# Patient Record
Sex: Female | Born: 1955
Health system: Southern US, Community
[De-identification: ages and names within clinical notes are randomized; demographics above are authoritative.]

## PROBLEM LIST (undated history)

## (undated) DIAGNOSIS — R350 Frequency of micturition: Secondary | ICD-10-CM

## (undated) DIAGNOSIS — M797 Fibromyalgia: Secondary | ICD-10-CM

## (undated) DIAGNOSIS — I499 Cardiac arrhythmia, unspecified: Secondary | ICD-10-CM

## (undated) DIAGNOSIS — G4730 Sleep apnea, unspecified: Secondary | ICD-10-CM

## (undated) DIAGNOSIS — Z860100 Personal history of colon polyps, unspecified: Secondary | ICD-10-CM

## (undated) DIAGNOSIS — N182 Chronic kidney disease, stage 2 (mild): Secondary | ICD-10-CM

## (undated) DIAGNOSIS — M2550 Pain in unspecified joint: Secondary | ICD-10-CM

## (undated) DIAGNOSIS — T148XXA Other injury of unspecified body region, initial encounter: Secondary | ICD-10-CM

## (undated) DIAGNOSIS — Z9884 Bariatric surgery status: Secondary | ICD-10-CM

## (undated) DIAGNOSIS — F419 Anxiety disorder, unspecified: Secondary | ICD-10-CM

## (undated) DIAGNOSIS — I509 Heart failure, unspecified: Secondary | ICD-10-CM

## (undated) DIAGNOSIS — G4700 Insomnia, unspecified: Secondary | ICD-10-CM

## (undated) DIAGNOSIS — Z8709 Personal history of other diseases of the respiratory system: Secondary | ICD-10-CM

## (undated) DIAGNOSIS — E119 Type 2 diabetes mellitus without complications: Secondary | ICD-10-CM

## (undated) DIAGNOSIS — M1990 Unspecified osteoarthritis, unspecified site: Secondary | ICD-10-CM

## (undated) DIAGNOSIS — G629 Polyneuropathy, unspecified: Secondary | ICD-10-CM

## (undated) DIAGNOSIS — M6289 Other specified disorders of muscle: Secondary | ICD-10-CM

## (undated) DIAGNOSIS — T7840XA Allergy, unspecified, initial encounter: Secondary | ICD-10-CM

## (undated) DIAGNOSIS — R011 Cardiac murmur, unspecified: Secondary | ICD-10-CM

## (undated) DIAGNOSIS — D649 Anemia, unspecified: Secondary | ICD-10-CM

## (undated) DIAGNOSIS — M549 Dorsalgia, unspecified: Secondary | ICD-10-CM

## (undated) DIAGNOSIS — M2540 Effusion, unspecified joint: Secondary | ICD-10-CM

## (undated) DIAGNOSIS — K8020 Calculus of gallbladder without cholecystitis without obstruction: Secondary | ICD-10-CM

## (undated) DIAGNOSIS — F329 Major depressive disorder, single episode, unspecified: Secondary | ICD-10-CM

## (undated) DIAGNOSIS — J45909 Unspecified asthma, uncomplicated: Secondary | ICD-10-CM

## (undated) DIAGNOSIS — F32A Depression, unspecified: Secondary | ICD-10-CM

## (undated) DIAGNOSIS — R3915 Urgency of urination: Secondary | ICD-10-CM

## (undated) DIAGNOSIS — T4145XA Adverse effect of unspecified anesthetic, initial encounter: Secondary | ICD-10-CM

## (undated) DIAGNOSIS — I10 Essential (primary) hypertension: Secondary | ICD-10-CM

## (undated) DIAGNOSIS — E559 Vitamin D deficiency, unspecified: Secondary | ICD-10-CM

## (undated) DIAGNOSIS — T8859XA Other complications of anesthesia, initial encounter: Secondary | ICD-10-CM

## (undated) DIAGNOSIS — K219 Gastro-esophageal reflux disease without esophagitis: Secondary | ICD-10-CM

## (undated) DIAGNOSIS — J189 Pneumonia, unspecified organism: Secondary | ICD-10-CM

## (undated) DIAGNOSIS — R809 Proteinuria, unspecified: Secondary | ICD-10-CM

## (undated) DIAGNOSIS — I82409 Acute embolism and thrombosis of unspecified deep veins of unspecified lower extremity: Secondary | ICD-10-CM

## (undated) DIAGNOSIS — G8929 Other chronic pain: Secondary | ICD-10-CM

## (undated) HISTORY — DX: Essential (primary) hypertension: I10

## (undated) HISTORY — DX: Vitamin D deficiency, unspecified: E55.9

## (undated) HISTORY — DX: Type 2 diabetes mellitus without complications: E11.9

## (undated) HISTORY — DX: Fibromyalgia: M79.7

## (undated) HISTORY — DX: Anxiety disorder, unspecified: F41.9

## (undated) HISTORY — DX: Calculus of gallbladder without cholecystitis without obstruction: K80.20

## (undated) HISTORY — PX: TONSILLECTOMY: SUR1361

## (undated) HISTORY — DX: Depression, unspecified: F32.A

## (undated) HISTORY — DX: Allergy, unspecified, initial encounter: T78.40XA

## (undated) HISTORY — DX: Proteinuria, unspecified: R80.9

## (undated) HISTORY — DX: Bariatric surgery status: Z98.84

## (undated) HISTORY — PX: FRACTURE SURGERY: SHX138

## (undated) HISTORY — DX: Other specified disorders of muscle: M62.89

## (undated) HISTORY — PX: SPINE SURGERY: SHX786

## (undated) HISTORY — PX: COLONOSCOPY: SHX174

## (undated) HISTORY — PX: DILATION AND CURETTAGE OF UTERUS: SHX78

## (undated) HISTORY — DX: Major depressive disorder, single episode, unspecified: F32.9

## (undated) HISTORY — DX: Chronic kidney disease, stage 2 (mild): N18.2

---

## 1985-04-19 HISTORY — PX: LIGAMENT REPAIR: SHX5444

## 1998-02-24 ENCOUNTER — Encounter: Payer: Self-pay | Admitting: Internal Medicine

## 1998-02-24 ENCOUNTER — Ambulatory Visit (HOSPITAL_COMMUNITY): Admission: RE | Admit: 1998-02-24 | Discharge: 1998-02-24 | Payer: Self-pay | Admitting: Internal Medicine

## 1998-04-04 ENCOUNTER — Encounter: Payer: Self-pay | Admitting: Obstetrics and Gynecology

## 1998-04-08 ENCOUNTER — Ambulatory Visit (HOSPITAL_COMMUNITY): Admission: RE | Admit: 1998-04-08 | Discharge: 1998-04-08 | Payer: Self-pay | Admitting: Obstetrics and Gynecology

## 1998-04-08 ENCOUNTER — Encounter: Payer: Self-pay | Admitting: Obstetrics and Gynecology

## 1999-11-18 ENCOUNTER — Other Ambulatory Visit: Admission: RE | Admit: 1999-11-18 | Discharge: 1999-11-18 | Payer: Self-pay | Admitting: Obstetrics and Gynecology

## 2000-01-08 ENCOUNTER — Ambulatory Visit: Admission: RE | Admit: 2000-01-08 | Discharge: 2000-01-08 | Payer: Self-pay | Admitting: Internal Medicine

## 2000-03-03 ENCOUNTER — Ambulatory Visit (HOSPITAL_COMMUNITY): Admission: RE | Admit: 2000-03-03 | Discharge: 2000-03-03 | Payer: Self-pay | Admitting: Internal Medicine

## 2000-03-05 ENCOUNTER — Emergency Department (HOSPITAL_COMMUNITY): Admission: EM | Admit: 2000-03-05 | Discharge: 2000-03-05 | Payer: Self-pay | Admitting: Emergency Medicine

## 2000-03-05 ENCOUNTER — Encounter: Payer: Self-pay | Admitting: Emergency Medicine

## 2000-05-20 ENCOUNTER — Other Ambulatory Visit: Admission: RE | Admit: 2000-05-20 | Discharge: 2000-05-20 | Payer: Self-pay | Admitting: Obstetrics and Gynecology

## 2000-08-15 ENCOUNTER — Ambulatory Visit (HOSPITAL_BASED_OUTPATIENT_CLINIC_OR_DEPARTMENT_OTHER): Admission: RE | Admit: 2000-08-15 | Discharge: 2000-08-15 | Payer: Self-pay | Admitting: Internal Medicine

## 2000-12-10 ENCOUNTER — Emergency Department (HOSPITAL_COMMUNITY): Admission: EM | Admit: 2000-12-10 | Discharge: 2000-12-11 | Payer: Self-pay | Admitting: Emergency Medicine

## 2000-12-10 ENCOUNTER — Encounter: Payer: Self-pay | Admitting: Emergency Medicine

## 2001-01-26 ENCOUNTER — Other Ambulatory Visit: Admission: RE | Admit: 2001-01-26 | Discharge: 2001-01-26 | Payer: Self-pay | Admitting: Internal Medicine

## 2001-03-10 ENCOUNTER — Encounter: Admission: RE | Admit: 2001-03-10 | Discharge: 2001-06-08 | Payer: Self-pay | Admitting: Internal Medicine

## 2001-04-19 DIAGNOSIS — Z9884 Bariatric surgery status: Secondary | ICD-10-CM

## 2001-04-19 HISTORY — DX: Bariatric surgery status: Z98.84

## 2001-06-27 ENCOUNTER — Ambulatory Visit: Admission: RE | Admit: 2001-06-27 | Discharge: 2001-06-27 | Payer: Self-pay | Admitting: Internal Medicine

## 2001-08-11 ENCOUNTER — Ambulatory Visit (HOSPITAL_COMMUNITY): Admission: RE | Admit: 2001-08-11 | Discharge: 2001-08-11 | Payer: Self-pay | Admitting: *Deleted

## 2001-08-14 ENCOUNTER — Ambulatory Visit (HOSPITAL_COMMUNITY): Admission: RE | Admit: 2001-08-14 | Discharge: 2001-08-14 | Payer: Self-pay | Admitting: *Deleted

## 2001-08-22 ENCOUNTER — Encounter: Payer: Self-pay | Admitting: Gastroenterology

## 2001-08-22 ENCOUNTER — Encounter: Admission: RE | Admit: 2001-08-22 | Discharge: 2001-08-22 | Payer: Self-pay | Admitting: Gastroenterology

## 2001-09-27 ENCOUNTER — Observation Stay (HOSPITAL_COMMUNITY): Admission: RE | Admit: 2001-09-27 | Discharge: 2001-09-28 | Payer: Self-pay | Admitting: *Deleted

## 2001-09-27 ENCOUNTER — Encounter (INDEPENDENT_AMBULATORY_CARE_PROVIDER_SITE_OTHER): Payer: Self-pay | Admitting: Specialist

## 2001-10-24 ENCOUNTER — Encounter: Payer: Self-pay | Admitting: General Surgery

## 2001-10-24 ENCOUNTER — Emergency Department (HOSPITAL_COMMUNITY): Admission: EM | Admit: 2001-10-24 | Discharge: 2001-10-24 | Payer: Self-pay | Admitting: Emergency Medicine

## 2002-04-19 HISTORY — PX: OTHER SURGICAL HISTORY: SHX169

## 2003-04-20 HISTORY — PX: HERNIA REPAIR: SHX51

## 2003-10-20 ENCOUNTER — Emergency Department (HOSPITAL_COMMUNITY): Admission: EM | Admit: 2003-10-20 | Discharge: 2003-10-20 | Payer: Self-pay | Admitting: Emergency Medicine

## 2004-08-14 ENCOUNTER — Ambulatory Visit: Payer: Self-pay | Admitting: Internal Medicine

## 2005-02-16 ENCOUNTER — Ambulatory Visit: Payer: Self-pay | Admitting: Internal Medicine

## 2005-03-16 ENCOUNTER — Ambulatory Visit: Payer: Self-pay | Admitting: Internal Medicine

## 2005-03-18 ENCOUNTER — Ambulatory Visit: Payer: Self-pay | Admitting: Cardiology

## 2005-04-19 HISTORY — PX: CHOLECYSTECTOMY: SHX55

## 2006-02-11 ENCOUNTER — Emergency Department (HOSPITAL_COMMUNITY): Admission: EM | Admit: 2006-02-11 | Discharge: 2006-02-11 | Payer: Self-pay | Admitting: Emergency Medicine

## 2006-02-18 ENCOUNTER — Ambulatory Visit: Payer: Self-pay | Admitting: Internal Medicine

## 2006-02-18 LAB — CONVERTED CEMR LAB
ALT: 31 units/L (ref 0–40)
AST: 24 units/L (ref 0–37)
Albumin: 3.4 g/dL — ABNORMAL LOW (ref 3.5–5.2)
Alkaline Phosphatase: 93 units/L (ref 39–117)
BUN: 6 mg/dL (ref 6–23)
Basophils Absolute: 0 10*3/uL (ref 0.0–0.1)
Basophils Relative: 0.3 % (ref 0.0–1.0)
Bilirubin, Direct: 0.1 mg/dL (ref 0.0–0.3)
CO2: 25 meq/L (ref 19–32)
Calcium: 8.1 mg/dL — ABNORMAL LOW (ref 8.4–10.5)
Chloride: 110 meq/L (ref 96–112)
Chol/HDL Ratio, serum: 2.2
Cholesterol: 126 mg/dL (ref 0–200)
Creatinine, Ser: 0.6 mg/dL (ref 0.4–1.2)
Eosinophil percent: 2.6 % (ref 0.0–5.0)
GFR calc non Af Amer: 112 mL/min
Glomerular Filtration Rate, Af Am: 135 mL/min/{1.73_m2}
Glucose, Bld: 87 mg/dL (ref 70–99)
HCT: 41.6 % (ref 36.0–46.0)
HDL: 56.9 mg/dL (ref >39.0)
Hemoglobin: 13.6 g/dL (ref 12.0–15.0)
LDL Cholesterol: 55 mg/dL (ref 0–99)
Lymphocytes Relative: 36.5 % (ref 12.0–46.0)
MCHC: 32.8 g/dL (ref 30.0–36.0)
MCV: 86.5 fL (ref 78.0–100.0)
Monocytes Absolute: 0.5 10*3/uL (ref 0.2–0.7)
Monocytes Relative: 8.8 % (ref 3.0–11.0)
Neutro Abs: 3.1 10*3/uL (ref 1.4–7.7)
Neutrophils Relative %: 51.8 % (ref 43.0–77.0)
Platelets: 263 10*3/uL (ref 150–400)
Potassium: 3.4 meq/L — ABNORMAL LOW (ref 3.5–5.1)
RBC: 4.81 M/uL (ref 3.87–5.11)
RDW: 15.1 % — ABNORMAL HIGH (ref 11.5–14.6)
Sodium: 141 meq/L (ref 135–145)
Total Bilirubin: 0.5 mg/dL (ref 0.3–1.2)
Total Protein: 6.4 g/dL (ref 6.0–8.3)
Triglyceride fasting, serum: 70 mg/dL (ref 0–149)
VLDL: 14 mg/dL (ref 0–40)
WBC: 6 10*3/uL (ref 4.5–10.5)

## 2006-09-05 ENCOUNTER — Ambulatory Visit: Payer: Self-pay | Admitting: Internal Medicine

## 2006-09-06 LAB — CONVERTED CEMR LAB
ALT: 18 units/L (ref 0–40)
AST: 20 units/L (ref 0–37)
Albumin: 3.4 g/dL — ABNORMAL LOW (ref 3.5–5.2)
Alkaline Phosphatase: 145 units/L — ABNORMAL HIGH (ref 39–117)
Amylase: 83 units/L (ref 27–131)
BUN: 8 mg/dL (ref 6–23)
Basophils Absolute: 0 10*3/uL (ref 0.0–0.1)
Basophils Relative: 0.4 % (ref 0.0–1.0)
Bilirubin, Direct: 0.1 mg/dL (ref 0.0–0.3)
CO2: 21 meq/L (ref 19–32)
Calcium: 8 mg/dL — ABNORMAL LOW (ref 8.4–10.5)
Chloride: 116 meq/L — ABNORMAL HIGH (ref 96–112)
Creatinine, Ser: 0.4 mg/dL (ref 0.4–1.2)
Eosinophils Absolute: 0.1 10*3/uL (ref 0.0–0.6)
Eosinophils Relative: 2 % (ref 0.0–5.0)
GFR calc Af Amer: 216 mL/min
GFR calc non Af Amer: 178 mL/min
Glucose, Bld: 73 mg/dL (ref 70–99)
HCT: 36.8 % (ref 36.0–46.0)
Hemoglobin: 12.8 g/dL (ref 12.0–15.0)
Lymphocytes Relative: 40.3 % (ref 12.0–46.0)
MCHC: 34.7 g/dL (ref 30.0–36.0)
MCV: 80.6 fL (ref 78.0–100.0)
Monocytes Absolute: 0.3 10*3/uL (ref 0.2–0.7)
Monocytes Relative: 4.6 % (ref 3.0–11.0)
Neutro Abs: 3.4 10*3/uL (ref 1.4–7.7)
Neutrophils Relative %: 52.7 % (ref 43.0–77.0)
Platelets: 251 10*3/uL (ref 150–400)
Potassium: 3.3 meq/L — ABNORMAL LOW (ref 3.5–5.1)
RBC: 4.57 M/uL (ref 3.87–5.11)
RDW: 15 % — ABNORMAL HIGH (ref 11.5–14.6)
Sodium: 141 meq/L (ref 135–145)
Total Bilirubin: 0.4 mg/dL (ref 0.3–1.2)
Total Protein: 6.1 g/dL (ref 6.0–8.3)
WBC: 6.3 10*3/uL (ref 4.5–10.5)

## 2006-09-08 ENCOUNTER — Encounter: Admission: RE | Admit: 2006-09-08 | Discharge: 2006-09-08 | Payer: Self-pay | Admitting: Gastroenterology

## 2006-09-19 ENCOUNTER — Ambulatory Visit (HOSPITAL_COMMUNITY): Admission: RE | Admit: 2006-09-19 | Discharge: 2006-09-19 | Payer: Self-pay | Admitting: Gastroenterology

## 2006-09-19 ENCOUNTER — Encounter (INDEPENDENT_AMBULATORY_CARE_PROVIDER_SITE_OTHER): Payer: Self-pay | Admitting: Gastroenterology

## 2006-09-22 ENCOUNTER — Encounter: Admission: RE | Admit: 2006-09-22 | Discharge: 2006-09-22 | Payer: Self-pay | Admitting: Gastroenterology

## 2006-10-19 ENCOUNTER — Ambulatory Visit (HOSPITAL_COMMUNITY): Admission: RE | Admit: 2006-10-19 | Discharge: 2006-10-19 | Payer: Self-pay | Admitting: Gastroenterology

## 2006-10-20 DIAGNOSIS — F411 Generalized anxiety disorder: Secondary | ICD-10-CM | POA: Insufficient documentation

## 2006-10-20 DIAGNOSIS — J45909 Unspecified asthma, uncomplicated: Secondary | ICD-10-CM | POA: Insufficient documentation

## 2006-10-20 DIAGNOSIS — Z8639 Personal history of other endocrine, nutritional and metabolic disease: Secondary | ICD-10-CM | POA: Insufficient documentation

## 2006-10-20 DIAGNOSIS — Z86718 Personal history of other venous thrombosis and embolism: Secondary | ICD-10-CM | POA: Insufficient documentation

## 2006-10-20 DIAGNOSIS — F32A Depression, unspecified: Secondary | ICD-10-CM | POA: Insufficient documentation

## 2006-10-20 DIAGNOSIS — M1990 Unspecified osteoarthritis, unspecified site: Secondary | ICD-10-CM | POA: Insufficient documentation

## 2006-10-20 DIAGNOSIS — G4733 Obstructive sleep apnea (adult) (pediatric): Secondary | ICD-10-CM | POA: Insufficient documentation

## 2006-10-20 DIAGNOSIS — F329 Major depressive disorder, single episode, unspecified: Secondary | ICD-10-CM | POA: Insufficient documentation

## 2006-10-27 ENCOUNTER — Ambulatory Visit: Payer: Self-pay | Admitting: Internal Medicine

## 2006-11-21 ENCOUNTER — Telehealth: Payer: Self-pay | Admitting: Internal Medicine

## 2006-11-22 ENCOUNTER — Telehealth: Payer: Self-pay | Admitting: Internal Medicine

## 2006-12-02 ENCOUNTER — Ambulatory Visit: Payer: Self-pay | Admitting: Internal Medicine

## 2006-12-02 DIAGNOSIS — R6889 Other general symptoms and signs: Secondary | ICD-10-CM | POA: Insufficient documentation

## 2006-12-02 DIAGNOSIS — M79609 Pain in unspecified limb: Secondary | ICD-10-CM | POA: Insufficient documentation

## 2006-12-04 DIAGNOSIS — I10 Essential (primary) hypertension: Secondary | ICD-10-CM | POA: Insufficient documentation

## 2006-12-04 LAB — CONVERTED CEMR LAB
ALT: 25 units/L (ref 0–35)
AST: 27 units/L (ref 0–37)
Albumin: 3.5 g/dL (ref 3.5–5.2)
Alkaline Phosphatase: 131 units/L — ABNORMAL HIGH (ref 39–117)
BUN: 9 mg/dL (ref 6–23)
Basophils Absolute: 0 10*3/uL (ref 0.0–0.1)
Basophils Relative: 0.7 % (ref 0.0–1.0)
Bilirubin, Direct: 0.1 mg/dL (ref 0.0–0.3)
CO2: 25 meq/L (ref 19–32)
Calcium: 8.1 mg/dL — ABNORMAL LOW (ref 8.4–10.5)
Chloride: 115 meq/L — ABNORMAL HIGH (ref 96–112)
Creatinine, Ser: 0.5 mg/dL (ref 0.4–1.2)
Eosinophils Absolute: 0.1 10*3/uL (ref 0.0–0.6)
Eosinophils Relative: 1.2 % (ref 0.0–5.0)
GFR calc Af Amer: 166 mL/min
GFR calc non Af Amer: 137 mL/min
Glucose, Bld: 81 mg/dL (ref 70–99)
HCT: 36.8 % (ref 36.0–46.0)
Hemoglobin: 12.8 g/dL (ref 12.0–15.0)
Lymphocytes Relative: 33.9 % (ref 12.0–46.0)
MCHC: 34.6 g/dL (ref 30.0–36.0)
MCV: 81 fL (ref 78.0–100.0)
Monocytes Absolute: 0.5 10*3/uL (ref 0.2–0.7)
Monocytes Relative: 7.2 % (ref 3.0–11.0)
Neutro Abs: 3.8 10*3/uL (ref 1.4–7.7)
Neutrophils Relative %: 57 % (ref 43.0–77.0)
Platelets: 266 10*3/uL (ref 150–400)
Potassium: 3.6 meq/L (ref 3.5–5.1)
RBC: 4.55 M/uL (ref 3.87–5.11)
RDW: 16.3 % — ABNORMAL HIGH (ref 11.5–14.6)
Sodium: 144 meq/L (ref 135–145)
TSH: 0.82 microintl units/mL (ref 0.35–5.50)
Total Bilirubin: 0.5 mg/dL (ref 0.3–1.2)
Total Protein: 6.2 g/dL (ref 6.0–8.3)
WBC: 6.6 10*3/uL (ref 4.5–10.5)

## 2006-12-05 ENCOUNTER — Telehealth: Payer: Self-pay | Admitting: Internal Medicine

## 2006-12-07 ENCOUNTER — Encounter: Payer: Self-pay | Admitting: Internal Medicine

## 2006-12-07 ENCOUNTER — Ambulatory Visit: Payer: Self-pay

## 2006-12-28 ENCOUNTER — Ambulatory Visit: Payer: Self-pay | Admitting: Internal Medicine

## 2006-12-28 DIAGNOSIS — D235 Other benign neoplasm of skin of trunk: Secondary | ICD-10-CM | POA: Insufficient documentation

## 2007-01-11 ENCOUNTER — Ambulatory Visit: Payer: Self-pay | Admitting: Internal Medicine

## 2007-02-01 ENCOUNTER — Ambulatory Visit: Payer: Self-pay | Admitting: Internal Medicine

## 2007-03-06 ENCOUNTER — Telehealth: Payer: Self-pay | Admitting: Internal Medicine

## 2007-03-07 ENCOUNTER — Ambulatory Visit: Payer: Self-pay | Admitting: Internal Medicine

## 2007-03-07 DIAGNOSIS — R0609 Other forms of dyspnea: Secondary | ICD-10-CM | POA: Insufficient documentation

## 2007-03-07 DIAGNOSIS — R0989 Other specified symptoms and signs involving the circulatory and respiratory systems: Secondary | ICD-10-CM

## 2007-03-10 ENCOUNTER — Telehealth (INDEPENDENT_AMBULATORY_CARE_PROVIDER_SITE_OTHER): Payer: Self-pay | Admitting: *Deleted

## 2007-03-14 ENCOUNTER — Ambulatory Visit: Payer: Self-pay | Admitting: Internal Medicine

## 2007-03-14 ENCOUNTER — Encounter: Payer: Self-pay | Admitting: Internal Medicine

## 2007-03-14 ENCOUNTER — Ambulatory Visit: Payer: Self-pay

## 2007-07-25 ENCOUNTER — Ambulatory Visit: Payer: Self-pay | Admitting: Internal Medicine

## 2007-07-25 LAB — CONVERTED CEMR LAB
Ketones, urine, test strip: NEGATIVE
Nitrite: NEGATIVE
Urobilinogen, UA: 0.2
WBC Urine, dipstick: NEGATIVE

## 2007-10-12 ENCOUNTER — Ambulatory Visit: Payer: Self-pay | Admitting: Internal Medicine

## 2007-10-12 DIAGNOSIS — J069 Acute upper respiratory infection, unspecified: Secondary | ICD-10-CM | POA: Insufficient documentation

## 2007-11-27 ENCOUNTER — Emergency Department (HOSPITAL_COMMUNITY): Admission: EM | Admit: 2007-11-27 | Discharge: 2007-11-28 | Payer: Self-pay | Admitting: Emergency Medicine

## 2008-12-29 ENCOUNTER — Emergency Department (HOSPITAL_COMMUNITY): Admission: EM | Admit: 2008-12-29 | Discharge: 2008-12-29 | Payer: Self-pay | Admitting: Emergency Medicine

## 2009-04-07 ENCOUNTER — Encounter: Admission: RE | Admit: 2009-04-07 | Discharge: 2009-04-07 | Payer: Self-pay | Admitting: Cardiology

## 2009-04-19 LAB — HM COLONOSCOPY: HM Colonoscopy: NORMAL

## 2009-06-19 ENCOUNTER — Emergency Department (HOSPITAL_COMMUNITY): Admission: EM | Admit: 2009-06-19 | Discharge: 2009-06-19 | Payer: Self-pay | Admitting: Emergency Medicine

## 2009-09-04 ENCOUNTER — Emergency Department (HOSPITAL_COMMUNITY): Admission: EM | Admit: 2009-09-04 | Discharge: 2009-09-04 | Payer: Self-pay | Admitting: Family Medicine

## 2009-11-12 ENCOUNTER — Emergency Department (HOSPITAL_COMMUNITY): Admission: EM | Admit: 2009-11-12 | Discharge: 2009-11-12 | Payer: Self-pay | Admitting: Family Medicine

## 2010-02-17 ENCOUNTER — Emergency Department (HOSPITAL_COMMUNITY)
Admission: EM | Admit: 2010-02-17 | Discharge: 2010-02-17 | Payer: Self-pay | Source: Home / Self Care | Admitting: Family Medicine

## 2010-02-23 ENCOUNTER — Ambulatory Visit (HOSPITAL_COMMUNITY): Admission: RE | Admit: 2010-02-23 | Discharge: 2010-02-23 | Payer: Self-pay | Admitting: Family Medicine

## 2010-04-05 ENCOUNTER — Emergency Department (HOSPITAL_COMMUNITY)
Admission: EM | Admit: 2010-04-05 | Discharge: 2010-04-05 | Payer: Self-pay | Source: Home / Self Care | Admitting: Emergency Medicine

## 2010-04-24 ENCOUNTER — Encounter
Admission: RE | Admit: 2010-04-24 | Discharge: 2010-04-24 | Payer: Self-pay | Source: Home / Self Care | Attending: Neurosurgery | Admitting: Neurosurgery

## 2010-06-05 ENCOUNTER — Other Ambulatory Visit (HOSPITAL_COMMUNITY): Payer: Self-pay | Admitting: Family Medicine

## 2010-06-05 DIAGNOSIS — R531 Weakness: Secondary | ICD-10-CM

## 2010-06-05 DIAGNOSIS — R159 Full incontinence of feces: Secondary | ICD-10-CM

## 2010-06-10 ENCOUNTER — Encounter: Payer: Self-pay | Admitting: Family Medicine

## 2010-06-10 ENCOUNTER — Observation Stay (HOSPITAL_COMMUNITY)
Admission: EM | Admit: 2010-06-10 | Discharge: 2010-06-11 | Disposition: A | Discharge status: Home / Self Care | Payer: Medicaid Other | Attending: Family Medicine | Admitting: Family Medicine

## 2010-06-10 ENCOUNTER — Emergency Department (HOSPITAL_COMMUNITY): Payer: Medicaid Other

## 2010-06-10 DIAGNOSIS — R29898 Other symptoms and signs involving the musculoskeletal system: Secondary | ICD-10-CM | POA: Insufficient documentation

## 2010-06-10 DIAGNOSIS — I10 Essential (primary) hypertension: Secondary | ICD-10-CM

## 2010-06-10 DIAGNOSIS — E876 Hypokalemia: Secondary | ICD-10-CM | POA: Insufficient documentation

## 2010-06-10 DIAGNOSIS — R0789 Other chest pain: Secondary | ICD-10-CM

## 2010-06-10 DIAGNOSIS — D649 Anemia, unspecified: Secondary | ICD-10-CM | POA: Insufficient documentation

## 2010-06-10 DIAGNOSIS — R0989 Other specified symptoms and signs involving the circulatory and respiratory systems: Secondary | ICD-10-CM | POA: Insufficient documentation

## 2010-06-10 DIAGNOSIS — F3289 Other specified depressive episodes: Secondary | ICD-10-CM

## 2010-06-10 DIAGNOSIS — G894 Chronic pain syndrome: Secondary | ICD-10-CM | POA: Insufficient documentation

## 2010-06-10 DIAGNOSIS — D638 Anemia in other chronic diseases classified elsewhere: Secondary | ICD-10-CM

## 2010-06-10 DIAGNOSIS — R0602 Shortness of breath: Secondary | ICD-10-CM | POA: Insufficient documentation

## 2010-06-10 DIAGNOSIS — F329 Major depressive disorder, single episode, unspecified: Secondary | ICD-10-CM | POA: Insufficient documentation

## 2010-06-10 DIAGNOSIS — R0609 Other forms of dyspnea: Secondary | ICD-10-CM | POA: Insufficient documentation

## 2010-06-10 DIAGNOSIS — Z86718 Personal history of other venous thrombosis and embolism: Secondary | ICD-10-CM | POA: Insufficient documentation

## 2010-06-10 LAB — DIFFERENTIAL
Basophils Absolute: 0 10*3/uL (ref 0.0–0.1)
Basophils Relative: 0 % (ref 0–1)
Eosinophils Absolute: 0.1 10*3/uL (ref 0.0–0.7)
Eosinophils Relative: 2 % (ref 0–5)
Neutrophils Relative %: 41 % — ABNORMAL LOW (ref 43–77)

## 2010-06-10 LAB — CBC
MCV: 84.8 fL (ref 78.0–100.0)
Platelets: 265 10*3/uL (ref 150–400)
RBC: 3.81 MIL/uL — ABNORMAL LOW (ref 3.87–5.11)
RDW: 16.7 % — ABNORMAL HIGH (ref 11.5–15.5)
WBC: 5 10*3/uL (ref 4.0–10.5)

## 2010-06-10 LAB — POCT CARDIAC MARKERS
CKMB, poc: <1 ng/mL — ABNORMAL LOW (ref 1.0–8.0)
Myoglobin, poc: 43.4 ng/mL (ref 12–200)
Troponin i, poc: <0.05 ng/mL (ref 0.00–0.09)

## 2010-06-10 LAB — CARDIAC PANEL(CRET KIN+CKTOT+MB+TROPI)
CK, MB: 1.2 ng/mL (ref 0.3–4.0)
Total CK: 62 U/L (ref 7–177)
Troponin I: <0.01 ng/mL (ref 0.00–0.06)

## 2010-06-10 LAB — BASIC METABOLIC PANEL
Chloride: 117 mEq/L — ABNORMAL HIGH (ref 96–112)
GFR calc non Af Amer: >60 mL/min (ref >60)
Glucose, Bld: 90 mg/dL (ref 70–99)
Potassium: 3.3 mEq/L — ABNORMAL LOW (ref 3.5–5.1)
Sodium: 144 mEq/L (ref 135–145)

## 2010-06-10 LAB — CARBOXYHEMOGLOBIN
Carboxyhemoglobin: 0.6 % (ref 0.5–1.5)
Total hemoglobin: 10.5 g/dL — ABNORMAL LOW (ref 12.5–16.0)

## 2010-06-10 LAB — APTT: aPTT: 29 seconds (ref 24–37)

## 2010-06-10 LAB — CK TOTAL AND CKMB (NOT AT ARMC): CK, MB: 1.3 ng/mL (ref 0.3–4.0)

## 2010-06-10 LAB — SEDIMENTATION RATE: Sed Rate: 15 mm/hr (ref 0–22)

## 2010-06-10 LAB — TROPONIN I: Troponin I: 0.01 ng/mL (ref 0.00–0.06)

## 2010-06-10 LAB — RETICULOCYTES: Retic Count, Absolute: 38.1 10*3/uL (ref 19.0–186.0)

## 2010-06-11 LAB — LIPID PANEL
Cholesterol: 133 mg/dL (ref 0–200)
LDL Cholesterol: 59 mg/dL (ref 0–99)

## 2010-06-11 LAB — URINE MICROSCOPIC-ADD ON

## 2010-06-11 LAB — URINALYSIS, ROUTINE W REFLEX MICROSCOPIC
Bilirubin Urine: NEGATIVE
Nitrite: NEGATIVE
Specific Gravity, Urine: 1.014 (ref 1.005–1.030)
Urobilinogen, UA: 1 mg/dL (ref 0.0–1.0)

## 2010-06-11 LAB — TSH: TSH: 1.08 u[IU]/mL (ref 0.350–4.500)

## 2010-06-11 LAB — BASIC METABOLIC PANEL
Calcium: 7.5 mg/dL — ABNORMAL LOW (ref 8.4–10.5)
Creatinine, Ser: 0.48 mg/dL (ref 0.4–1.2)
GFR calc Af Amer: >60 mL/min (ref >60)
GFR calc non Af Amer: >60 mL/min (ref >60)

## 2010-06-11 LAB — IRON AND TIBC: Saturation Ratios: 8 % — ABNORMAL LOW (ref 20–55)

## 2010-06-11 LAB — CARDIAC PANEL(CRET KIN+CKTOT+MB+TROPI)
CK, MB: 1.4 ng/mL (ref 0.3–4.0)
Total CK: 60 U/L (ref 7–177)

## 2010-06-11 LAB — CBC
MCH: 28 pg (ref 26.0–34.0)
MCHC: 32.6 g/dL (ref 30.0–36.0)
Platelets: 249 10*3/uL (ref 150–400)
RBC: 3.71 MIL/uL — ABNORMAL LOW (ref 3.87–5.11)

## 2010-06-11 LAB — VITAMIN B12: Vitamin B-12: 549 pg/mL (ref 211–911)

## 2010-06-12 ENCOUNTER — Ambulatory Visit (HOSPITAL_COMMUNITY)
Admission: RE | Admit: 2010-06-12 | Discharge: 2010-06-12 | Disposition: A | Discharge status: Home / Self Care | Payer: Self-pay | Source: Ambulatory Visit | Attending: Family Medicine | Admitting: Family Medicine

## 2010-06-12 DIAGNOSIS — R531 Weakness: Secondary | ICD-10-CM

## 2010-06-12 DIAGNOSIS — R159 Full incontinence of feces: Secondary | ICD-10-CM

## 2010-06-16 NOTE — Discharge Summary (Signed)
Jessica, [name] NO.:  [id number]  MEDICAL RECORD NO.:  [id number]           PATIENT TYPE:  I  LOCATION:  3736                         FACILITY:  [facility]  PHYSICIAN:  [name], M.D.DATE OF BIRTH:  April 21, 1955  DATE OF ADMISSION:  06/10/2010 DATE OF DISCHARGE:  06/11/2010                              DISCHARGE SUMMARY   PRIMARY CARE PROVIDER:  Maurice March, MD, at [facility]  DISCHARGE DIAGNOSES: 1. Noncardiac chest pain, likely a musculoskeletal origin. 2. Hypertension. 3. Depression. 4. Muscle weakness. 5. Chronic pain syndrome. 6. Left leg deep venous thrombosis in 2002 status post inferior vena     cava filter. 7. Chronic anemia.  DISCHARGE MEDICATIONS: 1. Gabapentin 100 mg by mouth three times a day. 2. Celexa 20 mg by mouth daily. 3. Ferrous sulfate 325 mg by mouth daily. 4. Lisinopril/hydrochlorothiazide 10/12.5 mg by mouth daily. 5. Ibuprofen 800 mg by mouth every 8 hours as need for pain. 6. Multivitamin one daily. 7. Percocet 5/325 one by mouth every 6 hours as needed for pain. 8. Robitussin cough syrup 10 mL every 4 hours as needed for cough.  LABORATORY DATA:  Labs on the date of admission are notable for white count of 5, hemoglobin 10.5, platelets 265.  Point-of-care troponins were unremarkable.  D-dimer was 0.42 which is negative at this institution.  Potassium was 3.3, chloride 117, basic metabolic panel was otherwise unremarkable.  Blood carbon monoxide level was 0.6 which is within normal range, reticulocyte count is 3.81.  ESR is 15.  Lipid panel demonstrated HDL 61, LDL 59.  Urinalysis demonstrated moderate leukocytes but was otherwise unremarkable.  Urine microscopic showed rare bacteria, 3-6 whites, and few epithelials.  Iron panel demonstrated iron level of 28, total iron-binding capacity of 369, percent saturation of 8.  Ferritin was 12.  Vitamin B12 was 549.  TSH was 1.080 and folate was 406.  IMAGING:   Chest x-ray performed on June 10, 2010, demonstrated mild bibasilar atelectasis but no other acute cardiopulmonary process.  CONSULTS:  None.  BRIEF HOSPITAL COURSE:  The patient is a 55 year old female who presented to the emergency department with complaints of shortness of breath, chest pain, and left shoulder pain. 1. Chest pain:  The patient was ruled out from a cardiac standpoint     with repeat EKG and cardiac enzymes.  The patient was also risk     stratified, and the patient's pain, on exam, was relatively     reproducible with palpation, and after she had been ruled out from     a cardiac standpoint, it was felt this was likely related to some     of her chronic pain issues or musculoskeletal.  Risk stratification     labs did not reveal any areas of concern. 2. Dyspnea:  The patient initially was complaining of dyspnea and had     some questionable exposure to carbon monoxide.  Carbon monoxide     level was obtained and was within normal limits.  The patient was     subsequently weaned to room air and was felt stable for discharge. 3. Deconditioning:  The patient has  a long and involved history of     gradually progressive weakness in all extremities.  It is unclear     whether or not this is a pain disorder spectrum process or a     primary neurologic process.  Workup is ongoing, and the patient was     scheduled for an MRI of the brain as an outpatient at [facility]     on June 12, 2010.  Accordingly, the patient was discharged and     ordered to make this appointment.  The patient was set up with home     health PT to help with her deconditioning.  Physical therapy did     recommend inpatient rehab placement; however, the patient did not     have insurance and was unable to afford this option.  DISCHARGE INSTRUCTIONS:  The patient was discharged home with instructions to increase activity slowly and walk with assistance and a low-sodium heart-healthy  diet.  ISSUES FOR FOLLOWUP:  None.  FOLLOWUP APPOINTMENTS: 1. [name] on June 12, 2010, at 8:45 in the morning. 2. [facility], the patient was instructed to call for an appointment     in 1-2 weeks. 3. [facility]:  The patient was instructed to call for followup     appointment with them on an as-needed basis.  DISCHARGE CONDITION:  The patient was discharged home in stable medical condition.    ______________________________ Jessica Homer, MD   ______________________________ [name], M.D.    ER/MEDQ  D:  06/15/2010  T:  06/15/2010  Job:  [id number]  cc:   [name], M.D.  Electronically Signed by Jessica Neptune MD on 06/16/2010 06:32:11 AM Electronically Signed by [name] M.D. on 06/16/2010 12:22:21 PM

## 2010-07-04 LAB — POCT URINALYSIS DIP (DEVICE)
Protein, ur: NEGATIVE mg/dL
Urobilinogen, UA: 0.2 mg/dL (ref 0.0–1.0)
pH: 5.5 (ref 5.0–8.0)

## 2010-07-13 LAB — POCT I-STAT, CHEM 8
Calcium, Ion: 0.98 mmol/L — ABNORMAL LOW (ref 1.12–1.32)
Creatinine, Ser: 0.3 mg/dL — ABNORMAL LOW (ref 0.4–1.2)
Glucose, Bld: 84 mg/dL (ref 70–99)
HCT: 39 % (ref 36.0–46.0)
Hemoglobin: 13.3 g/dL (ref 12.0–15.0)
Potassium: 5.2 mEq/L — ABNORMAL HIGH (ref 3.5–5.1)
TCO2: 21 mmol/L (ref 0–100)

## 2010-07-13 LAB — DIFFERENTIAL
Basophils Absolute: 0.1 10*3/uL (ref 0.0–0.1)
Eosinophils Absolute: 0.1 10*3/uL (ref 0.0–0.7)
Eosinophils Relative: 1 % (ref 0–5)
Lymphocytes Relative: 51 % — ABNORMAL HIGH (ref 12–46)
Lymphs Abs: 3.5 10*3/uL (ref 0.7–4.0)
Neutrophils Relative %: 41 % — ABNORMAL LOW (ref 43–77)

## 2010-07-13 LAB — CBC
MCV: 84.2 fL (ref 78.0–100.0)
Platelets: 283 10*3/uL (ref 150–400)
RDW: 17.5 % — ABNORMAL HIGH (ref 11.5–15.5)
WBC: 6.8 10*3/uL (ref 4.0–10.5)

## 2010-07-13 LAB — PROTIME-INR
INR: 1.09 (ref 0.00–1.49)
Prothrombin Time: 14 seconds (ref 11.6–15.2)

## 2010-07-13 LAB — POCT CARDIAC MARKERS: CKMB, poc: 1.1 ng/mL (ref 1.0–8.0)

## 2010-07-24 LAB — GLUCOSE, CAPILLARY

## 2010-08-07 NOTE — H&P (Signed)
[name], FOODY NO.:  [id number]  MEDICAL RECORD NO.:  [id number]           PATIENT TYPE:  E  LOCATION:  [facility]                         FACILITY:  [facility]  PHYSICIAN:  Jesse Sans, MD             DATE OF BIRTH:  09/19/1955  DATE OF ADMISSION:  06/10/2010 DATE OF DISCHARGE:                             HISTORY & PHYSICAL   PRIMARY CARE PHYSICIAN:  [facility], Maurice March, MD  CHIEF COMPLAINT:  Shortness of breath, chest pain, and left shoulder pain.  HISTORY OF PRESENT ILLNESS:  This is a 55 year old female who presented to the ED for progressive dyspnea, chest pain, and left shoulder pain that started on Sunday (4 days ago).  Earlier today, she was told by a Designer, multimedia that her house had a high level of carbon monoxide. Because the patient has been feeling weak and sick since Sunday, she called EMS to come to her house.  When EMS got there, her blood pressure was elevated and they felt that she should come to the ER to be examined.  REVIEW OF SYSTEMS:  Positive for nausea.  Negative for vomiting. Negative for fever.  Positive for chills.  Positive for headaches since 1 week.  Negative for wheezing.  Negative for constipation.  Negative diarrhea.  Negative dysuria.  Positive for urinary incontinence. Positive for muscle weakness, which is chronic in all extremities.  PAST MEDICAL HISTORY: 1. Hypertension. 2. Depression. 3. Muscle weakness. 4. Chronic pain syndrome. 5. Left leg DVT in 2002 status post IVC filter placement.  PAST SURGICAL HISTORY: 1. Gastric bypass in 2004. 2. Laparoscopic cholecystectomy in 2003. 3. Ventral hernia in 2005. 4. IVC filter placed in 2002.  MEDICATIONS: 1. Robitussin DM since Sunday. 2. Lisinopril the patient does not know the dose. 3. Percocet q.6 h. p.r.n. pain. 4. Motrin 800 mg p.o. p.r.n. pain. 5. Celexa, the patient does not know the dose.  ALLERGIES:  SULFA drugs.  The patient does not know  reaction.  FAMILY HISTORY: 1. Father with CHF, coronary artery disease status post myocardial     infarction 1 month ago.  Her father is 74 years old.  He also has     hypertension, prostate cancer. 20. An 56 year old mother with diabetes and hypertension. 3. Sister with hypertension. 4. Brother with hypertension and stage IV kidney cancer.  SOCIAL HISTORY:  The patient lives alone in [place].  Employment: She is a retired Museum/gallery curator.  Alcohol none.  Tobacco, quit 3 months ago.  She smokes one half-pack a day for 20 years.  Drugs none. Children, the patient has no children.  PHYSICAL EXAMINATION:  VITAL SIGNS:  Temp 97.3, pulse 61-68, respiratory 17-18 blood pressure 135/78, 96/52, and O2 sat 100% on room air. GENERAL:  No apparent distress.  Appropriate throughout exam. HEENT:  Normocephalic, atraumatic.  Extraocular motility intact.  Pupils are equal, round and reactive to light and accommodation.  Moist mucous membrane.  No neck masses. NECK:  With full range of motion. CHEST:  No palpable tenderness. CARDIOVASCULAR:  Regular rate and rhythm.  No murmurs, rubs, or gallops. S1, S2  soft. RESPIRATORY:  Clear to auscultation bilaterally.  No wheezing, rales, or rhonchi. ABDOMEN:  Obese.  Positive bowel sounds, nontender, nondistended. BACK:  Tenderness to deep palpation in mid back. EXTREMITIES:  No clubbing, cyanosis, or edema.  +2 pulses. NEUROLOGIC:  Strength is 4/5 in upper and lower extremities, otherwise nonfocal. SKIN:  Skin is dry and no rashes.  LABORATORY DATA/STUDIES: 1. Chest x-ray that showed mild bilateral atelectasis. 2. CBC that shows white blood cell 5.0, hemoglobin 10.5, hematocrit     32.3, platelet 265, ANC 2.1, and MCV of 84.4. 3. Point-of-care cardiac enzymes shows troponin less than 0.05, CK-MB     less than 1.0, and myoglobin 43.3. 4. INR 1.06. 5. D-dimer of 0.42. 6. BMET showed sodium of 144, potassium 3.3, chloride 117, CO2 of 21,     BUN  11, creatinine 0.49, glucose 90, and calcium 7.6. 7. EKG with sinus bradycardia with rate 56.  No ST wave abnormality.  ASSESSMENT/PLAN:  A 55 year old female with left-sided chest pain, dyspnea, and left shoulder pain. 1. Chest pain.  Differential diagnosis include acute coronary syndrome     versus musculoskeletal versus infection.  The patient has a     negative point of care cardiac enzyme, an EKG that showed sinus     bradycardia with no ST wave abnormalities.  We will cycle cardiac     enzymes x2 sets and repeat EKG in the morning.  For risk     stratification, she will also get a fasting lipid panel.  The     patient will get aspirin 81 mg daily.  She will also get     nitroglycerin and morphine as needed for pain. 2. Dyspnea.  The patient has 100% pulse ox on room air, but she has     been told of possible carbon monoxide exposure.  We will get a     carbon monoxide level in her blood.  If this shows possibler     outside than normal range, the patient will need oxygen     supplementation as treatment. 3. Anemia.  The patient has a hemoglobin of 10.58.  We will check a     fecal occult blood tests and iron panel, although her MCV is normal     at 84.9.  We will repeat a CBC in a.m. 4. Chronic lower extremity and upper extremity muscle weakness and     pain.  We will get a sed rate to rule out polymyalgia rheumatica.     The patient states that Dr. Audria Nine is in a process of ordering a     brain MRI to workup her weakness.  I will try to call the     [facility] to verify this.  If is possible, we could order an MRI     while the patient is here.  We will get PT/OT consult for weakness     and possible placement. 5. Hypokalemia.  We will replete with potassium chloride 40 mEq x1 and     check BMET in the morning. 6. Depression.  The patient is currently on Celexa, but she did not     know the dose of her medication.  We will wait for medical     reconciliation and would be  started as soon as we have the     medication reconciliation. 7. The patient has history of a left leg DVT in 2002.  She had a IVC     filter and is not  on Coumadin.  We are concerned that the long-term     use of IVC filter does place her at increased risk of another DVT     or pulmonary emboli.  She currently a D-dimer that is within normal     limits given [place] a reassurance that she does not have a DVT or PE at     this time.  We will determine if her IVC filter needs to be removed     or if she should be placed back on Coumadin. 8. FEN/GI:  Heart healthy diet.  Hep-Lock IV. 9. DVT prophylaxis, heparin 5000 units subcu q.8 h. 10.Code status, full code. 11.Disposition.  Rule out ACS and above discussed.     Angeline Slim, MD  ________________________________  Doralee Albino, MD  CT/MEDQ  D:  06/10/2010  T:  06/10/2010  Job:  [id number]  cc:   Maurice March, M.D.  Electronically Signed by CAT TA MD on 06/18/2010 10:42:30 AM Electronically Signed by Doralee Albino M.D. on 08/07/2010 04:40:20 PM

## 2010-09-01 ENCOUNTER — Other Ambulatory Visit: Payer: Self-pay | Admitting: Cardiovascular Disease

## 2010-09-01 DIAGNOSIS — N644 Mastodynia: Secondary | ICD-10-CM

## 2010-09-01 NOTE — Assessment & Plan Note (Signed)
[facility]                                 ON-CALL NOTE   [name], [name]                    MRN:          [id number]  DATE:12/03/2006                            DOB:          1955-04-23    PRIMARY CARE PHYSICIAN:  Dr. Cato Mulligan.   TIME OF CALL:  11:39am   The patient called having questions about her medications. She was seen  yesterday by Dr. Cato Mulligan and the medications were supposed to be called  into the pharmacy and the pharmacy does not have them. The nurse looked  in EMR and saw that her Norvasc and Effexor were to be called in to  [facility], so she took care of that this morning.     Lelon Perla, DO  Electronically Signed    Shawnie Dapper  DD: 12/03/2006  DT: 12/04/2006  Job #: [id number]   cc:   Valetta Mole. Swords, MD

## 2010-09-01 NOTE — Op Note (Signed)
NAMERAYONA, SARDINHA NO.:  [id number]   MEDICAL RECORD NO.:  [id number]          PATIENT TYPE:  AMB   LOCATION:  ENDO                         FACILITY:  [facility]   PHYSICIAN:  Anselmo Rod, M.D.  DATE OF BIRTH:  1955/12/13   DATE OF PROCEDURE:  10/19/2006  DATE OF DISCHARGE:                               OPERATIVE REPORT   PROCEDURE PERFORMED:  Screening colonoscopy up to hepatic flexure.   ENDOSCOPIST:  Anselmo Rod, M.D.   INSTRUMENT USED:  Pentax video colonoscope.   INDICATIONS FOR PROCEDURE:  A 55 year old [place] American female  undergoing screening colonoscopy. The patient had a large amount of  stool on the previous attempt and therefore has been prepped with a  bottle of magnesium citrate and a gallon of NuLYTELY the night prior to  the procedure after being maintained on clear liquids for 2 days prior  to the procedure.   PREPROCEDURE PHYSICAL EXAMINATION:  VITAL SIGNS:  The patient had stable  vital signs.  NECK:  Supple.  CHEST:  Clear to auscultation.  CARDIOVASCULAR:  S1, S2 regular.  ABDOMEN:  Soft, with normal bowel sounds.   DESCRIPTION OF THE PROCEDURE:  The patient was placed in THE left  lateral decubitus position and sedated with 70 mcg of Fentanyl and 7 mg  of Versed given intravenously in slow incremental doses.Once the patient  was adequately sedated and maintained on low-flow oxygen and continuous  cardiac monitoring, the Pentax video colonoscope was advanced from the  rectum to the hepatic flexure with difficulty. A few scattered  diverticula were noted. There was a large amount of residual stool in  the colon. Visualization was inadequate.  The procedure was aborted,  with plans to reprep the patient today and attempt the procedure again  tomorrow.  The patient will be given a gallon of NuLYTELY from the  office for her convenience.  Dr. Elnoria Howard will attempt a repeat colonoscopy  tomorrow.      Anselmo Rod, M.D.  Electronically Signed     JNM/MEDQ  D:  10/19/2006  T:  10/20/2006  Job:  [id number]   cc:   Valetta Mole. Swords, MD  [address] [place]  [place] [address]

## 2010-09-01 NOTE — Op Note (Signed)
[name], [name] NO.:  [id number]   MEDICAL RECORD NO.:  [id number]          PATIENT TYPE:  AMB   LOCATION:  ENDO                         FACILITY:  [facility]   PHYSICIAN:  Anselmo Rod, M.D.  DATE OF BIRTH:  1955/07/09   DATE OF PROCEDURE:  09/19/2006  DATE OF DISCHARGE:  09/19/2006                               OPERATIVE REPORT   PROCEDURE PERFORMED:  Colonoscopy changed to a flexible sigmoidoscopy.   ENDOSCOPIST:  Anselmo Rod, M.D.   INSTRUMENT USED:  Pentax video colonoscope.   INDICATIONS FOR PROCEDURE:  A 55 year old African-American female who  underwent a screening colonoscopy.  The patient has a history of  worsening constipation with severe abdominal pain. Rule out colonic  polyps, masses, etc.   PREPROCEDURE PREPARATION:  Informed consent was procured from the  patient.  The patient fasted for 8 hours prior to the procedure and  prepped with a bottle of magnesium citrate and a gallon of NuLYTELY the  night prior to the procedure.  The risks and benefits of the procedure  including a 10% miss rate of cancer and polyp were discussed with the  patient as well.   PREPROCEDURE PHYSICAL:  The patient had stable vital signs.  Neck  supple.  Chest clear to auscultation.  S1 and S2 regular.  Abdomen soft  with normal bowel sounds.   DESCRIPTION OF PROCEDURE:  The patient was placed in the left lateral  decubitus position, sedated with Fentanyl and Versed.  Once the patient  was adequately sedated and maintained on low flow oxygen and continuous  cardiac monitoring the Pentax video colonoscope was advanced from the  rectum to 20 cm with difficulty.  There was a large amount of stool in  the colon and prep was poor.  The procedure was aborted at this point  with plans to reprep the patient and redo the procedure at a later date.   IMPRESSION:  Incomplete prep, a large amount of residual stool in the  sigmoid colon.  The procedure was aborted at  this point.   RECOMMENDATIONS:  The patient will be prepped again with a two day prep  where she will be maintained on clear liquids for two days and given  NuLYTELY prep x2 before repeat colonoscopy is attempted.  The patient  will be seen in the office on 09/23/2006 for further recommendations  prior to being set up for another colonoscopy.     Anselmo Rod, M.D.  Electronically Signed    JNM/MEDQ  D:  09/21/2006  T:  09/21/2006  Job:  [id number]   cc:   Valetta Mole. Swords, MD

## 2010-09-01 NOTE — Op Note (Signed)
NAMESEAN, MACWILLIAMS NO.:  [id number]   MEDICAL RECORD NO.:  [id number]          PATIENT TYPE:  AMB   LOCATION:  ENDO                         FACILITY:  [facility]   PHYSICIAN:  Anselmo Rod, M.D.  DATE OF BIRTH:  Sep 19, 1955   DATE OF PROCEDURE:  09/19/2006  DATE OF DISCHARGE:  09/19/2006                               OPERATIVE REPORT   PROCEDURE PERFORMED:  Esophagogastroduodenoscopy with cold biopsies.   ENDOSCOPIST:  Anselmo Rod, M.D.   INSTRUMENT USED:  Pentax video panendoscope.   INDICATION FOR PROCEDURE:  A 55 year old African American female  undergoing EGD for abdominal pain. She has a history of reflux, rule out  peptic ulcer disease, esophagitis, gastritis, etc. The patient has a  history of gastric bypass in the past and also has a history of  hypertension and sleep apnea.   PREPROCEDURE PREPARATION:  Informed consent was procured from the  patient.  The patient was fasted for 8 hours prior to the procedure.  Risks and benefits of the procedure were discussed with the patient in  great detail.   PREPROCEDURE PHYSICAL:  The patient had stable vital signs.  Neck  supple. Chest clear to auscultation.  S1 and S2 regular. Abdomen soft  with normal bowel sounds.  Mild diffuse tenderness on palpation with  guarding.  No rebound or rigidity.   DESCRIPTION OF PROCEDURE:  The patient was placed in the left lateral  decubitus position and sedated with Fentanyl and Versed. Once the  patient was adequately sedated, maintained on low-flow oxygen and  continuous cardiac monitoring the Pentax video panendoscope was advanced  through the mouthpiece over the tongue into the esophagus under direct  vision.  The entire esophagus was widely patent with no evidence of  ring, stricture, mass, esophagitis or Barrett's mucosa.  The scope was  then advanced in the stomach.  A nodular ridge was noted throughout the  length of the stomach along the greater curvature  which I suspect may be  from previous bypass surgery.  Sutures were noted in the duodenal bulb.  The exact nature of the surgery she has had in the past is however not  clear to me at this time but the gastric pouch  seems be of normal size.  There are no other postsurgical changes noted in the stomach.  A small  nodule was biopsied from the mid body of the stomach along the lesser  curvature.  The proximal small bowel appeared normal except for the  sutures mentioned in the duodenal bulb.  The GE junction appeared  healthy.  The patient tolerated the procedure well without  complications.   IMPRESSION:  1. Normal-appearing esophagus with no evidence of ring, stricture,      mass, esophagitis or Barrett's mucosa.  2. Small nodule biopsied from mid body of the stomach along with      lesser curvature.  3. Prominent ridge along the greater curvature, question postsurgical      changes.  4. Normal proximal small bowel.  No masses, polyps, erosions,      ulcerations noted.   RECOMMENDATIONS:  1. Await pathology  results.  2. Proceed with a colonoscopy at this time.  3. Further recommendations will be made thereafter.  Avoidance of all      nonsteroidal's have been encouraged for now.      Anselmo Rod, M.D.  Electronically Signed     JNM/MEDQ  D:  09/21/2006  T:  09/21/2006  Job:  [id number]   cc:   Valetta Mole. Swords, MD

## 2010-09-04 NOTE — Op Note (Signed)
   TNAMEGELENE, RECKTENWALD                    ACCOUNT NO.:  [id number]   MEDICAL RECORD NO.:  [id number]                   PATIENT TYPE:  EMS   LOCATION:  MINO                                 FACILITY:  [facility]   PHYSICIAN:  Vikki Ports, M.D.         DATE OF BIRTH:  [birth year]   DATE OF PROCEDURE:  09/27/2001  DATE OF DISCHARGE:  10/24/2001                                 OPERATIVE REPORT   REDICTATION   PREOPERATIVE DIAGNOSIS:  Symptomatic cholelithiasis.   POSTOPERATIVE DIAGNOSIS:  Symptomatic cholelithiasis.   PROCEDURE:  Laparoscopic cholecystectomy.   SURGEON:  Vikki Ports, M.D.   ASSISTANT:  Anselm Pancoast. Zachery Dakins, M.D.   ANESTHESIA:  General.   DESCRIPTION OF PROCEDURE:  The patient was taken to the operating room,  placed in the supine position and after adequate general anesthesia was  induced, the abdomen was prepped and draped in the normal sterile fashion.  Using a transverse infraumbilical incision, I dissected down to the fascia.  The fascia was opened vertically. An #0 Vicryl pursestring suture was placed  around the fascial defect. Hasson trocar was placed in the abdomen and the  abdomen was insufflated with continuous flow carbon dioxide to a pressure of  15 mmHg. Under direct visualization, a 10 mm port was placed in the  subxiphoid region and two 5 mm ports were placed in the right abdomen. The  gallbladder was identified and retracted cephalad. Dissection at the  infundibulum revealed the cystic duct, a good window was created behind the  cystic duct visualizing the liver and cystic artery. The cystic duct was  then triply clipped and divided. The cystic artery was then identified in a  similar fashion. A good window was created posterior to it and it was triply  clipped and divided. The gallbladder was taken off the gallbladder bed using  Bovie electrocautery, removed through the umbilical port. This was sent for  pathologic evaluation.  The right upper quadrant was copiously irrigated,  adequate hemostasis was ensured and pneumoperitoneum was released. The  infraumbilical fascial defect was closed with the #0 Vicryl pursestring  sutures, skin incisions were closed with subcuticular 4-0 Monocryl, Steri-  Strips and sterile dressings were applied. The patient tolerated the  procedure well and went to PACU in good condition.                                               Vikki Ports, M.D.    [facility]/MEDQ  D:  12/12/2001  T:  12/13/2001  Job:  [id number]

## 2010-09-04 NOTE — Consult Note (Signed)
[facility]  Patient:    [name], [name]                  MRN: [id number] Adm. Date:  [id number] Disc. Date: [id number] Attending:  Judie Petit CC:         Valetta Mole. Swords, M.D. [facility]  Esmeralda Arthur, M.D.   Consultation Report  HISTORY OF PRESENT ILLNESS:  [name] is a 55 year old African-American female who came to the emergency room with shortness of breath in the context of an ultrasound documented ______ ______ earlier this week.  Medical consultation was requested for possible admission and definitive therapy.  She has been having leg pain for approximately two months intermittently, but it became more severe since February 28, 2000.  It became almost constant, and she consulted Dr. Cato Mulligan this week, and a venous ultrasound was performed. She states there were "three clots in the lower leg and one in the groin."  On March 03, 2000, she was placed on Lovenox twice a day and Coumadin.  She was also given Vicodin for pain.  Today, she began to experience dyspnea on exertion and this prompted emergency room visit.  She has been on Provera and Vivelle which were discontinued on March 04, 2000, by Dr. Katrinka Blazing.  She has continued to smoke 1/2 pack per day despite the documented deep venous thrombosis.  PAST MEDICAL HISTORY: 1. Arthroscopy as an outpatient. 2. Tonsillectomy and adenoidectomy.  MEDICATIONS: 1. Glucophage 1000 mg b.i.d. 2. Glucotrol 10 mg. 3. Avandia 8 mg daily. 4. Prinivil 12.5 mg daily. 5. She takes albuterol as needed for "seasonal asthma."  She continues to have pain in the left lower extremity laterally which is sharp.  She denies any cough or sputum production.  There has been no paroxysmal nocturnal dyspnea, but simply dyspnea on exertion.  She is diabetic, on oral agents as noted above.  FAMILY HISTORY:  Negative for clotting disorders or bleeding dyscrasias.  Her maternal  grandmother had myocardial infarction and diabetes.  Maternal aunts had cancer.  Mother has diabetes.  There is no family history of stroke or tuberculosis.  PHYSICAL EXAMINATION:  GENERAL:  She is in no acute distress.  She is morbidly obese.  VITAL SIGNS:  Blood pressure 211/151, pulse 75, respiratory rate 28, temperature 97.4.  She has no palpable edema.  NECK:  No carotid bruits.  HEENT:  Otolarynglottic exam was unremarkable.  CHEST:  Clear with no rubs, and there is no splinting.  She has a grade 1 systolic murmur.  ABDOMEN:  Massively protuberant with a panniculus.  There is dullness to percussion of the right upper quadrant.  EXTREMITIES:  Posterior tibial pulses are decreased.  There is no definite Homan, although she localizes pain to the lateral calf.  Although she is given a no increased shortness of breathing, even with movements on exam table, she did have a scattered mild rhonchi, particularly on the right.  LABORATORY DATA:  Chest x-ray showed low lung volumes with left lower lobe atelectasis.  CT revealed no pulmonary fibroembolism.  No deep venous thrombosis was seen on limited CT of the legs.  No active disease felt to be present.  The findings were discussed with her.  CPK was normal at 54.  Comprehensive metabolic panel was normal except for an albumin of 3.4.  Troponin was less than 0.01.  White count was 9800, hematocrit 41.6.  EKG revealed normal sinus rhythm.  Arterial blood gas on room air  revealed a PO2 of 74, PCO2 of 33.  RECOMMENDATIONS:  It was recommended that she continue the Lovenox and Coumadin as directed.  She will take the Vicodin as needed for pain.  Hydrochlorothiazide 25 mg 1/2 daily was added to facilitate blood pressure control.  Additionally, Norvasc 5 mg daily was added.  FOLLOWUP:  She is to see Dr. Cato Mulligan on March 07, 2000, and review these findings and recommendations with him. DD:  03/05/00 TD:  03/06/00 Job:  [id number]

## 2010-09-16 ENCOUNTER — Inpatient Hospital Stay (HOSPITAL_COMMUNITY)
Admission: AD | Admit: 2010-09-16 | Discharge: 2010-09-22 | DRG: 287 | Disposition: A | Discharge status: Home / Self Care | Payer: Medicaid Other | Source: Ambulatory Visit | Attending: Cardiovascular Disease | Admitting: Cardiovascular Disease

## 2010-09-16 DIAGNOSIS — M1990 Unspecified osteoarthritis, unspecified site: Secondary | ICD-10-CM | POA: Diagnosis present

## 2010-09-16 DIAGNOSIS — Z79899 Other long term (current) drug therapy: Secondary | ICD-10-CM

## 2010-09-16 DIAGNOSIS — I10 Essential (primary) hypertension: Secondary | ICD-10-CM | POA: Diagnosis present

## 2010-09-16 DIAGNOSIS — Z9884 Bariatric surgery status: Secondary | ICD-10-CM

## 2010-09-16 DIAGNOSIS — R5381 Other malaise: Secondary | ICD-10-CM | POA: Diagnosis present

## 2010-09-16 DIAGNOSIS — F329 Major depressive disorder, single episode, unspecified: Secondary | ICD-10-CM | POA: Diagnosis present

## 2010-09-16 DIAGNOSIS — D649 Anemia, unspecified: Secondary | ICD-10-CM | POA: Diagnosis present

## 2010-09-16 DIAGNOSIS — Z86718 Personal history of other venous thrombosis and embolism: Secondary | ICD-10-CM

## 2010-09-16 DIAGNOSIS — R5383 Other fatigue: Secondary | ICD-10-CM | POA: Diagnosis present

## 2010-09-16 DIAGNOSIS — I5021 Acute systolic (congestive) heart failure: Principal | ICD-10-CM | POA: Diagnosis present

## 2010-09-16 DIAGNOSIS — E876 Hypokalemia: Secondary | ICD-10-CM | POA: Diagnosis present

## 2010-09-16 DIAGNOSIS — I509 Heart failure, unspecified: Secondary | ICD-10-CM | POA: Diagnosis present

## 2010-09-16 DIAGNOSIS — F3289 Other specified depressive episodes: Secondary | ICD-10-CM | POA: Diagnosis present

## 2010-09-16 DIAGNOSIS — I9589 Other hypotension: Secondary | ICD-10-CM | POA: Diagnosis not present

## 2010-09-16 DIAGNOSIS — I2510 Atherosclerotic heart disease of native coronary artery without angina pectoris: Secondary | ICD-10-CM | POA: Diagnosis present

## 2010-09-16 LAB — CARDIAC PANEL(CRET KIN+CKTOT+MB+TROPI)
CK, MB: 3.8 ng/mL (ref 0.3–4.0)
Relative Index: 1.4 (ref 0.0–2.5)
Troponin I: <0.3 ng/mL (ref <0.30)

## 2010-09-16 LAB — DIFFERENTIAL
Eosinophils Absolute: 0.1 10*3/uL (ref 0.0–0.7)
Lymphocytes Relative: 41 % (ref 12–46)
Lymphs Abs: 3 10*3/uL (ref 0.7–4.0)
Monocytes Relative: 6 % (ref 3–12)
Neutro Abs: 3.8 10*3/uL (ref 1.7–7.7)
Neutrophils Relative %: 51 % (ref 43–77)

## 2010-09-16 LAB — CBC
HCT: 30.5 % — ABNORMAL LOW (ref 36.0–46.0)
Hemoglobin: 10.3 g/dL — ABNORMAL LOW (ref 12.0–15.0)
MCH: 27.3 pg (ref 26.0–34.0)
MCV: 80.9 fL (ref 78.0–100.0)
Platelets: 246 10*3/uL (ref 150–400)
RBC: 3.77 MIL/uL — ABNORMAL LOW (ref 3.87–5.11)
WBC: 7.5 10*3/uL (ref 4.0–10.5)

## 2010-09-16 LAB — PRO B NATRIURETIC PEPTIDE: Pro B Natriuretic peptide (BNP): 310.7 pg/mL — ABNORMAL HIGH (ref 0–125)

## 2010-09-16 LAB — APTT: aPTT: 31 seconds (ref 24–37)

## 2010-09-17 LAB — CBC
HCT: 29.2 % — ABNORMAL LOW (ref 36.0–46.0)
MCHC: 33.2 g/dL (ref 30.0–36.0)
MCV: 81.3 fL (ref 78.0–100.0)
Platelets: 241 10*3/uL (ref 150–400)
RDW: 18.8 % — ABNORMAL HIGH (ref 11.5–15.5)

## 2010-09-17 LAB — BASIC METABOLIC PANEL
BUN: 25 mg/dL — ABNORMAL HIGH (ref 6–23)
BUN: 25 mg/dL — ABNORMAL HIGH (ref 6–23)
CO2: 22 mEq/L (ref 19–32)
Calcium: 6.6 mg/dL — ABNORMAL LOW (ref 8.4–10.5)
Calcium: 6.7 mg/dL — ABNORMAL LOW (ref 8.4–10.5)
Calcium: 6.6 mg/dL — ABNORMAL LOW (ref 8.4–10.5)
Chloride: 112 mEq/L (ref 96–112)
Creatinine, Ser: 0.54 mg/dL (ref 0.4–1.2)
Creatinine, Ser: <0.47 mg/dL (ref 0.4–1.2)
GFR calc Af Amer: >60 mL/min (ref >60)
GFR calc non Af Amer: >60 mL/min (ref >60)
GFR calc non Af Amer: >60 mL/min (ref >60)
GFR calc non Af Amer: >60 mL/min (ref >60)
Glucose, Bld: 122 mg/dL — ABNORMAL HIGH (ref 70–99)
Glucose, Bld: 87 mg/dL (ref 70–99)
Potassium: 2.6 mEq/L — CL (ref 3.5–5.1)
Sodium: 142 mEq/L (ref 135–145)

## 2010-09-17 LAB — CARDIAC PANEL(CRET KIN+CKTOT+MB+TROPI)
Relative Index: 1.3 (ref 0.0–2.5)
Troponin I: <0.3 ng/mL (ref <0.30)
Troponin I: <0.3 ng/mL (ref <0.30)

## 2010-09-17 LAB — LIPID PANEL
Cholesterol: 126 mg/dL (ref 0–200)
HDL: 62 mg/dL (ref >39)
LDL Cholesterol: 53 mg/dL (ref 0–99)
Total CHOL/HDL Ratio: 2 RATIO
VLDL: 11 mg/dL (ref 0–40)

## 2010-09-18 LAB — BASIC METABOLIC PANEL
Calcium: 6.6 mg/dL — ABNORMAL LOW (ref 8.4–10.5)
Creatinine, Ser: 0.52 mg/dL (ref 0.4–1.2)
GFR calc Af Amer: >60 mL/min (ref >60)
GFR calc non Af Amer: >60 mL/min (ref >60)

## 2010-09-18 LAB — MAGNESIUM: Magnesium: 2.1 mg/dL (ref 1.5–2.5)

## 2010-09-18 LAB — PRO B NATRIURETIC PEPTIDE: Pro B Natriuretic peptide (BNP): 336.8 pg/mL — ABNORMAL HIGH (ref 0–125)

## 2010-09-20 LAB — CBC
Platelets: 222 10*3/uL (ref 150–400)
RDW: 19.3 % — ABNORMAL HIGH (ref 11.5–15.5)
WBC: 5.4 10*3/uL (ref 4.0–10.5)

## 2010-09-20 LAB — BASIC METABOLIC PANEL
GFR calc non Af Amer: >60 mL/min (ref >60)
Potassium: 3.6 mEq/L (ref 3.5–5.1)
Sodium: 140 mEq/L (ref 135–145)

## 2010-09-20 LAB — PRO B NATRIURETIC PEPTIDE: Pro B Natriuretic peptide (BNP): 86 pg/mL (ref 0–125)

## 2010-09-21 LAB — VITAMIN B12: Vitamin B-12: 1101 pg/mL — ABNORMAL HIGH (ref 211–911)

## 2010-09-21 LAB — CBC
Hemoglobin: 9.4 g/dL — ABNORMAL LOW (ref 12.0–15.0)
MCHC: 32.4 g/dL (ref 30.0–36.0)
Platelets: 227 10*3/uL (ref 150–400)
RBC: 3.44 MIL/uL — ABNORMAL LOW (ref 3.87–5.11)

## 2010-09-21 LAB — IRON AND TIBC
Iron: 38 ug/dL — ABNORMAL LOW (ref 42–135)
TIBC: 288 ug/dL (ref 250–470)

## 2010-09-23 NOTE — Discharge Summary (Signed)
[name], [name] NO.:  [id number]  MEDICAL RECORD NO.:  [id number]           PATIENT TYPE:  I  LOCATION:  4743                         FACILITY:  [facility]  PHYSICIAN:  Ricki Rodriguez, M.D.  DATE OF BIRTH:  February 12, 1956  DATE OF ADMISSION:  09/16/2010 DATE OF DISCHARGE:  09/18/2010                              DISCHARGE SUMMARY   FINAL DIAGNOSES: 1. Acute early left heart systolic failure. 2. Hypertension. 3. Obesity. 4. Generalized weakness. 5. Depression.  DISCHARGE MEDICATIONS: 1. Aspirin 81 mg daily. 2. Metoprolol 25 mg 1/2 a tablet twice daily. 3. Nitroglycerin 0.4 mg tablet 1 sublingual every 5 minutes x3 as     needed for chest pain. 4. Potassium chloride 20 mEq 1 daily. 5. Crestor 2.5 mg at bedtime. 6. Lisinopril/hydrochlorothiazide 10/12.5 mg 1/2 a tablet daily. 7. Celexa 20 mg daily. 8. Gabapentin 300 mg 3 times daily. 9. Motrin 800 mg 3 times daily as needed. 10.Multivitamin 1 daily. 11.Percocet 5/325 mg 1 every 6 hours as needed.  DISCHARGE DIET:  Low-sodium, heart-healthy diet.  DISCHARGE ACTIVITY:  The patient is to increase activity gradually as tolerated and use walker as needed.  The patient is to also use of home PT/OT if qualified.  CONDITION ON DISCHARGE:  Stable.  FOLLOWUP:  By Dr. Orpah Cobb in 2 weeks.  HISTORY:  This 55 year old black female presented with 1-week history of progressive leg edema, shortness of breath, and chest pain.  PHYSICAL EXAMINATION:  GENERAL:  The patient is averagely built and well- nourished black female in mild distress. VITAL SIGNS:  Pulse 60, respirations 20, blood pressure 117/75, and temperature 98.3. HEENT:  The patient is normocephalic and atraumatic with conjunctivae pale, pink.  Sclerae white.  Eyes brown.  Pupils equally reactive to light.  Extraocular movement intact. NECK:  Positive JVD, negative bruit. LUNGS:  Few basilar crackles. HEART:  Normal S1 and S2 with grade 2/6  systolic murmur. ABDOMEN:  Soft. EXTREMITIES:  2+ edema. CENTRAL NERVOUS SYSTEM:  The patient moves all 4 extremities.  Alert and oriented x3.  LABORATORY DATA:  Normal WBC count and platelet count, hemoglobin 10.3, hematocrit 30.5.  PT/INR normal.  Pro-BNP slightly elevated at 310. Electrolytes showed a potassium of 2.6, BUN 25, creatinine 0.54.  Lipid profile was essentially unremarkable.  Subsequent BMET after potassium supplement showed normal electrolytes, BUN, and creatinine.  Cardiac panel negative, CK-MB, troponin I x3.  Magnesium level was normal at 2.1.    EKG showed normal sinus rhythm.   Cardiac catheterization showed near-normal coronaries with normal LV  systolic function.  HOSPITAL COURSE:  The patient was admitted to Telemetry Unit. Myocardial infarction was ruled out.  She got IV Lasix as diuresis and she required large doses of potassium supplementation.  She underwent cardiac catheterization that failed to show any critical coronary artery stenosis.  Her medications were adjusted and she was discharged home with a possible home PT/OT and follow up by me in 2 weeks.     Ricki Rodriguez, M.D.     ASK/MEDQ  D:  09/18/2010  T:  09/19/2010  Job:  [id number]  Electronically Signed by Orpah Cobb  M.D. on 09/23/2010 08:32:32 AM

## 2010-10-05 ENCOUNTER — Ambulatory Visit: Payer: Medicaid Other | Admitting: Physical Therapy

## 2010-10-19 NOTE — Discharge Summary (Signed)
  NAMEWANNETTA, LANGLAND NO.:  [id number]  MEDICAL RECORD NO.:  [id number]  LOCATION:  4743                         FACILITY:  [facility]  PHYSICIAN:  Ricki Rodriguez, M.D.  DATE OF BIRTH:  06/17/1955  DATE OF ADMISSION:  09/16/2010 DATE OF DISCHARGE:  09/22/2010                              DISCHARGE SUMMARY   ADDENDUM  The patient's stay was extended to September 22, 2010.  DISCHARGE MEDICATIONS:  Delete lisinopril/hydrochlorothiazide and Motrin. Add Ferrous sulfate 325 mg 1 daily.  The patient was discharged home in satisfactory condition and she will be followed by me in 2 weeks.     Ricki Rodriguez, M.D.     ASK/MEDQ  D:  10/16/2010  T:  10/16/2010  Job:  [id number]  Electronically Signed by Orpah Cobb M.D. on 10/19/2010 07:47:55 PM

## 2011-01-11 ENCOUNTER — Other Ambulatory Visit: Payer: Self-pay | Admitting: Cardiovascular Disease

## 2011-01-11 ENCOUNTER — Ambulatory Visit
Admission: RE | Admit: 2011-01-11 | Discharge: 2011-01-11 | Disposition: A | Discharge status: Home / Self Care | Payer: Medicaid Other | Source: Ambulatory Visit | Attending: Cardiovascular Disease | Admitting: Cardiovascular Disease

## 2011-01-11 DIAGNOSIS — J40 Bronchitis, not specified as acute or chronic: Secondary | ICD-10-CM

## 2011-01-11 DIAGNOSIS — R609 Edema, unspecified: Secondary | ICD-10-CM

## 2011-01-11 DIAGNOSIS — M79672 Pain in left foot: Secondary | ICD-10-CM

## 2011-01-15 LAB — POCT I-STAT, CHEM 8
BUN: 13
Calcium, Ion: 1.14
Creatinine, Ser: 0.5
Glucose, Bld: 85
Hemoglobin: 13.6
Sodium: 145
TCO2: 23

## 2011-01-15 LAB — CBC
HCT: 38.2
MCHC: 32.3
MCV: 83.4
RBC: 4.58

## 2011-01-15 LAB — DIFFERENTIAL
Basophils Relative: 1
Eosinophils Absolute: 0.1
Eosinophils Relative: 2
Monocytes Absolute: 0.3
Monocytes Relative: 5
Neutrophils Relative %: 50

## 2011-01-20 ENCOUNTER — Other Ambulatory Visit: Payer: Self-pay | Admitting: Gastroenterology

## 2011-01-20 ENCOUNTER — Ambulatory Visit (HOSPITAL_COMMUNITY)
Admission: RE | Admit: 2011-01-20 | Discharge: 2011-01-20 | Disposition: A | Discharge status: Home / Self Care | Payer: Medicaid Other | Source: Ambulatory Visit | Attending: Cardiovascular Disease | Admitting: Cardiovascular Disease

## 2011-01-20 ENCOUNTER — Ambulatory Visit (HOSPITAL_COMMUNITY)
Admission: RE | Admit: 2011-01-20 | Discharge: 2011-01-20 | Disposition: A | Discharge status: Home / Self Care | Payer: Medicaid Other | Source: Ambulatory Visit | Attending: Gastroenterology | Admitting: Gastroenterology

## 2011-01-20 DIAGNOSIS — Z9884 Bariatric surgery status: Secondary | ICD-10-CM | POA: Insufficient documentation

## 2011-01-20 DIAGNOSIS — M79609 Pain in unspecified limb: Secondary | ICD-10-CM | POA: Insufficient documentation

## 2011-01-20 DIAGNOSIS — M7989 Other specified soft tissue disorders: Secondary | ICD-10-CM

## 2011-01-20 DIAGNOSIS — K5730 Diverticulosis of large intestine without perforation or abscess without bleeding: Secondary | ICD-10-CM | POA: Insufficient documentation

## 2011-01-20 DIAGNOSIS — R1310 Dysphagia, unspecified: Secondary | ICD-10-CM | POA: Insufficient documentation

## 2011-01-20 DIAGNOSIS — R197 Diarrhea, unspecified: Secondary | ICD-10-CM | POA: Insufficient documentation

## 2011-01-21 LAB — POCT I-STAT, CHEM 8
Calcium, Ion: 1.18 mmol/L (ref 1.12–1.32)
Creatinine, Ser: 0.6 mg/dL (ref 0.50–1.10)
Glucose, Bld: 76 mg/dL (ref 70–99)
HCT: 34 % — ABNORMAL LOW (ref 36.0–46.0)
Hemoglobin: 11.6 g/dL — ABNORMAL LOW (ref 12.0–15.0)
TCO2: 18 mmol/L (ref 0–100)

## 2011-02-18 NOTE — Op Note (Signed)
  [name], SCHOENBECK NO.:  [id number]  MEDICAL RECORD NO.:  [id number]  LOCATION:  WLEN                         FACILITY:  [facility]  PHYSICIAN:  Willis Modena, MD     DATE OF BIRTH:  1955-05-13  DATE OF PROCEDURE:  01/20/2011 DATE OF DISCHARGE:                              OPERATIVE REPORT   PROCEDURE:  Colonoscopy with biopsy.  ASA III, Mallampati 2.  INDICATION:  Diarrhea.  PREOPERATIVE MEDICATION:  Propofol per Anesthesia.  FINDINGS:  After explaining the risks and benefits of the procedure, as well as diagnostic alternatives, and  after concluding a physician led pause, our procedure commenced with a digital rectal exam.  Anal sphincter tone was slightly diminished.  No palpable masses were noted. Subsequently, a pediatric colonoscope was inserted into the rectum and passed to level of the cecum.  The quality of bowel prep was fair at best.  There was lots of solid and semi-solid stool seen.  There was also a lot of fat droplets raising the possibility of steatorrhea. The ileocecal valve, cecal strap, and appendiceal orifice were seen, but not well-visualized due to prep quality.  The colonic mucosa was subsequently evaluated as best possible with slow withdrawal and circumferential inspection.  No polyps, masses, vascular ectasias, or inflammatory changes were seen, though subtle lesions or diminutive polyps could easily have been  missed.  Random biopsies of normal- appearing colon were taken with cold forceps.  There were scattered left colonic diverticula.  Retroflexed view into the rectum was normal.  IMPRESSION: 1. Suboptimal prep with fat droplets in stool, raising question of     steatorrhea. 2. Sigmoid diverticulosis. 3. Otherwise normal colonoscopy, with limits due to fair bowel     preparation.  Random biopsies to assess for microscopic colitis     were obtained.  PLAN: 1. Watch potential complications of procedure including bleeding  and     perforation. 2. Await biopsy results. 3. Consider fecal elastase quantification. 4. Will follow up with me in the office in a few weeks.     Willis Modena, MD     WO/MEDQ  D:  01/20/2011  T:  01/21/2011  Job:  [id number]  Electronically Signed by Willis Modena  on 02/18/2011 03:24:20 PM

## 2011-02-19 ENCOUNTER — Observation Stay (HOSPITAL_COMMUNITY)
Admission: EM | Admit: 2011-02-19 | Discharge: 2011-02-22 | Disposition: A | Discharge status: Skilled Nursing Facility | Payer: Medicaid Other | Attending: Internal Medicine | Admitting: Internal Medicine

## 2011-02-19 ENCOUNTER — Emergency Department (HOSPITAL_COMMUNITY): Payer: Medicaid Other

## 2011-02-19 DIAGNOSIS — J45909 Unspecified asthma, uncomplicated: Secondary | ICD-10-CM | POA: Insufficient documentation

## 2011-02-19 DIAGNOSIS — G4733 Obstructive sleep apnea (adult) (pediatric): Secondary | ICD-10-CM | POA: Diagnosis present

## 2011-02-19 DIAGNOSIS — R609 Edema, unspecified: Secondary | ICD-10-CM | POA: Insufficient documentation

## 2011-02-19 DIAGNOSIS — D649 Anemia, unspecified: Secondary | ICD-10-CM | POA: Insufficient documentation

## 2011-02-19 DIAGNOSIS — R748 Abnormal levels of other serum enzymes: Secondary | ICD-10-CM | POA: Insufficient documentation

## 2011-02-19 DIAGNOSIS — F3289 Other specified depressive episodes: Secondary | ICD-10-CM | POA: Insufficient documentation

## 2011-02-19 DIAGNOSIS — M1990 Unspecified osteoarthritis, unspecified site: Secondary | ICD-10-CM | POA: Insufficient documentation

## 2011-02-19 DIAGNOSIS — R7401 Elevation of levels of liver transaminase levels: Secondary | ICD-10-CM | POA: Diagnosis present

## 2011-02-19 DIAGNOSIS — F411 Generalized anxiety disorder: Secondary | ICD-10-CM | POA: Insufficient documentation

## 2011-02-19 DIAGNOSIS — M549 Dorsalgia, unspecified: Secondary | ICD-10-CM | POA: Insufficient documentation

## 2011-02-19 DIAGNOSIS — R0789 Other chest pain: Principal | ICD-10-CM | POA: Insufficient documentation

## 2011-02-19 DIAGNOSIS — Z8639 Personal history of other endocrine, nutritional and metabolic disease: Secondary | ICD-10-CM | POA: Diagnosis present

## 2011-02-19 DIAGNOSIS — M797 Fibromyalgia: Secondary | ICD-10-CM | POA: Diagnosis present

## 2011-02-19 DIAGNOSIS — I10 Essential (primary) hypertension: Secondary | ICD-10-CM | POA: Diagnosis present

## 2011-02-19 DIAGNOSIS — E119 Type 2 diabetes mellitus without complications: Secondary | ICD-10-CM

## 2011-02-19 DIAGNOSIS — F329 Major depressive disorder, single episode, unspecified: Secondary | ICD-10-CM | POA: Insufficient documentation

## 2011-02-19 DIAGNOSIS — R52 Pain, unspecified: Secondary | ICD-10-CM | POA: Diagnosis present

## 2011-02-19 DIAGNOSIS — R0602 Shortness of breath: Secondary | ICD-10-CM | POA: Insufficient documentation

## 2011-02-19 LAB — CBC
MCH: 27.2 pg (ref 26.0–34.0)
MCV: 85.4 fL (ref 78.0–100.0)
Platelets: 229 10*3/uL (ref 150–400)
RDW: 18.1 % — ABNORMAL HIGH (ref 11.5–15.5)

## 2011-02-19 LAB — COMPREHENSIVE METABOLIC PANEL
AST: 14 U/L (ref 0–37)
Albumin: 3.3 g/dL — ABNORMAL LOW (ref 3.5–5.2)
Alkaline Phosphatase: 379 U/L — ABNORMAL HIGH (ref 39–117)
BUN: 22 mg/dL (ref 6–23)
CO2: 20 mEq/L (ref 19–32)
Chloride: 114 mEq/L — ABNORMAL HIGH (ref 96–112)
Creatinine, Ser: 0.6 mg/dL (ref 0.50–1.10)
GFR calc non Af Amer: >90 mL/min (ref >90)
Potassium: 4.2 mEq/L (ref 3.5–5.1)
Total Bilirubin: 0.2 mg/dL — ABNORMAL LOW (ref 0.3–1.2)

## 2011-02-19 LAB — DIFFERENTIAL
Eosinophils Absolute: 0.1 10*3/uL (ref 0.0–0.7)
Eosinophils Relative: 2 % (ref 0–5)
Lymphs Abs: 2.3 10*3/uL (ref 0.7–4.0)
Monocytes Absolute: 0.4 10*3/uL (ref 0.1–1.0)
Monocytes Relative: 7 % (ref 3–12)

## 2011-02-19 LAB — CK TOTAL AND CKMB (NOT AT ARMC)
CK, MB: 3.1 ng/mL (ref 0.3–4.0)
Total CK: 59 U/L (ref 7–177)

## 2011-02-19 LAB — TROPONIN I: Troponin I: <0.3 ng/mL (ref <0.30)

## 2011-02-19 MED ORDER — IOHEXOL 300 MG/ML  SOLN: 100.0000 mL | Freq: Once | INTRAMUSCULAR | Status: AC | PRN

## 2011-02-20 DIAGNOSIS — M7989 Other specified soft tissue disorders: Secondary | ICD-10-CM

## 2011-02-20 LAB — CARDIAC PANEL(CRET KIN+CKTOT+MB+TROPI)
CK, MB: 2.6 ng/mL (ref 0.3–4.0)
Relative Index: INVALID (ref 0.0–2.5)
Troponin I: <0.3 ng/mL (ref <0.30)
Troponin I: <0.3 ng/mL (ref <0.30)

## 2011-02-20 LAB — COMPREHENSIVE METABOLIC PANEL
ALT: 49 U/L — ABNORMAL HIGH (ref 0–35)
AST: 131 U/L — ABNORMAL HIGH (ref 0–37)
Albumin: 2.9 g/dL — ABNORMAL LOW (ref 3.5–5.2)
Alkaline Phosphatase: 340 U/L — ABNORMAL HIGH (ref 39–117)
Calcium: 8 mg/dL — ABNORMAL LOW (ref 8.4–10.5)
GFR calc Af Amer: >90 mL/min (ref >90)
Potassium: 3.6 mEq/L (ref 3.5–5.1)
Sodium: 141 mEq/L (ref 135–145)
Total Protein: 5.7 g/dL — ABNORMAL LOW (ref 6.0–8.3)

## 2011-02-20 LAB — CBC
HCT: 29.1 % — ABNORMAL LOW (ref 36.0–46.0)
Hemoglobin: 9.4 g/dL — ABNORMAL LOW (ref 12.0–15.0)
MCV: 84.8 fL (ref 78.0–100.0)
RDW: 18.1 % — ABNORMAL HIGH (ref 11.5–15.5)
WBC: 6.6 10*3/uL (ref 4.0–10.5)

## 2011-02-20 LAB — GAMMA GT: GGT: 23 U/L (ref 7–51)

## 2011-02-20 MED ORDER — SENNA 8.6 MG PO TABS
2.0000 | ORAL_TABLET | Freq: Every day | ORAL | Status: DC | PRN
  Filled 2011-02-20: qty 2

## 2011-02-20 MED ORDER — ACETAMINOPHEN 325 MG PO TABS
650.0000 mg | ORAL_TABLET | ORAL | Status: DC | PRN
  Administered 2011-02-22: 650 mg via ORAL
  Filled 2011-02-20: qty 2

## 2011-02-20 MED ORDER — ONDANSETRON HCL 4 MG PO TABS: 4.0000 mg | ORAL_TABLET | Freq: Four times a day (QID) | ORAL | Status: DC | PRN

## 2011-02-20 MED ORDER — DICYCLOMINE HCL 10 MG PO CAPS
10.0000 mg | ORAL_CAPSULE | Freq: Four times a day (QID) | ORAL | Status: DC
  Administered 2011-02-21 – 2011-02-22 (×5): 10 mg via ORAL
  Filled 2011-02-20 (×11): qty 1

## 2011-02-20 MED ORDER — LISINOPRIL 10 MG PO TABS
10.0000 mg | ORAL_TABLET | Freq: Every day | ORAL | Status: DC
  Administered 2011-02-21: 10 mg via ORAL
  Filled 2011-02-20 (×3): qty 1

## 2011-02-20 MED ORDER — HYDROCHLOROTHIAZIDE 12.5 MG PO CAPS
12.5000 mg | ORAL_CAPSULE | Freq: Every day | ORAL | Status: DC
  Filled 2011-02-20 (×2): qty 1

## 2011-02-20 MED ORDER — NITROGLYCERIN 0.4 MG SL SUBL: 0.4000 mg | SUBLINGUAL_TABLET | SUBLINGUAL | Status: DC | PRN

## 2011-02-20 MED ORDER — PANTOPRAZOLE SODIUM 40 MG PO TBEC
40.0000 mg | DELAYED_RELEASE_TABLET | Freq: Every day | ORAL | Status: DC
  Administered 2011-02-21 – 2011-02-22 (×2): 40 mg via ORAL

## 2011-02-20 MED ORDER — CYCLOBENZAPRINE HCL 10 MG PO TABS
10.0000 mg | ORAL_TABLET | Freq: Every evening | ORAL | Status: DC | PRN
  Administered 2011-02-21 (×2): 10 mg via ORAL

## 2011-02-20 MED ORDER — METOPROLOL TARTRATE 12.5 MG HALF TABLET
12.5000 mg | ORAL_TABLET | Freq: Two times a day (BID) | ORAL | Status: DC
  Administered 2011-02-21 (×2): 12.5 mg via ORAL
  Filled 2011-02-20 (×6): qty 0.5

## 2011-02-20 MED ORDER — VITAMIN D (ERGOCALCIFEROL) 1.25 MG (50000 UNIT) PO CAPS: 50000.0000 [IU] | ORAL_CAPSULE | ORAL | Status: DC

## 2011-02-20 MED ORDER — CYANOCOBALAMIN 1000 MCG/ML IJ SOLN: 1000.0000 ug | INTRAMUSCULAR | Status: DC

## 2011-02-20 MED ORDER — VITAMIN D3 25 MCG (1000 UNIT) PO TABS
1000.0000 [IU] | ORAL_TABLET | Freq: Two times a day (BID) | ORAL | Status: DC
  Administered 2011-02-21 (×2): 1000 [IU] via ORAL
  Filled 2011-02-20 (×7): qty 1

## 2011-02-20 MED ORDER — ASPIRIN EC 81 MG PO TBEC
81.0000 mg | DELAYED_RELEASE_TABLET | Freq: Every day | ORAL | Status: DC
  Administered 2011-02-21 – 2011-02-22 (×2): 81 mg via ORAL
  Filled 2011-02-20 (×3): qty 1

## 2011-02-20 MED ORDER — CITALOPRAM HYDROBROMIDE 20 MG PO TABS
20.0000 mg | ORAL_TABLET | Freq: Every day | ORAL | Status: DC
  Administered 2011-02-21 – 2011-02-22 (×2): 20 mg via ORAL
  Filled 2011-02-20 (×3): qty 1

## 2011-02-20 MED ORDER — POTASSIUM CHLORIDE CRYS ER 20 MEQ PO TBCR
20.0000 meq | EXTENDED_RELEASE_TABLET | Freq: Two times a day (BID) | ORAL | Status: DC
  Administered 2011-02-21 – 2011-02-22 (×3): 20 meq via ORAL
  Filled 2011-02-20 (×2): qty 1

## 2011-02-20 MED ORDER — FERROUS SULFATE 325 (65 FE) MG PO TABS
325.0000 mg | ORAL_TABLET | Freq: Every day | ORAL | Status: DC
  Administered 2011-02-21 – 2011-02-22 (×2): 325 mg via ORAL
  Filled 2011-02-20 (×3): qty 1

## 2011-02-20 MED ORDER — HEPARIN SODIUM (PORCINE) 5000 UNIT/ML IJ SOLN
5000.0000 [IU] | Freq: Three times a day (TID) | INTRAMUSCULAR | Status: DC
  Administered 2011-02-21 – 2011-02-22 (×4): 5000 [IU] via SUBCUTANEOUS
  Filled 2011-02-20 (×10): qty 1

## 2011-02-20 MED ORDER — TRAMADOL HCL 50 MG PO TABS
50.0000 mg | ORAL_TABLET | Freq: Four times a day (QID) | ORAL | Status: DC | PRN
  Administered 2011-02-21 – 2011-02-22 (×3): 50 mg via ORAL
  Filled 2011-02-20 (×5): qty 1

## 2011-02-20 MED ORDER — FUROSEMIDE 40 MG PO TABS
40.0000 mg | ORAL_TABLET | Freq: Two times a day (BID) | ORAL | Status: DC
  Administered 2011-02-21 – 2011-02-22 (×3): 40 mg via ORAL
  Filled 2011-02-20 (×6): qty 1

## 2011-02-20 MED ORDER — DOCUSATE SODIUM 100 MG PO CAPS
100.0000 mg | ORAL_CAPSULE | Freq: Two times a day (BID) | ORAL | Status: DC
  Administered 2011-02-21 – 2011-02-22 (×3): 100 mg via ORAL
  Filled 2011-02-20 (×6): qty 1

## 2011-02-20 MED ORDER — SODIUM CHLORIDE 0.9 % IJ SOLN
3.0000 mL | Freq: Two times a day (BID) | INTRAMUSCULAR | Status: DC
  Administered 2011-02-21 – 2011-02-22 (×3): 3 mL via INTRAVENOUS

## 2011-02-20 MED ORDER — GABAPENTIN 600 MG PO TABS
300.0000 mg | ORAL_TABLET | Freq: Four times a day (QID) | ORAL | Status: DC
  Administered 2011-02-21 – 2011-02-22 (×5): 300 mg via ORAL
  Filled 2011-02-20 (×12): qty 0.5

## 2011-02-20 MED ORDER — VITAMIN C 500 MG PO TABS
500.0000 mg | ORAL_TABLET | Freq: Every day | ORAL | Status: DC
  Administered 2011-02-21 – 2011-02-22 (×2): 500 mg via ORAL
  Filled 2011-02-20 (×3): qty 1

## 2011-02-20 MED ORDER — THERA M PLUS PO TABS
1.0000 | ORAL_TABLET | Freq: Every day | ORAL | Status: DC
  Administered 2011-02-21 – 2011-02-22 (×2): 1 via ORAL
  Filled 2011-02-20 (×3): qty 1

## 2011-02-20 MED ORDER — ONDANSETRON HCL 4 MG/2ML IJ SOLN: 4.0000 mg | Freq: Four times a day (QID) | INTRAMUSCULAR | Status: DC | PRN

## 2011-02-21 ENCOUNTER — Other Ambulatory Visit (HOSPITAL_COMMUNITY): Payer: Medicaid Other

## 2011-02-21 ENCOUNTER — Observation Stay (HOSPITAL_COMMUNITY): Payer: Medicaid Other

## 2011-02-21 DIAGNOSIS — R7401 Elevation of levels of liver transaminase levels: Secondary | ICD-10-CM | POA: Diagnosis present

## 2011-02-21 DIAGNOSIS — M797 Fibromyalgia: Secondary | ICD-10-CM | POA: Diagnosis present

## 2011-02-21 DIAGNOSIS — R52 Pain, unspecified: Secondary | ICD-10-CM | POA: Diagnosis present

## 2011-02-21 LAB — PHOSPHORUS: Phosphorus: 2.7 mg/dL (ref 2.3–4.6)

## 2011-02-21 LAB — COMPREHENSIVE METABOLIC PANEL
ALT: 33 U/L (ref 0–35)
AST: 30 U/L (ref 0–37)
Albumin: 2.8 g/dL — ABNORMAL LOW (ref 3.5–5.2)
CO2: 23 mEq/L (ref 19–32)
Calcium: 8.1 mg/dL — ABNORMAL LOW (ref 8.4–10.5)
Creatinine, Ser: 0.62 mg/dL (ref 0.50–1.10)
GFR calc non Af Amer: >90 mL/min (ref >90)
Sodium: 143 mEq/L (ref 135–145)
Total Protein: 5.7 g/dL — ABNORMAL LOW (ref 6.0–8.3)

## 2011-02-21 LAB — CBC
MCH: 27.5 pg (ref 26.0–34.0)
Platelets: 231 10*3/uL (ref 150–400)
RBC: 3.82 MIL/uL — ABNORMAL LOW (ref 3.87–5.11)
RDW: 17.9 % — ABNORMAL HIGH (ref 11.5–15.5)
WBC: 5.6 10*3/uL (ref 4.0–10.5)

## 2011-02-21 MED ORDER — AMITRIPTYLINE HCL 25 MG PO TABS: ORAL_TABLET | ORAL | Status: DC

## 2011-02-21 NOTE — Plan of Care (Signed)
Problem: Phase II Progression Outcomes Goal: Walk in hall or up in chair TID Outcome: Not Progressing Pt is severely limited by weakness and pain.

## 2011-02-21 NOTE — Progress Notes (Signed)
Physical Therapy Evaluation Patient Details Name: [name] MRN: [id number] DOB: 02-21-1956 Today's Date: 02/21/2011  Problem List:  Patient Active Problem List  Diagnoses  . NEOP, BNG, SKIN, TRUNK  . DM  . Morbid obesity  . ANXIETY  . DEPRESSION  . OBSTRUCTIVE SLEEP APNEA  . HYPERTENSION  . URI  . ASTHMA  . OSTEOARTHRITIS  . Pain in Soft Tissues of Limb  . SYMPTOM, GENERAL NEC  . DYSPNEA/SHORTNESS OF BREATH  . DVT, HX OF  . Generalized pain  . Fibromyalgia  . Transaminitis    Past Medical History: No past medical history on file. Past Surgical History: No past surgical history on file.  PT Assessment/Plan/Recommendation PT Assessment Clinical Impression Statement: Pt presents with PROFOUND weakness and deconditioning exacerbated by generalized pain. She is highly motivated to progress with therapy and will benefit from prolonged postacute care to address deficits noted below. PT Recommendation/Assessment: Patient will need skilled PT in the acute care venue PT Problem List: Decreased strength;Decreased activity tolerance;Decreased mobility;Decreased knowledge of use of DME;Pain Barriers to Discharge: Decreased caregiver support Barriers to Discharge Comments: Pt strives to live independently but has inconsistent assistance.  Further, as a Medicaid patient she will only qualify for ONE therapy eval and THREE therapy visits as HH patient (includes PT, OT and SLP) and cannot progress with such limited resources.  [facility] prior to considering a home discharge. PT Therapy Diagnosis : Generalized weakness PT Plan PT Frequency: Min 3X/week PT Treatment/Interventions: DME instruction;Gait training;Functional mobility training;Therapeutic exercise;Patient/family education PT Recommendation Follow Up Recommendations: Skilled nursing facility Equipment Recommended: Defer to next venue PT Goals  Acute Rehab PT Goals PT Goal Formulation: With  patient Time For Goal Achievement: 2 weeks Pt will go Supine/Side to Sit: with min assist;with HOB not 0 degrees (comment degree);with cues (comment type and amount) (HOB <30 as able, cues for technique and safety) PT Goal: Supine/Side to Sit - Progress: Not met Pt will go Sit to Supine/Side: with min assist;with HOB not 0 degrees (comment degree);with cues (comment type and amount) (HOB <30 degrees and cues for safest techniques) PT Goal: Sit to Supine/Side - Progress: Not met Pt will Transfer Sit to Stand/Stand to Sit: with min assist;with upper extremity assist;from elevated surface PT Transfer Goal: Sit to Stand/Stand to Sit - Progress: Progressing toward goal Pt will Transfer Bed to Chair/Chair to Bed: with min assist;Other (comment) (with least restrictive assistive device) PT Transfer Goal: Bed to Chair/Chair to Bed - Progress: Progressing toward goal Pt will Ambulate: 16 - 50 feet;with rolling walker;with gait velocity >(comment) ft/second;with mod assist;Other (comment) (increase speed to at least 1.8 ft/sec) PT Goal: Ambulate - Progress: Progressing toward goal  PT Evaluation Precautions/Restrictions  Precautions Precautions: Fall Precaution Comments: pt unable to stand or walk unassisted  Required Braces or Orthoses: No Restrictions Weight Bearing Restrictions: No Prior Functioning  Home Living Lives With: Alone Receives Help From: Family Type of Home: House Home Layout: One level Home Adaptive Equipment: Walker - rolling Prior Function Level of Independence: Requires assistive device for independence;Needs assistance with ADLs;Needs assistance with homemaking;Needs assistance with gait;Needs assistance with tranfers (periods of total care given medical conditions and pain) Driving: No Vocation: On disability Cognition Cognition Arousal/Alertness: Awake/alert Overall Cognitive Status: Appears within functional limits for tasks assessed Orientation Level: Oriented  X4 Sensation/Coordination Sensation Light Touch: Not tested Stereognosis: Not tested Hot/Cold: Not tested Proprioception: Not tested Coordination Gross Motor Movements are Fluid and Coordinated: Not tested Fine Motor  Movements are Fluid and Coordinated: Not tested Extremity Assessment RLE Assessment RLE Assessment: Exceptions to [facility] RLE AROM (degrees) Overall AROM Right Lower Extremity: Deficits;Due to pain RLE Strength RLE Overall Strength: Deficits;Due to pain;Due to premorbid status RLE Tone RLE Tone: Mild;Other (comment) (disuse/deconditioning) LLE Assessment LLE Assessment: Exceptions to WFL LLE AROM (degrees) Overall AROM Left Lower Extremity: Deficits;Due to pain;Due to decreased strength LLE Strength LLE Overall Strength: Deficits;Due to pain;Due to premorbid status LLE Tone LLE Tone: Mild Hypotonic Details: disuse and deconditioning Mobility (including Balance) Bed Mobility Bed Mobility: No (pt out of bed and preferred return to chair) Transfers Transfers: Yes Sit to Stand: 2: Max assist;With upper extremity assist;From chair/3-in-1;With armrests Stand to Sit: 3: Mod assist;With upper extremity assist;To chair/3-in-1 Stand to Sit Details: requires cues for technique and body positioning and to slow descent secondary to poor eccentric AND concentric strength Ambulation/Gait Ambulation/Gait: Yes Ambulation/Gait Assistance: 3: Mod assist Ambulation/Gait Assistance Details (indicate cue type and reason): cues and approximation at bilateral hips secondary to pain with swing phase and difficulty shifting weight especially to unweight Left LE Assistive device: Rolling walker Gait Pattern: Decreased step length - left;Decreased step length - right;Decreased weight shift to right;Decreased dorsiflexion - left;Decreased dorsiflexion - right;Antalgic;Trunk flexed Gait velocity: unmeasured but clearly abnormally slowed secondary to deficits noted above Stairs: No Wheelchair  Mobility Wheelchair Mobility: No (may need to consider W/C mobility training in future)  Posture/Postural Control Posture/Postural Control: Postural limitations Postural Limitations: obesity with poor muscle tone and mild disuse atrophy.  flexed trunk and elevated shoulders with gait. Balance Balance Assessed: No Exercise  General Exercises - Lower Extremity Ankle Circles/Pumps: AROM;10 reps;Supine;Seated;Strengthening;Both Quad Sets: AROM;Strengthening;10 reps;Supine;Seated;Both Gluteal Sets: AROM;Strengthening;10 reps;Supine;Seated;Both Long Arc Quad: AROM;Both;10 reps;Seated End of Session PT - End of Session Equipment Utilized During Treatment: Gait belt Activity Tolerance: Patient limited by fatigue;Patient limited by pain Patient left: in chair;with call bell in reach Nurse Communication: Mobility status for transfers;Mobility status for ambulation;Other (comment) (D/C recommendations communicated) General Behavior During Session: [facility] for tasks performed Cognition: [facility] for tasks performed  Dennis Bast 02/21/2011, 3:16 PM

## 2011-02-21 NOTE — Discharge Summary (Addendum)
DISCHARGE SUMMARY  [name]  MR#: [id number]  DOB:31-Jul-1955  Date of Admission: 02/19/2011 Date of Discharge: 02/21/2011  Attending Physician:Cadden Elizondo K  Patient's NWG:NFAOZHY,QMVH S, MD, MD  Consults:  none  Discharge Diagnoses: Present on Admission:  .Generalized pain .Fibromyalgia .Transaminitis .DM .OBSTRUCTIVE SLEEP APNEA .Morbid obesity .HYPERTENSION    Current Discharge Medication List    START taking these medications   Details  amitriptyline (ELAVIL) 25 MG tablet One tab po QHS  Qty: 30 tablet, Refills: 0      CONTINUE these medications which have NOT CHANGED   Details  Ascorbic Acid (VITAMIN C PO) Take 1 tablet by mouth daily.      aspirin EC 81 MG tablet Take 81 mg by mouth daily.      Cholecalciferol (VITAMIN D3) 2000 UNITS TABS Take 1 tablet by mouth daily.      citalopram (CELEXA) 20 MG tablet Take 20 mg by mouth daily.      cyanocobalamin (,VITAMIN B-12,) 1000 MCG/ML injection Inject 1,000 mcg into the muscle every 30 (thirty) days.      cyclobenzaprine (FLEXERIL) 10 MG tablet Take 10 mg by mouth at bedtime as needed. For spasms     dicyclomine (BENTYL) 10 MG capsule Take 10 mg by mouth 4 (four) times daily.      ergocalciferol (VITAMIN D2) 50000 UNITS capsule Take 50,000 Units by mouth once a week.      ferrous sulfate 325 (65 FE) MG tablet Take 325 mg by mouth daily.      furosemide (LASIX) 40 MG tablet Take 40 mg by mouth 2 (two) times daily.      gabapentin (NEURONTIN) 300 MG capsule Take 300 mg by mouth 4 (four) times daily.      ibuprofen (ADVIL,MOTRIN) 800 MG tablet Take 800 mg by mouth every 8 (eight) hours as needed. For pain/inflammation.     lisinopril-hydrochlorothiazide (PRINZIDE,ZESTORETIC) 10-12.5 MG per tablet Take 1 tablet by mouth daily.      metoprolol tartrate (LOPRESSOR) 25 MG tablet Take 12.5 mg by mouth 2 (two) times daily.      Multiple Vitamins-Minerals (MULTIVITAMINS THER. W/MINERALS) TABS Take  1 tablet by mouth daily.      nitroGLYCERIN (NITROSTAT) 0.4 MG SL tablet Place 0.4 mg under the tongue every 5 (five) minutes as needed. For chest pain x 3 doses.     omeprazole (PRILOSEC) 20 MG capsule Take 20 mg by mouth daily.      potassium chloride SA (K-DUR,KLOR-CON) 20 MEQ tablet Take 20 mEq by mouth 2 (two) times daily.            Hospital Course: Present on Admission:  .Generalized pain: See below    .Fibromyalgia: Patient was initially evaluated for generalized pain. She did complain of pain in her chest however she was found have trigger point tenderness. Her cardiac markers were normal. She telemetry. she previous history of fibromyalgia and depression which does seem to be most consistent with. i have added to her medication regimen on discharge elavil 25 mg by mouth each bedtime.   .Transaminitis: Patient has been previously followed by Dr. Dulce Sellar of [facility]. The patient's liver function test during her hospitalization initially rose on hospital day 2 and decreased on hospital day 3. They're currently normal. It was felt that her alkaline phosphatase levels were likely secondary to decreased immobilization given her normal GGTP   .DM: Stable during this hospitalization  .OBSTRUCTIVE SLEEP APNEA: Stable during his hospitalization  .Morbid obesity: Stable.  Marland KitchenHYPERTENSION:  Stable during his hospitalization.   Day of Discharge BP 108/74  Pulse 56  Temp(Src) 97.7 F (36.5 C) (Oral)  Resp 19  Ht 5\' 7"  (1.702 m)  Wt 95.2 kg (209 lb 14.1 oz)  BMI 32.87 kg/m2  SpO2 96%  Physical Exam: Cardiovascular: Regular rate and rhythm S1-S2, she does have some left-sided chest pain with moderate to deep palpation. Lungs: Clear to auscultation bilaterally Abdomen: Soft, morbidly obese, nontender, normoactive bowel sounds Extremities: No clubbing cyanosis, she has trace pitting edema.  Results for orders placed during the hospital encounter of 02/19/11 (from the past 24  hour(s))  CARDIAC PANEL(CRET KIN+CKTOT+MB+TROPI)     Status: Normal   Collection Time   02/20/11  4:58 PM      Component Value Range   Total CK 46  7 - 177 (U/L)   CK, MB 2.5  0.3 - 4.0 (ng/mL)   Troponin I <0.30  <0.30 (ng/mL)   Relative Index RELATIVE INDEX IS INVALID  0.0 - 2.5   CBC     Status: Abnormal   Collection Time   02/21/11  5:45 AM      Component Value Range   WBC 5.6  4.0 - 10.5 (K/uL)   RBC 3.82 (*) 3.87 - 5.11 (MIL/uL)   Hemoglobin 10.5 (*) 12.0 - 15.0 (g/dL)   HCT 30.8 (*) 65.7 - 46.0 (%)   MCV 84.0  78.0 - 100.0 (fL)   MCH 27.5  26.0 - 34.0 (pg)   MCHC 32.7  30.0 - 36.0 (g/dL)   RDW 84.6 (*) 96.2 - 15.5 (%)   Platelets 231  150 - 400 (K/uL)  COMPREHENSIVE METABOLIC PANEL     Status: Abnormal   Collection Time   02/21/11  5:45 AM      Component Value Range   Sodium 143  135 - 145 (mEq/L)   Potassium 3.6  3.5 - 5.1 (mEq/L)   Chloride 113 (*) 96 - 112 (mEq/L)   CO2 23  19 - 32 (mEq/L)   Glucose, Bld 84  70 - 99 (mg/dL)   BUN 20  6 - 23 (mg/dL)   Creatinine, Ser 9.52  0.50 - 1.10 (mg/dL)   Calcium 8.1 (*) 8.4 - 10.5 (mg/dL)   Total Protein 5.7 (*) 6.0 - 8.3 (g/dL)   Albumin 2.8 (*) 3.5 - 5.2 (g/dL)   AST 30  0 - 37 (U/L)   ALT 33  0 - 35 (U/L)   Alkaline Phosphatase 361 (*) 39 - 117 (U/L)   Total Bilirubin 0.2 (*) 0.3 - 1.2 (mg/dL)   GFR calc non Af Amer >90  >90 (mL/min)   GFR calc Af Amer >90  >90 (mL/min)  MAGNESIUM     Status: Normal   Collection Time   02/21/11  5:45 AM      Component Value Range   Magnesium 1.7  1.5 - 2.5 (mg/dL)  PHOSPHORUS     Status: Normal   Collection Time   02/21/11  5:45 AM      Component Value Range   Phosphorus 2.7  2.3 - 4.6 (mg/dL)    Disposition: Improved   Follow-up Appts: Discharge Orders    Future Orders Please Complete By Expires   Ambulatory referral to Home Health      Comments:   Please evaluate [name] for admission to [facility].  Disciplines requested: Physical Therapy  Services to  provide: Evaluate  Physician to follow patient's care (the person listed here will be responsible  for signing ongoing orders): Referring Provider  Requested Start of Care Date: Tomorrow  Special Instructions:   Diet - low sodium heart healthy, carb modified      Increase activity slowly         Follow-up with Dr.Kadakia, PCP  in 1 week.   Tests Needing Follow-up: None  Signed: Lurlene Ronda K 02/21/2011, 1:29 PM   Addendum is as follows: Patient was evaluated by physical therapy who found her to have enough weakness the he felt that her best option would be short term skilled nursing.  After discussion with the patient she is amenable to going. Social work is working on finding a facility and hopefully the patient will be able to be discharged today or tomorrow. Is otherwise medically stable.

## 2011-02-22 NOTE — Progress Notes (Signed)
Patient discharged to SNF, upon discharge patient was stable. Patient left hospital through ambulance in a stretcher to be taken to SNF. A copy of the patient's chart along with discharge instructions went with the patient.

## 2011-02-22 NOTE — Progress Notes (Signed)
CSW has completed assessment and placed assessment in shadow chart. FL2 is also placed in shadow chart.

## 2011-02-22 NOTE — Progress Notes (Signed)
CSW faxed dc summary and medications to SNF. CSW received permission for patient to admit. CSW informed pt, pt's sister and RN of dc and all in and him and were agreeable. CSW prepared dc packet. CSW coordinated transportation via [place]. CSW is signing off.

## 2011-02-22 NOTE — Progress Notes (Signed)
Initial UR complete.  

## 2011-02-22 NOTE — Progress Notes (Signed)
Cosign for Gailen Shelter RN for medications, assessment, and IV.

## 2011-03-24 ENCOUNTER — Ambulatory Visit: Payer: Medicaid Other | Attending: Cardiovascular Disease | Admitting: Physical Therapy

## 2011-03-24 DIAGNOSIS — M6281 Muscle weakness (generalized): Secondary | ICD-10-CM | POA: Insufficient documentation

## 2011-03-24 DIAGNOSIS — IMO0001 Reserved for inherently not codable concepts without codable children: Secondary | ICD-10-CM | POA: Insufficient documentation

## 2011-03-24 DIAGNOSIS — R269 Unspecified abnormalities of gait and mobility: Secondary | ICD-10-CM | POA: Insufficient documentation

## 2011-04-02 ENCOUNTER — Ambulatory Visit: Payer: Medicaid Other | Admitting: Physical Therapy

## 2011-04-09 ENCOUNTER — Ambulatory Visit: Payer: Medicaid Other | Admitting: Physical Therapy

## 2011-04-15 ENCOUNTER — Ambulatory Visit: Payer: Medicaid Other | Admitting: Physical Therapy

## 2011-04-23 ENCOUNTER — Ambulatory Visit: Payer: Medicaid Other | Attending: Cardiovascular Disease | Admitting: Physical Therapy

## 2011-04-23 DIAGNOSIS — M6281 Muscle weakness (generalized): Secondary | ICD-10-CM | POA: Insufficient documentation

## 2011-04-23 DIAGNOSIS — IMO0001 Reserved for inherently not codable concepts without codable children: Secondary | ICD-10-CM | POA: Insufficient documentation

## 2011-04-23 DIAGNOSIS — R269 Unspecified abnormalities of gait and mobility: Secondary | ICD-10-CM | POA: Insufficient documentation

## 2011-04-26 ENCOUNTER — Other Ambulatory Visit: Payer: Self-pay | Admitting: Gastroenterology

## 2011-04-26 DIAGNOSIS — R1012 Left upper quadrant pain: Secondary | ICD-10-CM

## 2011-04-28 ENCOUNTER — Ambulatory Visit: Payer: Medicaid Other | Admitting: Physical Therapy

## 2011-04-28 ENCOUNTER — Ambulatory Visit
Admission: RE | Admit: 2011-04-28 | Discharge: 2011-04-28 | Disposition: A | Discharge status: Home / Self Care | Payer: Medicaid Other | Source: Ambulatory Visit | Attending: Gastroenterology | Admitting: Gastroenterology

## 2011-04-28 DIAGNOSIS — R1012 Left upper quadrant pain: Secondary | ICD-10-CM

## 2011-04-28 MED ORDER — IOHEXOL 300 MG/ML  SOLN
125.0000 mL | Freq: Once | INTRAMUSCULAR | Status: AC | PRN
  Administered 2011-04-28: 125 mL via INTRAVENOUS

## 2011-05-05 ENCOUNTER — Encounter: Payer: Medicaid Other | Attending: Neurosurgery | Admitting: Neurosurgery

## 2011-05-05 ENCOUNTER — Ambulatory Visit: Payer: Medicaid Other | Admitting: Physical Medicine and Rehabilitation

## 2011-05-13 ENCOUNTER — Encounter: Payer: Medicaid Other | Attending: Neurosurgery | Admitting: Neurosurgery

## 2011-05-13 ENCOUNTER — Ambulatory Visit: Payer: Medicaid Other | Admitting: Physical Therapy

## 2011-05-13 DIAGNOSIS — G894 Chronic pain syndrome: Secondary | ICD-10-CM | POA: Insufficient documentation

## 2011-05-13 DIAGNOSIS — E119 Type 2 diabetes mellitus without complications: Secondary | ICD-10-CM | POA: Insufficient documentation

## 2011-05-13 DIAGNOSIS — E669 Obesity, unspecified: Secondary | ICD-10-CM | POA: Insufficient documentation

## 2011-05-13 DIAGNOSIS — I10 Essential (primary) hypertension: Secondary | ICD-10-CM | POA: Insufficient documentation

## 2011-05-13 DIAGNOSIS — Z7982 Long term (current) use of aspirin: Secondary | ICD-10-CM | POA: Insufficient documentation

## 2011-05-13 DIAGNOSIS — Z9884 Bariatric surgery status: Secondary | ICD-10-CM | POA: Insufficient documentation

## 2011-05-13 DIAGNOSIS — Z79899 Other long term (current) drug therapy: Secondary | ICD-10-CM | POA: Insufficient documentation

## 2011-05-13 DIAGNOSIS — M129 Arthropathy, unspecified: Secondary | ICD-10-CM | POA: Insufficient documentation

## 2011-05-14 ENCOUNTER — Encounter: Payer: Medicaid Other | Admitting: Neurosurgery

## 2011-05-14 NOTE — Progress Notes (Signed)
[name] is a nice lady who is referred here by Dr. Algie Coffer, who is her internal medicine/cardiologist, for complaints of fibromyalgia.  She states the symptoms have been going on for about a year.  She has all over joint pain as well as she is tender throughout the 18 fibromyalgia spots.  She is currently taking ibuprofen 800 mg daily as well as gabapentin and tramadol and was referred over for further pain management and evaluation.  Her average pain is not rated in the orders of her activity level.  She states the pain is worse in the morning. Sleep patterns are fair.  Walking, bending, standing, and activity aggravate.  Medication helps some.  She walks with assistance.  She does use a walker now for safety.  She does not climb steps, she does drive. She can walk up to 10 minutes at a time and needs some help with transfers.  Functionally, she is not employed.  She is on disability and needs help with ADLs.  REVIEW OF SYSTEMS:  Notable for difficulties described above as well as some bowel and bladder control issues, weakness, paresthesias, trouble walking, spasm, depression, anxiety.  No suicidal thoughts.  She does have some abdominal pain and limb swelling.  CURRENT PHYSICIANS:  Dr. Algie Coffer.  She has been evaluated by Dr. Maeola Harman as well as Dr. Madelon Lips, Dr. Dulce Sellar, and Dr. Lucianne Muss.  CURRENT MEDICATION LIST: 1. Vitamin B12 daily. 2. Ibuprofen 800 mg t.i.d. 3. Calcium 600 mg q.i.d. 4. Calcitriol 0.5 mg a day. 5. Vitamin B2, 1.25 mg a week. 6. Gabapentin 300 mg t.i.d. 7. Iron 325 mg a day. 8. Vitamins daily. 9. Omeprazole 20 mg a day. 10.Metoprolol 25 mg half twice a day. 11.Lisinopril/hydrochlorothiazide 10/12.5 mg a day. 12.Potassium 20 mEq twice a day. 13.Creon DR 2400 units 2 with meals and 1 after snack. 14.Citalopram 20 mg daily. 15.Lasix 40 mg daily. 16.Aspirin 81 mg a day. 17.Tramadol up to 3 times a day.  She reports only allergies to SULFA DRUGS.  PAST  MEDICAL HISTORY:  Significant for: 1. Hypertension. 2. Stomach problems. 3. Diabetes. 4. Arthritis.  She had: 1. A gallbladder surgery in 2003. 2. A gastric bypass in 2004. 3. A hernia repair in 2005.  Socially, she is widowed, lives alone.  FAMILY HISTORY:  Significant for heart disease, lung disease, diabetes, hypertension.  PHYSICAL EXAMINATION:  VITAL SIGNS:  Her blood pressure is 131/55, pulse 63, respirations 16, O2 sats 97 on room air. CONSTITUTIONAL:  She is obese.  She is alert and oriented x3.  She is very slow to rise from a seated position.  She does have a significant limp. EXTREMITIES:  Motor strength is 5/5 in the upper and lower extremities, especially in the upper extremities, she does exhibit a little weakness, I do not think it is muscular, and the deltoid, biceps, triceps, intrinsics, and grip are also somewhat weaker.  Lower extremities strength is 5/5 with iliopsoas, quadriceps, dorsiflexors, EHL, and plantar flexor.  Deep tendon reflexes are minimal in the upper extremities bilaterally.  She is 1 at the quadriceps, absent at the gastrocs.  Toes are equivocal.  Sensation is intact to pinprick throughout the upper and lower extremities.  Again, she is tender in all fibromyalgia points.  The patient states she has had a couple of falls, no injuries and she has had no injury in the past to precipitate her pain.  ASSESSMENT:  Chronic pain syndrome, probable fibromyalgia.  PLAN:  I went ahead and refilled her  tramadol 50 mg 1 p.o. t.i.d., 9 with a couple of refills.  She will follow up here in 2-3 weeks with Dr. Pamelia Hoit, review her urine drug screen which was completed today.  Her questions were otherwise encouraged and answered.     [name] L. Blima Dessert Electronically Signed    RLW/MedQ D:  05/13/2011 14:55:23  T:  05/14/2011 02:11:26  Job #:  [id number]

## 2011-06-04 ENCOUNTER — Encounter: Payer: Medicaid Other | Attending: Neurosurgery | Admitting: Physical Medicine and Rehabilitation

## 2011-06-04 DIAGNOSIS — R29898 Other symptoms and signs involving the musculoskeletal system: Secondary | ICD-10-CM | POA: Insufficient documentation

## 2011-06-04 DIAGNOSIS — M545 Low back pain, unspecified: Secondary | ICD-10-CM | POA: Insufficient documentation

## 2011-06-04 DIAGNOSIS — G8929 Other chronic pain: Secondary | ICD-10-CM | POA: Insufficient documentation

## 2011-06-04 DIAGNOSIS — M1710 Unilateral primary osteoarthritis, unspecified knee: Secondary | ICD-10-CM

## 2011-06-04 DIAGNOSIS — I10 Essential (primary) hypertension: Secondary | ICD-10-CM | POA: Insufficient documentation

## 2011-06-04 DIAGNOSIS — M542 Cervicalgia: Secondary | ICD-10-CM

## 2011-06-04 DIAGNOSIS — G894 Chronic pain syndrome: Secondary | ICD-10-CM

## 2011-06-04 DIAGNOSIS — Z86718 Personal history of other venous thrombosis and embolism: Secondary | ICD-10-CM | POA: Insufficient documentation

## 2011-06-04 DIAGNOSIS — J45909 Unspecified asthma, uncomplicated: Secondary | ICD-10-CM | POA: Insufficient documentation

## 2011-06-04 DIAGNOSIS — M79609 Pain in unspecified limb: Secondary | ICD-10-CM

## 2011-06-04 DIAGNOSIS — M546 Pain in thoracic spine: Secondary | ICD-10-CM

## 2011-06-04 DIAGNOSIS — Z9884 Bariatric surgery status: Secondary | ICD-10-CM | POA: Insufficient documentation

## 2011-06-04 DIAGNOSIS — M25569 Pain in unspecified knee: Secondary | ICD-10-CM | POA: Insufficient documentation

## 2011-06-04 DIAGNOSIS — IMO0001 Reserved for inherently not codable concepts without codable children: Secondary | ICD-10-CM | POA: Insufficient documentation

## 2011-06-04 DIAGNOSIS — R32 Unspecified urinary incontinence: Secondary | ICD-10-CM | POA: Insufficient documentation

## 2011-06-04 DIAGNOSIS — G4730 Sleep apnea, unspecified: Secondary | ICD-10-CM | POA: Insufficient documentation

## 2011-06-04 DIAGNOSIS — R279 Unspecified lack of coordination: Secondary | ICD-10-CM | POA: Insufficient documentation

## 2011-06-04 DIAGNOSIS — M5020 Other cervical disc displacement, unspecified cervical region: Secondary | ICD-10-CM | POA: Insufficient documentation

## 2011-06-05 NOTE — Assessment & Plan Note (Signed)
Ms.  [name] is a 56 year old, African American woman who was initially seen by [name] on May 13, 2011.  At the time of her visit with him, she had been taking tramadol and he refilled her medication for her, obtained urine drug screen.  [facility] to [facility] for multiple chronic pain complaints.  She is a patient of Dr. Algie [name].  [name] states her problems began sometime in 2011.  At that time, she was at school.  She tells me she hit her head, and then later that day, fell down the stairs.  She states her chief problem is balance problems which began sometime after the fall.  She has been using a walker for sometime now.  She also complains of some bladder incontinence.  She has been wearing pads for this.  She is currently seeing Urology, [name].  She is undergoing a workup and apparently may have some urodynamic testing plan.  Her third problem is leg and arm weakness which she has noted since about 2011 as well.  She also complains of some pain in her legs, her back, and her knees.  Regarding her pain, her number 1 problem is bilateral knee pain.  She also has some thigh pain and some low back pain.  She also has a problem with some chronic neck pain which began sometime in 2012.  She has been seen by neurosurgeon, [name], which was in January 2012.  I do not have any notes from his visit with her. She has had an MRI of the cervical, cervical spine which showed some small to moderate-sized central disk protrusion with mass effect on the thecal sac per the radiologic reading at that time, mild foraminal stenosis was noted on the right at C3-4 as well.  She has degenerative disks at C5-6 with flattening of the thecal sac without foraminal stenosis.  Flexion-extension views also done showed degenerative disk disease at C5-6.  She may have some mild instability at C3-4 as well.  She indicates that her  average pain is about a 7 to 8 on a scale of 10 and this is multifactorial in nature.  She has some neck pain, low back pain, thigh pain, and bilateral knee pain.  She has been told she has some arthritis in her knees but has not had any x-rays that she remembers recently.  Reviewing her electronic chart I do see x-rays of her right knee which were done in September 2010 showing osteopenic bones and degenerative joint disease of that right knee.  FUNCTIONAL STATUS:  The patient uses a walker.  She can walk 2-3 minutes.  She is able to drive.  She cannot climb stairs.  She is independent with feeding and toileting, needs some assistance especially with lower extremity dressing and bathing.  She admits to bowel, bladder problems, numbness, tingling, trouble walking, depression, denies suicidal ideation.  She also admits to weakness.  REVIEW OF SYSTEMS:  Positive for weight gain, nausea, diarrhea, abdominal pain, limb swelling, and shortness of breath.  PAST MEDICAL HISTORY:  Remarkable for history of diabetes which she states she had diabetes in the past, but after her weight loss she is no longer diabetic.  History of anxiety and depression.  History of sleep apnea.  History of hypertension, asthma, history of DVT, fibromyalgia, and chronic pain.  PAST SURGICAL HISTORY:  Includes a history of gastric bypass surgery. [name] 2004.  History of arthroscopic surgery 1998 on  her knee. She has a inferior vena cava filter placed.  Unsure of the date.  Status post cholecystectomy.  History of tonsillectomy and history of hernia surgery.  SOCIAL HISTORY:  The patient is widowed.  She denies alcohol and smoking.  Denies illegal substance use.  FAMILY HISTORY:  Positive for diabetes and hypertension.  The patient reports that she weighed 425 pounds prior to her gastric bypass surgery in 2004 and is now 130 pounds.  EXAM:  VITAL SIGNS:  Her blood pressure is 122/67, pulse 60,  respiration 18, and 100% saturated on room air. GENERAL:  She is a well-developed, obese woman who does not appear in any distress. NEUROLOGICAL:  She is oriented x3.  Speech is clear.  Affect is bright. She is alert, cooperative, and pleasant.  Follows commands without difficulty.  Answers my questions appropriately.  Cranial nerves and coordination are grossly intact. MUSCULOSKELETAL:  Her reflexes are slightly brisk in the upper extremities, and she has a week weeks positive Hoffmann on the left. Her reflexes are diminished in both lower extremities at the patellar and Achilles tendon without abnormal tone, clonus, or tremors.  Her motor strength is relatively well preserved in the upper extremities with the exception of left triceps seems to be somewhat weaker than the right, 4/5 strength.  Her lower extremity manual muscle testing reveals weakness of bilateral hip flexors, hip abductors and especially knee extensors on the left.  She has difficulty maintaining antigravity with hip flexors on the left.  Lower extremities were also remarkable for bilateral lower extremity edema.  She reports decreased sensation in the left upper extremity, and somewhat into the left shoulder as well.  IMPRESSION: 1. Balance disorder for several years now, etiology not clear may be     related to cervical stenosis.  Would also like to check some blood     work including B12 to rule out nutritional deficiency as a cause of     this as well. 2. Bladder incontinence.  The patient is currently following up with     [name] and is apparently undergoing urodynamic testing. 3. Leg and/arm weakness, etiology not clear, may be related to     cervical stenosis as well.  She has had multiple falls, may     consider repeat radiographs of her neck.  She has some mid thoracic     pain with palpation today.  We will obtain thoracic radiographs to     rule out any compression, given her history of  falls. 4. Bilateral knee pain with known osteoarthritis per radiographs done     in 2010. 5. Chronic neck pain since 2012.  PLAN:  We will start out with requesting notes from [name].  We will consider a repeat cervical radiographs, flexion extension views.  She had some mild listhesis at C3-4 noted on flexion-extension views.  I cannot rule out nutritional issues as a possibility for her as well. May consider some blood work in the upcoming month as well.  She tells me she recently finished with some physical therapy to work on balance. We will explore this further with her at the next visit to see if she is following up with home program as well.  We will continue her on tramadol at this time.  She is using 50 mg twice a day.  We will increase it to 50 mg 3 times a day.  She understands that many pain medications can contribute to worsening balance, and we would  like to minimize that for her.  She had some questions about ibuprofen.  I reviewed the risks and benefits of this medication with her.  She has been on it for a while as well.  Gabapentin is currently being used at 300 mg 3 times a day.  There may be some room to adjust this medication as well for her.  Radiographs already indicated prior to next visit. Consider blood work at the next visit as well.  She is comfortable with the current workup at this time.  Her urine drug screen which was done on May 13, 2011.  There is no evidence of aberrant behavior with the use of any of her pain medications.     Brantley Stage, M.D.    DMK/MedQ D:  06/04/2011 14:13:16  T:  06/05/2011 04:19:20  Job #:  [id number]

## 2011-06-16 ENCOUNTER — Ambulatory Visit
Admission: RE | Admit: 2011-06-16 | Discharge: 2011-06-16 | Disposition: A | Discharge status: Home / Self Care | Payer: Medicaid Other | Source: Ambulatory Visit | Attending: Physical Medicine and Rehabilitation | Admitting: Physical Medicine and Rehabilitation

## 2011-06-16 ENCOUNTER — Other Ambulatory Visit: Payer: Self-pay | Admitting: Physical Medicine and Rehabilitation

## 2011-06-16 DIAGNOSIS — M549 Dorsalgia, unspecified: Secondary | ICD-10-CM

## 2011-06-16 DIAGNOSIS — M25569 Pain in unspecified knee: Secondary | ICD-10-CM

## 2011-06-18 ENCOUNTER — Telehealth: Payer: Self-pay | Admitting: Physical Medicine and Rehabilitation

## 2011-06-18 MED ORDER — TRAMADOL HCL 50 MG PO TABS: 50.0000 mg | ORAL_TABLET | Freq: Two times a day (BID) | ORAL | Status: DC

## 2011-06-18 NOTE — Telephone Encounter (Signed)
Patient requesting r/f on Tramadol, and Motrin 800mg .  [facility] - [phone]

## 2011-06-18 NOTE — Telephone Encounter (Signed)
Pt aware that I can refill her Tramadol but the Motrin I will have to ask Dr. Pamelia Hoit about we have not rx'd this for her. She understood.

## 2011-06-21 ENCOUNTER — Telehealth: Payer: Self-pay | Admitting: Physical Medicine and Rehabilitation

## 2011-06-21 NOTE — Telephone Encounter (Signed)
Patient inquiring about her Motrin 800mg .  Will Dr fill?

## 2011-06-23 ENCOUNTER — Telehealth: Payer: Self-pay | Admitting: Physical Medicine and Rehabilitation

## 2011-06-23 MED ORDER — IBUPROFEN 800 MG PO TABS: 800.0000 mg | ORAL_TABLET | Freq: Three times a day (TID) | ORAL | Status: DC | PRN

## 2011-06-23 MED ORDER — TRAMADOL HCL 50 MG PO TABS: 50.0000 mg | ORAL_TABLET | Freq: Three times a day (TID) | ORAL | Status: DC

## 2011-06-23 NOTE — Telephone Encounter (Signed)
Pt aware rx has been sent in  

## 2011-06-23 NOTE — Telephone Encounter (Signed)
May refill Motrin per patient request

## 2011-06-23 NOTE — Telephone Encounter (Signed)
Addended by: Caryl Ada on: 06/23/2011 10:30 AM   Modules accepted: Orders

## 2011-06-23 NOTE — Telephone Encounter (Signed)
Dr Pamelia Hoit said she could increase Tramadol to TID, but was filled  for BID.  Pharmacy will not fill because too early.  Also needing Motrin 800mg  refilled.  Please call pharmacy.

## 2011-06-23 NOTE — Telephone Encounter (Signed)
Tramadol has been sent to pharmacy for TID and pt aware that Motrin was sent to pharmacy this morning.

## 2011-06-24 ENCOUNTER — Other Ambulatory Visit: Payer: Self-pay | Admitting: *Deleted

## 2011-06-24 MED ORDER — IBUPROFEN 800 MG PO TABS: 800.0000 mg | ORAL_TABLET | Freq: Three times a day (TID) | ORAL | Status: DC | PRN

## 2011-06-24 MED ORDER — TRAMADOL HCL 50 MG PO TABS: 50.0000 mg | ORAL_TABLET | Freq: Three times a day (TID) | ORAL | Status: DC

## 2011-07-07 ENCOUNTER — Ambulatory Visit: Payer: Medicaid Other | Admitting: Physical Medicine and Rehabilitation

## 2011-07-12 ENCOUNTER — Encounter: Payer: Self-pay | Admitting: Physical Medicine and Rehabilitation

## 2011-07-12 ENCOUNTER — Encounter: Payer: Medicaid Other | Attending: Neurosurgery | Admitting: Physical Medicine and Rehabilitation

## 2011-07-12 VITALS — BP 137/63 | HR 67 | Resp 14 | Ht 67.0 in | Wt 224.0 lb

## 2011-07-12 DIAGNOSIS — R269 Unspecified abnormalities of gait and mobility: Secondary | ICD-10-CM | POA: Insufficient documentation

## 2011-07-12 DIAGNOSIS — M5030 Other cervical disc degeneration, unspecified cervical region: Secondary | ICD-10-CM | POA: Insufficient documentation

## 2011-07-12 DIAGNOSIS — G8929 Other chronic pain: Secondary | ICD-10-CM | POA: Insufficient documentation

## 2011-07-12 DIAGNOSIS — M542 Cervicalgia: Secondary | ICD-10-CM

## 2011-07-12 DIAGNOSIS — G5790 Unspecified mononeuropathy of unspecified lower limb: Secondary | ICD-10-CM | POA: Insufficient documentation

## 2011-07-12 DIAGNOSIS — G5793 Unspecified mononeuropathy of bilateral lower limbs: Secondary | ICD-10-CM

## 2011-07-12 DIAGNOSIS — M25519 Pain in unspecified shoulder: Secondary | ICD-10-CM

## 2011-07-12 MED ORDER — GABAPENTIN 300 MG PO CAPS: 300.0000 mg | ORAL_CAPSULE | Freq: Three times a day (TID) | ORAL | Status: DC

## 2011-07-12 MED ORDER — IBUPROFEN 800 MG PO TABS: 800.0000 mg | ORAL_TABLET | Freq: Three times a day (TID) | ORAL | Status: DC | PRN

## 2011-07-12 MED ORDER — TRAMADOL HCL 50 MG PO TABS: 50.0000 mg | ORAL_TABLET | Freq: Three times a day (TID) | ORAL | Status: DC

## 2011-07-12 NOTE — Progress Notes (Signed)
Subjective:    Patient ID: [name], female    DOB: 1955-12-27, 56 y.o.   MRN: [id number]  HPIs. [name] is a 56 year old, African American woman who was  initially seen by Lauree Chandler NP on May 13, 2011. African American woman who was  initially seen by Lauree Chandler NP on May 13, 2011. At the time of her  visit with him, she had been taking tramadol and he refilled her  medication for her, obtained urine drug screen.  She [facility] to [facility] for  multiple chronic pain complaints. She is a patient of Dr. Algie Coffer. Ms.  Vacca states her problems began sometime in 2011. At that time, she  was at school. She tells me she hit her head, and then later that day,  fell down the stairs.  She states her chief problem is balance problems which began sometime  after the fall. She has been using a walker for sometime now. She also  complains of some bladder incontinence. She has been wearing pads for  this. She is currently seeing Urology, Dr. McDiarmid. She is  undergoing a workup and apparently may have some urodynamic testing  plan.  Her third problem is leg and arm weakness which she has noted since  about 2011 as well. She also complains of some pain in her legs, her  back, and her knees. Regarding her pain, her number 1 problem is  bilateral knee pain. She also has some thigh pain and some low back  pain. She also has a problem with some chronic neck pain which began  sometime in 2012. She has been seen by neurosurgeon, Dr. Venetia Maxon, which  was in January 2012. I do not have any notes from his visit with her.  She has had an MRI of the cervical, cervical spine which showed some  small to moderate-sized central disk protrusion with mass effect on the  thecal sac per the radiologic reading at that time, mild foraminal  stenosis was noted on the right at C3-4 as well. She has degenerative  disks at C5-6 with flattening of the thecal sac without foraminal  stenosis. Flexion-extension views also done showed  degenerative disk  disease at C5-6. She may have some mild instability at C3-4 as well.  She indicates that her average pain is about a 7 to 8 on a scale of 10  and this is multifactorial in nature. She has some neck pain, low back  pain, thigh pain, and bilateral knee pain. She has been told she has  some arthritis in her knees but has not had any x-rays that she  remembers recently. Reviewing her electronic chart I do see x-rays of  her right knee which were done in September 2010 showing osteopenic  bones and degenerative joint disease of that right knee.    Pain Inventory Average Pain 7 Pain Right Now 8 My pain is dull, stabbing and aching  In the last 24 hours, has pain interfered with the following? General activity 8 Relation with others 10 Enjoyment of life 10 What TIME of day is your pain at its worst? Morning and Night Sleep (in general) Poor  Pain is worse with: walking, bending, standing and some activites Pain improves with: medication Relief from Meds: 5  Mobility walk with assistance use a cane use a walker how many minutes can you walk? 5 ability to climb steps?  yes do you drive?  yes transfers alone  Function not employed: date last employed 06/2008 disabled: date disabled Pending Do you have any goals in  this area?  yes  Neuro/Psych bladder control problems No problems in this area weakness trouble walking depression anxiety  Prior Studies Any changes since last visit?  no  Physicians involved in your care Any changes since last visit?  no  Review of Systems  HENT: Negative.   Eyes: Negative.   Respiratory: Negative.   Cardiovascular: Negative.   Gastrointestinal: Positive for abdominal pain and diarrhea.  Musculoskeletal: Negative.   Neurological: Positive for weakness.  Hematological: Negative.        Objective:   Physical Exam She is an obese woman who appears her stated age and does not appear in any distress. She is oriented  x3 speech is clear affect is bright she's alert cooperative and pleasant follows commands with difficulty and his questions appropriately  Cranial nerves are intact. Coordination is good in the upper extremities.  Motor strength is generally good in the upper extremities however she has weak hip flexors bilaterally 4 minus over 5.  Reflexes are with 2+ in the upper extremities without abnormal tone. Hoffmann sign is negative  Reflexes are diminished in both lower extremities without abnormal tone.  Hip flexors are somewhat weak too and she has some difficulty and getting a chair without use of her upper extremities.  Gait is unsteady. She uses a walker. Tandem gait and Rhombergs tests are not assessed due to safety concerns.   She reports intact sensation in the upper and lower extremities to light touch but has some decreased vibratory sensation in the lower extremities.  Extension of lumbar spine is limited and she reports discomfort in the low back which radiates through both lower extremities posteriorly to the knees.  Cervical range of motion is mildly limited and she reports pain in all planes  Shoulder range of motion is mildly limited bilaterally  She is some tenderness over the left lateral elbow.         Assessment & Plan:  IMPRESSION:  1. Gait disorder for several years now, etiology not clear may be  related to cervical stenosis.    Gait disorders are frequently multifactorial in origin   . Non-neurologic causes include visual loss, orthopedic disorders, pain, side effects of drugs, and cardiorespiratory problems. These may be the sole problem, or co-exist with neurologic causes. I would also like to check some blood  work including B12 to rule out nutritional deficiency as a cause of  this as well. Could onsider neurology work up as well.  Interventions to help minimze falls: Minimize medications Improved gait balance mobility lower extremity strength-pt has  been in PT Treatment vision impairment Monitor for postural hypotension Assess foot and footwear problems Modify home environment  I have reviewed this with her.   2. Bladder incontinence. The patient is currently following up with  Dr. Perley Jain and is apparently undergoing urodynamic testing.   3. Leg and/arm weakness, etiology not clear, may be related to  cervical stenosis as well. She has had multiple falls, may  consider repeat radiographs of her neck. She has some mid thoracic  pain with palpation at last visit.  02/23/10 Lumbar MR  1. No change in moderate central canal stenosis at L4-5 where  there is also left foraminal narrowing.  2. No change in moderate central canal stenosis and narrowing of  both lateral recesses at L5-S1.  3. New prominent Schmorl's node in the superior plate of L3.  consider ESI if lower extremity pain problematic  Thoracic radiograph 06/16/11  Moderate to severe thoracolumbar scoliosis  is again noted  with associated degenerative change. No acute compression  deformity is seen.   4. Bilateral knee pain with known osteoarthritis per radiographs done  in 2010.    12/29/08  Right knee X ray There is tricompartment joint space narrowing, marginal spur  formation and sharpening of the tibial spines consistent with  degenerative joint disease.    06/16/11  Knee X Ray There is primarily bicompartmental degenerative joint  disease involving the medial compartment and patellofemoral  compartments. There is loss of joint space, sclerosis, and  spurring at these levels. There may be some progression particular  involving the patellofemoral compartments as the films of 2010.  Very minimal spurring is noted from the lateral compartment. No  joint effusion is seen.  I would consider Voltaren Gel but the patient is currently getting benefit from her ibuprofen she takes 3 times a day. I have reviewed the risks and benefits of this medicine with  her.   5. Chronic neck and arm pain since 2012 with history of falls.  06/16/11 Cervical Radiographs flexon/extension view: Global decreased range of motion in the lower cervical spine from  C5-C6 inferiorly. Decreased range of motion in flexion. No  abnormal motion. Chronic C5-C6 disc degeneration.    PLAN: We will start out with requesting notes from Dr. Venetia Maxon.   I cannot rule out nutritional issues as a possibility for her as well.   Obtain B12 level  We discussed considering a neurology consult to evaluate for neurologic causes of her gait disorder, she will be seeing her primary care physician next week and will bring this up then.  Continue to do a home program to maintain gains made in the therapy program   I will continue her on tramadol at this time. She is using 50 mg twice a day. We will  increase it to 50 mg 3 times a day. She understands that many pain  medications can contribute to worsening balance, and we would like to  minimize that for her.   She had some questions about ibuprofen. I reviewed the risks and benefits of this medication with her. She has been on it for a while as well.   Gabapentin is currently being used at  300 mg 3 times a day. There may be some room to adjust this medication  as well for her.   She will also try over-the-counter Capzasion  cream on her knees    She is comfortable with the current workup at this time.   Her urine drug screen which was done on May 13, 2011. There is no evidence of aberrant behavior  with the use of any of her pain medications.

## 2011-07-12 NOTE — Patient Instructions (Addendum)
I will see you back in 6 weeks 

## 2011-07-13 ENCOUNTER — Encounter: Payer: Self-pay | Admitting: Physical Medicine and Rehabilitation

## 2011-07-13 LAB — VITAMIN B12: Vitamin B-12: 1650 pg/mL — ABNORMAL HIGH (ref 211–911)

## 2011-07-29 ENCOUNTER — Other Ambulatory Visit: Payer: Self-pay | Admitting: *Deleted

## 2011-08-06 ENCOUNTER — Telehealth: Payer: Self-pay | Admitting: Physical Medicine and Rehabilitation

## 2011-08-06 MED ORDER — IBUPROFEN 800 MG PO TABS: 800.0000 mg | ORAL_TABLET | Freq: Three times a day (TID) | ORAL | Status: DC | PRN

## 2011-08-06 NOTE — Telephone Encounter (Signed)
Rx has been sent in, pt aware. 

## 2011-08-06 NOTE — Telephone Encounter (Signed)
Hydrocodone r/f to [facility] on [address]?  When she called , they did not have.

## 2011-08-06 NOTE — Telephone Encounter (Signed)
Patient needs Ibuprofen r/f.  Not Hydrocodone.

## 2011-08-06 NOTE — Telephone Encounter (Signed)
LM with pt to call back. We don't have any documentation supporting that we rx this for her.

## 2011-08-23 ENCOUNTER — Encounter: Payer: Self-pay | Admitting: Physical Medicine and Rehabilitation

## 2011-08-23 ENCOUNTER — Encounter: Payer: Medicaid Other | Attending: Neurosurgery | Admitting: Physical Medicine and Rehabilitation

## 2011-08-23 VITALS — BP 106/62 | HR 65 | Resp 16 | Ht 67.0 in | Wt 217.0 lb

## 2011-08-23 DIAGNOSIS — G5790 Unspecified mononeuropathy of unspecified lower limb: Secondary | ICD-10-CM

## 2011-08-23 DIAGNOSIS — G5793 Unspecified mononeuropathy of bilateral lower limbs: Secondary | ICD-10-CM

## 2011-08-23 DIAGNOSIS — G8929 Other chronic pain: Secondary | ICD-10-CM

## 2011-08-23 DIAGNOSIS — M25559 Pain in unspecified hip: Secondary | ICD-10-CM

## 2011-08-23 DIAGNOSIS — M5030 Other cervical disc degeneration, unspecified cervical region: Secondary | ICD-10-CM

## 2011-08-23 DIAGNOSIS — R2689 Other abnormalities of gait and mobility: Secondary | ICD-10-CM

## 2011-08-23 DIAGNOSIS — M25512 Pain in left shoulder: Secondary | ICD-10-CM

## 2011-08-23 DIAGNOSIS — M25551 Pain in right hip: Secondary | ICD-10-CM

## 2011-08-23 DIAGNOSIS — M545 Low back pain, unspecified: Secondary | ICD-10-CM

## 2011-08-23 DIAGNOSIS — R269 Unspecified abnormalities of gait and mobility: Secondary | ICD-10-CM

## 2011-08-23 DIAGNOSIS — M542 Cervicalgia: Secondary | ICD-10-CM

## 2011-08-23 DIAGNOSIS — M25519 Pain in unspecified shoulder: Secondary | ICD-10-CM

## 2011-08-23 MED ORDER — TRAMADOL HCL 50 MG PO TABS: 50.0000 mg | ORAL_TABLET | Freq: Four times a day (QID) | ORAL | Status: DC | PRN

## 2011-08-23 MED ORDER — GABAPENTIN 300 MG PO CAPS: 300.0000 mg | ORAL_CAPSULE | Freq: Four times a day (QID) | ORAL | Status: DC

## 2011-08-23 NOTE — Progress Notes (Signed)
Subjective:    Patient ID: [name], female    DOB: 1955/07/28, 56 y.o.   MRN: [id number]  HPI  [name] is a 56 year old, African American woman who was  initially seen by Lauree Chandler NP on May 13, 2011. At the time of her  visit with him, she had been taking tramadol and he refilled her  medication for her, obtained urine drug screen.  She was referred to the [facility] for  multiple chronic pain complaints. She is a patient of Dr. Algie Coffer. Ms.  Walsh states her problems began sometime in 2011. At that time, she  was at school. She tells me she hit her head, and then later that day,  fell down the stairs.  She states her chief problem is balance problems which began sometime  after the fall. She has been using a walker for sometime now.     She also  complains of some bladder incontinence. She has been wearing pads for  this. She is currently seeing Urology, Dr. McDiarmid. She is  undergoing a workup and apparently may have some urodynamic testing  plan.    Her third problem is leg and arm weakness which she has noted since  about 2011 as well. She also complains of some pain in her legs, her  back, and her knees.She also has some thigh pain and some low back  pain.     Regarding her pain, her number 1 problem is  bilateral knee pain.     She also has a problem with some chronic neck pain which began  sometime in 2012. She has been seen by neurosurgeon, Dr. Venetia Maxon, which  was in January 2012. I do not have any notes from his visit with her.   She has had an MRI of the cervical, cervical spine which showed some  small to moderate-sized central disk protrusion with mass effect on the  thecal sac per the radiologic reading at that time, mild foraminal  stenosis was noted on the right at C3-4 as well. She has degenerative  disks at C5-6 with flattening of the thecal sac without foraminal  stenosis. Flexion-extension  views also done showed degenerative disk  disease at C5-6. She may have some mild instability at C3-4 as well.  She indicates that her average pain is about a 7 to 8 on a scale of 10  and this is multifactorial in nature.     She has some neck pain, low back  pain, thigh pain, and bilateral knee pain.   She has been told she has  some arthritis in her knees but has not had any x-rays that she  remembers recently.    Reviewing her electronic chart I do see x-rays of  her right knee which were done in September 2010 showing osteopenic  bones and degenerative joint disease of that right knee.         Pain Inventory Average Pain 7 Pain Right Now 9 My pain is aching  In the last 24 hours, has pain interfered with the following? General activity 9 Relation with others 10 Enjoyment of life 10 What TIME of day is your pain at its worst? Morning and Night Sleep (in general) Poor  Pain is worse with: walking, bending and standing Pain improves with: rest, heat/ice and medication Relief from Meds: 4  Mobility walk with assistance use a cane use a walker how many minutes can you walk? 2 minutes ability to climb  steps?  no do you drive?  yes transfers alone Do you have any goals in this area?  yes  Function disabled: date disabled 03/2008 I need assistance with the following:  bathing, household duties and shopping  Neuro/Psych bladder control problems bowel control problems weakness numbness tingling trouble walking spasms depression anxiety loss of taste or smell  Prior Studies Any changes since last visit?  no  Physicians involved in your care Any changes since last visit?  no  Review of Systems  Constitutional: Positive for appetite change and unexpected weight change.  HENT: Negative.   Eyes: Negative.   Respiratory: Negative.   Gastrointestinal: Positive for abdominal pain and diarrhea.  Genitourinary: Negative.   Musculoskeletal: Positive for  gait problem.  Skin: Negative.   Neurological: Positive for weakness and numbness.  Hematological: Negative.   Psychiatric/Behavioral: Negative.        Objective:   Physical Exam  She is an obese woman who appears her stated age and does not appear in any distress. She is oriented x3 speech is clear affect is bright she's alert cooperative and pleasant follows commands with difficulty and his questions appropriately  Cranial nerves are intact. Coordination is good in the upper extremities.  Motor strength is generally good in the upper extremities however she has weak hip flexors bilaterally 4 minus over 5.  Reflexes are with 2+ in the upper extremities without abnormal tone. Hoffmann sign is negative  Reflexes are diminished in both lower extremities without abnormal tone.  Hip flexors are somewhat weak too and she has some difficulty and getting a chair without use of her upper extremities.  Gait is unsteady. She uses a walker. Tandem gait and Rhombergs tests are not assessed due to safety concerns.  She reports intact sensation in the upper and lower extremities to light touch but has some decreased vibratory sensation in the lower extremities.  Extension of lumbar spine is limited and she reports discomfort in the low back which radiates through both lower extremities posteriorly to the knees.  Cervical range of motion is mildly limited and she reports pain in all planes  Shoulder range of motion is mildly limited bilaterally  She is some tenderness over the left lateral elbow.   Patient has antalgic gait with decreased weight-bearing through right lower extremity.  Internal and external rotation at the right hip aggravates posterior hip pain. She has some trouble with hip flexion on the right side as well compared to the left side.      02/23/10 Lumbar MR  1. No change in moderate central canal stenosis at L4-5 where  there is also left foraminal narrowing.  2. No change in  moderate central canal stenosis and narrowing of  both lateral recesses at L5-S1.  3. New prominent Schmorl's node in the superior plate of L3.  consider ESI if lower extremity pain problematic  Thoracic radiograph 06/16/11 Moderate to severe thoracolumbar scoliosis is again noted  with associated degenerative change. No acute compression  deformity is seen.            Assessment & Plan:  MPRESSION:  1. Gait disorder for several years now, etiology not clear may be  related to cervical stenosis.    Gait disorders are frequently multifactorial in origin. Non-neurologic causes include visual loss, orthopedic disorders, pain, side effects of drugs, and cardiorespiratory problems. These may be the sole problem, or co-exist with neurologic causes.    I would also like to check some blood  work  including B12 to rule out nutritional deficiency as a cause of  this as well.   B12 was above normal range. Pt taking liquid B12 now.  Had been getting shots.     She tells me that she has a neurology appointment now which is scheduled for August of 2013.  Interventions to help minimze falls:  Minimize medications  Improved gait balance mobility lower extremity strength-pt has been in PT     Treatment vision impairment has appt for eye exam next week.   Monitor for postural hypotension.    Assess foot and footwear problems shoes are adequate.  Low heeled with rubber sole adequate.     Modify home environment  I have reviewed this with her and and she tells me she has removed small rugs that she can trip on.  She will also put night lights on at night.   2. Bladder incontinence. The patient is currently following up with  Dr. Perley Jain and is apparently undergoing urodynamic testing.    3. Leg and/arm weakness, etiology not clear, may be related to  cervical stenosis as well. She has had multiple falls, may  consider repeat radiographs of her neck. She has some mid  thoracic  pain with palpation at last visit.    4. Bilateral knee pain with known osteoarthritis per radiographs done  in 2010.  12/29/08 Right knee X ray There is tricompartment joint space narrowing, marginal spur  formation and sharpening of the tibial spines consistent with  degenerative joint disease.  06/16/11 Knee X Ray There is primarily bicompartmental degenerative joint  disease involving the medial compartment and patellofemoral  compartments. There is loss of joint space, sclerosis, and  spurring at these levels. There may be some progression particular  involving the patellofemoral compartments as the films of 2010.  Very minimal spurring is noted from the lateral compartment. No  joint effusion is seen.  I would consider Voltaren Gel but the patient is currently getting benefit from her ibuprofen she takes 3 times a day. I have reviewed the risks and benefits of this medicine with her.      5. Chronic neck and arm pain since 2012 with history of falls.  06/16/11 Cervical Radiographs flexon/extension view: Global decreased range of motion in the lower cervical spine from  C5-C6 inferiorly. Decreased range of motion in flexion. No  abnormal motion. Chronic C5-C6 disc degeneration.   6.Right hip pain which is worse with walking. Pain with internal and external rotation.  We'll obtain hip radiographs to rule out right hip osteoarthritis.      PLAN: We will start out with requesting notes from Dr. Venetia Maxon. Patient will need to fill out a medical release form for neurosurgery to send [place] notes over. This has not been done to date.     I cannot rule out nutritional issues as a possibility for her as well.     Continue to do a home program to maintain gains made in the therapy program.    I will continue her on tramadol at this time. She is using 50 mg twice a day. We will  increase it to 50 mg 3 times a day. She understands that many pain  medications can contribute  to worsening balance, and we would like to  minimize that for her.    She had some questions about ibuprofen. I reviewed the risks and benefits of this medication with her. She has been on it for a while as well.  Gabapentin is currently being used at  300 mg 3 times a day.     8am, 12pm and 6pm.    There may be some room to adjust this medication  as well for her. I am going to add 1 more dose of gabapentin at bedtime. She is reporting some problems sleeping due to leg and back pain.    She takes her tramadol at 10 AM, 2 PM, and 6 PM. I'm going to add 1 more dose of tramadol at bedtime as well. She typically goes to bed around midnight   She understands that that I am adding one more dose of gabapentin each day and 1 more dose of tramadol each day. If she has any problems with increased sedation or gait instability she will return back to her previous dose in schedule   She will also try over-the-counter Capzasion cream on her knees. She did try  over-the-counter Capzasion cream on her knees but it did not help that much. She is comfortable with the current workup at this time.     Her urine drug screen which was done on May 13, 2011. There is no evidence of aberrant behavior  with the use of any of her pain medications.

## 2011-08-23 NOTE — Patient Instructions (Addendum)
We needed your consent to obtain notes from neurosurgery.  I have increased your gabapentin and tramadol very slightly.  Return to your previous schedule of 3 times a day if you have problems with 4 times a day rather than 3.  Maintain contact with her primary care Dr.  Please make sure you keep your pain medications locked up in a secure location  I will see you back in one month  We also talked about having you start some pool therapy. You wanted to go to the [facility] and I support that. Consider water walking or and arthritis water program.  I have ordered hip x-rays for you.

## 2011-09-03 ENCOUNTER — Other Ambulatory Visit (HOSPITAL_COMMUNITY): Payer: Self-pay | Admitting: Gastroenterology

## 2011-09-03 DIAGNOSIS — R112 Nausea with vomiting, unspecified: Secondary | ICD-10-CM

## 2011-09-07 ENCOUNTER — Other Ambulatory Visit: Payer: Self-pay

## 2011-09-07 ENCOUNTER — Telehealth: Payer: Self-pay

## 2011-09-07 MED ORDER — IBUPROFEN 800 MG PO TABS: 800.0000 mg | ORAL_TABLET | Freq: Three times a day (TID) | ORAL | Status: DC | PRN

## 2011-09-07 NOTE — Telephone Encounter (Signed)
Motrin called in earlier.  Pt to call pharamcy.

## 2011-09-07 NOTE — Telephone Encounter (Signed)
Pt called about motrin refill.

## 2011-09-20 ENCOUNTER — Inpatient Hospital Stay (HOSPITAL_COMMUNITY): Admission: RE | Admit: 2011-09-20 | Payer: Medicaid Other | Source: Ambulatory Visit

## 2011-09-22 ENCOUNTER — Encounter: Payer: Medicaid Other | Admitting: Physical Medicine and Rehabilitation

## 2011-09-28 ENCOUNTER — Other Ambulatory Visit (HOSPITAL_COMMUNITY): Payer: Medicaid Other

## 2011-10-06 ENCOUNTER — Other Ambulatory Visit (HOSPITAL_COMMUNITY): Payer: Self-pay | Admitting: Gastroenterology

## 2011-10-06 DIAGNOSIS — R112 Nausea with vomiting, unspecified: Secondary | ICD-10-CM

## 2011-10-11 ENCOUNTER — Other Ambulatory Visit: Payer: Self-pay | Admitting: *Deleted

## 2011-10-11 MED ORDER — IBUPROFEN 800 MG PO TABS: 800.0000 mg | ORAL_TABLET | Freq: Three times a day (TID) | ORAL | Status: DC | PRN

## 2011-10-12 ENCOUNTER — Encounter (HOSPITAL_COMMUNITY)
Admission: RE | Admit: 2011-10-12 | Discharge: 2011-10-12 | Disposition: A | Discharge status: Home / Self Care | Payer: Medicaid Other | Source: Ambulatory Visit | Attending: Gastroenterology | Admitting: Gastroenterology

## 2011-10-12 DIAGNOSIS — R109 Unspecified abdominal pain: Secondary | ICD-10-CM | POA: Insufficient documentation

## 2011-10-12 DIAGNOSIS — R112 Nausea with vomiting, unspecified: Secondary | ICD-10-CM

## 2011-10-12 MED ORDER — TECHNETIUM TC 99M SULFUR COLLOID
2.0000 | Freq: Once | INTRAVENOUS | Status: AC | PRN
  Administered 2011-10-12: 2 via INTRAVENOUS

## 2011-10-22 ENCOUNTER — Encounter: Payer: Self-pay | Admitting: Physical Medicine and Rehabilitation

## 2011-10-22 ENCOUNTER — Encounter: Payer: Medicaid Other | Attending: Neurosurgery | Admitting: Physical Medicine and Rehabilitation

## 2011-10-22 VITALS — BP 114/68 | HR 62 | Resp 16 | Ht 67.0 in | Wt 209.0 lb

## 2011-10-22 DIAGNOSIS — G5793 Unspecified mononeuropathy of bilateral lower limbs: Secondary | ICD-10-CM

## 2011-10-22 DIAGNOSIS — G5790 Unspecified mononeuropathy of unspecified lower limb: Secondary | ICD-10-CM | POA: Insufficient documentation

## 2011-10-22 DIAGNOSIS — G8929 Other chronic pain: Secondary | ICD-10-CM

## 2011-10-22 DIAGNOSIS — R269 Unspecified abnormalities of gait and mobility: Secondary | ICD-10-CM

## 2011-10-22 DIAGNOSIS — M25559 Pain in unspecified hip: Secondary | ICD-10-CM

## 2011-10-22 DIAGNOSIS — M5030 Other cervical disc degeneration, unspecified cervical region: Secondary | ICD-10-CM

## 2011-10-22 DIAGNOSIS — M545 Low back pain, unspecified: Secondary | ICD-10-CM

## 2011-10-22 DIAGNOSIS — M542 Cervicalgia: Secondary | ICD-10-CM

## 2011-10-22 DIAGNOSIS — M25519 Pain in unspecified shoulder: Secondary | ICD-10-CM

## 2011-10-22 MED ORDER — GABAPENTIN 300 MG PO CAPS: 300.0000 mg | ORAL_CAPSULE | Freq: Every day | ORAL | Status: DC

## 2011-10-22 NOTE — Progress Notes (Signed)
Subjective:    Patient ID: [name], female    DOB: 1956-03-02, 56 y.o.   MRN: [id number]  HPI  Patient is a 56 year old woman who has multiple pain complaints. These have been outlined in previous notes. She has a neurology appointment pending and it is scheduled for August of 2013.  She is here today and although she has multiple pain and functional problems her chief complaint today is bilateral leg pain which is worse when she first wakes in the morning and after she has been walking more than 5-10 minutes. The pain in the legs is localized to posterior thigh and into the calf area. She has some tingling in the foot as well.  She reports that her arms neck and shoulders are not as big a problem as the leg pain currently.   Pain Inventory Average Pain 7 Pain Right Now 8 My pain is dull, tingling and aching  In the last 24 hours, has pain interfered with the following? General activity 6 Relation with others 10 Enjoyment of life 10 What TIME of day is your pain at its worst? morning Sleep (in general) Poor  Pain is worse with: walking Pain improves with: medication Relief from Meds: 7  Mobility use a cane use a walker ability to climb steps?  no do you drive?  yes  Function Do you have any goals in this area?  yes  Neuro/Psych bladder control problems bowel control problems numbness trouble walking spasms depression loss of taste or smell  Prior Studies Any changes since last visit?  no  Physicians involved in your care Any changes since last visit?  no   History reviewed. No pertinent family history. History   Social History  . Marital Status: Widowed    Spouse Name: N/A    Number of Children: N/A  . Years of Education: N/A   Social History Main Topics  . Smoking status: Former Smoker    Quit date: 04/19/2009  . Smokeless tobacco: None  . Alcohol Use:   . Drug Use:   . Sexually Active:    Other Topics Concern  . None   Social  History Narrative  . None   History reviewed. No pertinent past surgical history. Past Medical History  Diagnosis Date  . Fibromyalgia   . Diabetes mellitus   . Hypertension   . Depression   . Anxiety    BP 114/68  Pulse 62  Resp 16  Ht 5\' 7"  (1.702 m)  Wt 209 lb (94.802 kg)  BMI 32.73 kg/m2  SpO2 99%     Review of Systems  HENT: Negative.   Eyes: Negative.   Respiratory: Negative.   Cardiovascular: Negative.   Gastrointestinal: Negative.   Genitourinary: Negative.   Musculoskeletal: Positive for gait problem.  Skin: Negative.   Neurological: Positive for weakness and numbness.  Hematological: Negative.   Psychiatric/Behavioral: Negative.        Objective:   Physical Exam  She is an obese woman who appears her stated age and does not appear in any distress. She is oriented x3 speech is clear affect is bright she's alert cooperative and pleasant follows commands with difficulty and his questions appropriately    Cranial nerves are intact. Coordination is good in the upper extremities.   Motor strength is generally good in the upper extremities however she has weak hip flexors bilaterally 4 minus over 5.   Reflexes are with 2+ in the upper extremities without abnormal tone. Hoffmann sign is  negative  Reflexes are diminished in both lower extremities without abnormal tone.   Hip flexors are somewhat weak too and she has some difficulty and getting a chair without use of her upper extremities.   Gait is unsteady. She uses a walker. Tandem gait and Rhombergs tests are not assessed due to safety concerns.    She reports intact sensation in the upper and lower extremities to light touch but has some decreased vibratory sensation in the lower extremities.    Extension of lumbar spine is limited and she reports discomfort in the low back which radiates through both lower extremities posteriorly to the knees.   Cervical range of motion is mildly limited and she  reports pain in all planes  Shoulder range of motion is mildly limited bilaterally    Slow gait  Internal and external rotation at the right hip aggravates posterior hip pain. She has some trouble with hip flexion on the right side as well compared to the left side.    02/23/10 Lumbar MR  1. No change in moderate central canal stenosis at L4-5 where  there is also left foraminal narrowing.  2. No change in moderate central canal stenosis and narrowing of  both lateral recesses at L5-S1.  3. New prominent Schmorl's node in the superior plate of L3.  consider ESI if lower extremity pain problematic  Thoracic radiograph 06/16/11 Moderate to severe thoracolumbar scoliosis is again noted  with associated degenerative change. No acute compression  deformity is seen.    Moderate central stenosis noted on lumbar MRI November 2011 unchanged from previous MRI 2009    Assessment & Plan:  Bilateral leg pain with ambulation likely secondary to lumbar spinal stenosis MRI reports from 2009 in 2011 were reviewed with the patient today.  Increased gabapentin from 300 mg 4 times a day to 300 mg 5 times a day  Start physical therapy to address spinal stenosis symptoms, address gait abnormality  Consider repeat epidural steroid injection  Will give brochure regarding this  Urge followup with primary care and to keep neurology visit in August  Considering referral back to neurosurgery will have primary care involved regarding this. She reports would like to consider another neurosurgical opinion.

## 2011-10-22 NOTE — Patient Instructions (Addendum)
I have ordered physical therapy to help you with your balance, leg strength, leg pain  I have increased your gabapentin from 4 times a day to 5 times a day. If you have any problems with this dose schedule return back to your 4 times a day schedule.  I will see you back in 2 weeks  I have given you a brochure on epidural steroid injection  Please keep your appointment with neurology in August

## 2011-11-01 ENCOUNTER — Other Ambulatory Visit: Payer: Self-pay | Admitting: Gastroenterology

## 2011-11-01 ENCOUNTER — Ambulatory Visit
Admission: RE | Admit: 2011-11-01 | Discharge: 2011-11-01 | Disposition: A | Discharge status: Home / Self Care | Payer: Medicaid Other | Source: Ambulatory Visit | Attending: Gastroenterology | Admitting: Gastroenterology

## 2011-11-01 DIAGNOSIS — R52 Pain, unspecified: Secondary | ICD-10-CM

## 2011-11-03 ENCOUNTER — Ambulatory Visit
Admission: RE | Admit: 2011-11-03 | Discharge: 2011-11-03 | Disposition: A | Discharge status: Home / Self Care | Payer: Medicaid Other | Source: Ambulatory Visit | Attending: Physical Medicine and Rehabilitation | Admitting: Physical Medicine and Rehabilitation

## 2011-11-03 ENCOUNTER — Other Ambulatory Visit: Payer: Self-pay | Admitting: Gastroenterology

## 2011-11-03 ENCOUNTER — Other Ambulatory Visit: Payer: Self-pay | Admitting: Physical Medicine and Rehabilitation

## 2011-11-03 ENCOUNTER — Ambulatory Visit
Admission: RE | Admit: 2011-11-03 | Discharge: 2011-11-03 | Disposition: A | Discharge status: Home / Self Care | Payer: Medicaid Other | Source: Ambulatory Visit | Attending: Gastroenterology | Admitting: Gastroenterology

## 2011-11-03 DIAGNOSIS — M25551 Pain in right hip: Secondary | ICD-10-CM

## 2011-11-03 DIAGNOSIS — M25552 Pain in left hip: Secondary | ICD-10-CM

## 2011-11-03 DIAGNOSIS — R52 Pain, unspecified: Secondary | ICD-10-CM

## 2011-11-03 DIAGNOSIS — G8929 Other chronic pain: Secondary | ICD-10-CM

## 2011-11-05 ENCOUNTER — Other Ambulatory Visit: Payer: Self-pay | Admitting: Gastroenterology

## 2011-11-05 ENCOUNTER — Ambulatory Visit
Admission: RE | Admit: 2011-11-05 | Discharge: 2011-11-05 | Disposition: A | Discharge status: Home / Self Care | Payer: Medicaid Other | Source: Ambulatory Visit | Attending: Gastroenterology | Admitting: Gastroenterology

## 2011-11-05 DIAGNOSIS — K5900 Constipation, unspecified: Secondary | ICD-10-CM

## 2011-11-08 ENCOUNTER — Ambulatory Visit: Payer: Medicaid Other | Attending: Physical Medicine and Rehabilitation | Admitting: Physical Therapy

## 2011-11-08 ENCOUNTER — Ambulatory Visit: Payer: Medicaid Other | Admitting: Physical Therapy

## 2011-11-08 DIAGNOSIS — M545 Low back pain, unspecified: Secondary | ICD-10-CM | POA: Insufficient documentation

## 2011-11-08 DIAGNOSIS — IMO0001 Reserved for inherently not codable concepts without codable children: Secondary | ICD-10-CM | POA: Insufficient documentation

## 2011-11-08 DIAGNOSIS — M2550 Pain in unspecified joint: Secondary | ICD-10-CM | POA: Insufficient documentation

## 2011-11-08 DIAGNOSIS — R293 Abnormal posture: Secondary | ICD-10-CM | POA: Insufficient documentation

## 2011-11-08 DIAGNOSIS — R262 Difficulty in walking, not elsewhere classified: Secondary | ICD-10-CM | POA: Insufficient documentation

## 2011-11-15 ENCOUNTER — Other Ambulatory Visit: Payer: Self-pay | Admitting: *Deleted

## 2011-11-15 MED ORDER — IBUPROFEN 800 MG PO TABS: 800.0000 mg | ORAL_TABLET | Freq: Three times a day (TID) | ORAL | Status: DC | PRN

## 2011-11-22 ENCOUNTER — Ambulatory Visit: Payer: Medicaid Other | Attending: Cardiovascular Disease | Admitting: Physical Therapy

## 2011-11-22 DIAGNOSIS — M545 Low back pain, unspecified: Secondary | ICD-10-CM | POA: Insufficient documentation

## 2011-11-22 DIAGNOSIS — R262 Difficulty in walking, not elsewhere classified: Secondary | ICD-10-CM | POA: Insufficient documentation

## 2011-11-22 DIAGNOSIS — M6281 Muscle weakness (generalized): Secondary | ICD-10-CM | POA: Insufficient documentation

## 2011-11-22 DIAGNOSIS — IMO0001 Reserved for inherently not codable concepts without codable children: Secondary | ICD-10-CM | POA: Insufficient documentation

## 2011-11-22 DIAGNOSIS — M2550 Pain in unspecified joint: Secondary | ICD-10-CM | POA: Insufficient documentation

## 2011-11-29 ENCOUNTER — Encounter: Payer: Medicaid Other | Admitting: Physical Therapy

## 2011-12-06 ENCOUNTER — Encounter: Payer: Medicaid Other | Attending: Neurosurgery | Admitting: Physical Medicine and Rehabilitation

## 2011-12-06 DIAGNOSIS — M48062 Spinal stenosis, lumbar region with neurogenic claudication: Secondary | ICD-10-CM | POA: Insufficient documentation

## 2011-12-08 ENCOUNTER — Encounter: Payer: Self-pay | Admitting: Physical Medicine and Rehabilitation

## 2011-12-08 ENCOUNTER — Encounter (HOSPITAL_BASED_OUTPATIENT_CLINIC_OR_DEPARTMENT_OTHER): Payer: Medicaid Other | Admitting: Physical Medicine and Rehabilitation

## 2011-12-08 VITALS — BP 119/65 | HR 65 | Resp 16 | Ht 67.0 in | Wt 207.0 lb

## 2011-12-08 DIAGNOSIS — M48062 Spinal stenosis, lumbar region with neurogenic claudication: Secondary | ICD-10-CM

## 2011-12-08 MED ORDER — IBUPROFEN 800 MG PO TABS: 800.0000 mg | ORAL_TABLET | Freq: Three times a day (TID) | ORAL | Status: DC | PRN

## 2011-12-08 NOTE — Progress Notes (Signed)
Subjective:    Patient ID: [name], female    DOB: 1955-09-05, 56 y.o.   MRN: [id number]  HPI   Patient is a 56 year old woman who has multiple pain complaints. These have been outlined in previous notes. She had a neurology appointment August of 2013. No notes are available for review, Patient may try to get me results of EMG/NCV?   She is here today and although she has multiple pain and functional problems her chief complaint today is bilateral leg pain which is worse when she first wakes in the morning and after she has been walking more than 5-10 minutes. The pain in the legs is localized to posterior thigh and into the calf area. She has some tingling in the foot as well.    since 201 1she used to walk one hour now .  Water aerobics has helped some and she has gone 2 weeks so far. Also walking track recently, seems like it is helping,could walk about 10 mins.  She reports that her arms neck and shoulders are not as big a problem as the leg pain currently.    Continues to see urologist for problems with UTI, has worn diapers, told has spasms in bladder. Pain Inventory Average Pain 4-5 Pain Right Now 7 My pain is dull, tingling and aching  In the last 24 hours, has pain interfered with the following? General activity 4 Relation with others 5 Enjoyment of life 4 What TIME of day is your pain at its worst? Morning and Night Sleep (in general) Fair  Pain is worse with: walking, inactivity and standing Pain improves with: rest, heat/ice, therapy/exercise and medication Relief from Meds: 6  Mobility use a cane use a walker ability to climb steps?  no do you drive?  yes  Function Do you have any goals in this area?  yes  Neuro/Psych bladder control problems bowel control problems weakness trouble walking dizziness depression loss of taste or smell  Prior Studies Any changes since last visit?  no  Physicians involved in your care Any changes  since last visit?  no   History reviewed. No pertinent family history. History   Social History  . Marital Status: Widowed    Spouse Name: N/A    Number of Children: N/A  . Years of Education: N/A   Social History Main Topics  . Smoking status: Former Smoker    Quit date: 04/19/2009  . Smokeless tobacco: None  . Alcohol Use:   . Drug Use:   . Sexually Active:    Other Topics Concern  . None   Social History Narrative  . None   History reviewed. No pertinent past surgical history. Past Medical History  Diagnosis Date  . Fibromyalgia   . Diabetes mellitus   . Hypertension   . Depression   . Anxiety    BP 119/65  Pulse 65  Resp 16  Ht 5\' 7"  (1.702 m)  Wt 207 lb (93.895 kg)  BMI 32.42 kg/m2  SpO2 97%      Review of Systems  HENT: Negative.   Eyes: Negative.   Respiratory: Negative.   Gastrointestinal: Positive for diarrhea.  Genitourinary: Positive for urgency.  Musculoskeletal: Positive for back pain and gait problem.  Skin: Negative.   Neurological: Positive for weakness.  Psychiatric/Behavioral:       Depression       Objective:   Physical Exam  She is an obese woman who appears her stated age and does not appear  in any distress. She is oriented x3 speech is clear affect is bright she's alert cooperative and pleasant follows commands with difficulty and his questions appropriately  Cranial nerves are intact. Coordination is good in the upper extremities.  Motor strength is generally good in the upper extremities however she has weak hip flexors bilaterally 4 minus over 5.  Reflexes are with 2+ in the upper extremities without abnormal tone. Hoffmann sign is negative  Reflexes are diminished in both lower extremities without abnormal tone.  Hip flexors on the left are somewhat weak too and she has some difficulty and getting a chair without use of her upper extremities.  Gait is unsteady. She uses a walker. Tandem gait and Rhombergs tests are not  assessed due to safety concerns.  She reports intact sensation in the upper and lower extremities to light touch but has some decreased vibratory sensation in both lower extremities, especially lower legs and feet medially.   Extension of lumbar spine is limited and she reports discomfort in the low back which radiates through both lower extremities posteriorly to the knees.  Cervical range of motion is mildly limited and she reports pain in all planes  Shoulder range of motion is mildly limited bilaterally  Slow gait  Internal and external rotation at the right hip aggravates posterior hip pain. She has some trouble with hip flexion on the right side as well compared to the left side.   Scoliosis noted, no spinal tenderness appreciated cervical thoracic or lumbar. Left shoulder higher than right shoulder.     02/23/10 Lumbar MR    1. No change in moderate central canal stenosis at L4-5 where  there is also left foraminal narrowing.  2. No change in moderate central canal stenosis and narrowing of  both lateral recesses at L5-S1.  3. New prominent Schmorl's node in the superior plate of L3.  consider ESI if lower extremity pain problematic  Thoracic radiograph 06/16/11 Moderate to severe thoracolumbar scoliosis is again noted  with associated degenerative change. No acute compression  deformity is seen.  Moderate central stenosis noted on lumbar MRI November 2011 unchanged from previous MRI 2009     Clinical Data: Mid upper back pain, no injury  THORACIC SPINE - 2 VIEW + SWIMMERS  Comparison: CT chest of 02/19/2011  Findings: Moderate to severe thoracolumbar scoliosis is again noted  with associated degenerative change. No acute compression  deformity is seen. No paravertebral soft tissue swelling is noted.  Multiple surgical clips are present overlying the upper abdomen and  an IVC filter is noted.  IMPRESSION:  Moderate to severe thoracolumbar scoliosis with degenerative    change. No acute abnormality.  Original Report Authenticated By: Juline Patch, M.D.      Assessment & Plan:  1. Lumbago with pain radiating through low back and into lower extremities especially with ambulation consistent with diagnosis of lumbar spinal stenosis. Patient had been able to walk about an hour 2 years ago and is down to about 10 minutes now. She's been tried on gabapentin, she's been in physical therapy, and is currently in a pool program. She is interested in considering repeat epidural steroid injection.  2. History of scoliosis documented at age 20 per patient report   Bilateral leg pain with ambulation likely secondary to lumbar spinal stenosis MRI reports from 2009 in 2011 were reviewed with the patient today.     There has been a interval worsening of function with respect to ambulation and pain since  2011. Prior to consideration of repeat epidural steroid injection would like to obtain repeat lumbar MRI.     Increased gabapentin from 300 mg 4 times a day to 300 mg 5 times a day    Urge followup with primary care, urology and neurology appointments.  Considering referral back to neurosurgery will have primary care involved regarding this. She reports would like to consider another neurosurgical opinion. Would like to consider repeat epidural steroid injection.  F/u one month

## 2011-12-08 NOTE — Addendum Note (Signed)
Addended by: Ashok Cordia on: 12/08/2011 10:54 AM   Modules accepted: Orders

## 2011-12-08 NOTE — Patient Instructions (Signed)
Today we've reviewed various treatment options to help you with your low back and leg pain.  I understand your symptoms have worsened over the last 2 years.  I have ordered a lumbar MRI.  I understand you're interested in considering an epidural steroid injection.  You mentioned you saw neurologist. I would like to review the report and previous previous testing.  Please maintain contact with your neurologist urologist and primary care Dr.

## 2011-12-14 ENCOUNTER — Telehealth: Payer: Self-pay | Admitting: Physical Medicine and Rehabilitation

## 2011-12-14 NOTE — Telephone Encounter (Signed)
Does not meet criteria

## 2011-12-14 NOTE — Telephone Encounter (Signed)
MRI Lumbar Spine denied

## 2011-12-15 NOTE — Telephone Encounter (Signed)
Please let patient know MRI denied.  Will discuss options again next visit.

## 2012-01-05 ENCOUNTER — Encounter: Payer: Self-pay | Admitting: Physical Medicine and Rehabilitation

## 2012-01-05 ENCOUNTER — Encounter: Payer: Medicaid Other | Attending: Neurosurgery | Admitting: Physical Medicine and Rehabilitation

## 2012-01-05 VITALS — BP 109/60 | HR 62 | Resp 14 | Wt 206.8 lb

## 2012-01-05 DIAGNOSIS — IMO0001 Reserved for inherently not codable concepts without codable children: Secondary | ICD-10-CM

## 2012-01-05 DIAGNOSIS — G8929 Other chronic pain: Secondary | ICD-10-CM

## 2012-01-05 DIAGNOSIS — M48062 Spinal stenosis, lumbar region with neurogenic claudication: Secondary | ICD-10-CM | POA: Insufficient documentation

## 2012-01-05 DIAGNOSIS — G5790 Unspecified mononeuropathy of unspecified lower limb: Secondary | ICD-10-CM

## 2012-01-05 DIAGNOSIS — M797 Fibromyalgia: Secondary | ICD-10-CM

## 2012-01-05 DIAGNOSIS — M545 Low back pain, unspecified: Secondary | ICD-10-CM

## 2012-01-05 DIAGNOSIS — G5793 Unspecified mononeuropathy of bilateral lower limbs: Secondary | ICD-10-CM

## 2012-01-05 DIAGNOSIS — R52 Pain, unspecified: Secondary | ICD-10-CM

## 2012-01-05 MED ORDER — IBUPROFEN 800 MG PO TABS: 800.0000 mg | ORAL_TABLET | Freq: Three times a day (TID) | ORAL | Status: DC | PRN

## 2012-01-05 NOTE — Progress Notes (Signed)
Subjective:    Patient ID: [name], female    DOB: May 31, 1955, 56 y.o.   MRN: [id number]  HPI  Patient is a 56 year old woman who has multiple pain complaints. These have been outlined in previous notes. She had a neurology appointment August of 2013. No notes are available for review.   She is here today and although she has multiple pain and functional problems her chief complaint today is bilateral leg pain which is worse when she first wakes in the morning and after she has been walking more than 5-10 minutes.   The pain in the legs is localized to posterior thigh and into the calf area. She has some tingling in the foot as well. Since 2011 she used to walk one hour now .   Water aerobics has helped some and she has gone 2 weeks so far.   Also walking track recently, seems like it is helping,could walk about 10 mins.   She reports that her arms neck and shoulders are not as big a problem as the leg pain currently.  Continues to see urologist for problems with UTI, has worn diapers, told has spasms in bladder.    She reports previous epidural steroid injection may have helped in the past.   Pain Inventory Average Pain 4 Pain Right Now 7 My pain is sharp, dull and aching  In the last 24 hours, has pain interfered with the following? General activity 6 Relation with others 5 Enjoyment of life 8 What TIME of day is your pain at its worst? daytime Sleep (in general) Poor  Pain is worse with: walking, bending and standing Pain improves with: rest, heat/ice, therapy/exercise and medication Relief from Meds: 5  Mobility use a cane use a walker how many minutes can you walk? 5 ability to climb steps?  no do you drive?  yes  Function disabled: date disabled  I need assistance with the following:  household duties  Neuro/Psych No problems in this area  Prior Studies Any changes since last visit?  no  Physicians involved in your care Any changes  since last visit?  no   Family History  Problem Relation Age of Onset  . Diabetes Mother   . Heart disease Father   . Hypertension Father    History   Social History  . Marital Status: Widowed    Spouse Name: N/A    Number of Children: N/A  . Years of Education: N/A   Social History Main Topics  . Smoking status: Former Smoker    Quit date: 04/19/2009  . Smokeless tobacco: Never Used  . Alcohol Use: None  . Drug Use: None  . Sexually Active: None   Other Topics Concern  . None   Social History Narrative  . None   History reviewed. No pertinent past surgical history. Past Medical History  Diagnosis Date  . Fibromyalgia   . Diabetes mellitus   . Hypertension   . Depression   . Anxiety    BP 109/60  Pulse 62  Resp 14  Wt 206 lb 12.8 oz (93.804 kg)  SpO2 99%   Review of Systems  Musculoskeletal: Positive for back pain.       Leg pain bilateral  All other systems reviewed and are negative.       Objective:   Physical Exam  She is an obese woman who appears her stated age and does not appear in any distress. She is oriented x3 speech is clear affect  is bright she's alert cooperative and pleasant follows commands with difficulty and his questions appropriately   Cranial nerves are intact. Coordination is good in the upper extremities.   Motor strength is generally good in the upper extremities however she has weak hip flexors bilaterally 3 minus over 5.   Reflexes are with 2+ in the upper extremities without abnormal tone. Hoffmann sign is negative   Reflexes are diminished in both lower extremities without abnormal tone.   Hip extensors are somewhat weak too and she has some difficulty and getting a chair without use of her upper extremities.   Gait is unsteady. She uses a walker.   Tandem gait and Rhombergs tests are not assessed due to safety concerns.   She reports intact sensation in the upper and lower extremities to light touch but has some  decreased vibratory sensation in both lower extremities, especially lower legs and feet medially.    Extension of lumbar spine is limited and she reports discomfort in the low back which radiates through both lower extremities posteriorly to the knees.   Cervical range of motion is mildly limited and she reports pain in all planes  Shoulder range of motion is mildly limited bilaterally   Slow gait  Internal and external rotation at the right hip aggravates posterior hip pain. She has some trouble with hip flexion on the right side as well compared to the left side.   Scoliosis noted, no spinal tenderness appreciated cervical thoracic or lumbar. Left shoulder higher than right shoulder.   02/23/10 Lumbar MR  1. No change in moderate central canal stenosis at L4-5 where  there is also left foraminal narrowing.  2. No change in moderate central canal stenosis and narrowing of  both lateral recesses at L5-S1.  3. New prominent Schmorl's node in the superior plate of L3.  consider ESI if lower extremity pain problematic  Thoracic radiograph 06/16/11 Moderate to severe thoracolumbar scoliosis is again noted  with associated degenerative change. No acute compression  deformity is seen.  Moderate central stenosis noted on lumbar MRI November 2011 unchanged from previous MRI 2009  Clinical Data: Mid upper back pain, no injury    THORACIC SPINE - 2 VIEW + SWIMMERS  Comparison: CT chest of 02/19/2011  Findings: Moderate to severe thoracolumbar scoliosis is again noted  with associated degenerative change. No acute compression  deformity is seen. No paravertebral soft tissue swelling is noted.  Multiple surgical clips are present overlying the upper abdomen and  an IVC filter is noted.  IMPRESSION:  Moderate to severe thoracolumbar scoliosis with degenerative  change. No acute abnormality.  Original Report Authenticated By: Juline Patch, M.D.        Assessment & Plan:  1. Lumbago with  pain radiating through low back and into lower extremities especially with ambulation consistent with diagnosis of lumbar spinal stenosis. Patient had been able to walk about an hour 2 years ago and is down to about 10 minutes now. She's been tried on gabapentin, she's been in physical therapy, and is currently in a pool program. She is interested in considering repeat epidural steroid injection.   Will obtain previous notes by Dr. Alvester Morin to confirm previous injections were epidural steroid injections.   2. History of scoliosis documented at age 21 per patient report  Bilateral leg pain with ambulation likely secondary to lumbar spinal stenosis MRI reports from 2009 in 2011 were reviewed with the patient today.   There has been a interval worsening  of function with respect to ambulation and pain since 2011.   Prior to consideration of repeat epidural steroid injection would like to obtain repeat lumbar MRI.   Plan. Ibuprofen 800 mg 3 times a day refilled, risks and benefits reviewed, patient reports difficulty functioning without ibuprofen. She will try to cut them in half to try to decrease dosage.  Will review Dr. Hereford Blas notes  Will consider MRI lumbar spine to evaluate for progression prior to epidural steroid injection  Patient is also considering medial branch blocks. We will discuss further at next visit.  She continues to use gabapentin and tramadol and notes benefit with these medications.  Followup in one month

## 2012-01-05 NOTE — Patient Instructions (Signed)
As a patient currently in my care I am recommending that you participate in a water arthritis pool program to help you with leg strength, flexability and endurance.  It is my understanding that your primary care Dr. has also recommended a similar program. If you have any questions whether you are healthy enough to participate please followup with Dr.Kadakia.  Followup in one month.

## 2012-01-07 ENCOUNTER — Ambulatory Visit: Payer: Medicaid Other | Admitting: Physical Medicine and Rehabilitation

## 2012-01-13 ENCOUNTER — Emergency Department (HOSPITAL_COMMUNITY): Payer: Medicaid Other

## 2012-01-13 ENCOUNTER — Emergency Department (HOSPITAL_COMMUNITY)
Admission: EM | Admit: 2012-01-13 | Discharge: 2012-01-13 | Disposition: A | Discharge status: Home / Self Care | Payer: Medicaid Other | Attending: Emergency Medicine | Admitting: Emergency Medicine

## 2012-01-13 ENCOUNTER — Encounter (HOSPITAL_COMMUNITY): Payer: Self-pay | Admitting: Neurology

## 2012-01-13 DIAGNOSIS — K529 Noninfective gastroenteritis and colitis, unspecified: Secondary | ICD-10-CM

## 2012-01-13 DIAGNOSIS — I10 Essential (primary) hypertension: Secondary | ICD-10-CM | POA: Insufficient documentation

## 2012-01-13 DIAGNOSIS — K5289 Other specified noninfective gastroenteritis and colitis: Secondary | ICD-10-CM | POA: Insufficient documentation

## 2012-01-13 DIAGNOSIS — Z79899 Other long term (current) drug therapy: Secondary | ICD-10-CM | POA: Insufficient documentation

## 2012-01-13 DIAGNOSIS — IMO0001 Reserved for inherently not codable concepts without codable children: Secondary | ICD-10-CM | POA: Insufficient documentation

## 2012-01-13 LAB — CBC WITH DIFFERENTIAL/PLATELET
Basophils Absolute: 0 10*3/uL (ref 0.0–0.1)
Basophils Relative: 0 % (ref 0–1)
Eosinophils Absolute: 0 10*3/uL (ref 0.0–0.7)
Eosinophils Relative: 1 % (ref 0–5)
Lymphs Abs: 1 10*3/uL (ref 0.7–4.0)
MCH: 28.3 pg (ref 26.0–34.0)
MCHC: 32.5 g/dL (ref 30.0–36.0)
MCV: 87.2 fL (ref 78.0–100.0)
Neutrophils Relative %: 84 % — ABNORMAL HIGH (ref 43–77)
Platelets: 249 10*3/uL (ref 150–400)
RDW: 17 % — ABNORMAL HIGH (ref 11.5–15.5)

## 2012-01-13 LAB — URINALYSIS, ROUTINE W REFLEX MICROSCOPIC
Hgb urine dipstick: NEGATIVE
Nitrite: NEGATIVE
Specific Gravity, Urine: 1.018 (ref 1.005–1.030)
Urobilinogen, UA: 0.2 mg/dL (ref 0.0–1.0)
pH: 5.5 (ref 5.0–8.0)

## 2012-01-13 LAB — COMPREHENSIVE METABOLIC PANEL
ALT: 16 U/L (ref 0–35)
Albumin: 3.1 g/dL — ABNORMAL LOW (ref 3.5–5.2)
Calcium: 8.6 mg/dL (ref 8.4–10.5)
GFR calc Af Amer: 71 mL/min — ABNORMAL LOW (ref >90)
Glucose, Bld: 114 mg/dL — ABNORMAL HIGH (ref 70–99)
Potassium: 3.9 mEq/L (ref 3.5–5.1)
Sodium: 144 mEq/L (ref 135–145)
Total Protein: 6 g/dL (ref 6.0–8.3)

## 2012-01-13 MED ORDER — SODIUM CHLORIDE 0.9 % IV BOLUS (SEPSIS)
1000.0000 mL | Freq: Once | INTRAVENOUS | Status: AC
  Administered 2012-01-13: 1000 mL via INTRAVENOUS

## 2012-01-13 MED ORDER — MORPHINE SULFATE 4 MG/ML IJ SOLN
4.0000 mg | Freq: Once | INTRAMUSCULAR | Status: AC
  Administered 2012-01-13: 4 mg via INTRAVENOUS
  Filled 2012-01-13: qty 1

## 2012-01-13 MED ORDER — ONDANSETRON HCL 4 MG PO TABS: 4.0000 mg | ORAL_TABLET | Freq: Four times a day (QID) | ORAL | Status: DC

## 2012-01-13 MED ORDER — ONDANSETRON HCL 4 MG/2ML IJ SOLN
4.0000 mg | Freq: Once | INTRAMUSCULAR | Status: AC
  Administered 2012-01-13: 4 mg via INTRAVENOUS
  Filled 2012-01-13: qty 2

## 2012-01-13 MED ORDER — ONDANSETRON 4 MG PO TBDP
8.0000 mg | ORAL_TABLET | Freq: Once | ORAL | Status: AC
  Administered 2012-01-13: 8 mg via ORAL
  Filled 2012-01-13: qty 2

## 2012-01-13 NOTE — ED Notes (Signed)
PT'S. SISTER NOTIFIED ON PT'S DISCHARGE PLAN / NEEDS TRANSPORTATION BACK HOME.

## 2012-01-13 NOTE — ED Notes (Signed)
Pt reporting left side abdominal pain, n/v/d since Sunday. Unable to keep food/drink down. Pt reporting abdominal pain 8/10. C/o nausea. Pt a x 4. Skin warm and dry.

## 2012-01-13 NOTE — ED Provider Notes (Signed)
History     CSN: [id number]  Arrival date & time 01/13/12  1559   First MD Initiated Contact with Patient 01/13/12 1604      Chief Complaint  Patient presents with  . Emesis  . Nausea  . Abdominal Pain    (Consider location/radiation/quality/duration/timing/severity/associated sxs/prior treatment) Patient is a 56 y.o. female presenting with vomiting and abdominal pain.  Emesis  Associated symptoms include abdominal pain, chills, diarrhea and myalgias. Pertinent negatives include no fever and no headaches.  Abdominal Pain The primary symptoms of the illness include abdominal pain, fatigue, nausea and diarrhea. The primary symptoms of the illness do not include fever, shortness of breath or dysuria.  Additional symptoms associated with the illness include chills.   Pt has had 4 days of nausea vomiting and diarrhea that improved yesterday and worsened this AM. She has vomited multiple times and had loose stool x 3-4. No fever, +chills and body aches. Pt c/o generalized abdominal cramping better after vomiting or defecation. No blood in either vomit or stool. No sick contacts, travel or dietary changes. Pt take Ultram on regular basis and states she has not had it for 1 day Past Medical History  Diagnosis Date  . Fibromyalgia   . Hypertension   . Depression   . Anxiety     History reviewed. No pertinent past surgical history.  Family History  Problem Relation Age of Onset  . Diabetes Mother   . Heart disease Father   . Hypertension Father     History  Substance Use Topics  . Smoking status: Former Smoker    Quit date: 04/19/2009  . Smokeless tobacco: Never Used  . Alcohol Use: No    OB History    Grav Para Term Preterm Abortions TAB SAB Ect Mult Living                  Review of Systems  Constitutional: Positive for chills and fatigue. Negative for fever.  Respiratory: Negative for shortness of breath.   Cardiovascular: Negative for chest pain.    Gastrointestinal: Positive for nausea, abdominal pain and diarrhea. Negative for blood in stool and anal bleeding.  Genitourinary: Negative for dysuria.  Musculoskeletal: Positive for myalgias.  Skin: Negative for rash and wound.  Neurological: Negative for dizziness, light-headedness, numbness and headaches.    Allergies  Sulfonamide derivatives  Home Medications   Current Outpatient Rx  Name Route Sig Dispense Refill  . VITAMIN C PO Oral Take 1 tablet by mouth daily.      . ASPIRIN EC 81 MG PO TBEC Oral Take 81 mg by mouth daily.      Marland Kitchen CITALOPRAM HYDROBROMIDE 20 MG PO TABS Oral Take 20 mg by mouth daily.      . CYCLOBENZAPRINE HCL 10 MG PO TABS Oral Take 10 mg by mouth at bedtime as needed. For spasms     . DICYCLOMINE HCL 10 MG PO CAPS Oral Take 10 mg by mouth 4 (four) times daily.      Marland Kitchen FERROUS SULFATE 325 (65 FE) MG PO TABS Oral Take 325 mg by mouth daily.      . FESOTERODINE FUMARATE ER 4 MG PO TB24 Oral Take 4 mg by mouth daily.    . FUROSEMIDE 40 MG PO TABS Oral Take 40 mg by mouth daily as needed. For swelling    . GABAPENTIN 300 MG PO CAPS Oral Take 1 capsule (300 mg total) by mouth 5 (five) times daily. 150 capsule 1  .  IBUPROFEN 800 MG PO TABS Oral Take 1 tablet (800 mg total) by mouth every 8 (eight) hours as needed for pain (minimize the use of this medicine to decrease side effect). For pain/inflammation. 90 tablet 0  . LISINOPRIL-HYDROCHLOROTHIAZIDE 10-12.5 MG PO TABS Oral Take 1 tablet by mouth every other day.     Marland Kitchen METOPROLOL TARTRATE 25 MG PO TABS Oral Take 12.5 mg by mouth 2 (two) times daily.      Carma Leaven M PLUS PO TABS Oral Take 1 tablet by mouth daily.      Marland Kitchen NITROGLYCERIN 0.4 MG SL SUBL Sublingual Place 0.4 mg under the tongue every 5 (five) minutes as needed. For chest pain x 3 doses.     . OMEPRAZOLE 20 MG PO CPDR Oral Take 20 mg by mouth daily.      Marland Kitchen POTASSIUM CHLORIDE CRYS ER 20 MEQ PO TBCR Oral Take 20 mEq by mouth 2 (two) times daily.      . TRAMADOL  HCL 50 MG PO TABS Oral Take 50 mg by mouth every 6 (six) hours as needed. For pain    . ONDANSETRON HCL 4 MG PO TABS Oral Take 1 tablet (4 mg total) by mouth every 6 (six) hours. 12 tablet 0    BP 110/64  Pulse 53  Temp 98.3 F (36.8 C) (Oral)  Resp 16  SpO2 98%  Physical Exam  Nursing note and vitals reviewed. Constitutional: She is oriented to person, place, and time. She appears well-developed and well-nourished.  HENT:  Head: Normocephalic and atraumatic.  Mouth/Throat: Oropharynx is clear and moist.  Eyes: EOM are normal. Pupils are equal, round, and reactive to light.  Neck: Normal range of motion. Neck supple.  Cardiovascular: Normal rate and regular rhythm.   Pulmonary/Chest: Effort normal and breath sounds normal. No respiratory distress. She has no wheezes. She has no rales.  Abdominal: Soft. Bowel sounds are normal. She exhibits no distension. There is tenderness (Generalized abd tenderness with palpation without focality. Voluntary guarding. no rebound).  Musculoskeletal: Normal range of motion. She exhibits no edema and no tenderness.  Neurological: She is alert and oriented to person, place, and time.       Moves all ext without deficit  Skin: Skin is warm and dry. No rash noted. No erythema.  Psychiatric:       Labile behavior. Calm on stretcher while waiting for bed and then begins crying when I enter the room.     ED Course  Procedures (including critical care time)  Labs Reviewed  CBC WITH DIFFERENTIAL - Abnormal; Notable for the following:    RBC 3.67 (*)     Hemoglobin 10.4 (*)     HCT 32.0 (*)     RDW 17.0 (*)     Neutrophils Relative 84 (*)     Lymphocytes Relative 11 (*)     All other components within normal limits  COMPREHENSIVE METABOLIC PANEL - Abnormal; Notable for the following:    Chloride 115 (*)     Glucose, Bld 114 (*)     Albumin 3.1 (*)     Alkaline Phosphatase 270 (*)     Total Bilirubin 0.2 (*)     GFR calc non Af Amer 62 (*)      GFR calc Af Amer 71 (*)     All other components within normal limits  LIPASE, BLOOD  URINALYSIS, ROUTINE W REFLEX MICROSCOPIC   Dg Abd Acute W/chest  01/13/2012  *RADIOLOGY REPORT*  Clinical  Data: Abdominal pain, nausea, vomiting and diarrhea.  ACUTE ABDOMEN SERIES (ABDOMEN 2 VIEW & CHEST 1 VIEW)  Comparison: 11/03/2011.  Findings: The patient is severely rotated to the right causing gross distortion of cardiomediastinal silhouette.  With this limitation in mind, lung volumes appear normal, and there is no definite consolidative airspace disease or pleural effusions. Linear opacity in the left midlung is similar to prior examinations, favored to represent scarring.  No definite pulmonary edema.  Borderline cardiomegaly.  Supine and upright views of the abdomen demonstrate numerous surgical clips projecting over the central abdomen.  An IVC filter is in place with tip projecting at the L2-L3 interspace.  Markers from the surgical mesh are noted, likely from the abdominal wall hernia repair.  There is some gas and stool scattered throughout the colon extending to the distal rectum.  No definite pathologic distension of small bowel is noted.  No gross evidence of pneumoperitoneum.  IMPRESSION: 1.  Nonobstructive bowel gas pattern. 2.  No pneumoperitoneum. 3.  Extensive postoperative changes in the abdomen, as above. 4.  Limited evaluation of the chest demonstrating no definite radiographic evidence of acute cardiopulmonary disease.   Original Report Authenticated By: Florencia Reasons, M.D.      1. Gastroenteritis       MDM   Pt states she feels better. No vomiting in ED. abd soft without guarding. Encouraged to return for worsening symptoms.       Loren Racer, MD 01/13/12 2111

## 2012-01-13 NOTE — ED Notes (Signed)
Pt notified need for urine sample, reporting can't go at this time. Notified need urine now.

## 2012-01-13 NOTE — ED Notes (Signed)
Pt sister, Kyra Searles. Please contact upon discharge [phone] or [phone]

## 2012-01-13 NOTE — ED Notes (Signed)
INCONTINENT CARE PROVIDED , REPOSITIONED FOR COMFORT .

## 2012-01-13 NOTE — ED Notes (Signed)
Pt arrives to the ED via [facility] for eval of N/V/D, abdominal pain, chills, and flu-like symptoms that started this am. EKG unremarkable per EMS. Per EMS pt denied any blood in the emesis.

## 2012-01-13 NOTE — ED Notes (Signed)
Patient transported to X-ray 

## 2012-01-17 ENCOUNTER — Other Ambulatory Visit: Payer: Self-pay | Admitting: *Deleted

## 2012-01-17 MED ORDER — IBUPROFEN 800 MG PO TABS: 800.0000 mg | ORAL_TABLET | Freq: Three times a day (TID) | ORAL | Status: DC | PRN

## 2012-02-02 ENCOUNTER — Encounter: Payer: Self-pay | Admitting: Physical Medicine and Rehabilitation

## 2012-02-02 ENCOUNTER — Encounter: Payer: Medicaid Other | Attending: Neurosurgery | Admitting: Physical Medicine and Rehabilitation

## 2012-02-02 VITALS — BP 120/56 | HR 73 | Resp 14 | Ht 67.0 in | Wt 205.0 lb

## 2012-02-02 DIAGNOSIS — G5793 Unspecified mononeuropathy of bilateral lower limbs: Secondary | ICD-10-CM

## 2012-02-02 DIAGNOSIS — M25551 Pain in right hip: Secondary | ICD-10-CM

## 2012-02-02 DIAGNOSIS — M542 Cervicalgia: Secondary | ICD-10-CM

## 2012-02-02 DIAGNOSIS — M25559 Pain in unspecified hip: Secondary | ICD-10-CM

## 2012-02-02 DIAGNOSIS — M48062 Spinal stenosis, lumbar region with neurogenic claudication: Secondary | ICD-10-CM | POA: Insufficient documentation

## 2012-02-02 DIAGNOSIS — M545 Low back pain, unspecified: Secondary | ICD-10-CM

## 2012-02-02 DIAGNOSIS — M25519 Pain in unspecified shoulder: Secondary | ICD-10-CM

## 2012-02-02 DIAGNOSIS — G8929 Other chronic pain: Secondary | ICD-10-CM

## 2012-02-02 DIAGNOSIS — M25512 Pain in left shoulder: Secondary | ICD-10-CM

## 2012-02-02 DIAGNOSIS — M5030 Other cervical disc degeneration, unspecified cervical region: Secondary | ICD-10-CM

## 2012-02-02 DIAGNOSIS — G5790 Unspecified mononeuropathy of unspecified lower limb: Secondary | ICD-10-CM

## 2012-02-02 MED ORDER — IBUPROFEN 800 MG PO TABS: 800.0000 mg | ORAL_TABLET | Freq: Three times a day (TID) | ORAL | Status: DC | PRN

## 2012-02-02 MED ORDER — TRAMADOL HCL 50 MG PO TABS: 50.0000 mg | ORAL_TABLET | Freq: Four times a day (QID) | ORAL | Status: DC | PRN

## 2012-02-02 NOTE — Progress Notes (Signed)
Subjective:    Patient ID: [name], female    DOB: 1955-08-19, 56 y.o.   MRN: [id number]  HPI Patient is a 56 year old woman who has multiple pain complaints. [name] with chronic low back and neck pain, moderate to severe thoracic scoliosis, cervical degenerative disc disease in 2012 seen by Dr. Venetia Maxon, bilateral leg pain consistent with spinal stenosis.  Patient is back in today and reports episode of  gastroenteritis. She was seen in the emergency room. For the last several weeks she has had little energy and has a followup appointment with primary care next week.  She is here today for refill of tramadol  and ibuprofen.  A couple weeks ago she stopped going to her pool program due to decreased energy levels.        Pain Inventory Average Pain 7 Pain Right Now 9 My pain is intermittent  In the last 24 hours, has pain interfered with the following? General activity 7 Relation with others 7 Enjoyment of life 7 What TIME of day is your pain at its worst? varies Sleep (in general) Fair  Pain is worse with: walking and sitting Pain improves with: rest and heat/ice Relief from Meds: 5  Mobility walk with assistance use a cane how many minutes can you walk? 5 ability to climb steps?  no do you drive?  yes Do you have any goals in this area?  yes  Function not employed: date last employed  Do you have any goals in this area?  yes  Neuro/Psych bladder control problems bowel control problems weakness depression  Prior Studies Any changes since last visit?  no  Physicians involved in your care Any changes since last visit?  no   Family History  Problem Relation Age of Onset  . Diabetes Mother   . Heart disease Father   . Hypertension Father    History   Social History  . Marital Status: Widowed    Spouse Name: N/A    Number of Children: N/A  . Years of Education: N/A   Social History Main Topics  . Smoking  status: Former Smoker    Quit date: 04/19/2009  . Smokeless tobacco: Never Used  . Alcohol Use: No  . Drug Use: No  . Sexually Active: None   Other Topics Concern  . None   Social History Narrative  . None   History reviewed. No pertinent past surgical history. Past Medical History  Diagnosis Date  . Fibromyalgia   . Hypertension   . Depression   . Anxiety    BP 120/56  Pulse 73  Resp 14  Ht 5\' 7"  (1.702 m)  Wt 205 lb (92.987 kg)  BMI 32.11 kg/m2  SpO2 95%     Review of Systems  Musculoskeletal: Positive for back pain, arthralgias and gait problem.  Neurological: Positive for weakness.  Psychiatric/Behavioral: Positive for dysphoric mood.  All other systems reviewed and are negative.       Objective:   Physical Exam  She is an obese woman who appears her stated age and does not appear in any distress. She is oriented x3 speech is clear affect is bright she's alert cooperative and pleasant follows commands with difficulty and his questions appropriately  Cranial nerves are intact. Coordination is good in the upper extremities.  Motor strength is generally good in the upper extremities however she has weak hip flexors bilaterally 3 minus over 5.  Reflexes are with 2+ in the upper extremities  without abnormal tone. Hoffmann sign is negative  Reflexes are diminished in both lower extremities without abnormal tone.  Hip extensors are somewhat weak too and she has some difficulty and getting a chair without use of her upper extremities.  Gait is unsteady. She uses a walker.    Tandem gait and Rhombergs tests are not assessed due to safety concerns.   She reports intact sensation in the upper and lower extremities to light touch but has some decreased vibratory sensation in both lower extremities, especially lower legs and feet medially.   Extension of lumbar spine is limited and she reports discomfort in the low back which radiates through both lower extremities  posteriorly to the knees.   Cervical range of motion is mildly limited and she reports pain in all planes   Shoulder range of motion is mildly limited bilaterally   Slow gait   Internal and external rotation at the right hip aggravates posterior hip pain. She has some trouble with hip flexion on the right side as well compared to the left side.   Scoliosis noted, no spinal tenderness appreciated cervical thoracic or lumbar. Left shoulder higher than right shoulder.     02/23/10 Lumbar MR  1. No change in moderate central canal stenosis at L4-5 where  there is also left foraminal narrowing.  2. No change in moderate central canal stenosis and narrowing of  both lateral recesses at L5-S1.  3. New prominent Schmorl's node in the superior plate of L3.  consider ESI if lower extremity pain problematic  Thoracic radiograph 06/16/11 Moderate to severe thoracolumbar scoliosis is again noted  with associated degenerative change. No acute compression  deformity is seen.  Moderate central stenosis noted on lumbar MRI November 2011 unchanged from previous MRI 2009  Clinical Data: Mid upper back pain, no injury  THORACIC SPINE - 2 VIEW + SWIMMERS  Comparison: CT chest of 02/19/2011  Findings: Moderate to severe thoracolumbar scoliosis is again noted  with associated degenerative change. No acute compression  deformity is seen. No paravertebral soft tissue swelling is noted.  Multiple surgical clips are present overlying the upper abdomen and  an IVC filter is noted.  IMPRESSION:  Moderate to severe thoracolumbar scoliosis with degenerative  change. No acute abnormality.  Original Report Authenticated By: Juline Patch, M.D.        Assessment & Plan:  1. Lumbago with pain radiating through low back and into lower extremities especially with ambulation consistent with diagnosis of lumbar spinal stenosis. Patient had been able to walk about an hour 2 years ago and is down to about 10  minutes now.   She's been tried on gabapentin, she's been in physical therapy, and is currently in a pool program. She is interested in considering repeat epidural steroid injection.   Will obtain previous notes by Dr. Alvester Morin to confirm previous injections were epidural steroid injections.   2. History of scoliosis documented at age 27 per patient report   3. Bilateral leg pain with ambulation likely secondary to lumbar spinal stenosis MRI reports from 2009 in 2011 were reviewed with the patient today.   There has been a interval worsening of function with respect to ambulation and pain since 2011.     Ibuprofen 800 mg 3 times a day refilled, risks and benefits reviewed, patient reports difficulty  functioning without ibuprofen.    She will try to cut them in half to try to decrease dosage.   Will review Dr. Rockford Blas notes  Will consider MRI lumbar spine to evaluate for progression prior to epidural steroid injection   Patient is also considering medial branch blocks. We will discuss further at next visit.   She continues to use gabapentin and tramadol and notes benefit with these medications. Both of these were refill for her today.  Encourage follow up with primary care asked week as planned. Followup in one month

## 2012-02-02 NOTE — Patient Instructions (Signed)
I have refilled your tramadol and your ibuprofen today.  Would like to obtain notes regarding your previous injections.  I will see you back in one month.  Please maintain contact with your primary care doctor, I understand you have a visit with him next week.

## 2012-02-10 ENCOUNTER — Emergency Department (HOSPITAL_COMMUNITY)
Admission: EM | Admit: 2012-02-10 | Discharge: 2012-02-11 | Disposition: A | Discharge status: Home / Self Care | Payer: Medicaid Other | Attending: Emergency Medicine | Admitting: Emergency Medicine

## 2012-02-10 ENCOUNTER — Encounter (HOSPITAL_COMMUNITY): Payer: Self-pay | Admitting: Emergency Medicine

## 2012-02-10 ENCOUNTER — Emergency Department (HOSPITAL_COMMUNITY): Payer: Medicaid Other

## 2012-02-10 DIAGNOSIS — Z8659 Personal history of other mental and behavioral disorders: Secondary | ICD-10-CM | POA: Insufficient documentation

## 2012-02-10 DIAGNOSIS — Z87891 Personal history of nicotine dependence: Secondary | ICD-10-CM | POA: Insufficient documentation

## 2012-02-10 DIAGNOSIS — IMO0001 Reserved for inherently not codable concepts without codable children: Secondary | ICD-10-CM | POA: Insufficient documentation

## 2012-02-10 DIAGNOSIS — I10 Essential (primary) hypertension: Secondary | ICD-10-CM | POA: Insufficient documentation

## 2012-02-10 DIAGNOSIS — Z79899 Other long term (current) drug therapy: Secondary | ICD-10-CM | POA: Insufficient documentation

## 2012-02-10 DIAGNOSIS — R112 Nausea with vomiting, unspecified: Secondary | ICD-10-CM

## 2012-02-10 DIAGNOSIS — K3184 Gastroparesis: Secondary | ICD-10-CM

## 2012-02-10 DIAGNOSIS — Z9884 Bariatric surgery status: Secondary | ICD-10-CM | POA: Insufficient documentation

## 2012-02-10 DIAGNOSIS — Z7982 Long term (current) use of aspirin: Secondary | ICD-10-CM | POA: Insufficient documentation

## 2012-02-10 LAB — BASIC METABOLIC PANEL
CO2: 19 mEq/L (ref 19–32)
Calcium: 8.5 mg/dL (ref 8.4–10.5)
GFR calc non Af Amer: 74 mL/min — ABNORMAL LOW (ref >90)
Sodium: 140 mEq/L (ref 135–145)

## 2012-02-10 LAB — CBC WITH DIFFERENTIAL/PLATELET
Basophils Absolute: 0 10*3/uL (ref 0.0–0.1)
Eosinophils Relative: 1 % (ref 0–5)
Lymphocytes Relative: 17 % (ref 12–46)
MCV: 87 fL (ref 78.0–100.0)
Platelets: 284 10*3/uL (ref 150–400)
RDW: 16.3 % — ABNORMAL HIGH (ref 11.5–15.5)
WBC: 8.7 10*3/uL (ref 4.0–10.5)

## 2012-02-10 LAB — HEPATIC FUNCTION PANEL
Albumin: 3.4 g/dL — ABNORMAL LOW (ref 3.5–5.2)
Alkaline Phosphatase: 239 U/L — ABNORMAL HIGH (ref 39–117)
Total Protein: 7 g/dL (ref 6.0–8.3)

## 2012-02-10 LAB — PHOSPHORUS: Phosphorus: 3.8 mg/dL (ref 2.3–4.6)

## 2012-02-10 LAB — MAGNESIUM: Magnesium: 2.3 mg/dL (ref 1.5–2.5)

## 2012-02-10 LAB — LIPASE, BLOOD: Lipase: 19 U/L (ref 11–59)

## 2012-02-10 MED ORDER — SODIUM CHLORIDE 0.9 % IV SOLN
Freq: Once | INTRAVENOUS | Status: AC
  Administered 2012-02-10: 23:00:00 via INTRAVENOUS

## 2012-02-10 MED ORDER — HYDROMORPHONE HCL PF 1 MG/ML IJ SOLN
1.0000 mg | Freq: Once | INTRAMUSCULAR | Status: AC
  Administered 2012-02-10: 1 mg via INTRAVENOUS
  Filled 2012-02-10: qty 1

## 2012-02-10 MED ORDER — SODIUM CHLORIDE 0.9 % IV BOLUS (SEPSIS)
1000.0000 mL | Freq: Once | INTRAVENOUS | Status: AC
  Administered 2012-02-10: 1000 mL via INTRAVENOUS

## 2012-02-10 MED ORDER — ONDANSETRON HCL 4 MG/2ML IJ SOLN
4.0000 mg | Freq: Once | INTRAMUSCULAR | Status: AC
  Administered 2012-02-10: 4 mg via INTRAVENOUS
  Filled 2012-02-10: qty 2

## 2012-02-10 NOTE — ED Notes (Addendum)
Pt complains of generalized abdominal pain, tender on palpation.  N/V/D x 2 days. States GI doctor "with [facility] put me on Reglan with no relief. Denies SOB, chest pain, and other pain. Actively vomiting during assessment. A.O. X 4. Vitals stable.

## 2012-02-10 NOTE — ED Notes (Signed)
PT. REPORTS EMESIS AND DIARRHEA WITH CHILLS AND LOW GRADE FEVER ONSET YESTERDAY. MILD ABDOMINAL CRAMPING.

## 2012-02-10 NOTE — ED Notes (Signed)
Pt returned from X-ray.  

## 2012-02-10 NOTE — ED Notes (Signed)
Patient transported to X-ray 

## 2012-02-11 LAB — URINALYSIS, ROUTINE W REFLEX MICROSCOPIC
Leukocytes, UA: NEGATIVE
Nitrite: NEGATIVE
Protein, ur: NEGATIVE mg/dL
Urobilinogen, UA: 0.2 mg/dL (ref 0.0–1.0)

## 2012-02-11 MED ORDER — ONDANSETRON 4 MG PO TBDP
8.0000 mg | ORAL_TABLET | Freq: Once | ORAL | Status: AC
  Administered 2012-02-11: 8 mg via ORAL
  Filled 2012-02-11: qty 2

## 2012-02-11 MED ORDER — HYDROCODONE-ACETAMINOPHEN 5-325 MG PO TABS
2.0000 | ORAL_TABLET | Freq: Once | ORAL | Status: AC
  Administered 2012-02-11: 2 via ORAL
  Filled 2012-02-11: qty 2

## 2012-02-11 MED ORDER — METOCLOPRAMIDE HCL 5 MG/ML IJ SOLN
10.0000 mg | Freq: Once | INTRAMUSCULAR | Status: AC
  Administered 2012-02-11: 10 mg via INTRAVENOUS
  Filled 2012-02-11: qty 2

## 2012-02-11 NOTE — ED Notes (Signed)
A.O. X 4. NAD. Vitals stable. Denies pain. Denies nausea. Verbalized understanding of need to seek additional treatment. No further questions at this time.

## 2012-02-11 NOTE — ED Provider Notes (Signed)
History     CSN: [id number]  Arrival date & time 02/10/12  2119   First MD Initiated Contact with Patient 02/10/12 2210      Chief Complaint  Patient presents with  . Emesis  . Diarrhea    (Consider location/radiation/quality/duration/timing/severity/associated sxs/prior treatment) HPI Comments: Pt comes in with cc of nausea and emesis with some diarrhea, Pt has been having similar sx for the past several weeks and has been to the ER 2 occasions. Pt has also seen Dr. Dulce Sellar, GI - and the working diagnosis is that she has gastroparesis, and was started on reglan. Pt reports that despite her taking reglan and zofran ODT, she continues to have about 5-10 episodes of emesis daily, and is unable to keep food or meds down. Emesis is clear, non bilious and non bloody. Pt also has some abd pain - moderate, periumbilical and non radiating - unchanged from before. She also has loose BM, non bloody. Pt has hx of by gastric pass surgery. No fevers, chills.  Patient is a 56 y.o. female presenting with vomiting and diarrhea. The history is provided by the patient.  Emesis  Associated symptoms include abdominal pain and diarrhea. Pertinent negatives include no headaches.  Diarrhea The primary symptoms include abdominal pain, nausea, vomiting and diarrhea. Primary symptoms do not include dysuria.    Past Medical History  Diagnosis Date  . Fibromyalgia   . Hypertension   . Depression   . Anxiety     Past Surgical History  Procedure Date  . Gastric by-pass   . Hernia repair     Family History  Problem Relation Age of Onset  . Diabetes Mother   . Heart disease Father   . Hypertension Father     History  Substance Use Topics  . Smoking status: Former Smoker    Quit date: 04/19/2009  . Smokeless tobacco: Never Used  . Alcohol Use: No    OB History    Grav Para Term Preterm Abortions TAB SAB Ect Mult Living                  Review of Systems  Constitutional: Positive for  activity change.  HENT: Negative for neck pain.   Respiratory: Negative for shortness of breath.   Cardiovascular: Negative for chest pain.  Gastrointestinal: Positive for nausea, vomiting, abdominal pain and diarrhea.  Genitourinary: Negative for dysuria.  Neurological: Negative for dizziness and headaches.    Allergies  Sulfonamide derivatives  Home Medications   Current Outpatient Rx  Name Route Sig Dispense Refill  . VITAMIN C PO Oral Take 1 tablet by mouth daily.      . ASPIRIN EC 81 MG PO TBEC Oral Take 81 mg by mouth daily.      Marland Kitchen CITALOPRAM HYDROBROMIDE 20 MG PO TABS Oral Take 20 mg by mouth daily.      . CYCLOBENZAPRINE HCL 10 MG PO TABS Oral Take 10 mg by mouth at bedtime as needed. For spasms     . FERROUS SULFATE 325 (65 FE) MG PO TABS Oral Take 325 mg by mouth daily.      . FESOTERODINE FUMARATE ER 4 MG PO TB24 Oral Take 4 mg by mouth daily.    . FUROSEMIDE 40 MG PO TABS Oral Take 40 mg by mouth daily as needed. For swelling    . GABAPENTIN 300 MG PO CAPS Oral Take 1 capsule (300 mg total) by mouth 5 (five) times daily. 150 capsule 1  . IBUPROFEN  800 MG PO TABS Oral Take 800 mg by mouth every 8 (eight) hours as needed. For pain/inflammation.    Marland Kitchen LISINOPRIL-HYDROCHLOROTHIAZIDE 10-12.5 MG PO TABS Oral Take 1 tablet by mouth every other day.     Marland Kitchen METOPROLOL TARTRATE 25 MG PO TABS Oral Take 12.5 mg by mouth 2 (two) times daily.      Carma Leaven M PLUS PO TABS Oral Take 1 tablet by mouth daily.      Marland Kitchen NITROGLYCERIN 0.4 MG SL SUBL Sublingual Place 0.4 mg under the tongue every 5 (five) minutes as needed. For chest pain x 3 doses.     . OMEPRAZOLE 20 MG PO CPDR Oral Take 20 mg by mouth daily.      Marland Kitchen ONDANSETRON HCL 4 MG PO TABS Oral Take 4 mg by mouth every 6 (six) hours as needed. For nausea    . POTASSIUM CHLORIDE CRYS ER 20 MEQ PO TBCR Oral Take 20 mEq by mouth 2 (two) times daily.      . TRAMADOL HCL 50 MG PO TABS Oral Take 1 tablet (50 mg total) by mouth every 6 (six) hours  as needed. For pain 120 tablet 1    BP 117/59  Pulse 84  Temp 98.3 F (36.8 C) (Oral)  Resp 20  SpO2 97%  Physical Exam  Nursing note and vitals reviewed. Constitutional: She is oriented to person, place, and time. She appears well-developed and well-nourished.  HENT:  Head: Normocephalic and atraumatic.  Eyes: EOM are normal. Pupils are equal, round, and reactive to light.  Neck: Neck supple. No JVD present.  Cardiovascular: Normal rate, regular rhythm and normal heart sounds.   No murmur heard. Pulmonary/Chest: Effort normal. No respiratory distress.  Abdominal: Soft. She exhibits no distension. There is tenderness. There is no rebound and no guarding.       Periumbilical tenderness, no rebound or guarding  Neurological: She is alert and oriented to person, place, and time.  Skin: Skin is warm and dry.    ED Course  Procedures (including critical care time)  Labs Reviewed  CBC WITH DIFFERENTIAL - Abnormal; Notable for the following:    RBC 3.85 (*)     Hemoglobin 11.1 (*)     HCT 33.5 (*)     RDW 16.3 (*)     All other components within normal limits  BASIC METABOLIC PANEL - Abnormal; Notable for the following:    Glucose, Bld 116 (*)     BUN 28 (*)     GFR calc non Af Amer 74 (*)     GFR calc Af Amer 86 (*)     All other components within normal limits  HEPATIC FUNCTION PANEL - Abnormal; Notable for the following:    Albumin 3.4 (*)     Alkaline Phosphatase 239 (*)     Total Bilirubin 0.2 (*)     All other components within normal limits  URINALYSIS, ROUTINE W REFLEX MICROSCOPIC - Abnormal; Notable for the following:    APPearance HAZY (*)     All other components within normal limits  LIPASE, BLOOD  MAGNESIUM  PHOSPHORUS   Dg Abd Acute W/chest  02/10/2012  *RADIOLOGY REPORT*  Clinical Data: Generalized abdominal pain.  ACUTE ABDOMEN SERIES (ABDOMEN 2 VIEW & CHEST 1 VIEW)  Comparison: 01/13/2012  Findings: Scoliosis in the thoracic spine.  Linear densities  in the left lower lung are suggestive for atelectasis or scarring. Otherwise, lungs are clear.  Normal appearance of the heart.  No evidence for free air.  Extensive surgical changes in the abdomen. There is also an IVC filter present.  Stool throughout the abdomen. Nonobstructive bowel gas pattern.  IMPRESSION: Left basilar lung atelectasis or scarring.  Large amount of stool in the abdomen.  Nonobstructive bowel gas pattern.  Thoracic scoliosis.   Original Report Authenticated By: Richarda Overlie, M.D.      No diagnosis found.    MDM  DDx includes: Pancreatitis Hepatobiliary pathology including cholecystitis Gastritis/PUD SBO ACS syndrome Aortic Dissection Gastroparesis  Pt comes in with cc of abd pain. She is immunocompetent and has 0 SIRS criteria at arrival  - and this appears to be an ongoing issue and not an acute problem. Initial impression is that this is related to gastroparesis, or is gastroenteritis. The abd exam is non peritoneal, and unless the [facility] is severely elevated, no indication for CT.  Will get basic GI labs, AAS to make sure there is no obstruction. Will attempt PO challenge - however, if she fails, we will admit for intractable nausea and emesis, likely from gastroparesis.  12:42 AM All results are normal. Does have BUN elevation, suggesting may be a little dehydration. UA pending. PO challenge started.          Derwood Kaplan, MD 02/11/12 [phone]

## 2012-02-19 ENCOUNTER — Emergency Department (HOSPITAL_COMMUNITY)
Admission: EM | Admit: 2012-02-19 | Discharge: 2012-02-20 | Disposition: A | Discharge status: Home / Self Care | Payer: Medicaid Other | Attending: Emergency Medicine | Admitting: Emergency Medicine

## 2012-02-19 ENCOUNTER — Encounter (HOSPITAL_COMMUNITY): Payer: Self-pay | Admitting: Family Medicine

## 2012-02-19 DIAGNOSIS — Z7982 Long term (current) use of aspirin: Secondary | ICD-10-CM | POA: Insufficient documentation

## 2012-02-19 DIAGNOSIS — F411 Generalized anxiety disorder: Secondary | ICD-10-CM | POA: Insufficient documentation

## 2012-02-19 DIAGNOSIS — IMO0001 Reserved for inherently not codable concepts without codable children: Secondary | ICD-10-CM | POA: Insufficient documentation

## 2012-02-19 DIAGNOSIS — I10 Essential (primary) hypertension: Secondary | ICD-10-CM | POA: Insufficient documentation

## 2012-02-19 DIAGNOSIS — Z87891 Personal history of nicotine dependence: Secondary | ICD-10-CM | POA: Insufficient documentation

## 2012-02-19 DIAGNOSIS — F329 Major depressive disorder, single episode, unspecified: Secondary | ICD-10-CM | POA: Insufficient documentation

## 2012-02-19 DIAGNOSIS — R197 Diarrhea, unspecified: Secondary | ICD-10-CM | POA: Insufficient documentation

## 2012-02-19 DIAGNOSIS — Z79899 Other long term (current) drug therapy: Secondary | ICD-10-CM | POA: Insufficient documentation

## 2012-02-19 DIAGNOSIS — R6883 Chills (without fever): Secondary | ICD-10-CM | POA: Insufficient documentation

## 2012-02-19 DIAGNOSIS — R112 Nausea with vomiting, unspecified: Secondary | ICD-10-CM

## 2012-02-19 DIAGNOSIS — F3289 Other specified depressive episodes: Secondary | ICD-10-CM | POA: Insufficient documentation

## 2012-02-19 LAB — COMPREHENSIVE METABOLIC PANEL
ALT: 17 U/L (ref 0–35)
AST: 18 U/L (ref 0–37)
Albumin: 2.9 g/dL — ABNORMAL LOW (ref 3.5–5.2)
CO2: 24 mEq/L (ref 19–32)
Calcium: 8.5 mg/dL (ref 8.4–10.5)
Chloride: 103 mEq/L (ref 96–112)
GFR calc non Af Amer: >90 mL/min (ref >90)
Sodium: 137 mEq/L (ref 135–145)

## 2012-02-19 LAB — URINALYSIS, ROUTINE W REFLEX MICROSCOPIC
Glucose, UA: NEGATIVE mg/dL
Hgb urine dipstick: NEGATIVE
Protein, ur: NEGATIVE mg/dL
pH: 7 (ref 5.0–8.0)

## 2012-02-19 LAB — CBC WITH DIFFERENTIAL/PLATELET
Basophils Absolute: 0 10*3/uL (ref 0.0–0.1)
Basophils Relative: 0 % (ref 0–1)
Lymphocytes Relative: 20 % (ref 12–46)
Neutro Abs: 6.2 10*3/uL (ref 1.7–7.7)
Platelets: 357 10*3/uL (ref 150–400)
RDW: 14.6 % (ref 11.5–15.5)
WBC: 8.4 10*3/uL (ref 4.0–10.5)

## 2012-02-19 LAB — URINE MICROSCOPIC-ADD ON

## 2012-02-19 LAB — POCT PREGNANCY, URINE: Preg Test, Ur: NEGATIVE

## 2012-02-19 LAB — TROPONIN I: Troponin I: <0.3 ng/mL (ref <0.30)

## 2012-02-19 MED ORDER — ONDANSETRON HCL 4 MG/2ML IJ SOLN
4.0000 mg | Freq: Once | INTRAMUSCULAR | Status: AC
  Administered 2012-02-19: 4 mg via INTRAVENOUS

## 2012-02-19 MED ORDER — MORPHINE SULFATE 4 MG/ML IJ SOLN
4.0000 mg | Freq: Once | INTRAMUSCULAR | Status: AC
  Administered 2012-02-19: 4 mg via INTRAVENOUS
  Filled 2012-02-19: qty 1

## 2012-02-19 MED ORDER — SODIUM CHLORIDE 0.9 % IV SOLN
1000.0000 mL | Freq: Once | INTRAVENOUS | Status: AC
  Administered 2012-02-19: 1000 mL via INTRAVENOUS

## 2012-02-19 MED ORDER — ONDANSETRON HCL 4 MG/2ML IJ SOLN
INTRAMUSCULAR | Status: AC
  Administered 2012-02-19: 4 mg via INTRAVENOUS
  Filled 2012-02-19: qty 2

## 2012-02-19 MED ORDER — METOCLOPRAMIDE HCL 5 MG/ML IJ SOLN
10.0000 mg | Freq: Once | INTRAMUSCULAR | Status: AC
  Administered 2012-02-19: 10 mg via INTRAVENOUS
  Filled 2012-02-19: qty 2

## 2012-02-19 MED ORDER — SODIUM CHLORIDE 0.9 % IV BOLUS (SEPSIS)
1000.0000 mL | Freq: Once | INTRAVENOUS | Status: AC
  Administered 2012-02-19: 1000 mL via INTRAVENOUS

## 2012-02-19 MED ORDER — ONDANSETRON HCL 4 MG/2ML IJ SOLN
4.0000 mg | Freq: Once | INTRAMUSCULAR | Status: AC
  Administered 2012-02-19: 4 mg via INTRAVENOUS
  Filled 2012-02-19: qty 2

## 2012-02-19 NOTE — ED Provider Notes (Signed)
History     CSN: [id number]  Arrival date & time 02/19/12  4098   First MD Initiated Contact with Patient 02/19/12 2102      Chief Complaint  Patient presents with  . Emesis     Patient is a 56 y.o. female presenting with vomiting. The history is provided by the patient.  Emesis  This is a recurrent problem. The current episode started yesterday. The problem occurs 2 to 4 times per day. The problem has been gradually worsening. The emesis has an appearance of stomach contents. There has been no fever. Associated symptoms include chills and diarrhea. Pertinent negatives include no fever. Risk factors: h/o similar episodes in the past.  pt reports onset of vomiting/diarrhea and abdominal pain for past day No blood in vomitus/stool She has had this previously, and thought to have gastroparesis - her GI physician is Dr Dulce Sellar No cp at this time.  No SOB.  No focal weakness.  No fever  Past Medical History  Diagnosis Date  . Fibromyalgia   . Hypertension   . Depression   . Anxiety     Past Surgical History  Procedure Date  . Gastric by-pass   . Hernia repair     Family History  Problem Relation Age of Onset  . Diabetes Mother   . Heart disease Father   . Hypertension Father     History  Substance Use Topics  . Smoking status: Former Smoker    Quit date: 04/19/2009  . Smokeless tobacco: Never Used  . Alcohol Use: No    OB History    Grav Para Term Preterm Abortions TAB SAB Ect Mult Living                  Review of Systems  Constitutional: Positive for chills. Negative for fever.  Gastrointestinal: Positive for vomiting and diarrhea.  All other systems reviewed and are negative.    Allergies  Sulfonamide derivatives  Home Medications   Current Outpatient Rx  Name Route Sig Dispense Refill  . VITAMIN C PO Oral Take 1 tablet by mouth daily.      . ASPIRIN EC 81 MG PO TBEC Oral Take 81 mg by mouth daily.      Marland Kitchen CITALOPRAM HYDROBROMIDE 20 MG PO TABS Oral  Take 20 mg by mouth daily.      . CYCLOBENZAPRINE HCL 10 MG PO TABS Oral Take 10 mg by mouth at bedtime as needed. For spasms     . FERROUS SULFATE 325 (65 FE) MG PO TABS Oral Take 325 mg by mouth daily.      . FESOTERODINE FUMARATE ER 4 MG PO TB24 Oral Take 4 mg by mouth daily.    . FUROSEMIDE 40 MG PO TABS Oral Take 40 mg by mouth daily as needed. For swelling    . GABAPENTIN 300 MG PO CAPS Oral Take 1 capsule (300 mg total) by mouth 5 (five) times daily. 150 capsule 1  . IBUPROFEN 800 MG PO TABS Oral Take 800 mg by mouth every 8 (eight) hours as needed. For pain/inflammation.    Marland Kitchen LISINOPRIL-HYDROCHLOROTHIAZIDE 10-12.5 MG PO TABS Oral Take 1 tablet by mouth every other day.     Marland Kitchen METOPROLOL TARTRATE 25 MG PO TABS Oral Take 12.5 mg by mouth 2 (two) times daily.      Carma Leaven M PLUS PO TABS Oral Take 1 tablet by mouth daily.      Marland Kitchen NITROGLYCERIN 0.4 MG SL SUBL Sublingual Place  0.4 mg under the tongue every 5 (five) minutes as needed. For chest pain x 3 doses.     . OMEPRAZOLE 20 MG PO CPDR Oral Take 20 mg by mouth daily.      Marland Kitchen ONDANSETRON HCL 4 MG PO TABS Oral Take 4 mg by mouth every 6 (six) hours as needed. For nausea    . POTASSIUM CHLORIDE CRYS ER 20 MEQ PO TBCR Oral Take 20 mEq by mouth 2 (two) times daily.      . TRAMADOL HCL 50 MG PO TABS Oral Take 1 tablet (50 mg total) by mouth every 6 (six) hours as needed. For pain 120 tablet 1    BP 152/73  Pulse 60  Temp 98.8 F (37.1 C) (Oral)  Resp 20  SpO2 100%  Physical Exam CONSTITUTIONAL: Well developed/well nourished HEAD AND FACE: Normocephalic/atraumatic EYES: EOMI/PERRL, no icterus ENMT: Mucous membranes dry NECK: supple no meningeal signs SPINE:entire spine nontender CV: S1/S2 noted, no murmurs/rubs/gallops noted LUNGS: Lungs are clear to auscultation bilaterally, no apparent distress ABDOMEN: soft, nontender, no rebound or guarding GU:no cva tenderness NEURO: Pt is awake/alert, moves all extremitiesx4 EXTREMITIES: pulses  normal, full ROM SKIN: warm, color normal PSYCH: no abnormalities of mood noted  ED Course  Procedures   Labs Reviewed  CBC WITH DIFFERENTIAL - Abnormal; Notable for the following:    RBC 3.70 (*)     Hemoglobin 10.5 (*)     HCT 31.5 (*)     All other components within normal limits  COMPREHENSIVE METABOLIC PANEL - Abnormal; Notable for the following:    Potassium 3.3 (*)     Albumin 2.9 (*)     Alkaline Phosphatase 187 (*)     All other components within normal limits  LIPASE, BLOOD - Abnormal; Notable for the following:    Lipase 10 (*)     All other components within normal limits  URINALYSIS, ROUTINE W REFLEX MICROSCOPIC  TROPONIN I   Pt with active vomiting in the room.  IV zofran ordered, will reassess  10:59 PM PT FEELING IMPROVED, AMBULATORY 11:45 PM Labs reassuring Doubt ACS, will not repeat troponin EKG unchanged Abdomen soft Doubt acute abd process at this time  MDM  Nursing notes including past medical history and social history reviewed and considered in documentation Labs/vital reviewed and considered Previous records reviewed and considered - h/o vomiting in the past, reported h/o gastroparesis, but last gastric emptying study is negative   Date: 02/19/2012  Rate: 74  Rhythm: normal sinus rhythm  QRS Axis: normal  Intervals: normal  ST/T Wave abnormalities: nonspecific ST changes  Conduction Disutrbances:none  Narrative Interpretation:   Old EKG Reviewed: unchanged         Joya Gaskins, MD 02/19/12 2346

## 2012-02-19 NOTE — ED Notes (Signed)
Advised patient we need a urine sample. 

## 2012-02-19 NOTE — ED Notes (Signed)
Reminded patient we still need a urine sample.

## 2012-02-19 NOTE — ED Notes (Signed)
sts vomiting x 1 day. sts that she has been going through this for about 1 month. sts abdominal pain in the middle of her stomach. sts Hx of bowel obstruction.

## 2012-02-20 NOTE — ED Notes (Signed)
The patient is AOx4 and comfortable with her discharge instructions. 

## 2012-02-24 ENCOUNTER — Other Ambulatory Visit: Payer: Self-pay | Admitting: Gastroenterology

## 2012-02-24 DIAGNOSIS — R110 Nausea: Secondary | ICD-10-CM

## 2012-02-25 ENCOUNTER — Ambulatory Visit
Admission: RE | Admit: 2012-02-25 | Discharge: 2012-02-25 | Disposition: A | Discharge status: Home / Self Care | Payer: Medicaid Other | Source: Ambulatory Visit | Attending: Gastroenterology | Admitting: Gastroenterology

## 2012-02-25 DIAGNOSIS — R110 Nausea: Secondary | ICD-10-CM

## 2012-02-29 ENCOUNTER — Telehealth: Payer: Self-pay | Admitting: Physical Medicine and Rehabilitation

## 2012-02-29 ENCOUNTER — Encounter (HOSPITAL_COMMUNITY): Payer: Self-pay | Admitting: *Deleted

## 2012-02-29 MED ORDER — IBUPROFEN 800 MG PO TABS: 800.0000 mg | ORAL_TABLET | Freq: Three times a day (TID) | ORAL | Status: DC | PRN

## 2012-02-29 NOTE — Telephone Encounter (Signed)
Medication refill sent to pharmacy, [facility] notified.

## 2012-02-29 NOTE — Telephone Encounter (Signed)
Refill on Motrin.  She is out.

## 2012-03-01 ENCOUNTER — Ambulatory Visit (HOSPITAL_COMMUNITY)
Admission: RE | Admit: 2012-03-01 | Discharge: 2012-03-01 | Disposition: A | Discharge status: Home / Self Care | Payer: Medicaid Other | Source: Ambulatory Visit | Attending: Gastroenterology | Admitting: Gastroenterology

## 2012-03-01 ENCOUNTER — Ambulatory Visit (HOSPITAL_COMMUNITY): Payer: Medicaid Other | Admitting: Anesthesiology

## 2012-03-01 ENCOUNTER — Encounter (HOSPITAL_COMMUNITY)
Admission: RE | Disposition: A | Discharge status: Home / Self Care | Payer: Self-pay | Source: Ambulatory Visit | Attending: Gastroenterology

## 2012-03-01 ENCOUNTER — Encounter (HOSPITAL_COMMUNITY): Payer: Self-pay | Admitting: Gastroenterology

## 2012-03-01 ENCOUNTER — Encounter: Payer: Medicaid Other | Admitting: Physical Medicine and Rehabilitation

## 2012-03-01 ENCOUNTER — Encounter (HOSPITAL_COMMUNITY): Payer: Self-pay | Admitting: Anesthesiology

## 2012-03-01 DIAGNOSIS — I10 Essential (primary) hypertension: Secondary | ICD-10-CM | POA: Insufficient documentation

## 2012-03-01 DIAGNOSIS — IMO0001 Reserved for inherently not codable concepts without codable children: Secondary | ICD-10-CM | POA: Insufficient documentation

## 2012-03-01 DIAGNOSIS — G4730 Sleep apnea, unspecified: Secondary | ICD-10-CM | POA: Insufficient documentation

## 2012-03-01 DIAGNOSIS — J45909 Unspecified asthma, uncomplicated: Secondary | ICD-10-CM | POA: Insufficient documentation

## 2012-03-01 DIAGNOSIS — K219 Gastro-esophageal reflux disease without esophagitis: Secondary | ICD-10-CM | POA: Insufficient documentation

## 2012-03-01 DIAGNOSIS — K315 Obstruction of duodenum: Secondary | ICD-10-CM | POA: Insufficient documentation

## 2012-03-01 DIAGNOSIS — K269 Duodenal ulcer, unspecified as acute or chronic, without hemorrhage or perforation: Secondary | ICD-10-CM | POA: Insufficient documentation

## 2012-03-01 DIAGNOSIS — R112 Nausea with vomiting, unspecified: Secondary | ICD-10-CM | POA: Insufficient documentation

## 2012-03-01 HISTORY — PX: ESOPHAGOGASTRODUODENOSCOPY (EGD) WITH PROPOFOL: SHX5813

## 2012-03-01 HISTORY — DX: Anemia, unspecified: D64.9

## 2012-03-01 HISTORY — DX: Gastro-esophageal reflux disease without esophagitis: K21.9

## 2012-03-01 SURGERY — ESOPHAGOGASTRODUODENOSCOPY (EGD) WITH PROPOFOL
Anesthesia: Monitor Anesthesia Care

## 2012-03-01 MED ORDER — ONDANSETRON HCL 4 MG/2ML IJ SOLN
4.0000 mg | Freq: Once | INTRAMUSCULAR | Status: AC
  Administered 2012-03-01: 4 mg via INTRAVENOUS

## 2012-03-01 MED ORDER — LACTATED RINGERS IV SOLN
INTRAVENOUS | Status: DC | PRN
  Administered 2012-03-01: 1000 mL
  Administered 2012-03-01: 11:00:00 via INTRAVENOUS

## 2012-03-01 MED ORDER — BUTAMBEN-TETRACAINE-BENZOCAINE 2-2-14 % EX AERO
INHALATION_SPRAY | CUTANEOUS | Status: DC | PRN
  Administered 2012-03-01: 2 via TOPICAL

## 2012-03-01 MED ORDER — PANTOPRAZOLE SODIUM 40 MG PO TBEC: 40.0000 mg | DELAYED_RELEASE_TABLET | Freq: Two times a day (BID) | ORAL | Status: DC

## 2012-03-01 MED ORDER — LIDOCAINE HCL 1 % IJ SOLN
INTRAMUSCULAR | Status: AC
  Filled 2012-03-01: qty 20

## 2012-03-01 MED ORDER — ONDANSETRON HCL 4 MG/2ML IJ SOLN
INTRAMUSCULAR | Status: AC
  Filled 2012-03-01: qty 2

## 2012-03-01 MED ORDER — SODIUM CHLORIDE 0.9 % IV SOLN: INTRAVENOUS | Status: DC

## 2012-03-01 MED ORDER — SUCRALFATE 1 GM/10ML PO SUSP: 1.0000 g | Freq: Three times a day (TID) | ORAL | Status: DC

## 2012-03-01 MED ORDER — MIDAZOLAM HCL 5 MG/5ML IJ SOLN
INTRAMUSCULAR | Status: DC | PRN
  Administered 2012-03-01: 2 mg via INTRAVENOUS

## 2012-03-01 MED ORDER — PROPOFOL 10 MG/ML IV BOLUS
INTRAVENOUS | Status: DC | PRN
  Administered 2012-03-01: 30 mg via INTRAVENOUS
  Administered 2012-03-01: 20 mg via INTRAVENOUS
  Administered 2012-03-01: 50 mg via INTRAVENOUS

## 2012-03-01 MED ORDER — FENTANYL CITRATE 0.05 MG/ML IJ SOLN
INTRAMUSCULAR | Status: DC | PRN
  Administered 2012-03-01: 50 ug via INTRAVENOUS

## 2012-03-01 SURGICAL SUPPLY — 15 items

## 2012-03-01 NOTE — Anesthesia Postprocedure Evaluation (Signed)
  Anesthesia Post-op Note  Patient: [name]  Procedure(s) Performed: Procedure(s) (LRB): ESOPHAGOGASTRODUODENOSCOPY (EGD) WITH PROPOFOL (N/A)  Patient Location: PACU  Anesthesia Type: MAC  Level of Consciousness: awake and alert   Airway and Oxygen Therapy: Patient Spontanous Breathing  Post-op Pain: mild  Post-op Assessment: Post-op Vital signs reviewed, Patient's Cardiovascular Status Stable, Respiratory Function Stable, Patent Airway and No signs of Nausea or vomiting  Post-op Vital Signs: stable  Complications: No apparent anesthesia complications

## 2012-03-01 NOTE — Op Note (Signed)
Pt  C/o of nausea/vomiting in recovery after endoscopy procedure. Dr Dulce Sellar notified, dr stated this is from her ulcer.  Order given for Zofran IV, administered to pt. Pt states nausea has decreased; is not currently vomiting/dry heaving.  Will continue to monitor.  Angelique Blonder, RN

## 2012-03-01 NOTE — Op Note (Signed)
[facility] [address] [place], [address]   ENDOSCOPY PROCEDURE REPORT  PATIENT: [name], [name]  MR#: [id number] BIRTHDATE: [birth year] , 56  yrs. old GENDER: Female ENDOSCOPIST: Willis Modena, MD REFERRED BY:  Orpah Cobb, M.D. PROCEDURE DATE:  03/01/2012 PROCEDURE:  EGD w/ biopsy ASA CLASS:     Class III INDICATIONS:  nausea, vomiting, abnormal upper GI series. MEDICATIONS: MAC sedation, administered by CRNA TOPICAL ANESTHETIC: Cetacaine Spray  DESCRIPTION OF PROCEDURE: After the risks benefits and alternatives of the procedure were thoroughly explained, informed consent was obtained.  The Pentax Gastroscope [id number] endoscope was introduced through the mouth and advanced to the proximal jejunum. Without limitations.  The instrument was slowly withdrawn as the mucosa was fully examined.    Findings:  Normal esophagus.  Some narrowing in mid-body of stomach and some scarring and fibrosis in mid stomach, possibly reflective of prior gastric surgery. Some residual barium, small amount, coating walls of parts of the stomach.  Widely patent pylorus without evidence of gastric outlet obstruction.  In post-bulbar duodenum, there is a very large (over 3cm) and very deep clean-based ulcer causing secondary luminal stenosis.  Ulcer was biopsied extensively (center and edge) with cold forceps.  There is some food debris and barium fragments in vicinity of ulcer, preventing complete and full evaluation of this area.  Unclear if stenosis is at region of duodenojejunal anastomosis or whether it is solely a function of the ulcer.  Stenosis was short (1-2cm) and about 10mm in diameter, allowing passage of diagnostic endoscope with mild resistance.  Lumen distal to stricture appeared most compatible with jejunum and appeared normal.         Retroflexion into cardia was normal.          The scope was then withdrawn from the patient and the procedure  completed.  ENDOSCOPIC IMPRESSION:     Large post-bulbar ulcer, seemingly in regional of duodeno-jejunal anastomosis, with secondary post-ulcer stenosis/stricturing.  All of these findings are new compared with prior EGD about one year ago.  Some retained food and barium fragements seen upstream of the stricture, indicative that this region is highly likely major contributor to her nausea/vomiting symptoms.  RECOMMENDATIONS:     1.  Watch for potential complications of procedure. 2.  Await biopsy results. 3.  No ASA/NSAIDs. 4.  Carafate suspension 1 g q.i.d. 5.  Protonix 40 mg twice-a-day. 6.  Liquid/pureed  diet only; no solid foods, no fibrous meats/breads/vegetables. 7.  If symptoms persist despite medical therapy, might need surgical consultation for consideration of resection or gastrojejunostomy. 8.  Follow-up with [facility] in 1-2 weeks.  eSigned:  Willis Modena, MD 03/01/2012 12:03 PM   CC:

## 2012-03-01 NOTE — Transfer of Care (Signed)
Immediate Anesthesia Transfer of Care Note  Patient: [name]  Procedure(s) Performed: Procedure(s) (LRB) with comments: ESOPHAGOGASTRODUODENOSCOPY (EGD) WITH PROPOFOL (N/A)  Patient Location: PACU  Anesthesia Type:MAC  Level of Consciousness: awake, alert  and patient cooperative  Airway & Oxygen Therapy: Patient Spontanous Breathing and Patient connected to nasal cannula oxygen  Post-op Assessment: Report given to PACU RN and Post -op Vital signs reviewed and stable  Post vital signs: Reviewed and stable  Complications: No apparent anesthesia complications

## 2012-03-01 NOTE — H&P (Signed)
Patient interval history reviewed.  Patient examined again.  There has been no change from documented H/P dated 04/25/11 (scanned into chart from our office) except as documented above.  Assessment:  1.  Nausea, vomiting. 2.  Abnormal upper GI series (narrowing at duodeno-jejunal anastomosis).  Plan:  1.  Upper endoscopy. 2.  Risks (bleeding, infection, bowel perforation that could require surgery, sedation-related changes in cardiopulmonary systems), benefits (identification and possible treatment of source of symptoms, exclusion of certain causes of symptoms), and alternatives (watchful waiting, radiographic imaging studies, empiric medical treatment) of upper endoscopy (EGD) were explained to patient in detail and she wishes to proceed.

## 2012-03-01 NOTE — Anesthesia Preprocedure Evaluation (Addendum)
Anesthesia Evaluation  Patient identified by MRN, date of birth, ID band Patient awake    Reviewed: Allergy & Precautions, H&P , NPO status , Patient's Chart, lab work & pertinent test results  Airway Mallampati: II TM Distance: >3 FB Neck ROM: Full    Dental No notable dental hx.    Pulmonary shortness of breath, asthma , sleep apnea ,  Denies OSA since weight loss.(S/P gastric bypass.) breath sounds clear to auscultation  Pulmonary exam normal       Cardiovascular hypertension, Pt. on medications and Pt. on home beta blockers Rhythm:Regular Rate:Normal     Neuro/Psych PSYCHIATRIC DISORDERS Anxiety Depression  Neuromuscular disease    GI/Hepatic Neg liver ROS, GERD-  Medicated,Nausea   Endo/Other  neg diabetesNo diabetes since weight loss  Renal/GU negative Renal ROS  negative genitourinary   Musculoskeletal  (+) Fibromyalgia -  Abdominal   Peds negative pediatric ROS (+)  Hematology negative hematology ROS (+)   Anesthesia Other Findings   Reproductive/Obstetrics negative OB ROS                          Anesthesia Physical Anesthesia Plan  ASA: III  Anesthesia Plan: MAC   Post-op Pain Management:    Induction: Intravenous  Airway Management Planned:   Additional Equipment:   Intra-op Plan:   Post-operative Plan:   Informed Consent: I have reviewed the patients History and Physical, chart, labs and discussed the procedure including the risks, benefits and alternatives for the proposed anesthesia with the patient or authorized representative who has indicated his/her understanding and acceptance.   Dental advisory given  Plan Discussed with: CRNA  Anesthesia Plan Comments:         Anesthesia Quick Evaluation

## 2012-03-02 ENCOUNTER — Encounter (HOSPITAL_COMMUNITY): Payer: Self-pay | Admitting: Gastroenterology

## 2012-03-20 ENCOUNTER — Encounter: Payer: Medicaid Other | Attending: Neurosurgery | Admitting: Physical Medicine and Rehabilitation

## 2012-03-20 ENCOUNTER — Encounter: Payer: Self-pay | Admitting: Physical Medicine and Rehabilitation

## 2012-03-20 VITALS — BP 145/80 | HR 68 | Resp 14 | Ht 67.0 in | Wt 199.0 lb

## 2012-03-20 DIAGNOSIS — M79604 Pain in right leg: Secondary | ICD-10-CM

## 2012-03-20 DIAGNOSIS — M79609 Pain in unspecified limb: Secondary | ICD-10-CM

## 2012-03-20 DIAGNOSIS — Z5181 Encounter for therapeutic drug level monitoring: Secondary | ICD-10-CM | POA: Insufficient documentation

## 2012-03-20 DIAGNOSIS — M79605 Pain in left leg: Secondary | ICD-10-CM

## 2012-03-20 DIAGNOSIS — R52 Pain, unspecified: Secondary | ICD-10-CM | POA: Insufficient documentation

## 2012-03-20 DIAGNOSIS — M549 Dorsalgia, unspecified: Secondary | ICD-10-CM

## 2012-03-20 MED ORDER — HYDROCODONE-ACETAMINOPHEN 5-325 MG PO TABS: 1.0000 | ORAL_TABLET | Freq: Four times a day (QID) | ORAL | Status: DC | PRN

## 2012-03-20 NOTE — Patient Instructions (Addendum)
I have ordered a lumbar MRI.  I would like you to discontinue your tramadol  I am starting you on hydrocodone  I would like to see you back on December 20  I understand you are considering epidural steroid injection.

## 2012-03-20 NOTE — Progress Notes (Signed)
Subjective:    Patient ID: [name], female    DOB: 12/25/1955, 56 y.o.   MRN: [id number]  HPI  Patient is a 56 year old woman who has multiple pain complaints. [name] with chronic low back and neck pain, moderate to severe thoracic scoliosis, cervical degenerative disc disease in 2012 seen by Dr. Venetia Maxon, bilateral leg pain consistent with spinal stenosis.  Patient is back in today and reports episode of gastroenteritis. She was seen in the emergency room. For the last several weeks she has had little energy and has a followup appointment with primary care next week.  She is here today for refill of tramadol and ibuprofen.  A couple weeks ago she stopped going to her pool program due to decreased energy levels.     Pain Inventory Average Pain 5 Pain Right Now 8 My pain is dull and aching  In the last 24 hours, has pain interfered with the following? General activity 10 Relation with others 10 Enjoyment of life 10 What TIME of day is your pain at its worst? morning and night Sleep (in general) Poor  Pain is worse with: walking, bending and standing Pain improves with: rest and medication Relief from Meds: 5  Mobility use a cane use a walker how many minutes can you walk? 5 ability to climb steps?  no do you drive?  yes transfers alone Do you have any goals in this area?  yes  Function not employed: date last employed  I need assistance with the following:  dressing, bathing, meal prep and household duties Do you have any goals in this area?  yes  Neuro/Psych bladder control problems bowel control problems weakness trouble walking depression loss of taste or smell  Prior Studies Any changes since last visit?  no  Physicians involved in your care Any changes since last visit?  no   Family History  Problem Relation Age of Onset  . Diabetes Mother   . Heart disease Father   . Hypertension Father    History   Social  History  . Marital Status: Widowed    Spouse Name: N/A    Number of Children: N/A  . Years of Education: N/A   Social History Main Topics  . Smoking status: Former Smoker    Quit date: 04/19/2009  . Smokeless tobacco: Never Used  . Alcohol Use: No  . Drug Use: No  . Sexually Active: None   Other Topics Concern  . None   Social History Narrative  . None   Past Surgical History  Procedure Date  . Gastric by-pass   . Hernia repair   . Cholecystectomy 2007  . Tonsillectomy     as child  . Dilation and curettage of uterus   . Esophagogastroduodenoscopy (egd) with propofol 03/01/2012    Procedure: ESOPHAGOGASTRODUODENOSCOPY (EGD) WITH PROPOFOL;  Surgeon: Willis Modena, MD;  Location: [facility];  Service: Endoscopy;  Laterality: N/A;   Past Medical History  Diagnosis Date  . Fibromyalgia   . Hypertension   . Depression   . Anxiety   . GERD (gastroesophageal reflux disease)   . Anemia    BP 145/80  Pulse 68  Resp 14  Ht 5\' 7"  (1.702 m)  Wt 199 lb (90.266 kg)  BMI 31.17 kg/m2  SpO2 98%     Review of Systems  Constitutional: Positive for appetite change and unexpected weight change.  Cardiovascular: Positive for leg swelling.  Gastrointestinal: Positive for nausea, vomiting, abdominal pain and diarrhea.  Musculoskeletal: Positive for back pain and gait problem.  Neurological: Positive for weakness.  Psychiatric/Behavioral: Positive for dysphoric mood.  All other systems reviewed and are negative.       Objective:   Physical Exam She is an obese woman who appears her stated age and does not appear in any distress. She is oriented x3 speech is clear affect is bright she's alert cooperative and pleasant follows commands with difficulty and his questions appropriately  Cranial nerves are intact. Coordination is good in the upper extremities.  Motor strength is generally good in the upper extremities however she has weak hip flexors bilaterally 3 minus over 5.    Reflexes are with 2+ in the upper extremities without abnormal tone. Hoffmann sign is negative  Reflexes are diminished in both lower extremities without abnormal tone.  Hip extensors are somewhat weak too and she has some difficulty and getting a chair without use of her upper extremities.  Gait is unsteady. She uses a walker.  Tandem gait and Rhombergs tests are not assessed due to safety concerns.  She reports intact sensation in the upper and lower extremities to light touch but has some decreased vibratory sensation in both lower extremities, especially lower legs and feet medially.  Extension of lumbar spine is limited and she reports discomfort in the low back which radiates through both lower extremities posteriorly to the knees.  Cervical range of motion is mildly limited and she reports pain in all planes  Shoulder range of motion is mildly limited bilaterally  Slow gait  Internal and external rotation at the right hip aggravates posterior hip pain. She has some trouble with hip flexion on the right side as well compared to the left side.  Scoliosis noted, no spinal tenderness appreciated cervical thoracic or lumbar. Left shoulder higher than right shoulder.      02/23/10 Lumbar MR  1. No change in moderate central canal stenosis at L4-5 where  there is also left foraminal narrowing.  2. No change in moderate central canal stenosis and narrowing of  both lateral recesses at L5-S1.  3. New prominent Schmorl's node in the superior plate of L3.  consider ESI if lower extremity pain problematic  Thoracic radiograph 06/16/11 Moderate to severe thoracolumbar scoliosis is again noted  with associated degenerative change. No acute compression  deformity is seen.  Moderate central stenosis noted on lumbar MRI November 2011 unchanged from previous MRI 2009  Clinical Data: Mid upper back pain, no injury  THORACIC SPINE - 2 VIEW + SWIMMERS  Comparison: CT chest of 02/19/2011  Findings:  Moderate to severe thoracolumbar scoliosis is again noted  with associated degenerative change. No acute compression  deformity is seen. No paravertebral soft tissue swelling is noted.  Multiple surgical clips are present overlying the upper abdomen and  an IVC filter is noted.  IMPRESSION:  Moderate to severe thoracolumbar scoliosis with degenerative  change. No acute abnormality.  Original Report Authenticated By: Juline Patch, M.D.         Assessment & Plan:  1. Lumbago with pain radiating through low back and into lower extremities especially with ambulation consistent with diagnosis of lumbar spinal stenosis. Patient had been able to walk about an hour 2 years ago and is down to about 10 minutes now.  She's been tried on gabapentin, she's been in physical therapy, and is currently in a pool program. She is interested in considering repeat epidural steroid injection.   Epidural with Dr. Alvester Morin was in  July of 2012. She underwent epidural at L4-5. Her last MRI was 3 years ago will repeat MRI prior to repeating epidural steroid injection. Patient reports fall a few weeks ago. She had an x-ray however she has had increased pain in her low back and legs.     2. History of scoliosis documented at age 72 per patient report  3. Bilateral leg pain with ambulation likely secondary to lumbar spinal stenosis MRI reports from 2009 in 2011 were reviewed with the patient today.  There has been a interval worsening of function with respect to ambulation and pain since 2011.  Ibuprofen 800 mg 3 times a day refilled, risks and benefits reviewed, patient reports difficulty  functioning without ibuprofen.  She will try to cut them in half to try to decrease dosage.  Will review Dr. Hardwick Blas notes     Will consider MRI lumbar spine to evaluate for progression prior to epidural steroid injection  Patient is also considering medial branch blocks. We will discuss further at next visit.  She continues  to use gabapentin.  Tramadol was switched to hydrocodone 5/325 qid.    (11.13.2013 EGD with biopsy per Dr Dulce Sellar Awaiting biopsy results, Ulcer noted. No ASA/NSAIDs. )  Will avoid NSAIDs and I have reviewed her. I have explained that she should not take any ibuprofen or Naprosyn. She stated she understood.  Followup in one month

## 2012-03-25 ENCOUNTER — Ambulatory Visit
Admission: RE | Admit: 2012-03-25 | Discharge: 2012-03-25 | Disposition: A | Discharge status: Home / Self Care | Payer: Medicaid Other | Source: Ambulatory Visit | Attending: Physical Medicine and Rehabilitation | Admitting: Physical Medicine and Rehabilitation

## 2012-03-25 DIAGNOSIS — M79605 Pain in left leg: Secondary | ICD-10-CM

## 2012-03-25 DIAGNOSIS — M79604 Pain in right leg: Secondary | ICD-10-CM

## 2012-03-25 DIAGNOSIS — M549 Dorsalgia, unspecified: Secondary | ICD-10-CM

## 2012-04-07 ENCOUNTER — Encounter: Payer: Self-pay | Admitting: Physical Medicine and Rehabilitation

## 2012-04-07 ENCOUNTER — Encounter (HOSPITAL_BASED_OUTPATIENT_CLINIC_OR_DEPARTMENT_OTHER): Payer: Medicaid Other | Admitting: Physical Medicine and Rehabilitation

## 2012-04-07 VITALS — BP 107/64 | HR 60 | Resp 16 | Ht 66.0 in | Wt 201.0 lb

## 2012-04-07 DIAGNOSIS — R52 Pain, unspecified: Secondary | ICD-10-CM

## 2012-04-07 DIAGNOSIS — M25559 Pain in unspecified hip: Secondary | ICD-10-CM

## 2012-04-07 DIAGNOSIS — G5790 Unspecified mononeuropathy of unspecified lower limb: Secondary | ICD-10-CM

## 2012-04-07 DIAGNOSIS — Z5181 Encounter for therapeutic drug level monitoring: Secondary | ICD-10-CM

## 2012-04-07 DIAGNOSIS — G5793 Unspecified mononeuropathy of bilateral lower limbs: Secondary | ICD-10-CM

## 2012-04-07 DIAGNOSIS — M545 Low back pain, unspecified: Secondary | ICD-10-CM

## 2012-04-07 DIAGNOSIS — M25551 Pain in right hip: Secondary | ICD-10-CM

## 2012-04-07 DIAGNOSIS — G8929 Other chronic pain: Secondary | ICD-10-CM

## 2012-04-07 MED ORDER — METHYLPREDNISOLONE 4 MG PO KIT: PACK | ORAL | Status: DC

## 2012-04-07 MED ORDER — GABAPENTIN 300 MG PO CAPS: 300.0000 mg | ORAL_CAPSULE | Freq: Every day | ORAL | Status: DC

## 2012-04-07 MED ORDER — TRAMADOL HCL 50 MG PO TABS: 50.0000 mg | ORAL_TABLET | Freq: Four times a day (QID) | ORAL | Status: DC | PRN

## 2012-04-07 NOTE — Patient Instructions (Addendum)
You mentioned that the tramadol again better relief and the hydrocodone so I'm switching you back to tramadol  I am also starting you on a Medrol dose pack  Please do not use ibuprofen or Naprosyn  I have reviewed your lumbar MRI using a spine model  We have discussed various treatment options.  You mentioned you're interested in an epidural steroid injection in your low back, will have the set up for you.  I also gave you some information on medial branch blocks for the lumbar spine  Please try to use your rolling walker rather than a cane  I will see you back next month

## 2012-04-07 NOTE — Progress Notes (Signed)
Subjective:    Patient ID: [name], female    DOB: 05/30/1955, 56 y.o.   MRN: [id number]  HPI  Patient is a 56 year old woman who has multiple pain complaints. [name] with chronic low back and neck pain, moderate to severe thoracic scoliosis, cervical degenerative disc disease in 2012 seen by Dr. Venetia Maxon, bilateral leg pain consistent with spinal stenosis.   She is here today for refill of her pain medication. She has had a recent flareup in leg pain/posterior thighs specially while walking. She was given hydrocodone last visit however she states tramadol works better for her she is also interested in considering epidural steroid injection. She was given information regarding epidural storage steroids at the last visit.  She is also here to review her MRI results which are noted previously in the chart.     Pain Inventory Average Pain 7 Pain Right Now 9 My pain is dull, stabbing and aching  In the last 24 hours, has pain interfered with the following? General activity 3 Relation with others 3 Enjoyment of life 3 What TIME of day is your pain at its worst? day and evening Sleep (in general) Fair  Pain is worse with: walking, bending and standing Pain improves with: rest and medication Relief from Meds: 5  Mobility use a cane how many minutes can you walk? 5 ability to climb steps?  no do you drive?  yes needs help with transfers Do you have any goals in this area?  yes  Function retired I need assistance with the following:  dressing, bathing, meal prep, household duties and shopping Do you have any goals in this area?  yes  Neuro/Psych bladder control problems bowel control problems weakness trouble walking depression  Prior Studies Any changes since last visit?  no  Physicians involved in your care Any changes since last visit?  no   Family History  Problem Relation Age of Onset  . Diabetes Mother   . Heart disease  Father   . Hypertension Father    History   Social History  . Marital Status: Widowed    Spouse Name: N/A    Number of Children: N/A  . Years of Education: N/A   Social History Main Topics  . Smoking status: Former Smoker    Quit date: 04/19/2009  . Smokeless tobacco: Never Used  . Alcohol Use: No  . Drug Use: No  . Sexually Active: None   Other Topics Concern  . None   Social History Narrative  . None   Past Surgical History  Procedure Date  . Gastric by-pass   . Hernia repair   . Cholecystectomy 2007  . Tonsillectomy     as child  . Dilation and curettage of uterus   . Esophagogastroduodenoscopy (egd) with propofol 03/01/2012    Procedure: ESOPHAGOGASTRODUODENOSCOPY (EGD) WITH PROPOFOL;  Surgeon: Willis Modena, MD;  Location: [facility];  Service: Endoscopy;  Laterality: N/A;   Past Medical History  Diagnosis Date  . Fibromyalgia   . Hypertension   . Depression   . Anxiety   . GERD (gastroesophageal reflux disease)   . Anemia    BP 107/64  Pulse 60  Resp 16  Ht 5\' 6"  (1.676 m)  Wt 201 lb (91.173 kg)  BMI 32.44 kg/m2  SpO2 97%     Review of Systems  Musculoskeletal: Positive for myalgias, back pain, arthralgias and gait problem.  Neurological: Positive for weakness.  Psychiatric/Behavioral: Positive for dysphoric mood.  All  other systems reviewed and are negative.       Objective:   Physical Exam She is an obese woman who appears her stated age and does not appear in any distress. She is oriented x3 speech is clear affect is bright she's alert cooperative and pleasant follows commands with difficulty and his questions appropriately  Cranial nerves are intact. Coordination is good in the upper extremities.  Motor strength is generally good in the upper extremities however she has weak hip flexors bilaterally 3 minus over 5.  Reflexes are with 2+ in the upper extremities without abnormal tone. Hoffmann sign is negative  Reflexes are diminished  in both lower extremities without abnormal tone.  Hip extensors are somewhat weak too and she has some difficulty and getting a chair without use of her upper extremities.   Slow and careful gait -uses a cane today, has a walker at home.  Tandem gait and Rhombergs tests are not assessed due to safety concerns.  She reports intact sensation in the upper and lower extremities to light touch but has some decreased vibratory sensation in both lower extremities, especially lower legs and feet medially.   Extension of lumbar spine is limited and she reports discomfort in the low back which radiates through both lower extremities posteriorly to the knees.   Cervical range of motion is mildly limited and she reports pain in all planes  Shoulder range of motion is mildly limited bilaterally      Scoliosis noted, no spinal tenderness appreciated cervical thoracic or lumbar. Left shoulder higher than right shoulder.        *RADIOLOGY REPORT*  Clinical Data: Back and bilateral leg pain.  MRI LUMBAR SPINE WITHOUT CONTRAST 03/26/12 Technique: Multiplanar and multiecho pulse sequences of the lumbar  spine were obtained without intravenous contrast.  Comparison: 02/23/2010  Findings: The sagittal MR images demonstrate stable alignment of  the lumbar vertebral bodies. Advanced degenerative disc disease  again noted at L5-S1. The vertebral bodies demonstrate normal  marrow signal except for endplate reactive changes and Schmorl's  nodes. The facets are normally aligned. Advanced facet disease  but no pars defects.  L1-2: No significant findings.  L2-3: Mild diffuse annular bulge but no spinal or lateral recess  stenosis. There is mild foraminal encroachment on the left.  L3-4: Mild diffuse annular bulge with slight flattening of the  ventral thecal sac. There is mild foraminal encroachment  bilaterally, left greater than right.  L4-5: Diffuse bulging annulus, osteophytic ridging, short   pedicles, facet disease and a small left sided synovial cyst  contribute to moderate spinal and bilateral lateral recess  stenosis. There is also mild to moderate foraminal stenosis  bilaterally. These findings appear relatively stable.  L5-S1: Advanced degenerative disc disease with disc space  narrowing, osteophytic ridging and bulging degenerated disc. This  in combination with advanced facet disease contributes to mild  spinal and bilateral lateral recess stenosis. No significant  foraminal stenosis. The findings appear stable.  IMPRESSION:  Multilevel disc disease and severe facet disease contributing to  spinal, lateral recess and foraminal stenosis as discussed above at  the individual levels. L4-5 is the most significant level. No  significant change since prior study.  Original Report Authenticated By: Rudie Meyer, M.D.  Assessment & Plan:  1. Lumbago with pain radiating through low back and into lower extremities especially with ambulation consistent with diagnosis of lumbar spinal stenosis. Patient had been able to walk about an hour 2 years ago and is down  to about 10 minutes now.  She is using gabapentin, she's been in physical therapy, and had been in  a pool program. She is interested in considering repeat epidural steroid injection.    Epidural with Dr. Alvester Morin was in July of 2012. She underwent epidural at L4-5. Her last MRI was 3 years ago will repeat MRI prior to repeating epidural steroid injection. Patient reports fall a few weeks ago. She had an x-ray however she has had increased pain in her low back and legs.    2. History of scoliosis documented at age 63 per patient report   3. Bilateral leg pain with ambulation likely secondary to lumbar spinal stenosis.  There has been a interval worsening of function with respect to ambulation and pain since 2011.    Patient is also considering medial branch blocks. I have given her a brochure on this today .  She  continues to use gabapentin.  Requests switch back to tramadol. Patient notes better pain relief with tramadol rather than hydrocodone.  Medrol dose pack today we'll try to get set up for next couple of weeks for epidural steroid injection.  And is comfortable with current plan I'll see her back in a month   (11.13.2013 EGD with biopsy per Dr Dulce Sellar .Marland KitchenMarland KitchenMarland KitchenNo ASA/NSAIDs. )   Will avoid NSAIDs. I have explained that she should not take any ibuprofen or Naprosyn. She stated she understood.

## 2012-05-05 ENCOUNTER — Encounter: Payer: Medicaid Other | Admitting: Physical Medicine and Rehabilitation

## 2012-05-09 ENCOUNTER — Encounter: Payer: Self-pay | Admitting: Physical Medicine & Rehabilitation

## 2012-05-09 ENCOUNTER — Ambulatory Visit (HOSPITAL_BASED_OUTPATIENT_CLINIC_OR_DEPARTMENT_OTHER): Payer: Medicaid Other | Admitting: Physical Medicine & Rehabilitation

## 2012-05-09 ENCOUNTER — Encounter: Payer: Medicaid Other | Attending: Neurosurgery

## 2012-05-09 VITALS — BP 110/52 | HR 61 | Resp 14 | Ht 67.0 in | Wt 198.0 lb

## 2012-05-09 DIAGNOSIS — R52 Pain, unspecified: Secondary | ICD-10-CM | POA: Insufficient documentation

## 2012-05-09 DIAGNOSIS — M79609 Pain in unspecified limb: Secondary | ICD-10-CM | POA: Insufficient documentation

## 2012-05-09 DIAGNOSIS — IMO0002 Reserved for concepts with insufficient information to code with codable children: Secondary | ICD-10-CM

## 2012-05-09 DIAGNOSIS — Z5181 Encounter for therapeutic drug level monitoring: Secondary | ICD-10-CM | POA: Insufficient documentation

## 2012-05-09 DIAGNOSIS — M5416 Radiculopathy, lumbar region: Secondary | ICD-10-CM

## 2012-05-09 DIAGNOSIS — M549 Dorsalgia, unspecified: Secondary | ICD-10-CM | POA: Insufficient documentation

## 2012-05-09 NOTE — Progress Notes (Signed)
  PROCEDURE RECORD [facility]   Name: [name] DOB:28-May-1955 MRN: [id number]  Date:05/09/2012  Physician: Claudette Laws, MD    Nurse/CMA: Kelli Churn, CMA/Walker, CMA  Allergies:  Allergies  Allergen Reactions  . Carafate (Sucralfate)   . Sulfonamide Derivatives Rash    REACTION: Syncope    Consent Signed: yes  Is patient diabetic? no   Pregnant: no LMP: No LMP recorded. Patient is postmenopausal. (age 59-55)  Anticoagulants: no Anti-inflammatory: no Antibiotics: yes (nitrofuratin 100mg  bid)  Procedure: epidural steroid injection  Position: Prone Start Time:  3:02 End Time:  3:08 Fluoro Time: 19  RN/CMA Ziyonna Christner, CMA Walker, CMA    Time 155pm 3:10    BP 110/52 122/55    Pulse 61 64    Respirations 14 14    O2 Sat 99 100    S/S 6 6    Pain Level 5/10 6/10     D/C home with Helen-sister, patient A & O X 3, D/C instructions reviewed, and sits independently.

## 2012-05-09 NOTE — Progress Notes (Signed)
Lumbar Right L4-5  transforaminal epidural steroid injection under fluoroscopic guidance  Indication: Lumbosacral radiculitis is not relieved by medication management or other conservative care and interfering with self-care and mobility.   Informed consent was obtained after describing risk and benefits of the procedure with the patient, this includes bleeding, bruising, infection, paralysis and medication side effects.  The patient wishes to proceed and has given written consent.  Patient was placed in prone position.  The lumbar area was marked and prepped with Betadine.  It was entered with a 25-gauge 1-1/2 inch needle and one mL of 1% lidocaine was injected into the skin and subcutaneous tissue.  Then a 22-gauge 3.5 inch spinal needle was inserted into the Right L4-5 intervertebral foramen under AP, lateral, and oblique view.  Then a solution containing one mL of 10 mg per mL dexamethasone and 2 mL of 1% lidocaine was injected.  The patient tolerated procedure well.  Post procedure instructions were given.  Please see post procedure form. 

## 2012-05-09 NOTE — Patient Instructions (Addendum)
Epidural Steroid Injection Care After  Refer to this sheet in the next few weeks. These instructions provide you with information on caring for yourself after your procedure. Your caregiver may also give you more specific instructions. Your treatment has been planned according to current medical practices, but problems sometimes occur. Call your caregiver if you have any problems or questions after your procedure. HOME CARE INSTRUCTIONS   Avoid the use of heat on the injection site for a day.  Do not have a tub bath or soak in water for the rest of the day.  Remove the bandage on the next day.  Resume your normal activities on the next day.  Use ice packs or mild pain relievers to reduce the soreness around the injection site.  Follow up with your caregiver 7 to 10 days after the procedure. SEEK MEDICAL CARE IF:   You develop a fever of more than 100.5 F (38.1 C).  You continue to have pain and soreness over the injection site even after taking medicines.  You develop significant nausea or vomiting. SEEK IMMEDIATE MEDICAL CARE IF:   You have severe back pain, which is not relieved by medicines.  You develop severe headache, stiff neck, or sensitivity to light.  You develop any new numbness or weakness of your legs.  You lose control over your bladder or bowel movements.  You develop a fever of more than 102 F (38.9 C).  You develop difficulty breathing. Document Released: 07/21/2010 Document Revised: 06/28/2011 Document Reviewed: 07/21/2010 [employer] Patient Information 2013 [employer].  

## 2012-05-24 ENCOUNTER — Encounter: Payer: Medicaid Other | Attending: Neurosurgery | Admitting: Physical Medicine and Rehabilitation

## 2012-05-24 ENCOUNTER — Encounter: Payer: Self-pay | Admitting: Physical Medicine and Rehabilitation

## 2012-05-24 ENCOUNTER — Other Ambulatory Visit: Payer: Self-pay | Admitting: Gastroenterology

## 2012-05-24 VITALS — BP 109/49 | HR 61 | Resp 14 | Ht 67.0 in | Wt 200.0 lb

## 2012-05-24 DIAGNOSIS — M79609 Pain in unspecified limb: Secondary | ICD-10-CM | POA: Insufficient documentation

## 2012-05-24 DIAGNOSIS — M542 Cervicalgia: Secondary | ICD-10-CM

## 2012-05-24 DIAGNOSIS — M545 Low back pain, unspecified: Secondary | ICD-10-CM | POA: Insufficient documentation

## 2012-05-24 DIAGNOSIS — M79645 Pain in left finger(s): Secondary | ICD-10-CM

## 2012-05-24 DIAGNOSIS — G5793 Unspecified mononeuropathy of bilateral lower limbs: Secondary | ICD-10-CM

## 2012-05-24 DIAGNOSIS — R112 Nausea with vomiting, unspecified: Secondary | ICD-10-CM

## 2012-05-24 DIAGNOSIS — G5790 Unspecified mononeuropathy of unspecified lower limb: Secondary | ICD-10-CM

## 2012-05-24 DIAGNOSIS — G8929 Other chronic pain: Secondary | ICD-10-CM | POA: Insufficient documentation

## 2012-05-24 MED ORDER — DICLOFENAC SODIUM 1 % TD GEL: 2.0000 g | Freq: Four times a day (QID) | TRANSDERMAL | Status: DC

## 2012-05-24 NOTE — Progress Notes (Deleted)
Subjective:    Patient ID: [name], female    DOB: [birth year], 57 y.o.   MRN: [id number]  HPI Patient is a 57 year old woman who has multiple pain complaints. 57 year old [place] American woman with chronic low back and neck pain, moderate to severe thoracic scoliosis, cervical degenerative disc disease in 2012 seen by Dr. Venetia Maxon, bilateral leg pain consistent with spinal stenosis.  She is here today for refill of her pain medication. She has had a recent flareup in leg pain/posterior thighs specially while walking.   Last visit she underwent:  05/09/12 Lumbar Right L4-5 transforaminal epidural steroid injection under fluoroscopic guidance  She reports 30% improvement in right leg/hip/back pain.  She is here for a refill on her medication.  She complains of insomnia. She reports drinking 4 cups of coffee per day.  Pain Inventory Average Pain 5 Pain Right Now 8 My pain is burning and aching  In the last 24 hours, has pain interfered with the following? General activity 5 Relation with others 3 Enjoyment of life 5 What TIME of day is your pain at its worst? morning and night Sleep (in general) Fair  Pain is worse with: walking, bending and standing Pain improves with: rest and medication Relief from Meds: 8  Mobility use a cane how many minutes can you walk? 3-5 ability to climb steps?  no do you drive?  yes Do you have any goals in this area?  yes  Function not employed: date last employed 2012 I need assistance with the following:  bathing, household duties and shopping Do you have any goals in this area?  yes  Neuro/Psych bladder control problems numbness trouble walking depression  Prior Studies Any changes since last visit?  no  Physicians involved in your care Any changes since last visit?  no   Family History  Problem Relation Age of Onset  . Diabetes Mother   . Heart disease Father   . Hypertension Father    History   Social History   . Marital Status: Widowed    Spouse Name: N/A    Number of Children: N/A  . Years of Education: N/A   Social History Main Topics  . Smoking status: Former Smoker    Quit date: 04/19/2009  . Smokeless tobacco: Never Used  . Alcohol Use: No  . Drug Use: No  . Sexually Active: None   Other Topics Concern  . None   Social History Narrative  . None   Past Surgical History  Procedure Date  . Gastric by-pass   . Hernia repair   . Cholecystectomy 2007  . Tonsillectomy     as child  . Dilation and curettage of uterus   . Esophagogastroduodenoscopy (egd) with propofol 03/01/2012    Procedure: ESOPHAGOGASTRODUODENOSCOPY (EGD) WITH PROPOFOL;  Surgeon: Willis Modena, MD;  Location: [facility];  Service: Endoscopy;  Laterality: N/A;   Past Medical History  Diagnosis Date  . Fibromyalgia   . Hypertension   . Depression   . Anxiety   . GERD (gastroesophageal reflux disease)   . Anemia    BP 109/49  Pulse 61  Resp 14  Ht 5\' 7"  (1.702 m)  Wt 200 lb (90.719 kg)  BMI 31.32 kg/m2  SpO2 98%     Review of Systems  Constitutional: Positive for appetite change and unexpected weight change.  Gastrointestinal: Positive for nausea and abdominal pain.  Musculoskeletal: Positive for back pain and gait problem.  Neurological: Positive for numbness.  Psychiatric/Behavioral: Positive for  dysphoric mood.  All other systems reviewed and are negative.       Objective:   Physical Exam  She is an obese woman who appears her stated age and does not appear in any distress. She is oriented x3 speech is clear affect is bright she's alert cooperative and pleasant follows commands with difficulty and his questions appropriately  Cranial nerves are intact. Coordination is good in the upper extremities.  Motor strength is generally good in the upper extremities however she has weak hip flexors bilaterally 3 minus over 5.  Reflexes are with 2+ in the upper extremities without abnormal tone.  Hoffmann sign is negative  Reflexes are diminished in both lower extremities without abnormal tone.  Hip extensors are somewhat weak too and she has some difficulty and getting a chair without use of her upper extremities.  Slow and careful gait -uses a cane today, has a walker at home.  Tandem gait and Rhombergs tests are not assessed due to safety concerns.  She reports intact sensation in the upper and lower extremities to light touch but has some decreased vibratory sensation in both lower extremities, especially lower legs and feet medially.  Extension of lumbar spine is limited and she reports discomfort in the low back which radiates through both lower extremities posteriorly to the knees.  Cervical range of motion is mildly limited and she reports pain in all planes  Shoulder range of motion is mildly limited bilaterally  Scoliosis noted, no spinal tenderness appreciated cervical thoracic or lumbar. Left shoulder higher than right shoulder.   Left upper extremity is evaluated today. She reports slightly decreased sensation to pinprick in the left thumb index and middle finger. She also has significant tenderness at the [facility] joint with a positive grind test.     Assessment & Plan:  1. Lumbago with pain radiating through low back and into lower extremities especially with ambulation consistent with diagnosis of lumbar spinal stenosis. Improved 30% after ESI  2. History of scoliosis documented at age 57 per patient report  3. Bilateral leg pain with ambulation likely secondary to lumbar spinal stenosis.   4. Pain located at the base of the left thumb with some mild sensory symptoms. Consistent with [facility] joint OA as well as mild carpal tunnel symptoms. Will give her a prescription for thumb spica today.     Patient is also considering medial branch blocks. I have given her a brochure on this in the past. .  She continues to use gabapentin and tramadol.  Information on sleep hygiene given  today as well.  (11.13.2013 EGD with biopsy per Dr Dulce Sellar .Marland KitchenMarland KitchenMarland KitchenNo ASA/NSAIDs. )   Will avoid NSAIDs. I have explained that she should not take any ibuprofen or Naprosyn. She stated she understood.

## 2012-05-24 NOTE — Progress Notes (Signed)
Subjective:    Patient ID: [name], female    DOB: [birth year], 57 y.o.   MRN: [id number]  Leg Pain  Associated symptoms include numbness.   Patient is a 57 year old woman who has multiple pain complaints. 57 year old [place] American woman with chronic low back and neck pain, moderate to severe thoracic scoliosis, cervical degenerative disc disease in 2012 seen by Dr. Venetia Maxon, bilateral leg pain consistent with spinal stenosis.  She is here today for refill of her pain medication. She has had a recent flareup in leg pain/posterior thighs specially while walking.   Last visit she underwent:  05/09/12 Lumbar Right L4-5 transforaminal epidural steroid injection under fluoroscopic guidance  She reports 30% improvement in right leg/hip/back pain.  She is here for a refill on her medication.  She complains of insomnia. She reports drinking 4 cups of coffee per day.  Pain Inventory Average Pain 5 Pain Right Now 8 My pain is burning and aching  In the last 24 hours, has pain interfered with the following? General activity 5 Relation with others 3 Enjoyment of life 5 What TIME of day is your pain at its worst? morning and night Sleep (in general) Fair  Pain is worse with: walking, bending and standing Pain improves with: rest and medication Relief from Meds: 8  Mobility use a cane how many minutes can you walk? 3-5 ability to climb steps?  no do you drive?  yes Do you have any goals in this area?  yes  Function not employed: date last employed 2012 I need assistance with the following:  bathing, household duties and shopping Do you have any goals in this area?  yes  Neuro/Psych bladder control problems numbness trouble walking depression  Prior Studies Any changes since last visit?  no  Physicians involved in your care Any changes since last visit?  no   Family History  Problem Relation Age of Onset  . Diabetes Mother   . Heart disease Father   .  Hypertension Father    History   Social History  . Marital Status: Widowed    Spouse Name: N/A    Number of Children: N/A  . Years of Education: N/A   Social History Main Topics  . Smoking status: Former Smoker    Quit date: 04/19/2009  . Smokeless tobacco: Never Used  . Alcohol Use: No  . Drug Use: No  . Sexually Active: None   Other Topics Concern  . None   Social History Narrative  . None   Past Surgical History  Procedure Date  . Gastric by-pass   . Hernia repair   . Cholecystectomy 2007  . Tonsillectomy     as child  . Dilation and curettage of uterus   . Esophagogastroduodenoscopy (egd) with propofol 03/01/2012    Procedure: ESOPHAGOGASTRODUODENOSCOPY (EGD) WITH PROPOFOL;  Surgeon: Willis Modena, MD;  Location: [facility];  Service: Endoscopy;  Laterality: N/A;   Past Medical History  Diagnosis Date  . Fibromyalgia   . Hypertension   . Depression   . Anxiety   . GERD (gastroesophageal reflux disease)   . Anemia    BP 109/49  Pulse 61  Resp 14  Ht 5\' 7"  (1.702 m)  Wt 200 lb (90.719 kg)  BMI 31.32 kg/m2  SpO2 98%     Review of Systems  Constitutional: Positive for appetite change and unexpected weight change.  Gastrointestinal: Positive for nausea and abdominal pain.  Musculoskeletal: Positive for back pain and gait problem.  Neurological: Positive for numbness.  Psychiatric/Behavioral: Positive for dysphoric mood.  All other systems reviewed and are negative.       Objective:   Physical Exam  She is an obese woman who appears her stated age and does not appear in any distress. She is oriented x3 speech is clear affect is bright she's alert cooperative and pleasant follows commands with difficulty and his questions appropriately  Cranial nerves are intact. Coordination is good in the upper extremities.  Motor strength is generally good in the upper extremities however she has weak hip flexors bilaterally 3 minus over 5.  Reflexes are with  2+ in the upper extremities without abnormal tone. Hoffmann sign is negative  Reflexes are diminished in both lower extremities without abnormal tone.  Hip extensors are somewhat weak too and she has some difficulty and getting a chair without use of her upper extremities.  Slow and careful gait -uses a cane today, has a walker at home.  Tandem gait and Rhombergs tests are not assessed due to safety concerns.  She reports intact sensation in the upper and lower extremities to light touch but has some decreased vibratory sensation in both lower extremities, especially lower legs and feet medially.  Extension of lumbar spine is limited and she reports discomfort in the low back which radiates through both lower extremities posteriorly to the knees.  Cervical range of motion is mildly limited and she reports pain in all planes  Shoulder range of motion is mildly limited bilaterally  Scoliosis noted, no spinal tenderness appreciated cervical thoracic or lumbar. Left shoulder higher than right shoulder.   Left upper extremity is evaluated today. She reports slightly decreased sensation to pinprick in the left thumb index and middle finger. She also has significant tenderness at the [facility] joint with a positive grind test.     Assessment & Plan:  1. Lumbago with pain radiating through low back and into lower extremities especially with ambulation consistent with diagnosis of lumbar spinal stenosis. Improved 30% after ESI  2. History of scoliosis documented at age 50 per patient report  3. Bilateral leg pain with ambulation likely secondary to lumbar spinal stenosis.   4. Pain located at the base of the left thumb with some mild sensory symptoms. Consistent with [facility] joint OA as well as mild carpal tunnel symptoms. Will give her a prescription for thumb spica today.     Patient is also considering medial branch blocks. I have given her a brochure on this in the past. .  She continues to use gabapentin  and tramadol.  Information on sleep hygiene given today as well.  (11.13.2013 EGD with biopsy per Dr Dulce Sellar .Marland KitchenMarland KitchenMarland KitchenNo ASA/NSAIDs. )   Will avoid NSAIDs. I have explained that she should not take any ibuprofen or Naprosyn. She stated she understood.

## 2012-05-24 NOTE — Patient Instructions (Addendum)
F/u in one month      Carpal Tunnel Release Carpal tunnel release is done to relieve the pressure on the nerves and tendons on the bottom side of your wrist.  LET YOUR CAREGIVER KNOW ABOUT:   Allergies to food or medicine.  Medicines taken, including vitamins, herbs, eyedrops, over-the-counter medicines, and creams.  Use of steroids (by mouth or creams).  Previous problems with anesthetics or numbing medicines.  History of bleeding problems or blood clots.  Previous surgery.  Other health problems, including diabetes and kidney problems.  Possibility of pregnancy, if this applies. RISKS AND COMPLICATIONS  Some problems that may happen after this procedure include:  Infection.  Damage to the nerves, arteries or tendons could occur. This would be very uncommon.  Bleeding. BEFORE THE PROCEDURE   This surgery may be done while you are asleep (general anesthetic) or may be done under a block where only your forearm and the surgical area is numb.  If the surgery is done under a block, the numbness will gradually wear off within several hours after surgery. HOME CARE INSTRUCTIONS   Have a responsible person with you for 24 hours.  Do not drive a car or use public transportation for 24 hours.  Only take over-the-counter or prescription medicines for pain, discomfort, or fever as directed by your caregiver. Take them as directed.  You may put ice on the palm side of the affected wrist.  Put ice in a plastic bag.  Place a towel between your skin and the bag.  Leave the ice on for 20 to 30 minutes, 4 times per day.  If you were given a splint to keep your wrist from bending, use it as directed. It is important to wear the splint at night or as directed. Use the splint for as long as you have pain or numbness in your hand, arm, or wrist. This may take 1 to 2 months.  Keep your hand raised (elevated) above the level of your heart as much as possible. This keeps swelling  down and helps with discomfort.  Change bandages (dressings) as directed.  Keep the wound clean and dry. SEEK MEDICAL CARE IF:   You develop pain not relieved with medications.  You develop numbness of your hand.  You develop bleeding from your surgical site.  You have an oral temperature above 102 F (38.9 C).  You develop redness or swelling of the surgical site.  You develop new, unexplained problems. SEEK IMMEDIATE MEDICAL CARE IF:   You develop a rash.  You have difficulty breathing.  You develop any reaction or side effects to medications given. Document Released: 06/26/2003 Document Revised: 06/28/2011 Document Reviewed: 02/09/2007 [facility] Patient Information 2013 [place], [place].     Insomnia Insomnia is frequent trouble falling and/or staying asleep. Insomnia can be a long term problem or a short term problem. Both are common. Insomnia can be a short term problem when the wakefulness is related to a certain stress or worry. Long term insomnia is often related to ongoing stress during waking hours and/or poor sleeping habits. Overtime, sleep deprivation itself can make the problem worse. Every little thing feels more severe because you are overtired and your ability to cope is decreased. CAUSES   Stress, anxiety, and depression.  Poor sleeping habits.  Distractions such as TV in the bedroom.  Naps close to bedtime.  Engaging in emotionally charged conversations before bed.  Technical reading before sleep.  Alcohol and other sedatives. They may make the  problem worse. They can hurt normal sleep patterns and normal dream activity.  Stimulants such as caffeine for several hours prior to bedtime.  Pain syndromes and shortness of breath can cause insomnia.  Exercise late at night.  Changing time zones may cause sleeping problems (jet lag). It is sometimes helpful to have someone observe your sleeping patterns. They should look for periods of not breathing  during the night (sleep apnea). They should also look to see how long those periods last. If you live alone or observers are uncertain, you can also be observed at a sleep clinic where your sleep patterns will be professionally monitored. Sleep apnea requires a checkup and treatment. Give your caregivers your medical history. Give your caregivers observations your family has made about your sleep.  SYMPTOMS   Not feeling rested in the morning.  Anxiety and restlessness at bedtime.  Difficulty falling and staying asleep. TREATMENT   Your caregiver may prescribe treatment for an underlying medical disorders. Your caregiver can give advice or help if you are using alcohol or other drugs for self-medication. Treatment of underlying problems will usually eliminate insomnia problems.  Medications can be prescribed for short time use. They are generally not recommended for lengthy use.  Over-the-counter sleep medicines are not recommended for lengthy use. They can be habit forming.  You can promote easier sleeping by making lifestyle changes such as:  Using relaxation techniques that help with breathing and reduce muscle tension.  Exercising earlier in the day.  Changing your diet and the time of your last meal. No night time snacks.  Establish a regular time to go to bed.  Counseling can help with stressful problems and worry.  Soothing music and white noise may be helpful if there are background noises you cannot remove.  Stop tedious detailed work at least one hour before bedtime. HOME CARE INSTRUCTIONS   Keep a diary. Inform your caregiver about your progress. This includes any medication side effects. See your caregiver regularly. Take note of:  Times when you are asleep.  Times when you are awake during the night.  The quality of your sleep.  How you feel the next day. This information will help your caregiver care for you.  Get out of bed if you are still awake after 15  minutes. Read or do some quiet activity. Keep the lights down. Wait until you feel sleepy and go back to bed.  Keep regular sleeping and waking hours. Avoid naps.  Exercise regularly.  Avoid distractions at bedtime. Distractions include watching television or engaging in any intense or detailed activity like attempting to balance the household checkbook.  Develop a bedtime ritual. Keep a familiar routine of bathing, brushing your teeth, climbing into bed at the same time each night, listening to soothing music. Routines increase the success of falling to sleep faster.  Use relaxation techniques. This can be using breathing and muscle tension release routines. It can also include visualizing peaceful scenes. You can also help control troubling or intruding thoughts by keeping your mind occupied with boring or repetitive thoughts like the old concept of counting sheep. You can make it more creative like imagining planting one beautiful flower after another in your backyard garden.  During your day, work to eliminate stress. When this is not possible use some of the previous suggestions to help reduce the anxiety that accompanies stressful situations. MAKE SURE YOU:   Understand these instructions.  Will watch your condition.  Will get help right away if  you are not doing well or get worse. Document Released: 04/02/2000 Document Revised: 06/28/2011 Document Reviewed: 05/03/2007 [facility] Patient Information 2013 [place], [place].

## 2012-05-31 ENCOUNTER — Ambulatory Visit
Admission: RE | Admit: 2012-05-31 | Discharge: 2012-05-31 | Disposition: A | Discharge status: Home / Self Care | Payer: Medicaid Other | Source: Ambulatory Visit | Attending: Gastroenterology | Admitting: Gastroenterology

## 2012-05-31 DIAGNOSIS — R112 Nausea with vomiting, unspecified: Secondary | ICD-10-CM

## 2012-06-21 ENCOUNTER — Encounter: Payer: Medicaid Other | Admitting: Physical Medicine and Rehabilitation

## 2012-07-17 ENCOUNTER — Other Ambulatory Visit: Payer: Self-pay

## 2012-07-17 MED ORDER — TRAMADOL HCL 50 MG PO TABS: 50.0000 mg | ORAL_TABLET | Freq: Four times a day (QID) | ORAL | Status: DC | PRN

## 2012-08-02 ENCOUNTER — Ambulatory Visit: Payer: Medicaid Other | Admitting: Physical Medicine and Rehabilitation

## 2012-09-21 ENCOUNTER — Encounter: Payer: Self-pay | Admitting: Physical Medicine & Rehabilitation

## 2012-09-21 ENCOUNTER — Ambulatory Visit (HOSPITAL_BASED_OUTPATIENT_CLINIC_OR_DEPARTMENT_OTHER): Payer: Medicare Other | Admitting: Physical Medicine & Rehabilitation

## 2012-09-21 ENCOUNTER — Encounter: Payer: Medicare Other | Attending: Physical Medicine & Rehabilitation

## 2012-09-21 VITALS — BP 132/75 | HR 75 | Resp 17 | Ht 67.5 in | Wt 219.0 lb

## 2012-09-21 DIAGNOSIS — M545 Low back pain, unspecified: Secondary | ICD-10-CM

## 2012-09-21 DIAGNOSIS — Z79899 Other long term (current) drug therapy: Secondary | ICD-10-CM | POA: Insufficient documentation

## 2012-09-21 DIAGNOSIS — M542 Cervicalgia: Secondary | ICD-10-CM | POA: Insufficient documentation

## 2012-09-21 DIAGNOSIS — M25519 Pain in unspecified shoulder: Secondary | ICD-10-CM

## 2012-09-21 DIAGNOSIS — R209 Unspecified disturbances of skin sensation: Secondary | ICD-10-CM | POA: Insufficient documentation

## 2012-09-21 DIAGNOSIS — G8929 Other chronic pain: Secondary | ICD-10-CM

## 2012-09-21 MED ORDER — TRAMADOL HCL 50 MG PO TABS: 50.0000 mg | ORAL_TABLET | Freq: Four times a day (QID) | ORAL | Status: DC | PRN

## 2012-09-21 MED ORDER — DICLOFENAC SODIUM 1 % TD GEL: 2.0000 g | Freq: Four times a day (QID) | TRANSDERMAL | Status: DC

## 2012-09-21 MED ORDER — GABAPENTIN 300 MG PO CAPS: 300.0000 mg | ORAL_CAPSULE | Freq: Every day | ORAL | Status: DC

## 2012-09-21 NOTE — Patient Instructions (Signed)
Next visit will be for the EMG test this is to look for carpal tunnel. If this is negative we may need to check a repeat MRI of the neck  Continue current medicines new prescription sent to pharmacy

## 2012-09-21 NOTE — Progress Notes (Signed)
Subjective:    Patient ID: [name], female    DOB: [birth year], 57 y.o.   MRN: [id number]  HPI Chief complaint is right hand pain numbness and weakness. This has been going on for a number of months but seems to be getting worse. There is also neck pain as well as pain on the right side of the body. No recent falls or trauma. Has had a neck MRI in the past which was reviewed. No evidence of significant spinal stenosis. There was evidence of multilevel degenerative disc.  Has been using thumb spica splint as well as diclofenac gel with some minor relief Pain Inventory  Average Pain 5  Pain Right Now 8  My pain is burning and aching  In the last 24 hours, has pain interfered with the following?  General activity 5  Relation with others 3  Enjoyment of life 5  What TIME of day is your pain at its worst? morning and night  Sleep (in general) Fair  Pain is worse with: walking, bending and standing  Pain improves with: rest and medication  Relief from Meds: 8  Mobility  use a cane  how many minutes can you walk? 3-5  ability to climb steps? no  do you drive? yes  Do you have any goals in this area? yes  Function  not employed: date last employed 2012  I need assistance with the following: bathing, household duties and shopping  Do you have any goals in this area? yes  Neuro/Psych  bladder control problems  numbness  trouble walking  depression  Prior Studies  Any changes since last visit? no  Physicians involved in your care  Any changes since last visit? no    Family History  Problem Relation Age of Onset  . Diabetes Mother   . Heart disease Father   . Hypertension Father    History   Social History  . Marital Status: Widowed    Spouse Name: N/A    Number of Children: N/A  . Years of Education: N/A   Social History Main Topics  . Smoking status: Former Smoker    Quit date: 04/19/2009  . Smokeless tobacco: Never Used  . Alcohol Use: No  . Drug Use:  No  . Sexually Active: None   Other Topics Concern  . None   Social History Narrative  . None   Past Surgical History  Procedure Laterality Date  . Gastric by-pass    . Hernia repair    . Cholecystectomy  2007  . Tonsillectomy      as child  . Dilation and curettage of uterus    . Esophagogastroduodenoscopy (egd) with propofol  03/01/2012    Procedure: ESOPHAGOGASTRODUODENOSCOPY (EGD) WITH PROPOFOL;  Surgeon: Willis Modena, MD;  Location: [facility];  Service: Endoscopy;  Laterality: N/A;   Past Medical History  Diagnosis Date  . Fibromyalgia   . Hypertension   . Depression   . Anxiety   . GERD (gastroesophageal reflux disease)   . Anemia    BP 132/75  Pulse 75  Resp 17  Ht 5' 7.5" (1.715 m)  Wt 219 lb (99.338 kg)  BMI 33.77 kg/m2  SpO2 97%     Review of Systems  Respiratory: Positive for shortness of breath.   Gastrointestinal: Positive for nausea and diarrhea.  Genitourinary: Positive for dysuria and difficulty urinating.  Musculoskeletal: Positive for back pain and gait problem.  Skin: Positive for rash.  Neurological: Positive for weakness and numbness.  Tingling  Psychiatric/Behavioral: Positive for dysphoric mood.  All other systems reviewed and are negative.       Objective:   Physical Exam  Nursing note and vitals reviewed. Constitutional: She is oriented to person, place, and time. She appears well-developed and well-nourished.  Musculoskeletal:       Cervical back: She exhibits decreased range of motion and pain.       Right hand: She exhibits tenderness. Decreased sensation noted. Decreased sensation is present in the medial distribution and is present in the radial distribution.  Pain with cervical spine range of motion mostly toward the left Negative carpometacarpal stress test on right side     Neurological: She is alert and oriented to person, place, and time. She has normal strength. A sensory deficit is present.  Reflex  Scores:      Tricep reflexes are 3+ on the right side and 3+ on the left side.      Bicep reflexes are 3+ on the right side and 3+ on the left side.      Brachioradialis reflexes are 3+ on the right side and 3+ on the left side.      Patellar reflexes are 2+ on the right side and 2+ on the left side.      Achilles reflexes are 2+ on the right side and 2+ on the left side. Reduced sensation right C6 and right C7 dermatome Negative Tinel's Positive Phalen's   Psychiatric: She has a normal mood and affect.          Assessment & Plan:  1. Neck pain right arm pain and weakness. There is decreased sensation right C6 and right C7 dermatomes as well as some brisk reflexes on the right side. However the MRI of the cervical spine 2012 did not show any significant stenosis. Has had treatment mainly for wrist pain. Will check for carpal tunnel syndrome with EMG/MCV next visit. If this is normal then I would repeat MRI of the cervical spine. Continue tramadol 4 times per day Continue gabapentin 5 times per day Continue diclofenac gel  Over half of the 25 min visit was spent counseling and coordinating care.

## 2012-10-13 ENCOUNTER — Encounter: Payer: Self-pay | Admitting: Physical Medicine & Rehabilitation

## 2012-10-13 ENCOUNTER — Ambulatory Visit (HOSPITAL_BASED_OUTPATIENT_CLINIC_OR_DEPARTMENT_OTHER): Payer: Medicare Other | Admitting: Physical Medicine & Rehabilitation

## 2012-10-13 VITALS — BP 99/56 | HR 65 | Resp 14 | Wt 211.2 lb

## 2012-10-13 DIAGNOSIS — G5610 Other lesions of median nerve, unspecified upper limb: Secondary | ICD-10-CM

## 2012-10-13 NOTE — Progress Notes (Signed)
EMG performed 10/13/2012.  See EMG report under media tab.

## 2012-10-13 NOTE — Patient Instructions (Signed)
Wear wrist splints every night If no better in one month, will do injections

## 2012-10-26 ENCOUNTER — Encounter (HOSPITAL_COMMUNITY): Payer: Self-pay | Admitting: Pharmacy Technician

## 2012-10-30 ENCOUNTER — Encounter (HOSPITAL_COMMUNITY): Payer: Self-pay | Admitting: *Deleted

## 2012-10-31 ENCOUNTER — Other Ambulatory Visit: Payer: Self-pay | Admitting: Gastroenterology

## 2012-10-31 NOTE — Addendum Note (Signed)
Addended by: Willis Modena on: 10/31/2012 04:36 PM   Modules accepted: Orders

## 2012-11-01 ENCOUNTER — Emergency Department (INDEPENDENT_AMBULATORY_CARE_PROVIDER_SITE_OTHER): Payer: Medicare Other

## 2012-11-01 ENCOUNTER — Encounter (HOSPITAL_COMMUNITY)
Admission: RE | Disposition: A | Discharge status: Home / Self Care | Payer: Self-pay | Source: Ambulatory Visit | Attending: Gastroenterology

## 2012-11-01 ENCOUNTER — Ambulatory Visit (HOSPITAL_COMMUNITY)
Admission: RE | Admit: 2012-11-01 | Discharge: 2012-11-01 | Disposition: A | Discharge status: Home / Self Care | Payer: Medicare Other | Source: Ambulatory Visit | Attending: Gastroenterology | Admitting: Gastroenterology

## 2012-11-01 ENCOUNTER — Encounter (HOSPITAL_COMMUNITY): Payer: Self-pay | Admitting: Anesthesiology

## 2012-11-01 ENCOUNTER — Emergency Department (INDEPENDENT_AMBULATORY_CARE_PROVIDER_SITE_OTHER)
Admission: EM | Admit: 2012-11-01 | Discharge: 2012-11-01 | Disposition: A | Discharge status: Home / Self Care | Payer: Medicare Other | Source: Home / Self Care | Attending: Emergency Medicine | Admitting: Emergency Medicine

## 2012-11-01 ENCOUNTER — Encounter (HOSPITAL_COMMUNITY): Payer: Self-pay

## 2012-11-01 ENCOUNTER — Ambulatory Visit (HOSPITAL_COMMUNITY): Payer: Medicare Other | Admitting: Anesthesiology

## 2012-11-01 DIAGNOSIS — S92309A Fracture of unspecified metatarsal bone(s), unspecified foot, initial encounter for closed fracture: Secondary | ICD-10-CM

## 2012-11-01 DIAGNOSIS — S40019A Contusion of unspecified shoulder, initial encounter: Secondary | ICD-10-CM

## 2012-11-01 DIAGNOSIS — S43429A Sprain of unspecified rotator cuff capsule, initial encounter: Secondary | ICD-10-CM

## 2012-11-01 DIAGNOSIS — S43421A Sprain of right rotator cuff capsule, initial encounter: Secondary | ICD-10-CM

## 2012-11-01 DIAGNOSIS — S92302A Fracture of unspecified metatarsal bone(s), left foot, initial encounter for closed fracture: Secondary | ICD-10-CM

## 2012-11-01 DIAGNOSIS — S40011A Contusion of right shoulder, initial encounter: Secondary | ICD-10-CM

## 2012-11-01 HISTORY — DX: Adverse effect of unspecified anesthetic, initial encounter: T41.45XA

## 2012-11-01 HISTORY — DX: Other complications of anesthesia, initial encounter: T88.59XA

## 2012-11-01 SURGERY — EGD (ESOPHAGOGASTRODUODENOSCOPY)
Anesthesia: Monitor Anesthesia Care

## 2012-11-01 MED ORDER — HYDROCODONE-ACETAMINOPHEN 5-325 MG PO TABS
ORAL_TABLET | ORAL | Status: AC
  Filled 2012-11-01: qty 1

## 2012-11-01 MED ORDER — HYDROCODONE-ACETAMINOPHEN 5-325 MG PO TABS
1.0000 | ORAL_TABLET | Freq: Once | ORAL | Status: AC
  Administered 2012-11-01: 1 via ORAL

## 2012-11-01 MED ORDER — IBUPROFEN 800 MG PO TABS
800.0000 mg | ORAL_TABLET | Freq: Once | ORAL | Status: AC
  Administered 2012-11-01: 800 mg via ORAL

## 2012-11-01 MED ORDER — HYDROCODONE-IBUPROFEN 7.5-200 MG PO TABS: 1.0000 | ORAL_TABLET | Freq: Three times a day (TID) | ORAL | Status: DC | PRN

## 2012-11-01 MED ORDER — IBUPROFEN 800 MG PO TABS
ORAL_TABLET | ORAL | Status: AC
  Filled 2012-11-01: qty 1

## 2012-11-01 NOTE — Anesthesia Preprocedure Evaluation (Deleted)
Anesthesia Evaluation  Patient identified by MRN, date of birth, ID band Patient awake    Reviewed: Allergy & Precautions, H&P , NPO status , Patient's Chart, lab work & pertinent test results  Airway Mallampati: II TM Distance: >3 FB Neck ROM: Full    Dental no notable dental hx.    Pulmonary neg pulmonary ROS,  breath sounds clear to auscultation  Pulmonary exam normal       Cardiovascular hypertension, Pt. on medications Rhythm:Regular Rate:Normal     Neuro/Psych Anxiety negative neurological ROS     GI/Hepatic Neg liver ROS, GERD-  Medicated,  Endo/Other  negative endocrine ROS  Renal/GU negative Renal ROS  negative genitourinary   Musculoskeletal negative musculoskeletal ROS (+)   Abdominal   Peds negative pediatric ROS (+)  Hematology negative hematology ROS (+)   Anesthesia Other Findings   Reproductive/Obstetrics negative OB ROS                           Anesthesia Physical Anesthesia Plan  ASA: II  Anesthesia Plan: MAC   Post-op Pain Management:    Induction: Intravenous  Airway Management Planned: Nasal Cannula  Additional Equipment:   Intra-op Plan:   Post-operative Plan:   Informed Consent: I have reviewed the patients History and Physical, chart, labs and discussed the procedure including the risks, benefits and alternatives for the proposed anesthesia with the patient or authorized representative who has indicated his/her understanding and acceptance.   Dental advisory given  Plan Discussed with: CRNA and Surgeon  Anesthesia Plan Comments:         Anesthesia Quick Evaluation

## 2012-11-01 NOTE — ED Notes (Signed)
States she slipped and fell this am on wet walkway at home, injuring her right shoulder and left foot/ankle; denies LOC. Was scheduled for procedure at [facility] , but was canceled until she can be checked out and cleared for further evaluation

## 2012-11-01 NOTE — ED Provider Notes (Signed)
History    CSN: [id number] Arrival date & time 11/01/12  1046  None    Chief Complaint  Patient presents with  . Fall   (Consider location/radiation/quality/duration/timing/severity/associated sxs/prior Treatment) HPI Comments: Patient presents urgent care this morning reporting that at home when she was picking up the newspaper she slipped backwards falling on the right side of her back and shoulder on a wet walkWAY this morning, she also twisted her left foot and ankle. She denies any head injury loss of consciousness or feeling any symptoms prior to fall. She was scheduled this morning to have a upper endoscopy that was canceled as they recommended her to be evaluated after she sustained this fall.   She reports pain on the posterior aspect of her right shoulder ( points to scapular region) as well in the lateral aspect of her right upper extremity. Feels that his pain when she walks on her left foot and ankle.  Patient denies any associated shortness of breath, chest pain, numbness or tingling or weakness sensation of any upper or lower extremities.  Patient is a 57 y.o. female presenting with fall. The history is provided by the patient.  Fall This is a new problem. The current episode started 1 to 2 hours ago. The problem occurs constantly. The problem has not changed since onset.Pertinent negatives include no chest pain, no abdominal pain and no shortness of breath. The symptoms are aggravated by walking. The symptoms are relieved by rest. She has tried nothing for the symptoms. The treatment provided no relief.   Past Medical History  Diagnosis Date  . Hypertension   . Depression   . Anxiety   . GERD (gastroesophageal reflux disease)   . Anemia   . Fibromyalgia   . Complication of anesthesia yrs ago    sob, bad cough got oxygen after hernia repair   Past Surgical History  Procedure Laterality Date  . Gastric by-pass  20004  . Esophagogastroduodenoscopy (egd) with  propofol  03/01/2012    Procedure: ESOPHAGOGASTRODUODENOSCOPY (EGD) WITH PROPOFOL;  Surgeon: Willis Modena, MD;  Location: [facility];  Service: Endoscopy;  Laterality: N/A;  . Tonsillectomy      as child  . Dilation and curettage of uterus  yrs ago  . Hernia repair  2005  . Cholecystectomy  2007  . Ligament repair Right 1987   Family History  Problem Relation Age of Onset  . Diabetes Mother   . Heart disease Father   . Hypertension Father    History  Substance Use Topics  . Smoking status: Former Smoker -- 0.50 packs/day for 10 years    Types: Cigarettes    Quit date: 04/19/2009  . Smokeless tobacco: Never Used  . Alcohol Use: No   OB History   Grav Para Term Preterm Abortions TAB SAB Ect Mult Living                 Review of Systems  Constitutional: Negative for activity change and appetite change.  HENT: Negative for congestion, facial swelling, rhinorrhea, neck pain and neck stiffness.   Respiratory: Negative for cough, chest tightness and shortness of breath.   Cardiovascular: Negative for chest pain and leg swelling.  Gastrointestinal: Negative for abdominal pain.  Musculoskeletal: Negative for myalgias, back pain, joint swelling and arthralgias.  Skin: Negative for pallor.  Neurological: Negative for weakness and numbness.    Allergies  Carafate and Sulfonamide derivatives  Home Medications   Current Outpatient Rx  Name  Route  Sig  Dispense  Refill  . citalopram (CELEXA) 20 MG tablet   Oral   Take 20 mg by mouth every morning.          . diclofenac sodium (VOLTAREN) 1 % GEL   Topical   Apply 2 g topically 4 (four) times daily. Apply to base of left thumb   1 Tube   2   . ferrous sulfate 325 (65 FE) MG tablet   Oral   Take 325 mg by mouth 2 (two) times daily.          . furosemide (LASIX) 40 MG tablet   Oral   Take 40 mg by mouth daily as needed. For swelling         . gabapentin (NEURONTIN) 300 MG capsule   Oral   Take 1 capsule (300  mg total) by mouth 5 (five) times daily.   150 capsule   3   . HYDROcodone-ibuprofen (VICOPROFEN) 7.5-200 MG per tablet   Oral   Take 1 tablet by mouth every 8 (eight) hours as needed for pain.   15 tablet   0   . lisinopril-hydrochlorothiazide (PRINZIDE,ZESTORETIC) 10-12.5 MG per tablet   Oral   Take 1 tablet by mouth every morning.          . metoCLOPramide (REGLAN) 5 MG tablet   Oral   Take 5 mg by mouth 4 (four) times daily.         . metoprolol tartrate (LOPRESSOR) 25 MG tablet   Oral   Take 12.5 mg by mouth 2 (two) times daily.          . Multiple Vitamins-Minerals (MULTIVITAMINS THER. W/MINERALS) TABS   Oral   Take 1 tablet by mouth daily.          . nitroGLYCERIN (NITROSTAT) 0.4 MG SL tablet   Sublingual   Place 0.4 mg under the tongue every 5 (five) minutes as needed. For chest pain x 3 doses.         Marland Kitchen ondansetron (ZOFRAN) 4 MG tablet   Oral   Take 4 mg by mouth every 6 (six) hours as needed. For nausea         . pantoprazole (PROTONIX) 40 MG tablet   Oral   Take 1 tablet (40 mg total) by mouth 2 (two) times daily at 10 AM and 5 PM.         . potassium chloride SA (K-DUR,KLOR-CON) 20 MEQ tablet   Oral   Take 20 mEq by mouth 2 (two) times daily.          . promethazine (PHENERGAN) 25 MG tablet   Oral   Take 25 mg by mouth every 6 (six) hours as needed for nausea.         . traMADol (ULTRAM) 50 MG tablet   Oral   Take 1 tablet (50 mg total) by mouth every 6 (six) hours as needed for pain. Must las 30 days   120 tablet   2   . vitamin C (ASCORBIC ACID) 500 MG tablet   Oral   Take 500 mg by mouth daily.          BP 120/62  Pulse 50  Temp(Src) 97.9 F (36.6 C) (Oral)  Resp 14  SpO2 100% Physical Exam  Nursing note and vitals reviewed. Constitutional: Vital signs are normal. She appears well-developed and well-nourished.  Non-toxic appearance. She does not have a sickly appearance. She does not appear ill. No distress.  Neck:  Neck supple.  Abdominal: Soft.  Musculoskeletal: She exhibits tenderness.       Right shoulder: She exhibits decreased range of motion, tenderness, bony tenderness, deformity and pain. She exhibits no swelling, no effusion, no crepitus, no spasm and normal strength.       Left ankle: She exhibits decreased range of motion and swelling. She exhibits no ecchymosis, no deformity, no laceration and normal pulse. Tenderness. Lateral malleolus and medial malleolus tenderness found. Achilles tendon normal.       Arms:      Feet:  Neurological: She is alert.  Skin: No rash noted.    ED Course  Procedures (including critical care time) Labs Reviewed - No data to display Dg Scapula Right  11/01/2012   *RADIOLOGY REPORT*  Clinical Data: Fall with pain involving the right scapula.  RIGHT SCAPULA - 2+ VIEWS  Comparison: None  Findings: No visualized scapular fracture.  The glenohumeral joint shows normal alignment.  There are proliferative changes involving the distal clavicle and acromion.  No bony lesions or soft tissue abnormalities are seen.  IMPRESSION: Normal scapula.   Original Report Authenticated By: Irish Lack, M.D.   Dg Ankle Complete Left  11/01/2012   *RADIOLOGY REPORT*  Clinical Data: Fall, ankle pain  LEFT ANKLE COMPLETE - 3+ VIEW  Comparison: None.  Findings: Three views of the left ankle submitted.  No acute fracture or subluxation.  Ankle mortise is preserved.  IMPRESSION: No acute fracture or subluxation.   Original Report Authenticated By: Natasha Mead, M.D.   Dg Foot Complete Left  11/01/2012   *RADIOLOGY REPORT*  Clinical Data: Larey Seat.  Injured left foot.  LEFT FOOT - COMPLETE 3+ VIEW  Comparison: None  Findings: There are a nondisplaced fractures involving the first and second metatarsal shafts.  The joint spaces are maintained. Moderate osteopenia.  IMPRESSION:  First and second metatarsal shaft fractures.   Original Report Authenticated By: Rudie Meyer, M.D.   1. Fractured  metatarsal bone, left, closed, initial encounter   2. Contusion of shoulder region, right, initial encounter   3. Rotator cuff (capsule) sprain, right, initial encounter     MDM  Problem #1 The first and second metatarsal fractures of left foot ( nondisplaced). No neural vascular deficits.  As patient has a first metatarsal fracture will be temporarily immobilize with a Cam Walker and non-weight bearing crutches. To followup with the orthopedic doctor within the next 3-5 days. Patient was prescribed Vicoprofen discussed RICE measures with emphasis on elevation.   Written and verbal instruction to followup with the orthopedic Dr. was given to patient.   Problem #2 suprascapular focal tenderness. Right shoulder and scapular views reveal no obvious fractures. Exam were somewhat suspicious for a potential supraspinatal injury. Have also instructed patient to followup with the orthopedic Dr. about her posterior shoulder pain. No neural vascular deficits   Jimmie Molly, MD 11/01/12 1354

## 2012-11-03 ENCOUNTER — Other Ambulatory Visit (HOSPITAL_COMMUNITY): Payer: Self-pay | Admitting: Family Medicine

## 2012-11-03 ENCOUNTER — Ambulatory Visit (HOSPITAL_COMMUNITY)
Admission: RE | Admit: 2012-11-03 | Discharge: 2012-11-03 | Disposition: A | Discharge status: Home / Self Care | Payer: Medicare Other | Source: Ambulatory Visit | Attending: Family Medicine | Admitting: Family Medicine

## 2012-11-03 DIAGNOSIS — M79609 Pain in unspecified limb: Secondary | ICD-10-CM

## 2012-11-03 DIAGNOSIS — M7989 Other specified soft tissue disorders: Secondary | ICD-10-CM

## 2012-11-03 DIAGNOSIS — M25562 Pain in left knee: Secondary | ICD-10-CM

## 2012-11-03 NOTE — Progress Notes (Signed)
*  Preliminary Results* Left lower extremity venous duplex completed. Left lower extremity is negative for deep vein thrombosis. There is no evidence of left Baker's cyst.  Preliminary results discussed with Dr.Hilts.  11/03/2012 7:07 PM  Gertie Fey, RVT, RDCS, RDMS

## 2012-11-10 ENCOUNTER — Ambulatory Visit: Payer: Medicare Other | Admitting: Physical Medicine & Rehabilitation

## 2012-12-17 ENCOUNTER — Encounter (HOSPITAL_COMMUNITY): Payer: Self-pay

## 2012-12-17 ENCOUNTER — Emergency Department (HOSPITAL_COMMUNITY)
Admission: EM | Admit: 2012-12-17 | Discharge: 2012-12-17 | Disposition: A | Discharge status: Home / Self Care | Payer: No Typology Code available for payment source | Attending: Emergency Medicine | Admitting: Emergency Medicine

## 2012-12-17 ENCOUNTER — Emergency Department (HOSPITAL_COMMUNITY): Payer: No Typology Code available for payment source

## 2012-12-17 DIAGNOSIS — S0993XA Unspecified injury of face, initial encounter: Secondary | ICD-10-CM | POA: Diagnosis not present

## 2012-12-17 DIAGNOSIS — Z7982 Long term (current) use of aspirin: Secondary | ICD-10-CM | POA: Insufficient documentation

## 2012-12-17 DIAGNOSIS — IMO0002 Reserved for concepts with insufficient information to code with codable children: Secondary | ICD-10-CM | POA: Insufficient documentation

## 2012-12-17 DIAGNOSIS — D649 Anemia, unspecified: Secondary | ICD-10-CM | POA: Insufficient documentation

## 2012-12-17 DIAGNOSIS — I10 Essential (primary) hypertension: Secondary | ICD-10-CM | POA: Diagnosis not present

## 2012-12-17 DIAGNOSIS — K219 Gastro-esophageal reflux disease without esophagitis: Secondary | ICD-10-CM | POA: Insufficient documentation

## 2012-12-17 DIAGNOSIS — Y92410 Unspecified street and highway as the place of occurrence of the external cause: Secondary | ICD-10-CM | POA: Insufficient documentation

## 2012-12-17 DIAGNOSIS — F341 Dysthymic disorder: Secondary | ICD-10-CM | POA: Insufficient documentation

## 2012-12-17 DIAGNOSIS — Y9389 Activity, other specified: Secondary | ICD-10-CM | POA: Diagnosis not present

## 2012-12-17 DIAGNOSIS — Z87891 Personal history of nicotine dependence: Secondary | ICD-10-CM | POA: Diagnosis not present

## 2012-12-17 DIAGNOSIS — Z79899 Other long term (current) drug therapy: Secondary | ICD-10-CM | POA: Diagnosis not present

## 2012-12-17 DIAGNOSIS — M7918 Myalgia, other site: Secondary | ICD-10-CM

## 2012-12-17 MED ORDER — ONDANSETRON 4 MG PO TBDP
4.0000 mg | ORAL_TABLET | Freq: Once | ORAL | Status: AC
  Administered 2012-12-17: 4 mg via ORAL
  Filled 2012-12-17: qty 1

## 2012-12-17 MED ORDER — DIAZEPAM 5 MG PO TABS: 5.0000 mg | ORAL_TABLET | Freq: Two times a day (BID) | ORAL | Status: DC

## 2012-12-17 MED ORDER — HYDROCODONE-ACETAMINOPHEN 5-325 MG PO TABS
1.0000 | ORAL_TABLET | Freq: Once | ORAL | Status: AC
  Administered 2012-12-17: 1 via ORAL
  Filled 2012-12-17 (×2): qty 1

## 2012-12-17 MED ORDER — LORAZEPAM 1 MG PO TABS: 1.0000 mg | ORAL_TABLET | Freq: Once | ORAL | Status: DC
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
## 2012-12-17 NOTE — ED Provider Notes (Signed)
Medical screening examination/treatment/procedure(s) were performed by non-physician practitioner and as supervising physician I was immediately available for consultation/collaboration.  Karlisha Mathena L Lorelai Huyser, MD 12/17/12 2351 
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
## 2012-12-22 ENCOUNTER — Ambulatory Visit
Admission: RE | Admit: 2012-12-22 | Discharge: 2012-12-22 | Disposition: A | Discharge status: Home / Self Care | Payer: Medicare Other | Source: Ambulatory Visit | Attending: Cardiovascular Disease | Admitting: Cardiovascular Disease

## 2012-12-22 ENCOUNTER — Other Ambulatory Visit: Payer: Self-pay | Admitting: Cardiovascular Disease

## 2012-12-22 DIAGNOSIS — J40 Bronchitis, not specified as acute or chronic: Secondary | ICD-10-CM

## 2012-12-22 DIAGNOSIS — M549 Dorsalgia, unspecified: Secondary | ICD-10-CM

## 2013-01-17 ENCOUNTER — Encounter (HOSPITAL_COMMUNITY): Payer: Self-pay | Admitting: *Deleted

## 2013-01-17 DIAGNOSIS — I82409 Acute embolism and thrombosis of unspecified deep veins of unspecified lower extremity: Secondary | ICD-10-CM

## 2013-01-17 HISTORY — PX: INSERTION OF VENA CAVA FILTER: SHX5871

## 2013-01-17 HISTORY — DX: Acute embolism and thrombosis of unspecified deep veins of unspecified lower extremity: I82.409

## 2013-01-19 ENCOUNTER — Encounter (HOSPITAL_COMMUNITY): Payer: Self-pay | Admitting: Pharmacy Technician

## 2013-01-31 ENCOUNTER — Encounter (HOSPITAL_COMMUNITY): Payer: Medicare Other | Admitting: Anesthesiology

## 2013-01-31 ENCOUNTER — Ambulatory Visit (HOSPITAL_COMMUNITY)
Admission: RE | Admit: 2013-01-31 | Discharge: 2013-01-31 | Disposition: A | Discharge status: Home / Self Care | Payer: Medicare Other | Source: Ambulatory Visit | Attending: Gastroenterology | Admitting: Gastroenterology

## 2013-01-31 ENCOUNTER — Encounter (HOSPITAL_COMMUNITY)
Admission: RE | Disposition: A | Discharge status: Home / Self Care | Payer: Self-pay | Source: Ambulatory Visit | Attending: Gastroenterology

## 2013-01-31 ENCOUNTER — Encounter (HOSPITAL_COMMUNITY): Payer: Self-pay

## 2013-01-31 ENCOUNTER — Ambulatory Visit (HOSPITAL_COMMUNITY): Payer: Medicare Other | Admitting: Anesthesiology

## 2013-01-31 DIAGNOSIS — R109 Unspecified abdominal pain: Secondary | ICD-10-CM | POA: Insufficient documentation

## 2013-01-31 DIAGNOSIS — Z980 Intestinal bypass and anastomosis status: Secondary | ICD-10-CM | POA: Insufficient documentation

## 2013-01-31 DIAGNOSIS — K269 Duodenal ulcer, unspecified as acute or chronic, without hemorrhage or perforation: Secondary | ICD-10-CM | POA: Insufficient documentation

## 2013-01-31 DIAGNOSIS — R1310 Dysphagia, unspecified: Secondary | ICD-10-CM | POA: Insufficient documentation

## 2013-01-31 HISTORY — DX: Sleep apnea, unspecified: G47.30

## 2013-01-31 HISTORY — PX: ESOPHAGOGASTRODUODENOSCOPY (EGD) WITH PROPOFOL: SHX5813

## 2013-01-31 HISTORY — PX: BALLOON DILATION: SHX5330

## 2013-01-31 HISTORY — DX: Cardiac arrhythmia, unspecified: I49.9

## 2013-01-31 HISTORY — DX: Acute embolism and thrombosis of unspecified deep veins of unspecified lower extremity: I82.409

## 2013-01-31 HISTORY — DX: Cardiac murmur, unspecified: R01.1

## 2013-01-31 SURGERY — ESOPHAGOGASTRODUODENOSCOPY (EGD) WITH PROPOFOL
Anesthesia: Monitor Anesthesia Care

## 2013-01-31 MED ORDER — PROPOFOL INFUSION 10 MG/ML OPTIME
INTRAVENOUS | Status: DC | PRN
  Administered 2013-01-31: 100 ug/kg/min via INTRAVENOUS

## 2013-01-31 MED ORDER — LACTATED RINGERS IV SOLN
INTRAVENOUS | Status: DC | PRN
  Administered 2013-01-31: 08:00:00 via INTRAVENOUS

## 2013-01-31 MED ORDER — BUTAMBEN-TETRACAINE-BENZOCAINE 2-2-14 % EX AERO
INHALATION_SPRAY | CUTANEOUS | Status: DC | PRN
  Administered 2013-01-31: 2 via TOPICAL

## 2013-01-31 MED ORDER — KETAMINE HCL 50 MG/ML IJ SOLN
INTRAMUSCULAR | Status: DC | PRN
  Administered 2013-01-31: 25 mg via INTRAMUSCULAR

## 2013-01-31 MED ORDER — SODIUM CHLORIDE 0.9 % IV SOLN: INTRAVENOUS | Status: DC

## 2013-01-31 MED ORDER — PROMETHAZINE HCL 25 MG/ML IJ SOLN: 6.2500 mg | INTRAMUSCULAR | Status: DC | PRN

## 2013-01-31 MED ORDER — LACTATED RINGERS IV SOLN
INTRAVENOUS | Status: DC
  Administered 2013-01-31: 1000 mL via INTRAVENOUS

## 2013-01-31 MED ORDER — PANTOPRAZOLE SODIUM 40 MG PO TBEC: 40.0000 mg | DELAYED_RELEASE_TABLET | Freq: Every day | ORAL | Status: DC

## 2013-01-31 SURGICAL SUPPLY — 15 items

## 2013-01-31 NOTE — Transfer of Care (Signed)
Immediate Anesthesia Transfer of Care Note  Patient: [name]  Procedure(s) Performed: Procedure(s): ESOPHAGOGASTRODUODENOSCOPY (EGD) WITH PROPOFOL (N/A) BALLOON DILATION (N/A)  Patient Location: PACU  Anesthesia Type:MAC  Level of Consciousness: awake, alert  and oriented  Airway & Oxygen Therapy: Patient Spontanous Breathing and Patient connected to nasal cannula oxygen  Post-op Assessment: Report given to PACU RN and Post -op Vital signs reviewed and stable  Post vital signs: Reviewed and stable  Complications: No apparent anesthesia complications

## 2013-01-31 NOTE — Op Note (Signed)
[facility] [address] [place], [address]   ENDOSCOPY PROCEDURE REPORT  PATIENT: [name], [name]  MR#: [id number] BIRTHDATE: 1955/12/31 , 57  yrs. old GENDER: Female ENDOSCOPIST: Willis Modena, MD REFERRED BY:  Orpah Cobb, MD PROCEDURE DATE:  01/31/2013 PROCEDURE:  EGD, diagnostic ASA CLASS:     Class II INDICATIONS:  abdominal pain, dysphagia, history duodenal ulcer. MEDICATIONS: MAC anesthesia, per CRNA TOPICAL ANESTHETIC: Cetacaine Spray  DESCRIPTION OF PROCEDURE: After the risks benefits and alternatives of the procedure were thoroughly explained, informed consent was obtained.  The Pentax Gastroscope [id number] endoscope was introduced through the mouth and advanced to the second portion of the duodenum. Without limitations.  The instrument was slowly withdrawn as the mucosa was fully examined.     Findings:  Normal esophagus; no stricture, ring, or mass seen. Evidence of what appears to be an older gastric stapling procedure with some luminal narrowing in the body (but is more patent than I would expect).  Otherwise normal stomach and pylorus.  Some sort of anastomosis in the proximal duodenum, with visible staple seen, with slight ulceration and erythema around the staple.  Previously seen duodenal ulcer has completely healed.  The scope was then withdrawn from the patient and the procedure completed.  ENDOSCOPIC IMPRESSION:     As above.  No source of dysphagia or abdominal pain seen.   Previously seen duodenal ulcer has healed.   RECOMMENDATIONS:     1.  Watch for potential complications of procedure. 2.  Continue pantoprazole, but can decrease to 40 mg once-a-day. 3.  Follow-up with [facility] in 6-8 weeks.  eSigned:  Willis Modena, MD 01/31/2013 9:02 AM   CC:

## 2013-01-31 NOTE — Anesthesia Preprocedure Evaluation (Addendum)
Anesthesia Evaluation  Patient identified by MRN, date of birth, ID band Patient awake    Reviewed: Allergy & Precautions, H&P , NPO status , Patient's Chart, lab work & pertinent test results  Airway Mallampati: II TM Distance: >3 FB Neck ROM: Full    Dental no notable dental hx.    Pulmonary neg pulmonary ROS, shortness of breath, asthma , sleep apnea ,  breath sounds clear to auscultation  Pulmonary exam normal       Cardiovascular hypertension, Pt. on medications and Pt. on home beta blockers + dysrhythmias Rhythm:Regular Rate:Normal     Neuro/Psych Anxiety Depression negative neurological ROS     GI/Hepatic Neg liver ROS, GERD-  Medicated,Gastric by-pass 2004   Endo/Other  negative endocrine ROSdiabetes  Renal/GU negative Renal ROS  negative genitourinary   Musculoskeletal negative musculoskeletal ROS (+) Fibromyalgia -  Abdominal   Peds  Hematology negative hematology ROS (+)   Anesthesia Other Findings   Reproductive/Obstetrics negative OB ROS                           Anesthesia Physical Anesthesia Plan  ASA: II  Anesthesia Plan: MAC   Post-op Pain Management:    Induction: Intravenous  Airway Management Planned: Nasal Cannula  Additional Equipment:   Intra-op Plan:   Post-operative Plan:   Informed Consent: I have reviewed the patients History and Physical, chart, labs and discussed the procedure including the risks, benefits and alternatives for the proposed anesthesia with the patient or authorized representative who has indicated his/her understanding and acceptance.   Dental advisory given  Plan Discussed with: CRNA  Anesthesia Plan Comments:         Anesthesia Quick Evaluation

## 2013-01-31 NOTE — Anesthesia Postprocedure Evaluation (Signed)
Anesthesia Post Note  Patient: [name]  Procedure(s) Performed: Procedure(s) (LRB): ESOPHAGOGASTRODUODENOSCOPY (EGD) WITH PROPOFOL (N/A) BALLOON DILATION (N/A)  Anesthesia type: MAC  Patient location: PACU  Post pain: Pain level controlled  Post assessment: Post-op Vital signs reviewed  Last Vitals:  Filed Vitals:   01/31/13 0902  BP: 136/97  Pulse: 65  Temp:   Resp: 20    Post vital signs: Reviewed  Level of consciousness: sedated  Complications: No apparent anesthesia complications

## 2013-01-31 NOTE — H&P (Signed)
Patient interval history reviewed.  Patient examined again.  There has been no change from documented H/P dated 01/30/13 (scanned into chart from our office) except as documented above.  Assessment:  1.  Dysphagia, solid and liquid. 2.  Abdominal pain, persistent. 3.  History duodenal ulcer.  Plan:  1.  Endoscopy with possible esophageal dilatation. 2.  Risks (bleeding, infection, bowel perforation that could require surgery, sedation-related changes in cardiopulmonary systems), benefits (identification and possible treatment of source of symptoms, exclusion of certain causes of symptoms), and alternatives (watchful waiting, radiographic imaging studies, empiric medical treatment) of upper endoscopy with possible esophageal dilatation (EGD +/- DIL) were explained to patient in detail and patient wishes to proceed.

## 2013-01-31 NOTE — Preoperative (Signed)
Beta Blockers   Reason not to administer Beta Blockers:Not Applicable 

## 2013-02-01 ENCOUNTER — Encounter (HOSPITAL_COMMUNITY): Payer: Self-pay | Admitting: Gastroenterology

## 2013-02-22 ENCOUNTER — Other Ambulatory Visit: Payer: Self-pay

## 2013-04-07 ENCOUNTER — Other Ambulatory Visit: Payer: Self-pay | Admitting: Physical Medicine & Rehabilitation

## 2013-05-10 ENCOUNTER — Encounter: Payer: No Typology Code available for payment source | Attending: Physical Medicine & Rehabilitation

## 2013-05-10 ENCOUNTER — Ambulatory Visit (HOSPITAL_BASED_OUTPATIENT_CLINIC_OR_DEPARTMENT_OTHER): Payer: Medicare Other | Admitting: Physical Medicine & Rehabilitation

## 2013-05-10 ENCOUNTER — Encounter: Payer: Self-pay | Admitting: Physical Medicine & Rehabilitation

## 2013-05-10 VITALS — BP 133/83 | HR 58 | Resp 14 | Ht 67.0 in | Wt 222.0 lb

## 2013-05-10 DIAGNOSIS — M545 Low back pain, unspecified: Secondary | ICD-10-CM | POA: Diagnosis not present

## 2013-05-10 DIAGNOSIS — K219 Gastro-esophageal reflux disease without esophagitis: Secondary | ICD-10-CM | POA: Diagnosis not present

## 2013-05-10 DIAGNOSIS — M129 Arthropathy, unspecified: Secondary | ICD-10-CM | POA: Insufficient documentation

## 2013-05-10 DIAGNOSIS — M5412 Radiculopathy, cervical region: Secondary | ICD-10-CM

## 2013-05-10 DIAGNOSIS — Z86718 Personal history of other venous thrombosis and embolism: Secondary | ICD-10-CM | POA: Diagnosis not present

## 2013-05-10 DIAGNOSIS — M542 Cervicalgia: Secondary | ICD-10-CM | POA: Diagnosis present

## 2013-05-10 DIAGNOSIS — G4730 Sleep apnea, unspecified: Secondary | ICD-10-CM | POA: Diagnosis not present

## 2013-05-10 DIAGNOSIS — Z9884 Bariatric surgery status: Secondary | ICD-10-CM | POA: Insufficient documentation

## 2013-05-10 DIAGNOSIS — M47817 Spondylosis without myelopathy or radiculopathy, lumbosacral region: Secondary | ICD-10-CM

## 2013-05-10 DIAGNOSIS — I10 Essential (primary) hypertension: Secondary | ICD-10-CM | POA: Insufficient documentation

## 2013-05-10 DIAGNOSIS — M5010 Cervical disc disorder with radiculopathy, unspecified cervical region: Secondary | ICD-10-CM

## 2013-05-10 DIAGNOSIS — M4310 Spondylolisthesis, site unspecified: Secondary | ICD-10-CM

## 2013-05-10 DIAGNOSIS — M25519 Pain in unspecified shoulder: Secondary | ICD-10-CM | POA: Diagnosis not present

## 2013-05-10 DIAGNOSIS — Q762 Congenital spondylolisthesis: Secondary | ICD-10-CM | POA: Insufficient documentation

## 2013-05-10 MED ORDER — TRAMADOL HCL 50 MG PO TABS: 50.0000 mg | ORAL_TABLET | Freq: Four times a day (QID) | ORAL | Status: DC | PRN

## 2013-05-10 MED ORDER — GABAPENTIN 300 MG PO CAPS: 300.0000 mg | ORAL_CAPSULE | Freq: Four times a day (QID) | ORAL | Status: DC
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
## 2013-05-10 NOTE — Patient Instructions (Signed)
Lumbar medial branch block injections for back pain next visit  Resume aquatic therapy as soon as possible  Xray at [place]

## 2013-05-14 ENCOUNTER — Ambulatory Visit
Admission: RE | Admit: 2013-05-14 | Discharge: 2013-05-14 | Disposition: A | Discharge status: Home / Self Care | Payer: Medicare Other | Source: Ambulatory Visit | Attending: Gastroenterology | Admitting: Gastroenterology

## 2013-05-14 ENCOUNTER — Other Ambulatory Visit: Payer: Self-pay | Admitting: Gastroenterology

## 2013-05-14 DIAGNOSIS — K5900 Constipation, unspecified: Secondary | ICD-10-CM

## 2013-05-14 DIAGNOSIS — R109 Unspecified abdominal pain: Secondary | ICD-10-CM

## 2013-05-21 ENCOUNTER — Observation Stay (HOSPITAL_COMMUNITY)
Admission: AD | Admit: 2013-05-21 | Discharge: 2013-05-22 | Disposition: A | Discharge status: Home / Self Care | Payer: Medicare Other | Source: Ambulatory Visit | Attending: Cardiovascular Disease | Admitting: Cardiovascular Disease

## 2013-05-21 DIAGNOSIS — R5383 Other fatigue: Secondary | ICD-10-CM

## 2013-05-21 DIAGNOSIS — Z79899 Other long term (current) drug therapy: Secondary | ICD-10-CM | POA: Insufficient documentation

## 2013-05-21 DIAGNOSIS — F3289 Other specified depressive episodes: Secondary | ICD-10-CM | POA: Insufficient documentation

## 2013-05-21 DIAGNOSIS — M25519 Pain in unspecified shoulder: Principal | ICD-10-CM | POA: Insufficient documentation

## 2013-05-21 DIAGNOSIS — R0789 Other chest pain: Secondary | ICD-10-CM | POA: Diagnosis present

## 2013-05-21 DIAGNOSIS — Z87891 Personal history of nicotine dependence: Secondary | ICD-10-CM | POA: Insufficient documentation

## 2013-05-21 DIAGNOSIS — F411 Generalized anxiety disorder: Secondary | ICD-10-CM | POA: Insufficient documentation

## 2013-05-21 DIAGNOSIS — Z8781 Personal history of (healed) traumatic fracture: Secondary | ICD-10-CM | POA: Insufficient documentation

## 2013-05-21 DIAGNOSIS — I10 Essential (primary) hypertension: Secondary | ICD-10-CM | POA: Insufficient documentation

## 2013-05-21 DIAGNOSIS — D649 Anemia, unspecified: Secondary | ICD-10-CM | POA: Insufficient documentation

## 2013-05-21 DIAGNOSIS — R079 Chest pain, unspecified: Secondary | ICD-10-CM | POA: Diagnosis present

## 2013-05-21 DIAGNOSIS — R5381 Other malaise: Secondary | ICD-10-CM | POA: Insufficient documentation

## 2013-05-21 DIAGNOSIS — R0602 Shortness of breath: Secondary | ICD-10-CM | POA: Insufficient documentation

## 2013-05-21 DIAGNOSIS — IMO0001 Reserved for inherently not codable concepts without codable children: Secondary | ICD-10-CM | POA: Insufficient documentation

## 2013-05-21 DIAGNOSIS — G4730 Sleep apnea, unspecified: Secondary | ICD-10-CM | POA: Insufficient documentation

## 2013-05-21 DIAGNOSIS — R011 Cardiac murmur, unspecified: Secondary | ICD-10-CM | POA: Insufficient documentation

## 2013-05-21 DIAGNOSIS — K219 Gastro-esophageal reflux disease without esophagitis: Secondary | ICD-10-CM | POA: Insufficient documentation

## 2013-05-21 DIAGNOSIS — R6884 Jaw pain: Secondary | ICD-10-CM | POA: Insufficient documentation

## 2013-05-21 DIAGNOSIS — I499 Cardiac arrhythmia, unspecified: Secondary | ICD-10-CM | POA: Insufficient documentation

## 2013-05-21 DIAGNOSIS — F329 Major depressive disorder, single episode, unspecified: Secondary | ICD-10-CM | POA: Insufficient documentation

## 2013-05-21 DIAGNOSIS — Z7982 Long term (current) use of aspirin: Secondary | ICD-10-CM | POA: Insufficient documentation

## 2013-05-21 DIAGNOSIS — Z86718 Personal history of other venous thrombosis and embolism: Secondary | ICD-10-CM | POA: Insufficient documentation

## 2013-05-21 LAB — COMPREHENSIVE METABOLIC PANEL
ALK PHOS: 98 U/L (ref 39–117)
ALT: 22 U/L (ref 0–35)
AST: 23 U/L (ref 0–37)
Albumin: 3.2 g/dL — ABNORMAL LOW (ref 3.5–5.2)
BUN: 20 mg/dL (ref 6–23)
CHLORIDE: 112 meq/L (ref 96–112)
CO2: 18 meq/L — AB (ref 19–32)
CREATININE: 0.78 mg/dL (ref 0.50–1.10)
Calcium: 8.2 mg/dL — ABNORMAL LOW (ref 8.4–10.5)
GFR, EST NON AFRICAN AMERICAN: 90 mL/min — AB (ref >90)
GLUCOSE: 128 mg/dL — AB (ref 70–99)
Potassium: 4.3 mEq/L (ref 3.7–5.3)
Sodium: 143 mEq/L (ref 137–147)
Total Protein: 6.3 g/dL (ref 6.0–8.3)

## 2013-05-21 LAB — CBC WITH DIFFERENTIAL/PLATELET
BASOS PCT: 0 % (ref 0–1)
Basophils Absolute: 0 10*3/uL (ref 0.0–0.1)
Eosinophils Absolute: 0.1 10*3/uL (ref 0.0–0.7)
Eosinophils Relative: 2 % (ref 0–5)
HEMATOCRIT: 33.7 % — AB (ref 36.0–46.0)
HEMOGLOBIN: 10.9 g/dL — AB (ref 12.0–15.0)
Lymphocytes Relative: 50 % — ABNORMAL HIGH (ref 12–46)
Lymphs Abs: 2.7 10*3/uL (ref 0.7–4.0)
MCH: 27.7 pg (ref 26.0–34.0)
MCHC: 32.3 g/dL (ref 30.0–36.0)
MCV: 85.8 fL (ref 78.0–100.0)
MONO ABS: 0.5 10*3/uL (ref 0.1–1.0)
MONOS PCT: 9 % (ref 3–12)
NEUTROS ABS: 2.1 10*3/uL (ref 1.7–7.7)
NEUTROS PCT: 38 % — AB (ref 43–77)
Platelets: 205 10*3/uL (ref 150–400)
RBC: 3.93 MIL/uL (ref 3.87–5.11)
RDW: 16.2 % — ABNORMAL HIGH (ref 11.5–15.5)
WBC: 5.5 10*3/uL (ref 4.0–10.5)

## 2013-05-21 LAB — PROTIME-INR
INR: 1.09 (ref 0.00–1.49)
Prothrombin Time: 13.9 seconds (ref 11.6–15.2)

## 2013-05-21 LAB — TROPONIN I

## 2013-05-21 MED ORDER — HYDROCHLOROTHIAZIDE 12.5 MG PO CAPS
12.5000 mg | ORAL_CAPSULE | Freq: Every day | ORAL | Status: DC
  Administered 2013-05-21: 12.5 mg via ORAL
  Filled 2013-05-21 (×2): qty 1

## 2013-05-21 MED ORDER — HEPARIN BOLUS VIA INFUSION
4000.0000 [IU] | Freq: Once | INTRAVENOUS | Status: AC
  Administered 2013-05-21: 4000 [IU] via INTRAVENOUS
  Filled 2013-05-21: qty 4000

## 2013-05-21 MED ORDER — CITALOPRAM HYDROBROMIDE 20 MG PO TABS
20.0000 mg | ORAL_TABLET | Freq: Every morning | ORAL | Status: DC
  Filled 2013-05-21: qty 1

## 2013-05-21 MED ORDER — METOCLOPRAMIDE HCL 5 MG PO TABS
5.0000 mg | ORAL_TABLET | Freq: Every day | ORAL | Status: DC
  Administered 2013-05-21: 5 mg via ORAL
  Filled 2013-05-21 (×2): qty 1

## 2013-05-21 MED ORDER — ACETAMINOPHEN 325 MG PO TABS
650.0000 mg | ORAL_TABLET | ORAL | Status: DC | PRN
  Administered 2013-05-22: 650 mg via ORAL
  Filled 2013-05-21: qty 2

## 2013-05-21 MED ORDER — FERROUS SULFATE 325 (65 FE) MG PO TABS
325.0000 mg | ORAL_TABLET | Freq: Every day | ORAL | Status: DC
  Administered 2013-05-21: 325 mg via ORAL
  Filled 2013-05-21 (×2): qty 1

## 2013-05-21 MED ORDER — HEPARIN (PORCINE) IN NACL 100-0.45 UNIT/ML-% IJ SOLN
1400.0000 [IU]/h | INTRAMUSCULAR | Status: DC
  Administered 2013-05-21: 1300 [IU]/h via INTRAVENOUS
  Administered 2013-05-22: 1400 [IU]/h via INTRAVENOUS
  Filled 2013-05-21 (×2): qty 250

## 2013-05-21 MED ORDER — LISINOPRIL 10 MG PO TABS
10.0000 mg | ORAL_TABLET | Freq: Every day | ORAL | Status: DC
  Administered 2013-05-21: 10 mg via ORAL
  Filled 2013-05-21 (×2): qty 1

## 2013-05-21 MED ORDER — ONDANSETRON HCL 4 MG/2ML IJ SOLN
4.0000 mg | Freq: Four times a day (QID) | INTRAMUSCULAR | Status: DC | PRN
  Administered 2013-05-22: 4 mg via INTRAVENOUS

## 2013-05-21 MED ORDER — NITROGLYCERIN 0.4 MG SL SUBL: 0.4000 mg | SUBLINGUAL_TABLET | SUBLINGUAL | Status: DC | PRN

## 2013-05-21 MED ORDER — SODIUM CHLORIDE 0.9 % IV SOLN
250.0000 mL | INTRAVENOUS | Status: DC | PRN
Start: 2013-05-21 — End: 2013-05-22

## 2013-05-21 MED ORDER — SODIUM CHLORIDE 0.9 % IJ SOLN: 3.0000 mL | INTRAMUSCULAR | Status: DC | PRN

## 2013-05-21 MED ORDER — ASPIRIN 81 MG PO CHEW
324.0000 mg | CHEWABLE_TABLET | ORAL | Status: AC
  Administered 2013-05-21: 324 mg via ORAL
  Filled 2013-05-21: qty 4

## 2013-05-21 MED ORDER — PANTOPRAZOLE SODIUM 40 MG PO TBEC
40.0000 mg | DELAYED_RELEASE_TABLET | Freq: Every day | ORAL | Status: DC
  Administered 2013-05-21: 40 mg via ORAL
  Filled 2013-05-21: qty 1

## 2013-05-21 MED ORDER — SODIUM CHLORIDE 0.9 % IJ SOLN
3.0000 mL | Freq: Two times a day (BID) | INTRAMUSCULAR | Status: DC
  Administered 2013-05-21: 3 mL via INTRAVENOUS

## 2013-05-21 MED ORDER — METOPROLOL TARTRATE 12.5 MG HALF TABLET
12.5000 mg | ORAL_TABLET | Freq: Two times a day (BID) | ORAL | Status: DC
  Administered 2013-05-21: 12.5 mg via ORAL
  Filled 2013-05-21 (×3): qty 1

## 2013-05-21 MED ORDER — POTASSIUM CHLORIDE CRYS ER 20 MEQ PO TBCR
20.0000 meq | EXTENDED_RELEASE_TABLET | Freq: Two times a day (BID) | ORAL | Status: DC
  Administered 2013-05-21: 20 meq via ORAL
  Filled 2013-05-21 (×2): qty 1

## 2013-05-21 MED ORDER — PROMETHAZINE HCL 25 MG PO TABS: 25.0000 mg | ORAL_TABLET | Freq: Four times a day (QID) | ORAL | Status: DC | PRN

## 2013-05-21 MED ORDER — GABAPENTIN 300 MG PO CAPS
300.0000 mg | ORAL_CAPSULE | Freq: Four times a day (QID) | ORAL | Status: DC
  Administered 2013-05-21: 300 mg via ORAL
  Filled 2013-05-21 (×5): qty 1

## 2013-05-21 MED ORDER — ASPIRIN 300 MG RE SUPP: 300.0000 mg | RECTAL | Status: AC

## 2013-05-21 MED ORDER — LISINOPRIL-HYDROCHLOROTHIAZIDE 10-12.5 MG PO TABS: 1.0000 | ORAL_TABLET | Freq: Every morning | ORAL | Status: DC

## 2013-05-21 MED ORDER — TRAMADOL HCL 50 MG PO TABS: 50.0000 mg | ORAL_TABLET | Freq: Four times a day (QID) | ORAL | Status: DC | PRN

## 2013-05-21 MED ORDER — ASPIRIN EC 81 MG PO TBEC
81.0000 mg | DELAYED_RELEASE_TABLET | Freq: Every day | ORAL | Status: DC
  Filled 2013-05-21: qty 1

## 2013-05-21 NOTE — Progress Notes (Signed)
ANTICOAGULATION CONSULT NOTE - Initial Consult  Pharmacy Consult for Heparin Indication: chest pain/ACS  Allergies  Allergen Reactions  . Sulfonamide Derivatives Rash    REACTION: Syncope    Patient Measurements: Height: 5\' 7"  (170.2 cm) Weight: 222 lb (100.699 kg) IBW/kg (Calculated) : 61.6 Heparin Dosing Weight: 90kg  Vital Signs:    Labs: No results found for this basename: HGB, HCT, PLT, APTT, LABPROT, INR, HEPARINUNFRC, CREATININE, CKTOTAL, CKMB, TROPONINI,  in the last 72 hours  The CrCl is unknown because both a height and weight (above a minimum accepted value) are required for this calculation.   Medical History: Past Medical History  Diagnosis Date  . Hypertension   . Depression   . Anxiety   . GERD (gastroesophageal reflux disease)   . Anemia   . Fibromyalgia   . Dysrhythmia     "heart tends to flutter"  . Sleep apnea     no cpap used in many yrs after weight lost-no machine now  . Heart murmur   . Fracture of left foot 01-17-13    "foot in boot" bone fracture top of foot/ toe  . DVT (deep venous thrombosis) 01-17-13    past hx. -tx.5-6 yrs ago bilateral legs, occ. sporadic swelling, has IVC filter implanted  . Complication of anesthesia yrs ago    sob, bad cough got oxygen after hernia repair, no recent problems    Medications:  Prescriptions prior to admission  Medication Sig Dispense Refill  . aspirin EC 81 MG tablet Take 81 mg by mouth daily.      Marland Kitchen BLACK COHOSH PO Take 1 tablet by mouth daily.      . citalopram (CELEXA) 20 MG tablet Take 20 mg by mouth every morning.       . diclofenac sodium (VOLTAREN) 1 % GEL Apply 2 g topically 4 (four) times daily as needed (for joint pain).      . ferrous sulfate 325 (65 FE) MG tablet Take 325 mg by mouth daily.       . furosemide (LASIX) 40 MG tablet Take 40 mg by mouth daily as needed for edema.       . gabapentin (NEURONTIN) 300 MG capsule Take 1 capsule (300 mg total) by mouth 4 (four) times daily.  120  capsule  3  . lisinopril-hydrochlorothiazide (PRINZIDE,ZESTORETIC) 10-12.5 MG per tablet Take 1 tablet by mouth every morning.       Marland Kitchen MELATONIN PO Take 1 tablet by mouth daily.      . metoCLOPramide (REGLAN) 5 MG tablet Take 5 mg by mouth at bedtime.       . metoprolol tartrate (LOPRESSOR) 25 MG tablet Take 12.5 mg by mouth 2 (two) times daily. Takes 1/2 tablet      . Multiple Vitamins-Minerals (MULTIVITAMINS THER. W/MINERALS) TABS Take 1 tablet by mouth daily.       . nitroGLYCERIN (NITROSTAT) 0.4 MG SL tablet Place 0.4 mg under the tongue every 5 (five) minutes as needed for chest pain.       . pantoprazole (PROTONIX) 40 MG tablet Take 1 tablet (40 mg total) by mouth daily.      . potassium chloride SA (K-DUR,KLOR-CON) 20 MEQ tablet Take 20 mEq by mouth 2 (two) times daily.       . promethazine (PHENERGAN) 25 MG tablet Take 25 mg by mouth every 6 (six) hours as needed for nausea.      . traMADol (ULTRAM) 50 MG tablet Take 50 mg by mouth every  6 (six) hours as needed. Must las 30 days      . vitamin C (ASCORBIC ACID) 500 MG tablet Take 500 mg by mouth daily.      Marland Kitchen VITAMIN D, CHOLECALCIFEROL, PO Take 1,200 Units by mouth daily.      . [DISCONTINUED] traMADol (ULTRAM) 50 MG tablet Take 1 tablet (50 mg total) by mouth every 6 (six) hours as needed. Must las 30 days  120 tablet  2    Assessment: 58 yo F admitted 05/21/2013 with chestpain.  Pharmacy consulted to dose heparin.  PMH: DVT (only on ASA PTA)  Coag: ACS,  Baseline labs pending. Pt denies recent bleeding or surgery.    Goal of Therapy:  Heparin level 0.3-0.7 units/ml Monitor platelets by anticoagulation protocol: Yes   Plan:  1. Heparin 4000 units iv x1 then 1300 units/hr 2. Check heparin level 6h after ggt started 3. Daily heparin level and CBC    Thank you for allowing pharmacy to be a part of this patients care team.  Rowe Robert Pharm.D., BCPS Clinical Pharmacist 05/21/2013 7:51 PM Pager: [phone] Phone: [phone]

## 2013-05-21 NOTE — H&P (Signed)
[name] is an 58 y.o. female.   Chief Complaint: Chest pain, Left arm pain HPI: 58 year old female with 3 weeks history of weakness and shortness of breath along with Jaw pain, Left shoulder and arm pain. She had normal coronaries on cardiac cath on 09/17/2010.  Past Medical History  Diagnosis Date  . Hypertension   . Depression   . Anxiety   . GERD (gastroesophageal reflux disease)   . Anemia   . Fibromyalgia   . Dysrhythmia     "heart tends to flutter"  . Sleep apnea     no cpap used in many yrs after weight lost-no machine now  . Heart murmur   . Fracture of left foot 01-17-13    "foot in boot" bone fracture top of foot/ toe  . DVT (deep venous thrombosis) 01-17-13    past hx. -tx.5-6 yrs ago bilateral legs, occ. sporadic swelling, has IVC filter implanted  . Complication of anesthesia yrs ago    sob, bad cough got oxygen after hernia repair, no recent problems      Past Surgical History  Procedure Laterality Date  . Gastric by-pass  20004  . Esophagogastroduodenoscopy (egd) with propofol  03/01/2012    Procedure: ESOPHAGOGASTRODUODENOSCOPY (EGD) WITH PROPOFOL;  Surgeon: Arta Silence, MD;  Location: [facility];  Service: Endoscopy;  Laterality: N/A;  . Tonsillectomy      as child  . Dilation and curettage of uterus  yrs ago  . Hernia repair  2005  . Cholecystectomy  2007  . Ligament repair Right 1987    Rt. knee scope  . Insertion of vena cava filter  01-17-13    inserted 2004- "abdomen"  . Esophagogastroduodenoscopy (egd) with propofol N/A 01/31/2013    Procedure: ESOPHAGOGASTRODUODENOSCOPY (EGD) WITH PROPOFOL;  Surgeon: Arta Silence, MD;  Location: [facility];  Service: Endoscopy;  Laterality: N/A;  . Balloon dilation N/A 01/31/2013    Procedure: BALLOON DILATION;  Surgeon: Arta Silence, MD;  Location: [facility];  Service: Endoscopy;  Laterality: N/A;    Family History  Problem Relation Age of Onset  . Diabetes Mother   . Heart disease Father    . Hypertension Father    Social History:  reports that she quit smoking about 4 years ago. Her smoking use included Cigarettes. She has a 5 pack-year smoking history. She has never used smokeless tobacco. She reports that she drinks alcohol. She reports that she does not use illicit drugs.  Allergies:  Allergies  Allergen Reactions  . Sulfonamide Derivatives Rash    REACTION: Syncope    Medications Prior to Admission  Medication Sig Dispense Refill  . aspirin EC 81 MG tablet Take 81 mg by mouth daily.      . citalopram (CELEXA) 20 MG tablet Take 20 mg by mouth every morning.       . diclofenac sodium (VOLTAREN) 1 % GEL Apply 2 g topically 4 (four) times daily as needed (for joint pain).      . ferrous sulfate 325 (65 FE) MG tablet Take 325 mg by mouth 2 (two) times daily.       . furosemide (LASIX) 40 MG tablet Take 40 mg by mouth daily as needed for edema.       . gabapentin (NEURONTIN) 300 MG capsule Take 1 capsule (300 mg total) by mouth 4 (four) times daily.  120 capsule  3  . lisinopril-hydrochlorothiazide (PRINZIDE,ZESTORETIC) 10-12.5 MG per tablet Take 1 tablet by mouth every morning.       Marland Kitchen  metoCLOPramide (REGLAN) 5 MG tablet Take 5 mg by mouth 4 (four) times daily.      . metoprolol tartrate (LOPRESSOR) 25 MG tablet Take 12.5 mg by mouth 2 (two) times daily. Takes 1/2 tablet      . Multiple Vitamins-Minerals (MULTIVITAMINS THER. W/MINERALS) TABS Take 1 tablet by mouth daily.       . nitroGLYCERIN (NITROSTAT) 0.4 MG SL tablet Place 0.4 mg under the tongue every 5 (five) minutes as needed for chest pain.       . pantoprazole (PROTONIX) 40 MG tablet Take 1 tablet (40 mg total) by mouth daily.      . potassium chloride SA (K-DUR,KLOR-CON) 20 MEQ tablet Take 20 mEq by mouth 2 (two) times daily.       . promethazine (PHENERGAN) 25 MG tablet Take 25 mg by mouth every 6 (six) hours as needed for nausea.      . traMADol (ULTRAM) 50 MG tablet Take 1 tablet (50 mg total) by mouth every 6  (six) hours as needed. Must las 30 days  120 tablet  2  . vitamin C (ASCORBIC ACID) 500 MG tablet Take 500 mg by mouth daily.        No results found for this or any previous visit (from the past 48 hour(s)). No results found.  ROS Positive for nausea. Negative for vomiting.  Negative for fever. Positive for chills. Positive for headaches  Negative for wheezing. Negative for constipation. Negative  diarrhea. Negative dysuria. Positive for urinary incontinence.  Positive for muscle weakness, which is chronic in all extremities.  There were no vitals taken for this visit.  GENERAL: No apparent distress. Well built and nourished. HEENT: Normocephalic, atraumatic. Extraocular motility intact. Pupils are equal, round and reactive to light and accommodation. Moist mucous membrane. No neck masses.  NECK: Supple. No thyromegaly. No JVD.  CHEST: No palpable tenderness.  CARDIOVASCULAR: Regular rate and rhythm. No murmurs, rubs, or gallops. S1, S2 soft.  RESPIRATORY: Clear to auscultation bilaterally. No wheezing, rales, or rhonchi.  ABDOMEN: Obese. Positive bowel sounds, nontender, nondistended.  BACK: Tenderness to deep palpation in mid back.  EXTREMITIES: No clubbing, cyanosis, or edema. +2 pulses.  NEUROLOGIC: Strength is 4/5 in upper and lower extremities, otherwise nonfocal.  SKIN: Skin is dry and no rashes.  Assessment/Plan Chest Pain/Neck pain/Jaw pain Hypertension Depression Chronic pain syndrome Obesity  R/O MI Nuclear stress test in AM  [facility] 05/21/2013, 7:29 PM

## 2013-05-22 ENCOUNTER — Observation Stay (HOSPITAL_COMMUNITY): Payer: Medicare Other

## 2013-05-22 ENCOUNTER — Encounter (HOSPITAL_COMMUNITY): Payer: Self-pay | Admitting: General Practice

## 2013-05-22 LAB — CBC
HCT: 32.7 % — ABNORMAL LOW (ref 36.0–46.0)
HCT: 31.4 % — ABNORMAL LOW (ref 36.0–46.0)
HEMOGLOBIN: 10.3 g/dL — AB (ref 12.0–15.0)
Hemoglobin: 10.6 g/dL — ABNORMAL LOW (ref 12.0–15.0)
MCH: 27.8 pg (ref 26.0–34.0)
MCH: 28.5 pg (ref 26.0–34.0)
MCHC: 32.4 g/dL (ref 30.0–36.0)
MCHC: 32.8 g/dL (ref 30.0–36.0)
MCV: 85.8 fL (ref 78.0–100.0)
MCV: 86.7 fL (ref 78.0–100.0)
PLATELETS: 202 10*3/uL (ref 150–400)
Platelets: 193 10*3/uL (ref 150–400)
RBC: 3.81 MIL/uL — AB (ref 3.87–5.11)
RBC: 3.62 MIL/uL — AB (ref 3.87–5.11)
RDW: 16.3 % — AB (ref 11.5–15.5)
RDW: 16.4 % — ABNORMAL HIGH (ref 11.5–15.5)
WBC: 6.4 10*3/uL (ref 4.0–10.5)
WBC: 5 10*3/uL (ref 4.0–10.5)

## 2013-05-22 LAB — HEPARIN LEVEL (UNFRACTIONATED)
HEPARIN UNFRACTIONATED: 0.43 [IU]/mL (ref 0.30–0.70)
HEPARIN UNFRACTIONATED: 0.21 [IU]/mL — AB (ref 0.30–0.70)
Heparin Unfractionated: 0.27 IU/mL — ABNORMAL LOW (ref 0.30–0.70)

## 2013-05-22 LAB — BASIC METABOLIC PANEL
BUN: 20 mg/dL (ref 6–23)
CHLORIDE: 114 meq/L — AB (ref 96–112)
CO2: 19 meq/L (ref 19–32)
Calcium: 8.3 mg/dL — ABNORMAL LOW (ref 8.4–10.5)
Creatinine, Ser: 0.88 mg/dL (ref 0.50–1.10)
GFR calc Af Amer: 82 mL/min — ABNORMAL LOW (ref >90)
GFR calc non Af Amer: 71 mL/min — ABNORMAL LOW (ref >90)
Glucose, Bld: 89 mg/dL (ref 70–99)
POTASSIUM: 4.5 meq/L (ref 3.7–5.3)
SODIUM: 145 meq/L (ref 137–147)

## 2013-05-22 LAB — LIPID PANEL
Cholesterol: 126 mg/dL (ref 0–200)
HDL: 68 mg/dL (ref >39)
LDL CALC: 51 mg/dL (ref 0–99)
TRIGLYCERIDES: 37 mg/dL (ref <150)
Total CHOL/HDL Ratio: 1.9 RATIO
VLDL: 7 mg/dL (ref 0–40)

## 2013-05-22 LAB — PROTIME-INR
INR: 1.16 (ref 0.00–1.49)
PROTHROMBIN TIME: 14.6 s (ref 11.6–15.2)

## 2013-05-22 LAB — TROPONIN I
Troponin I: <0.3 ng/mL (ref <0.30)
Troponin I: <0.3 ng/mL (ref <0.30)

## 2013-05-22 MED ORDER — REGADENOSON 0.4 MG/5ML IV SOLN
0.4000 mg | Freq: Once | INTRAVENOUS | Status: AC
  Administered 2013-05-22: 0.4 mg via INTRAVENOUS
  Filled 2013-05-22: qty 5

## 2013-05-22 MED ORDER — TECHNETIUM TC 99M SESTAMIBI GENERIC - CARDIOLITE
10.0000 | Freq: Once | INTRAVENOUS | Status: AC | PRN
  Administered 2013-05-22: 10 via INTRAVENOUS

## 2013-05-22 MED ORDER — ONDANSETRON HCL 4 MG/2ML IJ SOLN
INTRAMUSCULAR | Status: AC
  Administered 2013-05-22: 4 mg via INTRAVENOUS
  Filled 2013-05-22: qty 2

## 2013-05-22 MED ORDER — TECHNETIUM TC 99M SESTAMIBI - CARDIOLITE
30.0000 | Freq: Once | INTRAVENOUS | Status: AC | PRN
  Administered 2013-05-22: 30 via INTRAVENOUS

## 2013-05-22 MED ORDER — REGADENOSON 0.4 MG/5ML IV SOLN
INTRAVENOUS | Status: AC
  Administered 2013-05-22: 0.4 mg via INTRAVENOUS
  Filled 2013-05-22: qty 5

## 2013-05-22 NOTE — Progress Notes (Signed)
UR completed 

## 2013-05-22 NOTE — Progress Notes (Signed)
ANTICOAGULATION CONSULT NOTE - Follow Up Consult  Pharmacy Consult for heparin Indication: chest pain/ACS  Labs:  Recent Labs  05/21/13 2005 05/22/13 0100 05/22/13 0246  HGB 10.9* 10.6* 10.3*  HCT 33.7* 32.7* 31.4*  PLT 205 202 193  LABPROT 13.9 14.6  --   INR 1.09 1.16  --   HEPARINUNFRC  --   --  0.27*  CREATININE 0.78 0.88  --   TROPONINI <0.30 <0.30  --     Assessment: 58yo female slightly subtherapeutic on heparin with initial dosing for CP (gtt started a little late).  Goal of Therapy:  Heparin level 0.3-0.7 units/ml   Plan:  Will increase heparin gtt by 1 unit/kg/hr to 1400 units/hr and check level in 6hr.  Wynona Neat, PharmD, BCPS  05/22/2013,4:45 AM

## 2013-05-22 NOTE — Progress Notes (Signed)
ANTICOAGULATION CONSULT NOTE - Follow Up Consult  Pharmacy Consult for heparin Indication: chest pain/ACS  Labs:  Recent Labs  05/21/13 2005 05/22/13 0100 05/22/13 0246 05/22/13 0740 05/22/13 1320  HGB 10.9* 10.6* 10.3*  --   --   HCT 33.7* 32.7* 31.4*  --   --   PLT 205 202 193  --   --   LABPROT 13.9 14.6  --   --   --   INR 1.09 1.16  --   --   --   HEPARINUNFRC  --   --  0.27*  --  0.43  CREATININE 0.78 0.88  --   --   --   TROPONINI <0.30 <0.30  --  <0.30  --     Assessment: 58yo female therapeutic on heparin for CP  Goal of Therapy:  Heparin level 0.3-0.7 units/ml   Plan:  Continue heparin at 1400 units/hr Check heparin level later today to comfirm   Excell Seltzer, PharmD 05/22/2013,1:48 PM

## 2013-05-22 NOTE — Progress Notes (Signed)
Discharge instructions given.  No questions asked.  Left via wheelchair with family member. Perkia, Jemmie Rhinehart Jean  

## 2013-05-22 NOTE — Discharge Summary (Signed)
Physician Discharge Summary  Patient ID: [name] MRN: [id number] DOB/AGE: 1955/11/23 58 y.o.  Admit date: 05/21/2013 Discharge date: 05/22/2013  Admission Diagnoses: Chest Pain/Neck pain/Jaw pain  Hypertension  Depression  Chronic pain syndrome  Obesity  Discharge Diagnoses:  Principle Problem: * Atypical Chest pain, non-cardiac * Anxiety  Hypertension  Depression  Chronic pain syndrome  Obesity Chronic iron deficiency anemia  Discharged Condition: fair  Hospital Course: 58 year old female with 3 weeks history of weakness and shortness of breath along with Jaw pain, Left shoulder and arm pain. She had normal coronaries on cardiac cath on 09/17/2010. Her cardiac enzymes were normal and her nuclear stress test was without reversible ischemia. She was discharged home in stable condition with follow up by me in 1 week.  Consults: None  Significant Diagnostic Studies: labs: Normal WBC and Platelets count. Chronic low Hgb of 10.3. Near normal BMET.  EKG: Sinus bradycardia with limb lead reversal or possible lateral MI.  Nuclear stress test negative for reversible ischemia. EF 66 %.  Treatments: cardiac meds: metoprolol and aspirin.  Discharge Exam: Blood pressure 116/66, pulse 60, temperature 98.4 F (36.9 C), temperature source Oral, resp. rate 18, height 5\' 7"  (1.702 m), weight 100.699 kg (222 lb), SpO2 99.00%. GENERAL: No apparent distress. Well built and nourished.  HEENT: Normocephalic, atraumatic. Extraocular motility intact. Pupils are equal, round and reactive to light and accommodation. Moist mucous membrane. No neck masses.  NECK: Supple. No thyromegaly. No JVD.  CHEST: No palpable tenderness.  CARDIOVASCULAR: Regular rate and rhythm. No murmurs, rubs, or gallops. S1, S2 soft.  RESPIRATORY: Clear to auscultation bilaterally. No wheezing, rales, or rhonchi.  ABDOMEN: Obese. Positive bowel sounds, nontender, nondistended.  BACK: Tenderness to deep palpation in  mid back.  EXTREMITIES: No clubbing, cyanosis, or edema. +2 pulses.  NEUROLOGIC: Strength is 4/5 in upper and lower extremities, otherwise nonfocal.  SKIN: Skin is dry and no rashes.  Disposition: 01-Home or Self Care   Future Appointments Provider Department Dept Phone   06/12/2013 1:00 PM Charlett Blake, MD [phone]   Leave all valuables at home.       Medication List         aspirin EC 81 MG tablet  Take 81 mg by mouth daily.     BLACK COHOSH PO  Take 1 tablet by mouth daily.     citalopram 20 MG tablet  Commonly known as:  CELEXA  Take 20 mg by mouth every morning.     diclofenac sodium 1 % Gel  Commonly known as:  VOLTAREN  Apply 2 g topically 4 (four) times daily as needed (for joint pain).     ferrous sulfate 325 (65 FE) MG tablet  Take 325 mg by mouth daily.     furosemide 40 MG tablet  Commonly known as:  LASIX  Take 40 mg by mouth daily as needed for edema.     gabapentin 300 MG capsule  Commonly known as:  NEURONTIN  Take 1 capsule (300 mg total) by mouth 4 (four) times daily.     lisinopril-hydrochlorothiazide 10-12.5 MG per tablet  Commonly known as:  PRINZIDE,ZESTORETIC  Take 1 tablet by mouth every morning.     MELATONIN PO  Take 1 tablet by mouth daily.     metoCLOPramide 5 MG tablet  Commonly known as:  REGLAN  Take 5 mg by mouth at bedtime.     metoprolol tartrate 25 MG tablet  Commonly known as:  LOPRESSOR  Take 12.5 mg by mouth 2 (two) times daily. Takes 1/2 tablet     multivitamins ther. w/minerals Tabs tablet  Take 1 tablet by mouth daily.     nitroGLYCERIN 0.4 MG SL tablet  Commonly known as:  NITROSTAT  Place 0.4 mg under the tongue every 5 (five) minutes as needed for chest pain.     pantoprazole 40 MG tablet  Commonly known as:  PROTONIX  Take 1 tablet (40 mg total) by mouth daily.     potassium chloride SA 20 MEQ tablet  Commonly known as:  K-DUR,KLOR-CON  Take 20 mEq by mouth 2  (two) times daily.     promethazine 25 MG tablet  Commonly known as:  PHENERGAN  Take 25 mg by mouth every 6 (six) hours as needed for nausea.     traMADol 50 MG tablet  Commonly known as:  ULTRAM  Take 50 mg by mouth every 6 (six) hours as needed. Must las 30 days     vitamin C 500 MG tablet  Commonly known as:  ASCORBIC ACID  Take 500 mg by mouth daily.     VITAMIN D (CHOLECALCIFEROL) PO  Take 1,200 Units by mouth daily.           Follow-up Information   Follow up with [facility] S, MD. Schedule an appointment as soon as possible for a visit in 1 week.   Specialty:  Cardiology   Contact information:   [place] [address] [phone]       Signed: Birdie Riddle 05/22/2013, 6:00 PM

## 2013-06-04 ENCOUNTER — Inpatient Hospital Stay: Admission: RE | Admit: 2013-06-04 | Payer: Medicare Other | Source: Ambulatory Visit

## 2013-06-12 ENCOUNTER — Other Ambulatory Visit: Payer: Medicare Other

## 2013-06-12 ENCOUNTER — Inpatient Hospital Stay: Admission: RE | Admit: 2013-06-12 | Payer: Medicare Other | Source: Ambulatory Visit

## 2013-06-12 ENCOUNTER — Ambulatory Visit: Payer: Medicare Other | Admitting: Physical Medicine & Rehabilitation

## 2013-07-03 ENCOUNTER — Ambulatory Visit
Admission: RE | Admit: 2013-07-03 | Discharge: 2013-07-03 | Disposition: A | Discharge status: Home / Self Care | Payer: Medicare Other | Source: Ambulatory Visit | Attending: Physical Medicine & Rehabilitation | Admitting: Physical Medicine & Rehabilitation

## 2013-07-03 DIAGNOSIS — M5010 Cervical disc disorder with radiculopathy, unspecified cervical region: Secondary | ICD-10-CM

## 2013-07-03 DIAGNOSIS — M4310 Spondylolisthesis, site unspecified: Secondary | ICD-10-CM

## 2013-07-03 DIAGNOSIS — M47817 Spondylosis without myelopathy or radiculopathy, lumbosacral region: Secondary | ICD-10-CM

## 2013-07-10 ENCOUNTER — Encounter: Payer: Self-pay | Admitting: Physical Medicine & Rehabilitation

## 2013-07-10 ENCOUNTER — Encounter: Payer: Medicare Other | Attending: Physical Medicine & Rehabilitation

## 2013-07-10 ENCOUNTER — Ambulatory Visit (HOSPITAL_BASED_OUTPATIENT_CLINIC_OR_DEPARTMENT_OTHER): Payer: Medicare Other | Admitting: Physical Medicine & Rehabilitation

## 2013-07-10 VITALS — BP 137/72 | HR 66 | Ht 67.0 in | Wt 222.0 lb

## 2013-07-10 DIAGNOSIS — G4730 Sleep apnea, unspecified: Secondary | ICD-10-CM | POA: Insufficient documentation

## 2013-07-10 DIAGNOSIS — Q762 Congenital spondylolisthesis: Secondary | ICD-10-CM | POA: Insufficient documentation

## 2013-07-10 DIAGNOSIS — M545 Low back pain, unspecified: Secondary | ICD-10-CM | POA: Insufficient documentation

## 2013-07-10 DIAGNOSIS — M542 Cervicalgia: Secondary | ICD-10-CM | POA: Insufficient documentation

## 2013-07-10 DIAGNOSIS — I10 Essential (primary) hypertension: Secondary | ICD-10-CM | POA: Insufficient documentation

## 2013-07-10 DIAGNOSIS — K219 Gastro-esophageal reflux disease without esophagitis: Secondary | ICD-10-CM | POA: Insufficient documentation

## 2013-07-10 DIAGNOSIS — M25519 Pain in unspecified shoulder: Secondary | ICD-10-CM | POA: Insufficient documentation

## 2013-07-10 DIAGNOSIS — M47817 Spondylosis without myelopathy or radiculopathy, lumbosacral region: Secondary | ICD-10-CM

## 2013-07-10 DIAGNOSIS — Z9884 Bariatric surgery status: Secondary | ICD-10-CM | POA: Insufficient documentation

## 2013-07-10 DIAGNOSIS — Z86718 Personal history of other venous thrombosis and embolism: Secondary | ICD-10-CM | POA: Insufficient documentation

## 2013-07-10 DIAGNOSIS — M129 Arthropathy, unspecified: Secondary | ICD-10-CM | POA: Insufficient documentation

## 2013-07-10 MED ORDER — TRAMADOL HCL 50 MG PO TABS: 50.0000 mg | ORAL_TABLET | Freq: Four times a day (QID) | ORAL | Status: DC | PRN

## 2013-07-10 NOTE — Progress Notes (Signed)
  [facility]   Name: [name] DOB:05-30-1955 MRN: [id number]  Date:07/10/2013  Physician: Alysia Penna, MD    Nurse/CMA: Redgie Grayer  Allergies:  Allergies  Allergen Reactions  . Sulfonamide Derivatives Rash    REACTION: Syncope    Consent Signed: yes  Is patient diabetic? no   Pregnant: no LMP: No LMP recorded. Patient is postmenopausal. (age 60-55)  Anticoagulants: no Anti-inflammatory: no Antibiotics: no  Procedure: Medial branch block L3.4.5  Position: Prone Start Time: 1206  End Time: 1220  Fluoro Time: 42   RN/CMA Levens,CMA Levens,CMA    Time 1133 1225    BP 137/72 144/75    Pulse 66 52    Respirations 14 14    O2 Sat 95 97    S/S 6 6    Pain Level 10/10 4/10     D/C home with Sister-Helen, patient A & O X 3, D/C instructions reviewed, and sits independently.

## 2013-07-10 NOTE — Progress Notes (Signed)
Bilateral Lumbar L3, L4  medial branch blocks and L 5 dorsal ramus injection under fluoroscopic guidance  Indication: Lumbar pain which is not relieved by medication management or other conservative care and interfering with self-care and mobility.  Informed consent was obtained after describing risks and benefits of the procedure with the patient, this includes bleeding, infection, paralysis and medication side effects.  The patient wishes to proceed and has given written consent.  The patient was placed in prone position.  The lumbar area was marked and prepped with Betadine.  One mL of 1% lidocaine was injected into each of 6 areas into the skin and subcutaneous tissue.  Then a 22-gauge 5 inch spinal needle was inserted targeting the junction of the left S1 superior articular process and sacral ala junction. Needle was advanced under fluoroscopic guidance.  Bone contact was made.  Omnipaque 180 was injected x 0.5 mL demonstrating no intravascular uptake.  Then a solution containing one mL of 4 mg per mL dexamethasone and 3 mL of 2% MPF lidocaine was injected x 0.5 mL.  Then the left L5 superior articular process in transverse process junction was targeted.  Bone contact was made.  Omnipaque 180 was injected x 0.5 mL demonstrating no intravascular uptake. Then a solution containing one mL of 4 mg per mL dexamethasone and 3 mL of 2% MPF lidocaine was injected x 0.5 mL.  Then the left L4 superior articular process in transverse process junction was targeted.  Bone contact was made.  Omnipaque 180 was injected x 0.5 mL demonstrating no intravascular uptake.  Then a solution containing one mL of 4 mg per mL dexamethasone and 3 mL if 2% MPF lidocaine was injected x 0.5 mL.  This same procedure was performed on the right side using the same needle, technique and injectate.  Patient tolerated procedure well.  Post procedure instructions were given. 

## 2013-07-10 NOTE — Patient Instructions (Signed)

## 2013-08-01 ENCOUNTER — Other Ambulatory Visit: Payer: Self-pay | Admitting: Obstetrics and Gynecology

## 2013-08-01 ENCOUNTER — Other Ambulatory Visit (HOSPITAL_COMMUNITY)
Admission: RE | Admit: 2013-08-01 | Discharge: 2013-08-01 | Disposition: A | Discharge status: Home / Self Care | Payer: Medicare Other | Source: Ambulatory Visit | Attending: Obstetrics and Gynecology | Admitting: Obstetrics and Gynecology

## 2013-08-01 DIAGNOSIS — Z1151 Encounter for screening for human papillomavirus (HPV): Secondary | ICD-10-CM | POA: Insufficient documentation

## 2013-08-01 DIAGNOSIS — Z124 Encounter for screening for malignant neoplasm of cervix: Secondary | ICD-10-CM | POA: Insufficient documentation

## 2013-08-10 ENCOUNTER — Encounter: Payer: Medicare Other | Attending: Physical Medicine & Rehabilitation

## 2013-08-10 ENCOUNTER — Ambulatory Visit (HOSPITAL_BASED_OUTPATIENT_CLINIC_OR_DEPARTMENT_OTHER): Payer: Medicare Other | Admitting: Physical Medicine & Rehabilitation

## 2013-08-10 ENCOUNTER — Encounter: Payer: Self-pay | Admitting: Physical Medicine & Rehabilitation

## 2013-08-10 VITALS — BP 150/87 | HR 54 | Resp 16 | Ht 67.5 in | Wt 228.0 lb

## 2013-08-10 DIAGNOSIS — M129 Arthropathy, unspecified: Secondary | ICD-10-CM | POA: Insufficient documentation

## 2013-08-10 DIAGNOSIS — I10 Essential (primary) hypertension: Secondary | ICD-10-CM | POA: Insufficient documentation

## 2013-08-10 DIAGNOSIS — M47817 Spondylosis without myelopathy or radiculopathy, lumbosacral region: Secondary | ICD-10-CM

## 2013-08-10 DIAGNOSIS — Q762 Congenital spondylolisthesis: Secondary | ICD-10-CM | POA: Insufficient documentation

## 2013-08-10 DIAGNOSIS — M25519 Pain in unspecified shoulder: Secondary | ICD-10-CM | POA: Insufficient documentation

## 2013-08-10 DIAGNOSIS — M542 Cervicalgia: Secondary | ICD-10-CM | POA: Insufficient documentation

## 2013-08-10 DIAGNOSIS — M545 Low back pain, unspecified: Secondary | ICD-10-CM | POA: Insufficient documentation

## 2013-08-10 DIAGNOSIS — G4730 Sleep apnea, unspecified: Secondary | ICD-10-CM | POA: Insufficient documentation

## 2013-08-10 DIAGNOSIS — Z9884 Bariatric surgery status: Secondary | ICD-10-CM | POA: Insufficient documentation

## 2013-08-10 DIAGNOSIS — Z86718 Personal history of other venous thrombosis and embolism: Secondary | ICD-10-CM | POA: Insufficient documentation

## 2013-08-10 DIAGNOSIS — K219 Gastro-esophageal reflux disease without esophagitis: Secondary | ICD-10-CM | POA: Insufficient documentation

## 2013-08-10 NOTE — Patient Instructions (Signed)
Next injection is a repeat of the previous injection in March and if you get a similar affect we will do radiofrequency procedure

## 2013-08-10 NOTE — Progress Notes (Signed)
Subjective:    Patient ID: [name], female    DOB: Sep 01, 1955, 58 y.o.   MRN: [id number]  HPI 07/10/2013 Bilateral Lumbar L3, L4 medial branch blocks and L 5 dorsal ramus injection under fluoroscopic guidance For 6 days post procedure pain reduced from 8/10-1/10 Then pain returned  Started walking when she was feeling bad  Pain back of knee to hip on RIght side Pain Inventory Average Pain 8 Pain Right Now 9 My pain is sharp, burning and aching  In the last 24 hours, has pain interfered with the following? General activity 9 Relation with others 9 Enjoyment of life 9 What TIME of day is your pain at its worst? day,evening,night time Sleep (in general) NA  Pain is worse with: walking, bending, sitting and standing Pain improves with: rest and medication Relief from Meds: 4  Mobility use a cane how many minutes can you walk? <5 ability to climb steps?  no do you drive?  yes Do you have any goals in this area?  yes  Function disabled: date disabled na I need assistance with the following:  bathing, household duties and shopping Do you have any goals in this area?  yes  Neuro/Psych bladder control problems bowel control problems weakness trouble walking spasms depression  Prior Studies Any changes since last visit?  yes  Physicians involved in your care Any changes since last visit?  no   Family History  Problem Relation Age of Onset  . Diabetes Mother   . Heart disease Father   . Hypertension Father    History   Social History  . Marital Status: Widowed    Spouse Name: N/A    Number of Children: N/A  . Years of Education: N/A   Social History Main Topics  . Smoking status: Former Smoker -- 0.50 packs/day for 10 years    Types: Cigarettes    Quit date: 04/19/2009  . Smokeless tobacco: Never Used  . Alcohol Use: Yes     Comment: occ. social- wine  . Drug Use: No  . Sexual Activity: Not Currently   Other Topics Concern  . None    Social History Narrative  . None   Past Surgical History  Procedure Laterality Date  . Gastric by-pass  20004  . Esophagogastroduodenoscopy (egd) with propofol  03/01/2012    Procedure: ESOPHAGOGASTRODUODENOSCOPY (EGD) WITH PROPOFOL;  Surgeon: Arta Silence, MD;  Location: [facility];  Service: Endoscopy;  Laterality: N/A;  . Tonsillectomy      as child  . Dilation and curettage of uterus  yrs ago  . Hernia repair  2005  . Cholecystectomy  2007  . Ligament repair Right 1987    Rt. knee scope  . Insertion of vena cava filter  01-17-13    inserted 2004- "abdomen"  . Esophagogastroduodenoscopy (egd) with propofol N/A 01/31/2013    Procedure: ESOPHAGOGASTRODUODENOSCOPY (EGD) WITH PROPOFOL;  Surgeon: Arta Silence, MD;  Location: [facility];  Service: Endoscopy;  Laterality: N/A;  . Balloon dilation N/A 01/31/2013    Procedure: BALLOON DILATION;  Surgeon: Arta Silence, MD;  Location: [facility];  Service: Endoscopy;  Laterality: N/A;   Past Medical History  Diagnosis Date  . Hypertension   . Depression   . Anxiety   . GERD (gastroesophageal reflux disease)   . Anemia   . Fibromyalgia   . Dysrhythmia     "heart tends to flutter"  . Sleep apnea     no cpap used in many yrs after weight  lost-no machine now  . Heart murmur   . Fracture of left foot 01-17-13    "foot in boot" bone fracture top of foot/ toe  . DVT (deep venous thrombosis) 01-17-13    past hx. -tx.5-6 yrs ago bilateral legs, occ. sporadic swelling, has IVC filter implanted  . Complication of anesthesia yrs ago    sob, bad cough got oxygen after hernia repair, no recent problems   BP 150/87  Pulse 54  Resp 16  Ht 5' 7.5" (1.715 m)  Wt 228 lb (103.42 kg)  BMI 35.16 kg/m2  SpO2 98%  Opioid Risk Score:   Fall Risk Score:     Review of Systems  Genitourinary:       Bladder control problems   Musculoskeletal: Positive for gait problem.  Neurological: Positive for weakness.       Spasms   Psychiatric/Behavioral: Positive for dysphoric mood.  All other systems reviewed and are negative.      Objective:   Physical Exam   Pain with lumbar extension. No pain with lumbar flexion Negative straight leg raising test Normal deep tendon reflexes at the knees     Assessment & Plan:  1. A lumbar spondylosis with chronic low back pain had excellent relief for 6 days after lumbar medial branch blocks bilateral L3-L4 and L5 dorsal ramus. Will repeat this and if another temporary but greater than 50% relief we will then schedule radio frequency neurotomy first on the right side and then on the left

## 2013-08-27 ENCOUNTER — Other Ambulatory Visit: Payer: Self-pay | Admitting: Physical Medicine & Rehabilitation

## 2013-09-13 ENCOUNTER — Ambulatory Visit: Payer: Medicare Other | Admitting: Physical Medicine & Rehabilitation

## 2013-09-14 ENCOUNTER — Ambulatory Visit: Payer: Medicare Other | Admitting: Physical Medicine & Rehabilitation

## 2013-09-27 ENCOUNTER — Encounter: Payer: Self-pay | Admitting: Physical Medicine & Rehabilitation

## 2013-09-27 ENCOUNTER — Ambulatory Visit (HOSPITAL_BASED_OUTPATIENT_CLINIC_OR_DEPARTMENT_OTHER): Payer: Medicare Other | Admitting: Physical Medicine & Rehabilitation

## 2013-09-27 ENCOUNTER — Encounter: Payer: Medicare Other | Attending: Physical Medicine & Rehabilitation

## 2013-09-27 VITALS — BP 122/57 | HR 112 | Resp 14 | Ht 67.0 in | Wt 227.0 lb

## 2013-09-27 DIAGNOSIS — G4730 Sleep apnea, unspecified: Secondary | ICD-10-CM | POA: Insufficient documentation

## 2013-09-27 DIAGNOSIS — M542 Cervicalgia: Secondary | ICD-10-CM | POA: Insufficient documentation

## 2013-09-27 DIAGNOSIS — I10 Essential (primary) hypertension: Secondary | ICD-10-CM | POA: Insufficient documentation

## 2013-09-27 DIAGNOSIS — Q762 Congenital spondylolisthesis: Secondary | ICD-10-CM | POA: Insufficient documentation

## 2013-09-27 DIAGNOSIS — M545 Low back pain, unspecified: Secondary | ICD-10-CM | POA: Insufficient documentation

## 2013-09-27 DIAGNOSIS — Z9884 Bariatric surgery status: Secondary | ICD-10-CM | POA: Insufficient documentation

## 2013-09-27 DIAGNOSIS — M4310 Spondylolisthesis, site unspecified: Secondary | ICD-10-CM

## 2013-09-27 DIAGNOSIS — Z86718 Personal history of other venous thrombosis and embolism: Secondary | ICD-10-CM | POA: Insufficient documentation

## 2013-09-27 DIAGNOSIS — M5430 Sciatica, unspecified side: Secondary | ICD-10-CM

## 2013-09-27 DIAGNOSIS — M5386 Other specified dorsopathies, lumbar region: Secondary | ICD-10-CM

## 2013-09-27 DIAGNOSIS — M129 Arthropathy, unspecified: Secondary | ICD-10-CM | POA: Insufficient documentation

## 2013-09-27 DIAGNOSIS — M4316 Spondylolisthesis, lumbar region: Secondary | ICD-10-CM

## 2013-09-27 DIAGNOSIS — M25519 Pain in unspecified shoulder: Secondary | ICD-10-CM | POA: Insufficient documentation

## 2013-09-27 DIAGNOSIS — M47817 Spondylosis without myelopathy or radiculopathy, lumbosacral region: Secondary | ICD-10-CM

## 2013-09-27 DIAGNOSIS — K219 Gastro-esophageal reflux disease without esophagitis: Secondary | ICD-10-CM | POA: Insufficient documentation

## 2013-09-27 MED ORDER — GABAPENTIN 300 MG PO CAPS: 300.0000 mg | ORAL_CAPSULE | Freq: Four times a day (QID) | ORAL | Status: DC

## 2013-09-27 NOTE — Progress Notes (Signed)
Subjective:    Patient ID: [name], female    DOB: 12-15-1955, 58 y.o.   MRN: [id number]  HPI  07/10/2013 Bilateral Lumbar L3, L4 medial branch blocks and L 5 dorsal ramus injection under fluoroscopic guidance For 6 days post procedure pain reduced from 8/10-1/10 Then pain returned  Starting to get some symptoms in RLE , pain goes down thigh as well as lateral leg Pain Inventory Average Pain 6 Pain Right Now 8 My pain is burning, stabbing, tingling and aching  In the last 24 hours, has pain interfered with the following? General activity 4 Relation with others 4 Enjoyment of life 4 What TIME of day is your pain at its worst? morning, night Sleep (in general) Poor  Pain is worse with: laying down Pain improves with: heat/ice and therapy/exercise Relief from Meds: 6  Mobility walk with assistance use a cane ability to climb steps?  no do you drive?  yes transfers alone  Function disabled: date disabled na I need assistance with the following:  meal prep, household duties and shopping  Neuro/Psych numbness  Prior Studies Any changes since last visit?  no MRI Lumbar 2013 L4-5:  Diffuse bulging annulus, osteophytic ridging, short pedicles, facet disease and a small left sided synovial cyst contribute to moderate spinal and bilateral lateral recess stenosis.  There is also mild to moderate foraminal stenosis bilaterally.  These findings appear relatively stable.   L5-S1:  Advanced degenerative disc disease with disc space narrowing, osteophytic ridging and bulging degenerated disc.  This in combination with advanced facet disease contributes to mild spinal and bilateral lateral recess stenosis.  No significant foraminal stenosis.  The findings appear stable.   IMPRESSION: Multilevel disc disease and severe facet disease contributing to spinal, lateral recess and foraminal stenosis as discussed above at the individual levels.  L4-5 is the most  significant level.  No significant change since prior study.  Physicians involved in your care Any changes since last visit?  no   Family History  Problem Relation Age of Onset  . Diabetes Mother   . Heart disease Father   . Hypertension Father    History   Social History  . Marital Status: Widowed    Spouse Name: N/A    Number of Children: N/A  . Years of Education: N/A   Social History Main Topics  . Smoking status: Former Smoker -- 0.50 packs/day for 10 years    Types: Cigarettes    Quit date: 04/19/2009  . Smokeless tobacco: Never Used  . Alcohol Use: Yes     Comment: occ. social- wine  . Drug Use: No  . Sexual Activity: Not Currently   Other Topics Concern  . None   Social History Narrative  . None   Past Surgical History  Procedure Laterality Date  . Gastric by-pass  20004  . Esophagogastroduodenoscopy (egd) with propofol  03/01/2012    Procedure: ESOPHAGOGASTRODUODENOSCOPY (EGD) WITH PROPOFOL;  Surgeon: Arta Silence, MD;  Location: [facility];  Service: Endoscopy;  Laterality: N/A;  . Tonsillectomy      as child  . Dilation and curettage of uterus  yrs ago  . Hernia repair  2005  . Cholecystectomy  2007  . Ligament repair Right 1987    Rt. knee scope  . Insertion of vena cava filter  01-17-13    inserted 2004- "abdomen"  . Esophagogastroduodenoscopy (egd) with propofol N/A 01/31/2013    Procedure: ESOPHAGOGASTRODUODENOSCOPY (EGD) WITH PROPOFOL;  Surgeon: Arta Silence, MD;  Location: [facility];  Service: Endoscopy;  Laterality: N/A;  . Balloon dilation N/A 01/31/2013    Procedure: BALLOON DILATION;  Surgeon: Arta Silence, MD;  Location: [facility];  Service: Endoscopy;  Laterality: N/A;   Past Medical History  Diagnosis Date  . Hypertension   . Depression   . Anxiety   . GERD (gastroesophageal reflux disease)   . Anemia   . Fibromyalgia   . Dysrhythmia     "heart tends to flutter"  . Sleep apnea     no cpap used in many yrs after  weight lost-no machine now  . Heart murmur   . Fracture of left foot 01-17-13    "foot in boot" bone fracture top of foot/ toe  . DVT (deep venous thrombosis) 01-17-13    past hx. -tx.5-6 yrs ago bilateral legs, occ. sporadic swelling, has IVC filter implanted  . Complication of anesthesia yrs ago    sob, bad cough got oxygen after hernia repair, no recent problems   BP 122/57  Pulse 112  Resp 14  Ht 5\' 7"  (1.702 m)  Wt 227 lb (102.967 kg)  BMI 35.55 kg/m2  SpO2 100%  Opioid Risk Score:   Fall Risk Score: Moderate Fall Risk (6-13 points) (pt educated on fall risk, brochure given to pt previously)    Review of Systems  Musculoskeletal: Positive for back pain.  Neurological: Positive for numbness.  All other systems reviewed and are negative.      Objective:   Physical Exam  Decreased sensation to pinprick L5 on the right side only normal L3-L4 and S1 bilateral Motor strength 5/5 bilateral hip flexor knee extensor ankle dorsiflexor plantar flexor Gait with forward flexion no evidence of toe drag or knee instability Straight leg raise to 90 without pain Mood and affect are appropriate Back is tenderness palpation L4 and L5 paraspinals Pain with lumbar extension cannot get to neutral. Normal lumbar flexion     Assessment & Plan:  1. Lumbar spondylolisthesis at L4-L5 no instability with flexion extension films. May be developing right lumbar radiculopathy at L5 2. Lumbar spondylosis had good the temporary relief lumbar medial branch blocks. Will repeat them and if similar response would be a good candidate for radiofrequency neurotomy. 3. Cervicogenic headache has evidence of C3-C4 disc degeneration which is likely contributing We discussed the patient's complaints as well as treatment plan. She's been very anxious about her symptomatology.  If the patient has a good response to the radiofrequency but continues to have right lower ext sciatic pain may need repeat MRI Over  half of the 25 min visit was spent counseling and coordinating care.

## 2013-09-27 NOTE — Patient Instructions (Signed)
Next step repeat injection if helpful but temporary will do Radiofrequency neurotomy

## 2013-10-08 LAB — HM MAMMOGRAPHY: HM Mammogram: NORMAL

## 2013-10-26 ENCOUNTER — Encounter: Payer: Medicare Other | Admitting: Registered Nurse

## 2013-11-13 ENCOUNTER — Other Ambulatory Visit: Payer: Self-pay

## 2013-11-13 ENCOUNTER — Encounter: Payer: Self-pay | Admitting: Physical Medicine & Rehabilitation

## 2013-11-13 ENCOUNTER — Encounter: Payer: Medicare Other | Attending: Physical Medicine & Rehabilitation

## 2013-11-13 ENCOUNTER — Ambulatory Visit (HOSPITAL_BASED_OUTPATIENT_CLINIC_OR_DEPARTMENT_OTHER): Payer: Medicare Other | Admitting: Physical Medicine & Rehabilitation

## 2013-11-13 VITALS — BP 130/62 | HR 67 | Resp 14

## 2013-11-13 DIAGNOSIS — M47817 Spondylosis without myelopathy or radiculopathy, lumbosacral region: Secondary | ICD-10-CM

## 2013-11-13 DIAGNOSIS — K219 Gastro-esophageal reflux disease without esophagitis: Secondary | ICD-10-CM | POA: Diagnosis not present

## 2013-11-13 DIAGNOSIS — Z86718 Personal history of other venous thrombosis and embolism: Secondary | ICD-10-CM | POA: Insufficient documentation

## 2013-11-13 DIAGNOSIS — M545 Low back pain, unspecified: Secondary | ICD-10-CM | POA: Insufficient documentation

## 2013-11-13 DIAGNOSIS — Q762 Congenital spondylolisthesis: Secondary | ICD-10-CM | POA: Insufficient documentation

## 2013-11-13 DIAGNOSIS — M129 Arthropathy, unspecified: Secondary | ICD-10-CM | POA: Diagnosis not present

## 2013-11-13 DIAGNOSIS — M25519 Pain in unspecified shoulder: Secondary | ICD-10-CM | POA: Diagnosis not present

## 2013-11-13 DIAGNOSIS — G4730 Sleep apnea, unspecified: Secondary | ICD-10-CM | POA: Diagnosis not present

## 2013-11-13 DIAGNOSIS — Z9884 Bariatric surgery status: Secondary | ICD-10-CM | POA: Insufficient documentation

## 2013-11-13 DIAGNOSIS — M542 Cervicalgia: Secondary | ICD-10-CM | POA: Insufficient documentation

## 2013-11-13 DIAGNOSIS — I10 Essential (primary) hypertension: Secondary | ICD-10-CM | POA: Diagnosis not present

## 2013-11-13 MED ORDER — GABAPENTIN 400 MG PO CAPS: 400.0000 mg | ORAL_CAPSULE | Freq: Four times a day (QID) | ORAL | Status: DC

## 2013-11-13 NOTE — Progress Notes (Signed)
Bilateral Lumbar L3, L4  medial branch blocks and L 5 dorsal ramus injection under fluoroscopic guidance  Indication: Lumbar pain which is not relieved by medication management or other conservative care and interfering with self-care and mobility.  Informed consent was obtained after describing risks and benefits of the procedure with the patient, this includes bleeding, infection, paralysis and medication side effects.  The patient wishes to proceed and has given written consent.  The patient was placed in prone position.  The lumbar area was marked and prepped with Betadine.  One mL of 1% lidocaine was injected into each of 6 areas into the skin and subcutaneous tissue.  Then a 22-gauge 5 inch spinal needle was inserted targeting the junction of the left S1 superior articular process and sacral ala junction. Needle was advanced under fluoroscopic guidance.  Bone contact was made.  Omnipaque 180 was injected x 0.5 mL demonstrating no intravascular uptake.  Then a solution containing one mL of 4 mg per mL dexamethasone and 3 mL of 2% MPF lidocaine was injected x 0.5 mL.  Then the left L5 superior articular process in transverse process junction was targeted.  Bone contact was made.  Omnipaque 180 was injected x 0.5 mL demonstrating no intravascular uptake. Then a solution containing one mL of 4 mg per mL dexamethasone and 3 mL of 2% MPF lidocaine was injected x 0.5 mL.  Then the left L4 superior articular process in transverse process junction was targeted.  Bone contact was made.  Omnipaque 180 was injected x 0.5 mL demonstrating no intravascular uptake.  Then a solution containing one mL of 4 mg per mL dexamethasone and 3 mL if 2% MPF lidocaine was injected x 0.5 mL.  This same procedure was performed on the right side using the same needle, technique and injectate.  Patient tolerated procedure well.  Post procedure instructions were given. 

## 2013-11-13 NOTE — Progress Notes (Signed)
  [facility]   Name: [name] DOB:1956-03-20 MRN: [id number]  Date:11/13/2013  Physician: Alysia Penna, MD    Nurse/CMA: Henryk Ursin CMA  Allergies:  Allergies  Allergen Reactions  . Sulfonamide Derivatives Rash    REACTION: Syncope    Consent Signed: Yes.    Is patient diabetic? No.  CBG today?   Pregnant: No. LMP: No LMP recorded. Patient is postmenopausal. (age 60-55)  Anticoagulants: no Anti-inflammatory: no Antibiotics: no  Procedure: Bilateral Medial Branch Block Position: Prone Start Time: 1:31 End Time: 1:45 Fluoro Time: 56  RN/CMA Ronette Hank CMA Rocko Fesperman CMA    Time 1:06 1:50    BP 130/62 154/91    Pulse 67 51    Respirations 14 14    O2 Sat 99 99    S/S 6 6    Pain Level 7/10 5/10     D/C home with [name], patient A & O X 3, D/C instructions reviewed, and sits independently.

## 2013-11-13 NOTE — Patient Instructions (Signed)
Lumbar medial branch blocks were performed. This is to help diagnose the cause of the low back pain. It is important that you keep track of your pain for the first day or 2 after injection. This injection can give you temporary relief that lasts for hours or up to several months. There is no way to predict duration of pain relief.  Please try to compare your pain after injection to for the injection.  If this injection gives you  temporary relief there may be another longer-lasting procedure that may be beneficial called radiofrequency ablation  If your lower extremity pain does not improve with the increased dose of gabapentin will do epidural injection next visit

## 2013-11-27 ENCOUNTER — Telehealth: Payer: Self-pay | Admitting: Neurology

## 2013-11-27 NOTE — Telephone Encounter (Signed)
Rowena from [facility] calling to schedule an appointment for patient with Dr. Leonie Man due to patient having leg numbness and losing balance, please return call and advise.

## 2013-11-28 NOTE — Telephone Encounter (Signed)
Called and made the patient a appointment with lynn for Friday the 21.

## 2013-11-30 ENCOUNTER — Encounter: Payer: Self-pay | Admitting: Nurse Practitioner

## 2013-12-05 ENCOUNTER — Encounter: Payer: Self-pay | Admitting: Podiatry

## 2013-12-05 ENCOUNTER — Ambulatory Visit (INDEPENDENT_AMBULATORY_CARE_PROVIDER_SITE_OTHER): Payer: Medicare Other

## 2013-12-05 ENCOUNTER — Ambulatory Visit (INDEPENDENT_AMBULATORY_CARE_PROVIDER_SITE_OTHER): Payer: Medicare Other | Admitting: Podiatry

## 2013-12-05 VITALS — Ht 67.0 in | Wt 227.0 lb

## 2013-12-05 DIAGNOSIS — M779 Enthesopathy, unspecified: Secondary | ICD-10-CM

## 2013-12-05 MED ORDER — TRIAMCINOLONE ACETONIDE 10 MG/ML IJ SUSP
10.0000 mg | Freq: Once | INTRAMUSCULAR | Status: AC
  Administered 2013-12-05: 10 mg

## 2013-12-05 NOTE — Progress Notes (Signed)
   Subjective:    Patient ID: [name], female    DOB: 03/22/1956, 58 y.o.   MRN: [id number]  HPI Comments: i have pain in my rt foot on the lateral side. Its been going on for 1.5 months. i went to my orthopedic and they put me in a boot for 2 weeks. It made it worse. It hurts to walk and stand. i have used ice, wrapped in ace wrap and soak in epsom salt.    Foot Pain Associated symptoms include numbness and weakness.      Review of Systems  Cardiovascular: Positive for leg swelling.       Calf pain  Endocrine:       Increase urination  Genitourinary: Positive for frequency.  Musculoskeletal:       Joint pain Back pain Difficulty walking Muscle pain  Neurological: Positive for weakness and numbness.  Hematological: Bruises/bleeds easily.  All other systems reviewed and are negative.      Objective:   Physical Exam        Assessment & Plan:

## 2013-12-06 ENCOUNTER — Telehealth: Payer: Self-pay | Admitting: *Deleted

## 2013-12-06 NOTE — Telephone Encounter (Signed)
Called patient lvm letting her know, her appointment for 12-07-2013 has been cancelled. And to call me back to reschedule her appointment. I will try again tomorrow if the patient has not called back to reschedule.

## 2013-12-06 NOTE — Progress Notes (Signed)
Subjective:     Patient ID: [name], female   DOB: 03-Jul-1955, 58 y.o.   MRN: [id number]  Foot Pain   patient points stating I'm having a lot of pain in the outside of my foot and it's been going on now for several months   Review of Systems  All other systems reviewed and are negative.      Objective:   Physical Exam  Nursing note and vitals reviewed. Constitutional: She is oriented to person, place, and time.  Cardiovascular: Intact distal pulses.   Neurological: She is oriented to person, place, and time.  Skin: Skin is warm.   neurovascular status found to be intact with muscle strength adequate and range of motion of the subtalar midtarsal joint within normal limits. Patient's found to have mild equinus and good digital perfusion and I noted on the lateral aspect of the right foot there is inflammation and pain around the insertion of peroneal tendon into the base of the fifth metatarsal. Muscle strength of this tendon was adequate    Assessment:     Tendinitis peroneal tendon with also moderate plantar fascial symptomatology noted    Plan:     H&P and x-rays reviewed and at this time did a careful injection of the outside 3 mg Kenalog 5 mg Xylocaine Marcaine mixture and applied a fascially brace to stabilize the arch. Patient will be seen back to recheck

## 2013-12-06 NOTE — Telephone Encounter (Signed)
[name] will be out of the office on 8/21. Patient has appointment @ 8:30 needs rescheduling.

## 2013-12-07 ENCOUNTER — Ambulatory Visit: Payer: Self-pay | Admitting: Nurse Practitioner

## 2013-12-07 NOTE — Telephone Encounter (Signed)
Called patient and reschedule her appointment to sept 2nd @8 :30 am

## 2013-12-12 ENCOUNTER — Ambulatory Visit (INDEPENDENT_AMBULATORY_CARE_PROVIDER_SITE_OTHER): Payer: Medicare Other | Admitting: Podiatry

## 2013-12-12 ENCOUNTER — Encounter: Payer: Self-pay | Admitting: Podiatry

## 2013-12-12 VITALS — BP 108/67 | HR 75 | Resp 16

## 2013-12-12 DIAGNOSIS — M779 Enthesopathy, unspecified: Secondary | ICD-10-CM

## 2013-12-12 MED ORDER — TRIAMCINOLONE ACETONIDE 10 MG/ML IJ SUSP
10.0000 mg | Freq: Once | INTRAMUSCULAR | Status: AC
  Administered 2013-12-12: 10 mg

## 2013-12-12 NOTE — Progress Notes (Signed)
Subjective:     Patient ID: [name], female   DOB: 1955/10/31, 58 y.o.   MRN: [id number]  HPI patient states that I'm still getting a lot of pain in the outside of my right foot and the injection helped for a short period of time with reoccurrence   Review of Systems     Objective:   Physical Exam Neurovascular status intact with pain that slightly more proximal than previous but continued inflammation around the peroneal insertion into the base of the fifth metatarsal    Assessment:     Continue tendinitis right with remote possibility of tendon tear    Plan:     I carefully reinjected more proximal 3 mg Kenalog 5 mg Xylocaine and I applied an air fracture walker to completely let this rest and advised on ice. If no improvement we'll need to consider MRI

## 2013-12-19 ENCOUNTER — Ambulatory Visit (INDEPENDENT_AMBULATORY_CARE_PROVIDER_SITE_OTHER): Payer: Medicare Other | Admitting: Nurse Practitioner

## 2013-12-19 ENCOUNTER — Encounter: Payer: Self-pay | Admitting: Nurse Practitioner

## 2013-12-19 VITALS — BP 127/77 | HR 72 | Ht 66.0 in | Wt 220.0 lb

## 2013-12-19 DIAGNOSIS — M47817 Spondylosis without myelopathy or radiculopathy, lumbosacral region: Secondary | ICD-10-CM

## 2013-12-19 DIAGNOSIS — G5793 Unspecified mononeuropathy of bilateral lower limbs: Secondary | ICD-10-CM

## 2013-12-19 DIAGNOSIS — R209 Unspecified disturbances of skin sensation: Secondary | ICD-10-CM

## 2013-12-19 DIAGNOSIS — M4316 Spondylolisthesis, lumbar region: Secondary | ICD-10-CM

## 2013-12-19 DIAGNOSIS — M545 Low back pain, unspecified: Secondary | ICD-10-CM

## 2013-12-19 DIAGNOSIS — M79604 Pain in right leg: Secondary | ICD-10-CM

## 2013-12-19 DIAGNOSIS — R200 Anesthesia of skin: Secondary | ICD-10-CM

## 2013-12-19 DIAGNOSIS — G8929 Other chronic pain: Secondary | ICD-10-CM

## 2013-12-19 DIAGNOSIS — M79609 Pain in unspecified limb: Secondary | ICD-10-CM

## 2013-12-19 DIAGNOSIS — G609 Hereditary and idiopathic neuropathy, unspecified: Secondary | ICD-10-CM

## 2013-12-19 DIAGNOSIS — M4310 Spondylolisthesis, site unspecified: Secondary | ICD-10-CM

## 2013-12-19 MED ORDER — TOPIRAMATE 50 MG PO TABS: 50.0000 mg | ORAL_TABLET | Freq: Two times a day (BID) | ORAL | Status: DC

## 2013-12-19 NOTE — Patient Instructions (Addendum)
We will refer you to Dr. Vertell Limber [facility] for consult for your lower back, he will likely order new MRI of hte lumbar spine first.  We will add Topirimate 50 mg twice daily, if tolerated well, increase to 100 mg twice daily after 1 month. Common side effects are dizziness, tingling in the hands and feet, possibly around the mouth.  Carbonated drinks will have a bad taste.  Follow up in our office as needed.

## 2013-12-19 NOTE — Progress Notes (Signed)
PATIENT: [name] DOB: 11-Jul-1955  REASON FOR VISIT:  follow up for right leg numbness HISTORY FROM: patient  HISTORY OF PRESENT ILLNESS: Update 12/19/13 (LL): Patient returns to the office for leg numbness and weakness after consultation 2 years ago with Dr. Leonie Man. She sees Dr. Mariea Clonts, Podiatrist for tendonitis in right foot and Dr. Letta Pate for Lumbar spondylosis, had mild temporary relief from lumbar medial branch blocks, has had 4 injections to date. She was told she would be a good candidate for radiofrequency neurotomy. She states her right leg pain, numbness and weakness has gotten progressively worse over the last 2 years.  It has become difficult for her to ambulate and get up and down from a seated position.  She feels her balance has become progressively worse. She is taking gabapentin and tramadol for pain around the clock for pain.  Nerve conduction studies in 2013 revealed evidence of bilateral carpal tunnel syndrome, of mild severity on the right, and moderate severity on the left. There is no clear evidence of an underlying peripheral neuropathy. EMG evaluations of both lower extremities shows evidence of acute denervation in the gastrocnemius muscle, and chronic denervation in the biceps femoris muscles bilaterally in isolation. The clinical significance of this is unclear, but this could be associated with overlying sciatic neuropathies bilaterally, or possible bilateral S1 radiculopathies. Last MRI of the Lumbar spine in 2013 showed multilevel disc disease and severe facet disease contributing to spinal, lateral recess and foraminal stenosis at several levels. L4-5 was the most significant level.   Prior HPI 11/2011:  [name] is a 58 year  pleasant African American lady who's had chronic lower extremity weakness and gait and balance difficulties for a year now. She describes this as difficulty getting out of a chair she has to push herself several times to get up. She  has no difficulty climbing steps or going down the steps. He has been using a cane but states that her balance is still poor and she and fall easily. She complains of pain in her back as well as thighs and legs off and on. She has been diagnosed with degenerative spine disease and had MRI scan of her lumbar spine on 02/23/2010 which showed moderate central canal stenosis at L4-5 with foramina narrowing on the left. MRI scan of the cervical spine on 06/12/2010 showed small to moderate sized central disc protrusion at C3-4 with mass effect on the thecal sac and mild right foraminal stenosis. and advanced degenerative disc disease at C5-6. brain MRI dated  06/12/2010 showed a few punctate foci of FLAIR and T2 signal within the hemispheric white matter of the frontal lobes  without any acute abnormality. She takes cover contained and tramadol for her leg pain with moderate relief. She is currently per spreading physical therapy which appears to have helped her gait and balance. She has history of diabetes with neuropathy symptoms for about 10 years but underwent gastric bypass surgery in 2008 and in fact has now stopped her diabetic medications and is currently on diet control. She denies any bladder or bowel control symptoms of sharp shooting pain down her neck or spine when she bends her neck down.  REVIEW OF SYSTEMS: Full 14 system review of systems performed and notable only for: Constitutional: Weight loss   Fatigue   Cardiovascular: Swelling in legs   Eyes: Blurred vision   Respiratory: Short of breath   Cough   Endocrine: Feeling cold   Neurological: Headache  Weakness   Sleep: Insomnia   Skin: Itching   Gastrointestinal: Incontinence   Constipation   Genitourinary: Urination problems   Incontinence   Musculoskeletal: Joint pain   Joint swelling   Aching muscles   Allergy/Immunology: Frequent infections   Psychiatric: Depression   Not enough sleep   Change in appetite      ALLERGIES: Allergies  Allergen  Reactions  . Sulfonamide Derivatives Rash    REACTION: Syncope    HOME MEDICATIONS: Outpatient Prescriptions Prior to Visit  Medication Sig Dispense Refill  . aspirin EC 81 MG tablet Take 81 mg by mouth daily.      Marland Kitchen BLACK COHOSH PO Take 1 tablet by mouth daily.      . citalopram (CELEXA) 20 MG tablet Take 20 mg by mouth every morning.       . ferrous sulfate 325 (65 FE) MG tablet Take 325 mg by mouth daily.       . furosemide (LASIX) 40 MG tablet Take 40 mg by mouth daily as needed for edema.       . gabapentin (NEURONTIN) 400 MG capsule Take 1 capsule (400 mg total) by mouth 4 (four) times daily.  120 capsule  3  . MELATONIN PO Take 1 tablet by mouth daily.      . metoCLOPramide (REGLAN) 5 MG tablet Take 5 mg by mouth at bedtime.       . metoprolol tartrate (LOPRESSOR) 25 MG tablet Take 12.5 mg by mouth 2 (two) times daily. Takes 1/2 tablet      . Multiple Vitamins-Minerals (MULTIVITAMINS THER. W/MINERALS) TABS Take 1 tablet by mouth daily.       . nitroGLYCERIN (NITROSTAT) 0.4 MG SL tablet Place 0.4 mg under the tongue every 5 (five) minutes as needed for chest pain.       Marland Kitchen nystatin-triamcinolone (MYCOLOG II) cream       . oxybutynin (DITROPAN) 5 MG tablet       . potassium chloride SA (K-DUR,KLOR-CON) 20 MEQ tablet Take 20 mEq by mouth 2 (two) times daily.       Marland Kitchen PREMARIN vaginal cream       . promethazine (PHENERGAN) 25 MG tablet Take 25 mg by mouth every 6 (six) hours as needed for nausea.      . traMADol (ULTRAM) 50 MG tablet take 1 tablet by mouth every 6 hours if needed  120 tablet  4  . vitamin C (ASCORBIC ACID) 500 MG tablet Take 500 mg by mouth daily.      Marland Kitchen VITAMIN D, CHOLECALCIFEROL, PO Take 1,200 Units by mouth daily.      . cetirizine (ZYRTEC) 10 MG tablet Take 5 mg by mouth daily.      . diclofenac sodium (VOLTAREN) 1 % GEL Apply 2 g topically 4 (four) times daily as needed (for joint pain).      Marland Kitchen HYDROcodone-acetaminophen (NORCO/VICODIN) 5-325 MG per tablet       .  ketoconazole (NIZORAL) 2 % shampoo       . lisinopril-hydrochlorothiazide (PRINZIDE,ZESTORETIC) 10-12.5 MG per tablet Take 1 tablet by mouth every morning.       . pantoprazole (PROTONIX) 40 MG tablet Take 1 tablet (40 mg total) by mouth daily.       No facility-administered medications prior to visit.    PHYSICAL EXAM Filed Vitals:   12/19/13 0834  BP: 127/77  Pulse: 72  Height: 5\' 6"  (1.676 m)  Weight: 220 lb (99.791 kg)   Body mass index  is 35.53 kg/(m^2).  Physical Exam  General: Pleasant obese middle-aged [place] American lady, in no distress.  Afebrile.   Head: nontraumatic Ears, Nose and Throat: Hearing is normal.  Neck: supple without bruit Respiratory: clear to auscultation Cardiovascular: no murmur or gallop  Neurologic Exam  Mental Status: Awake, alert and oriented to time, place and person.  Speech and language appear normal.   Intact attention, registration and short-term Cranial Nerves: Eye movements are full range without nystagmus. Visual fields are full to confrontational testing.  Face is symmetric without weakness.  Tongue is midline. Hearing is normal. Motor: reveals no upper or lower extremity drift.  Symmetric and equal strength in both upper extremities.   Mild weakness of bilateral hip flexors, knee extensors and flexors and ankle dorsiflexors, 4/5. Sensory: Touch and pinprick sensations are diminished in a stocking distribution. Vibration sense is diminished from ankle down. Position sense is preserved. Romberg sign is weakly positive. Coordination: normal Gait and Station:  Broad-based slightly unsteady gait using a cane and unable to do  tandem walking Reflexes: Deep tendon reflexes are 2+ symmetric and brisk in the upper extremities but absent at both knees and ankles.  Plantars are downgoing.    ASSESSMENT: 58 year old lady with chronic leg weakness and gait difficulty likely multifactorial from combination of diabetic peripheral sensory neuropathy and  compressive myelopathy from degenerative spine disease.  Her symptoms have progressed since last seen by Dr. Leonie Man 2 years ago, pain, numbness and weakness have become debilitating.   PLAN: I had a long discussion with the patient regarding her symptoms, discussed results of previous evaluation and answered questions. Continue tramadol and gabapentin for the back and leg pain in the current dosages.  Add Topirimate 50 mg twice daily, if tolerated well, increase to 100 mg twice daily after 1 month.  We will refer to Dr. Vertell Limber, Neurosurgery for consult for worsening back pain and right leg weakness, numbness, and pain. Return for followup as needed or call if necessary.  Orders Placed This Encounter  Procedures  . Ambulatory referral to Neurosurgery   Meds ordered this encounter  Medications  . topiramate (TOPAMAX) 50 MG tablet    Sig: Take 1 tablet (50 mg total) by mouth 2 (two) times daily. Afetr 1 month, increase to 100 mg twice daily.    Dispense:  120 tablet    Refill:  12    Order Specific Question:  Supervising Provider    Answer:  Antony Contras [[id number]]   Return if symptoms worsen or fail to improve.  Philmore Pali, MSN, FNP-BC, A/GNP-C 12/19/2013, 8:09 PM [facility] [address] [place], [place] [address] [phone]  I have personally examined this patient, reviewed pertinent data, developed plan of care and discussed with patient and agree with above.  Antony Contras, MD [facility] [address] [place], [place] [address] [phone]   Note: This document was prepared with digital dictation and possible smart phrase technology. Any transcriptional errors that result from this process are unintentional.

## 2013-12-20 NOTE — Progress Notes (Signed)
I agree with the above plan 

## 2014-01-02 ENCOUNTER — Ambulatory Visit: Payer: Medicare Other | Admitting: Podiatry

## 2014-01-03 ENCOUNTER — Encounter: Payer: Self-pay | Admitting: Physical Medicine & Rehabilitation

## 2014-01-03 ENCOUNTER — Ambulatory Visit (HOSPITAL_BASED_OUTPATIENT_CLINIC_OR_DEPARTMENT_OTHER): Payer: Medicare Other | Admitting: Physical Medicine & Rehabilitation

## 2014-01-03 ENCOUNTER — Encounter: Payer: Medicare Other | Attending: Physical Medicine & Rehabilitation

## 2014-01-03 VITALS — BP 146/78 | HR 67 | Resp 14 | Ht 67.0 in | Wt 224.0 lb

## 2014-01-03 DIAGNOSIS — M129 Arthropathy, unspecified: Secondary | ICD-10-CM | POA: Insufficient documentation

## 2014-01-03 DIAGNOSIS — G4730 Sleep apnea, unspecified: Secondary | ICD-10-CM | POA: Diagnosis not present

## 2014-01-03 DIAGNOSIS — Z9884 Bariatric surgery status: Secondary | ICD-10-CM | POA: Diagnosis not present

## 2014-01-03 DIAGNOSIS — Q762 Congenital spondylolisthesis: Secondary | ICD-10-CM | POA: Insufficient documentation

## 2014-01-03 DIAGNOSIS — M542 Cervicalgia: Secondary | ICD-10-CM | POA: Insufficient documentation

## 2014-01-03 DIAGNOSIS — Z86718 Personal history of other venous thrombosis and embolism: Secondary | ICD-10-CM | POA: Insufficient documentation

## 2014-01-03 DIAGNOSIS — M545 Low back pain, unspecified: Secondary | ICD-10-CM | POA: Insufficient documentation

## 2014-01-03 DIAGNOSIS — IMO0002 Reserved for concepts with insufficient information to code with codable children: Secondary | ICD-10-CM

## 2014-01-03 DIAGNOSIS — M5416 Radiculopathy, lumbar region: Secondary | ICD-10-CM

## 2014-01-03 DIAGNOSIS — M25519 Pain in unspecified shoulder: Secondary | ICD-10-CM | POA: Diagnosis not present

## 2014-01-03 DIAGNOSIS — I10 Essential (primary) hypertension: Secondary | ICD-10-CM | POA: Diagnosis not present

## 2014-01-03 DIAGNOSIS — K219 Gastro-esophageal reflux disease without esophagitis: Secondary | ICD-10-CM | POA: Diagnosis not present

## 2014-01-03 MED ORDER — TRAMADOL HCL 50 MG PO TABS: 50.0000 mg | ORAL_TABLET | Freq: Four times a day (QID) | ORAL | Status: DC

## 2014-01-03 NOTE — Patient Instructions (Signed)

## 2014-01-03 NOTE — Progress Notes (Signed)
Right L4 Lumbar selective nerve root block under fluoroscopic guidance  Indication: Lumbosacral radiculitis is not relieved by medication management or other conservative care and interfering with self-care and mobility.   Informed consent was obtained after describing risk and benefits of the procedure with the patient, this includes bleeding, bruising, infection, paralysis and medication side effects.  The patient wishes to proceed and has given written consent.  Patient was placed in prone position.  The lumbar area was marked and prepped with Betadine.  It was entered with a 25-gauge 1-1/2 inch needle and one mL of 1% lidocaine was injected into the skin and subcutaneous tissue.  Then a 22-gauge 3.5 spinal needle was inserted into the Right L4-5  intervertebral foramen under AP, lateral, and oblique view.  Then a solution containing one mL of 10 mg per mL dexamethasone and 2 mL of 1% lidocaine was injected.  The patient tolerated procedure well.  Post procedure instructions were given.  Please see post procedure form.    Hip pain right post procedure Good sensation in the right lower extremity Normal strength in the right hip flexor knee extensor, give way weakness pain with right ankle, reviewed last podiatry note right ankle tendon tear history  Pain with right hip range of motion reproducing her current pain  Impression is that she is sore from prone positioning right hip, do not think this is a complication of injection which did not entered the epidural space. Post procedure instructions given. If she develops problems with walking may need ER evaluation.

## 2014-01-03 NOTE — Progress Notes (Signed)
  [facility]   Name: RAIGAN BARIA DOB:01/23/1956 MRN: [id number]  Date:01/03/2014  Physician: Alysia Penna, MD    Nurse/CMA: Shumaker RN  Allergies:  Allergies  Allergen Reactions  . Sulfonamide Derivatives Rash    REACTION: Syncope    Consent Signed: Yes.    Is patient diabetic? No.  CBG today?   Pregnant: No. LMP: No LMP recorded. Patient is postmenopausal. (age 60-55)  Anticoagulants: no Anti-inflammatory: no Antibiotics: no  Procedure: Right L4 Translaminar Epidural Steroid Injection Position: Prone Start Time:2:04 End Time: 2:08 Fluoro Time:19  RN/CMA Katherene Dinino CMA Shumaker RN    Time 135 2:12    BP 146/78 149/79    Pulse 67 52    Respirations 14 14    O2 Sat 97 100    S/S 6 6    Pain Level 6/10 6/10     D/C home with Ventura Sellers, patient A & O X 3, D/C instructions reviewed, and sits independently.

## 2014-01-07 ENCOUNTER — Encounter: Payer: Self-pay | Admitting: Podiatry

## 2014-01-07 ENCOUNTER — Ambulatory Visit (INDEPENDENT_AMBULATORY_CARE_PROVIDER_SITE_OTHER): Payer: Medicare Other | Admitting: Podiatry

## 2014-01-07 VITALS — BP 139/78 | HR 69 | Resp 18

## 2014-01-07 DIAGNOSIS — M779 Enthesopathy, unspecified: Secondary | ICD-10-CM | POA: Diagnosis not present

## 2014-01-08 NOTE — Progress Notes (Signed)
Subjective:     Patient ID: [name], female   DOB: 01/05/1956, 58 y.o.   MRN: [id number]  HPI patient presents stating my heel is feeling much better on my right foot and the outside of my foot feels better   Review of Systems     Objective:   Physical Exam Neurovascular status unchanged with discomfort that has reduced quite significantly in the lateral side of the right foot and the plantar heel upon palpation    Assessment:     Tendinitis which is improving right foot    Plan:     Reviewed condition and discussed physical therapy shoe gear modifications and reduced activity with gradual buildup of exercise area reappoint if symptoms persist

## 2014-02-01 ENCOUNTER — Other Ambulatory Visit: Payer: Self-pay

## 2014-02-01 ENCOUNTER — Ambulatory Visit: Payer: Medicare Other | Admitting: Physical Medicine & Rehabilitation

## 2014-02-18 ENCOUNTER — Other Ambulatory Visit: Payer: Self-pay | Admitting: Neurosurgery

## 2014-03-13 ENCOUNTER — Other Ambulatory Visit: Payer: Self-pay | Admitting: Physical Medicine & Rehabilitation

## 2014-03-13 NOTE — Telephone Encounter (Signed)
Refill request for tramadol.  No appt scheduled and not seen since September.  Will need follow up to get refill--declined

## 2014-04-15 ENCOUNTER — Other Ambulatory Visit: Payer: Self-pay | Admitting: Physical Medicine & Rehabilitation

## 2014-04-22 ENCOUNTER — Encounter (HOSPITAL_COMMUNITY): Payer: Self-pay

## 2014-04-22 ENCOUNTER — Inpatient Hospital Stay (HOSPITAL_COMMUNITY)
Admission: RE | Admit: 2014-04-22 | Discharge: 2014-04-22 | Disposition: A | Discharge status: Home / Self Care | Payer: Medicare Other | Source: Ambulatory Visit

## 2014-04-22 DIAGNOSIS — S9031XA Contusion of right foot, initial encounter: Secondary | ICD-10-CM | POA: Diagnosis not present

## 2014-04-22 HISTORY — DX: Insomnia, unspecified: G47.00

## 2014-04-22 NOTE — Pre-Procedure Instructions (Signed)
[name]  04/22/2014   Your procedure is scheduled on:  Tues, Jan 12 @ 7:30 AM  Report to [facility]  at 5:30 AM.  Call this number if you have problems the morning of surgery: [phone]   Remember:   Do not eat food or drink liquids after midnight.   Take these medicines the morning of surgery with A SIP OF WATER: Citalopram(Celexa),Gabapentin(Neurontin),Metoprolol(Lopressor),Oxybutynin(Ditropan),Phenergan(Promethazine),and Tramadol(Ultram)               Stop taking your Aspirin. No Goody's,BC's,Aleve,Ibuprofen,Fish Oil,or any Herbal Medications   Do not wear jewelry, make-up or nail polish.  Do not wear lotions, powders, or perfumes. You may wear deodorant.  Do not shave 48 hours prior to surgery.   Do not bring valuables to the hospital.  [facility] is not responsible                  for any belongings or valuables.               Contacts, dentures or bridgework may not be worn into surgery.  Leave suitcase in the car. After surgery it may be brought to your room.  For patients admitted to the hospital, discharge time is determined by your                treatment team.              Special Instructions:  [facility] - Preparing for Surgery  Before surgery, you can play an important role.  Because skin is not sterile, your skin needs to be as free of germs as possible.  You can reduce the number of germs on you skin by washing with CHG (chlorahexidine gluconate) soap before surgery.  CHG is an antiseptic cleaner which kills germs and bonds with the skin to continue killing germs even after washing.  Please DO NOT use if you have an allergy to CHG or antibacterial soaps.  If your skin becomes reddened/irritated stop using the CHG and inform your nurse when you arrive at Short Stay.  Do not shave (including legs and underarms) for at least 48 hours prior to the first CHG shower.  You may shave your face.  Please follow these instructions carefully:   1.   Shower with CHG Soap the night before surgery and the                                morning of Surgery.  2.  If you choose to wash your hair, wash your hair first as usual with your       normal shampoo.  3.  After you shampoo, rinse your hair and body thoroughly to remove the                      Shampoo.  4.  Use CHG as you would any other liquid soap.  You can apply chg directly       to the skin and wash gently with scrungie or a clean washcloth.  5.  Apply the CHG Soap to your body ONLY FROM THE NECK DOWN.        Do not use on open wounds or open sores.  Avoid contact with your eyes,       ears, mouth and genitals (private parts).  Wash genitals (private parts)       with your normal  soap.  6.  Wash thoroughly, paying special attention to the area where your surgery        will be performed.  7.  Thoroughly rinse your body with warm water from the neck down.  8.  DO NOT shower/wash with your normal soap after using and rinsing off       the CHG Soap.  9.  Pat yourself dry with a clean towel.            10.  Wear clean pajamas.            11.  Place clean sheets on your bed the night of your first shower and do not        sleep with pets.  Day of Surgery  Do not apply any lotions/deoderants the morning of surgery.  Please wear clean clothes to the hospital/surgery center.     Please read over the following fact sheets that you were given: Pain Booklet, Coughing and Deep Breathing, Blood Transfusion Information, MRSA Information and Surgical Site Infection Prevention

## 2014-04-22 NOTE — Progress Notes (Deleted)
Spoke with Marcia,rep for [employer].Notified her of pt surgery date and time.She says to call if they are needed and a rep will come up.

## 2014-04-24 ENCOUNTER — Ambulatory Visit: Payer: Medicare Other | Admitting: Podiatry

## 2014-04-29 ENCOUNTER — Inpatient Hospital Stay (HOSPITAL_COMMUNITY): Admission: RE | Admit: 2014-04-29 | Payer: Medicare Other | Source: Ambulatory Visit

## 2014-05-02 ENCOUNTER — Encounter (HOSPITAL_COMMUNITY): Payer: Self-pay | Admitting: Gastroenterology

## 2014-05-15 ENCOUNTER — Encounter (HOSPITAL_COMMUNITY): Payer: Self-pay

## 2014-05-15 ENCOUNTER — Encounter (HOSPITAL_COMMUNITY)
Admission: RE | Admit: 2014-05-15 | Discharge: 2014-05-15 | Disposition: A | Discharge status: Home / Self Care | Payer: Commercial Managed Care - HMO | Source: Ambulatory Visit | Attending: Neurosurgery | Admitting: Neurosurgery

## 2014-05-15 ENCOUNTER — Other Ambulatory Visit: Payer: Self-pay | Admitting: Physical Medicine & Rehabilitation

## 2014-05-15 DIAGNOSIS — Z01818 Encounter for other preprocedural examination: Secondary | ICD-10-CM | POA: Diagnosis not present

## 2014-05-15 DIAGNOSIS — Z01810 Encounter for preprocedural cardiovascular examination: Secondary | ICD-10-CM | POA: Insufficient documentation

## 2014-05-15 DIAGNOSIS — Z01812 Encounter for preprocedural laboratory examination: Secondary | ICD-10-CM | POA: Insufficient documentation

## 2014-05-15 DIAGNOSIS — I10 Essential (primary) hypertension: Secondary | ICD-10-CM | POA: Insufficient documentation

## 2014-05-15 DIAGNOSIS — I509 Heart failure, unspecified: Secondary | ICD-10-CM | POA: Insufficient documentation

## 2014-05-15 DIAGNOSIS — Z87891 Personal history of nicotine dependence: Secondary | ICD-10-CM | POA: Insufficient documentation

## 2014-05-15 HISTORY — DX: Other chronic pain: G89.29

## 2014-05-15 HISTORY — DX: Other injury of unspecified body region, initial encounter: T14.8XXA

## 2014-05-15 HISTORY — DX: Pneumonia, unspecified organism: J18.9

## 2014-05-15 HISTORY — DX: Dorsalgia, unspecified: M54.9

## 2014-05-15 HISTORY — DX: Effusion, unspecified joint: M25.40

## 2014-05-15 HISTORY — DX: Heart failure, unspecified: I50.9

## 2014-05-15 HISTORY — DX: Personal history of other diseases of the respiratory system: Z87.09

## 2014-05-15 HISTORY — DX: Polyneuropathy, unspecified: G62.9

## 2014-05-15 HISTORY — DX: Unspecified asthma, uncomplicated: J45.909

## 2014-05-15 HISTORY — DX: Urgency of urination: R39.15

## 2014-05-15 HISTORY — DX: Personal history of colon polyps, unspecified: Z86.0100

## 2014-05-15 HISTORY — DX: Unspecified osteoarthritis, unspecified site: M19.90

## 2014-05-15 HISTORY — DX: Pain in unspecified joint: M25.50

## 2014-05-15 HISTORY — DX: Personal history of colon polyps: Z86.010

## 2014-05-15 HISTORY — DX: Frequency of micturition: R35.0

## 2014-05-15 LAB — BASIC METABOLIC PANEL
Anion gap: 8 (ref 5–15)
BUN: 16 mg/dL (ref 6–23)
CHLORIDE: 116 mmol/L — AB (ref 96–112)
CO2: 19 mmol/L (ref 19–32)
CREATININE: 0.98 mg/dL (ref 0.50–1.10)
Calcium: 8.7 mg/dL (ref 8.4–10.5)
GFR calc Af Amer: 71 mL/min — ABNORMAL LOW (ref >90)
GFR calc non Af Amer: 61 mL/min — ABNORMAL LOW (ref >90)
Glucose, Bld: 92 mg/dL (ref 70–99)
POTASSIUM: 3.9 mmol/L (ref 3.5–5.1)
SODIUM: 143 mmol/L (ref 135–145)

## 2014-05-15 LAB — TYPE AND SCREEN
ABO/RH(D): O NEG
Antibody Screen: NEGATIVE

## 2014-05-15 LAB — CBC
HCT: 35.6 % — ABNORMAL LOW (ref 36.0–46.0)
HEMOGLOBIN: 11.8 g/dL — AB (ref 12.0–15.0)
MCH: 28.3 pg (ref 26.0–34.0)
MCHC: 33.1 g/dL (ref 30.0–36.0)
MCV: 85.4 fL (ref 78.0–100.0)
PLATELETS: 196 10*3/uL (ref 150–400)
RBC: 4.17 MIL/uL (ref 3.87–5.11)
RDW: 15.2 % (ref 11.5–15.5)
WBC: 5.2 10*3/uL (ref 4.0–10.5)

## 2014-05-15 LAB — ABO/RH: ABO/RH(D): O NEG

## 2014-05-15 LAB — SURGICAL PCR SCREEN
MRSA, PCR: NEGATIVE
STAPHYLOCOCCUS AUREUS: NEGATIVE

## 2014-05-15 NOTE — Progress Notes (Addendum)
Medical MD is Dr.Ajay Doylene Canard as well as Cardiologist(last visit in Nov 2015)  EKG in epic from 05-22-13  Stress test in epic from 2015  Heart cath was done in 2012 and by [facility] notes it was normal

## 2014-05-15 NOTE — Pre-Procedure Instructions (Signed)
[name]  05/15/2014   Your procedure is scheduled on:  Fri, Feb 5 @ 7:30 AM  Report to [facility]  at 5:30 AM.  Call this number if you have problems the morning of surgery: [phone]   Remember:   Do not eat food or drink liquids after midnight.   Take these medicines the morning of surgery with A SIP OF WATER: Citalopram(Celexa),Gabapentin(Neurontin),Metoprolol(Lopressor),Oxybutynin(Ditropan),Phenergan(Promethazine-if needed),and Tramadol(Ultram)               Stop taking your Aspirin. No Goody's,BC's,Aleve,Ibuprofen,Fish Oil,or any Herbal Medications   Do not wear jewelry, make-up or nail polish.  Do not wear lotions, powders, or perfumes. You may wear deodorant.  Do not shave 48 hours prior to surgery.   Do not bring valuables to the hospital.  [facility] is not responsible                  for any belongings or valuables.               Contacts, dentures or bridgework may not be worn into surgery.  Leave suitcase in the car. After surgery it may be brought to your room.  For patients admitted to the hospital, discharge time is determined by your                treatment team.                Special Instructions:  [place] - Preparing for Surgery  Before surgery, you can play an important role.  Because skin is not sterile, your skin needs to be as free of germs as possible.  You can reduce the number of germs on you skin by washing with CHG (chlorahexidine gluconate) soap before surgery.  CHG is an antiseptic cleaner which kills germs and bonds with the skin to continue killing germs even after washing.  Please DO NOT use if you have an allergy to CHG or antibacterial soaps.  If your skin becomes reddened/irritated stop using the CHG and inform your nurse when you arrive at Short Stay.  Do not shave (including legs and underarms) for at least 48 hours prior to the first CHG shower.  You may shave your face.  Please follow these instructions  carefully:   1.  Shower with CHG Soap the night before surgery and the                                morning of Surgery.  2.  If you choose to wash your hair, wash your hair first as usual with your       normal shampoo.  3.  After you shampoo, rinse your hair and body thoroughly to remove the                      Shampoo.  4.  Use CHG as you would any other liquid soap.  You can apply chg directly       to the skin and wash gently with scrungie or a clean washcloth.  5.  Apply the CHG Soap to your body ONLY FROM THE NECK DOWN.        Do not use on open wounds or open sores.  Avoid contact with your eyes,       ears, mouth and genitals (private parts).  Wash genitals (private parts)  with your normal soap.  6.  Wash thoroughly, paying special attention to the area where your surgery        will be performed.  7.  Thoroughly rinse your body with warm water from the neck down.  8.  DO NOT shower/wash with your normal soap after using and rinsing off       the CHG Soap.  9.  Pat yourself dry with a clean towel.            10.  Wear clean pajamas.            11.  Place clean sheets on your bed the night of your first shower and do not        sleep with pets.  Day of Surgery  Do not apply any lotions/deoderants the morning of surgery.  Please wear clean clothes to the hospital/surgery center.     Please read over the following fact sheets that you were given: Pain Booklet, Coughing and Deep Breathing, Blood Transfusion Information, MRSA Information and Surgical Site Infection Prevention

## 2014-05-15 NOTE — Progress Notes (Signed)
Sleep study done in 2002-wore a CPAP but since gastric bypass not needed since 2005

## 2014-05-16 NOTE — Progress Notes (Signed)
Anesthesia Chart Review:  Pt is 59 year old female scheduled for L4-5, L5-S1 maximum access PLIF on 05/24/2014 with Dr. Vertell Limber.   Cardiologist is Dr. Doylene Canard.   PMH includes: HTN, CHF, dysrhythmia ("heart tends to flutter", heart murmur (unspecified), OSA, DVT (2014), asthma, anemia. Former smoker. BMI 36  Preoperative labs reviewed.    EKG: sinus bradycardia (51 bpm).   Echo 04/03/2014: -Normal LV size and systolic function. Mild LVH and diastolic dysfunction.  -Normal RV size and systolic function.  -Mild MR and moderate TR. Mild PI.  -dilated aorta.   Nuclear stress test 05/22/2013: IMPRESSION: Normal examination without evidence of pharmacologically induced myocardial ischemia. Left ventricular ejection fraction is 66%.  Cardiac cath 09/17/2010: 1. Normal coronaries with left dominant system 2. Preserved LV systolic function  Pt has clearance for surgery from Dr. Doylene Canard noted in the echo report on chart.   If no changes, I anticipate pt can proceed with surgery as scheduled.   Willeen Cass, FNP-BC [facility]/Anesthesiology Phone: [phone] 05/16/2014 1:43 PM

## 2014-05-26 ENCOUNTER — Other Ambulatory Visit: Payer: Self-pay

## 2014-05-26 ENCOUNTER — Emergency Department (HOSPITAL_COMMUNITY)
Admission: EM | Admit: 2014-05-26 | Discharge: 2014-05-26 | Disposition: A | Discharge status: Home / Self Care | Payer: Commercial Managed Care - HMO | Attending: Emergency Medicine | Admitting: Emergency Medicine

## 2014-05-26 ENCOUNTER — Encounter (HOSPITAL_COMMUNITY): Payer: Self-pay | Admitting: Emergency Medicine

## 2014-05-26 ENCOUNTER — Emergency Department (HOSPITAL_COMMUNITY): Payer: Commercial Managed Care - HMO

## 2014-05-26 DIAGNOSIS — R112 Nausea with vomiting, unspecified: Secondary | ICD-10-CM | POA: Diagnosis not present

## 2014-05-26 DIAGNOSIS — M797 Fibromyalgia: Secondary | ICD-10-CM | POA: Diagnosis not present

## 2014-05-26 DIAGNOSIS — Z86718 Personal history of other venous thrombosis and embolism: Secondary | ICD-10-CM | POA: Insufficient documentation

## 2014-05-26 DIAGNOSIS — R011 Cardiac murmur, unspecified: Secondary | ICD-10-CM | POA: Insufficient documentation

## 2014-05-26 DIAGNOSIS — G629 Polyneuropathy, unspecified: Secondary | ICD-10-CM | POA: Diagnosis not present

## 2014-05-26 DIAGNOSIS — I10 Essential (primary) hypertension: Secondary | ICD-10-CM | POA: Diagnosis not present

## 2014-05-26 DIAGNOSIS — R109 Unspecified abdominal pain: Secondary | ICD-10-CM

## 2014-05-26 DIAGNOSIS — K529 Noninfective gastroenteritis and colitis, unspecified: Secondary | ICD-10-CM | POA: Diagnosis not present

## 2014-05-26 DIAGNOSIS — F329 Major depressive disorder, single episode, unspecified: Secondary | ICD-10-CM | POA: Diagnosis not present

## 2014-05-26 DIAGNOSIS — Z8701 Personal history of pneumonia (recurrent): Secondary | ICD-10-CM | POA: Diagnosis not present

## 2014-05-26 DIAGNOSIS — Z8781 Personal history of (healed) traumatic fracture: Secondary | ICD-10-CM | POA: Diagnosis not present

## 2014-05-26 DIAGNOSIS — K5730 Diverticulosis of large intestine without perforation or abscess without bleeding: Secondary | ICD-10-CM | POA: Diagnosis not present

## 2014-05-26 DIAGNOSIS — D649 Anemia, unspecified: Secondary | ICD-10-CM | POA: Insufficient documentation

## 2014-05-26 DIAGNOSIS — R1084 Generalized abdominal pain: Secondary | ICD-10-CM | POA: Diagnosis not present

## 2014-05-26 DIAGNOSIS — G4700 Insomnia, unspecified: Secondary | ICD-10-CM | POA: Insufficient documentation

## 2014-05-26 DIAGNOSIS — R404 Transient alteration of awareness: Secondary | ICD-10-CM | POA: Diagnosis not present

## 2014-05-26 DIAGNOSIS — Z86010 Personal history of colonic polyps: Secondary | ICD-10-CM | POA: Insufficient documentation

## 2014-05-26 DIAGNOSIS — J45909 Unspecified asthma, uncomplicated: Secondary | ICD-10-CM | POA: Diagnosis not present

## 2014-05-26 DIAGNOSIS — M1990 Unspecified osteoarthritis, unspecified site: Secondary | ICD-10-CM | POA: Insufficient documentation

## 2014-05-26 DIAGNOSIS — Z7982 Long term (current) use of aspirin: Secondary | ICD-10-CM | POA: Diagnosis not present

## 2014-05-26 DIAGNOSIS — E860 Dehydration: Secondary | ICD-10-CM | POA: Diagnosis not present

## 2014-05-26 DIAGNOSIS — R110 Nausea: Secondary | ICD-10-CM | POA: Diagnosis not present

## 2014-05-26 DIAGNOSIS — Z79899 Other long term (current) drug therapy: Secondary | ICD-10-CM | POA: Diagnosis not present

## 2014-05-26 DIAGNOSIS — R197 Diarrhea, unspecified: Secondary | ICD-10-CM | POA: Diagnosis not present

## 2014-05-26 DIAGNOSIS — I509 Heart failure, unspecified: Secondary | ICD-10-CM | POA: Diagnosis not present

## 2014-05-26 DIAGNOSIS — G8929 Other chronic pain: Secondary | ICD-10-CM | POA: Insufficient documentation

## 2014-05-26 DIAGNOSIS — K219 Gastro-esophageal reflux disease without esophagitis: Secondary | ICD-10-CM | POA: Diagnosis not present

## 2014-05-26 DIAGNOSIS — E876 Hypokalemia: Secondary | ICD-10-CM

## 2014-05-26 DIAGNOSIS — Z87891 Personal history of nicotine dependence: Secondary | ICD-10-CM | POA: Diagnosis not present

## 2014-05-26 DIAGNOSIS — R531 Weakness: Secondary | ICD-10-CM | POA: Diagnosis not present

## 2014-05-26 DIAGNOSIS — F419 Anxiety disorder, unspecified: Secondary | ICD-10-CM | POA: Insufficient documentation

## 2014-05-26 LAB — COMPREHENSIVE METABOLIC PANEL
ALK PHOS: 99 U/L (ref 39–117)
ALT: 34 U/L (ref 0–35)
ANION GAP: 11 (ref 5–15)
AST: 36 U/L (ref 0–37)
Albumin: 4.2 g/dL (ref 3.5–5.2)
BUN: 19 mg/dL (ref 6–23)
CHLORIDE: 106 mmol/L (ref 96–112)
CO2: 23 mmol/L (ref 19–32)
CREATININE: 0.91 mg/dL (ref 0.50–1.10)
Calcium: 9.4 mg/dL (ref 8.4–10.5)
GFR, EST AFRICAN AMERICAN: 78 mL/min — AB (ref >90)
GFR, EST NON AFRICAN AMERICAN: 67 mL/min — AB (ref >90)
Glucose, Bld: 135 mg/dL — ABNORMAL HIGH (ref 70–99)
Potassium: 2.9 mmol/L — ABNORMAL LOW (ref 3.5–5.1)
Sodium: 140 mmol/L (ref 135–145)
Total Bilirubin: 0.7 mg/dL (ref 0.3–1.2)
Total Protein: 7.4 g/dL (ref 6.0–8.3)

## 2014-05-26 LAB — CBC WITH DIFFERENTIAL/PLATELET
Basophils Absolute: 0 10*3/uL (ref 0.0–0.1)
Basophils Relative: 0 % (ref 0–1)
EOS ABS: 0 10*3/uL (ref 0.0–0.7)
Eosinophils Relative: 0 % (ref 0–5)
HEMATOCRIT: 45.3 % (ref 36.0–46.0)
Hemoglobin: 15.5 g/dL — ABNORMAL HIGH (ref 12.0–15.0)
Lymphocytes Relative: 18 % (ref 12–46)
Lymphs Abs: 2 10*3/uL (ref 0.7–4.0)
MCH: 29.1 pg (ref 26.0–34.0)
MCHC: 34.2 g/dL (ref 30.0–36.0)
MCV: 85.2 fL (ref 78.0–100.0)
Monocytes Absolute: 0.6 10*3/uL (ref 0.1–1.0)
Monocytes Relative: 6 % (ref 3–12)
NEUTROS PCT: 76 % (ref 43–77)
Neutro Abs: 8.3 10*3/uL — ABNORMAL HIGH (ref 1.7–7.7)
PLATELETS: 215 10*3/uL (ref 150–400)
RBC: 5.32 MIL/uL — ABNORMAL HIGH (ref 3.87–5.11)
RDW: 14.2 % (ref 11.5–15.5)
WBC: 10.9 10*3/uL — ABNORMAL HIGH (ref 4.0–10.5)

## 2014-05-26 LAB — URINALYSIS, ROUTINE W REFLEX MICROSCOPIC
Bilirubin Urine: NEGATIVE
Glucose, UA: NEGATIVE mg/dL
HGB URINE DIPSTICK: NEGATIVE
Ketones, ur: 15 mg/dL — AB
NITRITE: NEGATIVE
PH: 6.5 (ref 5.0–8.0)
Protein, ur: NEGATIVE mg/dL
SPECIFIC GRAVITY, URINE: 1.039 — AB (ref 1.005–1.030)
Urobilinogen, UA: 1 mg/dL (ref 0.0–1.0)

## 2014-05-26 LAB — URINE MICROSCOPIC-ADD ON

## 2014-05-26 LAB — POC OCCULT BLOOD, ED: Fecal Occult Bld: NEGATIVE

## 2014-05-26 LAB — MAGNESIUM: Magnesium: 1.7 mg/dL (ref 1.5–2.5)

## 2014-05-26 LAB — TROPONIN I: Troponin I: 0.03 ng/mL (ref <0.031)

## 2014-05-26 LAB — LIPASE, BLOOD: LIPASE: 22 U/L (ref 11–59)

## 2014-05-26 MED ORDER — SODIUM CHLORIDE 0.9 % IV BOLUS (SEPSIS)
500.0000 mL | Freq: Once | INTRAVENOUS | Status: AC
  Administered 2014-05-26: 500 mL via INTRAVENOUS

## 2014-05-26 MED ORDER — MORPHINE SULFATE 4 MG/ML IJ SOLN
4.0000 mg | Freq: Once | INTRAMUSCULAR | Status: AC
  Administered 2014-05-26: 4 mg via INTRAVENOUS
  Filled 2014-05-26: qty 1

## 2014-05-26 MED ORDER — POTASSIUM CHLORIDE CRYS ER 20 MEQ PO TBCR
60.0000 meq | EXTENDED_RELEASE_TABLET | Freq: Once | ORAL | Status: AC
  Administered 2014-05-26: 60 meq via ORAL
  Filled 2014-05-26: qty 3

## 2014-05-26 MED ORDER — ONDANSETRON HCL 4 MG/2ML IJ SOLN
4.0000 mg | Freq: Once | INTRAMUSCULAR | Status: AC
  Administered 2014-05-26: 4 mg via INTRAVENOUS
  Filled 2014-05-26: qty 2

## 2014-05-26 MED ORDER — HYDROCODONE-ACETAMINOPHEN 5-325 MG PO TABS: 1.0000 | ORAL_TABLET | Freq: Four times a day (QID) | ORAL | Status: DC | PRN

## 2014-05-26 MED ORDER — IOHEXOL 300 MG/ML  SOLN
100.0000 mL | Freq: Once | INTRAMUSCULAR | Status: AC | PRN
  Administered 2014-05-26: 100 mL via INTRAVENOUS

## 2014-05-26 MED ORDER — POTASSIUM CHLORIDE 10 MEQ/100ML IV SOLN
10.0000 meq | Freq: Once | INTRAVENOUS | Status: AC
  Administered 2014-05-26: 10 meq via INTRAVENOUS
  Filled 2014-05-26: qty 100

## 2014-05-26 MED ORDER — POTASSIUM CHLORIDE CRYS ER 20 MEQ PO TBCR: 20.0000 meq | EXTENDED_RELEASE_TABLET | Freq: Once | ORAL | Status: DC

## 2014-05-26 MED ORDER — IOHEXOL 300 MG/ML  SOLN
50.0000 mL | Freq: Once | INTRAMUSCULAR | Status: AC | PRN
  Administered 2014-05-26: 50 mL via ORAL

## 2014-05-26 MED ORDER — ONDANSETRON 4 MG PO TBDP
4.0000 mg | ORAL_TABLET | Freq: Three times a day (TID) | ORAL | Status: DC | PRN
Start: 2014-05-26 — End: 2014-07-04

## 2014-05-26 NOTE — ED Notes (Signed)
C/O N/V/D since Friday morning.  States took 12.5 mg of phenergan PO prior to arrival but had emesis shortly afterward.  Emesis on arrival of scant amount of clear fluid. PAbd pain 7/10.

## 2014-05-26 NOTE — ED Provider Notes (Signed)
CSN: [id number]     Arrival date & time 05/26/14  1122 History   First MD Initiated Contact with Patient 05/26/14 1158     Chief Complaint  Patient presents with  . Nausea     (Consider location/radiation/quality/duration/timing/severity/associated sxs/prior Treatment) HPI Comments: [name] is a 59 y.o. female with a PMHx of GERD, heart murmur, dysrhythmia ("heart flutters"), HTN, CHF, remote hx of DVT s/p IVC filter, anemia, sleep apnea, anxiety, depression, periph neuropathy, fibromyalgia, arthritis, chronic joint pain, and chronic back pain, and a PSHx of gastric bypass, cholecystectomy, hernia repair, EGD and balloon dilation by Dr. Paulita Fujita, and colonoscopy, who presents to the ED with complaints of abdominal pain, nausea, vomiting, and diarrhea that began Friday morning. She reports the pain is 7/10 "nagging pressure" pain which is intermittent in the lower abdominal region radiating into her epigastrium, worse with movement, relieved with tramadol. She has had 5 episodes of nonbloody bilious emesis in the last 24 hours, and has had 3 episodes of semisolid stool which is nonbloody in the last 24 hours. She states that Thursday night she had [place] hash rice and lima beans at home made by herself, and that last night she was able to eat some chicken noodle soup but otherwise she has not been able to tolerate anything by mouth. She reports that her friend was recently sick with similar symptoms, and she brought her soup last week, and believes she may have come into contact with the illness that her friend had. She denies any fevers, chills, chest pain, shortness of breath, leg swelling, hematemesis, melena, hematochezia, constipation, obstipation, rectal pain, dysuria, hematuria, vaginal bleeding or discharge, weakness, numbness, tingling, lightheadedness, EtOH use, NSAID use, suspicious food intake, recent travel or antibiotics. Tried phenergan prior to arrival without relief.  Patient is  a 59 y.o. female presenting with abdominal pain. The history is provided by the patient. No language interpreter was used.  Abdominal Pain Pain location:  LLQ, RLQ and suprapubic Pain quality: pressure   Pain radiates to:  Epigastric region Pain severity:  Moderate (7/10) Onset quality:  Gradual Duration:  2 days Timing:  Intermittent Progression:  Unchanged Chronicity:  New Context: sick contacts   Relieved by:  Nothing Worsened by:  Movement Ineffective treatments: tramadol. Associated symptoms: diarrhea, nausea and vomiting   Associated symptoms: no chest pain, no chills, no constipation, no dysuria, no fever, no flatus, no hematemesis, no hematochezia, no hematuria, no melena, no shortness of breath, no sore throat, no vaginal bleeding and no vaginal discharge   Diarrhea:    Quality:  Semi-solid   Number of occurrences:  3x/day   Severity:  Mild   Duration:  2 days   Timing:  Constant   Progression:  Unchanged Vomiting:    Quality:  Stomach contents   Number of occurrences:  5x/day   Severity:  Moderate   Duration:  2 days   Timing:  Constant   Progression:  Unchanged Risk factors: being elderly, multiple surgeries and obesity     Past Medical History  Diagnosis Date  . Depression   . Fibromyalgia   . Dysrhythmia     "heart tends to flutter"  . Sleep apnea     no cpap used in many yrs after weight lost-no machine now  . Heart murmur   . DVT (deep venous thrombosis) 01-17-13    past hx. -tx.5-6 yrs ago bilateral legs, occ. sporadic swelling, has IVC filter implanted  . Anxiety     takes  Citalopram daily  . Anemia     takes Ferrous Sulfate daily  . Insomnia     takes Melatonin daily  . Hypertension     takes Metoprolol daily  . Complication of anesthesia yrs ago    slow to wake up  . CHF (congestive heart failure)     takes Lasix daily as needed  . Asthma     2004-prior to gastric bypass and no problems since  . Pneumonia 90's    hx of  . History of  bronchitis 2012 or 2013  . Peripheral neuropathy   . Arthritis   . Joint pain   . Joint swelling   . Fracture     right foot and is in a cam boot  . Chronic back pain     spondylolisthesis/stenosis/radiculopathy  . GERD (gastroesophageal reflux disease)     hx of-no meds now  . History of colon polyps   . Urinary frequency   . Urinary urgency    Past Surgical History  Procedure Laterality Date  . Gastric by-pass  20004  . Esophagogastroduodenoscopy (egd) with propofol  03/01/2012    Procedure: ESOPHAGOGASTRODUODENOSCOPY (EGD) WITH PROPOFOL;  Surgeon: Arta Silence, MD;  Location: [facility];  Service: Endoscopy;  Laterality: N/A;  . Tonsillectomy      as child  . Dilation and curettage of uterus  yrs ago  . Hernia repair  2005  . Cholecystectomy  2007  . Ligament repair Right 1987    Rt. knee scope  . Insertion of vena cava filter  01-17-13    inserted 2004- "abdomen"  . Esophagogastroduodenoscopy (egd) with propofol N/A 01/31/2013    Procedure: ESOPHAGOGASTRODUODENOSCOPY (EGD) WITH PROPOFOL;  Surgeon: Arta Silence, MD;  Location: [facility];  Service: Endoscopy;  Laterality: N/A;  . Balloon dilation N/A 01/31/2013    Procedure: BALLOON DILATION;  Surgeon: Arta Silence, MD;  Location: [facility];  Service: Endoscopy;  Laterality: N/A;  . Colonoscopy     Family History  Problem Relation Age of Onset  . Diabetes Mother   . Heart disease Father   . Hypertension Father    History  Substance Use Topics  . Smoking status: Former Smoker -- 0.50 packs/day for 10 years    Types: Cigarettes    Quit date: 04/19/2009  . Smokeless tobacco: Never Used  . Alcohol Use: Yes     Comment: occ. social- wine   OB History    No data available     Review of Systems  Constitutional: Negative for fever and chills.  HENT: Negative for sore throat.   Respiratory: Negative for shortness of breath.   Cardiovascular: Negative for chest pain.  Gastrointestinal: Positive for  nausea, vomiting, abdominal pain and diarrhea. Negative for constipation, blood in stool, melena, hematochezia, abdominal distention, anal bleeding, rectal pain, flatus and hematemesis.  Genitourinary: Negative for dysuria, hematuria, flank pain, vaginal bleeding and vaginal discharge.  Musculoskeletal: Negative for myalgias and arthralgias.  Skin: Negative for color change.  Allergic/Immunologic: Negative for immunocompromised state.  Neurological: Negative for weakness, light-headedness and numbness.   10 Systems reviewed and are negative for acute change except as noted in the HPI.    Allergies  Sulfonamide derivatives  Home Medications   Prior to Admission medications   Medication Sig Start Date End Date Taking? Authorizing Provider  aspirin EC 81 MG tablet Take 81 mg by mouth daily.   Yes Historical Provider, MD  BLACK COHOSH PO Take 1 tablet by mouth daily.   Yes  Historical Provider, MD  gabapentin (NEURONTIN) 400 MG capsule Take 1 capsule (400 mg total) by mouth 4 (four) times daily. 11/13/13 11/13/14 Yes Charlett Blake, MD  topiramate (TOPAMAX) 50 MG tablet Take 1 tablet (50 mg total) by mouth 2 (two) times daily. Afetr 1 month, increase to 100 mg twice daily. Patient taking differently: Take 100 mg by mouth 2 (two) times daily. Afetr 1 month, increase to 100 mg twice daily. 12/19/13  Yes Philmore Pali, NP  traMADol (ULTRAM) 50 MG tablet Take 1 tablet (50 mg total) by mouth 4 (four) times daily. 01/03/14  Yes Charlett Blake, MD  CALCIUM PO Take 1 tablet by mouth daily. 600mg     Historical Provider, MD  citalopram (CELEXA) 20 MG tablet Take 20 mg by mouth every morning.     Historical Provider, MD  CRANBERRY PO Take 1 tablet by mouth daily.    Historical Provider, MD  ferrous sulfate 325 (65 FE) MG tablet Take 325 mg by mouth daily.     Historical Provider, MD  furosemide (LASIX) 40 MG tablet Take 40 mg by mouth daily as needed for edema.     Historical Provider, MD  ketoconazole  (NIZORAL) 2 % cream Apply 1 application topically daily.  12/12/13   Historical Provider, MD  MELATONIN PO Take 1 tablet by mouth at bedtime as needed. For sleep    Historical Provider, MD  metoCLOPramide (REGLAN) 5 MG tablet Take 5 mg by mouth every 6 (six) hours as needed for nausea.     Historical Provider, MD  metoprolol tartrate (LOPRESSOR) 25 MG tablet Take 12.5 mg by mouth 2 (two) times daily. Takes 1/2 tablet    Historical Provider, MD  Multiple Vitamins-Minerals (MULTIVITAMINS THER. W/MINERALS) TABS Take 1 tablet by mouth daily.     Historical Provider, MD  nitroGLYCERIN (NITROSTAT) 0.4 MG SL tablet Place 0.4 mg under the tongue every 5 (five) minutes as needed for chest pain.     Historical Provider, MD  nystatin-triamcinolone (MYCOLOG II) cream Apply 1 application topically daily.  08/02/13   Historical Provider, MD  potassium chloride SA (K-DUR,KLOR-CON) 20 MEQ tablet Take 20 mEq by mouth 2 (two) times daily.     Historical Provider, MD  promethazine (PHENERGAN) 25 MG tablet Take 25 mg by mouth every 6 (six) hours as needed for nausea.    Historical Provider, MD  vitamin C (ASCORBIC ACID) 500 MG tablet Take 500 mg by mouth daily.    Historical Provider, MD  VITAMIN D, CHOLECALCIFEROL, PO Take 1,200 Units by mouth daily.    Historical Provider, MD   BP 168/90 mmHg  Pulse 67  Temp(Src) 98.7 F (37.1 C) (Oral)  Resp 18  SpO2 100% Physical Exam  Constitutional: She is oriented to person, place, and time. She appears well-developed and well-nourished.  Non-toxic appearance. She appears distressed (appears uncomfortable).  Mildly hypertensive, with VSS otherwise. Afebrile, nontoxic, but appears uncomfortable.  HENT:  Head: Normocephalic and atraumatic.  Mouth/Throat: Oropharynx is clear and moist. Mucous membranes are dry.  Mildly dry mucous membranes  Eyes: Conjunctivae and EOM are normal. Right eye exhibits no discharge. Left eye exhibits no discharge.  Neck: Normal range of motion.  Neck supple.  Cardiovascular: Normal rate, regular rhythm, normal heart sounds and intact distal pulses.  Exam reveals no gallop and no friction rub.   No murmur heard. RRR, nl s1/s2, no m/r/g, distal pulses intact, no pedal edema   Pulmonary/Chest: Effort normal and breath sounds normal. No respiratory  distress. She has no decreased breath sounds. She has no wheezes. She has no rhonchi. She has no rales. She exhibits no tenderness.  Abdominal: Soft. Normal appearance and bowel sounds are normal. She exhibits no distension. There is generalized tenderness. There is no rigidity, no rebound, no guarding, no CVA tenderness, no tenderness at McBurney's point and negative Murphy's sign.  Soft, obese but ND, +BS throughout, with diffuse TTP throughout all quadrants, no r/g/r, neg murphy's, neg mcburney's, no CVA TTP   Musculoskeletal: Normal range of motion.  MAE x4 Strength and sensation intact Distal pulses intact  Neurological: She is alert and oriented to person, place, and time. She has normal strength. No sensory deficit.  Skin: Skin is warm, dry and intact. No rash noted.  Psychiatric: She has a normal mood and affect.  Nursing note and vitals reviewed.   ED Course  Procedures (including critical care time) CRITICAL CARE Performed by: Corine Shelter   Total critical care time: 30 for hypokalemia with U waves requiring IV potassium  Critical care time was exclusive of separately billable procedures and treating other patients.  Critical care was necessary to treat or prevent imminent or life-threatening deterioration.  Critical care was time spent personally by me on the following activities: development of treatment plan with patient and/or surrogate as well as nursing, discussions with consultants, evaluation of patient's response to treatment, examination of patient, obtaining history from patient or surrogate, ordering and performing treatments and interventions,  ordering and review of laboratory studies, ordering and review of radiographic studies, pulse oximetry and re-evaluation of patient's condition.  Labs Review Labs Reviewed  CBC WITH DIFFERENTIAL/PLATELET - Abnormal; Notable for the following:    WBC 10.9 (*)    RBC 5.32 (*)    Hemoglobin 15.5 (*)    Neutro Abs 8.3 (*)    All other components within normal limits  COMPREHENSIVE METABOLIC PANEL - Abnormal; Notable for the following:    Potassium 2.9 (*)    Glucose, Bld 135 (*)    GFR calc non Af Amer 67 (*)    GFR calc Af Amer 78 (*)    All other components within normal limits  URINALYSIS, ROUTINE W REFLEX MICROSCOPIC - Abnormal; Notable for the following:    APPearance CLOUDY (*)    Specific Gravity, Urine 1.039 (*)    Ketones, ur 15 (*)    Leukocytes, UA TRACE (*)    All other components within normal limits  URINE MICROSCOPIC-ADD ON - Abnormal; Notable for the following:    Squamous Epithelial / LPF MANY (*)    All other components within normal limits  LIPASE, BLOOD  TROPONIN I  MAGNESIUM  GI PATHOGEN PANEL BY PCR, STOOL  POC OCCULT BLOOD, ED    Imaging Review Ct Abdomen Pelvis W Contrast  05/26/2014   CLINICAL DATA:  Nausea, vomiting and diarrhea since Friday. History of gastric bypass (2004).  EXAM: CT ABDOMEN AND PELVIS WITH CONTRAST  TECHNIQUE: Multidetector CT imaging of the abdomen and pelvis was performed using the standard protocol following bolus administration of intravenous contrast.  CONTRAST:  40mL OMNIPAQUE IOHEXOL 300 MG/ML SOLN, 148mL OMNIPAQUE IOHEXOL 300 MG/ML SOLN  COMPARISON:  None.  FINDINGS: Normal hepatic contour. Post cholecystectomy. The CBD appears mildly dilated measuring 1 cm in greatest oblique coronal dimension (coronal image 44, series 5) with associated mild centralized intrahepatic biliary duct dilatation, likely the sequela of post cholecystectomy state. No ascites.  There is symmetric enhancement and excretion of the bilateral kidneys.  No definite  renal stones in this postcontrast examination. Punctate (approximately 0.6 cm) ill-defined hypo attenuating lesion within in the interpolar aspect of the right kidney (coronal image 58, series 5) is too small to adequately characterize of favored to represent a renal cyst. No discrete left-sided renal lesions. No urinary obstruction or perinephric stranding. There is mild thickening of the crux of the left adrenal gland without discrete nodule. Normal appearance of the right adrenal gland, pancreas and spleen.  Postsurgical change of the upper abdomen compatible with provided history of gastric bypass. A very minimal amount of enteric contrast has been ingested and is seen passing to the level of the proximal small bowel. Scattered colonic diverticulosis without evidence of diverticulitis. Normal appearance of the retrocecal appendix. No pneumoperitoneum, pneumatosis or portal venous gas.  Normal caliber of the abdominal aorta. The major branch vessels of the abdominal aorta appear widely patent on this non CTA examination. A IVC filter is noted within the infrarenal IVC. Several legs of the filter extend beyond the confines of the IVC, one of which extends to involve the anterior inferior aspect of the L3 vertebral body (sagittal image 69 and 70, series 6; coronal image 55, series 5).  No bulky retroperitoneal, mesenteric, pelvic or inguinal lymphadenopathy.  Normal appearance of the pelvic organs. No discrete adnexal lesion. Normal appearance of the urinary bladder given degree distention. Several phleboliths are seen within the lower pelvis bilaterally. No free fluid within the pelvic cul-de-sac.  Limited visualization of the lower thorax demonstrates minimal subsegmental atelectasis within the imaged left lower lobe. No discrete focal airspace opacities. No pleural effusion.  Cardiomegaly.  No pericardial effusion.  Mild to moderate scoliotic curvature of the thoracolumbar spine with dominant cranial component  convex to the right. Mild-to-moderate multilevel lumbar spine DDD, worse at L5-S1 with disc space height loss, endplate irregularity and small posteriorly directed disc osteophyte complex at this location.  Post mesh repair of the ventral abdominal wall without evidence of hernia recurrence. Regional soft tissues appear otherwise normal.  IMPRESSION: 1. No explanation for patient's nausea, vomiting and diarrhea. Specifically, no evidence of enteric or urinary obstruction. Normal appearance of the appendix. 2. Postsurgical change of the stomach compatible with provided history of prior gastric bypass surgery. Again, while only a small amount of enteric contrast has been ingested, there is no evidence of enteric obstruction.   Electronically Signed   By: Sandi Mariscal M.D.   On: 05/26/2014 15:04     EKG Interpretation None    EKG: NSR with U waves  MDM   Final diagnoses:  Abdominal pain  Nausea vomiting and diarrhea  Hypokalemia  Dehydration  Gastroenteritis    59 y.o. female with abd pain diffusely and n/v/d x2 days. +Sick contacts. Abd exam diffusely tender although nonperitoneal, will obtain CT. Will obtain labs, EKG, troponin, and collect stool sample if possible to send for PCR panel. Will give morphine and zofran now. Will give gentle fluids since pt has hx of CHF. DDx includes gastroenteritis vs obstruction vs diverticulitis vs other intraabdominal process. Will reassess shortly.   2:04 PM FOBT neg. CBC w/diff showing mild leukocytosis but I believe this is likely from hemoconcentration as her H/H is noted to be much higher than previous lab work. CMP showing potassium of 2.9, will replete 26mEq IV now. Magnesium 1.7. Trop neg. Lipase WNL. EKG shows NSR and U waves but otherwise WNL. Awaiting CT.   2:40 PM Pt states nausea improved, pain improved. Will give second potassium  dose of 46mEq IV and give 74mEq PO. Still awaiting urine specimen and CT results.   4:09 PM U/A contaminated  without signs of infection, does show dehydration. CT showing no significant findings, therefore her symptoms are likely due to a viral gastroenteritis. Delays in getting second IV potassium dose and PO potassium dose. Pt just received this now, and tolerated PO but after getting the potassium pills she does feel some pain and nausea returning, but did not vomit and continues to tolerate fluids. No meds in 4 hrs. Will give morphine and zofran again. Plan to d/c home with potassium 45mEq, pain meds, and zofran. Doubt need for antibiotics, doubt acute bacterial infectious process. Will have her f/up with PCP in 3-4 days for recheck of BMP and for ongoing symptoms. Pt to be discharged home once IV potassium has finished. I explained the diagnosis and have given explicit precautions to return to the ER including for any other new or worsening symptoms. The patient understands and accepts the medical plan as it's been dictated and I have answered their questions. Discharge instructions concerning home care and prescriptions have been given. The patient is STABLE and is discharged to home in good condition.  BP 151/61 mmHg  Pulse 56  Temp(Src) 98.7 F (37.1 C) (Oral)  Resp 18  SpO2 99%  Meds ordered this encounter  Medications  . sodium chloride 0.9 % bolus 500 mL    Sig:   . ondansetron (ZOFRAN) injection 4 mg    Sig:   . morphine 4 MG/ML injection 4 mg    Sig:   . potassium chloride 10 mEq in 100 mL IVPB    Sig:   . iohexol (OMNIPAQUE) 300 MG/ML solution 50 mL    Sig:   . iohexol (OMNIPAQUE) 300 MG/ML solution 100 mL    Sig:   . potassium chloride 10 mEq in 100 mL IVPB    Sig:   . potassium chloride SA (K-DUR,KLOR-CON) CR tablet 60 mEq    Sig:   . morphine 4 MG/ML injection 4 mg    Sig:   . ondansetron (ZOFRAN) injection 4 mg    Sig:   . potassium chloride SA (K-DUR,KLOR-CON) 20 MEQ tablet    Sig: Take 1 tablet (20 mEq total) by mouth once.    Dispense:  1 tablet    Refill:  0    Order  Specific Question:  Supervising Provider    Answer:  Noemi Chapel D [[id number]]  . HYDROcodone-acetaminophen (NORCO) 5-325 MG per tablet    Sig: Take 1 tablet by mouth every 6 (six) hours as needed for severe pain.    Dispense:  6 tablet    Refill:  0    Order Specific Question:  Supervising Provider    Answer:  Noemi Chapel D [[id number]]  . ondansetron (ZOFRAN ODT) 4 MG disintegrating tablet    Sig: Take 1 tablet (4 mg total) by mouth every 8 (eight) hours as needed for nausea or vomiting.    Dispense:  15 tablet    Refill:  0    Order Specific Question:  Supervising Provider    Answer:  Johnna Acosta [address] [place], PA-C 05/26/14 1635  Varney Biles, MD 05/28/14 1054

## 2014-05-26 NOTE — Discharge Instructions (Signed)
Use zofran as prescribed, as needed for nausea. Stay well hydrated with small sips of fluids throughout the day. Use norco as directed as needed for pain, but don't drive while taking this medication. Take the potassium supplement as directed. Follow a BRAT (banana-rice-applesauce-toast) diet as described below for the next 24-48 hours. The 'BRAT' diet is suggested, then progress to diet as tolerated as symptoms abate. Call if bloody stools, persistent diarrhea, vomiting, fever or abdominal pain. See your regular doctor in 3-4 days to recheck your lab work and re-evaluate your symptoms. Return to ER for changing or worsening of symptoms.  Abdominal (belly) pain can be caused by many things. Your caregiver performed an examination and possibly ordered blood/urine tests and imaging (CT scan, x-rays, ultrasound). Many cases can be observed and treated at home after initial evaluation in the emergency department. Even though you are being discharged home, abdominal pain can be unpredictable. Therefore, you need a repeated exam if your pain does not resolve, returns, or worsens. Most patients with abdominal pain don't have to be admitted to the hospital or have surgery, but serious problems like appendicitis and gallbladder attacks can start out as nonspecific pain. Many abdominal conditions cannot be diagnosed in one visit, so follow-up evaluations are very important. SEEK IMMEDIATE MEDICAL ATTENTION IF YOU DEVELOP ANY OF THE FOLLOWING SYMPTOMS:  The pain does not go away or becomes severe.   A temperature above 101 develops.   Repeated vomiting occurs (multiple episodes).   The pain becomes localized to portions of the abdomen. The right side could possibly be appendicitis. In an adult, the left lower portion of the abdomen could be colitis or diverticulitis.   Blood is being passed in stools or vomit (bright red or black tarry stools).   Return also if you develop chest pain, difficulty breathing,  dizziness or fainting, or become confused, poorly responsive, or inconsolable (young children).  The constipation stays for more than 4 days.   There is belly (abdominal) or rectal pain.   You do not seem to be getting better.   Food Choices to Help Relieve Diarrhea When you have diarrhea, the foods you eat and your eating habits are very important. Choosing the right foods and drinks can help relieve diarrhea. Also, because diarrhea can last up to 7 days, you need to replace lost fluids and electrolytes (such as sodium, potassium, and chloride) in order to help prevent dehydration.  WHAT GENERAL GUIDELINES DO I NEED TO FOLLOW?  Slowly drink 1 cup (8 oz) of fluid for each episode of diarrhea. If you are getting enough fluid, your urine will be clear or pale yellow.  Eat starchy foods. Some good choices include white rice, white toast, pasta, low-fiber cereal, baked potatoes (without the skin), saltine crackers, and bagels.  Avoid large servings of any cooked vegetables.  Limit fruit to two servings per day. A serving is  cup or 1 small piece.  Choose foods with less than 2 g of fiber per serving.  Limit fats to less than 8 tsp (38 g) per day.  Avoid fried foods.  Eat foods that have probiotics in them. Probiotics can be found in certain dairy products.  Avoid foods and beverages that may increase the speed at which food moves through the stomach and intestines (gastrointestinal tract). Things to avoid include:  High-fiber foods, such as dried fruit, raw fruits and vegetables, nuts, seeds, and whole grain foods.  Spicy foods and high-fat foods.  Foods and beverages sweetened with  high-fructose corn syrup, honey, or sugar alcohols such as xylitol, sorbitol, and mannitol. WHAT FOODS ARE RECOMMENDED? Grains White rice. White, [place], or pita breads (fresh or toasted), including plain rolls, buns, or bagels. White pasta. Saltine, soda, or graham crackers. Pretzels. Low-fiber cereal.  Cooked cereals made with water (such as cornmeal, farina, or cream cereals). Plain muffins. Matzo. Melba toast. Zwieback.  Vegetables Potatoes (without the skin). Strained tomato and vegetable juices. Most well-cooked and canned vegetables without seeds. Tender lettuce. Fruits Cooked or canned applesauce, apricots, cherries, fruit cocktail, grapefruit, peaches, pears, or plums. Fresh bananas, apples without skin, cherries, grapes, cantaloupe, grapefruit, peaches, oranges, or plums.  Meat and Other Protein Products Baked or boiled chicken. Eggs. Tofu. Fish. Seafood. Smooth peanut butter. Ground or well-cooked tender beef, ham, veal, lamb, pork, or poultry.  Dairy Plain yogurt, kefir, and unsweetened liquid yogurt. Lactose-free milk, buttermilk, or soy milk. Plain hard cheese. Beverages Sport drinks. Clear broths. Diluted fruit juices (except prune). Regular, caffeine-free sodas such as ginger ale. Water. Decaffeinated teas. Oral rehydration solutions. Sugar-free beverages not sweetened with sugar alcohols. Other Bouillon, broth, or soups made from recommended foods.  The items listed above may not be a complete list of recommended foods or beverages. Contact your dietitian for more options. WHAT FOODS ARE NOT RECOMMENDED? Grains Whole grain, whole wheat, bran, or rye breads, rolls, pastas, crackers, and cereals. Wild or brown rice. Cereals that contain more than 2 g of fiber per serving. Corn tortillas or taco shells. Cooked or dry oatmeal. Granola. Popcorn. Vegetables Raw vegetables. Cabbage, broccoli, Brussels sprouts, artichokes, baked beans, beet greens, corn, kale, legumes, peas, sweet potatoes, and yams. Potato skins. Cooked spinach and cabbage. Fruits Dried fruit, including raisins and dates. Raw fruits. Stewed or dried prunes. Fresh apples with skin, apricots, mangoes, pears, raspberries, and strawberries.  Meat and Other Protein Products Chunky peanut butter. Nuts and seeds. Beans and  lentils. Berniece Salines.  Dairy High-fat cheeses. Milk, chocolate milk, and beverages made with milk, such as milk shakes. Cream. Ice cream. Sweets and Desserts Sweet rolls, doughnuts, and sweet breads. Pancakes and waffles. Fats and Oils Butter. Cream sauces. Margarine. Salad oils. Plain salad dressings. Olives. Avocados.  Beverages Caffeinated beverages (such as coffee, tea, soda, or energy drinks). Alcoholic beverages. Fruit juices with pulp. Prune juice. Soft drinks sweetened with high-fructose corn syrup or sugar alcohols. Other Coconut. Hot sauce. Chili powder. Mayonnaise. Gravy. Cream-based or milk-based soups.  The items listed above may not be a complete list of foods and beverages to avoid. Contact your dietitian for more information. WHAT SHOULD I DO IF I BECOME DEHYDRATED? Diarrhea can sometimes lead to dehydration. Signs of dehydration include dark urine and dry mouth and skin. If you think you are dehydrated, you should rehydrate with an oral rehydration solution. These solutions can be purchased at pharmacies, retail stores, or online.  Drink -1 cup (120-240 mL) of oral rehydration solution each time you have an episode of diarrhea. If drinking this amount makes your diarrhea worse, try drinking smaller amounts more often. For example, drink 1-3 tsp (5-15 mL) every 5-10 minutes.  A general rule for staying hydrated is to drink 1-2 L of fluid per day. Talk to your health care provider about the specific amount you should be drinking each day. Drink enough fluids to keep your urine clear or pale yellow. Document Released: 06/26/2003 Document Revised: 04/10/2013 Document Reviewed: 02/26/2013 [facility] Patient Information 2015 [place], [place]. This information is not intended to replace advice given to you by  your health care provider. Make sure you discuss any questions you have with your health care provider.   Viral Gastroenteritis Viral gastroenteritis is also called stomach flu. This  illness is caused by a certain type of germ (virus). It can cause sudden watery poop (diarrhea) and throwing up (vomiting). This can cause you to lose body fluids (dehydration). This illness usually lasts for 3 to 8 days. It usually goes away on its own. HOME CARE   Drink enough fluids to keep your pee (urine) clear or pale yellow. Drink small amounts of fluids often.  Ask your doctor how to replace body fluid losses (rehydration).  Avoid:  Foods high in sugar.  Alcohol.  Bubbly (carbonated) drinks.  Tobacco.  Juice.  Caffeine drinks.  Very hot or cold fluids.  Fatty, greasy foods.  Eating too much at one time.  Dairy products until 24 to 48 hours after your watery poop stops.  You may eat foods with active cultures (probiotics). They can be found in some yogurts and supplements.  Wash your hands well to avoid spreading the illness.  Only take medicines as told by your doctor. Do not give aspirin to children. Do not take medicines for watery poop (antidiarrheals).  Ask your doctor if you should keep taking your regular medicines.  Keep all doctor visits as told. GET HELP RIGHT AWAY IF:   You cannot keep fluids down.  You do not pee at least once every 6 to 8 hours.  You are short of breath.  You see blood in your poop or throw up. This may look like coffee grounds.  You have belly (abdominal) pain that gets worse or is just in one small spot (localized).  You keep throwing up or having watery poop.  You have a fever.  The patient is a child younger than 3 months, and he or she has a fever.  The patient is a child older than 3 months, and he or she has a fever and problems that do not go away.  The patient is a child older than 3 months, and he or she has a fever and problems that suddenly get worse.  The patient is a baby, and he or she has no tears when crying. MAKE SURE YOU:   Understand these instructions.  Will watch your condition.  Will get  help right away if you are not doing well or get worse. Document Released: 09/22/2007 Document Revised: 06/28/2011 Document Reviewed: 01/20/2011 [facility] Patient Information 2015 [place], [place]. This information is not intended to replace advice given to you by your health care provider. Make sure you discuss any questions you have with your health care provider.  Rehydration, Adult Rehydration is the replacement of body fluids lost during dehydration. Dehydration is an extreme loss of body fluids to the point of body function impairment. There are many ways extreme fluid loss can occur, including vomiting, diarrhea, or excess sweating. Recovering from dehydration requires replacing lost fluids, continuing to eat to maintain strength, and avoiding foods and beverages that may contribute to further fluid loss or may increase nausea. HOW TO REHYDRATE In most cases, rehydration involves the replacement of not only fluids but also carbohydrates and basic body salts. Rehydration with an oral rehydration solution is one way to replace essential nutrients lost through dehydration. An oral rehydration solution can be purchased at pharmacies, retail stores, and online. Premixed packets of powder that you combine with water to make a solution are also sold. You can prepare  an oral rehydration solution at home by mixing the following ingredients together:    - tsp table salt.   tsp baking soda.   tsp salt substitute containing potassium chloride.  1 tablespoons sugar.  1 L (34 oz) of water. Be sure to use exact measurements. Including too much sugar can make diarrhea worse. Drink -1 cup (120-240 mL) of oral rehydration solution each time you have diarrhea or vomit. If drinking this amount makes your vomiting worse, try drinking smaller amounts more often. For example, drink 1-3 tsp every 5-10 minutes.  A general rule for staying hydrated is to drink 1-2 L of fluid per day. Talk to your caregiver about  the specific amount you should be drinking each day. Drink enough fluids to keep your urine clear or pale yellow. EATING WHEN DEHYDRATED Even if you have had severe sweating or you are having diarrhea, do not stop eating. Many healthy items in a normal diet are okay to continue eating while recovering from dehydration. The following tips can help you to lessen nausea when you eat:  Ask someone else to prepare your food. Cooking smells may worsen nausea.  Eat in a well-ventilated room away from cooking smells.  Sit up when you eat. Avoid lying down until 1-2 hours after eating.  Eat small amounts when you eat.  Eat foods that are easy to digest. These include soft, well-cooked, or mashed foods. FOODS AND BEVERAGES TO AVOID Avoid eating or drinking the following foods and beverages that may increase nausea or further loss of fluid:   Fruit juices with a high sugar content, such as concentrated juices.  Alcohol.  Beverages containing caffeine.  Carbonated drinks. They may cause a lot of gas.  Foods that may cause a lot of gas, such as cabbage, broccoli, and beans.  Fatty, greasy, and fried foods.  Spicy, very salty, and very sweet foods or drinks.  Foods or drinks that are very hot or very cold. Consume food or drinks at or near room temperature.  Foods that need a lot of chewing, such as raw vegetables.  Foods that are sticky or hard to swallow, such as peanut butter. Document Released: 06/28/2011 Document Revised: 12/29/2011 Document Reviewed: 06/28/2011 [facility] Patient Information 2015 [place], [place]. This information is not intended to replace advice given to you by your health care provider. Make sure you discuss any questions you have with your health care provider.  Potassium Content of Foods Potassium is a mineral found in many foods and drinks. It helps keep fluids and minerals balanced in your body and affects how steadily your heart beats. Potassium also helps control  your blood pressure and keep your muscles and nervous system healthy. Certain health conditions and medicines may change the balance of potassium in your body. When this happens, you can help balance your level of potassium through the foods that you do or do not eat. Your health care provider or dietitian may recommend an amount of potassium that you should have each day. The following lists of foods provide the amount of potassium (in parentheses) per serving in each item. HIGH IN POTASSIUM  The following foods and beverages have 200 mg or more of potassium per serving:  Apricots, 2 raw or 5 dry (200 mg).  Artichoke, 1 medium (345 mg).  Avocado, raw,  each (245 mg).  Banana, 1 medium (425 mg).  Beans, lima, or baked beans, canned,  cup (280 mg).  Beans, white, canned,  cup (595 mg).  Beef  roast, 3 oz (320 mg).  Beef, ground, 3 oz (270 mg).  Beets, raw or cooked,  cup (260 mg).  Bran muffin, 2 oz (300 mg).  Broccoli,  cup (230 mg).  Brussels sprouts,  cup (250 mg).  Cantaloupe,  cup (215 mg).  Cereal, 100% bran,  cup (200-400 mg).  Cheeseburger, single, fast food, 1 each (225-400 mg).  Chicken, 3 oz (220 mg).  Clams, canned, 3 oz (535 mg).  Crab, 3 oz (225 mg).  Dates, 5 each (270 mg).  Dried beans and peas,  cup (300-475 mg).  Figs, dried, 2 each (260 mg).  Fish: halibut, tuna, cod, snapper, 3 oz (480 mg).  Fish: salmon, haddock, swordfish, perch, 3 oz (300 mg).  Fish, tuna, canned 3 oz (200 mg).  [place] fries, fast food, 3 oz (470 mg).  Granola with fruit and nuts,  cup (200 mg).  Grapefruit juice,  cup (200 mg).  Greens, beet,  cup (655 mg).  Honeydew melon,  cup (200 mg).  Kale, raw, 1 cup (300 mg).  Kiwi, 1 medium (240 mg).  Kohlrabi, rutabaga, parsnips,  cup (280 mg).  Lentils,  cup (365 mg).  Mango, 1 each (325 mg).  Milk, chocolate, 1 cup (420 mg).  Milk: nonfat, low-fat, whole, buttermilk, 1 cup (350-380  mg).  Molasses, 1 Tbsp (295 mg).  Mushrooms,  cup (280) mg.  Nectarine, 1 each (275 mg).  Nuts: almonds, peanuts, hazelnuts, [place], cashew, mixed, 1 oz (200 mg).  Nuts, pistachios, 1 oz (295 mg).  Orange, 1 each (240 mg).  Orange juice,  cup (235 mg).  Papaya, medium,  fruit (390 mg).  Peanut butter, chunky, 2 Tbsp (240 mg).  Peanut butter, smooth, 2 Tbsp (210 mg).  Pear, 1 medium (200 mg).  Pomegranate, 1 whole (400 mg).  Pomegranate juice,  cup (215 mg).  Pork, 3 oz (350 mg).  Potato chips, salted, 1 oz (465 mg).  Potato, baked with skin, 1 medium (925 mg).  Potatoes, boiled,  cup (255 mg).  Potatoes, mashed,  cup (330 mg).  Prune juice,  cup (370 mg).  Prunes, 5 each (305 mg).  Pudding, chocolate,  cup (230 mg).  Pumpkin, canned,  cup (250 mg).  Raisins, seedless,  cup (270 mg).  Seeds, sunflower or pumpkin, 1 oz (240 mg).  Soy milk, 1 cup (300 mg).  Spinach,  cup (420 mg).  Spinach, canned,  cup (370 mg).  Sweet potato, baked with skin, 1 medium (450 mg).  Swiss chard,  cup (480 mg).  Tomato or vegetable juice,  cup (275 mg).  Tomato sauce or puree,  cup (400-550 mg).  Tomato, raw, 1 medium (290 mg).  Tomatoes, canned,  cup (200-300 mg).  [place], 3 oz (250 mg).  Wheat germ, 1 oz (250 mg).  Winter squash,  cup (250 mg).  Yogurt, plain or fruited, 6 oz (260-435 mg).  Zucchini,  cup (220 mg). MODERATE IN POTASSIUM The following foods and beverages have 50-200 mg of potassium per serving:  Apple, 1 each (150 mg).  Apple juice,  cup (150 mg).  Applesauce,  cup (90 mg).  Apricot nectar,  cup (140 mg).  Asparagus, small spears,  cup or 6 spears (155 mg).  Bagel, cinnamon raisin, 1 each (130 mg).  Bagel, egg or plain, 4 in., 1 each (70 mg).  Beans, green,  cup (90 mg).  Beans, yellow,  cup (190 mg).  Beer, regular, 12 oz (100 mg).  Beets, canned,  cup (125 mg).  Blackberries,  cup (115  mg).  Blueberries,  cup (60 mg).  Bread, whole wheat, 1 slice (70 mg).  Broccoli, raw,  cup (145 mg).  Cabbage,  cup (150 mg).  Carrots, cooked or raw,  cup (180 mg).  Cauliflower, raw,  cup (150 mg).  Celery, raw,  cup (155 mg).  Cereal, bran flakes, cup (120-150 mg).  Cheese, cottage,  cup (110 mg).  Cherries, 10 each (150 mg).  Chocolate, 1 oz bar (165 mg).  Coffee, brewed 6 oz (90 mg).  Corn,  cup or 1 ear (195 mg).  Cucumbers,  cup (80 mg).  Egg, large, 1 each (60 mg).  Eggplant,  cup (60 mg).  Endive, raw, cup (80 mg).  English muffin, 1 each (65 mg).  Fish, orange roughy, 3 oz (150 mg).  Frankfurter, beef or pork, 1 each (75 mg).  Fruit cocktail,  cup (115 mg).  Grape juice,  cup (170 mg).  Grapefruit,  fruit (175 mg).  Grapes,  cup (155 mg).  Greens: kale, turnip, collard,  cup (110-150 mg).  Ice cream or frozen yogurt, chocolate,  cup (175 mg).  Ice cream or frozen yogurt, vanilla,  cup (120-150 mg).  Lemons, limes, 1 each (80 mg).  Lettuce, all types, 1 cup (100 mg).  Mixed vegetables,  cup (150 mg).  Mushrooms, raw,  cup (110 mg).  Nuts: walnuts, pecans, or macadamia, 1 oz (125 mg).  Oatmeal,  cup (80 mg).  Okra,  cup (110 mg).  Onions, raw,  cup (120 mg).  Peach, 1 each (185 mg).  Peaches, canned,  cup (120 mg).  Pears, canned,  cup (120 mg).  Peas, green, frozen,  cup (90 mg).  Peppers, green,  cup (130 mg).  Peppers, red,  cup (160 mg).  Pineapple juice,  cup (165 mg).  Pineapple, fresh or canned,  cup (100 mg).  Plums, 1 each (105 mg).  Pudding, vanilla,  cup (150 mg).  Raspberries,  cup (90 mg).  Rhubarb,  cup (115 mg).  Rice, wild,  cup (80 mg).  Shrimp, 3 oz (155 mg).  Spinach, raw, 1 cup (170 mg).  Strawberries,  cup (125 mg).  Summer squash  cup (175-200 mg).  Swiss chard, raw, 1 cup (135 mg).  Tangerines, 1 each (140 mg).  Tea, brewed, 6 oz (65  mg).  Turnips,  cup (140 mg).  Watermelon,  cup (85 mg).  Wine, red, table, 5 oz (180 mg).  Wine, white, table, 5 oz (100 mg). LOW IN POTASSIUM The following foods and beverages have less than 50 mg of potassium per serving.  Bread, white, 1 slice (30 mg).  Carbonated beverages, 12 oz (less than 5 mg).  Cheese, 1 oz (20-30 mg).  Cranberries,  cup (45 mg).  Cranberry juice cocktail,  cup (20 mg).  Fats and oils, 1 Tbsp (less than 5 mg).  Hummus, 1 Tbsp (32 mg).  Nectar: papaya, mango, or pear,  cup (35 mg).  Rice, white or brown,  cup (50 mg).  Spaghetti or macaroni,  cup cooked (30 mg).  Tortilla, flour or corn, 1 each (50 mg).  Waffle, 4 in., 1 each (50 mg).  Water chestnuts,  cup (40 mg). Document Released: 11/17/2004 Document Revised: 04/10/2013 Document Reviewed: 03/02/2013 [facility] Patient Information 2015 [place], [place]. This information is not intended to replace advice given to you by your health care provider. Make sure you discuss any questions you have with your health care provider.  Nausea and Vomiting Nausea means  you feel sick to your stomach. Throwing up (vomiting) is a reflex where stomach contents come out of your mouth. HOME CARE   Take medicine as told by your doctor.  Do not force yourself to eat. However, you do need to drink fluids.  If you feel like eating, eat a normal diet as told by your doctor.  Eat rice, wheat, potatoes, bread, lean meats, yogurt, fruits, and vegetables.  Avoid high-fat foods.  Drink enough fluids to keep your pee (urine) clear or pale yellow.  Ask your doctor how to replace body fluid losses (rehydrate). Signs of body fluid loss (dehydration) include:  Feeling very thirsty.  Dry lips and mouth.  Feeling dizzy.  Dark pee.  Peeing less than normal.  Feeling confused.  Fast breathing or heart rate. GET HELP RIGHT AWAY IF:   You have blood in your throw up.  You have black or bloody poop  (stool).  You have a bad headache or stiff neck.  You feel confused.  You have bad belly (abdominal) pain.  You have chest pain or trouble breathing.  You do not pee at least once every 8 hours.  You have cold, clammy skin.  You keep throwing up after 24 to 48 hours.  You have a fever. MAKE SURE YOU:   Understand these instructions.  Will watch your condition.  Will get help right away if you are not doing well or get worse. Document Released: 09/22/2007 Document Revised: 06/28/2011 Document Reviewed: 09/04/2010 [facility] Patient Information 2015 [place], [place]. This information is not intended to replace advice given to you by your health care provider. Make sure you discuss any questions you have with your health care provider.

## 2014-05-26 NOTE — ED Notes (Signed)
Bed: WA07 Expected date: 05/26/14 Expected time: 11:22 AM Means of arrival: Ambulance Comments: N/V/D since Friday

## 2014-05-26 NOTE — ED Notes (Signed)
Stool card collected and to mini-lab for occult blood test.

## 2014-05-26 NOTE — ED Notes (Signed)
Patient aware that a urine sample is needed. Patient will let staff know when she is able to.

## 2014-05-26 NOTE — ED Notes (Signed)
Patient drank contrast, no emesis at present.

## 2014-05-29 DIAGNOSIS — I10 Essential (primary) hypertension: Secondary | ICD-10-CM | POA: Diagnosis not present

## 2014-05-29 DIAGNOSIS — I5022 Chronic systolic (congestive) heart failure: Secondary | ICD-10-CM | POA: Diagnosis not present

## 2014-05-29 DIAGNOSIS — F419 Anxiety disorder, unspecified: Secondary | ICD-10-CM | POA: Diagnosis not present

## 2014-05-29 DIAGNOSIS — M25579 Pain in unspecified ankle and joints of unspecified foot: Secondary | ICD-10-CM | POA: Diagnosis not present

## 2014-05-31 DIAGNOSIS — D649 Anemia, unspecified: Secondary | ICD-10-CM | POA: Diagnosis not present

## 2014-06-06 ENCOUNTER — Encounter (HOSPITAL_COMMUNITY): Payer: Self-pay | Admitting: *Deleted

## 2014-06-06 MED ORDER — CEFAZOLIN SODIUM-DEXTROSE 2-3 GM-% IV SOLR
2.0000 g | INTRAVENOUS | Status: AC
  Administered 2014-06-07 (×2): 2 g via INTRAVENOUS

## 2014-06-07 ENCOUNTER — Inpatient Hospital Stay (HOSPITAL_COMMUNITY): Payer: Commercial Managed Care - HMO

## 2014-06-07 ENCOUNTER — Encounter (HOSPITAL_COMMUNITY): Payer: Self-pay | Admitting: Anesthesiology

## 2014-06-07 ENCOUNTER — Inpatient Hospital Stay (HOSPITAL_COMMUNITY): Payer: Commercial Managed Care - HMO | Admitting: Anesthesiology

## 2014-06-07 ENCOUNTER — Inpatient Hospital Stay (HOSPITAL_COMMUNITY)
Admission: RE | Admit: 2014-06-07 | Discharge: 2014-06-11 | DRG: 460 | Disposition: A | Discharge status: Skilled Nursing Facility | Payer: Commercial Managed Care - HMO | Source: Ambulatory Visit | Attending: Neurosurgery | Admitting: Neurosurgery

## 2014-06-07 ENCOUNTER — Encounter (HOSPITAL_COMMUNITY)
Admission: RE | Disposition: A | Discharge status: Skilled Nursing Facility | Payer: Medicare Other | Source: Ambulatory Visit | Attending: Neurosurgery

## 2014-06-07 ENCOUNTER — Inpatient Hospital Stay (HOSPITAL_COMMUNITY): Payer: Commercial Managed Care - HMO | Admitting: Emergency Medicine

## 2014-06-07 DIAGNOSIS — Z4789 Encounter for other orthopedic aftercare: Secondary | ICD-10-CM | POA: Diagnosis not present

## 2014-06-07 DIAGNOSIS — M4317 Spondylolisthesis, lumbosacral region: Secondary | ICD-10-CM | POA: Diagnosis not present

## 2014-06-07 DIAGNOSIS — I10 Essential (primary) hypertension: Secondary | ICD-10-CM | POA: Diagnosis not present

## 2014-06-07 DIAGNOSIS — M4316 Spondylolisthesis, lumbar region: Secondary | ICD-10-CM | POA: Diagnosis not present

## 2014-06-07 DIAGNOSIS — M5489 Other dorsalgia: Secondary | ICD-10-CM | POA: Diagnosis present

## 2014-06-07 DIAGNOSIS — Z6836 Body mass index (BMI) 36.0-36.9, adult: Secondary | ICD-10-CM | POA: Diagnosis not present

## 2014-06-07 DIAGNOSIS — M797 Fibromyalgia: Secondary | ICD-10-CM | POA: Diagnosis not present

## 2014-06-07 DIAGNOSIS — R278 Other lack of coordination: Secondary | ICD-10-CM | POA: Diagnosis not present

## 2014-06-07 DIAGNOSIS — E876 Hypokalemia: Secondary | ICD-10-CM | POA: Diagnosis not present

## 2014-06-07 DIAGNOSIS — R2681 Unsteadiness on feet: Secondary | ICD-10-CM | POA: Diagnosis not present

## 2014-06-07 DIAGNOSIS — D649 Anemia, unspecified: Secondary | ICD-10-CM | POA: Diagnosis not present

## 2014-06-07 DIAGNOSIS — Q7649 Other congenital malformations of spine, not associated with scoliosis: Secondary | ICD-10-CM | POA: Diagnosis not present

## 2014-06-07 DIAGNOSIS — M4806 Spinal stenosis, lumbar region: Secondary | ICD-10-CM | POA: Diagnosis present

## 2014-06-07 DIAGNOSIS — M4807 Spinal stenosis, lumbosacral region: Secondary | ICD-10-CM | POA: Diagnosis not present

## 2014-06-07 DIAGNOSIS — M5417 Radiculopathy, lumbosacral region: Secondary | ICD-10-CM | POA: Diagnosis not present

## 2014-06-07 DIAGNOSIS — M5416 Radiculopathy, lumbar region: Secondary | ICD-10-CM | POA: Diagnosis not present

## 2014-06-07 DIAGNOSIS — Z86718 Personal history of other venous thrombosis and embolism: Secondary | ICD-10-CM

## 2014-06-07 DIAGNOSIS — Z9889 Other specified postprocedural states: Secondary | ICD-10-CM | POA: Diagnosis not present

## 2014-06-07 DIAGNOSIS — M4326 Fusion of spine, lumbar region: Secondary | ICD-10-CM | POA: Diagnosis not present

## 2014-06-07 DIAGNOSIS — M545 Low back pain: Secondary | ICD-10-CM | POA: Diagnosis not present

## 2014-06-07 DIAGNOSIS — M4800 Spinal stenosis, site unspecified: Secondary | ICD-10-CM | POA: Diagnosis not present

## 2014-06-07 DIAGNOSIS — M6281 Muscle weakness (generalized): Secondary | ICD-10-CM | POA: Diagnosis not present

## 2014-06-07 HISTORY — PX: MAXIMUM ACCESS (MAS)POSTERIOR LUMBAR INTERBODY FUSION (PLIF) 2 LEVEL: SHX6369

## 2014-06-07 LAB — TYPE AND SCREEN
ABO/RH(D): O NEG
Antibody Screen: NEGATIVE

## 2014-06-07 LAB — POCT I-STAT 4, (NA,K, GLUC, HGB,HCT)
Glucose, Bld: 77 mg/dL (ref 70–99)
HEMATOCRIT: 36 % (ref 36.0–46.0)
HEMOGLOBIN: 12.2 g/dL (ref 12.0–15.0)
Potassium: 3.4 mmol/L — ABNORMAL LOW (ref 3.5–5.1)
SODIUM: 146 mmol/L — AB (ref 135–145)

## 2014-06-07 LAB — GLUCOSE, CAPILLARY: GLUCOSE-CAPILLARY: 104 mg/dL — AB (ref 70–99)

## 2014-06-07 SURGERY — FOR MAXIMUM ACCESS (MAS) POSTERIOR LUMBAR INTERBODY FUSION (PLIF) 2 LEVEL
Anesthesia: General | Site: Back

## 2014-06-07 MED ORDER — GABAPENTIN 400 MG PO CAPS
400.0000 mg | ORAL_CAPSULE | Freq: Four times a day (QID) | ORAL | Status: DC
  Administered 2014-06-07 – 2014-06-11 (×12): 400 mg via ORAL
  Filled 2014-06-07 (×8): qty 1
  Filled 2014-06-07: qty 4
  Filled 2014-06-07 (×3): qty 1

## 2014-06-07 MED ORDER — POTASSIUM CHLORIDE CRYS ER 20 MEQ PO TBCR
20.0000 meq | EXTENDED_RELEASE_TABLET | Freq: Once | ORAL | Status: DC
  Filled 2014-06-07: qty 1

## 2014-06-07 MED ORDER — SODIUM CHLORIDE 0.9 % IV SOLN: 250.0000 mL | INTRAVENOUS | Status: DC

## 2014-06-07 MED ORDER — SODIUM CHLORIDE 0.9 % IJ SOLN
3.0000 mL | Freq: Two times a day (BID) | INTRAMUSCULAR | Status: DC
  Administered 2014-06-07 – 2014-06-10 (×6): 3 mL via INTRAVENOUS

## 2014-06-07 MED ORDER — LIDOCAINE-EPINEPHRINE 1 %-1:100000 IJ SOLN
INTRAMUSCULAR | Status: DC | PRN
  Administered 2014-06-07: 5 mL

## 2014-06-07 MED ORDER — SENNA 8.6 MG PO TABS
1.0000 | ORAL_TABLET | Freq: Two times a day (BID) | ORAL | Status: DC
  Administered 2014-06-07 – 2014-06-10 (×7): 8.6 mg via ORAL
  Filled 2014-06-07 (×8): qty 1

## 2014-06-07 MED ORDER — BUPIVACAINE LIPOSOME 1.3 % IJ SUSP
20.0000 mL | INTRAMUSCULAR | Status: DC
  Filled 2014-06-07: qty 20

## 2014-06-07 MED ORDER — HYDROMORPHONE HCL 1 MG/ML IJ SOLN
INTRAMUSCULAR | Status: AC
  Filled 2014-06-07: qty 1

## 2014-06-07 MED ORDER — CALCIUM 600 MG PO TABS
600.0000 mg | ORAL_TABLET | Freq: Every day | ORAL | Status: DC
Start: 2014-06-08 — End: 2014-06-07
  Filled 2014-06-07: qty 1

## 2014-06-07 MED ORDER — NYSTATIN-TRIAMCINOLONE 100000-0.1 UNIT/GM-% EX CREA
1.0000 "application " | TOPICAL_CREAM | Freq: Two times a day (BID) | CUTANEOUS | Status: DC
  Administered 2014-06-07 – 2014-06-11 (×5): 1 via TOPICAL
  Filled 2014-06-07 (×2): qty 15

## 2014-06-07 MED ORDER — SUCCINYLCHOLINE CHLORIDE 20 MG/ML IJ SOLN
INTRAMUSCULAR | Status: DC | PRN
  Administered 2014-06-07: 100 mg via INTRAVENOUS

## 2014-06-07 MED ORDER — PHENYLEPHRINE HCL 10 MG/ML IJ SOLN
INTRAMUSCULAR | Status: DC | PRN
  Administered 2014-06-07 (×3): 80 ug via INTRAVENOUS
  Administered 2014-06-07: 40 ug via INTRAVENOUS
  Administered 2014-06-07: 80 ug via INTRAVENOUS

## 2014-06-07 MED ORDER — ONDANSETRON HCL 4 MG/2ML IJ SOLN
4.0000 mg | INTRAMUSCULAR | Status: DC | PRN
  Administered 2014-06-08: 4 mg via INTRAVENOUS
  Filled 2014-06-07: qty 2

## 2014-06-07 MED ORDER — ASPIRIN EC 81 MG PO TBEC
81.0000 mg | DELAYED_RELEASE_TABLET | Freq: Every day | ORAL | Status: DC
  Administered 2014-06-08 – 2014-06-11 (×4): 81 mg via ORAL
  Filled 2014-06-07 (×5): qty 1

## 2014-06-07 MED ORDER — LACTATED RINGERS IV SOLN
INTRAVENOUS | Status: DC | PRN
  Administered 2014-06-07: 11:00:00 via INTRAVENOUS

## 2014-06-07 MED ORDER — PROMETHAZINE HCL 25 MG/ML IJ SOLN: 6.2500 mg | INTRAMUSCULAR | Status: DC | PRN

## 2014-06-07 MED ORDER — ARTIFICIAL TEARS OP OINT
TOPICAL_OINTMENT | OPHTHALMIC | Status: DC | PRN
  Administered 2014-06-07: 1 via OPHTHALMIC

## 2014-06-07 MED ORDER — PHENOL 1.4 % MT LIQD: 1.0000 | OROMUCOSAL | Status: DC | PRN

## 2014-06-07 MED ORDER — ACETAMINOPHEN 650 MG RE SUPP: 650.0000 mg | RECTAL | Status: DC | PRN

## 2014-06-07 MED ORDER — FENTANYL CITRATE 0.05 MG/ML IJ SOLN
INTRAMUSCULAR | Status: DC | PRN
  Administered 2014-06-07 (×7): 50 ug via INTRAVENOUS
  Administered 2014-06-07: 100 ug via INTRAVENOUS
  Administered 2014-06-07: 50 ug via INTRAVENOUS

## 2014-06-07 MED ORDER — LIDOCAINE HCL (CARDIAC) 20 MG/ML IV SOLN
INTRAVENOUS | Status: DC | PRN
  Administered 2014-06-07: 70 mg via INTRAVENOUS
  Administered 2014-06-07: 100 mg via INTRATRACHEAL

## 2014-06-07 MED ORDER — BUPIVACAINE LIPOSOME 1.3 % IJ SUSP
INTRAMUSCULAR | Status: DC | PRN
  Administered 2014-06-07: 20 mL

## 2014-06-07 MED ORDER — METHOCARBAMOL 1000 MG/10ML IJ SOLN
500.0000 mg | Freq: Four times a day (QID) | INTRAVENOUS | Status: DC | PRN
  Filled 2014-06-07: qty 5

## 2014-06-07 MED ORDER — DEXAMETHASONE SODIUM PHOSPHATE 10 MG/ML IJ SOLN
INTRAMUSCULAR | Status: DC | PRN
  Administered 2014-06-07: 10 mg via INTRAVENOUS

## 2014-06-07 MED ORDER — HYDROMORPHONE HCL 1 MG/ML IJ SOLN
0.5000 mg | INTRAMUSCULAR | Status: AC | PRN
  Administered 2014-06-07 (×2): 0.5 mg via INTRAVENOUS

## 2014-06-07 MED ORDER — VITAMIN D3 25 MCG (1000 UNIT) PO TABS
1000.0000 [IU] | ORAL_TABLET | Freq: Every day | ORAL | Status: DC
  Administered 2014-06-07 – 2014-06-11 (×5): 1000 [IU] via ORAL
  Filled 2014-06-07 (×9): qty 1

## 2014-06-07 MED ORDER — OXYCODONE-ACETAMINOPHEN 5-325 MG PO TABS
1.0000 | ORAL_TABLET | ORAL | Status: DC | PRN
  Administered 2014-06-09 – 2014-06-11 (×5): 2 via ORAL
  Filled 2014-06-07 (×5): qty 2

## 2014-06-07 MED ORDER — PANTOPRAZOLE SODIUM 40 MG IV SOLR
40.0000 mg | Freq: Every day | INTRAVENOUS | Status: DC
  Administered 2014-06-07: 40 mg via INTRAVENOUS
  Filled 2014-06-07: qty 40

## 2014-06-07 MED ORDER — DOCUSATE SODIUM 100 MG PO CAPS
100.0000 mg | ORAL_CAPSULE | Freq: Two times a day (BID) | ORAL | Status: DC
  Administered 2014-06-07 – 2014-06-10 (×7): 100 mg via ORAL
  Filled 2014-06-07 (×8): qty 1

## 2014-06-07 MED ORDER — HYDROMORPHONE HCL 1 MG/ML IJ SOLN
0.2500 mg | INTRAMUSCULAR | Status: DC | PRN
  Administered 2014-06-07 (×4): 0.5 mg via INTRAVENOUS

## 2014-06-07 MED ORDER — LACTATED RINGERS IV SOLN: INTRAVENOUS | Status: DC | PRN

## 2014-06-07 MED ORDER — HYDROCODONE-ACETAMINOPHEN 5-325 MG PO TABS
1.0000 | ORAL_TABLET | Freq: Four times a day (QID) | ORAL | Status: DC | PRN
  Filled 2014-06-07: qty 1

## 2014-06-07 MED ORDER — ALBUMIN HUMAN 5 % IV SOLN
INTRAVENOUS | Status: DC | PRN
  Administered 2014-06-07 (×2): via INTRAVENOUS

## 2014-06-07 MED ORDER — LACTATED RINGERS IV SOLN
INTRAVENOUS | Status: DC
  Administered 2014-06-07 (×3): via INTRAVENOUS

## 2014-06-07 MED ORDER — TRAMADOL HCL 50 MG PO TABS
50.0000 mg | ORAL_TABLET | Freq: Four times a day (QID) | ORAL | Status: DC
  Administered 2014-06-07 – 2014-06-11 (×12): 50 mg via ORAL
  Filled 2014-06-07 (×13): qty 1

## 2014-06-07 MED ORDER — FUROSEMIDE 40 MG PO TABS: 40.0000 mg | ORAL_TABLET | Freq: Every day | ORAL | Status: DC | PRN

## 2014-06-07 MED ORDER — HYDROCODONE-ACETAMINOPHEN 5-325 MG PO TABS
1.0000 | ORAL_TABLET | ORAL | Status: DC | PRN
  Administered 2014-06-08: 2 via ORAL
  Administered 2014-06-08: 1 via ORAL
  Administered 2014-06-09: 2 via ORAL
  Filled 2014-06-07: qty 1
  Filled 2014-06-07 (×2): qty 2

## 2014-06-07 MED ORDER — ADULT MULTIVITAMIN W/MINERALS CH
1.0000 | ORAL_TABLET | Freq: Every day | ORAL | Status: DC
  Administered 2014-06-08 – 2014-06-11 (×4): 1 via ORAL
  Filled 2014-06-07 (×8): qty 1

## 2014-06-07 MED ORDER — SODIUM CHLORIDE 0.9 % IV SOLN
INTRAVENOUS | Status: DC | PRN
  Administered 2014-06-07: 14:00:00 via INTRAVENOUS

## 2014-06-07 MED ORDER — ACETAMINOPHEN 10 MG/ML IV SOLN
1000.0000 mg | INTRAVENOUS | Status: AC
  Administered 2014-06-07: 1000 mg via INTRAVENOUS
  Filled 2014-06-07: qty 100

## 2014-06-07 MED ORDER — 0.9 % SODIUM CHLORIDE (POUR BTL) OPTIME
TOPICAL | Status: DC | PRN
  Administered 2014-06-07: 1000 mL

## 2014-06-07 MED ORDER — ACETAMINOPHEN 325 MG PO TABS: 650.0000 mg | ORAL_TABLET | ORAL | Status: DC | PRN

## 2014-06-07 MED ORDER — ALUM & MAG HYDROXIDE-SIMETH 200-200-20 MG/5ML PO SUSP: 30.0000 mL | Freq: Four times a day (QID) | ORAL | Status: DC | PRN

## 2014-06-07 MED ORDER — MIDAZOLAM HCL 5 MG/5ML IJ SOLN
INTRAMUSCULAR | Status: DC | PRN
  Administered 2014-06-07 (×2): 1 mg via INTRAVENOUS

## 2014-06-07 MED ORDER — VITAMIN C 500 MG PO TABS
500.0000 mg | ORAL_TABLET | Freq: Every day | ORAL | Status: DC
  Administered 2014-06-08 – 2014-06-11 (×4): 500 mg via ORAL
  Filled 2014-06-07 (×5): qty 1

## 2014-06-07 MED ORDER — POTASSIUM CHLORIDE CRYS ER 20 MEQ PO TBCR
20.0000 meq | EXTENDED_RELEASE_TABLET | Freq: Two times a day (BID) | ORAL | Status: DC
  Administered 2014-06-08 – 2014-06-11 (×7): 20 meq via ORAL
  Filled 2014-06-07 (×7): qty 1

## 2014-06-07 MED ORDER — HYDROMORPHONE HCL 1 MG/ML IJ SOLN
0.5000 mg | INTRAMUSCULAR | Status: DC | PRN
  Administered 2014-06-07 – 2014-06-10 (×15): 1 mg via INTRAVENOUS
  Filled 2014-06-07 (×16): qty 1

## 2014-06-07 MED ORDER — CEFAZOLIN SODIUM 1-5 GM-% IV SOLN
1.0000 g | Freq: Three times a day (TID) | INTRAVENOUS | Status: AC
  Administered 2014-06-07 – 2014-06-08 (×2): 1 g via INTRAVENOUS
  Filled 2014-06-07 (×2): qty 50

## 2014-06-07 MED ORDER — METOPROLOL TARTRATE 12.5 MG HALF TABLET
12.5000 mg | ORAL_TABLET | Freq: Two times a day (BID) | ORAL | Status: DC
  Administered 2014-06-07 – 2014-06-10 (×4): 12.5 mg via ORAL
  Filled 2014-06-07 (×5): qty 1

## 2014-06-07 MED ORDER — PHENYLEPHRINE HCL 10 MG/ML IJ SOLN
10.0000 mg | INTRAMUSCULAR | Status: DC | PRN
  Administered 2014-06-07: 20 ug/min via INTRAVENOUS

## 2014-06-07 MED ORDER — ONDANSETRON 4 MG PO TBDP: 4.0000 mg | ORAL_TABLET | Freq: Three times a day (TID) | ORAL | Status: DC | PRN

## 2014-06-07 MED ORDER — CITALOPRAM HYDROBROMIDE 10 MG PO TABS
20.0000 mg | ORAL_TABLET | Freq: Every morning | ORAL | Status: DC
  Administered 2014-06-08 – 2014-06-11 (×4): 20 mg via ORAL
  Filled 2014-06-07 (×4): qty 2

## 2014-06-07 MED ORDER — BUPIVACAINE HCL (PF) 0.5 % IJ SOLN
INTRAMUSCULAR | Status: DC | PRN
  Administered 2014-06-07: 5 mL

## 2014-06-07 MED ORDER — METHOCARBAMOL 500 MG PO TABS
500.0000 mg | ORAL_TABLET | Freq: Four times a day (QID) | ORAL | Status: DC | PRN
  Administered 2014-06-08 – 2014-06-11 (×7): 500 mg via ORAL
  Filled 2014-06-07 (×8): qty 1

## 2014-06-07 MED ORDER — METOCLOPRAMIDE HCL 5 MG PO TABS: 5.0000 mg | ORAL_TABLET | Freq: Four times a day (QID) | ORAL | Status: DC | PRN

## 2014-06-07 MED ORDER — PROPOFOL 10 MG/ML IV BOLUS
INTRAVENOUS | Status: DC | PRN
  Administered 2014-06-07: 170 mg via INTRAVENOUS

## 2014-06-07 MED ORDER — MENTHOL 3 MG MT LOZG
1.0000 | LOZENGE | OROMUCOSAL | Status: DC | PRN
Start: 2014-06-07 — End: 2014-06-11

## 2014-06-07 MED ORDER — PROMETHAZINE HCL 25 MG PO TABS: 25.0000 mg | ORAL_TABLET | Freq: Four times a day (QID) | ORAL | Status: DC | PRN

## 2014-06-07 MED ORDER — THROMBIN 20000 UNITS EX SOLR
CUTANEOUS | Status: DC | PRN
  Administered 2014-06-07: 20 mL via TOPICAL

## 2014-06-07 MED ORDER — TOPIRAMATE 25 MG PO TABS
50.0000 mg | ORAL_TABLET | Freq: Two times a day (BID) | ORAL | Status: DC
  Administered 2014-06-07 – 2014-06-11 (×8): 50 mg via ORAL
  Filled 2014-06-07 (×8): qty 2

## 2014-06-07 MED ORDER — PROPOFOL INFUSION 10 MG/ML OPTIME
INTRAVENOUS | Status: DC | PRN
  Administered 2014-06-07: 50 ug/kg/min via INTRAVENOUS

## 2014-06-07 MED ORDER — KCL IN DEXTROSE-NACL 20-5-0.45 MEQ/L-%-% IV SOLN
INTRAVENOUS | Status: DC
  Administered 2014-06-07: 19:00:00 via INTRAVENOUS
  Filled 2014-06-07 (×5): qty 1000

## 2014-06-07 MED ORDER — SODIUM CHLORIDE 0.9 % IJ SOLN: 3.0000 mL | INTRAMUSCULAR | Status: DC | PRN

## 2014-06-07 MED ORDER — NITROGLYCERIN 0.4 MG SL SUBL: 0.4000 mg | SUBLINGUAL_TABLET | SUBLINGUAL | Status: DC | PRN

## 2014-06-07 MED ORDER — FERROUS SULFATE 325 (65 FE) MG PO TABS
325.0000 mg | ORAL_TABLET | Freq: Every day | ORAL | Status: DC
  Administered 2014-06-08 – 2014-06-11 (×4): 325 mg via ORAL
  Filled 2014-06-07 (×5): qty 1

## 2014-06-07 MED ORDER — ONDANSETRON HCL 4 MG/2ML IJ SOLN
INTRAMUSCULAR | Status: DC | PRN
  Administered 2014-06-07 (×2): 4 mg via INTRAVENOUS

## 2014-06-07 MED ORDER — CALCIUM CARBONATE 1250 (500 CA) MG PO TABS
1.0000 | ORAL_TABLET | Freq: Every day | ORAL | Status: DC
  Administered 2014-06-08 – 2014-06-11 (×4): 500 mg via ORAL
  Filled 2014-06-07 (×5): qty 1

## 2014-06-07 SURGICAL SUPPLY — 90 items
APL SKNCLS STERI-STRIP NONHPOA (GAUZE/BANDAGES/DRESSINGS) ×1
BENZOIN TINCTURE PRP APPL 2/3 (GAUZE/BANDAGES/DRESSINGS) ×1 IMPLANT
BLADE CLIPPER SURG (BLADE) IMPLANT
BONE MATRIX OSTEOCEL PRO MED (Bone Implant) ×2 IMPLANT
BUR MATCHSTICK NEURO 3.0 LAGG (BURR) ×2 IMPLANT
BUR PRECISION FLUTE 5.0 (BURR) ×2 IMPLANT
BUR ROUND FLUTED 5 RND (BURR) IMPLANT
CAGE COROENT LG 10X9X23-12 (Cage) ×2 IMPLANT
CANISTER SUCT 3000ML PPV (MISCELLANEOUS) ×2 IMPLANT
CLIP NEUROVISION LG (CLIP) ×1 IMPLANT
CONT SPEC 4OZ CLIKSEAL STRL BL (MISCELLANEOUS) ×4 IMPLANT
COVER BACK TABLE 24X17X13 BIG (DRAPES) IMPLANT
COVER BACK TABLE 60X90IN (DRAPES) ×2 IMPLANT
DECANTER SPIKE VIAL GLASS SM (MISCELLANEOUS) ×2 IMPLANT
DRAPE C-ARM 42X72 X-RAY (DRAPES) ×2 IMPLANT
DRAPE C-ARMOR (DRAPES) ×2 IMPLANT
DRAPE LAPAROTOMY 100X72X124 (DRAPES) ×2 IMPLANT
DRAPE POUCH INSTRU U-SHP 10X18 (DRAPES) ×2 IMPLANT
DRAPE SURG 17X23 STRL (DRAPES) ×2 IMPLANT
DRSG OPSITE 4X5.5 SM (GAUZE/BANDAGES/DRESSINGS) ×1 IMPLANT
DRSG OPSITE POSTOP 4X8 (GAUZE/BANDAGES/DRESSINGS) ×1 IMPLANT
DRSG TELFA 3X8 NADH (GAUZE/BANDAGES/DRESSINGS) IMPLANT
DURAPREP 26ML APPLICATOR (WOUND CARE) ×2 IMPLANT
ELECT REM PT RETURN 9FT ADLT (ELECTROSURGICAL) ×2
ELECTRODE REM PT RTRN 9FT ADLT (ELECTROSURGICAL) ×1 IMPLANT
EVACUATOR 1/8 PVC DRAIN (DRAIN) IMPLANT
GAUZE SPONGE 4X4 12PLY STRL (GAUZE/BANDAGES/DRESSINGS) ×2 IMPLANT
GAUZE SPONGE 4X4 16PLY XRAY LF (GAUZE/BANDAGES/DRESSINGS) IMPLANT
GLOVE BIO SURGEON STRL SZ8 (GLOVE) ×3 IMPLANT
GLOVE BIOGEL PI IND STRL 7.5 (GLOVE) IMPLANT
GLOVE BIOGEL PI IND STRL 8 (GLOVE) ×2 IMPLANT
GLOVE BIOGEL PI IND STRL 8.5 (GLOVE) ×2 IMPLANT
GLOVE BIOGEL PI INDICATOR 7.5 (GLOVE) ×2
GLOVE BIOGEL PI INDICATOR 8 (GLOVE) ×1
GLOVE BIOGEL PI INDICATOR 8.5 (GLOVE) ×1
GLOVE ECLIPSE 7.5 STRL STRAW (GLOVE) ×4 IMPLANT
GLOVE ECLIPSE 8.0 STRL XLNG CF (GLOVE) ×4 IMPLANT
GLOVE EXAM NITRILE LRG STRL (GLOVE) IMPLANT
GLOVE EXAM NITRILE MD LF STRL (GLOVE) IMPLANT
GLOVE EXAM NITRILE XL STR (GLOVE) IMPLANT
GLOVE EXAM NITRILE XS STR PU (GLOVE) IMPLANT
GOWN STRL REUS W/ TWL LRG LVL3 (GOWN DISPOSABLE) IMPLANT
GOWN STRL REUS W/ TWL XL LVL3 (GOWN DISPOSABLE) ×3 IMPLANT
GOWN STRL REUS W/TWL 2XL LVL3 (GOWN DISPOSABLE) ×1 IMPLANT
GOWN STRL REUS W/TWL LRG LVL3 (GOWN DISPOSABLE)
GOWN STRL REUS W/TWL XL LVL3 (GOWN DISPOSABLE) ×6
KIT BASIN OR (CUSTOM PROCEDURE TRAY) ×2 IMPLANT
KIT NDL NVM5 EMG ELECT (KITS) IMPLANT
KIT NEEDLE NVM5 EMG ELECT (KITS) ×2 IMPLANT
KIT NEEDLE NVM5 EMG ELECTRODE (KITS) ×2
KIT POSITION SURG JACKSON T1 (MISCELLANEOUS) ×2 IMPLANT
KIT ROOM TURNOVER OR (KITS) ×2 IMPLANT
LIQUID BAND (GAUZE/BANDAGES/DRESSINGS) ×2 IMPLANT
MILL MEDIUM DISP (BLADE) ×2 IMPLANT
NDL HYPO 25X1 1.5 SAFETY (NEEDLE) ×1 IMPLANT
NDL SPNL 18GX3.5 QUINCKE PK (NEEDLE) IMPLANT
NEEDLE HYPO 25X1 1.5 SAFETY (NEEDLE) ×2 IMPLANT
NEEDLE SPNL 18GX3.5 QUINCKE PK (NEEDLE) IMPLANT
NS IRRIG 1000ML POUR BTL (IV SOLUTION) ×2 IMPLANT
PACK LAMINECTOMY NEURO (CUSTOM PROCEDURE TRAY) ×2 IMPLANT
PAD ARMBOARD 7.5X6 YLW CONV (MISCELLANEOUS) ×6 IMPLANT
PAD DRESSING TELFA 3X8 NADH (GAUZE/BANDAGES/DRESSINGS) IMPLANT
PATTIES SURGICAL .5 X.5 (GAUZE/BANDAGES/DRESSINGS) IMPLANT
PATTIES SURGICAL .5 X1 (DISPOSABLE) IMPLANT
PATTIES SURGICAL 1X1 (DISPOSABLE) IMPLANT
ROD 60MM LUMBAR (Rod) ×1 IMPLANT
ROD PLIF MAS 65MM (Rod) ×1 IMPLANT
SCREW LOCK (Screw) ×12 IMPLANT
SCREW LOCK FXNS SPNE MAS PL (Screw) IMPLANT
SCREW PLIF MAS 5.5X35 LUMBAR (Screw) ×2 IMPLANT
SCREW SHANK 5.0X30MM (Screw) ×2 IMPLANT
SCREW SHANK 6.5X30 (Screw) ×2 IMPLANT
SCREW TULIP 5.5 (Screw) ×4 IMPLANT
SPONGE LAP 4X18 X RAY DECT (DISPOSABLE) IMPLANT
SPONGE SURGIFOAM ABS GEL 100 (HEMOSTASIS) ×2 IMPLANT
STAPLER SKIN PROX WIDE 3.9 (STAPLE) IMPLANT
STRIP CLOSURE SKIN 1/2X4 (GAUZE/BANDAGES/DRESSINGS) ×2 IMPLANT
SUT VIC AB 1 CT1 18XBRD ANBCTR (SUTURE) ×2 IMPLANT
SUT VIC AB 1 CT1 8-18 (SUTURE) ×4
SUT VIC AB 2-0 CT1 18 (SUTURE) ×4 IMPLANT
SUT VIC AB 3-0 SH 8-18 (SUTURE) ×4 IMPLANT
SYR 20ML ECCENTRIC (SYRINGE) ×2 IMPLANT
SYR 3ML LL SCALE MARK (SYRINGE) ×8 IMPLANT
SYR 5ML LL (SYRINGE) IMPLANT
TAPE STRIPS DRAPE STRL (GAUZE/BANDAGES/DRESSINGS) ×1 IMPLANT
TOWEL OR 17X24 6PK STRL BLUE (TOWEL DISPOSABLE) ×2 IMPLANT
TOWEL OR 17X26 10 PK STRL BLUE (TOWEL DISPOSABLE) ×2 IMPLANT
TRAP SPECIMEN MUCOUS 40CC (MISCELLANEOUS) ×2 IMPLANT
TRAY FOLEY CATH 14FRSI W/METER (CATHETERS) ×2 IMPLANT
WATER STERILE IRR 1000ML POUR (IV SOLUTION) ×2 IMPLANT

## 2014-06-07 NOTE — Op Note (Signed)
06/07/2014  3:04 PM  PATIENT:  [name]  59 y.o. female  PRE-OPERATIVE DIAGNOSIS:  Spondylolisthesis, Lumbar stenosis, Low back pain, Lumbar radiculopathy L 45 and L 5 S 1 levels  POST-OPERATIVE DIAGNOSIS:  Spondylolisthesis, Lumbar stenosis, Low back pain, Lumbar radiculopathy L 45 and L 5 S 1 levels  PROCEDURE:  Procedure(s) with comments: L4-5 L5-S1 FOR MAXIMUM ACCESS (MAS) POSTERIOR LUMBAR INTERBODY FUSION  (N/A) - L4-5 L5-S1 FOR MAXIMUM ACCESS (MAS) POSTERIOR LUMBAR INTERBODY FUSION  L 45 and L 5 S 1 fusion with pedicle screw fixation and posterolateral arthrodesis  SURGEON:  Surgeon(s) and Role:    * Erline Levine, MD - Primary    * Faythe Ghee, MD - Assisting  PHYSICIAN ASSISTANT:   ASSISTANTS: Poteat, RN   ANESTHESIA:   general  EBL:  Total I/O In: 1660 [I.V.:2650; Blood:280; IV Piggyback:500] Out: 1150 [Urine:450; Blood:700]  BLOOD ADMINISTERED:280 CC CELLSAVER  DRAINS: (Medium) Hemovact drain(s) in the epidural space with  Suction Open   LOCAL MEDICATIONS USED:  LIDOCAINE   SPECIMEN:  No Specimen  DISPOSITION OF SPECIMEN:  N/A  COUNTS:  YES  TOURNIQUET:  * No tourniquets in log *  DICTATION: Patient is a 59 year old with spondylosis , stenosis, spondylolisthesis, disc herniation and severe back and bilateral lower extremity pain at L4/5 level and L 5 S 1 levels  of the lumbar spine. It was elected to take her to surgery for MASPLIF L 45 and L 5 S 1 levels with posterolateral arthrodesis.  Procedure:   Following uncomplicated induction of GETA, and placement of electrodes for neural monitoring, patient was turned into a prone position on the [place] tableand using AP  fluoroscopy the area of planned incision was marked, prepped with betadine scrub and Duraprep, then draped. Exposure was performed of facet joint complex at L 45 and L 5 S 1 levels and the MAS retractor was placed.5.0 x 30 mm cortical Nuvasive screws were placed at L 4 bilaterally  according to standard landmarks using neural monitoring.  A total laminectomy of L 4 and L 5 was then performed with disarticulation of facets.  This bone was saved for grafting, combined with Osteocel after being run through bone mill and was placed in bone packing device.  Thorough discectomy was performed bilaterally at L 45 and the endplates were prepared for grafting.  23 x 10 x 12 degree cages were placed in the interspace and positioning was confirmed with AP and lateral fluoroscopy.  10 cc of autograft/Osteocel was packed in the interspace medial to the second cage.   Remaining screws were placed at L 5 and S 1 with 65 mm rods were placed.   Decompression was greater than for standard PLIF procedure with painstaking dissection of all neural elements through densely adherent scar tissue.  L 4, L 5, S 1 nerve roots were widely decompressed bilaterally.  It was elected not to perform PLIF portion of the procedure at L 5 S 1 as the bone was quite soft and the interspace appeared to be partially arthrodesed.   And the screws were locked and torqued. Final Xrays showed well positioned implants and screw fixation. The posterolateral region was packed with remaining 20 cc of autograft on each side of midline. The wounds were irrigated and then closed with 1, 2-0 and 3-0 Vicryl stitches. A medium Hemovac drain was placed.  Sterile occlusive dressing was placed with Dermabond. The patient was then extubated in the operating room and taken to recovery in  stable and satisfactory condition having tolerated her operation well. Counts were correct at the end of the case.   PLAN OF CARE: Admit to inpatient   PATIENT DISPOSITION:  PACU - hemodynamically stable.   Delay start of Pharmacological VTE agent (>24hrs) due to surgical blood loss or risk of bleeding: yes

## 2014-06-07 NOTE — H&P (Signed)
[place], [place] [address] Phone: [phone]   Patient ID:   [id number] Patient: [name]  Date of Birth: 1955/11/26 Visit Type: Office Visit   Date: 02/13/2014 08:30 AM Provider: Marchia Meiers. Vertell Limber MD   This 59 year old female presents for Follow Up of back pain.  History of Present Illness: 1.  Follow Up of back pain  Patient returns today and is continuing to complain of back and bilateral lower extremity pain.  I reviewed her new MRI which shows L4 L5 and L5-S1 spondylolisthesis with severe stenosis and nerve compression.  There are milder degenerative changes in the facet joints at the L3 L4 level but this level does not appear to have significant nerve root compression.  The patient has a [place] filter placed in O4 for prior DVT but has never had problems with hypercoagulable state since then.  Patient continues to complain of significant pain and wants to go ahead with surgery.  I have advised her of the importance of weight control.  To my review there is significant degenerative disease of the L5-S1 level with terminal nerve compression at this level.  The L4 L5 level was clearly the most severely affected.      Medical/Surgical/Interim History Reviewed, no change.  Last detailed document date:01/30/2014.   PAST MEDICAL HISTORY, SURGICAL HISTORY, FAMILY HISTORY, SOCIAL HISTORY AND REVIEW OF SYSTEMS I have reviewed the patient's past medical, surgical, family and social history as well as the comprehensive review of systems as included on the [facility] history form dated 01/30/2014, which I have signed.  Family History: Reviewed, no changes.  Last detailed document: 01/30/2014.   Social History: Tobacco use reviewed. Reviewed, no changes. Last detailed document date: 01/30/2014.      MEDICATIONS(added, continued or stopped this visit): Medication Comment:  Patient did not bring  medication list. Advised her to bring to next visit.   ALLERGIES:  Ingredient Reaction Medication Name Comment  SULFA (SULFONAMIDE ANTIBIOTICS) Rash    Reviewed, no changes.    Vitals Date Temp F BP Pulse Ht In Wt Lb BMI BSA Pain Score  02/13/2014  145/84 73 66.5 228 36.25  8/10      DIAGNOSTIC RESULTS Diagnostic report text  CLINICAL DATA: Low back and bilateral leg pain, right greater than left for 6 months. This has progressed over the past month.  EXAM: MRI LUMBAR SPINE WITHOUT CONTRAST  TECHNIQUE: Multiplanar, multisequence MR imaging of the lumbar spine was performed. No intravenous contrast was administered.  COMPARISON: 03/25/2012  FINDINGS: Stable degenerative lumbar spondylosis with multilevel disc disease and facet disease. The vertebral bodies demonstrate normal marrow signal. Multilevel Schmorl's nodes are noted. Endplate reactive changes at L5-S1. The conus medullaris terminates at the bottom of L1. Stable advanced facet disease without definite pars defects. No significant paraspinal or retroperitoneal findings.  L1-2: No significant findings. Mild stable facet disease.  L2-3: Mild annular bulge with mild lateral recess encroachment. Stable shallow broad-based left foraminal and extra foraminal disc protrusion without direct neural compression.  L3-4: Diffuse annular bulge and advanced bilateral facet disease. There is mild bilateral lateral recess encroachment and mild left greater than right foraminal encroachment.  L4-5: Diffuse bulging degenerated annulus, short pedicles and severe facet disease contributing to moderate to moderately severe spinal and bilateral lateral recess stenosis. This appears slightly progressive. There is also a progressive broad-based foraminal and extra foraminal disc protrusion on the left displacing the left L4 nerve root. Mild right-sided  foraminal stenosis appears relatively stable.  L5-S1: Severe degenerative  disc disease with moderate osteophytic ridging. No focal disc protrusion. Stable mild spinal stenosis but no significant foraminal stenosis.  IMPRESSION: 1. Stable mild bilateral lateral recess encroachment at L2-3. There is also stable shallow broad-based left foraminal and extra foraminal disc protrusion. 2. Stable mild bilateral lateral recess encroachment and mild left greater than right foraminal encroachment at L3-4. 3. Progressive multifactorial spinal and bilateral lateral recess stenosis at L4-5. There is also a progressive broad-based foraminal and extra foraminal disc protrusion on the left displacing the left L4 nerve root. Mild right foraminal stenosis appears stable.   Electronically Signed By: Kalman Jewels M.D. On: 02/07/2014 11:47    IMPRESSION Back and bilateral lower shoulder pain and weakness.  Proceed with L4 L5 and L5-S1 decompression and fusion.  Completed Orders (this encounter) Order Details Reason Side Interpretation Result Initial Treatment Date Region  Hypertension education Follow up with primary care physician.        Lifestyle education regarding diet Encouraged to eat a well balanced diet and follow up with primary care physician.         Assessment/Plan # Detail Type Description   1. Assessment Spondylolisthesis, lumbar region (M43.16).       2. Assessment Radiculopathy, lumbar region (M54.16).       3. Assessment Low back pain, unspecified back pain laterality, with sciatica presence unspecified (M54.5).       4. Assessment Spinal stenosis of lumbar region (M48.06).       5. Assessment Essential (primary) hypertension (I10).       6. Assessment Body mass index (BMI) 36.0-36.9, adult (Z68.36).   Plan Orders Today's instructions / counseling include(s) Lifestyle education regarding diet.         Pain Assessment/Treatment Pain Scale: 8/10. Method: Numeric Pain Intensity Scale. Location: back. Onset: 01/31/2012. Duration:  varies. Quality: discomforting. Pain Assessment/Treatment follow-up plan of care: Patient is currently taking medication for pain as prescribed..  Patient wishes to proceed with surgery.  She'll be fitted for a lumbosacral orthosis.  We'll obtain AP and lateral lumbar spine radiographs.  Lane on proceeding with surgery on Tuesday, April 30, 2014.  Patient is aware of this and benefits of surgery and wishes to proceed.  Orders: Diagnostic Procedures: Assessment Procedure  M43.16 PLIF - L4-L5 - L5-S1  M54.5 Lumbar Spine- AP/Lat  Instruction(s)/Education: Assessment Instruction  I10 Hypertension education  Z68.36 Lifestyle education regarding diet             Provider:  Marchia Meiers. Vertell Limber MD  02/16/2014 04:48 PM Dictation edited by: Marchia Meiers. Vertell Limber    CC Providers: [facility] [place],  [place]  [address]-   [facility] [address] [place], [place] [address]- ----------------------------------------------------------------------------------------------------------------------------------------------------------------------        Electronically signed by Marchia Meiers. Vertell Limber MD on 02/16/2014 04:48 PM  > [address] [place], [place] [address] Phone: [phone]   Patient ID:   [id number] Patient: [name]  Date of Birth: 23-Sep-1955 Visit Type: Office Visit   Date: 01/30/2014 10:15 AM Provider: Marchia Meiers. Vertell Limber MD   This 59 year old female presents for back pain.  History of Present Illness: 1.  back pain  [name] returns at DrSethi's urging for eval of peristent neck & back pain.  Cervical MRI May 2015, lumbar x-rays in March.  Tramadol 50mg  QID Gabapentin 300mg  QID  Patient describes low back pain.  She is been to Dr. Charleen Kirks who has performed 3 injections without relief.  Her imaging includes lumbar radiographs which demonstrate L4-L5 anterolisthesis  and degenerative changes at both the L4-L5 and L5-S1 levels.  In general radiographs demonstrate multiple alignment at the L4 L5 level with 14.5 mm on flexion, decreasing to 10.4 mm on extension and 12.2 mm on neutral lateral radiograph.  At L5-S1 she has severe degenerative changes.  On examination the patient has bilateral extensor hallucis longus weakness at 4 out of 5, right more affected than left.  She also notes difficulty climbing stairs and complains of pain in both her legs right greater than left.  She has decreased sensation right L5 distribution.  She has positive seated straight leg raise at right greater than left.  Patient underwent gastric bypass in 2008.  Currently rates her pain as 7 out of 10 with pain medications and 10 out of 10 without pain medications.  Also using tramadol and gabapentin.        PAST MEDICAL/SURGICAL HISTORY   (Detailed)      Family History  (Detailed)  SOCIAL HISTORY  (Detailed) Tobacco use reviewed. Preferred language is Unknown.   Smoking status: Former smoker.  SMOKING STATUS Use Status Type Smoking Status Usage Per Day Years Used Total Pack Years  yes  Former smoker             MEDICATIONS(added, continued or stopped this visit): Medication Comment:  Patient does not have medication list.   ALLERGIES:  Ingredient Reaction Medication Name Comment  SULFA (SULFONAMIDE ANTIBIOTICS) Rash    Reviewed, updated.    Vitals Date Temp F BP Pulse Ht In Wt Lb BMI BSA Pain Score  01/30/2014  126/77 61 66.5 227 36.09  7/10        IMPRESSION Patient has mobile spondylolisthesis of L4 and L5 with progressively worsening back and bilateral lower extremity pain.  I recommended an MRI be obtained for lumbar spine and the patient will come back to see me after this been performed.  Completed Orders (this encounter) Order Details Reason Side Interpretation Result Initial Treatment Date Region  Lifestyle education regarding diet  Encouraged to eat a well balanced diet and follow up with primary care physician.         Assessment/Plan # Detail Type Description   1. Assessment Spondylolisthesis, lumbar region (M43.16).       2. Assessment Spinal stenosis of lumbosacral region (M48.07).       3. Assessment Radiculopathy, lumbosacral region (M54.17).       4. Assessment Displacement of lumbosacral intervertebral disc (M51.27).       5. Assessment Body mass index (BMI) 36.0-36.9, adult (Z68.36).   Plan Orders Today's instructions / counseling include(s) Lifestyle education regarding diet.         Pain Assessment/Treatment Pain Scale: 7/10. Method: Numeric Pain Intensity Scale. Location: back. Onset: 01/31/2012. Duration: varies. Quality: discomforting. Pain Assessment/Treatment follow-up plan of care: Patient is currently taking medication for pain as prescribed..  Follow-up with lumbar imaging.  Orders: Diagnostic Procedures: Assessment Procedure  M43.16 MRI Spine/lumb W/o Contrast  Instruction(s)/Education: Assessment Instruction  Z68.36 Lifestyle education regarding diet             Provider:  Marchia Meiers. Vertell Limber MD  02/10/2014 02:01 PM Dictation edited by: Marchia Meiers. Vertell Limber    CC Providers: [facility] [address] [place],  [place]  [address]-   [facility] [place], [place]  [address]- ----------------------------------------------------------------------------------------------------------------------------------------------------------------------        Electronically signed by Marchia Meiers. Vertell Limber MD on 02/10/2014 02:02 PM

## 2014-06-07 NOTE — Interval H&P Note (Signed)
History and Physical Interval Note:  06/07/2014 7:25 AM  [name]  has presented today for surgery, with the diagnosis of Spondylolisthesis, Lumbar stenosis, Low back pain, Lumbar radiculopathy  The various methods of treatment have been discussed with the patient and family. After consideration of risks, benefits and other options for treatment, the patient has consented to  Procedure(s) with comments: L4-5 L5-S1 FOR MAXIMUM ACCESS (MAS) POSTERIOR LUMBAR INTERBODY FUSION  (N/A) - L4-5 L5-S1 FOR MAXIMUM ACCESS (MAS) POSTERIOR LUMBAR INTERBODY FUSION  as a surgical intervention .  The patient's history has been reviewed, patient examined, no change in status, stable for surgery.  I have reviewed the patient's chart and labs.  Questions were answered to the patient's satisfaction.     Homer Miller D

## 2014-06-07 NOTE — Progress Notes (Signed)
Dr. Finis Bud called to request that patient have a T&S and an I Stat 4 unless the surgeon ordered a Bmet, in which case do the Bmet instead of the [place].

## 2014-06-07 NOTE — Anesthesia Procedure Notes (Signed)
Procedure Name: Intubation Date/Time: 06/07/2014 10:01 AM Performed by: Jenne Campus Pre-anesthesia Checklist: Patient identified, Emergency Drugs available, Suction available, Patient being monitored and Timeout performed Patient Re-evaluated:Patient Re-evaluated prior to inductionOxygen Delivery Method: Circle system utilized Preoxygenation: Pre-oxygenation with 100% oxygen Intubation Type: IV induction Ventilation: Mask ventilation without difficulty Laryngoscope Size: Miller and 2 Grade View: Grade I Tube type: Oral Tube size: 7.0 mm Number of attempts: 1 Airway Equipment and Method: Stylet and LTA kit utilized Placement Confirmation: ETT inserted through vocal cords under direct vision,  CO2 detector,  positive ETCO2 and breath sounds checked- equal and bilateral Secured at: 21 cm Tube secured with: Tape Dental Injury: Teeth and Oropharynx as per pre-operative assessment

## 2014-06-07 NOTE — Brief Op Note (Signed)
06/07/2014  3:04 PM  PATIENT:  [name]  58 y.o. female  PRE-OPERATIVE DIAGNOSIS:  Spondylolisthesis, Lumbar stenosis, Low back pain, Lumbar radiculopathy L 45 and L 5 S 1 levels  POST-OPERATIVE DIAGNOSIS:  Spondylolisthesis, Lumbar stenosis, Low back pain, Lumbar radiculopathy L 45 and L 5 S 1 levels  PROCEDURE:  Procedure(s) with comments: L4-5 L5-S1 FOR MAXIMUM ACCESS (MAS) POSTERIOR LUMBAR INTERBODY FUSION  (N/A) - L4-5 L5-S1 FOR MAXIMUM ACCESS (MAS) POSTERIOR LUMBAR INTERBODY FUSION  L 45 and L 5 S 1 fusion with pedicle screw fixation and posterolateral arthrodesis  SURGEON:  Surgeon(s) and Role:    * Erline Levine, MD - Primary    * Faythe Ghee, MD - Assisting  PHYSICIAN ASSISTANT:   ASSISTANTS: Poteat, RN   ANESTHESIA:   general  EBL:  Total I/O In: 2229 [I.V.:2650; Blood:280; IV Piggyback:500] Out: 1150 [Urine:450; Blood:700]  BLOOD ADMINISTERED:280 CC CELLSAVER  DRAINS: (Medium) Hemovact drain(s) in the epidural space with  Suction Open   LOCAL MEDICATIONS USED:  LIDOCAINE   SPECIMEN:  No Specimen  DISPOSITION OF SPECIMEN:  N/A  COUNTS:  YES  TOURNIQUET:  * No tourniquets in log *  DICTATION: Patient is a 59 year old with spondylosis , stenosis, spondylolisthesis, disc herniation and severe back and bilateral lower extremity pain at L4/5 level and L 5 S 1 levels  of the lumbar spine. It was elected to take her to surgery for MASPLIF L 45 and L 5 S 1 levels with posterolateral arthrodesis.  Procedure:   Following uncomplicated induction of GETA, and placement of electrodes for neural monitoring, patient was turned into a prone position on the [place] tableand using AP  fluoroscopy the area of planned incision was marked, prepped with betadine scrub and Duraprep, then draped. Exposure was performed of facet joint complex at L 45 and L 5 S 1 levels and the MAS retractor was placed.5.0 x 30 mm cortical Nuvasive screws were placed at L 4 bilaterally  according to standard landmarks using neural monitoring.  A total laminectomy of L 4 and L 5 was then performed with disarticulation of facets.  This bone was saved for grafting, combined with Osteocel after being run through bone mill and was placed in bone packing device.  Thorough discectomy was performed bilaterally at L 45 and the endplates were prepared for grafting.  23 x 10 x 12 degree cages were placed in the interspace and positioning was confirmed with AP and lateral fluoroscopy.  10 cc of autograft/Osteocel was packed in the interspace medial to the second cage.   Remaining screws were placed at L 5 and S 1 with 65 mm rods were placed.   Decompression was greater than for standard PLIF procedure with painstaking dissection of all neural elements through densely adherent scar tissue.  L 4, L 5, S 1 nerve roots were widely decompressed bilaterally.  It was elected not to perform PLIF portion of the procedure at L 5 S 1 as the bone was quite soft and the interspace appeared to be partially arthrodesed.   And the screws were locked and torqued. Final Xrays showed well positioned implants and screw fixation. The posterolateral region was packed with remaining 20 cc of autograft on each side of midline. The wounds were irrigated and then closed with 1, 2-0 and 3-0 Vicryl stitches. A medium Hemovac drain was placed.  Sterile occlusive dressing was placed with Dermabond. The patient was then extubated in the operating room and taken to recovery in  stable and satisfactory condition having tolerated her operation well. Counts were correct at the end of the case.   PLAN OF CARE: Admit to inpatient   PATIENT DISPOSITION:  PACU - hemodynamically stable.   Delay start of Pharmacological VTE agent (>24hrs) due to surgical blood loss or risk of bleeding: yes

## 2014-06-07 NOTE — Transfer of Care (Signed)
Immediate Anesthesia Transfer of Care Note  Patient: [name]  Procedure(s) Performed: Procedure(s) with comments: L4-5 L5-S1 FOR MAXIMUM ACCESS (MAS) POSTERIOR LUMBAR INTERBODY FUSION  (N/A) - L4-5 L5-S1 FOR MAXIMUM ACCESS (MAS) POSTERIOR LUMBAR INTERBODY FUSION   Patient Location: PACU  Anesthesia Type:General  Level of Consciousness: awake, oriented and patient cooperative  Airway & Oxygen Therapy: Patient Spontanous Breathing and Patient connected to face mask oxygen  Post-op Assessment: Report given to RN and Post -op Vital signs reviewed and stable  Post vital signs: Reviewed  Last Vitals:  Filed Vitals:   06/07/14 0832  BP:   Pulse:   Temp: 36.6 C  Resp:     Complications: No apparent anesthesia complications

## 2014-06-07 NOTE — Anesthesia Preprocedure Evaluation (Addendum)
Anesthesia Evaluation  Patient identified by MRN, date of birth, ID band Patient awake    Reviewed: Allergy & Precautions, NPO status , Patient's Chart, lab work & pertinent test results  Airway Mallampati: II  TM Distance: >3 FB Neck ROM: Full    Dental  (+) Edentulous Upper, Edentulous Lower, Dental Advisory Given   Pulmonary shortness of breath, asthma , sleep apnea , pneumonia -, former smoker,  breath sounds clear to auscultation        Cardiovascular hypertension, Pt. on medications and Pt. on home beta blockers +CHF + dysrhythmias Rhythm:Regular Rate:Normal     Neuro/Psych PSYCHIATRIC DISORDERS Anxiety Depression    GI/Hepatic Neg liver ROS, GERD-  ,  Endo/Other  diabetes, Type 2Morbid obesity  Renal/GU negative Renal ROS     Musculoskeletal  (+) Arthritis -,   Abdominal   Peds  Hematology   Anesthesia Other Findings   Reproductive/Obstetrics negative OB ROS                           Anesthesia Physical Anesthesia Plan  ASA: III  Anesthesia Plan: General   Post-op Pain Management:    Induction: Intravenous  Airway Management Planned: Oral ETT  Additional Equipment:   Intra-op Plan:   Post-operative Plan:   Informed Consent: I have reviewed the patients History and Physical, chart, labs and discussed the procedure including the risks, benefits and alternatives for the proposed anesthesia with the patient or authorized representative who has indicated his/her understanding and acceptance.   Dental advisory given  Plan Discussed with: CRNA and Anesthesiologist  Anesthesia Plan Comments:        Anesthesia Quick Evaluation

## 2014-06-07 NOTE — Anesthesia Postprocedure Evaluation (Signed)
Anesthesia Post Note  Patient: [name]  Procedure(s) Performed: Procedure(s) (LRB): L4-5 L5-S1 FOR MAXIMUM ACCESS (MAS) POSTERIOR LUMBAR INTERBODY FUSION  (N/A)  Anesthesia type: general  Patient location: PACU  Post pain: Pain level controlled  Post assessment: Patient's Cardiovascular Status Stable  Last Vitals:  Filed Vitals:   06/07/14 1615  BP: 99/57  Pulse: 60  Temp:   Resp: 14    Post vital signs: Reviewed and stable  Level of consciousness: sedated  Complications: No apparent anesthesia complications

## 2014-06-07 NOTE — Progress Notes (Signed)
Awake, alert, conversant.  Full strength both lower extremities.  Doing well.

## 2014-06-07 NOTE — Progress Notes (Signed)
Received pt from PACU at 1700, admitted to 4N25. Report given by Darcella Gasman, RN. Pt post  L4-5 L5-S1 posterior lumbar interbody fusion.Oriented to room, no complaints of pain at this time. Accompanied by sister and friend.

## 2014-06-08 ENCOUNTER — Encounter (HOSPITAL_COMMUNITY): Payer: Self-pay | Admitting: Neurosurgery

## 2014-06-08 MED ORDER — PANTOPRAZOLE SODIUM 40 MG PO TBEC
40.0000 mg | DELAYED_RELEASE_TABLET | Freq: Every day | ORAL | Status: DC
  Administered 2014-06-08 – 2014-06-10 (×3): 40 mg via ORAL
  Filled 2014-06-08 (×5): qty 1

## 2014-06-08 NOTE — Progress Notes (Signed)
Postop day 1. Patient overall doing well. Back pain reasonably well controlled. Denies any lower extremity pain. Mobilizing slowly.  Afebrile. Blood pressure a little on the low side but no tachycardia or symptoms associated with it. Good urine output. Drain output moderate. Awake and alert. Oriented and appropriate. Motor and sensory function intact. Chest clear. Abdomen soft and nontender. Dressing clean and dry.  Overall doing well. Continue efforts at mobilization. Possible discharge tomorrow.

## 2014-06-08 NOTE — Evaluation (Signed)
Physical Therapy Evaluation Patient Details Name: [name] MRN: [id number] DOB: Jun 19, 1955 Today's Date: 06/08/2014   History of Present Illness  Pt underwent PLIF 06-07-14.  Clinical Impression  Patient is s/p above surgery resulting in the deficits listed below (see PT Problem List).  Patient will benefit from skilled PT to increase their independence and safety with mobility (while adhering to their precautions) to allow discharge to the venue listed below.     Follow Up Recommendations SNF (Pt plans to go to [facility] for rehab.)    Equipment Recommendations  None recommended by PT    Recommendations for Other Services       Precautions / Restrictions Precautions Precautions: Back Precaution Comments: Pt educated on 3/3 back precautions. Handout provided. Required Braces or Orthoses: Spinal Brace Spinal Brace: Lumbar corset;Applied in sitting position      Mobility  Bed Mobility Overal bed mobility: Needs Assistance Bed Mobility: Rolling;Sidelying to Sit Rolling: Min guard Sidelying to sit: Min assist       General bed mobility comments: verbal cues for logroll  Transfers Overall transfer level: Needs assistance Equipment used: Rolling walker (2 wheeled) Transfers: Sit to/from Stand Sit to Stand: Min assist         General transfer comment: verbal cues for hand placement  Ambulation/Gait Ambulation/Gait assistance: Min guard Ambulation Distance (Feet): 60 Feet Assistive device: Rolling walker (2 wheeled) Gait Pattern/deviations: Step-through pattern;Decreased stride length Gait velocity: decreased      Stairs            Wheelchair Mobility    Modified Rankin (Stroke Patients Only)       Balance                                             Pertinent Vitals/Pain Pain Assessment: 0-10 Pain Score: 5  Pain Location: back Pain Intervention(s): Monitored during session;Repositioned;Premedicated before session     Home Living Family/patient expects to be discharged to:: Private residence Living Arrangements: Alone Available Help at Discharge: Family;Available PRN/intermittently Type of Home: House Home Access: Stairs to enter Entrance Stairs-Rails: None Entrance Stairs-Number of Steps: 2 Home Layout: One level Home Equipment: Walker - 4 wheels;Cane - single point      Prior Function Level of Independence: Independent with assistive device(s)               Hand Dominance        Extremity/Trunk Assessment   Upper Extremity Assessment: Defer to OT evaluation           Lower Extremity Assessment: Overall WFL for tasks assessed      Cervical / Trunk Assessment: Normal  Communication   Communication: No difficulties  Cognition Arousal/Alertness: Awake/alert Behavior During Therapy: WFL for tasks assessed/performed Overall Cognitive Status: Within Functional Limits for tasks assessed                      General Comments      Exercises        Assessment/Plan    PT Assessment Patient needs continued PT services  PT Diagnosis Difficulty walking;Acute pain   PT Problem List Decreased strength;Decreased activity tolerance;Decreased balance;Decreased mobility;Pain;Decreased knowledge of precautions;Decreased knowledge of use of DME  PT Treatment Interventions DME instruction;Gait training;Stair training;Functional mobility training;Therapeutic activities;Patient/family education;Balance training   PT Goals (Current goals can be found in the Care Plan section)  Acute Rehab PT Goals Patient Stated Goal: independence PT Goal Formulation: With patient Time For Goal Achievement: 06/15/14 Potential to Achieve Goals: Good    Frequency Min 5X/week   Barriers to discharge Decreased caregiver support      Co-evaluation               End of Session Equipment Utilized During Treatment: Gait belt;Back brace Activity Tolerance: Patient tolerated treatment  well Patient left: in chair;with call bell/phone within reach Nurse Communication: Mobility status         Time: 9675-9163 PT Time Calculation (min) (ACUTE ONLY): 23 min   Charges:   PT Evaluation $Initial PT Evaluation Tier I: 1 Procedure PT Treatments $Gait Training: 8-22 mins   PT G Codes:        Lorriane Shire 06/08/2014, 1:22 PM

## 2014-06-08 NOTE — Evaluation (Signed)
Occupational Therapy Evaluation Patient Details Name: [name] MRN: [id number] DOB: 04-15-1956 Today's Date: 06/08/2014    History of Present Illness Pt underwent PLIF 06-07-14.   Clinical Impression   PTA pt lived at home and was independent with ADLs with use of AD. Pt currently limited by "grabbing pain" in her back (possibly cramping?) and requires assistance for LB ADLs and min A for functional mobility. Pt would benefit from SNF at d/c as pt lives alone. All further OT needs will be met at next venue of care.      Follow Up Recommendations  SNF;Supervision/Assistance - 24 hour    Equipment Recommendations  Other (comment) (defer to SNF)    Recommendations for Other Services       Precautions / Restrictions Precautions Precautions: Back Precaution Booklet Issued: Yes (comment) Precaution Comments: Pt educated on 3/3 back precautions. Handout provided. Required Braces or Orthoses: Spinal Brace Spinal Brace: Lumbar corset;Applied in sitting position Restrictions Weight Bearing Restrictions: No      Mobility Bed Mobility Overal bed mobility: Needs Assistance Bed Mobility: Rolling;Sidelying to Sit Rolling: Min guard Sidelying to sit: Min assist       General bed mobility comments: verbal cues for logroll; min A to elevate trunk off bed  Transfers Overall transfer level: Needs assistance Equipment used: Rolling walker (2 wheeled) Transfers: Sit to/from Stand Sit to Stand: Min assist         General transfer comment: verbal cues for hand placement, Min A to power up         ADL Overall ADL's : Needs assistance/impaired Eating/Feeding: Independent;Sitting   Grooming: Set up;Sitting   Upper Body Bathing: Set up;Sitting   Lower Body Bathing: Moderate assistance;Sit to/from stand   Upper Body Dressing : Set up;Sitting Upper Body Dressing Details (indicate cue type and reason): including brace Lower Body Dressing: Maximal assistance;Sit  to/from stand   Toilet Transfer: Minimal assistance;Ambulation;RW Toilet Transfer Details (indicate cue type and reason): sit<>stand from bed to recliner         Functional mobility during ADLs: Minimal assistance;Rolling walker General ADL Comments: Pt limited by pain and c/o "grabbing" (cramping?) in her back near the incision.                Pertinent Vitals/Pain Pain Assessment: 0-10 Pain Score: 7  Pain Location: back Pain Descriptors / Indicators:  ("grabbing") Pain Intervention(s): Limited activity within patient's tolerance;Monitored during session;Repositioned;Patient requesting pain meds-RN notified     Hand Dominance Right   Extremity/Trunk Assessment Upper Extremity Assessment Upper Extremity Assessment: Overall WFL for tasks assessed   Lower Extremity Assessment Lower Extremity Assessment: Overall WFL for tasks assessed   Cervical / Trunk Assessment Cervical / Trunk Assessment: Normal   Communication Communication Communication: No difficulties   Cognition Arousal/Alertness: Awake/alert Behavior During Therapy: WFL for tasks assessed/performed Overall Cognitive Status: Within Functional Limits for tasks assessed                                Home Living Family/patient expects to be discharged to:: Skilled nursing facility Living Arrangements: Alone                               Additional Comments: pt has pre-arranged at [facility]      Prior Functioning/Environment Level of Independence: Independent with assistive device(s)  OT Diagnosis: Generalized weakness;Acute pain    End of Session Equipment Utilized During Treatment: Gait belt;Rolling walker;Back brace Nurse Communication: Patient requests pain meds  Activity Tolerance: Patient limited by pain Patient left: in chair;with call bell/phone within reach   Time: 1950-9326 OT Time Calculation (min): 23 min Charges:  OT General Charges $OT  Visit: 1 Procedure OT Evaluation $Initial OT Evaluation Tier I: 1 Procedure OT Treatments $Self Care/Home Management : 8-22 mins G-Codes:    Juluis Rainier 2014-06-23, 5:55 PM  Cyndie Chime, OTR/L Occupational Therapist [phone] (pager)

## 2014-06-09 NOTE — Progress Notes (Signed)
Patient ID: [name], female   DOB: 01-08-1956, 59 y.o.   MRN: [id number] Afeb, vss No new neuro issues Complaining of more incisional pain today.  Will make sure she takes her muscle relaxant as well as pain meds. Home when she feels ready.

## 2014-06-09 NOTE — Progress Notes (Signed)
Physical Therapy Treatment Patient Details Name: [name] MRN: [id number] DOB: [birth year] Today's Date: 06/09/2014    History of Present Illness Pt underwent PLIF 06-07-14.    PT Comments    Patient with increased pain and muscle spasms today, limiting mobility.  Follow Up Recommendations  SNF;Supervision/Assistance - 24 hour ([place])     Equipment Recommendations  None recommended by PT    Recommendations for Other Services       Precautions / Restrictions Precautions Precautions: Back;Fall Precaution Comments: Reviewed back precautions Required Braces or Orthoses: Spinal Brace Spinal Brace: Lumbar corset;Applied in sitting position Restrictions Weight Bearing Restrictions: No    Mobility  Bed Mobility Overal bed mobility: Needs Assistance Bed Mobility: Rolling;Sit to Sidelying Rolling: Min guard       Sit to sidelying: Min assist General bed mobility comments: Verbal cues for technique.  Increased time to lower self to sidelying, with assist to bring BLE's onto bed.  Transfers Overall transfer level: Needs assistance Equipment used: Rolling walker (2 wheeled) Transfers: Sit to/from Stand Sit to Stand: Mod assist         General transfer comment: Patient sitting in recliner with feet elevated.  Encouraged patient to sit with feet on floor.  Patient began having muscle spasms as feet were lowered to floor.  Instructed patient to scoot to edge of chair - required increased time due to pain.  Required mod assist to power up to standing.  Patient with difficulty returning to sitting onto bed.  Elevated bed and assisted with descent to sitting.  Ambulation/Gait Ambulation/Gait assistance: Min assist Ambulation Distance (Feet): 10 Feet Assistive device: Rolling walker (2 wheeled) Gait Pattern/deviations: Step-through pattern;Decreased step length - right;Decreased step length - left;Decreased stride length;Shuffle;Trunk flexed Gait velocity:  decreased Gait velocity interpretation: Below normal speed for age/gender General Gait Details: Verbal cues to stand upright - patient leaning forward due to pain.  Patient ambulated 5' (with increased time required) and reported unable to go further.  Turned and moved to bed.   Stairs            Wheelchair Mobility    Modified Rankin (Stroke Patients Only)       Balance                                    Cognition Arousal/Alertness: Awake/alert Behavior During Therapy: WFL for tasks assessed/performed Overall Cognitive Status: Within Functional Limits for tasks assessed                      Exercises      General Comments        Pertinent Vitals/Pain Pain Assessment: 0-10 Pain Score: 9  Pain Location: back Pain Descriptors / Indicators: Aching;Sore;Spasm Pain Intervention(s): Limited activity within patient's tolerance;Repositioned;Patient requesting pain meds-RN notified;RN gave pain meds during session    Home Living                      Prior Function            PT Goals (current goals can now be found in the care plan section) Progress towards PT goals: Not progressing toward goals - comment (Due to pain and muscle spasms)    Frequency  Min 5X/week    PT Plan Current plan remains appropriate    Co-evaluation  End of Session Equipment Utilized During Treatment: Gait belt;Back brace Activity Tolerance: Patient limited by pain Patient left: in bed;with call bell/phone within reach;with bed alarm set;with nursing/sitter in room;with family/visitor present     Time: 1157-2620 PT Time Calculation (min) (ACUTE ONLY): 34 min  Charges:  $Gait Training: 8-22 mins $Therapeutic Activity: 8-22 mins                    G Codes:      Despina Pole 06-20-2014, 5:41 PM Carita Pian. Sanjuana Kava, [place] Pager [phone]

## 2014-06-10 MED ORDER — BISACODYL 10 MG RE SUPP
10.0000 mg | Freq: Every day | RECTAL | Status: DC | PRN
  Administered 2014-06-10: 10 mg via RECTAL
  Filled 2014-06-10: qty 1

## 2014-06-10 NOTE — Progress Notes (Signed)
Utilization review completed.  

## 2014-06-10 NOTE — Clinical Social Work Placement (Addendum)
Clinical Social Work Department CLINICAL SOCIAL WORK PLACEMENT NOTE 06/10/2014  Patient:  [name], [name]  Account Number:  [id number] Admit date:  06/07/2014  Clinical Social Worker:  Greta Doom, LCSWA  Date/time:  06/10/2014 09:08 AM  Clinical Social Work is seeking post-discharge placement for this patient at the following level of care:   SKILLED NURSING   (*CSW will update this form in Epic as items are completed)   06/10/2014  Patient/family provided with [place] Department of Clinical Social Work's list of facilities offering this level of care within the geographic area requested by the patient (or if unable, by the patient's family).  06/10/2014  Patient/family informed of their freedom to choose among providers that offer the needed level of care, that participate in Medicare, Medicaid or managed care program needed by the patient, have an available bed and are willing to accept the patient.  06/10/2014  Patient/family informed of [facility]' ownership interest in [facility], as well as of the fact that they are under no obligation to receive care at this facility.  PASARR submitted to EDS on  PASARR number received on   FL2 transmitted to all facilities in geographic area requested by pt/family on  06/10/2014 FL2 transmitted to all facilities within larger geographic area on 06/10/2014  Patient informed that his/her managed care company has contracts with or will negotiate with  certain facilities, including the following:     Patient/family informed of bed offers received: 06/10/2014  Patient chooses bed at [facility]  Physician recommends and patient chooses bed at    Patient to be transferred to [facility] on  06/11/2014 Patient to be transferred to facility by [facility]  Patient and family notified of transfer on 06/11/2014 Name of family member notified: Pt reported she will call her family.    The following physician request were entered in  Epic:   Additional Comments:  Pt has pervious PASSAR.   [place], MSW, [place]

## 2014-06-10 NOTE — Progress Notes (Signed)
Physical Therapy Treatment Patient Details Name: [name] MRN: [id number] DOB: March 12, 1956 Today's Date: 06/10/2014    History of Present Illness Pt underwent PLIF 06-07-14.    PT Comments    Patient progressing slowly with mobility. Continues to have increased difficulty with transfers and standing due to weakness and pain in BLEs/pain. Improved ambulation distance today with multiple standing rest breaks. Fatigues easily. Continues to be appropriate for [facility]. Will follow acutely.   Follow Up Recommendations  SNF;Supervision/Assistance - 24 hour     Equipment Recommendations  None recommended by PT    Recommendations for Other Services       Precautions / Restrictions Precautions Precautions: Back;Fall Precaution Comments: Reviewed back precautions. Able to verbalize 2/3 independently. Cues required for last precaution Required Braces or Orthoses: Spinal Brace Spinal Brace: Lumbar corset;Applied in sitting position Restrictions Weight Bearing Restrictions: No    Mobility  Bed Mobility               General bed mobility comments: Received sitting in chair upon PT arrival.   Transfers Overall transfer level: Needs assistance Equipment used: Rolling walker (2 wheeled) Transfers: Sit to/from Stand Sit to Stand: Mod assist;Max assist         General transfer comment: Cues for hand placement, technique and anterior translation. Increased effort and difficulty to stand. Mod-Max A to rise.  Ambulation/Gait Ambulation/Gait assistance: Min assist Ambulation Distance (Feet): 75 Feet Assistive device: Rolling walker (2 wheeled) Gait Pattern/deviations: Step-through pattern;Decreased stride length;Trunk flexed;Shuffle Gait velocity: decreased   General Gait Details: Verbal cues to stand upright - patient leaning forward due to pain.  Multiple short standing rest breaks due to pain/fatigue.    Stairs            Wheelchair Mobility    Modified  Rankin (Stroke Patients Only)       Balance Overall balance assessment: Needs assistance Sitting-balance support: Feet supported;No upper extremity supported Sitting balance-Leahy Scale: Fair     Standing balance support: During functional activity Standing balance-Leahy Scale: Poor                      Cognition Arousal/Alertness: Awake/alert Behavior During Therapy: WFL for tasks assessed/performed Overall Cognitive Status: Within Functional Limits for tasks assessed       Memory: Decreased recall of precautions              Exercises      General Comments        Pertinent Vitals/Pain Pain Assessment: Faces Faces Pain Scale: Hurts even more Pain Location: back at surgical site Pain Descriptors / Indicators: Sore;Spasm Pain Intervention(s): Limited activity within patient's tolerance;Monitored during session;Repositioned    Home Living                      Prior Function            PT Goals (current goals can now be found in the care plan section) Progress towards PT goals: Progressing toward goals    Frequency  Min 5X/week    PT Plan Current plan remains appropriate    Co-evaluation             End of Session Equipment Utilized During Treatment: Gait belt;Back brace Activity Tolerance: Patient tolerated treatment well;Patient limited by pain Patient left: in chair;with call bell/phone within reach     Time: 3875-6433 PT Time Calculation (min) (ACUTE ONLY): 20 min  Charges:  $Gait Training: 8-22 mins  G CodesCandy Sledge A Jun 24, 2014, 12:22 PM Candy Sledge, [place], DPT [phone]

## 2014-06-10 NOTE — Progress Notes (Addendum)
Subjective: Patient reports "I had a lot of spasms yesterday, so I didn't go far; but Saturday I walked"  Objective: Vital signs in last 24 hours: Temp:  [98.4 F (36.9 C)-100 F (37.8 C)] 98.6 F (37 C) (02/22 0550) Pulse Rate:  [69-94] 71 (02/22 0550) Resp:  [18-20] 20 (02/22 0550) BP: (97-125)/(44-70) 125/70 mmHg (02/22 0550) SpO2:  [94 %-98 %] 98 % (02/22 0550)  Intake/Output from previous day: 02/21 0701 - 02/22 0700 In: 240 [P.O.:240] Out: -  Intake/Output this shift:    Alert, conversant. Lumbar spasms. Goodstrength BLE. Incision without erythema, swelling, or drainage. Drsg intact. Reports no legs pain or numbness. No BM yet.  Lab Results: No results for input(s): WBC, HGB, HCT, PLT in the last 72 hours. BMET No results for input(s): NA, K, CL, CO2, GLUCOSE, BUN, CREATININE, CALCIUM in the last 72 hours.  Studies/Results: No results found.  Assessment/Plan: Improving   LOS: 3 days  Continue to mobilize today, working on bowels. Plan for d/c to [facility] in AM.   Verdis Prime 06/10/2014, 8:59 AM    Patient making good progress, likely D/C in am.

## 2014-06-10 NOTE — Clinical Social Work Note (Signed)
The pt will transition to [facility]. CSW will continue to follow and assist with discharge.   [place], MSW, [place]

## 2014-06-10 NOTE — Clinical Social Work Psychosocial (Signed)
Clinical Social Work Department BRIEF PSYCHOSOCIAL ASSESSMENT 06/10/2014  Patient:  [name], [name]     Account Number:  [id number]     Admit date:  06/07/2014  Clinical Social Worker:  Marciano Sequin  Date/Time:  06/10/2014 08:55 AM  Referred by:  RN  Date Referred:  06/10/2014 Referred for  SNF Placement   Other Referral:   Interview type:  Patient Other interview type:    PSYCHOSOCIAL DATA Living Status:  ALONE Admitted from facility:   Level of care:   Primary support name:  Russell,Helen Primary support relationship to patient:  SIBLING Degree of support available:   Strong Support    CURRENT CONCERNS Current Concerns  None Noted   Other Concerns:    SOCIAL WORK ASSESSMENT / PLAN CSW met the pt at the bedside.  CSW introduced self and purpose of the visit. CSW discussed clinical recommendation for SNF rehab. CSW inquired about the geographical location in which the pt would like to receive rehab from. CSW explained the SNF rehab process to the pt. CSW and pt discussed insurance and its relation to SNF rehab. CSW answered all questions in which the pt inquired about. CSW provided pt with contact information for further questions. CSW will continue to follow this pt and assist with discharge as needed.   Assessment/plan status:  Psychosocial Support/Ongoing Assessment of Needs Other assessment/ plan:   Information/referral to community resources:    PATIENT'S/FAMILY'S RESPONSE TO CURRENT DIAGNOSE: Pt presented with a upbeat mood and a bright affect. Pt oriented 4x. Pt reported being relieved about having surgery. Pt reported being in serve pain prior to her surgery. Pt appeared to be excite about starting rehab, so she can transition back home.     PATIENT'S/FAMILY'S RESPONSE TO PLAN OF CARE: Pt was receptive during assessent. Pt prefers to go to [facility] to receive rehab.    [place], MSW, [place]

## 2014-06-11 DIAGNOSIS — D649 Anemia, unspecified: Secondary | ICD-10-CM | POA: Diagnosis not present

## 2014-06-11 DIAGNOSIS — F329 Major depressive disorder, single episode, unspecified: Secondary | ICD-10-CM | POA: Diagnosis not present

## 2014-06-11 DIAGNOSIS — M5416 Radiculopathy, lumbar region: Secondary | ICD-10-CM | POA: Diagnosis not present

## 2014-06-11 DIAGNOSIS — R1013 Epigastric pain: Secondary | ICD-10-CM | POA: Diagnosis not present

## 2014-06-11 DIAGNOSIS — K5901 Slow transit constipation: Secondary | ICD-10-CM | POA: Diagnosis not present

## 2014-06-11 DIAGNOSIS — R208 Other disturbances of skin sensation: Secondary | ICD-10-CM | POA: Diagnosis not present

## 2014-06-11 DIAGNOSIS — T814XXD Infection following a procedure, subsequent encounter: Secondary | ICD-10-CM | POA: Diagnosis not present

## 2014-06-11 DIAGNOSIS — G4700 Insomnia, unspecified: Secondary | ICD-10-CM | POA: Diagnosis not present

## 2014-06-11 DIAGNOSIS — R110 Nausea: Secondary | ICD-10-CM | POA: Diagnosis not present

## 2014-06-11 DIAGNOSIS — R112 Nausea with vomiting, unspecified: Secondary | ICD-10-CM | POA: Diagnosis not present

## 2014-06-11 DIAGNOSIS — D509 Iron deficiency anemia, unspecified: Secondary | ICD-10-CM | POA: Diagnosis not present

## 2014-06-11 DIAGNOSIS — M4806 Spinal stenosis, lumbar region: Secondary | ICD-10-CM | POA: Diagnosis not present

## 2014-06-11 DIAGNOSIS — R600 Localized edema: Secondary | ICD-10-CM | POA: Diagnosis not present

## 2014-06-11 DIAGNOSIS — R300 Dysuria: Secondary | ICD-10-CM | POA: Diagnosis not present

## 2014-06-11 DIAGNOSIS — R278 Other lack of coordination: Secondary | ICD-10-CM | POA: Diagnosis not present

## 2014-06-11 DIAGNOSIS — M6281 Muscle weakness (generalized): Secondary | ICD-10-CM | POA: Diagnosis not present

## 2014-06-11 DIAGNOSIS — T814XXA Infection following a procedure, initial encounter: Secondary | ICD-10-CM | POA: Diagnosis not present

## 2014-06-11 DIAGNOSIS — M4800 Spinal stenosis, site unspecified: Secondary | ICD-10-CM | POA: Diagnosis not present

## 2014-06-11 DIAGNOSIS — R413 Other amnesia: Secondary | ICD-10-CM | POA: Diagnosis not present

## 2014-06-11 DIAGNOSIS — R109 Unspecified abdominal pain: Secondary | ICD-10-CM | POA: Diagnosis not present

## 2014-06-11 DIAGNOSIS — R2681 Unsteadiness on feet: Secondary | ICD-10-CM | POA: Diagnosis not present

## 2014-06-11 DIAGNOSIS — E876 Hypokalemia: Secondary | ICD-10-CM | POA: Diagnosis not present

## 2014-06-11 DIAGNOSIS — R42 Dizziness and giddiness: Secondary | ICD-10-CM | POA: Diagnosis not present

## 2014-06-11 DIAGNOSIS — Z4789 Encounter for other orthopedic aftercare: Secondary | ICD-10-CM | POA: Diagnosis not present

## 2014-06-11 DIAGNOSIS — M797 Fibromyalgia: Secondary | ICD-10-CM | POA: Diagnosis not present

## 2014-06-11 DIAGNOSIS — I10 Essential (primary) hypertension: Secondary | ICD-10-CM | POA: Diagnosis not present

## 2014-06-11 DIAGNOSIS — M4316 Spondylolisthesis, lumbar region: Secondary | ICD-10-CM | POA: Diagnosis not present

## 2014-06-11 DIAGNOSIS — Q7649 Other congenital malformations of spine, not associated with scoliosis: Secondary | ICD-10-CM | POA: Diagnosis not present

## 2014-06-11 NOTE — Discharge Summary (Signed)
Physician Discharge Summary  Patient ID: [name] MRN: [id number] DOB/AGE: October 18, 1955 59 y.o.  Admit date: 06/07/2014 Discharge date: 06/11/2014  Admission Diagnoses: Spondylolisthesis, Lumbar stenosis, Low back pain, Lumbar radiculopathy L 45 and L 5 S 1 levels   Discharge Diagnoses: Spondylolisthesis, Lumbar stenosis, Low back pain, Lumbar radiculopathy L 45 and L 5 S 1 levels s/p L4-5 L5-S1 FOR MAXIMUM ACCESS (MAS) POSTERIOR LUMBAR INTERBODY FUSION  (N/A) - L4-5 L5-S1 FOR MAXIMUM ACCESS (MAS) POSTERIOR LUMBAR INTERBODY FUSION  L 45 and L 5 S 1 fusion with pedicle screw fixation and posterolateral arthrodesis  Active Problems:   Spondylolisthesis of lumbar region   Discharged Condition: good  Hospital Course: [name] was admitted for surgery with dx spondylolisthesis, stenosis, and radiculopathy. Following uncomplicated J2-4, Q6-S3 MAS PLIF, she recovered well in Neuro PACU and transferred to 4N for nursing care and therapies. She has progressed steadily.  Consults: None  Significant Diagnostic Studies: radiology: X-Ray: intra-operative  Treatments: surgery: L4-5 L5-S1 FOR MAXIMUM ACCESS (MAS) POSTERIOR LUMBAR INTERBODY FUSION  (N/A) - L4-5 L5-S1 FOR MAXIMUM ACCESS (MAS) POSTERIOR LUMBAR INTERBODY FUSION  L 45 and L 5 S 1 fusion with pedicle screw fixation and posterolateral arthrodesis   Discharge Exam: Blood pressure 100/65, pulse 70, temperature 97.9 F (36.6 C), temperature source Oral, resp. rate 16, SpO2 95 %. Alert, sitting in chair. Reports persistent lumbar spasms with activity/certain movements. Reassured. Small BM yesterday. Good strength BLE. Drsg intact. Incision without erythema, swelling, or drainage.    Disposition: Discharge to SNF, [facility]. Rx's Robaxin 500mg  1 po TID prn spasm #60 & Percocet 5/325 1-2 po q4-6hrs prn pain #60 to chart. Pt verbalizes understanding of d/c instructions and agrees to call office to schedule 3-4 week f/u  appt.      Medication List    TAKE these medications        aspirin EC 81 MG tablet  Take 81 mg by mouth daily.     BLACK COHOSH PO  Take 1 tablet by mouth daily.     CALCIUM PO  Take 1 tablet by mouth daily. 600mg      citalopram 20 MG tablet  Commonly known as:  CELEXA  Take 20 mg by mouth every morning.     CRANBERRY PO  Take 1 tablet by mouth daily.     ferrous sulfate 325 (65 FE) MG tablet  Take 325 mg by mouth daily.     furosemide 40 MG tablet  Commonly known as:  LASIX  Take 40 mg by mouth daily as needed for edema.     gabapentin 400 MG capsule  Commonly known as:  NEURONTIN  Take 1 capsule (400 mg total) by mouth 4 (four) times daily.     HYDROcodone-acetaminophen 5-325 MG per tablet  Commonly known as:  NORCO  Take 1 tablet by mouth every 6 (six) hours as needed for severe pain.     MELATONIN PO  Take 1 tablet by mouth at bedtime as needed. For sleep     metoCLOPramide 5 MG tablet  Commonly known as:  REGLAN  Take 5 mg by mouth every 6 (six) hours as needed for nausea.     metoprolol tartrate 25 MG tablet  Commonly known as:  LOPRESSOR  Take 12.5 mg by mouth 2 (two) times daily. Takes 1/2 tablet     multivitamins ther. w/minerals Tabs tablet  Take 1 tablet by mouth daily.     nitroGLYCERIN 0.4 MG SL tablet  Commonly known  as:  NITROSTAT  Place 0.4 mg under the tongue every 5 (five) minutes as needed for chest pain.     nystatin-triamcinolone cream  Commonly known as:  MYCOLOG II  Apply 1 application topically daily.     ondansetron 4 MG disintegrating tablet  Commonly known as:  ZOFRAN ODT  Take 1 tablet (4 mg total) by mouth every 8 (eight) hours as needed for nausea or vomiting.     potassium chloride SA 20 MEQ tablet  Commonly known as:  K-DUR,KLOR-CON  Take 20 mEq by mouth 2 (two) times daily.     potassium chloride SA 20 MEQ tablet  Commonly known as:  K-DUR,KLOR-CON  Take 1 tablet (20 mEq total) by mouth once.     promethazine  25 MG tablet  Commonly known as:  PHENERGAN  Take 25 mg by mouth every 6 (six) hours as needed for nausea.     topiramate 50 MG tablet  Commonly known as:  TOPAMAX  Take 1 tablet (50 mg total) by mouth 2 (two) times daily. Afetr 1 month, increase to 100 mg twice daily.     traMADol 50 MG tablet  Commonly known as:  ULTRAM  Take 1 tablet (50 mg total) by mouth 4 (four) times daily.     vitamin C 500 MG tablet  Commonly known as:  ASCORBIC ACID  Take 500 mg by mouth daily.     VITAMIN D (CHOLECALCIFEROL) PO  Take 1,200 Units by mouth daily.         Signed: Verdis Prime 06/11/2014, 7:56 AM

## 2014-06-11 NOTE — Progress Notes (Signed)
Physical Therapy Treatment Patient Details Name: [name] MRN: [id number] DOB: 16-Jun-1955 Today's Date: 06/11/2014    History of Present Illness Pt underwent PLIF 06-07-14.    PT Comments    Patient progressing slowly with mobility. Continues to have difficulty with bed mobility and transfers. Ambulation distance limited today due to increased pain and fatigue. Able to verbalize back precautions independently and demonstrate during mobility. To d/c to SNF today.    Follow Up Recommendations  SNF;Supervision/Assistance - 24 hour     Equipment Recommendations  None recommended by PT    Recommendations for Other Services       Precautions / Restrictions Precautions Precautions: Back;Fall Precaution Comments: Able to verbalize 3/3 precautions. Required Braces or Orthoses: Spinal Brace Spinal Brace: Lumbar corset;Applied in sitting position Restrictions Weight Bearing Restrictions: No    Mobility  Bed Mobility Overal bed mobility: Needs Assistance Bed Mobility: Rolling;Sidelying to Sit;Sit to Sidelying Rolling: Min guard Sidelying to sit: Min assist;HOB elevated     Sit to sidelying: Min assist General bed mobility comments: Use of rails for support to get to EOB. Min A for trunk elevation. Min A to bring LLE into bed. Increased time/effort and pain. Good demo of log roll.  Transfers Overall transfer level: Needs assistance Equipment used: Rolling walker (2 wheeled) Transfers: Sit to/from Stand Sit to Stand: Mod assist;From elevated surface         General transfer comment: Mod A to rise from EOB. Cues for hand placement and technique. Increased time.  Ambulation/Gait Ambulation/Gait assistance: Min assist Ambulation Distance (Feet): 75 Feet Assistive device: Rolling walker (2 wheeled) Gait Pattern/deviations: Step-through pattern;Shuffle;Trunk flexed;Decreased stride length Gait velocity: decreased   General Gait Details: Pt with slow, unsteady gait.  Cues for upright posture. Multiple standing rest breaks due to fatigue/pain.   Stairs            Wheelchair Mobility    Modified Rankin (Stroke Patients Only)       Balance Overall balance assessment: Needs assistance Sitting-balance support: Feet supported;No upper extremity supported Sitting balance-Leahy Scale: Fair Sitting balance - Comments: ABle to donn LSO sitting EOB with setup.   Standing balance support: During functional activity Standing balance-Leahy Scale: Poor                      Cognition Arousal/Alertness: Awake/alert Behavior During Therapy: WFL for tasks assessed/performed Overall Cognitive Status: Within Functional Limits for tasks assessed                      Exercises      General Comments        Pertinent Vitals/Pain Pain Assessment: 0-10 Pain Score: 8  Pain Location: back Pain Descriptors / Indicators: Sore Pain Intervention(s): Limited activity within patient's tolerance;Monitored during session;Repositioned    Home Living                      Prior Function            PT Goals (current goals can now be found in the care plan section) Progress towards PT goals: Progressing toward goals    Frequency  Min 5X/week    PT Plan Current plan remains appropriate    Co-evaluation             End of Session Equipment Utilized During Treatment: Gait belt;Back brace Activity Tolerance: Patient limited by pain Patient left: in bed;with call bell/phone within reach;with bed alarm set  Time: 0511-0211 PT Time Calculation (min) (ACUTE ONLY): 24 min  Charges:  $Gait Training: 8-22 mins $Therapeutic Activity: 8-22 mins                    G CodesCandy Sledge A Jul 06, 2014, 10:37 AM Candy Sledge, PT, DPT [phone]

## 2014-06-11 NOTE — Progress Notes (Signed)
Subjective: Patient reports "I still have those spasms, but I'm ready to go"  Objective: Vital signs in last 24 hours: Temp:  [97.8 F (36.6 C)-99.3 F (37.4 C)] 97.9 F (36.6 C) (02/23 0549) Pulse Rate:  [62-86] 70 (02/23 0549) Resp:  [16-20] 16 (02/23 0549) BP: (95-120)/(54-66) 100/65 mmHg (02/23 0549) SpO2:  [95 %-99 %] 95 % (02/23 0549)  Intake/Output from previous day:   Intake/Output this shift:    Alert, sitting in chair. Reports persistent lumbar spasms with activity/certain movements. Reassured. Small BM yesterday. Good strength BLE. Drsg intact. Incision without erythema, swelling, or drainage.   Lab Results: No results for input(s): WBC, HGB, HCT, PLT in the last 72 hours. BMET No results for input(s): NA, K, CL, CO2, GLUCOSE, BUN, CREATININE, CALCIUM in the last 72 hours.  Studies/Results: No results found.  Assessment/Plan: Improving    LOS: 4 days  D/c IV, d/c to SNF, [facility] Rx's Robaxin 500mg  1 po TID prn spasm #60 & Percocet 5/325 1-2 po q4-6hrs prn pain #60 to chart. Pt verbalizes understanding of d/c instructions and agrees to call office to schedule 3-4 week f/u appt.   Verdis Prime 06/11/2014, 7:52 AM

## 2014-06-11 NOTE — Progress Notes (Signed)
Patient DC via ambulance to [facility] at 1325. All belongings packed to patient satisfaction. Finally able to reach nurse for report at 1610. All questions answered.

## 2014-06-11 NOTE — Progress Notes (Signed)
Attempt to call report X2 without success.

## 2014-06-11 NOTE — Progress Notes (Signed)
Transport here to pick up patient. Folder with DC instructions provided to transport technicians. Remain unable to contact receiving nurse to give report.

## 2014-06-12 ENCOUNTER — Non-Acute Institutional Stay (SKILLED_NURSING_FACILITY): Payer: Commercial Managed Care - HMO | Admitting: Registered Nurse

## 2014-06-12 ENCOUNTER — Encounter: Payer: Self-pay | Admitting: Registered Nurse

## 2014-06-12 DIAGNOSIS — D509 Iron deficiency anemia, unspecified: Secondary | ICD-10-CM

## 2014-06-12 DIAGNOSIS — M4316 Spondylolisthesis, lumbar region: Secondary | ICD-10-CM | POA: Diagnosis not present

## 2014-06-12 DIAGNOSIS — G4700 Insomnia, unspecified: Secondary | ICD-10-CM

## 2014-06-12 DIAGNOSIS — F32A Depression, unspecified: Secondary | ICD-10-CM

## 2014-06-12 DIAGNOSIS — R600 Localized edema: Secondary | ICD-10-CM | POA: Diagnosis not present

## 2014-06-12 DIAGNOSIS — F329 Major depressive disorder, single episode, unspecified: Secondary | ICD-10-CM | POA: Diagnosis not present

## 2014-06-12 DIAGNOSIS — K5901 Slow transit constipation: Secondary | ICD-10-CM

## 2014-06-12 DIAGNOSIS — I10 Essential (primary) hypertension: Secondary | ICD-10-CM | POA: Diagnosis not present

## 2014-06-12 MED FILL — Heparin Sodium (Porcine) Inj 1000 Unit/ML: INTRAMUSCULAR | Qty: 30 | Status: AC

## 2014-06-12 MED FILL — Sodium Chloride IV Soln 0.9%: INTRAVENOUS | Qty: 2000 | Status: AC

## 2014-06-12 NOTE — Progress Notes (Signed)
Patient ID: [name], female   DOB: 07/10/1955, 59 y.o.   MRN: [id number]   Place of Service: [facility]  Allergies  Allergen Reactions  . Sulfonamide Derivatives Rash    REACTION: Syncope    Code Status: Full Code  Goals of Care: Longevity/STR  Chief Complaint  Patient presents with  . Hospitalization Follow-up    HPI 59 y.o. female with PMH of chronic back pain, fibromyalgia, depression, insomnia, anxiety among others is being seen for a follow-up visit post hospital admission from 06/07/14 to 06/11/14 with spondylolisthesis of lumbar region. She underwent L4-5, L5-S1 MAS PLIF. She is here for short term rehab and her goal is to return home. Seen in room today. Reported having lower back and bil leg spasms. Also reported issue with constipation. Last BM 3 days ago. Denies any other concerns   Review of Systems Constitutional: Negative for fever, chills, and fatigue. HENT: Negative for ear pain, congestion, and sore throat Eyes: Negative for eye pain, eye discharge, and visual disturbance  Cardiovascular: Negative for chest pain. Positive for leg swelling Respiratory: Negative cough, shortness of breath, and wheezing.  Gastrointestinal: Negative for nausea and vomiting. Positive for constipation Genitourinary: Negative for  dysuria and hematuria Musculoskeletal: Positive for back pain and spasms and bil leg spasms  Neurological: Negative for dizziness and headache Skin: Negative for rash and itchiness.   Psychiatric: Negative for depression  Past Medical History  Diagnosis Date  . Depression   . Fibromyalgia   . Dysrhythmia     "heart tends to flutter"  . Sleep apnea     no cpap used in many yrs after weight lost-no machine now  . Heart murmur   . DVT (deep venous thrombosis) 01-17-13    past hx. -tx.5-6 yrs ago bilateral legs, occ. sporadic swelling, has IVC filter implanted  . Anxiety     takes Citalopram daily  . Anemia     takes Ferrous Sulfate  daily  . Insomnia     takes Melatonin daily  . Hypertension     takes Metoprolol daily  . Complication of anesthesia yrs ago    slow to wake up  . CHF (congestive heart failure)     takes Lasix daily as needed  . Asthma     2004-prior to gastric bypass and no problems since  . Pneumonia 90's    hx of  . History of bronchitis 2012 or 2013  . Peripheral neuropathy   . Arthritis   . Joint pain   . Joint swelling   . Fracture     right foot and is in a cam boot  . Chronic back pain     spondylolisthesis/stenosis/radiculopathy  . GERD (gastroesophageal reflux disease)     hx of-no meds now  . History of colon polyps   . Urinary frequency   . Urinary urgency     Past Surgical History  Procedure Laterality Date  . Gastric by-pass  20004  . Esophagogastroduodenoscopy (egd) with propofol  03/01/2012    Procedure: ESOPHAGOGASTRODUODENOSCOPY (EGD) WITH PROPOFOL;  Surgeon: Arta Silence, MD;  Location: [facility];  Service: Endoscopy;  Laterality: N/A;  . Tonsillectomy      as child  . Dilation and curettage of uterus  yrs ago  . Hernia repair  2005  . Cholecystectomy  2007  . Ligament repair Right 1987    Rt. knee scope  . Insertion of vena cava filter  01-17-13    inserted 2004- "abdomen"  .  Esophagogastroduodenoscopy (egd) with propofol N/A 01/31/2013    Procedure: ESOPHAGOGASTRODUODENOSCOPY (EGD) WITH PROPOFOL;  Surgeon: Arta Silence, MD;  Location: [facility];  Service: Endoscopy;  Laterality: N/A;  . Balloon dilation N/A 01/31/2013    Procedure: BALLOON DILATION;  Surgeon: Arta Silence, MD;  Location: [facility];  Service: Endoscopy;  Laterality: N/A;  . Colonoscopy    . Maximum access (mas)posterior lumbar interbody fusion (plif) 2 level N/A 06/07/2014    Procedure: L4-5 L5-S1 FOR MAXIMUM ACCESS (MAS) POSTERIOR LUMBAR INTERBODY FUSION ;  Surgeon: Erline Levine, MD;  Location: [place] [facility];  Service: Neurosurgery;  Laterality: N/A;  L4-5 L5-S1 FOR MAXIMUM ACCESS  (MAS) POSTERIOR LUMBAR INTERBODY FUSION     History  Substance Use Topics  . Smoking status: Former Smoker -- 0.50 packs/day for 10 years    Types: Cigarettes    Quit date: 04/19/2009  . Smokeless tobacco: Never Used  . Alcohol Use: Yes     Comment: occ. social- wine    Family History  Problem Relation Age of Onset  . Diabetes Mother   . Heart disease Father   . Hypertension Father       Medication List       This list is accurate as of: 06/12/14  4:53 PM.  Always use your most recent med list.               aspirin EC 81 MG tablet  Take 81 mg by mouth daily.     bisacodyl 10 MG suppository  Commonly known as:  DULCOLAX  Place 10 mg rectally as needed for moderate constipation or severe constipation.     BLACK COHOSH PO  Take 1 tablet by mouth daily.     CALCIUM PO  Take 1 tablet by mouth daily. 600mg      citalopram 20 MG tablet  Commonly known as:  CELEXA  Take 20 mg by mouth every morning.     CRANBERRY PO  Take 1 tablet by mouth daily.     ferrous sulfate 325 (65 FE) MG tablet  Take 325 mg by mouth daily.     furosemide 40 MG tablet  Commonly known as:  LASIX  Take 40 mg by mouth daily as needed for edema.     gabapentin 400 MG capsule  Commonly known as:  NEURONTIN  Take 1 capsule (400 mg total) by mouth 4 (four) times daily.     HYDROcodone-acetaminophen 5-325 MG per tablet  Commonly known as:  NORCO  Take 1 tablet by mouth every 6 (six) hours as needed for severe pain.     MELATONIN PO  Take 1 tablet by mouth at bedtime as needed. For sleep     methocarbamol 500 MG tablet  Commonly known as:  ROBAXIN  Take 500 mg by mouth every 6 (six) hours as needed for muscle spasms.     metoCLOPramide 5 MG tablet  Commonly known as:  REGLAN  Take 5 mg by mouth every 6 (six) hours as needed for nausea.     metoprolol tartrate 25 MG tablet  Commonly known as:  LOPRESSOR  Take 12.5 mg by mouth 2 (two) times daily. Takes 1/2 tablet     multivitamins  ther. w/minerals Tabs tablet  Take 1 tablet by mouth daily.     nitroGLYCERIN 0.4 MG SL tablet  Commonly known as:  NITROSTAT  Place 0.4 mg under the tongue every 5 (five) minutes as needed for chest pain.     nystatin-triamcinolone cream  Commonly known as:  MYCOLOG II  Apply 1 application topically daily.     ondansetron 4 MG disintegrating tablet  Commonly known as:  ZOFRAN ODT  Take 1 tablet (4 mg total) by mouth every 8 (eight) hours as needed for nausea or vomiting.     polyethylene glycol packet  Commonly known as:  MIRALAX / GLYCOLAX  Take 17 g by mouth daily.     potassium chloride SA 20 MEQ tablet  Commonly known as:  K-DUR,KLOR-CON  Take 20 mEq by mouth 2 (two) times daily.     promethazine 25 MG tablet  Commonly known as:  PHENERGAN  Take 25 mg by mouth every 6 (six) hours as needed for nausea.     senna-docusate 8.6-50 MG per tablet  Commonly known as:  Senokot-S  Take 1 tablet by mouth at bedtime.     topiramate 100 MG tablet  Commonly known as:  TOPAMAX  Take 100 mg by mouth 2 (two) times daily.     traMADol 50 MG tablet  Commonly known as:  ULTRAM  Take 1 tablet (50 mg total) by mouth 4 (four) times daily.     vitamin C 500 MG tablet  Commonly known as:  ASCORBIC ACID  Take 500 mg by mouth daily.     VITAMIN D (CHOLECALCIFEROL) PO  Take 1,200 Units by mouth daily.        Physical Exam  BP 148/84 mmHg  Pulse 100  Temp(Src) 97 F (36.1 C)  Resp 20  Ht 5\' 5"  (1.651 m)  SpO2 96%  Constitutional: Obese adult female in no acute distress. Conversant and pleasant HEENT: Normocephalic and atraumatic. PERRL. EOM intact. No icterus. Oral mucosa moist. Posterior pharynx clear of any exudate or lesions.  Neck: Supple and nontender. No lymphadenopathy, masses, or thyromegaly. No JVD or carotid bruits. Cardiac: Normal S1, S2. RRR without appreciable murmurs, rubs, or gallops. Distal pulses intact. Trace dependent edema.  Lungs: No respiratory distress.  Breath sounds clear bilaterally without rales, rhonchi, or wheezes. Abdomen: Audible bowel sounds in all quadrants. Soft, nontender, nondistended. Musculoskeletal: able to move all extremities. Lower spine tender to palpation. Back brace in place. Surgical incision w/o signs of infection. Skin: Warm and dry. Neurological: Alert and oriented to person, place, and time.  Psychiatric: Appropriate mood and affect.   Labs Reviewed  CBC Latest Ref Rng 06/07/2014 05/26/2014 05/15/2014  WBC 4.0 - 10.5 K/uL - 10.9(H) 5.2  Hemoglobin 12.0 - 15.0 g/dL 12.2 15.5(H) 11.8(L)  Hematocrit 36.0 - 46.0 % 36.0 45.3 35.6(L)  Platelets 150 - 400 K/uL - 215 196    CMP Latest Ref Rng 06/07/2014 05/26/2014 05/15/2014  Glucose 70 - 99 mg/dL 77 135(H) 92  BUN 6 - 23 mg/dL - 19 16  Creatinine 0.50 - 1.10 mg/dL - 0.91 0.98  Sodium 135 - 145 mmol/L 146(H) 140 143  Potassium 3.5 - 5.1 mmol/L 3.4(L) 2.9(L) 3.9  Chloride 96 - 112 mmol/L - 106 116(H)  CO2 19 - 32 mmol/L - 23 19  Calcium 8.4 - 10.5 mg/dL - 9.4 8.7  Total Protein 6.0 - 8.3 g/dL - 7.4 -  Total Bilirubin 0.3 - 1.2 mg/dL - 0.7 -  Alkaline Phos 39 - 117 U/L - 99 -  AST 0 - 37 U/L - 36 -  ALT 0 - 35 U/L - 34 -    No results found for: HGBA1C  Lab Results  Component Value Date   TSH 1.778 02/20/2011    Lipid  Panel     Component Value Date/Time   CHOL 126 05/22/2013 0100   TRIG 37 05/22/2013 0100   TRIG 70 02/18/2006 0933   HDL 68 05/22/2013 0100   CHOLHDL 1.9 05/22/2013 0100   CHOLHDL 2.2 CALC 02/18/2006 0933   VLDL 7 05/22/2013 0100   LDLCALC 51 05/22/2013 0100   Assessment & Plan 1. Essential hypertension Slightly elevated. Continue lopressor 12.5mg  twice daily for now. If BP continues to high, will increase lopressor dosage  2. Spondylolisthesis of lumbar region S/p L4-5, L5-S1 MAS PLIF. Continue to work with PT/OT for gait/strength/balance training to restore/maximize function. Continue tramadol 50mg  four times daily, topamax 100mg  twice  daily, and norco 5/325mg  every six hours as needed for pain. Start robaxin 500mg  every six hours as needed for muscle spasms. Continue to monitor. Fall risk precautions  3. Bilateral leg edema Continue lasix 40mg  daily as needed for swelling. On Potassium supplement right now. Recheck bmet-if normal will discontinue potassium supplement  4. Insomnia Continue melatonin at bedtime.   5. Slow transit constipation Start senna 8.6/50mg  2 tabs daily with miralax 17g daily and dulcolax 10mg  PR as needed for moderate to sever constipation. Encourage hydration and ambulation as tolerated. Continue to monitor.   6. Anemia, iron deficiency Stable. Continue iron supplement and monitor h&h  7. Depression Continue celexa 20mg  daily and monitor for change in mood.    Diagnostic Studies/Labs Ordered: cbc, bmet   Time spent: 40 minutes on care coordination   Family/Staff Communication Plan of care discussed with patient and nursing staff. Patient and nursing staff verbalized understanding and agree with plan of care. No additional questions or concerns reported.    Arthur Holms, MSN, AGNP-C [facility] [address] [place], [place] [address] [phone] [8am-5pm] After hours: [phone]

## 2014-06-13 ENCOUNTER — Non-Acute Institutional Stay (SKILLED_NURSING_FACILITY): Payer: Commercial Managed Care - HMO | Admitting: Internal Medicine

## 2014-06-13 DIAGNOSIS — I10 Essential (primary) hypertension: Secondary | ICD-10-CM

## 2014-06-13 DIAGNOSIS — R112 Nausea with vomiting, unspecified: Secondary | ICD-10-CM | POA: Diagnosis not present

## 2014-06-13 DIAGNOSIS — D509 Iron deficiency anemia, unspecified: Secondary | ICD-10-CM

## 2014-06-13 DIAGNOSIS — M4316 Spondylolisthesis, lumbar region: Secondary | ICD-10-CM

## 2014-06-13 DIAGNOSIS — F329 Major depressive disorder, single episode, unspecified: Secondary | ICD-10-CM | POA: Diagnosis not present

## 2014-06-13 DIAGNOSIS — K5901 Slow transit constipation: Secondary | ICD-10-CM | POA: Diagnosis not present

## 2014-06-13 DIAGNOSIS — G4700 Insomnia, unspecified: Secondary | ICD-10-CM

## 2014-06-13 DIAGNOSIS — R600 Localized edema: Secondary | ICD-10-CM

## 2014-06-13 DIAGNOSIS — F32A Depression, unspecified: Secondary | ICD-10-CM

## 2014-06-14 ENCOUNTER — Non-Acute Institutional Stay (SKILLED_NURSING_FACILITY): Payer: Commercial Managed Care - HMO | Admitting: Registered Nurse

## 2014-06-14 DIAGNOSIS — F32A Depression, unspecified: Secondary | ICD-10-CM

## 2014-06-14 DIAGNOSIS — R1013 Epigastric pain: Secondary | ICD-10-CM

## 2014-06-14 DIAGNOSIS — R110 Nausea: Secondary | ICD-10-CM

## 2014-06-14 DIAGNOSIS — F329 Major depressive disorder, single episode, unspecified: Secondary | ICD-10-CM | POA: Diagnosis not present

## 2014-06-14 NOTE — Progress Notes (Signed)
Patient ID: [name], female   DOB: 07-05-1955, 59 y.o.   MRN: [id number]   Place of Service: [facility]  Allergies  Allergen Reactions  . Sulfonamide Derivatives Rash    REACTION: Syncope    Code Status: Full Code  Goals of Care: Longevity/STR  Chief Complaint  Patient presents with  . Acute Visit    nausea, abdominal pain,  depression    HPI 59 y.o. female with PMH of chronic back pain, fibromyalgia, depression, insomnia, anxiety, spondylolisthesis of lumbar region s/p L4-5, L5-S1 MAS PLIF among others is being seen for an acute visit at the request of nursing staff for the evaluation of nausea, abdominal pain, and poor appetite. Seen in room today. Patient reported having nausea over the past couple days with abdominal pain. Has large BM last night and this morning.  Denies any vomiting. Nausea and pain is not associated with eating. Patient also admitted to have issue with depression and feels that her current medication is ineffective and would like to try something different. Denies any suicidal ideations.   Review of Systems Constitutional: Negative for fever, chills, and fatigue. HENT: Negative for ear pain, congestion, and sore throat Eyes: Negative for eye pain, eye discharge, and visual disturbance  Cardiovascular: Negative for chest pain. Positive for leg swelling Respiratory: Negative cough, shortness of breath, and wheezing.  Gastrointestinal: See HPI Genitourinary: Negative for  dysuria and hematuria Musculoskeletal: Positive for back pain and spasms and bil leg spasms  Neurological: Negative for dizziness and headache Skin: Negative for rash and itchiness.   Psychiatric: See HPI  Past Medical History  Diagnosis Date  . Depression   . Fibromyalgia   . Dysrhythmia     "heart tends to flutter"  . Sleep apnea     no cpap used in many yrs after weight lost-no machine now  . Heart murmur   . DVT (deep venous thrombosis) 01-17-13    past hx.  -tx.5-6 yrs ago bilateral legs, occ. sporadic swelling, has IVC filter implanted  . Anxiety     takes Citalopram daily  . Anemia     takes Ferrous Sulfate daily  . Insomnia     takes Melatonin daily  . Hypertension     takes Metoprolol daily  . Complication of anesthesia yrs ago    slow to wake up  . CHF (congestive heart failure)     takes Lasix daily as needed  . Asthma     2004-prior to gastric bypass and no problems since  . Pneumonia 90's    hx of  . History of bronchitis 2012 or 2013  . Peripheral neuropathy   . Arthritis   . Joint pain   . Joint swelling   . Fracture     right foot and is in a cam boot  . Chronic back pain     spondylolisthesis/stenosis/radiculopathy  . GERD (gastroesophageal reflux disease)     hx of-no meds now  . History of colon polyps   . Urinary frequency   . Urinary urgency     Past Surgical History  Procedure Laterality Date  . Gastric by-pass  20004  . Esophagogastroduodenoscopy (egd) with propofol  03/01/2012    Procedure: ESOPHAGOGASTRODUODENOSCOPY (EGD) WITH PROPOFOL;  Surgeon: Arta Silence, MD;  Location: [facility];  Service: Endoscopy;  Laterality: N/A;  . Tonsillectomy      as child  . Dilation and curettage of uterus  yrs ago  . Hernia repair  2005  .  Cholecystectomy  2007  . Ligament repair Right 1987    Rt. knee scope  . Insertion of vena cava filter  01-17-13    inserted 2004- "abdomen"  . Esophagogastroduodenoscopy (egd) with propofol N/A 01/31/2013    Procedure: ESOPHAGOGASTRODUODENOSCOPY (EGD) WITH PROPOFOL;  Surgeon: Arta Silence, MD;  Location: [facility];  Service: Endoscopy;  Laterality: N/A;  . Balloon dilation N/A 01/31/2013    Procedure: BALLOON DILATION;  Surgeon: Arta Silence, MD;  Location: [facility];  Service: Endoscopy;  Laterality: N/A;  . Colonoscopy    . Maximum access (mas)posterior lumbar interbody fusion (plif) 2 level N/A 06/07/2014    Procedure: L4-5 L5-S1 FOR MAXIMUM ACCESS (MAS)  POSTERIOR LUMBAR INTERBODY FUSION ;  Surgeon: Erline Levine, MD;  Location: [facility];  Service: Neurosurgery;  Laterality: N/A;  L4-5 L5-S1 FOR MAXIMUM ACCESS (MAS) POSTERIOR LUMBAR INTERBODY FUSION     History  Substance Use Topics  . Smoking status: Former Smoker -- 0.50 packs/day for 10 years    Types: Cigarettes    Quit date: 04/19/2009  . Smokeless tobacco: Never Used  . Alcohol Use: Yes     Comment: occ. social- wine    Family History  Problem Relation Age of Onset  . Diabetes Mother   . Heart disease Father   . Hypertension Father       Medication List       This list is accurate as of: 06/14/14  5:10 PM.  Always use your most recent med list.               aspirin EC 81 MG tablet  Take 81 mg by mouth daily.     bisacodyl 10 MG suppository  Commonly known as:  DULCOLAX  Place 10 mg rectally as needed for moderate constipation or severe constipation.     BLACK COHOSH PO  Take 1 tablet by mouth daily.     CALCIUM PO  Take 1 tablet by mouth daily. 600mg      CRANBERRY PO  Take 1 tablet by mouth daily.     DULoxetine 30 MG capsule  Commonly known as:  CYMBALTA  Take 30 mg by mouth daily.     ferrous sulfate 325 (65 FE) MG tablet  Take 325 mg by mouth daily.     furosemide 40 MG tablet  Commonly known as:  LASIX  Take 40 mg by mouth daily as needed for edema.     gabapentin 400 MG capsule  Commonly known as:  NEURONTIN  Take 1 capsule (400 mg total) by mouth 4 (four) times daily.     HYDROcodone-acetaminophen 5-325 MG per tablet  Commonly known as:  NORCO  Take 1 tablet by mouth every 6 (six) hours as needed for severe pain.     MELATONIN PO  Take 1 tablet by mouth at bedtime as needed. For sleep     methocarbamol 500 MG tablet  Commonly known as:  ROBAXIN  Take 500 mg by mouth every 6 (six) hours as needed for muscle spasms.     metoCLOPramide 5 MG tablet  Commonly known as:  REGLAN  Take 5 mg by mouth every 6 (six) hours as needed for  nausea.     metoprolol tartrate 25 MG tablet  Commonly known as:  LOPRESSOR  Take 25 mg by mouth 2 (two) times daily.     multivitamins ther. w/minerals Tabs tablet  Take 1 tablet by mouth daily.     nitroGLYCERIN 0.4 MG SL tablet  Commonly known as:  NITROSTAT  Place 0.4 mg under the tongue every 5 (five) minutes as needed for chest pain.     nystatin-triamcinolone cream  Commonly known as:  MYCOLOG II  Apply 1 application topically daily.     ondansetron 4 MG disintegrating tablet  Commonly known as:  ZOFRAN ODT  Take 1 tablet (4 mg total) by mouth every 8 (eight) hours as needed for nausea or vomiting.     pantoprazole 40 MG tablet  Commonly known as:  PROTONIX  Take 40 mg by mouth daily.     polyethylene glycol packet  Commonly known as:  MIRALAX / GLYCOLAX  Take 17 g by mouth daily.     potassium chloride SA 20 MEQ tablet  Commonly known as:  K-DUR,KLOR-CON  Take 20 mEq by mouth 2 (two) times daily.     promethazine 25 MG tablet  Commonly known as:  PHENERGAN  Take 25 mg by mouth every 6 (six) hours as needed for nausea.     senna-docusate 8.6-50 MG per tablet  Commonly known as:  Senokot-S  Take 1 tablet by mouth at bedtime.     topiramate 100 MG tablet  Commonly known as:  TOPAMAX  Take 100 mg by mouth 2 (two) times daily.     traMADol 50 MG tablet  Commonly known as:  ULTRAM  Take 1 tablet (50 mg total) by mouth 4 (four) times daily.     vitamin C 500 MG tablet  Commonly known as:  ASCORBIC ACID  Take 500 mg by mouth daily.     VITAMIN D (CHOLECALCIFEROL) PO  Take 1,200 Units by mouth daily.        Physical Exam  BP 130/70 mmHg  Pulse 80  Temp(Src) 97 F (36.1 C)  Resp 21  Constitutional: Obese adult female in no acute distress. Conversant and pleasant HEENT: Normocephalic and atraumatic. PERRL. EOM intact. No icterus. Oral mucosa moist. Posterior pharynx clear of any exudate or lesions.  Neck: Supple and nontender. No lymphadenopathy,  masses, or thyromegaly. No JVD or carotid bruits. Cardiac: Normal S1, S2. RRR without appreciable murmurs, rubs, or gallops. Distal pulses intact. Trace dependent edema.  Lungs: No respiratory distress. Breath sounds clear bilaterally without rales, rhonchi, or wheezes. Abdomen: Bowel sounds diminished in all quadrants. Soft. Epigastric area tender to palpation. No rebound tenderness.  Musculoskeletal: able to move all extremities. Back brace in place.  Skin: Warm and dry. Neurological: Alert and oriented to person, place, and time.  Psychiatric: slightly withdrawn.   Labs Reviewed  CBC Latest Ref Rng 06/07/2014 05/26/2014 05/15/2014  WBC 4.0 - 10.5 K/uL - 10.9(H) 5.2  Hemoglobin 12.0 - 15.0 g/dL 12.2 15.5(H) 11.8(L)  Hematocrit 36.0 - 46.0 % 36.0 45.3 35.6(L)  Platelets 150 - 400 K/uL - 215 196    CMP Latest Ref Rng 06/07/2014 05/26/2014 05/15/2014  Glucose 70 - 99 mg/dL 77 135(H) 92  BUN 6 - 23 mg/dL - 19 16  Creatinine 0.50 - 1.10 mg/dL - 0.91 0.98  Sodium 135 - 145 mmol/L 146(H) 140 143  Potassium 3.5 - 5.1 mmol/L 3.4(L) 2.9(L) 3.9  Chloride 96 - 112 mmol/L - 106 116(H)  CO2 19 - 32 mmol/L - 23 19  Calcium 8.4 - 10.5 mg/dL - 9.4 8.7  Total Protein 6.0 - 8.3 g/dL - 7.4 -  Total Bilirubin 0.3 - 1.2 mg/dL - 0.7 -  Alkaline Phos 39 - 117 U/L - 99 -  AST 0 - 37 U/L -  36 -  ALT 0 - 35 U/L - 34 -    No results found for: HGBA1C  Lab Results  Component Value Date   TSH 1.778 02/20/2011    Lipid Panel     Component Value Date/Time   CHOL 126 05/22/2013 0100   TRIG 37 05/22/2013 0100   TRIG 70 02/18/2006 0933   HDL 68 05/22/2013 0100   CHOLHDL 1.9 05/22/2013 0100   CHOLHDL 2.2 CALC 02/18/2006 0933   VLDL 7 05/22/2013 0100   LDLCALC 51 05/22/2013 0100   Assessment & Plan 1. Abdominal pain, epigastric Start protonix 40mg  daily. Will also get KUB for further evaluation. Encourage fluid and food intake. Continue to monitor for now.   2. Depression Discontinue celexa and start  cymbalta 30mg  daily. Continue to monitor for change in mood. Recommended psychotherapy but patient declined.   3. Nausea without vomiting Continue zofran prn and reglan prn n/v. Encourage to take meds with food. Continue to monitor her status  Family/Staff Communication Plan of care discussed with patient and nursing staff. Patient and nursing staff verbalized understanding and agree with plan of care. No additional questions or concerns reported.    Arthur Holms, MSN, AGNP-C [facility] [address] [place], [place] [address] [phone] [8am-5pm] After hours: [phone]

## 2014-06-21 DIAGNOSIS — M5416 Radiculopathy, lumbar region: Secondary | ICD-10-CM | POA: Diagnosis not present

## 2014-06-21 DIAGNOSIS — M4806 Spinal stenosis, lumbar region: Secondary | ICD-10-CM | POA: Diagnosis not present

## 2014-06-23 NOTE — Progress Notes (Signed)
Patient ID: KIFFANY SCHELLING, female   DOB: 12/24/1955, 59 y.o.   MRN: [id number]     Facility: [facility]    PCP: Birdie Riddle, MD   Allergies  Allergen Reactions  . Sulfonamide Derivatives Rash    REACTION: Syncope    Chief Complaint  Patient presents with  . New Admit To SNF     HPI:  59 year old patient is here for short term rehabilitation post hospital admission from 06/07/14-06/11/14 with lumbar stensosis with radiculopathy. She underwent L4-5, L5-S1 MAS PLIF. She is nauseous and has vomited this am. she has not had a bowel movement since Sunday. She feels sleepy and tired. Her pain is under control. Her bp has been elevated She has PMH of chronic back pain, fibromyalgia, depression, insomnia, anxiety.  Review of Systems:  Constitutional: Negative for fever, chills and diaphoresis.  HENT: Negative for headache, congestion Eyes: Negative for eye pain, blurred vision, double vision and discharge.  Respiratory: Negative for cough, shortness of breath and wheezing.   Cardiovascular: Negative for chest pain, palpitations, leg swelling.  Gastrointestinal: Negative for heartburn, abdominal pain Genitourinary: Negative for dysuria.  Musculoskeletal: Negative for back pain, falls Skin: Negative for itching, rash.  Neurological: Negative for dizziness, tingling, focal weakness Psychiatric/Behavioral: Negative for depression   Past Medical History  Diagnosis Date  . Depression   . Fibromyalgia   . Dysrhythmia     "heart tends to flutter"  . Sleep apnea     no cpap used in many yrs after weight lost-no machine now  . Heart murmur   . DVT (deep venous thrombosis) 01-17-13    past hx. -tx.5-6 yrs ago bilateral legs, occ. sporadic swelling, has IVC filter implanted  . Anxiety     takes Citalopram daily  . Anemia     takes Ferrous Sulfate daily  . Insomnia     takes Melatonin daily  . Hypertension     takes Metoprolol daily  . Complication  of anesthesia yrs ago    slow to wake up  . CHF (congestive heart failure)     takes Lasix daily as needed  . Asthma     20 04-prior to gastric bypass and no problems since  . Pneumonia 90's    hx of  . History of bronchitis 2012 or 2013  . Peripheral neuropathy   . Arthritis   . Joint pain   . Joint swelling   . Fracture     right foot and is in a cam boot  . Chronic back pain     spondylolisthesis/stenosis/radiculopathy  . GERD (gastroesophageal reflux disease)     hx of-no meds now  . History of colon polyps   . Urinary frequency   . Urinary urgency    Past Surgical History  Procedure Laterality Date  . Gastric by-pass  20004  . Esophagogastroduodenoscopy (egd) with propofol  03/01/2012    Procedure: ESOPHAGOGASTRODUODENOSCOPY (EGD) WITH PROPOFOL;  Surgeon: Arta Silence, MD;  Location: [facility];  Service: Endoscopy;  Laterality: N/A;  . Tonsillectomy      as child  . Dilation and curettage of uterus  yrs ago  . Hernia repair  2005  . Cholecystectomy  2007  . Ligament repair Right 1987    Rt. knee scope  . Insertion of vena cava filter  01-17-13    inserted 2004- "abdomen"  . Esophagogastroduodenoscopy (egd) with propofol N/A 01/31/2013    Procedure: ESOPHAGOGASTRODUODENOSCOPY (EGD) WITH PROPOFOL;  Surgeon: Arta Silence,  MD;  Location: [facility];  Service: Endoscopy;  Laterality: N/A;  . Balloon dilation N/A 01/31/2013    Procedure: BALLOON DILATION;  Surgeon: Arta Silence, MD;  Location: [facility];  Service: Endoscopy;  Laterality: N/A;  . Colonoscopy    . Maximum access (mas)posterior lumbar interbody fusion (plif) 2 level N/A 06/07/2014    Procedure: L4-5 L5-S1 FOR MAXIMUM ACCESS (MAS) POSTERIOR LUMBAR INTERBODY FUSION ;  Surgeon: Erline Levine, MD;  Location: [facility];  Service: Neurosurgery;  Laterality: N/A;  L4-5 L5-S1 FOR MAXIMUM ACCESS (MAS) POSTERIOR LUMBAR INTERBODY FUSION    Social History:   reports that she quit smoking about 5 years ago.  Her smoking use included Cigarettes. She has a 5 pack-year smoking history. She has never used smokeless tobacco. She reports that she drinks alcohol. She reports that she does not use illicit drugs.  Family History  Problem Relation Age of Onset  . Diabetes Mother   . Heart disease Father   . Hypertension Father     Medications: Patient's Medications  New Prescriptions   No medications on file  Previous Medications   ASPIRIN EC 81 MG TABLET    Take 81 mg by mouth daily.   BISACODYL (DULCOLAX) 10 MG SUPPOSITORY    Place 10 mg rectally as needed for moderate constipation or severe constipation.   BLACK COHOSH PO    Take 1 tablet by mouth daily.   CALCIUM PO    Take 1 tablet by mouth daily. 600mg    CRANBERRY PO    Take 1 tablet by mouth daily.   DULOXETINE (CYMBALTA) 30 MG CAPSULE    Take 30 mg by mouth daily.   FERROUS SULFATE 325 (65 FE) MG TABLET    Take 325 mg by mouth daily.    FUROSEMIDE (LASIX) 40 MG TABLET    Take 40 mg by mouth daily as needed for edema.    GABAPENTIN (NEURONTIN) 400 MG CAPSULE    Take 1 capsule (400 mg total) by mouth 4 (four) times daily.   MELATONIN PO    Take 1 tablet by mouth at bedtime as needed. For sleep   METHOCARBAMOL (ROBAXIN) 500 MG TABLET    Take 500 mg by mouth every 6 (six) hours as needed for muscle spasms.   METOCLOPRAMIDE (REGLAN) 5 MG TABLET    Take 5 mg by mouth every 6 (six) hours as needed for nausea.    METOPROLOL TARTRATE (LOPRESSOR) 25 MG TABLET    Take 25 mg by mouth 2 (two) times daily.    MULTIPLE VITAMINS-MINERALS (MULTIVITAMINS THER. W/MINERALS) TABS    Take 1 tablet by mouth daily.    NITROGLYCERIN (NITROSTAT) 0.4 MG SL TABLET    Place 0.4 mg under the tongue every 5 (five) minutes as needed for chest pain.    NYSTATIN-TRIAMCINOLONE (MYCOLOG II) CREAM    Apply 1 application topically daily.    ONDANSETRON (ZOFRAN ODT) 4 MG DISINTEGRATING TABLET    Take 1 tablet (4 mg total) by mouth every 8 (eight) hours as needed for nausea or  vomiting.   PANTOPRAZOLE (PROTONIX) 40 MG TABLET    Take 40 mg by mouth daily.   POLYETHYLENE GLYCOL (MIRALAX / GLYCOLAX) PACKET    Take 17 g by mouth daily.   POTASSIUM CHLORIDE SA (K-DUR,KLOR-CON) 20 MEQ TABLET    Take 20 mEq by mouth 2 (two) times daily.    PROMETHAZINE (PHENERGAN) 25 MG TABLET    Take 25 mg by mouth every 6 (six) hours  as needed for nausea.   SENNA-DOCUSATE (SENOKOT-S) 8.6-50 MG PER TABLET    Take 1 tablet by mouth at bedtime.   TOPIRAMATE (TOPAMAX) 100 MG TABLET    Take 100 mg by mouth 2 (two) times daily.   VITAMIN C (ASCORBIC ACID) 500 MG TABLET    Take 500 mg by mouth daily.   VITAMIN D, CHOLECALCIFEROL, PO    Take 1,200 Units by mouth daily.  Modified Medications   Modified Medication Previous Medication   HYDROCODONE-ACETAMINOPHEN (NORCO) 5-325 MG PER TABLET HYDROcodone-acetaminophen (NORCO) 5-325 MG per tablet      Take 1 tablet by mouth every 6 (six) hours as needed for severe pain.    Take 1 tablet by mouth every 6 (six) hours as needed for severe pain.   TRAMADOL (ULTRAM) 50 MG TABLET traMADol (ULTRAM) 50 MG tablet      Take 1 tablet (50 mg total) by mouth 4 (four) times daily.    Take 1 tablet (50 mg total) by mouth 4 (four) times daily.  Discontinued Medications   No medications on file     Physical Exam: Filed Vitals:   06/13/14 1658  BP: 150/76  Pulse: 60  Temp: 98.6 F (37 C)  Resp: 16  SpO2: 95%    General- elderly female, in no acute distress Head- normocephalic, atraumatic Throat- moist mucus membrane Neck- no cervical lymphadenopathy Cardiovascular- normal s1,s2, no murmurs, palpable dorsalis pedis, no leg edema Respiratory- bilateral clear to auscultation, no wheeze, no rhonchi, no crackles, no use of accessory muscles Abdomen- bowel sounds present, soft, mild distension, non tender, no guarding or rigidity, no CVA tenderness Musculoskeletal- able to move all 4 extremities Neurological- no focal deficit Skin- warm and dry, back incision  with glue, healing well Psychiatry- alert and oriented to person, place and time, normal mood and affect   Labs reviewed: Basic Metabolic Panel:  Recent Labs  05/15/14 1619 05/26/14 1202 06/07/14 0824  NA 143 140 146*  K 3.9 2.9* 3.4*  CL 116* 106  --   CO2 19 23  --   GLUCOSE 92 135* 77  BUN 16 19  --   CREATININE 0.98 0.91  --   CALCIUM 8.7 9.4  --   MG  --  1.7  --    Liver Function Tests:  Recent Labs  05/26/14 1202  AST 36  ALT 34  ALKPHOS 99  BILITOT 0.7  PROT 7.4  ALBUMIN 4.2    Recent Labs  05/26/14 1202  LIPASE 22   No results for input(s): AMMONIA in the last 8760 hours. CBC:  Recent Labs  05/15/14 1619 05/26/14 1202 06/07/14 0824  WBC 5.2 10.9*  --   NEUTROABS  --  8.3*  --   HGB 11.8* 15.5* 12.2  HCT 35.6* 45.3 36.0  MCV 85.4 85.2  --   PLT 196 215  --    Cardiac Enzymes:  Recent Labs  05/26/14 1202  TROPONINI 0.03   BNP: Invalid input(s): POCBNP CBG:  Recent Labs  06/07/14 1524  GLUCAP 104*    Assessment/Plan  Spondylolisthesis of lumbar region S/p L4-5, L5-S1 MAS PLIF. Continue prn robaxin for muscle spasm and tramadol 50mg  four times daily, topamax 100mg  twice daily, and norco 5/325mg  every six hours as needed for pain. Will have patient work with PT/OT as tolerated to regain strength and restore function.  Fall precautions are in place.  Nausea and vomiting has not had a bowel movement. Will give her dulcolax suppository x 1  now, if no bowel movement by am, consider KUB   Constipation See above  Essential hypertension increase lopressor from 12.5mg  to 25 mg twice daily and monitor bp   Anemia, iron deficiency Stable. Continue iron supplement and monitor h&h  Leg edema Continue lasix 40mg  daily as needed for swelling with kcl supplement   Insomnia Continue melatonin at bedtime.   Depression Continue celexa 20mg  daily   Goals of care: short term rehabilitation   Family/ staff Communication: reviewed  care plan with patient and nursing supervisor    Blanchie Serve, MD  [facility] [phone] (Monday-Friday 8 am - 5 pm) [phone] (afterhours)

## 2014-06-24 ENCOUNTER — Other Ambulatory Visit: Payer: Self-pay | Admitting: *Deleted

## 2014-06-24 MED ORDER — TRAMADOL HCL 50 MG PO TABS: 50.0000 mg | ORAL_TABLET | Freq: Four times a day (QID) | ORAL | Status: DC

## 2014-06-24 MED ORDER — HYDROCODONE-ACETAMINOPHEN 5-325 MG PO TABS: 1.0000 | ORAL_TABLET | Freq: Four times a day (QID) | ORAL | Status: DC | PRN

## 2014-06-25 ENCOUNTER — Non-Acute Institutional Stay (SKILLED_NURSING_FACILITY): Payer: Commercial Managed Care - HMO | Admitting: Registered Nurse

## 2014-06-25 DIAGNOSIS — R413 Other amnesia: Secondary | ICD-10-CM | POA: Diagnosis not present

## 2014-06-25 DIAGNOSIS — T814XXA Infection following a procedure, initial encounter: Secondary | ICD-10-CM | POA: Diagnosis not present

## 2014-06-25 DIAGNOSIS — I10 Essential (primary) hypertension: Secondary | ICD-10-CM

## 2014-06-25 DIAGNOSIS — R208 Other disturbances of skin sensation: Secondary | ICD-10-CM

## 2014-06-25 DIAGNOSIS — IMO0001 Reserved for inherently not codable concepts without codable children: Secondary | ICD-10-CM

## 2014-06-25 DIAGNOSIS — R200 Anesthesia of skin: Secondary | ICD-10-CM

## 2014-06-25 DIAGNOSIS — R42 Dizziness and giddiness: Secondary | ICD-10-CM | POA: Diagnosis not present

## 2014-06-25 NOTE — Progress Notes (Signed)
Patient ID: [name], female   DOB: 1955/12/24, 59 y.o.   MRN: [id number]   Place of Service: [facility]  Allergies  Allergen Reactions  . Sulfonamide Derivatives Rash    REACTION: Syncope    Code Status: Full Code  Goals of Care: Longevity/STR  Chief Complaint  Patient presents with  . Acute Visit    surgical wound infection, dizziness, hypotension, memory loss, numbness of R foot    HPI 59 y.o. female with PMH of chronic back pain, fibromyalgia, depression, insomnia, anxiety, spondylolisthesis of lumbar region s/p L4-5, L5-S1 MAS PLIF among others is being seen for an acute visit for possible surgical wound infection at the request of wound RN. Seen in room today. Patient reported feeling dizzy, especially after she got up and walked around.  Also reported having numbness around bottom of right foot-stated that she has hx of diabetes and has been receiving vit B12 injection after having gastric bypass. Per patient, BP has been low, believing that is what causing her to be dizzy. Also reported some concerns with memory loss after back surgery, stating having troubling recalling names. Denies any other concerns.   Review of Systems Constitutional: Negative for fever, chills, and fatigue. HENT: Negative for ear pain, congestion, and sore throat Eyes: Negative for eye pain, eye discharge, and visual disturbance  Cardiovascular: Negative for chest pain.  Respiratory: Negative cough, shortness of breath, and wheezing.  Gastrointestinal:  Negative for n/v, abdominal pain, diarrhea/constipation Genitourinary: Negative for  dysuria and hematuria Musculoskeletal: Positive for back pain.  Neurological: Negative for headache. Positive for dizziness Skin: Negative for rash and itchiness.   Psychiatric: Positive for depression. Negative for suicidal ideations  Past Medical History  Diagnosis Date  . Depression   . Fibromyalgia   . Dysrhythmia     "heart tends to  flutter"  . Sleep apnea     no cpap used in many yrs after weight lost-no machine now  . Heart murmur   . DVT (deep venous thrombosis) 01-17-13    past hx. -tx.5-6 yrs ago bilateral legs, occ. sporadic swelling, has IVC filter implanted  . Anxiety     takes Citalopram daily  . Anemia     takes Ferrous Sulfate daily  . Insomnia     takes Melatonin daily  . Hypertension     takes Metoprolol daily  . Complication of anesthesia yrs ago    slow to wake up  . CHF (congestive heart failure)     takes Lasix daily as needed  . Asthma     2004-prior to gastric bypass and no problems since  . Pneumonia 90's    hx of  . History of bronchitis 2012 or 2013  . Peripheral neuropathy   . Arthritis   . Joint pain   . Joint swelling   . Fracture     right foot and is in a cam boot  . Chronic back pain     spondylolisthesis/stenosis/radiculopathy  . GERD (gastroesophageal reflux disease)     hx of-no meds now  . History of colon polyps   . Urinary frequency   . Urinary urgency     Past Surgical History  Procedure Laterality Date  . Gastric by-pass  20004  . Esophagogastroduodenoscopy (egd) with propofol  03/01/2012    Procedure: ESOPHAGOGASTRODUODENOSCOPY (EGD) WITH PROPOFOL;  Surgeon: Arta Silence, MD;  Location: [facility];  Service: Endoscopy;  Laterality: N/A;  . Tonsillectomy      as child  .  Dilation and curettage of uterus  yrs ago  . Hernia repair  2005  . Cholecystectomy  2007  . Ligament repair Right 1987    Rt. knee scope  . Insertion of vena cava filter  01-17-13    inserted 2004- "abdomen"  . Esophagogastroduodenoscopy (egd) with propofol N/A 01/31/2013    Procedure: ESOPHAGOGASTRODUODENOSCOPY (EGD) WITH PROPOFOL;  Surgeon: Arta Silence, MD;  Location: [facility];  Service: Endoscopy;  Laterality: N/A;  . Balloon dilation N/A 01/31/2013    Procedure: BALLOON DILATION;  Surgeon: Arta Silence, MD;  Location: [facility];  Service: Endoscopy;  Laterality: N/A;    . Colonoscopy    . Maximum access (mas)posterior lumbar interbody fusion (plif) 2 level N/A 06/07/2014    Procedure: L4-5 L5-S1 FOR MAXIMUM ACCESS (MAS) POSTERIOR LUMBAR INTERBODY FUSION ;  Surgeon: Erline Levine, MD;  Location: [place] [facility];  Service: Neurosurgery;  Laterality: N/A;  L4-5 L5-S1 FOR MAXIMUM ACCESS (MAS) POSTERIOR LUMBAR INTERBODY FUSION     History  Substance Use Topics  . Smoking status: Former Smoker -- 0.50 packs/day for 10 years    Types: Cigarettes    Quit date: 04/19/2009  . Smokeless tobacco: Never Used  . Alcohol Use: Yes     Comment: occ. social- wine    Family History  Problem Relation Age of Onset  . Diabetes Mother   . Heart disease Father   . Hypertension Father       Medication List       This list is accurate as of: 06/25/14  4:02 PM.  Always use your most recent med list.               aspirin EC 81 MG tablet  Take 81 mg by mouth daily.     bisacodyl 10 MG suppository  Commonly known as:  DULCOLAX  Place 10 mg rectally as needed for moderate constipation or severe constipation.     BLACK COHOSH PO  Take 1 tablet by mouth daily.     CALCIUM PO  Take 1 tablet by mouth daily. 600mg      CRANBERRY PO  Take 1 tablet by mouth daily.     doxycycline 100 MG DR capsule  Commonly known as:  DORYX  Take 100 mg by mouth 2 (two) times daily.     DULoxetine 30 MG capsule  Commonly known as:  CYMBALTA  Take 30 mg by mouth daily.     ferrous sulfate 325 (65 FE) MG tablet  Take 325 mg by mouth daily.     furosemide 40 MG tablet  Commonly known as:  LASIX  Take 40 mg by mouth daily as needed for edema.     gabapentin 400 MG capsule  Commonly known as:  NEURONTIN  Take 1 capsule (400 mg total) by mouth 4 (four) times daily.     HYDROcodone-acetaminophen 5-325 MG per tablet  Commonly known as:  NORCO  Take 1 tablet by mouth every 6 (six) hours as needed for severe pain.     MELATONIN PO  Take 1 tablet by mouth at bedtime as needed. For  sleep     methocarbamol 500 MG tablet  Commonly known as:  ROBAXIN  Take 500 mg by mouth every 6 (six) hours as needed for muscle spasms.     metoCLOPramide 5 MG tablet  Commonly known as:  REGLAN  Take 5 mg by mouth every 6 (six) hours as needed for nausea.     metoprolol tartrate 25 MG tablet  Commonly known as:  LOPRESSOR  Take 12.5 mg by mouth 2 (two) times daily.     multivitamins ther. w/minerals Tabs tablet  Take 1 tablet by mouth daily.     nitroGLYCERIN 0.4 MG SL tablet  Commonly known as:  NITROSTAT  Place 0.4 mg under the tongue every 5 (five) minutes as needed for chest pain.     nystatin-triamcinolone cream  Commonly known as:  MYCOLOG II  Apply 1 application topically daily.     ondansetron 4 MG disintegrating tablet  Commonly known as:  ZOFRAN ODT  Take 1 tablet (4 mg total) by mouth every 8 (eight) hours as needed for nausea or vomiting.     pantoprazole 40 MG tablet  Commonly known as:  PROTONIX  Take 40 mg by mouth daily.     polyethylene glycol packet  Commonly known as:  MIRALAX / GLYCOLAX  Take 17 g by mouth daily.     promethazine 25 MG tablet  Commonly known as:  PHENERGAN  Take 25 mg by mouth every 6 (six) hours as needed for nausea.     saccharomyces boulardii 250 MG capsule  Commonly known as:  FLORASTOR  Take 250 mg by mouth 2 (two) times daily.     senna-docusate 8.6-50 MG per tablet  Commonly known as:  Senokot-S  Take 1 tablet by mouth at bedtime.     topiramate 100 MG tablet  Commonly known as:  TOPAMAX  Take 100 mg by mouth 2 (two) times daily.     traMADol 50 MG tablet  Commonly known as:  ULTRAM  Take 1 tablet (50 mg total) by mouth 4 (four) times daily.     vitamin C 500 MG tablet  Commonly known as:  ASCORBIC ACID  Take 500 mg by mouth daily.     VITAMIN D (CHOLECALCIFEROL) PO  Take 1,200 Units by mouth daily.        Physical Exam  BP 87/52 mmHg  Pulse 58  Temp(Src) 97 F (36.1 C)  Resp 18  Constitutional:  Obese adult female in no acute distress. Conversant and pleasant HEENT: Normocephalic and atraumatic. PERRL. EOM intact. No icterus. Oral mucosa moist. Posterior pharynx clear of any exudate or lesions.  Neck: Supple and nontender. No lymphadenopathy, masses, or thyromegaly. No JVD or carotid bruits. Cardiac: Normal S1, S2. RRR without appreciable murmurs, rubs, or gallops. Distal pulses intact. Trace dependent edema.  Lungs: No respiratory distress. Breath sounds clear bilaterally without rales, rhonchi, or wheezes. Abdomen: Bowel sounds present in all quadrants. Soft, nontender, nondistended. Musculoskeletal: able to move all extremities. Generalized weakness noted.  Skin: Warm and dry. Surgical incision on back slightly erythematous. nontender to light palpation. Has copious amount of serosanguineous drainage with foul odor. Neurological: Alert and oriented to person, place, and time.  Psychiatric: Appropriate mood and affect  Labs Reviewed  CBC Latest Ref Rng 06/07/2014 05/26/2014 05/15/2014  WBC 4.0 - 10.5 K/uL - 10.9(H) 5.2  Hemoglobin 12.0 - 15.0 g/dL 12.2 15.5(H) 11.8(L)  Hematocrit 36.0 - 46.0 % 36.0 45.3 35.6(L)  Platelets 150 - 400 K/uL - 215 196    CMP Latest Ref Rng 06/07/2014 05/26/2014 05/15/2014  Glucose 70 - 99 mg/dL 77 135(H) 92  BUN 6 - 23 mg/dL - 19 16  Creatinine 0.50 - 1.10 mg/dL - 0.91 0.98  Sodium 135 - 145 mmol/L 146(H) 140 143  Potassium 3.5 - 5.1 mmol/L 3.4(L) 2.9(L) 3.9  Chloride 96 - 112 mmol/L - 106 116(H)  CO2 19 -  32 mmol/L - 23 19  Calcium 8.4 - 10.5 mg/dL - 9.4 8.7  Total Protein 6.0 - 8.3 g/dL - 7.4 -  Total Bilirubin 0.3 - 1.2 mg/dL - 0.7 -  Alkaline Phos 39 - 117 U/L - 99 -  AST 0 - 37 U/L - 36 -  ALT 0 - 35 U/L - 34 -    No results found for: HGBA1C  Lab Results  Component Value Date   TSH 1.778 02/20/2011    Lipid Panel     Component Value Date/Time   CHOL 126 05/22/2013 0100   TRIG 37 05/22/2013 0100   TRIG 70 02/18/2006 0933   HDL 68  05/22/2013 0100   CHOLHDL 1.9 05/22/2013 0100   CHOLHDL 2.2 CALC 02/18/2006 0933   VLDL 7 05/22/2013 0100   LDLCALC 51 05/22/2013 0100   Assessment & Plan 1. Postoperative wound infection, initial encounter Doxycycline 100mg  twice daily x 10 days with florastor 250mg  twice daily x 2 weeks. F/u with neurosurg as scheduled. Continue to monitor her status for now  2. Dizziness Could be r/t low BPs. Check orthostatic BP. Encourage patient to change position slowly. Fall risk precautions. Continue to monitor for now.   3. Essential hypertension BP soft today. Will decrease lopressor from 25mg  twice daily to 12.5mg  daily. Monitor VS qshift x 3 days then daily. Continue to monitor her status.   4. Numbness of foot Most likely diabetic neuropathy. Will check A1C and B12 level. Continue gabapentin 400mg  four times daily. Continue to monitor her status.   5. Memory loss MiniCog administered with normal result. Continue to monitor for now. Reassure patient that her trouble with remembering/recalling names could be from potential effects of anesthesia. Continue to monitor for now.   Labs ordered: cbc w/ diff, bmp, a1c and b12   Family/Staff Communication Plan of care discussed with patient and nursing staff. Patient and nursing staff verbalized understanding and agree with plan of care. No additional questions or concerns reported.    Arthur Holms, MSN, AGNP-C [facility] [address] [place], [place] [address] [phone] [8am-5pm] After hours: [phone]

## 2014-06-26 LAB — BASIC METABOLIC PANEL
BUN: 21 mg/dL (ref 4–21)
Creatinine: 1.1 mg/dL (ref 0.5–1.1)
Glucose: 104 mg/dL
Potassium: 3.9 mmol/L (ref 3.4–5.3)
SODIUM: 144 mmol/L (ref 137–147)

## 2014-06-26 LAB — CBC AND DIFFERENTIAL
HCT: 28 % — AB (ref 36–46)
Hemoglobin: 8.9 g/dL — AB (ref 12.0–16.0)
Platelets: 319 K/µL (ref 150–399)
WBC: 5.8 10*3/mL

## 2014-06-26 LAB — HEMOGLOBIN A1C: HEMOGLOBIN A1C: 5.3 % (ref 4.0–6.0)

## 2014-07-02 ENCOUNTER — Telehealth: Payer: Self-pay | Admitting: Internal Medicine

## 2014-07-02 NOTE — Telephone Encounter (Signed)
Pt request to speak to someone that can help explain or tell her what to do about this [employer] referral. Pt was [name] pt and he send her to get back surgery or back doctor. She had her surgery and now she need to go back for a follow up but [employer] need a referral before she can be seen. Pt schedule as a new pt with [name] in June but she need advise on what to do about. Please call her

## 2014-07-03 ENCOUNTER — Non-Acute Institutional Stay (SKILLED_NURSING_FACILITY): Payer: Commercial Managed Care - HMO | Admitting: Registered Nurse

## 2014-07-03 DIAGNOSIS — K5901 Slow transit constipation: Secondary | ICD-10-CM

## 2014-07-03 DIAGNOSIS — R600 Localized edema: Secondary | ICD-10-CM

## 2014-07-03 DIAGNOSIS — T814XXD Infection following a procedure, subsequent encounter: Secondary | ICD-10-CM

## 2014-07-03 DIAGNOSIS — I10 Essential (primary) hypertension: Secondary | ICD-10-CM | POA: Diagnosis not present

## 2014-07-03 DIAGNOSIS — M4316 Spondylolisthesis, lumbar region: Secondary | ICD-10-CM

## 2014-07-03 DIAGNOSIS — G4700 Insomnia, unspecified: Secondary | ICD-10-CM | POA: Diagnosis not present

## 2014-07-03 DIAGNOSIS — F329 Major depressive disorder, single episode, unspecified: Secondary | ICD-10-CM

## 2014-07-03 DIAGNOSIS — R208 Other disturbances of skin sensation: Secondary | ICD-10-CM

## 2014-07-03 DIAGNOSIS — R200 Anesthesia of skin: Secondary | ICD-10-CM

## 2014-07-03 DIAGNOSIS — D509 Iron deficiency anemia, unspecified: Secondary | ICD-10-CM

## 2014-07-03 DIAGNOSIS — IMO0001 Reserved for inherently not codable concepts without codable children: Secondary | ICD-10-CM

## 2014-07-03 DIAGNOSIS — F32A Depression, unspecified: Secondary | ICD-10-CM

## 2014-07-04 ENCOUNTER — Encounter: Payer: Self-pay | Admitting: Registered Nurse

## 2014-07-04 LAB — CBC AND DIFFERENTIAL
HCT: 32 % — AB (ref 36–46)
Hemoglobin: 10.3 g/dL — AB (ref 12.0–16.0)
Platelets: 233 10*3/uL (ref 150–399)
WBC: 6.7 10^3/mL

## 2014-07-04 NOTE — Progress Notes (Signed)
Patient ID: [name], female   DOB: 11/24/1955, 59 y.o.   MRN: [id number]   Place of Service: [facility]  Allergies  Allergen Reactions  . Sulfonamide Derivatives Rash    REACTION: Syncope    Code Status: Full Code  Goals of Care: Longevity/STR  Chief Complaint  Patient presents with  . Discharge Note    HPI 59 y.o. female with PMH of chronic back pain, fibromyalgia, depression, insomnia, anxiety among others is being seen for a discharge visit. She was here for short term rehabilitation post hospital admission from 06/07/14 to 06/11/14 with spondylolisthesis of lumbar region. She underwent L4-5, L5-S1 MAS PLIF. She has worked well with therapy team is ready to be discharged home with [facility] PT/OT/RN and DME (FWW, 3-1, Hip kit). Seen in room today. Reported having lower back pain, especially if she tries to move. Current pain management is adequate for pain relief. Also reported that her mood has improved since I started her on cymbalta, but would like to increase her dose. Still has numbness in her right foot. Denies any other concerns.    Review of Systems Constitutional: Negative for fever, chills, and fatigue. HENT: Negative for ear pain, congestion, and sore throat Eyes: Negative for eye pain, eye discharge, and visual disturbance  Cardiovascular: Negative for chest pain. Positive for leg swelling Respiratory: Negative cough, shortness of breath, and wheezing.  Gastrointestinal: Negative for nausea and vomiting. Positive for constipation Genitourinary: Negative for  dysuria and hematuria Musculoskeletal: Positive for back pain and spasms and bil leg spasms  Neurological: Negative for dizziness and headache. Positive of numbness is right foot Skin: Negative for rash and itchiness.   Psychiatric: Negative for depression  Past Medical History  Diagnosis Date  . Depression   . Fibromyalgia   . Dysrhythmia     "heart tends to flutter"  . Sleep apnea     no  cpap used in many yrs after weight lost-no machine now  . Heart murmur   . DVT (deep venous thrombosis) 01-17-13    past hx. -tx.5-6 yrs ago bilateral legs, occ. sporadic swelling, has IVC filter implanted  . Anxiety     takes Citalopram daily  . Anemia     takes Ferrous Sulfate daily  . Insomnia     takes Melatonin daily  . Hypertension     takes Metoprolol daily  . Complication of anesthesia yrs ago    slow to wake up  . CHF (congestive heart failure)     takes Lasix daily as needed  . Asthma     2004-prior to gastric bypass and no problems since  . Pneumonia 90's    hx of  . History of bronchitis 2012 or 2013  . Peripheral neuropathy   . Arthritis   . Joint pain   . Joint swelling   . Fracture     right foot and is in a cam boot  . Chronic back pain     spondylolisthesis/stenosis/radiculopathy  . GERD (gastroesophageal reflux disease)     hx of-no meds now  . History of colon polyps   . Urinary frequency   . Urinary urgency     Past Surgical History  Procedure Laterality Date  . Gastric by-pass  20004  . Esophagogastroduodenoscopy (egd) with propofol  03/01/2012    Procedure: ESOPHAGOGASTRODUODENOSCOPY (EGD) WITH PROPOFOL;  Surgeon: Arta Silence, MD;  Location: [facility];  Service: Endoscopy;  Laterality: N/A;  . Tonsillectomy  as child  . Dilation and curettage of uterus  yrs ago  . Hernia repair  2005  . Cholecystectomy  2007  . Ligament repair Right 1987    Rt. knee scope  . Insertion of vena cava filter  01-17-13    inserted 2004- "abdomen"  . Esophagogastroduodenoscopy (egd) with propofol N/A 01/31/2013    Procedure: ESOPHAGOGASTRODUODENOSCOPY (EGD) WITH PROPOFOL;  Surgeon: Arta Silence, MD;  Location: [facility];  Service: Endoscopy;  Laterality: N/A;  . Balloon dilation N/A 01/31/2013    Procedure: BALLOON DILATION;  Surgeon: Arta Silence, MD;  Location: [facility];  Service: Endoscopy;  Laterality: N/A;  . Colonoscopy    . Maximum  access (mas)posterior lumbar interbody fusion (plif) 2 level N/A 06/07/2014    Procedure: L4-5 L5-S1 FOR MAXIMUM ACCESS (MAS) POSTERIOR LUMBAR INTERBODY FUSION ;  Surgeon: Erline Levine, MD;  Location: [place] [facility];  Service: Neurosurgery;  Laterality: N/A;  L4-5 L5-S1 FOR MAXIMUM ACCESS (MAS) POSTERIOR LUMBAR INTERBODY FUSION     History  Substance Use Topics  . Smoking status: Former Smoker -- 0.50 packs/day for 10 years    Types: Cigarettes    Quit date: 04/19/2009  . Smokeless tobacco: Never Used  . Alcohol Use: Yes     Comment: occ. social- wine    Family History  Problem Relation Age of Onset  . Diabetes Mother   . Heart disease Father   . Hypertension Father       Medication List       This list is accurate as of: 07/03/14 11:59 PM.  Always use your most recent med list.               aspirin EC 81 MG tablet  Take 81 mg by mouth daily.     bisacodyl 10 MG suppository  Commonly known as:  DULCOLAX  Place 10 mg rectally as needed for moderate constipation or severe constipation.     BLACK COHOSH PO  Take 1 tablet by mouth daily.     CALCIUM PO  Take 1 tablet by mouth daily. 639m     collagenase ointment  Commonly known as:  SANTYL  Apply 1 application topically daily.     CRANBERRY PO  Take 1 tablet by mouth daily.     doxycycline 100 MG DR capsule  Commonly known as:  DORYX  Take 100 mg by mouth 2 (two) times daily.     DULoxetine 60 MG capsule  Commonly known as:  CYMBALTA  Take 60 mg by mouth daily.     ferrous sulfate 325 (65 FE) MG tablet  Take 325 mg by mouth daily.     furosemide 40 MG tablet  Commonly known as:  LASIX  Take 40 mg by mouth daily as needed for edema.     gabapentin 400 MG capsule  Commonly known as:  NEURONTIN  Take 1 capsule (400 mg total) by mouth 4 (four) times daily.     HYDROcodone-acetaminophen 5-325 MG per tablet  Commonly known as:  NORCO  Take 1 tablet by mouth every 6 (six) hours as needed for severe pain.       MELATONIN PO  Take 1 tablet by mouth at bedtime as needed. For sleep     methocarbamol 500 MG tablet  Commonly known as:  ROBAXIN  Take 500 mg by mouth every 6 (six) hours as needed for muscle spasms.     metoCLOPramide 5 MG tablet  Commonly known as:  REGLAN  Take  5 mg by mouth every 6 (six) hours as needed for nausea.     metoprolol tartrate 25 MG tablet  Commonly known as:  LOPRESSOR  Take 12.5 mg by mouth 2 (two) times daily.     multivitamins ther. w/minerals Tabs tablet  Take 1 tablet by mouth daily.     nitroGLYCERIN 0.4 MG SL tablet  Commonly known as:  NITROSTAT  Place 0.4 mg under the tongue every 5 (five) minutes as needed for chest pain.     pantoprazole 40 MG tablet  Commonly known as:  PROTONIX  Take 40 mg by mouth daily.     polyethylene glycol packet  Commonly known as:  MIRALAX / GLYCOLAX  Take 17 g by mouth daily.     potassium chloride SA 20 MEQ tablet  Commonly known as:  K-DUR,KLOR-CON  Take 20 mEq by mouth daily.     promethazine 25 MG tablet  Commonly known as:  PHENERGAN  Take 25 mg by mouth every 6 (six) hours as needed for nausea.     saccharomyces boulardii 250 MG capsule  Commonly known as:  FLORASTOR  Take 250 mg by mouth 2 (two) times daily.     senna-docusate 8.6-50 MG per tablet  Commonly known as:  Senokot-S  Take 1 tablet by mouth at bedtime.     topiramate 100 MG tablet  Commonly known as:  TOPAMAX  Take 100 mg by mouth 2 (two) times daily.     traMADol 50 MG tablet  Commonly known as:  ULTRAM  Take 1 tablet (50 mg total) by mouth 4 (four) times daily.     vitamin C 500 MG tablet  Commonly known as:  ASCORBIC ACID  Take 500 mg by mouth daily.     VITAMIN D (CHOLECALCIFEROL) PO  Take 1,200 Units by mouth daily.        Physical Exam  BP 108/60 mmHg  Pulse 56  Temp(Src) 98 F (36.7 C)  Resp 18  Ht _0  (1.651 m)  Wt 218 lb 9.6 oz (99.156 kg)  BMI 36.38 kg/m2  Constitutional: Obese adult female in no acute  distress. Conversant and pleasant HEENT: Normocephalic and atraumatic. PERRL. EOM intact. No icterus. Oral mucosa moist. Posterior pharynx clear of any exudate or lesions.  Neck: Supple and nontender. No lymphadenopathy, masses, or thyromegaly. No JVD or carotid bruits. Cardiac: Normal S1, S2. RRR without appreciable murmurs, rubs, or gallops. Distal pulses intact. Trace dependent edema.  Lungs: No respiratory distress. Breath sounds clear bilaterally without rales, rhonchi, or wheezes. Abdomen: Bowel sounds present in all quadrants. Soft, nontender, nondistended.  Musculoskeletal: able to move all extremities. Generalized weakness noted.  Skin: Warm and dry. Surgical incision on back has a few small open areas covered with yellow eschar. Has small-amount of serosanguineous drainage. Dressing is  Neurological: Alert and oriented to person, place, and time.  Psychiatric: Appropriate mood and affect    Labs Reviewed  CBC Latest Ref Rng 07/04/2014 06/26/2014 06/07/2014  WBC - 6.7 5.8 -  Hemoglobin 12.0 - 16.0 g/dL 10.3(A) 8.9(A) 12.2  Hematocrit 36 - 46 % 32(A) 28(A) 36.0  Platelets 150 - 399 K/L 233 319 -    CMP Latest Ref Rng 06/26/2014 06/07/2014 05/26/2014  Glucose 70 - 99 mg/dL - 77 135(H)  BUN 4 - 21 mg/dL 21 - 19  Creatinine 0.5 - 1.1 mg/dL 1.1 - 0.91  Sodium 137 - 147 mmol/L 144 146(H) 140  Potassium 3.4 - 5.3 mmol/L 3.9 3.4(L) 2.9(L)  Chloride 96 - 112 mmol/L - - 106  CO2 19 - 32 mmol/L - - 23  Calcium 8.4 - 10.5 mg/dL - - 9.4  Total Protein 6.0 - 8.3 g/dL - - 7.4  Total Bilirubin 0.3 - 1.2 mg/dL - - 0.7  Alkaline Phos 39 - 117 U/L - - 99  AST 0 - 37 U/L - - 36  ALT 0 - 35 U/L - - 34    Lab Results  Component Value Date   HGBA1C 5.3 06/26/2014    Lab Results  Component Value Date   TSH 1.778 02/20/2011    Lipid Panel     Component Value Date/Time   CHOL 126 05/22/2013 0100   TRIG 37 05/22/2013 0100   TRIG 70 02/18/2006 0933   HDL 68 05/22/2013 0100   CHOLHDL 1.9  05/22/2013 0100   CHOLHDL 2.2 CALC 02/18/2006 0933   VLDL 7 05/22/2013 0100   LDLCALC 51 05/22/2013 0100   Assessment & Plan 1. Essential hypertension Stable. Continue lopressor 12.30m twice daily. Continue to f/u with pcp  2. Spondylolisthesis of lumbar region S/p L4-5, L5-S1 MAS PLIF. [facility] gait/strength/balance training to restore/maximize function. Continue tramadol 564mfour times daily, topamax 10049mwice daily, and norco 5/325m43mery six hours as needed for pain with robaxin 500mg59mry six hours as needed for muscle spasms. Continue to f/u with neurosurgery as scheduled.   3. Bilateral leg edema Continue lasix 40mg 46my as needed for swelling and potassium 20mEq 69my   4. Insomnia Continue melatonin as needed at bedtime.   5. Slow transit constipation Stable. Continue senna 8.6/50mg 2 tabs daily with miralax 17g daily as needed for constipation. Encourage hydration and ambulation as tolerated. Continue to monitor.   6. Anemia, iron deficiency Hgb decreased from 12.2 preop on 06/07/14 to 8.9 on 06/26/14. Hgb increased to 10.3 on 07/04/14. Hemoccult positive for blood. Most likely a combination of postop, iron deficiency, and gi bleed.  VS stable. Encourage patient to f/u with her PCP and established GI doctor as soon as possible after discharge.   7. Depression Improves. Will increase cymbalta to 60mg da54m Continue to f/u with pcp  8. Wound infection, subsequent encounter Improves. Continue and complete doxycyline and probiotic. Last dose on 07/05/14. [facility]. Continue daily dressing change to surgical incision with santyl, dry gauze, and bordered foam dressing daily. Continue to f/u with neurosurgery as scheduled.   9. Numbness in foot Persists. Most likely r/t to diabetes. Continue topamax 100mg [id number] for neuropathic pain. Recommended foot wear with good support for protection from injuries.  Continue to f/u with pcp  Home health services: HH PT/OT/[facility] DME required: FWW, 3-1, Hip Kit PCP follow-up: Please schedule f/u appt with your PCP w/in 1-2 weeks after discharge from SNF.  30-day supply of prescription medications provided (#60 tramadol 50mg, #3022mco 5/325mg, #60 79mxin 500mg)   Fam96mStaff Communication Plan of care discussed with patient and nursing staff. Patient and nursing staff verbalized understanding and agree with plan of care. No additional questions or concerns reported.    Kelicia Youtz, Arthur HolmsC [facility] CenterS[address] [place]er[place]336)-[phone] hours: [phone]

## 2014-07-04 NOTE — Telephone Encounter (Signed)
Attempted to call pt. Phone is not accepting calls.

## 2014-07-04 NOTE — Telephone Encounter (Signed)
[employer] Josem Kaufmann #[id number] valid 07/04/14-12/31/14 for 6 visits

## 2014-07-06 DIAGNOSIS — I2510 Atherosclerotic heart disease of native coronary artery without angina pectoris: Secondary | ICD-10-CM | POA: Diagnosis not present

## 2014-07-06 DIAGNOSIS — Z4789 Encounter for other orthopedic aftercare: Secondary | ICD-10-CM | POA: Diagnosis not present

## 2014-07-06 DIAGNOSIS — M797 Fibromyalgia: Secondary | ICD-10-CM | POA: Diagnosis not present

## 2014-07-07 DIAGNOSIS — M797 Fibromyalgia: Secondary | ICD-10-CM | POA: Diagnosis not present

## 2014-07-07 DIAGNOSIS — Z4789 Encounter for other orthopedic aftercare: Secondary | ICD-10-CM | POA: Diagnosis not present

## 2014-07-07 DIAGNOSIS — I2510 Atherosclerotic heart disease of native coronary artery without angina pectoris: Secondary | ICD-10-CM | POA: Diagnosis not present

## 2014-07-09 DIAGNOSIS — I2510 Atherosclerotic heart disease of native coronary artery without angina pectoris: Secondary | ICD-10-CM | POA: Diagnosis not present

## 2014-07-09 DIAGNOSIS — M797 Fibromyalgia: Secondary | ICD-10-CM | POA: Diagnosis not present

## 2014-07-09 DIAGNOSIS — Z4789 Encounter for other orthopedic aftercare: Secondary | ICD-10-CM | POA: Diagnosis not present

## 2014-07-10 DIAGNOSIS — R6889 Other general symptoms and signs: Secondary | ICD-10-CM | POA: Diagnosis not present

## 2014-07-11 DIAGNOSIS — Z4789 Encounter for other orthopedic aftercare: Secondary | ICD-10-CM | POA: Diagnosis not present

## 2014-07-11 DIAGNOSIS — M797 Fibromyalgia: Secondary | ICD-10-CM | POA: Diagnosis not present

## 2014-07-11 DIAGNOSIS — I2510 Atherosclerotic heart disease of native coronary artery without angina pectoris: Secondary | ICD-10-CM | POA: Diagnosis not present

## 2014-07-12 DIAGNOSIS — M797 Fibromyalgia: Secondary | ICD-10-CM | POA: Diagnosis not present

## 2014-07-12 DIAGNOSIS — I2510 Atherosclerotic heart disease of native coronary artery without angina pectoris: Secondary | ICD-10-CM | POA: Diagnosis not present

## 2014-07-12 DIAGNOSIS — Z4789 Encounter for other orthopedic aftercare: Secondary | ICD-10-CM | POA: Diagnosis not present

## 2014-07-16 DIAGNOSIS — I2510 Atherosclerotic heart disease of native coronary artery without angina pectoris: Secondary | ICD-10-CM | POA: Diagnosis not present

## 2014-07-16 DIAGNOSIS — M797 Fibromyalgia: Secondary | ICD-10-CM | POA: Diagnosis not present

## 2014-07-16 DIAGNOSIS — Z4789 Encounter for other orthopedic aftercare: Secondary | ICD-10-CM | POA: Diagnosis not present

## 2014-07-17 DIAGNOSIS — Z4789 Encounter for other orthopedic aftercare: Secondary | ICD-10-CM | POA: Diagnosis not present

## 2014-07-17 DIAGNOSIS — I2510 Atherosclerotic heart disease of native coronary artery without angina pectoris: Secondary | ICD-10-CM | POA: Diagnosis not present

## 2014-07-17 DIAGNOSIS — M797 Fibromyalgia: Secondary | ICD-10-CM | POA: Diagnosis not present

## 2014-07-18 DIAGNOSIS — I2510 Atherosclerotic heart disease of native coronary artery without angina pectoris: Secondary | ICD-10-CM | POA: Diagnosis not present

## 2014-07-18 DIAGNOSIS — Z4789 Encounter for other orthopedic aftercare: Secondary | ICD-10-CM | POA: Diagnosis not present

## 2014-07-18 DIAGNOSIS — M797 Fibromyalgia: Secondary | ICD-10-CM | POA: Diagnosis not present

## 2014-07-19 DIAGNOSIS — Z4789 Encounter for other orthopedic aftercare: Secondary | ICD-10-CM | POA: Diagnosis not present

## 2014-07-19 DIAGNOSIS — M797 Fibromyalgia: Secondary | ICD-10-CM | POA: Diagnosis not present

## 2014-07-19 DIAGNOSIS — I2510 Atherosclerotic heart disease of native coronary artery without angina pectoris: Secondary | ICD-10-CM | POA: Diagnosis not present

## 2014-07-22 DIAGNOSIS — I2510 Atherosclerotic heart disease of native coronary artery without angina pectoris: Secondary | ICD-10-CM | POA: Diagnosis not present

## 2014-07-22 DIAGNOSIS — M797 Fibromyalgia: Secondary | ICD-10-CM | POA: Diagnosis not present

## 2014-07-22 DIAGNOSIS — Z4789 Encounter for other orthopedic aftercare: Secondary | ICD-10-CM | POA: Diagnosis not present

## 2014-07-23 ENCOUNTER — Ambulatory Visit: Payer: Medicare Other | Admitting: Internal Medicine

## 2014-07-23 DIAGNOSIS — I2510 Atherosclerotic heart disease of native coronary artery without angina pectoris: Secondary | ICD-10-CM | POA: Diagnosis not present

## 2014-07-23 DIAGNOSIS — M797 Fibromyalgia: Secondary | ICD-10-CM | POA: Diagnosis not present

## 2014-07-23 DIAGNOSIS — Z4789 Encounter for other orthopedic aftercare: Secondary | ICD-10-CM | POA: Diagnosis not present

## 2014-07-23 DIAGNOSIS — Z0289 Encounter for other administrative examinations: Secondary | ICD-10-CM

## 2014-07-24 DIAGNOSIS — M797 Fibromyalgia: Secondary | ICD-10-CM | POA: Diagnosis not present

## 2014-07-24 DIAGNOSIS — M4806 Spinal stenosis, lumbar region: Secondary | ICD-10-CM | POA: Diagnosis not present

## 2014-07-24 DIAGNOSIS — Z6835 Body mass index (BMI) 35.0-35.9, adult: Secondary | ICD-10-CM | POA: Diagnosis not present

## 2014-07-24 DIAGNOSIS — M5416 Radiculopathy, lumbar region: Secondary | ICD-10-CM | POA: Diagnosis not present

## 2014-07-24 DIAGNOSIS — Z4789 Encounter for other orthopedic aftercare: Secondary | ICD-10-CM | POA: Diagnosis not present

## 2014-07-24 DIAGNOSIS — M545 Low back pain: Secondary | ICD-10-CM | POA: Diagnosis not present

## 2014-07-24 DIAGNOSIS — I2510 Atherosclerotic heart disease of native coronary artery without angina pectoris: Secondary | ICD-10-CM | POA: Diagnosis not present

## 2014-07-24 DIAGNOSIS — M4316 Spondylolisthesis, lumbar region: Secondary | ICD-10-CM | POA: Diagnosis not present

## 2014-07-25 DIAGNOSIS — Z4789 Encounter for other orthopedic aftercare: Secondary | ICD-10-CM | POA: Diagnosis not present

## 2014-07-25 DIAGNOSIS — M797 Fibromyalgia: Secondary | ICD-10-CM | POA: Diagnosis not present

## 2014-07-25 DIAGNOSIS — I2510 Atherosclerotic heart disease of native coronary artery without angina pectoris: Secondary | ICD-10-CM | POA: Diagnosis not present

## 2014-07-26 DIAGNOSIS — M797 Fibromyalgia: Secondary | ICD-10-CM | POA: Diagnosis not present

## 2014-07-26 DIAGNOSIS — Z4789 Encounter for other orthopedic aftercare: Secondary | ICD-10-CM | POA: Diagnosis not present

## 2014-07-26 DIAGNOSIS — I2510 Atherosclerotic heart disease of native coronary artery without angina pectoris: Secondary | ICD-10-CM | POA: Diagnosis not present

## 2014-07-29 DIAGNOSIS — I2510 Atherosclerotic heart disease of native coronary artery without angina pectoris: Secondary | ICD-10-CM | POA: Diagnosis not present

## 2014-07-29 DIAGNOSIS — Z4789 Encounter for other orthopedic aftercare: Secondary | ICD-10-CM | POA: Diagnosis not present

## 2014-07-29 DIAGNOSIS — M797 Fibromyalgia: Secondary | ICD-10-CM | POA: Diagnosis not present

## 2014-07-31 DIAGNOSIS — Z4789 Encounter for other orthopedic aftercare: Secondary | ICD-10-CM | POA: Diagnosis not present

## 2014-07-31 DIAGNOSIS — I2510 Atherosclerotic heart disease of native coronary artery without angina pectoris: Secondary | ICD-10-CM | POA: Diagnosis not present

## 2014-07-31 DIAGNOSIS — M797 Fibromyalgia: Secondary | ICD-10-CM | POA: Diagnosis not present

## 2014-08-06 DIAGNOSIS — Z4789 Encounter for other orthopedic aftercare: Secondary | ICD-10-CM | POA: Diagnosis not present

## 2014-08-06 DIAGNOSIS — M797 Fibromyalgia: Secondary | ICD-10-CM | POA: Diagnosis not present

## 2014-08-06 DIAGNOSIS — I2510 Atherosclerotic heart disease of native coronary artery without angina pectoris: Secondary | ICD-10-CM | POA: Diagnosis not present

## 2014-08-07 ENCOUNTER — Other Ambulatory Visit (INDEPENDENT_AMBULATORY_CARE_PROVIDER_SITE_OTHER): Payer: Commercial Managed Care - HMO

## 2014-08-07 ENCOUNTER — Ambulatory Visit (INDEPENDENT_AMBULATORY_CARE_PROVIDER_SITE_OTHER): Payer: Commercial Managed Care - HMO | Admitting: Internal Medicine

## 2014-08-07 ENCOUNTER — Encounter: Payer: Self-pay | Admitting: Internal Medicine

## 2014-08-07 VITALS — BP 136/82 | HR 69 | Temp 98.1°F | Resp 18 | Ht 67.5 in | Wt 225.0 lb

## 2014-08-07 DIAGNOSIS — Z8639 Personal history of other endocrine, nutritional and metabolic disease: Secondary | ICD-10-CM

## 2014-08-07 DIAGNOSIS — J45909 Unspecified asthma, uncomplicated: Secondary | ICD-10-CM | POA: Diagnosis not present

## 2014-08-07 DIAGNOSIS — Z9884 Bariatric surgery status: Secondary | ICD-10-CM | POA: Diagnosis not present

## 2014-08-07 DIAGNOSIS — Z9889 Other specified postprocedural states: Secondary | ICD-10-CM

## 2014-08-07 DIAGNOSIS — Z23 Encounter for immunization: Secondary | ICD-10-CM | POA: Diagnosis not present

## 2014-08-07 DIAGNOSIS — I10 Essential (primary) hypertension: Secondary | ICD-10-CM

## 2014-08-07 DIAGNOSIS — M797 Fibromyalgia: Secondary | ICD-10-CM | POA: Diagnosis not present

## 2014-08-07 DIAGNOSIS — R8290 Unspecified abnormal findings in urine: Secondary | ICD-10-CM

## 2014-08-07 DIAGNOSIS — D62 Acute posthemorrhagic anemia: Secondary | ICD-10-CM

## 2014-08-07 DIAGNOSIS — R112 Nausea with vomiting, unspecified: Secondary | ICD-10-CM

## 2014-08-07 LAB — CBC
HEMATOCRIT: 35.8 % — AB (ref 36.0–46.0)
Hemoglobin: 11.7 g/dL — ABNORMAL LOW (ref 12.0–15.0)
MCHC: 32.7 g/dL (ref 30.0–36.0)
MCV: 85.6 fl (ref 78.0–100.0)
PLATELETS: 249 10*3/uL (ref 150.0–400.0)
RBC: 4.18 Mil/uL (ref 3.87–5.11)
RDW: 16.7 % — AB (ref 11.5–15.5)
WBC: 5.9 10*3/uL (ref 4.0–10.5)

## 2014-08-07 LAB — VITAMIN B12: Vitamin B-12: >1500 pg/mL — ABNORMAL HIGH (ref 211–911)

## 2014-08-07 LAB — COMPREHENSIVE METABOLIC PANEL
ALBUMIN: 3.5 g/dL (ref 3.5–5.2)
ALK PHOS: 99 U/L (ref 39–117)
ALT: 18 U/L (ref 0–35)
AST: 18 U/L (ref 0–37)
BUN: 18 mg/dL (ref 6–23)
CALCIUM: 8.8 mg/dL (ref 8.4–10.5)
CHLORIDE: 114 meq/L — AB (ref 96–112)
CO2: 21 mEq/L (ref 19–32)
Creatinine, Ser: 0.82 mg/dL (ref 0.40–1.20)
GFR: 91.2 mL/min (ref >60.00)
Glucose, Bld: 76 mg/dL (ref 70–99)
Potassium: 3.7 mEq/L (ref 3.5–5.1)
Sodium: 140 mEq/L (ref 135–145)
Total Bilirubin: 0.2 mg/dL (ref 0.2–1.2)
Total Protein: 6.6 g/dL (ref 6.0–8.3)

## 2014-08-07 LAB — POCT URINALYSIS DIPSTICK
BILIRUBIN UA: NEGATIVE
GLUCOSE UA: NEGATIVE
Ketones, UA: NEGATIVE
Nitrite, UA: NEGATIVE
PH UA: 6
Protein, UA: 0.15
RBC UA: NEGATIVE
Urobilinogen, UA: 4

## 2014-08-07 LAB — FOLATE: Folate: 8.1 ng/mL (ref >5.9)

## 2014-08-07 LAB — FERRITIN: Ferritin: 28.5 ng/mL (ref 10.0–291.0)

## 2014-08-07 MED ORDER — FOSFOMYCIN TROMETHAMINE 3 G PO PACK: 3.0000 g | PACK | Freq: Once | ORAL | Status: DC

## 2014-08-07 NOTE — Progress Notes (Signed)
Pre visit review using our clinic review tool, if applicable. No additional management support is needed unless otherwise documented below in the visit note. 

## 2014-08-07 NOTE — Patient Instructions (Addendum)
We have checked the urine today and you do have signs of a mild  Infection. We will send in a medicine that it one dose and lasts a long time. You mix it with water and drink it and it clears the infection over days.   We are also checking on the blood work today to make sure that you do not have any vitamin levels that are low and causing you problems. We will also check on the blood counts and the kidneys and liver.   Health Maintenance Adopting a healthy lifestyle and getting preventive care can go a long way to promote health and wellness. Talk with your health care provider about what schedule of regular examinations is right for you. This is a good chance for you to check in with your provider about disease prevention and staying healthy. In between checkups, there are plenty of things you can do on your own. Experts have done a lot of research about which lifestyle changes and preventive measures are most likely to keep you healthy. Ask your health care provider for more information. WEIGHT AND DIET  Eat a healthy diet  Be sure to include plenty of vegetables, fruits, low-fat dairy products, and lean protein.  Do not eat a lot of foods high in solid fats, added sugars, or salt.  Get regular exercise. This is one of the most important things you can do for your health.  Most adults should exercise for at least 150 minutes each week. The exercise should increase your heart rate and make you sweat (moderate-intensity exercise).  Most adults should also do strengthening exercises at least twice a week. This is in addition to the moderate-intensity exercise.  Maintain a healthy weight  Body mass index (BMI) is a measurement that can be used to identify possible weight problems. It estimates body fat based on height and weight. Your health care provider can help determine your BMI and help you achieve or maintain a healthy weight.  For females 18 years of age and older:   A BMI below 18.5  is considered underweight.  A BMI of 18.5 to 24.9 is normal.  A BMI of 25 to 29.9 is considered overweight.  A BMI of 30 and above is considered obese.  Watch levels of cholesterol and blood lipids  You should start having your blood tested for lipids and cholesterol at 61 years of age, then have this test every 5 years.  You may need to have your cholesterol levels checked more often if:  Your lipid or cholesterol levels are high.  You are older than 61 years of age.  You are at high risk for heart disease.  CANCER SCREENING   Lung Cancer  Lung cancer screening is recommended for adults 37-6 years old who are at high risk for lung cancer because of a history of smoking.  A yearly low-dose CT scan of the lungs is recommended for people who:  Currently smoke.  Have quit within the past 15 years.  Have at least a 30-pack-year history of smoking. A pack year is smoking an average of one pack of cigarettes a day for 1 year.  Yearly screening should continue until it has been 15 years since you quit.  Yearly screening should stop if you develop a health problem that would prevent you from having lung cancer treatment.  Breast Cancer  Practice breast self-awareness. This means understanding how your breasts normally appear and feel.  It also means doing regular breast  self-exams. Let your health care provider know about any changes, no matter how small.  If you are in your 20s or 30s, you should have a clinical breast exam (CBE) by a health care provider every 1-3 years as part of a regular health exam.  If you are 67 or older, have a CBE every year. Also consider having a breast X-ray (mammogram) every year.  If you have a family history of breast cancer, talk to your health care provider about genetic screening.  If you are at high risk for breast cancer, talk to your health care provider about having an MRI and a mammogram every year.  Breast cancer gene (BRCA)  assessment is recommended for women who have family members with BRCA-related cancers. BRCA-related cancers include:  Breast.  Ovarian.  Tubal.  Peritoneal cancers.  Results of the assessment will determine the need for genetic counseling and BRCA1 and BRCA2 testing. Cervical Cancer Routine pelvic examinations to screen for cervical cancer are no longer recommended for nonpregnant women who are considered low risk for cancer of the pelvic organs (ovaries, uterus, and vagina) and who do not have symptoms. A pelvic examination may be necessary if you have symptoms including those associated with pelvic infections. Ask your health care provider if a screening pelvic exam is right for you.   The Pap test is the screening test for cervical cancer for women who are considered at risk.  If you had a hysterectomy for a problem that was not cancer or a condition that could lead to cancer, then you no longer need Pap tests.  If you are older than 65 years, and you have had normal Pap tests for the past 10 years, you no longer need to have Pap tests.  If you have had past treatment for cervical cancer or a condition that could lead to cancer, you need Pap tests and screening for cancer for at least 20 years after your treatment.  If you no longer get a Pap test, assess your risk factors if they change (such as having a new sexual partner). This can affect whether you should start being screened again.  Some women have medical problems that increase their chance of getting cervical cancer. If this is the case for you, your health care provider may recommend more frequent screening and Pap tests.  The human papillomavirus (HPV) test is another test that may be used for cervical cancer screening. The HPV test looks for the virus that can cause cell changes in the cervix. The cells collected during the Pap test can be tested for HPV.  The HPV test can be used to screen women 107 years of age and older.  Getting tested for HPV can extend the interval between normal Pap tests from three to five years.  An HPV test also should be used to screen women of any age who have unclear Pap test results.  After 61 years of age, women should have HPV testing as often as Pap tests.  Colorectal Cancer  This type of cancer can be detected and often prevented.  Routine colorectal cancer screening usually begins at 61 years of age and continues through 61 years of age.  Your health care provider may recommend screening at an earlier age if you have risk factors for colon cancer.  Your health care provider may also recommend using home test kits to check for hidden blood in the stool.  A small camera at the end of a tube can be  used to examine your colon directly (sigmoidoscopy or colonoscopy). This is done to check for the earliest forms of colorectal cancer.  Routine screening usually begins at age 30.  Direct examination of the colon should be repeated every 5-10 years through 61 years of age. However, you may need to be screened more often if early forms of precancerous polyps or small growths are found. Skin Cancer  Check your skin from head to toe regularly.  Tell your health care provider about any new moles or changes in moles, especially if there is a change in a mole's shape or color.  Also tell your health care provider if you have a mole that is larger than the size of a pencil eraser.  Always use sunscreen. Apply sunscreen liberally and repeatedly throughout the day.  Protect yourself by wearing long sleeves, pants, a wide-brimmed hat, and sunglasses whenever you are outside. HEART DISEASE, DIABETES, AND HIGH BLOOD PRESSURE   Have your blood pressure checked at least every 1-2 years. High blood pressure causes heart disease and increases the risk of stroke.  If you are between 59 years and 7 years old, ask your health care provider if you should take aspirin to prevent  strokes.  Have regular diabetes screenings. This involves taking a blood sample to check your fasting blood sugar level.  If you are at a normal weight and have a low risk for diabetes, have this test once every three years after 61 years of age.  If you are overweight and have a high risk for diabetes, consider being tested at a younger age or more often. PREVENTING INFECTION  Hepatitis B  If you have a higher risk for hepatitis B, you should be screened for this virus. You are considered at high risk for hepatitis B if:  You were born in a country where hepatitis B is common. Ask your health care provider which countries are considered high risk.  Your parents were born in a high-risk country, and you have not been immunized against hepatitis B (hepatitis B vaccine).  You have HIV or AIDS.  You use needles to inject street drugs.  You live with someone who has hepatitis B.  You have had sex with someone who has hepatitis B.  You get hemodialysis treatment.  You take certain medicines for conditions, including cancer, organ transplantation, and autoimmune conditions. Hepatitis C  Blood testing is recommended for:  Everyone born from [birth year] through 1967.  Anyone with known risk factors for hepatitis C. Sexually transmitted infections (STIs)  You should be screened for sexually transmitted infections (STIs) including gonorrhea and chlamydia if:  You are sexually active and are younger than 61 years of age.  You are older than 61 years of age and your health care provider tells you that you are at risk for this type of infection.  Your sexual activity has changed since you were last screened and you are at an increased risk for chlamydia or gonorrhea. Ask your health care provider if you are at risk.  If you do not have HIV, but are at risk, it may be recommended that you take a prescription medicine daily to prevent HIV infection. This is called pre-exposure prophylaxis  (PrEP). You are considered at risk if:  You are sexually active and do not regularly use condoms or know the HIV status of your partner(s).  You take drugs by injection.  You are sexually active with a partner who has HIV. Talk with your health care  provider about whether you are at high risk of being infected with HIV. If you choose to begin PrEP, you should first be tested for HIV. You should then be tested every 3 months for as long as you are taking PrEP.  PREGNANCY   If you are premenopausal and you may become pregnant, ask your health care provider about preconception counseling.  If you may become pregnant, take 400 to 800 micrograms (mcg) of folic acid every day.  If you want to prevent pregnancy, talk to your health care provider about birth control (contraception). OSTEOPOROSIS AND MENOPAUSE   Osteoporosis is a disease in which the bones lose minerals and strength with aging. This can result in serious bone fractures. Your risk for osteoporosis can be identified using a bone density scan.  If you are 69 years of age or older, or if you are at risk for osteoporosis and fractures, ask your health care provider if you should be screened.  Ask your health care provider whether you should take a calcium or vitamin D supplement to lower your risk for osteoporosis.  Menopause may have certain physical symptoms and risks.  Hormone replacement therapy may reduce some of these symptoms and risks. Talk to your health care provider about whether hormone replacement therapy is right for you.  HOME CARE INSTRUCTIONS   Schedule regular health, dental, and eye exams.  Stay current with your immunizations.   Do not use any tobacco products including cigarettes, chewing tobacco, or electronic cigarettes.  If you are pregnant, do not drink alcohol.  If you are breastfeeding, limit how much and how often you drink alcohol.  Limit alcohol intake to no more than 1 drink per day for  nonpregnant women. One drink equals 12 ounces of beer, 5 ounces of wine, or 1 ounces of hard liquor.  Do not use street drugs.  Do not share needles.  Ask your health care provider for help if you need support or information about quitting drugs.  Tell your health care provider if you often feel depressed.  Tell your health care provider if you have ever been abused or do not feel safe at home. Document Released: 10/19/2010 Document Revised: 08/20/2013 Document Reviewed: 03/07/2013 [facility] Patient Information 2015 [place], [place]. This information is not intended to replace advice given to you by your health care provider. Make sure you discuss any questions you have with your health care provider.

## 2014-08-08 ENCOUNTER — Telehealth: Payer: Self-pay | Admitting: Internal Medicine

## 2014-08-08 ENCOUNTER — Encounter: Payer: Self-pay | Admitting: Internal Medicine

## 2014-08-08 DIAGNOSIS — M797 Fibromyalgia: Secondary | ICD-10-CM | POA: Diagnosis not present

## 2014-08-08 DIAGNOSIS — D62 Acute posthemorrhagic anemia: Secondary | ICD-10-CM | POA: Insufficient documentation

## 2014-08-08 DIAGNOSIS — I2510 Atherosclerotic heart disease of native coronary artery without angina pectoris: Secondary | ICD-10-CM | POA: Diagnosis not present

## 2014-08-08 DIAGNOSIS — Z4789 Encounter for other orthopedic aftercare: Secondary | ICD-10-CM | POA: Diagnosis not present

## 2014-08-08 MED ORDER — METOPROLOL TARTRATE 25 MG PO TABS: 12.5000 mg | ORAL_TABLET | Freq: Two times a day (BID) | ORAL | Status: DC

## 2014-08-08 MED ORDER — PANTOPRAZOLE SODIUM 40 MG PO TBEC: 40.0000 mg | DELAYED_RELEASE_TABLET | Freq: Every day | ORAL | Status: DC

## 2014-08-08 MED ORDER — FUROSEMIDE 40 MG PO TABS: 40.0000 mg | ORAL_TABLET | Freq: Every day | ORAL | Status: DC | PRN

## 2014-08-08 MED ORDER — DULOXETINE HCL 60 MG PO CPEP: 60.0000 mg | ORAL_CAPSULE | Freq: Every day | ORAL | Status: DC

## 2014-08-08 MED ORDER — CIPROFLOXACIN HCL 500 MG PO TABS: 500.0000 mg | ORAL_TABLET | Freq: Two times a day (BID) | ORAL | Status: DC

## 2014-08-08 NOTE — Telephone Encounter (Signed)
Have sent in ciprofloxacin 500 mg BID for 5 days.

## 2014-08-08 NOTE — Progress Notes (Signed)
   Subjective:    Patient ID: [name], female    DOB: [birth year], 59 y.o.   MRN: [id number]  HPI The patient is a 59 YO female who is coming in for her joint disease and fibromyalgia. She has recently had joint replacement (lumbar fusion) and spent some time in a rehab facility. She is doing okay and the pain is less than before the surgery but still present all day (6/10 down from 8/10 all the time). She is still following with the neurosurgeon and is still wearing a back brace to help. She denies falls since being home. She has done some therapy but feels weak still. She is also experiencing an increase in pain overall from the hospital stay and the rehab stay.   PMH, [facility], social history reviewed and updated at visit.   Review of Systems  Constitutional: Positive for activity change and fatigue. Negative for fever, appetite change and unexpected weight change.  HENT: Negative.   Eyes: Negative.   Respiratory: Negative for cough, chest tightness, shortness of breath and wheezing.   Cardiovascular: Negative for chest pain, palpitations and leg swelling.  Gastrointestinal: Negative for nausea, abdominal pain, diarrhea, constipation and abdominal distention.  Musculoskeletal: Positive for myalgias, back pain, arthralgias and gait problem.  Skin: Negative.   Neurological: Positive for numbness. Negative for dizziness, syncope, weakness and headaches.       Chronic in her feet  Psychiatric/Behavioral: Negative.       Objective:   Physical Exam  Constitutional: She is oriented to person, place, and time. She appears well-developed and well-nourished.  HENT:  Head: Normocephalic and atraumatic.  Eyes: EOM are normal.  Neck: Normal range of motion.  Cardiovascular: Normal rate and regular rhythm.   Pulmonary/Chest: Effort normal.  Abdominal: Soft. She exhibits no distension. There is no tenderness. There is no rebound.  Back brace in place  Musculoskeletal: She exhibits no  edema.  Neurological: She is alert and oriented to person, place, and time. Coordination abnormal.  Slow gait  Skin: Skin is warm and dry.   Filed Vitals:   08/07/14 1042  BP: 136/82  Pulse: 69  Temp: 98.1 F (36.7 C)  TempSrc: Oral  Resp: 18  Height: 5' 7.5" (1.715 m)  Weight: 225 lb (102.059 kg)  SpO2: 95%      Assessment & Plan:  Given tdap at visit.

## 2014-08-08 NOTE — Assessment & Plan Note (Signed)
Recheck CBC and anemia panel (due to gastric bypass may not have good reserves of nutrients to make new RBCs).

## 2014-08-08 NOTE — Telephone Encounter (Signed)
Patient was prescribed an antibiotic yesterday. I think maybe this fosfomycin (MONUROL) 3 G PACK [[id number]. It is going to cost her $70 and she was wondering if there is another medication that is not so expensive.

## 2014-08-08 NOTE — Telephone Encounter (Signed)
Please advise, thanks.

## 2014-08-08 NOTE — Assessment & Plan Note (Signed)
She has started on cymbalta and this has helped a good amount in the rehab facility. Will refill and ask her to continue with cymbalta 60 mg daily.

## 2014-08-08 NOTE — Assessment & Plan Note (Signed)
Resolved since gastric bypass so not likely to be true asthma so will resolve.

## 2014-08-08 NOTE — Assessment & Plan Note (Signed)
BP well controlled on lasix and metoprolol. It is unclear why she is not on ACE-I or ARB since she has hx of diabetes. Will check labs and adjust as needed.

## 2014-08-08 NOTE — Assessment & Plan Note (Signed)
Last HgA1c not in the diabetic range and was cured with her gastric bypass surgery. Will continue to monitor her HgA1c. Not on ACE-I or ARB.

## 2014-08-09 ENCOUNTER — Other Ambulatory Visit: Payer: Self-pay | Admitting: *Deleted

## 2014-08-09 DIAGNOSIS — M797 Fibromyalgia: Secondary | ICD-10-CM | POA: Diagnosis not present

## 2014-08-09 DIAGNOSIS — Z4789 Encounter for other orthopedic aftercare: Secondary | ICD-10-CM | POA: Diagnosis not present

## 2014-08-09 DIAGNOSIS — I2510 Atherosclerotic heart disease of native coronary artery without angina pectoris: Secondary | ICD-10-CM | POA: Diagnosis not present

## 2014-08-09 MED ORDER — METOCLOPRAMIDE HCL 5 MG PO TABS: 5.0000 mg | ORAL_TABLET | Freq: Four times a day (QID) | ORAL | Status: DC | PRN

## 2014-08-09 MED ORDER — TOPIRAMATE 100 MG PO TABS: 100.0000 mg | ORAL_TABLET | Freq: Two times a day (BID) | ORAL | Status: DC

## 2014-08-09 MED ORDER — GABAPENTIN 400 MG PO CAPS: 400.0000 mg | ORAL_CAPSULE | Freq: Four times a day (QID) | ORAL | Status: DC

## 2014-08-09 MED ORDER — PANTOPRAZOLE SODIUM 40 MG PO TBEC: 40.0000 mg | DELAYED_RELEASE_TABLET | Freq: Every day | ORAL | Status: DC

## 2014-08-09 MED ORDER — POTASSIUM CHLORIDE CRYS ER 20 MEQ PO TBCR: 20.0000 meq | EXTENDED_RELEASE_TABLET | Freq: Every day | ORAL | Status: DC

## 2014-08-09 MED ORDER — FUROSEMIDE 40 MG PO TABS: 40.0000 mg | ORAL_TABLET | Freq: Every day | ORAL | Status: DC | PRN

## 2014-08-09 MED ORDER — METOPROLOL TARTRATE 25 MG PO TABS: 12.5000 mg | ORAL_TABLET | Freq: Two times a day (BID) | ORAL | Status: DC

## 2014-08-09 NOTE — Telephone Encounter (Signed)
There is increased risk of serotonin syndrome when tramadol is combined with duloxetine. Therefore I recommend to have this approved by [name] before refilling.

## 2014-08-09 NOTE — Telephone Encounter (Signed)
Pt informed

## 2014-08-09 NOTE — Telephone Encounter (Signed)
Received fax pt requesting refill on her Tramadol 50 mg. MD is out of office until Tuesday is this ok to refill...Johny Chess

## 2014-08-13 ENCOUNTER — Other Ambulatory Visit: Payer: Self-pay | Admitting: Geriatric Medicine

## 2014-08-13 ENCOUNTER — Telehealth: Payer: Self-pay | Admitting: Internal Medicine

## 2014-08-13 DIAGNOSIS — Z4789 Encounter for other orthopedic aftercare: Secondary | ICD-10-CM | POA: Diagnosis not present

## 2014-08-13 DIAGNOSIS — I2510 Atherosclerotic heart disease of native coronary artery without angina pectoris: Secondary | ICD-10-CM | POA: Diagnosis not present

## 2014-08-13 DIAGNOSIS — M797 Fibromyalgia: Secondary | ICD-10-CM | POA: Diagnosis not present

## 2014-08-13 MED ORDER — METOPROLOL TARTRATE 25 MG PO TABS: 12.5000 mg | ORAL_TABLET | Freq: Two times a day (BID) | ORAL | Status: DC

## 2014-08-13 MED ORDER — GABAPENTIN 400 MG PO CAPS: 400.0000 mg | ORAL_CAPSULE | Freq: Four times a day (QID) | ORAL | Status: DC

## 2014-08-13 NOTE — Telephone Encounter (Signed)
I will decline as her orthopedic should be managing her pain and she is also on another opioid which makes risk of overdose higher with two different medications.

## 2014-08-13 NOTE — Telephone Encounter (Signed)
[facility] is waiting on a fax from [place] regarding gabapentin (NEURONTIN) 400 MG capsule [[id number], and she thinks traMADol (ULTRAM) 50 MG tablet [[id number].

## 2014-08-13 NOTE — Telephone Encounter (Signed)
Patient states she does not need tramadol.  Patient needs metoprolol tartrate sent instead.

## 2014-08-13 NOTE — Telephone Encounter (Signed)
Sent gabapentin. Please advise on Tramadol.

## 2014-08-13 NOTE — Telephone Encounter (Signed)
Sent to pharmacy 

## 2014-08-14 ENCOUNTER — Other Ambulatory Visit: Payer: Self-pay | Admitting: Geriatric Medicine

## 2014-08-14 MED ORDER — GABAPENTIN 400 MG PO CAPS: 400.0000 mg | ORAL_CAPSULE | Freq: Four times a day (QID) | ORAL | Status: DC

## 2014-08-14 MED ORDER — METOPROLOL TARTRATE 25 MG PO TABS: 12.5000 mg | ORAL_TABLET | Freq: Two times a day (BID) | ORAL | Status: DC

## 2014-08-15 ENCOUNTER — Other Ambulatory Visit: Payer: Self-pay | Admitting: Geriatric Medicine

## 2014-08-15 MED ORDER — POTASSIUM CHLORIDE CRYS ER 20 MEQ PO TBCR: 20.0000 meq | EXTENDED_RELEASE_TABLET | Freq: Every day | ORAL | Status: DC

## 2014-08-15 MED ORDER — PANTOPRAZOLE SODIUM 40 MG PO TBEC: 40.0000 mg | DELAYED_RELEASE_TABLET | Freq: Every day | ORAL | Status: DC

## 2014-08-16 ENCOUNTER — Other Ambulatory Visit: Payer: Self-pay | Admitting: Geriatric Medicine

## 2014-08-16 LAB — COPPER, FREE: Copper, Free: NOT DETECTED mcg/L

## 2014-08-16 MED ORDER — FUROSEMIDE 40 MG PO TABS: 40.0000 mg | ORAL_TABLET | Freq: Every day | ORAL | Status: DC | PRN

## 2014-08-16 MED ORDER — METOCLOPRAMIDE HCL 5 MG PO TABS: 5.0000 mg | ORAL_TABLET | Freq: Four times a day (QID) | ORAL | Status: DC | PRN

## 2014-08-16 MED ORDER — TOPIRAMATE 100 MG PO TABS: 100.0000 mg | ORAL_TABLET | Freq: Two times a day (BID) | ORAL | Status: DC

## 2014-08-20 ENCOUNTER — Other Ambulatory Visit: Payer: Self-pay | Admitting: Geriatric Medicine

## 2014-08-20 MED ORDER — GABAPENTIN 400 MG PO CAPS: ORAL_CAPSULE | ORAL | Status: DC

## 2014-08-23 ENCOUNTER — Ambulatory Visit: Payer: Commercial Managed Care - HMO | Admitting: Internal Medicine

## 2014-08-23 ENCOUNTER — Ambulatory Visit (INDEPENDENT_AMBULATORY_CARE_PROVIDER_SITE_OTHER): Payer: Commercial Managed Care - HMO | Admitting: Internal Medicine

## 2014-08-23 ENCOUNTER — Other Ambulatory Visit: Payer: Commercial Managed Care - HMO

## 2014-08-23 ENCOUNTER — Encounter: Payer: Self-pay | Admitting: Internal Medicine

## 2014-08-23 VITALS — BP 116/70 | HR 71 | Temp 97.9°F | Resp 16 | Ht 67.0 in | Wt 227.5 lb

## 2014-08-23 DIAGNOSIS — R300 Dysuria: Secondary | ICD-10-CM

## 2014-08-23 DIAGNOSIS — R8290 Unspecified abnormal findings in urine: Secondary | ICD-10-CM

## 2014-08-23 DIAGNOSIS — R350 Frequency of micturition: Secondary | ICD-10-CM | POA: Diagnosis not present

## 2014-08-23 DIAGNOSIS — R8299 Other abnormal findings in urine: Secondary | ICD-10-CM | POA: Diagnosis not present

## 2014-08-23 LAB — POCT URINALYSIS DIPSTICK
BILIRUBIN UA: NEGATIVE
Blood, UA: NEGATIVE
Glucose, UA: NEGATIVE
KETONES UA: NEGATIVE
Leukocytes, UA: NEGATIVE
Nitrite, UA: NEGATIVE
Protein, UA: 0.15
Spec Grav, UA: >=1.03
pH, UA: 6

## 2014-08-23 MED ORDER — PHENAZOPYRIDINE HCL 200 MG PO TABS: 200.0000 mg | ORAL_TABLET | Freq: Three times a day (TID) | ORAL | Status: DC | PRN

## 2014-08-23 NOTE — Progress Notes (Signed)
   Subjective:    Patient ID: [name], female    DOB: 10/03/1955, 59 y.o.   MRN: [id number]  HPI  Her symptoms began 08/20/14 as urgency and a strong odor to the urine. This was followed by hesitancy and frequency. She also had nocturia up to 3 times per night. She has some mid low back pain but also has had back surgery. She treats symptoms with cranberry juice and increased fluid intake.  She did take antibiotics within the last 30 days for possible urinary tract infection. Specifically she took Cipro. There was no culture on record.  She has no history of recurrent urinary tract infections; renal calculi; genitourinary anomaly; or cystoscopy or other GU procedure. She is a diabetic.  Review of Systems She denies dysuria, hematuria, or pyuria. She's had no fever, chills, sweats. She has no incontinence of urine or stool.      Objective:   Physical Exam  Pertinent or positive findings include: She ambulates slowly with a broad unsteady gait. She uses a cane.  She is wearing a soft brace over her back and over the right ankle.  She is edentulous.  There is an S4 with slurring.  General appearance :adequately nourished; in no distress. BMI: 35.62 Eyes: No conjunctival inflammation or scleral icterus is present. Oral exam:  Lips and gums are healthy appearing.There is no oropharyngeal erythema or exudate noted.  Heart:  Normal rate and regular rhythm. S1 and S2 normal without gallop, murmur, click, or rub  Lungs:Chest clear to auscultation; no wheezes, rhonchi,rales ,or rubs present.No increased work of breathing.  Abdomen: bowel sounds normal, soft and non-tender without masses, organomegaly or hernias noted.  No guarding or rebound.  Vascular : all pulses equal ; no bruits present. Skin:Warm & dry.  Intact without suspicious lesions or rashes ; no tenting  Lymphatic: No lymphadenopathy is noted about the head, neck, axilla.  Neuro: Strength, tone decreased.     Assessment  & Plan:  #1 malordorous urine;urgency;frequency with normal urinalysis  Plan: she'll be placed on Pyridium pending results of the culture and sensitivity.

## 2014-08-23 NOTE — Progress Notes (Signed)
Pre visit review using our clinic review tool, if applicable. No additional management support is needed unless otherwise documented below in the visit note. 

## 2014-08-23 NOTE — Patient Instructions (Signed)
Drink as much nondairy fluids as possible. Avoid spicy foods or alcohol as  these may aggravate the bladder. Do not take decongestants. Avoid narcotics if possible. 

## 2014-08-24 LAB — URINE CULTURE
COLONY COUNT: NO GROWTH
Organism ID, Bacteria: NO GROWTH

## 2014-08-28 DIAGNOSIS — M4806 Spinal stenosis, lumbar region: Secondary | ICD-10-CM | POA: Diagnosis not present

## 2014-08-28 DIAGNOSIS — M5416 Radiculopathy, lumbar region: Secondary | ICD-10-CM | POA: Diagnosis not present

## 2014-08-28 DIAGNOSIS — M4316 Spondylolisthesis, lumbar region: Secondary | ICD-10-CM | POA: Diagnosis not present

## 2014-08-28 DIAGNOSIS — Z6836 Body mass index (BMI) 36.0-36.9, adult: Secondary | ICD-10-CM | POA: Diagnosis not present

## 2014-08-28 DIAGNOSIS — M5127 Other intervertebral disc displacement, lumbosacral region: Secondary | ICD-10-CM | POA: Diagnosis not present

## 2014-08-28 DIAGNOSIS — R030 Elevated blood-pressure reading, without diagnosis of hypertension: Secondary | ICD-10-CM | POA: Diagnosis not present

## 2014-08-29 DIAGNOSIS — M797 Fibromyalgia: Secondary | ICD-10-CM | POA: Diagnosis not present

## 2014-08-29 DIAGNOSIS — Z4789 Encounter for other orthopedic aftercare: Secondary | ICD-10-CM | POA: Diagnosis not present

## 2014-08-29 DIAGNOSIS — I2510 Atherosclerotic heart disease of native coronary artery without angina pectoris: Secondary | ICD-10-CM | POA: Diagnosis not present

## 2014-09-04 ENCOUNTER — Encounter: Payer: Self-pay | Admitting: Internal Medicine

## 2014-09-04 ENCOUNTER — Ambulatory Visit (INDEPENDENT_AMBULATORY_CARE_PROVIDER_SITE_OTHER): Payer: Commercial Managed Care - HMO | Admitting: Internal Medicine

## 2014-09-04 VITALS — BP 128/74 | HR 64 | Temp 98.0°F | Wt 235.0 lb

## 2014-09-04 DIAGNOSIS — M5416 Radiculopathy, lumbar region: Secondary | ICD-10-CM

## 2014-09-04 DIAGNOSIS — R32 Unspecified urinary incontinence: Secondary | ICD-10-CM

## 2014-09-04 DIAGNOSIS — R351 Nocturia: Secondary | ICD-10-CM | POA: Diagnosis not present

## 2014-09-04 DIAGNOSIS — R3589 Other polyuria: Secondary | ICD-10-CM

## 2014-09-04 DIAGNOSIS — R358 Other polyuria: Secondary | ICD-10-CM

## 2014-09-04 DIAGNOSIS — R159 Full incontinence of feces: Secondary | ICD-10-CM

## 2014-09-04 NOTE — Progress Notes (Signed)
   Subjective:    Patient ID: [name], female    DOB: 03/26/1956, 59 y.o.   MRN: [id number]  HPI  For one month she has had numbness and pain in the right lateral foot and dorsum of the foot. This has been intermittent. It is associated with weakness in the foot as well. The symptoms can last hours. Wearing an Ace wrap when walking has decreased associated pain but not the numbness which is mainly in the fourth and fifth toes on the right. The pain is described as dull, burning and up to level VIII. Symptoms can occur whether she is sitting or walking.  She has had some incontinence of urine and stool since shortly after her neurosurgery at L4-5; L5-S1 in February of this year.  She did see her Neurosurgeon last week but did not mention the symptoms.   She's also had polyuria as well as nocturia up to 3 times nightly. She did question a urinary tract infection but recent culture revealed no growth.  She has had mild hyperglycemia with glucoses as high as 135. Hemoglobin A1c was 5.3% on 06/26/14  Review of Systems She has no fever, chills, sweats, weight loss.  There's been no change in temperature or color skin in the area of the symptoms.  There has been no associated rash.    Objective:   Physical Exam  General appearance :adequately nourished; in no distress.  Eyes: No conjunctival inflammation or scleral icterus is present.   Heart:  Normal rate and regular rhythm. S1 and S2 normal without gallop, murmur, click, rub or other extra sounds    Lungs:Chest clear to auscultation; no wheezes, rhonchi,rales ,or rubs present.No increased work of breathing.   Abdomen: bowel sounds normal, soft and non-tender without masses, organomegaly or hernias noted.  No guarding or rebound.   Vascular : Pedal pulses are equal but decreased  ; no bruits present.  Skin:Warm & dry.  Intact without suspicious lesions or rashes ; no tenting   Lymphatic: No lymphadenopathy is noted about the  head, neck, axilla  Neuro: Strength, tone normal.Ambulating with cane.Wearing brace brace.        Assessment & Plan:  #1 lumbosacral radiculopathy #2 stool and urinary incontinence  #3 polyuria and nocturia  #4 hyperglycemia Plan: Gabapentin every 8 hours Send record to Dr Marcelino Freestone

## 2014-09-04 NOTE — Patient Instructions (Signed)
Assess response to the gabapentin one every 8 hours as needed. The Neurosurgery and Urology  referral will be scheduled and you'll be notified of the time.Please call the Referral Co-Ordinator @ [phone] if you have not been notified of appointment time within 7-10 days.

## 2014-09-04 NOTE — Progress Notes (Signed)
Pre visit review using our clinic review tool, if applicable. No additional management support is needed unless otherwise documented below in the visit note. 

## 2014-09-14 ENCOUNTER — Other Ambulatory Visit: Payer: Self-pay | Admitting: Physical Medicine & Rehabilitation

## 2014-09-23 ENCOUNTER — Ambulatory Visit: Payer: Medicare Other | Admitting: Internal Medicine

## 2014-10-09 DIAGNOSIS — M5127 Other intervertebral disc displacement, lumbosacral region: Secondary | ICD-10-CM | POA: Diagnosis not present

## 2014-10-09 DIAGNOSIS — M545 Low back pain: Secondary | ICD-10-CM | POA: Diagnosis not present

## 2014-10-09 DIAGNOSIS — M5416 Radiculopathy, lumbar region: Secondary | ICD-10-CM | POA: Diagnosis not present

## 2014-10-09 DIAGNOSIS — M4806 Spinal stenosis, lumbar region: Secondary | ICD-10-CM | POA: Diagnosis not present

## 2014-10-09 DIAGNOSIS — M7061 Trochanteric bursitis, right hip: Secondary | ICD-10-CM | POA: Diagnosis not present

## 2014-10-09 DIAGNOSIS — M4316 Spondylolisthesis, lumbar region: Secondary | ICD-10-CM | POA: Diagnosis not present

## 2014-10-15 ENCOUNTER — Telehealth: Payer: Self-pay | Admitting: Internal Medicine

## 2014-10-23 ENCOUNTER — Telehealth: Payer: Self-pay | Admitting: Internal Medicine

## 2014-10-23 DIAGNOSIS — M25579 Pain in unspecified ankle and joints of unspecified foot: Secondary | ICD-10-CM

## 2014-10-23 NOTE — Telephone Encounter (Signed)
Patient is calling regarding that her foot is still bothering her and would like a referral to a podiatrist.  Please advise

## 2014-10-24 NOTE — Telephone Encounter (Signed)
Referral placed.

## 2014-10-28 ENCOUNTER — Encounter: Payer: Self-pay | Admitting: Podiatry

## 2014-10-28 ENCOUNTER — Ambulatory Visit (INDEPENDENT_AMBULATORY_CARE_PROVIDER_SITE_OTHER): Payer: Commercial Managed Care - HMO | Admitting: Podiatry

## 2014-10-28 ENCOUNTER — Ambulatory Visit: Payer: Commercial Managed Care - HMO

## 2014-10-28 VITALS — BP 121/68 | HR 59 | Resp 15

## 2014-10-28 DIAGNOSIS — M779 Enthesopathy, unspecified: Secondary | ICD-10-CM | POA: Diagnosis not present

## 2014-10-28 DIAGNOSIS — M79672 Pain in left foot: Secondary | ICD-10-CM

## 2014-10-28 DIAGNOSIS — M79671 Pain in right foot: Secondary | ICD-10-CM

## 2014-10-28 MED ORDER — TRIAMCINOLONE ACETONIDE 10 MG/ML IJ SUSP
10.0000 mg | Freq: Once | INTRAMUSCULAR | Status: AC
  Administered 2014-10-28: 10 mg

## 2014-10-28 NOTE — Progress Notes (Signed)
   Subjective:    Patient ID: [name], female    DOB: 1955/10/06, 59 y.o.   MRN: [id number]  HPI Patient presents with bilateral foot pain, lateral side and knot on both lateral sides of feet. This has been going on since March 2016. Pain started out on the right foot and is now on left foot since June 2016. Pt has soaked feet in epsom salt with some relief. Wearing compression socks helps patient's feet feel better.   Review of Systems  Eyes: Positive for itching.  Musculoskeletal: Positive for myalgias, back pain, arthralgias and gait problem.  Neurological: Positive for numbness.       Objective:   Physical Exam        Assessment & Plan:

## 2014-10-29 NOTE — Progress Notes (Signed)
Subjective:     Patient ID: [name], female   DOB: 1956/03/09, 59 y.o.   MRN: [id number]  HPI patient presents with pain on the outside of both feet stating that it's been hurting her for a while and making walking difficult and she does not remember specific injury   Review of Systems     Objective:   Physical Exam Neurovascular status intact with no change in health history and inflammation pain base of fifth metatarsal bilateral with no indication of muscle dysfunction    Assessment:     Tendinitis of the peroneal insertion Asa fifth metatarsal bilateral    Plan:     Careful sheath injection administered 3 mg Kenalog 5 mg Xylocaine and sterile dressings applied. Advised on ice reduced activity and reappoint if symptoms persist

## 2014-10-30 ENCOUNTER — Telehealth: Payer: Self-pay | Admitting: Internal Medicine

## 2014-10-30 NOTE — Telephone Encounter (Signed)
Left message for patient  Re: [facility] called and patient is needing to contact [employer] so they can change her PCP to [name]

## 2014-11-11 DIAGNOSIS — H52209 Unspecified astigmatism, unspecified eye: Secondary | ICD-10-CM | POA: Diagnosis not present

## 2014-11-11 DIAGNOSIS — Z0100 Encounter for examination of eyes and vision without abnormal findings: Secondary | ICD-10-CM | POA: Diagnosis not present

## 2014-11-11 DIAGNOSIS — H524 Presbyopia: Secondary | ICD-10-CM | POA: Diagnosis not present

## 2014-11-11 DIAGNOSIS — H5203 Hypermetropia, bilateral: Secondary | ICD-10-CM | POA: Diagnosis not present

## 2014-11-11 DIAGNOSIS — H5210 Myopia, unspecified eye: Secondary | ICD-10-CM | POA: Diagnosis not present

## 2014-11-19 ENCOUNTER — Telehealth: Payer: Self-pay | Admitting: *Deleted

## 2014-11-19 NOTE — Telephone Encounter (Signed)
Pt states her feet still hurt.  I reviewed pt's last office visit and Dr. Paulla Dolly instructed pt to make an appt if continues to be painful.  I left message for pt to schedule an appt.

## 2014-11-20 DIAGNOSIS — M4806 Spinal stenosis, lumbar region: Secondary | ICD-10-CM | POA: Diagnosis not present

## 2014-11-20 DIAGNOSIS — M4316 Spondylolisthesis, lumbar region: Secondary | ICD-10-CM | POA: Diagnosis not present

## 2014-11-20 DIAGNOSIS — M545 Low back pain: Secondary | ICD-10-CM | POA: Diagnosis not present

## 2014-11-20 DIAGNOSIS — M5416 Radiculopathy, lumbar region: Secondary | ICD-10-CM | POA: Diagnosis not present

## 2014-11-20 DIAGNOSIS — M7061 Trochanteric bursitis, right hip: Secondary | ICD-10-CM | POA: Diagnosis not present

## 2014-11-25 DIAGNOSIS — Z6837 Body mass index (BMI) 37.0-37.9, adult: Secondary | ICD-10-CM | POA: Diagnosis not present

## 2014-11-25 DIAGNOSIS — M545 Low back pain: Secondary | ICD-10-CM | POA: Diagnosis not present

## 2014-11-25 DIAGNOSIS — M7061 Trochanteric bursitis, right hip: Secondary | ICD-10-CM | POA: Diagnosis not present

## 2014-11-25 DIAGNOSIS — M4806 Spinal stenosis, lumbar region: Secondary | ICD-10-CM | POA: Diagnosis not present

## 2014-11-25 DIAGNOSIS — M5416 Radiculopathy, lumbar region: Secondary | ICD-10-CM | POA: Diagnosis not present

## 2014-11-25 DIAGNOSIS — M4316 Spondylolisthesis, lumbar region: Secondary | ICD-10-CM | POA: Diagnosis not present

## 2014-11-25 DIAGNOSIS — M5127 Other intervertebral disc displacement, lumbosacral region: Secondary | ICD-10-CM | POA: Diagnosis not present

## 2014-11-27 DIAGNOSIS — M545 Low back pain: Secondary | ICD-10-CM | POA: Diagnosis not present

## 2014-11-27 DIAGNOSIS — M5136 Other intervertebral disc degeneration, lumbar region: Secondary | ICD-10-CM | POA: Diagnosis not present

## 2014-11-27 DIAGNOSIS — R293 Abnormal posture: Secondary | ICD-10-CM | POA: Diagnosis not present

## 2014-11-27 DIAGNOSIS — M6281 Muscle weakness (generalized): Secondary | ICD-10-CM | POA: Diagnosis not present

## 2014-11-29 DIAGNOSIS — R293 Abnormal posture: Secondary | ICD-10-CM | POA: Diagnosis not present

## 2014-11-29 DIAGNOSIS — M545 Low back pain: Secondary | ICD-10-CM | POA: Diagnosis not present

## 2014-11-29 DIAGNOSIS — M6281 Muscle weakness (generalized): Secondary | ICD-10-CM | POA: Diagnosis not present

## 2014-11-29 DIAGNOSIS — M5136 Other intervertebral disc degeneration, lumbar region: Secondary | ICD-10-CM | POA: Diagnosis not present

## 2014-12-05 DIAGNOSIS — M6281 Muscle weakness (generalized): Secondary | ICD-10-CM | POA: Diagnosis not present

## 2014-12-05 DIAGNOSIS — R293 Abnormal posture: Secondary | ICD-10-CM | POA: Diagnosis not present

## 2014-12-05 DIAGNOSIS — M545 Low back pain: Secondary | ICD-10-CM | POA: Diagnosis not present

## 2014-12-05 DIAGNOSIS — M5136 Other intervertebral disc degeneration, lumbar region: Secondary | ICD-10-CM | POA: Diagnosis not present

## 2014-12-11 DIAGNOSIS — M6281 Muscle weakness (generalized): Secondary | ICD-10-CM | POA: Diagnosis not present

## 2014-12-11 DIAGNOSIS — R293 Abnormal posture: Secondary | ICD-10-CM | POA: Diagnosis not present

## 2014-12-11 DIAGNOSIS — M5136 Other intervertebral disc degeneration, lumbar region: Secondary | ICD-10-CM | POA: Diagnosis not present

## 2014-12-11 DIAGNOSIS — M545 Low back pain: Secondary | ICD-10-CM | POA: Diagnosis not present

## 2014-12-13 DIAGNOSIS — R293 Abnormal posture: Secondary | ICD-10-CM | POA: Diagnosis not present

## 2014-12-13 DIAGNOSIS — M6281 Muscle weakness (generalized): Secondary | ICD-10-CM | POA: Diagnosis not present

## 2014-12-13 DIAGNOSIS — M545 Low back pain: Secondary | ICD-10-CM | POA: Diagnosis not present

## 2014-12-13 DIAGNOSIS — M5136 Other intervertebral disc degeneration, lumbar region: Secondary | ICD-10-CM | POA: Diagnosis not present

## 2014-12-14 ENCOUNTER — Ambulatory Visit (INDEPENDENT_AMBULATORY_CARE_PROVIDER_SITE_OTHER): Payer: Commercial Managed Care - HMO | Admitting: Family Medicine

## 2014-12-14 ENCOUNTER — Other Ambulatory Visit (HOSPITAL_COMMUNITY)
Admission: RE | Admit: 2014-12-14 | Discharge: 2014-12-14 | Disposition: A | Discharge status: Home / Self Care | Payer: Commercial Managed Care - HMO | Source: Other Acute Inpatient Hospital | Attending: Family Medicine | Admitting: Family Medicine

## 2014-12-14 ENCOUNTER — Encounter: Payer: Self-pay | Admitting: Family Medicine

## 2014-12-14 VITALS — BP 112/64 | HR 64 | Temp 98.2°F | Wt 219.8 lb

## 2014-12-14 DIAGNOSIS — R300 Dysuria: Secondary | ICD-10-CM | POA: Insufficient documentation

## 2014-12-14 DIAGNOSIS — N3090 Cystitis, unspecified without hematuria: Secondary | ICD-10-CM | POA: Diagnosis not present

## 2014-12-14 LAB — POCT URINALYSIS DIPSTICK
BILIRUBIN UA: NEGATIVE
Blood, UA: POSITIVE
GLUCOSE UA: NEGATIVE
KETONES UA: NEGATIVE
NITRITE UA: NEGATIVE
Protein, UA: POSITIVE
Spec Grav, UA: 1.02
Urobilinogen, UA: 0.2
pH, UA: 6

## 2014-12-14 MED ORDER — CIPROFLOXACIN HCL 250 MG PO TABS: 250.0000 mg | ORAL_TABLET | Freq: Two times a day (BID) | ORAL | Status: DC

## 2014-12-14 NOTE — Addendum Note (Signed)
Addended by: Lowella Dandy on: 12/14/2014 12:52 PM   Modules accepted: Miquel Dunn

## 2014-12-14 NOTE — Patient Instructions (Signed)
Drink plenty of water and start the antibiotics today.  We'll contact you with your lab report.  Take care.   

## 2014-12-14 NOTE — Assessment & Plan Note (Signed)
Likely.  Nontoxic.  Ucx, cipro.  See AVS.  F/u prn.

## 2014-12-14 NOTE — Progress Notes (Signed)
Pre visit review using our clinic review tool, if applicable. No additional management support is needed unless otherwise documented below in the visit note.  Dysuria: yes, with frequency.   duration of symptoms: a few days.  abdominal pain:yes fevers:no back pain: some lower back pain.  H/o back surgery noted in 05/2014.  She had just started aquatic therapy but didn't know if that was causing the back pain.  Then she started having dysuria.  vomiting:no U/a d/w pt.   Meds, vitals, and allergies reviewed.   ROS: See HPI.  Otherwise negative.    GEN: nad, alert and oriented HEENT: mucous membranes moist NECK: supple CV: rrr.  PULM: ctab, no inc wob ABD: soft, +bs, suprapubic area tender EXT: no edema SKIN: no acute rash BACK: no CVA pain

## 2014-12-16 LAB — URINE CULTURE: Culture: 70000

## 2014-12-17 ENCOUNTER — Other Ambulatory Visit: Payer: Self-pay | Admitting: Family Medicine

## 2014-12-17 MED ORDER — CIPROFLOXACIN HCL 250 MG PO TABS: 250.0000 mg | ORAL_TABLET | Freq: Two times a day (BID) | ORAL | Status: DC

## 2014-12-18 DIAGNOSIS — M4316 Spondylolisthesis, lumbar region: Secondary | ICD-10-CM | POA: Diagnosis not present

## 2014-12-18 DIAGNOSIS — M4806 Spinal stenosis, lumbar region: Secondary | ICD-10-CM | POA: Diagnosis not present

## 2014-12-18 DIAGNOSIS — M545 Low back pain: Secondary | ICD-10-CM | POA: Diagnosis not present

## 2014-12-18 DIAGNOSIS — M5416 Radiculopathy, lumbar region: Secondary | ICD-10-CM | POA: Diagnosis not present

## 2014-12-18 DIAGNOSIS — Z6837 Body mass index (BMI) 37.0-37.9, adult: Secondary | ICD-10-CM | POA: Diagnosis not present

## 2014-12-19 ENCOUNTER — Encounter: Payer: Self-pay | Admitting: Internal Medicine

## 2014-12-19 ENCOUNTER — Ambulatory Visit (INDEPENDENT_AMBULATORY_CARE_PROVIDER_SITE_OTHER): Payer: Commercial Managed Care - HMO | Admitting: Internal Medicine

## 2014-12-19 VITALS — BP 128/70 | HR 65 | Temp 98.3°F | Resp 18 | Wt 219.0 lb

## 2014-12-19 DIAGNOSIS — R293 Abnormal posture: Secondary | ICD-10-CM | POA: Diagnosis not present

## 2014-12-19 DIAGNOSIS — Z79899 Other long term (current) drug therapy: Secondary | ICD-10-CM | POA: Diagnosis not present

## 2014-12-19 DIAGNOSIS — M545 Low back pain: Secondary | ICD-10-CM | POA: Diagnosis not present

## 2014-12-19 DIAGNOSIS — M79671 Pain in right foot: Secondary | ICD-10-CM

## 2014-12-19 DIAGNOSIS — M6281 Muscle weakness (generalized): Secondary | ICD-10-CM | POA: Diagnosis not present

## 2014-12-19 DIAGNOSIS — L84 Corns and callosities: Secondary | ICD-10-CM

## 2014-12-19 DIAGNOSIS — M5136 Other intervertebral disc degeneration, lumbar region: Secondary | ICD-10-CM | POA: Diagnosis not present

## 2014-12-19 NOTE — Progress Notes (Signed)
Pre visit review using our clinic review tool, if applicable. No additional management support is needed unless otherwise documented below in the visit note. 

## 2014-12-19 NOTE — Progress Notes (Signed)
   Subjective:    Patient ID: [name], female    DOB: 1956-04-13, 59 y.o.   MRN: [id number]  HPI She describes pain in both feet, greater on the right for 2 months. She's been seeing the Podiatrist who's been injecting the feet with temporary relief. She states that the shots help for 1-2 days but the pain returns. The pain is over the lateral aspect of the right foot and is described as intermittent and sharp. Ice does help it as does a pressure sleeve she has purchased. She has lesser symptoms in the same area along the lateral aspect of the right foot.  I reviewed the x-rays performed by the Podiatrist. There is soft tissue deformity along the lateral aspect of the feet . I do not see any evidence of gout, fracture or other definite metabolic disease.  She has no history of gout ; she's not on HCTZ. There is no family history of gout.  She also questions why she is on citalopram and generic Cymbalta. These as well as Topamax were started while she was in rehabilitation. She has no history of migraines.   Review of Systems Fever, chills, sweats, or unexplained weight loss not present. No associated rash or color or temperature change in skin over the area of pain. Claudication or edema are absent.     Objective:   Physical Exam Pertinent or positive findings include:She ambulates with a cane.The gait demonstrates asymmetric weight distribution, greater on lateral aspect of feet. There is accentuated curvature to the feet with excessive rounding laterally. A large callus formation is present over the right lateral foot. She also has pes planus. The left great toenail is absent. The posterior tibial pulses are decreased.   General appearance :adequately nourished; in no distress.  Eyes: No conjunctival inflammation or scleral icterus is present.  Heart:  Normal rate and regular rhythm. S1 and S2 normal without gallop, murmur, click, rub or other extra sounds    Lungs:Chest  clear to auscultation; no wheezes, rhonchi,rales ,or rubs present.No increased work of breathing.   Abdomen: bowel sounds normal, soft and non-tender without masses, organomegaly or hernias noted.  No guarding or rebound.   Vascular : all pulses equal ; no bruits present.  Skin:Warm & dry.  Intact without suspicious lesions or rashes ; no tenting    Lymphatic: No lymphadenopathy is noted about the head, neck, axilla.   Neuro: Strength, tone decreased.         Assessment & Plan:  #1 pain in the lateral aspect of the feet, right greater than left in the context of foot deformity and callus formation  #2 duplication of SSRI therapy.  #3 Topamax administration in the absence of migraine headaches. This may have been prescribed off label in rehabilitation for some other symptomatology.  I asked her to check with her Pharmacist as to who prescribed Topamax. If there is no definite indication for it ; it should be weaned &  discontinued.  I recommend she decrease the citalopram to half a pill for 7 days and then discontinue it. She should continue the Cymbalta as it does have indication for neuropathic pain treatment.  Referral to Sports Medicine specialist will be made for the feet issues.

## 2014-12-19 NOTE — Patient Instructions (Signed)
The Sports Medicine referral will be scheduled and you'll be notified of the time.Please call the Referral Co-Ordinator @ [phone] if you have not been notified of appointment time within 7-10 days.  Decrease the citalopram to one half pill daily for 1 week and then discontinue it. Continue the generic Cymbalta as this has benefit for neuropathic pain.  Discuss with your Pharmacist who prescribed the Topamax and for what indication as you have no history of migraines. If it's not indicated clinically for any specific condition; it should be weaned and discontinued as well.

## 2014-12-25 DIAGNOSIS — M5136 Other intervertebral disc degeneration, lumbar region: Secondary | ICD-10-CM | POA: Diagnosis not present

## 2014-12-25 DIAGNOSIS — R293 Abnormal posture: Secondary | ICD-10-CM | POA: Diagnosis not present

## 2014-12-25 DIAGNOSIS — M6281 Muscle weakness (generalized): Secondary | ICD-10-CM | POA: Diagnosis not present

## 2014-12-25 DIAGNOSIS — M545 Low back pain: Secondary | ICD-10-CM | POA: Diagnosis not present

## 2015-01-01 DIAGNOSIS — M6281 Muscle weakness (generalized): Secondary | ICD-10-CM | POA: Diagnosis not present

## 2015-01-01 DIAGNOSIS — R293 Abnormal posture: Secondary | ICD-10-CM | POA: Diagnosis not present

## 2015-01-01 DIAGNOSIS — M545 Low back pain: Secondary | ICD-10-CM | POA: Diagnosis not present

## 2015-01-01 DIAGNOSIS — M5136 Other intervertebral disc degeneration, lumbar region: Secondary | ICD-10-CM | POA: Diagnosis not present

## 2015-01-06 DIAGNOSIS — R293 Abnormal posture: Secondary | ICD-10-CM | POA: Diagnosis not present

## 2015-01-06 DIAGNOSIS — M5136 Other intervertebral disc degeneration, lumbar region: Secondary | ICD-10-CM | POA: Diagnosis not present

## 2015-01-06 DIAGNOSIS — M545 Low back pain: Secondary | ICD-10-CM | POA: Diagnosis not present

## 2015-01-06 DIAGNOSIS — M6281 Muscle weakness (generalized): Secondary | ICD-10-CM | POA: Diagnosis not present

## 2015-01-08 DIAGNOSIS — M5136 Other intervertebral disc degeneration, lumbar region: Secondary | ICD-10-CM | POA: Diagnosis not present

## 2015-01-08 DIAGNOSIS — R293 Abnormal posture: Secondary | ICD-10-CM | POA: Diagnosis not present

## 2015-01-08 DIAGNOSIS — M6281 Muscle weakness (generalized): Secondary | ICD-10-CM | POA: Diagnosis not present

## 2015-01-08 DIAGNOSIS — M545 Low back pain: Secondary | ICD-10-CM | POA: Diagnosis not present

## 2015-01-16 ENCOUNTER — Encounter: Payer: Self-pay | Admitting: Family Medicine

## 2015-01-16 ENCOUNTER — Ambulatory Visit (INDEPENDENT_AMBULATORY_CARE_PROVIDER_SITE_OTHER): Payer: Commercial Managed Care - HMO | Admitting: Family Medicine

## 2015-01-16 VITALS — BP 124/78 | HR 70 | Wt 226.0 lb

## 2015-01-16 DIAGNOSIS — G629 Polyneuropathy, unspecified: Secondary | ICD-10-CM | POA: Diagnosis not present

## 2015-01-16 DIAGNOSIS — L84 Corns and callosities: Secondary | ICD-10-CM

## 2015-01-16 MED ORDER — AMOXICILLIN-POT CLAVULANATE 875-125 MG PO TABS: 1.0000 | ORAL_TABLET | Freq: Two times a day (BID) | ORAL | Status: DC

## 2015-01-16 NOTE — Patient Instructions (Signed)
Augmentin 2 times dialy for next10 days Go into the boot for next 2 weeks  B12 1079mcg daily B6 200mg  daily to help with the neuropathy Lotion (aveeno or Eucerin 2-3 times daily to the feet to keep them smooth and hydrated.,  See me again in 2-3 weeks and we will see what needs to be done.

## 2015-01-16 NOTE — Progress Notes (Signed)
  [name] [address], [place] [address] Phone: [phone] Subjective:    I'm seeing this patient by the request  of:  Hoyt Koch, MD Jenny Reichmann, MD  CC: Foot pain  [id number] MERDIS SNODGRASS is a 59 y.o. female coming in with complaint of foot pain.     Past medical history, social, surgical and family history all reviewed in electronic medical record.   Review of Systems: No headache, visual changes, nausea, vomiting, diarrhea, constipation, dizziness, abdominal pain, skin rash, fevers, chills, night sweats, weight loss, swollen lymph nodes, body aches, joint swelling, muscle aches, chest pain, shortness of breath, mood changes.   Objective There were no vitals taken for this visit.  General: No apparent distress alert and oriented x3 mood and affect normal, dressed appropriately.  HEENT: Pupils equal, extraocular movements intact  Respiratory: Patient's speak in full sentences and does not appear short of breath  Cardiovascular: No lower extremity edema, non tender, no erythema  Skin: Warm dry intact with no signs of infection or rash on extremities or on axial skeleton.  Abdomen: Soft nontender  Neuro: Cranial nerves II through XII are intact, neurovascularly intact in all extremities with 2+ DTRs and 2+ pulses.  Lymph: No lymphadenopathy of posterior or anterior cervical chain or axillae bilaterally.  Gait normal with good balance and coordination.  MSK:  Non tender with full range of motion and good stability and symmetric strength and tone of shoulders, elbows, wrist, hip, knee and ankles bilaterally. Moderate osteoarthritic changes of multiple joints Foot exam shows patient does have significant overpronation of the mid and forefoot but does have overpronation of the hindfoot. Patient does have significant peripheral neuropathy of the entire foot bilaterally. 4 out of 5 strength in dorsiflexion but seems to be symmetric.  Good capillary refill. Patient states that the tenderness seems to be over the fourth and fifth metatarsals on the dorsal aspect mostly near the metatarsal joint. Patient does have a large callus formation over the lateral and plantar aspect of the fifth metatarsal on the right side. Significant erythema of the bottom and lateral aspect of the foot consistent with athlete's foot.  Procedure note After verbal consent patient was prepped with alcohol swab and with a 10 blade patient did have debridement of the callus formation on the lateral aspect of the foot. Significant amount was removed and when blood was seen and good tissue with good perfusion was noted stopped. Approximately 1 inch in diameter was taken off. No pain. Minimal blood loss. Post debridement instructions given.    Impression and Recommendations:     This case required medical decision making of moderate complexity.

## 2015-01-16 NOTE — Progress Notes (Signed)
Pre visit review using our clinic review tool, if applicable. No additional management support is needed unless otherwise documented below in the visit note. 

## 2015-01-16 NOTE — Assessment & Plan Note (Signed)
Patient did have debridement today. Patient didn't feel better afterwards and was able to fit even in her sandal better. Patient states it seemed to relieve a lot of pressure. Patient though does have some underlying peripheral neuropathy that is concerning. This could've and secondary from her back surgery based on the timing. Because the redness around the vicinity and [place] doing some debridement Pearson was given in about aches. We discussed icing regimen. Patient will come back and see me again in 2 weeks. We may need do more debridement at that time. Patient may need custom shoes to help as well.

## 2015-01-17 ENCOUNTER — Encounter: Payer: Self-pay | Admitting: Family Medicine

## 2015-01-17 DIAGNOSIS — G629 Polyneuropathy, unspecified: Secondary | ICD-10-CM | POA: Insufficient documentation

## 2015-01-17 NOTE — Assessment & Plan Note (Signed)
Patient will likely have recurrent problems with no feeling in her feet. We discussed B12 supplementation. Patient is artery on a very high dose of gabapentin and cannot increase. Maybe we will need to consider the possibility of changing to Lyrica. Patient states she did not notice this until her back surgery in February. Discuss that she may want to see her surgeon. Otherwise we may need to consider further workup including EMG. We will discuss at follow-up in 3 weeks.

## 2015-01-28 ENCOUNTER — Encounter: Payer: Self-pay | Admitting: Family Medicine

## 2015-01-28 ENCOUNTER — Ambulatory Visit (INDEPENDENT_AMBULATORY_CARE_PROVIDER_SITE_OTHER): Payer: Commercial Managed Care - HMO | Admitting: Family Medicine

## 2015-01-28 VITALS — BP 142/84 | HR 69 | Ht 67.0 in | Wt 226.0 lb

## 2015-01-28 DIAGNOSIS — M84376A Stress fracture, unspecified foot, initial encounter for fracture: Secondary | ICD-10-CM | POA: Insufficient documentation

## 2015-01-28 DIAGNOSIS — R293 Abnormal posture: Secondary | ICD-10-CM | POA: Diagnosis not present

## 2015-01-28 DIAGNOSIS — M5136 Other intervertebral disc degeneration, lumbar region: Secondary | ICD-10-CM | POA: Diagnosis not present

## 2015-01-28 DIAGNOSIS — M545 Low back pain: Secondary | ICD-10-CM | POA: Diagnosis not present

## 2015-01-28 DIAGNOSIS — M6281 Muscle weakness (generalized): Secondary | ICD-10-CM | POA: Diagnosis not present

## 2015-01-28 DIAGNOSIS — M84374G Stress fracture, right foot, subsequent encounter for fracture with delayed healing: Secondary | ICD-10-CM

## 2015-01-28 MED ORDER — VITAMIN D (ERGOCALCIFEROL) 1.25 MG (50000 UNIT) PO CAPS: 50000.0000 [IU] | ORAL_CAPSULE | ORAL | Status: DC

## 2015-01-28 MED ORDER — TRIAMCINOLONE ACETONIDE 0.5 % EX CREA: 1.0000 "application " | TOPICAL_CREAM | Freq: Two times a day (BID) | CUTANEOUS | Status: DC

## 2015-01-28 NOTE — Progress Notes (Signed)
  [name] [address] [place], [place] [address] Phone: [phone] Subjective:    I'm seeing this patient by the request  of:  Hoyt Koch, MD Jenny Reichmann, MD  CC: Foot pain follow up  [id number] [name] is a 59 y.o. female coming in with complaint of foot pain. Patient was seen previously and did have a large callus formation on the lateral aspect of foot. Patient also had significant peripheral neuropathy which is also concerning. Patient did have debridement of the callus and was feeling somewhat better. Patient states the area where the callus was is significant better. Patient though is having more pain still on the fourth and fifth toes. Patient is wearing the postop boot and was not wearing the boot secondary to it hurting her back. Patient feels that the foot has not made any significant progress. Still giving her any discomfort when she walks and still a dull aching pain even without walking. Denies any swelling though. Maybe if she had to say 10-15% better but not pain free.     Past medical history, social, surgical and family history all reviewed in electronic medical record.   Review of Systems: No headache, visual changes, nausea, vomiting, diarrhea, constipation, dizziness, abdominal pain, skin rash, fevers, chills, night sweats, weight loss, swollen lymph nodes, body aches, joint swelling, muscle aches, chest pain, shortness of breath, mood changes.   Objective Blood pressure 142/84, pulse 69, height 5\' 7"  (1.702 m), weight 226 lb (102.513 kg), SpO2 99 %.  General: No apparent distress alert and oriented x3 mood and affect normal, dressed appropriately.  HEENT: Pupils equal, extraocular movements intact  Respiratory: Patient's speak in full sentences and does not appear short of breath  Cardiovascular: No lower extremity edema, non tender, no erythema  Skin: Warm dry intact with no signs of infection or rash on extremities  or on axial skeleton.  Abdomen: Soft nontender  Neuro: Cranial nerves II through XII are intact, neurovascularly intact in all extremities with 2+ DTRs and 2+ pulses.  Lymph: No lymphadenopathy of posterior or anterior cervical chain or axillae bilaterally.  Gait normal with good balance and coordination.  MSK:  Non tender with full range of motion and good stability and symmetric strength and tone of shoulders, elbows, wrist, hip, knee and ankles bilaterally. Moderate osteoarthritic changes of multiple joints Foot exam shows patient does have significant overpronation of the mid and forefoot but does have overpronation of the hindfoot. Patient does have significant peripheral neuropathy of the entire foot bilaterally. 4 out of 5 strength in dorsiflexion but seems to be symmetric. Good capillary refill. Patient states that the tenderness seems to be over the fourth and fifth metatarsals on the dorsal aspect mostly near the metatarsal joint. No reaccumulation of the large callus that was removed at last exam.   Impression and Recommendations:     This case required medical decision making of moderate complexity.

## 2015-01-28 NOTE — Assessment & Plan Note (Signed)
Patient likely does have a stress fracture. Patient peripheral neuropathy will take some time to heal. Patient will do once weekly vitamin D supplementation which I hope will be beneficial. We discussed icing regimen. We discussed wearing the Cam Walker on a more regular basis. Patient will try a heel lift on the other side to compensate for the pain. Patient does have significant back problems as well that couldn't be exacerbated. We discussed with patient though that she will need to continue to watch her blood sugars as well. Patient come back in 3 weeks for further evaluation and treatment.

## 2015-01-28 NOTE — Progress Notes (Signed)
Pre visit review using our clinic review tool, if applicable. No additional management support is needed unless otherwise documented below in the visit note. 

## 2015-01-28 NOTE — Patient Instructions (Addendum)
Good to see you Continue to monitor feet  Back to the boot for 3 weeks Extra insole on other foot to help the back  Vitamin D once weekly now  Triamcinolone on the feet to help the skin Continue the lotion 2-3 times daily See me again in 3 weeks.

## 2015-01-29 ENCOUNTER — Encounter: Payer: Self-pay | Admitting: Internal Medicine

## 2015-01-29 ENCOUNTER — Ambulatory Visit (INDEPENDENT_AMBULATORY_CARE_PROVIDER_SITE_OTHER): Payer: Commercial Managed Care - HMO | Admitting: Internal Medicine

## 2015-01-29 VITALS — BP 120/78 | HR 70 | Temp 97.8°F | Ht 67.0 in | Wt 229.5 lb

## 2015-01-29 DIAGNOSIS — M5136 Other intervertebral disc degeneration, lumbar region: Secondary | ICD-10-CM | POA: Diagnosis not present

## 2015-01-29 DIAGNOSIS — M6281 Muscle weakness (generalized): Secondary | ICD-10-CM | POA: Diagnosis not present

## 2015-01-29 DIAGNOSIS — M545 Low back pain: Secondary | ICD-10-CM | POA: Diagnosis not present

## 2015-01-29 DIAGNOSIS — Z8639 Personal history of other endocrine, nutritional and metabolic disease: Secondary | ICD-10-CM | POA: Diagnosis not present

## 2015-01-29 DIAGNOSIS — R293 Abnormal posture: Secondary | ICD-10-CM | POA: Diagnosis not present

## 2015-01-29 DIAGNOSIS — M84374A Stress fracture, right foot, initial encounter for fracture: Secondary | ICD-10-CM

## 2015-01-29 DIAGNOSIS — R140 Abdominal distension (gaseous): Secondary | ICD-10-CM

## 2015-01-29 NOTE — Progress Notes (Signed)
Pre visit review using our clinic review tool, if applicable. No additional management support is needed unless otherwise documented below in the visit note. 

## 2015-01-29 NOTE — Patient Instructions (Signed)
We have ordered the bone density test to look for any weakness of the bones.   We have given you instructions on low gas producing diet to see if this helps with the stomach. Carbonated beverages are especially bad for making gas.   If that does not help we would like you to go back to a GI or stomach doctor.   Gastric Bypass Surgery, Care After Refer to this sheet in the next few weeks. These discharge instructions provide you with general information on caring for yourself after you leave the hospital. Your caregiver may also give you specific instructions. Your treatment has been planned according to the most current medical practices available, but unavoidable complications sometimes occur. If you have any problems or questions after discharge, call your caregiver. HOME CARE INSTRUCTIONS  Activity  Take frequent walks throughout the day. This will help to prevent blood clots. Do not sit for longer than 45 minutes to 1 hour while awake for 4 to 6 weeks after surgery.  Continue to do coughing and deep breathing exercises once you get home. This will help to prevent pneumonia.  Do not do strenuous activities, such as heavy lifting, pushing, or pulling, until after your follow-up visit with your caregiver. Do not lift anything heavier than 10 lb (4.5 kg).  Talk with your caregiver about when you may return to work and your exercise routine.  Do not drive while taking prescription pain medicine. Nutrition  It is very important that you drink at least 80 oz (2,400 mL) of fluid a day.  You should stay on a liquid diet until your follow-up visit with your caregiver. Keep sugar-free, liquid items on hand, including:  Tea: hot or cold. Drink only decaffeinated for the first month.  Broths: beef, chicken, vegetable.  Others: water, sugar-free frozen ice pops, flavored water, gelatin (after 1 week).  Do not consume caffeine for 1 month. Large amounts of caffeine can cause dehydration.  A  dietician may also give you specific instructions.  Follow your caregiver's recommendations about vitamins and protein requirements after surgery. Hygiene  You may shower and wash your hair 2 days after surgery. Pat incisions dry. Do not rub incisions with a washcloth or towel.  Follow your caregiver's recommendations about baths and pools following surgery. Pain control  If a prescription medicine was given, follow your caregiver's directions.  You may feel some gas pain caused by the carbon dioxide used to inflate your abdomen during surgery. This pain can be felt in your chest, shoulder, back, or abdominal area. Moving around often is advised. Incision care  You may have 4 or more small incisions. They are closed with skin adhesive strips. Skin adhesive strips can get wet and will fall off on their own. Check your incisions and surrounding area daily for any redness, swelling, discoloration, fluid (drainage), or bleeding. Dark red, dried blood may appear under these coverings. This is normal.  If you have a drain, it will be removed at your follow-up visit or before you leave the hospital.  If your drain is left in, follow your caregiver's instructions on drain care.  If your drain is taken out, keep a clean, dry bandage over the drain site. SEEK MEDICAL CARE IF:   You develop persistent nausea and vomiting.  You have pain and discomfort with swallowing.  You have pain, swelling, or warmth in the lower extremities.  You have an oral temperature above 102 F (38.9 C).  You develop chills.  Your incision  sites look red, swollen, or have drainage.  Your stool is black, tarry, or maroon in color.  You are lightheaded when standing.  You notice a bruise getting larger.  You have any questions or concerns. SEEK IMMEDIATE MEDICAL CARE IF:   You have chest pain.  You have severe calf pain or pain not relieved by medicine.  You develop shortness of breath or difficulty  breathing.  There is bright red blood coming from the drain.  You feel confused.  You have slurred speech.  You suddenly feel weak. MAKE SURE YOU:   Understand these instructions.  Will watch your condition.  Will get help right away if you are not doing well or get worse.   This information is not intended to replace advice given to you by your health care provider. Make sure you discuss any questions you have with your health care provider.   Document Released: 11/18/2003 Document Revised: 04/26/2014 Document Reviewed: 08/26/2009 Elsevier Interactive Patient Education [employer].

## 2015-02-02 DIAGNOSIS — R140 Abdominal distension (gaseous): Secondary | ICD-10-CM | POA: Insufficient documentation

## 2015-02-02 NOTE — Assessment & Plan Note (Signed)
Not very distended on exam, talked to her about fodmap diet and she will work on that. If no resolution will refer to GI versus gastric emptying study (due to history of diabetes and symptoms worse after eating).

## 2015-02-02 NOTE — Assessment & Plan Note (Signed)
Unclear if some of her stomach troubles could be related to some gastroparesis. Symptoms only after eating. If no resolution with current plan could be worthwhile to get gastric emptying study.

## 2015-02-02 NOTE — Progress Notes (Signed)
   Subjective:    Patient ID: [name], female    DOB: 18-Oct-1955, 59 y.o.   MRN: [id number]  HPI The patient is a 59 YO female coming in for bloating and gas with pressure in her stomach. She has had problems with this for years. Tries to change her diet without good results. Has not had GI evaluation in some time although she did years ago. This problem was getting better but worsened for no reason about 3-4 weeks ago. No fevers or chills or weight change.   Review of Systems  Constitutional: Negative for fever, activity change, appetite change, fatigue and unexpected weight change.  Respiratory: Negative for cough, chest tightness, shortness of breath and wheezing.   Cardiovascular: Negative for chest pain, palpitations and leg swelling.  Gastrointestinal: Positive for abdominal pain and abdominal distention. Negative for nausea, diarrhea, constipation and blood in stool.  Musculoskeletal: Positive for myalgias, back pain, arthralgias and gait problem.  Skin: Negative.   Neurological: Positive for numbness. Negative for dizziness, syncope, weakness and headaches.       Chronic in her feet  Psychiatric/Behavioral: Negative.       Objective:   Physical Exam  Constitutional: She is oriented to person, place, and time. She appears well-developed and well-nourished.  HENT:  Head: Normocephalic and atraumatic.  Eyes: EOM are normal.  Neck: Normal range of motion.  Cardiovascular: Normal rate and regular rhythm.   Pulmonary/Chest: Effort normal.  Abdominal: Soft. She exhibits no distension. There is no tenderness. There is no rebound.  No obvious distention, mild diffuse tenderness to deep palpation only  Musculoskeletal: She exhibits no edema.  Neurological: She is alert and oriented to person, place, and time. Coordination abnormal.  Slow gait  Skin: Skin is warm and dry.   Filed Vitals:   01/29/15 0915  BP: 120/78  Pulse: 70  Temp: 97.8 F (36.6 C)  TempSrc: Oral    Height: 5\' 7"  (1.702 m)  Weight: 229 lb 8 oz (104.101 kg)  SpO2: 96%      Assessment & Plan:

## 2015-02-03 ENCOUNTER — Ambulatory Visit: Payer: Commercial Managed Care - HMO | Admitting: Family Medicine

## 2015-02-03 DIAGNOSIS — M5136 Other intervertebral disc degeneration, lumbar region: Secondary | ICD-10-CM | POA: Diagnosis not present

## 2015-02-03 DIAGNOSIS — M545 Low back pain: Secondary | ICD-10-CM | POA: Diagnosis not present

## 2015-02-03 DIAGNOSIS — R293 Abnormal posture: Secondary | ICD-10-CM | POA: Diagnosis not present

## 2015-02-03 DIAGNOSIS — M6281 Muscle weakness (generalized): Secondary | ICD-10-CM | POA: Diagnosis not present

## 2015-02-18 ENCOUNTER — Other Ambulatory Visit (INDEPENDENT_AMBULATORY_CARE_PROVIDER_SITE_OTHER): Payer: Commercial Managed Care - HMO

## 2015-02-18 ENCOUNTER — Ambulatory Visit (INDEPENDENT_AMBULATORY_CARE_PROVIDER_SITE_OTHER): Payer: Commercial Managed Care - HMO | Admitting: Family Medicine

## 2015-02-18 ENCOUNTER — Encounter: Payer: Self-pay | Admitting: Family Medicine

## 2015-02-18 VITALS — BP 138/80 | HR 74 | Ht 67.0 in | Wt 229.0 lb

## 2015-02-18 DIAGNOSIS — M79604 Pain in right leg: Secondary | ICD-10-CM | POA: Diagnosis not present

## 2015-02-18 DIAGNOSIS — M79671 Pain in right foot: Secondary | ICD-10-CM

## 2015-02-18 DIAGNOSIS — M79605 Pain in left leg: Secondary | ICD-10-CM

## 2015-02-18 DIAGNOSIS — G6289 Other specified polyneuropathies: Secondary | ICD-10-CM

## 2015-02-18 MED ORDER — "SYRINGE/NEEDLE (DISP) 25G X 5/8"" 3 ML MISC": Status: DC

## 2015-02-18 MED ORDER — CYANOCOBALAMIN 1000 MCG/ML IJ SOLN: 1000.0000 ug | Freq: Once | INTRAMUSCULAR | Status: DC

## 2015-02-18 MED ORDER — PREGABALIN 100 MG PO CAPS: 100.0000 mg | ORAL_CAPSULE | Freq: Two times a day (BID) | ORAL | Status: DC

## 2015-02-18 NOTE — Progress Notes (Signed)
Pre visit review using our clinic review tool, if applicable. No additional management support is needed unless otherwise documented below in the visit note. 

## 2015-02-18 NOTE — Patient Instructions (Addendum)
Good to see you Continue the vitamin D once weekly We will try the injection of the vitamin B12  Lyrica 100mg  3 times daily stop the gabapentin  See me again in 3 weeks.

## 2015-02-18 NOTE — Assessment & Plan Note (Signed)
Patient is having more of a peripheral neuropathy. Concern that this could be centrally acting. Patient does not have a history of diabetes. Patient is taking very high doses of gabapentin without any significant improvement. Discussed with patient we will attempt Lyrica. Patient and will come back in 3 weeks and see how she is doing. We are ordering an EMG to further evaluate. Still no answers we may need to consider further lab workup as well as potentially further imaging. Discussed that patient may want to discuss with her neurosurgeon again as well.

## 2015-02-18 NOTE — Progress Notes (Signed)
  [name] [address], [place] [address] Phone: [phone] Subjective:    I'm seeing this patient by the request  of:  Hoyt Koch, MD Jenny Reichmann, MD  CC: Foot pain follow up  [id number] [name] is a 59 y.o. female coming in with complaint of foot pain. Patient was seen previously and did have a large callus formation on the lateral aspect of foot. Patient also had significant peripheral neuropathy which is also concerning. Patient did have debridement of the callus and was feeling somewhat better. Patient continued have pain and was put in a [employer]. We discussed that this could be potential stress fracture. Patient has been in a [employer] and has not notice any significant improvement at all at this time. Continues to have the numbness. Patient once again states that she did not have the peripheral neuropathy until possibly the back surgery. Patient also states that the same pain and numbness is now in the contralateral foot as well.    Past medical history, social, surgical and family history all reviewed in electronic medical record.   Review of Systems: No headache, visual changes, nausea, vomiting, diarrhea, constipation, dizziness, abdominal pain, skin rash, fevers, chills, night sweats, weight loss, swollen lymph nodes, body aches, joint swelling, muscle aches, chest pain, shortness of breath, mood changes.   Objective Blood pressure 138/80, pulse 74, height 5\' 7"  (1.702 m), weight 229 lb (103.874 kg), SpO2 99 %.  General: No apparent distress alert and oriented x3 mood and affect normal, dressed appropriately.  HEENT: Pupils equal, extraocular movements intact  Respiratory: Patient's speak in full sentences and does not appear short of breath  Cardiovascular: No lower extremity edema, non tender, no erythema  Skin: Warm dry intact with no signs of infection or rash on extremities or on axial skeleton.  Abdomen: Soft  nontender  Neuro: Cranial nerves II through XII are intact, neurovascularly intact in all extremities with 2+ DTRs and 2+ pulses.  Lymph: No lymphadenopathy of posterior or anterior cervical chain or axillae bilaterally.  Gait normal with good balance and coordination.  MSK:  Non tender with full range of motion and good stability and symmetric strength and tone of shoulders, elbows, wrist, hip, knee and ankles bilaterally. Moderate osteoarthritic changes of multiple joints Foot exam shows patient does have significant overpronation of the mid and forefoot but does have overpronation of the hindfoot. Patient does have significant peripheral neuropathy of the entire foot bilaterally going to 1 cm above ankle joint. minorly worse than previous exam. Mild weakness as well as the ankles bilaterally compared to previous exam. Rate of 4 out of 5. Back exam is negative first straight leg test bilaterally.  Limited muscular skeletal ultrasound was performed and interpreted by Hulan Saas, M Limited ultrasound patient's foot showsno significant bony abnormality at this time. Mild soft tissue swelling over the base of the fifth metatarsal but no significant bone changes.   Impression and Recommendations:     This case required medical decision making of moderate complexity.

## 2015-02-19 ENCOUNTER — Other Ambulatory Visit: Payer: Self-pay | Admitting: *Deleted

## 2015-02-19 ENCOUNTER — Telehealth: Payer: Self-pay | Admitting: Family Medicine

## 2015-02-19 DIAGNOSIS — M545 Low back pain: Secondary | ICD-10-CM | POA: Diagnosis not present

## 2015-02-19 DIAGNOSIS — M6281 Muscle weakness (generalized): Secondary | ICD-10-CM | POA: Diagnosis not present

## 2015-02-19 DIAGNOSIS — M5136 Other intervertebral disc degeneration, lumbar region: Secondary | ICD-10-CM | POA: Diagnosis not present

## 2015-02-19 DIAGNOSIS — M79605 Pain in left leg: Secondary | ICD-10-CM

## 2015-02-19 DIAGNOSIS — R293 Abnormal posture: Secondary | ICD-10-CM | POA: Diagnosis not present

## 2015-02-19 DIAGNOSIS — M79604 Pain in right leg: Secondary | ICD-10-CM

## 2015-02-19 NOTE — Telephone Encounter (Signed)
Not really.  We could continue the gabapentin and then if not a lot better could add 10 mg of nortiriptyline at night

## 2015-02-19 NOTE — Telephone Encounter (Signed)
Patient called to advise that the B12 is covered, but it is very costly. She wants to know if there is an alternative, or what she needs to do at this point

## 2015-02-19 NOTE — Telephone Encounter (Signed)
Spoke to pt, she states that the b12 was covered but the Lyrica was too expensive. Is there an alternative?

## 2015-02-24 DIAGNOSIS — R293 Abnormal posture: Secondary | ICD-10-CM | POA: Diagnosis not present

## 2015-02-24 DIAGNOSIS — M5136 Other intervertebral disc degeneration, lumbar region: Secondary | ICD-10-CM | POA: Diagnosis not present

## 2015-02-24 DIAGNOSIS — M6281 Muscle weakness (generalized): Secondary | ICD-10-CM | POA: Diagnosis not present

## 2015-02-24 DIAGNOSIS — M545 Low back pain: Secondary | ICD-10-CM | POA: Diagnosis not present

## 2015-02-24 NOTE — Telephone Encounter (Signed)
lmovm for pt to return call.  

## 2015-02-26 DIAGNOSIS — R293 Abnormal posture: Secondary | ICD-10-CM | POA: Diagnosis not present

## 2015-02-26 DIAGNOSIS — M545 Low back pain: Secondary | ICD-10-CM | POA: Diagnosis not present

## 2015-02-26 DIAGNOSIS — M5136 Other intervertebral disc degeneration, lumbar region: Secondary | ICD-10-CM | POA: Diagnosis not present

## 2015-02-26 DIAGNOSIS — M6281 Muscle weakness (generalized): Secondary | ICD-10-CM | POA: Diagnosis not present

## 2015-02-28 NOTE — Telephone Encounter (Signed)
lmovm for pt to return call.  

## 2015-02-28 NOTE — Telephone Encounter (Signed)
Spoke to pt, she is currently taking tramadol 50mg  & gabapentin 400mg  at night & still having a hard time sleeping. She wants to know if it's okay to add the nortriptyline with these 2 other medicines.

## 2015-02-28 NOTE — Telephone Encounter (Signed)
I would want her to half the gabapentin at first and if not too tired then could take the higher dose.

## 2015-03-05 ENCOUNTER — Ambulatory Visit (INDEPENDENT_AMBULATORY_CARE_PROVIDER_SITE_OTHER)
Admission: RE | Admit: 2015-03-05 | Discharge: 2015-03-05 | Disposition: A | Discharge status: Home / Self Care | Payer: Commercial Managed Care - HMO | Source: Ambulatory Visit | Attending: Family Medicine | Admitting: Family Medicine

## 2015-03-05 ENCOUNTER — Encounter: Payer: Self-pay | Admitting: Family Medicine

## 2015-03-05 ENCOUNTER — Ambulatory Visit (INDEPENDENT_AMBULATORY_CARE_PROVIDER_SITE_OTHER): Payer: Commercial Managed Care - HMO | Admitting: Family Medicine

## 2015-03-05 VITALS — BP 132/82 | HR 71 | Ht 69.0 in | Wt 229.0 lb

## 2015-03-05 DIAGNOSIS — M5442 Lumbago with sciatica, left side: Secondary | ICD-10-CM | POA: Diagnosis not present

## 2015-03-05 DIAGNOSIS — M79606 Pain in leg, unspecified: Secondary | ICD-10-CM | POA: Diagnosis not present

## 2015-03-05 DIAGNOSIS — M5416 Radiculopathy, lumbar region: Secondary | ICD-10-CM | POA: Diagnosis not present

## 2015-03-05 DIAGNOSIS — S34109A Unspecified injury to unspecified level of lumbar spinal cord, initial encounter: Secondary | ICD-10-CM | POA: Diagnosis not present

## 2015-03-05 MED ORDER — PREDNISONE 50 MG PO TABS: 50.0000 mg | ORAL_TABLET | Freq: Every day | ORAL | Status: DC

## 2015-03-05 MED ORDER — KETOROLAC TROMETHAMINE 60 MG/2ML IM SOLN
60.0000 mg | Freq: Once | INTRAMUSCULAR | Status: AC
  Administered 2015-03-05: 60 mg via INTRAMUSCULAR

## 2015-03-05 MED ORDER — METHYLPREDNISOLONE ACETATE 80 MG/ML IJ SUSP
80.0000 mg | Freq: Once | INTRAMUSCULAR | Status: AC
  Administered 2015-03-05: 80 mg via INTRAMUSCULAR

## 2015-03-05 MED ORDER — METHOCARBAMOL 500 MG PO TABS: 500.0000 mg | ORAL_TABLET | Freq: Three times a day (TID) | ORAL | Status: DC | PRN

## 2015-03-05 NOTE — Progress Notes (Signed)
  [name] [address] [place], [place] [address] Phone: [phone] Subjective:     CC: Back pain after fall  [id number] [name] is a 59 y.o. female coming in with complaint of back pain after fall. Patient in February did have a interbody fusion of L4-L5 and L5-S1. Patient was having peripheral neuropathy of the legs bilaterally and we were working her up awaiting a nerve conduction study that is scheduled for next week. This is what was given her more of the right foot pain. Patient states that the foot pain seems to be doing decently better. Patient unfortunately continues to have significant discomfort. Patient did fall recently 2 days ago in her kitchen. Patient fell directly on her back. Since then she is having very similar radicular symptoms going down both legs. States that this feels very similar to prior to her surgery. Patient states that the pain seems to be unrelenting. Patient states that she is noticing some mild weakness as well. Patient denies any bowel or bladder incontinence. Ambulate with the aid of a cane. States that possibly the numbness in her feet seem to be worse. Rates the severity of pain as 9 out of 10. Taking the tramadol fairly frequently.     Past medical history, social, surgical and family history all reviewed in electronic medical record.   Review of Systems: No headache, visual changes, nausea, vomiting, diarrhea, constipation, dizziness, abdominal pain, skin rash, fevers, chills, night sweats, weight loss, swollen lymph nodes, body aches, joint swelling, muscle aches, chest pain, shortness of breath, mood changes.   Objective Blood pressure 132/82, pulse 71, height 5\' 9"  (1.753 m), weight 229 lb (103.874 kg), SpO2 97 %.  General: No apparent distress alert and oriented x3 mood and affect normal, dressed appropriately.  HEENT: Pupils equal, extraocular movements intact  Respiratory: Patient's speak in full  sentences and does not appear short of breath  Cardiovascular: No lower extremity edema, non tender, no erythema  Skin: Warm dry intact with no signs of infection or rash on extremities or on axial skeleton.  Abdomen: Soft nontender  Neuro: Cranial nerves II through XII are intact, peripheral neuropathy noted of the L5 dermatomes which seems to be bilaterally..  Lymph: No lymphadenopathy of posterior or anterior cervical chain or axillae bilaterally.  Gait normal with good balance and coordination.  MSK:  Non tender with full range of motion and good stability and symmetric strength and tone of shoulders, elbows, wrist, hip, knee and ankles bilaterally. Arthritic changes of multiple joints Back Exam:  Inspection: Unremarkable  Motion: Flexion 25 deg, Extension 15 deg, Side Bending to 25 deg bilaterally,  Rotation to 15 deg bilaterally  SLR laying: Significant tightness of the hamstrings bilaterally but likely positive bilaterally patie Palpable tenderness: Diffuse tenderness of the pairs spinal musculature but no spinous process tenderness FABER: Unable to do secondary to pain Sensory change: Patient does have some mild numbness with seems to correspond to the L5 nerve roots bilaterally. Significant numbness in the feet bilaterally. Reflexes: 1+ but symmetric. No clonus noted.  Strength at foot  Plantar-flexion: 4/5 Dorsi-flexion: 4/5 Eversion: 5/5 Inversion: 5/5        Impression and Recommendations:     This case required medical decision making of moderate complexity.

## 2015-03-05 NOTE — Patient Instructions (Signed)
Good to see you i will keep an eye out on the nerve study tomorrow Prednisone daily starting tomorrow Robaxin if needed up to 3 times a day Start exercises in 2 days Make an appointment with me next week just in case Xray downstairs today as well.

## 2015-03-05 NOTE — Telephone Encounter (Signed)
Discussed with pt at OV today

## 2015-03-05 NOTE — Progress Notes (Signed)
Pre visit review using our clinic review tool, if applicable. No additional management support is needed unless otherwise documented below in the visit note. 

## 2015-03-05 NOTE — Assessment & Plan Note (Signed)
Patient is having significant pain at this time. Patient given warning signs and went to seek medical attention. Patient has significant weakness or worsening numbness the patient needs to go to the emergency room immediately. Patient given prescription for prednisone as well as a muscle relaxer. Because patient will be on this less than 24 hours a do not think it would make any changes to the EMG test that she is having tomorrow. Discussed with patient I would like her to follow-up with her surgeon. X-rays ordered today to make sure that there is no loosening of the interbody fusion. Also rule out any new fractures. Patient will follow-up again in one week.  Spent  25 minutes with patient face-to-face and had greater than 50% of counseling including as described above in assessment and plan.

## 2015-03-06 ENCOUNTER — Ambulatory Visit (INDEPENDENT_AMBULATORY_CARE_PROVIDER_SITE_OTHER): Payer: Commercial Managed Care - HMO | Admitting: Neurology

## 2015-03-06 DIAGNOSIS — M79604 Pain in right leg: Secondary | ICD-10-CM | POA: Diagnosis not present

## 2015-03-06 DIAGNOSIS — M79605 Pain in left leg: Secondary | ICD-10-CM | POA: Diagnosis not present

## 2015-03-06 DIAGNOSIS — G609 Hereditary and idiopathic neuropathy, unspecified: Secondary | ICD-10-CM

## 2015-03-06 DIAGNOSIS — M5417 Radiculopathy, lumbosacral region: Secondary | ICD-10-CM

## 2015-03-06 NOTE — Procedures (Signed)
[facility]  [address]  [place], [place] [address] Tel: [phone] Fax:  [phone] Test Date:  03/06/2015  Patient: [name] DOB: 1955-06-21 Physician: Narda Amber, DO  Sex: Female Height: 5\' 7"  Ref Phys: [name], M.D.  ID#: [id number]   Technician: Jerilynn Mages. Dean   Patient Complaints: This is a 59 year old female referred for evaluation of bilateral lower extremity pain, paresthesias, and weakness.  NCV & EMG Findings: Extensive electrodiagnostic testing of the left lower extremity and additional studies of the right shows: 1. Bilateral sural sensory responses are within normal limits. Bilateral superficial peroneal sensory responses are absent. 2. The left tibial motor response shows reduced amplitude. Bilateral peroneal and the right tibial motor responses within normal limits. 3. Chronic motor axon loss changes are seen affecting predominantly L2-L4 myotomes bilaterally, without accompanied active denervation. There is also modest chronic motor axon loss changes affecting the muscles below the knee bilaterally.  Impression: 1. The electrophysiologic findings are most consistent with a distal and symmetric sensorimotor polyneuropathy, axon loss in type, affecting the lower extremities. Overall, these findings are moderate in degree electrically. 2. Chronic S1 radiculopathy affecting bilateral lower extremities; moderate in degree electrically. 3. Chronic L2-L4 radiculopathy affecting bilateral lower extremities; moderate in degree electrically.   ___________________________ Narda Amber, DO    Nerve Conduction Studies Anti Sensory Summary Table   Site NR Peak (ms) Norm Peak (ms) P-T Amp (V) Norm P-T Amp  Left Sup Peroneal Anti Sensory (Ant Lat Mall)  33.8C  12 cm  2.1 <4.6 4.4 >3  Right Sup Peroneal Anti Sensory (Ant Lat Mall)  33.8C  12 cm    2.5 <4.6 3.5 >3  Left Sural Anti Sensory (Lat Mall)  33.8C  Calf NR  <4.6  >3  Right  Sural Anti Sensory (Lat Mall)  Calf NR  <4.6  >3   Motor Summary Table   Site NR Onset (ms) Norm Onset (ms) O-P Amp (mV) Norm O-P Amp Site1 Site2 Delta-0 (ms) Dist (cm) Vel (m/s) Norm Vel (m/s)  Left Peroneal Motor (Ext Dig Brev)  33.8C  Ankle    3.4 <6.0 3.3 >2.5 B Fib Ankle 7.7 32.0 42 >40  B Fib    11.1  2.4  Poplt B Fib 2.1 10.0 48 >40  Poplt    13.2  2.2         Right Peroneal Motor (Ext Dig Brev)  33.8C  Ankle    3.8 <6.0 3.4 >2.5 B Fib Ankle 7.0 31.0 44 >40  B Fib    10.8  2.8  Poplt B Fib 1.9 10.0 53 >40  Poplt    12.7  2.7         Left Tibial Motor (Abd Hall Brev)  33.8C  Ankle    4.0 <6.0 2.4 >4 Knee Ankle 9.0 38.0 42 >40  Knee    13.0  1.2         Right Tibial Motor (Abd Hall Brev)  33.8C  Ankle    3.7 <6.0 4.3 >4 Knee Ankle 9.2 38.0 41 >40  Knee    12.9  2.8          H Reflex Studies   NR H-Lat (ms) Lat Norm (ms) L-R H-Lat (ms)  Left Tibial (Gastroc)  33.8C  NR  <35   Right Tibial (Gastroc)  33.8C  NR  <35    EMG   Side Muscle Ins Act Fibs Psw Fasc Number Recrt Dur Dur. Amp Amp. Poly  Poly. Comment  Left AntTibialis Nml Nml Nml Nml Nml Nml Nml Nml Nml Nml Nml Nml N/A  Left Gastroc Nml Nml Nml Nml 1- Mod-R Some 1+ Some 1+ Nml Nml N/A  Left Flex Dig Long Nml Nml Nml Nml Nml Nml Nml Nml Nml Nml Nml Nml N/A  Left RectFemoris Nml Nml Nml Nml 1- Rapid Some 1+ Some 1+ Nml Nml N/A  Left AdductorLong Nml Nml Nml Nml 2- Rapid Some 1+ Few 1+ Nml Nml N/A  Left BicepsFemS Nml Nml Nml Nml 2- Rapid Some 1+ Few 1+ Nml Nml N/A  Left GluteusMed Nml Nml Nml Nml Nml Nml Nml Nml Nml Nml Nml Nml N/A  Right AntTibialis Nml Nml Nml Nml Nml Nml Nml Nml Nml Nml Nml Nml N/A  Right Gastroc Nml Nml Nml Nml 1- Rapid Some 1+ Nml Nml Nml Nml N/A  Right RectFemoris Nml Nml Nml Nml 1- Rapid Some 1+ Some 1+ Nml Nml N/A      Waveforms:

## 2015-03-10 ENCOUNTER — Telehealth: Payer: Self-pay | Admitting: Internal Medicine

## 2015-03-10 NOTE — Telephone Encounter (Signed)
Patient is requesting humana referral to Dr. Vertell Limber with [facility] for surgery follow up on previous surgery.  Can not schedule until has referral.

## 2015-03-11 ENCOUNTER — Ambulatory Visit: Payer: Commercial Managed Care - HMO | Admitting: Family Medicine

## 2015-03-11 NOTE — Telephone Encounter (Signed)
Mcarthur Rossetti Josem Kaufmann [id number] valid 03/11/2015 - 09/07/2015 for 6 visits

## 2015-03-24 DIAGNOSIS — Z6835 Body mass index (BMI) 35.0-35.9, adult: Secondary | ICD-10-CM | POA: Diagnosis not present

## 2015-03-24 DIAGNOSIS — Y92099 Unspecified place in other non-institutional residence as the place of occurrence of the external cause: Secondary | ICD-10-CM | POA: Diagnosis not present

## 2015-03-24 DIAGNOSIS — W19XXXA Unspecified fall, initial encounter: Secondary | ICD-10-CM | POA: Diagnosis not present

## 2015-03-24 DIAGNOSIS — M5416 Radiculopathy, lumbar region: Secondary | ICD-10-CM | POA: Diagnosis not present

## 2015-03-24 DIAGNOSIS — M545 Low back pain: Secondary | ICD-10-CM | POA: Diagnosis not present

## 2015-03-24 DIAGNOSIS — I10 Essential (primary) hypertension: Secondary | ICD-10-CM | POA: Diagnosis not present

## 2015-03-27 DIAGNOSIS — M545 Low back pain: Secondary | ICD-10-CM | POA: Diagnosis not present

## 2015-03-27 DIAGNOSIS — R2689 Other abnormalities of gait and mobility: Secondary | ICD-10-CM | POA: Diagnosis not present

## 2015-03-27 DIAGNOSIS — M6281 Muscle weakness (generalized): Secondary | ICD-10-CM | POA: Diagnosis not present

## 2015-03-27 DIAGNOSIS — R293 Abnormal posture: Secondary | ICD-10-CM | POA: Diagnosis not present

## 2015-04-02 DIAGNOSIS — R2689 Other abnormalities of gait and mobility: Secondary | ICD-10-CM | POA: Diagnosis not present

## 2015-04-02 DIAGNOSIS — M6281 Muscle weakness (generalized): Secondary | ICD-10-CM | POA: Diagnosis not present

## 2015-04-02 DIAGNOSIS — R293 Abnormal posture: Secondary | ICD-10-CM | POA: Diagnosis not present

## 2015-04-02 DIAGNOSIS — M545 Low back pain: Secondary | ICD-10-CM | POA: Diagnosis not present

## 2015-04-07 DIAGNOSIS — R293 Abnormal posture: Secondary | ICD-10-CM | POA: Diagnosis not present

## 2015-04-07 DIAGNOSIS — R2689 Other abnormalities of gait and mobility: Secondary | ICD-10-CM | POA: Diagnosis not present

## 2015-04-07 DIAGNOSIS — M545 Low back pain: Secondary | ICD-10-CM | POA: Diagnosis not present

## 2015-04-07 DIAGNOSIS — M6281 Muscle weakness (generalized): Secondary | ICD-10-CM | POA: Diagnosis not present

## 2015-04-09 DIAGNOSIS — R293 Abnormal posture: Secondary | ICD-10-CM | POA: Diagnosis not present

## 2015-04-09 DIAGNOSIS — M545 Low back pain: Secondary | ICD-10-CM | POA: Diagnosis not present

## 2015-04-09 DIAGNOSIS — M6281 Muscle weakness (generalized): Secondary | ICD-10-CM | POA: Diagnosis not present

## 2015-04-09 DIAGNOSIS — R2689 Other abnormalities of gait and mobility: Secondary | ICD-10-CM | POA: Diagnosis not present

## 2015-04-16 DIAGNOSIS — M545 Low back pain: Secondary | ICD-10-CM | POA: Diagnosis not present

## 2015-04-16 DIAGNOSIS — R2689 Other abnormalities of gait and mobility: Secondary | ICD-10-CM | POA: Diagnosis not present

## 2015-04-16 DIAGNOSIS — R293 Abnormal posture: Secondary | ICD-10-CM | POA: Diagnosis not present

## 2015-04-16 DIAGNOSIS — M6281 Muscle weakness (generalized): Secondary | ICD-10-CM | POA: Diagnosis not present

## 2015-04-18 ENCOUNTER — Ambulatory Visit (INDEPENDENT_AMBULATORY_CARE_PROVIDER_SITE_OTHER): Payer: Commercial Managed Care - HMO | Admitting: Internal Medicine

## 2015-04-18 ENCOUNTER — Encounter: Payer: Self-pay | Admitting: Internal Medicine

## 2015-04-18 VITALS — BP 130/82 | HR 69 | Temp 98.4°F | Resp 20 | Ht 67.0 in | Wt 228.5 lb

## 2015-04-18 DIAGNOSIS — F331 Major depressive disorder, recurrent, moderate: Secondary | ICD-10-CM

## 2015-04-18 DIAGNOSIS — M79672 Pain in left foot: Secondary | ICD-10-CM | POA: Diagnosis not present

## 2015-04-18 DIAGNOSIS — L84 Corns and callosities: Secondary | ICD-10-CM | POA: Diagnosis not present

## 2015-04-18 MED ORDER — DICLOFENAC SODIUM 1 % TD GEL: 2.0000 g | Freq: Three times a day (TID) | TRANSDERMAL | Status: DC | PRN

## 2015-04-18 MED ORDER — BUPROPION HCL ER (SR) 150 MG PO TB12: 150.0000 mg | ORAL_TABLET | Freq: Two times a day (BID) | ORAL | Status: DC

## 2015-04-18 NOTE — Patient Instructions (Signed)
We have sent in wellbutrin to replace the celexa. When you get the wellbutrin stop taking celexa. The next day start taking the wellbutrin. For the first 3 days take 1 pill daily, then increase to taking 1 pill twice daily.   We have sent in the voltaren gel to use on your feet up to 3 times per day.   We will get you in with podiatry for the orthotic.

## 2015-04-18 NOTE — Assessment & Plan Note (Signed)
Stop celexa and start wellbutrin for her depression. She is having a lot of crying spells and feel that her foot pain is causing the exacerbation in her symptoms.

## 2015-04-18 NOTE — Progress Notes (Signed)
   Subjective:    Patient ID: [name], female    DOB: [birth year], 59 y.o.   MRN: [id number]  HPI The patient is a 59 YO female coming in for follow up of two problems. Her left foot pain is still severe and giving her problems. They have tried her on gabapentin, lyrica which have not helped. She is taking tramadol which helps some but for just a few hours. She has inserts and got new shoes which helped some but not much. She had been in a boot for some time which did not help. Next concern is her depression which is not helped by the celexa. She has had good results with wellbutrin in the past but switched due to insurance reasons.   Review of Systems  Constitutional: Negative for fever, activity change, appetite change, fatigue and unexpected weight change.  Respiratory: Negative for cough, chest tightness, shortness of breath and wheezing.   Cardiovascular: Negative for chest pain, palpitations and leg swelling.  Gastrointestinal: Negative for nausea, abdominal pain, diarrhea, constipation, blood in stool and abdominal distention.  Musculoskeletal: Positive for myalgias, back pain, arthralgias and gait problem.  Skin: Negative.   Neurological: Positive for numbness. Negative for dizziness, syncope, weakness and headaches.       Chronic in her feet  Psychiatric/Behavioral: Negative.       Objective:   Physical Exam  Constitutional: She is oriented to person, place, and time. She appears well-developed and well-nourished.  HENT:  Head: Normocephalic and atraumatic.  Eyes: EOM are normal.  Neck: Normal range of motion.  Cardiovascular: Normal rate and regular rhythm.   Pulmonary/Chest: Effort normal.  Abdominal: Soft. She exhibits no distension. There is no tenderness. There is no rebound.  Musculoskeletal: She exhibits no edema.  Callus on the side of the left foot and tender to touch.  Neurological: She is alert and oriented to person, place, and time. Coordination  abnormal.  Slow gait  Skin: Skin is warm and dry.   Filed Vitals:   04/18/15 1103  BP: 130/82  Pulse: 69  Temp: 98.4 F (36.9 C)  TempSrc: Oral  Resp: 20  Height: 5\' 7"  (1.702 m)  Weight: 228 lb 8 oz (103.647 kg)  SpO2: 97%      Assessment & Plan:

## 2015-04-18 NOTE — Progress Notes (Signed)
Pre visit review using our clinic review tool, if applicable. No additional management support is needed unless otherwise documented below in the visit note. 

## 2015-04-18 NOTE — Assessment & Plan Note (Signed)
Causing her pain and refer to podiatry for custom insert. Rx for voltaren gel to see if this helps more with pain. She has tried and failed lyrica and gets mild benefit from gabapentin and tramadol.

## 2015-04-21 DIAGNOSIS — R2689 Other abnormalities of gait and mobility: Secondary | ICD-10-CM | POA: Diagnosis not present

## 2015-04-21 DIAGNOSIS — R293 Abnormal posture: Secondary | ICD-10-CM | POA: Diagnosis not present

## 2015-04-21 DIAGNOSIS — M545 Low back pain: Secondary | ICD-10-CM | POA: Diagnosis not present

## 2015-04-21 DIAGNOSIS — M6281 Muscle weakness (generalized): Secondary | ICD-10-CM | POA: Diagnosis not present

## 2015-04-23 DIAGNOSIS — R293 Abnormal posture: Secondary | ICD-10-CM | POA: Diagnosis not present

## 2015-04-23 DIAGNOSIS — M6281 Muscle weakness (generalized): Secondary | ICD-10-CM | POA: Diagnosis not present

## 2015-04-23 DIAGNOSIS — M545 Low back pain: Secondary | ICD-10-CM | POA: Diagnosis not present

## 2015-04-23 DIAGNOSIS — R2689 Other abnormalities of gait and mobility: Secondary | ICD-10-CM | POA: Diagnosis not present

## 2015-04-30 DIAGNOSIS — M545 Low back pain: Secondary | ICD-10-CM | POA: Diagnosis not present

## 2015-04-30 DIAGNOSIS — M6281 Muscle weakness (generalized): Secondary | ICD-10-CM | POA: Diagnosis not present

## 2015-04-30 DIAGNOSIS — M216X1 Other acquired deformities of right foot: Secondary | ICD-10-CM | POA: Diagnosis not present

## 2015-04-30 DIAGNOSIS — R2689 Other abnormalities of gait and mobility: Secondary | ICD-10-CM | POA: Diagnosis not present

## 2015-04-30 DIAGNOSIS — Q660 Congenital talipes equinovarus: Secondary | ICD-10-CM | POA: Diagnosis not present

## 2015-04-30 DIAGNOSIS — E119 Type 2 diabetes mellitus without complications: Secondary | ICD-10-CM | POA: Diagnosis not present

## 2015-04-30 DIAGNOSIS — L84 Corns and callosities: Secondary | ICD-10-CM | POA: Diagnosis not present

## 2015-04-30 DIAGNOSIS — R293 Abnormal posture: Secondary | ICD-10-CM | POA: Diagnosis not present

## 2015-04-30 DIAGNOSIS — M216X2 Other acquired deformities of left foot: Secondary | ICD-10-CM | POA: Diagnosis not present

## 2015-05-02 DIAGNOSIS — R2689 Other abnormalities of gait and mobility: Secondary | ICD-10-CM | POA: Diagnosis not present

## 2015-05-02 DIAGNOSIS — R293 Abnormal posture: Secondary | ICD-10-CM | POA: Diagnosis not present

## 2015-05-02 DIAGNOSIS — M545 Low back pain: Secondary | ICD-10-CM | POA: Diagnosis not present

## 2015-05-02 DIAGNOSIS — M6281 Muscle weakness (generalized): Secondary | ICD-10-CM | POA: Diagnosis not present

## 2015-05-05 DIAGNOSIS — M545 Low back pain: Secondary | ICD-10-CM | POA: Diagnosis not present

## 2015-05-05 DIAGNOSIS — R2689 Other abnormalities of gait and mobility: Secondary | ICD-10-CM | POA: Diagnosis not present

## 2015-05-05 DIAGNOSIS — R293 Abnormal posture: Secondary | ICD-10-CM | POA: Diagnosis not present

## 2015-05-05 DIAGNOSIS — M6281 Muscle weakness (generalized): Secondary | ICD-10-CM | POA: Diagnosis not present

## 2015-05-07 DIAGNOSIS — R2689 Other abnormalities of gait and mobility: Secondary | ICD-10-CM | POA: Diagnosis not present

## 2015-05-07 DIAGNOSIS — M6281 Muscle weakness (generalized): Secondary | ICD-10-CM | POA: Diagnosis not present

## 2015-05-07 DIAGNOSIS — R293 Abnormal posture: Secondary | ICD-10-CM | POA: Diagnosis not present

## 2015-05-07 DIAGNOSIS — M545 Low back pain: Secondary | ICD-10-CM | POA: Diagnosis not present

## 2015-05-12 DIAGNOSIS — R2689 Other abnormalities of gait and mobility: Secondary | ICD-10-CM | POA: Diagnosis not present

## 2015-05-12 DIAGNOSIS — M6281 Muscle weakness (generalized): Secondary | ICD-10-CM | POA: Diagnosis not present

## 2015-05-12 DIAGNOSIS — M545 Low back pain: Secondary | ICD-10-CM | POA: Diagnosis not present

## 2015-05-12 DIAGNOSIS — R293 Abnormal posture: Secondary | ICD-10-CM | POA: Diagnosis not present

## 2015-05-15 ENCOUNTER — Telehealth: Payer: Self-pay | Admitting: Internal Medicine

## 2015-05-15 NOTE — Telephone Encounter (Signed)
At the last visit we were discussing her foot pain. If she is having GI issues she should come in for visit and we can get her treated or referred if necessary.

## 2015-05-15 NOTE — Telephone Encounter (Signed)
Patient states that the last time she was in Dr. Sharlet Salina was going to refer her to GI.  I did not see a referral in the system.  Patient states she is having some really bad issues and needs to be seen as soon as possible.  Please follow up with patient in regards.

## 2015-05-15 NOTE — Telephone Encounter (Signed)
Please call patient and tell her that I spoke with Dr. Sharlet Salina and that a visit is required for a GI referral

## 2015-05-19 ENCOUNTER — Ambulatory Visit: Payer: Commercial Managed Care - HMO | Admitting: Internal Medicine

## 2015-05-27 ENCOUNTER — Encounter: Payer: Self-pay | Admitting: Internal Medicine

## 2015-05-27 ENCOUNTER — Other Ambulatory Visit: Payer: Commercial Managed Care - HMO

## 2015-05-27 ENCOUNTER — Ambulatory Visit (INDEPENDENT_AMBULATORY_CARE_PROVIDER_SITE_OTHER): Payer: Commercial Managed Care - HMO | Admitting: Internal Medicine

## 2015-05-27 VITALS — BP 160/80 | HR 72 | Temp 97.5°F | Resp 18 | Ht 67.0 in | Wt 232.0 lb

## 2015-05-27 DIAGNOSIS — R197 Diarrhea, unspecified: Secondary | ICD-10-CM

## 2015-05-27 NOTE — Patient Instructions (Signed)
We will check for the infection in the stool today and also get you in with GI upstairs.   If we get an answer then we can treat that to help with the diarrhea.  In the meantime you can try taking imodium to help with loose stools.

## 2015-05-27 NOTE — Progress Notes (Signed)
Pre visit review using our clinic review tool, if applicable. No additional management support is needed unless otherwise documented below in the visit note. 

## 2015-05-28 ENCOUNTER — Telehealth: Payer: Self-pay | Admitting: Internal Medicine

## 2015-05-28 LAB — GASTROINTESTINAL PATHOGEN PANEL PCR
C. DIFFICILE TOX A/B, PCR: NEGATIVE
CRYPTOSPORIDIUM, PCR: NEGATIVE
Campylobacter, PCR: NEGATIVE
E COLI (STEC) STX1/STX2, PCR: NEGATIVE
E COLI 0157, PCR: NEGATIVE
E coli (ETEC) LT/ST PCR: NEGATIVE
Giardia lamblia, PCR: NEGATIVE
Norovirus, PCR: NEGATIVE
ROTAVIRUS, PCR: NEGATIVE
SALMONELLA, PCR: NEGATIVE
Shigella, PCR: NEGATIVE

## 2015-05-28 LAB — CLOSTRIDIUM DIFFICILE BY PCR: Toxigenic C. Difficile by PCR: NOT DETECTED

## 2015-05-28 NOTE — Telephone Encounter (Signed)
Patient called in to advise that she caught a cough when she was here yesterday. She is asking that we call something in for her cough without her having to come in, since she was so recently seen.

## 2015-05-29 MED ORDER — BENZONATATE 100 MG PO CAPS: 100.0000 mg | ORAL_CAPSULE | Freq: Two times a day (BID) | ORAL | Status: DC | PRN

## 2015-05-29 NOTE — Telephone Encounter (Signed)
Sent in [place] to her pharmacy.

## 2015-05-29 NOTE — Telephone Encounter (Signed)
Left message informing patient.

## 2015-06-01 DIAGNOSIS — R197 Diarrhea, unspecified: Secondary | ICD-10-CM | POA: Insufficient documentation

## 2015-06-01 NOTE — Assessment & Plan Note (Signed)
Going on 2-3 weeks which is still considered acute. Checking stool panel for pathogens and C dif. Will refer to GI per her request. Advised she can continue using imodium and increase dosing as needed (decrease for constipation) prior to GI visit. Depending on results, treat as appropriate.

## 2015-06-01 NOTE — Progress Notes (Signed)
   Subjective:    Patient ID: [name], female    DOB: [birth year], 60 y.o.   MRN: [id number]  HPI The patient is a 60 YO female coming in for diarrhea. She is having some leaking and not able to make it to the bathroom. Multiple times per day. She has tried imodium which has not helped. She denies recent antibiotic exposure in the last 3 months. She does not reside at a SNF or ALF. She denies sick contacts or recent travel. Some abdominal crampy pain which is relieved by bowel movement. Some bloating which is causing her to not eat as much.   Review of Systems  Constitutional: Negative for fever, activity change, appetite change, fatigue and unexpected weight change.  Respiratory: Negative for cough, chest tightness, shortness of breath and wheezing.   Cardiovascular: Negative for chest pain, palpitations and leg swelling.  Gastrointestinal: Positive for abdominal pain, diarrhea and abdominal distention. Negative for nausea, vomiting, constipation and blood in stool.  Musculoskeletal: Positive for gait problem.  Skin: Negative.   Neurological: Positive for numbness. Negative for dizziness, syncope, weakness and headaches.       Chronic in her feet  Psychiatric/Behavioral: Negative.       Objective:   Physical Exam  Constitutional: She is oriented to person, place, and time. She appears well-developed and well-nourished.  HENT:  Head: Normocephalic and atraumatic.  Eyes: EOM are normal.  Neck: Normal range of motion.  Cardiovascular: Normal rate and regular rhythm.   Pulmonary/Chest: Effort normal.  Abdominal: Soft. Bowel sounds are normal. She exhibits no distension and no mass. There is tenderness. There is no rebound and no guarding.  No to minimal distention, normal BS, mild tenderness diffuse without rebound or guarding.   Musculoskeletal: She exhibits no edema.  Neurological: She is alert and oriented to person, place, and time. Coordination abnormal.  Slow gait  Skin:  Skin is warm and dry.   Filed Vitals:   05/27/15 1540  BP: 160/80  Pulse: 72  Temp: 97.5 F (36.4 C)  TempSrc: Oral  Resp: 18  Height: 5\' 7"  (1.702 m)  Weight: 232 lb (105.235 kg)  SpO2: 90%      Assessment & Plan:

## 2015-06-03 ENCOUNTER — Encounter: Payer: Self-pay | Admitting: Internal Medicine

## 2015-06-18 DIAGNOSIS — M5416 Radiculopathy, lumbar region: Secondary | ICD-10-CM | POA: Diagnosis not present

## 2015-06-18 DIAGNOSIS — M4316 Spondylolisthesis, lumbar region: Secondary | ICD-10-CM | POA: Diagnosis not present

## 2015-06-18 DIAGNOSIS — Z6837 Body mass index (BMI) 37.0-37.9, adult: Secondary | ICD-10-CM | POA: Diagnosis not present

## 2015-06-18 DIAGNOSIS — M4806 Spinal stenosis, lumbar region: Secondary | ICD-10-CM | POA: Diagnosis not present

## 2015-06-18 DIAGNOSIS — M5127 Other intervertebral disc displacement, lumbosacral region: Secondary | ICD-10-CM | POA: Diagnosis not present

## 2015-06-20 DIAGNOSIS — R293 Abnormal posture: Secondary | ICD-10-CM | POA: Diagnosis not present

## 2015-06-20 DIAGNOSIS — M545 Low back pain: Secondary | ICD-10-CM | POA: Diagnosis not present

## 2015-06-20 DIAGNOSIS — R2689 Other abnormalities of gait and mobility: Secondary | ICD-10-CM | POA: Diagnosis not present

## 2015-06-20 DIAGNOSIS — M6281 Muscle weakness (generalized): Secondary | ICD-10-CM | POA: Diagnosis not present

## 2015-07-03 ENCOUNTER — Ambulatory Visit (INDEPENDENT_AMBULATORY_CARE_PROVIDER_SITE_OTHER): Payer: Commercial Managed Care - HMO | Admitting: Physician Assistant

## 2015-07-03 ENCOUNTER — Ambulatory Visit: Payer: Commercial Managed Care - HMO | Admitting: Internal Medicine

## 2015-07-03 ENCOUNTER — Encounter: Payer: Self-pay | Admitting: Physician Assistant

## 2015-07-03 VITALS — BP 148/80 | HR 76 | Ht 67.0 in | Wt 238.0 lb

## 2015-07-03 DIAGNOSIS — N8184 Pelvic muscle wasting: Secondary | ICD-10-CM

## 2015-07-03 DIAGNOSIS — R159 Full incontinence of feces: Secondary | ICD-10-CM | POA: Diagnosis not present

## 2015-07-03 DIAGNOSIS — K648 Other hemorrhoids: Secondary | ICD-10-CM | POA: Diagnosis not present

## 2015-07-03 DIAGNOSIS — M6289 Other specified disorders of muscle: Secondary | ICD-10-CM

## 2015-07-03 DIAGNOSIS — K580 Irritable bowel syndrome with diarrhea: Secondary | ICD-10-CM | POA: Diagnosis not present

## 2015-07-03 MED ORDER — RIFAXIMIN 550 MG PO TABS: 550.0000 mg | ORAL_TABLET | Freq: Three times a day (TID) | ORAL | Status: DC

## 2015-07-03 MED ORDER — HYDROCORTISONE ACETATE 25 MG RE SUPP: 25.0000 mg | Freq: Two times a day (BID) | RECTAL | Status: DC

## 2015-07-03 NOTE — Patient Instructions (Signed)
We have sent your demographic information and a prescription for Xifaxan to [facility]. This pharmacy is able to get medication approved through insurance and get you the lowest copay possible. If you have not heard from them within 1 week, please call our office at [phone] to let us know.  Please purchase the following medications over the counter and take as directed: Benefiber 1 heaping tablespoon in 6 oz water twice daily.  Tucks wipes  Stop Miralax   We have sent the following medications to your pharmacy for you to pick up at your convenience: Anusol Suppositories 1 per rectum twice a day  We've sent a referral to Physical therapy at [facility], they will call you with an appointment.

## 2015-07-03 NOTE — Progress Notes (Signed)
Patient ID: [name], female   DOB: 11/17/1955, 60 y.o.   MRN: [id number]    HPI:  [name] is a 60 y.o.   female  referred by Hoyt Koch, * for evaluation of gas, and loose stools. E[place]ley states her stools have been somewhat erratic since she had a cholecystectomy several years ago. More recently over the past 6 months she has been excessively gassy and passes large amounts of foul-smelling flatulence. She typically is constipated and has been using Mira lax daily. She states she does not get the urge to move her bowels but over the past 6 months has been having fecal incontinence. She states when she does move her bowels they are like putting she has to wipe copiously after bowel movements and then has anal seepage. She will sometimes feel as if she has passed gas and then realized that she has passed mushy stool in her undergarments. She has been having trouble with urinary incontinence as well. She has had several episodes of scant bloody spotting on the toilet tissue after she cleans up after a bowel movement. She has had no blood dripping in the commode. She has not had rectal pain. She does have a sensation of incomplete evacuation.she has never been pregnant.  She has a history of diabetes, depression, fibromyalgia, bilateral DVTs, hypertension, and kidney stones. She also has a history of sleep apnea but since gastric bypass surgery years ago does not wear CPAP at. She states she had a colonoscopy by Dr. Collene Mares years ago for screening purposes and was told her prep was poor. She subsequently had a colonoscopy with Dr. Paulita Fujita 2 or 3 years ago. She says she has no polyps but due to her sister having had polyps she was advised to have surveillance in 5 years. She also has a history of ulcers in 2012 that were diagnosed by EGD by Dr. Paulita Fujita. He currently has no epigastric pain, nausea, or vomiting. Her appetite as been good and she states she's been gaining  weight.   Past Medical History  Diagnosis Date  . Depression   . Fibromyalgia   . Dysrhythmia     "heart tends to flutter"  . Sleep apnea     no cpap used in many yrs after weight lost-no machine now  . Heart murmur   . DVT (deep venous thrombosis) ([place]) 01-17-13    past hx. -tx.5-6 yrs ago bilateral legs, occ. sporadic swelling, has IVC filter implanted  . Anxiety     takes Citalopram daily  . Anemia     takes Ferrous Sulfate daily  . Insomnia     takes Melatonin daily  . Hypertension     takes Metoprolol daily  . Complication of anesthesia yrs ago    slow to wake up  . CHF (congestive heart failure) (HCC)     takes Lasix daily as needed  . Asthma     2004-prior to gastric bypass and no problems since  . Pneumonia 90's    hx of  . History of bronchitis 2012 or 2013  . Peripheral neuropathy ([place])   . Arthritis   . Joint pain   . Joint swelling   . Fracture     right foot and is in a cam boot  . Chronic back pain     spondylolisthesis/stenosis/radiculopathy  . GERD (gastroesophageal reflux disease)     hx of-no meds now  . History of colon polyps   . Urinary frequency   .  Urinary urgency   . Diabetes ([place])   . Gallstones     Past Surgical History  Procedure Laterality Date  . Gastric by-pass  2004  . Esophagogastroduodenoscopy (egd) with propofol  03/01/2012    Procedure: ESOPHAGOGASTRODUODENOSCOPY (EGD) WITH PROPOFOL;  Surgeon: Arta Silence, MD;  [facility];  Service: Endoscopy;  Laterality: N/A;  . Tonsillectomy      as child  . Dilation and curettage of uterus  yrs ago  . Hernia repair  2005  . Cholecystectomy  2007  . Ligament repair Right 1987    Rt. knee scope  . Insertion of vena cava filter  01-17-13    inserted 2004- "abdomen"  . Esophagogastroduodenoscopy (egd) with propofol N/A 01/31/2013    Procedure: ESOPHAGOGASTRODUODENOSCOPY (EGD) WITH PROPOFOL;  Surgeon: Arta Silence, MD;  [facility];  Service: Endoscopy;   Laterality: N/A;  . Balloon dilation N/A 01/31/2013    Procedure: BALLOON DILATION;  Surgeon: Arta Silence, MD;  [facility];  Service: Endoscopy;  Laterality: N/A;  . Colonoscopy    . Maximum access (mas)posterior lumbar interbody fusion (plif) 2 level N/A 06/07/2014    Procedure: L4-5 L5-S1 FOR MAXIMUM ACCESS (MAS) POSTERIOR LUMBAR INTERBODY FUSION ;  Surgeon: Erline Levine, MD;  Location: [place] [facility];  Service: Neurosurgery;  Laterality: N/A;  L4-5 L5-S1 FOR MAXIMUM ACCESS (MAS) POSTERIOR LUMBAR INTERBODY FUSION    Family History  Problem Relation Age of Onset  . Diabetes Mother   . Heart disease Father   . Hypertension Father   . Kidney disease Brother   . Colon cancer Neg Hx   . Colon polyps Neg Hx   . Stomach cancer Neg Hx   . Esophageal cancer Neg Hx   . Pancreatic cancer Neg Hx   . Liver disease Neg Hx    Social History  Substance Use Topics  . Smoking status: Former Smoker -- 0.50 packs/day for 10 years    Types: Cigarettes    Quit date: 04/19/2009  . Smokeless tobacco: Never Used  . Alcohol Use: 0.0 oz/week    0 Standard drinks or equivalent per week     Comment: occ. social- wine-1 drink monthly   Current Outpatient Prescriptions  Medication Sig Dispense Refill  . Acetaminophen 500 MG coapsule     . aspirin EC 81 MG tablet Take 81 mg by mouth daily.    Marland Kitchen BLACK COHOSH PO Take 1 tablet by mouth daily.    Marland Kitchen buPROPion (WELLBUTRIN SR) 150 MG 12 hr tablet Take 1 tablet (150 mg total) by mouth 2 (two) times daily. 60 tablet 6  . CALCIUM PO Take 1 tablet by mouth daily. 600mg     . CRANBERRY PO Take 1 tablet by mouth daily.    . cyanocobalamin (,VITAMIN B-12,) 1000 MCG/ML injection Inject 1 mL (1,000 mcg total) into the muscle once. 10 mL 0  . ferrous sulfate 325 (65 FE) MG tablet Take 325 mg by mouth daily.     . furosemide (LASIX) 40 MG tablet Take 1 tablet (40 mg total) by mouth daily as needed for edema. 90 tablet 3  . gabapentin (NEURONTIN) 400 MG capsule  Take one capsule by mouth four times daily 360 capsule 3  . MELATONIN PO Take 1 tablet by mouth at bedtime as needed. For sleep    . metoCLOPramide (REGLAN) 5 MG tablet Take 1 tablet (5 mg total) by mouth every 6 (six) hours as needed for nausea. 90 tablet 3  . metoprolol tartrate (LOPRESSOR)  25 MG tablet Take 0.5 tablets (12.5 mg total) by mouth 2 (two) times daily. 90 tablet 3  . Multiple Vitamins-Minerals (MULTIVITAMINS THER. W/MINERALS) TABS Take 1 tablet by mouth daily.     . nitroGLYCERIN (NITROSTAT) 0.4 MG SL tablet Place 0.4 mg under the tongue every 5 (five) minutes as needed for chest pain.     . polyethylene glycol (MIRALAX / GLYCOLAX) packet Take 17 g by mouth daily.    . potassium chloride SA (K-DUR,KLOR-CON) 20 MEQ tablet Take 1 tablet (20 mEq total) by mouth daily. 90 tablet 3  . pregabalin (LYRICA) 100 MG capsule Take 1 capsule (100 mg total) by mouth 2 (two) times daily. 60 capsule 6  . promethazine (PHENERGAN) 25 MG tablet Take 25 mg by mouth every 6 (six) hours as needed for nausea.    Marland Kitchen SYRINGE-NEEDLE, DISP, 3 ML 25G X 5/8" 3 ML MISC Inject 69mL once weekly for 4 weeks and then once monthly 50 each 0  . traMADol (ULTRAM) 50 MG tablet Take 1 tablet (50 mg total) by mouth 4 (four) times daily. 120 tablet 0  . triamcinolone cream (KENALOG) 0.5 % Apply 1 application topically 2 (two) times daily. To affected areas. 30 g 3  . vitamin C (ASCORBIC ACID) 500 MG tablet Take 500 mg by mouth daily.    Marland Kitchen VITAMIN D, CHOLECALCIFEROL, PO Take 1,200 Units by mouth daily.    . hydrocortisone (ANUSOL-HC) 25 MG suppository Place 1 suppository (25 mg total) rectally every 12 (twelve) hours. 20 suppository 1   No current facility-administered medications for this visit.   Allergies  Allergen Reactions  . Carafate [Sucralfate] Hives    Other reaction(s): Angioedema (ALLERGY/intolerance) hives scratching  . Sulfonamide Derivatives Rash    REACTION: Syncope     Review of Systems: Gen:  Denies any fever, chills, sweats, anorexia, fatigue, weakness, malaise, weight loss, and sleep disorder CV: Denies chest pain, angina, palpitations, syncope, orthopnea, PND, peripheral edema, and claudication. Resp: Denies dyspnea at rest, dyspnea with exercise, cough, sputum, wheezing, coughing up blood, and pleurisy. GI: Denies vomiting blood, jaundice, and fecal incontinence.   Denies dysphagia or odynophagia. GU : Denies urinary burning, blood in urine, urinary frequency, urinary hesitancy, nocturnal urination, and urinary incontinence. MS: Denies joint pain, limitation of movement, and swelling, stiffness, low back pain, extremity pain. Denies muscle weakness, cramps, atrophy.  Derm: Denies rash, itching, dry skin, hives, moles, warts, or unhealing ulcers.  Psych: Denies depression, anxiety, memory loss, suicidal ideation, hallucinations, paranoia, and confusion. Heme: Denies bruising, bleeding, and enlarged lymph nodes. Neuro:  Denies any headaches, dizziness, paresthesias.     LAB RESULTS: Stool for C. Difficile 05/27/2015 was negative. Gastrointestinal pathogen panel 05/27/2015 was negative.    Physical Exam: BP 148/80 mmHg  Pulse 76  Ht 5\' 7"  (1.702 m)  Wt 238 lb (107.956 kg)  BMI 37.27 kg/m2 Constitutional: Pleasant,well-developed,female in no acute distress. HEENT: Normocephalic and atraumatic. Conjunctivae are normal. No scleral icterus. Neck supple. No JVD Cardiovascular: Normal rate, regular rhythm.  Pulmonary/chest: Effort normal and breath sounds normal. No wheezing, rales or rhonchi. Abdominal: Soft, nondistended, nontender. Bowel sounds active throughout. There are no masses palpable. No hepatomegaly. RECTAL:large amount of brown stool between cheeks of buttocks that patient did not realize was fair. No external hemorrhoids noted. Brown stool, Hemoccult negative. Internal hemorrhoids noted. Poor sphincter tone. Extremities: no edema Lymphadenopathy: No cervical  adenopathy noted. Neurological: Alert and oriented to person place and time. Skin: Skin is warm and dry. No  rashes noted. Psychiatric: Normal mood and affect. Behavior is normal.  ASSESSMENT AND PLAN: 60 year old female with a 6 month history of gas, soft stools, and fecal incontinence referred for evaluation. She's been instructed to stop her Mira lax. She will try Benefiber heaping tablespoon twice daily to try to bulk her stool. She will be given an empiric trial of Xifaxan 550 mg 3 times daily for 14 days for possible small intestinal bacterial overgrowth. She will be referred to Earlie Counts of physical therapy for evaluation and treatment of pelvic floor dysfunction. She will return in 2 months, sooner if needed.she has signed a medical release to obtain previous GI records from Dr. Collene Mares, Dr. Paulita Fujita, and Dr. Patsey Berthold who is a gastroenterologist that she has also seen in [place].    Latissa Frick, Vita Barley PA-C 07/03/2015, 3:35 PM  CC: Hoyt Koch, *

## 2015-07-04 NOTE — Progress Notes (Signed)
Agree w/ Ms. Hvozdovic's note and mangement.  

## 2015-07-08 ENCOUNTER — Telehealth: Payer: Self-pay

## 2015-07-08 NOTE — Telephone Encounter (Signed)
Received fax from [facility] stating patient's [name] has been denied. Dr. Carlean Purl, please advise if you want patient to do something differently or prescribe something else.

## 2015-07-11 NOTE — Telephone Encounter (Signed)
Please get an update on:  1) frequency of bowel movements 2) consistency of stools 3) Is she still leaking/seeping? 4) Is she still gassy?  Will get back next week

## 2015-07-11 NOTE — Telephone Encounter (Signed)
Patient reports around 2 loose stools daily either mushy or loose-watery stools. Patient denies blood in her stools. Patient states she has to wear a depend because she has fecal leakage. Patient is also very gassy and nauseated in the morning and at night. Patient states she has no appetite. Please advise.

## 2015-07-14 ENCOUNTER — Other Ambulatory Visit: Payer: Self-pay

## 2015-07-14 ENCOUNTER — Ambulatory Visit: Payer: Commercial Managed Care - HMO | Attending: Physician Assistant | Admitting: Physical Therapy

## 2015-07-14 ENCOUNTER — Encounter: Payer: Self-pay | Admitting: Physical Therapy

## 2015-07-14 ENCOUNTER — Other Ambulatory Visit: Payer: Self-pay | Admitting: Internal Medicine

## 2015-07-14 DIAGNOSIS — R197 Diarrhea, unspecified: Secondary | ICD-10-CM

## 2015-07-14 DIAGNOSIS — M6281 Muscle weakness (generalized): Secondary | ICD-10-CM | POA: Diagnosis not present

## 2015-07-14 DIAGNOSIS — Z1231 Encounter for screening mammogram for malignant neoplasm of breast: Secondary | ICD-10-CM

## 2015-07-14 DIAGNOSIS — R269 Unspecified abnormalities of gait and mobility: Secondary | ICD-10-CM

## 2015-07-14 DIAGNOSIS — R159 Full incontinence of feces: Secondary | ICD-10-CM | POA: Diagnosis not present

## 2015-07-14 MED ORDER — METRONIDAZOLE 250 MG PO TABS: 250.0000 mg | ORAL_TABLET | Freq: Three times a day (TID) | ORAL | Status: DC

## 2015-07-14 NOTE — Telephone Encounter (Signed)
OK  Let her know I sent in an Rx for metronidazole as a substitute - [facility] [address]  Hopefully that and pelvic floor PT will help  I am seeing her 5/16 also

## 2015-07-14 NOTE — Telephone Encounter (Signed)
Informed patient that metronidazole has been sent to her pharmacy and to continue with PT. Reminded patient of follow-up with Dr. Carlean Purl. Patient verbalized understanding.

## 2015-07-14 NOTE — Therapy (Signed)
[facility] [address] [place], [place], [address] Phone: [phone]   Fax:  [phone]  Physical Therapy Evaluation  Patient Details  Name: [name] MRN: [id number] Date of Birth: 28-Dec-1962 Referring Provider: Knox Saliva  Encounter Date: 07/14/2015      PT End of Session - 07/14/15 1614    Visit Number 1   Number of Visits 10  Medicare   Date for PT Re-Evaluation 09/08/15   Authorization Type [place] [employer]   PT Start Time [id number]   PT Stop Time 1610   PT Time Calculation (min) 40 min   Activity Tolerance Patient tolerated treatment well   Behavior During Therapy [facility] for tasks assessed/performed      Past Medical History  Diagnosis Date  . Depression   . Fibromyalgia   . Dysrhythmia     "heart tends to flutter"  . Sleep apnea     no cpap used in many yrs after weight lost-no machine now  . Heart murmur   . DVT (deep venous thrombosis) ([place]) 01-17-13    past hx. -tx.5-6 yrs ago bilateral legs, occ. sporadic swelling, has IVC filter implanted  . Anxiety     takes Citalopram daily  . Anemia     takes Ferrous Sulfate daily  . Insomnia     takes Melatonin daily  . Hypertension     takes Metoprolol daily  . Complication of anesthesia yrs ago    slow to wake up  . CHF (congestive heart failure) (HCC)     takes Lasix daily as needed  . Asthma     60-prior to gastric bypass and no problems since  . Pneumonia 90's    hx of  . History of bronchitis 2012 or 2013  . Peripheral neuropathy ([place])   . Arthritis   . Joint pain   . Joint swelling   . Fracture     right foot and is in a cam boot  . Chronic back pain     spondylolisthesis/stenosis/radiculopathy  . GERD (gastroesophageal reflux disease)     hx of-no meds now  . History of colon polyps   . Urinary frequency   . Urinary urgency   . Diabetes ([place])   . Gallstones     Past Surgical History  Procedure Laterality Date   . Gastric by-pass  2004  . Esophagogastroduodenoscopy (egd) with propofol  03/01/2012    Procedure: ESOPHAGOGASTRODUODENOSCOPY (EGD) WITH PROPOFOL;  Surgeon: Arta Silence, MD;  [facility];  Service: Endoscopy;  Laterality: N/A;  . Tonsillectomy      as child  . Dilation and curettage of uterus  60 yrs ago  . Hernia repair  2005  . Cholecystectomy  2007  . Ligament repair Right 1987    Rt. knee scope  . Insertion of vena cava filter  01-17-13    inserted 2004- "abdomen"  . Esophagogastroduodenoscopy (egd) with propofol N/A 01/31/2013    Procedure: ESOPHAGOGASTRODUODENOSCOPY (EGD) WITH PROPOFOL;  Surgeon: Arta Silence, MD;  [facility];  Service: Endoscopy;  Laterality: N/A;  . Balloon dilation N/A 01/31/2013    Procedure: BALLOON DILATION;  Surgeon: Arta Silence, MD;  [facility];  Service: Endoscopy;  Laterality: N/A;  . Colonoscopy    . Maximum access (mas)posterior lumbar interbody fusion (plif) 2 level N/A 06/07/2014    Procedure: L4-5 L5-S1 FOR MAXIMUM ACCESS (MAS) POSTERIOR LUMBAR INTERBODY FUSION ;  Surgeon: Erline Levine, MD;  Location: [place] [facility];  Service:  Neurosurgery;  Laterality: N/A;  L4-5 L5-S1 FOR MAXIMUM ACCESS (MAS) POSTERIOR LUMBAR INTERBODY FUSION     There were no vitals filed for this visit.  Visit Diagnosis:  Muscle weakness (generalized) - Plan: PT plan of care cert/re-cert  Fecal incontinence - Plan: PT plan of care cert/re-cert  Gait abnormality - Plan: PT plan of care cert/re-cert      Subjective Assessment - 07/14/15 1538    Subjective After back surgery things got worse. When I have the urge to have a bowel movement and get up, the bowels just come out.  Patient wears 4 pads per day. When patient passes gas she has a bowel leakage.   Urinary incontinence.    Patient Stated Goals make it to the bathroom without bowel leakage, reduce urinary leakage   Currently in Pain? No/denies            [facility] PT Assessment -  07/14/15 0001    Assessment   Medical Diagnosis N81.84 Pelvic floor dysfunction; R15.9 Fecal incontinence; K64.8 internal hemorrhoid; K58.0 Irritable bowel syndrome with diarrhea   Referring Provider Hvozdzdovic, Lori   Onset Date/Surgical Date 05/22/15   Prior Therapy None   Precautions   Precautions None   Balance Screen   Has the patient fallen in the past 6 months Yes  walking down hill on wet grass while walking dog and he pull   How many times? 1   Has the patient had a decrease in activity level because of a fear of falling?  No   Is the patient reluctant to leave their home because of a fear of falling?  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 hip flexion PROM 90 degrees   Strength   Overall Strength Comments abdominal strenth 2/5   Right Hip Flexion 4/5   Right Hip Extension 3+/5   Right Hip ABduction 4/5   Left Hip Flexion 4/5   Left Hip Extension 3+/5   Left Hip ABduction 4/5   Ambulation/Gait   Assistive device None   Gait Pattern Decreased dorsiflexion - right;Decreased dorsiflexion - left;Decreased step length - left;Decreased step length - right   Standardized Balance Assessment   Five times sit to stand comments  19 sec   Timed Up and Go Test   TUG Normal TUG   Normal TUG (seconds) 15   TUG Comments High risk of falls                 Pelvic Floor Special Questions - 07/14/15 0001    Are you Pregnant or attempting pregnancy? No   Prior Pregnancies No   Currently Sexually Active No   Urinary Leakage Yes    Pad use 4   Activities that cause leaking With strong urge;Sneezing;Coughing;Laughing  sit to stand with urine and bowels   Urinary urgency Yes   Urinary frequency 6   Fecal incontinence Yes   Fluid intake 2 glasses of water   Caffeine beverages 3   Falling out  feeling (prolapse) No   Pelvic Floor Internal Exam Patient confirms identification and approves physical therapist to assess anal muscle strength and integrity   Exam Type Rectal   Palpation needs tactle cues to contract and not contract gluteal   Strength Flicker   Strength # of reps 3   Strength # of seconds 5   Tone hypotonicity                  PT Education - 07/14/15 1620    Education provided Yes   Education Details pelvic floor exercise in sidely   Person(s) Educated Patient   Methods Explanation;Demonstration;Verbal cues;Handout   Comprehension Returned demonstration;Verbalized understanding          PT Short Term Goals - 07/14/15 1615    PT SHORT TERM GOAL #1   Title understand what bladder irritants are   Time 4   Period Weeks   Status New   PT SHORT TERM GOAL #2   Title understand correct toileting technique   Time 4   Period Weeks   Status New   PT SHORT TERM GOAL #3   Title fecal incontience decreased >/= 25%   Time 4   Period Weeks   Status New   PT SHORT TERM GOAL #4   Title timed up and go score </= 13 seconds   Time 4   Period Weeks   Status New           PT Long Term Goals - 07/14/15 1616    PT LONG TERM GOAL #1   Title independent with HEP and how to progress herself   Time 8   Period Weeks   Status New   PT LONG TERM GOAL #2   Title use </= 1 pad per day due to reduction of incontinence >/= 70%   Time 8   Period Weeks   Status New   PT LONG TERM GOAL #3   Title sit to stand 5 tims </= 12 seconds due to improved balance   Time 8   Period Weeks   Status New   PT LONG TERM GOAL #4   Title able to pass gas with fecal incontinence decreased >/= 70%   Time 8    Period Weeks   Status New   PT LONG TERM GOAL #5   Title pelvic floor strength >/= 3/5   Time 8   Period Weeks   Status New               Plan - 07/14/15 1607    Clinical Impression Statement Patient is a 60 year old female with diagnosis of fecal incontinence  since 05/21/2014.  Patient reports no pain.  Patient reports she wears 5 pads per day  due to fecal and urinary incontinence.  Patient will leak feces with passing gas and when she get up from a  chair  during the urge to go .  Patient will leak urine with urge to go, getting up from a chair, laughing, coughing, and sneezing.  Pelvic floor strength is 1/5 with low tone.  Bilateral hip strength is 3+/5.  abdomoinal strength is 2/5.  Bilateral hip flexion PROM is 90 degrees.  Timed up and go is 15 sec and does 5 sit to stand in 19 seconds indicating a high risk of falls.  Patient fell yesterday when she was walking her dog on wet grass and he pulled her.  When patient walks she has decresed heel strike and decresaed  bil. hip extension.  Patient will benefit from physical therapy to incresae pelvic floor strength to reduce incontinence and improve balance.    Pt will benefit from skilled therapeutic intervention in order to improve on the following deficits Abnormal gait;Decreased mobility;Decreased balance;Decreased strength;Decreased range of motion;Decreased endurance   Rehab Potential Excellent   Clinical Impairments Affecting Rehab Potential None   PT Frequency 1x / week   PT Duration 8 weeks   PT Treatment/Interventions Biofeedback;Advice worker;Therapeutic activities;Therapeutic exercise;Balance training;Patient/family education;Manual techniques;Energy conservation;Passive range of motion   PT Next Visit Plan pelvic floor strength with ball squeeze, things that irritate the bladder, bowel health, hip strength and stretches, balance exercises   PT Home Exercise Plan hip stretches   Recommended Other  Services None   Consulted and Agree with Plan of Care Patient          G-Codes - 2015-08-09 1620    Functional Assessment Tool Used FOTO score is 92% limitation  goal is 75% limitation   Functional Limitation Other PT primary   Other PT Primary Current Status [id number]) At least 80 percent but less than 100 percent impaired, limited or restricted   Other PT Primary Goal Status [id number]) At least 60 percent but less than 80 percent impaired, limited or restricted       Problem List Patient Active Problem List   Diagnosis Date Noted  . Diarrhea 06/01/2015  . Lumbar radiculopathy 03/05/2015  . Abdominal bloating 02/02/2015  . Stress fracture of foot 01/28/2015  . Peripheral neuropathy ([place]) 01/17/2015  . Foot callus 01/16/2015  . Cystitis 12/14/2014  . Postoperative anemia due to acute blood loss 08/08/2014  . Lumbosacral spondylosis without myelopathy 08/10/2013  . Generalized pain 02/21/2011  . Fibromyalgia 02/21/2011  . Essential hypertension 12/04/2006  . Hx of diabetes mellitus 10/20/2006  . Morbid obesity ([place]) 10/20/2006  . ANXIETY 10/20/2006  . MDD (major depressive disorder) ([place]) 10/20/2006  . OBSTRUCTIVE SLEEP APNEA 10/20/2006  . OSTEOARTHRITIS 10/20/2006  . DVT, HX OF 10/20/2006    Earlie Counts, PT 08/09/15 4:24 PM   [facility] [address], [place], [place], [address] Phone: [phone]   Fax:  [phone]  Name: POLETH WALSON MRN: [id number] Date of Birth: 04-20-1955

## 2015-07-14 NOTE — Patient Instructions (Signed)
Slow Contraction: Gravity Eliminated (Side-Lying)    Lie on left side, hips and knees slightly bent. Slowly squeeze pelvic floor for _5__ seconds. Rest for __5_ seconds. Repeat _5__ times. Do _4__ times a day.   Copyright  VHI. All rights reserved.  [facility] [address], [address] [place], [place] [address] Phone # [phone] Fax [phone]   

## 2015-07-21 ENCOUNTER — Telehealth: Payer: Self-pay | Admitting: Internal Medicine

## 2015-07-21 NOTE — Telephone Encounter (Signed)
Spoke with Amy and told her we sent in a xifaxan replacement since the xifaxan was denied.

## 2015-07-23 ENCOUNTER — Ambulatory Visit: Payer: Commercial Managed Care - HMO | Attending: Physician Assistant | Admitting: Physical Therapy

## 2015-07-23 DIAGNOSIS — M6281 Muscle weakness (generalized): Secondary | ICD-10-CM | POA: Insufficient documentation

## 2015-07-24 ENCOUNTER — Ambulatory Visit: Payer: Commercial Managed Care - HMO

## 2015-07-30 ENCOUNTER — Ambulatory Visit: Payer: Commercial Managed Care - HMO | Admitting: Physical Therapy

## 2015-07-31 ENCOUNTER — Ambulatory Visit: Payer: Commercial Managed Care - HMO | Admitting: Physical Therapy

## 2015-08-04 ENCOUNTER — Encounter: Payer: Commercial Managed Care - HMO | Admitting: Physical Therapy

## 2015-08-06 ENCOUNTER — Ambulatory Visit: Payer: Commercial Managed Care - HMO | Admitting: Physical Therapy

## 2015-08-13 ENCOUNTER — Other Ambulatory Visit: Payer: Self-pay | Admitting: Internal Medicine

## 2015-08-13 ENCOUNTER — Ambulatory Visit: Payer: Commercial Managed Care - HMO | Admitting: Physical Therapy

## 2015-08-13 DIAGNOSIS — M6281 Muscle weakness (generalized): Secondary | ICD-10-CM | POA: Diagnosis not present

## 2015-08-13 NOTE — Patient Instructions (Signed)
The Digestive System  How we get nutrition and how we eliminate waste is usually taken for granted until there is a problem with the system. The digestive system begins at the mouth and ends at the anus. Here is an overview of the parts that make up the digestive system and how each works within this complex system.  Mouth . ingests food/fluid . breaks food down by chewing . begins digestion by adding saliva  Esophagus . this tube carries food/fluid to stomach   Stomach . breaks down and mixes up food . holds food until the bowel is ready for it (up to two hours)  . can hold one liter of food  and fluid (we feel full with 1/2 liter)   Gastrocolic Reflex . initiates peristaltic waves of digested food onward to rectum 3-6 times a day via the pyloric sphincter   Small Intestines (Bowel) . has the duodenum, where most digestion takes place because of added secretions from the pancreas, gallbladder, liver, jejunum, then ilium . takes 4-6 hours for food to go through this section  Colon (Large Intestine) . receives 1000-1500 ml of digested food, which is now a thick liquid, per day  . Has three parts: ascending colon, transverse colon and the descending colon . Transit time through the colon from the ascending colon, transverse colon, and descending colon to the sigmoid (storage) is 24-72 hours depending on what has been eaten; fatty meals take longer . passes along hardened feces to the rectum  Rectum . Once stool enters into the rectum we become aware that we need to have a bowel movement.  Introduction to Bowel Health Diet and daily habits can help you predict when your bowels will move on a regular basis.  The consistency and quantity of the stool is usually more important than the frequency.  The goal is to have a regular bowel movement that is soft but formed.   Tips on Emptying Regularly . Eat breakfast.  Usually the best time of day for a bowel movement will be a half hour to  an hour after eating.  These times are best because the body uses the gastrocolic reflex, a stimulation of bowel motion that occurs with eating, to help produce a bowel movement.  For some people even a simple hot drink in the morning can help the reflex action begin. . Eat all your meals at a predictable time each day.  The bowel functions best when food is introduced at the same regular intervals. . The amount of food eaten at a given time of day should be about the same size from day to day.  The bowel functions best when food is introduced in similar quantities from day to day. It is fine to have a small breakfast and a large lunch, or vice versa, just be consistent. . Eat two servings of fruit or vegetables and at least one serving of a complex carbohydrates (whole grains such as brown rice, bran, whole wheat bread, or oatmeal) at each meal. . Drink plenty of water-ideally eight glasses a day.  Be sure to increase your water intake if you are increasing fiber into your diet.  Maintain Healthy Habits . Exercise daily.  You may exercise at any time of day, but you may find that bowel function is helped most if the exercise is at a consistent time each day. . Make sure that you are not rushed and have convenient access to a bathroom at your selected time to empty  your bowels.      Certain foods and liquids will decrease the pH making the urine more acidic.  Urinary urgency increases when the urine has a low pH.  Most common irritants: alcohol, carbonated beverages and caffinated beverages. Reduce the amount of coffee you drink.   Foods to avoid: apple juice, apples, ascorbic acid, canteloupes, chili, citrus fruits, coffee, cranberries, grapes, guava, peaches, pepper, pineapple, plums, strawberries, tea, tomatoes, and vinegar.  Drinking plenty of water may help to increase the pH and dilute out any of the effects of specific irritants.  Foods that are NOT irritating to the bladder  include: Pears, papayas, sun-brewed teas, watermelons, non-citrus herbal teas, apricots, kava and low-acid instant drinks (Postum)  About Abdominal Massage  Abdominal massage, also called external colon massage, is a self-treatment circular massage technique that can reduce and eliminate gas and ease constipation. The colon naturally contracts in waves in a clockwise direction starting from inside the right hip, moving up toward the ribs, across the belly, and down inside the left hip.  When you perform circular abdominal massage, you help stimulate your colon's normal wave pattern of movement called peristalsis.  It is most beneficial when done after eating.  Positioning You can practice abdominal massage with oil while lying down, or in the shower with soap.  Some people find that it is just as effective to do the massage through clothing while sitting or standing.  How to Massage Start by placing your finger tips or knuckles on your right side, just inside your hip bone.  . Make small circular movements while you move upward toward your rib cage.   . Once you reach the bottom right side of your rib cage, take your circular movements across to the left side of the bottom of your rib cage.  . Next, move downward until you reach the inside of your left hip bone.  This is the path your feces travel in your colon. . Continue to perform your abdominal massage in this pattern for 10 minutes each day.     You can apply as much pressure as is comfortable in your massage.  Start gently and build pressure as you continue to practice.  Notice any areas of pain as you massage; areas of slight pain may be relieved as you massage, but if you have areas of significant or intense pain, consult with your healthcare provider.  Other Considerations . General physical activity including bending and stretching can have a beneficial massage-like effect on the colon.  Deep breathing can also stimulate the colon  because breathing deeply activates the same nervous system that supplies the colon.   . Abdominal massage should always be used in combination with a bowel-conscious diet that is high in the proper type of fiber for you, fluids (primarily water), and a regular exercise program.   [facility] [address], [place], [place] [address] Phone # [phone] Fax [phone]

## 2015-08-13 NOTE — Therapy (Signed)
[facility] [address], [place], [place], [address] Phone: [phone]   Fax:  [phone]  Physical Therapy Treatment  Patient Details  Name: [name] MRN: [id number] Date of Birth: 18-Oct-1955 Referring Provider: Knox Saliva  Encounter Date: 08/13/2015      PT End of Session - 08/13/15 1314    Visit Number 2   Number of Visits 10  medicare   Date for PT Re-Evaluation 09/08/15   Authorization Type [employer]   PT Start Time 1230   PT Stop Time 0160   PT Time Calculation (min) 43 min   Activity Tolerance Patient tolerated treatment well   Behavior During Therapy [facility] for tasks assessed/performed      Past Medical History  Diagnosis Date  . Depression   . Fibromyalgia   . Dysrhythmia     "heart tends to flutter"  . Sleep apnea     no cpap used in many yrs after weight lost-no machine now  . Heart murmur   . DVT (deep venous thrombosis) ([place]) 01-17-13    past hx. -tx.5-6 yrs ago bilateral legs, occ. sporadic swelling, has IVC filter implanted  . Anxiety     takes Citalopram daily  . Anemia     takes Ferrous Sulfate daily  . Insomnia     takes Melatonin daily  . Hypertension     takes Metoprolol daily  . Complication of anesthesia yrs ago    slow to wake up  . CHF (congestive heart failure) (HCC)     takes Lasix daily as needed  . Asthma     2004-prior to gastric bypass and no problems since  . Pneumonia 90's    hx of  . History of bronchitis 2012 or 2013  . Peripheral neuropathy ([place])   . Arthritis   . Joint pain   . Joint swelling   . Fracture     right foot and is in a cam boot  . Chronic back pain     spondylolisthesis/stenosis/radiculopathy  . GERD (gastroesophageal reflux disease)     hx of-no meds now  . History of colon polyps   . Urinary frequency   . Urinary urgency   . Diabetes ([place])   . Gallstones     Past Surgical History  Procedure Laterality Date   . Gastric by-pass  2004  . Esophagogastroduodenoscopy (egd) with propofol  03/01/2012    Procedure: ESOPHAGOGASTRODUODENOSCOPY (EGD) WITH PROPOFOL;  Surgeon: Arta Silence, MD;  Location: [facility];  Service: Endoscopy;  Laterality: N/A;  . Tonsillectomy      as child  . Dilation and curettage of uterus  yrs ago  . Hernia repair  2005  . Cholecystectomy  2007  . Ligament repair Right 1987    Rt. knee scope  . Insertion of vena cava filter  01-17-13    inserted 2004- "abdomen"  . Esophagogastroduodenoscopy (egd) with propofol N/A 01/31/2013    Procedure: ESOPHAGOGASTRODUODENOSCOPY (EGD) WITH PROPOFOL;  Surgeon: Arta Silence, MD;  [facility];  Service: Endoscopy;  Laterality: N/A;  . Balloon dilation N/A 01/31/2013    Procedure: BALLOON DILATION;  Surgeon: Arta Silence, MD;  [facility]: [facility];  Service: Endoscopy;  Laterality: N/A;  . Colonoscopy    . Maximum access (mas)posterior lumbar interbody fusion (plif) 2 level N/A 06/07/2014    Procedure: L4-5 L5-S1 FOR MAXIMUM ACCESS (MAS) POSTERIOR LUMBAR INTERBODY FUSION ;  Surgeon: Erline Levine, MD;  Location: [facility];  Service:  Neurosurgery;  Laterality: N/A;  L4-5 L5-S1 FOR MAXIMUM ACCESS (MAS) POSTERIOR LUMBAR INTERBODY FUSION     There were no vitals filed for this visit.                       [facility] Adult PT Treatment/Exercise - 08/13/15 0001    Self-Care   Self-Care Other Self-Care Comments  diet, bladder irritants, dump syndrome   Other Self-Care Comments  diet, bladder irritants, dump syndorme   Therapeutic Activites    Therapeutic Activities Other Therapeutic Activities   Other Therapeutic Activities walking with pelvic floor contraction to hold back bowels   Manual Therapy   Manual Therapy Soft tissue mobilization;Manual Lymphatic Drainage (MLD)   Soft tissue mobilization scar on abdomen and soft tissue work to promote large intestines bowel movement   Manual Lymphatic Drainage  (MLD) abdomen to reduce swelling                PT Education - 08/13/15 1306    Education provided Yes   Education Details abdominal massage, bowel health, bladder irritants, discussion of diet and dump syndrome with gastric bypass, contracting pelvic floor while walking to the bathroom.   Person(s) Educated Patient   Methods Explanation;Demonstration;Verbal cues;Handout   Comprehension Returned demonstration;Verbalized understanding          PT Short Term Goals - 08/13/15 1318    PT SHORT TERM GOAL #1   Title understand what bladder irritants are   Time 4   Period Weeks   Status On-going   PT SHORT TERM GOAL #2   Title understand correct toileting technique   Time 4   Period Weeks   Status On-going   PT SHORT TERM GOAL #3   Title fecal incontience decreased >/= 25%   Time 4   Period Weeks   Status On-going   PT SHORT TERM GOAL #4   Title timed up and go score </= 13 seconds   Time 4   Period Weeks   Status On-going           PT Long Term Goals - 07/14/15 1616    PT LONG TERM GOAL #1   Title independent with HEP and how to progress herself   Time 8   Period Weeks   Status New   PT LONG TERM GOAL #2   Title use </= 1 pad per day due to reduction of incontinence >/= 70%   Time 8   Period Weeks   Status New   PT LONG TERM GOAL #3   Title sit to stand 5 tims </= 12 seconds due to improved balance   Time 8   Period Weeks   Status New   PT LONG TERM GOAL #4   Title able to pass gas with fecal incontinence decreased >/= 70%   Time 8   Period Weeks   Status New   PT LONG TERM GOAL #5   Title pelvic floor strength >/= 3/5   Time 8   Period Weeks   Status New               Plan - 08/13/15 1315    Clinical Impression Statement Patient is a 60 year old female with diagnosis of fecal incontinence since 05/21/2014. Pateint is having issues with fecal incontinence and is not going out due to accidents.  Patient reports she passes 40% less gas.   Patient has not met goals due to just starting therapy.  Patient diaphgram  is not moving correctly due to tightness. Patient will benefit from physical therapy to improve strength and mobility.    Rehab Potential Excellent   Clinical Impairments Affecting Rehab Potential None   PT Frequency 1x / week   PT Duration 8 weeks   PT Treatment/Interventions Biofeedback;Advice worker;Therapeutic activities;Therapeutic exercise;Balance training;Patient/family education;Manual techniques;Energy conservation;Passive range of motion   PT Next Visit Plan pelvic floor strength with ball squeeze,hip strength and stretches, balance exercises   PT Home Exercise Plan hip stretches   Recommended Other Services None   Consulted and Agree with Plan of Care Patient      Patient will benefit from skilled therapeutic intervention in order to improve the following deficits and impairments:  Abnormal gait, Decreased mobility, Decreased balance, Decreased strength, Decreased range of motion, Decreased endurance  Visit Diagnosis: Muscle weakness (generalized)     Problem List Patient Active Problem List   Diagnosis Date Noted  . Diarrhea 06/01/2015  . Lumbar radiculopathy 03/05/2015  . Abdominal bloating 02/02/2015  . Stress fracture of foot 01/28/2015  . Peripheral neuropathy ([place]) 01/17/2015  . Foot callus 01/16/2015  . Cystitis 12/14/2014  . Postoperative anemia due to acute blood loss 08/08/2014  . Lumbosacral spondylosis without myelopathy 08/10/2013  . Generalized pain 02/21/2011  . Fibromyalgia 02/21/2011  . Essential hypertension 12/04/2006  . Hx of diabetes mellitus 10/20/2006  . Morbid obesity ([place]) 10/20/2006  . ANXIETY 10/20/2006  . MDD (major depressive disorder) ([place]) 10/20/2006  . OBSTRUCTIVE SLEEP APNEA 10/20/2006  . OSTEOARTHRITIS 10/20/2006  . DVT, HX OF 10/20/2006    Earlie Counts, PT 08/13/2015 1:20 PM   [facility] [address] [place], [place], [address] Phone: [phone]   Fax:  [phone]  Name: [name] MRN: [id number] Date of Birth: [birth year]

## 2015-08-20 ENCOUNTER — Encounter: Payer: Self-pay | Admitting: Physical Therapy

## 2015-08-20 ENCOUNTER — Ambulatory Visit: Payer: Commercial Managed Care - HMO | Attending: Physician Assistant | Admitting: Physical Therapy

## 2015-08-20 DIAGNOSIS — M6281 Muscle weakness (generalized): Secondary | ICD-10-CM | POA: Diagnosis not present

## 2015-08-20 DIAGNOSIS — R159 Full incontinence of feces: Secondary | ICD-10-CM | POA: Diagnosis not present

## 2015-08-20 NOTE — Patient Instructions (Signed)
Isometric Hold (Hook-Lying)    Lie with hips and knees bent. Slowly inhale, and then exhale. Pull navel toward spine and Hold for _5__ seconds. Continue to breathe in and out during hold. Rest for _5__ seconds. Repeat _5__ times. Do _2__ times a day.   Copyright  VHI. All rights reserved.  Isometric Hold (Sitting)    Sit upright. Slowly inhale, and then exhale. Pull navel toward spine and Hold for __5_ seconds. Continue to breathe in and out during hold. Rest for _5__ seconds. Repeat _5__ times. Do _2__ times a day.  Copyright  VHI. All rights reserved.  Brace your abdominals while moving into different [facility] [address] [name], [name] [address] Phone # [phone] Fax [phone]

## 2015-08-20 NOTE — Therapy (Signed)
[facility] [address], [place], [place], [address] Phone: [phone]   Fax:  [phone]  Physical Therapy Treatment  Patient Details  Name: [name] MRN: [id number] Date of Birth: 1956/01/20 Referring Provider: Knox Saliva  Encounter Date: 08/20/2015      PT End of Session - 08/20/15 1533    Visit Number 3   Number of Visits 10   Date for PT Re-Evaluation 09/08/15   Authorization Type [employer]   PT Start Time [id number]   PT Stop Time [id number]   PT Time Calculation (min) 45 min   Activity Tolerance Patient tolerated treatment well   Behavior During Therapy [facility] for tasks assessed/performed      Past Medical History  Diagnosis Date  . Depression   . Fibromyalgia   . Dysrhythmia     "heart tends to flutter"  . Sleep apnea     no cpap used in many yrs after weight lost-no machine now  . Heart murmur   . DVT (deep venous thrombosis) ([place]) 01-17-13    past hx. -tx.5-6 yrs ago bilateral legs, occ. sporadic swelling, has IVC filter implanted  . Anxiety     takes Citalopram daily  . Anemia     takes Ferrous Sulfate daily  . Insomnia     takes Melatonin daily  . Hypertension     takes Metoprolol daily  . Complication of anesthesia yrs ago    slow to wake up  . CHF (congestive heart failure) (HCC)     takes Lasix daily as needed  . Asthma     2004-prior to gastric bypass and no problems since  . Pneumonia 90's    hx of  . History of bronchitis 2012 or 2013  . Peripheral neuropathy ([place])   . Arthritis   . Joint pain   . Joint swelling   . Fracture     right foot and is in a cam boot  . Chronic back pain     spondylolisthesis/stenosis/radiculopathy  . GERD (gastroesophageal reflux disease)     hx of-no meds now  . History of colon polyps   . Urinary frequency   . Urinary urgency   . Diabetes ([place])   . Gallstones     Past Surgical History  Procedure Laterality Date  . Gastric  by-pass  2004  . Esophagogastroduodenoscopy (egd) with propofol  03/01/2012    Procedure: ESOPHAGOGASTRODUODENOSCOPY (EGD) WITH PROPOFOL;  Surgeon: Arta Silence, MD;  [facility];  Service: Endoscopy;  Laterality: N/A;  . Tonsillectomy      as child  . Dilation and curettage of uterus  yrs ago  . Hernia repair  2005  . Cholecystectomy  2007  . Ligament repair Right 1987    Rt. knee scope  . Insertion of vena cava filter  01-17-13    inserted 2004- "abdomen"  . Esophagogastroduodenoscopy (egd) with propofol N/A 01/31/2013    Procedure: ESOPHAGOGASTRODUODENOSCOPY (EGD) WITH PROPOFOL;  Surgeon: Arta Silence, MD;  [facility];  Service: Endoscopy;  Laterality: N/A;  . Balloon dilation N/A 01/31/2013    Procedure: BALLOON DILATION;  Surgeon: Arta Silence, MD;  [facility];  Service: Endoscopy;  Laterality: N/A;  . Colonoscopy    . Maximum access (mas)posterior lumbar interbody fusion (plif) 2 level N/A 06/07/2014    Procedure: L4-5 L5-S1 FOR MAXIMUM ACCESS (MAS) POSTERIOR LUMBAR INTERBODY FUSION ;  Surgeon: Erline Levine, MD;  Location: [facility];  Service: Neurosurgery;  Laterality: N/A;  L4-5 L5-S1 FOR MAXIMUM ACCESS (MAS) POSTERIOR LUMBAR INTERBODY FUSION     There were no vitals filed for this visit.      Subjective Assessment - 08/20/15 1451    Subjective My knee popped out.  I have to walk with a cane now. Eating at same time of day and going to the bathroom after the eating has decresaed incontinence by 25%. Drink 2 cups of coffee per day.    Patient Stated Goals make it to the bathroom without bowel leakage, reduce urinary leakage   Currently in Pain? No/denies                         [facility] Adult PT Treatment/Exercise - 08/20/15 0001    Therapeutic Activites    Therapeutic Activities ADL's   ADL's abdominal bracing with transitional movements for sit to stand and rolling in bed   Neuro Re-ed    Neuro Re-ed Details   diaphgramatic breathing to engage abdmonals   Modalities   Modalities Cryotherapy   Cryotherapy   Number Minutes Cryotherapy 15 Minutes   Cryotherapy Location Knee  due to pain from popping out of place   Type of Cryotherapy Ice pack   Manual Therapy   Manual Therapy Soft tissue mobilization;Myofascial release   Soft tissue mobilization abdominal musculature; bil. diaphgram with breathing   Myofascial Release tissue rolling of abdinminal tissue and scar                PT Education - 08/20/15 1530    Education provided No   Education Details abdominal bracing and diaphgramatic breathing   Person(s) Educated Patient   Methods Explanation;Demonstration;Verbal cues;Handout   Comprehension Returned demonstration;Verbalized understanding          PT Short Term Goals - 08/20/15 1452    PT SHORT TERM GOAL #1   Title understand what bladder irritants are   Time 4   Period Weeks   Status Achieved   PT SHORT TERM GOAL #2   Title understand correct toileting technique   Time 4   Period Weeks   Status Achieved   PT SHORT TERM GOAL #3   Title fecal incontience decreased >/= 25%   Period Weeks   Status On-going   PT SHORT TERM GOAL #4   Title timed up and go score </= 13 seconds   Time 4   Period Weeks   Status On-going  knee popped out and unable to test           PT Long Term Goals - 07/14/15 1616    PT LONG TERM GOAL #1   Title independent with HEP and how to progress herself   Time 8   Period Weeks   Status New   PT LONG TERM GOAL #2   Title use </= 1 pad per day due to reduction of incontinence >/= 70%   Time 8   Period Weeks   Status New   PT LONG TERM GOAL #3   Title sit to stand 5 tims </= 12 seconds due to improved balance   Time 8   Period Weeks   Status New   PT LONG TERM GOAL #4   Title able to pass gas with fecal incontinence decreased >/= 70%   Time 8   Period Weeks   Status New   PT LONG TERM GOAL #5   Title pelvic floor strength >/=  3/5   Time 8  Period Weeks   Status New               Plan - 08/20/15 1534    Clinical Impression Statement Patient is a 60 year old female with diagnosis of fecal incontinence.  Patient reports fecal incontinence has improved by 25%.  Patient is eating at same time per day and toileting routinle which is helping.  Patient is having difficulty fully expanding the abdomen for daiphragmatic breathing.  She ahs rouble bracing her abdomen with transition amovements and to fully empty her rectum.  Patient will benefit form phsyical therapy to improve strength and mobility.    Rehab Potential Excellent   Clinical Impairments Affecting Rehab Potential None   PT Frequency 1x / week   PT Duration 8 weeks   PT Treatment/Interventions Biofeedback;Advice worker;Therapeutic activities;Therapeutic exercise;Balance training;Patient/family education;Manual techniques;Energy conservation;Passive range of motion   PT Next Visit Plan pelvic floor strength with ball squeeze,hip strength and stretches, balance exercises; timed up and go   PT Home Exercise Plan hip stretches   Consulted and Agree with Plan of Care Patient      Patient will benefit from skilled therapeutic intervention in order to improve the following deficits and impairments:  Abnormal gait, Decreased mobility, Decreased balance, Decreased strength, Decreased range of motion, Decreased endurance  Visit Diagnosis: Muscle weakness (generalized)     Problem List Patient Active Problem List   Diagnosis Date Noted  . Diarrhea 06/01/2015  . Lumbar radiculopathy 03/05/2015  . Abdominal bloating 02/02/2015  . Stress fracture of foot 01/28/2015  . Peripheral neuropathy ([place]) 01/17/2015  . Foot callus 01/16/2015  . Cystitis 12/14/2014  . Postoperative anemia due to acute blood loss 08/08/2014  . Lumbosacral spondylosis without myelopathy 08/10/2013  . Generalized pain 02/21/2011  . Fibromyalgia 02/21/2011  .  Essential hypertension 12/04/2006  . Hx of diabetes mellitus 10/20/2006  . Morbid obesity ([place]) 10/20/2006  . ANXIETY 10/20/2006  . MDD (major depressive disorder) ([place]) 10/20/2006  . OBSTRUCTIVE SLEEP APNEA 10/20/2006  . OSTEOARTHRITIS 10/20/2006  . DVT, HX OF 10/20/2006    Earlie Counts, PT 08/20/2015 3:38 PM   [facility] [address] [place], [place], [address] Phone: [phone]   Fax:  [phone]  Name: [name] MRN: [id number] Date of Birth: February 05, 1956

## 2015-08-26 ENCOUNTER — Other Ambulatory Visit: Payer: Self-pay | Admitting: Internal Medicine

## 2015-08-27 ENCOUNTER — Ambulatory Visit: Payer: Commercial Managed Care - HMO | Admitting: Physical Therapy

## 2015-09-02 ENCOUNTER — Ambulatory Visit (INDEPENDENT_AMBULATORY_CARE_PROVIDER_SITE_OTHER): Payer: Commercial Managed Care - HMO | Admitting: Internal Medicine

## 2015-09-02 ENCOUNTER — Ambulatory Visit (HOSPITAL_COMMUNITY)
Admission: AD | Admit: 2015-09-02 | Discharge: 2015-09-02 | Disposition: A | Discharge status: Home / Self Care | Payer: Commercial Managed Care - HMO | Source: Ambulatory Visit | Attending: Internal Medicine | Admitting: Internal Medicine

## 2015-09-02 ENCOUNTER — Encounter: Payer: Self-pay | Admitting: Internal Medicine

## 2015-09-02 ENCOUNTER — Encounter (HOSPITAL_COMMUNITY)
Admission: AD | Disposition: A | Discharge status: Home / Self Care | Payer: Self-pay | Source: Ambulatory Visit | Attending: Internal Medicine

## 2015-09-02 VITALS — BP 130/76 | HR 68 | Ht 67.0 in | Wt 237.6 lb

## 2015-09-02 DIAGNOSIS — R0989 Other specified symptoms and signs involving the circulatory and respiratory systems: Secondary | ICD-10-CM | POA: Insufficient documentation

## 2015-09-02 DIAGNOSIS — M6289 Other specified disorders of muscle: Secondary | ICD-10-CM

## 2015-09-02 DIAGNOSIS — D649 Anemia, unspecified: Secondary | ICD-10-CM | POA: Diagnosis not present

## 2015-09-02 DIAGNOSIS — Z980 Intestinal bypass and anastomosis status: Secondary | ICD-10-CM | POA: Diagnosis not present

## 2015-09-02 DIAGNOSIS — Z86718 Personal history of other venous thrombosis and embolism: Secondary | ICD-10-CM | POA: Insufficient documentation

## 2015-09-02 DIAGNOSIS — Q394 Esophageal web: Secondary | ICD-10-CM | POA: Diagnosis not present

## 2015-09-02 DIAGNOSIS — Z79899 Other long term (current) drug therapy: Secondary | ICD-10-CM | POA: Insufficient documentation

## 2015-09-02 DIAGNOSIS — N8184 Pelvic muscle wasting: Secondary | ICD-10-CM | POA: Diagnosis not present

## 2015-09-02 DIAGNOSIS — E1142 Type 2 diabetes mellitus with diabetic polyneuropathy: Secondary | ICD-10-CM | POA: Diagnosis not present

## 2015-09-02 DIAGNOSIS — I509 Heart failure, unspecified: Secondary | ICD-10-CM | POA: Insufficient documentation

## 2015-09-02 DIAGNOSIS — Z87891 Personal history of nicotine dependence: Secondary | ICD-10-CM | POA: Insufficient documentation

## 2015-09-02 DIAGNOSIS — F329 Major depressive disorder, single episode, unspecified: Secondary | ICD-10-CM | POA: Insufficient documentation

## 2015-09-02 DIAGNOSIS — R1310 Dysphagia, unspecified: Secondary | ICD-10-CM

## 2015-09-02 DIAGNOSIS — R159 Full incontinence of feces: Secondary | ICD-10-CM

## 2015-09-02 DIAGNOSIS — Z9884 Bariatric surgery status: Secondary | ICD-10-CM | POA: Diagnosis not present

## 2015-09-02 DIAGNOSIS — R12 Heartburn: Secondary | ICD-10-CM | POA: Insufficient documentation

## 2015-09-02 DIAGNOSIS — Z9049 Acquired absence of other specified parts of digestive tract: Secondary | ICD-10-CM | POA: Diagnosis not present

## 2015-09-02 DIAGNOSIS — R09A2 Foreign body sensation, throat: Secondary | ICD-10-CM

## 2015-09-02 DIAGNOSIS — I110 Hypertensive heart disease with heart failure: Secondary | ICD-10-CM | POA: Insufficient documentation

## 2015-09-02 DIAGNOSIS — R32 Unspecified urinary incontinence: Secondary | ICD-10-CM

## 2015-09-02 DIAGNOSIS — K222 Esophageal obstruction: Secondary | ICD-10-CM | POA: Insufficient documentation

## 2015-09-02 DIAGNOSIS — F419 Anxiety disorder, unspecified: Secondary | ICD-10-CM | POA: Insufficient documentation

## 2015-09-02 DIAGNOSIS — Z7982 Long term (current) use of aspirin: Secondary | ICD-10-CM | POA: Diagnosis not present

## 2015-09-02 DIAGNOSIS — K228 Other specified diseases of esophagus: Secondary | ICD-10-CM

## 2015-09-02 DIAGNOSIS — M797 Fibromyalgia: Secondary | ICD-10-CM | POA: Diagnosis not present

## 2015-09-02 DIAGNOSIS — R198 Other specified symptoms and signs involving the digestive system and abdomen: Secondary | ICD-10-CM

## 2015-09-02 DIAGNOSIS — Z79891 Long term (current) use of opiate analgesic: Secondary | ICD-10-CM | POA: Diagnosis not present

## 2015-09-02 HISTORY — PX: ESOPHAGOGASTRODUODENOSCOPY (EGD) WITH PROPOFOL: SHX5813

## 2015-09-02 LAB — GLUCOSE, CAPILLARY: GLUCOSE-CAPILLARY: 74 mg/dL (ref 65–99)

## 2015-09-02 SURGERY — ESOPHAGOGASTRODUODENOSCOPY (EGD) WITH PROPOFOL
Anesthesia: Moderate Sedation

## 2015-09-02 MED ORDER — DIPHENHYDRAMINE HCL 50 MG/ML IJ SOLN
INTRAMUSCULAR | Status: AC
  Filled 2015-09-02: qty 1

## 2015-09-02 MED ORDER — FENTANYL CITRATE (PF) 100 MCG/2ML IJ SOLN
INTRAMUSCULAR | Status: AC
  Filled 2015-09-02: qty 2

## 2015-09-02 MED ORDER — MIDAZOLAM HCL 5 MG/ML IJ SOLN
INTRAMUSCULAR | Status: AC
  Filled 2015-09-02: qty 2

## 2015-09-02 MED ORDER — SODIUM CHLORIDE 0.9 % IV SOLN: INTRAVENOUS | Status: DC

## 2015-09-02 MED ORDER — FENTANYL CITRATE (PF) 100 MCG/2ML IJ SOLN
INTRAMUSCULAR | Status: DC | PRN
  Administered 2015-09-02 (×2): 25 ug via INTRAVENOUS

## 2015-09-02 MED ORDER — DIPHENHYDRAMINE HCL 50 MG/ML IJ SOLN
INTRAMUSCULAR | Status: DC | PRN
  Administered 2015-09-02: 25 mg via INTRAVENOUS

## 2015-09-02 MED ORDER — MIDAZOLAM HCL 10 MG/2ML IJ SOLN
INTRAMUSCULAR | Status: DC | PRN
  Administered 2015-09-02: 1 mg via INTRAVENOUS
  Administered 2015-09-02 (×2): 2 mg via INTRAVENOUS

## 2015-09-02 MED ORDER — DEXTROSE-NACL 5-0.45 % IV SOLN
INTRAVENOUS | Status: DC
  Administered 2015-09-02: 500 mL via INTRAVENOUS

## 2015-09-02 MED ORDER — PANTOPRAZOLE SODIUM 40 MG PO TBEC: 40.0000 mg | DELAYED_RELEASE_TABLET | Freq: Every day | ORAL | Status: DC

## 2015-09-02 MED ORDER — BUTAMBEN-TETRACAINE-BENZOCAINE 2-2-14 % EX AERO
INHALATION_SPRAY | CUTANEOUS | Status: DC | PRN
  Administered 2015-09-02: 2 via TOPICAL

## 2015-09-02 SURGICAL SUPPLY — 15 items

## 2015-09-02 NOTE — Patient Instructions (Signed)
  Please go to the [facility] now to have an EGD procedure done today.  Have nothing to eat or drink between now and your procedure.  We will get your records from Dr Paulita Fujita for review.  I appreciate the opportunity to care for you. Silvano Rusk, MD, [facility]

## 2015-09-02 NOTE — Interval H&P Note (Signed)
History and Physical Interval Note:  09/02/2015 4:11 PM  ARIE DESJARLAIS  has presented today for surgery, with the diagnosis of dysphagia  The various methods of treatment have been discussed with the patient and family. After consideration of risks, benefits and other options for treatment, the patient has consented to  Procedure(s): ESOPHAGOGASTRODUODENOSCOPY (EGD) WITH PROPOFOL (N/A) as a surgical intervention .  The patient's history has been reviewed, patient examined, no change in status, stable for surgery.  I have reviewed the patient's chart and labs.  Questions were answered to the patient's satisfaction.     Silvano Rusk

## 2015-09-02 NOTE — Op Note (Signed)
[facility] Patient Name: [name] Procedure Date: 09/02/2015 MRN: [id number] Attending MD: Gatha Mayer , MD Date of Birth: 07/09/1955 CSN: [id number] Age: 60 Admit Type: Outpatient Procedure:                Upper GI endoscopy Indications:              Dysphagia, Odynophagia, Heartburn Providers:                Gatha Mayer, MD, Hilma Favors, RN, Zenon Mayo, RN, William Dalton, Technician Referring MD:              Medicines:                Fentanyl 50 micrograms IV, Midazolam 5 mg IV,                            Diphenhydramine 25 mg IV, Cetacaine spray Complications:            No immediate complications. Estimated Blood Loss:     Estimated blood loss: none. Procedure:                Pre-Anesthesia Assessment:                           - Prior to the procedure, a History and Physical                            was performed, and patient medications and                            allergies were reviewed. The patient's tolerance of                            previous anesthesia was also reviewed. The risks                            and benefits of the procedure and the sedation                            options and risks were discussed with the patient.                            All questions were answered, and informed consent                            was obtained. Prior Anticoagulants: The patient                            last took aspirin on the day of the procedure. ASA                            Grade Assessment: III - A patient with severe  systemic disease. After reviewing the risks and                            benefits, the patient was deemed in satisfactory                            condition to undergo the procedure.                           After obtaining informed consent, the endoscope was                            passed under direct vision. Throughout the             procedure, the patient's blood pressure, pulse, and                            oxygen saturations were monitored continuously. The                            [id number]) scope was introduced through the                            mouth, and advanced to the second part of duodenum.                            The upper GI endoscopy was accomplished without                            difficulty. The patient tolerated the procedure                            well. Scope In: Scope Out: Findings:      A non-obstructing Schatzki ring (acquired) was found at the       gastroesophageal junction. A TTS dilator was passed through the scope.       Dilation with an 18-19-20 mm balloon dilator was performed to 20 mm. The       dilation site was examined following endoscope reinsertion and showed no       change. Estimated blood loss: none.      Evidence of a prior surgery ? stapling were found in the stomach. This       was characterized by healthy appearing mucosa.      There was evidence of a widely patent previous surgical anastomosis in       the duodenal bulb. This was characterized by Small amount of food in       bulb/pouch.      The exam was otherwise without abnormality.      The cardia and gastric fundus were normal on retroflexion. Impression:               - Non-obstructing Schatzki ring. Dilated.                           - A prior surgery ? stapling were found,  characterized by healthy appearing mucosa.                           - Widely patent previous surgical anastomosis,                            characterized by healthy appearing mucosa was found                            in the duodenum.                           - The examination was otherwise normal.                           - No specimens collected. Moderate Sedation:      Moderate (conscious) sedation was administered by the endoscopy nurse       and supervised by the  endoscopist. The following parameters were       monitored: oxygen saturation, heart rate, blood pressure, and response       to care. Total physician intraservice time was 15 minutes. Recommendation:           - Patient has a contact number available for                            emergencies. The signs and symptoms of potential                            delayed complications were discussed with the                            patient. Return to normal activities tomorrow.                            Written discharge instructions were provided to the                            patient.                           - Continue present medications.                           - Use Protonix (pantoprazole) 40 mg PO daily.                           - Chopped diet Until replaces dentures - use                            dysphagia 2.                           - Return to my office PRN. Procedure Code(s):        --- Professional ---  [phone], Esophagogastroduodenoscopy, flexible,                            transoral; with transendoscopic balloon dilation of                            esophagus (less than 30 mm diameter)                           G0500, Moderate sedation services provided by the                            same physician or other qualified health care                            professional performing a gastrointestinal                            endoscopic service that sedation supports,                            requiring the presence of an independent trained                            observer to assist in the monitoring of the                            patient's level of consciousness and physiological                            status; initial 15 minutes of intra-service time;                            patient age 43 years or older (additional time may                            be reported with [phone], as appropriate) Diagnosis Code(s):        ---  Professional ---                           K22.2, Esophageal obstruction                           Z98.0, Intestinal bypass and anastomosis status                           R13.10, Dysphagia, unspecified                           R12, Heartburn CPT copyright 2016 [employer]. All rights reserved. The codes documented in this report are preliminary and upon coder review may  be revised to meet current compliance requirements. Gatha Mayer, MD 09/02/2015 4:47:26 PM This report has been signed electronically. Number of Addenda: 0

## 2015-09-02 NOTE — H&P (View-Only) (Signed)
Subjective:    Patient ID: [name], female    DOB: [birth year], 60 y.o.   MRN: [id number] Cc: f/u fecal incontinence - unable to swallow normally HPI The patient is a pleasant 60 year old African-American woman I meeting for the first time after she saw her physician assistant. She has a chronic fecal and urinary incontinence problem with pelvic floor dysfunction and is undergoing physical therapy with [name]. That is somewhat better but she is having some nocturnal incontinence which is a bit different. She wonders if her problems are related to previous back surgery. She has had a colonoscopy in the past that is negative though we don't have those exact records we will obtain them.  Another problem is dysphagia which is been intermittent. However on Sunday 2 days ago she ate rice which has been a problem in the past and she feels like it still lodged in her esophagus. She is able to drink liquids in small amounts but it's painful, and she feels like she is going to regurgitate if she does. She is handling her secretions. She has a pressure in her chest related to this ever since eating the rice. She is edentulous and has lost her dentures. She has not had solid food since Sunday 2 days ago. She is intermittently and persistently uncomfortable with this. Allergies  Allergen Reactions  . Carafate [Sucralfate] Hives    Other reaction(s): Angioedema (ALLERGY/intolerance) hives scratching  . Sulfonamide Derivatives Rash    REACTION: Syncope   No facility-administered medications prior to visit.   Outpatient Prescriptions Prior to Visit  Medication Sig Dispense Refill  . aspirin EC 81 MG tablet Take 81 mg by mouth daily.    Marland Kitchen BLACK COHOSH PO Take 1 tablet by mouth daily.    Marland Kitchen buPROPion (WELLBUTRIN SR) 150 MG 12 hr tablet Take 1 tablet (150 mg total) by mouth 2 (two) times daily. 60 tablet 6  . CALCIUM PO Take 1 tablet by mouth daily. 600mg     . CRANBERRY PO Take 1 tablet by  mouth daily.    . cyanocobalamin (,VITAMIN B-12,) 1000 MCG/ML injection Inject 1 mL (1,000 mcg total) into the muscle once. 10 mL 0  . ferrous sulfate 325 (65 FE) MG tablet Take 325 mg by mouth daily.     . furosemide (LASIX) 40 MG tablet Take 1 tablet (40 mg total) by mouth daily as needed for edema. 90 tablet 3  . gabapentin (NEURONTIN) 400 MG capsule TAKE 1 CAPSULE FOUR TIMES DAILY 360 capsule 3  . hydrocortisone (ANUSOL-HC) 25 MG suppository Place 1 suppository (25 mg total) rectally every 12 (twelve) hours. 20 suppository 1  . MELATONIN PO Take 1 tablet by mouth at bedtime as needed. For sleep    . metoCLOPramide (REGLAN) 5 MG tablet Take 1 tablet (5 mg total) by mouth every 6 (six) hours as needed for nausea. 90 tablet 3  . metoprolol tartrate (LOPRESSOR) 25 MG tablet Take 0.5 tablets (12.5 mg total) by mouth 2 (two) times daily. 90 tablet 3  . Multiple Vitamins-Minerals (MULTIVITAMINS THER. W/MINERALS) TABS Take 1 tablet by mouth daily.     . nitroGLYCERIN (NITROSTAT) 0.4 MG SL tablet Place 0.4 mg under the tongue every 5 (five) minutes as needed for chest pain.     . polyethylene glycol (MIRALAX / GLYCOLAX) packet Take 17 g by mouth daily.    . potassium chloride SA (K-DUR,KLOR-CON) 20 MEQ tablet Take 1 tablet (20 mEq total) by mouth daily. 90 tablet  3  . promethazine (PHENERGAN) 25 MG tablet Take 25 mg by mouth every 6 (six) hours as needed for nausea.    . traMADol (ULTRAM) 50 MG tablet Take 1 tablet (50 mg total) by mouth 4 (four) times daily. 120 tablet 0  . vitamin C (ASCORBIC ACID) 500 MG tablet Take 500 mg by mouth daily.    Marland Kitchen VITAMIN D, CHOLECALCIFEROL, PO Take 1,200 Units by mouth daily.    . metroNIDAZOLE (FLAGYL) 250 MG tablet Take 1 tablet (250 mg total) by mouth 3 (three) times daily. 42 tablet 0  . Acetaminophen 500 MG coapsule     . pregabalin (LYRICA) 100 MG capsule Take 1 capsule (100 mg total) by mouth 2 (two) times daily. 60 capsule 6  . SYRINGE-NEEDLE, DISP, 3 ML 25G X  5/8" 3 ML MISC Inject 21mL once weekly for 4 weeks and then once monthly 50 each 0  . triamcinolone cream (KENALOG) 0.5 % Apply 1 application topically 2 (two) times daily. To affected areas. 30 g 3   Past Medical History  Diagnosis Date  . Depression   . Fibromyalgia   . Dysrhythmia     "heart tends to flutter"  . Sleep apnea     no cpap used in many yrs after weight lost-no machine now  . Heart murmur   . DVT (deep venous thrombosis) ([place]) 01-17-13    past hx. -tx.5-6 yrs ago bilateral legs, occ. sporadic swelling, has IVC filter implanted  . Anxiety     takes Citalopram daily  . Anemia     takes Ferrous Sulfate daily  . Insomnia     takes Melatonin daily  . Hypertension     takes Metoprolol daily  . Complication of anesthesia yrs ago    slow to wake up  . CHF (congestive heart failure) ([facility])     takes Lasix daily as needed  . Asthma     2004-prior to gastric bypass and no problems since  . Pneumonia 90's    hx of  . History of bronchitis 2012 or 2013  . Peripheral neuropathy ([place])   . Arthritis   . Joint pain   . Joint swelling   . Fracture     right foot and is in a cam boot  . Chronic back pain     spondylolisthesis/stenosis/radiculopathy  . GERD (gastroesophageal reflux disease)     hx of-no meds now  . History of colon polyps   . Urinary frequency   . Urinary urgency   . Diabetes ([place])   . Gallstones   . Pelvic floor dysfunction    Past Surgical History  Procedure Laterality Date  . Gastric by-pass  2004  . Esophagogastroduodenoscopy (egd) with propofol  03/01/2012    Procedure: ESOPHAGOGASTRODUODENOSCOPY (EGD) WITH PROPOFOL;  Surgeon: Arta Silence, MD;  Location: [facility];  Service: Endoscopy;  Laterality: N/A;  . Tonsillectomy      as child  . Dilation and curettage of uterus  yrs ago  . Hernia repair  2005  . Cholecystectomy  2007  . Ligament repair Right 1987    Rt. knee scope  . Insertion of vena cava filter  01-17-13    inserted 2004-  "abdomen"  . Esophagogastroduodenoscopy (egd) with propofol N/A 01/31/2013    Procedure: ESOPHAGOGASTRODUODENOSCOPY (EGD) WITH PROPOFOL;  Surgeon: Arta Silence, MD;  Location: [facility];  Service: Endoscopy;  Laterality: N/A;  . Balloon dilation N/A 01/31/2013    Procedure: BALLOON DILATION;  Surgeon: Arta Silence, MD;  Location: [facility];  Service: Endoscopy;  Laterality: N/A;  . Colonoscopy    . Maximum access (mas)posterior lumbar interbody fusion (plif) 2 level N/A 06/07/2014    Procedure: L4-5 L5-S1 FOR MAXIMUM ACCESS (MAS) POSTERIOR LUMBAR INTERBODY FUSION ;  Surgeon: Erline Levine, MD;  Location: [facility];  Service: Neurosurgery;  Laterality: N/A;  L4-5 L5-S1 FOR MAXIMUM ACCESS (MAS) POSTERIOR LUMBAR INTERBODY FUSION    Social History   Social History  . Marital Status: Widowed    Spouse Name: N/A  . Number of Children: 0  . Years of Education: college   Occupational History  . retired    Social History Main Topics  . Smoking status: Former Smoker -- 0.50 packs/day for 10 years    Types: Cigarettes    Quit date: 04/19/2009  . Smokeless tobacco: Never Used  . Alcohol Use: 0.0 oz/week    0 Standard drinks or equivalent per week     Comment: occ. social- wine-1 drink monthly  . Drug Use: No  . Sexual Activity: Not Currently   Other Topics Concern  . None   Social History Narrative   Family History  Problem Relation Age of Onset  . Diabetes Mother   . Heart disease Father   . Hypertension Father   . Kidney disease Brother   . Colon cancer Neg Hx   . Colon polyps Neg Hx   . Stomach cancer Neg Hx   . Esophageal cancer Neg Hx   . Pancreatic cancer Neg Hx   . Liver disease Neg Hx     Review of Systems As per history of present illness    Objective:   Physical Exam @BP  130/76 mmHg  Pulse 68  Ht 5\' 7"  (1.702 m)  Wt 237 lb 9.6 oz (107.775 kg)  BMI 37.20 kg/m2@  General:  Middle-aged black woman looking mildly uncomfortable. In no severe acute  distress Eyes:   Anicteric Mouth:  Edentulous no dentures Lungs:  clear Heart:: S1S2 no rubs, murmurs or gallops Abdomen:  soft and nontender, BS+ Ext:   no edema, cyanosis or clubbing    Data Reviewed:  Prior upper endoscopy last in 2014 for complaints of dysphagia. Evidence of previous gastric stapling procedure but no stricture. Previous duodenal ulcer was healed. Procedure by Dr. Paulita Fujita.    Assessment & Plan:   Encounter Diagnoses  Name Primary?  Marland Kitchen Dysphagia Yes  . Sensation of foreign body in esophagus   . Urinary and fecal incontinence   . Pelvic floor dysfunction    She is fairly uncomfortable so she needs an upper endoscopy today I think she could have a persistent partial food impaction. It's hard to say but the only way to know his to do an endoscopy and possible removal. This will be done this afternoon either by me or my partner. Endoscopy risks please  She should continue with her physical therapy. She is somewhat improved with that. I will track down the colonoscopy report. Further plans pending PT.  Follow-up pending the above.

## 2015-09-02 NOTE — Progress Notes (Addendum)
Subjective:    Patient ID: [name], female    DOB: 07-28-1955, 60 y.o.   MRN: [id number] Cc: f/u fecal incontinence - unable to swallow normally HPI The patient is a pleasant 60 year old African-American woman I meeting for the first time after she saw her physician assistant. She has a chronic fecal and urinary incontinence problem with pelvic floor dysfunction and is undergoing physical therapy with [name]. That is somewhat better but she is having some nocturnal incontinence which is a bit different. She wonders if her problems are related to previous back surgery. She has had a colonoscopy in the past that is negative though we don't have those exact records we will obtain them.  Another problem is dysphagia which is been intermittent. However on Sunday 2 days ago she ate rice which has been a problem in the past and she feels like it still lodged in her esophagus. She is able to drink liquids in small amounts but it's painful, and she feels like she is going to regurgitate if she does. She is handling her secretions. She has a pressure in her chest related to this ever since eating the rice. She is edentulous and has lost her dentures. She has not had solid food since Sunday 2 days ago. She is intermittently and persistently uncomfortable with this. Allergies  Allergen Reactions  . Carafate [Sucralfate] Hives    Other reaction(s): Angioedema (ALLERGY/intolerance) hives scratching  . Sulfonamide Derivatives Rash    REACTION: Syncope   No facility-administered medications prior to visit.   Outpatient Prescriptions Prior to Visit  Medication Sig Dispense Refill  . aspirin EC 81 MG tablet Take 81 mg by mouth daily.    Marland Kitchen BLACK COHOSH PO Take 1 tablet by mouth daily.    Marland Kitchen buPROPion (WELLBUTRIN SR) 150 MG 12 hr tablet Take 1 tablet (150 mg total) by mouth 2 (two) times daily. 60 tablet 6  . CALCIUM PO Take 1 tablet by mouth daily. 600mg     . CRANBERRY PO Take 1 tablet by  mouth daily.    . cyanocobalamin (,VITAMIN B-12,) 1000 MCG/ML injection Inject 1 mL (1,000 mcg total) into the muscle once. 10 mL 0  . ferrous sulfate 325 (65 FE) MG tablet Take 325 mg by mouth daily.     . furosemide (LASIX) 40 MG tablet Take 1 tablet (40 mg total) by mouth daily as needed for edema. 90 tablet 3  . gabapentin (NEURONTIN) 400 MG capsule TAKE 1 CAPSULE FOUR TIMES DAILY 360 capsule 3  . hydrocortisone (ANUSOL-HC) 25 MG suppository Place 1 suppository (25 mg total) rectally every 12 (twelve) hours. 20 suppository 1  . MELATONIN PO Take 1 tablet by mouth at bedtime as needed. For sleep    . metoCLOPramide (REGLAN) 5 MG tablet Take 1 tablet (5 mg total) by mouth every 6 (six) hours as needed for nausea. 90 tablet 3  . metoprolol tartrate (LOPRESSOR) 25 MG tablet Take 0.5 tablets (12.5 mg total) by mouth 2 (two) times daily. 90 tablet 3  . Multiple Vitamins-Minerals (MULTIVITAMINS THER. W/MINERALS) TABS Take 1 tablet by mouth daily.     . nitroGLYCERIN (NITROSTAT) 0.4 MG SL tablet Place 0.4 mg under the tongue every 5 (five) minutes as needed for chest pain.     . polyethylene glycol (MIRALAX / GLYCOLAX) packet Take 17 g by mouth daily.    . potassium chloride SA (K-DUR,KLOR-CON) 20 MEQ tablet Take 1 tablet (20 mEq total) by mouth daily. 90 tablet  3  . promethazine (PHENERGAN) 25 MG tablet Take 25 mg by mouth every 6 (six) hours as needed for nausea.    . traMADol (ULTRAM) 50 MG tablet Take 1 tablet (50 mg total) by mouth 4 (four) times daily. 120 tablet 0  . vitamin C (ASCORBIC ACID) 500 MG tablet Take 500 mg by mouth daily.    Marland Kitchen VITAMIN D, CHOLECALCIFEROL, PO Take 1,200 Units by mouth daily.    . metroNIDAZOLE (FLAGYL) 250 MG tablet Take 1 tablet (250 mg total) by mouth 3 (three) times daily. 42 tablet 0  . Acetaminophen 500 MG coapsule     . pregabalin (LYRICA) 100 MG capsule Take 1 capsule (100 mg total) by mouth 2 (two) times daily. 60 capsule 6  . SYRINGE-NEEDLE, DISP, 3 ML 25G X  5/8" 3 ML MISC Inject 21mL once weekly for 4 weeks and then once monthly 50 each 0  . triamcinolone cream (KENALOG) 0.5 % Apply 1 application topically 2 (two) times daily. To affected areas. 30 g 3   Past Medical History  Diagnosis Date  . Depression   . Fibromyalgia   . Dysrhythmia     "heart tends to flutter"  . Sleep apnea     no cpap used in many yrs after weight lost-no machine now  . Heart murmur   . DVT (deep venous thrombosis) ([place]) 01-17-13    past hx. -tx.5-6 yrs ago bilateral legs, occ. sporadic swelling, has IVC filter implanted  . Anxiety     takes Citalopram daily  . Anemia     takes Ferrous Sulfate daily  . Insomnia     takes Melatonin daily  . Hypertension     takes Metoprolol daily  . Complication of anesthesia yrs ago    slow to wake up  . CHF (congestive heart failure) (HCC)     takes Lasix daily as needed  . Asthma     2004-prior to gastric bypass and no problems since  . Pneumonia 90's    hx of  . History of bronchitis 2012 or 2013  . Peripheral neuropathy ([place])   . Arthritis   . Joint pain   . Joint swelling   . Fracture     right foot and is in a cam boot  . Chronic back pain     spondylolisthesis/stenosis/radiculopathy  . GERD (gastroesophageal reflux disease)     hx of-no meds now  . History of colon polyps   . Urinary frequency   . Urinary urgency   . Diabetes ([place])   . Gallstones   . Pelvic floor dysfunction    Past Surgical History  Procedure Laterality Date  . Gastric by-pass  2004  . Esophagogastroduodenoscopy (egd) with propofol  03/01/2012    Procedure: ESOPHAGOGASTRODUODENOSCOPY (EGD) WITH PROPOFOL;  Surgeon: Arta Silence, MD;  [facility]: [facility];  Service: Endoscopy;  Laterality: N/A;  . Tonsillectomy      as child  . Dilation and curettage of uterus  yrs ago  . Hernia repair  2005  . Cholecystectomy  2007  . Ligament repair Right 1987    Rt. knee scope  . Insertion of vena cava filter  01-17-13    inserted 2004-  "abdomen"  . Esophagogastroduodenoscopy (egd) with propofol N/A 01/31/2013    Procedure: ESOPHAGOGASTRODUODENOSCOPY (EGD) WITH PROPOFOL;  Surgeon: Arta Silence, MD;  [facility];  Service: Endoscopy;  Laterality: N/A;  . Balloon dilation N/A 01/31/2013    Procedure: BALLOON DILATION;  Surgeon: Arta Silence, MD;  [facility];  Service: Endoscopy;  Laterality: N/A;  . Colonoscopy    . Maximum access (mas)posterior lumbar interbody fusion (plif) 2 level N/A 06/07/2014    Procedure: L4-5 L5-S1 FOR MAXIMUM ACCESS (MAS) POSTERIOR LUMBAR INTERBODY FUSION ;  Surgeon: Erline Levine, MD;  Location: [facility];  Service: Neurosurgery;  Laterality: N/A;  L4-5 L5-S1 FOR MAXIMUM ACCESS (MAS) POSTERIOR LUMBAR INTERBODY FUSION    Social History   Social History  . Marital Status: Widowed    Spouse Name: N/A  . Number of Children: 0  . Years of Education: college   Occupational History  . retired    Social History Main Topics  . Smoking status: Former Smoker -- 0.50 packs/day for 10 years    Types: Cigarettes    Quit date: 04/19/2009  . Smokeless tobacco: Never Used  . Alcohol Use: 0.0 oz/week    0 Standard drinks or equivalent per week     Comment: occ. social- wine-1 drink monthly  . Drug Use: No  . Sexual Activity: Not Currently   Other Topics Concern  . None   Social History Narrative   Family History  Problem Relation Age of Onset  . Diabetes Mother   . Heart disease Father   . Hypertension Father   . Kidney disease Brother   . Colon cancer Neg Hx   . Colon polyps Neg Hx   . Stomach cancer Neg Hx   . Esophageal cancer Neg Hx   . Pancreatic cancer Neg Hx   . Liver disease Neg Hx     Review of Systems As per history of present illness    Objective:   Physical Exam @BP  130/76 mmHg  Pulse 68  Ht 5\' 7"  (1.702 m)  Wt 237 lb 9.6 oz (107.775 kg)  BMI 37.20 kg/m2@  General:  Middle-aged black woman looking mildly uncomfortable. In no severe acute  distress Eyes:   Anicteric Mouth:  Edentulous no dentures Lungs:  clear Heart:: S1S2 no rubs, murmurs or gallops Abdomen:  soft and nontender, BS+ Ext:   no edema, cyanosis or clubbing    Data Reviewed:  Prior upper endoscopy last in 2014 for complaints of dysphagia. Evidence of previous gastric stapling procedure but no stricture. Previous duodenal ulcer was healed. Procedure by Dr. Paulita Fujita.    Assessment & Plan:   Encounter Diagnoses  Name Primary?  Marland Kitchen Dysphagia Yes  . Sensation of foreign body in esophagus   . Urinary and fecal incontinence   . Pelvic floor dysfunction    She is fairly uncomfortable so she needs an upper endoscopy today I think she could have a persistent partial food impaction. It's hard to say but the only way to know his to do an endoscopy and possible removal. This will be done this afternoon either by me or my partner. Endoscopy risks please  She should continue with her physical therapy. She is somewhat improved with that. I will track down the colonoscopy report. Further plans pending PT.  Follow-up pending the above.  Records reviewed - one important note is that prior colonoscopy "sub-optimal prep"  Will have her return to discuss repeating this or other screening

## 2015-09-02 NOTE — Discharge Instructions (Signed)
° °  I did not find any rice in there. I stretched the esophagus to see if that helps. Please try a dysphagia 2 diet (attached) until you get some dentures.  I hope this helps - if still having problems let me know.  I sent a prescription for pantoprazole to the pharmacy - it reduces acid to relieve heartburn and chest pain.  I appreciate the opportunity to care for you. Gatha Mayer, MD, FACG  YOU HAD AN ENDOSCOPIC PROCEDURE TODAY: Refer to the procedure report and other information in the discharge instructions given to you for any specific questions about what was found during the examination. If this information does not answer your questions, please call Dr. Celesta Aver office at [phone] to clarify.   YOU SHOULD EXPECT: Some feelings of bloating in the abdomen. Passage of more gas than usual. Walking can help get rid of the air that was put into your GI tract during the procedure and reduce the bloating. If you had a lower endoscopy (such as a colonoscopy or flexible sigmoidoscopy) you may notice spotting of blood in your stool or on the toilet paper. Some abdominal soreness may be present for a day or two, also.  DIET: Your first meal following the procedure should be a light meal and then it is ok to progress to your normal diet. A half-sandwich or bowl of soup is an example of a good first meal. Heavy or fried foods are harder to digest and may make you feel nauseous or bloated. Drink plenty of fluids but you should avoid alcoholic beverages for 24 hours.   ACTIVITY: Your care partner should take you home directly after the procedure. You should plan to take it easy, moving slowly for the rest of the day. You can resume normal activity the day after the procedure however YOU SHOULD NOT DRIVE, use power tools, machinery or perform tasks that involve climbing or major physical exertion for 24 hours (because of the sedation medicines used during the test).   SYMPTOMS TO REPORT  IMMEDIATELY: A gastroenterologist can be reached at any hour. Please call [phone]  for any of the following symptoms:   Following upper endoscopy (EGD, EUS, ERCP, esophageal dilation) Vomiting of blood or coffee ground material  New, significant abdominal pain  New, significant chest pain or pain under the shoulder blades  Painful or persistently difficult swallowing  New shortness of breath  Black, tarry-looking or red, bloody stools

## 2015-09-03 ENCOUNTER — Ambulatory Visit: Payer: Commercial Managed Care - HMO | Admitting: Physical Therapy

## 2015-09-03 ENCOUNTER — Encounter (HOSPITAL_COMMUNITY): Payer: Self-pay | Admitting: Internal Medicine

## 2015-09-03 DIAGNOSIS — R159 Full incontinence of feces: Secondary | ICD-10-CM | POA: Diagnosis not present

## 2015-09-03 DIAGNOSIS — M6281 Muscle weakness (generalized): Secondary | ICD-10-CM

## 2015-09-03 NOTE — Therapy (Addendum)
[facility] [address] [place], [place], [address] Phone: [phone]   Fax:  [phone]  Physical Therapy Treatment  Patient Details  Name: [name] MRN: [id number] Date of Birth: 1955/09/02 Referring Provider: Knox Saliva  Encounter Date: 09/03/2015      PT End of Session - 09/03/15 1330    Visit Number 4   Number of Visits 10   Date for PT Re-Evaluation 11/03/15   Authorization Type [place]   PT Start Time 7106  came late   PT Stop Time 1317   PT Time Calculation (min) 37 min   Activity Tolerance Patient tolerated treatment well   Behavior During Therapy [facility] for tasks assessed/performed      Past Medical History  Diagnosis Date  . Depression   . Fibromyalgia   . Dysrhythmia     "heart tends to flutter"  . Sleep apnea     no cpap used in many yrs after weight lost-no machine now  . Heart murmur   . DVT (deep venous thrombosis) ([place]) 01-17-13    past hx. -tx.5-6 yrs ago bilateral legs, occ. sporadic swelling, has IVC filter implanted  . Anxiety     takes Citalopram daily  . Anemia     takes Ferrous Sulfate daily  . Insomnia     takes Melatonin daily  . Hypertension     takes Metoprolol daily  . Complication of anesthesia yrs ago    slow to wake up  . CHF (congestive heart failure) (HCC)     takes Lasix daily as needed  . Asthma     2004-prior to gastric bypass and no problems since  . Pneumonia 90's    hx of  . History of bronchitis 2012 or 2013  . Peripheral neuropathy ([place])   . Arthritis   . Joint pain   . Joint swelling   . Fracture     right foot and is in a cam boot  . Chronic back pain     spondylolisthesis/stenosis/radiculopathy  . GERD (gastroesophageal reflux disease)     hx of-no meds now  . History of colon polyps   . Urinary frequency   . Urinary urgency   . Diabetes ([place])   . Gallstones   . Pelvic floor dysfunction     Past Surgical  History  Procedure Laterality Date  . Gastric by-pass  2004  . Esophagogastroduodenoscopy (egd) with propofol  03/01/2012    Procedure: ESOPHAGOGASTRODUODENOSCOPY (EGD) WITH PROPOFOL;  Surgeon: Arta Silence, MD;  [facility];  Service: Endoscopy;  Laterality: N/A;  . Tonsillectomy      as child  . Dilation and curettage of uterus  yrs ago  . Hernia repair  2005  . Cholecystectomy  2007  . Ligament repair Right 1987    Rt. knee scope  . Insertion of vena cava filter  01-17-13    inserted 2004- "abdomen"  . Esophagogastroduodenoscopy (egd) with propofol N/A 01/31/2013    Procedure: ESOPHAGOGASTRODUODENOSCOPY (EGD) WITH PROPOFOL;  Surgeon: Arta Silence, MD;  [facility];  Service: Endoscopy;  Laterality: N/A;  . Balloon dilation N/A 01/31/2013    Procedure: BALLOON DILATION;  Surgeon: Arta Silence, MD;  [facility];  Service: Endoscopy;  Laterality: N/A;  . Colonoscopy    . Maximum access (mas)posterior lumbar interbody fusion (plif) 2 level N/A 06/07/2014    Procedure: L4-5 L5-S1 FOR MAXIMUM ACCESS (MAS) POSTERIOR LUMBAR INTERBODY FUSION ;  Surgeon: Erline Levine, MD;  Location: [facility];  Service: Neurosurgery;  Laterality: N/A;  L4-5 L5-S1 FOR MAXIMUM ACCESS (MAS) POSTERIOR LUMBAR INTERBODY FUSION   . Esophagogastroduodenoscopy (egd) with propofol N/A 09/02/2015    Procedure: ESOPHAGOGASTRODUODENOSCOPY (EGD) WITH PROPOFOL;  Surgeon: Gatha Mayer, MD;  [facility];  Service: Endoscopy;  Laterality: N/A;    There were no vitals filed for this visit.      Subjective Assessment - 09/03/15 1250    Subjective I have been suffering with my esophogus.  I have been drinking alot of fluid. I had to have my esophogu to be stretched. The incontinence is better during the day but at night it is bad.  When I get up in the middle of the night I am not able to make it to the bathroom.    Patient Stated Goals make it to the bathroom without bowel  leakage, reduce urinary leakage   Currently in Pain? 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 hip flexion PROM 90 degrees   Strength   Overall Strength Comments abdominal strenth 2/5   Right Hip Flexion 3+/5   Right Hip Extension 3+/5   Right Hip ABduction 4/5   Left Hip Flexion 3+/5   Left Hip Extension 3+/5   Left Hip ABduction 4/5   Timed Up and Go Test   Normal TUG (seconds) 18  16 sec, 17 sec      g-code: Functional assessment FOTO score is 92% limitation; Functional limitation other PT primary;Goal is CL; Discharge status is CM Earlie Counts, PT 10/06/2015 9:55 AM                           PT Education - 09/03/15 1320    Education provided Yes   Education Details pelvic floor contraction in sidely   Person(s) Educated Patient   Methods Explanation;Demonstration;Verbal cues;Handout   Comprehension Returned demonstration;Verbalized understanding          PT Short Term Goals - 09/03/15 1323    PT SHORT TERM GOAL #1   Title understand what bladder irritants are   Time 4   Period Weeks   Status Achieved   PT SHORT TERM GOAL #2   Title understand correct toileting technique   Time 4   Period Weeks   Status Achieved   PT SHORT TERM GOAL #3   Title  fecal incontience decreased >/= 25%   Time 4   Period Weeks   Status Achieved   PT SHORT TERM GOAL #4   Title timed up and go score </= 13 seconds   Status On-going  18 sec           PT Long Term  Goals - 07/14/15 1616    PT LONG TERM GOAL #1   Title independent with HEP and how to progress herself   Time 8   Period Weeks   Status New   PT LONG TERM GOAL #2   Title use </= 1 pad per day due to reduction of incontinence >/= 70%   Time 8   Period Weeks   Status New   PT LONG TERM GOAL #3   Title sit to stand 5 tims </= 12 seconds due to improved balance   Time 8   Period Weeks   Status New   PT LONG TERM GOAL #4   Title able to pass gas with fecal incontinence decreased >/= 70%   Time 8   Period Weeks   Status New   PT LONG TERM GOAL #5   Title pelvic floor strength >/= 3/5   Time 8   Period Weeks   Status New               Plan - 09/03/15 1325    Clinical Impression Statement Patient is a 60 year old female with dianosis of fecal incontinence.  Patient fecal incontinence during day has improved by 25%.  Patient reports at night when she gets out of bed she will have an acident.  Patient will also have more difficulty at end of day due to pelvic floor is weak.  Patient is learning exericses in sidely to reduce strain on her back.  Patient has met her STG's.  Patient continues to have weak hips and core.  Patient will benefit form physical therapy to improve pelvic floor strength.    Rehab Potential Excellent   Clinical Impairments Affecting Rehab Potential None   PT Frequency 1x / week   PT Duration 8 weeks   PT Treatment/Interventions Biofeedback;Advice worker;Therapeutic activities;Therapeutic exercise;Balance training;Patient/family education;Manual techniques;Energy conservation;Passive range of motion   PT Next Visit Plan pelvic EMG   PT Home Exercise Plan hip stretches   Consulted and Agree with Plan of Care Patient      Patient  will benefit from skilled therapeutic intervention in order to improve the following deficits and impairments:  Abnormal gait, Decreased mobility, Decreased balance, Decreased strength, Decreased range of motion, Decreased endurance  Visit Diagnosis: Muscle weakness (generalized) - Plan: PT plan of care cert/re-cert  Fecal incontinence - Plan: PT plan of care cert/re-cert     Problem List Patient Active Problem List   Diagnosis Date Noted  . Dysphagia   . Schatzki's ring   . Diarrhea 06/01/2015  . Lumbar radiculopathy 03/05/2015  . Abdominal bloating 02/02/2015  . Stress fracture of foot 01/28/2015  . Peripheral neuropathy ([place]) 01/17/2015  . Foot callus 01/16/2015  . Cystitis 12/14/2014  . Postoperative anemia due to acute blood loss 08/08/2014  . Lumbosacral spondylosis without myelopathy 08/10/2013  . Generalized pain 02/21/2011  . Fibromyalgia 02/21/2011  . Essential hypertension 12/04/2006  . Hx of diabetes mellitus 10/20/2006  . Morbid obesity ([place]) 10/20/2006  . ANXIETY 10/20/2006  . MDD (major depressive disorder) ([place]) 10/20/2006  . OBSTRUCTIVE SLEEP APNEA 10/20/2006  . OSTEOARTHRITIS 10/20/2006  . DVT, HX OF 10/20/2006    Earlie Counts, PT 09/03/2015 1:34 PM    [facility] [address], [place], [place], [address] Phone: [phone]   Fax:  [phone]  Name: JARICA PLASS MRN: [id number] Date of Birth: 03/16/1956  PHYSICAL THERAPY DISCHARGE SUMMARY  Visits from Start of Care: 4  Current functional level related to goals / functional outcomes: See above. Patient did not return to therapy. Patient called to cancel appointment due to back pain.    Remaining deficits: Unable to assess patient due to her not returning to therapy.    Education / Equipment: HEP Plan:                                                    Patient goals were not met. Patient is being discharged due to                                                      Thank you for the referral. Earlie Counts, PT 10/06/2015 9:58 AM  ?????

## 2015-09-03 NOTE — Patient Instructions (Signed)
Adduction: Hip - Knees Together (Side-Lying)    Lie on left side, hips and knees slightly bent, towel roll between knees. Push knees together. Hold for _4__ seconds. Rest for _4__ seconds. Repeat _10__ times. Do 4___ times a day.   Copyright  VHI. All rights reserved.  External Rotation: Hip - Knees Apart (Side-Lying)    Lie on left side with hips and knees slightly bent, band tied just above knees. Pull knees apart. Hold for _4__ seconds. Rest for 4___ seconds. Repeat _10__ times. Do 4___ times a day.   Copyright  VHI. All rights reserved.  [facility] [address] [place], [place] [address] Phone # [phone] Fax [phone]

## 2015-09-10 ENCOUNTER — Encounter: Payer: Commercial Managed Care - HMO | Admitting: Physical Therapy

## 2015-09-17 ENCOUNTER — Ambulatory Visit: Payer: Commercial Managed Care - HMO | Admitting: Physical Therapy

## 2015-10-02 ENCOUNTER — Telehealth: Payer: Self-pay | Admitting: Gastroenterology

## 2015-10-02 NOTE — Telephone Encounter (Signed)
[facility] patient. She states she is returning our call. i cannot find anything where we called her.

## 2015-10-02 NOTE — Telephone Encounter (Signed)
Appt scheduled with Dr. Carlean Purl for 12-15-15.  Pt aware

## 2015-10-02 NOTE — Telephone Encounter (Signed)
[name] this pt has returned call to set up colon

## 2015-10-29 ENCOUNTER — Other Ambulatory Visit: Payer: Self-pay | Admitting: *Deleted

## 2015-10-29 MED ORDER — GLUCOSE BLOOD VI STRP: 1.0000 | ORAL_STRIP | Freq: Two times a day (BID) | Status: DC

## 2015-10-29 MED ORDER — ACCU-CHEK SOFTCLIX LANCETS MISC: 1.0000 | Freq: Two times a day (BID) | Status: DC

## 2015-10-29 MED ORDER — ALCOHOL SWABS 70 % PADS: MEDICATED_PAD | Status: DC

## 2015-10-29 MED ORDER — ACCU-CHEK AVIVA VI SOLN: Status: DC

## 2015-10-29 MED ORDER — ACCU-CHEK AVIVA PLUS W/DEVICE KIT: PACK | Status: DC

## 2015-10-29 NOTE — Addendum Note (Signed)
Addended by: Earnstine Regal on: 10/29/2015 11:08 AM   Modules accepted: Orders, Medications

## 2015-11-13 ENCOUNTER — Telehealth: Payer: Self-pay | Admitting: Internal Medicine

## 2015-11-13 NOTE — Telephone Encounter (Signed)
Patient Name: [name] DOB: [birth year] Initial Comment Caller states she fell Sunday and hit head pretty hard. She has been having headaches and not sleeping well since. Nurse Assessment Nurse: Ronnald Ramp, RN, Miranda Date/Time (Eastern Time): 11/13/2015 10:33:29 AM Confirm and document reason for call. If symptomatic, describe symptoms. You must click the next button to save text entered. ---Caller states on Sunday she fell down some stairs and hit her head on the ground. She has been having light headaches and trouble sleeping. Has the patient traveled out of the country within the last 30 days? ---Not Applicable Does the patient have any new or worsening symptoms? ---Yes Will a triage be completed? ---Yes Related visit to physician within the last 2 weeks? ---No Does the PT have any chronic conditions? (i.e. diabetes, asthma, etc.) ---Yes List chronic conditions. ---HTN Is this a behavioral health or substance abuse call? ---No Guidelines Guideline Title Affirmed Question Affirmed Notes Head Injury [1] After 72 hours AND [2] headache persists Final Disposition User See PCP When Office is Open (within 3 days) Ronnald Ramp, RN, Miranda Comments Appt scheduled for tomorrow 7/28 at 1:45pm with PCP Referrals REFERRED TO PCP OFFICE Disagree/Comply: Comply

## 2015-11-14 ENCOUNTER — Ambulatory Visit (INDEPENDENT_AMBULATORY_CARE_PROVIDER_SITE_OTHER)
Admission: RE | Admit: 2015-11-14 | Discharge: 2015-11-14 | Disposition: A | Discharge status: Home / Self Care | Payer: Commercial Managed Care - HMO | Source: Ambulatory Visit | Attending: Internal Medicine | Admitting: Internal Medicine

## 2015-11-14 ENCOUNTER — Other Ambulatory Visit: Payer: Self-pay | Admitting: Internal Medicine

## 2015-11-14 ENCOUNTER — Encounter: Payer: Self-pay | Admitting: Internal Medicine

## 2015-11-14 ENCOUNTER — Ambulatory Visit (INDEPENDENT_AMBULATORY_CARE_PROVIDER_SITE_OTHER): Payer: Commercial Managed Care - HMO | Admitting: Internal Medicine

## 2015-11-14 VITALS — BP 154/98 | HR 71 | Temp 98.9°F | Resp 18 | Ht 67.5 in | Wt 238.0 lb

## 2015-11-14 DIAGNOSIS — M25572 Pain in left ankle and joints of left foot: Secondary | ICD-10-CM | POA: Diagnosis not present

## 2015-11-14 DIAGNOSIS — M549 Dorsalgia, unspecified: Secondary | ICD-10-CM | POA: Diagnosis not present

## 2015-11-14 DIAGNOSIS — R109 Unspecified abdominal pain: Secondary | ICD-10-CM

## 2015-11-14 DIAGNOSIS — S3992XA Unspecified injury of lower back, initial encounter: Secondary | ICD-10-CM | POA: Diagnosis not present

## 2015-11-14 DIAGNOSIS — M545 Low back pain: Secondary | ICD-10-CM | POA: Diagnosis not present

## 2015-11-14 DIAGNOSIS — M47817 Spondylosis without myelopathy or radiculopathy, lumbosacral region: Secondary | ICD-10-CM

## 2015-11-14 DIAGNOSIS — G44311 Acute post-traumatic headache, intractable: Secondary | ICD-10-CM | POA: Diagnosis not present

## 2015-11-14 DIAGNOSIS — M546 Pain in thoracic spine: Secondary | ICD-10-CM | POA: Diagnosis not present

## 2015-11-14 DIAGNOSIS — S299XXA Unspecified injury of thorax, initial encounter: Secondary | ICD-10-CM | POA: Diagnosis not present

## 2015-11-14 DIAGNOSIS — G44309 Post-traumatic headache, unspecified, not intractable: Secondary | ICD-10-CM | POA: Insufficient documentation

## 2015-11-14 MED ORDER — TRAMADOL HCL 50 MG PO TABS: 50.0000 mg | ORAL_TABLET | Freq: Four times a day (QID) | ORAL | 0 refills | Status: DC

## 2015-11-14 MED ORDER — BACLOFEN 10 MG PO TABS: 10.0000 mg | ORAL_TABLET | Freq: Three times a day (TID) | ORAL | 0 refills | Status: DC

## 2015-11-14 NOTE — Patient Instructions (Signed)
We will get the scan of the head and the x-rays of the back.   We have sent in baclofen which is a muscle relaxer you can use for the side and back pain.  It is okay to keep using phenergan. We have given you the tramadol prescription.

## 2015-11-14 NOTE — Progress Notes (Signed)
   Subjective:    Patient ID: [name], female    DOB: 1955/07/10, 60 y.o.   MRN: [id number]  HPI The patient is a 60 YO female coming in for fall on Sunday. She is having headaches and nausea and blurred vision since that time. Denies LOC but did hit her head directly. She went down some steps. She did not seek care at the time of the fall. Left ankle and right side pain as well. Icing it and taking tramadol and tylenol with some relief. She is still having headaches which have not improved and her vision is slightly blurred as well. Decreased appetite and using phenergan for the nausea she had leftover. Able to walk on the ankle. Pain with twisting her abdomen and in her back. No pain with eating or diarrhea or constipation.   Review of Systems  Constitutional: Positive for activity change, appetite change and fatigue. Negative for fever and unexpected weight change.  Respiratory: Negative for cough, chest tightness, shortness of breath and wheezing.   Cardiovascular: Negative for chest pain, palpitations and leg swelling.  Gastrointestinal: Positive for abdominal distention, abdominal pain and nausea. Negative for blood in stool, constipation, diarrhea and vomiting.  Musculoskeletal: Positive for arthralgias, back pain, gait problem and myalgias.  Skin: Negative.   Neurological: Positive for weakness, numbness and headaches. Negative for dizziness and syncope.       Chronic in her feet  Psychiatric/Behavioral: Negative.       Objective:   Physical Exam  Constitutional: She is oriented to person, place, and time. She appears well-developed and well-nourished.  HENT:  Head: Normocephalic and atraumatic.  No neck pain or temporal pain. Pain is located on the left temporal region where her head made contact with the ground.   Eyes: EOM are normal.  Neck: Normal range of motion. No JVD present.  Cardiovascular: Normal rate and regular rhythm.   Pulmonary/Chest: Effort normal. No  respiratory distress. She has no wheezes. She has no rales.  Abdominal: Soft. Bowel sounds are normal. She exhibits no distension and no mass. There is tenderness. There is no rebound and no guarding.  Pain with palpation to the right flank and no rebound or guarding.    Musculoskeletal: She exhibits tenderness. She exhibits no edema.  Pain diffuse in the thoracic and lumbar region, prior fusion in the lumbar region.   Lymphadenopathy:    She has no cervical adenopathy.  Neurological: She is alert and oriented to person, place, and time. Coordination abnormal.  Slow gait  Skin: Skin is warm and dry.  Left ankle with some swelling and pain to palpation, no obvious fracture.    Vitals:   11/14/15 1354  BP: (!) 154/98  Pulse: 71  Resp: 18  Temp: 98.9 F (37.2 C)  TempSrc: Oral  SpO2: 98%  Weight: 238 lb (108 kg)  Height: 5' 7.5" (1.715 m)      Assessment & Plan:

## 2015-11-14 NOTE — Progress Notes (Signed)
Pre visit review using our clinic review tool, if applicable. No additional management support is needed unless otherwise documented below in the visit note. 

## 2015-11-14 NOTE — Assessment & Plan Note (Signed)
Rx for baclofen as this is likely muscular pain. No rebound or guarding of the abdomen to suggest visceral etiology.

## 2015-11-14 NOTE — Assessment & Plan Note (Signed)
Needs x-ray lumbar and thoracic to rule out fracture as well as new instability of her lumbar fusion within the last 1-2 years.

## 2015-11-14 NOTE — Assessment & Plan Note (Signed)
Advised this is likely sprained as she is walking on it and swelling is minimal. Advised to continue ice, rest.

## 2015-11-14 NOTE — Assessment & Plan Note (Signed)
Checking tat CT head since she has not had evaluation. Likely a concussion but need to rule out bleeding.

## 2015-11-17 ENCOUNTER — Ambulatory Visit (INDEPENDENT_AMBULATORY_CARE_PROVIDER_SITE_OTHER)
Admission: RE | Admit: 2015-11-17 | Discharge: 2015-11-17 | Disposition: A | Discharge status: Home / Self Care | Payer: Commercial Managed Care - HMO | Source: Ambulatory Visit | Attending: Internal Medicine | Admitting: Internal Medicine

## 2015-11-17 DIAGNOSIS — G44311 Acute post-traumatic headache, intractable: Secondary | ICD-10-CM | POA: Diagnosis not present

## 2015-11-17 DIAGNOSIS — R51 Headache: Secondary | ICD-10-CM | POA: Diagnosis not present

## 2015-12-02 NOTE — Progress Notes (Deleted)
[name] [place] [place], [place] [address] Phone: [phone] Subjective:    I'm seeing this patient by the request  of:    CC: Right leg pain  [id number]  [name] is a 60 y.o. female coming in with complaint of Right leg pain. Patient states she fell.     Past Medical History:  Diagnosis Date  . Anemia    takes Ferrous Sulfate daily  . Anxiety    takes Citalopram daily  . Arthritis   . Asthma    2004-prior to gastric bypass and no problems since  . CHF (congestive heart failure) (HCC)    takes Lasix daily as needed  . Chronic back pain    spondylolisthesis/stenosis/radiculopathy  . Complication of anesthesia yrs ago   slow to wake up  . Depression   . Diabetes ([facility])   . DVT (deep venous thrombosis) ([facility]) 01-17-13   past hx. -tx.5-6 yrs ago bilateral legs, occ. sporadic swelling, has IVC filter implanted  . Dysrhythmia    "heart tends to flutter"  . Fibromyalgia   . Fracture    right foot and is in a cam boot  . Gallstones   . GERD (gastroesophageal reflux disease)    hx of-no meds now  . Heart murmur   . History of bronchitis 2012 or 2013  . History of colon polyps   . Hypertension    takes Metoprolol daily  . Insomnia    takes Melatonin daily  . Joint pain   . Joint swelling   . Pelvic floor dysfunction   . Peripheral neuropathy ([place])   . Pneumonia 90's   hx of  . Sleep apnea    no cpap used in many yrs after weight lost-no machine now  . Urinary frequency   . Urinary urgency    Past Surgical History:  Procedure Laterality Date  . BALLOON DILATION N/A 01/31/2013   Procedure: BALLOON DILATION;  Surgeon: Arta Silence, MD;  Location: [facility];  Service: Endoscopy;  Laterality: N/A;  . CHOLECYSTECTOMY  2007  . COLONOSCOPY    . DILATION AND CURETTAGE OF UTERUS  yrs ago  . ESOPHAGOGASTRODUODENOSCOPY (EGD) WITH PROPOFOL  03/01/2012   Procedure: ESOPHAGOGASTRODUODENOSCOPY (EGD) WITH PROPOFOL;   Surgeon: Arta Silence, MD;  Location: [facility];  Service: Endoscopy;  Laterality: N/A;  . ESOPHAGOGASTRODUODENOSCOPY (EGD) WITH PROPOFOL N/A 01/31/2013   Procedure: ESOPHAGOGASTRODUODENOSCOPY (EGD) WITH PROPOFOL;  Surgeon: Arta Silence, MD;  Location: [facility];  Service: Endoscopy;  Laterality: N/A;  . ESOPHAGOGASTRODUODENOSCOPY (EGD) WITH PROPOFOL N/A 09/02/2015   Procedure: ESOPHAGOGASTRODUODENOSCOPY (EGD) WITH PROPOFOL;  Surgeon: Gatha Mayer, MD;  Location: [facility];  Service: Endoscopy;  Laterality: N/A;  . GASTRIC BY-PASS  2004  . HERNIA REPAIR  2005  . INSERTION OF VENA CAVA FILTER  01-17-13   inserted 2004- "abdomen"  . LIGAMENT REPAIR Right 1987   Rt. knee scope  . MAXIMUM ACCESS (MAS)POSTERIOR LUMBAR INTERBODY FUSION (PLIF) 2 LEVEL N/A 06/07/2014   Procedure: L4-5 L5-S1 FOR MAXIMUM ACCESS (MAS) POSTERIOR LUMBAR INTERBODY FUSION ;  Surgeon: Erline Levine, MD;  Location: [facility];  Service: Neurosurgery;  Laterality: N/A;  L4-5 L5-S1 FOR MAXIMUM ACCESS (MAS) POSTERIOR LUMBAR INTERBODY FUSION   . TONSILLECTOMY     as child   Social History   Social History  . Marital status: Widowed    Spouse name: N/A  . Number of children: 0  . Years of education: college   Occupational History  .  retired    Social History Main Topics  . Smoking status: Former Smoker    Packs/day: 0.50    Years: 10.00    Types: Cigarettes    Quit date: 04/19/2009  . Smokeless tobacco: Never Used  . Alcohol use 0.0 oz/week     Comment: occ. social- wine-1 drink monthly  . Drug use: No  . Sexual activity: Not Currently   Other Topics Concern  . Not on file   Social History Narrative  . No narrative on file   Allergies  Allergen Reactions  . Carafate [Sucralfate] Hives    Other reaction(s): Angioedema (ALLERGY/intolerance) hives scratching  . Sulfonamide Derivatives Rash    REACTION: Syncope   Family History  Problem Relation Age of Onset  . Diabetes Mother   . Heart  disease Father   . Hypertension Father   . Kidney disease Brother   . Colon cancer Neg Hx   . Colon polyps Neg Hx   . Stomach cancer Neg Hx   . Esophageal cancer Neg Hx   . Pancreatic cancer Neg Hx   . Liver disease Neg Hx     Past medical history, social, surgical and family history all reviewed in electronic medical record.  No pertanent information unless stated regarding to the chief complaint.   Review of Systems: No headache, visual changes, nausea, vomiting, diarrhea, constipation, dizziness, abdominal pain, skin rash, fevers, chills, night sweats, weight loss, swollen lymph nodes, body aches, joint swelling, muscle aches, chest pain, shortness of breath, mood changes.   Objective  There were no vitals taken for this visit.  General: No apparent distress alert and oriented x3 mood and affect normal, dressed appropriately.  HEENT: Pupils equal, extraocular movements intact  Respiratory: Patient's speak in full sentences and does not appear short of breath  Cardiovascular: No lower extremity edema, non tender, no erythema  Skin: Warm dry intact with no signs of infection or rash on extremities or on axial skeleton.  Abdomen: Soft nontender  Neuro: Cranial nerves II through XII are intact, neurovascularly intact in all extremities with 2+ DTRs and 2+ pulses.  Lymph: No lymphadenopathy of posterior or anterior cervical chain or axillae bilaterally.  Gait normal with good balance and coordination.  MSK:  Non tender with full range of motion and good stability and symmetric strength and tone of shoulders, elbows, wrist, hip, knee and ankles bilaterally.     Impression and Recommendations:     This case required medical decision making of moderate complexity.      Note: This dictation was prepared with Dragon dictation along with smaller phrase technology. Any transcriptional errors that result from this process are unintentional.

## 2015-12-03 ENCOUNTER — Ambulatory Visit: Payer: Commercial Managed Care - HMO | Admitting: Family Medicine

## 2015-12-09 DIAGNOSIS — H524 Presbyopia: Secondary | ICD-10-CM | POA: Diagnosis not present

## 2015-12-09 DIAGNOSIS — H5210 Myopia, unspecified eye: Secondary | ICD-10-CM | POA: Diagnosis not present

## 2015-12-09 LAB — HM DIABETES EYE EXAM

## 2015-12-10 ENCOUNTER — Ambulatory Visit: Payer: Self-pay | Admitting: Family Medicine

## 2015-12-11 ENCOUNTER — Ambulatory Visit (INDEPENDENT_AMBULATORY_CARE_PROVIDER_SITE_OTHER): Payer: Commercial Managed Care - HMO | Admitting: Internal Medicine

## 2015-12-11 ENCOUNTER — Other Ambulatory Visit (INDEPENDENT_AMBULATORY_CARE_PROVIDER_SITE_OTHER): Payer: Commercial Managed Care - HMO

## 2015-12-11 ENCOUNTER — Encounter: Payer: Self-pay | Admitting: Internal Medicine

## 2015-12-11 VITALS — BP 138/82 | HR 96 | Temp 98.6°F | Resp 20 | Ht 67.5 in | Wt 240.0 lb

## 2015-12-11 DIAGNOSIS — Z1159 Encounter for screening for other viral diseases: Secondary | ICD-10-CM

## 2015-12-11 DIAGNOSIS — Z114 Encounter for screening for human immunodeficiency virus [HIV]: Secondary | ICD-10-CM | POA: Diagnosis not present

## 2015-12-11 DIAGNOSIS — Z0000 Encounter for general adult medical examination without abnormal findings: Secondary | ICD-10-CM

## 2015-12-11 DIAGNOSIS — I10 Essential (primary) hypertension: Secondary | ICD-10-CM

## 2015-12-11 DIAGNOSIS — Z8639 Personal history of other endocrine, nutritional and metabolic disease: Secondary | ICD-10-CM | POA: Diagnosis not present

## 2015-12-11 LAB — COMPREHENSIVE METABOLIC PANEL
ALBUMIN: 3.7 g/dL (ref 3.5–5.2)
ALT: 26 U/L (ref 0–35)
AST: 21 U/L (ref 0–37)
Alkaline Phosphatase: 102 U/L (ref 39–117)
BUN: 13 mg/dL (ref 6–23)
CHLORIDE: 116 meq/L — AB (ref 96–112)
CO2: 21 mEq/L (ref 19–32)
Calcium: 8.3 mg/dL — ABNORMAL LOW (ref 8.4–10.5)
Creatinine, Ser: 0.91 mg/dL (ref 0.40–1.20)
GFR: 80.51 mL/min (ref >60.00)
Glucose, Bld: 83 mg/dL (ref 70–99)
POTASSIUM: 3.9 meq/L (ref 3.5–5.1)
SODIUM: 142 meq/L (ref 135–145)
Total Bilirubin: 0.2 mg/dL (ref 0.2–1.2)
Total Protein: 6.6 g/dL (ref 6.0–8.3)

## 2015-12-11 LAB — LIPID PANEL
CHOLESTEROL: 149 mg/dL (ref 0–200)
HDL: 76.8 mg/dL (ref >39.00)
LDL CALC: 60 mg/dL (ref 0–99)
NonHDL: 72.55
Total CHOL/HDL Ratio: 2
Triglycerides: 62 mg/dL (ref 0.0–149.0)
VLDL: 12.4 mg/dL (ref 0.0–40.0)

## 2015-12-11 LAB — URINALYSIS, ROUTINE W REFLEX MICROSCOPIC
Bilirubin Urine: NEGATIVE
Hgb urine dipstick: NEGATIVE
Ketones, ur: NEGATIVE
Leukocytes, UA: NEGATIVE
Nitrite: NEGATIVE
SPECIFIC GRAVITY, URINE: 1.01 (ref 1.000–1.030)
Total Protein, Urine: NEGATIVE
URINE GLUCOSE: NEGATIVE
Urobilinogen, UA: 0.2 (ref 0.0–1.0)
WBC UA: NONE SEEN (ref 0–?)
pH: 6 (ref 5.0–8.0)

## 2015-12-11 LAB — HEMOGLOBIN A1C: HEMOGLOBIN A1C: 5.2 % (ref 4.6–6.5)

## 2015-12-11 NOTE — Patient Instructions (Signed)
We have given you the flu shot today.   Call the insurance company to see if they will pay for the shingles shot and we can do it at the office.   We are checking the labs today.   Health Maintenance, Female Adopting a healthy lifestyle and getting preventive care can go a long way to promote health and wellness. Talk with your health care provider about what schedule of regular examinations is right for you. This is a good chance for you to check in with your provider about disease prevention and staying healthy. In between checkups, there are plenty of things you can do on your own. Experts have done a lot of research about which lifestyle changes and preventive measures are most likely to keep you healthy. Ask your health care provider for more information. WEIGHT AND DIET  Eat a healthy diet  Be sure to include plenty of vegetables, fruits, low-fat dairy products, and lean protein.  Do not eat a lot of foods high in solid fats, added sugars, or salt.  Get regular exercise. This is one of the most important things you can do for your health.  Most adults should exercise for at least 150 minutes each week. The exercise should increase your heart rate and make you sweat (moderate-intensity exercise).  Most adults should also do strengthening exercises at least twice a week. This is in addition to the moderate-intensity exercise.  Maintain a healthy weight  Body mass index (BMI) is a measurement that can be used to identify possible weight problems. It estimates body fat based on height and weight. Your health care provider can help determine your BMI and help you achieve or maintain a healthy weight.  For females 67 years of age and older:   A BMI below 18.5 is considered underweight.  A BMI of 18.5 to 24.9 is normal.  A BMI of 25 to 29.9 is considered overweight.  A BMI of 30 and above is considered obese.  Watch levels of cholesterol and blood lipids  You should start having  your blood tested for lipids and cholesterol at 62 years of age, then have this test every 5 years.  You may need to have your cholesterol levels checked more often if:  Your lipid or cholesterol levels are high.  You are older than 62 years of age.  You are at high risk for heart disease.  CANCER SCREENING   Lung Cancer  Lung cancer screening is recommended for adults 30-94 years old who are at high risk for lung cancer because of a history of smoking.  A yearly low-dose CT scan of the lungs is recommended for people who:  Currently smoke.  Have quit within the past 15 years.  Have at least a 30-pack-year history of smoking. A pack year is smoking an average of one pack of cigarettes a day for 1 year.  Yearly screening should continue until it has been 15 years since you quit.  Yearly screening should stop if you develop a health problem that would prevent you from having lung cancer treatment.  Breast Cancer  Practice breast self-awareness. This means understanding how your breasts normally appear and feel.  It also means doing regular breast self-exams. Let your health care provider know about any changes, no matter how small.  If you are in your 20s or 30s, you should have a clinical breast exam (CBE) by a health care provider every 1-3 years as part of a regular health exam.  If you are 40 or older, have a CBE every year. Also consider having a breast X-ray (mammogram) every year.  If you have a family history of breast cancer, talk to your health care provider about genetic screening.  If you are at high risk for breast cancer, talk to your health care provider about having an MRI and a mammogram every year.  Breast cancer gene (BRCA) assessment is recommended for women who have family members with BRCA-related cancers. BRCA-related cancers include:  Breast.  Ovarian.  Tubal.  Peritoneal cancers.  Results of the assessment will determine the need for genetic  counseling and BRCA1 and BRCA2 testing. Cervical Cancer Your health care provider may recommend that you be screened regularly for cancer of the pelvic organs (ovaries, uterus, and vagina). This screening involves a pelvic examination, including checking for microscopic changes to the surface of your cervix (Pap test). You may be encouraged to have this screening done every 3 years, beginning at age 60.  For women ages 62-65, health care providers may recommend pelvic exams and Pap testing every 3 years, or they may recommend the Pap and pelvic exam, combined with testing for human papilloma virus (HPV), every 5 years. Some types of HPV increase your risk of cervical cancer. Testing for HPV may also be done on women of any age with unclear Pap test results.  Other health care providers may not recommend any screening for nonpregnant women who are considered low risk for pelvic cancer and who do not have symptoms. Ask your health care provider if a screening pelvic exam is right for you.  If you have had past treatment for cervical cancer or a condition that could lead to cancer, you need Pap tests and screening for cancer for at least 20 years after your treatment. If Pap tests have been discontinued, your risk factors (such as having a new sexual partner) need to be reassessed to determine if screening should resume. Some women have medical problems that increase the chance of getting cervical cancer. In these cases, your health care provider may recommend more frequent screening and Pap tests. Colorectal Cancer  This type of cancer can be detected and often prevented.  Routine colorectal cancer screening usually begins at 62 years of age and continues through 62 years of age.  Your health care provider may recommend screening at an earlier age if you have risk factors for colon cancer.  Your health care provider may also recommend using home test kits to check for hidden blood in the stool.  A  small camera at the end of a tube can be used to examine your colon directly (sigmoidoscopy or colonoscopy). This is done to check for the earliest forms of colorectal cancer.  Routine screening usually begins at age 40.  Direct examination of the colon should be repeated every 5-10 years through 62 years of age. However, you may need to be screened more often if early forms of precancerous polyps or small growths are found. Skin Cancer  Check your skin from head to toe regularly.  Tell your health care provider about any new moles or changes in moles, especially if there is a change in a mole's shape or color.  Also tell your health care provider if you have a mole that is larger than the size of a pencil eraser.  Always use sunscreen. Apply sunscreen liberally and repeatedly throughout the day.  Protect yourself by wearing long sleeves, pants, a wide-brimmed hat, and sunglasses whenever you  are outside. HEART DISEASE, DIABETES, AND HIGH BLOOD PRESSURE   High blood pressure causes heart disease and increases the risk of stroke. High blood pressure is more likely to develop in:  People who have blood pressure in the high end of the normal range (130-139/85-89 mm Hg).  People who are overweight or obese.  People who are African American.  If you are 43-36 years of age, have your blood pressure checked every 3-5 years. If you are 76 years of age or older, have your blood pressure checked every year. You should have your blood pressure measured twice--once when you are at a hospital or clinic, and once when you are not at a hospital or clinic. Record the average of the two measurements. To check your blood pressure when you are not at a hospital or clinic, you can use:  An automated blood pressure machine at a pharmacy.  A home blood pressure monitor.  If you are between 44 years and 62 years old, ask your health care provider if you should take aspirin to prevent strokes.  Have  regular diabetes screenings. This involves taking a blood sample to check your fasting blood sugar level.  If you are at a normal weight and have a low risk for diabetes, have this test once every three years after 62 years of age.  If you are overweight and have a high risk for diabetes, consider being tested at a younger age or more often. PREVENTING INFECTION  Hepatitis B  If you have a higher risk for hepatitis B, you should be screened for this virus. You are considered at high risk for hepatitis B if:  You were born in a country where hepatitis B is common. Ask your health care provider which countries are considered high risk.  Your parents were born in a high-risk country, and you have not been immunized against hepatitis B (hepatitis B vaccine).  You have HIV or AIDS.  You use needles to inject street drugs.  You live with someone who has hepatitis B.  You have had sex with someone who has hepatitis B.  You get hemodialysis treatment.  You take certain medicines for conditions, including cancer, organ transplantation, and autoimmune conditions. Hepatitis C  Blood testing is recommended for:  Everyone born from [birth year] through 1967.  Anyone with known risk factors for hepatitis C. Sexually transmitted infections (STIs)  You should be screened for sexually transmitted infections (STIs) including gonorrhea and chlamydia if:  You are sexually active and are younger than 62 years of age.  You are older than 62 years of age and your health care provider tells you that you are at risk for this type of infection.  Your sexual activity has changed since you were last screened and you are at an increased risk for chlamydia or gonorrhea. Ask your health care provider if you are at risk.  If you do not have HIV, but are at risk, it may be recommended that you take a prescription medicine daily to prevent HIV infection. This is called pre-exposure prophylaxis (PrEP). You are  considered at risk if:  You are sexually active and do not regularly use condoms or know the HIV status of your partner(s).  You take drugs by injection.  You are sexually active with a partner who has HIV. Talk with your health care provider about whether you are at high risk of being infected with HIV. If you choose to begin PrEP, you should first be tested for  HIV. You should then be tested every 3 months for as long as you are taking PrEP.  PREGNANCY   If you are premenopausal and you may become pregnant, ask your health care provider about preconception counseling.  If you may become pregnant, take 400 to 800 micrograms (mcg) of folic acid every day.  If you want to prevent pregnancy, talk to your health care provider about birth control (contraception). OSTEOPOROSIS AND MENOPAUSE   Osteoporosis is a disease in which the bones lose minerals and strength with aging. This can result in serious bone fractures. Your risk for osteoporosis can be identified using a bone density scan.  If you are 80 years of age or older, or if you are at risk for osteoporosis and fractures, ask your health care provider if you should be screened.  Ask your health care provider whether you should take a calcium or vitamin D supplement to lower your risk for osteoporosis.  Menopause may have certain physical symptoms and risks.  Hormone replacement therapy may reduce some of these symptoms and risks. Talk to your health care provider about whether hormone replacement therapy is right for you.  HOME CARE INSTRUCTIONS   Schedule regular health, dental, and eye exams.  Stay current with your immunizations.   Do not use any tobacco products including cigarettes, chewing tobacco, or electronic cigarettes.  If you are pregnant, do not drink alcohol.  If you are breastfeeding, limit how much and how often you drink alcohol.  Limit alcohol intake to no more than 1 drink per day for nonpregnant women. One  drink equals 12 ounces of beer, 5 ounces of wine, or 1 ounces of hard liquor.  Do not use street drugs.  Do not share needles.  Ask your health care provider for help if you need support or information about quitting drugs.  Tell your health care provider if you often feel depressed.  Tell your health care provider if you have ever been abused or do not feel safe at home.   This information is not intended to replace advice given to you by your health care provider. Make sure you discuss any questions you have with your health care provider.   Document Released: 10/19/2010 Document Revised: 04/26/2014 Document Reviewed: 03/07/2013 Elsevier Interactive Patient Education [employer].

## 2015-12-11 NOTE — Progress Notes (Signed)
Pre visit review using our clinic review tool, if applicable. No additional management support is needed unless otherwise documented below in the visit note. 

## 2015-12-11 NOTE — Progress Notes (Signed)
   Subjective:    Patient ID: [name], female    DOB: 09/06/1955, 60 y.o.   MRN: [id number]  HPI Here for medicare wellness and CPE, no new complaints. Please see A/P for status and treatment of chronic medical problems.   Diet: heart healthy Physical activity: sedentary Depression/mood screen: negative Hearing: intact to whispered voice Visual acuity: grossly normal, performs annual eye exam  ADLs: capable Fall risk: high (2 falls in the last 3 months) Home safety: good Cognitive evaluation: intact to orientation, naming, recall and repetition EOL planning: adv directives discussed  I have personally reviewed and have noted 1. The patient's medical and social history - reviewed today no changes 2. Their use of alcohol, tobacco or illicit drugs 3. Their current medications and supplements 4. The patient's functional ability including ADL's, fall risks, home safety risks and hearing or visual impairment. 5. Diet and physical activities 6. Evidence for depression or mood disorders 7. Care team reviewed and updated (available in snapshot)  Review of Systems  Constitutional: Positive for activity change and fatigue. Negative for appetite change, fever and unexpected weight change.  HENT: Negative.   Eyes: Negative.   Respiratory: Negative for cough, chest tightness, shortness of breath and wheezing.   Cardiovascular: Negative for chest pain, palpitations and leg swelling.  Gastrointestinal: Positive for abdominal distention and abdominal pain. Negative for blood in stool, constipation, diarrhea, nausea and vomiting.  Musculoskeletal: Positive for arthralgias, back pain, gait problem and myalgias.  Skin: Negative.   Neurological: Positive for weakness and numbness. Negative for dizziness, syncope and headaches.       Chronic in her feet  Psychiatric/Behavioral: Negative.       Objective:   Physical Exam  Constitutional: She is oriented to person, place, and time. She  appears well-developed and well-nourished.  HENT:  Head: Normocephalic and atraumatic.  Eyes: EOM are normal.  Neck: Normal range of motion. No JVD present.  Cardiovascular: Normal rate and regular rhythm.   Pulmonary/Chest: Effort normal. No respiratory distress. She has no wheezes. She has no rales.  Abdominal: Soft. Bowel sounds are normal. She exhibits no distension and no mass. There is no tenderness. There is no rebound and no guarding.  Musculoskeletal: She exhibits tenderness. She exhibits no edema.  Pain diffuse in the thoracic and lumbar region, prior fusion in the lumbar region.   Lymphadenopathy:    She has no cervical adenopathy.  Neurological: She is alert and oriented to person, place, and time. Coordination abnormal.  Slow gait  Skin: Skin is warm and dry.   Vitals:   12/11/15 1338  BP: 138/82  Pulse: 96  Resp: 20  Temp: 98.6 F (37 C)  TempSrc: Oral  SpO2: 98%  Weight: 240 lb (108.9 kg)  Height: 5' 7.5" (1.715 m)      Assessment & Plan:  Flu shot given at visit.

## 2015-12-12 DIAGNOSIS — Z0000 Encounter for general adult medical examination without abnormal findings: Secondary | ICD-10-CM | POA: Insufficient documentation

## 2015-12-12 LAB — HIV ANTIBODY (ROUTINE TESTING W REFLEX): HIV: NONREACTIVE

## 2015-12-12 LAB — HEPATITIS C ANTIBODY: HCV Ab: NEGATIVE

## 2015-12-12 NOTE — Assessment & Plan Note (Signed)
BP at goal on her lasix and metoprolol. Checking CMP and adjust as needed.

## 2015-12-12 NOTE — Assessment & Plan Note (Signed)
She is wanting to work on weight loss seriously to take some pressure off her back.

## 2015-12-12 NOTE — Assessment & Plan Note (Signed)
Tdap due in 2026, flu shot given at visit, she will contact insurance about shingles shot coverage. Checking hiv and hep c screening. Mammogram and colonoscopy up to date (colon due in 2021). Counseled about sun safety and fall risk reduction. Counseled on the dangers of distracted driving. Given 10 year screening recommendations.

## 2015-12-12 NOTE — Assessment & Plan Note (Signed)
Checking Hga1c, foot exam done. Reminded about the need for eye exam. Off meds for some time without sugars in the diabetic range.

## 2015-12-15 ENCOUNTER — Other Ambulatory Visit: Payer: Self-pay | Admitting: Internal Medicine

## 2015-12-15 ENCOUNTER — Ambulatory Visit: Payer: Commercial Managed Care - HMO | Admitting: Internal Medicine

## 2015-12-15 ENCOUNTER — Other Ambulatory Visit: Payer: Self-pay | Admitting: Family Medicine

## 2015-12-15 ENCOUNTER — Other Ambulatory Visit: Payer: Self-pay

## 2015-12-15 MED ORDER — TRAMADOL HCL 50 MG PO TABS: 50.0000 mg | ORAL_TABLET | Freq: Four times a day (QID) | ORAL | 2 refills | Status: DC

## 2015-12-15 MED ORDER — CYANOCOBALAMIN 1000 MCG/ML IJ SOLN: 1000.0000 ug | INTRAMUSCULAR | 0 refills | Status: DC

## 2015-12-15 NOTE — Telephone Encounter (Signed)
Rx faxed to pharmacy  

## 2015-12-26 ENCOUNTER — Encounter: Payer: Self-pay | Admitting: Internal Medicine

## 2015-12-29 ENCOUNTER — Encounter: Payer: Self-pay | Admitting: Internal Medicine

## 2015-12-29 DIAGNOSIS — M549 Dorsalgia, unspecified: Secondary | ICD-10-CM

## 2015-12-31 DIAGNOSIS — M4806 Spinal stenosis, lumbar region: Secondary | ICD-10-CM | POA: Diagnosis not present

## 2015-12-31 DIAGNOSIS — M4316 Spondylolisthesis, lumbar region: Secondary | ICD-10-CM | POA: Diagnosis not present

## 2015-12-31 DIAGNOSIS — M5416 Radiculopathy, lumbar region: Secondary | ICD-10-CM | POA: Diagnosis not present

## 2015-12-31 DIAGNOSIS — M5127 Other intervertebral disc displacement, lumbosacral region: Secondary | ICD-10-CM | POA: Diagnosis not present

## 2016-01-27 ENCOUNTER — Encounter: Payer: Self-pay | Admitting: Internal Medicine

## 2016-02-04 DIAGNOSIS — E119 Type 2 diabetes mellitus without complications: Secondary | ICD-10-CM | POA: Diagnosis not present

## 2016-02-06 ENCOUNTER — Other Ambulatory Visit: Payer: Self-pay | Admitting: Internal Medicine

## 2016-02-11 ENCOUNTER — Encounter: Payer: Self-pay | Admitting: Nurse Practitioner

## 2016-02-11 ENCOUNTER — Ambulatory Visit (INDEPENDENT_AMBULATORY_CARE_PROVIDER_SITE_OTHER)
Admission: RE | Admit: 2016-02-11 | Discharge: 2016-02-11 | Disposition: A | Discharge status: Home / Self Care | Payer: Commercial Managed Care - HMO | Source: Ambulatory Visit | Attending: Nurse Practitioner | Admitting: Nurse Practitioner

## 2016-02-11 ENCOUNTER — Ambulatory Visit (INDEPENDENT_AMBULATORY_CARE_PROVIDER_SITE_OTHER): Payer: Commercial Managed Care - HMO | Admitting: Nurse Practitioner

## 2016-02-11 ENCOUNTER — Other Ambulatory Visit: Payer: Self-pay | Admitting: *Deleted

## 2016-02-11 VITALS — BP 124/78 | HR 88 | Temp 98.4°F | Ht 67.0 in | Wt 243.0 lb

## 2016-02-11 DIAGNOSIS — R05 Cough: Secondary | ICD-10-CM | POA: Diagnosis not present

## 2016-02-11 DIAGNOSIS — J069 Acute upper respiratory infection, unspecified: Secondary | ICD-10-CM

## 2016-02-11 DIAGNOSIS — J9801 Acute bronchospasm: Secondary | ICD-10-CM

## 2016-02-11 DIAGNOSIS — J209 Acute bronchitis, unspecified: Secondary | ICD-10-CM

## 2016-02-11 MED ORDER — BENZONATATE 100 MG PO CAPS: 100.0000 mg | ORAL_CAPSULE | Freq: Three times a day (TID) | ORAL | 0 refills | Status: DC | PRN

## 2016-02-11 MED ORDER — METHYLPREDNISOLONE ACETATE 40 MG/ML IJ SUSP
40.0000 mg | Freq: Once | INTRAMUSCULAR | Status: AC
  Administered 2016-02-11: 40 mg via INTRAMUSCULAR

## 2016-02-11 MED ORDER — ALBUTEROL SULFATE HFA 108 (90 BASE) MCG/ACT IN AERS: 1.0000 | INHALATION_SPRAY | Freq: Four times a day (QID) | RESPIRATORY_TRACT | 2 refills | Status: DC | PRN

## 2016-02-11 MED ORDER — DM-GUAIFENESIN ER 30-600 MG PO TB12: 1.0000 | ORAL_TABLET | Freq: Two times a day (BID) | ORAL | 0 refills | Status: DC | PRN

## 2016-02-11 MED ORDER — FLUTICASONE PROPIONATE 50 MCG/ACT NA SUSP
2.0000 | Freq: Every day | NASAL | 0 refills | Status: DC
Start: 2016-02-11 — End: 2016-08-18

## 2016-02-11 MED ORDER — IPRATROPIUM-ALBUTEROL 0.5-2.5 (3) MG/3ML IN SOLN
3.0000 mL | Freq: Once | RESPIRATORY_TRACT | Status: AC
  Administered 2016-02-11: 3 mL via RESPIRATORY_TRACT

## 2016-02-11 NOTE — Progress Notes (Signed)
Subjective:  Patient ID: [name], female    DOB: 11/19/1955  Age: 60 y.o. MRN: [id number]  CC: URI (Pt stated sore throat, shorthness of breath for 5 days)   URI   This is a new problem. The current episode started in the past 7 days. The problem has been gradually worsening. There has been no fever. Associated symptoms include chest pain, congestion, coughing, rhinorrhea, sinus pain, sneezing, a sore throat, swollen glands and wheezing. Pertinent negatives include no abdominal pain. She has tried acetaminophen, decongestant and antihistamine for the symptoms. The treatment provided mild relief.    Outpatient Medications Prior to Visit  Medication Sig Dispense Refill  . ACCU-CHEK SOFTCLIX LANCETS lancets 1 each by Other route 2 (two) times daily. Use to help check blood sugars twice a day Dx E11.9 200 each 2  . Alcohol Swabs 70 % PADS Use to wipe area to check blood sugars daily Dx E11.9 200 each 2  . aspirin EC 81 MG tablet Take 81 mg by mouth daily.    . baclofen (LIORESAL) 10 MG tablet Take 1 tablet (10 mg total) by mouth 3 (three) times daily. 60 each 0  . BLACK COHOSH PO Take 1 tablet by mouth daily.    . Blood Glucose Calibration (ACCU-CHEK AVIVA) SOLN Use as directed 3 each 3  . Blood Glucose Monitoring Suppl (ACCU-CHEK AVIVA PLUS) w/Device KIT Use as directed to check blood sugars daily Dx E11.9 1 kit 0  . buPROPion (WELLBUTRIN SR) 150 MG 12 hr tablet Take 1 tablet (150 mg total) by mouth 2 (two) times daily. 60 tablet 6  . CALCIUM PO Take 1 tablet by mouth daily. '600mg'$     . CRANBERRY PO Take 1 tablet by mouth daily.    . cyanocobalamin (,VITAMIN B-12,) 1000 MCG/ML injection Inject 1 mL (1,000 mcg total) into the muscle every 30 (thirty) days. 10 mL 0  . ferrous sulfate 325 (65 FE) MG tablet Take 325 mg by mouth daily.     . furosemide (LASIX) 40 MG tablet TAKE 1 TABLET (40 MG TOTAL) BY MOUTH DAILY AS NEEDED FOR EDEMA. 90 tablet 3  . gabapentin (NEURONTIN) 400 MG  capsule TAKE 1 CAPSULE FOUR TIMES DAILY 360 capsule 3  . glucose blood (ACCU-CHEK AVIVA PLUS) test strip 1 each by Other route 2 (two) times daily. Use to check blood sugars twice a day Dx E11.9 200 each 2  . hydrocortisone (ANUSOL-HC) 25 MG suppository Place 1 suppository (25 mg total) rectally every 12 (twelve) hours. 20 suppository 1  . MELATONIN PO Take 1 tablet by mouth at bedtime as needed. For sleep    . metoCLOPramide (REGLAN) 5 MG tablet Take 1 tablet (5 mg total) by mouth every 6 (six) hours as needed for nausea. 90 tablet 3  . metoprolol tartrate (LOPRESSOR) 25 MG tablet TAKE 1/2 TABLET (12.5 MG TOTAL) BY MOUTH 2 (TWO) TIMES DAILY. 90 tablet 3  . Multiple Vitamins-Minerals (MULTIVITAMINS THER. W/MINERALS) TABS Take 1 tablet by mouth daily.     . nitroGLYCERIN (NITROSTAT) 0.4 MG SL tablet Place 0.4 mg under the tongue every 5 (five) minutes as needed for chest pain.     . pantoprazole (PROTONIX) 40 MG tablet Take 1 tablet (40 mg total) by mouth daily before breakfast. 30 tablet 11  . polyethylene glycol (MIRALAX / GLYCOLAX) packet Take 17 g by mouth daily.    . potassium chloride SA (K-DUR,KLOR-CON) 20 MEQ tablet Take 1 tablet (20 mEq total) by mouth  daily. 90 tablet 3  . promethazine (PHENERGAN) 25 MG tablet Take 25 mg by mouth every 6 (six) hours as needed for nausea.    . traMADol (ULTRAM) 50 MG tablet Take 1 tablet (50 mg total) by mouth 4 (four) times daily. 120 tablet 2  . vitamin C (ASCORBIC ACID) 500 MG tablet Take 500 mg by mouth daily.    Marland Kitchen VITAMIN D, CHOLECALCIFEROL, PO Take 1,200 Units by mouth daily.     No facility-administered medications prior to visit.     ROS See HPI  Objective:  BP 124/78   Pulse 88   Temp 98.4 F (36.9 C)   Ht '5\' 7"'$  (1.702 m)   Wt 243 lb (110.2 kg)   SpO2 96%   BMI 38.06 kg/m   BP Readings from Last 3 Encounters:  02/11/16 124/78  12/11/15 138/82  11/14/15 (!) 154/98    Wt Readings from Last 3 Encounters:  02/11/16 243 lb (110.2  kg)  12/11/15 240 lb (108.9 kg)  11/14/15 238 lb (108 kg)    Physical Exam  Constitutional: She is oriented to person, place, and time. No distress.  HENT:  Right Ear: Tympanic membrane, external ear and ear canal normal.  Left Ear: Tympanic membrane, external ear and ear canal normal.  Nose: Mucosal edema and rhinorrhea present. Right sinus exhibits maxillary sinus tenderness and frontal sinus tenderness. Left sinus exhibits maxillary sinus tenderness and frontal sinus tenderness.  Mouth/Throat: Uvula is midline. Posterior oropharyngeal erythema present. No oropharyngeal exudate.  Neck: Normal range of motion. Neck supple.  Cardiovascular: Normal rate and normal heart sounds.   Pulmonary/Chest: Effort normal. She has wheezes.  Neurological: She is alert and oriented to person, place, and time.  Skin: Skin is warm and dry.  Vitals reviewed.   Lab Results  Component Value Date   WBC 5.9 08/07/2014   HGB 11.7 (L) 08/07/2014   HCT 35.8 (L) 08/07/2014   PLT 249.0 08/07/2014   GLUCOSE 83 12/11/2015   CHOL 149 12/11/2015   TRIG 62.0 12/11/2015   HDL 76.80 12/11/2015   LDLCALC 60 12/11/2015   ALT 26 12/11/2015   AST 21 12/11/2015   NA 142 12/11/2015   K 3.9 12/11/2015   CL 116 (H) 12/11/2015   CREATININE 0.91 12/11/2015   BUN 13 12/11/2015   CO2 21 12/11/2015   TSH 1.778 02/20/2011   INR 1.16 05/22/2013   HGBA1C 5.2 12/11/2015    Ct Head Wo Contrast  Result Date: 11/17/2015 CLINICAL DATA:  Fall on Sunday, head injury. Headaches and nausea since fall. EXAM: CT HEAD WITHOUT CONTRAST TECHNIQUE: Contiguous axial images were obtained from the base of the skull through the vertex without intravenous contrast. COMPARISON:  Brain MRI dated 04/24/2010. FINDINGS: Brain: Ventricles are normal in size and configuration. There is no mass, hemorrhage, edema or other evidence of acute parenchymal abnormality. No extra-axial hemorrhage. Vascular: No hyperdense vessel or unexpected  calcification. Skull: Negative for fracture or focal lesion. Sinuses/Orbits: No acute findings. Other: No scalp edema or scalp hematoma identified. IMPRESSION: No acute findings. No intracranial mass, hemorrhage or edema. No skull fracture. Electronically Signed   By: Franki Cabot M.D.   On: 11/17/2015 16:18    Assessment & Plan:   [name] was seen today for uri.  Diagnoses and all orders for this visit:  Acute URI -     DG Chest 2 View; Future -     ipratropium-albuterol (DUONEB) 0.5-2.5 (3) MG/3ML nebulizer solution 3 mL; Take 3 mLs  by nebulization once. -     dextromethorphan-guaiFENesin (MUCINEX DM) 30-600 MG 12hr tablet; Take 1 tablet by mouth 2 (two) times daily as needed for cough. -     methylPREDNISolone acetate (DEPO-MEDROL) injection 40 mg; Inject 1 mL (40 mg total) into the muscle once. -     benzonatate (TESSALON) 100 MG capsule; Take 1 capsule (100 mg total) by mouth 3 (three) times daily as needed for cough. -     fluticasone (FLONASE) 50 MCG/ACT nasal spray; Place 2 sprays into both nostrils daily.  Acute bronchitis, unspecified organism -     DG Chest 2 View; Future -     ipratropium-albuterol (DUONEB) 0.5-2.5 (3) MG/3ML nebulizer solution 3 mL; Take 3 mLs by nebulization once. -     dextromethorphan-guaiFENesin (MUCINEX DM) 30-600 MG 12hr tablet; Take 1 tablet by mouth 2 (two) times daily as needed for cough. -     methylPREDNISolone acetate (DEPO-MEDROL) injection 40 mg; Inject 1 mL (40 mg total) into the muscle once. -     benzonatate (TESSALON) 100 MG capsule; Take 1 capsule (100 mg total) by mouth 3 (three) times daily as needed for cough. -     albuterol (PROVENTIL HFA;VENTOLIN HFA) 108 (90 Base) MCG/ACT inhaler; Inhale 1-2 puffs into the lungs every 6 (six) hours as needed for wheezing or shortness of breath.   I am having [name] start on dextromethorphan-guaiFENesin, benzonatate, albuterol, and fluticasone. I am also having her maintain her ferrous sulfate,  multivitamins ther. w/minerals, nitroGLYCERIN, vitamin C, promethazine, aspirin EC, (VITAMIN D, CHOLECALCIFEROL, PO), BLACK COHOSH PO, MELATONIN PO, CALCIUM PO, CRANBERRY PO, polyethylene glycol, potassium chloride SA, metoCLOPramide, buPROPion, hydrocortisone, gabapentin, pantoprazole, ACCU-CHEK SOFTCLIX LANCETS, ACCU-CHEK AVIVA PLUS, glucose blood, ACCU-CHEK AVIVA, Alcohol Swabs, baclofen, cyanocobalamin, traMADol, furosemide, and metoprolol tartrate. We administered ipratropium-albuterol and methylPREDNISolone acetate.  Meds ordered this encounter  Medications  . ipratropium-albuterol (DUONEB) 0.5-2.5 (3) MG/3ML nebulizer solution 3 mL  . dextromethorphan-guaiFENesin (MUCINEX DM) 30-600 MG 12hr tablet    Sig: Take 1 tablet by mouth 2 (two) times daily as needed for cough.    Dispense:  14 tablet    Refill:  0    Order Specific Question:   Supervising Provider    Answer:   Cassandria Anger [[id number]]  . methylPREDNISolone acetate (DEPO-MEDROL) injection 40 mg  . benzonatate (TESSALON) 100 MG capsule    Sig: Take 1 capsule (100 mg total) by mouth 3 (three) times daily as needed for cough.    Dispense:  20 capsule    Refill:  0    Order Specific Question:   Supervising Provider    Answer:   Cassandria Anger [[id number]]  . albuterol (PROVENTIL HFA;VENTOLIN HFA) 108 (90 Base) MCG/ACT inhaler    Sig: Inhale 1-2 puffs into the lungs every 6 (six) hours as needed for wheezing or shortness of breath.    Dispense:  1 Inhaler    Refill:  2    Order Specific Question:   Supervising Provider    Answer:   Cassandria Anger [[id number]]  . fluticasone (FLONASE) 50 MCG/ACT nasal spray    Sig: Place 2 sprays into both nostrils daily.    Dispense:  16 g    Refill:  0    Order Specific Question:   Supervising Provider    Answer:   Cassandria Anger [[id number]]   Today's chest x-ray indicates bronchitis.  Follow-up: Return if symptoms worsen or fail to improve.  Wilfred Lacy, NP

## 2016-02-11 NOTE — Patient Instructions (Signed)
URI Instructions: Flonase and Afrin use: apply 1spray of afrin in each nare, wait 72mins, then apply 2sprays of flonase in each nare. Use both nasal spray consecutively x 3days, then flonase only for at least 14days.  Use over-the-counter  "cold" medicines  such as "Tylenol cold" , "Advil cold",  "Mucinex" or" Mucinex D"  for cough and congestion.  Avoid decongestants if you have high blood pressure. Use" Delsym" or" Robitussin" cough syrup varietis for cough.  You can use plain "Tylenol" or "Advi"l for fever, chills and achyness.   "Common cold" symptoms are usually triggered by a virus.  The antibiotics are usually not necessary. On average, a" viral cold" illness would take 4-7 days to resolve. Please, make an appointment if you are not better or if you're worse.  You will be called with CXR results. Encourage adequate oral hydration

## 2016-02-11 NOTE — Progress Notes (Signed)
Pre visit review using our clinic review tool, if applicable. No additional management support is needed unless otherwise documented below in the visit note. 

## 2016-02-12 DIAGNOSIS — J069 Acute upper respiratory infection, unspecified: Secondary | ICD-10-CM | POA: Diagnosis not present

## 2016-02-12 DIAGNOSIS — J209 Acute bronchitis, unspecified: Secondary | ICD-10-CM | POA: Diagnosis not present

## 2016-02-12 MED ORDER — ALBUTEROL SULFATE (2.5 MG/3ML) 0.083% IN NEBU
2.5000 mg | INHALATION_SOLUTION | Freq: Once | RESPIRATORY_TRACT | Status: AC
  Administered 2016-02-12: 2.5 mg via RESPIRATORY_TRACT

## 2016-02-12 NOTE — Addendum Note (Signed)
Addended by: Terence Lux B on: 02/12/2016 03:36 PM   Modules accepted: Orders

## 2016-02-17 ENCOUNTER — Ambulatory Visit (HOSPITAL_COMMUNITY)
Admission: EM | Admit: 2016-02-17 | Discharge: 2016-02-17 | Disposition: A | Discharge status: Home / Self Care | Payer: Commercial Managed Care - HMO | Attending: Emergency Medicine | Admitting: Emergency Medicine

## 2016-02-17 ENCOUNTER — Encounter (HOSPITAL_COMMUNITY): Payer: Self-pay | Admitting: Emergency Medicine

## 2016-02-17 ENCOUNTER — Ambulatory Visit: Payer: Self-pay | Admitting: Internal Medicine

## 2016-02-17 DIAGNOSIS — M791 Myalgia: Secondary | ICD-10-CM

## 2016-02-17 DIAGNOSIS — M7918 Myalgia, other site: Secondary | ICD-10-CM

## 2016-02-17 DIAGNOSIS — Z0289 Encounter for other administrative examinations: Secondary | ICD-10-CM

## 2016-02-17 MED ORDER — BACLOFEN 10 MG PO TABS: 10.0000 mg | ORAL_TABLET | Freq: Three times a day (TID) | ORAL | 0 refills | Status: DC | PRN
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
## 2016-02-24 ENCOUNTER — Encounter: Payer: Self-pay | Admitting: Internal Medicine

## 2016-02-24 ENCOUNTER — Ambulatory Visit (INDEPENDENT_AMBULATORY_CARE_PROVIDER_SITE_OTHER): Payer: Commercial Managed Care - HMO | Admitting: Internal Medicine

## 2016-02-24 DIAGNOSIS — R05 Cough: Secondary | ICD-10-CM

## 2016-02-24 DIAGNOSIS — M25512 Pain in left shoulder: Secondary | ICD-10-CM

## 2016-02-24 DIAGNOSIS — M25561 Pain in right knee: Secondary | ICD-10-CM

## 2016-02-24 DIAGNOSIS — R059 Cough, unspecified: Secondary | ICD-10-CM

## 2016-02-24 MED ORDER — HYDROCODONE-HOMATROPINE 5-1.5 MG/5ML PO SYRP: 5.0000 mL | ORAL_SOLUTION | Freq: Three times a day (TID) | ORAL | 0 refills | Status: DC | PRN

## 2016-02-24 NOTE — Patient Instructions (Signed)
We have given you the prescription for the cough syrup to use to help with the cough. You can take it when you do not have to drive afterwards.   Your lungs sound clear and likely the cough is just leftover from the virus. You do not need an antibiotic today.  Go back and see Dr. Tamala Julian for the knees and the neck and shoulder pain to see if he can do an adjustment.
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
## 2016-02-24 NOTE — Progress Notes (Signed)
Pre visit review using our clinic review tool, if applicable. No additional management support is needed unless otherwise documented below in the visit note. 
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
## 2016-02-24 NOTE — Assessment & Plan Note (Signed)
No indication for antibiotics today. She has used tesalon perles which did not help much. Can continue with mucinex and rx for hycodan given to her today for relief of the cough at night so she can sleep. Overall nasal symptoms are resolved with flonase so she is improving.

## 2016-02-24 NOTE — Assessment & Plan Note (Signed)
Some stability with walking, pain is mild overall, she is using muscle relaxer for the pain, referral to sports medicine for evaluation. Using cane now for stability.

## 2016-02-26 ENCOUNTER — Ambulatory Visit: Payer: Commercial Managed Care - HMO | Admitting: Family Medicine
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
## 2016-02-27 ENCOUNTER — Encounter: Payer: Self-pay | Admitting: Family Medicine

## 2016-02-27 ENCOUNTER — Ambulatory Visit (INDEPENDENT_AMBULATORY_CARE_PROVIDER_SITE_OTHER): Payer: Commercial Managed Care - HMO | Admitting: Family Medicine

## 2016-02-27 ENCOUNTER — Ambulatory Visit (INDEPENDENT_AMBULATORY_CARE_PROVIDER_SITE_OTHER)
Admission: RE | Admit: 2016-02-27 | Discharge: 2016-02-27 | Disposition: A | Discharge status: Home / Self Care | Payer: Commercial Managed Care - HMO | Source: Ambulatory Visit | Attending: Family Medicine | Admitting: Family Medicine

## 2016-02-27 VITALS — BP 140/82 | HR 62 | Ht 67.0 in | Wt 244.2 lb

## 2016-02-27 DIAGNOSIS — M542 Cervicalgia: Secondary | ICD-10-CM

## 2016-02-27 DIAGNOSIS — M25561 Pain in right knee: Secondary | ICD-10-CM

## 2016-02-27 DIAGNOSIS — M5010 Cervical disc disorder with radiculopathy, unspecified cervical region: Secondary | ICD-10-CM | POA: Diagnosis not present

## 2016-02-27 DIAGNOSIS — S199XXA Unspecified injury of neck, initial encounter: Secondary | ICD-10-CM | POA: Diagnosis not present

## 2016-02-27 DIAGNOSIS — M1711 Unilateral primary osteoarthritis, right knee: Secondary | ICD-10-CM | POA: Diagnosis not present

## 2016-02-27 DIAGNOSIS — S8991XA Unspecified injury of right lower leg, initial encounter: Secondary | ICD-10-CM | POA: Diagnosis not present

## 2016-02-27 MED ORDER — PREDNISONE 50 MG PO TABS: 50.0000 mg | ORAL_TABLET | Freq: Every day | ORAL | 0 refills | Status: DC

## 2016-02-27 NOTE — Assessment & Plan Note (Signed)
Patient is having radicular symptoms with weakness of the upper extruded. We discussed with patient about different treatment options. Patient has elected try prednisone and will monitor her blood sugar closely. We discussed icing regimen, home exercises, we discussed which activities to avoid. X-rays are pending. Follow-up again in 1 week. Worsening symptoms advance imaging would be warranted. Patient knows of pain, weakness or numbness seems to get worse to seek medical attention immediately.

## 2016-02-27 NOTE — Assessment & Plan Note (Signed)
Patient does have more of a degenerative right knee. Discussed with patient at great length. We discussed the possibility of injection which patient declined with her pain on oral prednisone she is hoping that this will be beneficial. Patient will continue to walk with a cane. Likely will need stability brace is in the long run. Patient once to avoid any type of surgical intervention. With that all of patient's muscle pains workup for autoimmune disease may be necessary. Follow-up again in 1 week

## 2016-02-27 NOTE — Patient Instructions (Addendum)
Good to see you  Ice is your friend  Prednisone daily for 5 days but watch out  For your blood sugars.  We will get xray of your neck and your right knee.  See me again in 5-7 days and make sure you are doing better.
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
## 2016-03-04 ENCOUNTER — Ambulatory Visit (INDEPENDENT_AMBULATORY_CARE_PROVIDER_SITE_OTHER): Payer: Commercial Managed Care - HMO | Admitting: Family Medicine

## 2016-03-04 ENCOUNTER — Ambulatory Visit (INDEPENDENT_AMBULATORY_CARE_PROVIDER_SITE_OTHER)
Admission: RE | Admit: 2016-03-04 | Discharge: 2016-03-04 | Disposition: A | Discharge status: Home / Self Care | Payer: Commercial Managed Care - HMO | Source: Ambulatory Visit | Attending: Internal Medicine | Admitting: Internal Medicine

## 2016-03-04 ENCOUNTER — Encounter: Payer: Self-pay | Admitting: Internal Medicine

## 2016-03-04 ENCOUNTER — Other Ambulatory Visit (INDEPENDENT_AMBULATORY_CARE_PROVIDER_SITE_OTHER): Payer: Commercial Managed Care - HMO

## 2016-03-04 ENCOUNTER — Ambulatory Visit (INDEPENDENT_AMBULATORY_CARE_PROVIDER_SITE_OTHER): Payer: Commercial Managed Care - HMO | Admitting: Internal Medicine

## 2016-03-04 ENCOUNTER — Encounter: Payer: Self-pay | Admitting: Family Medicine

## 2016-03-04 VITALS — BP 132/84 | HR 81 | Ht 67.0 in | Wt 243.0 lb

## 2016-03-04 VITALS — BP 142/88 | HR 72 | Temp 98.4°F | Resp 16 | Ht 67.0 in | Wt 243.0 lb

## 2016-03-04 DIAGNOSIS — R05 Cough: Secondary | ICD-10-CM

## 2016-03-04 DIAGNOSIS — M5010 Cervical disc disorder with radiculopathy, unspecified cervical region: Secondary | ICD-10-CM

## 2016-03-04 DIAGNOSIS — R7989 Other specified abnormal findings of blood chemistry: Secondary | ICD-10-CM

## 2016-03-04 DIAGNOSIS — M1711 Unilateral primary osteoarthritis, right knee: Secondary | ICD-10-CM | POA: Diagnosis not present

## 2016-03-04 DIAGNOSIS — R531 Weakness: Secondary | ICD-10-CM

## 2016-03-04 DIAGNOSIS — F4323 Adjustment disorder with mixed anxiety and depressed mood: Secondary | ICD-10-CM

## 2016-03-04 DIAGNOSIS — R059 Cough, unspecified: Secondary | ICD-10-CM

## 2016-03-04 LAB — CBC
HCT: 38.5 % (ref 35.0–45.0)
HEMOGLOBIN: 12.3 g/dL (ref 11.7–15.5)
MCH: 27.5 pg (ref 27.0–33.0)
MCHC: 31.9 g/dL — ABNORMAL LOW (ref 32.0–36.0)
MCV: 86.1 fL (ref 80.0–100.0)
MPV: 9.4 fL (ref 7.5–12.5)
PLATELETS: 231 10*3/uL (ref 140–400)
RBC: 4.47 MIL/uL (ref 3.80–5.10)
RDW: 16.3 % — ABNORMAL HIGH (ref 11.0–15.0)
WBC: 7 10*3/uL (ref 3.8–10.8)

## 2016-03-04 LAB — COMPREHENSIVE METABOLIC PANEL
ALK PHOS: 116 U/L (ref 39–117)
ALT: 51 U/L — AB (ref 0–35)
AST: 38 U/L — ABNORMAL HIGH (ref 0–37)
Albumin: 3.9 g/dL (ref 3.5–5.2)
BUN: 18 mg/dL (ref 6–23)
CO2: 20 meq/L (ref 19–32)
Calcium: 8.9 mg/dL (ref 8.4–10.5)
Chloride: 113 mEq/L — ABNORMAL HIGH (ref 96–112)
Creatinine, Ser: 1 mg/dL (ref 0.40–1.20)
GFR: 72.16 mL/min (ref >60.00)
GLUCOSE: 113 mg/dL — AB (ref 70–99)
POTASSIUM: 4 meq/L (ref 3.5–5.1)
SODIUM: 140 meq/L (ref 135–145)
TOTAL PROTEIN: 6.9 g/dL (ref 6.0–8.3)
Total Bilirubin: 0.4 mg/dL (ref 0.2–1.2)

## 2016-03-04 LAB — VITAMIN B12: Vitamin B-12: >1500 pg/mL — ABNORMAL HIGH (ref 211–911)

## 2016-03-04 LAB — BRAIN NATRIURETIC PEPTIDE: PRO B NATRI PEPTIDE: 58 pg/mL (ref 0.0–100.0)

## 2016-03-04 LAB — TSH: TSH: 0.54 u[IU]/mL (ref 0.35–4.50)

## 2016-03-04 LAB — TROPONIN I: TNIDX: 0.01 ug/l (ref 0.00–0.06)

## 2016-03-04 MED ORDER — PREDNISONE 20 MG PO TABS: 40.0000 mg | ORAL_TABLET | Freq: Every day | ORAL | 0 refills | Status: DC

## 2016-03-04 MED ORDER — KETOROLAC TROMETHAMINE 60 MG/2ML IM SOLN
60.0000 mg | Freq: Once | INTRAMUSCULAR | Status: AC
  Administered 2016-03-04: 60 mg via INTRAMUSCULAR

## 2016-03-04 MED ORDER — DOXYCYCLINE MONOHYDRATE 100 MG PO CAPS: 100.0000 mg | ORAL_CAPSULE | Freq: Two times a day (BID) | ORAL | 0 refills | Status: DC

## 2016-03-04 NOTE — Progress Notes (Signed)
Pre visit review using our clinic review tool, if applicable. No additional management support is needed unless otherwise documented below in the visit note. 

## 2016-03-04 NOTE — Assessment & Plan Note (Signed)
We will wait until patient's cervical neck pain has resolved.

## 2016-03-04 NOTE — Assessment & Plan Note (Signed)
Significantly concern at this point the patient has no remnant impingement. We needed stat MRI for further evaluation. Patient knows if worsening symptoms to seek medical attention immediately. Continue the gabapentin at higher doses as well as the patient given a Toradol injection today for pain relief. We discussed possibly repeating the prednisone but only minimal improvement with it. Patient was on tramadol and will continue. Hopefully the MRI will show some type of pathology that is contributing and we can seek further evaluation and management and intervention.  ]Spent  25 minutes with patient face-to-face and had greater than 50% of counseling including as described above in assessment and plan.

## 2016-03-04 NOTE — Patient Instructions (Signed)
Good to see you  We will get you a little relief with an injection today  Dr. Sharlet Salina will see you and discuss labs (consider auto-immune workup) and rule things out like infection.  Tramadol with a tylenol can help the pain  Continue the gabapentin at night We will get MRI stat and see what is going on with your neck and I will write you when I get results.

## 2016-03-04 NOTE — Patient Instructions (Signed)
We are checking a chest x-ray today and the MRI of the brain and spine as soon as possible.   We are also checking labs today and will call you back with results.   We have sent in prednisone. Take 2 pills daily for 5 days.   We have sent in the antibiotic doxycycline. Take 1 pill twice a day for 10 days.

## 2016-03-05 DIAGNOSIS — R5383 Other fatigue: Secondary | ICD-10-CM | POA: Insufficient documentation

## 2016-03-05 DIAGNOSIS — F4323 Adjustment disorder with mixed anxiety and depressed mood: Secondary | ICD-10-CM | POA: Insufficient documentation

## 2016-03-05 DIAGNOSIS — R531 Weakness: Secondary | ICD-10-CM | POA: Insufficient documentation

## 2016-03-05 LAB — ANA: ANA: NEGATIVE

## 2016-03-05 NOTE — Progress Notes (Signed)
   Subjective:    Patient ID: [name], female    DOB: 1956/04/03, 60 y.o.   MRN: [id number]  HPI The patient is a 61 YO female coming in for several acute problems. She is having cough and SOB (going on worse for the last 1-2 weeks, more SOB with exertion, still taking her normal breathing medications, yellow sputum, nasal drainage, more tired and weak, no energy) as well as new weakness in her arms and legs (more in the arms and some in the legs, going on for 1-2 weeks as well, since it started no changes, states she was told long ago there was some MS changes possibly on her brain but was never really evaluated, also some neck pain and saw sports medicine who is getting an MRI spine to check for impingement) and adjustment problems (she is very upset with this recent decline in her health, she is crying a lot and wants to talk to a counselor and needs referral, denies SI/HI, does have some change to her sleeping).   Review of Systems  Constitutional: Positive for activity change, appetite change and fatigue. Negative for chills, fever and unexpected weight change.  HENT: Positive for congestion, postnasal drip and rhinorrhea. Negative for sneezing, sore throat and tinnitus.   Eyes: Negative.   Respiratory: Positive for cough, shortness of breath and wheezing. Negative for chest tightness.   Cardiovascular: Negative.   Gastrointestinal: Negative.   Musculoskeletal: Positive for arthralgias, back pain, neck pain and neck stiffness.  Skin: Negative.   Neurological: Positive for weakness and numbness. Negative for dizziness, tremors, seizures, speech difficulty, light-headedness and headaches.  Hematological: Negative.   Psychiatric/Behavioral: Positive for decreased concentration, dysphoric mood and sleep disturbance. Negative for agitation, self-injury and suicidal ideas. The patient is nervous/anxious.       Objective:   Physical Exam  Constitutional: She is oriented to person,  place, and time. She appears well-developed. She appears distressed.  Mild distress  HENT:  Head: Normocephalic and atraumatic.  Oropharynx with redness and clear drainage  Eyes: EOM are normal.  Neck: Normal range of motion.  Cardiovascular: Normal rate and regular rhythm.   Pulmonary/Chest: Effort normal. No respiratory distress. She has wheezes. She has no rales. She exhibits no tenderness.  Some scattered wheezing and rhonchi in the lungs which partially clears with cough.   Abdominal: Soft. She exhibits no distension. There is no tenderness. There is no rebound.  Musculoskeletal: She exhibits no edema.  Neurological: She is alert and oriented to person, place, and time. A cranial nerve deficit is present.  Significant weakness left arm worse than right and 3/5 strength in the legs bilaterally with mild weakness on the left leg, no change in sensation.   Skin: Skin is warm and dry.    Vitals:   03/04/16 1429  BP: (!) 142/88  Pulse: 72  Resp: 16  Temp: 98.4 F (36.9 C)  TempSrc: Oral  SpO2: 99%  Weight: 243 lb (110.2 kg)  Height: 5\' 7"  (1.702 m)      Assessment & Plan:

## 2016-03-05 NOTE — Assessment & Plan Note (Signed)
Referral to psychology for counseling. She is having new problems which needs to be evaluated as well. She knows to call if any SI develops.

## 2016-03-05 NOTE — Assessment & Plan Note (Signed)
Concern for stroke or other acute brain changes. Checking MRI brain to be done with the MRI cervical spine to evaluate for MS versus stroke. CT would not be adequate to evaluate for MS. Also checking labs to look for cause including CBC, CMP, ANA, BNP, troponin, TSH, B12.

## 2016-03-05 NOTE — Assessment & Plan Note (Signed)
Checking CXR and rx for doxycycline and prednisone given the wheezing on exam and worsening SOB and cough.

## 2016-03-10 ENCOUNTER — Telehealth: Payer: Self-pay | Admitting: Internal Medicine

## 2016-03-10 NOTE — Telephone Encounter (Signed)
States had stat order for patient but patient did not want to schedule until December 5th.

## 2016-03-23 ENCOUNTER — Ambulatory Visit
Admission: RE | Admit: 2016-03-23 | Discharge: 2016-03-23 | Disposition: A | Discharge status: Home / Self Care | Payer: Commercial Managed Care - HMO | Source: Ambulatory Visit | Attending: Family Medicine | Admitting: Family Medicine

## 2016-03-23 ENCOUNTER — Ambulatory Visit
Admission: RE | Admit: 2016-03-23 | Discharge: 2016-03-23 | Disposition: A | Discharge status: Home / Self Care | Payer: Commercial Managed Care - HMO | Source: Ambulatory Visit | Attending: Internal Medicine | Admitting: Internal Medicine

## 2016-03-23 DIAGNOSIS — R930 Abnormal findings on diagnostic imaging of skull and head, not elsewhere classified: Secondary | ICD-10-CM | POA: Diagnosis not present

## 2016-03-23 DIAGNOSIS — R531 Weakness: Secondary | ICD-10-CM

## 2016-03-23 DIAGNOSIS — M50222 Other cervical disc displacement at C5-C6 level: Secondary | ICD-10-CM | POA: Diagnosis not present

## 2016-03-23 DIAGNOSIS — M5010 Cervical disc disorder with radiculopathy, unspecified cervical region: Secondary | ICD-10-CM

## 2016-03-25 ENCOUNTER — Ambulatory Visit
Admission: RE | Admit: 2016-03-25 | Discharge: 2016-03-25 | Disposition: A | Discharge status: Home / Self Care | Payer: Commercial Managed Care - HMO | Source: Ambulatory Visit

## 2016-03-25 DIAGNOSIS — Z1231 Encounter for screening mammogram for malignant neoplasm of breast: Secondary | ICD-10-CM
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
## 2016-03-26 ENCOUNTER — Ambulatory Visit (INDEPENDENT_AMBULATORY_CARE_PROVIDER_SITE_OTHER): Payer: Commercial Managed Care - HMO | Admitting: Family Medicine

## 2016-03-26 ENCOUNTER — Encounter: Payer: Self-pay | Admitting: Family Medicine

## 2016-03-26 VITALS — BP 144/82 | HR 79 | Ht 67.0 in | Wt 243.0 lb

## 2016-03-26 DIAGNOSIS — M5416 Radiculopathy, lumbar region: Secondary | ICD-10-CM

## 2016-03-26 DIAGNOSIS — R279 Unspecified lack of coordination: Secondary | ICD-10-CM | POA: Diagnosis not present

## 2016-03-26 DIAGNOSIS — M1711 Unilateral primary osteoarthritis, right knee: Secondary | ICD-10-CM

## 2016-03-26 DIAGNOSIS — M5010 Cervical disc disorder with radiculopathy, unspecified cervical region: Secondary | ICD-10-CM | POA: Diagnosis not present

## 2016-03-26 DIAGNOSIS — R2689 Other abnormalities of gait and mobility: Secondary | ICD-10-CM | POA: Diagnosis not present

## 2016-03-26 DIAGNOSIS — M797 Fibromyalgia: Secondary | ICD-10-CM

## 2016-03-26 MED ORDER — DULOXETINE HCL 20 MG PO CPEP: 20.0000 mg | ORAL_CAPSULE | Freq: Every day | ORAL | 3 refills | Status: DC

## 2016-03-26 MED ORDER — METHYLPREDNISOLONE ACETATE 80 MG/ML IJ SUSP
80.0000 mg | Freq: Once | INTRAMUSCULAR | Status: AC
  Administered 2016-03-26: 80 mg via INTRAMUSCULAR

## 2016-03-26 MED ORDER — KETOROLAC TROMETHAMINE 60 MG/2ML IM SOLN
60.0000 mg | Freq: Once | INTRAMUSCULAR | Status: AC
  Administered 2016-03-26: 60 mg via INTRAMUSCULAR

## 2016-03-26 NOTE — Assessment & Plan Note (Signed)
Spinal stenosis noted on MRI. Patient will be sent with an epidural at this time. Started some golf at a low dose. Patient does well we will decrease the Wellbutrin and increase the Cymbalta. Follow-up again in 2 weeks after epidural steroid injection at C7-T1.

## 2016-03-26 NOTE — Assessment & Plan Note (Signed)
Stable at the time. No changes. Worsening symptoms consider injection.

## 2016-03-26 NOTE — Assessment & Plan Note (Signed)
Encourage weight loss. 

## 2016-03-26 NOTE — Assessment & Plan Note (Signed)
Started on low dose of Cymbalta. Likely contributing to some of her discomfort that seems to be chronic.

## 2016-03-26 NOTE — Assessment & Plan Note (Signed)
Has had multiple surgeries. Encourage her to follow-up with her neurosurgeon. We'll get her into physical therapy secondary to the weakness and no fluid level help with some of the balance and coordination.

## 2016-03-26 NOTE — Patient Instructions (Addendum)
Great to see you  Alvera Singh is your friend.  I do feel the injection could help a lot.  Cymbalta 20 mg daily  2 injections today  Physical therapy will be calling you for balance and coordination.  See me again 2 weeks after injection  Happy holidays!

## 2016-03-29 ENCOUNTER — Encounter: Payer: Self-pay | Admitting: Internal Medicine

## 2016-03-29 ENCOUNTER — Encounter: Payer: Self-pay | Admitting: Family Medicine

## 2016-04-14 ENCOUNTER — Encounter: Payer: Self-pay | Admitting: Family Medicine

## 2016-04-15 ENCOUNTER — Ambulatory Visit: Payer: Commercial Managed Care - HMO | Admitting: Family Medicine

## 2016-04-16 ENCOUNTER — Ambulatory Visit: Payer: Commercial Managed Care - HMO | Attending: Family Medicine | Admitting: Rehabilitation
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
## 2016-04-26 ENCOUNTER — Encounter: Payer: Self-pay | Admitting: Physical Therapy

## 2016-04-26 ENCOUNTER — Encounter: Payer: Self-pay | Admitting: Family Medicine

## 2016-04-26 ENCOUNTER — Ambulatory Visit: Payer: Medicare HMO | Attending: Family Medicine | Admitting: Physical Therapy

## 2016-04-26 ENCOUNTER — Ambulatory Visit (INDEPENDENT_AMBULATORY_CARE_PROVIDER_SITE_OTHER): Payer: Medicare HMO | Admitting: Family Medicine

## 2016-04-26 VITALS — BP 140/88 | HR 97 | Ht 67.0 in | Wt 249.0 lb

## 2016-04-26 DIAGNOSIS — M5010 Cervical disc disorder with radiculopathy, unspecified cervical region: Secondary | ICD-10-CM

## 2016-04-26 DIAGNOSIS — R296 Repeated falls: Secondary | ICD-10-CM | POA: Insufficient documentation

## 2016-04-26 DIAGNOSIS — G8929 Other chronic pain: Secondary | ICD-10-CM | POA: Diagnosis not present

## 2016-04-26 DIAGNOSIS — M545 Low back pain: Secondary | ICD-10-CM | POA: Diagnosis not present

## 2016-04-26 DIAGNOSIS — R2681 Unsteadiness on feet: Secondary | ICD-10-CM | POA: Diagnosis not present

## 2016-04-26 DIAGNOSIS — M1711 Unilateral primary osteoarthritis, right knee: Secondary | ICD-10-CM

## 2016-04-26 DIAGNOSIS — M542 Cervicalgia: Secondary | ICD-10-CM | POA: Diagnosis not present

## 2016-04-26 DIAGNOSIS — M5416 Radiculopathy, lumbar region: Secondary | ICD-10-CM

## 2016-04-26 DIAGNOSIS — R2689 Other abnormalities of gait and mobility: Secondary | ICD-10-CM

## 2016-04-26 DIAGNOSIS — M6281 Muscle weakness (generalized): Secondary | ICD-10-CM | POA: Diagnosis not present

## 2016-04-26 MED ORDER — TRAMADOL HCL 50 MG PO TABS: 50.0000 mg | ORAL_TABLET | Freq: Four times a day (QID) | ORAL | 2 refills | Status: DC

## 2016-04-26 NOTE — Assessment & Plan Note (Signed)
Stable at present moment.

## 2016-04-26 NOTE — Patient Instructions (Addendum)
Good to see you  Ice is your friend  We injected your knee.  Dr. Vertell Limber office should be calling you  I hope this helps for some time For the knee I can do other injection if pain comes back before 10 months See me again in 6-8 weeks.

## 2016-04-26 NOTE — Assessment & Plan Note (Signed)
Patient was given injection today. Tolerated the procedure well. We discussed icing regimen and home exercises. We discussed patient would be a candidate for viscous supplementation if necessary. Encourage her to continue conservative therapy otherwise.

## 2016-04-26 NOTE — Assessment & Plan Note (Signed)
Worsening symptoms at this time. There is some mild response to the epidural. I do believe that patient could be a surgical candidate at this time. Continued weakness of the upper extra me. Referred to neurosurgery for further evaluation and management.

## 2016-04-27 ENCOUNTER — Ambulatory Visit: Payer: Commercial Managed Care - HMO | Admitting: Licensed Clinical Social Worker
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
## 2016-04-28 ENCOUNTER — Ambulatory Visit: Payer: Medicare HMO | Admitting: Physical Therapy

## 2016-04-29 ENCOUNTER — Ambulatory Visit: Payer: Self-pay | Admitting: Family Medicine

## 2016-05-03 ENCOUNTER — Ambulatory Visit: Payer: Medicare HMO | Admitting: Physical Therapy

## 2016-05-03 ENCOUNTER — Encounter: Payer: Self-pay | Admitting: Physical Therapy

## 2016-05-03 DIAGNOSIS — M6281 Muscle weakness (generalized): Secondary | ICD-10-CM

## 2016-05-03 DIAGNOSIS — G8929 Other chronic pain: Secondary | ICD-10-CM

## 2016-05-03 DIAGNOSIS — M542 Cervicalgia: Secondary | ICD-10-CM

## 2016-05-03 DIAGNOSIS — R2681 Unsteadiness on feet: Secondary | ICD-10-CM | POA: Diagnosis not present

## 2016-05-03 DIAGNOSIS — R296 Repeated falls: Secondary | ICD-10-CM

## 2016-05-03 DIAGNOSIS — M545 Low back pain: Secondary | ICD-10-CM

## 2016-05-03 DIAGNOSIS — R2689 Other abnormalities of gait and mobility: Secondary | ICD-10-CM

## 2016-05-03 NOTE — Therapy (Signed)
[facility] [address] [place], [place], [address] Phone: [phone]   Fax:  [phone]  Physical Therapy Treatment  Patient Details  Name: [name] MRN: [id number] Date of Birth: 1955-09-18 Referring Provider: Hulan Saas, DO Pricilla Holm, PCP)  Encounter Date: 05/03/2016      PT End of Session - 05/03/16 1411    Visit Number 2   Number of Visits 18   Date for PT Re-Evaluation 06/25/16   Authorization Type Medicare G-Code & progress note every 10 visits.   PT Start Time 1406   PT Stop Time 1447   PT Time Calculation (min) 41 min   Equipment Utilized During Treatment Left knee immobilizer;Gait belt   Activity Tolerance Patient limited by fatigue;Patient limited by pain   Behavior During Therapy [facility] for tasks assessed/performed      Past Medical History:  Diagnosis Date  . Anemia    takes Ferrous Sulfate daily  . Anxiety    takes Citalopram daily  . Arthritis   . Asthma    2004-prior to gastric bypass and no problems since  . CHF (congestive heart failure) (HCC)    takes Lasix daily as needed  . Chronic back pain    spondylolisthesis/stenosis/radiculopathy  . Complication of anesthesia yrs ago   slow to wake up  . Depression   . Diabetes ([place])   . DVT (deep venous thrombosis) ([place]) 01-17-13   past hx. -tx.5-6 yrs ago bilateral legs, occ. sporadic swelling, has IVC filter implanted  . Dysrhythmia    "heart tends to flutter"  . Fibromyalgia   . Fracture    right foot and is in a cam boot  . Gallstones   . GERD (gastroesophageal reflux disease)    hx of-no meds now  . Heart murmur   . History of bronchitis 2012 or 2013  . History of colon polyps   . Hypertension    takes Metoprolol daily  . Insomnia    takes Melatonin daily  . Joint pain   . Joint swelling   . Pelvic floor dysfunction   . Peripheral neuropathy ([place])   . Pneumonia 90's   hx of  . Sleep apnea    no cpap  used in many yrs after weight lost-no machine now  . Urinary frequency   . Urinary urgency   . Vitamin D deficiency     Past Surgical History:  Procedure Laterality Date  . BALLOON DILATION N/A 01/31/2013   Procedure: BALLOON DILATION;  Surgeon: Arta Silence, MD;  Location: [facility];  Service: Endoscopy;  Laterality: N/A;  . CHOLECYSTECTOMY  2007  . COLONOSCOPY    . DILATION AND CURETTAGE OF UTERUS  yrs ago  . ESOPHAGOGASTRODUODENOSCOPY (EGD) WITH PROPOFOL  03/01/2012   Procedure: ESOPHAGOGASTRODUODENOSCOPY (EGD) WITH PROPOFOL;  Surgeon: Arta Silence, MD;  [facility]: [facility];  Service: Endoscopy;  Laterality: N/A;  . ESOPHAGOGASTRODUODENOSCOPY (EGD) WITH PROPOFOL N/A 01/31/2013   Procedure: ESOPHAGOGASTRODUODENOSCOPY (EGD) WITH PROPOFOL;  Surgeon: Arta Silence, MD;  [facility];  Service: Endoscopy;  Laterality: N/A;  . ESOPHAGOGASTRODUODENOSCOPY (EGD) WITH PROPOFOL N/A 09/02/2015   Procedure: ESOPHAGOGASTRODUODENOSCOPY (EGD) WITH PROPOFOL;  Surgeon: Gatha Mayer, MD;  [facility];  Service: Endoscopy;  Laterality: N/A;  . GASTRIC BY-PASS  2004  . HERNIA REPAIR  2005  . INSERTION OF VENA CAVA FILTER  01-17-13   inserted 2004- "abdomen"  . LIGAMENT REPAIR Right 1987   Rt. knee scope  . MAXIMUM ACCESS (MAS)POSTERIOR LUMBAR  INTERBODY FUSION (PLIF) 2 LEVEL N/A 06/07/2014   Procedure: L4-5 L5-S1 FOR MAXIMUM ACCESS (MAS) POSTERIOR LUMBAR INTERBODY FUSION ;  Surgeon: Erline Levine, MD;  Location: [facility];  Service: Neurosurgery;  Laterality: N/A;  L4-5 L5-S1 FOR MAXIMUM ACCESS (MAS) POSTERIOR LUMBAR INTERBODY FUSION   . TONSILLECTOMY     as child    There were no vitals filed for this visit.      Subjective Assessment - 05/03/16 1410    Subjective Reports the back is hurting today. No falls to report. No new complaints.   Pertinent History arthritis, asthma, LBP, DM, peripheral neuropathy, fibromylagia, HTN   Limitations Lifting;Standing    Patient Stated Goals Get back to normal routine of daily walking, going to [facility] pool 3x/wk.   Currently in Pain? Yes   Pain Score 6    Pain Location Back   Pain Orientation Mid;Lower   Pain Descriptors / Indicators Aching   Pain Type Chronic pain   Pain Onset More than a month ago   Pain Frequency Constant   Aggravating Factors  standing and walking, cold weather   Pain Relieving Factors medication, heat/ice, stretching     Treatment: Issued the following to pt's HEP:  Pelvic Tilt: Posterior - Legs Bent (Supine)    Tighten stomach and flatten back by rolling pelvis down. Hold __5__ seconds. Relax. Repeat _10_ times per set. Do _1__ sets per session. Do _1-2_ sessions per day.  http://orth.exer.us/202   Copyright  VHI. All rights reserved.   Knee-to-Chest Stretch: Unilateral    With hand/towel behind right knee, pull knee in to chest until a comfortable stretch is felt in lower back and buttocks. Keep back relaxed. Hold _20_ seconds. Repeat __3__ times each leg. Do _1_ sets per session. Do _1-2_ sessions per day.  http://orth.exer.us/126   Copyright  VHI. All rights reserved.    Bridge    Lie as pictured: with tight tummy (belly button to spine) lift hips up by pressing down through legs and arms. Slowly lower hips back down.  Repeat _10_ times. Do _1-2_ sessions per day.  http://pm.exer.us/55   Copyright  VHI. All rights reserved.   Functional Quadriceps: Sit to Stand    Sit on edge of chair, feet flat on floor. Stand upright, extending knees fully. Repeat _10_ times per set. Do _1_ sets per session. Do _1-2_ sessions per day.  http://orth.exer.us/735   Copyright  VHI. All rights reserved.   Perform these at kitchen counter top for balance assist as needed:  Side-Stepping    With light support on counter top: side step toward one side and then back to the other side. Keep hips and feet pointed at counter top. Make sure to lift foot with stepping  and not slide it.  Repeat for 3 laps each way. Do __1-2__ sessions per day.  Copyright  VHI. All rights reserved.   Feet Heel-Toe "Tandem"    Arms as needed for balance, walk a straight line bringing one foot directly in front of the other and then walk a straight line backwards bringing one foot directly behind the other one.  Repeat for 3 laps each way. Do _1-2_ sessions per day.  Copyright  VHI. All rights reserved.            PT Education - 05/03/16 1446    Education provided Yes   Education Details HEP for strengthening, balance and flexibility; logroll for in/out of bed to decrease back pain   Person(s) Educated Patient  Methods Explanation;Demonstration;Verbal cues;Handout   Comprehension Verbalized understanding;Returned demonstration;Need further instruction          PT Short Term Goals - 04/26/16 1808      PT SHORT TERM GOAL #1   Title Patient verbalizes & demonstrates understanding of initial HEP. (Target Date: 05/28/2016)   Time 1   Period Months   Status New     PT SHORT TERM GOAL #2   Title Patient ambulates 300' with LRAD with supervision. (Target Date: 05/28/2016)   Time 1   Period Months   Status New     PT SHORT TERM GOAL #3   Title Timed Up & Go without device <25 sec safely. (Target Date: 05/28/2016)   Time 1   Period Months   Status New           PT Long Term Goals - 04/27/16 0804      PT LONG TERM GOAL #1   Title patient is independent in ongoing HEP / fitness plan including resuming aquatics program. (Target Date: 06/25/2016)   Time 2   Period Months   Status New     PT LONG TERM GOAL #2   Title Patient reports pain increases </= 2 increments on 0-10 scale with standing & gait activities. (Target Date: 06/25/2016)   Time 2   Period Months   Status New     PT LONG TERM GOAL #3   Title Berg Balance >38/56 to indicate lower fall risk. (Target Date: 06/25/2016)   Time 2   Period Months   Status New     PT LONG TERM GOAL #4    Title Timed Up & Go without device <20 sec to indicate lower fall risk. (Target Date: 06/25/2016)   Time 2   Period Months   Status New     PT LONG TERM GOAL #5   Title Patient ambulates 500' with LRAD outdoors including ramps & curbs modified independent. (Target Date: 06/25/2016)   Time 2   Period Months   Status New            Plan - 05/03/16 1411    Clinical Impression Statement today's skilled session addressed education on bed mobility to decrease lumbar/cervical strain with getting in/out of bed. Also established HEP with pt reported no increase in pain with performance in session today. Pt reports not using rollator because it's too heavey to lift into the car and she does not own a regular RW with 5 inch wheels. ? if this would be beneficial to pt, will discuss with primary PT. Pt is making steady progress toward goals and should benefit from continued PT to progress toward unmet goals.    Rehab Potential Good   Clinical Impairments Affecting Rehab Potential arthritis, asthma, LBP, DM, peripheral neuropathy, fibromylagia, HTN   PT Frequency 2x / week   PT Duration Other (comment)  9 weeks (60 days)   PT Treatment/Interventions ADLs/Self Care Home Management;Electrical Stimulation;Moist Heat;Ultrasound;DME Instruction;Gait training;Stair training;Functional mobility training;Therapeutic activities;Therapeutic exercise;Balance training;Neuromuscular re-education;Patient/family education   PT Next Visit Plan continued to work on core/LE strengthening, balance and gait with RW/rollator.    Consulted and Agree with Plan of Care Patient      Patient will benefit from skilled therapeutic intervention in order to improve the following deficits and impairments:  Abnormal gait, Decreased activity tolerance, Decreased balance, Decreased endurance, Decreased knowledge of use of DME, Decreased mobility, Impaired flexibility, Postural dysfunction, Obesity, Pain  Visit Diagnosis: Muscle  weakness (generalized)  Chronic midline  low back pain, with sciatica presence unspecified  Other abnormalities of gait and mobility  Repeated falls  Unsteadiness on feet  Cervicalgia     Problem List Patient Active Problem List   Diagnosis Date Noted  . Adjustment disorder with mixed anxiety and depressed mood 03/05/2016  . Weakness 03/05/2016  . Cervical disc disorder with radiculopathy of cervical region 02/27/2016  . Degenerative arthritis of right knee 02/27/2016  . Right knee pain 02/24/2016  . Left shoulder pain 02/24/2016  . Cough 02/24/2016  . Routine general medical examination at a health care facility 12/12/2015  . Schatzki's ring   . Diarrhea 06/01/2015  . Lumbar radiculopathy 03/05/2015  . Peripheral neuropathy ([place]) 01/17/2015  . Fibromyalgia 02/21/2011  . Essential hypertension 12/04/2006  . Hx of diabetes mellitus 10/20/2006  . Morbid obesity ([place]) 10/20/2006  . MDD (major depressive disorder) ([place]) 10/20/2006  . OBSTRUCTIVE SLEEP APNEA 10/20/2006  . OSTEOARTHRITIS 10/20/2006  . DVT, HX OF 10/20/2006    Willow Ora, PTA, [facility] [address], [place], [place] [address] [phone] 05/03/16, 3:30 PM   Name: [name] MRN: [id number] Date of Birth: 09/23/1955

## 2016-05-03 NOTE — Patient Instructions (Addendum)
Pelvic Tilt: Posterior - Legs Bent (Supine)    Tighten stomach and flatten back by rolling pelvis down. Hold __5__ seconds. Relax. Repeat _10_ times per set. Do _1__ sets per session. Do _1-2_ sessions per day.  http://orth.exer.us/202   Copyright  VHI. All rights reserved.   Knee-to-Chest Stretch: Unilateral    With hand/towel behind right knee, pull knee in to chest until a comfortable stretch is felt in lower back and buttocks. Keep back relaxed. Hold _20_ seconds. Repeat __3__ times each leg. Do _1_ sets per session. Do _1-2_ sessions per day.  http://orth.exer.us/126   Copyright  VHI. All rights reserved.    Bridge    Lie as pictured: with tight tummy (belly button to spine) lift hips up by pressing down through legs and arms. Slowly lower hips back down.  Repeat _10_ times. Do _1-2_ sessions per day.  http://pm.exer.us/55   Copyright  VHI. All rights reserved.   Functional Quadriceps: Sit to Stand    Sit on edge of chair, feet flat on floor. Stand upright, extending knees fully. Repeat _10_ times per set. Do _1_ sets per session. Do _1-2_ sessions per day.  http://orth.exer.us/735   Copyright  VHI. All rights reserved.   Perform these at kitchen counter top for balance assist as needed:  Side-Stepping    With light support on counter top: side step toward one side and then back to the other side. Keep hips and feet pointed at counter top. Make sure to lift foot with stepping and not slide it.  Repeat for 3 laps each way. Do __1-2__ sessions per day.  Copyright  VHI. All rights reserved.   Feet Heel-Toe "Tandem"    Arms as needed for balance, walk a straight line bringing one foot directly in front of the other and then walk a straight line backwards bringing one foot directly behind the other one.  Repeat for 3 laps each way. Do _1-2_ sessions per day.  Copyright  VHI. All rights reserved.

## 2016-05-04 ENCOUNTER — Encounter: Payer: Self-pay | Admitting: Family Medicine

## 2016-05-06 ENCOUNTER — Ambulatory Visit: Payer: Medicare HMO | Admitting: Physical Therapy

## 2016-05-07 ENCOUNTER — Encounter: Payer: Self-pay | Admitting: Internal Medicine

## 2016-05-07 DIAGNOSIS — G8929 Other chronic pain: Secondary | ICD-10-CM

## 2016-05-07 DIAGNOSIS — M549 Dorsalgia, unspecified: Principal | ICD-10-CM

## 2016-05-10 ENCOUNTER — Encounter: Payer: Self-pay | Admitting: Physical Therapy

## 2016-05-10 ENCOUNTER — Ambulatory Visit: Payer: Medicare HMO | Admitting: Physical Therapy

## 2016-05-10 VITALS — HR 79 | Resp 16

## 2016-05-10 DIAGNOSIS — R2689 Other abnormalities of gait and mobility: Secondary | ICD-10-CM | POA: Diagnosis not present

## 2016-05-10 DIAGNOSIS — R296 Repeated falls: Secondary | ICD-10-CM

## 2016-05-10 DIAGNOSIS — R2681 Unsteadiness on feet: Secondary | ICD-10-CM | POA: Diagnosis not present

## 2016-05-10 DIAGNOSIS — M6281 Muscle weakness (generalized): Secondary | ICD-10-CM

## 2016-05-10 DIAGNOSIS — M545 Low back pain: Secondary | ICD-10-CM | POA: Diagnosis not present

## 2016-05-10 DIAGNOSIS — G8929 Other chronic pain: Secondary | ICD-10-CM | POA: Diagnosis not present

## 2016-05-10 DIAGNOSIS — M542 Cervicalgia: Secondary | ICD-10-CM | POA: Diagnosis not present

## 2016-05-11 NOTE — Therapy (Signed)
[facility] [address] [place], [place], [address] Phone: [phone]   Fax:  [phone]  Physical Therapy Treatment  Patient Details  Name: [name] MRN: [id number] Date of Birth: [birth year] Referring Provider: Hulan Saas, DO Pricilla Holm, PCP)  Encounter Date: 05/10/2016      PT End of Session - 05/10/16 1620    Visit Number 3   Number of Visits 18   Date for PT Re-Evaluation 06/25/16   Authorization Type Medicare G-Code & progress note every 10 visits.   PT Start Time 1320   PT Stop Time 1400   PT Time Calculation (min) 40 min   Equipment Utilized During Treatment Gait belt   Activity Tolerance Patient limited by fatigue;Patient limited by pain   Behavior During Therapy [facility] for tasks assessed/performed      Past Medical History:  Diagnosis Date  . Anemia    takes Ferrous Sulfate daily  . Anxiety    takes Citalopram daily  . Arthritis   . Asthma    2004-prior to gastric bypass and no problems since  . CHF (congestive heart failure) (HCC)    takes Lasix daily as needed  . Chronic back pain    spondylolisthesis/stenosis/radiculopathy  . Complication of anesthesia yrs ago   slow to wake up  . Depression   . Diabetes ([place])   . DVT (deep venous thrombosis) ([place]) 01-17-13   past hx. -tx.5-6 yrs ago bilateral legs, occ. sporadic swelling, has IVC filter implanted  . Dysrhythmia    "heart tends to flutter"  . Fibromyalgia   . Fracture    right foot and is in a cam boot  . Gallstones   . GERD (gastroesophageal reflux disease)    hx of-no meds now  . Heart murmur   . History of bronchitis 2012 or 2013  . History of colon polyps   . Hypertension    takes Metoprolol daily  . Insomnia    takes Melatonin daily  . Joint pain   . Joint swelling   . Pelvic floor dysfunction   . Peripheral neuropathy ([place])   . Pneumonia 90's   hx of  . Sleep apnea    no cpap used in many yrs after  weight lost-no machine now  . Urinary frequency   . Urinary urgency   . Vitamin D deficiency     Past Surgical History:  Procedure Laterality Date  . BALLOON DILATION N/A 01/31/2013   Procedure: BALLOON DILATION;  Surgeon: Arta Silence, MD;  [facility];  Service: Endoscopy;  Laterality: N/A;  . CHOLECYSTECTOMY  2007  . COLONOSCOPY    . DILATION AND CURETTAGE OF UTERUS  yrs ago  . ESOPHAGOGASTRODUODENOSCOPY (EGD) WITH PROPOFOL  03/01/2012   Procedure: ESOPHAGOGASTRODUODENOSCOPY (EGD) WITH PROPOFOL;  Surgeon: Arta Silence, MD;  [facility];  Service: Endoscopy;  Laterality: N/A;  . ESOPHAGOGASTRODUODENOSCOPY (EGD) WITH PROPOFOL N/A 01/31/2013   Procedure: ESOPHAGOGASTRODUODENOSCOPY (EGD) WITH PROPOFOL;  Surgeon: Arta Silence, MD;  [facility];  Service: Endoscopy;  Laterality: N/A;  . ESOPHAGOGASTRODUODENOSCOPY (EGD) WITH PROPOFOL N/A 09/02/2015   Procedure: ESOPHAGOGASTRODUODENOSCOPY (EGD) WITH PROPOFOL;  Surgeon: Gatha Mayer, MD;  [facility];  Service: Endoscopy;  Laterality: N/A;  . GASTRIC BY-PASS  2004  . HERNIA REPAIR  2005  . INSERTION OF VENA CAVA FILTER  01-17-13   inserted 2004- "abdomen"  . LIGAMENT REPAIR Right 1987   Rt. knee scope  . MAXIMUM ACCESS (MAS)POSTERIOR LUMBAR INTERBODY FUSION (  PLIF) 2 LEVEL N/A 06/07/2014   Procedure: L4-5 L5-S1 FOR MAXIMUM ACCESS (MAS) POSTERIOR LUMBAR INTERBODY FUSION ;  Surgeon: Erline Levine, MD;  Location: [facility];  Service: Neurosurgery;  Laterality: N/A;  L4-5 L5-S1 FOR MAXIMUM ACCESS (MAS) POSTERIOR LUMBAR INTERBODY FUSION   . TONSILLECTOMY     as child    Vitals:   05/10/16 1322  Pulse: 79  Resp: 16  SpO2: 96%        Subjective Assessment - 05/10/16 1323    Subjective No falls but has limited activities due to snow in area.    Pertinent History arthritis, asthma, LBP, DM, peripheral neuropathy, fibromylagia, HTN   Limitations Lifting;Standing   Patient Stated Goals Get  back to normal routine of daily walking, going to [facility] pool 3x/wk.   Currently in Pain? Yes   Pain Score 6   ride over to PT 3/10   Pain Location Back   Pain Orientation Mid;Lower   Pain Descriptors / Indicators Aching   Pain Type Chronic pain   Pain Onset More than a month ago   Pain Frequency Constant   Aggravating Factors  standing & walking   Pain Relieving Factors medication, heat/ice, stretch   Effect of Pain on Daily Activities limits standing.     Therapeutic Exercise: PT reviewed HEP issued last session. Switched knee to chest to stretch compared to AROM  Self-Care: PT instructed pt in positioning in sidelying with use of pillows to support back using verbal, visual (mirror) and tactile cues. Pt verbalized understanding.  Gait Training: PT instructed pt in use of assistive devices to increase activity level by giving support to decrease energy expenditure & back pain. Breaking walks into 3 levels: short (room to room), medium (in/out of home & limited community) & long (pushing her distance tolerance).  Pt ambulated 250' with single point cane from parking lot to PT gym with increase back pain, SOB & increased balance issues. Patient ambulated 20' including 180* turn with quad tip on straight cane with SBA. Pt reports tip felt awkward so did not appear to help compared to std tip.   Pt ambulated 120' with std RW with cues on position within RW with no noted dyspnea or non-verbal expressions of discomfort. PT instructed in proper use rollator walker including use of brakes with demo. Pt ambulated 120' with rollator walker with SBA with verbal cues with no noted increased dyspnea or non-verbal expressions of discomfort. PT instructed in use of rollator to transport items within house. PT demo loading std RW into car. Pt verbalized understanding.                             PT Education - 05/10/16 1330    Education provided Yes   Education Details  increasing activity level with varying assistive device   Person(s) Educated Patient   Methods Explanation   Comprehension Verbalized understanding          PT Short Term Goals - 05/10/16 1620      PT SHORT TERM GOAL #1   Title Patient verbalizes & demonstrates understanding of initial HEP. (Target Date: 05/28/2016)   Time 1   Period Months   Status On-going     PT SHORT TERM GOAL #2   Title Patient ambulates 300' with LRAD with supervision. (Target Date: 05/28/2016)   Time 1   Period Months   Status On-going     PT SHORT TERM GOAL #  3   Title Timed Up & Go without device <25 sec safely. (Target Date: 05/28/2016)   Time 1   Period Months   Status On-going           PT Long Term Goals - 05/10/16 1621      PT LONG TERM GOAL #1   Title patient is independent in ongoing HEP / fitness plan including resuming aquatics program. (Target Date: 06/25/2016)   Time 2   Period Months   Status On-going     PT LONG TERM GOAL #2   Title Patient reports pain increases </= 2 increments on 0-10 scale with standing & gait activities. (Target Date: 06/25/2016)   Time 2   Period Months   Status On-going     PT LONG TERM GOAL #3   Title Berg Balance >38/56 to indicate lower fall risk. (Target Date: 06/25/2016)   Time 2   Period Months   Status On-going     PT LONG TERM GOAL #4   Title Timed Up & Go without device <20 sec to indicate lower fall risk. (Target Date: 06/25/2016)   Time 2   Period Months   Status On-going     PT LONG TERM GOAL #5   Title Patient ambulates 500' with LRAD outdoors including ramps & curbs modified independent. (Target Date: 06/25/2016)   Time 2   Period Months   Status On-going               Plan - 05/10/16 1621    Clinical Impression Statement patient appears to have less pain & fatigue with use of walker. Pt appears to understand how use of an assistive device can help her mobility.    Rehab Potential Good   Clinical Impairments Affecting Rehab  Potential arthritis, asthma, LBP, DM, peripheral neuropathy, fibromylagia, HTN   PT Frequency 2x / week   PT Duration Other (comment)  9 weeks (60 days)   PT Treatment/Interventions ADLs/Self Care Home Management;Electrical Stimulation;Moist Heat;Ultrasound;DME Instruction;Gait training;Stair training;Functional mobility training;Therapeutic activities;Therapeutic exercise;Balance training;Neuromuscular re-education;Patient/family education   PT Next Visit Plan gait with std RW & rollator on barriers, continued to work on core/LE strengthening & balance   Consulted and Agree with Plan of Care Patient      Patient will benefit from skilled therapeutic intervention in order to improve the following deficits and impairments:  Abnormal gait, Decreased activity tolerance, Decreased balance, Decreased endurance, Decreased knowledge of use of DME, Decreased mobility, Impaired flexibility, Postural dysfunction, Obesity, Pain  Visit Diagnosis: Muscle weakness (generalized)  Chronic midline low back pain, with sciatica presence unspecified  Other abnormalities of gait and mobility  Repeated falls  Unsteadiness on feet     Problem List Patient Active Problem List   Diagnosis Date Noted  . Adjustment disorder with mixed anxiety and depressed mood 03/05/2016  . Weakness 03/05/2016  . Cervical disc disorder with radiculopathy of cervical region 02/27/2016  . Degenerative arthritis of right knee 02/27/2016  . Right knee pain 02/24/2016  . Left shoulder pain 02/24/2016  . Cough 02/24/2016  . Routine general medical examination at a health care facility 12/12/2015  . Schatzki's ring   . Diarrhea 06/01/2015  . Lumbar radiculopathy 03/05/2015  . Peripheral neuropathy ([place]) 01/17/2015  . Fibromyalgia 02/21/2011  . Essential hypertension 12/04/2006  . Hx of diabetes mellitus 10/20/2006  . Morbid obesity ([place]) 10/20/2006  . MDD (major depressive disorder) ([place]) 10/20/2006  . OBSTRUCTIVE SLEEP  APNEA 10/20/2006  . OSTEOARTHRITIS 10/20/2006  . DVT,  HX OF 10/20/2006    Jamey Reas PT, DPT 05/11/2016, 6:24 AM  [facility] [address] [place], [place], [address] Phone: [phone]   Fax:  [phone]  Name: [name] MRN: [id number] Date of Birth: 1955/07/24

## 2016-05-13 ENCOUNTER — Ambulatory Visit: Payer: Medicare HMO | Admitting: Physical Therapy

## 2016-05-14 ENCOUNTER — Other Ambulatory Visit: Payer: Self-pay | Admitting: Internal Medicine

## 2016-05-14 ENCOUNTER — Ambulatory Visit: Payer: Medicare HMO | Admitting: Physical Therapy

## 2016-05-17 ENCOUNTER — Encounter: Payer: Self-pay | Admitting: Physical Therapy

## 2016-05-17 ENCOUNTER — Ambulatory Visit: Payer: Medicare HMO | Admitting: Physical Therapy

## 2016-05-17 DIAGNOSIS — G8929 Other chronic pain: Secondary | ICD-10-CM

## 2016-05-17 DIAGNOSIS — M6281 Muscle weakness (generalized): Secondary | ICD-10-CM | POA: Diagnosis not present

## 2016-05-17 DIAGNOSIS — R2681 Unsteadiness on feet: Secondary | ICD-10-CM

## 2016-05-17 DIAGNOSIS — R296 Repeated falls: Secondary | ICD-10-CM | POA: Diagnosis not present

## 2016-05-17 DIAGNOSIS — M542 Cervicalgia: Secondary | ICD-10-CM | POA: Diagnosis not present

## 2016-05-17 DIAGNOSIS — R2689 Other abnormalities of gait and mobility: Secondary | ICD-10-CM | POA: Diagnosis not present

## 2016-05-17 DIAGNOSIS — M545 Low back pain: Secondary | ICD-10-CM | POA: Diagnosis not present

## 2016-05-17 NOTE — Therapy (Signed)
[facility] [address] [place], [place], [address] Phone: [phone]   Fax:  [phone]  Physical Therapy Treatment  Patient Details  Name: [name] MRN: [id number] Date of Birth: [birth year] Referring Provider: Hulan Saas, DO Pricilla Holm, PCP)  Encounter Date: 05/17/2016      PT End of Session - 05/17/16 1513    Visit Number 4   Number of Visits 18   Date for PT Re-Evaluation 06/25/16   Authorization Type Medicare G-Code & progress note every 10 visits.   PT Start Time 1323   PT Stop Time 1405   PT Time Calculation (min) 42 min   Equipment Utilized During Treatment Gait belt   Activity Tolerance Patient limited by fatigue;Patient limited by pain   Behavior During Therapy WFL for tasks assessed/performed      Past Medical History:  Diagnosis Date  . Anemia    takes Ferrous Sulfate daily  . Anxiety    takes Citalopram daily  . Arthritis   . Asthma    2004-prior to gastric bypass and no problems since  . CHF (congestive heart failure) (HCC)    takes Lasix daily as needed  . Chronic back pain    spondylolisthesis/stenosis/radiculopathy  . Complication of anesthesia yrs ago   slow to wake up  . Depression   . Diabetes ([place])   . DVT (deep venous thrombosis) ([place]) 01-17-13   past hx. -tx.5-6 yrs ago bilateral legs, occ. sporadic swelling, has IVC filter implanted  . Dysrhythmia    "heart tends to flutter"  . Fibromyalgia   . Fracture    right foot and is in a cam boot  . Gallstones   . GERD (gastroesophageal reflux disease)    hx of-no meds now  . Heart murmur   . History of bronchitis 2012 or 2013  . History of colon polyps   . Hypertension    takes Metoprolol daily  . Insomnia    takes Melatonin daily  . Joint pain   . Joint swelling   . Pelvic floor dysfunction   . Peripheral neuropathy ([place])   . Pneumonia 90's   hx of  . Sleep apnea    no cpap used in many yrs after  weight lost-no machine now  . Urinary frequency   . Urinary urgency   . Vitamin D deficiency     Past Surgical History:  Procedure Laterality Date  . BALLOON DILATION N/A 01/31/2013   Procedure: BALLOON DILATION;  Surgeon: Arta Silence, MD;  Location: [facility];  Service: Endoscopy;  Laterality: N/A;  . CHOLECYSTECTOMY  2007  . COLONOSCOPY    . DILATION AND CURETTAGE OF UTERUS  yrs ago  . ESOPHAGOGASTRODUODENOSCOPY (EGD) WITH PROPOFOL  03/01/2012   Procedure: ESOPHAGOGASTRODUODENOSCOPY (EGD) WITH PROPOFOL;  Surgeon: Arta Silence, MD;  [facility];  Service: Endoscopy;  Laterality: N/A;  . ESOPHAGOGASTRODUODENOSCOPY (EGD) WITH PROPOFOL N/A 01/31/2013   Procedure: ESOPHAGOGASTRODUODENOSCOPY (EGD) WITH PROPOFOL;  Surgeon: Arta Silence, MD;  [facility];  Service: Endoscopy;  Laterality: N/A;  . ESOPHAGOGASTRODUODENOSCOPY (EGD) WITH PROPOFOL N/A 09/02/2015   Procedure: ESOPHAGOGASTRODUODENOSCOPY (EGD) WITH PROPOFOL;  Surgeon: Gatha Mayer, MD;  [facility];  Service: Endoscopy;  Laterality: N/A;  . GASTRIC BY-PASS  2004  . HERNIA REPAIR  2005  . INSERTION OF VENA CAVA FILTER  01-17-13   inserted 2004- "abdomen"  . LIGAMENT REPAIR Right 1987   Rt. knee scope  . MAXIMUM ACCESS (MAS)POSTERIOR LUMBAR INTERBODY FUSION (  PLIF) 2 LEVEL N/A 06/07/2014   Procedure: L4-5 L5-S1 FOR MAXIMUM ACCESS (MAS) POSTERIOR LUMBAR INTERBODY FUSION ;  Surgeon: Erline Levine, MD;  Location: [facility];  Service: Neurosurgery;  Laterality: N/A;  L4-5 L5-S1 FOR MAXIMUM ACCESS (MAS) POSTERIOR LUMBAR INTERBODY FUSION   . TONSILLECTOMY     as child    There were no vitals filed for this visit.      Subjective Assessment - 05/17/16 1324    Subjective No falls. She took the wheels off walker for her father to use a while back and now can not get them back on the walker.    Pertinent History arthritis, asthma, LBP, DM, peripheral neuropathy, fibromylagia, HTN   Limitations  Lifting;Standing   Patient Stated Goals Get back to normal routine of daily walking, going to [facility] pool 3x/wk.   Currently in Pain? Yes   Pain Score 8    Pain Location Back   Pain Orientation Mid;Lower   Pain Descriptors / Indicators Aching   Pain Type Chronic pain   Pain Onset More than a month ago   Pain Frequency Constant   Aggravating Factors  standing & walking   Pain Relieving Factors medication, heat /ice, stretch   Effect of Pain on Daily Activities limits standing   Multiple Pain Sites Yes   Pain Score 8   Pain Location Neck   Pain Orientation Mid;Lower   Pain Descriptors / Indicators Aching   Pain Type Chronic pain   Pain Onset More than a month ago   Pain Frequency Constant   Aggravating Factors  sitting   Pain Relieving Factors meds     Discussion on standard RW & rollator. She thinks standard RW for community because lighter and easier to load in car. And plans to use rollator in home to aide with moving items within her home. Patient owns both styles already. Pt to bring her std RW next session for PT to put the wheels back on walker.   Therapeutic Exercise: Supine: posterior pelvic tilt (ppt) 5 sec hold 10 reps,  knee to chest stretch increased back pain,  gentle / small range trunk rotation stretch 15 sec hold 2 reps each direction ppt with hip rotation 5 reps ppt with heel slides 5 reps  Sitting in chair with armrests: Knee to chest using 10" foot stool 20 sec hold 2 reps each LE Double knee to chest with feet on stool 20 sec hold 1 rep Upper back rotation 20 sec hold 2 reps each direction  Seated on 65cm therapeutic ball with chair back on each side for UE support and mat table posteriorly for safety: Rolling ball ant./post Rolling ball laterally Rolling ball clockwise & counterclockwise Gentle bouncing Shoulder flexion / arm raise with unilateral support Knee extension / stepping out & back.   Pt ambulated 100' to lobby with RW with less back pain.                             PT Short Term Goals - 05/10/16 1620      PT SHORT TERM GOAL #1   Title Patient verbalizes & demonstrates understanding of initial HEP. (Target Date: 05/28/2016)   Time 1   Period Months   Status On-going     PT SHORT TERM GOAL #2   Title Patient ambulates 300' with LRAD with supervision. (Target Date: 05/28/2016)   Time 1   Period Months   Status On-going  PT SHORT TERM GOAL #3   Title Timed Up & Go without device <25 sec safely. (Target Date: 05/28/2016)   Time 1   Period Months   Status On-going           PT Long Term Goals - 05/10/16 1621      PT LONG TERM GOAL #1   Title patient is independent in ongoing HEP / fitness plan including resuming aquatics program. (Target Date: 06/25/2016)   Time 2   Period Months   Status On-going     PT LONG TERM GOAL #2   Title Patient reports pain increases </= 2 increments on 0-10 scale with standing & gait activities. (Target Date: 06/25/2016)   Time 2   Period Months   Status On-going     PT LONG TERM GOAL #3   Title Berg Balance >38/56 to indicate lower fall risk. (Target Date: 06/25/2016)   Time 2   Period Months   Status On-going     PT LONG TERM GOAL #4   Title Timed Up & Go without device <20 sec to indicate lower fall risk. (Target Date: 06/25/2016)   Time 2   Period Months   Status On-going     PT LONG TERM GOAL #5   Title Patient ambulates 500' with LRAD outdoors including ramps & curbs modified independent. (Target Date: 06/25/2016)   Time 2   Period Months   Status On-going               Plan - 05/17/16 1513    Clinical Impression Statement Patient had increased back & neck pain today upon arrival to session. She had less pain by end of session but movements during the session were limited by pain.    Rehab Potential Good   Clinical Impairments Affecting Rehab Potential arthritis, asthma, LBP, DM, peripheral neuropathy, fibromylagia, HTN   PT Frequency 2x /  week   PT Duration Other (comment)  9 weeks (60 days)   PT Treatment/Interventions ADLs/Self Care Home Management;Electrical Stimulation;Moist Heat;Ultrasound;DME Instruction;Gait training;Stair training;Functional mobility training;Therapeutic activities;Therapeutic exercise;Balance training;Neuromuscular re-education;Patient/family education   PT Next Visit Plan gait with std RW & rollator on barriers, continued to work on core/LE strengthening & balance   Consulted and Agree with Plan of Care Patient      Patient will benefit from skilled therapeutic intervention in order to improve the following deficits and impairments:  Abnormal gait, Decreased activity tolerance, Decreased balance, Decreased endurance, Decreased knowledge of use of DME, Decreased mobility, Impaired flexibility, Postural dysfunction, Obesity, Pain  Visit Diagnosis: Muscle weakness (generalized)  Chronic midline low back pain, with sciatica presence unspecified  Other abnormalities of gait and mobility  Unsteadiness on feet     Problem List Patient Active Problem List   Diagnosis Date Noted  . Adjustment disorder with mixed anxiety and depressed mood 03/05/2016  . Weakness 03/05/2016  . Cervical disc disorder with radiculopathy of cervical region 02/27/2016  . Degenerative arthritis of right knee 02/27/2016  . Right knee pain 02/24/2016  . Left shoulder pain 02/24/2016  . Cough 02/24/2016  . Routine general medical examination at a health care facility 12/12/2015  . Schatzki's ring   . Diarrhea 06/01/2015  . Lumbar radiculopathy 03/05/2015  . Peripheral neuropathy ([place]) 01/17/2015  . Fibromyalgia 02/21/2011  . Essential hypertension 12/04/2006  . Hx of diabetes mellitus 10/20/2006  . Morbid obesity ([place]) 10/20/2006  . MDD (major depressive disorder) ([place]) 10/20/2006  . OBSTRUCTIVE SLEEP APNEA 10/20/2006  . OSTEOARTHRITIS 10/20/2006  .  DVT, HX OF 10/20/2006    Jamey Reas PT, DPT 05/17/2016, 3:20  PM  [facility] [address] [place], [place], [address] Phone: [phone]   Fax:  [phone]  Name: [name] MRN: [id number] Date of Birth: 01-01-1956

## 2016-05-20 ENCOUNTER — Ambulatory Visit: Payer: Medicare HMO | Attending: Family Medicine | Admitting: Physical Therapy

## 2016-05-20 DIAGNOSIS — R296 Repeated falls: Secondary | ICD-10-CM | POA: Insufficient documentation

## 2016-05-20 DIAGNOSIS — R2681 Unsteadiness on feet: Secondary | ICD-10-CM | POA: Insufficient documentation

## 2016-05-20 DIAGNOSIS — R2689 Other abnormalities of gait and mobility: Secondary | ICD-10-CM | POA: Insufficient documentation

## 2016-05-20 DIAGNOSIS — M6281 Muscle weakness (generalized): Secondary | ICD-10-CM | POA: Insufficient documentation

## 2016-05-20 DIAGNOSIS — M545 Low back pain: Secondary | ICD-10-CM | POA: Insufficient documentation

## 2016-05-20 DIAGNOSIS — G8929 Other chronic pain: Secondary | ICD-10-CM | POA: Insufficient documentation

## 2016-05-24 ENCOUNTER — Ambulatory Visit: Payer: Medicare HMO | Admitting: Physical Therapy

## 2016-05-26 ENCOUNTER — Ambulatory Visit (INDEPENDENT_AMBULATORY_CARE_PROVIDER_SITE_OTHER): Payer: Medicare HMO | Admitting: Internal Medicine

## 2016-05-26 ENCOUNTER — Encounter: Payer: Self-pay | Admitting: Internal Medicine

## 2016-05-26 ENCOUNTER — Other Ambulatory Visit: Payer: Medicare HMO

## 2016-05-26 VITALS — BP 140/90 | HR 73 | Temp 98.3°F | Ht 67.0 in | Wt 243.0 lb

## 2016-05-26 DIAGNOSIS — R300 Dysuria: Secondary | ICD-10-CM | POA: Insufficient documentation

## 2016-05-26 LAB — POC URINALSYSI DIPSTICK (AUTOMATED)
Bilirubin, UA: NEGATIVE
GLUCOSE UA: NEGATIVE
KETONES UA: NEGATIVE
Leukocytes, UA: NEGATIVE
Nitrite, UA: NEGATIVE
RBC UA: NEGATIVE
SPEC GRAV UA: 1.025
Urobilinogen, UA: 0.2
pH, UA: 6

## 2016-05-26 MED ORDER — PHENAZOPYRIDINE HCL 100 MG PO TABS: 100.0000 mg | ORAL_TABLET | Freq: Three times a day (TID) | ORAL | 0 refills | Status: DC | PRN

## 2016-05-26 NOTE — Patient Instructions (Signed)
We have sent in the pyridium for the pain which you can take up to 3 times per day.   We have sent this off for culture to check for bacteria.

## 2016-05-26 NOTE — Progress Notes (Signed)
Pre visit review using our clinic review tool, if applicable. No additional management support is needed unless otherwise documented below in the visit note. 

## 2016-05-26 NOTE — Assessment & Plan Note (Signed)
U/A in the office without signs of infection. Sent for culture to rule out infection. Rx for pyridium sent in today for the pain. Advised to continue to drink more fluids.

## 2016-05-26 NOTE — Progress Notes (Signed)
   Subjective:    Patient ID: [name], female    DOB: Nov 24, 1955, 61 y.o.   MRN: [id number]  HPI The patient is a 61 YO female coming in for urinary pain and frequency. She is also having urgency. Symptoms started about 3 days ago. She denies fevers or chills. No back pain or abdominal pain. Overall stable since onset. She has taken cranberry juice and is trying to drink  More fluids since the symptoms started and it is hard to tell if this is helping.   Review of Systems  Constitutional: Negative for activity change, appetite change, chills, fatigue, fever and unexpected weight change.  Respiratory: Negative.   Cardiovascular: Negative.   Gastrointestinal: Negative.   Genitourinary: Positive for dysuria, frequency and urgency. Negative for decreased urine volume, difficulty urinating, flank pain, vaginal bleeding, vaginal discharge and vaginal pain.  Musculoskeletal: Negative.       Objective:   Physical Exam  Constitutional: She is oriented to person, place, and time. She appears well-developed and well-nourished.  HENT:  Head: Normocephalic and atraumatic.  Eyes: EOM are normal.  Neck: Normal range of motion.  Cardiovascular: Normal rate and regular rhythm.   Pulmonary/Chest: Effort normal.  Abdominal: Soft. Bowel sounds are normal. She exhibits no distension. There is no tenderness. There is no rebound and no guarding.  Neurological: She is alert and oriented to person, place, and time.  Skin: Skin is warm and dry.   Vitals:   05/26/16 0905  BP: 140/90  Pulse: 73  Temp: 98.3 F (36.8 C)  TempSrc: Oral  SpO2: 99%  Weight: 243 lb (110.2 kg)  Height: 5\' 7"  (1.702 m)      Assessment & Plan:

## 2016-05-27 ENCOUNTER — Ambulatory Visit: Payer: Medicare HMO | Admitting: Physical Therapy

## 2016-05-28 LAB — URINE CULTURE

## 2016-05-31 ENCOUNTER — Telehealth: Payer: Self-pay | Admitting: Physical Therapy

## 2016-05-31 ENCOUNTER — Ambulatory Visit: Payer: Medicare HMO | Admitting: Physical Therapy

## 2016-05-31 NOTE — Telephone Encounter (Signed)
Called pt due to consecutive no show x 4.  Pt reports father passed away yesterday and has been busy taking care of this.  Requested to cx later this week.  Reminded of next appointment time and need to call office if she needs to cx.  Pt verbalized understanding.

## 2016-06-03 ENCOUNTER — Ambulatory Visit: Payer: Medicare HMO | Admitting: Physical Therapy

## 2016-06-07 ENCOUNTER — Ambulatory Visit: Payer: Medicare HMO | Admitting: Physical Therapy

## 2016-06-07 DIAGNOSIS — R2681 Unsteadiness on feet: Secondary | ICD-10-CM

## 2016-06-07 DIAGNOSIS — R2689 Other abnormalities of gait and mobility: Secondary | ICD-10-CM

## 2016-06-07 DIAGNOSIS — M545 Low back pain: Secondary | ICD-10-CM | POA: Diagnosis not present

## 2016-06-07 DIAGNOSIS — G8929 Other chronic pain: Secondary | ICD-10-CM

## 2016-06-07 DIAGNOSIS — M6281 Muscle weakness (generalized): Secondary | ICD-10-CM | POA: Diagnosis not present

## 2016-06-07 DIAGNOSIS — R296 Repeated falls: Secondary | ICD-10-CM

## 2016-06-07 NOTE — Therapy (Signed)
[name] [address] [place], [place], [address] Phone: [phone]   Fax:  [phone]  Physical Therapy Treatment  Patient Details  Name: [name] MRN: [id number] Date of Birth: [birth year] Referring Provider: Hulan Saas, DO Pricilla Holm, PCP)  Encounter Date: 06/07/2016      PT End of Session - 06/07/16 1443    Visit Number 5   Number of Visits 18   Date for PT Re-Evaluation 06/25/16   Authorization Type Medicare G-Code & progress note every 10 visits.   PT Start Time 1401   PT Stop Time 1440   PT Time Calculation (min) 39 min   Equipment Utilized During Treatment Gait belt   Activity Tolerance Patient tolerated treatment well;Patient limited by pain   Behavior During Therapy WFL for tasks assessed/performed      Past Medical History:  Diagnosis Date  . Anemia    takes Ferrous Sulfate daily  . Anxiety    takes Citalopram daily  . Arthritis   . Asthma    2004-prior to gastric bypass and no problems since  . CHF (congestive heart failure) (HCC)    takes Lasix daily as needed  . Chronic back pain    spondylolisthesis/stenosis/radiculopathy  . Complication of anesthesia yrs ago   slow to wake up  . Depression   . Diabetes ([place])   . DVT (deep venous thrombosis) ([place]) 01-17-13   past hx. -tx.5-6 yrs ago bilateral legs, occ. sporadic swelling, has IVC filter implanted  . Dysrhythmia    "heart tends to flutter"  . Fibromyalgia   . Fracture    right foot and is in a cam boot  . Gallstones   . GERD (gastroesophageal reflux disease)    hx of-no meds now  . Heart murmur   . History of bronchitis 2012 or 2013  . History of colon polyps   . Hypertension    takes Metoprolol daily  . Insomnia    takes Melatonin daily  . Joint pain   . Joint swelling   . Pelvic floor dysfunction   . Peripheral neuropathy ([place])   . Pneumonia 90's   hx of  . Sleep apnea    no cpap used in many yrs  after weight lost-no machine now  . Urinary frequency   . Urinary urgency   . Vitamin D deficiency     Past Surgical History:  Procedure Laterality Date  . BALLOON DILATION N/A 01/31/2013   Procedure: BALLOON DILATION;  Surgeon: Arta Silence, MD;  [facility];  Service: Endoscopy;  Laterality: N/A;  . CHOLECYSTECTOMY  2007  . COLONOSCOPY    . DILATION AND CURETTAGE OF UTERUS  yrs ago  . ESOPHAGOGASTRODUODENOSCOPY (EGD) WITH PROPOFOL  03/01/2012   Procedure: ESOPHAGOGASTRODUODENOSCOPY (EGD) WITH PROPOFOL;  Surgeon: Arta Silence, MD;  [facility];  Service: Endoscopy;  Laterality: N/A;  . ESOPHAGOGASTRODUODENOSCOPY (EGD) WITH PROPOFOL N/A 01/31/2013   Procedure: ESOPHAGOGASTRODUODENOSCOPY (EGD) WITH PROPOFOL;  Surgeon: Arta Silence, MD;  [facility];  Service: Endoscopy;  Laterality: N/A;  . ESOPHAGOGASTRODUODENOSCOPY (EGD) WITH PROPOFOL N/A 09/02/2015   Procedure: ESOPHAGOGASTRODUODENOSCOPY (EGD) WITH PROPOFOL;  Surgeon: Gatha Mayer, MD;  [facility];  Service: Endoscopy;  Laterality: N/A;  . GASTRIC BY-PASS  2004  . HERNIA REPAIR  2005  . INSERTION OF VENA CAVA FILTER  01-17-13   inserted 2004- "abdomen"  . LIGAMENT REPAIR Right 1987   Rt. knee scope  . MAXIMUM ACCESS (MAS)POSTERIOR LUMBAR INTERBODY FUSION (  PLIF) 2 LEVEL N/A 06/07/2014   Procedure: L4-5 L5-S1 FOR MAXIMUM ACCESS (MAS) POSTERIOR LUMBAR INTERBODY FUSION ;  Surgeon: Erline Levine, MD;  Location: [facility];  Service: Neurosurgery;  Laterality: N/A;  L4-5 L5-S1 FOR MAXIMUM ACCESS (MAS) POSTERIOR LUMBAR INTERBODY FUSION   . TONSILLECTOMY     as child    There were no vitals filed for this visit.      Subjective Assessment - 06/07/16 1405    Subjective doing okay, father passed away last week.  back pain is about the same.  goes to surgeon (Dr. Vertell Limber) on Wednesday, so needs to cx Wednesday   Pertinent History arthritis, asthma, LBP, DM, peripheral neuropathy,  fibromylagia, HTN   Patient Stated Goals Get back to normal routine of daily walking, going to [facility] pool 3x/wk.   Currently in Pain? Yes   Pain Score 8    Pain Location Back   Pain Orientation Mid;Lower   Pain Descriptors / Indicators Pins and needles   Pain Type Chronic pain   Pain Radiating Towards down both legs   Pain Onset More than a month ago   Pain Frequency Constant   Aggravating Factors  standing and walking   Pain Relieving Factors meds, heat/ice, stretching            [facility] PT Assessment - 06/07/16 1418      Ambulation/Gait   Ambulation/Gait Yes   Ambulation/Gait Assistance 6: Modified independent (Device/Increase time)   Ambulation Distance (Feet) 150 Feet   Assistive device Straight cane   Gait Pattern Step-through pattern;Decreased arm swing - left;Antalgic     Berg Balance Test   Sit to Stand Able to stand  independently using hands   Standing Unsupported Able to stand safely 2 minutes   Sitting with Back Unsupported but Feet Supported on Floor or Stool Able to sit safely and securely 2 minutes   Stand to Sit Controls descent by using hands   Transfers Able to transfer safely, definite need of hands   Standing Unsupported with Eyes Closed Able to stand 10 seconds with supervision   Standing Ubsupported with Feet Together Able to place feet together independently and stand for 1 minute with supervision   From Standing, Reach Forward with Outstretched Arm Can reach forward >5 cm safely (2")   From Standing Position, Pick up Object from [place] to pick up shoe, needs supervision   From Standing Position, Turn to Look Behind Over each Shoulder Looks behind one side only/other side shows less weight shift   Turn 360 Degrees Able to turn 360 degrees safely but slowly   Standing Unsupported, Alternately Place Feet on Step/Stool Needs assistance to keep from falling or unable to try   Standing Unsupported, One Foot in Front Able to take small step independently and  hold 30 seconds   Standing on One Leg Tries to lift leg/unable to hold 3 seconds but remains standing independently   Total Score 36     Timed Up and Go Test   Normal TUG (seconds) 28.56                     [facility] Adult PT Treatment/Exercise - 06/07/16 1418      Self-Care   Self-Care Other Self-Care Comments   Other Self-Care Comments  discussed current POC and limited attendance with PT.  Pt reports she called to cx appointments but therapist never notified.  Pt to see neurosurgeon Wednesday so may be proceeding with surgical intervention  due to lack of progress.  If MD recommends additional PT will see 1x/wk x 4 wks per pt request to decr freq to 1x/wk                PT Education - 06/07/16 1443    Education provided Yes   Education Details see self care; verbally reviewed HEP   Person(s) Educated Patient   Methods Explanation   Comprehension Verbalized understanding          PT Short Term Goals - 06/07/16 1444      PT SHORT TERM GOAL #1   Title Patient verbalizes & demonstrates understanding of initial HEP. (Target Date: 05/28/2016)   Baseline 06/07/16: verbalizes independently; did not perform   Status Partially Met     PT SHORT TERM GOAL #2   Title Patient ambulates 300' with LRAD with supervision. (Target Date: 05/28/2016)   Status Not Met     PT SHORT TERM GOAL #3   Title Timed Up & Go without device <25 sec safely. (Target Date: 05/28/2016)   Status Not Met           PT Long Term Goals - 05/10/16 1621      PT LONG TERM GOAL #1   Title patient is independent in ongoing HEP / fitness plan including resuming aquatics program. (Target Date: 06/25/2016)   Time 2   Period Months   Status On-going     PT LONG TERM GOAL #2   Title Patient reports pain increases </= 2 increments on 0-10 scale with standing & gait activities. (Target Date: 06/25/2016)   Time 2   Period Months   Status On-going     PT LONG TERM GOAL #3   Title Berg Balance >38/56 to  indicate lower fall risk. (Target Date: 06/25/2016)   Time 2   Period Months   Status On-going     PT LONG TERM GOAL #4   Title Timed Up & Go without device <20 sec to indicate lower fall risk. (Target Date: 06/25/2016)   Time 2   Period Months   Status On-going     PT LONG TERM GOAL #5   Title Patient ambulates 500' with LRAD outdoors including ramps & curbs modified independent. (Target Date: 06/25/2016)   Time 2   Period Months   Status On-going               Plan - 06/07/16 1444    Clinical Impression Statement Pt continues to have elevated LBP affecting balance and mobility.  No STGs met due to increased pain and limited attendance.  Plan to decr frequency to 1x/wk per pt request if additional PT sessions are indicated after seeing neurosurgeon on Wed 06/09/16.   Clinical Impairments Affecting Rehab Potential arthritis, asthma, LBP, DM, peripheral neuropathy, fibromylagia, HTN   PT Frequency 1x / week   PT Duration 4 weeks   PT Treatment/Interventions ADLs/Self Care Home Management;Electrical Stimulation;Moist Heat;Ultrasound;DME Instruction;Gait training;Stair training;Functional mobility training;Therapeutic activities;Therapeutic exercise;Balance training;Neuromuscular re-education;Patient/family education   PT Next Visit Plan gait with std RW & rollator on barriers, continued to work on core/LE strengthening & balance   Consulted and Agree with Plan of Care Patient      Patient will benefit from skilled therapeutic intervention in order to improve the following deficits and impairments:  Abnormal gait, Decreased activity tolerance, Decreased balance, Decreased endurance, Decreased knowledge of use of DME, Decreased mobility, Impaired flexibility, Postural dysfunction, Obesity, Pain  Visit Diagnosis: Muscle weakness (generalized)  Chronic  midline low back pain, with sciatica presence unspecified  Other abnormalities of gait and mobility  Unsteadiness on feet  Repeated  falls     Problem List Patient Active Problem List   Diagnosis Date Noted  . Dysuria 05/26/2016  . Adjustment disorder with mixed anxiety and depressed mood 03/05/2016  . Weakness 03/05/2016  . Cervical disc disorder with radiculopathy of cervical region 02/27/2016  . Degenerative arthritis of right knee 02/27/2016  . Right knee pain 02/24/2016  . Left shoulder pain 02/24/2016  . Cough 02/24/2016  . Routine general medical examination at a health care facility 12/12/2015  . Schatzki's ring   . Diarrhea 06/01/2015  . Lumbar radiculopathy 03/05/2015  . Peripheral neuropathy ([place]) 01/17/2015  . Fibromyalgia 02/21/2011  . Essential hypertension 12/04/2006  . Hx of diabetes mellitus 10/20/2006  . Morbid obesity ([place]) 10/20/2006  . MDD (major depressive disorder) ([place]) 10/20/2006  . OBSTRUCTIVE SLEEP APNEA 10/20/2006  . OSTEOARTHRITIS 10/20/2006  . DVT, HX OF 10/20/2006      Laureen Abrahams, PT, DPT 06/07/16 2:47 PM    [facility] [address] [place], [place], [address] Phone: [phone]   Fax:  [phone]  Name: MEYER DOCKERY MRN: [id number] Date of Birth: 03-16-1956

## 2016-06-09 ENCOUNTER — Ambulatory Visit: Payer: Medicare HMO | Admitting: Physical Therapy

## 2016-06-09 DIAGNOSIS — M5416 Radiculopathy, lumbar region: Secondary | ICD-10-CM | POA: Diagnosis not present

## 2016-06-09 DIAGNOSIS — M4316 Spondylolisthesis, lumbar region: Secondary | ICD-10-CM | POA: Diagnosis not present

## 2016-06-09 DIAGNOSIS — M5127 Other intervertebral disc displacement, lumbosacral region: Secondary | ICD-10-CM | POA: Diagnosis not present

## 2016-06-10 ENCOUNTER — Ambulatory Visit: Payer: Commercial Managed Care - HMO | Admitting: Family Medicine

## 2016-06-10 ENCOUNTER — Ambulatory Visit: Payer: Commercial Managed Care - HMO | Admitting: Internal Medicine

## 2016-06-14 ENCOUNTER — Ambulatory Visit: Payer: Medicare HMO | Admitting: Physical Therapy

## 2016-06-17 ENCOUNTER — Ambulatory Visit: Payer: Medicare HMO | Attending: Family Medicine | Admitting: Rehabilitative and Restorative Service Providers"

## 2016-06-21 ENCOUNTER — Ambulatory Visit: Payer: Medicare HMO | Admitting: Physical Therapy

## 2016-06-24 ENCOUNTER — Ambulatory Visit: Payer: Medicare HMO | Admitting: Physical Therapy

## 2016-07-06 DIAGNOSIS — H5203 Hypermetropia, bilateral: Secondary | ICD-10-CM | POA: Diagnosis not present

## 2016-07-06 DIAGNOSIS — H524 Presbyopia: Secondary | ICD-10-CM | POA: Diagnosis not present

## 2016-07-06 DIAGNOSIS — Z0100 Encounter for examination of eyes and vision without abnormal findings: Secondary | ICD-10-CM | POA: Diagnosis not present

## 2016-07-06 DIAGNOSIS — H52223 Regular astigmatism, bilateral: Secondary | ICD-10-CM | POA: Diagnosis not present

## 2016-07-09 ENCOUNTER — Other Ambulatory Visit (INDEPENDENT_AMBULATORY_CARE_PROVIDER_SITE_OTHER): Payer: Medicare HMO

## 2016-07-09 ENCOUNTER — Ambulatory Visit (INDEPENDENT_AMBULATORY_CARE_PROVIDER_SITE_OTHER): Payer: Medicare HMO | Admitting: Internal Medicine

## 2016-07-09 ENCOUNTER — Encounter: Payer: Self-pay | Admitting: Internal Medicine

## 2016-07-09 ENCOUNTER — Ambulatory Visit: Payer: Self-pay | Admitting: Internal Medicine

## 2016-07-09 VITALS — BP 142/90 | HR 70 | Temp 98.4°F | Resp 16 | Ht 67.0 in | Wt 246.0 lb

## 2016-07-09 DIAGNOSIS — F4323 Adjustment disorder with mixed anxiety and depressed mood: Secondary | ICD-10-CM | POA: Diagnosis not present

## 2016-07-09 DIAGNOSIS — R300 Dysuria: Secondary | ICD-10-CM | POA: Diagnosis not present

## 2016-07-09 DIAGNOSIS — R252 Cramp and spasm: Secondary | ICD-10-CM

## 2016-07-09 LAB — COMPREHENSIVE METABOLIC PANEL
ALT: 28 U/L (ref 0–35)
AST: 27 U/L (ref 0–37)
Albumin: 3.7 g/dL (ref 3.5–5.2)
Alkaline Phosphatase: 99 U/L (ref 39–117)
BILIRUBIN TOTAL: 0.3 mg/dL (ref 0.2–1.2)
BUN: 15 mg/dL (ref 6–23)
CALCIUM: 8.5 mg/dL (ref 8.4–10.5)
CO2: 23 meq/L (ref 19–32)
CREATININE: 0.84 mg/dL (ref 0.40–1.20)
Chloride: 116 mEq/L — ABNORMAL HIGH (ref 96–112)
GFR: 88.14 mL/min (ref >60.00)
GLUCOSE: 77 mg/dL (ref 70–99)
Potassium: 4.3 mEq/L (ref 3.5–5.1)
Sodium: 143 mEq/L (ref 135–145)
TOTAL PROTEIN: 6.2 g/dL (ref 6.0–8.3)

## 2016-07-09 LAB — POC URINALSYSI DIPSTICK (AUTOMATED)
Bilirubin, UA: NEGATIVE
Blood, UA: NEGATIVE
GLUCOSE UA: NEGATIVE
KETONES UA: NEGATIVE
LEUKOCYTES UA: NEGATIVE
NITRITE UA: NEGATIVE
PH UA: 5.5 (ref 5.0–8.0)
Spec Grav, UA: 1.03 (ref 1.030–1.035)
Urobilinogen, UA: NEGATIVE (ref ?–2.0)

## 2016-07-09 LAB — MAGNESIUM: Magnesium: 1.8 mg/dL (ref 1.5–2.5)

## 2016-07-09 MED ORDER — ESCITALOPRAM OXALATE 10 MG PO TABS: 10.0000 mg | ORAL_TABLET | Freq: Every day | ORAL | 0 refills | Status: DC

## 2016-07-09 NOTE — Assessment & Plan Note (Signed)
Checking CMP and magnesium and adjust as needed supplements. Advised to try oral magnesium to decrease muscle cramps.

## 2016-07-09 NOTE — Patient Instructions (Addendum)
We have checked the urine today and there are no signs of infection. We can have you see the urologist to see if there is anything else wrong to cause the discomfort.   We have sent in lexapro which is covered on the insurance to change to from wellbutrin. When you get it you can take it the next day after taking wellbutrin normally and stop the wellbutrin. Do not take both on the same day.

## 2016-07-09 NOTE — Assessment & Plan Note (Signed)
Will switch from wellbutrin to lexapro for better relief. Has done well on effexor in the past without side effects.

## 2016-07-09 NOTE — Progress Notes (Signed)
   Subjective:    Patient ID: [name], female    DOB: 06-03-1955, 61 y.o.   MRN: [id number]  HPI The patient is a 61 YO female coming in for worsening depression. Her father just recently passed in the last month and was on hospice. He had a peaceful passing which is some relief to her but now she is taking care of her mother by herself. She does not feel that the wellbutrin is working anymore and she is sleeping more during the day and not at night time. She is eating okay. Not wanting to hang out with friends and is crying some. She is wanting to switch agents if possible for better control. She is seeing a therapist and they are helping her some as well.  She is also having some dysuria which is persistent from last visit. She was checked for infection last visit and did not have infection and was given pyridium to take. This helped temporarily. She started having some burning and urgency in the last 1-2 weeks. She does have some incontinence with the urgency.  Next concern is for muscle cramps. She is having more cramps and in the daytime instead of just in the night time. She is worried about potassium levels as they have not been checked in some time. She is eating and drinking normally. Maybe not as hydrated as she should be. No fevers or chills. No injury to her legs or muscles. No overuse that she knows of.   Review of Systems  Constitutional: Positive for activity change and fatigue. Negative for appetite change, chills, fever and unexpected weight change.  HENT: Negative.   Eyes: Negative.   Respiratory: Negative.   Cardiovascular: Negative.   Gastrointestinal: Negative.   Genitourinary: Positive for dysuria, frequency and urgency. Negative for difficulty urinating, dyspareunia, hematuria, menstrual problem and pelvic pain.  Musculoskeletal: Positive for arthralgias and myalgias. Negative for back pain, gait problem, neck pain and neck stiffness.  Skin: Negative.     Neurological: Negative.   Psychiatric/Behavioral: Positive for decreased concentration, dysphoric mood and sleep disturbance. Negative for agitation, confusion, self-injury and suicidal ideas. The patient is nervous/anxious. The patient is not hyperactive.       Objective:   Physical Exam  Constitutional: She is oriented to person, place, and time. She appears well-developed and well-nourished.  HENT:  Head: Normocephalic and atraumatic.  Right Ear: External ear normal.  Left Ear: External ear normal.  Eyes: EOM are normal.  Neck: Normal range of motion.  Cardiovascular: Normal rate and regular rhythm.   Pulmonary/Chest: Effort normal. No respiratory distress. She has no wheezes. She has no rales.  Abdominal: Soft. Bowel sounds are normal.  No suprapubic tenderness or pain.   Musculoskeletal: She exhibits no tenderness.  Neurological: She is alert and oriented to person, place, and time.  Skin: Skin is warm and dry.  Psychiatric:  Mood somewhat flat and appropriate crying.    Vitals:   07/09/16 1258  BP: (!) 142/90  Pulse: 70  Resp: 16  Temp: 98.4 F (36.9 C)  TempSrc: Oral  SpO2: 100%  Weight: 246 lb (111.6 kg)  Height: 5\' 7"  (1.702 m)      Assessment & Plan:

## 2016-07-09 NOTE — Assessment & Plan Note (Signed)
U/A again without signs of infection and will refer to urology for evaluation.

## 2016-07-09 NOTE — Progress Notes (Signed)
Pre visit review using our clinic review tool, if applicable. No additional management support is needed unless otherwise documented below in the visit note. 

## 2016-07-15 ENCOUNTER — Other Ambulatory Visit: Payer: Self-pay | Admitting: Neurosurgery

## 2016-07-15 DIAGNOSIS — M5416 Radiculopathy, lumbar region: Secondary | ICD-10-CM

## 2016-07-25 ENCOUNTER — Encounter: Payer: Self-pay | Admitting: Internal Medicine

## 2016-07-26 MED ORDER — POTASSIUM CHLORIDE CRYS ER 20 MEQ PO TBCR: 20.0000 meq | EXTENDED_RELEASE_TABLET | Freq: Every day | ORAL | 1 refills | Status: DC

## 2016-08-11 ENCOUNTER — Ambulatory Visit: Payer: Medicare HMO | Admitting: Physician Assistant

## 2016-08-14 ENCOUNTER — Other Ambulatory Visit: Payer: Self-pay | Admitting: Internal Medicine

## 2016-08-16 NOTE — Telephone Encounter (Signed)
Please advise, medication not seen on med list

## 2016-08-18 ENCOUNTER — Ambulatory Visit (INDEPENDENT_AMBULATORY_CARE_PROVIDER_SITE_OTHER): Payer: Medicare HMO | Admitting: Physician Assistant

## 2016-08-18 ENCOUNTER — Ambulatory Visit: Payer: Medicare HMO | Admitting: Physician Assistant

## 2016-08-18 ENCOUNTER — Encounter: Payer: Self-pay | Admitting: Physician Assistant

## 2016-08-18 VITALS — BP 130/74 | HR 72 | Ht 63.0 in | Wt 245.5 lb

## 2016-08-18 DIAGNOSIS — R1031 Right lower quadrant pain: Secondary | ICD-10-CM

## 2016-08-18 DIAGNOSIS — R159 Full incontinence of feces: Secondary | ICD-10-CM | POA: Diagnosis not present

## 2016-08-18 DIAGNOSIS — Z9884 Bariatric surgery status: Secondary | ICD-10-CM | POA: Diagnosis not present

## 2016-08-18 DIAGNOSIS — R1032 Left lower quadrant pain: Secondary | ICD-10-CM

## 2016-08-18 NOTE — Progress Notes (Addendum)
Subjective:    Patient ID: [name], female    DOB: 1955/06/18, 61 y.o.   MRN: [id number]  HPI [name] is a pleasant 61 year old African-American female known to Dr. Carlean [name] who comes in today with complaints of couple of month history of bilateral lower abdominal pain and discomfort. She also describes. Frequent nausea especially postprandially. She is status post Roux-en-Y gastric bypass in 2004. She has backed off on eating recently and has been drinking protein shakes. She's not had any fever or chills. She says that she has had an increase in episodes of fecal incontinence as well which was described when she was here a year ago. She says very often she will have a bowel movement cleaning herself up and leave the bathroom and then have incontinence of a large amount of very soft stool. She says this is very distressing and embarrassing and she has been staying home a lot more because of concerns for incontinence. She had previously gone to pelvic floor physical therapy as she has trouble with urinary incontinence as well. She says she still does the exercises at home but does not seem to be helping the fecal incontinence which has gradually worsened. Patient had EGD in May 2017 per Dr. Carlean [name] with finding of a nonobstructing Schatzki's ring which was dilated she also was noted to have a prior anastomosis in the duodenal bulb which was patent. Patient had colonoscopy last she believes in 2016 with Dr. Paulita [name] and says that was a difficult perhaps incomplete exam   Other medical problems include hypertension, sleep apnea, peripheral neuropathy, fibromyalgia, and prior history of DVT.  Review of Systems Pertinent positive and negative review of systems were noted in the above HPI section.  All other review of systems was otherwise negative.  Outpatient Encounter Prescriptions as of 08/18/2016  Medication Sig  . ACCU-CHEK SOFTCLIX LANCETS lancets 1 each by Other route 2 (two) times daily.  Use to help check blood sugars twice a day Dx E11.9  . albuterol (PROVENTIL HFA;VENTOLIN HFA) 108 (90 Base) MCG/ACT inhaler Inhale 1-2 puffs into the lungs every 6 (six) hours as needed for wheezing or shortness of breath.  . Alcohol Swabs 70 % PADS Use to wipe area to check blood sugars daily Dx E11.9  . aspirin EC 81 MG tablet Take 81 mg by mouth daily.  Marland Kitchen BLACK COHOSH PO Take 1 tablet by mouth daily.  . Blood Glucose Calibration (ACCU-CHEK AVIVA) SOLN Use as directed  . Blood Glucose Monitoring Suppl (ACCU-CHEK AVIVA PLUS) w/Device KIT Use as directed to check blood sugars daily Dx E11.9  . CALCIUM PO Take 1 tablet by mouth daily. '600mg'$   . CRANBERRY PO Take 1 tablet by mouth daily.  Marland Kitchen escitalopram (LEXAPRO) 10 MG tablet Take 1 tablet (10 mg total) by mouth daily.  . ferrous sulfate 325 (65 FE) MG tablet Take 325 mg by mouth daily.   . furosemide (LASIX) 40 MG tablet TAKE 1 TABLET (40 MG TOTAL) BY MOUTH DAILY AS NEEDED FOR EDEMA.  Marland Kitchen gabapentin (NEURONTIN) 400 MG capsule TAKE 1 CAPSULE FOUR TIMES DAILY  . glucose blood (ACCU-CHEK AVIVA PLUS) test strip 1 each by Other route 2 (two) times daily. Use to check blood sugars twice a day Dx E11.9  . MELATONIN PO Take 1 tablet by mouth at bedtime as needed. For sleep  . metoCLOPramide (REGLAN) 5 MG tablet Take 1 tablet (5 mg total) by mouth every 6 (six) hours as needed for nausea.  . metoprolol  tartrate (LOPRESSOR) 25 MG tablet TAKE 1/2 TABLET (12.5 MG TOTAL) BY MOUTH 2 (TWO) TIMES DAILY.  . Multiple Vitamins-Minerals (MULTIVITAMINS THER. W/MINERALS) TABS Take 1 tablet by mouth daily.   . nitroGLYCERIN (NITROSTAT) 0.4 MG SL tablet Place 0.4 mg under the tongue every 5 (five) minutes as needed for chest pain.   . pantoprazole (PROTONIX) 40 MG tablet Take 1 tablet (40 mg total) by mouth daily before breakfast.  . polyethylene glycol (MIRALAX / GLYCOLAX) packet Take 17 g by mouth daily.  . potassium chloride SA (K-DUR,KLOR-CON) 20 MEQ tablet Take 1  tablet (20 mEq total) by mouth daily.  . promethazine (PHENERGAN) 25 MG tablet Take 25 mg by mouth every 6 (six) hours as needed for nausea.  . traMADol (ULTRAM) 50 MG tablet Take 1 tablet (50 mg total) by mouth 4 (four) times daily.  . vitamin C (ASCORBIC ACID) 500 MG tablet Take 500 mg by mouth daily.  Marland Kitchen VITAMIN D, CHOLECALCIFEROL, PO Take 1,200 Units by mouth daily.  . [DISCONTINUED] baclofen (LIORESAL) 10 MG tablet Take 1 tablet (10 mg total) by mouth 3 (three) times daily as needed for muscle spasms. (Patient not taking: Reported on 08/18/2016)  . [DISCONTINUED] fluticasone (FLONASE) 50 MCG/ACT nasal spray Place 2 sprays into both nostrils daily. (Patient not taking: Reported on 08/18/2016)   No facility-administered encounter medications on file as of 08/18/2016.    Allergies  Allergen Reactions  . Carafate [Sucralfate] Hives    Other reaction(s): Angioedema (ALLERGY/intolerance) hives scratching  . Sulfonamide Derivatives Rash    REACTION: Syncope   Patient Active Problem List   Diagnosis Date Noted  . Muscle cramps 07/09/2016  . Dysuria 05/26/2016  . Adjustment disorder with mixed anxiety and depressed mood 03/05/2016  . Weakness 03/05/2016  . Cervical disc disorder with radiculopathy of cervical region 02/27/2016  . Degenerative arthritis of right knee 02/27/2016  . Right knee pain 02/24/2016  . Left shoulder pain 02/24/2016  . Cough 02/24/2016  . Routine general medical examination at a health care facility 12/12/2015  . Schatzki's ring   . Diarrhea 06/01/2015  . Lumbar radiculopathy 03/05/2015  . Peripheral neuropathy 01/17/2015  . Fibromyalgia 02/21/2011  . Essential hypertension 12/04/2006  . Hx of diabetes mellitus 10/20/2006  . Morbid obesity ([place]) 10/20/2006  . MDD (major depressive disorder) ([place]) 10/20/2006  . OBSTRUCTIVE SLEEP APNEA 10/20/2006  . OSTEOARTHRITIS 10/20/2006  . DVT, HX OF 10/20/2006   Social History   Social History  . Marital status: Widowed      Spouse name: N/A  . Number of children: 0  . Years of education: college   Occupational History  . retired    Social History Main Topics  . Smoking status: Former Smoker    Packs/day: 0.50    Years: 10.00    Types: Cigarettes    Quit date: 04/19/2009  . Smokeless tobacco: Never Used  . Alcohol use 0.0 oz/week     Comment: occ. social- wine-1 drink monthly  . Drug use: No  . Sexual activity: Not Currently   Other Topics Concern  . Not on file   Social History Narrative  . No narrative on file    Ms. Fong's family history includes Diabetes in her mother; Heart disease in her father; Hypertension in her father; Kidney disease in her brother; Prostate cancer in her father.      Objective:    Vitals:   08/18/16 1331  BP: 130/74  Pulse: 72    Physical Exam  well-developed older African-American female in no acute distress, pleasant blood pressure 130/74 pulse 72, Height 5 foot 3, weight 245, BMI 43.4. HEENT ;nontraumatic normocephalic EOMI PERRLA sclera anicteric, Cardiovascular ;regular rate and rhythm with S1-S2 no murmur or gallop, Pulmonary; or bilaterally, Abdomen; obese soft midline incisional scar bowel sounds are active she is tender bilaterally in the lower quadrants there is no guarding or rebound no palpable mass or hepatosplenomegaly, Rectal ;exam not done, Extremities; no clubbing cyanosis or edema skin warm and dry, Neuropsych ;mood and affect appropriate       Assessment & Plan:   #77  61 year old African-American female with 2-3 month history of bilateral lower abdominal pain, fairly constant. Etiology not clear #2 fecal incontinence-progressive with increased frequency of episodes of complete incontinence #3 urinary incontinence #4 status post Roux-en-Y gastric bypass 2004 #5 frequent nausea #6 history of Schatzki's ring status post dilation 2017 no current complaints of dysphagia #6 hypertension #7 sleep apnea #8 peripheral neuropathy #9  fibromyalgia  Plan; Patient has signed a release and will obtain previous records from Dr. Clent Ridges colonoscopies Will schedule for CT of the abdomen and pelvis with contrast Give her a trial of Benefiber one dose daily in a glass of water to see if bulking up her stools will help with incontinence Continue reglan 5 mg every 6 hours when necessary for nausea which she had been on previously  Continue Protonix 40 mg by mouth every morning Think she will benefit from anorectal manometry and would be amenable to any surgical options which may help. Will review CT of the abdomen and pelvis first, and prior colonoscopies before scheduling Patient will follow-up with Dr. Carlean [name] in one month.   Addendum;  records received from Dr. St[facility]. EGD January 2014 showed altered duodenal anatomy status post gastric bypass one nonbleeding cratered duodenal ulcer in the duodenal bulb 8 mm in largest dimension there was some post bulbar edema but able to be traversed with the scope.  Upper GI small bowel November 2013 normal emptying of the stomach the second portion of the duodenum and blindly and there is an anastomosis with the loop of jejunum just below the duodenal bulb anastomosis is narrow suggesting there may be a stricture or inflammation.  Shiven Junious S Imojean Yoshino PA-C 08/18/2016   Cc: Hoyt Koch, *   Agree with Ms. Genia Harold assessment and plan. Gatha Mayer, MD, [facility]

## 2016-08-18 NOTE — Patient Instructions (Signed)
If you are age 63 or older, your body mass index should be between 23-30. Your Body mass index is 43.49 kg/m. If this is out of the aforementioned range listed, please consider follow up with your Primary Care Provider.  If you are age 64 or younger, your body mass index should be between 19-25. Your Body mass index is 43.49 kg/m. If this is out of the aformentioned range listed, please consider follow up with your Primary Care Provider.   You have been scheduled for a CT scan of the abdomen and pelvis at [facility] ([address]---this is in the same building as [facility]).   You are scheduled on 08/26/16 at 330 pm. You should arrive 15 minutes prior to your appointment time for registration. Please follow the written instructions below on the day of your exam:  WARNING: IF YOU ARE ALLERGIC TO IODINE/X-RAY DYE, PLEASE NOTIFY RADIOLOGY IMMEDIATELY AT [phone]! YOU WILL BE GIVEN A 13 HOUR PREMEDICATION PREP.  1) Do not eat anything after 1130 am (4 hours prior to your test) 2) You have been given 2 bottles of oral contrast to drink. The solution may taste               better if refrigerated, but do NOT add ice or any other liquid to this solution. Shake             well before drinking.    Drink 1 bottle of contrast @ 130 pm (2 hours prior to your exam)  Drink 1 bottle of contrast @ 230 pm(1 hour prior to your exam)  You may take any medications as prescribed with a small amount of water except for the following: Metformin, Glucophage, Glucovance, Avandamet, Riomet, Fortamet, Actoplus Met, Janumet, Glumetza or Metaglip. The above medications must be held the day of the exam AND 48 hours after the exam.  The purpose of you drinking the oral contrast is to aid in the visualization of your intestinal tract. The contrast solution may cause some diarrhea. Before your exam is started, you will be given a small amount of fluid to drink. Depending on your individual set of  symptoms, you may also receive an intravenous injection of x-ray contrast/dye. Plan on being at [facility] for 30 minutes or longer, depending on the type of exam you are having performed.  This test typically takes 30-45 minutes to complete.  If you have any questions regarding your exam or if you need to reschedule, you may call the CT department at [phone] between the hours of 8:00 am and 5:00 pm, Monday-Friday.   Start Benefiber one dose daily in a glass of water.  Continue Protonix 40 mg in the morning.  Follow up appointment with Dr. Carlean Purl on June 21,2018 at 2 pm.  Thank you for choosing me and [facility].  Amy Esterwood, PA-C ________________________________________________________________________

## 2016-08-21 ENCOUNTER — Other Ambulatory Visit: Payer: Self-pay | Admitting: Family Medicine

## 2016-08-23 NOTE — Telephone Encounter (Signed)
Rx request for Tramadol 50 mg  04/26/2016 No new OV  Cancelled appt for 06/10/2016 Last refilled 04/26/2016

## 2016-08-25 ENCOUNTER — Other Ambulatory Visit (INDEPENDENT_AMBULATORY_CARE_PROVIDER_SITE_OTHER): Payer: Medicare HMO

## 2016-08-25 DIAGNOSIS — R1031 Right lower quadrant pain: Secondary | ICD-10-CM

## 2016-08-25 DIAGNOSIS — R159 Full incontinence of feces: Secondary | ICD-10-CM | POA: Diagnosis not present

## 2016-08-25 DIAGNOSIS — R1032 Left lower quadrant pain: Secondary | ICD-10-CM

## 2016-08-25 DIAGNOSIS — Z9884 Bariatric surgery status: Secondary | ICD-10-CM | POA: Diagnosis not present

## 2016-08-25 LAB — CBC WITH DIFFERENTIAL/PLATELET
BASOS PCT: 0.7 % (ref 0.0–3.0)
Basophils Absolute: 0 10*3/uL (ref 0.0–0.1)
EOS ABS: 0.1 10*3/uL (ref 0.0–0.7)
EOS PCT: 1.9 % (ref 0.0–5.0)
HCT: 38 % (ref 36.0–46.0)
Hemoglobin: 12.1 g/dL (ref 12.0–15.0)
Lymphocytes Relative: 46.5 % — ABNORMAL HIGH (ref 12.0–46.0)
Lymphs Abs: 1.9 10*3/uL (ref 0.7–4.0)
MCHC: 31.9 g/dL (ref 30.0–36.0)
MCV: 86.4 fl (ref 78.0–100.0)
MONO ABS: 0.4 10*3/uL (ref 0.1–1.0)
Monocytes Relative: 10.1 % (ref 3.0–12.0)
NEUTROS ABS: 1.7 10*3/uL (ref 1.4–7.7)
Neutrophils Relative %: 40.8 % — ABNORMAL LOW (ref 43.0–77.0)
PLATELETS: 197 10*3/uL (ref 150.0–400.0)
RBC: 4.4 Mil/uL (ref 3.87–5.11)
RDW: 16.2 % — AB (ref 11.5–15.5)
WBC: 4.1 10*3/uL (ref 4.0–10.5)

## 2016-08-25 LAB — BASIC METABOLIC PANEL
BUN: 15 mg/dL (ref 6–23)
CALCIUM: 8.4 mg/dL (ref 8.4–10.5)
CO2: 23 mEq/L (ref 19–32)
Chloride: 113 mEq/L — ABNORMAL HIGH (ref 96–112)
Creatinine, Ser: 1.04 mg/dL (ref 0.40–1.20)
GFR: 68.86 mL/min (ref >60.00)
GLUCOSE: 100 mg/dL — AB (ref 70–99)
POTASSIUM: 3.8 meq/L (ref 3.5–5.1)
SODIUM: 141 meq/L (ref 135–145)

## 2016-08-26 ENCOUNTER — Ambulatory Visit (INDEPENDENT_AMBULATORY_CARE_PROVIDER_SITE_OTHER)
Admission: RE | Admit: 2016-08-26 | Discharge: 2016-08-26 | Disposition: A | Discharge status: Home / Self Care | Payer: Medicare HMO | Source: Ambulatory Visit | Attending: Physician Assistant | Admitting: Physician Assistant

## 2016-08-26 DIAGNOSIS — R1031 Right lower quadrant pain: Secondary | ICD-10-CM | POA: Diagnosis not present

## 2016-08-26 DIAGNOSIS — R1032 Left lower quadrant pain: Secondary | ICD-10-CM | POA: Diagnosis not present

## 2016-08-26 DIAGNOSIS — R109 Unspecified abdominal pain: Secondary | ICD-10-CM | POA: Diagnosis not present

## 2016-08-26 DIAGNOSIS — R159 Full incontinence of feces: Secondary | ICD-10-CM

## 2016-08-26 DIAGNOSIS — Z9884 Bariatric surgery status: Secondary | ICD-10-CM

## 2016-08-26 MED ORDER — IOPAMIDOL (ISOVUE-300) INJECTION 61%
100.0000 mL | Freq: Once | INTRAVENOUS | Status: AC | PRN
  Administered 2016-08-26: 100 mL via INTRAVENOUS

## 2016-08-30 ENCOUNTER — Ambulatory Visit (INDEPENDENT_AMBULATORY_CARE_PROVIDER_SITE_OTHER): Payer: Medicare HMO | Admitting: Internal Medicine

## 2016-08-30 ENCOUNTER — Encounter: Payer: Self-pay | Admitting: Internal Medicine

## 2016-08-30 ENCOUNTER — Other Ambulatory Visit: Payer: Self-pay | Admitting: Internal Medicine

## 2016-08-30 DIAGNOSIS — R05 Cough: Secondary | ICD-10-CM

## 2016-08-30 DIAGNOSIS — R059 Cough, unspecified: Secondary | ICD-10-CM

## 2016-08-30 MED ORDER — TRAMADOL HCL 50 MG PO TABS: 50.0000 mg | ORAL_TABLET | Freq: Four times a day (QID) | ORAL | 2 refills | Status: DC

## 2016-08-30 MED ORDER — BUPROPION HCL ER (SR) 150 MG PO TB12
150.0000 mg | ORAL_TABLET | Freq: Two times a day (BID) | ORAL | 6 refills | Status: DC
Start: 2016-08-30 — End: 2017-10-18

## 2016-08-30 MED ORDER — HYDROCODONE-HOMATROPINE 5-1.5 MG/5ML PO SYRP: 5.0000 mL | ORAL_SOLUTION | Freq: Three times a day (TID) | ORAL | 0 refills | Status: DC | PRN

## 2016-08-30 MED ORDER — DOXYCYCLINE MONOHYDRATE 100 MG PO CAPS: 100.0000 mg | ORAL_CAPSULE | Freq: Two times a day (BID) | ORAL | 0 refills | Status: DC

## 2016-08-30 MED ORDER — PREDNISONE 20 MG PO TABS: 40.0000 mg | ORAL_TABLET | Freq: Every day | ORAL | 0 refills | Status: DC

## 2016-08-30 NOTE — Progress Notes (Signed)
   Subjective:    Patient ID: [name], female    DOB: 02-14-1956, 61 y.o.   MRN: [id number]  HPI The patient is a 61 YO female coming in for about 1-2 weeks of cough, SOB, wheezing. She is having some mild allergy symptoms as well. Mild nose drainage but not as severe as usual for her this time of year. Denies fevers or chills. She denies ear pain or discharge. She denies headaches or migraines. Has been using albuterol inhaler more recently and this helps some with the SOB. She is taking allergy medicine over the counter without missing which is zyrtec.   Review of Systems  Constitutional: Positive for activity change and fatigue. Negative for appetite change, chills, fever and unexpected weight change.  HENT: Positive for congestion and rhinorrhea. Negative for dental problem, drooling, ear discharge, ear pain, nosebleeds, postnasal drip, sinus pain, sinus pressure, sore throat, trouble swallowing and voice change.   Eyes: Negative.   Respiratory: Positive for cough, shortness of breath and wheezing. Negative for apnea and chest tightness.   Cardiovascular: Negative.   Gastrointestinal: Negative.   Musculoskeletal: Negative.   Neurological: Negative.       Objective:   Physical Exam  Constitutional: She is oriented to person, place, and time. She appears well-developed and well-nourished.  HENT:  Head: Normocephalic and atraumatic.  Right Ear: External ear normal.  Left Ear: External ear normal.  Oropharynx with redness, no drainage, nose without crusting.   Neck: Normal range of motion. No JVD present.  Cardiovascular: Normal rate and regular rhythm.   Pulmonary/Chest: Effort normal. No respiratory distress. She has wheezes. She has no rales. She exhibits no tenderness.  Scattered wheezing with rhonchi which partially clears with cough  Abdominal: Soft.  Lymphadenopathy:    She has no cervical adenopathy.  Neurological: She is alert and oriented to person, place, and  time.  Skin: Skin is warm and dry.   Vitals:   08/30/16 1533  BP: 138/90  Pulse: 97  Resp: 16  SpO2: 99%  Weight: 238 lb (108 kg)  Height: 5\' 3"  (1.6 m)      Assessment & Plan:

## 2016-08-30 NOTE — Patient Instructions (Signed)
We have given you a cough medicine that you use at night time.  We have sent in prednisone to use. Take 2 pills daily for 5 days.   We have sent in the antibiotic called doxycycline. Take 1 pill twice a day for 1 week.

## 2016-08-31 NOTE — Assessment & Plan Note (Signed)
Suspect bronchitis and rx for doxycycline and prednisone and hycodan for resolution with the wheezing and changes on lung exam. Can use albuterol inhaler if needed as well until symptoms resolve.

## 2016-10-07 ENCOUNTER — Ambulatory Visit: Payer: Medicare HMO | Admitting: Internal Medicine

## 2016-10-11 ENCOUNTER — Encounter: Payer: Self-pay | Admitting: Physical Therapy

## 2016-10-11 NOTE — Therapy (Signed)
[facility] [address] [place], [place], [address] Phone: [phone]   Fax:  [phone]  Patient Details  Name: [name] MRN: [id number] Date of Birth: January 21, 1956 Referring Provider:  Hulan Saas, DO  Encounter Date: 10/11/2016  PHYSICAL THERAPY DISCHARGE SUMMARY  Visits from Start of Care: 5  Current functional level related to goals / functional outcomes: Patient had death in her family & other medical issues. She canceled multiple appointments then called to discharge PT.    Remaining deficits: Unknown as patient not seen for discharge.    Education / Equipment: HEP  Plan: Patient agrees to discharge.  Patient goals were not met. Patient is being discharged due to the patient's request.  ?????        Hendrix Yurkovich PT, DPT 10/11/2016, 12:34 PM  [facility] [address] [place], [place], [address] Phone: [phone]   Fax:  [phone]

## 2016-10-19 DIAGNOSIS — M7672 Peroneal tendinitis, left leg: Secondary | ICD-10-CM | POA: Diagnosis not present

## 2016-10-19 DIAGNOSIS — M71572 Other bursitis, not elsewhere classified, left ankle and foot: Secondary | ICD-10-CM | POA: Diagnosis not present

## 2016-10-19 DIAGNOSIS — M71571 Other bursitis, not elsewhere classified, right ankle and foot: Secondary | ICD-10-CM | POA: Diagnosis not present

## 2016-10-19 DIAGNOSIS — M7671 Peroneal tendinitis, right leg: Secondary | ICD-10-CM | POA: Diagnosis not present

## 2016-10-19 DIAGNOSIS — Q6622 Congenital metatarsus adductus: Secondary | ICD-10-CM | POA: Diagnosis not present

## 2016-10-26 DIAGNOSIS — M7751 Other enthesopathy of right foot: Secondary | ICD-10-CM | POA: Diagnosis not present

## 2016-10-26 DIAGNOSIS — M71571 Other bursitis, not elsewhere classified, right ankle and foot: Secondary | ICD-10-CM | POA: Diagnosis not present

## 2016-10-26 DIAGNOSIS — M71572 Other bursitis, not elsewhere classified, left ankle and foot: Secondary | ICD-10-CM | POA: Diagnosis not present

## 2016-10-26 DIAGNOSIS — M7752 Other enthesopathy of left foot: Secondary | ICD-10-CM | POA: Diagnosis not present

## 2016-10-26 DIAGNOSIS — G5790 Unspecified mononeuropathy of unspecified lower limb: Secondary | ICD-10-CM | POA: Diagnosis not present

## 2016-11-30 ENCOUNTER — Ambulatory Visit: Payer: Medicare HMO | Admitting: Internal Medicine

## 2016-12-10 DIAGNOSIS — M545 Low back pain: Secondary | ICD-10-CM | POA: Diagnosis not present

## 2016-12-10 DIAGNOSIS — R109 Unspecified abdominal pain: Secondary | ICD-10-CM | POA: Diagnosis not present

## 2016-12-10 DIAGNOSIS — R55 Syncope and collapse: Secondary | ICD-10-CM | POA: Diagnosis not present

## 2016-12-10 DIAGNOSIS — M546 Pain in thoracic spine: Secondary | ICD-10-CM | POA: Diagnosis not present

## 2016-12-10 DIAGNOSIS — M542 Cervicalgia: Secondary | ICD-10-CM | POA: Diagnosis not present

## 2016-12-10 DIAGNOSIS — R51 Headache: Secondary | ICD-10-CM | POA: Diagnosis not present

## 2016-12-10 DIAGNOSIS — W010XXA Fall on same level from slipping, tripping and stumbling without subsequent striking against object, initial encounter: Secondary | ICD-10-CM | POA: Diagnosis not present

## 2016-12-10 DIAGNOSIS — S1989XA Other specified injuries of other specified part of neck, initial encounter: Secondary | ICD-10-CM | POA: Diagnosis not present

## 2016-12-10 DIAGNOSIS — S3983XA Other specified injuries of pelvis, initial encounter: Secondary | ICD-10-CM | POA: Diagnosis not present

## 2016-12-10 DIAGNOSIS — Z743 Need for continuous supervision: Secondary | ICD-10-CM | POA: Diagnosis not present

## 2016-12-10 DIAGNOSIS — S4991XA Unspecified injury of right shoulder and upper arm, initial encounter: Secondary | ICD-10-CM | POA: Diagnosis not present

## 2016-12-10 DIAGNOSIS — I10 Essential (primary) hypertension: Secondary | ICD-10-CM | POA: Diagnosis not present

## 2016-12-10 DIAGNOSIS — S298XXA Other specified injuries of thorax, initial encounter: Secondary | ICD-10-CM | POA: Diagnosis not present

## 2016-12-10 DIAGNOSIS — R531 Weakness: Secondary | ICD-10-CM | POA: Diagnosis not present

## 2016-12-10 DIAGNOSIS — S098XXA Other specified injuries of head, initial encounter: Secondary | ICD-10-CM | POA: Diagnosis not present

## 2016-12-10 DIAGNOSIS — S3981XA Other specified injuries of abdomen, initial encounter: Secondary | ICD-10-CM | POA: Diagnosis not present

## 2016-12-10 DIAGNOSIS — S3982XA Other specified injuries of lower back, initial encounter: Secondary | ICD-10-CM | POA: Diagnosis not present

## 2016-12-10 DIAGNOSIS — M5416 Radiculopathy, lumbar region: Secondary | ICD-10-CM | POA: Diagnosis not present

## 2016-12-11 DIAGNOSIS — M542 Cervicalgia: Secondary | ICD-10-CM | POA: Diagnosis not present

## 2016-12-11 DIAGNOSIS — R7989 Other specified abnormal findings of blood chemistry: Secondary | ICD-10-CM | POA: Diagnosis not present

## 2016-12-11 DIAGNOSIS — S1989XA Other specified injuries of other specified part of neck, initial encounter: Secondary | ICD-10-CM | POA: Diagnosis not present

## 2016-12-11 DIAGNOSIS — M47814 Spondylosis without myelopathy or radiculopathy, thoracic region: Secondary | ICD-10-CM | POA: Diagnosis not present

## 2016-12-11 DIAGNOSIS — S298XXA Other specified injuries of thorax, initial encounter: Secondary | ICD-10-CM | POA: Diagnosis not present

## 2016-12-11 DIAGNOSIS — S3982XA Other specified injuries of lower back, initial encounter: Secondary | ICD-10-CM | POA: Diagnosis not present

## 2016-12-11 DIAGNOSIS — M47816 Spondylosis without myelopathy or radiculopathy, lumbar region: Secondary | ICD-10-CM | POA: Diagnosis not present

## 2016-12-11 DIAGNOSIS — M549 Dorsalgia, unspecified: Secondary | ICD-10-CM | POA: Diagnosis not present

## 2016-12-11 DIAGNOSIS — M545 Low back pain: Secondary | ICD-10-CM | POA: Diagnosis not present

## 2016-12-11 DIAGNOSIS — M546 Pain in thoracic spine: Secondary | ICD-10-CM | POA: Diagnosis not present

## 2016-12-11 DIAGNOSIS — R112 Nausea with vomiting, unspecified: Secondary | ICD-10-CM | POA: Diagnosis not present

## 2016-12-11 DIAGNOSIS — R197 Diarrhea, unspecified: Secondary | ICD-10-CM | POA: Diagnosis not present

## 2016-12-11 DIAGNOSIS — M4303 Spondylolysis, cervicothoracic region: Secondary | ICD-10-CM | POA: Diagnosis not present

## 2016-12-11 DIAGNOSIS — R52 Pain, unspecified: Secondary | ICD-10-CM | POA: Diagnosis not present

## 2016-12-11 DIAGNOSIS — M4306 Spondylolysis, lumbar region: Secondary | ICD-10-CM | POA: Diagnosis not present

## 2016-12-11 DIAGNOSIS — M47812 Spondylosis without myelopathy or radiculopathy, cervical region: Secondary | ICD-10-CM | POA: Diagnosis not present

## 2016-12-11 DIAGNOSIS — M4802 Spinal stenosis, cervical region: Secondary | ICD-10-CM | POA: Diagnosis not present

## 2016-12-11 DIAGNOSIS — M25511 Pain in right shoulder: Secondary | ICD-10-CM | POA: Diagnosis not present

## 2016-12-11 DIAGNOSIS — I10 Essential (primary) hypertension: Secondary | ICD-10-CM | POA: Diagnosis not present

## 2016-12-11 DIAGNOSIS — M4800 Spinal stenosis, site unspecified: Secondary | ICD-10-CM | POA: Diagnosis not present

## 2016-12-11 DIAGNOSIS — Z743 Need for continuous supervision: Secondary | ICD-10-CM | POA: Diagnosis not present

## 2016-12-11 DIAGNOSIS — W19XXXA Unspecified fall, initial encounter: Secondary | ICD-10-CM | POA: Diagnosis not present

## 2016-12-11 DIAGNOSIS — I498 Other specified cardiac arrhythmias: Secondary | ICD-10-CM | POA: Diagnosis not present

## 2016-12-11 DIAGNOSIS — M4804 Spinal stenosis, thoracic region: Secondary | ICD-10-CM | POA: Diagnosis not present

## 2016-12-12 DIAGNOSIS — M47816 Spondylosis without myelopathy or radiculopathy, lumbar region: Secondary | ICD-10-CM | POA: Diagnosis not present

## 2016-12-12 DIAGNOSIS — M47812 Spondylosis without myelopathy or radiculopathy, cervical region: Secondary | ICD-10-CM | POA: Diagnosis not present

## 2016-12-12 DIAGNOSIS — M4804 Spinal stenosis, thoracic region: Secondary | ICD-10-CM | POA: Diagnosis not present

## 2016-12-12 DIAGNOSIS — M25511 Pain in right shoulder: Secondary | ICD-10-CM | POA: Diagnosis not present

## 2016-12-12 DIAGNOSIS — Y929 Unspecified place or not applicable: Secondary | ICD-10-CM | POA: Diagnosis not present

## 2016-12-12 DIAGNOSIS — R112 Nausea with vomiting, unspecified: Secondary | ICD-10-CM | POA: Diagnosis not present

## 2016-12-12 DIAGNOSIS — M4802 Spinal stenosis, cervical region: Secondary | ICD-10-CM | POA: Diagnosis not present

## 2016-12-12 DIAGNOSIS — M4303 Spondylolysis, cervicothoracic region: Secondary | ICD-10-CM | POA: Diagnosis not present

## 2016-12-12 DIAGNOSIS — W1800XA Striking against unspecified object with subsequent fall, initial encounter: Secondary | ICD-10-CM | POA: Diagnosis not present

## 2016-12-12 DIAGNOSIS — M47814 Spondylosis without myelopathy or radiculopathy, thoracic region: Secondary | ICD-10-CM | POA: Diagnosis not present

## 2016-12-12 DIAGNOSIS — R52 Pain, unspecified: Secondary | ICD-10-CM | POA: Diagnosis not present

## 2016-12-12 DIAGNOSIS — I10 Essential (primary) hypertension: Secondary | ICD-10-CM | POA: Diagnosis not present

## 2016-12-12 DIAGNOSIS — W19XXXA Unspecified fall, initial encounter: Secondary | ICD-10-CM | POA: Diagnosis not present

## 2016-12-12 DIAGNOSIS — M4306 Spondylolysis, lumbar region: Secondary | ICD-10-CM | POA: Diagnosis not present

## 2016-12-12 DIAGNOSIS — R197 Diarrhea, unspecified: Secondary | ICD-10-CM | POA: Diagnosis not present

## 2016-12-12 DIAGNOSIS — M549 Dorsalgia, unspecified: Secondary | ICD-10-CM | POA: Diagnosis not present

## 2016-12-12 DIAGNOSIS — R7989 Other specified abnormal findings of blood chemistry: Secondary | ICD-10-CM | POA: Diagnosis not present

## 2016-12-12 DIAGNOSIS — M4800 Spinal stenosis, site unspecified: Secondary | ICD-10-CM | POA: Diagnosis not present

## 2016-12-12 DIAGNOSIS — R531 Weakness: Secondary | ICD-10-CM | POA: Diagnosis not present

## 2016-12-12 DIAGNOSIS — M545 Low back pain: Secondary | ICD-10-CM | POA: Diagnosis not present

## 2016-12-12 LAB — BASIC METABOLIC PANEL
BUN: 15 (ref 4–21)
Creatinine: 1 (ref 0.5–1.1)
Glucose: 181
POTASSIUM: 4.6 (ref 3.4–5.3)
SODIUM: 140 (ref 137–147)

## 2016-12-12 LAB — HEPATIC FUNCTION PANEL: Bilirubin, Total: 0.3

## 2016-12-13 DIAGNOSIS — R197 Diarrhea, unspecified: Secondary | ICD-10-CM | POA: Diagnosis not present

## 2016-12-13 DIAGNOSIS — M47812 Spondylosis without myelopathy or radiculopathy, cervical region: Secondary | ICD-10-CM | POA: Diagnosis not present

## 2016-12-13 DIAGNOSIS — R109 Unspecified abdominal pain: Secondary | ICD-10-CM | POA: Diagnosis not present

## 2016-12-13 DIAGNOSIS — M4802 Spinal stenosis, cervical region: Secondary | ICD-10-CM | POA: Diagnosis not present

## 2016-12-13 DIAGNOSIS — M47816 Spondylosis without myelopathy or radiculopathy, lumbar region: Secondary | ICD-10-CM | POA: Diagnosis not present

## 2016-12-13 DIAGNOSIS — M47814 Spondylosis without myelopathy or radiculopathy, thoracic region: Secondary | ICD-10-CM | POA: Diagnosis not present

## 2016-12-13 DIAGNOSIS — M4804 Spinal stenosis, thoracic region: Secondary | ICD-10-CM | POA: Diagnosis not present

## 2016-12-13 DIAGNOSIS — M549 Dorsalgia, unspecified: Secondary | ICD-10-CM | POA: Diagnosis not present

## 2016-12-13 DIAGNOSIS — R112 Nausea with vomiting, unspecified: Secondary | ICD-10-CM | POA: Diagnosis not present

## 2016-12-13 DIAGNOSIS — M25511 Pain in right shoulder: Secondary | ICD-10-CM | POA: Diagnosis not present

## 2016-12-14 DIAGNOSIS — R7989 Other specified abnormal findings of blood chemistry: Secondary | ICD-10-CM | POA: Diagnosis not present

## 2016-12-14 DIAGNOSIS — M4303 Spondylolysis, cervicothoracic region: Secondary | ICD-10-CM | POA: Diagnosis not present

## 2016-12-14 DIAGNOSIS — M4802 Spinal stenosis, cervical region: Secondary | ICD-10-CM | POA: Diagnosis not present

## 2016-12-14 DIAGNOSIS — M4306 Spondylolysis, lumbar region: Secondary | ICD-10-CM | POA: Diagnosis not present

## 2016-12-14 DIAGNOSIS — M4804 Spinal stenosis, thoracic region: Secondary | ICD-10-CM | POA: Diagnosis not present

## 2016-12-14 DIAGNOSIS — M25511 Pain in right shoulder: Secondary | ICD-10-CM | POA: Diagnosis not present

## 2016-12-14 DIAGNOSIS — R52 Pain, unspecified: Secondary | ICD-10-CM | POA: Diagnosis not present

## 2016-12-14 DIAGNOSIS — M549 Dorsalgia, unspecified: Secondary | ICD-10-CM | POA: Diagnosis not present

## 2016-12-14 DIAGNOSIS — M47816 Spondylosis without myelopathy or radiculopathy, lumbar region: Secondary | ICD-10-CM | POA: Diagnosis not present

## 2016-12-14 DIAGNOSIS — M4800 Spinal stenosis, site unspecified: Secondary | ICD-10-CM | POA: Diagnosis not present

## 2016-12-14 DIAGNOSIS — I10 Essential (primary) hypertension: Secondary | ICD-10-CM | POA: Diagnosis not present

## 2016-12-14 DIAGNOSIS — R197 Diarrhea, unspecified: Secondary | ICD-10-CM | POA: Diagnosis not present

## 2016-12-14 DIAGNOSIS — M47814 Spondylosis without myelopathy or radiculopathy, thoracic region: Secondary | ICD-10-CM | POA: Diagnosis not present

## 2016-12-14 DIAGNOSIS — W19XXXA Unspecified fall, initial encounter: Secondary | ICD-10-CM | POA: Diagnosis not present

## 2016-12-14 DIAGNOSIS — M47812 Spondylosis without myelopathy or radiculopathy, cervical region: Secondary | ICD-10-CM | POA: Diagnosis not present

## 2016-12-14 DIAGNOSIS — R112 Nausea with vomiting, unspecified: Secondary | ICD-10-CM | POA: Diagnosis not present

## 2016-12-14 LAB — CBC AND DIFFERENTIAL
HCT: 41 (ref 36–46)
HEMOGLOBIN: 13.6 (ref 12.0–16.0)
NEUTROS ABS: 6
PLATELETS: 190 (ref 150–399)
WBC: 5

## 2016-12-14 LAB — HEPATIC FUNCTION PANEL
Alkaline Phosphatase: 180 — AB (ref 25–125)
BILIRUBIN, TOTAL: 0.5

## 2016-12-17 ENCOUNTER — Ambulatory Visit (INDEPENDENT_AMBULATORY_CARE_PROVIDER_SITE_OTHER): Payer: Medicare HMO | Admitting: Family Medicine

## 2016-12-17 ENCOUNTER — Other Ambulatory Visit (INDEPENDENT_AMBULATORY_CARE_PROVIDER_SITE_OTHER): Payer: Medicare HMO

## 2016-12-17 ENCOUNTER — Encounter: Payer: Self-pay | Admitting: Family Medicine

## 2016-12-17 VITALS — BP 90/74 | HR 87 | Temp 98.8°F | Ht 63.0 in | Wt 231.0 lb

## 2016-12-17 DIAGNOSIS — I10 Essential (primary) hypertension: Secondary | ICD-10-CM

## 2016-12-17 DIAGNOSIS — M5416 Radiculopathy, lumbar region: Secondary | ICD-10-CM

## 2016-12-17 DIAGNOSIS — R112 Nausea with vomiting, unspecified: Secondary | ICD-10-CM

## 2016-12-17 LAB — CBC WITH DIFFERENTIAL/PLATELET
Basophils Absolute: 0.1 10*3/uL (ref 0.0–0.1)
Basophils Relative: 0.7 % (ref 0.0–3.0)
EOS ABS: 0.1 10*3/uL (ref 0.0–0.7)
Eosinophils Relative: 1.5 % (ref 0.0–5.0)
HCT: 44.9 % (ref 36.0–46.0)
Hemoglobin: 14.5 g/dL (ref 12.0–15.0)
LYMPHS ABS: 2 10*3/uL (ref 0.7–4.0)
Lymphocytes Relative: 24.3 % (ref 12.0–46.0)
MCHC: 32.3 g/dL (ref 30.0–36.0)
MCV: 82.8 fl (ref 78.0–100.0)
MONO ABS: 0.7 10*3/uL (ref 0.1–1.0)
MONOS PCT: 8 % (ref 3.0–12.0)
NEUTROS ABS: 5.5 10*3/uL (ref 1.4–7.7)
NEUTROS PCT: 65.5 % (ref 43.0–77.0)
Platelets: 210 10*3/uL (ref 150.0–400.0)
RBC: 5.42 Mil/uL — AB (ref 3.87–5.11)
RDW: 16.2 % — AB (ref 11.5–15.5)
WBC: 8.4 10*3/uL (ref 4.0–10.5)

## 2016-12-17 LAB — COMPREHENSIVE METABOLIC PANEL
ALK PHOS: 151 U/L — AB (ref 39–117)
ALT: 80 U/L — AB (ref 0–35)
AST: 34 U/L (ref 0–37)
Albumin: 3.6 g/dL (ref 3.5–5.2)
BILIRUBIN TOTAL: 0.5 mg/dL (ref 0.2–1.2)
BUN: 26 mg/dL — AB (ref 6–23)
CO2: 22 mEq/L (ref 19–32)
CREATININE: 2.62 mg/dL — AB (ref 0.40–1.20)
Calcium: 8.9 mg/dL (ref 8.4–10.5)
Chloride: 100 mEq/L (ref 96–112)
GFR: 23.68 mL/min — AB (ref >60.00)
GLUCOSE: 125 mg/dL — AB (ref 70–99)
Potassium: 3.2 mEq/L — ABNORMAL LOW (ref 3.5–5.1)
SODIUM: 133 meq/L — AB (ref 135–145)
TOTAL PROTEIN: 6.3 g/dL (ref 6.0–8.3)

## 2016-12-17 LAB — LIPASE: Lipase: 29 U/L (ref 11.0–59.0)

## 2016-12-17 LAB — AMYLASE: AMYLASE: 53 U/L (ref 27–131)

## 2016-12-17 MED ORDER — ONDANSETRON HCL 4 MG PO TABS: 4.0000 mg | ORAL_TABLET | Freq: Three times a day (TID) | ORAL | 1 refills | Status: DC | PRN

## 2016-12-17 NOTE — Patient Instructions (Addendum)
Thank you for coming in,   Please obtain your records from your hospital visit [place]. Please send them to [place]. We need to see the x-rays and the MRI. Please let us of your symptoms do not improve. These symptoms should slowly get better over the course of the next week.  Please hold your blood pressure medication until you are not having any nausea or vomiting.    Please feel free to call with any questions or concerns at any time, at [phone]. --Dr. Raeford Razor

## 2016-12-17 NOTE — Progress Notes (Signed)
[name] - 61 y.o. female MRN [id number]  Date of birth: 02-02-1956  SUBJECTIVE:  Including CC & ROS.  Chief Complaint  Patient presents with  . Hospitalization Follow-up    patient states that she has really bad nausea and is not able to eat     [name] is a 61 yo F that is presenting with nausea. She was admitted at a hospital in [place] from 8/24-8/28. She fell and was admitted for lumbar spondylosis with myelopathy. I have reviewed discharge instructions but she does not have any imaging or notes to review. She was provided narcotics while admitted and develop significant nausea and vomiting. Lab work showed, her report, that she had elevated liver enzymes. She was able to return to [place] on 8/29. She is still feeling weak and having nausea. She is unable to walk long distances without pain. She is still having left sided pain after her fall. She denies any numbness. Has nausea with eating any solid food. She is taking tramadol for her pain.   Has high blood pressure and only takes lisinopril. Denies any fever.      Review of Systems  Constitutional: Negative for fever.  Respiratory: Negative for shortness of breath.   Cardiovascular: Negative for chest pain.  Gastrointestinal: Positive for nausea and vomiting.  Musculoskeletal: Positive for back pain and gait problem.  Skin: Positive for color change.  Neurological: Positive for weakness. Negative for numbness.  Hematological: Negative for adenopathy.  Psychiatric/Behavioral: Negative for agitation.  otherwise negative   HISTORY: Past Medical, Surgical, Social, and Family History Reviewed & Updated per EMR.   Pertinent Historical Findings include:  Past Medical History:  Diagnosis Date  . Anemia    takes Ferrous Sulfate daily  . Anxiety    takes Citalopram daily  . Arthritis   . Asthma    2004-prior to gastric bypass and no problems since  . CHF (congestive heart failure) (HCC)    takes Lasix daily as  needed  . Chronic back pain    spondylolisthesis/stenosis/radiculopathy  . Complication of anesthesia yrs ago   slow to wake up  . Depression   . Diabetes ([place])   . DVT (deep venous thrombosis) ([place]) 01-17-13   past hx. -tx.5-6 yrs ago bilateral legs, occ. sporadic swelling, has IVC filter implanted  . Dysrhythmia    "heart tends to flutter"  . Fibromyalgia   . Fracture    right foot and is in a cam boot  . Gallstones   . GERD (gastroesophageal reflux disease)    hx of-no meds now  . Heart murmur   . History of bronchitis 2012 or 2013  . History of colon polyps   . Hypertension    takes Metoprolol daily  . Insomnia    takes Melatonin daily  . Joint pain   . Joint swelling   . Pelvic floor dysfunction   . Peripheral neuropathy   . Pneumonia 90's   hx of  . S/P gastric bypass   . Sleep apnea    no cpap used in many yrs after weight lost-no machine now  . Urinary frequency   . Urinary urgency   . Vitamin D deficiency     Past Surgical History:  Procedure Laterality Date  . BALLOON DILATION N/A 01/31/2013   Procedure: BALLOON DILATION;  Surgeon: Arta Silence, MD;  Location: [facility];  Service: Endoscopy;  Laterality: N/A;  . CHOLECYSTECTOMY  2007  . COLONOSCOPY    . DILATION AND CURETTAGE  OF UTERUS  yrs ago  . ESOPHAGOGASTRODUODENOSCOPY (EGD) WITH PROPOFOL  03/01/2012   Procedure: ESOPHAGOGASTRODUODENOSCOPY (EGD) WITH PROPOFOL;  Surgeon: Arta Silence, MD;  [facility];  Service: Endoscopy;  Laterality: N/A;  . ESOPHAGOGASTRODUODENOSCOPY (EGD) WITH PROPOFOL N/A 01/31/2013   Procedure: ESOPHAGOGASTRODUODENOSCOPY (EGD) WITH PROPOFOL;  Surgeon: Arta Silence, MD;  [facility];  Service: Endoscopy;  Laterality: N/A;  . ESOPHAGOGASTRODUODENOSCOPY (EGD) WITH PROPOFOL N/A 09/02/2015   Procedure: ESOPHAGOGASTRODUODENOSCOPY (EGD) WITH PROPOFOL;  Surgeon: Gatha Mayer, MD;  [facility];  Service: Endoscopy;  Laterality: N/A;  . GASTRIC  BY-PASS  2004  . HERNIA REPAIR  2005  . INSERTION OF VENA CAVA FILTER  01-17-13   inserted 2004- "abdomen"  . LIGAMENT REPAIR Right 1987   Rt. knee scope  . MAXIMUM ACCESS (MAS)POSTERIOR LUMBAR INTERBODY FUSION (PLIF) 2 LEVEL N/A 06/07/2014   Procedure: L4-5 L5-S1 FOR MAXIMUM ACCESS (MAS) POSTERIOR LUMBAR INTERBODY FUSION ;  Surgeon: Erline Levine, MD;  Location: [facility];  Service: Neurosurgery;  Laterality: N/A;  L4-5 L5-S1 FOR MAXIMUM ACCESS (MAS) POSTERIOR LUMBAR INTERBODY FUSION   . TONSILLECTOMY     as child    Allergies  Allergen Reactions  . Carafate [Sucralfate] Hives    Other reaction(s): Angioedema (ALLERGY/intolerance) hives scratching  . Sulfonamide Derivatives Rash    REACTION: Syncope    Family History  Problem Relation Age of Onset  . Diabetes Mother   . Heart disease Father   . Hypertension Father   . Prostate cancer Father   . Kidney disease Brother   . Colon cancer Neg Hx   . Colon polyps Neg Hx   . Stomach cancer Neg Hx   . Esophageal cancer Neg Hx   . Pancreatic cancer Neg Hx   . Liver disease Neg Hx      Social History   Social History  . Marital status: Widowed    Spouse name: N/A  . Number of children: 0  . Years of education: college   Occupational History  . retired    Social History Main Topics  . Smoking status: Former Smoker    Packs/day: 0.50    Years: 10.00    Types: Cigarettes    Quit date: 04/19/2009  . Smokeless tobacco: Never Used  . Alcohol use 0.0 oz/week     Comment: occ. social- wine-1 drink monthly  . Drug use: No  . Sexual activity: Not Currently   Other Topics Concern  . Not on file   Social History Narrative  . No narrative on file     PHYSICAL EXAM:  VS: BP 90/74 (BP Location: Left Arm, Patient Position: Sitting, Cuff Size: Normal)   Pulse 87   Temp 98.8 F (37.1 C) (Oral)   Ht 5\' 3"  (1.6 m)   Wt 231 lb (104.8 kg)   SpO2 99%   BMI 40.92 kg/m  Physical Exam Gen: NAD, alert, cooperative with exam,  well-appearing ENT: normal lips, normal nasal mucosa,  Eye: normal EOM, normal conjunctiva and lids CV:  no edema, +2 pedal pulses, S1S2,   Resp: no accessory muscle use, non-labored,   Skin: no rashes, no areas of induration  Neuro: normal tone, normal sensation to touch Psych:  normal insight, alert and oriented MSK: able to stand for limited amount of time.  Normal neck ROM  Normal shoulder shrug  Normal shoulder ROM and strength  Normal hip flexion strength  Pain with SLR on left  Sensation intact in LE  Neurovascularly intact.  ASSESSMENT & PLAN:   Non-intractable vomiting with nausea Unclear if her nausea is associated with her pain or illness. Reports she had elevated liver enzymes but doesn't have any data to review from her admission  - CMP, CBC, lipase and amylase - zofran - advised to obtain hospital admission records.   Essential hypertension Blood pressure was lower today than what it usually is.  - advised to hold her lisinopril until her nausea and vomiting has stopped   Lumbar radiculopathy Acute on chronic. Has a history of surgery of her back. She fell hard and was admitted for this. Doesn't appear to have any neurological compromise. I don't have any MRI or xray to review  - continue tramadol  - advised to obtain her imaging from her admission  - she has follow up scheduled with her neurosurgeon.

## 2016-12-18 DIAGNOSIS — R0600 Dyspnea, unspecified: Secondary | ICD-10-CM | POA: Diagnosis not present

## 2016-12-18 DIAGNOSIS — Z833 Family history of diabetes mellitus: Secondary | ICD-10-CM

## 2016-12-18 DIAGNOSIS — Z7982 Long term (current) use of aspirin: Secondary | ICD-10-CM

## 2016-12-18 DIAGNOSIS — D649 Anemia, unspecified: Secondary | ICD-10-CM | POA: Diagnosis present

## 2016-12-18 DIAGNOSIS — F419 Anxiety disorder, unspecified: Secondary | ICD-10-CM | POA: Diagnosis present

## 2016-12-18 DIAGNOSIS — R42 Dizziness and giddiness: Secondary | ICD-10-CM | POA: Diagnosis present

## 2016-12-18 DIAGNOSIS — M1990 Unspecified osteoarthritis, unspecified site: Secondary | ICD-10-CM | POA: Diagnosis present

## 2016-12-18 DIAGNOSIS — I9589 Other hypotension: Secondary | ICD-10-CM | POA: Diagnosis not present

## 2016-12-18 DIAGNOSIS — Z6838 Body mass index (BMI) 38.0-38.9, adult: Secondary | ICD-10-CM

## 2016-12-18 DIAGNOSIS — N179 Acute kidney failure, unspecified: Principal | ICD-10-CM | POA: Diagnosis present

## 2016-12-18 DIAGNOSIS — I959 Hypotension, unspecified: Secondary | ICD-10-CM | POA: Diagnosis not present

## 2016-12-18 DIAGNOSIS — E876 Hypokalemia: Secondary | ICD-10-CM | POA: Diagnosis not present

## 2016-12-18 DIAGNOSIS — R0789 Other chest pain: Secondary | ICD-10-CM | POA: Diagnosis not present

## 2016-12-18 DIAGNOSIS — M797 Fibromyalgia: Secondary | ICD-10-CM | POA: Diagnosis present

## 2016-12-18 DIAGNOSIS — Z981 Arthrodesis status: Secondary | ICD-10-CM

## 2016-12-18 DIAGNOSIS — Z9884 Bariatric surgery status: Secondary | ICD-10-CM

## 2016-12-18 DIAGNOSIS — G4700 Insomnia, unspecified: Secondary | ICD-10-CM | POA: Diagnosis present

## 2016-12-18 DIAGNOSIS — M542 Cervicalgia: Secondary | ICD-10-CM | POA: Diagnosis not present

## 2016-12-18 DIAGNOSIS — J45909 Unspecified asthma, uncomplicated: Secondary | ICD-10-CM | POA: Diagnosis present

## 2016-12-18 DIAGNOSIS — Z882 Allergy status to sulfonamides status: Secondary | ICD-10-CM

## 2016-12-18 DIAGNOSIS — K219 Gastro-esophageal reflux disease without esophagitis: Secondary | ICD-10-CM | POA: Diagnosis present

## 2016-12-18 DIAGNOSIS — I10 Essential (primary) hypertension: Secondary | ICD-10-CM | POA: Diagnosis not present

## 2016-12-18 DIAGNOSIS — E860 Dehydration: Secondary | ICD-10-CM | POA: Diagnosis present

## 2016-12-18 DIAGNOSIS — G8929 Other chronic pain: Secondary | ICD-10-CM | POA: Diagnosis not present

## 2016-12-18 DIAGNOSIS — Z8249 Family history of ischemic heart disease and other diseases of the circulatory system: Secondary | ICD-10-CM

## 2016-12-18 DIAGNOSIS — E1142 Type 2 diabetes mellitus with diabetic polyneuropathy: Secondary | ICD-10-CM | POA: Diagnosis not present

## 2016-12-18 DIAGNOSIS — R7989 Other specified abnormal findings of blood chemistry: Secondary | ICD-10-CM | POA: Diagnosis not present

## 2016-12-18 DIAGNOSIS — I517 Cardiomegaly: Secondary | ICD-10-CM | POA: Diagnosis not present

## 2016-12-18 DIAGNOSIS — K529 Noninfective gastroenteritis and colitis, unspecified: Secondary | ICD-10-CM | POA: Diagnosis present

## 2016-12-18 DIAGNOSIS — R531 Weakness: Secondary | ICD-10-CM | POA: Diagnosis not present

## 2016-12-18 DIAGNOSIS — Z87891 Personal history of nicotine dependence: Secondary | ICD-10-CM

## 2016-12-18 DIAGNOSIS — Z79899 Other long term (current) drug therapy: Secondary | ICD-10-CM

## 2016-12-18 DIAGNOSIS — F329 Major depressive disorder, single episode, unspecified: Secondary | ICD-10-CM | POA: Diagnosis present

## 2016-12-18 DIAGNOSIS — Z888 Allergy status to other drugs, medicaments and biological substances status: Secondary | ICD-10-CM

## 2016-12-19 ENCOUNTER — Inpatient Hospital Stay (HOSPITAL_COMMUNITY)
Admission: EM | Admit: 2016-12-19 | Discharge: 2016-12-21 | DRG: 684 | Disposition: A | Discharge status: Home / Self Care | Payer: Medicare Other | Attending: Internal Medicine | Admitting: Internal Medicine

## 2016-12-19 ENCOUNTER — Emergency Department (HOSPITAL_COMMUNITY): Payer: Medicare Other

## 2016-12-19 ENCOUNTER — Encounter (HOSPITAL_COMMUNITY): Payer: Self-pay | Admitting: Nurse Practitioner

## 2016-12-19 DIAGNOSIS — R112 Nausea with vomiting, unspecified: Secondary | ICD-10-CM | POA: Insufficient documentation

## 2016-12-19 DIAGNOSIS — R031 Nonspecific low blood-pressure reading: Secondary | ICD-10-CM

## 2016-12-19 DIAGNOSIS — Z8249 Family history of ischemic heart disease and other diseases of the circulatory system: Secondary | ICD-10-CM | POA: Diagnosis not present

## 2016-12-19 DIAGNOSIS — E876 Hypokalemia: Secondary | ICD-10-CM | POA: Diagnosis not present

## 2016-12-19 DIAGNOSIS — E1142 Type 2 diabetes mellitus with diabetic polyneuropathy: Secondary | ICD-10-CM | POA: Diagnosis not present

## 2016-12-19 DIAGNOSIS — R778 Other specified abnormalities of plasma proteins: Secondary | ICD-10-CM

## 2016-12-19 DIAGNOSIS — M542 Cervicalgia: Secondary | ICD-10-CM | POA: Diagnosis not present

## 2016-12-19 DIAGNOSIS — R7989 Other specified abnormal findings of blood chemistry: Secondary | ICD-10-CM

## 2016-12-19 DIAGNOSIS — Z833 Family history of diabetes mellitus: Secondary | ICD-10-CM | POA: Diagnosis not present

## 2016-12-19 DIAGNOSIS — Z9884 Bariatric surgery status: Secondary | ICD-10-CM | POA: Diagnosis not present

## 2016-12-19 DIAGNOSIS — F419 Anxiety disorder, unspecified: Secondary | ICD-10-CM | POA: Diagnosis not present

## 2016-12-19 DIAGNOSIS — K529 Noninfective gastroenteritis and colitis, unspecified: Secondary | ICD-10-CM

## 2016-12-19 DIAGNOSIS — J45909 Unspecified asthma, uncomplicated: Secondary | ICD-10-CM | POA: Diagnosis not present

## 2016-12-19 DIAGNOSIS — M797 Fibromyalgia: Secondary | ICD-10-CM | POA: Diagnosis not present

## 2016-12-19 DIAGNOSIS — R531 Weakness: Secondary | ICD-10-CM

## 2016-12-19 DIAGNOSIS — N179 Acute kidney failure, unspecified: Secondary | ICD-10-CM | POA: Diagnosis present

## 2016-12-19 DIAGNOSIS — K219 Gastro-esophageal reflux disease without esophagitis: Secondary | ICD-10-CM | POA: Diagnosis not present

## 2016-12-19 DIAGNOSIS — I517 Cardiomegaly: Secondary | ICD-10-CM | POA: Diagnosis not present

## 2016-12-19 DIAGNOSIS — Z87891 Personal history of nicotine dependence: Secondary | ICD-10-CM | POA: Diagnosis not present

## 2016-12-19 DIAGNOSIS — R0789 Other chest pain: Secondary | ICD-10-CM

## 2016-12-19 DIAGNOSIS — I10 Essential (primary) hypertension: Secondary | ICD-10-CM | POA: Diagnosis not present

## 2016-12-19 DIAGNOSIS — R42 Dizziness and giddiness: Secondary | ICD-10-CM | POA: Diagnosis present

## 2016-12-19 DIAGNOSIS — D649 Anemia, unspecified: Secondary | ICD-10-CM | POA: Diagnosis not present

## 2016-12-19 DIAGNOSIS — R0600 Dyspnea, unspecified: Secondary | ICD-10-CM | POA: Diagnosis not present

## 2016-12-19 DIAGNOSIS — E860 Dehydration: Secondary | ICD-10-CM | POA: Diagnosis not present

## 2016-12-19 DIAGNOSIS — I959 Hypotension, unspecified: Secondary | ICD-10-CM

## 2016-12-19 DIAGNOSIS — I9589 Other hypotension: Secondary | ICD-10-CM | POA: Diagnosis not present

## 2016-12-19 DIAGNOSIS — G4700 Insomnia, unspecified: Secondary | ICD-10-CM | POA: Diagnosis not present

## 2016-12-19 DIAGNOSIS — Z6838 Body mass index (BMI) 38.0-38.9, adult: Secondary | ICD-10-CM | POA: Diagnosis not present

## 2016-12-19 DIAGNOSIS — G8929 Other chronic pain: Secondary | ICD-10-CM | POA: Diagnosis not present

## 2016-12-19 DIAGNOSIS — Z981 Arthrodesis status: Secondary | ICD-10-CM | POA: Diagnosis not present

## 2016-12-19 DIAGNOSIS — F329 Major depressive disorder, single episode, unspecified: Secondary | ICD-10-CM | POA: Diagnosis not present

## 2016-12-19 DIAGNOSIS — M1990 Unspecified osteoarthritis, unspecified site: Secondary | ICD-10-CM | POA: Diagnosis not present

## 2016-12-19 LAB — COMPREHENSIVE METABOLIC PANEL
ALBUMIN: 3.3 g/dL — AB (ref 3.5–5.0)
ALK PHOS: 127 U/L — AB (ref 38–126)
ALT: 55 U/L — ABNORMAL HIGH (ref 14–54)
ALT: 55 U/L — AB (ref 14–54)
ANION GAP: 6 (ref 5–15)
AST: 22 U/L (ref 15–41)
AST: 25 U/L (ref 15–41)
Albumin: 3.4 g/dL — ABNORMAL LOW (ref 3.5–5.0)
Alkaline Phosphatase: 120 U/L (ref 38–126)
Anion gap: 11 (ref 5–15)
BILIRUBIN TOTAL: 0.4 mg/dL (ref 0.3–1.2)
BUN: 36 mg/dL — AB (ref 6–20)
BUN: 33 mg/dL — AB (ref 6–20)
CALCIUM: 7.9 mg/dL — AB (ref 8.9–10.3)
CHLORIDE: 108 mmol/L (ref 101–111)
CHLORIDE: 110 mmol/L (ref 101–111)
CO2: 22 mmol/L (ref 22–32)
CO2: 20 mmol/L — AB (ref 22–32)
CREATININE: 2.46 mg/dL — AB (ref 0.44–1.00)
Calcium: 7.4 mg/dL — ABNORMAL LOW (ref 8.9–10.3)
Creatinine, Ser: 2.88 mg/dL — ABNORMAL HIGH (ref 0.44–1.00)
GFR calc Af Amer: 19 mL/min — ABNORMAL LOW (ref >60)
GFR calc non Af Amer: 20 mL/min — ABNORMAL LOW (ref >60)
GFR, EST AFRICAN AMERICAN: 23 mL/min — AB (ref >60)
GFR, EST NON AFRICAN AMERICAN: 16 mL/min — AB (ref >60)
GLUCOSE: 119 mg/dL — AB (ref 65–99)
Glucose, Bld: 130 mg/dL — ABNORMAL HIGH (ref 65–99)
POTASSIUM: 3.3 mmol/L — AB (ref 3.5–5.1)
Potassium: 3.5 mmol/L (ref 3.5–5.1)
SODIUM: 141 mmol/L (ref 135–145)
Sodium: 136 mmol/L (ref 135–145)
TOTAL PROTEIN: 5.9 g/dL — AB (ref 6.5–8.1)
Total Bilirubin: 0.3 mg/dL (ref 0.3–1.2)
Total Protein: 6.5 g/dL (ref 6.5–8.1)

## 2016-12-19 LAB — CBC WITH DIFFERENTIAL/PLATELET
BASOS ABS: 0 10*3/uL (ref 0.0–0.1)
Basophils Relative: 0 %
EOS PCT: 2 %
Eosinophils Absolute: 0.2 10*3/uL (ref 0.0–0.7)
HCT: 38.9 % (ref 36.0–46.0)
Hemoglobin: 12.9 g/dL (ref 12.0–15.0)
LYMPHS PCT: 33 %
Lymphs Abs: 2.9 10*3/uL (ref 0.7–4.0)
MCH: 26.9 pg (ref 26.0–34.0)
MCHC: 33.2 g/dL (ref 30.0–36.0)
MCV: 81.2 fL (ref 78.0–100.0)
MONO ABS: 0.7 10*3/uL (ref 0.1–1.0)
MONOS PCT: 8 %
Neutro Abs: 5.1 10*3/uL (ref 1.7–7.7)
Neutrophils Relative %: 57 %
Platelets: 181 10*3/uL (ref 150–400)
RBC: 4.79 MIL/uL (ref 3.87–5.11)
RDW: 16.5 % — ABNORMAL HIGH (ref 11.5–15.5)
WBC: 8.9 10*3/uL (ref 4.0–10.5)

## 2016-12-19 LAB — TROPONIN I
TROPONIN I: 0.08 ng/mL — AB (ref <0.03)
Troponin I: 0.08 ng/mL (ref <0.03)

## 2016-12-19 LAB — SODIUM, URINE, RANDOM

## 2016-12-19 LAB — BASIC METABOLIC PANEL
ANION GAP: 11 (ref 5–15)
BUN: 40 mg/dL — ABNORMAL HIGH (ref 6–20)
CALCIUM: 7.9 mg/dL — AB (ref 8.9–10.3)
CHLORIDE: 100 mmol/L — AB (ref 101–111)
CO2: 21 mmol/L — AB (ref 22–32)
Creatinine, Ser: 3.86 mg/dL — ABNORMAL HIGH (ref 0.44–1.00)
GFR calc non Af Amer: 11 mL/min — ABNORMAL LOW (ref >60)
GFR, EST AFRICAN AMERICAN: 13 mL/min — AB (ref >60)
GLUCOSE: 102 mg/dL — AB (ref 65–99)
POTASSIUM: 3.1 mmol/L — AB (ref 3.5–5.1)
Sodium: 132 mmol/L — ABNORMAL LOW (ref 135–145)

## 2016-12-19 LAB — URINALYSIS, ROUTINE W REFLEX MICROSCOPIC
BILIRUBIN URINE: NEGATIVE
GLUCOSE, UA: NEGATIVE mg/dL
Hgb urine dipstick: NEGATIVE
KETONES UR: NEGATIVE mg/dL
NITRITE: NEGATIVE
PH: 5 (ref 5.0–8.0)
Protein, ur: 30 mg/dL — AB
Specific Gravity, Urine: 1.014 (ref 1.005–1.030)

## 2016-12-19 LAB — D-DIMER, QUANTITATIVE: D-Dimer, Quant: 0.36 ug/mL-FEU (ref 0.00–0.50)

## 2016-12-19 LAB — MRSA PCR SCREENING: MRSA by PCR: NEGATIVE

## 2016-12-19 LAB — MAGNESIUM: MAGNESIUM: 2 mg/dL (ref 1.7–2.4)

## 2016-12-19 LAB — CREATININE, URINE, RANDOM: Creatinine, Urine: 192.92 mg/dL

## 2016-12-19 LAB — BRAIN NATRIURETIC PEPTIDE: B Natriuretic Peptide: 27.8 pg/mL (ref 0.0–100.0)

## 2016-12-19 MED ORDER — ASPIRIN 81 MG PO CHEW
324.0000 mg | CHEWABLE_TABLET | Freq: Once | ORAL | Status: AC
  Administered 2016-12-19: 324 mg via ORAL
  Filled 2016-12-19: qty 4

## 2016-12-19 MED ORDER — ALBUTEROL SULFATE (2.5 MG/3ML) 0.083% IN NEBU: 2.5000 mg | INHALATION_SOLUTION | Freq: Four times a day (QID) | RESPIRATORY_TRACT | Status: DC | PRN

## 2016-12-19 MED ORDER — KCL IN DEXTROSE-NACL 20-5-0.45 MEQ/L-%-% IV SOLN
INTRAVENOUS | Status: DC
  Administered 2016-12-19 – 2016-12-21 (×6): via INTRAVENOUS
  Filled 2016-12-19 (×7): qty 1000

## 2016-12-19 MED ORDER — ALBUTEROL SULFATE HFA 108 (90 BASE) MCG/ACT IN AERS
1.0000 | INHALATION_SPRAY | Freq: Four times a day (QID) | RESPIRATORY_TRACT | Status: DC | PRN
  Filled 2016-12-19: qty 6.7

## 2016-12-19 MED ORDER — HYDROCODONE-ACETAMINOPHEN 5-325 MG PO TABS
1.0000 | ORAL_TABLET | Freq: Once | ORAL | Status: AC
  Administered 2016-12-19: 1 via ORAL
  Filled 2016-12-19: qty 1

## 2016-12-19 MED ORDER — SODIUM CHLORIDE 0.9 % IV BOLUS (SEPSIS)
1000.0000 mL | Freq: Once | INTRAVENOUS | Status: AC
  Administered 2016-12-19: 1000 mL via INTRAVENOUS

## 2016-12-19 MED ORDER — PANTOPRAZOLE SODIUM 40 MG PO TBEC
40.0000 mg | DELAYED_RELEASE_TABLET | Freq: Every day | ORAL | Status: DC
  Administered 2016-12-20 – 2016-12-21 (×2): 40 mg via ORAL
  Filled 2016-12-19 (×2): qty 1

## 2016-12-19 MED ORDER — ONDANSETRON 4 MG PO TBDP
4.0000 mg | ORAL_TABLET | Freq: Once | ORAL | Status: AC
  Administered 2016-12-19: 4 mg via ORAL
  Filled 2016-12-19: qty 1

## 2016-12-19 MED ORDER — ACETAMINOPHEN 325 MG PO TABS
650.0000 mg | ORAL_TABLET | Freq: Four times a day (QID) | ORAL | Status: DC | PRN
  Administered 2016-12-19: 650 mg via ORAL
  Filled 2016-12-19: qty 2

## 2016-12-19 MED ORDER — ONDANSETRON HCL 4 MG PO TABS
4.0000 mg | ORAL_TABLET | Freq: Three times a day (TID) | ORAL | Status: DC | PRN
  Administered 2016-12-20 – 2016-12-21 (×3): 4 mg via ORAL
  Filled 2016-12-19 (×3): qty 1

## 2016-12-19 MED ORDER — POTASSIUM CHLORIDE CRYS ER 20 MEQ PO TBCR
40.0000 meq | EXTENDED_RELEASE_TABLET | Freq: Once | ORAL | Status: AC
  Administered 2016-12-19: 40 meq via ORAL
  Filled 2016-12-19: qty 2

## 2016-12-19 MED ORDER — GABAPENTIN 100 MG PO CAPS
100.0000 mg | ORAL_CAPSULE | Freq: Four times a day (QID) | ORAL | Status: DC
  Administered 2016-12-19 – 2016-12-21 (×9): 100 mg via ORAL
  Filled 2016-12-19 (×9): qty 1

## 2016-12-19 MED ORDER — ENOXAPARIN SODIUM 30 MG/0.3ML ~~LOC~~ SOLN
30.0000 mg | SUBCUTANEOUS | Status: DC
  Administered 2016-12-19: 30 mg via SUBCUTANEOUS
  Filled 2016-12-19: qty 0.3

## 2016-12-19 MED ORDER — ESCITALOPRAM OXALATE 10 MG PO TABS
10.0000 mg | ORAL_TABLET | Freq: Every day | ORAL | Status: DC
  Administered 2016-12-19 – 2016-12-21 (×3): 10 mg via ORAL
  Filled 2016-12-19 (×3): qty 1

## 2016-12-19 MED ORDER — SODIUM CHLORIDE 0.9 % IV BOLUS (SEPSIS)
500.0000 mL | Freq: Once | INTRAVENOUS | Status: AC
  Administered 2016-12-19: 500 mL via INTRAVENOUS

## 2016-12-19 MED ORDER — TRAMADOL HCL 50 MG PO TABS
50.0000 mg | ORAL_TABLET | Freq: Two times a day (BID) | ORAL | Status: DC
  Administered 2016-12-19 – 2016-12-21 (×5): 50 mg via ORAL
  Filled 2016-12-19 (×5): qty 1

## 2016-12-19 NOTE — Assessment & Plan Note (Signed)
Unclear if her nausea is associated with her pain or illness. Reports she had elevated liver enzymes but doesn't have any data to review from her admission  - CMP, CBC, lipase and amylase - zofran - advised to obtain hospital admission records.

## 2016-12-19 NOTE — ED Triage Notes (Signed)
Pt states that she has been experiencing low BP secondary to antihypertensive she was prescribed, was evaluated by her PCP yesterday and advised to come to the ED should the BP remain low. C/o fatigue and dizziness.

## 2016-12-19 NOTE — ED Notes (Addendum)
Patient given ginger ale per PO challenge. Tolerating well at this time.

## 2016-12-19 NOTE — Assessment & Plan Note (Signed)
Blood pressure was lower today than what it usually is.  - advised to hold her lisinopril until her nausea and vomiting has stopped

## 2016-12-19 NOTE — ED Notes (Addendum)
RN Brewing technologist unsuccessful at starting IV. IV team consulted. Fluids delayed due to lack of IV access. Provider aware.

## 2016-12-19 NOTE — Assessment & Plan Note (Signed)
Acute on chronic. Has a history of surgery of her back. She fell hard and was admitted for this. Doesn't appear to have any neurological compromise. I don't have any MRI or xray to review  - continue tramadol  - advised to obtain her imaging from her admission  - she has follow up scheduled with her neurosurgeon.

## 2016-12-19 NOTE — H&P (Addendum)
History and Physical    [name] GSS:[id number] DOB: 07-02-1955 DOA: 12/19/2016  PCP: Myrlene Broker, MD  Patient coming from: home   Chief Complaint: weakness, hypotension  HPI: [name] is a 61 y.o. female with medical history significant of htn, dm, chf, dvt not anticoagulated, presents with one week of light-headedness. Symptoms began about a week ago. Experienced lots of nausea, vomiting, and diarrhea, which have improved. Was visiting family in [place] when fell and was hhospitalized, though she is unsure what she was diagnosed with, saying it she thought they mainly treated her chronic neck pain. Since discharge she returned to her home here and has continued to fee light-headed.she checks her bp at home and says it has been low, as low as 70s systolic. Her by mouth intake has remained low and she says she has not been urinating much. Her nausea and vomiting have improved significantly however. She says she sometimes gets chest pressure when she lays down. No shortness of breath. nocurrent chest or other pain save for chronic pain in neck. No fevers. No dysuria.   ED Course: fluids, aspirin, cxr, labs  Review of Systems: As per HPI otherwise 10 point review of systems negative.   Past Medical History:  Diagnosis Date  . Anemia    takes Ferrous Sulfate daily  . Anxiety    takes Citalopram daily  . Arthritis   . Asthma    2004-prior to gastric bypass and no problems since  . CHF (congestive heart failure) (HCC)    takes Lasix daily as needed  . Chronic back pain    spondylolisthesis/stenosis/radiculopathy  . Complication of anesthesia yrs ago   slow to wake up  . Depression   . Diabetes (H[facility])   . DVT (deep venous thrombosis) (HCC) 01-17-13   past hx. -tx.5-6 yrs ago bilateral legs, occ. sporadic swelling, has IVC filter implanted  . Dysrhythmia    "heart tends to flutter"  . Fibromyalgia   . Fracture    right foot and is in a cam boot  .  Gallstones   . GERD (gastroesophageal reflux disease)    hx of-no meds now  . Heart murmur   . History of bronchitis 2012 or 2013  . History of colon polyps   . Hypertension    takes Metoprolol daily  . Insomnia    takes Melatonin daily  . Joint pain   . Joint swelling   . Pelvic floor dysfunction   . Peripheral neuropathy   . Pneumonia 90's   hx of  . S/P gastric bypass   . Sleep apnea    no cpap used in many yrs after weight lost-no machine now  . Urinary frequency   . Urinary urgency   . Vitamin D deficiency     Past Surgical History:  Procedure Laterality Date  . BALLOON DILATION N/A 01/31/2013   Procedure: BALLOON DILATION;  Surgeon: Willis Modena, MD;  [facility];  Service: Endoscopy;  Laterality: N/A;  . CHOLECYSTECTOMY  2007  . COLONOSCOPY    . DILATION AND CURETTAGE OF UTERUS  yrs ago  . ESOPHAGOGASTRODUODENOSCOPY (EGD) WITH PROPOFOL  03/01/2012   Procedure: ESOPHAGOGASTRODUODENOSCOPY (EGD) WITH PROPOFOL;  Surgeon: Willis Modena, MD;  [facility];  Service: Endoscopy;  Laterality: N/A;  . ESOPHAGOGASTRODUODENOSCOPY (EGD) WITH PROPOFOL N/A 01/31/2013   Procedure: ESOPHAGOGASTRODUODENOSCOPY (EGD) WITH PROPOFOL;  Surgeon: Willis Modena, MD;  [facility];  Service: Endoscopy;  Laterality: N/A;  . ESOPHAGOGASTRODUODENOSCOPY (EGD) WITH PROPOFOL  N/A 09/02/2015   Procedure: ESOPHAGOGASTRODUODENOSCOPY (EGD) WITH PROPOFOL;  Surgeon: Gatha Mayer, MD;  [facility];  Service: Endoscopy;  Laterality: N/A;  . GASTRIC BY-PASS  2004  . HERNIA REPAIR  2005  . INSERTION OF VENA CAVA FILTER  01-17-13   inserted 2004- "abdomen"  . LIGAMENT REPAIR Right 1987   Rt. knee scope  . MAXIMUM ACCESS (MAS)POSTERIOR LUMBAR INTERBODY FUSION (PLIF) 2 LEVEL N/A 06/07/2014   Procedure: L4-5 L5-S1 FOR MAXIMUM ACCESS (MAS) POSTERIOR LUMBAR INTERBODY FUSION ;  Surgeon: Erline Levine, MD;  Location: [facility];  Service: Neurosurgery;  Laterality: N/A;   L4-5 L5-S1 FOR MAXIMUM ACCESS (MAS) POSTERIOR LUMBAR INTERBODY FUSION   . TONSILLECTOMY     as child     reports that she quit smoking about 7 years ago. Her smoking use included Cigarettes. She has a 5.00 pack-year smoking history. She has never used smokeless tobacco. She reports that she drinks alcohol. She reports that she does not use drugs.  Allergies  Allergen Reactions  . Carafate [Sucralfate] Hives    Other reaction(s): Angioedema (ALLERGY/intolerance) hives scratching  . Sulfonamide Derivatives Rash    REACTION: Syncope    Family History  Problem Relation Age of Onset  . Diabetes Mother   . Heart disease Father   . Hypertension Father   . Prostate cancer Father   . Kidney disease Brother   . Colon cancer Neg Hx   . Colon polyps Neg Hx   . Stomach cancer Neg Hx   . Esophageal cancer Neg Hx   . Pancreatic cancer Neg Hx   . Liver disease Neg Hx    Unacceptable: Noncontributory, unremarkable, or negative. Acceptable: Family history reviewed and not pertinent (If you reviewed it)  Prior to Admission medications   Medication Sig Start Date End Date Taking? Authorizing Provider  ACCU-CHEK SOFTCLIX LANCETS lancets 1 each by Other route 2 (two) times daily. Use to help check blood sugars twice a day Dx E11.9 10/29/15   Hoyt Koch, MD  albuterol (PROVENTIL HFA;VENTOLIN HFA) 108 (90 Base) MCG/ACT inhaler Inhale 1-2 puffs into the lungs every 6 (six) hours as needed for wheezing or shortness of breath. 02/11/16   NcheCharlene Brooke, NP  Alcohol Swabs 70 % PADS Use to wipe area to check blood sugars daily Dx E11.9 10/29/15   Hoyt Koch, MD  aspirin EC 81 MG tablet Take 81 mg by mouth daily.    [provider]  benzonatate (TESSALON) 100 MG capsule Take by mouth 3 (three) times daily as needed for cough.    [provider]  BLACK COHOSH PO Take 1 tablet by mouth daily.    [provider]  Blood Glucose Calibration (ACCU-CHEK  AVIVA) SOLN Use as directed 10/29/15   Hoyt Koch, MD  Blood Glucose Monitoring Suppl (ACCU-CHEK AVIVA PLUS) w/Device KIT Use as directed to check blood sugars daily Dx E11.9 10/29/15   Hoyt Koch, MD  buPROPion [facility] SR) 150 MG 12 hr tablet Take 1 tablet (150 mg total) by mouth 2 (two) times daily. 08/30/16   Hoyt Koch, MD  CALCIUM PO Take 1 tablet by mouth daily. '600mg'$     [provider]  CRANBERRY PO Take 1 tablet by mouth daily.    [provider]  doxycycline (MONODOX) 100 MG capsule Take 1 capsule (100 mg total) by mouth 2 (two) times daily. 08/30/16   Hoyt Koch, MD  escitalopram (LEXAPRO) 10 MG tablet Take  1 tablet (10 mg total) by mouth daily. 07/09/16   Hoyt Koch, MD  ferrous sulfate 325 (65 FE) MG tablet Take 325 mg by mouth daily.     [provider]  furosemide (LASIX) 40 MG tablet TAKE 1 TABLET (40 MG TOTAL) BY MOUTH DAILY AS NEEDED FOR EDEMA. 12/15/15   Hoyt Koch, MD  gabapentin (NEURONTIN) 400 MG capsule TAKE 1 CAPSULE FOUR TIMES DAILY 08/26/15   Hoyt Koch, MD  glucose blood (ACCU-CHEK AVIVA PLUS) test strip 1 each by Other route 2 (two) times daily. Use to check blood sugars twice a day Dx E11.9 10/29/15   Hoyt Koch, MD  HYDROcodone-homatropine [facility]) 5-1.5 MG/5ML syrup Take 5 mLs by mouth every 8 (eight) hours as needed for cough. 08/30/16   Hoyt Koch, MD  MELATONIN PO Take 1 tablet by mouth at bedtime as needed. For sleep    [provider]  metoCLOPramide (REGLAN) 5 MG tablet Take 1 tablet (5 mg total) by mouth every 6 (six) hours as needed for nausea. 08/16/14   Hoyt Koch, MD  metoprolol tartrate (LOPRESSOR) 25 MG tablet TAKE 1/2 TABLET (12.5 MG TOTAL) BY MOUTH 2 (TWO) TIMES DAILY. 02/06/16   Hoyt Koch, MD  Multiple Vitamins-Minerals (MULTIVITAMINS THER. W/MINERALS) TABS Take 1 tablet by mouth daily.     [provider]  nitroGLYCERIN (NITROSTAT) 0.4 MG SL tablet Place 0.4 mg under the tongue every 5 (five) minutes as needed for chest pain.     [provider]  ondansetron (ZOFRAN) 4 MG tablet Take 1 tablet (4 mg total) by mouth every 8 (eight) hours as needed for nausea or vomiting. 12/17/16   Rosemarie Ax, MD  pantoprazole (PROTONIX) 40 MG tablet Take 1 tablet (40 mg total) by mouth daily before breakfast. 09/02/15   Gatha Mayer, MD  polyethylene glycol (MIRALAX / Floria Raveling) packet Take 17 g by mouth daily.    [provider]  potassium chloride SA (K-DUR,KLOR-CON) 20 MEQ tablet Take 1 tablet (20 mEq total) by mouth daily. 07/26/16   Hoyt Koch, MD  predniSONE (DELTASONE) 20 MG tablet Take 2 tablets (40 mg total) by mouth daily with breakfast. 08/30/16   Hoyt Koch, MD  promethazine (PHENERGAN) 25 MG tablet Take 25 mg by mouth every 6 (six) hours as needed for nausea.    [provider]  traMADol (ULTRAM) 50 MG tablet Take 1 tablet (50 mg total) by mouth 4 (four) times daily. 08/30/16   Hoyt Koch, MD  vitamin C (ASCORBIC ACID) 500 MG tablet Take 500 mg by mouth daily.    [provider]  VITAMIN D, CHOLECALCIFEROL, PO Take 1,200 Units by mouth daily.    [provider]    Physical Exam: Vitals:   12/19/16 0700 12/19/16 0715 12/19/16 0730 12/19/16 0745  BP: 96/64  (!) 73/52   Pulse: 77  66 65  Resp: '13 11 16 16  '$ Temp:      TempSrc:      SpO2: 99%  100% 97%    Constitutional: NAD, calm, comfortable Vitals:   12/19/16 0700 12/19/16 0715 12/19/16 0730 12/19/16 0745  BP: 96/64  (!) 73/52   Pulse: 77  66 65  Resp: '13 11 16 16  '$ Temp:      TempSrc:      SpO2: 99%  100% 97%   Eyes: PERRL, lids and conjunctivae normal ENMT: Mucous membranes are moist.  Neck: supple Respiratory:  clear to auscultation bilaterally, no wheezing, no crackles. .  Cardiovascular: Regular rate and rhythm, moderate  holosystolicmurmur Abdomen: no tenderness, no masses palpated. No hepatosplenomegaly. Bowel sounds positive.  Musculoskeletal: no clubbing / cyanosis. Skin: no rashes, lesions, ulcers. No induration Neurologic: CN 2-12 grossly intact. Strength4+/5in all extremities Psychiatric: aao x3   Labs on Admission: I have personally reviewed following labs and imaging studies  CBC:  Recent Labs Lab 12/17/16 1205 12/19/16 0434  WBC 8.4 8.9  NEUTROABS 5.5 5.1  HGB 14.5 12.9  HCT 44.9 38.9  MCV 82.8 81.2  PLT 210.0 132   Basic Metabolic Panel:  Recent Labs Lab 12/17/16 1205 12/19/16 0434 12/19/16 0444  NA 133* 132*  --   K 3.2* 3.1*  --   CL 100 100*  --   CO2 22 21*  --   GLUCOSE 125* 102*  --   BUN 26* 40*  --   CREATININE 2.62* 3.86*  --   CALCIUM 8.9 7.9*  --   MG  --   --  2.0   GFR: Estimated Creatinine Clearance: 17.3 mL/min (A) (by C-G formula based on SCr of 3.86 mg/dL (H)). Liver Function Tests:  Recent Labs Lab 12/17/16 1205  AST 34  ALT 80*  ALKPHOS 151*  BILITOT 0.5  PROT 6.3  ALBUMIN 3.6    Recent Labs Lab 12/17/16 1205  LIPASE 29.0  AMYLASE 53   No results for input(s): AMMONIA in the last 168 hours. Coagulation Profile: No results for input(s): INR, PROTIME in the last 168 hours. Cardiac Enzymes:  Recent Labs Lab 12/19/16 0444  TROPONINI 0.08*   BNP (last 3 results)  Recent Labs  03/04/16 1457  PROBNP 58.0   HbA1C: No results for input(s): HGBA1C in the last 72 hours. CBG: No results for input(s): GLUCAP in the last 168 hours. Lipid Profile: No results for input(s): CHOL, HDL, LDLCALC, TRIG, CHOLHDL, LDLDIRECT in the last 72 hours. Thyroid Function Tests: No results for input(s): TSH, T4TOTAL, FREET4, T3FREE, THYROIDAB in the last 72 hours. Anemia Panel: No results for input(s): VITAMINB12, FOLATE, FERRITIN, TIBC, IRON, RETICCTPCT in the last 72 hours. Urine analysis:    Component Value Date/Time   COLORURINE YELLOW  12/11/2015 [place] 12/11/2015 1542   LABSPEC 1.010 12/11/2015 1542   PHURINE 6.0 12/11/2015 1542   GLUCOSEU NEGATIVE 12/11/2015 1542   HGBUR NEGATIVE 12/11/2015 1542   HGBUR trace-intact 07/25/2007 1629   BILIRUBINUR negative 07/09/2016 [place] 12/11/2015 1542   PROTEINUR '15mg'$ /dL 07/09/2016 1338   PROTEINUR NEGATIVE 05/26/2014 1518   UROBILINOGEN negative 07/09/2016 1338   UROBILINOGEN 0.2 12/11/2015 1542   NITRITE negative 07/09/2016 1338   NITRITE NEGATIVE 12/11/2015 1542   LEUKOCYTESUR Negative 07/09/2016 1338    Radiological Exams on Admission: Dg Chest 2 View  Result Date: 12/19/2016 CLINICAL DATA:  Hypotension and dyspnea of 1 day duration EXAM: CHEST  2 VIEW COMPARISON:  03/04/2016 FINDINGS: Marked thoracolumbar scoliosis. Mild unchanged cardiomegaly. No airspace consolidation. No pleural effusion. Normal pulmonary vasculature. Stable linear scarring in the midleft lung. IMPRESSION: Mild cardiomegaly, unchanged. Thoracolumbar scoliosis. No consolidation or effusion. Electronically Signed   By: Andreas Newport M.D.   On: 12/19/2016 06:07    EKG: Independently reviewed. Nsr, normal axis, qrs widened,no st elevations or depressions or abnormal t waves  Assessment/Plan  57F hx htn, chf, dm, dvt, here hypotensive and with acute kidney injury, likely secondary to dehydration from gastroenteritis. ED note says vq scan ordered, but  don't see that order. No signs dvt, no tachypnea or hypoxia, no right heart strain on ekg, will hold on further w/u for now as clinic picture does not suggest PE.  # Gastroenteritis - one week nausea, vomiting, diarrhea, now much improved - zofran prn, IV fluids  # Acute kidney injury - cr 3.86 today, in the 2s last week at PCPs, from baseline of 1. Likely prerenal from dehydration - s/p 1 l ns in ed, 2nd liter ordered - push fluids - d51/2ns '@100'$ , careful to not overload given chf history - cmp q8 hours to start -  urine lytes to calculate fena - renally dose medications - consider attempting to obtain recent [place] hospitalization records - mild troponin elevation of 0.08, will trend  #Hypotension - think 2/2 to dehydration from acute gastroenteritis. Alert and oriented. - fluid repletion as above - step-down status for now  #Hypokalemia - 3.1, mild, likely 2/2 vomiting/diarrhea,now resolved. - s/p 1mq in ed - fluids at 1034mhr w/ 20 meq of k - trend q8 hrs for now  #DM - glucose wnl on admission - monitor glucose with metabolicprofiles  #HTN - here hypotensive.  - hold antihypertensives  #chronic pain, depression - given aki decrease tramadol frequencyfrom q6 to 12 and gabapenting dose from 400 q6 to 100 q6. Hold wellbutrin, continue escitalopram      DVT prophylaxis: lovenox renally dosed, scds Code Status: ful Family Communication: sister helen russell [phone] Plan: home Consults called: none for now Admission status: step-down3   NoDesma MaximD [facility] Pager [phone] 7PM-7AM, please contact night-coverage [email or web address] Password [email or web address]  12/19/2016, 7:52 AM

## 2016-12-19 NOTE — ED Notes (Signed)
Breakfast Tray given  

## 2016-12-19 NOTE — ED Notes (Signed)
She is awake and alert and in no distress. She is oriented x4 with clear speech.

## 2016-12-19 NOTE — ED Provider Notes (Signed)
[facility] DEPT Provider Note   CSN: [id number] Arrival date & time: 12/18/16  2350     History   Chief Complaint No chief complaint on file.    HPI   Blood pressure (!) 90/58, pulse 62, temperature 98.4 F (36.9 C), temperature source Oral, resp. rate 18, SpO2 97 %.  [name] is a 61 y.o. female with past medical history significant for CHF, anemia, DVT in the remote past, not anticoagulated,complaining of persistently low blood pressure with associated dizziness, lightheadedness, generalized fatigue onset 8 days ago. She had a fall (this was in [place]) she was hospitalized for the fall. It appears that she had some spinal stenosis noted on imaging. There was no intervention. She started having stabbing intermittent left-sided chest pain that comes in waves proximally 7 days ago. It last only a minute or 2 and associated with shortness of breath she states that it's worse when she talks or when she walks. She has a history of remote DVT is not anticoagulated. She took a flight home from [place] on August 29, it was 2 hours long. She denies any calf pain, peripheral edema. She was seen by her primary care physician Shanon Brow before yesterday and advised to DC her lisinopril, she's been checking her blood pressures at home and has been having systolics in the 46F. She has nausea and is eating and drinking less than normal. She denies any melena, hematochezia, abdominal pain. She endorses diarrhea.   Cardiology: Doylene Canard  Past Medical History:  Diagnosis Date  . Anemia    takes Ferrous Sulfate daily  . Anxiety    takes Citalopram daily  . Arthritis   . Asthma    2004-prior to gastric bypass and no problems since  . CHF (congestive heart failure) (HCC)    takes Lasix daily as needed  . Chronic back pain    spondylolisthesis/stenosis/radiculopathy  . Complication of anesthesia yrs ago   slow to wake up  . Depression   . Diabetes ([place])   . DVT (deep venous thrombosis)  ([place]) 01-17-13   past hx. -tx.5-6 yrs ago bilateral legs, occ. sporadic swelling, has IVC filter implanted  . Dysrhythmia    "heart tends to flutter"  . Fibromyalgia   . Fracture    right foot and is in a cam boot  . Gallstones   . GERD (gastroesophageal reflux disease)    hx of-no meds now  . Heart murmur   . History of bronchitis 2012 or 2013  . History of colon polyps   . Hypertension    takes Metoprolol daily  . Insomnia    takes Melatonin daily  . Joint pain   . Joint swelling   . Pelvic floor dysfunction   . Peripheral neuropathy   . Pneumonia 90's   hx of  . S/P gastric bypass   . Sleep apnea    no cpap used in many yrs after weight lost-no machine now  . Urinary frequency   . Urinary urgency   . Vitamin D deficiency     Patient Active Problem List   Diagnosis Date Noted  . S/P gastric bypass 08/18/2016  . Muscle cramps 07/09/2016  . Dysuria 05/26/2016  . Adjustment disorder with mixed anxiety and depressed mood 03/05/2016  . Weakness 03/05/2016  . Cervical disc disorder with radiculopathy of cervical region 02/27/2016  . Degenerative arthritis of right knee 02/27/2016  . Right knee pain 02/24/2016  . Left shoulder pain 02/24/2016  . Cough 02/24/2016  .  Routine general medical examination at a health care facility 12/12/2015  . Schatzki's ring   . Diarrhea 06/01/2015  . Lumbar radiculopathy 03/05/2015  . Peripheral neuropathy 01/17/2015  . Fibromyalgia 02/21/2011  . Essential hypertension 12/04/2006  . Hx of diabetes mellitus 10/20/2006  . Morbid obesity ([place]) 10/20/2006  . MDD (major depressive disorder) ([place]) 10/20/2006  . OBSTRUCTIVE SLEEP APNEA 10/20/2006  . OSTEOARTHRITIS 10/20/2006  . DVT, HX OF 10/20/2006    Past Surgical History:  Procedure Laterality Date  . BALLOON DILATION N/A 01/31/2013   Procedure: BALLOON DILATION;  Surgeon: Arta Silence, MD;  Location: [facility];  Service: Endoscopy;  Laterality: N/A;  . CHOLECYSTECTOMY  2007  .  COLONOSCOPY    . DILATION AND CURETTAGE OF UTERUS  yrs ago  . ESOPHAGOGASTRODUODENOSCOPY (EGD) WITH PROPOFOL  03/01/2012   Procedure: ESOPHAGOGASTRODUODENOSCOPY (EGD) WITH PROPOFOL;  Surgeon: Arta Silence, MD;  Location: [facility];  Service: Endoscopy;  Laterality: N/A;  . ESOPHAGOGASTRODUODENOSCOPY (EGD) WITH PROPOFOL N/A 01/31/2013   Procedure: ESOPHAGOGASTRODUODENOSCOPY (EGD) WITH PROPOFOL;  Surgeon: Arta Silence, MD;  Location: [facility];  Service: Endoscopy;  Laterality: N/A;  . ESOPHAGOGASTRODUODENOSCOPY (EGD) WITH PROPOFOL N/A 09/02/2015   Procedure: ESOPHAGOGASTRODUODENOSCOPY (EGD) WITH PROPOFOL;  Surgeon: Gatha Mayer, MD;  Location: [facility];  Service: Endoscopy;  Laterality: N/A;  . GASTRIC BY-PASS  2004  . HERNIA REPAIR  2005  . INSERTION OF VENA CAVA FILTER  01-17-13   inserted 2004- "abdomen"  . LIGAMENT REPAIR Right 1987   Rt. knee scope  . MAXIMUM ACCESS (MAS)POSTERIOR LUMBAR INTERBODY FUSION (PLIF) 2 LEVEL N/A 06/07/2014   Procedure: L4-5 L5-S1 FOR MAXIMUM ACCESS (MAS) POSTERIOR LUMBAR INTERBODY FUSION ;  Surgeon: Erline Levine, MD;  Location: [facility];  Service: Neurosurgery;  Laterality: N/A;  L4-5 L5-S1 FOR MAXIMUM ACCESS (MAS) POSTERIOR LUMBAR INTERBODY FUSION   . TONSILLECTOMY     as child    OB History    No data available       Home Medications    Prior to Admission medications   Medication Sig Start Date End Date Taking? Authorizing Provider  ACCU-CHEK SOFTCLIX LANCETS lancets 1 each by Other route 2 (two) times daily. Use to help check blood sugars twice a day Dx E11.9 10/29/15   Hoyt Koch, MD  albuterol (PROVENTIL HFA;VENTOLIN HFA) 108 (90 Base) MCG/ACT inhaler Inhale 1-2 puffs into the lungs every 6 (six) hours as needed for wheezing or shortness of breath. 02/11/16   NcheCharlene Brooke, NP  Alcohol Swabs 70 % PADS Use to wipe area to check blood sugars daily Dx E11.9 10/29/15   Hoyt Koch, MD  aspirin EC 81 MG  tablet Take 81 mg by mouth daily.    [provider]  benzonatate (TESSALON) 100 MG capsule Take by mouth 3 (three) times daily as needed for cough.    [provider]  BLACK COHOSH PO Take 1 tablet by mouth daily.    [provider]  Blood Glucose Calibration (ACCU-CHEK AVIVA) SOLN Use as directed 10/29/15   Hoyt Koch, MD  Blood Glucose Monitoring Suppl (ACCU-CHEK AVIVA PLUS) w/Device KIT Use as directed to check blood sugars daily Dx E11.9 10/29/15   Hoyt Koch, MD  buPROPion [facility] SR) 150 MG 12 hr tablet Take 1 tablet (150 mg total) by mouth 2 (two) times daily. 08/30/16   Hoyt Koch, MD  CALCIUM PO Take 1 tablet by mouth daily. '600mg'$     [provider]  Drusilla Kanner  PO Take 1 tablet by mouth daily.    [provider]  doxycycline (MONODOX) 100 MG capsule Take 1 capsule (100 mg total) by mouth 2 (two) times daily. 08/30/16   Myrlene Broker, MD  escitalopram (LEXAPRO) 10 MG tablet Take 1 tablet (10 mg total) by mouth daily. 07/09/16   Myrlene Broker, MD  ferrous sulfate 325 (65 FE) MG tablet Take 325 mg by mouth daily.     [provider]  furosemide (LASIX) 40 MG tablet TAKE 1 TABLET (40 MG TOTAL) BY MOUTH DAILY AS NEEDED FOR EDEMA. 12/15/15   Myrlene Broker, MD  gabapentin (NEURONTIN) 400 MG capsule TAKE 1 CAPSULE FOUR TIMES DAILY 08/26/15   Myrlene Broker, MD  glucose blood (ACCU-CHEK AVIVA PLUS) test strip 1 each by Other route 2 (two) times daily. Use to check blood sugars twice a day Dx E11.9 10/29/15   Myrlene Broker, MD  HYDROcodone-homatropine [facility]) 5-1.5 MG/5ML syrup Take 5 mLs by mouth every 8 (eight) hours as needed for cough. 08/30/16   Myrlene Broker, MD  MELATONIN PO Take 1 tablet by mouth at bedtime as needed. For sleep    [provider]  metoCLOPramide (REGLAN) 5 MG tablet Take 1 tablet (5 mg total) by mouth every 6 (six) hours as needed for  nausea. 08/16/14   Myrlene Broker, MD  metoprolol tartrate (LOPRESSOR) 25 MG tablet TAKE 1/2 TABLET (12.5 MG TOTAL) BY MOUTH 2 (TWO) TIMES DAILY. 02/06/16   Myrlene Broker, MD  Multiple Vitamins-Minerals (MULTIVITAMINS THER. W/MINERALS) TABS Take 1 tablet by mouth daily.     [provider]  nitroGLYCERIN (NITROSTAT) 0.4 MG SL tablet Place 0.4 mg under the tongue every 5 (five) minutes as needed for chest pain.     [provider]  ondansetron (ZOFRAN) 4 MG tablet Take 1 tablet (4 mg total) by mouth every 8 (eight) hours as needed for nausea or vomiting. 12/17/16   Myra Rude, MD  pantoprazole (PROTONIX) 40 MG tablet Take 1 tablet (40 mg total) by mouth daily before breakfast. 09/02/15   Iva Boop, MD  polyethylene glycol (MIRALAX / Ethelene Hal) packet Take 17 g by mouth daily.    [provider]  potassium chloride SA (K-DUR,KLOR-CON) 20 MEQ tablet Take 1 tablet (20 mEq total) by mouth daily. 07/26/16   Myrlene Broker, MD  predniSONE (DELTASONE) 20 MG tablet Take 2 tablets (40 mg total) by mouth daily with breakfast. 08/30/16   Myrlene Broker, MD  promethazine (PHENERGAN) 25 MG tablet Take 25 mg by mouth every 6 (six) hours as needed for nausea.    [provider]  traMADol (ULTRAM) 50 MG tablet Take 1 tablet (50 mg total) by mouth 4 (four) times daily. 08/30/16   Myrlene Broker, MD  vitamin C (ASCORBIC ACID) 500 MG tablet Take 500 mg by mouth daily.    [provider]  VITAMIN D, CHOLECALCIFEROL, PO Take 1,200 Units by mouth daily.    [provider]    Family History Family History  Problem Relation Age of Onset  . Diabetes Mother   . Heart disease Father   . Hypertension Father   . Prostate cancer Father   . Kidney disease Brother   . Colon cancer Neg Hx   . Colon polyps Neg Hx   . Stomach cancer Neg Hx   . Esophageal cancer Neg Hx   . Pancreatic cancer Neg Hx   . Liver disease  Neg Hx      Social History Social History  Substance Use Topics  . Smoking status: Former Smoker    Packs/day: 0.50    Years: 10.00    Types: Cigarettes    Quit date: 04/19/2009  . Smokeless tobacco: Never Used  . Alcohol use 0.0 oz/week     Comment: occ. social- wine-1 drink monthly     Allergies   Carafate [sucralfate] and Sulfonamide derivatives   Review of Systems Review of Systems  A complete review of systems was obtained and all systems are negative except as noted in the HPI and PMH.    Physical Exam Updated Vital Signs BP 96/64   Pulse 77   Temp 98.4 F (36.9 C) (Oral)   Resp 13   SpO2 99%   Physical Exam  Constitutional: She is oriented to person, place, and time. She appears well-developed and well-nourished. No distress.  HENT:  Head: Normocephalic.  Mouth/Throat: Oropharynx is clear and moist.  Eyes: Conjunctivae are normal.  Neck: Normal range of motion. No JVD present. No tracheal deviation present.  Cardiovascular: Normal rate, regular rhythm and intact distal pulses.   Radial pulse equal bilaterally  Pulmonary/Chest: Breath sounds normal. No stridor. No respiratory distress. She has no wheezes. She has no rales. She exhibits no tenderness.  Speaking in short sentences, appears dyspneic with increased work of breathing.   Abdominal: Soft. She exhibits no distension and no mass. There is no tenderness. There is no rebound and no guarding.  Musculoskeletal: Normal range of motion. She exhibits no edema or tenderness.  No calf asymmetry, superficial collaterals, palpable cords, edema, Homans sign negative bilaterally.    Neurological: She is alert and oriented to person, place, and time.  Skin: Skin is warm. She is not diaphoretic.  Psychiatric: She has a normal mood and affect.  Nursing note and vitals reviewed.    ED Treatments / Results  Labs (all labs ordered are listed, but only abnormal results are displayed) Labs Reviewed  CBC WITH  DIFFERENTIAL/PLATELET - Abnormal; Notable for the following:       Result Value   RDW 16.5 (*)    All other components within normal limits  BASIC METABOLIC PANEL - Abnormal; Notable for the following:    Sodium 132 (*)    Potassium 3.1 (*)    Chloride 100 (*)    CO2 21 (*)    Glucose, Bld 102 (*)    BUN 40 (*)    Creatinine, Ser 3.86 (*)    Calcium 7.9 (*)    GFR calc non Af Amer 11 (*)    GFR calc Af Amer 13 (*)    All other components within normal limits  TROPONIN I - Abnormal; Notable for the following:    Troponin I 0.08 (*)    All other components within normal limits  MAGNESIUM  BRAIN NATRIURETIC PEPTIDE  D-DIMER, QUANTITATIVE (NOT AT [facility])    EKG  EKG Interpretation  Date/Time:  Sunday December 19 2016 06:05:13 EDT Ventricular Rate:  64 PR Interval:    QRS Duration: 103 QT Interval:  478 QTC Calculation: 494 R Axis:   75 Text Interpretation:  Sinus rhythm Consider left atrial enlargement Borderline prolonged QT interval Since last tracing QT has lengthened Confirmed by Daleen Bo [phone]) on 12/19/2016 6:19:58 AM       Radiology Dg Chest 2 View  Result Date: 12/19/2016 CLINICAL DATA:  Hypotension and dyspnea of 1 day duration EXAM: CHEST  2 VIEW COMPARISON:  03/04/2016 FINDINGS: Marked thoracolumbar scoliosis. Mild unchanged cardiomegaly. No airspace consolidation. No pleural effusion. Normal pulmonary vasculature. Stable linear scarring in the midleft lung. IMPRESSION: Mild cardiomegaly, unchanged. Thoracolumbar scoliosis. No consolidation or effusion. Electronically Signed   By: Andreas Newport M.D.   On: 12/19/2016 06:07    Procedures Procedures (including critical care time)  CRITICAL CARE Performed by: Monico Blitz   Total critical care time: 45 minutes  Critical care time was exclusive of separately billable procedures and treating other patients.  Critical care was necessary to treat or prevent imminent or life-threatening  deterioration.  Critical care was time spent personally by me on the following activities: development of treatment plan with patient and/or surrogate as well as nursing, discussions with consultants, evaluation of patient's response to treatment, examination of patient, obtaining history from patient or surrogate, ordering and performing treatments and interventions, ordering and review of laboratory studies, ordering and review of radiographic studies, pulse oximetry and re-evaluation of patient's condition.   Medications Ordered in ED Medications  sodium chloride 0.9 % bolus 1,000 mL (1,000 mLs Intravenous New Bag/Given 12/19/16 0609)  sodium chloride 0.9 % bolus 500 mL (500 mLs Intravenous New Bag/Given 12/19/16 0609)  HYDROcodone-acetaminophen (NORCO/VICODIN) 5-325 MG per tablet 1 tablet (1 tablet Oral Given 12/19/16 0615)  ondansetron (ZOFRAN-ODT) disintegrating tablet 4 mg (4 mg Oral Given 12/19/16 0615)  potassium chloride SA (K-DUR,KLOR-CON) CR tablet 40 mEq (40 mEq Oral Given 12/19/16 0658)  aspirin chewable tablet 324 mg (324 mg Oral Given 12/19/16 0659)     Initial Impression / Assessment and Plan / ED Course  I have reviewed the triage vital signs and the nursing notes.  Pertinent labs & imaging results that were available during my care of the patient were reviewed by me and considered in my medical decision making (see chart for details).     Vitals:   12/19/16 0021 12/19/16 0542 12/19/16 0700  BP: (!) 90/58 (!) 93/58 96/64  Pulse: 62 62 77  Resp: '18 16 13  '$ Temp: 98.4 F (36.9 C)    TempSrc: Oral    SpO2: 97% 97% 99%    Medications  sodium chloride 0.9 % bolus 1,000 mL (1,000 mLs Intravenous New Bag/Given 12/19/16 0609)  sodium chloride 0.9 % bolus 500 mL (500 mLs Intravenous New Bag/Given 12/19/16 0609)  HYDROcodone-acetaminophen (NORCO/VICODIN) 5-325 MG per tablet 1 tablet (1 tablet Oral Given 12/19/16 0615)  ondansetron (ZOFRAN-ODT) disintegrating tablet 4 mg (4 mg Oral Given  12/19/16 0615)  potassium chloride SA (K-DUR,KLOR-CON) CR tablet 40 mEq (40 mEq Oral Given 12/19/16 0658)  aspirin chewable tablet 324 mg (324 mg Oral Given 12/19/16 0659)    [name] is 61 y.o. female presenting with  Hypotension, generalized fatigue and weakness worsening over the course of a week. She's not been eating and drinking normally. She also had diarrhea. She was recently hospitalized in [place] for fall. She has intermittent chest pain which is worsening in severity and frequency over the last several days. She has a history of DVT in the remote past but is not anticoagulated. She was seen by primary care and advised to DC blood pressure medication secondary to soft blood pressure. Blood pressure is soft in the ED with systolic in the 31S. Given her severe shortness of breath intermittent sharp chest pain and recent travel consider PE. Unfortunately, this patient has had elevated creatinine and is not a candidate for angiogram. Dimer and VQ ordered. Mildly depleted potassium, likely secondary to her diarrhea.  EKG with no acute findings, borderline prolonged QT increased from prior tracing. Troponin is elevated at 0.08. Patient does aspirin, doubt this is dissection.  Discussed with hospitalist who accepts admission.   Final Clinical Impressions(s) / ED Diagnoses   Final diagnoses:  Elevated troponin  Blood pressure decreased  Weakness  Atypical chest pain    New Prescriptions New Prescriptions   No medications on file     [name] 12/19/16 1444    Eiko Mcgowen, Elmyra Ricks, PA-C 12/19/16 0720    Daleen Bo, MD 12/19/16 [phone]

## 2016-12-20 DIAGNOSIS — I9589 Other hypotension: Secondary | ICD-10-CM | POA: Diagnosis not present

## 2016-12-20 DIAGNOSIS — N179 Acute kidney failure, unspecified: Secondary | ICD-10-CM | POA: Diagnosis not present

## 2016-12-20 DIAGNOSIS — R748 Abnormal levels of other serum enzymes: Secondary | ICD-10-CM | POA: Diagnosis not present

## 2016-12-20 DIAGNOSIS — G8929 Other chronic pain: Secondary | ICD-10-CM | POA: Diagnosis not present

## 2016-12-20 DIAGNOSIS — I10 Essential (primary) hypertension: Secondary | ICD-10-CM | POA: Diagnosis not present

## 2016-12-20 DIAGNOSIS — K529 Noninfective gastroenteritis and colitis, unspecified: Secondary | ICD-10-CM

## 2016-12-20 DIAGNOSIS — R42 Dizziness and giddiness: Secondary | ICD-10-CM | POA: Diagnosis not present

## 2016-12-20 DIAGNOSIS — E876 Hypokalemia: Secondary | ICD-10-CM | POA: Diagnosis not present

## 2016-12-20 DIAGNOSIS — R031 Nonspecific low blood-pressure reading: Secondary | ICD-10-CM | POA: Diagnosis not present

## 2016-12-20 DIAGNOSIS — E1142 Type 2 diabetes mellitus with diabetic polyneuropathy: Secondary | ICD-10-CM | POA: Diagnosis not present

## 2016-12-20 DIAGNOSIS — E860 Dehydration: Secondary | ICD-10-CM | POA: Diagnosis not present

## 2016-12-20 DIAGNOSIS — R531 Weakness: Secondary | ICD-10-CM | POA: Diagnosis not present

## 2016-12-20 DIAGNOSIS — R0789 Other chest pain: Secondary | ICD-10-CM

## 2016-12-20 DIAGNOSIS — M542 Cervicalgia: Secondary | ICD-10-CM | POA: Diagnosis not present

## 2016-12-20 LAB — COMPREHENSIVE METABOLIC PANEL
ALBUMIN: 3 g/dL — AB (ref 3.5–5.0)
ALK PHOS: 112 U/L (ref 38–126)
ALT: 41 U/L (ref 14–54)
ALT: 41 U/L (ref 14–54)
AST: 18 U/L (ref 15–41)
AST: 19 U/L (ref 15–41)
Albumin: 2.9 g/dL — ABNORMAL LOW (ref 3.5–5.0)
Alkaline Phosphatase: 101 U/L (ref 38–126)
Anion gap: 5 (ref 5–15)
Anion gap: 5 (ref 5–15)
BUN: 30 mg/dL — ABNORMAL HIGH (ref 6–20)
BUN: 24 mg/dL — ABNORMAL HIGH (ref 6–20)
CALCIUM: 7.6 mg/dL — AB (ref 8.9–10.3)
CO2: 21 mmol/L — ABNORMAL LOW (ref 22–32)
CO2: 20 mmol/L — AB (ref 22–32)
CREATININE: 1.33 mg/dL — AB (ref 0.44–1.00)
Calcium: 7.3 mg/dL — ABNORMAL LOW (ref 8.9–10.3)
Chloride: 114 mmol/L — ABNORMAL HIGH (ref 101–111)
Chloride: 114 mmol/L — ABNORMAL HIGH (ref 101–111)
Creatinine, Ser: 1.82 mg/dL — ABNORMAL HIGH (ref 0.44–1.00)
GFR calc Af Amer: 33 mL/min — ABNORMAL LOW (ref >60)
GFR calc Af Amer: 48 mL/min — ABNORMAL LOW (ref >60)
GFR calc non Af Amer: 28 mL/min — ABNORMAL LOW (ref >60)
GFR calc non Af Amer: 42 mL/min — ABNORMAL LOW (ref >60)
GLUCOSE: 101 mg/dL — AB (ref 65–99)
Glucose, Bld: 98 mg/dL (ref 65–99)
Potassium: 4.1 mmol/L (ref 3.5–5.1)
Potassium: 3.9 mmol/L (ref 3.5–5.1)
SODIUM: 139 mmol/L (ref 135–145)
Sodium: 140 mmol/L (ref 135–145)
Total Bilirubin: 0.5 mg/dL (ref 0.3–1.2)
Total Bilirubin: 0.1 mg/dL — ABNORMAL LOW (ref 0.3–1.2)
Total Protein: 5.1 g/dL — ABNORMAL LOW (ref 6.5–8.1)
Total Protein: 5.5 g/dL — ABNORMAL LOW (ref 6.5–8.1)

## 2016-12-20 LAB — PROTEIN / CREATININE RATIO, URINE
Creatinine, Urine: 197.52 mg/dL
Protein Creatinine Ratio: 0.25 mg/mg{Cre} — ABNORMAL HIGH (ref 0.00–0.15)
Total Protein, Urine: 50 mg/dL

## 2016-12-20 LAB — GLUCOSE, CAPILLARY
GLUCOSE-CAPILLARY: 100 mg/dL — AB (ref 65–99)
Glucose-Capillary: 114 mg/dL — ABNORMAL HIGH (ref 65–99)

## 2016-12-20 MED ORDER — SACCHAROMYCES BOULARDII 250 MG PO CAPS
250.0000 mg | ORAL_CAPSULE | Freq: Two times a day (BID) | ORAL | Status: DC
  Administered 2016-12-20 – 2016-12-21 (×2): 250 mg via ORAL
  Filled 2016-12-20 (×2): qty 1

## 2016-12-20 MED ORDER — INSULIN ASPART 100 UNIT/ML ~~LOC~~ SOLN: 0.0000 [IU] | Freq: Every day | SUBCUTANEOUS | Status: DC

## 2016-12-20 MED ORDER — INSULIN ASPART 100 UNIT/ML ~~LOC~~ SOLN: 0.0000 [IU] | Freq: Three times a day (TID) | SUBCUTANEOUS | Status: DC

## 2016-12-20 MED ORDER — ENOXAPARIN SODIUM 40 MG/0.4ML ~~LOC~~ SOLN
40.0000 mg | SUBCUTANEOUS | Status: DC
  Administered 2016-12-20: 40 mg via SUBCUTANEOUS
  Filled 2016-12-20: qty 0.4

## 2016-12-20 MED ORDER — SENNA 8.6 MG PO TABS
1.0000 | ORAL_TABLET | Freq: Every day | ORAL | Status: DC | PRN
  Administered 2016-12-20: 8.6 mg via ORAL
  Filled 2016-12-20: qty 1

## 2016-12-20 MED ORDER — BISACODYL 5 MG PO TBEC
5.0000 mg | DELAYED_RELEASE_TABLET | Freq: Every day | ORAL | Status: DC | PRN
  Administered 2016-12-20: 5 mg via ORAL
  Filled 2016-12-20: qty 1

## 2016-12-20 NOTE — Care Management Note (Signed)
Case Management Note  Patient Details  Name: [name] MRN: [id number] Date of Birth: [birth year]  Subjective/Objective:    hypotension                Action/Plan: Date:  December 20, 2016 Chart reviewed for concurrent status and case management needs. Will continue to follow patient progress. Discharge Planning: following for needs Expected discharge date: [id number] Velva Harman, BSN, [place], [place]  Expected Discharge Date:                  Expected Discharge Plan:  Home/Self Care  In-House Referral:     Discharge planning Services  CM Consult  Post Acute Care Choice:    Choice offered to:     DME Arranged:    DME Agency:     HH Arranged:    HH Agency:     Status of Service:  In process, will continue to follow  If discussed at Long Length of Stay Meetings, dates discussed:    Additional Comments:  Leeroy Cha, RN 12/20/2016, 8:40 AM

## 2016-12-20 NOTE — Progress Notes (Signed)
PROGRESS NOTE    [name]  LGX:[id number] DOB: 03/13/1956 DOA: 12/19/2016 PCP: Hoyt Koch, MD    Brief Narrative:  61 y.o. female with medical history significant of htn, dm, chf, dvt not anticoagulated, presents with one week of light-headedness. Symptoms began about a week ago. Experienced lots of nausea, vomiting, and diarrhea, which have improved. Was visiting family in [place] when fell and was hhospitalized, though she is unsure what she was diagnosed with, saying it she thought they mainly treated her chronic neck pain. Since discharge she returned to her home here and has continued to fee light-headed.she checks her bp at home and says it has been low, as low as 81K systolic. Her by mouth intake has remained low and she says she has not been urinating much. Her nausea and vomiting have improved significantly however. She says she sometimes gets chest pressure when she lays down. No shortness of breath. nocurrent chest or other pain save for chronic pain in neck. No fevers. No dysuria.   Assessment & Plan:   Active Problems:   Acute kidney injury ([place])   Gastroenteritis   Hypotension  # Gastroenteritis - one week nausea, vomiting, diarrhea, now much improved - zofran prn, IV fluids - Pt not yet reliably tolerating diet  # Acute kidney injury - cr 3.86 today, in the 2s last week at PCPs, from baseline of 1. Likely prerenal from dehydration - cr much improved with IVF hydration --Repeat bmet in AM  #Hypotension - think 2/2 to dehydration from acute gastroenteritis. Alert and oriented. - Fluid responsive -BP much improved with IVF hydration overngiht  #Hypokalemia - 3.1, mild, likely 2/2 vomiting/diarrhea,now resolved. - corrected - Repeat bmet in AM  #DM - glucose wnl on admission - random glucose stable - Will order SSI coverage  #HTN - here hypotensive.  - BP much improved with IVF hydration  #chronic pain, depression - given aki decreased  tramadol frequencyfrom q6 to 12 and gabapentin dose from 400 q6 to 100 q6. Held wellbutrin, continue escitalopram at time of presentation  DVT prophylaxis: Lovenox subQ Code Status: Full Family Communication: Pt in room, family not at bedside Disposition Plan: Likely d/c home 9/4  Consultants:     Procedures:     Antimicrobials: Anti-infectives    None       Subjective: Feels much better, however still not tolerating PO diet this afternoon  Objective: Vitals:   12/20/16 0707 12/20/16 0800 12/20/16 1155 12/20/16 1200  BP: (!) 97/53  (!) 125/59   Pulse: (!) 52  66   Resp: 13  12   Temp:  98 F (36.7 C)  97.8 F (36.6 C)  TempSrc:  Oral  Oral  SpO2: 99%  97%   Weight:      Height:        Intake/Output Summary (Last 24 hours) at 12/20/16 1453 Last data filed at 12/20/16 0500  Gross per 24 hour  Intake          2123.34 ml  Output             1000 ml  Net          1123.34 ml   Filed Weights   12/19/16 1500  Weight: 105.7 kg (233 lb 0.4 oz)    Examination:  General exam: Appears calm and comfortable  Respiratory system: Clear to auscultation. Respiratory effort normal. Cardiovascular system: S1 & S2 heard, RRR. Gastrointestinal system: Abdomen is nondistended, soft and nontender. No organomegaly or  masses felt. Normal bowel sounds heard. Central nervous system: Alert and oriented. No focal neurological deficits. Extremities: Symmetric 5 x 5 power. Skin: No rashes, lesions Psychiatry: Judgement and insight appear normal. Mood & affect appropriate.   Data Reviewed: I have personally reviewed following labs and imaging studies  CBC:  Recent Labs Lab 12/17/16 1205 12/19/16 0434  WBC 8.4 8.9  NEUTROABS 5.5 5.1  HGB 14.5 12.9  HCT 44.9 38.9  MCV 82.8 81.2  PLT 210.0 644   Basic Metabolic Panel:  Recent Labs Lab 12/19/16 0434 12/19/16 0444 12/19/16 1207 12/19/16 1823 12/20/16 0307 12/20/16 1239  NA 132*  --  136 141 140 139  K 3.1*  --  3.3*  3.5 4.1 3.9  CL 100*  --  108 110 114* 114*  CO2 21*  --  22 20* 21* 20*  GLUCOSE 102*  --  119* 130* 98 101*  BUN 40*  --  36* 33* 30* 24*  CREATININE 3.86*  --  2.88* 2.46* 1.82* 1.33*  CALCIUM 7.9*  --  7.4* 7.9* 7.3* 7.6*  MG  --  2.0  --   --   --   --    GFR: Estimated Creatinine Clearance: 52.3 mL/min (A) (by C-G formula based on SCr of 1.33 mg/dL (H)). Liver Function Tests:  Recent Labs Lab 12/17/16 1205 12/19/16 1207 12/19/16 1823 12/20/16 0307 12/20/16 1239  AST 34 22 25 18 19   ALT 80* 55* 55* 41 41  ALKPHOS 151* 120 127* 101 112  BILITOT 0.5 0.4 0.3 0.5 0.1*  PROT 6.3 5.9* 6.5 5.1* 5.5*  ALBUMIN 3.6 3.3* 3.4* 2.9* 3.0*    Recent Labs Lab 12/17/16 1205  LIPASE 29.0  AMYLASE 53   No results for input(s): AMMONIA in the last 168 hours. Coagulation Profile: No results for input(s): INR, PROTIME in the last 168 hours. Cardiac Enzymes:  Recent Labs Lab 12/19/16 0444 12/19/16 1207  TROPONINI 0.08* 0.08*   BNP (last 3 results)  Recent Labs  03/04/16 1457  PROBNP 58.0   HbA1C: No results for input(s): HGBA1C in the last 72 hours. CBG: No results for input(s): GLUCAP in the last 168 hours. Lipid Profile: No results for input(s): CHOL, HDL, LDLCALC, TRIG, CHOLHDL, LDLDIRECT in the last 72 hours. Thyroid Function Tests: No results for input(s): TSH, T4TOTAL, FREET4, T3FREE, THYROIDAB in the last 72 hours. Anemia Panel: No results for input(s): VITAMINB12, FOLATE, FERRITIN, TIBC, IRON, RETICCTPCT in the last 72 hours. Sepsis Labs: No results for input(s): PROCALCITON, LATICACIDVEN in the last 168 hours.  Recent Results (from the past 240 hour(s))  MRSA PCR Screening     Status: None   Collection Time: 12/19/16  3:41 PM  Result Value Ref Range Status   MRSA by PCR NEGATIVE NEGATIVE Final    Comment:        The GeneXpert MRSA Assay (FDA approved for NASAL specimens only), is one component of a comprehensive MRSA colonization surveillance program.  It is not intended to diagnose MRSA infection nor to guide or monitor treatment for MRSA infections.      Radiology Studies: Dg Chest 2 View  Result Date: 12/19/2016 CLINICAL DATA:  Hypotension and dyspnea of 1 day duration EXAM: CHEST  2 VIEW COMPARISON:  03/04/2016 FINDINGS: Marked thoracolumbar scoliosis. Mild unchanged cardiomegaly. No airspace consolidation. No pleural effusion. Normal pulmonary vasculature. Stable linear scarring in the midleft lung. IMPRESSION: Mild cardiomegaly, unchanged. Thoracolumbar scoliosis. No consolidation or effusion. Electronically Signed   By: Andreas Newport  M.D.   On: 12/19/2016 06:07    Scheduled Meds: . enoxaparin (LOVENOX) injection  40 mg Subcutaneous Q24H  . escitalopram  10 mg Oral Daily  . gabapentin  100 mg Oral QID  . pantoprazole  40 mg Oral QAC breakfast  . traMADol  50 mg Oral Q12H   Continuous Infusions: . dextrose 5 % and 0.45 % NaCl with KCl 20 mEq/L 125 mL/hr at 12/20/16 1432     LOS: 1 day   Maysen Bonsignore, Orpah Melter, MD [facility] Pager [phone]  If 7PM-7AM, please contact night-coverage [email or web address] Password [email or web address] 12/20/2016, 2:53 PM

## 2016-12-21 DIAGNOSIS — E876 Hypokalemia: Secondary | ICD-10-CM | POA: Diagnosis not present

## 2016-12-21 DIAGNOSIS — R531 Weakness: Secondary | ICD-10-CM | POA: Diagnosis not present

## 2016-12-21 DIAGNOSIS — N179 Acute kidney failure, unspecified: Secondary | ICD-10-CM | POA: Diagnosis not present

## 2016-12-21 DIAGNOSIS — I9589 Other hypotension: Secondary | ICD-10-CM | POA: Diagnosis not present

## 2016-12-21 DIAGNOSIS — K529 Noninfective gastroenteritis and colitis, unspecified: Secondary | ICD-10-CM | POA: Diagnosis not present

## 2016-12-21 DIAGNOSIS — M542 Cervicalgia: Secondary | ICD-10-CM | POA: Diagnosis not present

## 2016-12-21 DIAGNOSIS — R031 Nonspecific low blood-pressure reading: Secondary | ICD-10-CM

## 2016-12-21 DIAGNOSIS — E1142 Type 2 diabetes mellitus with diabetic polyneuropathy: Secondary | ICD-10-CM | POA: Diagnosis not present

## 2016-12-21 DIAGNOSIS — R748 Abnormal levels of other serum enzymes: Secondary | ICD-10-CM | POA: Diagnosis not present

## 2016-12-21 DIAGNOSIS — E860 Dehydration: Secondary | ICD-10-CM | POA: Diagnosis not present

## 2016-12-21 DIAGNOSIS — G8929 Other chronic pain: Secondary | ICD-10-CM | POA: Diagnosis not present

## 2016-12-21 DIAGNOSIS — R0789 Other chest pain: Secondary | ICD-10-CM | POA: Diagnosis not present

## 2016-12-21 DIAGNOSIS — I10 Essential (primary) hypertension: Secondary | ICD-10-CM | POA: Diagnosis not present

## 2016-12-21 DIAGNOSIS — R42 Dizziness and giddiness: Secondary | ICD-10-CM | POA: Diagnosis not present

## 2016-12-21 LAB — GLUCOSE, CAPILLARY
GLUCOSE-CAPILLARY: 93 mg/dL (ref 65–99)
GLUCOSE-CAPILLARY: 102 mg/dL — AB (ref 65–99)

## 2016-12-21 LAB — HIV ANTIBODY (ROUTINE TESTING W REFLEX): HIV SCREEN 4TH GENERATION: NONREACTIVE

## 2016-12-21 MED ORDER — PROMETHAZINE HCL 12.5 MG PO TABS: 12.5000 mg | ORAL_TABLET | Freq: Four times a day (QID) | ORAL | 0 refills | Status: DC | PRN

## 2016-12-21 MED ORDER — SACCHAROMYCES BOULARDII 250 MG PO CAPS: 250.0000 mg | ORAL_CAPSULE | Freq: Two times a day (BID) | ORAL | 0 refills | Status: DC

## 2016-12-21 NOTE — Discharge Summary (Signed)
Physician Discharge Summary  LEVY WELLMAN ZSM:[id number] DOB: May 27, 1955 DOA: 12/19/2016  PCP: Hoyt Koch, MD  Admit date: 12/19/2016 Discharge date: 12/21/2016  Admitted From: Home Disposition:  Home  Recommendations for Outpatient Follow-up:  1. Follow up with PCP in 1-2 weeks 2. Please monitor pt's blood pressure. Pt was recommended to hold all BP meds secondary to soft blood pressure. Please gradually resume blood pressure meds over time as pt tolerates 3. Recommend repeat renal panel in 1-2 weeks to ensure resolution of acute renal failure.  Discharge Condition:Improved CODE STATUS:Full Diet recommendation: Soft, advance as tolerated   Brief/Interim Summary: 61 y.o.femalewith medical history significant of htn, dm, chf, dvt not anticoagulated, presents with one week of light-headedness. Symptoms began about a week ago. Experienced lots of nausea, vomiting, and diarrhea, which have improved. Was visiting family in [place] when fell and was hhospitalized, though she is unsure what she was diagnosed with, saying it she thought they mainly treated her chronic neck pain. Since discharge she returned to her home here and has continued to fee light-headed.she checks her bp at home and says it has been low, as low as 49E systolic. Her by mouth intake has remained low and she says she has not been urinating much. Her nausea and vomiting have improved significantly however. She says she sometimes gets chest pressure when she lays down. No shortness of breath. nocurrent chest or other pain save for chronic pain in neck. No fevers. No dysuria.   # Gastroenteritis - one week nausea, vomiting, diarrhea, now much improved - Tolerating diet with antiemetics -recommend soft diet and advance as tolerated  # Acute kidney injury - cr 3.86 today, in the 2s last week at PCPs, from baseline of 1. Likely prerenal from dehydration - cr much improved with IVF hydration  #Hypotension - think  2/2 to dehydration from acute gastroenteritis. Alert and oriented. - Fluid responsive -BP much improved with IVF hydration -BP remains soft at time of discharge. Recommend close outpatient f/u and gradually resume BP meds when able to tolerate  #Hypokalemia - 3.1, mild, likely 2/2 vomiting/diarrhea,now resolved. - corrected  #DM - glucose wnl on admission - random glucose stable - Patient was continued on SSI coverage while admitted  #HTN - Presented markedly hypotensive.  - BP much improved with IVF hydration  #chronic pain, depression - when admitted, decreased tramadol frequencyfrom q6 to 12 and gabapentin dose from 400 q6 to 100 q6. Held wellbutrin, continued escitalopram at time of presentation -resume home meds on discharge  Discharge Diagnoses:  Active Problems:   Acute kidney injury ([place])   Gastroenteritis   Hypotension    Discharge Instructions   Allergies as of 12/21/2016      Reactions   Carafate [sucralfate] Hives   Other reaction(s): Angioedema (ALLERGY/intolerance) hives scratching   Sulfonamide Derivatives Rash   REACTION: Syncope      Medication List    STOP taking these medications   doxycycline 100 MG capsule Commonly known as:  MONODOX   furosemide 40 MG tablet Commonly known as:  LASIX   HYDROcodone-homatropine 5-1.5 MG/5ML syrup Commonly known as:  HYCODAN   metoCLOPramide 5 MG tablet Commonly known as:  REGLAN   metoprolol tartrate 25 MG tablet Commonly known as:  LOPRESSOR   polyethylene glycol packet Commonly known as:  MIRALAX / GLYCOLAX   potassium chloride SA 20 MEQ tablet Commonly known as:  K-DUR,KLOR-CON   predniSONE 20 MG tablet Commonly known as:  DELTASONE  TAKE these medications   ACCU-CHEK AVIVA PLUS w/Device Kit Use as directed to check blood sugars daily Dx E11.9   ACCU-CHEK AVIVA Soln Use as directed   ACCU-CHEK SOFTCLIX LANCETS lancets 1 each by Other route 2 (two) times daily. Use to help check  blood sugars twice a day Dx E11.9   albuterol 108 (90 Base) MCG/ACT inhaler Commonly known as:  PROVENTIL HFA;VENTOLIN HFA Inhale 1-2 puffs into the lungs every 6 (six) hours as needed for wheezing or shortness of breath.   Alcohol Swabs 70 % Pads Use to wipe area to check blood sugars daily Dx E11.9   aspirin EC 81 MG tablet Take 81 mg by mouth daily.   BLACK COHOSH PO Take 1 tablet by mouth daily.   buPROPion 150 MG 12 hr tablet Commonly known as:  WELLBUTRIN SR Take 1 tablet (150 mg total) by mouth 2 (two) times daily.   CALCIUM PO Take 1 tablet by mouth daily. 639m   escitalopram 10 MG tablet Commonly known as:  LEXAPRO Take 1 tablet (10 mg total) by mouth daily.   gabapentin 400 MG capsule Commonly known as:  NEURONTIN TAKE 1 CAPSULE FOUR TIMES DAILY   glucose blood test strip Commonly known as:  ACCU-CHEK AVIVA PLUS 1 each by Other route 2 (two) times daily. Use to check blood sugars twice a day Dx E11.9   multivitamins ther. w/minerals Tabs tablet Take 1 tablet by mouth daily.   nitroGLYCERIN 0.4 MG SL tablet Commonly known as:  NITROSTAT Place 0.4 mg under the tongue every 5 (five) minutes as needed for chest pain.   ondansetron 4 MG tablet Commonly known as:  ZOFRAN Take 1 tablet (4 mg total) by mouth every 8 (eight) hours as needed for nausea or vomiting.   pantoprazole 40 MG tablet Commonly known as:  PROTONIX Take 1 tablet (40 mg total) by mouth daily before breakfast.   promethazine 12.5 MG tablet Commonly known as:  PHENERGAN Take 1 tablet (12.5 mg total) by mouth every 6 (six) hours as needed for nausea or vomiting.   saccharomyces boulardii 250 MG capsule Commonly known as:  FLORASTOR Take 1 capsule (250 mg total) by mouth 2 (two) times daily.   traMADol 50 MG tablet Commonly known as:  ULTRAM Take 1 tablet (50 mg total) by mouth 4 (four) times daily.   vitamin C 500 MG tablet Commonly known as:  ASCORBIC ACID Take 500 mg by mouth  daily.   VITAMIN D (CHOLECALCIFEROL) PO Take 1,200 Units by mouth daily.            Discharge Care Instructions        Start     Ordered   12/21/16 0000  saccharomyces boulardii (FLORASTOR) 250 MG capsule  2 times daily     12/21/16 1207   12/21/16 0000  promethazine (PHENERGAN) 12.5 MG tablet  Every 6 hours PRN     12/21/16 1207     Follow-up Information    CHoyt Koch MD. Schedule an appointment as soon as possible for a visit in 1 week(s).   Specialty:  Internal Medicine Contact information: [phone]         Allergies  Allergen Reactions  . Carafate [Sucralfate] Hives    Other reaction(s): Angioedema (ALLERGY/intolerance) hives scratching  . Sulfonamide Derivatives Rash    REACTION: Syncope    Procedures/Studies: Dg Chest 2 View  Result Date: 12/19/2016 CLINICAL DATA:  Hypotension and  dyspnea of 1 day duration EXAM: CHEST  2 VIEW COMPARISON:  03/04/2016 FINDINGS: Marked thoracolumbar scoliosis. Mild unchanged cardiomegaly. No airspace consolidation. No pleural effusion. Normal pulmonary vasculature. Stable linear scarring in the midleft lung. IMPRESSION: Mild cardiomegaly, unchanged. Thoracolumbar scoliosis. No consolidation or effusion. Electronically Signed   By: Andreas Newport M.D.   On: 12/19/2016 06:07     Subjective: Feels better today  Discharge Exam: Vitals:   12/20/16 2206 12/21/16 0551  BP: (!) 106/42 (!) 97/49  Pulse: 75 67  Resp: 18 18  Temp: 98.3 F (36.8 C) 98.7 F (37.1 C)  SpO2: 98% 99%   Vitals:   12/20/16 1200 12/20/16 1613 12/20/16 2206 12/21/16 0551  BP:  (!) 101/55 (!) 106/42 (!) 97/49  Pulse:  72 75 67  Resp:  _0 Temp: 97.8 F (36.6 C) 98.7 F (37.1 C) 98.3 F (36.8 C) 98.7 F (37.1 C)  TempSrc: Oral Oral Oral Oral  SpO2:  99% 98% 99%  Weight:      Height:        General: Pt is alert, awake, not in acute distress Cardiovascular: RRR, S1/S2 +, no  rubs, no gallops Respiratory: CTA bilaterally, no wheezing, no rhonchi Abdominal: Soft, NT, ND, bowel sounds + Extremities: no edema, no cyanosis   The results of significant diagnostics from this hospitalization (including imaging, microbiology, ancillary and laboratory) are listed below for reference.     Microbiology: Recent Results (from the past 240 hour(s))  MRSA PCR Screening     Status: None   Collection Time: 12/19/16  3:41 PM  Result Value Ref Range Status   MRSA by PCR NEGATIVE NEGATIVE Final    Comment:        The GeneXpert MRSA Assay (FDA approved for NASAL specimens only), is one component of a comprehensive MRSA colonization surveillance program. It is not intended to diagnose MRSA infection nor to guide or monitor treatment for MRSA infections.      Labs: BNP (last 3 results)  Recent Labs  12/19/16 0440  BNP 33.2   Basic Metabolic Panel:  Recent Labs Lab 12/19/16 0434 12/19/16 0444 12/19/16 1207 12/19/16 1823 12/20/16 0307 12/20/16 1239  NA 132*  --  136 141 140 139  K 3.1*  --  3.3* 3.5 4.1 3.9  CL 100*  --  108 110 114* 114*  CO2 21*  --  22 20* 21* 20*  GLUCOSE 102*  --  119* 130* 98 101*  BUN 40*  --  36* 33* 30* 24*  CREATININE 3.86*  --  2.88* 2.46* 1.82* 1.33*  CALCIUM 7.9*  --  7.4* 7.9* 7.3* 7.6*  MG  --  2.0  --   --   --   --    Liver Function Tests:  Recent Labs Lab 12/17/16 1205 12/19/16 1207 12/19/16 1823 12/20/16 0307 12/20/16 1239  AST 34 _1 ALT 80* 55* 55* 41 41  ALKPHOS 151* 120 127* 101 112  BILITOT 0.5 0.4 0.3 0.5 0.1*  PROT 6.3 5.9* 6.5 5.1* 5.5*  ALBUMIN 3.6 3.3* 3.4* 2.9* 3.0*    Recent Labs Lab 12/17/16 1205  LIPASE 29.0  AMYLASE 53   No results for input(s): AMMONIA in the last 168 hours. CBC:  Recent Labs Lab 12/17/16 1205 12/19/16 0434  WBC 8.4 8.9  NEUTROABS 5.5 5.1  HGB 14.5 12.9  HCT 44.9 38.9  MCV 82.8 81.2  PLT 210.0 181   Cardiac Enzymes:  Recent Labs  Lab  12/19/16 0444 12/19/16 1207  TROPONINI 0.08* 0.08*   BNP: Invalid input(s): POCBNP CBG:  Recent Labs Lab 12/20/16 1700 12/20/16 2048 12/21/16 0716 12/21/16 1154  GLUCAP 100* 114* 93 102*   D-Dimer  Recent Labs  12/19/16 0440  DDIMER 0.36   Hgb A1c No results for input(s): HGBA1C in the last 72 hours. Lipid Profile No results for input(s): CHOL, HDL, LDLCALC, TRIG, CHOLHDL, LDLDIRECT in the last 72 hours. Thyroid function studies No results for input(s): TSH, T4TOTAL, T3FREE, THYROIDAB in the last 72 hours.  Invalid input(s): FREET3 Anemia work up No results for input(s): VITAMINB12, FOLATE, FERRITIN, TIBC, IRON, RETICCTPCT in the last 72 hours. Urinalysis    Component Value Date/Time   COLORURINE YELLOW 12/19/2016 0950   APPEARANCEUR HAZY (A) 12/19/2016 0950   LABSPEC 1.014 12/19/2016 0950   PHURINE 5.0 12/19/2016 0950   GLUCOSEU NEGATIVE 12/19/2016 0950   GLUCOSEU NEGATIVE 12/11/2015 1542   HGBUR NEGATIVE 12/19/2016 0950   HGBUR trace-intact 07/25/2007 1629   BILIRUBINUR NEGATIVE 12/19/2016 0950   BILIRUBINUR negative 07/09/2016 1338   KETONESUR NEGATIVE 12/19/2016 0950   PROTEINUR 30 (A) 12/19/2016 0950   UROBILINOGEN negative 07/09/2016 1338   UROBILINOGEN 0.2 12/11/2015 1542   NITRITE NEGATIVE 12/19/2016 0950   LEUKOCYTESUR LARGE (A) 12/19/2016 0950   Sepsis Labs Invalid input(s): PROCALCITONIN,  WBC,  LACTICIDVEN Microbiology Recent Results (from the past 240 hour(s))  MRSA PCR Screening     Status: None   Collection Time: 12/19/16  3:41 PM  Result Value Ref Range Status   MRSA by PCR NEGATIVE NEGATIVE Final    Comment:        The GeneXpert MRSA Assay (FDA approved for NASAL specimens only), is one component of a comprehensive MRSA colonization surveillance program. It is not intended to diagnose MRSA infection nor to guide or monitor treatment for MRSA infections.      SIGNED:   Donne Hazel, MD  [facility] 12/21/2016,  12:12 PM  If 7PM-7AM, please contact night-coverage [email or web address] Password [email or web address]

## 2016-12-21 NOTE — Progress Notes (Signed)
Date: December 21, 2016 Chart reviewed for discharge orders: None found for case management. Rhonda Davis,BSN,RN3, CCM/[phone] 

## 2016-12-23 ENCOUNTER — Other Ambulatory Visit: Payer: Self-pay | Admitting: Internal Medicine

## 2016-12-23 NOTE — Telephone Encounter (Signed)
Please advise thanks.

## 2016-12-29 ENCOUNTER — Other Ambulatory Visit: Payer: Self-pay | Admitting: Internal Medicine

## 2016-12-29 ENCOUNTER — Ambulatory Visit (INDEPENDENT_AMBULATORY_CARE_PROVIDER_SITE_OTHER): Payer: Medicare HMO | Admitting: Internal Medicine

## 2016-12-29 ENCOUNTER — Encounter: Payer: Self-pay | Admitting: Internal Medicine

## 2016-12-29 ENCOUNTER — Other Ambulatory Visit (INDEPENDENT_AMBULATORY_CARE_PROVIDER_SITE_OTHER): Payer: Medicare HMO

## 2016-12-29 VITALS — BP 136/80 | HR 66 | Temp 98.8°F | Ht 65.0 in | Wt 245.0 lb

## 2016-12-29 DIAGNOSIS — R531 Weakness: Secondary | ICD-10-CM

## 2016-12-29 DIAGNOSIS — I10 Essential (primary) hypertension: Secondary | ICD-10-CM | POA: Diagnosis not present

## 2016-12-29 DIAGNOSIS — M25561 Pain in right knee: Secondary | ICD-10-CM

## 2016-12-29 DIAGNOSIS — N179 Acute kidney failure, unspecified: Secondary | ICD-10-CM

## 2016-12-29 DIAGNOSIS — R300 Dysuria: Secondary | ICD-10-CM | POA: Diagnosis not present

## 2016-12-29 LAB — URINALYSIS, ROUTINE W REFLEX MICROSCOPIC
BILIRUBIN URINE: NEGATIVE
HGB URINE DIPSTICK: NEGATIVE
KETONES UR: NEGATIVE
Nitrite: POSITIVE — AB
Specific Gravity, Urine: 1.025 (ref 1.000–1.030)
Urine Glucose: NEGATIVE
Urobilinogen, UA: 0.2 (ref 0.0–1.0)
pH: 6 (ref 5.0–8.0)

## 2016-12-29 LAB — COMPREHENSIVE METABOLIC PANEL
ALT: 18 U/L (ref 0–35)
AST: 20 U/L (ref 0–37)
Albumin: 3.5 g/dL (ref 3.5–5.2)
Alkaline Phosphatase: 120 U/L — ABNORMAL HIGH (ref 39–117)
BUN: 17 mg/dL (ref 6–23)
CHLORIDE: 113 meq/L — AB (ref 96–112)
CO2: 21 meq/L (ref 19–32)
Calcium: 8.4 mg/dL (ref 8.4–10.5)
Creatinine, Ser: 0.84 mg/dL (ref 0.40–1.20)
GFR: 88.01 mL/min (ref >60.00)
GLUCOSE: 77 mg/dL (ref 70–99)
POTASSIUM: 4.2 meq/L (ref 3.5–5.1)
SODIUM: 141 meq/L (ref 135–145)
TOTAL PROTEIN: 5.9 g/dL — AB (ref 6.0–8.3)
Total Bilirubin: 0.2 mg/dL (ref 0.2–1.2)

## 2016-12-29 MED ORDER — NITROFURANTOIN MONOHYD MACRO 100 MG PO CAPS: 100.0000 mg | ORAL_CAPSULE | Freq: Two times a day (BID) | ORAL | 0 refills | Status: DC

## 2016-12-29 NOTE — Assessment & Plan Note (Signed)
Will maintain off meds for now with normal BP and still recovering. As she becomes more mobile we may need to add back gradually.

## 2016-12-29 NOTE — Assessment & Plan Note (Signed)
Deconditioning from recent hospital stay and home health PT ordered as she is not safe to leave the home by herself.

## 2016-12-29 NOTE — Assessment & Plan Note (Signed)
She will follow up with sports medicine for possible injection after fall. She already has a visit with Dr. Vertell Limber for follow up of her back in about 1 month.

## 2016-12-29 NOTE — Assessment & Plan Note (Signed)
Checking U/A for signs of infection with recent catheter in the hospital.

## 2016-12-29 NOTE — Progress Notes (Signed)
   Subjective:    Patient ID: [name], female    DOB: [birth year], 61 y.o.   MRN: [id number]  HPI The patient is a 61 YO female coming in for hospital follow up (low BP and poor PO intake with ARF, given fluids and zofran with monitoring, stopped all BP meds, renal function gradually improving). She is having better BP while home and no more lightheadedness. She denies syncope or pre-syncope. She is still feeling very weak. Appetite gradually improving. Some mild nausea but no vomiting. No diarrhea or constipation. Denies chest pains or SOB. She is still having severe knee pain from her fall in [place] to right knee and does not always feel stable.Also some new burning with urination and she is not sure if it is infection or from the catheter in the hospital.   PMH, [facility], social history reviewed and updated.    Review of Systems  Constitutional: Positive for activity change, appetite change and fatigue. Negative for chills, fever and unexpected weight change.  HENT: Negative.   Eyes: Negative.   Respiratory: Negative for cough, chest tightness and shortness of breath.   Cardiovascular: Negative for chest pain, palpitations and leg swelling.  Gastrointestinal: Positive for nausea. Negative for abdominal distention, abdominal pain, constipation, diarrhea and vomiting.  Musculoskeletal: Positive for arthralgias, gait problem and myalgias.  Skin: Negative.   Neurological: Positive for weakness. Negative for dizziness, light-headedness and numbness.  Psychiatric/Behavioral: Negative.       Objective:   Physical Exam  Constitutional: She is oriented to person, place, and time. She appears well-developed and well-nourished.  HENT:  Head: Normocephalic and atraumatic.  Eyes: EOM are normal.  Neck: Normal range of motion.  Cardiovascular: Normal rate and regular rhythm.   Pulmonary/Chest: Effort normal and breath sounds normal. No respiratory distress. She has no wheezes. She has no  rales.  Abdominal: Soft. Bowel sounds are normal. She exhibits no distension. There is no tenderness. There is no rebound.  Musculoskeletal: She exhibits tenderness. She exhibits no edema.  Right knee pain  Neurological: She is alert and oriented to person, place, and time. Coordination abnormal.  Cane for ambulation  Skin: Skin is warm and dry.  Psychiatric: She has a normal mood and affect.   Vitals:   12/29/16 0909  BP: 136/80  Pulse: 66  Temp: 98.8 F (37.1 C)  TempSrc: Oral  SpO2: 98%  Weight: 245 lb (111.1 kg)  Height: 5\' 5"  (1.651 m)      Assessment & Plan:

## 2016-12-29 NOTE — Assessment & Plan Note (Signed)
Checking CMP and watch for return to baseline. Dramatic improvement in the hospital.

## 2016-12-29 NOTE — Patient Instructions (Signed)
We will check the blood work and urine today.   Make an appointment for the knee.   We will have therapy coming out to the house.   Every day in the hospital takes about a week to recover from so you could still be recovering for the next 1-2 months to get back to before.

## 2016-12-30 ENCOUNTER — Encounter: Payer: Self-pay | Admitting: Internal Medicine

## 2016-12-30 NOTE — Progress Notes (Signed)
Abstracted and sent to scan  

## 2017-01-05 DIAGNOSIS — M25561 Pain in right knee: Secondary | ICD-10-CM | POA: Diagnosis not present

## 2017-01-05 DIAGNOSIS — R269 Unspecified abnormalities of gait and mobility: Secondary | ICD-10-CM | POA: Diagnosis not present

## 2017-01-05 DIAGNOSIS — M545 Low back pain: Secondary | ICD-10-CM | POA: Diagnosis not present

## 2017-01-06 ENCOUNTER — Telehealth: Payer: Self-pay | Admitting: Internal Medicine

## 2017-01-06 NOTE — Telephone Encounter (Signed)
[name] [phone] calling [facility]   Pt bp is raising and she is off her bp meds.  Pt is being seen tomorrow so she wanted Dr Sharlet Salina to talk to her about her bp .

## 2017-01-11 ENCOUNTER — Encounter: Payer: Self-pay | Admitting: Family Medicine

## 2017-01-11 ENCOUNTER — Ambulatory Visit: Payer: Self-pay

## 2017-01-11 ENCOUNTER — Ambulatory Visit (INDEPENDENT_AMBULATORY_CARE_PROVIDER_SITE_OTHER): Payer: Medicare HMO | Admitting: Family Medicine

## 2017-01-11 VITALS — BP 138/102 | HR 68 | Ht 67.0 in | Wt 243.0 lb

## 2017-01-11 DIAGNOSIS — M1711 Unilateral primary osteoarthritis, right knee: Secondary | ICD-10-CM | POA: Diagnosis not present

## 2017-01-11 DIAGNOSIS — M25561 Pain in right knee: Secondary | ICD-10-CM | POA: Diagnosis not present

## 2017-01-11 DIAGNOSIS — G8929 Other chronic pain: Secondary | ICD-10-CM

## 2017-01-11 NOTE — Progress Notes (Signed)
[name] [address], [place] [id number] Phone: [phone] Subjective:     CC: right knee pain  [id number]  [name] is a 61 y.o. female coming in with complaint of Right knee pain. Known arthritic changes. Patient recently had a fall. States that the pain is severe. Starting to affect daily activities. Walking with the aid of a cane. Patient states that unfortunate having swelling as well. Affecting daily activities.      Past Medical History:  Diagnosis Date  . Anemia    takes Ferrous Sulfate daily  . Anxiety    takes Citalopram daily  . Arthritis   . Asthma    2004-prior to gastric bypass and no problems since  . CHF (congestive heart failure) (HCC)    takes Lasix daily as needed  . Chronic back pain    spondylolisthesis/stenosis/radiculopathy  . Complication of anesthesia yrs ago   slow to wake up  . Depression   . Diabetes ([place])   . DVT (deep venous thrombosis) ([place]) 01-17-13   past hx. -tx.5-6 yrs ago bilateral legs, occ. sporadic swelling, has IVC filter implanted  . Dysrhythmia    "heart tends to flutter"  . Fibromyalgia   . Fracture    right foot and is in a cam boot  . Gallstones   . GERD (gastroesophageal reflux disease)    hx of-no meds now  . Heart murmur   . History of bronchitis 2012 or 2013  . History of colon polyps   . Hypertension    takes Metoprolol daily  . Insomnia    takes Melatonin daily  . Joint pain   . Joint swelling   . Pelvic floor dysfunction   . Peripheral neuropathy   . Pneumonia 90's   hx of  . S/P gastric bypass   . Sleep apnea    no cpap used in many yrs after weight lost-no machine now  . Urinary frequency   . Urinary urgency   . Vitamin D deficiency    Past Surgical History:  Procedure Laterality Date  . BALLOON DILATION N/A 01/31/2013   Procedure: BALLOON DILATION;  Surgeon: Arta Silence, MD;  Location: [facility];  Service: Endoscopy;  Laterality: N/A;    . CHOLECYSTECTOMY  2007  . COLONOSCOPY    . DILATION AND CURETTAGE OF UTERUS  yrs ago  . ESOPHAGOGASTRODUODENOSCOPY (EGD) WITH PROPOFOL  03/01/2012   Procedure: ESOPHAGOGASTRODUODENOSCOPY (EGD) WITH PROPOFOL;  Surgeon: Arta Silence, MD;  Location: [facility];  Service: Endoscopy;  Laterality: N/A;  . ESOPHAGOGASTRODUODENOSCOPY (EGD) WITH PROPOFOL N/A 01/31/2013   Procedure: ESOPHAGOGASTRODUODENOSCOPY (EGD) WITH PROPOFOL;  Surgeon: Arta Silence, MD;  [facility];  Service: Endoscopy;  Laterality: N/A;  . ESOPHAGOGASTRODUODENOSCOPY (EGD) WITH PROPOFOL N/A 09/02/2015   Procedure: ESOPHAGOGASTRODUODENOSCOPY (EGD) WITH PROPOFOL;  Surgeon: Gatha Mayer, MD;  [facility]: [facility];  Service: Endoscopy;  Laterality: N/A;  . GASTRIC BY-PASS  2004  . HERNIA REPAIR  2005  . INSERTION OF VENA CAVA FILTER  01-17-13   inserted 2004- "abdomen"  . LIGAMENT REPAIR Right 1987   Rt. knee scope  . MAXIMUM ACCESS (MAS)POSTERIOR LUMBAR INTERBODY FUSION (PLIF) 2 LEVEL N/A 06/07/2014   Procedure: L4-5 L5-S1 FOR MAXIMUM ACCESS (MAS) POSTERIOR LUMBAR INTERBODY FUSION ;  Surgeon: Erline Levine, MD;  Location: [facility];  Service: Neurosurgery;  Laterality: N/A;  L4-5 L5-S1 FOR MAXIMUM ACCESS (MAS) POSTERIOR LUMBAR INTERBODY FUSION   . TONSILLECTOMY     as child  Social History   Social History  . Marital status: Widowed    Spouse name: N/A  . Number of children: 0  . Years of education: college   Occupational History  . retired    Social History Main Topics  . Smoking status: Former Smoker    Packs/day: 0.50    Years: 10.00    Types: Cigarettes    Quit date: 04/19/2009  . Smokeless tobacco: Never Used  . Alcohol use 0.0 oz/week     Comment: occ. social- wine-1 drink monthly  . Drug use: No  . Sexual activity: Not Currently   Other Topics Concern  . None   Social History Narrative  . None   Allergies  Allergen Reactions  . Carafate [Sucralfate] Hives    Other reaction(s):  Angioedema (ALLERGY/intolerance) hives scratching  . Sulfonamide Derivatives Rash    REACTION: Syncope   Family History  Problem Relation Age of Onset  . Diabetes Mother   . Heart disease Father   . Hypertension Father   . Prostate cancer Father   . Kidney disease Brother   . Colon cancer Neg Hx   . Colon polyps Neg Hx   . Stomach cancer Neg Hx   . Esophageal cancer Neg Hx   . Pancreatic cancer Neg Hx   . Liver disease Neg Hx      Past medical history, social, surgical and family history all reviewed in electronic medical record.  No pertanent information unless stated regarding to the chief complaint.   Review of Systems:Review of systems updated and as accurate as of 01/11/17  No headache, visual changes, nausea, vomiting, diarrhea, constipation, dizziness, abdominal pain, skin rash, fevers, chills, night sweats, weight loss, swollen lymph nodes, body aches, joint swelling,  chest pain, shortness of breath, mood changes. Positive muscle aches  Objective  Blood pressure (!) 138/102, pulse 68, height 5\' 7"  (1.702 m), weight 243 lb (110.2 kg), SpO2 97 %. Systems examined below as of 01/11/17   General: No apparent distress alert and oriented x3 mood and affect normal, dressed appropriately.  HEENT: Pupils equal, extraocular movements intact  Respiratory: Patient's speak in full sentences and does not appear short of breath  Cardiovascular: No lower extremity edema, non tender, no erythema  Skin: Warm dry intact with no signs of infection or rash on extremities or on axial skeleton.  Abdomen: Soft nontender  Neuro: Cranial nerves II through XII are intact, neurovascularly intact in all extremities with 2+ DTRs and 2+ pulses.  Lymph: No lymphadenopathy of posterior or anterior cervical chain or axillae bilaterally.  Gait Antalgic gait MSK:  tender with full range of motion and good stability and symmetric strength and tone of shoulders, elbows, wrist, hip, and ankles bilaterally.  Arthritic changes of multiple joints Knee: Right valgus deformity noted. Large thigh to calf ratio.  Tender to palpation over medial and PF joint line.  ROM full in flexion and extension and lower leg rotation. instability with valgus force.  painful patellar compression. Patellar glide with moderate crepitus. Patellar and quadriceps tendons unremarkable. Hamstring and quadriceps strength is normal. Contralateral knee shows mild arthritic changes as well  After informed written and verbal consent, patient was seated on exam table. Right knee was prepped with alcohol swab and utilizing anterolateral approach, patient's right knee space was injected with 4:1  marcaine 0.5%: Kenalog 40mg /dL. Patient tolerated the procedure well without immediate complications.   Impression and Recommendations:     This case required medical decision making of moderate  complexity.      Note: This dictation was prepared with Dragon dictation along with smaller phrase technology. Any transcriptional errors that result from this process are unintentional.

## 2017-01-11 NOTE — Patient Instructions (Signed)
Good to see you  We injected the knee again.  If this works great! We will get you a custom brace as well I think will do great  See me again in 4 weeks and if not better we can start orthovisc.

## 2017-01-11 NOTE — Progress Notes (Signed)
[name] [address] [place], [place] [address] Phone: [phone] Subjective:    I'm seeing this patient by the request  of:    CC:   [id number]  [name] is a 61 y.o. female coming in with complaint of right knee pain. She did fall on her knee on August 24th, 2018. She states that her knee was swollen following the fall. It is hard for her to put weight on the right knee. She does have pain that radiates up the thigh as well. She does have a history of a ligament repair in that knee from 1986. She does experience a giving out sensation. She uses a cane and a walker when needed. She is in physical therapy per Dr. Sharlet Salina. She said that she is having so much pain that she cannot tell a difference from the exercises in physical therapy.  Onset-  Location Duration-  Character- Aggravating factors- Reliving factors-  Therapies tried-  Severity-     Past Medical History:  Diagnosis Date  . Anemia    takes Ferrous Sulfate daily  . Anxiety    takes Citalopram daily  . Arthritis   . Asthma    2004-prior to gastric bypass and no problems since  . CHF (congestive heart failure) (HCC)    takes Lasix daily as needed  . Chronic back pain    spondylolisthesis/stenosis/radiculopathy  . Complication of anesthesia yrs ago   slow to wake up  . Depression   . Diabetes ([place])   . DVT (deep venous thrombosis) ([place]) 01-17-13   past hx. -tx.5-6 yrs ago bilateral legs, occ. sporadic swelling, has IVC filter implanted  . Dysrhythmia    "heart tends to flutter"  . Fibromyalgia   . Fracture    right foot and is in a cam boot  . Gallstones   . GERD (gastroesophageal reflux disease)    hx of-no meds now  . Heart murmur   . History of bronchitis 2012 or 2013  . History of colon polyps   . Hypertension    takes Metoprolol daily  . Insomnia    takes Melatonin daily  . Joint pain   . Joint swelling   . Pelvic floor dysfunction   . Peripheral  neuropathy   . Pneumonia 90's   hx of  . S/P gastric bypass   . Sleep apnea    no cpap used in many yrs after weight lost-no machine now  . Urinary frequency   . Urinary urgency   . Vitamin D deficiency    Past Surgical History:  Procedure Laterality Date  . BALLOON DILATION N/A 01/31/2013   Procedure: BALLOON DILATION;  Surgeon: Arta Silence, MD;  [facility];  Service: Endoscopy;  Laterality: N/A;  . CHOLECYSTECTOMY  2007  . COLONOSCOPY    . DILATION AND CURETTAGE OF UTERUS  yrs ago  . ESOPHAGOGASTRODUODENOSCOPY (EGD) WITH PROPOFOL  03/01/2012   Procedure: ESOPHAGOGASTRODUODENOSCOPY (EGD) WITH PROPOFOL;  Surgeon: Arta Silence, MD;  [facility];  Service: Endoscopy;  Laterality: N/A;  . ESOPHAGOGASTRODUODENOSCOPY (EGD) WITH PROPOFOL N/A 01/31/2013   Procedure: ESOPHAGOGASTRODUODENOSCOPY (EGD) WITH PROPOFOL;  Surgeon: Arta Silence, MD;  [facility];  Service: Endoscopy;  Laterality: N/A;  . ESOPHAGOGASTRODUODENOSCOPY (EGD) WITH PROPOFOL N/A 09/02/2015   Procedure: ESOPHAGOGASTRODUODENOSCOPY (EGD) WITH PROPOFOL;  Surgeon: Gatha Mayer, MD;  [facility];  Service: Endoscopy;  Laterality: N/A;  . GASTRIC BY-PASS  2004  . HERNIA REPAIR  2005  .  INSERTION OF VENA CAVA FILTER  01-17-13   inserted 2004- "abdomen"  . LIGAMENT REPAIR Right 1987   Rt. knee scope  . MAXIMUM ACCESS (MAS)POSTERIOR LUMBAR INTERBODY FUSION (PLIF) 2 LEVEL N/A 06/07/2014   Procedure: L4-5 L5-S1 FOR MAXIMUM ACCESS (MAS) POSTERIOR LUMBAR INTERBODY FUSION ;  Surgeon: Erline Levine, MD;  Location: [facility];  Service: Neurosurgery;  Laterality: N/A;  L4-5 L5-S1 FOR MAXIMUM ACCESS (MAS) POSTERIOR LUMBAR INTERBODY FUSION   . TONSILLECTOMY     as child   Social History   Social History  . Marital status: Widowed    Spouse name: N/A  . Number of children: 0  . Years of education: college   Occupational History  . retired    Social History Main Topics  . Smoking  status: Former Smoker    Packs/day: 0.50    Years: 10.00    Types: Cigarettes    Quit date: 04/19/2009  . Smokeless tobacco: Never Used  . Alcohol use 0.0 oz/week     Comment: occ. social- wine-1 drink monthly  . Drug use: No  . Sexual activity: Not Currently   Other Topics Concern  . Not on file   Social History Narrative  . No narrative on file   Allergies  Allergen Reactions  . Carafate [Sucralfate] Hives    Other reaction(s): Angioedema (ALLERGY/intolerance) hives scratching  . Sulfonamide Derivatives Rash    REACTION: Syncope   Family History  Problem Relation Age of Onset  . Diabetes Mother   . Heart disease Father   . Hypertension Father   . Prostate cancer Father   . Kidney disease Brother   . Colon cancer Neg Hx   . Colon polyps Neg Hx   . Stomach cancer Neg Hx   . Esophageal cancer Neg Hx   . Pancreatic cancer Neg Hx   . Liver disease Neg Hx      Past medical history, social, surgical and family history all reviewed in electronic medical record.  No pertanent information unless stated regarding to the chief complaint.   Review of Systems:Review of systems updated and as accurate as of 01/11/17  No headache, visual changes, nausea, vomiting, diarrhea, constipation, dizziness, abdominal pain, skin rash, fevers, chills, night sweats, weight loss, swollen lymph nodes, body aches, joint swelling, muscle aches, chest pain, shortness of breath, mood changes.   Objective  There were no vitals taken for this visit. Systems examined below as of 01/11/17   General: No apparent distress alert and oriented x3 mood and affect normal, dressed appropriately.  HEENT: Pupils equal, extraocular movements intact  Respiratory: Patient's speak in full sentences and does not appear short of breath  Cardiovascular: No lower extremity edema, non tender, no erythema  Skin: Warm dry intact with no signs of infection or rash on extremities or on axial skeleton.  Abdomen: Soft  nontender  Neuro: Cranial nerves II through XII are intact, neurovascularly intact in all extremities with 2+ DTRs and 2+ pulses.  Lymph: No lymphadenopathy of posterior or anterior cervical chain or axillae bilaterally.  Gait normal with good balance and coordination.  MSK:  Non tender with full range of motion and good stability and symmetric strength and tone of shoulders, elbows, wrist, hip, knee and ankles bilaterally.     Impression and Recommendations:     This case required medical decision making of moderate complexity.      Note: This dictation was prepared with Dragon dictation along with smaller phrase technology. Any transcriptional errors  that result from this process are unintentional.

## 2017-01-11 NOTE — Assessment & Plan Note (Signed)
Patient given injection. Tolerated the procedure well. We discussed icing regimen and home exercises. Topical anti-inflammatories given. Because patient's large thigh to calf ratio and custom brace as necessary. Patient will be set up for this as well. Follow-up again in 4 weeks. Could be a candidate for viscous supplementation.

## 2017-01-19 DIAGNOSIS — M545 Low back pain: Secondary | ICD-10-CM | POA: Diagnosis not present

## 2017-01-19 DIAGNOSIS — R269 Unspecified abnormalities of gait and mobility: Secondary | ICD-10-CM | POA: Diagnosis not present

## 2017-01-19 DIAGNOSIS — M25561 Pain in right knee: Secondary | ICD-10-CM | POA: Diagnosis not present

## 2017-01-21 ENCOUNTER — Encounter: Payer: Self-pay | Admitting: Family Medicine

## 2017-01-24 ENCOUNTER — Other Ambulatory Visit: Payer: Self-pay | Admitting: Internal Medicine

## 2017-01-26 DIAGNOSIS — M4316 Spondylolisthesis, lumbar region: Secondary | ICD-10-CM | POA: Diagnosis not present

## 2017-01-26 DIAGNOSIS — I10 Essential (primary) hypertension: Secondary | ICD-10-CM | POA: Diagnosis not present

## 2017-01-26 DIAGNOSIS — M545 Low back pain: Secondary | ICD-10-CM | POA: Diagnosis not present

## 2017-01-26 DIAGNOSIS — M5416 Radiculopathy, lumbar region: Secondary | ICD-10-CM | POA: Diagnosis not present

## 2017-02-01 DIAGNOSIS — M1711 Unilateral primary osteoarthritis, right knee: Secondary | ICD-10-CM | POA: Diagnosis not present

## 2017-02-03 ENCOUNTER — Encounter: Payer: Self-pay | Admitting: Podiatry

## 2017-02-03 ENCOUNTER — Ambulatory Visit (INDEPENDENT_AMBULATORY_CARE_PROVIDER_SITE_OTHER): Payer: Medicare HMO | Admitting: Podiatry

## 2017-02-03 VITALS — BP 154/81 | HR 63 | Ht 66.0 in | Wt 235.0 lb

## 2017-02-03 DIAGNOSIS — G609 Hereditary and idiopathic neuropathy, unspecified: Secondary | ICD-10-CM

## 2017-02-03 DIAGNOSIS — M216X9 Other acquired deformities of unspecified foot: Secondary | ICD-10-CM | POA: Diagnosis not present

## 2017-02-03 DIAGNOSIS — L97501 Non-pressure chronic ulcer of other part of unspecified foot limited to breakdown of skin: Secondary | ICD-10-CM

## 2017-02-03 NOTE — Progress Notes (Signed)
SUBJECTIVE: 61 y.o. year old female presents complaining of painful knots on side of foot. Pain started a year ago and got progressively worse. Became very painful for the last 4-5 weeks. Was treated with injections without long lasting relief. This morning blood sugar was 82. Patient was referred by Dr. Leonette Nutting.    Positive for being diagnosed with Fibromyalgia since 2010.  Back surgery done in 2016. Knee surgery done in 1995 on right.  Review of Systems  Constitutional: Negative for chills, fever, malaise/fatigue and weight loss.  HENT: Negative for ear discharge, ear pain, hearing loss, nosebleeds and tinnitus.   Eyes: Negative for blurred vision, double vision, photophobia and pain.  Respiratory: Negative for cough, hemoptysis, sputum production and shortness of breath.   Cardiovascular: Negative for chest pain, palpitations, orthopnea and claudication.  Gastrointestinal: Negative for abdominal pain, heartburn, nausea and vomiting.  Genitourinary: Negative for dysuria, frequency, hematuria and urgency.  Musculoskeletal: Positive for back pain, joint pain and neck pain.  Neurological: Positive for tingling and sensory change. Negative for dizziness and headaches.   OBJECTIVE: DERMATOLOGIC EXAMINATION: Pre ulcerative thick plantar callus under 5th Metatarsal base bilateral, symptomatic with weight bearing. No drainage or open skin noted. VASCULAR EXAMINATION OF LOWER LIMBS: All pedal pulses are palpable with normal pulsation.  Capillary Filling times within 3 seconds in all digits.  Mild edema and erythema surrounding skin lesion bilateral. Temperature gradient from tibial crest to dorsum of foot is within normal bilateral.  NEUROLOGIC EXAMINATION OF THE LOWER LIMBS: Subjective numbness and tingling on lower limbs bilateral. Sharp and Dull discriminatory sensations at the plantar ball of hallux is intact bilateral.   MUSCULOSKELETAL EXAMINATION: Positive for cavovarus  deformity bilateral. Severely supinated mid foot and rearfoot with protruding 5th metatarsal base bilateral.                    ASSESSMENT: Severe cavovarus, supinated foot with lateral weight shifting and plantar lateral skin lesion at the 5th metatarsal base, symptomatic bilateral. Pre ulcerative porokeratosis plantar 5th metatarsal base bilateral, symptomatic with inflamed soft tissue. Peripheral neuropathy bilateral. Diabetic under control.  PLAN: Reviewed findings and available treatment options, palliation, lower limb brace, change in activity. Plantar lesions debrided and padded. Advised to reduce weight bearing activity till AFO is prepared. May avoid flat shoes or barefoot. Stay in well supported lace up shoes. Discussed benefit of AFO. Will call for prior authorization on Ritch brace. Return for follow up on plantar skin lesion in 2 weeks.

## 2017-02-03 NOTE — Patient Instructions (Addendum)
Seen for painful feet at the base of 5th metatarsal. Noted of severe Cavovarus foot with lateral weight shifting and callus formation. Lesion debrided and padded. Stay off of feet more and limit weight bearing while symptomatic. Use lace up well supported tennis shoes. May benefit from [facility]. Will call for prior authorization. Return in 2 weeks for follow up on painful pre ulcerative lesions.

## 2017-02-07 DIAGNOSIS — M79602 Pain in left arm: Secondary | ICD-10-CM | POA: Diagnosis not present

## 2017-02-07 DIAGNOSIS — M79601 Pain in right arm: Secondary | ICD-10-CM | POA: Diagnosis not present

## 2017-02-07 DIAGNOSIS — M79605 Pain in left leg: Secondary | ICD-10-CM | POA: Diagnosis not present

## 2017-02-07 DIAGNOSIS — M542 Cervicalgia: Secondary | ICD-10-CM | POA: Diagnosis not present

## 2017-02-07 DIAGNOSIS — M545 Low back pain: Secondary | ICD-10-CM | POA: Diagnosis not present

## 2017-02-07 DIAGNOSIS — M25561 Pain in right knee: Secondary | ICD-10-CM | POA: Diagnosis not present

## 2017-02-07 DIAGNOSIS — R2689 Other abnormalities of gait and mobility: Secondary | ICD-10-CM | POA: Diagnosis not present

## 2017-02-08 ENCOUNTER — Ambulatory Visit: Payer: Medicare HMO | Admitting: Family Medicine

## 2017-03-16 ENCOUNTER — Ambulatory Visit: Payer: Medicare HMO | Admitting: Podiatry

## 2017-03-30 ENCOUNTER — Ambulatory Visit: Payer: Medicare HMO | Admitting: Podiatry

## 2017-04-05 ENCOUNTER — Ambulatory Visit (INDEPENDENT_AMBULATORY_CARE_PROVIDER_SITE_OTHER): Payer: Medicare HMO | Admitting: Podiatry

## 2017-04-05 DIAGNOSIS — M216X1 Other acquired deformities of right foot: Secondary | ICD-10-CM | POA: Diagnosis not present

## 2017-04-05 DIAGNOSIS — G609 Hereditary and idiopathic neuropathy, unspecified: Secondary | ICD-10-CM

## 2017-04-05 DIAGNOSIS — M216X9 Other acquired deformities of unspecified foot: Secondary | ICD-10-CM | POA: Diagnosis not present

## 2017-04-05 DIAGNOSIS — M216X2 Other acquired deformities of left foot: Secondary | ICD-10-CM

## 2017-04-05 DIAGNOSIS — L97501 Non-pressure chronic ulcer of other part of unspecified foot limited to breakdown of skin: Secondary | ICD-10-CM | POA: Diagnosis not present

## 2017-04-05 NOTE — Progress Notes (Signed)
Subjective: 61 y.o. year old female patient presents complaining of painful feet and to have AFO prepared. Patient is presenting with ill supported flat soft shoes. Stated that her both feet have been very painful from the plantar lesion L>R. Her last blood sugar level was 93 yesterday. Patient also wishes to have diabetic shoes.  Positive for being diagnosed with Fibromyalgia since 2010.  Back surgery done in 2016. Knee surgery done in 1995 on right.  Objective: Dermatologic: Pre ulcerative plantar callus under 5th metatarsal base bilateral, painful with weight bearing. Vascular: Pedal pulses are all palpable. Orthopedic: Severe Cavovarus foot bilateral. Supinated midfoot and rearfoot. Protruding 5th metatarsal base bilateral. Neurologic: Subjective numbness and tingling inlower limbs bilateral. Radiographic examination reveal severely supinated foot with calaneal varus bilateral. Severe metatarsus adductus bilateral.  Assessment: Severe supinated foot with Cavovarus deformity. Lateral weight shifting with weight bearing.  Pre ulcerative skin lesion symptomatic plantar 5th metatarsal base bilateral L>R. Peripheral neuropathy. Diabetic under control.  Treatment: Plantar lesion debrided and padded. Both lower limb negative impressions taken to order AFO (Ritch brace).

## 2017-04-07 ENCOUNTER — Encounter: Payer: Self-pay | Admitting: Podiatry

## 2017-04-07 DIAGNOSIS — M216X9 Other acquired deformities of unspecified foot: Secondary | ICD-10-CM | POA: Insufficient documentation

## 2017-04-07 NOTE — Patient Instructions (Signed)
Both feet negative impression taken for AFO prep. Both lesions debrided and padded with home care instruction.

## 2017-04-18 ENCOUNTER — Ambulatory Visit: Payer: Medicare HMO | Admitting: Podiatry

## 2017-04-27 ENCOUNTER — Encounter: Payer: Self-pay | Admitting: Internal Medicine

## 2017-04-28 ENCOUNTER — Encounter: Payer: Self-pay | Admitting: Internal Medicine

## 2017-04-28 ENCOUNTER — Ambulatory Visit (INDEPENDENT_AMBULATORY_CARE_PROVIDER_SITE_OTHER): Payer: Medicare HMO | Admitting: Internal Medicine

## 2017-04-28 ENCOUNTER — Other Ambulatory Visit (INDEPENDENT_AMBULATORY_CARE_PROVIDER_SITE_OTHER): Payer: Medicare HMO

## 2017-04-28 VITALS — BP 130/80 | HR 60 | Temp 98.2°F | Ht 66.0 in | Wt 243.0 lb

## 2017-04-28 DIAGNOSIS — R200 Anesthesia of skin: Secondary | ICD-10-CM

## 2017-04-28 DIAGNOSIS — M5010 Cervical disc disorder with radiculopathy, unspecified cervical region: Secondary | ICD-10-CM

## 2017-04-28 LAB — COMPREHENSIVE METABOLIC PANEL
ALT: 23 U/L (ref 0–35)
AST: 22 U/L (ref 0–37)
Albumin: 3.9 g/dL (ref 3.5–5.2)
Alkaline Phosphatase: 144 U/L — ABNORMAL HIGH (ref 39–117)
BILIRUBIN TOTAL: 0.4 mg/dL (ref 0.2–1.2)
BUN: 12 mg/dL (ref 6–23)
CALCIUM: 8.4 mg/dL (ref 8.4–10.5)
CO2: 21 meq/L (ref 19–32)
CREATININE: 0.71 mg/dL (ref 0.40–1.20)
Chloride: 114 mEq/L — ABNORMAL HIGH (ref 96–112)
GFR: 106.74 mL/min (ref >60.00)
GLUCOSE: 86 mg/dL (ref 70–99)
Potassium: 3.9 mEq/L (ref 3.5–5.1)
SODIUM: 142 meq/L (ref 135–145)
Total Protein: 6.6 g/dL (ref 6.0–8.3)

## 2017-04-28 LAB — CBC
HCT: 37 % (ref 36.0–46.0)
Hemoglobin: 11.9 g/dL — ABNORMAL LOW (ref 12.0–15.0)
MCHC: 32.1 g/dL (ref 30.0–36.0)
MCV: 85 fl (ref 78.0–100.0)
PLATELETS: 204 10*3/uL (ref 150.0–400.0)
RBC: 4.35 Mil/uL (ref 3.87–5.11)
RDW: 15.9 % — AB (ref 11.5–15.5)
WBC: 5.3 10*3/uL (ref 4.0–10.5)

## 2017-04-28 LAB — TROPONIN I: TNIDX: 0 ug/L (ref 0.00–0.06)

## 2017-04-28 MED ORDER — METHYLPREDNISOLONE ACETATE 40 MG/ML IJ SUSP
40.0000 mg | Freq: Once | INTRAMUSCULAR | Status: AC
Start: 2017-04-28 — End: 2017-04-28
  Administered 2017-04-28: 40 mg via INTRAMUSCULAR

## 2017-04-28 MED ORDER — TRAMADOL HCL 50 MG PO TABS: 50.0000 mg | ORAL_TABLET | Freq: Four times a day (QID) | ORAL | 2 refills | Status: DC

## 2017-04-28 NOTE — Progress Notes (Addendum)
   Subjective:    Patient ID: [name], female    DOB: Jan 04, 1956, 62 y.o.   MRN: [id number]  HPI The patient is a 62 YO female coming in for left hand numbness and tingling. This is keeping her from sleeping at night time. She is also having some tightness in her neck and has had problems with it before. She denies having injections in her neck before. She denies fevers or chills. No chest pains or tightness. Not worse with activities. She has not tried anything for it as she was not sure what she could take. Overall worsening and going on for several weeks now.   Review of Systems  Constitutional: Positive for activity change. Negative for appetite change, chills, fatigue, fever and unexpected weight change.  Respiratory: Negative.   Cardiovascular: Negative.   Gastrointestinal: Negative.   Musculoskeletal: Positive for neck pain and neck stiffness. Negative for arthralgias, back pain, gait problem, joint swelling and myalgias.  Skin: Negative.   Neurological: Positive for numbness.      Objective:   Physical Exam  Constitutional: She is oriented to person, place, and time. She appears well-developed and well-nourished.  HENT:  Head: Normocephalic and atraumatic.  Eyes: EOM are normal.  Neck: Normal range of motion.  Cardiovascular: Normal rate and regular rhythm.  Pulmonary/Chest: Effort normal and breath sounds normal. No respiratory distress. She has no wheezes. She has no rales.  Abdominal: Soft.  Musculoskeletal: She exhibits no edema.  Neurological: She is alert and oriented to person, place, and time. No cranial nerve deficit. Coordination normal.  Strength same left and right hand on exam  Skin: Skin is warm and dry.   Vitals:   04/28/17 1426  BP: 130/80  Pulse: 60  Temp: 98.2 F (36.8 C)  TempSrc: Oral  SpO2: 100%  Weight: 243 lb (110.2 kg)  Height: 5\' 6"  (1.676 m)   EKG: Rate 57, axis and intervals normal, no st or t wave changes, no change from prior  2018    Assessment & Plan:  Depo-medrol 40 mg IM given at visit

## 2017-04-28 NOTE — Patient Instructions (Signed)
The EKG of the heart is normal and not changed from before. We will check the labs today and have given you the steroid shot to see if this will help.  We will get you in with the neck specialist.

## 2017-04-30 NOTE — Assessment & Plan Note (Addendum)
Depo-medrol 40 mg IM given at visit. Referral to neurosurgery for possible injections. EKG unchanged and will check troponin to rule out cardiac although symptoms are not typical.

## 2017-05-03 ENCOUNTER — Encounter: Payer: Self-pay | Admitting: Internal Medicine

## 2017-05-03 DIAGNOSIS — H52209 Unspecified astigmatism, unspecified eye: Secondary | ICD-10-CM | POA: Diagnosis not present

## 2017-05-03 DIAGNOSIS — H524 Presbyopia: Secondary | ICD-10-CM | POA: Diagnosis not present

## 2017-05-03 DIAGNOSIS — H5203 Hypermetropia, bilateral: Secondary | ICD-10-CM | POA: Diagnosis not present

## 2017-05-03 MED ORDER — NITROFURANTOIN MONOHYD MACRO 100 MG PO CAPS: 100.0000 mg | ORAL_CAPSULE | Freq: Two times a day (BID) | ORAL | 0 refills | Status: DC

## 2017-05-04 ENCOUNTER — Ambulatory Visit: Payer: Medicare HMO | Admitting: Podiatry

## 2017-05-10 ENCOUNTER — Ambulatory Visit: Payer: Medicare HMO | Admitting: Podiatry

## 2017-05-20 ENCOUNTER — Ambulatory Visit (INDEPENDENT_AMBULATORY_CARE_PROVIDER_SITE_OTHER): Payer: Medicare HMO | Admitting: Internal Medicine

## 2017-05-20 ENCOUNTER — Encounter: Payer: Self-pay | Admitting: Internal Medicine

## 2017-05-20 DIAGNOSIS — R05 Cough: Secondary | ICD-10-CM | POA: Diagnosis not present

## 2017-05-20 DIAGNOSIS — R059 Cough, unspecified: Secondary | ICD-10-CM

## 2017-05-20 MED ORDER — BENZONATATE 200 MG PO CAPS: 200.0000 mg | ORAL_CAPSULE | Freq: Three times a day (TID) | ORAL | 0 refills | Status: DC | PRN

## 2017-05-20 MED ORDER — AZITHROMYCIN 250 MG PO TABS: ORAL_TABLET | ORAL | 0 refills | Status: DC

## 2017-05-20 NOTE — Progress Notes (Signed)
   Subjective:    Patient ID: [name], female    DOB: Oct 02, 1955, 62 y.o.   MRN: [id number]  HPI The patient is a 62 YO female coming in for 1-2 weeks of nose congestion, cough, and SOB. She is overall worsening. She has been exposed to sick contacts. She is having mild sinus congestion. Denies fevers but having chills. Some production to sputum yellow to green. Has taken mucinex and tessalon perles which were some helpful. She is now out of perles.  Review of Systems  Constitutional: Positive for activity change, appetite change and chills. Negative for fatigue, fever and unexpected weight change.  HENT: Positive for congestion, postnasal drip, rhinorrhea and sinus pressure. Negative for ear discharge, ear pain, sinus pain, sneezing, sore throat, tinnitus, trouble swallowing and voice change.   Eyes: Negative.   Respiratory: Positive for cough. Negative for chest tightness, shortness of breath and wheezing.   Cardiovascular: Negative.   Gastrointestinal: Negative.   Musculoskeletal: Positive for myalgias.  Neurological: Negative.       Objective:   Physical Exam  Constitutional: She is oriented to person, place, and time. She appears well-developed and well-nourished.  HENT:  Head: Normocephalic and atraumatic.  Oropharynx with redness and clear drainage, nose with swollen turbinates, TMs normal bilaterally  Eyes: EOM are normal.  Neck: Normal range of motion. No thyromegaly present.  Cardiovascular: Normal rate and regular rhythm.  Pulmonary/Chest: Effort normal and breath sounds normal. No respiratory distress. She has no wheezes. She has no rales.  Mild rhonchi which partially clear with coughing  Abdominal: Soft.  Musculoskeletal: She exhibits tenderness.  Lymphadenopathy:    She has no cervical adenopathy.  Neurological: She is alert and oriented to person, place, and time.  Skin: Skin is warm and dry.   Vitals:   05/20/17 1548  BP: 110/68  Pulse: (!) 55    Temp: 98.7 F (37.1 C)  TempSrc: Oral  SpO2: 100%  Weight: 239 lb (108.4 kg)  Height: 5\' 6"  (1.676 m)      Assessment & Plan:

## 2017-05-20 NOTE — Patient Instructions (Addendum)
Today take 2 pills of azithromycin, then tomorrow take 1 pill daily until it is gone.  We have sent in the tessalon perles.

## 2017-05-20 NOTE — Assessment & Plan Note (Signed)
Rx for tessalon perles and azithromycin. Would like to avoid steroids.

## 2017-06-01 ENCOUNTER — Encounter: Payer: Self-pay | Admitting: Internal Medicine

## 2017-06-02 MED ORDER — PREDNISONE 20 MG PO TABS: 40.0000 mg | ORAL_TABLET | Freq: Every day | ORAL | 0 refills | Status: DC

## 2017-06-02 NOTE — Addendum Note (Signed)
Addended by: Pricilla Holm A on: 06/02/2017 04:35 PM   Modules accepted: Orders

## 2017-06-10 ENCOUNTER — Telehealth: Payer: Self-pay

## 2017-06-10 NOTE — Telephone Encounter (Signed)
Called and left VM advising pt that Ritchie brace are ready to be picked up. Requested a call back with concerns.

## 2017-06-13 DIAGNOSIS — G5602 Carpal tunnel syndrome, left upper limb: Secondary | ICD-10-CM | POA: Diagnosis not present

## 2017-06-13 DIAGNOSIS — I10 Essential (primary) hypertension: Secondary | ICD-10-CM | POA: Diagnosis not present

## 2017-06-13 DIAGNOSIS — Z6839 Body mass index (BMI) 39.0-39.9, adult: Secondary | ICD-10-CM | POA: Diagnosis not present

## 2017-07-28 ENCOUNTER — Ambulatory Visit (INDEPENDENT_AMBULATORY_CARE_PROVIDER_SITE_OTHER): Payer: Medicare HMO | Admitting: Internal Medicine

## 2017-07-28 ENCOUNTER — Encounter: Payer: Self-pay | Admitting: Internal Medicine

## 2017-07-28 VITALS — BP 136/76 | HR 59 | Temp 98.6°F | Ht 66.0 in | Wt 238.0 lb

## 2017-07-28 DIAGNOSIS — R05 Cough: Secondary | ICD-10-CM | POA: Diagnosis not present

## 2017-07-28 DIAGNOSIS — R6889 Other general symptoms and signs: Secondary | ICD-10-CM

## 2017-07-28 DIAGNOSIS — R059 Cough, unspecified: Secondary | ICD-10-CM

## 2017-07-28 DIAGNOSIS — R300 Dysuria: Secondary | ICD-10-CM

## 2017-07-28 LAB — POCT URINALYSIS DIPSTICK
Bilirubin, UA: NEGATIVE
Blood, UA: NEGATIVE
Glucose, UA: NEGATIVE
KETONES UA: NEGATIVE
Leukocytes, UA: NEGATIVE
NITRITE UA: NEGATIVE
PH UA: 6 (ref 5.0–8.0)
PROTEIN UA: NEGATIVE
Spec Grav, UA: 1.02 (ref 1.010–1.025)
UROBILINOGEN UA: 0.2 U/dL

## 2017-07-28 LAB — POC INFLUENZA A&B (BINAX/QUICKVUE)
Influenza A, POC: NEGATIVE
Influenza B, POC: NEGATIVE

## 2017-07-28 MED ORDER — PREDNISONE 20 MG PO TABS: 40.0000 mg | ORAL_TABLET | Freq: Every day | ORAL | 0 refills | Status: DC

## 2017-07-28 MED ORDER — BENZONATATE 200 MG PO CAPS: 200.0000 mg | ORAL_CAPSULE | Freq: Three times a day (TID) | ORAL | 0 refills | Status: DC | PRN

## 2017-07-28 NOTE — Progress Notes (Signed)
   Subjective:    Patient ID: [name], female    DOB: 1955-08-05, 62 y.o.   MRN: [id number]  HPI The patient is a 62 YO female coming in for several concerns including cough (started 2 days ago with chills and aching, extreme fatigue, sister with flu recently, does have some nose drainage, non-productive cough, some SOB, overall worsening, has tried tylenol and zyrtec and mucinex without relief, tried tessalon perles with some relief) and possible UTI (having foul odor, frequency, urgency, having chills and possible fevers, denies nausea or vomiting, no stomach pain).   Review of Systems  Constitutional: Positive for activity change, appetite change, chills, fatigue and fever. Negative for unexpected weight change.  HENT: Positive for congestion, postnasal drip and rhinorrhea. Negative for ear discharge, ear pain, sinus pressure, sinus pain, sneezing, sore throat, tinnitus, trouble swallowing and voice change.   Eyes: Negative.   Respiratory: Positive for cough and shortness of breath. Negative for chest tightness and wheezing.   Cardiovascular: Negative.   Gastrointestinal: Negative.  Negative for abdominal distention, constipation, diarrhea, nausea and vomiting.  Genitourinary: Positive for dysuria, frequency and urgency.  Musculoskeletal: Positive for myalgias.  Skin: Negative.   Neurological: Negative.       Objective:   Physical Exam  Constitutional: She is oriented to person, place, and time. She appears well-developed and well-nourished.  HENT:  Head: Normocephalic and atraumatic.  Oropharynx with redness and clear drainage, nose with swollen turbinates, TMs normal bilaterally  Eyes: EOM are normal.  Neck: Normal range of motion. No thyromegaly present.  Cardiovascular: Normal rate and regular rhythm.  Pulmonary/Chest: Effort normal. No respiratory distress. She has wheezes. She has no rales.  Mild wheezing on the left expiratory  Abdominal: Soft. She exhibits no  distension. There is no tenderness. There is no rebound.  Musculoskeletal: She exhibits no edema or tenderness.  Lymphadenopathy:    She has no cervical adenopathy.  Neurological: She is alert and oriented to person, place, and time.  Skin: Skin is warm and dry.   Vitals:   07/28/17 1110  BP: 136/76  Pulse: (!) 59  Temp: 98.6 F (37 C)  TempSrc: Oral  SpO2: 97%  Weight: 238 lb (108 kg)  Height: 5\' 6"  (1.676 m)   POC flu: negative    Assessment & Plan:

## 2017-07-28 NOTE — Patient Instructions (Signed)
We have refilled the tessalon perles to use for cough.  We have sent in prednisone to take 2 pills daily for 5 days to help with the drainage.

## 2017-07-29 NOTE — Assessment & Plan Note (Signed)
U/A done in the office without signs of infection. Advised that dark urine or change in smell can be caused by hydration which can change when sick. Increase hydration.

## 2017-07-29 NOTE — Assessment & Plan Note (Signed)
POC flu negative. Rx for prednisone and encouraged to continue otc allergy medications zyrtec and cold meds if needed. Call back if not improved.

## 2017-08-10 ENCOUNTER — Other Ambulatory Visit: Payer: Self-pay | Admitting: Internal Medicine

## 2017-08-10 NOTE — Telephone Encounter (Signed)
Control database checked last refill: 06/28/2017 LOV: 07/28/2017 acute visit

## 2017-08-11 NOTE — Telephone Encounter (Signed)
Needs visit to assess pain medicine need. Will fill for 7 day supply until visit. Please call and schedule.

## 2017-08-26 ENCOUNTER — Ambulatory Visit: Payer: Medicare HMO | Admitting: Internal Medicine

## 2017-09-05 ENCOUNTER — Ambulatory Visit (INDEPENDENT_AMBULATORY_CARE_PROVIDER_SITE_OTHER): Payer: Medicare HMO | Admitting: Family

## 2017-09-05 ENCOUNTER — Other Ambulatory Visit: Payer: Medicare HMO

## 2017-09-05 ENCOUNTER — Encounter: Payer: Self-pay | Admitting: Family

## 2017-09-05 VITALS — BP 150/90 | HR 65 | Temp 98.6°F | Ht 66.0 in

## 2017-09-05 DIAGNOSIS — R350 Frequency of micturition: Secondary | ICD-10-CM

## 2017-09-05 DIAGNOSIS — M5440 Lumbago with sciatica, unspecified side: Secondary | ICD-10-CM | POA: Diagnosis not present

## 2017-09-05 DIAGNOSIS — G8929 Other chronic pain: Secondary | ICD-10-CM | POA: Diagnosis not present

## 2017-09-05 LAB — POC URINALSYSI DIPSTICK (AUTOMATED)
Bilirubin, UA: NEGATIVE
GLUCOSE UA: NEGATIVE
Ketones, UA: NEGATIVE
Nitrite, UA: NEGATIVE
Protein, UA: POSITIVE — AB
RBC UA: NEGATIVE
SPEC GRAV UA: 1.025 (ref 1.010–1.025)
Urobilinogen, UA: 0.2 E.U./dL
pH, UA: 6 (ref 5.0–8.0)

## 2017-09-05 MED ORDER — TRAMADOL HCL 50 MG PO TABS: 50.0000 mg | ORAL_TABLET | Freq: Four times a day (QID) | ORAL | 0 refills | Status: DC

## 2017-09-05 MED ORDER — NITROFURANTOIN MONOHYD MACRO 100 MG PO CAPS: 100.0000 mg | ORAL_CAPSULE | Freq: Two times a day (BID) | ORAL | 0 refills | Status: DC

## 2017-09-05 NOTE — Progress Notes (Signed)
[name] is a 62 y.o. female with the following history as recorded in [employer]:  Patient Active Problem List   Diagnosis Date Noted  . Cavovarus deformity of foot, acquired, unspecified laterality 04/07/2017  . Acute kidney injury ([place]) 12/19/2016  . S/P gastric bypass 08/18/2016  . Muscle cramps 07/09/2016  . Dysuria 05/26/2016  . Adjustment disorder with mixed anxiety and depressed mood 03/05/2016  . Weakness 03/05/2016  . Cervical disc disorder with radiculopathy of cervical region 02/27/2016  . Degenerative arthritis of right knee 02/27/2016  . Right knee pain 02/24/2016  . Left shoulder pain 02/24/2016  . Cough 02/24/2016  . Routine general medical examination at a health care facility 12/12/2015  . Schatzki's ring   . Lumbar radiculopathy 03/05/2015  . Peripheral neuropathy 01/17/2015  . Fibromyalgia 02/21/2011  . Essential hypertension 12/04/2006  . Hx of diabetes mellitus 10/20/2006  . Morbid obesity ([place]) 10/20/2006  . MDD (major depressive disorder) ([place]) 10/20/2006  . OBSTRUCTIVE SLEEP APNEA 10/20/2006  . OSTEOARTHRITIS 10/20/2006  . DVT, HX OF 10/20/2006    Current Outpatient Medications  Medication Sig Dispense Refill  . ACCU-CHEK AVIVA PLUS test strip USE  TO CHECK BLOOD SUGAR TWICE DAILY 200 each 2  . ACCU-CHEK SOFTCLIX LANCETS lancets CHECK BLOOD SUGARS TWICE DAILY 200 each 2  . albuterol (PROVENTIL HFA;VENTOLIN HFA) 108 (90 Base) MCG/ACT inhaler Inhale 1-2 puffs into the lungs every 6 (six) hours as needed for wheezing or shortness of breath. 1 Inhaler 2  . Alcohol Swabs (B-D SINGLE USE SWABS REGULAR) PADS USE TO WIPE AREA TO CHECK BLOOD SUGAR DAILY  100 each 2  . aspirin EC 81 MG tablet Take 81 mg by mouth daily.    . benzonatate (TESSALON) 200 MG capsule Take 1 capsule (200 mg total) by mouth 3 (three) times daily as needed. 60 capsule 0  . BLACK COHOSH PO Take 1 tablet by mouth daily.    . Blood Glucose Calibration (ACCU-CHEK AVIVA) SOLN Use as  directed 3 each 3  . Blood Glucose Monitoring Suppl (ACCU-CHEK AVIVA PLUS) w/Device KIT Use as directed to check blood sugars daily Dx E11.9 1 kit 0  . buPROPion (WELLBUTRIN SR) 150 MG 12 hr tablet Take 1 tablet (150 mg total) by mouth 2 (two) times daily. 60 tablet 6  . CALCIUM PO Take 1 tablet by mouth daily. '600mg'$     . escitalopram (LEXAPRO) 10 MG tablet Take 1 tablet (10 mg total) by mouth daily. 90 tablet 0  . furosemide (LASIX) 40 MG tablet TAKE 1 TABLET DAILY AS NEEDED FOR EDEMA 90 tablet 3  . gabapentin (NEURONTIN) 400 MG capsule TAKE 1 CAPSULE FOUR TIMES DAILY 360 capsule 3  . Multiple Vitamins-Minerals (MULTIVITAMINS THER. W/MINERALS) TABS Take 1 tablet by mouth daily.     . nitrofurantoin, macrocrystal-monohydrate, (MACROBID) 100 MG capsule Take 1 capsule (100 mg total) by mouth 2 (two) times daily. 14 capsule 0  . nitroGLYCERIN (NITROSTAT) 0.4 MG SL tablet Place 0.4 mg under the tongue every 5 (five) minutes as needed for chest pain.     Marland Kitchen ondansetron (ZOFRAN) 4 MG tablet Take 1 tablet (4 mg total) by mouth every 8 (eight) hours as needed for nausea or vomiting. 20 tablet 1  . pantoprazole (PROTONIX) 40 MG tablet Take 1 tablet (40 mg total) by mouth daily before breakfast. 30 tablet 11  . promethazine (PHENERGAN) 12.5 MG tablet Take 1 tablet (12.5 mg total) by mouth every 6 (six) hours as needed for nausea or  vomiting. 20 tablet 0  . saccharomyces boulardii (FLORASTOR) 250 MG capsule Take 1 capsule (250 mg total) by mouth 2 (two) times daily. 30 capsule 0  . traMADol (ULTRAM) 50 MG tablet Take 1 tablet (50 mg total) by mouth 4 (four) times daily. Needs visit for any more refills 30 tablet 0  . VITAMIN D, CHOLECALCIFEROL, PO Take 1,200 Units by mouth daily.     No current facility-administered medications for this visit.     Allergies: Carafate [sucralfate] and Sulfonamide derivatives  Past Medical History:  Diagnosis Date  . Anemia    takes Ferrous Sulfate daily  . Anxiety     takes Citalopram daily  . Arthritis   . Asthma    2004-prior to gastric bypass and no problems since  . CHF (congestive heart failure) ([facility])    takes Lasix daily as needed  . Chronic back pain    spondylolisthesis/stenosis/radiculopathy  . Complication of anesthesia yrs ago   slow to wake up  . Depression   . Diabetes ([place])   . DVT (deep venous thrombosis) ([place]) 01-17-13   past hx. -tx.5-6 yrs ago bilateral legs, occ. sporadic swelling, has IVC filter implanted  . Dysrhythmia    "heart tends to flutter"  . Fibromyalgia   . Fracture    right foot and is in a cam boot  . Gallstones   . GERD (gastroesophageal reflux disease)    hx of-no meds now  . Heart murmur   . History of bronchitis 2012 or 2013  . History of colon polyps   . Hypertension    takes Metoprolol daily  . Insomnia    takes Melatonin daily  . Joint pain   . Joint swelling   . Pelvic floor dysfunction   . Peripheral neuropathy   . Pneumonia 90's   hx of  . S/P gastric bypass   . Sleep apnea    no cpap used in many yrs after weight lost-no machine now  . Urinary frequency   . Urinary urgency   . Vitamin D deficiency     Past Surgical History:  Procedure Laterality Date  . BALLOON DILATION N/A 01/31/2013   Procedure: BALLOON DILATION;  Surgeon: Arta Silence, MD;  Location: [facility];  Service: Endoscopy;  Laterality: N/A;  . CHOLECYSTECTOMY  2007  . COLONOSCOPY    . DILATION AND CURETTAGE OF UTERUS  yrs ago  . ESOPHAGOGASTRODUODENOSCOPY (EGD) WITH PROPOFOL  03/01/2012   Procedure: ESOPHAGOGASTRODUODENOSCOPY (EGD) WITH PROPOFOL;  Surgeon: Arta Silence, MD;  Location: [facility];  Service: Endoscopy;  Laterality: N/A;  . ESOPHAGOGASTRODUODENOSCOPY (EGD) WITH PROPOFOL N/A 01/31/2013   Procedure: ESOPHAGOGASTRODUODENOSCOPY (EGD) WITH PROPOFOL;  Surgeon: Arta Silence, MD;  Location: [facility];  Service: Endoscopy;  Laterality: N/A;  . ESOPHAGOGASTRODUODENOSCOPY (EGD) WITH PROPOFOL N/A 09/02/2015    Procedure: ESOPHAGOGASTRODUODENOSCOPY (EGD) WITH PROPOFOL;  Surgeon: Gatha Mayer, MD;  Location: [facility];  Service: Endoscopy;  Laterality: N/A;  . GASTRIC BY-PASS  2004  . HERNIA REPAIR  2005  . INSERTION OF VENA CAVA FILTER  01-17-13   inserted 2004- "abdomen"  . LIGAMENT REPAIR Right 1987   Rt. knee scope  . MAXIMUM ACCESS (MAS)POSTERIOR LUMBAR INTERBODY FUSION (PLIF) 2 LEVEL N/A 06/07/2014   Procedure: L4-5 L5-S1 FOR MAXIMUM ACCESS (MAS) POSTERIOR LUMBAR INTERBODY FUSION ;  Surgeon: Erline Levine, MD;  Location: [facility];  Service: Neurosurgery;  Laterality: N/A;  L4-5 L5-S1 FOR MAXIMUM ACCESS (MAS) POSTERIOR LUMBAR INTERBODY FUSION   . TONSILLECTOMY  as child    Family History  Problem Relation Age of Onset  . Diabetes Mother   . Heart disease Father   . Hypertension Father   . Prostate cancer Father   . Kidney disease Brother   . Colon cancer Neg Hx   . Colon polyps Neg Hx   . Stomach cancer Neg Hx   . Esophageal cancer Neg Hx   . Pancreatic cancer Neg Hx   . Liver disease Neg Hx     Social History   Tobacco Use  . Smoking status: Former Smoker    Packs/day: 0.50    Years: 10.00    Pack years: 5.00    Types: Cigarettes    Last attempt to quit: 04/19/2009    Years since quitting: 8.3  . Smokeless tobacco: Never Used  Substance Use Topics  . Alcohol use: Yes    Alcohol/week: 0.0 oz    Comment: occ. 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 Notes that she is continuing to have persisting tingling  sensation; on Neurontin 4 x per day; wonders about short-term refill on Tramadol today;   Objective:  Vitals:   09/05/17 1556  BP: (!) 150/90  Pulse: 65  Temp: 98.6 F (37 C)  TempSrc: Oral  SpO2: 96%  Height: '5\' 6"'$  (1.676 m)    General: Well developed, well nourished, in no acute distress  Skin : Warm and dry.  Head: Normocephalic and atraumatic  Lungs: Respirations unlabored; clear to auscultation bilaterally without wheeze, rales, rhonchi  Musculoskeletal: No deformities; no active joint inflammation; negative CVA tenderness Extremities: No edema, cyanosis, clubbing  Vessels: Symmetric bilaterally  Neurologic: Alert and oriented; speech intact; face symmetrical; moves all extremities well; CNII-XII intact without focal deficit  Assessment:  1. Urinary frequency   2. Chronic low back pain with sciatica, sciatica laterality unspecified, unspecified back pain laterality     Plan:  1. Check U/A and urine culture today; Rx for Macrobid 100 mg bid x 7 days; follow-up as needed based on culture; 2. She will call back if/ when she decides she would like to see a specialist; feel she might benefit from injections; may need to consider 2nd opinion with different neurosurgeon. Tramadol #30 given today;   No follow-ups on file.  Orders Placed This Encounter  Procedures  . Urine Culture    Standing Status:   Future    Number of Occurrences:   1    Standing Expiration Date:   09/05/2018  . POCT Urinalysis Dipstick (Automated)    Requested Prescriptions   Signed Prescriptions Disp Refills  . nitrofurantoin, macrocrystal-monohydrate, (MACROBID) 100 MG capsule 14 capsule 0    Sig: Take 1 capsule (100 mg total) by mouth 2 (two) times daily.  . traMADol (ULTRAM) 50 MG tablet 30 tablet 0    Sig: Take 1 tablet (50 mg total) by mouth 4 (four) times daily. Needs visit for any more refills

## 2017-09-06 ENCOUNTER — Ambulatory Visit: Payer: Medicare HMO | Admitting: Internal Medicine

## 2017-09-07 LAB — URINE CULTURE
MICRO NUMBER: 90610475
SPECIMEN QUALITY: ADEQUATE

## 2017-09-13 ENCOUNTER — Telehealth: Payer: Self-pay

## 2017-09-13 ENCOUNTER — Ambulatory Visit: Payer: Medicare HMO | Admitting: Internal Medicine

## 2017-09-13 NOTE — Telephone Encounter (Signed)
Copied from [place] [id number]. Topic: General - Other >> Sep 09, 2017  3:16 PM Carolyn Stare wrote:  Pt call to say she does not think the below med is working and would like a call back    nitrofurantoin, macrocrystal-monohydrate, (MACROBID) 100 MG capsule   [phone]

## 2017-09-13 NOTE — Telephone Encounter (Signed)
Spoke with patient and info given. She will keep appointment today with Dr. Sharlet Salina this afternoon.

## 2017-09-13 NOTE — Telephone Encounter (Signed)
Please advise. Thanks.  

## 2017-09-13 NOTE — Telephone Encounter (Signed)
Patient last saw laura, I do see that she has made an appointment today at 4pm with Dr. Sharlet Salina.

## 2017-09-13 NOTE — Telephone Encounter (Signed)
According to culture, it was appropriate treatment; keep appt with Dr. Sharlet Salina today if not feeling any better.

## 2017-09-15 ENCOUNTER — Other Ambulatory Visit (INDEPENDENT_AMBULATORY_CARE_PROVIDER_SITE_OTHER): Payer: Medicare HMO

## 2017-09-15 ENCOUNTER — Encounter: Payer: Self-pay | Admitting: Internal Medicine

## 2017-09-15 ENCOUNTER — Ambulatory Visit (INDEPENDENT_AMBULATORY_CARE_PROVIDER_SITE_OTHER): Payer: Medicare HMO | Admitting: Internal Medicine

## 2017-09-15 VITALS — BP 132/84 | HR 63 | Temp 98.3°F | Ht 66.0 in | Wt 251.0 lb

## 2017-09-15 DIAGNOSIS — M5416 Radiculopathy, lumbar region: Secondary | ICD-10-CM

## 2017-09-15 DIAGNOSIS — R300 Dysuria: Secondary | ICD-10-CM

## 2017-09-15 DIAGNOSIS — R531 Weakness: Secondary | ICD-10-CM

## 2017-09-15 DIAGNOSIS — R29818 Other symptoms and signs involving the nervous system: Secondary | ICD-10-CM

## 2017-09-15 LAB — COMPREHENSIVE METABOLIC PANEL
ALK PHOS: 154 U/L — AB (ref 39–117)
ALT: 17 U/L (ref 0–35)
AST: 17 U/L (ref 0–37)
Albumin: 3.9 g/dL (ref 3.5–5.2)
BUN: 12 mg/dL (ref 6–23)
CHLORIDE: 115 meq/L — AB (ref 96–112)
CO2: 20 meq/L (ref 19–32)
Calcium: 8.3 mg/dL — ABNORMAL LOW (ref 8.4–10.5)
Creatinine, Ser: 0.7 mg/dL (ref 0.40–1.20)
GFR: 108.37 mL/min (ref >60.00)
GLUCOSE: 82 mg/dL (ref 70–99)
POTASSIUM: 3.6 meq/L (ref 3.5–5.1)
Sodium: 142 mEq/L (ref 135–145)
TOTAL PROTEIN: 6.7 g/dL (ref 6.0–8.3)
Total Bilirubin: 0.3 mg/dL (ref 0.2–1.2)

## 2017-09-15 LAB — POCT URINALYSIS DIPSTICK
BILIRUBIN UA: NEGATIVE
GLUCOSE UA: NEGATIVE
Ketones, UA: NEGATIVE
Nitrite, UA: NEGATIVE
PH UA: 6 (ref 5.0–8.0)
Protein, UA: NEGATIVE
RBC UA: NEGATIVE
Spec Grav, UA: 1.025 (ref 1.010–1.025)
Urobilinogen, UA: 0.2 E.U./dL

## 2017-09-15 LAB — CBC
HEMATOCRIT: 36.8 % (ref 36.0–46.0)
HEMOGLOBIN: 11.6 g/dL — AB (ref 12.0–15.0)
MCHC: 31.5 g/dL (ref 30.0–36.0)
MCV: 84.9 fl (ref 78.0–100.0)
PLATELETS: 191 10*3/uL (ref 150.0–400.0)
RBC: 4.33 Mil/uL (ref 3.87–5.11)
RDW: 17.1 % — ABNORMAL HIGH (ref 11.5–15.5)
WBC: 8 10*3/uL (ref 4.0–10.5)

## 2017-09-15 LAB — CK: Total CK: 108 U/L (ref 7–177)

## 2017-09-15 LAB — FERRITIN: FERRITIN: 10.7 ng/mL (ref 10.0–291.0)

## 2017-09-15 LAB — TSH: TSH: 0.83 u[IU]/mL (ref 0.35–4.50)

## 2017-09-15 LAB — VITAMIN D 25 HYDROXY (VIT D DEFICIENCY, FRACTURES)

## 2017-09-15 LAB — VITAMIN B12

## 2017-09-15 MED ORDER — TRAMADOL HCL 50 MG PO TABS: 50.0000 mg | ORAL_TABLET | Freq: Four times a day (QID) | ORAL | 3 refills | Status: DC

## 2017-09-15 MED ORDER — CIPROFLOXACIN HCL 500 MG PO TABS: 500.0000 mg | ORAL_TABLET | Freq: Two times a day (BID) | ORAL | 0 refills | Status: DC

## 2017-09-15 NOTE — Assessment & Plan Note (Signed)
It is unclear how much the lumbar and cervical radiculopathy is playing into her current symptoms. Some falls recently but no new back or neck injury.

## 2017-09-15 NOTE — Addendum Note (Signed)
Addended by: Raford Pitcher R on: 09/15/2017 03:49 PM   Modules accepted: Orders

## 2017-09-15 NOTE — Patient Instructions (Addendum)
We are checking the urine today and the blood work and will get the MRI of the brain.   We have sent in ciprofloxacin to take for possible urine infection that will treat the kidneys as well. Take 1 pill twice a day for 5 days.

## 2017-09-15 NOTE — Assessment & Plan Note (Signed)
U/A in the office consistent with persistent infection and cipro sent in. Culture sent off to make sure of sensitivity.

## 2017-09-15 NOTE — Assessment & Plan Note (Signed)
Given the significant changes will check for myasthenia gravis, MRI brain to rule out MS. Checking labs for any muscle breakdown or electrolyte problems. Checking thyroid and vitamin levels as well. If no answers will refer to neurology for more extensive testing.

## 2017-09-15 NOTE — Progress Notes (Signed)
   Subjective:    Patient ID: [name], female    DOB: 1956-03-10, 62 y.o.   MRN: [id number]  HPI The patient is a 62 YO female coming in for worsening problems with weakness. She has had MRI in the past with some features concerning for MS versus vascular changes. She is having a lot of weakness especially in her arms and hands. She is having more weakness as she does activities. She does have some cervical and lumbar stenosis which in 2017 had worsened some. She does have falls due to the weakness. She is barely able to take care of herself which is a significant change in the last month. She was just treated for UTI and does not feel 100% better from that. She is still having some increased urination. She denies fevers or chills. She denies muscle wasting. She is gaining weight and does not know why. Does also have some vision changes and just got new glasses which helped for some time. Lately her glasses are fine sometimes and not others. She has some blurring of the vision.   Review of Systems  Constitutional: Positive for activity change, appetite change, fatigue and unexpected weight change.  HENT: Negative.   Eyes: Negative.   Respiratory: Negative for cough, chest tightness and shortness of breath.   Cardiovascular: Negative for chest pain, palpitations and leg swelling.  Gastrointestinal: Positive for abdominal pain and diarrhea. Negative for abdominal distention, constipation, nausea and vomiting.       Chronic  Musculoskeletal: Positive for arthralgias, gait problem and myalgias.  Skin: Negative.   Neurological: Positive for weakness and numbness.  Psychiatric/Behavioral: Positive for decreased concentration and dysphoric mood.      Objective:   Physical Exam  Constitutional: She is oriented to person, place, and time. She appears well-developed and well-nourished.  overweight  HENT:  Head: Normocephalic and atraumatic.  Eyes: EOM are normal.  Neck: Normal range of  motion.  Cardiovascular: Normal rate and regular rhythm.  Pulmonary/Chest: Effort normal and breath sounds normal. No respiratory distress. She has no wheezes. She has no rales.  Abdominal: Soft. Bowel sounds are normal. She exhibits no distension. There is no tenderness. There is no rebound.  Musculoskeletal: She exhibits no edema.  Neurological: She is alert and oriented to person, place, and time. Coordination abnormal.  Muscle strength goes down with repetitive motions.  Skin: Skin is warm and dry.  Psychiatric:  Distressed during the visit   Vitals:   09/15/17 1451  BP: 132/84  Pulse: 63  Temp: 98.3 F (36.8 C)  TempSrc: Oral  SpO2: 99%  Weight: 251 lb (113.9 kg)  Height: 5\' 6"  (1.676 m)      Assessment & Plan:

## 2017-09-16 LAB — URINE CULTURE
MICRO NUMBER:: 90652458
SPECIMEN QUALITY: ADEQUATE

## 2017-09-21 ENCOUNTER — Encounter: Payer: Self-pay | Admitting: Internal Medicine

## 2017-09-21 ENCOUNTER — Other Ambulatory Visit: Payer: Self-pay | Admitting: Family

## 2017-09-21 DIAGNOSIS — R7989 Other specified abnormal findings of blood chemistry: Secondary | ICD-10-CM

## 2017-09-21 DIAGNOSIS — R945 Abnormal results of liver function studies: Secondary | ICD-10-CM

## 2017-09-21 DIAGNOSIS — R748 Abnormal levels of other serum enzymes: Secondary | ICD-10-CM

## 2017-09-21 LAB — ACETYLCHOLINE RECEPTOR, BINDING

## 2017-09-21 LAB — STRIATED MUSCLE ANTIBODY: STRIATED MUSCLE AB SCREEN: NEGATIVE

## 2017-09-21 MED ORDER — VITAMIN D (ERGOCALCIFEROL) 1.25 MG (50000 UNIT) PO CAPS: 50000.0000 [IU] | ORAL_CAPSULE | ORAL | 0 refills | Status: AC

## 2017-09-26 ENCOUNTER — Encounter: Payer: Self-pay | Admitting: Neurology

## 2017-09-26 NOTE — Telephone Encounter (Signed)
FYI: Tried to call patient but no answer. Then I saw her mychart messages. Please advise if there is anything in addition for me to do.

## 2017-09-27 ENCOUNTER — Encounter: Payer: Self-pay | Admitting: Internal Medicine

## 2017-09-27 ENCOUNTER — Telehealth: Payer: Self-pay

## 2017-09-27 NOTE — Telephone Encounter (Signed)
PA started on CoverMyMeds KEY: [id number]

## 2017-10-13 ENCOUNTER — Ambulatory Visit
Admission: RE | Admit: 2017-10-13 | Discharge: 2017-10-13 | Disposition: A | Discharge status: Home / Self Care | Payer: Medicare HMO | Source: Ambulatory Visit | Attending: Family | Admitting: Family

## 2017-10-13 DIAGNOSIS — R7989 Other specified abnormal findings of blood chemistry: Secondary | ICD-10-CM

## 2017-10-13 DIAGNOSIS — R945 Abnormal results of liver function studies: Principal | ICD-10-CM

## 2017-10-13 DIAGNOSIS — I714 Abdominal aortic aneurysm, without rupture: Secondary | ICD-10-CM | POA: Diagnosis not present

## 2017-10-18 ENCOUNTER — Encounter: Payer: Self-pay | Admitting: Internal Medicine

## 2017-10-18 ENCOUNTER — Telehealth: Payer: Self-pay

## 2017-10-18 DIAGNOSIS — E559 Vitamin D deficiency, unspecified: Secondary | ICD-10-CM

## 2017-10-18 MED ORDER — BUPROPION HCL ER (SR) 150 MG PO TB12: 150.0000 mg | ORAL_TABLET | Freq: Two times a day (BID) | ORAL | 3 refills | Status: DC

## 2017-10-18 NOTE — Telephone Encounter (Signed)
Message sent via my chart to patient regarding coming back after completion of 12 weeks on Vitamin D.

## 2017-10-18 NOTE — Telephone Encounter (Signed)
Pt. Given results of abdominal ultrasound and instructions. States she thinks she is supposed to have Vit D rechecked in 3 months. Does she need OV or just labs? Needs new prescription for her Wellbutrin sent to [facility].

## 2017-10-18 NOTE — Telephone Encounter (Signed)
Also called and left message for patient as well and created CRM incase she calls back.

## 2017-10-18 NOTE — Telephone Encounter (Signed)
Please advise. Thanks.  

## 2017-10-18 NOTE — Telephone Encounter (Signed)
She can come get Vitamin D without OV; lab order in place;

## 2017-10-18 NOTE — Telephone Encounter (Signed)
I sent in the wellbutrin, but patient saw Mickel Baas for the labs I see that she wanted Pt to come back and get levels checked for Vit D. Does she want patient to make a follow up with PCP or can labs be put in and patient can just go to the lab and get them drawn without appointment?

## 2017-10-19 ENCOUNTER — Encounter: Payer: Self-pay | Admitting: Internal Medicine

## 2017-10-26 ENCOUNTER — Ambulatory Visit
Admission: RE | Admit: 2017-10-26 | Discharge: 2017-10-26 | Disposition: A | Discharge status: Home / Self Care | Payer: Medicare HMO | Source: Ambulatory Visit | Attending: Internal Medicine | Admitting: Internal Medicine

## 2017-10-26 DIAGNOSIS — R29818 Other symptoms and signs involving the nervous system: Secondary | ICD-10-CM

## 2017-10-26 DIAGNOSIS — M531 Cervicobrachial syndrome: Secondary | ICD-10-CM | POA: Diagnosis not present

## 2017-10-27 ENCOUNTER — Encounter: Payer: Self-pay | Admitting: Internal Medicine

## 2017-11-04 ENCOUNTER — Ambulatory Visit: Payer: Medicare HMO | Admitting: Neurology

## 2017-12-08 ENCOUNTER — Other Ambulatory Visit: Payer: Self-pay | Admitting: Internal Medicine

## 2017-12-12 ENCOUNTER — Ambulatory Visit (INDEPENDENT_AMBULATORY_CARE_PROVIDER_SITE_OTHER): Payer: Medicare HMO | Admitting: Internal Medicine

## 2017-12-12 ENCOUNTER — Ambulatory Visit: Payer: Self-pay | Admitting: Internal Medicine

## 2017-12-12 VITALS — BP 152/90 | HR 60 | Temp 98.4°F | Wt 250.0 lb

## 2017-12-12 DIAGNOSIS — M79661 Pain in right lower leg: Secondary | ICD-10-CM | POA: Diagnosis not present

## 2017-12-12 DIAGNOSIS — I10 Essential (primary) hypertension: Secondary | ICD-10-CM

## 2017-12-12 DIAGNOSIS — Z9884 Bariatric surgery status: Secondary | ICD-10-CM | POA: Diagnosis not present

## 2017-12-12 DIAGNOSIS — Z6841 Body Mass Index (BMI) 40.0 and over, adult: Secondary | ICD-10-CM | POA: Diagnosis not present

## 2017-12-12 DIAGNOSIS — Z86718 Personal history of other venous thrombosis and embolism: Secondary | ICD-10-CM

## 2017-12-12 DIAGNOSIS — M7989 Other specified soft tissue disorders: Secondary | ICD-10-CM | POA: Diagnosis not present

## 2017-12-12 MED ORDER — DOXYCYCLINE HYCLATE 100 MG PO TABS: 100.0000 mg | ORAL_TABLET | Freq: Two times a day (BID) | ORAL | 0 refills | Status: DC

## 2017-12-12 NOTE — Patient Instructions (Addendum)
Getting  Ultrasound of the leg to check for clotting    consideration of infection also .   If positive  We will need to put you on a blood thinner   And FU with your PCP.  Elevate,  Warm  compresses  ASA .  325 mg  And add antibiotic  If getting worse seek care in ED.

## 2017-12-12 NOTE — Telephone Encounter (Signed)
She called in c/o right calf being swollen, red, warm to the touch and painful worse today.    She has a history of blood clots the last one being in 2005.    See triage notes.  There were no available providers at the [facility] office so I was able to schedule her at the [facility] office with Dr. Regis Bill today at 3:30.      I instructed pt to call 911 if she experienced chest pain or shortness of breath.   She verbalized understanding and thanked me for getting her an appt today at another office.  I routed this note to the [place] office. Reason for Disposition . [1] Thigh or calf pain AND [2] only 1 side AND [3] present > 1 hour  Answer Assessment - Initial Assessment Questions 1. ONSET: "When did the swelling start?" (e.g., minutes, hours, days)     It's in the calf of my leg.   It's red.  Right leg.   I've been taking my Lasix and wearing my compression stockings.   It's warm to the touch. 2. LOCATION: "What part of the leg is swollen?"  "Are both legs swollen or just one leg?"   Right leg is swollen.    Calf up to the knee.     Has history of blood clots in both of legs.   3. SEVERITY: "How bad is the swelling?" (e.g., localized; mild, moderate, severe)  - Localized - small area of swelling localized to one leg  - MILD pedal edema - swelling limited to foot and ankle, pitting edema < 1/4 inch (6 mm) deep, rest and elevation eliminate most or all swelling  - MODERATE edema - swelling of lower leg to knee, pitting edema > 1/4 inch (6 mm) deep, rest and elevation only partially reduce swelling  - SEVERE edema - swelling extends above knee, facial or hand swelling present      It's about 3/4 size of my other leg. 4. REDNESS: "Does the swelling look red or infected?"     Red and warm to the touch. 5. PAIN: "Is the swelling painful to touch?" If so, ask: "How painful is it?"   (Scale 1-10; mild, moderate or severe)    5-6 on pain scale.   It hurts more when I stand on it.   It's better  when I elevated it. 6. FEVER: "Do you have a fever?" If so, ask: "What is it, how was it measured, and when did it start?"      No 7. CAUSE: "What do you think is causing the leg swelling?"     Blood clot 8. MEDICAL HISTORY: "Do you have a history of heart failure, kidney disease, liver failure, or cancer?"     Kidney and some CHF 9. RECURRENT SYMPTOM: "Have you had leg swelling before?" If so, ask: "When was the last time?" "What happened that time?"     2005 last time I had a clot.    I've gained a lot of weight.   Maybe caused this problem.    10. OTHER SYMPTOMS: "Do you have any other symptoms?" (e.g., chest pain, difficulty breathing)       No.    When I lay down I do due to the CHF have difficulty breathing.    I sleep on 3 pillows which is normal for me. 11. PREGNANCY: "Is there any chance you are pregnant?" "When was your last menstrual period?"       Not  asked due to age.  Protocols used: LEG SWELLING AND EDEMA-A-AH

## 2017-12-12 NOTE — Progress Notes (Signed)
Chief Complaint  Patient presents with  . Acute Visit    right lower leg painful and red and warm to touch since last week.      HPI: [name] 62 y.o.  SDA   PCP at [facility] NA  come in for    Above    Right leg boteherin her for a week or so but then in last days got wearms and tender and red . And mores swollen   Yesterday . No injury x flea bites and no falling . Remote hx of dvt felt from   Being overweight  In 2004 or thereabouts  Hx of dvt in both legs at one time or another  Ht med acei controlled  Says bg doing ok   Had gastric bypass and  Had low weight to 215 and recently  back up .  And went back on  Since 2-3 year ago .   lisinopril   5 mg per day . ROS: See pertinent positives and negatives per HPI. No fever chills neuro gi sx new  No cough ur sx sob cp   Past Medical History:  Diagnosis Date  . Anemia    takes Ferrous Sulfate daily  . Anxiety    takes Citalopram daily  . Arthritis   . Asthma    2004-prior to gastric bypass and no problems since  . CHF (congestive heart failure) (HCC)    takes Lasix daily as needed  . Chronic back pain    spondylolisthesis/stenosis/radiculopathy  . Complication of anesthesia yrs ago   slow to wake up  . Depression   . Diabetes ([place])   . DVT (deep venous thrombosis) ([place]) 01-17-13   past hx. -tx.5-6 yrs ago bilateral legs, occ. sporadic swelling, has IVC filter implanted  . Dysrhythmia    "heart tends to flutter"  . Fibromyalgia   . Fracture    right foot and is in a cam boot  . Gallstones   . GERD (gastroesophageal reflux disease)    hx of-no meds now  . Heart murmur   . History of bronchitis 2012 or 2013  . History of colon polyps   . Hypertension    takes Metoprolol daily  . Insomnia    takes Melatonin daily  . Joint pain   . Joint swelling   . Pelvic floor dysfunction   . Peripheral neuropathy   . Pneumonia 90's   hx of  . S/P gastric bypass   . Sleep apnea    no cpap used in many yrs after weight  lost-no machine now  . Urinary frequency   . Urinary urgency   . Vitamin D deficiency     Family History  Problem Relation Age of Onset  . Diabetes Mother   . Heart disease Father   . Hypertension Father   . Prostate cancer Father   . Kidney disease Brother   . Colon cancer Neg Hx   . Colon polyps Neg Hx   . Stomach cancer Neg Hx   . Esophageal cancer Neg Hx   . Pancreatic cancer Neg Hx   . Liver disease Neg Hx     Social History   Socioeconomic History  . Marital status: Widowed    Spouse name: Not on file  . Number of children: 0  . Years of education: college  . Highest education level: Not on file  Occupational History  . Occupation: retired  Scientific laboratory technician  . Financial resource strain: Not on  file  . Food insecurity:    Worry: Not on file    Inability: Not on file  . Transportation needs:    Medical: Not on file    Non-medical: Not on file  Tobacco Use  . Smoking status: Former Smoker    Packs/day: 0.50    Years: 10.00    Pack years: 5.00    Types: Cigarettes    Last attempt to quit: 04/19/2009    Years since quitting: 8.6  . Smokeless tobacco: Never Used  Substance and Sexual Activity  . Alcohol use: Yes    Alcohol/week: 0.0 standard drinks    Comment: occ. social- wine-1 drink monthly  . Drug use: No  . Sexual activity: Not Currently  Lifestyle  . Physical activity:    Days per week: Not on file    Minutes per session: Not on file  . Stress: Not on file  Relationships  . Social connections:    Talks on phone: Not on file    Gets together: Not on file    Attends religious service: Not on file    Active member of club or organization: Not on file    Attends meetings of clubs or organizations: Not on file    Relationship status: Not on file  Other Topics Concern  . Not on file  Social History Narrative  . Not on file    Outpatient Medications Prior to Visit  Medication Sig Dispense Refill  . ACCU-CHEK AVIVA PLUS test strip USE  TO CHECK BLOOD  SUGAR TWICE DAILY 200 each 2  . ACCU-CHEK SOFTCLIX LANCETS lancets CHECK BLOOD SUGARS TWICE DAILY 200 each 2  . albuterol (PROVENTIL HFA;VENTOLIN HFA) 108 (90 Base) MCG/ACT inhaler Inhale 1-2 puffs into the lungs every 6 (six) hours as needed for wheezing or shortness of breath. 1 Inhaler 2  . Alcohol Swabs (B-D SINGLE USE SWABS REGULAR) PADS USE TO WIPE AREA TO CHECK BLOOD SUGAR DAILY  100 each 2  . aspirin EC 81 MG tablet Take 81 mg by mouth daily.    Marland Kitchen BLACK COHOSH PO Take 1 tablet by mouth daily.    . Blood Glucose Calibration (ACCU-CHEK AVIVA) SOLN Use as directed 3 each 3  . Blood Glucose Monitoring Suppl (ACCU-CHEK AVIVA PLUS) w/Device KIT Use as directed to check blood sugars daily Dx E11.9 1 kit 0  . buPROPion (WELLBUTRIN SR) 150 MG 12 hr tablet Take 1 tablet (150 mg total) by mouth 2 (two) times daily. 60 tablet 3  . CALCIUM PO Take 1 tablet by mouth daily. '600mg'$     . escitalopram (LEXAPRO) 10 MG tablet Take 1 tablet (10 mg total) by mouth daily. 90 tablet 0  . furosemide (LASIX) 40 MG tablet TAKE 1 TABLET DAILY AS NEEDED FOR EDEMA 90 tablet 3  . gabapentin (NEURONTIN) 400 MG capsule TAKE 1 CAPSULE FOUR TIMES DAILY 360 capsule 3  . Multiple Vitamins-Minerals (MULTIVITAMINS THER. W/MINERALS) TABS Take 1 tablet by mouth daily.     . nitroGLYCERIN (NITROSTAT) 0.4 MG SL tablet Place 0.4 mg under the tongue every 5 (five) minutes as needed for chest pain.     Marland Kitchen ondansetron (ZOFRAN) 4 MG tablet Take 1 tablet (4 mg total) by mouth every 8 (eight) hours as needed for nausea or vomiting. 20 tablet 1  . pantoprazole (PROTONIX) 40 MG tablet Take 1 tablet (40 mg total) by mouth daily before breakfast. 30 tablet 11  . potassium chloride SA (K-DUR,KLOR-CON) 20 MEQ tablet TAKE 1 TABLET EVERY  DAY 90 tablet 1  . promethazine (PHENERGAN) 12.5 MG tablet Take 1 tablet (12.5 mg total) by mouth every 6 (six) hours as needed for nausea or vomiting. 20 tablet 0  . saccharomyces boulardii (FLORASTOR) 250 MG  capsule Take 1 capsule (250 mg total) by mouth 2 (two) times daily. 30 capsule 0  . traMADol (ULTRAM) 50 MG tablet Take 1 tablet (50 mg total) by mouth 4 (four) times daily. 120 tablet 3  . VITAMIN D, CHOLECALCIFEROL, PO Take 1,200 Units by mouth daily.    . benzonatate (TESSALON) 200 MG capsule Take 1 capsule (200 mg total) by mouth 3 (three) times daily as needed. (Patient not taking: Reported on 12/12/2017) 60 capsule 0  . ciprofloxacin (CIPRO) 500 MG tablet Take 1 tablet (500 mg total) by mouth 2 (two) times daily. (Patient not taking: Reported on 12/12/2017) 10 tablet 0   No facility-administered medications prior to visit.      EXAM:  BP (!) 152/90 (BP Location: Right Arm, Patient Position: Sitting, Cuff Size: Normal)   Pulse 60   Temp 98.4 F (36.9 C) (Oral)   Wt 250 lb (113.4 kg)   SpO2 98%   BMI 40.35 kg/m   Body mass index is 40.35 kg/m.  GENERAL: vitals reviewed and listed above, alert, oriented, appears well hydrated and in no acute distress HEENT: atraumatic, conjunctiva  clear, no obvious abnormalities on inspection of external nose and ears NECK: no obvious masses on inspection palpation  LUNGS: clear to auscultation bilaterally, no wheezes, rales or rhonchi, good air movement CV: HRRR, no clubbing cyanosis or  nl cap refill  MS: moves all extremities   Chronic changes legs but right has  tightness swelling and redness warmth on medial  Right proximal areas    No streakiness and knee no effusion   Gait a bit antalgic   PSYCH: pleasant and cooperative, no obvious depression or anxiety Lab Results  Component Value Date   WBC 8.0 09/15/2017   HGB 11.6 (L) 09/15/2017   HCT 36.8 09/15/2017   PLT 191.0 09/15/2017   GLUCOSE 82 09/15/2017   CHOL 149 12/11/2015   TRIG 62.0 12/11/2015   HDL 76.80 12/11/2015   LDLCALC 60 12/11/2015   ALT 17 09/15/2017   AST 17 09/15/2017   NA 142 09/15/2017   K 3.6 09/15/2017   CL 115 (H) 09/15/2017   CREATININE 0.70 09/15/2017   BUN  12 09/15/2017   CO2 20 09/15/2017   TSH 0.83 09/15/2017   INR 1.16 05/22/2013   HGBA1C 5.2 12/11/2015   BP Readings from Last 3 Encounters:  12/12/17 (!) 152/90  09/15/17 132/84  09/05/17 (!) 150/90   . ASSESSMENT AND PLAN:  Discussed the following assessment and plan:  Pain and swelling of right lower leg - Plan: VAS [place] LOWER EXTREMITY VENOUS (DVT), CANCELED: UE VENOUS DUPLEX  History of deep vein thrombosis (DVT) of lower extremity - Plan: VAS [place] LOWER EXTREMITY VENOUS (DVT), CANCELED: UE VENOUS DUPLEX  Essential hypertension - up today reported as controlled   S/P gastric bypass Reviewed findings and  Poss causes  Cover with antibiotic for possible cellulitis  And  Inc asa  elevatiuon and warm compresses  .ultrasound to be done in am  -Patient advised to return or notify health care team  if  new concerns arise.  But if worsening over night to see care in the ED   [place] not able to be done until tomorrow am .   Lives alone  Patient Instructions  Getting  Ultrasound of the leg to check for clotting    consideration of infection also .   If positive  We will need to put you on a blood thinner   And FU with your PCP.  Elevate,  Warm  compresses  ASA .  325 mg  And add antibiotic  If getting worse seek care in ED.      Standley Brooking. Ismerai Bin M.D.

## 2017-12-13 ENCOUNTER — Ambulatory Visit (HOSPITAL_COMMUNITY)
Admission: RE | Admit: 2017-12-13 | Discharge: 2017-12-13 | Disposition: A | Discharge status: Home / Self Care | Payer: Medicare HMO | Source: Ambulatory Visit | Attending: Internal Medicine | Admitting: Internal Medicine

## 2017-12-13 ENCOUNTER — Telehealth: Payer: Self-pay | Admitting: Internal Medicine

## 2017-12-13 DIAGNOSIS — M7989 Other specified soft tissue disorders: Secondary | ICD-10-CM | POA: Insufficient documentation

## 2017-12-13 DIAGNOSIS — Z86718 Personal history of other venous thrombosis and embolism: Secondary | ICD-10-CM | POA: Diagnosis not present

## 2017-12-13 DIAGNOSIS — M79661 Pain in right lower leg: Secondary | ICD-10-CM | POA: Insufficient documentation

## 2017-12-13 NOTE — Telephone Encounter (Signed)
Please advise Dr Regis Bill, thanks.  Waiting on report

## 2017-12-13 NOTE — Progress Notes (Signed)
Right lower extremity venous duplex completed. Preliminary results - Technically difficult due to edema and tightness of the calf. There is no obvious evidence of DVT however the peroneal vein could not be fully evaluated due to previous mentioned difficulties. There is no evidence of a Baker's Cyst. [facility], RVS 12/13/2017, 10:36 AM

## 2017-12-13 NOTE — Telephone Encounter (Signed)
[place] with [facility] called a report of RLE Venous Study negative. She asks what does she need to tell the patient who is sitting waiting, I advised I will call the office and connect her over. I called the office and spoke to [place] who says to let the patient go and they will call the patient later on when the provider reviews the results. I advised to [place] of the above.

## 2017-12-13 NOTE — Telephone Encounter (Signed)
Should have been sent to ordering Provider  Please advise Dr Regis Bill, thanks.

## 2017-12-13 NOTE — Telephone Encounter (Signed)
Did you mean to send to Dr. Regis Bill?

## 2017-12-14 NOTE — Telephone Encounter (Signed)
See result note.  

## 2017-12-16 ENCOUNTER — Ambulatory Visit (INDEPENDENT_AMBULATORY_CARE_PROVIDER_SITE_OTHER): Payer: Medicare HMO | Admitting: Internal Medicine

## 2017-12-16 ENCOUNTER — Encounter: Payer: Self-pay | Admitting: Internal Medicine

## 2017-12-16 VITALS — BP 158/80 | HR 64 | Temp 98.7°F | Ht 66.0 in | Wt 255.0 lb

## 2017-12-16 DIAGNOSIS — M79604 Pain in right leg: Secondary | ICD-10-CM | POA: Diagnosis not present

## 2017-12-16 DIAGNOSIS — L0390 Cellulitis, unspecified: Secondary | ICD-10-CM | POA: Insufficient documentation

## 2017-12-16 DIAGNOSIS — L03115 Cellulitis of right lower limb: Secondary | ICD-10-CM | POA: Diagnosis not present

## 2017-12-16 DIAGNOSIS — Z23 Encounter for immunization: Secondary | ICD-10-CM

## 2017-12-16 MED ORDER — CEPHALEXIN 500 MG PO CAPS: 500.0000 mg | ORAL_CAPSULE | Freq: Two times a day (BID) | ORAL | 0 refills | Status: DC

## 2017-12-16 NOTE — Assessment & Plan Note (Signed)
Continue tramadol for pain and is not improving at this time despite 1 week of therapy with doxycyline. Change to keflex BID for 1 week and needs recheck next week if not improving. Advised to continue elevating.

## 2017-12-16 NOTE — Progress Notes (Signed)
   Subjective:    Patient ID: [name], female    DOB: 12-Nov-1955, 62 y.o.   MRN: [id number]  HPI The patient is a 62 YO female coming in for right leg redness and swelling and pain. Started about 1 week ago and was seen about 6 days ago. She was seen and given doxycycline and got [place]. She has history of DVT although recent US was negative for current DVT. She did take the doxycycline and this did help mild amount of swelling to decrease. She is still having pain and it is worse if anything. She is using tramadol for the pain. She denies fevers or chills. The heat coming off her leg is down slightly. She has been elevating her leg often.   Review of Systems  Constitutional: Negative.   HENT: Negative.   Eyes: Negative.   Respiratory: Negative for cough, chest tightness and shortness of breath.   Cardiovascular: Positive for leg swelling. Negative for chest pain and palpitations.  Gastrointestinal: Negative for abdominal distention, abdominal pain, constipation, diarrhea, nausea and vomiting.  Musculoskeletal: Positive for joint swelling and myalgias.  Skin: Negative.   Neurological: Negative.   Psychiatric/Behavioral: Negative.       Objective:   Physical Exam  Constitutional: She is oriented to person, place, and time. She appears well-developed and well-nourished.  HENT:  Head: Normocephalic and atraumatic.  Eyes: EOM are normal.  Neck: Normal range of motion.  Cardiovascular: Normal rate and regular rhythm.  Pulmonary/Chest: Effort normal and breath sounds normal. No respiratory distress. She has no wheezes. She has no rales.  Abdominal: Soft. Bowel sounds are normal. She exhibits no distension. There is no tenderness. There is no rebound.  Musculoskeletal: She exhibits edema and tenderness.  Right leg edema from ankle to knee 1-2+ Pain in the right ankle and extending to the mid shin directly inferior to the knee, knee without effusion, redness to the skin from knee to  ankle, some heat on palpation  Neurological: She is alert and oriented to person, place, and time. Coordination normal.  Skin: Skin is warm and dry.  Psychiatric: She has a normal mood and affect.   Vitals:   12/16/17 1525  BP: (!) 158/80  Pulse: 64  Temp: 98.7 F (37.1 C)  TempSrc: Oral  SpO2: 99%  Weight: 255 lb (115.7 kg)  Height: 5\' 6"  (1.676 m)      Assessment & Plan:  Flu shot given at visit

## 2017-12-16 NOTE — Patient Instructions (Signed)
We have sent in keflex to take 1 pill twice a day for 1 week.   

## 2017-12-16 NOTE — Assessment & Plan Note (Signed)
Has failed doxycycline therapy and will change to keflex. Allergy to sulfa making bactrim not an option. She is using tramadol for pain. Advised to elevate legs. US done which has ruled out DVT.

## 2017-12-20 ENCOUNTER — Encounter: Payer: Self-pay | Admitting: Internal Medicine

## 2018-01-05 ENCOUNTER — Encounter: Payer: Self-pay | Admitting: Internal Medicine
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
## 2018-01-10 ENCOUNTER — Ambulatory Visit: Payer: Medicare HMO | Admitting: Neurology

## 2018-01-10 DIAGNOSIS — Z029 Encounter for administrative examinations, unspecified: Secondary | ICD-10-CM

## 2018-01-10 NOTE — Progress Notes (Deleted)
[facility] Clinic Note - Initial Visit   Date: 01/10/18  [name] MRN: [id number] DOB: 14-Dec-1955   Dear Jessica Mourning, FNP:  Thank you for your kind referral of [name] for consultation of ***. Although *** history is well known to you, please allow [place] to reiterate it for the purpose of our medical record. The patient was accompanied to the clinic by *** who also provides collateral information.     History of Present Illness: [name] is a 62 y.o. ***-handed Caucasian female with hypertension, CHF, diabetes mellitus, depression, GERD, and fibromyalgia presenting for evaluation of generalized weakness.    ***   Out-side paper records, electronic medical record, and images have been reviewed where available and summarized as: *** Labs 09/15/2017: Ferritin 10.7, CK 108, TSH 0.8, vitamin B12 > 1500, AChR antibody neg, vitamin D <7*  MRI brain wo contrast 10/26/2017: Chronic small-vessel ischemic changes affecting the brain, similar to the study of 2017 allowing for technical differences. No acute or subacute finding.  Abnormal appearance of the upper cervical spine, most likely indicating spondylosis which could be progressive since 2017. If there is concern regarding the possibility of cervical disease being responsible for the upper extremity symptoms, cervical MRI would be suggested.  MRI cervical spine 03/23/2016: 1. C3-4 disc degeneration has progressed since 2015 comparison but moderate right foraminal stenosis and mild spinal stenosis is stable. 2. No other foraminal stenosis to explain radiculopathy. 3. C5-6 stable disc degeneration with early spinal stenosis. 4. Motion degraded exam.  MRI lumbar spine 03/25/2012: Multilevel disc disease and severe facet disease contributing to spinal, lateral recess and foraminal stenosis as discussed above at the individual levels.  L4-5 is the most significant level.  No  significant change since prior study.   NCS/EMG of the legs 03/05/2018: Impression: 1. The electrophysiologic findings are most consistent with a distal and symmetric sensorimotor polyneuropathy, axon loss in type, affecting the lower extremities. Overall, these findings are moderate in degree electrically. 2. Chronic S1 radiculopathy affecting bilateral lower extremities; moderate in degree electrically. 3. Chronic L2-L4 radiculopathy affecting bilateral lower extremities; moderate in degree electrically.  Past Medical History:  Diagnosis Date  . Anemia    takes Ferrous Sulfate daily  . Anxiety    takes Citalopram daily  . Arthritis   . Asthma    2004-prior to gastric bypass and no problems since  . CHF (congestive heart failure) (HCC)    takes Lasix daily as needed  . Chronic back pain    spondylolisthesis/stenosis/radiculopathy  . Complication of anesthesia yrs ago   slow to wake up  . Depression   . Diabetes ([place])   . DVT (deep venous thrombosis) ([place]) 01-17-13   past hx. -tx.5-6 yrs ago bilateral legs, occ. sporadic swelling, has IVC filter implanted  . Dysrhythmia    "heart tends to flutter"  . Fibromyalgia   . Fracture    right foot and is in a cam boot  . Gallstones   . GERD (gastroesophageal reflux disease)    hx of-no meds now  . Heart murmur   . History of bronchitis 2012 or 2013  . History of colon polyps   . Hypertension    takes Metoprolol daily  . Insomnia    takes Melatonin daily  . Joint pain   . Joint swelling   . Pelvic floor dysfunction   . Peripheral neuropathy   . Pneumonia 90's   hx of  . S/P gastric bypass   .  Sleep apnea    no cpap used in many yrs after weight lost-no machine now  . Urinary frequency   . Urinary urgency   . Vitamin D deficiency     Past Surgical History:  Procedure Laterality Date  . BALLOON DILATION N/A 01/31/2013   Procedure: BALLOON DILATION;  Surgeon: Arta Silence, MD;  Location: [facility];  Service:  Endoscopy;  Laterality: N/A;  . CHOLECYSTECTOMY  2007  . COLONOSCOPY    . DILATION AND CURETTAGE OF UTERUS  yrs ago  . ESOPHAGOGASTRODUODENOSCOPY (EGD) WITH PROPOFOL  03/01/2012   Procedure: ESOPHAGOGASTRODUODENOSCOPY (EGD) WITH PROPOFOL;  Surgeon: Arta Silence, MD;  Location: [facility];  Service: Endoscopy;  Laterality: N/A;  . ESOPHAGOGASTRODUODENOSCOPY (EGD) WITH PROPOFOL N/A 01/31/2013   Procedure: ESOPHAGOGASTRODUODENOSCOPY (EGD) WITH PROPOFOL;  Surgeon: Arta Silence, MD;  Location: [facility];  Service: Endoscopy;  Laterality: N/A;  . ESOPHAGOGASTRODUODENOSCOPY (EGD) WITH PROPOFOL N/A 09/02/2015   Procedure: ESOPHAGOGASTRODUODENOSCOPY (EGD) WITH PROPOFOL;  Surgeon: Gatha Mayer, MD;  Location: [facility];  Service: Endoscopy;  Laterality: N/A;  . GASTRIC BY-PASS  2004  . HERNIA REPAIR  2005  . INSERTION OF VENA CAVA FILTER  01-17-13   inserted 2004- "abdomen"  . LIGAMENT REPAIR Right 1987   Rt. knee scope  . MAXIMUM ACCESS (MAS)POSTERIOR LUMBAR INTERBODY FUSION (PLIF) 2 LEVEL N/A 06/07/2014   Procedure: L4-5 L5-S1 FOR MAXIMUM ACCESS (MAS) POSTERIOR LUMBAR INTERBODY FUSION ;  Surgeon: Erline Levine, MD;  Location: [facility];  Service: Neurosurgery;  Laterality: N/A;  L4-5 L5-S1 FOR MAXIMUM ACCESS (MAS) POSTERIOR LUMBAR INTERBODY FUSION   . TONSILLECTOMY     as child     Medications:  Outpatient Encounter Medications as of 01/10/2018  Medication Sig  . ACCU-CHEK AVIVA PLUS test strip USE  TO CHECK BLOOD SUGAR TWICE DAILY  . ACCU-CHEK SOFTCLIX LANCETS lancets CHECK BLOOD SUGARS TWICE DAILY  . albuterol (PROVENTIL HFA;VENTOLIN HFA) 108 (90 Base) MCG/ACT inhaler Inhale 1-2 puffs into the lungs every 6 (six) hours as needed for wheezing or shortness of breath.  . Alcohol Swabs (B-D SINGLE USE SWABS REGULAR) PADS USE TO WIPE AREA TO CHECK BLOOD SUGAR DAILY   . aspirin EC 81 MG tablet Take 81 mg by mouth daily.  Marland Kitchen BLACK COHOSH PO Take 1 tablet by mouth daily.  . Blood Glucose  Calibration (ACCU-CHEK AVIVA) SOLN Use as directed  . Blood Glucose Monitoring Suppl (ACCU-CHEK AVIVA PLUS) w/Device KIT Use as directed to check blood sugars daily Dx E11.9  . buPROPion (WELLBUTRIN SR) 150 MG 12 hr tablet Take 1 tablet (150 mg total) by mouth 2 (two) times daily.  Marland Kitchen CALCIUM PO Take 1 tablet by mouth daily. 65m  . cephALEXin (KEFLEX) 500 MG capsule Take 1 capsule (500 mg total) by mouth 2 (two) times daily.  .Marland Kitchenescitalopram (LEXAPRO) 10 MG tablet Take 1 tablet (10 mg total) by mouth daily.  . furosemide (LASIX) 40 MG tablet TAKE 1 TABLET DAILY AS NEEDED FOR EDEMA  . gabapentin (NEURONTIN) 400 MG capsule TAKE 1 CAPSULE FOUR TIMES DAILY  . Multiple Vitamins-Minerals (MULTIVITAMINS THER. W/MINERALS) TABS Take 1 tablet by mouth daily.   . nitroGLYCERIN (NITROSTAT) 0.4 MG SL tablet Place 0.4 mg under the tongue every 5 (five) minutes as needed for chest pain.   .Marland Kitchenondansetron (ZOFRAN) 4 MG tablet Take 1 tablet (4 mg total) by mouth every 8 (eight) hours as needed for nausea or vomiting.  . pantoprazole (PROTONIX) 40 MG tablet Take 1 tablet (40 mg  total) by mouth daily before breakfast.  . potassium chloride SA (K-DUR,KLOR-CON) 20 MEQ tablet TAKE 1 TABLET EVERY DAY  . promethazine (PHENERGAN) 12.5 MG tablet Take 1 tablet (12.5 mg total) by mouth every 6 (six) hours as needed for nausea or vomiting.  . saccharomyces boulardii (FLORASTOR) 250 MG capsule Take 1 capsule (250 mg total) by mouth 2 (two) times daily.  . traMADol (ULTRAM) 50 MG tablet Take 1 tablet (50 mg total) by mouth 4 (four) times daily.  Marland Kitchen VITAMIN D, CHOLECALCIFEROL, PO Take 1,200 Units by mouth daily.   No facility-administered encounter medications on file as of 01/10/2018.      Allergies:  Allergies  Allergen Reactions  . Carafate [Sucralfate] Hives    Other reaction(s): Angioedema (ALLERGY/intolerance) hives scratching  . Sulfonamide Derivatives Rash    REACTION: Syncope    Family History: Family  History  Problem Relation Age of Onset  . Diabetes Mother   . Heart disease Father   . Hypertension Father   . Prostate cancer Father   . Kidney disease Brother   . Colon cancer Neg Hx   . Colon polyps Neg Hx   . Stomach cancer Neg Hx   . Esophageal cancer Neg Hx   . Pancreatic cancer Neg Hx   . Liver disease Neg Hx     Social History: Social History   Tobacco Use  . Smoking status: Former Smoker    Packs/day: 0.50    Years: 10.00    Pack years: 5.00    Types: Cigarettes    Last attempt to quit: 04/19/2009    Years since quitting: 8.7  . Smokeless tobacco: Never Used  Substance Use Topics  . Alcohol use: Yes    Alcohol/week: 0.0 standard drinks    Comment: occ. social- wine-1 drink monthly  . Drug use: No   Social History   Social History Narrative  . Not on file    Review of Systems:  CONSTITUTIONAL: No fevers, chills, night sweats, or weight loss.  *** EYES: No visual changes or eye pain ENT: No hearing changes.  No history of nose bleeds.   RESPIRATORY: No cough, wheezing and shortness of breath.   CARDIOVASCULAR: Negative for chest pain, and palpitations.   GI: Negative for abdominal discomfort, blood in stools or black stools.  No recent change in bowel habits.   GU:  No history of incontinence.   MUSCLOSKELETAL: No history of joint pain or swelling.  No myalgias.   SKIN: Negative for lesions, rash, and itching.   HEMATOLOGY/ONCOLOGY: Negative for prolonged bleeding, bruising easily, and swollen nodes.  No history of cancer.   ENDOCRINE: Negative for cold or heat intolerance, polydipsia or goiter.   PSYCH:  ***depression or anxiety symptoms.   NEURO: As Above.   Vital Signs:  There were no vitals taken for this visit. Pain Scale: *** on a scale of 0-10   General Medical Exam:  *** General:  Well appearing, comfortable.   Eyes/ENT: see cranial nerve examination.   Neck: No masses appreciated.  Full range of motion without tenderness.  No carotid  bruits. Respiratory:  Clear to auscultation, good air entry bilaterally.   Cardiac:  Regular rate and rhythm, no murmur.   Extremities:  No deformities, edema, or skin discoloration.  Skin:  No rashes or lesions.  Neurological Exam: MENTAL STATUS including orientation to time, place, person, recent and remote memory, attention span and concentration, language, and fund of knowledge is ***normal.  Speech is not dysarthric.  CRANIAL NERVES: II:  No visual field defects.  Unremarkable fundi.   III-IV-VI: Pupils equal round and reactive to light.  Normal conjugate, extra-ocular eye movements in all directions of gaze.  No nystagmus.  No ptosis***.   V:  Normal facial sensation.  Jaw jerk is ***.   VII:  Normal facial symmetry and movements.  No pathologic facial reflexes.  VIII:  Normal hearing and vestibular function.   IX-X:  Normal palatal movement.   XI:  Normal shoulder shrug and head rotation.   XII:  Normal tongue strength and range of motion, no deviation or fasciculation.  MOTOR:  No atrophy, fasciculations or abnormal movements.  No pronator drift.  Tone is normal.    Right Upper Extremity:    Left Upper Extremity:    Deltoid  5/5   Deltoid  5/5   Biceps  5/5   Biceps  5/5   Triceps  5/5   Triceps  5/5   Wrist extensors  5/5   Wrist extensors  5/5   Wrist flexors  5/5   Wrist flexors  5/5   Finger extensors  5/5   Finger extensors  5/5   Finger flexors  5/5   Finger flexors  5/5   Dorsal interossei  5/5   Dorsal interossei  5/5   Abductor pollicis  5/5   Abductor pollicis  5/5   Tone (Ashworth scale)  0  Tone (Ashworth scale)  0   Right Lower Extremity:    Left Lower Extremity:    Hip flexors  5/5   Hip flexors  5/5   Hip extensors  5/5   Hip extensors  5/5   Knee flexors  5/5   Knee flexors  5/5   Knee extensors  5/5   Knee extensors  5/5   Dorsiflexors  5/5   Dorsiflexors  5/5   Plantarflexors  5/5   Plantarflexors  5/5   Toe extensors  5/5   Toe extensors  5/5   Toe  flexors  5/5   Toe flexors  5/5   Tone (Ashworth scale)  0  Tone (Ashworth scale)  0   MSRs:  Right                                                                 Left brachioradialis 2+  brachioradialis 2+  biceps 2+  biceps 2+  triceps 2+  triceps 2+  patellar 2+  patellar 2+  ankle jerk 2+  ankle jerk 2+  Hoffman no  Hoffman no  plantar response down  plantar response down   SENSORY:  Normal and symmetric perception of light touch, pinprick, vibration, and proprioception.  Romberg's sign absent.   COORDINATION/GAIT: Normal finger-to- nose-finger and heel-to-shin.  Intact rapid alternating movements bilaterally.  Able to rise from a chair without using arms.  Gait narrow based and stable. Tandem and stressed gait intact.    IMPRESSION: ***  PLAN/RECOMMENDATIONS:  *** Return to clinic in *** months.   The duration of this appointment visit was *** minutes of face-to-face time with the patient.  Greater than 50% of this time was spent in counseling, explanation of diagnosis, planning of further management, and coordination of care.   Thank you for allowing me to participate in  patient's care.  If I can answer any additional questions, I would be pleased to do so.    Sincerely,    Kimmora Risenhoover K. Posey Pronto, DO

## 2018-01-17 ENCOUNTER — Encounter: Payer: Self-pay | Admitting: Internal Medicine

## 2018-01-17 ENCOUNTER — Ambulatory Visit (INDEPENDENT_AMBULATORY_CARE_PROVIDER_SITE_OTHER): Payer: Medicare HMO | Admitting: Internal Medicine

## 2018-01-17 VITALS — BP 180/90 | HR 74 | Temp 98.8°F | Ht 66.0 in

## 2018-01-17 DIAGNOSIS — L03115 Cellulitis of right lower limb: Secondary | ICD-10-CM | POA: Diagnosis not present

## 2018-01-17 DIAGNOSIS — M7989 Other specified soft tissue disorders: Secondary | ICD-10-CM

## 2018-01-17 DIAGNOSIS — M79675 Pain in left toe(s): Secondary | ICD-10-CM

## 2018-01-17 DIAGNOSIS — Z86718 Personal history of other venous thrombosis and embolism: Secondary | ICD-10-CM | POA: Diagnosis not present

## 2018-01-17 MED ORDER — SILVER SULFADIAZINE 1 % EX CREA: 1.0000 "application " | TOPICAL_CREAM | Freq: Every day | CUTANEOUS | 0 refills | Status: DC

## 2018-01-17 NOTE — Patient Instructions (Signed)
We have given you the silvadene cream to the pharmacy.    We have given you the compression stockings to get for the right leg swelling.

## 2018-01-19 ENCOUNTER — Other Ambulatory Visit: Payer: Self-pay | Admitting: Internal Medicine

## 2018-01-20 ENCOUNTER — Encounter: Payer: Self-pay | Admitting: Internal Medicine

## 2018-01-20 ENCOUNTER — Ambulatory Visit: Payer: Medicare HMO | Admitting: Neurology

## 2018-01-20 DIAGNOSIS — M79675 Pain in left toe(s): Secondary | ICD-10-CM | POA: Insufficient documentation

## 2018-01-20 NOTE — Assessment & Plan Note (Signed)
Appears to be resolved although the skin is still sensitive to touch, no heat or color change (except from chronic venous insufficiency). Would not do another course of antibiotics, advised compression stockings and rx given today.

## 2018-01-20 NOTE — Progress Notes (Signed)
   Subjective:    Patient ID: [name], female    DOB: 1955-09-21, 62 y.o.   MRN: [id number]  HPI The patient is a 62 YO female coming in for several concerns including left toe pain (wooden bar stool fell on her toe, happened about 4 days prior to visit, has tried ice and tramadol which take the edge off, the toenail fell off and it was bleeding some from this area, swollen and red, hurts when she walks on it, severe 8/10 pain and worse with walking, when sitting goes down to 3-4/10) and leg swelling (right leg swelling worse than left, chronic and persistent, she is not using compression stockings often, denies bad swelling currently, was worse in the really hot weather), and right leg pain (area with prior cellulitis is now slightly less red, some color change still, overall improving after the courses of antibiotics, not normal yet, some sensitivity).   Review of Systems  Constitutional: Negative.   HENT: Negative.   Eyes: Negative.   Respiratory: Negative for cough, chest tightness and shortness of breath.   Cardiovascular: Negative for chest pain, palpitations and leg swelling.  Gastrointestinal: Negative for abdominal distention, abdominal pain, constipation, diarrhea, nausea and vomiting.  Musculoskeletal: Positive for arthralgias, gait problem, joint swelling and myalgias.  Skin: Positive for color change and wound.  Psychiatric/Behavioral: Negative.       Objective:   Physical Exam  Constitutional: She is oriented to person, place, and time. She appears well-developed and well-nourished. She appears distressed.  Appears in pain  HENT:  Head: Normocephalic and atraumatic.  Eyes: EOM are normal.  Neck: Normal range of motion.  Cardiovascular: Normal rate and regular rhythm.  Pulmonary/Chest: Effort normal and breath sounds normal. No respiratory distress. She has no wheezes. She has no rales.  Abdominal: Soft. Bowel sounds are normal. She exhibits no distension. There  is no tenderness. There is no rebound.  Musculoskeletal: She exhibits no edema.  Left toe appears swollen, very tender to touch, nail is traumatically removed, appears broken, right leg with swelling and some darkening of the skin at site of prior cellullitis, no active cellulitis appreciated. 1+ edema bilaterally to the knees.   Neurological: She is alert and oriented to person, place, and time. Coordination normal.  Skin: Skin is warm and dry.   Vitals:   01/17/18 1524  BP: (!) 180/90  Pulse: 74  Temp: 98.8 F (37.1 C)  TempSrc: Oral  SpO2: 98%  Height: 5\' 6"  (1.676 m)      Assessment & Plan:

## 2018-01-20 NOTE — Assessment & Plan Note (Signed)
Having more swelling in her legs recently and rx for comression stockings given today. This was when her leg swelling started.

## 2018-01-20 NOTE — Assessment & Plan Note (Addendum)
Appears to be broken on exam, is in walking shoe already which is appropriate. Asked her to rest for the next 1-2 weeks and resume light activity as able. No imaging as this would not change our management. Tramadol can be used for pain. Rx for silvadene cream to use on exposed nail and nail bed until callus forms.

## 2018-02-07 ENCOUNTER — Ambulatory Visit: Payer: Medicare HMO | Admitting: Podiatry

## 2018-02-07 NOTE — Telephone Encounter (Signed)
error 

## 2018-02-12 ENCOUNTER — Other Ambulatory Visit: Payer: Self-pay | Admitting: Internal Medicine

## 2018-02-13 NOTE — Telephone Encounter (Signed)
Control database checked last refill: 01/14/2018 LOV: 01/17/2018 NOV: none

## 2018-02-14 NOTE — Progress Notes (Signed)
[name] [address] [place], [place] [address] Phone: [phone] Subjective:    I [name] am serving as a Education administrator for Dr. Hulan Saas.   I'm seeing this patient by the request  of: Hoyt Koch, MD    CC: Toe pain  [id number]  [name] is a 62 y.o. female coming in with complaint of chronic foot pain. Stool fell on her foot and she is having trouble walking with walking.  Pain is mostly over the left first toe.  States that it seems to be swollen.  Hurts with any type of stent.  Sometimes a throbbing aching sensation as well.  Did initially injure herself something fell on her toe.  Been going for 1 month now.     Past Medical History:  Diagnosis Date  . Anemia    takes Ferrous Sulfate daily  . Anxiety    takes Citalopram daily  . Arthritis   . Asthma    2004-prior to gastric bypass and no problems since  . CHF (congestive heart failure) (HCC)    takes Lasix daily as needed  . Chronic back pain    spondylolisthesis/stenosis/radiculopathy  . Complication of anesthesia yrs ago   slow to wake up  . Depression   . Diabetes ([place])   . DVT (deep venous thrombosis) ([facility]) 01-17-13   past hx. -tx.5-6 yrs ago bilateral legs, occ. sporadic swelling, has IVC filter implanted  . Dysrhythmia    "heart tends to flutter"  . Fibromyalgia   . Fracture    right foot and is in a cam boot  . Gallstones   . GERD (gastroesophageal reflux disease)    hx of-no meds now  . Heart murmur   . History of bronchitis 2012 or 2013  . History of colon polyps   . Hypertension    takes Metoprolol daily  . Insomnia    takes Melatonin daily  . Joint pain   . Joint swelling   . Pelvic floor dysfunction   . Peripheral neuropathy   . Pneumonia 90's   hx of  . S/P gastric bypass   . Sleep apnea    no cpap used in many yrs after weight lost-no machine now  . Urinary frequency   . Urinary urgency   . Vitamin D deficiency    Past  Surgical History:  Procedure Laterality Date  . BALLOON DILATION N/A 01/31/2013   Procedure: BALLOON DILATION;  Surgeon: Arta Silence, MD;  Location: [facility];  Service: Endoscopy;  Laterality: N/A;  . CHOLECYSTECTOMY  2007  . COLONOSCOPY    . DILATION AND CURETTAGE OF UTERUS  yrs ago  . ESOPHAGOGASTRODUODENOSCOPY (EGD) WITH PROPOFOL  03/01/2012   Procedure: ESOPHAGOGASTRODUODENOSCOPY (EGD) WITH PROPOFOL;  Surgeon: Arta Silence, MD;  Location: [facility];  Service: Endoscopy;  Laterality: N/A;  . ESOPHAGOGASTRODUODENOSCOPY (EGD) WITH PROPOFOL N/A 01/31/2013   Procedure: ESOPHAGOGASTRODUODENOSCOPY (EGD) WITH PROPOFOL;  Surgeon: Arta Silence, MD;  Location: [facility];  Service: Endoscopy;  Laterality: N/A;  . ESOPHAGOGASTRODUODENOSCOPY (EGD) WITH PROPOFOL N/A 09/02/2015   Procedure: ESOPHAGOGASTRODUODENOSCOPY (EGD) WITH PROPOFOL;  Surgeon: Gatha Mayer, MD;  Location: [facility];  Service: Endoscopy;  Laterality: N/A;  . GASTRIC BY-PASS  2004  . HERNIA REPAIR  2005  . INSERTION OF VENA CAVA FILTER  01-17-13   inserted 2004- "abdomen"  . LIGAMENT REPAIR Right 1987   Rt. knee scope  . MAXIMUM ACCESS (MAS)POSTERIOR LUMBAR INTERBODY FUSION (PLIF) 2 LEVEL N/A 06/07/2014  Procedure: L4-5 L5-S1 FOR MAXIMUM ACCESS (MAS) POSTERIOR LUMBAR INTERBODY FUSION ;  Surgeon: Erline Levine, MD;  Location: [facility];  Service: Neurosurgery;  Laterality: N/A;  L4-5 L5-S1 FOR MAXIMUM ACCESS (MAS) POSTERIOR LUMBAR INTERBODY FUSION   . TONSILLECTOMY     as child   Social History   Socioeconomic History  . Marital status: Widowed    Spouse name: Not on file  . Number of children: 0  . Years of education: college  . Highest education level: Not on file  Occupational History  . Occupation: retired  Scientific laboratory technician  . Financial resource strain: Not on file  . Food insecurity:    Worry: Not on file    Inability: Not on file  . Transportation needs:    Medical: Not on file    Non-medical:  Not on file  Tobacco Use  . Smoking status: Former Smoker    Packs/day: 0.50    Years: 10.00    Pack years: 5.00    Types: Cigarettes    Last attempt to quit: 04/19/2009    Years since quitting: 8.8  . Smokeless tobacco: Never Used  Substance and Sexual Activity  . Alcohol use: Yes    Alcohol/week: 0.0 standard drinks    Comment: occ. social- wine-1 drink monthly  . Drug use: No  . Sexual activity: Not Currently  Lifestyle  . Physical activity:    Days per week: Not on file    Minutes per session: Not on file  . Stress: Not on file  Relationships  . Social connections:    Talks on phone: Not on file    Gets together: Not on file    Attends religious service: Not on file    Active member of club or organization: Not on file    Attends meetings of clubs or organizations: Not on file    Relationship status: Not on file  Other Topics Concern  . Not on file  Social History Narrative  . Not on file   Allergies  Allergen Reactions  . Carafate [Sucralfate] Hives    Other reaction(s): Angioedema (ALLERGY/intolerance) hives scratching  . Sulfonamide Derivatives Rash    REACTION: Syncope   Family History  Problem Relation Age of Onset  . Diabetes Mother   . Heart disease Father   . Hypertension Father   . Prostate cancer Father   . Kidney disease Brother   . Colon cancer Neg Hx   . Colon polyps Neg Hx   . Stomach cancer Neg Hx   . Esophageal cancer Neg Hx   . Pancreatic cancer Neg Hx   . Liver disease Neg Hx      Current Outpatient Medications (Cardiovascular):  .  furosemide (LASIX) 40 MG tablet, TAKE 1 TABLET DAILY AS NEEDED FOR EDEMA  Current Outpatient Medications (Respiratory):  .  albuterol (PROVENTIL HFA;VENTOLIN HFA) 108 (90 Base) MCG/ACT inhaler, Inhale 1-2 puffs into the lungs every 6 (six) hours as needed for wheezing or shortness of breath. .  promethazine (PHENERGAN) 12.5 MG tablet, Take 1 tablet (12.5 mg total) by mouth every 6 (six) hours as needed  for nausea or vomiting.  Current Outpatient Medications (Analgesics):  .  aspirin EC 81 MG tablet, Take 81 mg by mouth daily. .  traMADol (ULTRAM) 50 MG tablet, TAKE 1 TABLET(50 MG) BY MOUTH FOUR TIMES DAILY .  colchicine 0.6 MG tablet, Take 1 tablet (0.6 mg total) by mouth 2 (two) times daily.   Current Outpatient Medications (Other):  .  ACCU-CHEK AVIVA PLUS test strip, USE  TO CHECK BLOOD SUGAR TWICE DAILY .  ACCU-CHEK SOFTCLIX LANCETS lancets, CHECK BLOOD SUGARS TWICE DAILY .  Alcohol Swabs (B-D SINGLE USE SWABS REGULAR) PADS, USE TO WIPE AREA TO CHECK BLOOD SUGAR DAILY  .  BLACK COHOSH PO, Take 1 tablet by mouth daily. .  Blood Glucose Calibration (ACCU-CHEK AVIVA) SOLN, Use as directed .  Blood Glucose Monitoring Suppl (ACCU-CHEK AVIVA PLUS) w/Device KIT, Use as directed to check blood sugars daily Dx E11.9 .  buPROPion (WELLBUTRIN SR) 150 MG 12 hr tablet, Take 1 tablet (150 mg total) by mouth 2 (two) times daily. Marland Kitchen  CALCIUM PO, Take 1 tablet by mouth daily. '600mg'$  .  cephALEXin (KEFLEX) 500 MG capsule, Take 1 capsule (500 mg total) by mouth 2 (two) times daily. Marland Kitchen  escitalopram (LEXAPRO) 10 MG tablet, Take 1 tablet (10 mg total) by mouth daily. Marland Kitchen  gabapentin (NEURONTIN) 400 MG capsule, TAKE 1 CAPSULE FOUR TIMES DAILY .  Multiple Vitamins-Minerals (MULTIVITAMINS THER. W/MINERALS) TABS, Take 1 tablet by mouth daily.  .  ondansetron (ZOFRAN) 4 MG tablet, Take 1 tablet (4 mg total) by mouth every 8 (eight) hours as needed for nausea or vomiting. .  potassium chloride SA (K-DUR,KLOR-CON) 20 MEQ tablet, TAKE 1 TABLET EVERY DAY .  saccharomyces boulardii (FLORASTOR) 250 MG capsule, Take 1 capsule (250 mg total) by mouth 2 (two) times daily. .  silver sulfADIAZINE (SILVADENE) 1 % cream, Apply 1 application topically daily. Marland Kitchen  VITAMIN D, CHOLECALCIFEROL, PO, Take 1,200 Units by mouth daily.    Past medical history, social, surgical and family history all reviewed in electronic medical  record.  No pertanent information unless stated regarding to the chief complaint.   Review of Systems:  No headache, visual changes, nausea, vomiting, diarrhea, constipation, dizziness, abdominal pain, skin rash, fevers, chills, night sweats, weight loss, swollen lymph nodes, body aches, joint swelling, muscle aches, chest pain, shortness of breath, mood changes.   Objective  Blood pressure (!) 160/94, pulse 81, height '5\' 6"'$  (1.676 m), weight 247 lb (112 kg), SpO2 98 %.    General: No apparent distress alert and oriented x3 mood and affect normal, dressed appropriately.  HEENT: Pupils equal, extraocular movements intact  Respiratory: Patient's speak in full sentences and does not appear short of breath  Cardiovascular: 1+ lower extremity edema, non tender, no erythema  Skin: Warm dry intact with no signs of infection or rash on extremities or on axial skeleton.  Abdomen: Soft nontender  Neuro: Cranial nerves II through XII are intact, neurovascularly intact in all extremities does have some mild peripheral neuropathy. Lymph: No lymphadenopathy of posterior or anterior cervical chain or axillae bilaterally.  Gait severely antalgic MSK:  tender with limited range of motion a of shoulders, elbows, wrist, hip, and ankles bilaterally.  Arthritic changes of multiple joints.  Left foot exam shows the patient does have what appears to be morbid charcot foot.  First toe does have some swelling compared to the other toes.  Legs.  Severely tender to palpation.  Dorsalis pedis pulse on the side significantly weak.  So was also the posterior tibialis.  Patient's toes are mildly cool to palpation.    Impression and Recommendations:     This case required medical decision making of moderate complexity. The above documentation has been reviewed and is accurate and complete Lyndal Pulley, DO       Note: This dictation was prepared with Dragon dictation along with smaller phrase  technology. Any  transcriptional errors that result from this process are unintentional.

## 2018-02-15 ENCOUNTER — Encounter: Payer: Self-pay | Admitting: Family Medicine

## 2018-02-15 ENCOUNTER — Ambulatory Visit (INDEPENDENT_AMBULATORY_CARE_PROVIDER_SITE_OTHER)
Admission: RE | Admit: 2018-02-15 | Discharge: 2018-02-15 | Disposition: A | Discharge status: Home / Self Care | Payer: Medicare HMO | Source: Ambulatory Visit | Attending: Family Medicine | Admitting: Family Medicine

## 2018-02-15 ENCOUNTER — Ambulatory Visit (INDEPENDENT_AMBULATORY_CARE_PROVIDER_SITE_OTHER): Payer: Medicare HMO | Admitting: Family Medicine

## 2018-02-15 VITALS — BP 160/94 | HR 81 | Ht 66.0 in | Wt 247.0 lb

## 2018-02-15 DIAGNOSIS — M79675 Pain in left toe(s): Secondary | ICD-10-CM

## 2018-02-15 DIAGNOSIS — M79604 Pain in right leg: Secondary | ICD-10-CM

## 2018-02-15 DIAGNOSIS — M79605 Pain in left leg: Secondary | ICD-10-CM

## 2018-02-15 DIAGNOSIS — S99922A Unspecified injury of left foot, initial encounter: Secondary | ICD-10-CM | POA: Diagnosis not present

## 2018-02-15 DIAGNOSIS — M79672 Pain in left foot: Secondary | ICD-10-CM

## 2018-02-15 DIAGNOSIS — M216X9 Other acquired deformities of unspecified foot: Secondary | ICD-10-CM | POA: Diagnosis not present

## 2018-02-15 MED ORDER — COLCHICINE 0.6 MG PO TABS: 0.6000 mg | ORAL_TABLET | Freq: Two times a day (BID) | ORAL | 2 refills | Status: DC

## 2018-02-15 NOTE — Patient Instructions (Addendum)
Good to see you  Try to pick up colchicine and if ok take it 2 times a day for 5 days New boot We will get test to check the blood flow in the feet  pennsaid pinkie amount topically 2 times daily as needed.  Xray downstairs See me again in 2-3 weeks

## 2018-02-15 NOTE — Assessment & Plan Note (Signed)
Severe breakdown of the foot.  Causing arthritic changes.  Concern for the cellulitis of the large toe.  Discussed with patient x-rays ordered today.  History of deep venous thrombosis as well.  ABIs ordered today.  Discussed the possibility of a antibiotic but patient would like to stop and monitor and see how patient responds first.  Patient feels there was no break in the skin initially.  Patient will have close follow-up in the next 2 weeks.  CAM Walker given today as well.

## 2018-02-15 NOTE — Assessment & Plan Note (Addendum)
Significant swelling noted.  Concern for potential peripheral vascular disease.  Also need to rule out any type of osteomyelitis secondary to patient's other comorbidities.  History of peripheral neuropathy.  Patient does appear to have a trocar foot.  CAM Walker given today and see if this will be more comfortable.  Concern for potential gout and given a prescription for colchicine for 5 days.  X-rays ordered today to rule out any other type of fracture.  Following up again in 2 to 3 weeks

## 2018-02-17 ENCOUNTER — Other Ambulatory Visit: Payer: Self-pay | Admitting: Family Medicine

## 2018-02-17 DIAGNOSIS — M79604 Pain in right leg: Secondary | ICD-10-CM

## 2018-02-17 DIAGNOSIS — M79605 Pain in left leg: Secondary | ICD-10-CM

## 2018-02-17 MED ORDER — DOXYCYCLINE HYCLATE 100 MG PO TABS: 100.0000 mg | ORAL_TABLET | Freq: Two times a day (BID) | ORAL | 0 refills | Status: AC

## 2018-02-17 NOTE — Progress Notes (Signed)
Question infection of the toe.

## 2018-02-23 ENCOUNTER — Ambulatory Visit (HOSPITAL_COMMUNITY)
Admission: RE | Admit: 2018-02-23 | Discharge: 2018-02-23 | Disposition: A | Discharge status: Home / Self Care | Payer: Medicare HMO | Source: Ambulatory Visit | Attending: Cardiology | Admitting: Cardiology

## 2018-02-23 DIAGNOSIS — M79605 Pain in left leg: Secondary | ICD-10-CM

## 2018-02-23 DIAGNOSIS — M79604 Pain in right leg: Secondary | ICD-10-CM

## 2018-03-02 ENCOUNTER — Ambulatory Visit: Payer: Medicare HMO | Admitting: Family Medicine

## 2018-03-09 ENCOUNTER — Ambulatory Visit: Payer: Medicare HMO | Admitting: Family Medicine

## 2018-03-09 ENCOUNTER — Encounter: Payer: Self-pay | Admitting: Family Medicine

## 2018-03-21 NOTE — Progress Notes (Signed)
[name] [address] [place], [place] [address] Phone: [phone] Subjective:    I [name] am serving as a Education administrator for Dr. Hulan Saas.   CC: Left foot pain  [id number]  [name] is a 62 y.o. female coming in with complaint of left foot pain. Hip and leg pain as well. Swelling has gone down in the foot but she is still painful. Hip and leg weakness. Balance is off. States that she can't pick up her right leg. Has fallen twice last week (Wednesday and Friday). Right foot got caught in the door way when she fell on her side rolling on her shoulder. Looses her balance when she gets up.  Onset- 1 week Character- Really bad ache that feels like a pull  Aggravating factors- standing, walking Severity-7 out of 10   Patient was seen previously in the left toe wound did have some swelling noted.  Patient did have an x-ray that showed a mild lucency and potential infection.  Was given antibiotics.  Past Medical History:  Diagnosis Date  . Anemia    takes Ferrous Sulfate daily  . Anxiety    takes Citalopram daily  . Arthritis   . Asthma    2004-prior to gastric bypass and no problems since  . CHF (congestive heart failure) (HCC)    takes Lasix daily as needed  . Chronic back pain    spondylolisthesis/stenosis/radiculopathy  . Complication of anesthesia yrs ago   slow to wake up  . Depression   . Diabetes ([place])   . DVT (deep venous thrombosis) ([place]) 01-17-13   past hx. -tx.5-6 yrs ago bilateral legs, occ. sporadic swelling, has IVC filter implanted  . Dysrhythmia    "heart tends to flutter"  . Fibromyalgia   . Fracture    right foot and is in a cam boot  . Gallstones   . GERD (gastroesophageal reflux disease)    hx of-no meds now  . Heart murmur   . History of bronchitis 2012 or 2013  . History of colon polyps   . Hypertension    takes Metoprolol daily  . Insomnia    takes Melatonin daily  . Joint pain   . Joint  swelling   . Pelvic floor dysfunction   . Peripheral neuropathy   . Pneumonia 90's   hx of  . S/P gastric bypass   . Sleep apnea    no cpap used in many yrs after weight lost-no machine now  . Urinary frequency   . Urinary urgency   . Vitamin D deficiency    Past Surgical History:  Procedure Laterality Date  . BALLOON DILATION N/A 01/31/2013   Procedure: BALLOON DILATION;  Surgeon: Arta Silence, MD;  [facility];  Service: Endoscopy;  Laterality: N/A;  . CHOLECYSTECTOMY  2007  . COLONOSCOPY    . DILATION AND CURETTAGE OF UTERUS  yrs ago  . ESOPHAGOGASTRODUODENOSCOPY (EGD) WITH PROPOFOL  03/01/2012   Procedure: ESOPHAGOGASTRODUODENOSCOPY (EGD) WITH PROPOFOL;  Surgeon: Arta Silence, MD;  [facility];  Service: Endoscopy;  Laterality: N/A;  . ESOPHAGOGASTRODUODENOSCOPY (EGD) WITH PROPOFOL N/A 01/31/2013   Procedure: ESOPHAGOGASTRODUODENOSCOPY (EGD) WITH PROPOFOL;  Surgeon: Arta Silence, MD;  [facility];  Service: Endoscopy;  Laterality: N/A;  . ESOPHAGOGASTRODUODENOSCOPY (EGD) WITH PROPOFOL N/A 09/02/2015   Procedure: ESOPHAGOGASTRODUODENOSCOPY (EGD) WITH PROPOFOL;  Surgeon: Gatha Mayer, MD;  [facility];  Service: Endoscopy;  Laterality: N/A;  . GASTRIC BY-PASS  2004  .  HERNIA REPAIR  2005  . INSERTION OF VENA CAVA FILTER  01-17-13   inserted 2004- "abdomen"  . LIGAMENT REPAIR Right 1987   Rt. knee scope  . MAXIMUM ACCESS (MAS)POSTERIOR LUMBAR INTERBODY FUSION (PLIF) 2 LEVEL N/A 06/07/2014   Procedure: L4-5 L5-S1 FOR MAXIMUM ACCESS (MAS) POSTERIOR LUMBAR INTERBODY FUSION ;  Surgeon: Erline Levine, MD;  Location: [facility];  Service: Neurosurgery;  Laterality: N/A;  L4-5 L5-S1 FOR MAXIMUM ACCESS (MAS) POSTERIOR LUMBAR INTERBODY FUSION   . TONSILLECTOMY     as child   Social History   Socioeconomic History  . Marital status: Widowed    Spouse name: Not on file  . Number of children: 0  . Years of education: college  . Highest  education level: Not on file  Occupational History  . Occupation: retired  Scientific laboratory technician  . Financial resource strain: Not on file  . Food insecurity:    Worry: Not on file    Inability: Not on file  . Transportation needs:    Medical: Not on file    Non-medical: Not on file  Tobacco Use  . Smoking status: Former Smoker    Packs/day: 0.50    Years: 10.00    Pack years: 5.00    Types: Cigarettes    Last attempt to quit: 04/19/2009    Years since quitting: 8.9  . Smokeless tobacco: Never Used  Substance and Sexual Activity  . Alcohol use: Yes    Alcohol/week: 0.0 standard drinks    Comment: occ. social- wine-1 drink monthly  . Drug use: No  . Sexual activity: Not Currently  Lifestyle  . Physical activity:    Days per week: Not on file    Minutes per session: Not on file  . Stress: Not on file  Relationships  . Social connections:    Talks on phone: Not on file    Gets together: Not on file    Attends religious service: Not on file    Active member of club or organization: Not on file    Attends meetings of clubs or organizations: Not on file    Relationship status: Not on file  Other Topics Concern  . Not on file  Social History Narrative  . Not on file   Allergies  Allergen Reactions  . Carafate [Sucralfate] Hives    Other reaction(s): Angioedema (ALLERGY/intolerance) hives scratching  . Sulfonamide Derivatives Rash    REACTION: Syncope   Family History  Problem Relation Age of Onset  . Diabetes Mother   . Heart disease Father   . Hypertension Father   . Prostate cancer Father   . Kidney disease Brother   . Colon cancer Neg Hx   . Colon polyps Neg Hx   . Stomach cancer Neg Hx   . Esophageal cancer Neg Hx   . Pancreatic cancer Neg Hx   . Liver disease Neg Hx      Current Outpatient Medications (Cardiovascular):  .  furosemide (LASIX) 40 MG tablet, TAKE 1 TABLET DAILY AS NEEDED FOR EDEMA   Current Outpatient Medications (Analgesics):  .  traMADol  (ULTRAM) 50 MG tablet, TAKE 1 TABLET(50 MG) BY MOUTH FOUR TIMES DAILY   Current Outpatient Medications (Other):  Marland Kitchen  ACCU-CHEK AVIVA PLUS test strip, USE  TO CHECK BLOOD SUGAR TWICE DAILY .  ACCU-CHEK SOFTCLIX LANCETS lancets, CHECK BLOOD SUGARS TWICE DAILY .  Alcohol Swabs (B-D SINGLE USE SWABS REGULAR) PADS, USE TO WIPE AREA TO CHECK BLOOD SUGAR DAILY  .  BLACK COHOSH PO, Take 1 tablet by mouth daily. .  Blood Glucose Calibration (ACCU-CHEK AVIVA) SOLN, Use as directed .  Blood Glucose Monitoring Suppl (ACCU-CHEK AVIVA PLUS) w/Device KIT, Use as directed to check blood sugars daily Dx E11.9 .  buPROPion (WELLBUTRIN SR) 150 MG 12 hr tablet, Take 1 tablet (150 mg total) by mouth 2 (two) times daily. Marland Kitchen  gabapentin (NEURONTIN) 400 MG capsule, TAKE 1 CAPSULE FOUR TIMES DAILY .  Multiple Vitamins-Minerals (MULTIVITAMINS THER. W/MINERALS) TABS, Take 1 tablet by mouth daily.     Past medical history, social, surgical and family history all reviewed in electronic medical record.  No pertanent information unless stated regarding to the chief complaint.   Review of Systems:  No headache, visual changes, nausea, vomiting, diarrhea, constipation, dizziness, abdominal pain, skin rash, fevers, chills, night sweats, weight loss, swollen lymph nodes,  chest pain, shortness of breath, mood changes.  Positive muscle aches, body aches, joint swelling  Objective  Blood pressure (!) 162/92, pulse 70, height _0  (1.676 m), weight 259 lb (117.5 kg), SpO2 97 %.   General: No apparent distress alert and oriented x3 mood and affect normal, dressed appropriately.  HEENT: Pupils equal, extraocular movements intact  Respiratory: Patient's speak in full sentences and does not appear short of breath  Cardiovascular: 1+ lower extremity edema, Skin: Warm dry intact with no signs of infection or rash on extremities or on axial skeleton.  Abdomen: Soft nontender  Neuro: Cranial nerves II through XII are intact,  Lymph:  No lymphadenopathy of posterior or anterior cervical chain or axillae bilaterally.  Gait antalgic gait severely protecting patient's left foot with an antalgic gait.  Walking with the aid of a cane though. MSK: Diffuse tenderness in the patient's muscles.  Patient does have some limited range of motion in all planes.  Pain seems to be out of proportion to the amount of palpation into the musculature.  Patient's foot has significant chronic osteoarthritic changes.  She cut foot noted secondary to peripheral neuropathy.  Peripheral neuropathy noted.  Patient does have 1+ distal pulses noted of this foot.    Impression and Recommendations:     This case required medical decision making of moderate complexity. The above documentation has been reviewed and is accurate and complete Lyndal Pulley, DO       Note: This dictation was prepared with Dragon dictation along with smaller phrase technology. Any transcriptional errors that result from this process are unintentional.

## 2018-03-22 ENCOUNTER — Encounter: Payer: Self-pay | Admitting: Family Medicine

## 2018-03-22 ENCOUNTER — Ambulatory Visit: Payer: Medicare HMO | Admitting: Family Medicine

## 2018-03-22 ENCOUNTER — Other Ambulatory Visit (INDEPENDENT_AMBULATORY_CARE_PROVIDER_SITE_OTHER): Payer: Medicare HMO

## 2018-03-22 VITALS — BP 162/92 | HR 70 | Ht 66.0 in | Wt 259.0 lb

## 2018-03-22 DIAGNOSIS — M2550 Pain in unspecified joint: Secondary | ICD-10-CM

## 2018-03-22 DIAGNOSIS — M216X9 Other acquired deformities of unspecified foot: Secondary | ICD-10-CM

## 2018-03-22 DIAGNOSIS — L03032 Cellulitis of left toe: Secondary | ICD-10-CM | POA: Diagnosis not present

## 2018-03-22 DIAGNOSIS — M79675 Pain in left toe(s): Secondary | ICD-10-CM | POA: Diagnosis not present

## 2018-03-22 LAB — URINALYSIS
Bilirubin Urine: NEGATIVE
HGB URINE DIPSTICK: NEGATIVE
Ketones, ur: NEGATIVE
Leukocytes, UA: NEGATIVE
NITRITE: NEGATIVE
Specific Gravity, Urine: 1.015 (ref 1.000–1.030)
TOTAL PROTEIN, URINE-UPE24: NEGATIVE
Urine Glucose: NEGATIVE
Urobilinogen, UA: 0.2 (ref 0.0–1.0)
pH: 6 (ref 5.0–8.0)

## 2018-03-22 LAB — CBC WITH DIFFERENTIAL/PLATELET
BASOS ABS: 0 10*3/uL (ref 0.0–0.1)
Basophils Relative: 0.7 % (ref 0.0–3.0)
Eosinophils Absolute: 0 10*3/uL (ref 0.0–0.7)
Eosinophils Relative: 0.8 % (ref 0.0–5.0)
HEMATOCRIT: 36.8 % (ref 36.0–46.0)
HEMOGLOBIN: 11.8 g/dL — AB (ref 12.0–15.0)
LYMPHS PCT: 34.3 % (ref 12.0–46.0)
Lymphs Abs: 2 10*3/uL (ref 0.7–4.0)
MCHC: 32 g/dL (ref 30.0–36.0)
MCV: 83.5 fl (ref 78.0–100.0)
MONOS PCT: 5.6 % (ref 3.0–12.0)
Monocytes Absolute: 0.3 10*3/uL (ref 0.1–1.0)
NEUTROS ABS: 3.4 10*3/uL (ref 1.4–7.7)
Neutrophils Relative %: 58.6 % (ref 43.0–77.0)
PLATELETS: 227 10*3/uL (ref 150.0–400.0)
RBC: 4.4 Mil/uL (ref 3.87–5.11)
RDW: 17.2 % — ABNORMAL HIGH (ref 11.5–15.5)
WBC: 5.8 10*3/uL (ref 4.0–10.5)

## 2018-03-22 LAB — C-REACTIVE PROTEIN: CRP: 0.1 mg/dL — ABNORMAL LOW (ref 0.5–20.0)

## 2018-03-22 LAB — SEDIMENTATION RATE: Sed Rate: 21 mm/hr (ref 0–30)

## 2018-03-22 LAB — URIC ACID: Uric Acid, Serum: 4.5 mg/dL (ref 2.4–7.0)

## 2018-03-22 MED ORDER — CEFTRIAXONE SODIUM 500 MG IJ SOLR
500.0000 mg | Freq: Once | INTRAMUSCULAR | Status: AC
  Administered 2018-03-22: 500 mg via INTRAMUSCULAR

## 2018-03-22 MED ORDER — KETOROLAC TROMETHAMINE 60 MG/2ML IM SOLN
60.0000 mg | Freq: Once | INTRAMUSCULAR | Status: AC
  Administered 2018-03-22: 60 mg via INTRAMUSCULAR

## 2018-03-22 NOTE — Addendum Note (Signed)
Addended by: Douglass Rivers T on: 03/22/2018 03:23 PM   Modules accepted: Orders

## 2018-03-22 NOTE — Patient Instructions (Addendum)
Good to see you  2 injections today to help with pain and an antibiotic.  Also will restart the Doxycycline 2 times a day for 3 weeks.  We will get labs downstairs as well  See me again in 1-2 weeks(ok to doubel book) If worsening pain please go to the emergency room

## 2018-03-22 NOTE — Assessment & Plan Note (Signed)
Patient has toe pain again.  Patient's x-rays were concerning for potential small infection.  Patient did not have anything fine discussed icing regimen and home exercises.  Patient though does also have Charcot foot.  We have checked vascularity to the foot previously that was good blood flow.  Patient will be put on another 3-week of doxycycline.  Laboratory work-up added to see if there is any type of systemic illnesses causing more the pain.  Patient will be following up again in 1 to 2 weeks.  Differential does include a peripheral neuropathy or patient's fibromyalgia.

## 2018-03-23 ENCOUNTER — Encounter: Payer: Self-pay | Admitting: Family Medicine

## 2018-03-25 ENCOUNTER — Ambulatory Visit (HOSPITAL_COMMUNITY)
Admission: EM | Admit: 2018-03-25 | Discharge: 2018-03-25 | Disposition: A | Discharge status: Home / Self Care | Payer: Medicare HMO | Attending: Internal Medicine | Admitting: Internal Medicine

## 2018-03-25 ENCOUNTER — Encounter (HOSPITAL_COMMUNITY): Payer: Self-pay

## 2018-03-25 DIAGNOSIS — M79604 Pain in right leg: Secondary | ICD-10-CM | POA: Diagnosis not present

## 2018-03-25 DIAGNOSIS — M5489 Other dorsalgia: Secondary | ICD-10-CM

## 2018-03-25 MED ORDER — KETOROLAC TROMETHAMINE 30 MG/ML IJ SOLN
INTRAMUSCULAR | Status: AC
  Filled 2018-03-25: qty 1

## 2018-03-25 MED ORDER — DICLOFENAC SODIUM 75 MG PO TBEC: 75.0000 mg | DELAYED_RELEASE_TABLET | Freq: Two times a day (BID) | ORAL | 0 refills | Status: DC

## 2018-03-25 MED ORDER — KETOROLAC TROMETHAMINE 30 MG/ML IJ SOLN
30.0000 mg | Freq: Once | INTRAMUSCULAR | Status: AC
Start: 2018-03-25 — End: 2018-03-25
  Administered 2018-03-25: 30 mg via INTRAMUSCULAR

## 2018-03-25 NOTE — ED Triage Notes (Signed)
Pt present pain in both of her legs.  Pt states she had a fall around thanksgiving but did not report to the doctor.

## 2018-03-25 NOTE — ED Provider Notes (Addendum)
[facility]    CSN: [id number] Arrival date & time: 03/25/18  1425     History   Chief Complaint Chief Complaint  Patient presents with  . Appointment    230  . Leg Pain    HPI [name] is a 62 y.o. female.   62 year old female presents with right hip pain radiates down leg patient describes the pain as persistent and worsening in nature x several months.. Pateint reports that the pain causes her to be off balance and states that she fell the two weeks ago. Patient decribes it "as my foot is dragging" patient has a history of back L4 L5 surgery.  Patient was seen by sports medicine 3 days ago for similar complaints and left foot pain.  Patient states that her pain improved after an injection of pain medicine however her pain returned the next day.  Patient denies any issues with the foot infection that was also treated the same time.  Patient has a history of diabetes and states that she takes gabapentin 3 times a day for neuropathy.     Past Medical History:  Diagnosis Date  . Anemia    takes Ferrous Sulfate daily  . Anxiety    takes Citalopram daily  . Arthritis   . Asthma    2004-prior to gastric bypass and no problems since  . CHF (congestive heart failure) (HCC)    takes Lasix daily as needed  . Chronic back pain    spondylolisthesis/stenosis/radiculopathy  . Complication of anesthesia yrs ago   slow to wake up  . Depression   . Diabetes ([place])   . DVT (deep venous thrombosis) ([place]) 01-17-13   past hx. -tx.5-6 yrs ago bilateral legs, occ. sporadic swelling, has IVC filter implanted  . Dysrhythmia    "heart tends to flutter"  . Fibromyalgia   . Fracture    right foot and is in a cam boot  . Gallstones   . GERD (gastroesophageal reflux disease)    hx of-no meds now  . Heart murmur   . History of bronchitis 2012 or 2013  . History of colon polyps   . Hypertension    takes Metoprolol daily  . Insomnia    takes Melatonin daily  . Joint  pain   . Joint swelling   . Pelvic floor dysfunction   . Peripheral neuropathy   . Pneumonia 90's   hx of  . S/P gastric bypass   . Sleep apnea    no cpap used in many yrs after weight lost-no machine now  . Urinary frequency   . Urinary urgency   . Vitamin D deficiency     Patient Active Problem List   Diagnosis Date Noted  . Toe pain, left 01/20/2018  . Right leg pain 12/16/2017  . Cellulitis 12/16/2017  . History of deep vein thrombosis (DVT) of lower extremity 12/12/2017  . Cavovarus deformity of foot, acquired, unspecified laterality 04/07/2017  . Acute kidney injury ([place]) 12/19/2016  . S/P gastric bypass 08/18/2016  . Muscle cramps 07/09/2016  . Dysuria 05/26/2016  . Adjustment disorder with mixed anxiety and depressed mood 03/05/2016  . Weakness 03/05/2016  . Cervical disc disorder with radiculopathy of cervical region 02/27/2016  . Degenerative arthritis of right knee 02/27/2016  . Right knee pain 02/24/2016  . Left shoulder pain 02/24/2016  . Cough 02/24/2016  . Routine general medical examination at a health care facility 12/12/2015  . Schatzki's ring   . Lumbar radiculopathy  03/05/2015  . Peripheral neuropathy 01/17/2015  . Fibromyalgia 02/21/2011  . Essential hypertension 12/04/2006  . Hx of diabetes mellitus 10/20/2006  . Morbid obesity ([place]) 10/20/2006  . MDD (major depressive disorder) ([place]) 10/20/2006  . OBSTRUCTIVE SLEEP APNEA 10/20/2006  . OSTEOARTHRITIS 10/20/2006  . DVT, HX OF 10/20/2006    Past Surgical History:  Procedure Laterality Date  . BALLOON DILATION N/A 01/31/2013   Procedure: BALLOON DILATION;  Surgeon: Arta Silence, MD;  Location: [facility];  Service: Endoscopy;  Laterality: N/A;  . CHOLECYSTECTOMY  2007  . COLONOSCOPY    . DILATION AND CURETTAGE OF UTERUS  yrs ago  . ESOPHAGOGASTRODUODENOSCOPY (EGD) WITH PROPOFOL  03/01/2012   Procedure: ESOPHAGOGASTRODUODENOSCOPY (EGD) WITH PROPOFOL;  Surgeon: Arta Silence, MD;  Location:  [facility];  Service: Endoscopy;  Laterality: N/A;  . ESOPHAGOGASTRODUODENOSCOPY (EGD) WITH PROPOFOL N/A 01/31/2013   Procedure: ESOPHAGOGASTRODUODENOSCOPY (EGD) WITH PROPOFOL;  Surgeon: Arta Silence, MD;  [facility];  Service: Endoscopy;  Laterality: N/A;  . ESOPHAGOGASTRODUODENOSCOPY (EGD) WITH PROPOFOL N/A 09/02/2015   Procedure: ESOPHAGOGASTRODUODENOSCOPY (EGD) WITH PROPOFOL;  Surgeon: Gatha Mayer, MD;  [facility];  Service: Endoscopy;  Laterality: N/A;  . GASTRIC BY-PASS  2004  . HERNIA REPAIR  2005  . INSERTION OF VENA CAVA FILTER  01-17-13   inserted 2004- "abdomen"  . LIGAMENT REPAIR Right 1987   Rt. knee scope  . MAXIMUM ACCESS (MAS)POSTERIOR LUMBAR INTERBODY FUSION (PLIF) 2 LEVEL N/A 06/07/2014   Procedure: L4-5 L5-S1 FOR MAXIMUM ACCESS (MAS) POSTERIOR LUMBAR INTERBODY FUSION ;  Surgeon: Erline Levine, MD;  Location: [facility];  Service: Neurosurgery;  Laterality: N/A;  L4-5 L5-S1 FOR MAXIMUM ACCESS (MAS) POSTERIOR LUMBAR INTERBODY FUSION   . TONSILLECTOMY     as child    OB History   None      Home Medications    Prior to Admission medications   Medication Sig Start Date End Date Taking? Authorizing Provider  ACCU-CHEK AVIVA PLUS test strip USE  TO CHECK BLOOD SUGAR TWICE DAILY 01/26/17   Hoyt Koch, MD  ACCU-CHEK SOFTCLIX LANCETS lancets CHECK BLOOD SUGARS TWICE DAILY 01/26/17   Hoyt Koch, MD  Alcohol Swabs (B-D SINGLE USE SWABS REGULAR) PADS USE TO WIPE AREA TO CHECK BLOOD SUGAR DAILY  01/19/18   Hoyt Koch, MD  BLACK COHOSH PO Take 1 tablet by mouth daily.    [provider]  Blood Glucose Calibration (ACCU-CHEK AVIVA) SOLN Use as directed 10/29/15   Hoyt Koch, MD  Blood Glucose Monitoring Suppl (ACCU-CHEK AVIVA PLUS) w/Device KIT Use as directed to check blood sugars daily Dx E11.9 10/29/15   Hoyt Koch, MD  buPROPion [facility] SR) 150 MG 12 hr tablet Take 1 tablet (150 mg  total) by mouth 2 (two) times daily. 10/18/17   Hoyt Koch, MD  furosemide (LASIX) 40 MG tablet TAKE 1 TABLET DAILY AS NEEDED FOR EDEMA 01/26/17   Hoyt Koch, MD  gabapentin (NEURONTIN) 400 MG capsule TAKE 1 CAPSULE FOUR TIMES DAILY 01/26/17   Hoyt Koch, MD  Multiple Vitamins-Minerals (MULTIVITAMINS THER. W/MINERALS) TABS Take 1 tablet by mouth daily.     [provider]  traMADol (ULTRAM) 50 MG tablet TAKE 1 TABLET(50 MG) BY MOUTH FOUR TIMES DAILY 02/13/18   Hoyt Koch, MD    Family History Family History  Problem Relation Age of Onset  . Diabetes Mother   . Heart disease Father   . Hypertension Father   .  Prostate cancer Father   . Kidney disease Brother   . Colon cancer Neg Hx   . Colon polyps Neg Hx   . Stomach cancer Neg Hx   . Esophageal cancer Neg Hx   . Pancreatic cancer Neg Hx   . Liver disease Neg Hx     Social History Social History   Tobacco Use  . Smoking status: Former Smoker    Packs/day: 0.50    Years: 10.00    Pack years: 5.00    Types: Cigarettes    Last attempt to quit: 04/19/2009    Years since quitting: 8.9  . Smokeless tobacco: Never Used  Substance Use Topics  . Alcohol use: Yes    Alcohol/week: 0.0 standard drinks    Comment: occ. social- wine-1 drink monthly  . Drug use: No     Allergies   Carafate [sucralfate] and Sulfonamide derivatives   Review of Systems Review of Systems  Constitutional: Negative for chills and fever.  HENT: Negative for ear pain and sore throat.   Eyes: Negative for pain and visual disturbance.  Respiratory: Negative for cough and shortness of breath.   Cardiovascular: Negative for chest pain and palpitations.  Gastrointestinal: Negative for abdominal pain and vomiting.  Genitourinary: Negative for dysuria and hematuria.  Musculoskeletal: Positive for back pain. Negative for arthralgias.       Radiating down left leg.   Skin: Negative for color change and rash.    Neurological: Negative for seizures and syncope.  All other systems reviewed and are negative.    Physical Exam Triage Vital Signs ED Triage Vitals  Enc Vitals Group     BP 03/25/18 1452 140/80     Pulse Rate 03/25/18 1452 64     Resp 03/25/18 1452 16     Temp 03/25/18 1452 98.1 F (36.7 C)     Temp Source 03/25/18 1452 Oral     SpO2 03/25/18 1452 99 %     Weight --      Height --      Head Circumference --      Peak Flow --      Pain Score 03/25/18 1456 8     Pain Loc --      Pain Edu? --      Excl. in [place]? --    No data found.  Updated Vital Signs BP 140/80 (BP Location: Right Arm)   Pulse 64   Temp 98.1 F (36.7 C) (Oral)   Resp 16   SpO2 99%   Visual Acuity Right Eye Distance:   Left Eye Distance:   Bilateral Distance:    Right Eye Near:   Left Eye Near:    Bilateral Near:     Physical Exam  Constitutional: She is oriented to person, place, and time. She appears well-developed and well-nourished.  HENT:  Head: Normocephalic and atraumatic.  Eyes: Conjunctivae are normal.  Neck: Normal range of motion.  Pulmonary/Chest: Effort normal.  Neurological: She is alert and oriented to person, place, and time.  Psychiatric: She has a normal mood and affect.  Nursing note and vitals reviewed.    UC Treatments / Results  Labs (all labs ordered are listed, but only abnormal results are displayed) Labs Reviewed - No data to display  EKG None  Radiology No results found.  Procedures Procedures (including critical care time)  Medications Ordered in UC Medications - No data to display  Initial Impression / Assessment and Plan / UC Course  I have  reviewed the triage vital signs and the nursing notes.  Pertinent labs & imaging results that were available during my care of the patient were reviewed by me and considered in my medical decision making (see chart for details).      Final Clinical Impressions(s) / UC Diagnoses   Final diagnoses:  None    Discharge Instructions   None    ED Prescriptions    None     Controlled Substance Prescriptions [place] Controlled Substance Registry consulted? Not Applicable   Jacqualine Mau, NP 03/25/18 1551    Jacqualine Mau, NP 03/25/18 1551    Jacqualine Mau, NP 03/25/18 1552

## 2018-03-29 ENCOUNTER — Ambulatory Visit
Admission: RE | Admit: 2018-03-29 | Discharge: 2018-03-29 | Disposition: A | Discharge status: Home / Self Care | Payer: Medicare HMO | Source: Ambulatory Visit | Attending: Family Medicine | Admitting: Family Medicine

## 2018-03-29 DIAGNOSIS — M216X9 Other acquired deformities of unspecified foot: Secondary | ICD-10-CM

## 2018-03-29 DIAGNOSIS — S92315A Nondisplaced fracture of first metatarsal bone, left foot, initial encounter for closed fracture: Secondary | ICD-10-CM | POA: Diagnosis not present

## 2018-03-29 DIAGNOSIS — M79675 Pain in left toe(s): Secondary | ICD-10-CM

## 2018-03-29 DIAGNOSIS — L03032 Cellulitis of left toe: Secondary | ICD-10-CM

## 2018-03-30 ENCOUNTER — Encounter: Payer: Self-pay | Admitting: Family Medicine

## 2018-03-30 ENCOUNTER — Other Ambulatory Visit: Payer: Self-pay | Admitting: Internal Medicine

## 2018-03-30 MED ORDER — ACCU-CHEK AVIVA PLUS W/DEVICE KIT: PACK | 0 refills | Status: DC

## 2018-03-30 MED ORDER — GLUCOSE BLOOD VI STRP: 1.0000 | ORAL_STRIP | Freq: Two times a day (BID) | 2 refills | Status: DC

## 2018-03-30 NOTE — Telephone Encounter (Signed)
Reviewed chart pt is up-to-date sent refills to [facility]  

## 2018-03-31 ENCOUNTER — Encounter: Payer: Self-pay | Admitting: Family Medicine

## 2018-04-04 ENCOUNTER — Other Ambulatory Visit: Payer: Self-pay

## 2018-04-04 DIAGNOSIS — M5416 Radiculopathy, lumbar region: Secondary | ICD-10-CM

## 2018-04-05 ENCOUNTER — Ambulatory Visit: Payer: Medicare HMO | Admitting: Family Medicine

## 2018-04-10 ENCOUNTER — Encounter: Payer: Self-pay | Admitting: Internal Medicine

## 2018-04-10 MED ORDER — NITROFURANTOIN MONOHYD MACRO 100 MG PO CAPS: 100.0000 mg | ORAL_CAPSULE | Freq: Two times a day (BID) | ORAL | 0 refills | Status: DC

## 2018-04-13 ENCOUNTER — Inpatient Hospital Stay: Admission: RE | Admit: 2018-04-13 | Payer: Medicare HMO | Source: Ambulatory Visit

## 2018-04-14 ENCOUNTER — Ambulatory Visit
Admission: RE | Admit: 2018-04-14 | Discharge: 2018-04-14 | Disposition: A | Discharge status: Home / Self Care | Payer: Medicare HMO | Source: Ambulatory Visit | Attending: Family Medicine | Admitting: Family Medicine

## 2018-04-14 DIAGNOSIS — M5416 Radiculopathy, lumbar region: Secondary | ICD-10-CM

## 2018-04-14 DIAGNOSIS — M47817 Spondylosis without myelopathy or radiculopathy, lumbosacral region: Secondary | ICD-10-CM | POA: Diagnosis not present

## 2018-04-14 DIAGNOSIS — M545 Low back pain: Secondary | ICD-10-CM | POA: Diagnosis not present

## 2018-04-14 MED ORDER — METHYLPREDNISOLONE ACETATE 40 MG/ML INJ SUSP (RADIOLOG
120.0000 mg | Freq: Once | INTRAMUSCULAR | Status: AC
  Administered 2018-04-14: 120 mg via EPIDURAL

## 2018-04-14 MED ORDER — IOPAMIDOL (ISOVUE-M 200) INJECTION 41%
1.0000 mL | Freq: Once | INTRAMUSCULAR | Status: AC
  Administered 2018-04-14: 1 mL via EPIDURAL

## 2018-04-14 NOTE — Discharge Instructions (Signed)

## 2018-04-23 ENCOUNTER — Other Ambulatory Visit: Payer: Self-pay | Admitting: Internal Medicine

## 2018-05-04 ENCOUNTER — Ambulatory Visit (INDEPENDENT_AMBULATORY_CARE_PROVIDER_SITE_OTHER): Payer: Medicare HMO | Admitting: Family Medicine

## 2018-05-04 ENCOUNTER — Encounter: Payer: Self-pay | Admitting: Family Medicine

## 2018-05-04 VITALS — BP 146/92 | HR 72 | Ht 66.0 in | Wt 251.0 lb

## 2018-05-04 DIAGNOSIS — N390 Urinary tract infection, site not specified: Secondary | ICD-10-CM | POA: Diagnosis not present

## 2018-05-04 DIAGNOSIS — M79675 Pain in left toe(s): Secondary | ICD-10-CM | POA: Diagnosis not present

## 2018-05-04 DIAGNOSIS — E538 Deficiency of other specified B group vitamins: Secondary | ICD-10-CM

## 2018-05-04 DIAGNOSIS — Z86718 Personal history of other venous thrombosis and embolism: Secondary | ICD-10-CM | POA: Diagnosis not present

## 2018-05-04 DIAGNOSIS — G609 Hereditary and idiopathic neuropathy, unspecified: Secondary | ICD-10-CM

## 2018-05-04 DIAGNOSIS — M5416 Radiculopathy, lumbar region: Secondary | ICD-10-CM

## 2018-05-04 MED ORDER — CYANOCOBALAMIN 1000 MCG/ML IJ SOLN
1000.0000 ug | Freq: Once | INTRAMUSCULAR | Status: AC
  Administered 2018-05-04: 1000 ug via INTRAMUSCULAR

## 2018-05-04 NOTE — Progress Notes (Signed)
[name] [address], [place] [address] Phone: [phone] Subjective:   [name], am serving as a scribe for Dr. Hulan Saas.   CC: Foot pain follow-up, back pain follow-up  ERX:[id number]  [name] is a 63 y.o. female coming in with complaint of back pain. She said that the pain in her back decreased with the epidural but that her leg pain did not diminish. Constant pain in bilateral hips from the lateral aspect radiating into the medial joint. Patient uses gabapentin and Tramadol which does not help her pain. Is using 236m Motrin daily. Feels that warm showers also help her pain.   Patient has discontinued the B12 injections but feels like she is not making significant progress.  He has had intermittent stomach pain is well recurrent urinary tract infections.  Patient feels like this could be contributing to some of her back pain.  Denies any fevers or chills at this time.  Feels like she would like evaluation by another provider.    Patient was given an epidural in April 14, 2018 in the L2-L3 area.  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 Past Medical History:  Diagnosis Date  . Anemia    takes Ferrous Sulfate daily  . Anxiety    takes Citalopram daily  . Arthritis   . Asthma    2004-prior to gastric bypass and no problems since  . CHF (congestive heart failure) (HCC)    takes Lasix daily as needed  . Chronic back pain    spondylolisthesis/stenosis/radiculopathy  . Complication of anesthesia yrs ago   slow to wake up  . Depression   . Diabetes ([place]   . [place]   past hx. -tx.5-6 yrs ago bilateral legs, occ. sporadic swelling, has IVC filter  implanted  . Dysrhythmia    "heart tends to flutter"  . Fibromyalgia   . Fracture    right foot and is in a cam boot  . Gallstones   . GERD (gastroesophageal reflux disease)    hx of-no meds now  . Heart murmur   . History of bronchitis 2012 or 2013  . History of colon polyps   . Hypertension    takes Metoprolol daily  . Insomnia    takes Melatonin daily  . Joint pain   . Joint swelling   . Pelvic floor dysfunction   . Peripheral neuropathy   . Pneumonia 90's   hx of  . S/P gastric bypass   . Sleep apnea    no cpap used in many yrs after weight lost-no machine now  . Urinary frequency   . Urinary urgency   . Vitamin D deficiency    Past Surgical History:  Procedure Laterality Date  . BALLOON DILATION N/A 01/31/2013   Procedure: BALLOON DILATION;  Surgeon: WArta Silence MD;  [facility];  Service: Endoscopy;  Laterality: N/A;  . CHOLECYSTECTOMY  2007  . COLONOSCOPY    . DILATION AND CURETTAGE OF UTERUS  yrs ago  . ESOPHAGOGASTRODUODENOSCOPY (EGD) WITH PROPOFOL  03/01/2012   Procedure: ESOPHAGOGASTRODUODENOSCOPY (EGD) WITH PROPOFOL;  Surgeon: WArta Silence MD;  [facility];  Service: Endoscopy;  Laterality: N/A;  . ESOPHAGOGASTRODUODENOSCOPY (EGD) WITH PROPOFOL  N/A 01/31/2013   Procedure: ESOPHAGOGASTRODUODENOSCOPY (EGD) WITH PROPOFOL;  Surgeon: Arta Silence, MD;  [facility];  Service: Endoscopy;  Laterality: N/A;  . ESOPHAGOGASTRODUODENOSCOPY (EGD) WITH PROPOFOL N/A 09/02/2015   Procedure: ESOPHAGOGASTRODUODENOSCOPY (EGD) WITH PROPOFOL;  Surgeon: Gatha Mayer, MD;  [facility];  Service: Endoscopy;  Laterality: N/A;  . GASTRIC BY-PASS  2004  . HERNIA REPAIR  2005  . INSERTION OF VENA CAVA FILTER  01-17-13   inserted 2004- "abdomen"  . LIGAMENT REPAIR Right 1987   Rt. knee scope  . MAXIMUM ACCESS (MAS)POSTERIOR LUMBAR INTERBODY FUSION (PLIF) 2 LEVEL N/A 06/07/2014   Procedure: L4-5 L5-S1 FOR MAXIMUM ACCESS (MAS)  POSTERIOR LUMBAR INTERBODY FUSION ;  Surgeon: Erline Levine, MD;  Location: [facility];  Service: Neurosurgery;  Laterality: N/A;  L4-5 L5-S1 FOR MAXIMUM ACCESS (MAS) POSTERIOR LUMBAR INTERBODY FUSION   . TONSILLECTOMY     as child   Social History   Socioeconomic History  . Marital status: Widowed    Spouse name: Not on file  . Number of children: 0  . Years of education: college  . Highest education level: Not on file  Occupational History  . Occupation: retired  Scientific laboratory technician  . Financial resource strain: Not on file  . Food insecurity:    Worry: Not on file    Inability: Not on file  . Transportation needs:    Medical: Not on file    Non-medical: Not on file  Tobacco Use  . Smoking status: Former Smoker    Packs/day: 0.50    Years: 10.00    Pack years: 5.00    Types: Cigarettes    Last attempt to quit: 04/19/2009    Years since quitting: 9.0  . Smokeless tobacco: Never Used  Substance and Sexual Activity  . Alcohol use: Yes    Alcohol/week: 0.0 standard drinks    Comment: occ. social- wine-1 drink monthly  . Drug use: No  . Sexual activity: Not Currently  Lifestyle  . Physical activity:    Days per week: Not on file    Minutes per session: Not on file  . Stress: Not on file  Relationships  . Social connections:    Talks on phone: Not on file    Gets together: Not on file    Attends religious service: Not on file    Active member of club or organization: Not on file    Attends meetings of clubs or organizations: Not on file    Relationship status: Not on file  Other Topics Concern  . Not on file  Social History Narrative  . Not on file   Allergies  Allergen Reactions  . Carafate [Sucralfate] Hives    Other reaction(s): Angioedema (ALLERGY/intolerance) hives scratching  . Sulfonamide Derivatives Rash    REACTION: Syncope   Family History  Problem Relation Age of Onset  . Diabetes Mother   . Heart disease Father   . Hypertension Father   . Prostate  cancer Father   . Kidney disease Brother   . Colon cancer Neg Hx   . Colon polyps Neg Hx   . Stomach cancer Neg Hx   . Esophageal cancer Neg Hx   . Pancreatic cancer Neg Hx   . Liver disease Neg Hx      Current Outpatient Medications (Cardiovascular):  .  furosemide (LASIX) 40 MG tablet, TAKE 1 TABLET DAILY AS NEEDED FOR EDEMA   Current Outpatient Medications (Analgesics):  .  diclofenac (VOLTAREN) 75 MG  EC tablet, Take 1 tablet (75 mg total) by mouth 2 (two) times daily. .  traMADol (ULTRAM) 50 MG tablet, TAKE 1 TABLET(50 MG) BY MOUTH FOUR TIMES DAILY   Current Outpatient Medications (Other):  Marland Kitchen  ACCU-CHEK SOFTCLIX LANCETS lancets, CHECK BLOOD SUGARS TWICE DAILY .  Alcohol Swabs (B-D SINGLE USE SWABS REGULAR) PADS, USE TO WIPE AREA TO CHECK BLOOD SUGAR DAILY  .  BLACK COHOSH PO, Take 1 tablet by mouth daily. .  Blood Glucose Calibration (ACCU-CHEK AVIVA) SOLN, Use as directed .  Blood Glucose Monitoring Suppl (ACCU-CHEK AVIVA PLUS) w/Device KIT, Use as directed to check blood sugars daily .  buPROPion (WELLBUTRIN SR) 150 MG 12 hr tablet, TAKE 1 TABLET(150 MG) BY MOUTH TWICE DAILY .  gabapentin (NEURONTIN) 400 MG capsule, TAKE 1 CAPSULE FOUR TIMES DAILY .  glucose blood (ACCU-CHEK AVIVA PLUS) test strip, 1 each by Other route 2 (two) times daily. Use to check blood sugars twice a day .  Multiple Vitamins-Minerals (MULTIVITAMINS THER. W/MINERALS) TABS, Take 1 tablet by mouth daily.  .  nitrofurantoin, macrocrystal-monohydrate, (MACROBID) 100 MG capsule, Take 1 capsule (100 mg total) by mouth 2 (two) times daily.    Past medical history, social, surgical and family history all reviewed in electronic medical record.  No pertanent information unless stated regarding to the chief complaint.   Review of Systems:  No headache, visual changes, nausea, vomiting, diarrhea, constipation, dizziness, skin rash, fevers, chills, night sweats, weight loss, swollen lymph nodes, body aches, joint  swelling,  chest pain, shortness of breath, mood changes.  Positive muscle aches, body aches, numbness of the extremities, abdominal pain  Objective  Blood pressure (!) 146/92, pulse 72, height 5' 6" (1.676 m), weight 251 lb (113.9 kg), SpO2 98 %.   General: No apparent distress alert and oriented x3 mood and affect normal, dressed appropriately.  HEENT: Pupils equal, extraocular movements intact  Respiratory: Patient's speak in full sentences and does not appear short of breath  Cardiovascular: 2+lower extremity edema, non tender, no erythema  Skin: Warm dry intact with no signs of infection or rash on extremities or on axial skeleton.  Abdomen: Soft severe tenderness noted in the left lower quadrant.  No rebound tenderness noted.  No masses appreciated. Neuro: Cranial nerves II through XII are intact,  Lymph: No lymphadenopathy of posterior or anterior cervical chain or axillae bilaterally.  Gait antalgic severe walking with the aid of a cane MSK:  tender with limited range of motion and good stability and symmetric strength and tone of shoulders, elbows, wrist, hip, knee and ankles bilaterally.  Back Exam:  Inspection: Loss of lordosis Motion: Flexion 20 deg, Extension 15 deg, Side Bending to 30 deg bilaterally,  Rotation to 25 deg bilaterally  SLR laying: Negative very difficult to do secondary to patient's body habitus Palpable tenderness: Tender to palpation the paraspinal musculature lumbar spine right greater than left. FABER: Unable to do secondary to body habitus. Reflexes: 1+ at both patellar tendons, 1+ at achilles tendons, Babinski's downgoing.  Strength at foot  Plantar-flexion: 4/5 on left dorsi-flexion: 5/5 Eversion: 5/5 Inversion: 5/5  Leg strength  4 out of 5 strength on the left leg compared to the right  Foot exam Does have severe osteoarthritic changes of the foot. Charcot foot noted.  Patient holds foot in a supinated mildly plantarflexed with a pes cavus noted.   Peripheral neuropathy noted.   Impression and Recommendations:     This case required medical decision making of moderate complexity.  The above documentation has been reviewed and is accurate and complete Lyndal Pulley, DO       Note: This dictation was prepared with Dragon dictation along with smaller phrase technology. Any transcriptional errors that result from this process are unintentional.

## 2018-05-04 NOTE — Patient Instructions (Signed)
Good to see you  Ice is your friend We tried a B12 Set up a nurse visit for B12 in 2 weeks Send message in 2 weeks to tell me how you are feeling.  We will get you in with Urogyn for evaluation of the pain and the recurrent infection  See me again in 6 weeks to discuss but hopefully doing better

## 2018-05-04 NOTE — Assessment & Plan Note (Addendum)
Likely contributing to some pain in the leg still  Back pain improved with epidural but not leg pain.  Patient is have worsening stomach pain, hx of AKI,   Most recent nerve conduction study shows  1. The electrophysiologic findings are most consistent with a distal and symmetric sensorimotor polyneuropathy, axon loss in type, affecting the lower extremities. Overall, these findings are moderate in degree electrically. 2. Chronic S1 radiculopathy affecting bilateral lower extremities; moderate in degree electrically. 3. Chronic L2-L4 radiculopathy affecting bilateral lower extremities; moderate in degree electrically  Restarting B12 encourage weight loss

## 2018-05-05 DIAGNOSIS — N390 Urinary tract infection, site not specified: Secondary | ICD-10-CM | POA: Insufficient documentation

## 2018-05-05 NOTE — Assessment & Plan Note (Signed)
Patient states that she has had recurrent urinary tract infections.  This is mostly from an outside facility.  I do not see there is here.  Patient feels like she should have a referral to urogynecology.  Patient's request we will put the same but do not know if it is necessary at the time.

## 2018-05-05 NOTE — Assessment & Plan Note (Signed)
Discussed the possibility of repeating Doppler which patient declined.

## 2018-05-05 NOTE — Assessment & Plan Note (Signed)
Nondisplaced first metatarsal fracture.  Should heal encourage patient to use a rigid sole shoe.  Patient's likely peripheral neuropathy is probably contributing to the slow healing.  Continue to monitor monthly

## 2018-05-05 NOTE — Assessment & Plan Note (Signed)
Secondary to adjacent segment disease.  Discussed icing regimen and home exercises.  Discussed that at this point with her having improvement in the back pain but not the leg pain likely more of a peripheral neuropathy contributing at the moment.  Discussed with the leg pain about ruling out another DVT which patient declined.  Does not feel like it is the same as what she has had previously.  Patient has not had any shortness of breath either.  Patient feels like she did need more B12 injections and restarted them today.  Do not think they would be harmful patient has done.  Most recent B12 Lamisil Oral unremarkable.

## 2018-05-09 ENCOUNTER — Encounter: Payer: Self-pay | Admitting: Internal Medicine

## 2018-05-09 ENCOUNTER — Ambulatory Visit: Payer: Self-pay

## 2018-05-09 ENCOUNTER — Ambulatory Visit (INDEPENDENT_AMBULATORY_CARE_PROVIDER_SITE_OTHER): Payer: Medicare HMO

## 2018-05-09 ENCOUNTER — Ambulatory Visit (INDEPENDENT_AMBULATORY_CARE_PROVIDER_SITE_OTHER): Payer: Medicare HMO | Admitting: Internal Medicine

## 2018-05-09 VITALS — BP 138/78 | HR 59 | Temp 98.8°F | Wt 249.8 lb

## 2018-05-09 DIAGNOSIS — R059 Cough, unspecified: Secondary | ICD-10-CM

## 2018-05-09 DIAGNOSIS — J209 Acute bronchitis, unspecified: Secondary | ICD-10-CM

## 2018-05-09 DIAGNOSIS — R05 Cough: Secondary | ICD-10-CM | POA: Diagnosis not present

## 2018-05-09 DIAGNOSIS — R0789 Other chest pain: Secondary | ICD-10-CM

## 2018-05-09 MED ORDER — ALBUTEROL SULFATE HFA 108 (90 BASE) MCG/ACT IN AERS: 1.0000 | INHALATION_SPRAY | Freq: Four times a day (QID) | RESPIRATORY_TRACT | 0 refills | Status: DC | PRN

## 2018-05-09 MED ORDER — IPRATROPIUM-ALBUTEROL 0.5-2.5 (3) MG/3ML IN SOLN
3.0000 mL | Freq: Once | RESPIRATORY_TRACT | Status: AC
  Administered 2018-05-09: 3 mL via RESPIRATORY_TRACT

## 2018-05-09 NOTE — Progress Notes (Signed)
Chief Complaint  Patient presents with  . Cough    Cough x 4 days. SOB and some center chest pain from coughing. Taking OTC medication Mucinex DM. Using expired Albuterol HFA. Cough is productive with green phlegm. She states that sometimes her breathing "whistles". She says she is mildly SOB with exertion.    HPI: [name] 63 y.o. come in for sda     POCP is at [facility]  See above   Soreness in chest and cough more problematic  [name]   And now  Colored.  Initially thought was getting beter . And  Taking robitussin and  mucinx dm and inhjaler is expired.    Hx of  Wheezing with  Some chest colds .  Just feels bad no fever flling bleeding vomiting   ROS: See pertinent positives and negatives per HPI.  Past Medical History:  Diagnosis Date  . Anemia    takes Ferrous Sulfate daily  . Anxiety    takes Citalopram daily  . Arthritis   . Asthma    2004-prior to gastric bypass and no problems since  . CHF (congestive heart failure) (HCC)    takes Lasix daily as needed  . Chronic back pain    spondylolisthesis/stenosis/radiculopathy  . Complication of anesthesia yrs ago   slow to wake up  . Depression   . Diabetes ([place])   . DVT (deep venous thrombosis) ([place]) 01-17-13   past hx. -tx.5-6 yrs ago bilateral legs, occ. sporadic swelling, has IVC filter implanted  . Dysrhythmia    "heart tends to flutter"  . Fibromyalgia   . Fracture    right foot and is in a cam boot  . Gallstones   . GERD (gastroesophageal reflux disease)    hx of-no meds now  . Heart murmur   . History of bronchitis 2012 or 2013  . History of colon polyps   . Hypertension    takes Metoprolol daily  . Insomnia    takes Melatonin daily  . Joint pain   . Joint swelling   . Pelvic floor dysfunction   . Peripheral neuropathy   . Pneumonia 90's   hx of  . S/P gastric bypass   . Sleep apnea    no cpap used in many yrs after weight lost-no machine now  . Urinary frequency   . Urinary urgency   .  Vitamin D deficiency     Family History  Problem Relation Age of Onset  . Diabetes Mother   . Heart disease Father   . Hypertension Father   . Prostate cancer Father   . Kidney disease Brother   . Colon cancer Neg Hx   . Colon polyps Neg Hx   . Stomach cancer Neg Hx   . Esophageal cancer Neg Hx   . Pancreatic cancer Neg Hx   . Liver disease Neg Hx     Social History   Socioeconomic History  . Marital status: Widowed    Spouse name: Not on file  . Number of children: 0  . Years of education: college  . Highest education level: Not on file  Occupational History  . Occupation: retired  Scientific laboratory technician  . Financial resource strain: Not on file  . Food insecurity:    Worry: Not on file    Inability: Not on file  . Transportation needs:    Medical: Not on file    Non-medical: Not on file  Tobacco Use  . Smoking status: Former Smoker  Packs/day: 0.50    Years: 10.00    Pack years: 5.00    Types: Cigarettes    Last attempt to quit: 04/19/2009    Years since quitting: 9.0  . Smokeless tobacco: Never Used  Substance and Sexual Activity  . Alcohol use: Yes    Alcohol/week: 0.0 standard drinks    Comment: occ. social- wine-1 drink monthly  . Drug use: No  . Sexual activity: Not Currently  Lifestyle  . Physical activity:    Days per week: Not on file    Minutes per session: Not on file  . Stress: Not on file  Relationships  . Social connections:    Talks on phone: Not on file    Gets together: Not on file    Attends religious service: Not on file    Active member of club or organization: Not on file    Attends meetings of clubs or organizations: Not on file    Relationship status: Not on file  Other Topics Concern  . Not on file  Social History Narrative  . Not on file    Outpatient Medications Prior to Visit  Medication Sig Dispense Refill  . ACCU-CHEK SOFTCLIX LANCETS lancets CHECK BLOOD SUGARS TWICE DAILY 200 each 2  . Alcohol Swabs (B-D SINGLE USE SWABS  REGULAR) PADS USE TO WIPE AREA TO CHECK BLOOD SUGAR DAILY  100 each 2  . BLACK COHOSH PO Take 1 tablet by mouth daily.    . Blood Glucose Calibration (ACCU-CHEK AVIVA) SOLN Use as directed 3 each 3  . Blood Glucose Monitoring Suppl (ACCU-CHEK AVIVA PLUS) w/Device KIT Use as directed to check blood sugars daily 1 kit 0  . buPROPion (WELLBUTRIN SR) 150 MG 12 hr tablet TAKE 1 TABLET(150 MG) BY MOUTH TWICE DAILY 60 tablet 3  . diclofenac (VOLTAREN) 75 MG EC tablet Take 1 tablet (75 mg total) by mouth 2 (two) times daily. 14 tablet 0  . furosemide (LASIX) 40 MG tablet TAKE 1 TABLET DAILY AS NEEDED FOR EDEMA 90 tablet 3  . gabapentin (NEURONTIN) 400 MG capsule TAKE 1 CAPSULE FOUR TIMES DAILY 360 capsule 3  . glucose blood (ACCU-CHEK AVIVA PLUS) test strip 1 each by Other route 2 (two) times daily. Use to check blood sugars twice a day 200 each 2  . Multiple Vitamins-Minerals (MULTIVITAMINS THER. W/MINERALS) TABS Take 1 tablet by mouth daily.     . traMADol (ULTRAM) 50 MG tablet TAKE 1 TABLET(50 MG) BY MOUTH FOUR TIMES DAILY 120 tablet 2  . nitrofurantoin, macrocrystal-monohydrate, (MACROBID) 100 MG capsule Take 1 capsule (100 mg total) by mouth 2 (two) times daily. (Patient not taking: Reported on 05/09/2018) 14 capsule 0   No facility-administered medications prior to visit.      EXAM:  BP 138/78 (BP Location: Left Arm, Patient Position: Sitting, Cuff Size: Large)   Pulse (!) 59   Temp 98.8 F (37.1 C) (Oral)   Wt 249 lb 12.8 oz (113.3 kg)   SpO2 97%   BMI 40.32 kg/m   Body mass index is 40.32 kg/m.  GENERAL: vitals reviewed and listed above, alert, oriented, appears well hydrated and in no acute distress cough  Non toxic   But tired and doesn't  feel well  HEENT: atraumatic, conjunctiva  clear, no obvious abnormalities on inspection of external nose and ears  tm clear  OP : no lesion edema or exudate  NECK: no obvious masses on inspection palpation  LUNGS: clear to auscultation  bilaterally, no wheezes,  rales or rhonchi,? Dec air movement   After nebulizer  Noted [place] air movement  But she  feels bad  Mid chest  Sx  Sharp  CV: HRRR, no clubbing cyanosis or  Sig  peripheral edema nl cap refill  MS: moves all extremities without noticeable focal  Abnormality walks with assistance  PSYCH: pleasant and cooperative  Co of tiredness  In wc  Looks mildy depressed  But  Nl cognition   Lab Results  Component Value Date   WBC 5.8 03/22/2018   HGB 11.8 (L) 03/22/2018   HCT 36.8 03/22/2018   PLT 227.0 03/22/2018   GLUCOSE 82 09/15/2017   CHOL 149 12/11/2015   TRIG 62.0 12/11/2015   HDL 76.80 12/11/2015   LDLCALC 60 12/11/2015   ALT 17 09/15/2017   AST 17 09/15/2017   NA 142 09/15/2017   K 3.6 09/15/2017   CL 115 (H) 09/15/2017   CREATININE 0.70 09/15/2017   BUN 12 09/15/2017   CO2 20 09/15/2017   TSH 0.83 09/15/2017   INR 1.16 05/22/2013   HGBA1C 5.2 12/11/2015   BP Readings from Last 3 Encounters:  05/09/18 138/78  05/04/18 (!) 146/92  04/14/18 (!) 199/99    ASSESSMENT AND PLAN:  Discussed the following assessment and plan:  Cough - Plan: ipratropium-albuterol (DUONEB) 0.5-2.5 (3) MG/3ML nebulizer solution 3 mL, DG Chest 2 View  Chest tightness - prob wheezing bronchospasm  - Plan: ipratropium-albuterol (DUONEB) 0.5-2.5 (3) MG/3ML nebulizer solution 3 mL, DG Chest 2 View  Acute bronchitis, unspecified organism - Plan: albuterol (PROVENTIL HFA;VENTOLIN HFA) 108 (90 Base) MCG/ACT inhaler  Morbid obesity ([place]) Exam improved with duoneb but says doesn't   feel well so c xray ordered.  X ray si stable no pna  Or chf.    Reassuring   rx for viral dc dm incase causing  fatigue . Fu with pcp  Refill inhaler    -Patient advised to return or notify health care team  if  new concerns arise. Total visit 57mns > 50% spent counseling and coordinating care as indicated in above note and in instructions to patient .   Patient Instructions  This acts like a wheezy  bronchitis  Type of illness usually viral chest infection   chest x ray shows no  Pneumonia or acitve heart failure  Will refill the albuterol inhaler  And  Rest and fluids  Try plain mucinex .  To  Avoid   Cold medicine side effects   Hot tea and honey  To soothe the airway .   Suggest Follow up with you regular doctors office if  persistent or progressive  Not better   After weekend or worse  Stay hydrated !   Seek ed care if  Serious pain and shortness of breath        WMariann LasterK. Panosh M.D.

## 2018-05-09 NOTE — Telephone Encounter (Signed)
Pt being seen today in office Nothing further needed.

## 2018-05-09 NOTE — Patient Instructions (Addendum)
This acts like a wheezy bronchitis  Type of illness usually viral chest infection   chest x ray shows no  Pneumonia or acitve heart failure  Will refill the albuterol inhaler  And  Rest and fluids  Try plain mucinex .  To  Avoid   Cold medicine side effects   Hot tea and honey  To soothe the airway .   Suggest Follow up with you regular doctors office if  persistent or progressive  Not better   After weekend or worse  Stay hydrated !   Seek ed care if  Serious pain and shortness of breath

## 2018-05-09 NOTE — Telephone Encounter (Signed)
Pt called to say she has had a cough and cold that she has been taking OTC medication Mucinex DM.  She states she has cough so much her chest is painful right side under her breast.  She said today she coughed up green phlegm. She states that she has CHF and asthma.  She states that sometimes her breathing whistles. She says she is mildly SOB with exertion. Appointment scheduled per protocol with the [facility] office. No available appointments with [facility].  Care advice read to patient. Pt verbalized understanding of all instructions. Reason for Disposition . Wheezing is present  Answer Assessment - Initial Assessment Questions 1. ONSET: "When did the cough begin?"      Last week 2. SEVERITY: "How bad is the cough today?"      severe 3. RESPIRATORY DISTRESS: "Describe your breathing."      Sob just a little but it hurts on right side under breast 4. FEVER: "Do you have a fever?" If so, ask: "What is your temperature, how was it measured, and when did it start?"     no 5. SPUTUM: "Describe the color of your sputum" (clear, white, yellow, green)     green 6. HEMOPTYSIS: "Are you coughing up any blood?" If so ask: "How much?" (flecks, streaks, tablespoons, etc.)    no 7. CARDIAC HISTORY: "Do you have any history of heart disease?" (e.g., heart attack, congestive heart failure)      CHF 8. LUNG HISTORY: "Do you have any history of lung disease?"  (e.g., pulmonary embolus, asthma, emphysema)     asthma 9. PE RISK FACTORS: "Do you have a history of blood clots?" (or: recent major surgery, recent prolonged travel, bedridden)    no 10. OTHER SYMPTOMS: "Do you have any other symptoms?" (e.g., runny nose, wheezing, chest pain)       Runny nose,a lot of drainage, chest pain with cough to right side under breast pain does not radiate 11. PREGNANCY: "Is there any chance you are pregnant?" "When was your last menstrual period?"       N/A 12. TRAVEL: "Have you traveled out of the country in the last  month?" (e.g., travel history, exposures)       no  Protocols used: [place]

## 2018-05-10 ENCOUNTER — Encounter: Payer: Self-pay | Admitting: Family Medicine

## 2018-05-11 ENCOUNTER — Other Ambulatory Visit: Payer: Self-pay | Admitting: Internal Medicine

## 2018-05-17 ENCOUNTER — Other Ambulatory Visit: Payer: Self-pay | Admitting: Internal Medicine

## 2018-05-17 DIAGNOSIS — Z1231 Encounter for screening mammogram for malignant neoplasm of breast: Secondary | ICD-10-CM | POA: Diagnosis not present

## 2018-05-17 LAB — HM MAMMOGRAPHY

## 2018-05-17 MED ORDER — GABAPENTIN 400 MG PO CAPS: ORAL_CAPSULE | ORAL | 0 refills | Status: DC

## 2018-05-17 NOTE — Telephone Encounter (Signed)
To bridge while mail order arrives.

## 2018-05-17 NOTE — Telephone Encounter (Signed)
Spoke with patient.  She states she is completely out of her gabapentin. Needs 30 days sent to local pharmacy until shipped for mail order.

## 2018-05-17 NOTE — Telephone Encounter (Signed)
Copied from [place]. Topic: Quick Communication - Rx Refill/Question >> May 17, 2018  2:10 PM Keene Breath wrote: Medication: gabapentin (NEURONTIN) 400 MG capsule, short spill  Patient called to request a refill for the above medication  Preferred Pharmacy (with phone number or street name): [facility], [address] AT [address] [phone] (Phone) [phone] (Fax)

## 2018-05-18 ENCOUNTER — Other Ambulatory Visit: Payer: Self-pay | Admitting: Internal Medicine

## 2018-05-18 NOTE — Telephone Encounter (Signed)
Called pharamcy last refill 04/21/2018 120 tablets   LOV: 01/17/2018 NOV: 05/19/2018

## 2018-05-18 NOTE — Telephone Encounter (Signed)
Can wait until visit to refill

## 2018-05-19 ENCOUNTER — Other Ambulatory Visit: Payer: Medicare HMO

## 2018-05-19 ENCOUNTER — Ambulatory Visit (INDEPENDENT_AMBULATORY_CARE_PROVIDER_SITE_OTHER): Payer: Medicare HMO | Admitting: Internal Medicine

## 2018-05-19 ENCOUNTER — Encounter: Payer: Self-pay | Admitting: Internal Medicine

## 2018-05-19 VITALS — BP 150/90 | HR 65 | Temp 98.7°F | Ht 66.0 in | Wt 255.0 lb

## 2018-05-19 DIAGNOSIS — E538 Deficiency of other specified B group vitamins: Secondary | ICD-10-CM | POA: Diagnosis not present

## 2018-05-19 DIAGNOSIS — F5101 Primary insomnia: Secondary | ICD-10-CM | POA: Diagnosis not present

## 2018-05-19 DIAGNOSIS — F331 Major depressive disorder, recurrent, moderate: Secondary | ICD-10-CM | POA: Diagnosis not present

## 2018-05-19 DIAGNOSIS — R531 Weakness: Secondary | ICD-10-CM

## 2018-05-19 DIAGNOSIS — G4700 Insomnia, unspecified: Secondary | ICD-10-CM | POA: Insufficient documentation

## 2018-05-19 MED ORDER — CYANOCOBALAMIN 1000 MCG/ML IJ SOLN
1000.0000 ug | Freq: Once | INTRAMUSCULAR | Status: AC
  Administered 2018-05-19: 1000 ug via INTRAMUSCULAR

## 2018-05-19 MED ORDER — TRAMADOL HCL 50 MG PO TABS: ORAL_TABLET | ORAL | 5 refills | Status: DC

## 2018-05-19 MED ORDER — DULOXETINE HCL 30 MG PO CPEP: 30.0000 mg | ORAL_CAPSULE | Freq: Every day | ORAL | 3 refills | Status: DC

## 2018-05-19 NOTE — Assessment & Plan Note (Addendum)
The patient has moderate depression which is recurrent. There are not psychotic features. Prior therapy tried wellbutrin and effexor. Current therapy taper wellbutrin and add cymbalta and increase that which is adjusted today. Return in 1-2 months.

## 2018-05-19 NOTE — Progress Notes (Signed)
   Subjective:   Patient ID: [name], female    DOB: Aug 25, 1955, 63 y.o.   MRN: [id number]  HPI The patient is a 63 YO female coming in for several concerns including depression (due to lack of mobility and function she is getting depressed, using walker at home now, feels inadequate as she is not able to care for her mother well which she sees as her primary purpose right now, have some respite care to help as well as paid help for some housework, taking wellbutrin and this is not helping lately, denies SI/HI) sleep problems (since she has been depressed and hurting more and functioning less she is sleeping poorly, hard to get comfortable for sleep, waking up some, not waking rested, <4 hours of sleep per night, denies trying anything for this, overall stable since onset several months ago) and weakness (started after foot fracture, some workup with sports medicine nonspecific, gets more weakness with more movement, prior evaluation for MS with MRI brain no findings, happens in her legs and arms, they get more weak the more she does, some alleviation of pain with advil, her tramadol used to help with pain but is not helping lately).   Review of Systems  Constitutional: Positive for activity change, appetite change and fatigue.  HENT: Negative.   Eyes: Negative.   Respiratory: Negative for cough, chest tightness and shortness of breath.   Cardiovascular: Negative for chest pain, palpitations and leg swelling.  Gastrointestinal: Negative for abdominal distention, abdominal pain, constipation, diarrhea, nausea and vomiting.  Musculoskeletal: Positive for arthralgias, gait problem and myalgias.  Skin: Negative.   Neurological: Positive for weakness.  Psychiatric/Behavioral: Positive for decreased concentration, dysphoric mood and sleep disturbance. Negative for self-injury and suicidal ideas. The patient is nervous/anxious.     Objective:  Physical Exam Constitutional:      Appearance:  She is well-developed. She is obese.  HENT:     Head: Normocephalic and atraumatic.  Neck:     Musculoskeletal: Normal range of motion.  Cardiovascular:     Rate and Rhythm: Normal rate and regular rhythm.  Pulmonary:     Effort: Pulmonary effort is normal. No respiratory distress.     Breath sounds: Normal breath sounds. No wheezing or rales.  Abdominal:     General: Bowel sounds are normal. There is no distension.     Palpations: Abdomen is soft.     Tenderness: There is no abdominal tenderness. There is no rebound.     Comments: Worsening abdominal adiposity  Skin:    General: Skin is warm and dry.  Neurological:     Mental Status: She is alert and oriented to person, place, and time.     Coordination: Coordination abnormal.     Comments: With walker  Psychiatric:     Comments: Appears upset and tearing during visit appropriate to concerns     Vitals:   05/19/18 1526  BP: (!) 150/90  Pulse: 65  Temp: 98.7 F (37.1 C)  TempSrc: Oral  SpO2: 97%  Weight: 255 lb (115.7 kg)  Height: 5\' 6"  (1.676 m)    Assessment & Plan:  B12 injection given at visit

## 2018-05-19 NOTE — Assessment & Plan Note (Signed)
Checking MG workup and ANA to rule out auto-immune disease. She is awaiting PT evaluation as well to help.

## 2018-05-19 NOTE — Patient Instructions (Addendum)
We have sent in the refill of the tramadol for you.  We will have you cut the wellbutrin down to 1 pill daily for 2 weeks then stop.  We have sent in cymbalta to help more. Start with 1 pill daily for the first week, then increase to 2 pills daily and stay at that does. Let us know in 3-4 weeks how you are feeling. It can take a month for it to fully take effect.

## 2018-05-19 NOTE — Assessment & Plan Note (Signed)
Likely due to pain and depression. Treating depression and continuing to evaluate pain and treat with PT. After control of depression if still not sleeping well can address separately.

## 2018-05-22 ENCOUNTER — Encounter: Payer: Self-pay | Admitting: Internal Medicine

## 2018-05-22 NOTE — Progress Notes (Signed)
Abstracted and sent to scan  

## 2018-05-24 LAB — ACETYLCHOLINE RECEPTOR, BINDING: A CHR BINDING ABS: <0.3 nmol/L

## 2018-05-24 LAB — ANA, IFA COMPREHENSIVE PANEL
Anti Nuclear Antibody(ANA): NEGATIVE
ENA SM Ab Ser-aCnc: <1 AI
SM/RNP: <1 AI
SSA (Ro) (ENA) Antibody, IgG: <1 AI
SSB (La) (ENA) Antibody, IgG: <1 AI
Scleroderma (Scl-70) (ENA) Antibody, IgG: <1 AI
ds DNA Ab: <1 IU/mL

## 2018-05-24 LAB — STRIATED MUSCLE ANTIBODY: STRIATED MUSCLE AB SCREEN: NEGATIVE

## 2018-06-07 DIAGNOSIS — R296 Repeated falls: Secondary | ICD-10-CM | POA: Diagnosis not present

## 2018-06-07 DIAGNOSIS — R2689 Other abnormalities of gait and mobility: Secondary | ICD-10-CM | POA: Diagnosis not present

## 2018-06-07 DIAGNOSIS — M545 Low back pain: Secondary | ICD-10-CM | POA: Diagnosis not present

## 2018-06-07 DIAGNOSIS — R2681 Unsteadiness on feet: Secondary | ICD-10-CM | POA: Diagnosis not present

## 2018-06-15 ENCOUNTER — Emergency Department (HOSPITAL_COMMUNITY): Payer: Medicare HMO

## 2018-06-15 ENCOUNTER — Emergency Department (HOSPITAL_COMMUNITY)
Admission: EM | Admit: 2018-06-15 | Discharge: 2018-06-15 | Disposition: A | Discharge status: Home / Self Care | Payer: Medicare HMO | Attending: Emergency Medicine | Admitting: Emergency Medicine

## 2018-06-15 ENCOUNTER — Ambulatory Visit (INDEPENDENT_AMBULATORY_CARE_PROVIDER_SITE_OTHER): Payer: Medicare HMO | Admitting: Family Medicine

## 2018-06-15 ENCOUNTER — Other Ambulatory Visit: Payer: Self-pay

## 2018-06-15 ENCOUNTER — Encounter: Payer: Self-pay | Admitting: Family Medicine

## 2018-06-15 ENCOUNTER — Encounter (HOSPITAL_COMMUNITY): Payer: Self-pay | Admitting: Emergency Medicine

## 2018-06-15 DIAGNOSIS — E119 Type 2 diabetes mellitus without complications: Secondary | ICD-10-CM | POA: Diagnosis not present

## 2018-06-15 DIAGNOSIS — I10 Essential (primary) hypertension: Secondary | ICD-10-CM | POA: Diagnosis not present

## 2018-06-15 DIAGNOSIS — F329 Major depressive disorder, single episode, unspecified: Secondary | ICD-10-CM | POA: Diagnosis not present

## 2018-06-15 DIAGNOSIS — R4781 Slurred speech: Secondary | ICD-10-CM | POA: Diagnosis not present

## 2018-06-15 DIAGNOSIS — R531 Weakness: Secondary | ICD-10-CM

## 2018-06-15 DIAGNOSIS — I509 Heart failure, unspecified: Secondary | ICD-10-CM | POA: Diagnosis not present

## 2018-06-15 DIAGNOSIS — R0600 Dyspnea, unspecified: Secondary | ICD-10-CM | POA: Diagnosis not present

## 2018-06-15 DIAGNOSIS — I110 Hypertensive heart disease with heart failure: Secondary | ICD-10-CM | POA: Diagnosis not present

## 2018-06-15 DIAGNOSIS — Z9884 Bariatric surgery status: Secondary | ICD-10-CM | POA: Diagnosis not present

## 2018-06-15 DIAGNOSIS — F419 Anxiety disorder, unspecified: Secondary | ICD-10-CM | POA: Diagnosis not present

## 2018-06-15 DIAGNOSIS — J45909 Unspecified asthma, uncomplicated: Secondary | ICD-10-CM | POA: Diagnosis not present

## 2018-06-15 DIAGNOSIS — Z79899 Other long term (current) drug therapy: Secondary | ICD-10-CM | POA: Diagnosis not present

## 2018-06-15 DIAGNOSIS — Z9049 Acquired absence of other specified parts of digestive tract: Secondary | ICD-10-CM | POA: Insufficient documentation

## 2018-06-15 DIAGNOSIS — Z87891 Personal history of nicotine dependence: Secondary | ICD-10-CM | POA: Insufficient documentation

## 2018-06-15 DIAGNOSIS — R4701 Aphasia: Secondary | ICD-10-CM | POA: Diagnosis present

## 2018-06-15 DIAGNOSIS — R52 Pain, unspecified: Secondary | ICD-10-CM | POA: Diagnosis not present

## 2018-06-15 DIAGNOSIS — R51 Headache: Secondary | ICD-10-CM | POA: Diagnosis not present

## 2018-06-15 LAB — COMPREHENSIVE METABOLIC PANEL
ALBUMIN: 3.4 g/dL — AB (ref 3.5–5.0)
ALT: 32 U/L (ref 0–44)
AST: 29 U/L (ref 15–41)
Alkaline Phosphatase: 198 U/L — ABNORMAL HIGH (ref 38–126)
Anion gap: 5 (ref 5–15)
BUN: 12 mg/dL (ref 8–23)
CHLORIDE: 116 mmol/L — AB (ref 98–111)
CO2: 19 mmol/L — ABNORMAL LOW (ref 22–32)
Calcium: 7.9 mg/dL — ABNORMAL LOW (ref 8.9–10.3)
Creatinine, Ser: 0.78 mg/dL (ref 0.44–1.00)
GFR calc Af Amer: >60 mL/min (ref >60)
GFR calc non Af Amer: >60 mL/min (ref >60)
Glucose, Bld: 103 mg/dL — ABNORMAL HIGH (ref 70–99)
Potassium: 3.8 mmol/L (ref 3.5–5.1)
Sodium: 140 mmol/L (ref 135–145)
Total Bilirubin: 0.3 mg/dL (ref 0.3–1.2)
Total Protein: 6 g/dL — ABNORMAL LOW (ref 6.5–8.1)

## 2018-06-15 LAB — URINALYSIS, ROUTINE W REFLEX MICROSCOPIC
Bilirubin Urine: NEGATIVE
Glucose, UA: NEGATIVE mg/dL
Hgb urine dipstick: NEGATIVE
Ketones, ur: NEGATIVE mg/dL
Nitrite: NEGATIVE
Protein, ur: NEGATIVE mg/dL
Specific Gravity, Urine: 1.013 (ref 1.005–1.030)
pH: 6 (ref 5.0–8.0)

## 2018-06-15 LAB — CBC WITH DIFFERENTIAL/PLATELET
ABS IMMATURE GRANULOCYTES: 0.01 10*3/uL (ref 0.00–0.07)
Basophils Absolute: 0 10*3/uL (ref 0.0–0.1)
Basophils Relative: 0 %
Eosinophils Absolute: 0.1 10*3/uL (ref 0.0–0.5)
Eosinophils Relative: 2 %
HCT: 36.8 % (ref 36.0–46.0)
Hemoglobin: 11.1 g/dL — ABNORMAL LOW (ref 12.0–15.0)
IMMATURE GRANULOCYTES: 0 %
LYMPHS ABS: 2 10*3/uL (ref 0.7–4.0)
Lymphocytes Relative: 29 %
MCH: 26.6 pg (ref 26.0–34.0)
MCHC: 30.2 g/dL (ref 30.0–36.0)
MCV: 88 fL (ref 80.0–100.0)
Monocytes Absolute: 0.5 10*3/uL (ref 0.1–1.0)
Monocytes Relative: 7 %
NEUTROS PCT: 62 %
Neutro Abs: 4.5 10*3/uL (ref 1.7–7.7)
Platelets: 212 10*3/uL (ref 150–400)
RBC: 4.18 MIL/uL (ref 3.87–5.11)
RDW: 17.4 % — ABNORMAL HIGH (ref 11.5–15.5)
WBC: 7.1 10*3/uL (ref 4.0–10.5)
nRBC: 0 % (ref 0.0–0.2)

## 2018-06-15 LAB — ETHANOL: Alcohol, Ethyl (B): <10 mg/dL (ref <10)

## 2018-06-15 LAB — CBG MONITORING, ED: Glucose-Capillary: 81 mg/dL (ref 70–99)

## 2018-06-15 LAB — PROTIME-INR
INR: 1.1 (ref 0.8–1.2)
PROTHROMBIN TIME: 14.2 s (ref 11.4–15.2)

## 2018-06-15 MED ORDER — ACETAMINOPHEN 500 MG PO TABS
1000.0000 mg | ORAL_TABLET | Freq: Once | ORAL | Status: AC
  Administered 2018-06-15: 1000 mg via ORAL
  Filled 2018-06-15: qty 2

## 2018-06-15 NOTE — ED Notes (Signed)
Pt remains at imaging 

## 2018-06-15 NOTE — ED Provider Notes (Signed)
[facility] Provider Note   CSN: [id number] Arrival date & time: 06/15/18  1625    History   Chief Complaint Chief Complaint  Patient presents with  . Aphasia    HPI [name] is a 63 y.o. female.     63 year old female with prior medical history as detailed below presents for evaluation of difficulty speaking.  Patient reports onset of symptoms yesterday evening at 6 PM.  Patient symptoms have been continuous since.  She reports difficulty finding the right words.  She reports that her speech is slow.  She was sent here today from her sports medicine doctor's office where she was being evaluated for an unrelated problem.  Upon arrival to the ED she speaks slowly and appears to have some difficulty finding the right words.  She is fully aware of her symptoms.  She denies associated numbness, weakness, vision change, or focal deficit.  Symptoms have been ongoing continuously for approximately 23 hours.  The history is provided by the patient and medical records.  Illness  Location:  Speech abnormality Severity:  Mild Onset quality:  Sudden Duration:  1 day Timing:  Constant Progression:  Unchanged Chronicity:  New   Past Medical History:  Diagnosis Date  . Anemia    takes Ferrous Sulfate daily  . Anxiety    takes Citalopram daily  . Arthritis   . Asthma    2004-prior to gastric bypass and no problems since  . CHF (congestive heart failure) (HCC)    takes Lasix daily as needed  . Chronic back pain    spondylolisthesis/stenosis/radiculopathy  . Complication of anesthesia yrs ago   slow to wake up  . Depression   . Diabetes ([place])   . DVT (deep venous thrombosis) ([place]) 01-17-13   past hx. -tx.5-6 yrs ago bilateral legs, occ. sporadic swelling, has IVC filter implanted  . Dysrhythmia    "heart tends to flutter"  . Fibromyalgia   . Fracture    right foot and is in a cam boot  . Gallstones   . GERD (gastroesophageal  reflux disease)    hx of-no meds now  . Heart murmur   . History of bronchitis 2012 or 2013  . History of colon polyps   . Hypertension    takes Metoprolol daily  . Insomnia    takes Melatonin daily  . Joint pain   . Joint swelling   . Pelvic floor dysfunction   . Peripheral neuropathy   . Pneumonia 90's   hx of  . S/P gastric bypass   . Sleep apnea    no cpap used in many yrs after weight lost-no machine now  . Urinary frequency   . Urinary urgency   . Vitamin D deficiency     Patient Active Problem List   Diagnosis Date Noted  . Insomnia 05/19/2018  . Frequent refractory urinary tract infections 05/05/2018  . Toe pain, left 01/20/2018  . Right leg pain 12/16/2017  . History of deep vein thrombosis (DVT) of lower extremity 12/12/2017  . Cavovarus deformity of foot, acquired, unspecified laterality 04/07/2017  . S/P gastric bypass 08/18/2016  . Muscle cramps 07/09/2016  . Adjustment disorder with mixed anxiety and depressed mood 03/05/2016  . Weakness 03/05/2016  . Cervical disc disorder with radiculopathy of cervical region 02/27/2016  . Degenerative arthritis of right knee 02/27/2016  . Right knee pain 02/24/2016  . Left shoulder pain 02/24/2016  . Routine general medical examination at a health  care facility 12/12/2015  . Schatzki's ring   . Lumbar radiculopathy 03/05/2015  . Peripheral neuropathy 01/17/2015  . Fibromyalgia 02/21/2011  . Essential hypertension 12/04/2006  . Hx of diabetes mellitus 10/20/2006  . Morbid obesity ([place]) 10/20/2006  . MDD (major depressive disorder) ([place]) 10/20/2006  . OBSTRUCTIVE SLEEP APNEA 10/20/2006  . OSTEOARTHRITIS 10/20/2006  . DVT, HX OF 10/20/2006    Past Surgical History:  Procedure Laterality Date  . BALLOON DILATION N/A 01/31/2013   Procedure: BALLOON DILATION;  Surgeon: Arta Silence, MD;  Location: [facility];  Service: Endoscopy;  Laterality: N/A;  . CHOLECYSTECTOMY  2007  . COLONOSCOPY    . DILATION AND  CURETTAGE OF UTERUS  yrs ago  . ESOPHAGOGASTRODUODENOSCOPY (EGD) WITH PROPOFOL  03/01/2012   Procedure: ESOPHAGOGASTRODUODENOSCOPY (EGD) WITH PROPOFOL;  Surgeon: Arta Silence, MD;  Location: [facility];  Service: Endoscopy;  Laterality: N/A;  . ESOPHAGOGASTRODUODENOSCOPY (EGD) WITH PROPOFOL N/A 01/31/2013   Procedure: ESOPHAGOGASTRODUODENOSCOPY (EGD) WITH PROPOFOL;  Surgeon: Arta Silence, MD;  [facility]: [facility];  Service: Endoscopy;  Laterality: N/A;  . ESOPHAGOGASTRODUODENOSCOPY (EGD) WITH PROPOFOL N/A 09/02/2015   Procedure: ESOPHAGOGASTRODUODENOSCOPY (EGD) WITH PROPOFOL;  Surgeon: Gatha Mayer, MD;  Location: [facility];  Service: Endoscopy;  Laterality: N/A;  . GASTRIC BY-PASS  2004  . HERNIA REPAIR  2005  . INSERTION OF VENA CAVA FILTER  01-17-13   inserted 2004- "abdomen"  . LIGAMENT REPAIR Right 1987   Rt. knee scope  . MAXIMUM ACCESS (MAS)POSTERIOR LUMBAR INTERBODY FUSION (PLIF) 2 LEVEL N/A 06/07/2014   Procedure: L4-5 L5-S1 FOR MAXIMUM ACCESS (MAS) POSTERIOR LUMBAR INTERBODY FUSION ;  Surgeon: Erline Levine, MD;  Location: [facility];  Service: Neurosurgery;  Laterality: N/A;  L4-5 L5-S1 FOR MAXIMUM ACCESS (MAS) POSTERIOR LUMBAR INTERBODY FUSION   . TONSILLECTOMY     as child     OB History   No obstetric history on file.      Home Medications    Prior to Admission medications   Medication Sig Start Date End Date Taking? Authorizing Provider  ACCU-CHEK SOFTCLIX LANCETS lancets CHECK BLOOD SUGARS TWICE DAILY 01/26/17   Hoyt Koch, MD  albuterol (PROVENTIL HFA;VENTOLIN HFA) 108 (90 Base) MCG/ACT inhaler Inhale 1-2 puffs into the lungs every 6 (six) hours as needed for wheezing or shortness of breath. 05/09/18   Panosh, Standley Brooking, MD  Alcohol Swabs (B-D SINGLE USE SWABS REGULAR) PADS USE TO WIPE AREA TO CHECK BLOOD SUGAR DAILY  01/19/18   Hoyt Koch, MD  BLACK COHOSH PO Take 1 tablet by mouth daily.    [provider]  Blood Glucose  Calibration (ACCU-CHEK AVIVA) SOLN Use as directed 10/29/15   Hoyt Koch, MD  Blood Glucose Monitoring Suppl (ACCU-CHEK AVIVA PLUS) w/Device KIT USE AS DIRECTED  TO CHECK BLOOD SUGAR EVERY DAY 05/11/18   Hoyt Koch, MD  buPROPion [facility] SR) 150 MG 12 hr tablet TAKE 1 TABLET(150 MG) BY MOUTH TWICE DAILY 04/24/18   Hoyt Koch, MD  diclofenac (VOLTAREN) 75 MG EC tablet Take 1 tablet (75 mg total) by mouth 2 (two) times daily. 03/25/18   Jacqualine Mau, NP  DULoxetine (CYMBALTA) 30 MG capsule Take 1-2 capsules (30-60 mg total) by mouth daily. 05/19/18   Hoyt Koch, MD  furosemide (LASIX) 40 MG tablet TAKE 1 TABLET DAILY AS NEEDED FOR EDEMA 05/11/18   Hoyt Koch, MD  gabapentin (NEURONTIN) 400 MG capsule TAKE 1 CAPSULE FOUR TIMES DAILY 05/17/18   Pricilla Holm  A, MD  glucose blood (ACCU-CHEK AVIVA PLUS) test strip 1 each by Other route 2 (two) times daily. Use to check blood sugars twice a day 03/30/18   Hoyt Koch, MD  Multiple Vitamins-Minerals (MULTIVITAMINS THER. W/MINERALS) TABS Take 1 tablet by mouth daily.     [provider]  traMADol (ULTRAM) 50 MG tablet TAKE 1 TABLET(50 MG) BY MOUTH FOUR TIMES DAILY 05/19/18   Hoyt Koch, MD    Family History Family History  Problem Relation Age of Onset  . Diabetes Mother   . Heart disease Father   . Hypertension Father   . Prostate cancer Father   . Kidney disease Brother   . Colon cancer Neg Hx   . Colon polyps Neg Hx   . Stomach cancer Neg Hx   . Esophageal cancer Neg Hx   . Pancreatic cancer Neg Hx   . Liver disease Neg Hx     Social History Social History   Tobacco Use  . Smoking status: Former Smoker    Packs/day: 0.50    Years: 10.00    Pack years: 5.00    Types: Cigarettes    Last attempt to quit: 04/19/2009    Years since quitting: 9.1  . Smokeless tobacco: Never Used  Substance Use Topics  . Alcohol use: Yes    Alcohol/week: 0.0  standard drinks    Comment: occ. social- wine-1 drink monthly  . Drug use: No     Allergies   Carafate [sucralfate] and Sulfonamide derivatives   Review of Systems Review of Systems  All other systems reviewed and are negative.    Physical Exam Updated Vital Signs BP 130/71   Pulse (!) 53   Temp 98.1 F (36.7 C) (Oral)   Resp 15   SpO2 93%   Physical Exam Vitals signs and nursing note reviewed.  Constitutional:      General: She is not in acute distress.    Appearance: She is well-developed.  HENT:     Head: Normocephalic and atraumatic.  Eyes:     Conjunctiva/sclera: Conjunctivae normal.     Pupils: Pupils are equal, round, and reactive to light.  Neck:     Musculoskeletal: Normal range of motion and neck supple.  Cardiovascular:     Rate and Rhythm: Normal rate and regular rhythm.     Heart sounds: Normal heart sounds.  Pulmonary:     Effort: Pulmonary effort is normal. No respiratory distress.     Breath sounds: Normal breath sounds.  Abdominal:     General: There is no distension.     Palpations: Abdomen is soft.     Tenderness: There is no abdominal tenderness.  Musculoskeletal: Normal range of motion.        General: No deformity.  Skin:    General: Skin is warm and dry.  Neurological:     General: No focal deficit present.     Mental Status: She is alert and oriented to person, place, and time. Mental status is at baseline.     Cranial Nerves: No cranial nerve deficit.     Sensory: No sensory deficit.     Motor: No weakness.     Coordination: Coordination normal.     Comments: Patient is alert and oriented x4.  She does appear to be mildly anxious.  Speech is slow and deliberate.  Patient does intermittently use incorrect word.  No facial droop.  Lucianne Lei negative  5 out of 5 strength in both upper and  lower extremities      ED Treatments / Results  Labs (all labs ordered are listed, but only abnormal results are displayed) Labs Reviewed    COMPREHENSIVE METABOLIC PANEL - Abnormal; Notable for the following components:      Result Value   Chloride 116 (*)    CO2 19 (*)    Glucose, Bld 103 (*)    Calcium 7.9 (*)    Total Protein 6.0 (*)    Albumin 3.4 (*)    Alkaline Phosphatase 198 (*)    All other components within normal limits  CBC WITH DIFFERENTIAL/PLATELET - Abnormal; Notable for the following components:   Hemoglobin 11.1 (*)    RDW 17.4 (*)    All other components within normal limits  URINALYSIS, ROUTINE W REFLEX MICROSCOPIC - Abnormal; Notable for the following components:   APPearance HAZY (*)    Leukocytes,Ua SMALL (*)    Bacteria, UA RARE (*)    All other components within normal limits  ETHANOL  PROTIME-INR  CBG MONITORING, ED    EKG EKG Interpretation  Date/Time:  Thursday June 15 2018 16:40:16 EST Ventricular Rate:  62 PR Interval:    QRS Duration: 102 QT Interval:  451 QTC Calculation: 458 R Axis:   82 Text Interpretation:  Sinus rhythm Probable lateral infarct, old Confirmed by Dene Gentry [id number]) on 06/15/2018 4:49:22 PM   Radiology Ct Head Wo Contrast  Result Date: 06/15/2018 CLINICAL DATA:  Bilateral leg weakness. EXAM: CT HEAD WITHOUT CONTRAST TECHNIQUE: Contiguous axial images were obtained from the base of the skull through the vertex without intravenous contrast. COMPARISON:  MRI 10/26/2017 FINDINGS: Brain: No acute intracranial abnormality. Specifically, no hemorrhage, hydrocephalus, mass lesion, acute infarction, or significant intracranial injury. Vascular: No hyperdense vessel or unexpected calcification. Skull: No acute calvarial abnormality. Sinuses/Orbits: Visualized paranasal sinuses and mastoids clear. Orbital soft tissues unremarkable. Other: None IMPRESSION: No intracranial abnormality. Electronically Signed   By: Rolm Baptise M.D.   On: 06/15/2018 19:20   Mr Brain Wo Contrast (neuro Protocol)  Result Date: 06/15/2018 CLINICAL DATA:  Weakness and slurred speech EXAM:  MRI HEAD WITHOUT CONTRAST TECHNIQUE: Multiplanar, multiecho pulse sequences of the brain and surrounding structures were obtained without intravenous contrast. COMPARISON:  Brain MRI 10/26/2017 Head CT 06/15/2018 FINDINGS: BRAIN: There is no acute infarct, acute hemorrhage, hydrocephalus or extra-axial collection. The midline structures are normal. No midline shift or other mass effect. Multifocal white matter hyperintensity, most commonly due to chronic ischemic microangiopathy. Generalized atrophy without lobar predilection. Susceptibility-sensitive sequences show no chronic microhemorrhage or superficial siderosis. VASCULAR: Major intracranial arterial and venous sinus flow voids are normal. SKULL AND UPPER CERVICAL SPINE: Calvarial bone marrow signal is normal. There is no skull base mass. Visualized upper cervical spine and soft tissues are normal. SINUSES/ORBITS: No fluid levels or advanced mucosal thickening. No mastoid or middle ear effusion. The orbits are normal. IMPRESSION: Mild chronic small vessel disease and volume loss without acute intracranial abnormality Electronically Signed   By: Ulyses Jarred M.D.   On: 06/15/2018 20:14   Dg Chest Port 1 View  Result Date: 06/15/2018 CLINICAL DATA:  Dyspnea EXAM: PORTABLE CHEST 1 VIEW COMPARISON:  Chest x-rays dated 05/09/2018 and 12/19/2016. FINDINGS: Stable cardiomegaly. Chronic scarring/atelectasis at the lung bases. No new lung findings. No pleural effusion or pneumothorax seen. Osseous structures about the chest are unremarkable. IMPRESSION: No active disease. No evidence of pneumonia or pulmonary edema. Stable cardiomegaly. Electronically Signed   By: Franki Cabot M.D.   On:  06/15/2018 17:32    Procedures Procedures (including critical care time)  Medications Ordered in ED Medications  acetaminophen (TYLENOL) tablet 1,000 mg (1,000 mg Oral Given 06/15/18 2047)     Initial Impression / Assessment and Plan / ED Course  I have reviewed the  triage vital signs and the nursing notes.  Pertinent labs & imaging results that were available during my care of the patient were reviewed by me and considered in my medical decision making (see chart for details).        MDM  Screen complete  Patient's presentation initially is concerning for her reported difficulty with word finding.  Patient is otherwise neurologically intact.  Screening labs obtained in the ED are without significant abnormality.  CT head and MRI brain are without evidence of acute bleed or stroke.  After her work-up the patient does appear to be improved.  After extensive discussion with the patient it does appear that she suffers from significant stressors.  Case was discussed with our neuro hospitalist team - Dr Cheral Marker -who agrees that further work-up at this time is not necessary.  Patient was offered further evaluation by behavioral health.  She declines same.  She prefers to follow-up with your regular care provider tomorrow.  She does apparently use an outpatient counselor on a regular basis.    Importance of close follow-up is stressed.  Strict return precautions given and understood.    Final Clinical Impressions(s) / ED Diagnoses   Final diagnoses:  Slurred speech    ED Discharge Orders    None       Valarie Merino, MD 06/15/18 2209

## 2018-06-15 NOTE — ED Notes (Signed)
Pt back from MRI 

## 2018-06-15 NOTE — ED Notes (Signed)
Pt reminded of the need for urine.  

## 2018-06-15 NOTE — ED Notes (Signed)
Patient verbalizes understanding of discharge instructions. Opportunity for questioning and answers were provided. Armband removed by staff, pt discharged from ED.  

## 2018-06-15 NOTE — Progress Notes (Signed)
[name] [place], [place] [address] Phone: [phone] Subjective:    I [name] am serving as a Education administrator for Dr. Hulan Saas.  CC: Headaches and multiple aches  ION:[id number]       Updated 06/15/2018 [name] is a 63 y.o. female coming in with complaint of peripheral neuropathy. Leg pain. States that she is not doing better. Has a headache that started yesterday across the anterior skull. Blood pressure is high today.  Patient is having a headache that is worse than usual.  Patient never actually has headaches.  Condition is more muscle aches and pains as well.  Feels that the peripheral neuropathy is difficult.  Patient is difficult giving history though secondary to some slurred speech.  Patient has difficulty with word finding as well.     Past Medical History:  Diagnosis Date  . Anemia    takes Ferrous Sulfate daily  . Anxiety    takes Citalopram daily  . Arthritis   . Asthma    2004-prior to gastric bypass and no problems since  . CHF (congestive heart failure) (HCC)    takes Lasix daily as needed  . Chronic back pain    spondylolisthesis/stenosis/radiculopathy  . Complication of anesthesia yrs ago   slow to wake up  . Depression   . Diabetes ([place])   . DVT (deep venous thrombosis) ([place]) 01-17-13   past hx. -tx.5-6 yrs ago bilateral legs, occ. sporadic swelling, has IVC filter implanted  . Dysrhythmia    "heart tends to flutter"  . Fibromyalgia   . Fracture    right foot and is in a cam boot  . Gallstones   . GERD (gastroesophageal reflux disease)    hx of-no meds now  . Heart murmur   . History of bronchitis 2012 or 2013  . History of colon polyps   . Hypertension    takes Metoprolol daily  . Insomnia    takes Melatonin daily  . Joint pain   . Joint swelling   . Pelvic floor dysfunction   . Peripheral neuropathy   . Pneumonia 90's   hx of  . S/P gastric bypass   . Sleep apnea    no cpap  used in many yrs after weight lost-no machine now  . Urinary frequency   . Urinary urgency   . Vitamin D deficiency    Past Surgical History:  Procedure Laterality Date  . BALLOON DILATION N/A 01/31/2013   Procedure: BALLOON DILATION;  Surgeon: Arta Silence, MD;  [facility];  Service: Endoscopy;  Laterality: N/A;  . CHOLECYSTECTOMY  2007  . COLONOSCOPY    . DILATION AND CURETTAGE OF UTERUS  yrs ago  . ESOPHAGOGASTRODUODENOSCOPY (EGD) WITH PROPOFOL  03/01/2012   Procedure: ESOPHAGOGASTRODUODENOSCOPY (EGD) WITH PROPOFOL;  Surgeon: Arta Silence, MD;  [facility];  Service: Endoscopy;  Laterality: N/A;  . ESOPHAGOGASTRODUODENOSCOPY (EGD) WITH PROPOFOL N/A 01/31/2013   Procedure: ESOPHAGOGASTRODUODENOSCOPY (EGD) WITH PROPOFOL;  Surgeon: Arta Silence, MD;  [facility];  Service: Endoscopy;  Laterality: N/A;  . ESOPHAGOGASTRODUODENOSCOPY (EGD) WITH PROPOFOL N/A 09/02/2015   Procedure: ESOPHAGOGASTRODUODENOSCOPY (EGD) WITH PROPOFOL;  Surgeon: Gatha Mayer, MD;  [facility];  Service: Endoscopy;  Laterality: N/A;  . GASTRIC BY-PASS  2004  . HERNIA REPAIR  2005  . INSERTION OF VENA CAVA FILTER  01-17-13   inserted 2004- "abdomen"  . LIGAMENT REPAIR Right 1987   Rt. knee scope  . MAXIMUM ACCESS (  MAS)POSTERIOR LUMBAR INTERBODY FUSION (PLIF) 2 LEVEL N/A 06/07/2014   Procedure: L4-5 L5-S1 FOR MAXIMUM ACCESS (MAS) POSTERIOR LUMBAR INTERBODY FUSION ;  Surgeon: Erline Levine, MD;  Location: [facility];  Service: Neurosurgery;  Laterality: N/A;  L4-5 L5-S1 FOR MAXIMUM ACCESS (MAS) POSTERIOR LUMBAR INTERBODY FUSION   . TONSILLECTOMY     as child   Social History   Socioeconomic History  . Marital status: Widowed    Spouse name: Not on file  . Number of children: 0  . Years of education: college  . Highest education level: Not on file  Occupational History  . Occupation: retired  Scientific laboratory technician  . Financial resource strain: Not on file  . Food  insecurity:    Worry: Not on file    Inability: Not on file  . Transportation needs:    Medical: Not on file    Non-medical: Not on file  Tobacco Use  . Smoking status: Former Smoker    Packs/day: 0.50    Years: 10.00    Pack years: 5.00    Types: Cigarettes    Last attempt to quit: 04/19/2009    Years since quitting: 9.1  . Smokeless tobacco: Never Used  Substance and Sexual Activity  . Alcohol use: Yes    Alcohol/week: 0.0 standard drinks    Comment: occ. social- wine-1 drink monthly  . Drug use: No  . Sexual activity: Not Currently  Lifestyle  . Physical activity:    Days per week: Not on file    Minutes per session: Not on file  . Stress: Not on file  Relationships  . Social connections:    Talks on phone: Not on file    Gets together: Not on file    Attends religious service: Not on file    Active member of club or organization: Not on file    Attends meetings of clubs or organizations: Not on file    Relationship status: Not on file  Other Topics Concern  . Not on file  Social History Narrative  . Not on file   Allergies  Allergen Reactions  . Carafate [Sucralfate] Hives    Other reaction(s): Angioedema (ALLERGY/intolerance) hives scratching  . Sulfonamide Derivatives Rash    REACTION: Syncope   Family History  Problem Relation Age of Onset  . Diabetes Mother   . Heart disease Father   . Hypertension Father   . Prostate cancer Father   . Kidney disease Brother   . Colon cancer Neg Hx   . Colon polyps Neg Hx   . Stomach cancer Neg Hx   . Esophageal cancer Neg Hx   . Pancreatic cancer Neg Hx   . Liver disease Neg Hx      Current Outpatient Medications (Cardiovascular):  .  furosemide (LASIX) 40 MG tablet, TAKE 1 TABLET DAILY AS NEEDED FOR EDEMA  Current Outpatient Medications (Respiratory):  .  albuterol (PROVENTIL HFA;VENTOLIN HFA) 108 (90 Base) MCG/ACT inhaler, Inhale 1-2 puffs into the lungs every 6 (six) hours as needed for wheezing or  shortness of breath.  Current Outpatient Medications (Analgesics):  .  diclofenac (VOLTAREN) 75 MG EC tablet, Take 1 tablet (75 mg total) by mouth 2 (two) times daily. .  traMADol (ULTRAM) 50 MG tablet, TAKE 1 TABLET(50 MG) BY MOUTH FOUR TIMES DAILY   Current Outpatient Medications (Other):  Marland Kitchen  ACCU-CHEK SOFTCLIX LANCETS lancets, CHECK BLOOD SUGARS TWICE DAILY .  Alcohol Swabs (B-D SINGLE USE SWABS REGULAR) PADS, USE TO WIPE AREA  TO CHECK BLOOD SUGAR DAILY  .  BLACK COHOSH PO, Take 1 tablet by mouth daily. .  Blood Glucose Calibration (ACCU-CHEK AVIVA) SOLN, Use as directed .  Blood Glucose Monitoring Suppl (ACCU-CHEK AVIVA PLUS) w/Device KIT, USE AS DIRECTED  TO CHECK BLOOD SUGAR EVERY DAY .  buPROPion (WELLBUTRIN SR) 150 MG 12 hr tablet, TAKE 1 TABLET(150 MG) BY MOUTH TWICE DAILY .  DULoxetine (CYMBALTA) 30 MG capsule, Take 1-2 capsules (30-60 mg total) by mouth daily. Marland Kitchen  gabapentin (NEURONTIN) 400 MG capsule, TAKE 1 CAPSULE FOUR TIMES DAILY .  glucose blood (ACCU-CHEK AVIVA PLUS) test strip, 1 each by Other route 2 (two) times daily. Use to check blood sugars twice a day .  Multiple Vitamins-Minerals (MULTIVITAMINS THER. W/MINERALS) TABS, Take 1 tablet by mouth daily.     Past medical history, social, surgical and family history all reviewed in electronic medical record.  No pertanent information unless stated regarding to the chief complaint.   Review of Systems:  No, visual changes, nausea, vomiting, diarrhea, constipation, dizziness, abdominal pain, skin rash, fevers, chills, night sweats, weight loss, swollen lymph nodes, , joint swelling, chest pain, shortness of breath, mood changes.  Positive headaches, difficulty with speech, mild dizziness, body aches and muscle aches  Objective  Blood pressure (!) 152/96, pulse 72, height _0  (1.676 m), weight 261 lb (118.4 kg), SpO2 96 %.   General: Patient does appear to be significantly anxious. Having difficulty speaking.  HEENT:  Pupils equal, extraocular movements intact  Respiratory: P unable to speak in sentences. Cardiovascular: Trace lower extremity edema, mild tender, no erythema  Abdomen: Soft nontender  Neuro: Cranial nerves II through XII mild weakness on the left side of the face. Lymph: No lymphadenopathy of posterior or anterior cervical chain or axillae bilaterally.  Gait antalgic MSK: Weakness noted with 4-5 strength of all the extremities.  Moderate to severe arthritic changes of multiple joints.  With testing patient became fairly anxious because she did notice some of the weakness that was occurring.  Left upper extremity seem to be weaker than the other ones minorly.      Impression and Recommendations:     This case required medical decision making of moderate complexity. The above documentation has been reviewed and is accurate and complete Lyndal Pulley, DO       Note: This dictation was prepared with Dragon dictation along with smaller phrase technology. Any transcriptional errors that result from this process are unintentional.

## 2018-06-15 NOTE — Assessment & Plan Note (Signed)
Weakness noted.  Patient also has slurred speech.  Called EMS to transport patient.  Because these symptoms did start the day before we will hold on any type occult stroke at the moment.  Vitals were still elevated blood pressure at the time of leaving.  Patient still stated that she still had a headache.

## 2018-06-15 NOTE — ED Notes (Signed)
This RN and Dr Francia Greaves in room with pt. Pt has slow speech and appears to have expressive aphasia. Pt state that sx started yesterday around 1800 but answered a couple questions incorrectly.

## 2018-06-15 NOTE — ED Triage Notes (Signed)
Pt BIB [facility] from her ortho doc's office. Being evaluated for bilateral leg weakness. Pt's doctor noted her speech was slower and more slurred than normal. LKW yesterday. Pt reports headache that started today. EMS vitals: BP 180/97, CBG 77.

## 2018-06-15 NOTE — Discharge Instructions (Signed)
Return for any problem.  Follow-up with your regular care provider as instructed. °

## 2018-06-19 ENCOUNTER — Ambulatory Visit (INDEPENDENT_AMBULATORY_CARE_PROVIDER_SITE_OTHER): Payer: Medicare HMO | Admitting: Internal Medicine

## 2018-06-19 ENCOUNTER — Encounter: Payer: Self-pay | Admitting: Internal Medicine

## 2018-06-19 DIAGNOSIS — F4323 Adjustment disorder with mixed anxiety and depressed mood: Secondary | ICD-10-CM

## 2018-06-19 DIAGNOSIS — I10 Essential (primary) hypertension: Secondary | ICD-10-CM | POA: Diagnosis not present

## 2018-06-19 DIAGNOSIS — R531 Weakness: Secondary | ICD-10-CM | POA: Diagnosis not present

## 2018-06-19 DIAGNOSIS — M797 Fibromyalgia: Secondary | ICD-10-CM | POA: Diagnosis not present

## 2018-06-19 MED ORDER — PREGABALIN 75 MG PO CAPS: 75.0000 mg | ORAL_CAPSULE | Freq: Two times a day (BID) | ORAL | 0 refills | Status: DC

## 2018-06-19 MED ORDER — AMLODIPINE BESYLATE 10 MG PO TABS: 10.0000 mg | ORAL_TABLET | Freq: Every day | ORAL | 3 refills | Status: DC

## 2018-06-19 NOTE — Patient Instructions (Addendum)
For the lyrica that you can start taking. No not take gabapentin and lyrica on the same day. You do not need to decrease gabapentin prior to stopping.   We have sent in lyrica to take 1 pill twice a day for 2 days (75 mg twice a day). Then you can increase to 2 pills twice a day for 1-2 weeks (total dose 150 mg twice a day).   If still having pain after that let us know and we can increase this to 3 pills twice a day (225 mg twice a day). Let us know what dose helps and we can change the refills to the right dose to just take 1 pill twice a day.   We have sent in amlodipine to help with blood pressure until we get this figured out. Take 1 pill daily.

## 2018-06-19 NOTE — Progress Notes (Signed)
   Subjective:   Patient ID: [name], female    DOB: 12-18-1955, 63 y.o.   MRN: [id number]  HPI The patient is a 63 YO female coming in for ER follow up (was in the office with high BP and slurred speech and sent to ER for rule out stroke, had labs and MRI and no stroke found, symptoms did resolve gradually). She has been feeling about the same recently. She is having blood pressures some elevated at home. Mild headaches during this time. She is still struggling with weakness and pain. Denies new falls or injuries. She is also concerned that she has been gaining weight steadily since starting gabapentin and wants to know if lyrica which she has heard about would do the same thing.   PMH, [facility] social history reviewed and updated  Review of Systems  Constitutional: Negative.   HENT: Negative.   Eyes: Negative.   Respiratory: Negative for cough, chest tightness and shortness of breath.   Cardiovascular: Negative for chest pain, palpitations and leg swelling.  Gastrointestinal: Negative for abdominal distention, abdominal pain, constipation, diarrhea, nausea and vomiting.  Musculoskeletal: Positive for arthralgias, back pain and myalgias.  Skin: Negative.   Neurological: Positive for weakness. Negative for syncope, speech difficulty and light-headedness.  Psychiatric/Behavioral: Positive for dysphoric mood. Negative for decreased concentration, self-injury, sleep disturbance and suicidal ideas. The patient is nervous/anxious.     Objective:  Physical Exam Constitutional:      Appearance: She is well-developed. She is obese.  HENT:     Head: Normocephalic and atraumatic.  Neck:     Musculoskeletal: Normal range of motion.  Cardiovascular:     Rate and Rhythm: Normal rate and regular rhythm.  Pulmonary:     Effort: Pulmonary effort is normal. No respiratory distress.     Breath sounds: Normal breath sounds. No wheezing or rales.  Abdominal:     General: Bowel sounds are  normal. There is no distension.     Palpations: Abdomen is soft.     Tenderness: There is no abdominal tenderness. There is no rebound.  Skin:    General: Skin is warm and dry.  Neurological:     Mental Status: She is alert and oriented to person, place, and time.     Coordination: Coordination normal.     Vitals:   06/19/18 1558  BP: (!) 178/100  Pulse: 88  Temp: 98.6 F (37 C)  TempSrc: Oral  SpO2: 96%  Weight: 261 lb (118.4 kg)  Height: 5\' 6"  (1.676 m)    Assessment & Plan:

## 2018-06-20 ENCOUNTER — Telehealth: Payer: Self-pay

## 2018-06-20 NOTE — Assessment & Plan Note (Signed)
Due to weight gain will change gabapentin to lyrica. Will switch to lyrica 75 mg BID for 2 days then increase to 150 mg BID for 1-2 weeks and then increase to 225 mg BID if needed. See her back in 1 month and can adjust sooner if needed. Stop gabapentin.

## 2018-06-20 NOTE — Assessment & Plan Note (Signed)
Not on meds currently but will add amlodipine 10 mg daily for recently persistently high BP with headaches.

## 2018-06-20 NOTE — Assessment & Plan Note (Signed)
She is still doing grief counseling. 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 She does not feel that counseling would help at this time. She is taking cymbalta which we recently switched from wellbutrin and we are still adjusting.

## 2018-06-20 NOTE — Telephone Encounter (Signed)
PA started on CoverMyMeds KEY: [id number]

## 2018-06-20 NOTE — Assessment & Plan Note (Signed)
Acute weakness has resolved. She is still having the chronic problems and we are working on this. MRI ruled out stroke and we are working on BP control as well.

## 2018-06-21 NOTE — Telephone Encounter (Signed)
PA approved until 04/19/2019

## 2018-07-10 ENCOUNTER — Other Ambulatory Visit: Payer: Self-pay | Admitting: Internal Medicine

## 2018-07-20 ENCOUNTER — Ambulatory Visit (INDEPENDENT_AMBULATORY_CARE_PROVIDER_SITE_OTHER): Payer: Medicare HMO | Admitting: Internal Medicine

## 2018-07-20 ENCOUNTER — Encounter: Payer: Self-pay | Admitting: Internal Medicine

## 2018-07-20 DIAGNOSIS — R252 Cramp and spasm: Secondary | ICD-10-CM

## 2018-07-20 DIAGNOSIS — I10 Essential (primary) hypertension: Secondary | ICD-10-CM

## 2018-07-20 DIAGNOSIS — M797 Fibromyalgia: Secondary | ICD-10-CM

## 2018-07-20 MED ORDER — PREGABALIN 300 MG PO CAPS: 300.0000 mg | ORAL_CAPSULE | Freq: Two times a day (BID) | ORAL | 5 refills | Status: DC

## 2018-07-20 NOTE — Assessment & Plan Note (Signed)
Taking lasix, amlodipine and BP is much closer to goal at this time. Will monitor at home and if increasing she will contact us. Follow up in 3 months.

## 2018-07-20 NOTE — Assessment & Plan Note (Signed)
Lyrica is working well at 300 mg BID and has given her more improvement from gabapentin. She is also taking wellbutrin and cymbalta.

## 2018-07-20 NOTE — Progress Notes (Signed)
Virtual Visit via Video Note  I connected with [name] on 07/20/18 at  2:40 PM EDT by a video enabled telemedicine application and verified that I am speaking with the correct person using two identifiers.   I discussed the limitations of evaluation and management by telemedicine and the availability of in person appointments. The patient expressed understanding and agreed to proceed.  History of Present Illness: The patient is a 63 y.o. YO female with visit for follow up of her fibromyalgia (we stopped gabapentin and started lyrica with titration dosing, denies side effects, maybe some tiredness which does not bother her, denies fevers or chills, denies new balance problems, weight stable) and blood pressure (previously was running 160-170/100s, started on amlodipine 10 mg, recent BP at home are running 140-150/80s mostly, denies chest pains, does have rare headaches, these are much improved from prior) and leg cramps (mostly better, some cramps still, is drinking plenty of fluids, denies fevers or chills, denies body aches).   Observations/Objective: Appearance: normal, breathing appears normal, good grooming, abdomen does not appear distended, throat normal, memory normal, mental status is A and O times 3 BP 138/113 today, mostly 140-150/80-90s at home  Assessment and Plan: See problem oriented charting  Follow Up Instructions: keep lyrica 300 mg BID (new rx done) and continue amlodipine 10 mg daily for blood pressure  I discussed the assessment and treatment plan with the patient. The patient was provided an opportunity to ask questions and all were answered. The patient agreed with the plan and demonstrated an understanding of the instructions.   The patient was advised to call back or seek an in-person evaluation if the symptoms worsen or if the condition fails to improve as anticipated.  Hoyt Koch, MD

## 2018-07-20 NOTE — Assessment & Plan Note (Signed)
Doing better with lyrica and frequent fluids and encouraged to continue with this.

## 2018-07-31 ENCOUNTER — Other Ambulatory Visit: Payer: Self-pay | Admitting: *Deleted

## 2018-07-31 MED ORDER — AMLODIPINE BESYLATE 10 MG PO TABS: 10.0000 mg | ORAL_TABLET | Freq: Every day | ORAL | 1 refills | Status: DC

## 2018-07-31 MED ORDER — DULOXETINE HCL 30 MG PO CPEP: 30.0000 mg | ORAL_CAPSULE | Freq: Every day | ORAL | 1 refills | Status: DC

## 2018-07-31 NOTE — Telephone Encounter (Signed)
Received fax requesting to change all meds over to mail order. Amlodipine and duloxetine sent. Ok to send Lyrica and Tramadol?  [place] Controlled Database checked Lyrica LF: 07/26/18 # 60 Tramadol LF: 07/20/18 # 120 LOV w/you: 07/20/18

## 2018-08-04 ENCOUNTER — Other Ambulatory Visit: Payer: Self-pay | Admitting: Internal Medicine

## 2018-08-04 NOTE — Telephone Encounter (Signed)
Requested medication (s) are due for refill today: NO  Requested medication (s) are on the active medication list: YES  Last refill:  Tramadol: 05/19/18                   Pregabalin: 07/20/18  Future visit scheduled: NO  Notes to clinic: Pt wants to switch pharmacies   Requested Prescriptions  Pending Prescriptions Disp Refills   traMADol (ULTRAM) 50 MG tablet 120 tablet 5    Sig: TAKE 1 TABLET(50 MG) BY MOUTH FOUR TIMES DAILY     Not Delegated - Analgesics:  Opioid Agonists Failed - 08/04/2018  2:41 PM      Failed - This refill cannot be delegated      Failed - Urine Drug Screen completed in last 360 days.      Passed - Valid encounter within last 6 months    Recent Outpatient Visits          2 weeks ago [facility] -Chuck Hint, MD   1 month ago [facility] -Chuck Hint, MD   1 month ago Weakness   [place], [place], DO   2 months ago Weakness   [place], Elizabeth A, MD   2 months ago Cough   [place] [facility], Standley Brooking, MD            pregabalin (LYRICA) 300 MG capsule 60 capsule 5    Sig: Take 1 capsule (300 mg total) by mouth 2 (two) times daily.     Not Delegated - Neurology:  Anticonvulsants - Controlled Failed - 08/04/2018  2:41 PM      Failed - This refill cannot be delegated      Passed - Valid encounter within last 12 months    Recent Outpatient Visits          2 weeks ago [place], Elizabeth A, MD   1 month ago [facility] -Chuck Hint, MD   1 month ago Weakness   [place], [place], DO   2 months ago Weakness   [facility] -Chuck Hint, MD   2 months ago Cough   [place] at  [facility], Standley Brooking, MD

## 2018-08-04 NOTE — Telephone Encounter (Signed)
Control database checked last refill: Tramadol 07/20/2018 Lyrica 07/26/2018  LOV:07/20/2018 NOV: none

## 2018-08-18 ENCOUNTER — Encounter: Payer: Self-pay | Admitting: Internal Medicine

## 2018-08-21 ENCOUNTER — Encounter: Payer: Self-pay | Admitting: Internal Medicine

## 2018-08-21 ENCOUNTER — Ambulatory Visit (INDEPENDENT_AMBULATORY_CARE_PROVIDER_SITE_OTHER): Payer: Medicare HMO | Admitting: Internal Medicine

## 2018-08-21 DIAGNOSIS — N390 Urinary tract infection, site not specified: Secondary | ICD-10-CM

## 2018-08-21 DIAGNOSIS — M5416 Radiculopathy, lumbar region: Secondary | ICD-10-CM | POA: Diagnosis not present

## 2018-08-21 DIAGNOSIS — M797 Fibromyalgia: Secondary | ICD-10-CM | POA: Diagnosis not present

## 2018-08-21 MED ORDER — TRAMADOL HCL 50 MG PO TABS: ORAL_TABLET | ORAL | 2 refills | Status: DC

## 2018-08-21 MED ORDER — CIPROFLOXACIN HCL 500 MG PO TABS: 500.0000 mg | ORAL_TABLET | Freq: Two times a day (BID) | ORAL | 0 refills | Status: DC

## 2018-08-21 NOTE — Assessment & Plan Note (Signed)
Rx for cipro 5 day course and if symptoms still present needs U/A and culture.

## 2018-08-21 NOTE — Assessment & Plan Note (Signed)
Encouraged increase in cymbalta to 60 mg daily, continue lyrica 300 mg BID. Refilled tramadol #120 with 2 refills.

## 2018-08-21 NOTE — Assessment & Plan Note (Signed)
Refill tramadol #120 with 2 refills, increase cymbalta to 60 mg daily and continue lyrica 300 mg BID

## 2018-08-21 NOTE — Progress Notes (Signed)
Virtual Visit via Video Note  I connected with [name] on 08/21/18 at 11:20 AM EDT by a video enabled telemedicine application and verified that I am speaking with the correct person using two identifiers.  The patient and the provider were at separate locations throughout the entire encounter.   I discussed the limitations of evaluation and management by telemedicine and the availability of in person appointments. The patient expressed understanding and agreed to proceed.  History of Present Illness: The patient is a 63 y.o. 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  Observations/Objective: Appearance: normal, breathing appears normal, casual grooming, abdomen does not appear distended, throat normal, pain in the lumbar region to self palpation and suprapubic region, no appearance of rebound or guarding to self palpation, mental status is A and O times 3  Assessment and Plan: See problem oriented charting  Follow Up Instructions: rx cipro for likely UTI, increase cymbalta  to 60 mg daily, refill tramadol to different pharmacy  I discussed the assessment and treatment plan with the patient. The patient was provided an opportunity to ask questions and all were answered. The patient agreed with the plan and demonstrated an understanding of the instructions.   The patient was advised to call back or seek an in-person evaluation if the symptoms worsen or if the condition fails to improve as anticipated.  Hoyt Koch, MD

## 2018-08-30 ENCOUNTER — Other Ambulatory Visit: Payer: Self-pay | Admitting: Internal Medicine

## 2018-09-04 ENCOUNTER — Other Ambulatory Visit: Payer: Self-pay | Admitting: Internal Medicine

## 2018-10-09 ENCOUNTER — Encounter: Payer: Self-pay | Admitting: Internal Medicine

## 2018-10-09 ENCOUNTER — Ambulatory Visit (INDEPENDENT_AMBULATORY_CARE_PROVIDER_SITE_OTHER): Payer: Medicare HMO | Admitting: Internal Medicine

## 2018-10-09 DIAGNOSIS — M7989 Other specified soft tissue disorders: Secondary | ICD-10-CM | POA: Diagnosis not present

## 2018-10-09 DIAGNOSIS — Z86718 Personal history of other venous thrombosis and embolism: Secondary | ICD-10-CM | POA: Diagnosis not present

## 2018-10-09 MED ORDER — CEPHALEXIN 500 MG PO CAPS: 500.0000 mg | ORAL_CAPSULE | Freq: Three times a day (TID) | ORAL | 0 refills | Status: DC

## 2018-10-09 NOTE — Progress Notes (Signed)
Virtual Visit via Video Note  I connected with [name] on 10/09/18 at 11:00 AM EDT by a video enabled telemedicine application and verified that I am speaking with the correct person using two identifiers.  The patient and the provider were at separate locations throughout the entire encounter.   I discussed the limitations of evaluation and management by telemedicine and the availability of in person appointments. The patient expressed understanding and agreed to proceed.  History of Present Illness: The patient is a 63 y.o. female with visit for left leg pain and swelling and redness. Started about 1 week ago. Has been progressing. Started with rash and then the swelling started 2 days or so ago. The rash in on the lower leg and seems to be stable. She is having pain in the leg. Hx DVT in both legs 2014 and she is not sure if this is similar or not. Does feel similar to cellulitis she had right leg back in the fall but she did not have as much swelling then. Denies fevers or chills. Denies SOB or cough. Denies muscle aches. Overall it is worsening. Has tried ice and elevating legs which helps temporarily.  Observations/Objective: Appearance: normal, breathing appears normal, casual grooming, abdomen does not appear distended, throat normal, left leg with swelling to the knee, hard to visualize rash on the leg due to poor video feed but present on the lower leg, mental status is A and O times 3  Assessment and Plan: See problem oriented charting  Follow Up Instructions: STAT US left lower leg, start keflex for likely cellulitis  I discussed the assessment and treatment plan with the patient. The patient was provided an opportunity to ask questions and all were answered. The patient agreed with the plan and demonstrated an understanding of the instructions.   The patient was advised to call back or seek an in-person evaluation if the symptoms worsen or if the condition fails to improve  as anticipated.  Jessica Koch, MD

## 2018-10-09 NOTE — Assessment & Plan Note (Signed)
Not on anticoagulation currently. Needs [place] to rule out new DVT.

## 2018-10-09 NOTE — Assessment & Plan Note (Signed)
Could be cellulitis or new DVT. Needs stat US left lower extremity to rule out DVT. She did have IVC filter in 2014 placed. Rx keflex to start for cellulitis. Depending on clinical response and [place] will guide therapy further.

## 2018-10-10 ENCOUNTER — Ambulatory Visit (HOSPITAL_COMMUNITY)
Admission: RE | Admit: 2018-10-10 | Discharge: 2018-10-10 | Disposition: A | Discharge status: Home / Self Care | Payer: Medicare HMO | Source: Ambulatory Visit | Attending: Cardiology | Admitting: Cardiology

## 2018-10-10 ENCOUNTER — Telehealth: Payer: Self-pay

## 2018-10-10 ENCOUNTER — Other Ambulatory Visit: Payer: Self-pay

## 2018-10-10 DIAGNOSIS — M7989 Other specified soft tissue disorders: Secondary | ICD-10-CM | POA: Diagnosis not present

## 2018-10-10 DIAGNOSIS — Z86718 Personal history of other venous thrombosis and embolism: Secondary | ICD-10-CM | POA: Diagnosis not present

## 2018-10-10 NOTE — Telephone Encounter (Signed)
Ok, no change to plan from visit.

## 2018-10-10 NOTE — Telephone Encounter (Signed)
Noted  

## 2018-10-10 NOTE — Telephone Encounter (Signed)
FYI [name] from heart care called and wanted to let us know that the [place] was negative for DVT. Limited study due to amount of pain patient was in so couldn't get a full picture of calf veins. Patient was sent home.

## 2018-10-12 ENCOUNTER — Encounter: Payer: Self-pay | Admitting: Internal Medicine

## 2018-10-18 ENCOUNTER — Encounter: Payer: Self-pay | Admitting: Internal Medicine

## 2018-10-19 ENCOUNTER — Encounter: Payer: Self-pay | Admitting: Internal Medicine

## 2018-10-19 ENCOUNTER — Ambulatory Visit (INDEPENDENT_AMBULATORY_CARE_PROVIDER_SITE_OTHER): Payer: Medicare HMO | Admitting: Internal Medicine

## 2018-10-19 DIAGNOSIS — L03116 Cellulitis of left lower limb: Secondary | ICD-10-CM | POA: Insufficient documentation

## 2018-10-19 DIAGNOSIS — F331 Major depressive disorder, recurrent, moderate: Secondary | ICD-10-CM

## 2018-10-19 DIAGNOSIS — Z8639 Personal history of other endocrine, nutritional and metabolic disease: Secondary | ICD-10-CM | POA: Diagnosis not present

## 2018-10-19 MED ORDER — CLINDAMYCIN HCL 300 MG PO CAPS: 300.0000 mg | ORAL_CAPSULE | Freq: Three times a day (TID) | ORAL | 0 refills | Status: DC

## 2018-10-19 MED ORDER — MUPIROCIN 2 % EX OINT: 1.0000 "application " | TOPICAL_OINTMENT | Freq: Two times a day (BID) | CUTANEOUS | 0 refills | Status: DC

## 2018-10-19 NOTE — Assessment & Plan Note (Signed)
stable overall by history and exam, recent data reviewed with pt, and pt to continue medical treatment as before,  to f/u any worsening symptoms or concerns  

## 2018-10-19 NOTE — Progress Notes (Signed)
Patient ID: CEDAR ROSEMAN, female   DOB: [birth year], 63 y.o.   MRN: [id number]  Virtual Visit via Video Note  I connected with [name] on 10/19/18 at  2:40 PM EDT by a video enabled telemedicine application and verified that I am speaking with the correct person using two identifiers.  Location: Patient: at home Provider: at office   I discussed the limitations of evaluation and management by telemedicine and the availability of in person appointments. The patient expressed understanding and agreed to proceed.  History of Present Illness: Here to f/u recent onset left leg red, tender, swelling  X > 2 wks, mild to mod but gradually worsening, worse pain to walk, better to sit, without fever, chills.  Recent venous study neg for DVT.  No better with recent cephalexin course finished.  Denies knee pain.  Has hx of MRSA related to a 3 day stay in [place] in [place] 2018 without recurrence she is aware.  Pt denies chest pain, increased sob or doe, wheezing, orthopnea, PND, increased LE swelling, palpitations, dizziness or syncope.  Pt denies new neurological symptoms such as new headache, or facial or extremity weakness or numbness   Pt denies polydipsia, polyuria  Denies worsening depressive symptoms, suicidal ideation, Past Medical History:  Diagnosis Date  . Anemia    takes Ferrous Sulfate daily  . Anxiety    takes Citalopram daily  . Arthritis   . Asthma    2004-prior to gastric bypass and no problems since  . CHF (congestive heart failure) (HCC)    takes Lasix daily as needed  . Chronic back pain    spondylolisthesis/stenosis/radiculopathy  . Complication of anesthesia yrs ago   slow to wake up  . Depression   . Diabetes ([place])   . DVT (deep venous thrombosis) ([place]) 01-17-13   past hx. -tx.5-6 yrs ago bilateral legs, occ. sporadic swelling, has IVC filter implanted  . Dysrhythmia    "heart tends to flutter"  . Fibromyalgia   . Fracture    right foot and is in a cam  boot  . Gallstones   . GERD (gastroesophageal reflux disease)    hx of-no meds now  . Heart murmur   . History of bronchitis 2012 or 2013  . History of colon polyps   . Hypertension    takes Metoprolol daily  . Insomnia    takes Melatonin daily  . Joint pain   . Joint swelling   . Pelvic floor dysfunction   . Peripheral neuropathy   . Pneumonia 90's   hx of  . S/P gastric bypass   . Sleep apnea    no cpap used in many yrs after weight lost-no machine now  . Urinary frequency   . Urinary urgency   . Vitamin D deficiency    Past Surgical History:  Procedure Laterality Date  . BALLOON DILATION N/A 01/31/2013   Procedure: BALLOON DILATION;  Surgeon: Arta Silence, MD;  Location: [facility];  Service: Endoscopy;  Laterality: N/A;  . CHOLECYSTECTOMY  2007  . COLONOSCOPY    . DILATION AND CURETTAGE OF UTERUS  yrs ago  . ESOPHAGOGASTRODUODENOSCOPY (EGD) WITH PROPOFOL  03/01/2012   Procedure: ESOPHAGOGASTRODUODENOSCOPY (EGD) WITH PROPOFOL;  Surgeon: Arta Silence, MD;  Location: [facility];  Service: Endoscopy;  Laterality: N/A;  . ESOPHAGOGASTRODUODENOSCOPY (EGD) WITH PROPOFOL N/A 01/31/2013   Procedure: ESOPHAGOGASTRODUODENOSCOPY (EGD) WITH PROPOFOL;  Surgeon: Arta Silence, MD;  Location: [facility];  Service: Endoscopy;  Laterality: N/A;  . ESOPHAGOGASTRODUODENOSCOPY (EGD)  WITH PROPOFOL N/A 09/02/2015   Procedure: ESOPHAGOGASTRODUODENOSCOPY (EGD) WITH PROPOFOL;  Surgeon: Gatha Mayer, MD;  Location: [facility];  Service: Endoscopy;  Laterality: N/A;  . GASTRIC BY-PASS  2004  . HERNIA REPAIR  2005  . INSERTION OF VENA CAVA FILTER  01-17-13   inserted 2004- "abdomen"  . LIGAMENT REPAIR Right 1987   Rt. knee scope  . MAXIMUM ACCESS (MAS)POSTERIOR LUMBAR INTERBODY FUSION (PLIF) 2 LEVEL N/A 06/07/2014   Procedure: L4-5 L5-S1 FOR MAXIMUM ACCESS (MAS) POSTERIOR LUMBAR INTERBODY FUSION ;  Surgeon: Erline Levine, MD;  Location: [facility];  Service: Neurosurgery;  Laterality:  N/A;  L4-5 L5-S1 FOR MAXIMUM ACCESS (MAS) POSTERIOR LUMBAR INTERBODY FUSION   . TONSILLECTOMY     as child    reports that she quit smoking about 9 years ago. Her smoking use included cigarettes. She has a 5.00 pack-year smoking history. She has never used smokeless tobacco. She reports current alcohol use. She reports that she does not use drugs. family history includes Diabetes in her mother; Heart disease in her father; Hypertension in her father; Kidney disease in her brother; Prostate cancer in her father. Allergies  Allergen Reactions  . Carafate [Sucralfate] Hives    Other reaction(s): Angioedema (ALLERGY/intolerance) hives scratching  . Sulfonamide Derivatives Rash    REACTION: Syncope   Current Outpatient Medications on File Prior to Visit  Medication Sig Dispense Refill  . ACCU-CHEK SOFTCLIX LANCETS lancets CHECK BLOOD SUGARS TWICE DAILY 200 each 2  . albuterol (PROVENTIL HFA;VENTOLIN HFA) 108 (90 Base) MCG/ACT inhaler Inhale 1-2 puffs into the lungs every 6 (six) hours as needed for wheezing or shortness of breath. 1 Inhaler 0  . Alcohol Swabs (B-D SINGLE USE SWABS REGULAR) PADS USE TO WIPE AREA TO CHECK BLOOD SUGAR DAILY  100 each 2  . amLODipine (NORVASC) 10 MG tablet Take 1 tablet (10 mg total) by mouth daily. 90 tablet 1  . BLACK COHOSH PO Take 1 tablet by mouth daily.    . Blood Glucose Calibration (ACCU-CHEK AVIVA) SOLN Use as directed 3 each 3  . Blood Glucose Monitoring Suppl (ACCU-CHEK AVIVA PLUS) w/Device KIT USE AS DIRECTED  TO CHECK BLOOD SUGAR EVERY DAY 1 kit 0  . buPROPion (WELLBUTRIN SR) 150 MG 12 hr tablet TAKE 1 TABLET(150 MG) BY MOUTH TWICE DAILY 60 tablet 3  . cephALEXin (KEFLEX) 500 MG capsule Take 1 capsule (500 mg total) by mouth 3 (three) times daily. 21 capsule 0  . ciprofloxacin (CIPRO) 500 MG tablet Take 1 tablet (500 mg total) by mouth 2 (two) times daily. 10 tablet 0  . diclofenac (VOLTAREN) 75 MG EC tablet Take 1 tablet (75 mg total) by mouth 2 (two)  times daily. 14 tablet 0  . DULoxetine (CYMBALTA) 30 MG capsule Take 1-2 capsules (30-60 mg total) by mouth daily. 180 capsule 1  . furosemide (LASIX) 40 MG tablet TAKE 1 TABLET DAILY AS NEEDED FOR EDEMA 90 tablet 3  . glucose blood (ACCU-CHEK AVIVA PLUS) test strip 1 each by Other route 2 (two) times daily. Use to check blood sugars twice a day 200 each 2  . Multiple Vitamins-Minerals (MULTIVITAMINS THER. W/MINERALS) TABS Take 1 tablet by mouth daily.     . potassium chloride SA (K-DUR) 20 MEQ tablet TAKE 1 TABLET EVERY DAY 90 tablet 0  . pregabalin (LYRICA) 300 MG capsule Take 1 capsule (300 mg total) by mouth 2 (two) times daily. 60 capsule 5  . traMADol (ULTRAM) 50 MG tablet TAKE 1  TABLET(50 MG) BY MOUTH FOUR TIMES DAILY 120 tablet 2   No current facility-administered medications on file prior to visit.     Observations/Objective: Alert, NAD, appropriate mood and affect, resps normal, cn 2-12 intact, moves all 4s, no visible rash but has LLE pain/red sweling to above the knee, worse it seems to lateral calf area Past Medical History:  Diagnosis Date  . Anemia    takes Ferrous Sulfate daily  . Anxiety    takes Citalopram daily  . Arthritis   . Asthma    2004-prior to gastric bypass and no problems since  . CHF (congestive heart failure) (HCC)    takes Lasix daily as needed  . Chronic back pain    spondylolisthesis/stenosis/radiculopathy  . Complication of anesthesia yrs ago   slow to wake up  . Depression   . Diabetes ([place])   . DVT (deep venous thrombosis) ([place]) 01-17-13   past hx. -tx.5-6 yrs ago bilateral legs, occ. sporadic swelling, has IVC filter implanted  . Dysrhythmia    "heart tends to flutter"  . Fibromyalgia   . Fracture    right foot and is in a cam boot  . Gallstones   . GERD (gastroesophageal reflux disease)    hx of-no meds now  . Heart murmur   . History of bronchitis 2012 or 2013  . History of colon polyps   . Hypertension    takes Metoprolol daily  .  Insomnia    takes Melatonin daily  . Joint pain   . Joint swelling   . Pelvic floor dysfunction   . Peripheral neuropathy   . Pneumonia 90's   hx of  . S/P gastric bypass   . Sleep apnea    no cpap used in many yrs after weight lost-no machine now  . Urinary frequency   . Urinary urgency   . Vitamin D deficiency    Past Surgical History:  Procedure Laterality Date  . BALLOON DILATION N/A 01/31/2013   Procedure: BALLOON DILATION;  Surgeon: Arta Silence, MD;  [facility];  Service: Endoscopy;  Laterality: N/A;  . CHOLECYSTECTOMY  2007  . COLONOSCOPY    . DILATION AND CURETTAGE OF UTERUS  yrs ago  . ESOPHAGOGASTRODUODENOSCOPY (EGD) WITH PROPOFOL  03/01/2012   Procedure: ESOPHAGOGASTRODUODENOSCOPY (EGD) WITH PROPOFOL;  Surgeon: Arta Silence, MD;  [facility];  Service: Endoscopy;  Laterality: N/A;  . ESOPHAGOGASTRODUODENOSCOPY (EGD) WITH PROPOFOL N/A 01/31/2013   Procedure: ESOPHAGOGASTRODUODENOSCOPY (EGD) WITH PROPOFOL;  Surgeon: Arta Silence, MD;  [facility];  Service: Endoscopy;  Laterality: N/A;  . ESOPHAGOGASTRODUODENOSCOPY (EGD) WITH PROPOFOL N/A 09/02/2015   Procedure: ESOPHAGOGASTRODUODENOSCOPY (EGD) WITH PROPOFOL;  Surgeon: Gatha Mayer, MD;  [facility];  Service: Endoscopy;  Laterality: N/A;  . GASTRIC BY-PASS  2004  . HERNIA REPAIR  2005  . INSERTION OF VENA CAVA FILTER  01-17-13   inserted 2004- "abdomen"  . LIGAMENT REPAIR Right 1987   Rt. knee scope  . MAXIMUM ACCESS (MAS)POSTERIOR LUMBAR INTERBODY FUSION (PLIF) 2 LEVEL N/A 06/07/2014   Procedure: L4-5 L5-S1 FOR MAXIMUM ACCESS (MAS) POSTERIOR LUMBAR INTERBODY FUSION ;  Surgeon: Erline Levine, MD;  Location: [facility];  Service: Neurosurgery;  Laterality: N/A;  L4-5 L5-S1 FOR MAXIMUM ACCESS (MAS) POSTERIOR LUMBAR INTERBODY FUSION   . TONSILLECTOMY     as child    reports that she quit smoking about 9 years ago. Her smoking use included cigarettes. She has a 5.00  pack-year smoking history. She has never  used smokeless tobacco. She reports current alcohol use. She reports that she does not use drugs. family history includes Diabetes in her mother; Heart disease in her father; Hypertension in her father; Kidney disease in her brother; Prostate cancer in her father. Allergies  Allergen Reactions  . Carafate [Sucralfate] Hives    Other reaction(s): Angioedema (ALLERGY/intolerance) hives scratching  . Sulfonamide Derivatives Rash    REACTION: Syncope   Current Outpatient Medications on File Prior to Visit  Medication Sig Dispense Refill  . ACCU-CHEK SOFTCLIX LANCETS lancets CHECK BLOOD SUGARS TWICE DAILY 200 each 2  . albuterol (PROVENTIL HFA;VENTOLIN HFA) 108 (90 Base) MCG/ACT inhaler Inhale 1-2 puffs into the lungs every 6 (six) hours as needed for wheezing or shortness of breath. 1 Inhaler 0  . Alcohol Swabs (B-D SINGLE USE SWABS REGULAR) PADS USE TO WIPE AREA TO CHECK BLOOD SUGAR DAILY  100 each 2  . amLODipine (NORVASC) 10 MG tablet Take 1 tablet (10 mg total) by mouth daily. 90 tablet 1  . BLACK COHOSH PO Take 1 tablet by mouth daily.    . Blood Glucose Calibration (ACCU-CHEK AVIVA) SOLN Use as directed 3 each 3  . Blood Glucose Monitoring Suppl (ACCU-CHEK AVIVA PLUS) w/Device KIT USE AS DIRECTED  TO CHECK BLOOD SUGAR EVERY DAY 1 kit 0  . buPROPion (WELLBUTRIN SR) 150 MG 12 hr tablet TAKE 1 TABLET(150 MG) BY MOUTH TWICE DAILY 60 tablet 3  . cephALEXin (KEFLEX) 500 MG capsule Take 1 capsule (500 mg total) by mouth 3 (three) times daily. 21 capsule 0  . ciprofloxacin (CIPRO) 500 MG tablet Take 1 tablet (500 mg total) by mouth 2 (two) times daily. 10 tablet 0  . diclofenac (VOLTAREN) 75 MG EC tablet Take 1 tablet (75 mg total) by mouth 2 (two) times daily. 14 tablet 0  . DULoxetine (CYMBALTA) 30 MG capsule Take 1-2 capsules (30-60 mg total) by mouth daily. 180 capsule 1  . furosemide (LASIX) 40 MG tablet TAKE 1 TABLET DAILY AS NEEDED FOR EDEMA 90  tablet 3  . glucose blood (ACCU-CHEK AVIVA PLUS) test strip 1 each by Other route 2 (two) times daily. Use to check blood sugars twice a day 200 each 2  . Multiple Vitamins-Minerals (MULTIVITAMINS THER. W/MINERALS) TABS Take 1 tablet by mouth daily.     . potassium chloride SA (K-DUR) 20 MEQ tablet TAKE 1 TABLET EVERY DAY 90 tablet 0  . pregabalin (LYRICA) 300 MG capsule Take 1 capsule (300 mg total) by mouth 2 (two) times daily. 60 capsule 5  . traMADol (ULTRAM) 50 MG tablet TAKE 1 TABLET(50 MG) BY MOUTH FOUR TIMES DAILY 120 tablet 2   No current facility-administered medications on file prior to visit.    Assessment and Plan: See notes  Follow Up Instructions: See notes   I discussed the assessment and treatment plan with the patient. The patient was provided an opportunity to ask questions and all were answered. The patient agreed with the plan and demonstrated an understanding of the instructions.   The patient was advised to call back or seek an in-person evaluation if the symptoms worsen or if the condition fails to improve as anticipated.   Cathlean Cower, MD

## 2018-10-19 NOTE — Patient Instructions (Signed)
Please take all new medication as prescribed - the antibiotic pill, and antibiotic ointment for the nose  Please continue all other medications as before, and refills have been done if requested.  Please have the pharmacy call with any other refills you may need.  Please continue your efforts at being more active, low cholesterol diet, and weight control.  Please keep your appointments with your specialists as you may have planned

## 2018-10-19 NOTE — Telephone Encounter (Signed)
Can you call and see if she can come in today?

## 2018-10-19 NOTE — Assessment & Plan Note (Signed)
Pt denies fever but exam by video seems o/w consistent, for cleocin asd, empiric mupirocin,  to f/u any worsening symptoms or concerns

## 2018-10-24 ENCOUNTER — Encounter: Payer: Self-pay | Admitting: Internal Medicine

## 2018-10-25 ENCOUNTER — Encounter: Payer: Self-pay | Admitting: Internal Medicine

## 2018-10-25 ENCOUNTER — Ambulatory Visit (INDEPENDENT_AMBULATORY_CARE_PROVIDER_SITE_OTHER): Payer: Medicare HMO | Admitting: Internal Medicine

## 2018-10-25 ENCOUNTER — Other Ambulatory Visit: Payer: Self-pay | Admitting: Internal Medicine

## 2018-10-25 ENCOUNTER — Other Ambulatory Visit: Payer: Self-pay

## 2018-10-25 VITALS — BP 110/62 | HR 76 | Temp 99.0°F | Ht 66.0 in | Wt 267.0 lb

## 2018-10-25 DIAGNOSIS — M7989 Other specified soft tissue disorders: Secondary | ICD-10-CM

## 2018-10-25 DIAGNOSIS — Z86718 Personal history of other venous thrombosis and embolism: Secondary | ICD-10-CM

## 2018-10-25 MED ORDER — RIVAROXABAN (XARELTO) VTE STARTER PACK (15 & 20 MG): ORAL_TABLET | ORAL | 0 refills | Status: DC

## 2018-10-25 NOTE — Assessment & Plan Note (Signed)
Started xarelto as she has not had normal response to antibiotics and swelling and pain are still persistent. Will refer to vascular to see if they can help with evaluation for DVT since she was unable to tolerate [place].

## 2018-10-25 NOTE — Progress Notes (Signed)
   Subjective:   Patient ID: [name], female    DOB: 01/18/1956, 63 y.o.   MRN: [id number]  HPI The patient is a 63 YO female coming in for concerns about left leg swelling and pain. Started about 1 month ago. Has taken 2 courses of antibiotics which did not really seem to help. Having severe pain in the left leg around the calf. She is having severe calf swelling since onset. Denies fevers or chills. She has been taking lasix which has not helped with the swelling. She does have a history of DVT years ago and was on coumadin afterwards. She cannot recall if this is similar.   Review of Systems  Constitutional: Negative.   HENT: Negative.   Eyes: Negative.   Respiratory: Negative for cough, chest tightness and shortness of breath.   Cardiovascular: Positive for leg swelling. Negative for chest pain and palpitations.  Gastrointestinal: Negative for abdominal distention, abdominal pain, constipation, diarrhea, nausea and vomiting.  Musculoskeletal: Positive for joint swelling and myalgias. Negative for arthralgias, back pain and gait problem.  Skin: Negative.   Neurological: Negative.   Psychiatric/Behavioral: Negative.     Objective:  Physical Exam Constitutional:      Appearance: She is well-developed.  HENT:     Head: Normocephalic and atraumatic.  Neck:     Musculoskeletal: Normal range of motion.  Cardiovascular:     Rate and Rhythm: Normal rate and regular rhythm.  Pulmonary:     Effort: Pulmonary effort is normal. No respiratory distress.     Breath sounds: Normal breath sounds. No wheezing or rales.  Abdominal:     General: Bowel sounds are normal. There is no distension.     Palpations: Abdomen is soft.     Tenderness: There is no abdominal tenderness. There is no rebound.  Musculoskeletal:        General: Swelling and tenderness present.     Left lower leg: Edema present.     Comments: Left leg calf with swelling and pain on sides and back. Significantly  larger than right calf, redness which was previously present is gone at this time  Skin:    General: Skin is warm and dry.  Neurological:     Mental Status: She is alert and oriented to person, place, and time.     Coordination: Coordination normal.     Vitals:   10/25/18 1117  BP: 110/62  Pulse: 76  Temp: 99 F (37.2 C)  TempSrc: Oral  SpO2: 97%  Weight: 267 lb (121.1 kg)  Height: 5\' 6"  (1.676 m)    Assessment & Plan:  Visit time 25 minutes: greater than 50% of that time was spent in face to face counseling and coordination of care with the patient: counseled about likely etiology and possibilities as well as need to know if she has DVT as this would require lifelong therapy.

## 2018-10-25 NOTE — Patient Instructions (Signed)
We have sent in xarelto to take 1 pill twice a day for the first 21 days, then we will switch you to a different strength to take 1 pill daily until you see the vascular doctor.

## 2018-10-25 NOTE — Assessment & Plan Note (Addendum)
At this time the redness which was present on 10/09/18 is not present. She still has pain and swelling. She has history of DVT and needs to be treated empirically for DVT. We will refer to vascular to help identify if she has DVT or not since her prior DVT US was not conclusive due to pain. Rx for xarelto starter pack today and advised her on how to take.

## 2018-11-08 ENCOUNTER — Other Ambulatory Visit: Payer: Self-pay | Admitting: Internal Medicine

## 2018-11-22 ENCOUNTER — Encounter: Payer: Self-pay | Admitting: Internal Medicine

## 2018-11-23 MED ORDER — RIVAROXABAN 20 MG PO TABS: 20.0000 mg | ORAL_TABLET | Freq: Every day | ORAL | 1 refills | Status: DC

## 2018-11-27 ENCOUNTER — Encounter: Payer: Self-pay | Admitting: Internal Medicine

## 2018-11-27 ENCOUNTER — Other Ambulatory Visit: Payer: Self-pay | Admitting: Internal Medicine

## 2018-11-27 NOTE — Telephone Encounter (Signed)
Control database checked last refill: 10/25/2018 120 tabs LOV: 10/25/2018 [id number]

## 2018-11-28 ENCOUNTER — Other Ambulatory Visit: Payer: Self-pay

## 2018-11-28 DIAGNOSIS — R600 Localized edema: Secondary | ICD-10-CM

## 2018-11-28 NOTE — Addendum Note (Signed)
Addended by: York Cerise C on: 11/28/2018 02:59 PM   Modules accepted: Orders

## 2018-11-28 NOTE — Addendum Note (Signed)
Addended by: York Cerise C on: 11/28/2018 02:58 PM   Modules accepted: Orders

## 2018-11-29 ENCOUNTER — Telehealth: Payer: Self-pay

## 2018-11-29 NOTE — Telephone Encounter (Signed)
PA approved through till 04/19/2019

## 2018-12-04 ENCOUNTER — Telehealth (HOSPITAL_COMMUNITY): Payer: Self-pay | Admitting: Rehabilitation

## 2018-12-04 NOTE — Telephone Encounter (Signed)

## 2018-12-05 ENCOUNTER — Ambulatory Visit (INDEPENDENT_AMBULATORY_CARE_PROVIDER_SITE_OTHER): Payer: Medicare HMO | Admitting: Vascular Surgery

## 2018-12-05 ENCOUNTER — Other Ambulatory Visit: Payer: Self-pay

## 2018-12-05 ENCOUNTER — Ambulatory Visit (HOSPITAL_COMMUNITY)
Admission: RE | Admit: 2018-12-05 | Discharge: 2018-12-05 | Disposition: A | Discharge status: Home / Self Care | Payer: Medicare HMO | Source: Ambulatory Visit | Attending: Family | Admitting: Family

## 2018-12-05 ENCOUNTER — Encounter: Payer: Self-pay | Admitting: Vascular Surgery

## 2018-12-05 VITALS — BP 140/81 | HR 76 | Temp 97.8°F | Resp 12 | Ht 67.0 in | Wt 262.4 lb

## 2018-12-05 DIAGNOSIS — M7989 Other specified soft tissue disorders: Secondary | ICD-10-CM

## 2018-12-05 DIAGNOSIS — R600 Localized edema: Secondary | ICD-10-CM

## 2018-12-05 NOTE — Progress Notes (Signed)
Patient name: [name] MRN: [id number] DOB: 07/01/1955 Sex: female  REASON FOR CONSULT: Evaluate left leg swelling  HPI: [name] is a 63 y.o. female, that presents for evaluation of what the referral states is left leg swelling with a previous history of left leg DVT.  Patient states on visit today that she is now having swelling in both legs that are equally affected and has been ongoing for months now.  She states she has a lot of pain and burning particular along the medial thigh and calf in both lower extremities.  She has been to see her primary care doctor and has had several rounds of antibiotics treating her for cellulitis with no improvement.  She has also tried fluid pills and does have a documented history of CHF but this has not shown any significant improvement either.  She reports a history of a left calf DVT in 2003.  She was on anticoagulation for period of time but that was since stopped.  She is ambulatory with a cane.  No previous ulcerations.  Past Medical History:  Diagnosis Date  . Anemia    takes Ferrous Sulfate daily  . Anxiety    takes Citalopram daily  . Arthritis   . Asthma    2004-prior to gastric bypass and no problems since  . CHF (congestive heart failure) (HCC)    takes Lasix daily as needed  . Chronic back pain    spondylolisthesis/stenosis/radiculopathy  . Complication of anesthesia yrs ago   slow to wake up  . Depression   . Diabetes ([place])   . DVT (deep venous thrombosis) ([place]) 01-17-13   past hx. -tx.5-6 yrs ago bilateral legs, occ. sporadic swelling, has IVC filter implanted  . Dysrhythmia    "heart tends to flutter"  . Fibromyalgia   . Fracture    right foot and is in a cam boot  . Gallstones   . GERD (gastroesophageal reflux disease)    hx of-no meds now  . Heart murmur   . History of bronchitis 2012 or 2013  . History of colon polyps   . Hypertension    takes Metoprolol daily  . Insomnia    takes Melatonin daily   . Joint pain   . Joint swelling   . Pelvic floor dysfunction   . Peripheral neuropathy   . Pneumonia 90's   hx of  . S/P gastric bypass   . Sleep apnea    no cpap used in many yrs after weight lost-no machine now  . Urinary frequency   . Urinary urgency   . Vitamin D deficiency     Past Surgical History:  Procedure Laterality Date  . BALLOON DILATION N/A 01/31/2013   Procedure: BALLOON DILATION;  Surgeon: Arta Silence, MD;  Location: [facility];  Service: Endoscopy;  Laterality: N/A;  . CHOLECYSTECTOMY  2007  . COLONOSCOPY    . DILATION AND CURETTAGE OF UTERUS  yrs ago  . ESOPHAGOGASTRODUODENOSCOPY (EGD) WITH PROPOFOL  03/01/2012   Procedure: ESOPHAGOGASTRODUODENOSCOPY (EGD) WITH PROPOFOL;  Surgeon: Arta Silence, MD;  [facility];  Service: Endoscopy;  Laterality: N/A;  . ESOPHAGOGASTRODUODENOSCOPY (EGD) WITH PROPOFOL N/A 01/31/2013   Procedure: ESOPHAGOGASTRODUODENOSCOPY (EGD) WITH PROPOFOL;  Surgeon: Arta Silence, MD;  [facility];  Service: Endoscopy;  Laterality: N/A;  . ESOPHAGOGASTRODUODENOSCOPY (EGD) WITH PROPOFOL N/A 09/02/2015   Procedure: ESOPHAGOGASTRODUODENOSCOPY (EGD) WITH PROPOFOL;  Surgeon: Gatha Mayer, MD;  [facility];  Service: Endoscopy;  Laterality: N/A;  .  GASTRIC BY-PASS  2004  . HERNIA REPAIR  2005  . INSERTION OF VENA CAVA FILTER  01-17-13   inserted 2004- "abdomen"  . LIGAMENT REPAIR Right 1987   Rt. knee scope  . MAXIMUM ACCESS (MAS)POSTERIOR LUMBAR INTERBODY FUSION (PLIF) 2 LEVEL N/A 06/07/2014   Procedure: L4-5 L5-S1 FOR MAXIMUM ACCESS (MAS) POSTERIOR LUMBAR INTERBODY FUSION ;  Surgeon: Erline Levine, MD;  Location: [place] [facility] [facility];  Service: Neurosurgery;  Laterality: N/A;  L4-5 L5-S1 FOR MAXIMUM ACCESS (MAS) POSTERIOR LUMBAR INTERBODY FUSION   . TONSILLECTOMY     as child    Family History  Problem Relation Age of Onset  . Diabetes Mother   . Heart disease Father   . Hypertension Father   . Prostate  cancer Father   . Kidney disease Brother   . Colon cancer Neg Hx   . Colon polyps Neg Hx   . Stomach cancer Neg Hx   . Esophageal cancer Neg Hx   . Pancreatic cancer Neg Hx   . Liver disease Neg Hx     SOCIAL HISTORY: Social History   Socioeconomic History  . Marital status: Widowed    Spouse name: Not on file  . Number of children: 0  . Years of education: college  . Highest education level: Not on file  Occupational History  . Occupation: retired  Scientific laboratory technician  . Financial resource strain: Not on file  . Food insecurity    Worry: Not on file    Inability: Not on file  . Transportation needs    Medical: Not on file    Non-medical: Not on file  Tobacco Use  . Smoking status: Former Smoker    Packs/day: 0.50    Years: 10.00    Pack years: 5.00    Types: Cigarettes    Quit date: 04/19/2009    Years since quitting: 9.6  . Smokeless tobacco: Never Used  Substance and Sexual Activity  . Alcohol use: Yes    Alcohol/week: 0.0 standard drinks    Comment: occ. social- wine-1 drink monthly  . Drug use: No  . Sexual activity: Not Currently  Lifestyle  . Physical activity    Days per week: Not on file    Minutes per session: Not on file  . Stress: Not on file  Relationships  . Social Herbalist on phone: Not on file    Gets together: Not on file    Attends religious service: Not on file    Active member of club or organization: Not on file    Attends meetings of clubs or organizations: Not on file    Relationship status: Not on file  . Intimate partner violence    Fear of current or ex partner: Not on file    Emotionally abused: Not on file    Physically abused: Not on file    Forced sexual activity: Not on file  Other Topics Concern  . Not on file  Social History Narrative  . Not on file    Allergies  Allergen Reactions  . Carafate [Sucralfate] Hives    Other reaction(s): Angioedema (ALLERGY/intolerance) hives scratching  . Sulfonamide  Derivatives Rash    REACTION: Syncope    Current Outpatient Medications  Medication Sig Dispense Refill  . ACCU-CHEK SOFTCLIX LANCETS lancets CHECK BLOOD SUGARS TWICE DAILY 200 each 2  . albuterol (PROVENTIL HFA;VENTOLIN HFA) 108 (90 Base) MCG/ACT inhaler Inhale 1-2 puffs into the lungs every 6 (six) hours as needed for  wheezing or shortness of breath. 1 Inhaler 0  . Alcohol Swabs (B-D SINGLE USE SWABS REGULAR) PADS USE TO WIPE AREA TO CHECK BLOOD SUGAR DAILY  100 each 2  . amLODipine (NORVASC) 10 MG tablet Take 1 tablet (10 mg total) by mouth daily. 90 tablet 1  . BLACK COHOSH PO Take 1 tablet by mouth daily.    . Blood Glucose Calibration (ACCU-CHEK AVIVA) SOLN Use as directed 3 each 3  . Blood Glucose Monitoring Suppl (ACCU-CHEK AVIVA PLUS) w/Device KIT USE AS DIRECTED  TO CHECK BLOOD SUGAR EVERY DAY 1 kit 0  . DULoxetine (CYMBALTA) 30 MG capsule Take 1-2 capsules (30-60 mg total) by mouth daily. 180 capsule 1  . furosemide (LASIX) 40 MG tablet TAKE 1 TABLET DAILY AS NEEDED FOR EDEMA 90 tablet 3  . glucose blood (ACCU-CHEK AVIVA PLUS) test strip 1 each by Other route 2 (two) times daily. Use to check blood sugars twice a day 200 each 2  . Multiple Vitamins-Minerals (MULTIVITAMINS THER. W/MINERALS) TABS Take 1 tablet by mouth daily.     . potassium chloride SA (K-DUR) 20 MEQ tablet TAKE 1 TABLET EVERY DAY 90 tablet 0  . pregabalin (LYRICA) 300 MG capsule Take 1 capsule (300 mg total) by mouth 2 (two) times daily. 60 capsule 5  . rivaroxaban (XARELTO) 20 MG TABS tablet Take 1 tablet (20 mg total) by mouth daily with supper. 90 tablet 1  . traMADol (ULTRAM) 50 MG tablet TAKE 1 TABLET(50 MG) BY MOUTH FOUR TIMES DAILY 120 tablet 5  . mupirocin ointment (BACTROBAN) 2 % Place 1 application into the nose 2 (two) times daily. 22 g 0   No current facility-administered medications for this visit.     REVIEW OF SYSTEMS:  '[X]'$  denotes positive finding, '[ ]'$  denotes negative finding Cardiac  Comments:   Chest pain or chest pressure:    Shortness of breath upon exertion:    Short of breath when lying flat:    Irregular heart rhythm:        Vascular    Pain in calf, thigh, or hip brought on by ambulation:    Pain in feet at night that wakes you up from your sleep:     Blood clot in your veins:    Leg swelling:  x Bilateral      Pulmonary    Oxygen at home:    Productive cough:     Wheezing:         Neurologic    Sudden weakness in arms or legs:     Sudden numbness in arms or legs:     Sudden onset of difficulty speaking or slurred speech:    Temporary loss of vision in one eye:     Problems with dizziness:         Gastrointestinal    Blood in stool:     Vomited blood:         Genitourinary    Burning when urinating:     Blood in urine:        Psychiatric    Major depression:         Hematologic    Bleeding problems:    Problems with blood clotting too easily:        Skin    Rashes or ulcers:        Constitutional    Fever or chills:      PHYSICAL EXAM: Vitals:   12/05/18 1334  BP: 140/81  Pulse: 76  Resp: 12  Temp: 97.8 F (36.6 C)  TempSrc: Temporal  SpO2: 98%  Weight: 262 lb 6.4 oz (119 kg)  Height: '5\' 7"'$  (1.702 m)    GENERAL: The patient is a well-nourished female, in no acute distress. The vital signs are documented above. CARDIAC: There is a regular rate and rhythm.  VASCULAR:  1+ palpable PT pulses bilaterally Some skin pigmentation to bilateral calves medially PULMONARY: There is good air exchange bilaterally without wheezing or rales. ABDOMEN: Soft and non-tender with normal pitched bowel sounds.  MUSCULOSKELETAL: There are no major deformities or cyanosis. NEUROLOGIC: No focal weakness or paresthesias are detected. SKIN: There are no ulcers or rashes noted. PSYCHIATRIC: The patient has a normal affect.  DATA:   I independently reviewed left leg venous duplex and there is no evidence of DVT in the femoral and popliteal segment.  The  tibial veins were not well visualized.  Assessment/Plan:  63 year old female referred for evaluation of left leg swelling with history of remote left tibial DVT and now what the patient reports is bilateral lower extremity swelling.  I did review her left leg duplex done in clinic today and there is no evidence of DVT, although her tibial veins were not visualized.  Certainly could be some component of lower extremity venous reflux and venous insufficiency.  Discussed that we would need to get a venous reflux study to evaluate that and I will contact her with the results.  In the meantime I did recommend leg elevation and other conservative treatment.   Marty Heck, MD [facility] Office: [phone] Pager: [phone]

## 2018-12-27 ENCOUNTER — Other Ambulatory Visit: Payer: Self-pay | Admitting: Internal Medicine

## 2019-01-02 ENCOUNTER — Ambulatory Visit (INDEPENDENT_AMBULATORY_CARE_PROVIDER_SITE_OTHER): Payer: Medicare HMO | Admitting: Internal Medicine

## 2019-01-02 ENCOUNTER — Other Ambulatory Visit (INDEPENDENT_AMBULATORY_CARE_PROVIDER_SITE_OTHER): Payer: Medicare HMO

## 2019-01-02 ENCOUNTER — Other Ambulatory Visit: Payer: Self-pay

## 2019-01-02 ENCOUNTER — Encounter: Payer: Self-pay | Admitting: Internal Medicine

## 2019-01-02 VITALS — BP 138/80 | HR 78 | Temp 98.4°F | Ht 67.0 in | Wt 271.0 lb

## 2019-01-02 DIAGNOSIS — Z8639 Personal history of other endocrine, nutritional and metabolic disease: Secondary | ICD-10-CM | POA: Diagnosis not present

## 2019-01-02 DIAGNOSIS — R0789 Other chest pain: Secondary | ICD-10-CM | POA: Diagnosis not present

## 2019-01-02 DIAGNOSIS — Z23 Encounter for immunization: Secondary | ICD-10-CM

## 2019-01-02 DIAGNOSIS — Z0000 Encounter for general adult medical examination without abnormal findings: Secondary | ICD-10-CM | POA: Diagnosis not present

## 2019-01-02 DIAGNOSIS — M7989 Other specified soft tissue disorders: Secondary | ICD-10-CM | POA: Diagnosis not present

## 2019-01-02 DIAGNOSIS — Z9884 Bariatric surgery status: Secondary | ICD-10-CM

## 2019-01-02 DIAGNOSIS — Z6841 Body Mass Index (BMI) 40.0 and over, adult: Secondary | ICD-10-CM | POA: Diagnosis not present

## 2019-01-02 DIAGNOSIS — I10 Essential (primary) hypertension: Secondary | ICD-10-CM

## 2019-01-02 DIAGNOSIS — E538 Deficiency of other specified B group vitamins: Secondary | ICD-10-CM

## 2019-01-02 LAB — MICROALBUMIN / CREATININE URINE RATIO
Creatinine,U: 30.8 mg/dL
Microalb Creat Ratio: 2.3 mg/g (ref 0.0–30.0)
Microalb, Ur: <0.7 mg/dL (ref 0.0–1.9)

## 2019-01-02 LAB — COMPREHENSIVE METABOLIC PANEL
ALT: 52 U/L — ABNORMAL HIGH (ref 0–35)
AST: 30 U/L (ref 0–37)
Albumin: 3.8 g/dL (ref 3.5–5.2)
Alkaline Phosphatase: 264 U/L — ABNORMAL HIGH (ref 39–117)
BUN: 12 mg/dL (ref 6–23)
CO2: 24 mEq/L (ref 19–32)
Calcium: 8.5 mg/dL (ref 8.4–10.5)
Chloride: 111 mEq/L (ref 96–112)
Creatinine, Ser: 0.63 mg/dL (ref 0.40–1.20)
GFR: 114.67 mL/min (ref >60.00)
Glucose, Bld: 84 mg/dL (ref 70–99)
Potassium: 4.3 mEq/L (ref 3.5–5.1)
Sodium: 141 mEq/L (ref 135–145)
Total Bilirubin: 0.3 mg/dL (ref 0.2–1.2)
Total Protein: 6.5 g/dL (ref 6.0–8.3)

## 2019-01-02 LAB — TSH: TSH: 0.98 u[IU]/mL (ref 0.35–4.50)

## 2019-01-02 LAB — URINALYSIS, ROUTINE W REFLEX MICROSCOPIC
Bilirubin Urine: NEGATIVE
Hgb urine dipstick: NEGATIVE
Ketones, ur: NEGATIVE
Leukocytes,Ua: NEGATIVE
Nitrite: NEGATIVE
RBC / HPF: NONE SEEN (ref 0–?)
Specific Gravity, Urine: 1.01 (ref 1.000–1.030)
Total Protein, Urine: NEGATIVE
Urine Glucose: NEGATIVE
Urobilinogen, UA: 0.2 (ref 0.0–1.0)
WBC, UA: NONE SEEN (ref 0–?)
pH: 6.5 (ref 5.0–8.0)

## 2019-01-02 LAB — LIPID PANEL
Cholesterol: 146 mg/dL (ref 0–200)
HDL: 73.4 mg/dL (ref >39.00)
LDL Cholesterol: 60 mg/dL (ref 0–99)
NonHDL: 72.91
Total CHOL/HDL Ratio: 2
Triglycerides: 66 mg/dL (ref 0.0–149.0)
VLDL: 13.2 mg/dL (ref 0.0–40.0)

## 2019-01-02 LAB — CBC
HCT: 33.9 % — ABNORMAL LOW (ref 36.0–46.0)
Hemoglobin: 11 g/dL — ABNORMAL LOW (ref 12.0–15.0)
MCHC: 32.3 g/dL (ref 30.0–36.0)
MCV: 83.1 fl (ref 78.0–100.0)
Platelets: 176 10*3/uL (ref 150.0–400.0)
RBC: 4.08 Mil/uL (ref 3.87–5.11)
RDW: 17.6 % — ABNORMAL HIGH (ref 11.5–15.5)
WBC: 5.2 10*3/uL (ref 4.0–10.5)

## 2019-01-02 LAB — HEMOGLOBIN A1C: Hgb A1c MFr Bld: 5.5 % (ref 4.6–6.5)

## 2019-01-02 LAB — VITAMIN B12: Vitamin B-12: >1500 pg/mL — ABNORMAL HIGH (ref 211–911)

## 2019-01-02 LAB — BRAIN NATRIURETIC PEPTIDE: Pro B Natriuretic peptide (BNP): 176 pg/mL — ABNORMAL HIGH (ref 0.0–100.0)

## 2019-01-02 MED ORDER — CYANOCOBALAMIN 1000 MCG/ML IJ SOLN
1000.0000 ug | Freq: Once | INTRAMUSCULAR | Status: AC
  Administered 2019-01-02: 1000 ug via INTRAMUSCULAR

## 2019-01-02 NOTE — Progress Notes (Signed)
Subjective:   Patient ID: [name], female    DOB: 08/31/1955, 63 y.o.   MRN: [id number]  HPI Here for medicare wellness and physical, with new complaints. Please see A/P for status and treatment of chronic medical problems.   HPI #3: Here for ongoing concerns about left leg swelling (concern for DVT in the history of prior DVT, is currently on anticoagulation, asked vascular to help assess and she saw them but they did not do imaging which they told her that they would, still with swelling but going down since starting anticoagulation, no known trigger if DVT), and chest pains (intermittent, last about 5 minutes, random, she is worried about them, does have some SOB with walking, never stress testing, no cath), and stomach pain/bloating (prior gastric bypass, she is concerned that something could be wrong with this, she has had some changes in her bowels, denies blood in stool, still taking multivitamin, denies change in diet, weight up some which is not usual for her).   Diet: heart healthy Physical activity: sedentary Depression/mood screen: stable Hearing: intact to whispered voice Visual acuity: grossly normal, performs annual eye exam  ADLs: capable Fall risk: none Home safety: good Cognitive evaluation: intact to orientation, naming, recall and repetition EOL planning: adv directives discussed    Office Visit from 10/25/2018 in [place]  PHQ-2 Total Score  3        Office Visit from 10/25/2018 in [place]  PHQ-9 Total Score  9     I have personally reviewed and have noted 1. The patient's medical and social history - reviewed today no changes 2. Their use of alcohol, tobacco or illicit drugs 3. Their current medications and supplements 4. The patient's functional ability including ADL's, fall risks, home safety risks and hearing or visual impairment. 5. Diet and physical activities 6. Evidence for depression  or mood disorders 7. Care team reviewed and updated  Patient Care Team: Hoyt Koch, MD as PCP - General (Internal Medicine) Past Medical History:  Diagnosis Date  . Anemia    takes Ferrous Sulfate daily  . Anxiety    takes Citalopram daily  . Arthritis   . Asthma    2004-prior to gastric bypass and no problems since  . CHF (congestive heart failure) (HCC)    takes Lasix daily as needed  . Chronic back pain    spondylolisthesis/stenosis/radiculopathy  . Complication of anesthesia yrs ago   slow to wake up  . Depression   . Diabetes ([place])   . DVT (deep venous thrombosis) ([place]) 01-17-13   past hx. -tx.5-6 yrs ago bilateral legs, occ. sporadic swelling, has IVC filter implanted  . Dysrhythmia    "heart tends to flutter"  . Fibromyalgia   . Fracture    right foot and is in a cam boot  . Gallstones   . GERD (gastroesophageal reflux disease)    hx of-no meds now  . Heart murmur   . History of bronchitis 2012 or 2013  . History of colon polyps   . Hypertension    takes Metoprolol daily  . Insomnia    takes Melatonin daily  . Joint pain   . Joint swelling   . Pelvic floor dysfunction   . Peripheral neuropathy   . Pneumonia 90's   hx of  . S/P gastric bypass   . Sleep apnea    no cpap used in many yrs after weight lost-no machine now  .  Urinary frequency   . Urinary urgency   . Vitamin D deficiency    Past Surgical History:  Procedure Laterality Date  . BALLOON DILATION N/A 01/31/2013   Procedure: BALLOON DILATION;  Surgeon: Arta Silence, MD;  [facility];  Service: Endoscopy;  Laterality: N/A;  . CHOLECYSTECTOMY  2007  . COLONOSCOPY    . DILATION AND CURETTAGE OF UTERUS  yrs ago  . ESOPHAGOGASTRODUODENOSCOPY (EGD) WITH PROPOFOL  03/01/2012   Procedure: ESOPHAGOGASTRODUODENOSCOPY (EGD) WITH PROPOFOL;  Surgeon: Arta Silence, MD;  [facility];  Service: Endoscopy;  Laterality: N/A;  . ESOPHAGOGASTRODUODENOSCOPY (EGD) WITH PROPOFOL  N/A 01/31/2013   Procedure: ESOPHAGOGASTRODUODENOSCOPY (EGD) WITH PROPOFOL;  Surgeon: Arta Silence, MD;  [facility];  Service: Endoscopy;  Laterality: N/A;  . ESOPHAGOGASTRODUODENOSCOPY (EGD) WITH PROPOFOL N/A 09/02/2015   Procedure: ESOPHAGOGASTRODUODENOSCOPY (EGD) WITH PROPOFOL;  Surgeon: Gatha Mayer, MD;  [facility];  Service: Endoscopy;  Laterality: N/A;  . GASTRIC BY-PASS  2004  . HERNIA REPAIR  2005  . INSERTION OF VENA CAVA FILTER  01-17-13   inserted 2004- "abdomen"  . LIGAMENT REPAIR Right 1987   Rt. knee scope  . MAXIMUM ACCESS (MAS)POSTERIOR LUMBAR INTERBODY FUSION (PLIF) 2 LEVEL N/A 06/07/2014   Procedure: L4-5 L5-S1 FOR MAXIMUM ACCESS (MAS) POSTERIOR LUMBAR INTERBODY FUSION ;  Surgeon: Erline Levine, MD;  Location: [place] [facility];  Service: Neurosurgery;  Laterality: N/A;  L4-5 L5-S1 FOR MAXIMUM ACCESS (MAS) POSTERIOR LUMBAR INTERBODY FUSION   . TONSILLECTOMY     as child   Family History  Problem Relation Age of Onset  . Diabetes Mother   . Heart disease Father   . Hypertension Father   . Prostate cancer Father   . Kidney disease Brother   . Colon cancer Neg Hx   . Colon polyps Neg Hx   . Stomach cancer Neg Hx   . Esophageal cancer Neg Hx   . Pancreatic cancer Neg Hx   . Liver disease Neg Hx    Review of Systems  Constitutional: Positive for activity change and appetite change.  HENT: Negative.   Eyes: Negative.   Respiratory: Negative for cough, chest tightness and shortness of breath.   Cardiovascular: Positive for chest pain. Negative for palpitations and leg swelling.  Gastrointestinal: Positive for abdominal distention and abdominal pain. Negative for anal bleeding, blood in stool, constipation, diarrhea, nausea, rectal pain and vomiting.  Musculoskeletal: Positive for arthralgias and myalgias.  Skin: Negative.   Neurological: Negative.   Psychiatric/Behavioral: Negative.     Objective:  Physical Exam Constitutional:       Appearance: She is well-developed. She is obese.  HENT:     Head: Normocephalic and atraumatic.  Neck:     Musculoskeletal: Normal range of motion.  Cardiovascular:     Rate and Rhythm: Normal rate and regular rhythm.  Pulmonary:     Effort: Pulmonary effort is normal. No respiratory distress.     Breath sounds: Normal breath sounds. No wheezing or rales.  Abdominal:     General: Bowel sounds are normal. There is distension.     Palpations: Abdomen is soft.     Tenderness: There is abdominal tenderness. There is no rebound.  Musculoskeletal:        General: Tenderness present.     Left lower leg: Edema present.  Skin:    General: Skin is warm and dry.  Neurological:     Mental Status: She is alert and oriented to person, place, and time.  Coordination: Coordination normal.     Vitals:   01/02/19 1300  BP: 138/80  Pulse: 78  Temp: 98.4 F (36.9 C)  TempSrc: Oral  SpO2: 98%  Weight: 271 lb (122.9 kg)  Height: 5\' 7"  (1.702 m)   EKG: Rate 63, intervals normal, axis normal, sinus, no st or t wave changes, no change when compared to Feb 2020  Assessment & Plan:  Prevnar 13 given at visit

## 2019-01-02 NOTE — Patient Instructions (Signed)
The EKG looks the same today from last time.   We will get the labs today and a scan of the stomach if there are no problems.   I will contact the vascular doctor about the other scan of the leg. We will plan for 6 months of xarelto if we cannot decide if there is a blood clot in the leg which would end in January 2021.    Health Maintenance, Female Adopting a healthy lifestyle and getting preventive care are important in promoting health and wellness. Ask your health care provider about:  The right schedule for you to have regular tests and exams.  Things you can do on your own to prevent diseases and keep yourself healthy. What should I know about diet, weight, and exercise? Eat a healthy diet   Eat a diet that includes plenty of vegetables, fruits, low-fat dairy products, and lean protein.  Do not eat a lot of foods that are high in solid fats, added sugars, or sodium. Maintain a healthy weight Body mass index (BMI) is used to identify weight problems. It estimates body fat based on height and weight. Your health care provider can help determine your BMI and help you achieve or maintain a healthy weight. Get regular exercise Get regular exercise. This is one of the most important things you can do for your health. Most adults should:  Exercise for at least 150 minutes each week. The exercise should increase your heart rate and make you sweat (moderate-intensity exercise).  Do strengthening exercises at least twice a week. This is in addition to the moderate-intensity exercise.  Spend less time sitting. Even light physical activity can be beneficial. Watch cholesterol and blood lipids Have your blood tested for lipids and cholesterol at 65 years of age, then have this test every 5 years. Have your cholesterol levels checked more often if:  Your lipid or cholesterol levels are high.  You are older than 65 years of age.  You are at high risk for heart disease. What should I  know about cancer screening? Depending on your health history and family history, you may need to have cancer screening at various ages. This may include screening for:  Breast cancer.  Cervical cancer.  Colorectal cancer.  Skin cancer.  Lung cancer. What should I know about heart disease, diabetes, and high blood pressure? Blood pressure and heart disease  High blood pressure causes heart disease and increases the risk of stroke. This is more likely to develop in people who have high blood pressure readings, are of African descent, or are overweight.  Have your blood pressure checked: ? Every 3-5 years if you are 41-40 years of age. ? Every year if you are 13 years old or older. Diabetes Have regular diabetes screenings. This checks your fasting blood sugar level. Have the screening done:  Once every three years after age 45 if you are at a normal weight and have a low risk for diabetes.  More often and at a younger age if you are overweight or have a high risk for diabetes. What should I know about preventing infection? Hepatitis B If you have a higher risk for hepatitis B, you should be screened for this virus. Talk with your health care provider to find out if you are at risk for hepatitis B infection. Hepatitis C Testing is recommended for:  Everyone born from [birth year] through 1967.  Anyone with known risk factors for hepatitis C. Sexually transmitted infections (STIs)  Get screened  for STIs, including gonorrhea and chlamydia, if: ? You are sexually active and are younger than 65 years of age. ? You are older than 65 years of age and your health care provider tells you that you are at risk for this type of infection. ? Your sexual activity has changed since you were last screened, and you are at increased risk for chlamydia or gonorrhea. Ask your health care provider if you are at risk.  Ask your health care provider about whether you are at high risk for HIV. Your health  care provider may recommend a prescription medicine to help prevent HIV infection. If you choose to take medicine to prevent HIV, you should first get tested for HIV. You should then be tested every 3 months for as long as you are taking the medicine. Pregnancy  If you are about to stop having your period (premenopausal) and you may become pregnant, seek counseling before you get pregnant.  Take 400 to 800 micrograms (mcg) of folic acid every day if you become pregnant.  Ask for birth control (contraception) if you want to prevent pregnancy. Osteoporosis and menopause Osteoporosis is a disease in which the bones lose minerals and strength with aging. This can result in bone fractures. If you are 63 years old or older, or if you are at risk for osteoporosis and fractures, ask your health care provider if you should:  Be screened for bone loss.  Take a calcium or vitamin D supplement to lower your risk of fractures.  Be given hormone replacement therapy (HRT) to treat symptoms of menopause. Follow these instructions at home: Lifestyle  Do not use any products that contain nicotine or tobacco, such as cigarettes, e-cigarettes, and chewing tobacco. If you need help quitting, ask your health care provider.  Do not use street drugs.  Do not share needles.  Ask your health care provider for help if you need support or information about quitting drugs. Alcohol use  Do not drink alcohol if: ? Your health care provider tells you not to drink. ? You are pregnant, may be pregnant, or are planning to become pregnant.  If you drink alcohol: ? Limit how much you use to 0-1 drink a day. ? Limit intake if you are breastfeeding.  Be aware of how much alcohol is in your drink. In the [place] one drink equals one 12 oz bottle of beer (355 mL), one 5 oz glass of wine (148 mL), or one 1 oz glass of hard liquor (44 mL). General instructions  Schedule regular health, dental, and eye exams.  Stay  current with your vaccines.  Tell your health care provider if: ? You often feel depressed. ? You have ever been abused or do not feel safe at home. Summary  Adopting a healthy lifestyle and getting preventive care are important in promoting health and wellness.  Follow your health care provider's instructions about healthy diet, exercising, and getting tested or screened for diseases.  Follow your health care provider's instructions on monitoring your cholesterol and blood pressure. This information is not intended to replace advice given to you by your health care provider. Make sure you discuss any questions you have with your health care provider. Document Released: 10/19/2010 Document Revised: 03/29/2018 Document Reviewed: 03/29/2018 Elsevier Patient Education  2020 [employer].

## 2019-01-04 NOTE — Assessment & Plan Note (Signed)
Given new abdominal pain will check labs and if no etiology may need CT abdomen/pelvis to assess pain and bloating recent.

## 2019-01-04 NOTE — Assessment & Plan Note (Signed)
BP at goal on her lasix (prn only).

## 2019-01-04 NOTE — Assessment & Plan Note (Signed)
Flu shot up to date. Pneumonia given prevnar 13 today. Shingrix counseled. Tetanus due 2026. Colonoscopy due 2021. Mammogram due 2022, pap smear aged out and dexa due, declines due to pandemic. Counseled about sun safety and mole surveillance. Counseled about the dangers of distracted driving. Given 10 year screening recommendations.

## 2019-01-04 NOTE — Assessment & Plan Note (Signed)
EKG done in office without changes. Given her symptoms and co-morbidities needs cardiology referral for assessment.

## 2019-01-04 NOTE — Assessment & Plan Note (Signed)
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 Will reach out to vascular to see if they think getting the reflux venous study will be informative (recommended per last note of theirs but not done).

## 2019-01-04 NOTE — Assessment & Plan Note (Signed)
Weight is increasing recently. S/P gastric bypass.

## 2019-01-04 NOTE — Assessment & Plan Note (Signed)
Checking HgA1c and adjust as needed. She was cured with gastric bypass surgery.

## 2019-01-10 ENCOUNTER — Other Ambulatory Visit: Payer: Self-pay

## 2019-01-10 DIAGNOSIS — M7989 Other specified soft tissue disorders: Secondary | ICD-10-CM

## 2019-01-11 ENCOUNTER — Other Ambulatory Visit: Payer: Self-pay

## 2019-01-11 ENCOUNTER — Ambulatory Visit (HOSPITAL_COMMUNITY)
Admission: RE | Admit: 2019-01-11 | Discharge: 2019-01-11 | Disposition: A | Discharge status: Home / Self Care | Payer: Medicare HMO | Source: Ambulatory Visit | Attending: Family | Admitting: Family

## 2019-01-11 DIAGNOSIS — M7989 Other specified soft tissue disorders: Secondary | ICD-10-CM | POA: Insufficient documentation

## 2019-01-16 ENCOUNTER — Telehealth: Payer: Self-pay | Admitting: Vascular Surgery

## 2019-01-16 NOTE — Telephone Encounter (Signed)
I left a message with [name] regarding her left leg venous reflux study.  I discussed that most of her reflux appears to be in the deep system in the common femoral vein.  She only has a very focal area of superficial reflux and we typically only offer intervention for longer segments in the superficial system.  I think she would best be managed with conservative therapy at this time and recommended to her thigh-high compression with 20 to 30 mmHg to see if that helps some of her symptoms with her leg swelling.  Happy to follow-up with her accordingly and answer any questions.  Marty Heck, MD [facility] Office: [phone] Pager: [place]

## 2019-01-17 ENCOUNTER — Other Ambulatory Visit: Payer: Self-pay | Admitting: *Deleted

## 2019-01-17 NOTE — Telephone Encounter (Signed)
[place] Controlled Database Checked Last filled: 12/22/18 # 60 LOV w/you: 01/02/19 Next appt w/you: None

## 2019-01-18 ENCOUNTER — Telehealth: Payer: Self-pay | Admitting: Internal Medicine

## 2019-01-18 MED ORDER — PREGABALIN 300 MG PO CAPS: 300.0000 mg | ORAL_CAPSULE | Freq: Two times a day (BID) | ORAL | 5 refills | Status: DC

## 2019-01-18 NOTE — Telephone Encounter (Signed)
Request for records was sent to [facility] for colonoscopy results. We received a fax from them stating there are no records for the dates specified on the request.

## 2019-01-19 ENCOUNTER — Encounter: Payer: Self-pay | Admitting: Internal Medicine

## 2019-01-19 ENCOUNTER — Ambulatory Visit (INDEPENDENT_AMBULATORY_CARE_PROVIDER_SITE_OTHER): Payer: Medicare HMO | Admitting: Internal Medicine

## 2019-01-19 DIAGNOSIS — Z20822 Contact with and (suspected) exposure to covid-19: Secondary | ICD-10-CM | POA: Insufficient documentation

## 2019-01-19 DIAGNOSIS — Z20828 Contact with and (suspected) exposure to other viral communicable diseases: Secondary | ICD-10-CM

## 2019-01-19 MED ORDER — DOXYCYCLINE HYCLATE 100 MG PO TABS: 100.0000 mg | ORAL_TABLET | Freq: Two times a day (BID) | ORAL | 0 refills | Status: DC

## 2019-01-19 NOTE — Progress Notes (Signed)
Virtual Visit via Video Note  I connected with [name] on 01/19/19 at  3:20 PM EDT by a video enabled telemedicine application and verified that I am speaking with the correct person using two identifiers.  The patient and the provider were at separate locations throughout the entire encounter.   I discussed the limitations of evaluation and management by telemedicine and the availability of in person appointments. The patient expressed understanding and agreed to proceed.  History of Present Illness: The patient is a 63 y.o. female with visit for feeling sick. Started Tuesday afternoon. Has sore throat and cough and some neck pain. She denies fevers or chills. Denies muscle aches. Overall it is not improving. Has tried otc cough medicine without relief  Observations/Objective: Appearance: appears ill compared to normal, breathing appears normal, coughing during visit, casual grooming, abdomen does not appear distended, throat not well visualized due to camera quality, memory normal, mental status is A and O times 3  Assessment and Plan: See problem oriented charting  Follow Up Instructions: doxycycline to cover for pneumonia and covid-19 testing  Visit time 25 minutes: greater than 50% of that time was spent in face to face counseling and coordination of care with the patient: counseled about potential treatment if covid-19 + or - as well as the plan to cover for bacterial infection while testing and quarantining until results return regardless of symptoms.   I discussed the assessment and treatment plan with the patient. The patient was provided an opportunity to ask questions and all were answered. The patient agreed with the plan and demonstrated an understanding of the instructions.   The patient was advised to call back or seek an in-person evaluation if the symptoms worsen or if the condition fails to improve as anticipated.  Hoyt Koch, MD

## 2019-01-19 NOTE — Assessment & Plan Note (Signed)
Testing site is closed by time of our visit until Monday. Will cover with doxycycline until able to get covid-19 testing given severity of illness. Advised to quarantine, testing procedures, when to end quarantine if positive. As well as need for further treatment if worsening prior to test results of covid-19.

## 2019-01-25 NOTE — Telephone Encounter (Signed)
True when it is close to closing time and they have a line. [place] was backed up, causing traffic issues. They closed a little early in order to test everyone in the line at that time, this would not apply to [facility] or PAT.

## 2019-01-29 DIAGNOSIS — Z20828 Contact with and (suspected) exposure to other viral communicable diseases: Secondary | ICD-10-CM | POA: Diagnosis not present

## 2019-02-07 ENCOUNTER — Telehealth: Payer: Medicare HMO

## 2019-03-12 ENCOUNTER — Telehealth: Payer: Self-pay | Admitting: *Deleted

## 2019-03-12 NOTE — Telephone Encounter (Signed)
Called left message to call back -  Needed to  Discuss any covid symptoms prior to office visit 03/13/19

## 2019-03-13 ENCOUNTER — Ambulatory Visit (INDEPENDENT_AMBULATORY_CARE_PROVIDER_SITE_OTHER): Payer: Medicare HMO | Admitting: Cardiology

## 2019-03-13 ENCOUNTER — Other Ambulatory Visit: Payer: Self-pay

## 2019-03-13 ENCOUNTER — Encounter: Payer: Self-pay | Admitting: Cardiology

## 2019-03-13 VITALS — BP 120/66 | HR 69 | Temp 97.0°F | Ht 67.0 in | Wt 267.0 lb

## 2019-03-13 DIAGNOSIS — R0600 Dyspnea, unspecified: Secondary | ICD-10-CM | POA: Diagnosis not present

## 2019-03-13 DIAGNOSIS — I10 Essential (primary) hypertension: Secondary | ICD-10-CM | POA: Diagnosis not present

## 2019-03-13 DIAGNOSIS — R000 Tachycardia, unspecified: Secondary | ICD-10-CM | POA: Diagnosis not present

## 2019-03-13 DIAGNOSIS — R011 Cardiac murmur, unspecified: Secondary | ICD-10-CM | POA: Insufficient documentation

## 2019-03-13 DIAGNOSIS — G4733 Obstructive sleep apnea (adult) (pediatric): Secondary | ICD-10-CM

## 2019-03-13 DIAGNOSIS — R0609 Other forms of dyspnea: Secondary | ICD-10-CM | POA: Insufficient documentation

## 2019-03-13 DIAGNOSIS — Z9884 Bariatric surgery status: Secondary | ICD-10-CM | POA: Diagnosis not present

## 2019-03-13 DIAGNOSIS — R079 Chest pain, unspecified: Secondary | ICD-10-CM | POA: Diagnosis not present

## 2019-03-13 DIAGNOSIS — R0602 Shortness of breath: Secondary | ICD-10-CM

## 2019-03-13 DIAGNOSIS — Z86718 Personal history of other venous thrombosis and embolism: Secondary | ICD-10-CM

## 2019-03-13 DIAGNOSIS — Z6841 Body Mass Index (BMI) 40.0 and over, adult: Secondary | ICD-10-CM | POA: Diagnosis not present

## 2019-03-13 MED ORDER — METOPROLOL TARTRATE 50 MG PO TABS: 100.0000 mg | ORAL_TABLET | Freq: Once | ORAL | 0 refills | Status: DC

## 2019-03-13 NOTE — Patient Instructions (Addendum)
Medication Instructions:   CHANGE  LASIX TO  TAKING 3 DAYS A WEEK   *If you need a refill on your cardiac medications before your next appointment, please call your pharmacy*  Lab Work: SEE INSTRUCTIONS SHEET     Testing/Procedures: 1- WILL BE SCHEDULE AT  [place] [facility] Your physician has requested that you have an echocardiogram. Echocardiography is a painless test that uses sound waves to create images of your heart. It provides your doctor with information about the size and shape of your heart and how well your heart's chambers and valves are working. This procedure takes approximately one hour. There are no restrictions for this procedure. 2- WILL BE SCHEDULE AT [facility] - [place]  Your physician has requested that you have cardiac CTA. Cardiac computed tomography (CT) is a painless test that uses an x-ray machine to take clear, detailed pictures of your heart. For further information please visit [email or web address]. Please follow instruction sheet as given.  3- .Your physician has recommended that you wear a  14  DAY ZIO-PATCH monitor. The Zio patch cardiac monitor continuously records heart rhythm data for up to 14 days, this is for patients being evaluated for multiple types heart rhythms. For the first 24 hours post application, please avoid getting the Zio monitor wet in the shower or by excessive sweating during exercise. After that, feel free to carry on with regular activities. Keep soaps and lotions away from the ZIO XT Patch.  This will be mailed to you, please expect 7-10 days to receive.  placed at our [facility] location - [address].        Follow-Up: At [facility], you and your health needs are our priority.  As part of our continuing mission to provide you with exceptional heart care, we have created designated Provider Care Teams.  These Care Teams include your primary Cardiologist  (physician) and Advanced Practice Providers (APPs -  Physician Assistants and Nurse Practitioners) who all work together to provide you with the care you need, when you need it.  Your next appointment:   2 month(s)  The format for your next appointment:   In Person  Provider:   Glenetta Hew, MD  Other Instructions   Your cardiac CT will be scheduled at one of the below locations:   [facility] [address] [place], [place] [address] [phone]   If scheduled at [facility], please arrive at the [facility] main entrance of [facility] 30-45 minutes prior to test start time. Proceed to the [facility] Radiology Department (first floor) to check-in and test prep.    Please follow these instructions carefully (unless otherwise directed):    On the Night Before the Test: . Be sure to Drink plenty of water. . Do not consume any caffeinated/decaffeinated beverages or chocolate 12 hours prior to your test. . Do not take any antihistamines 12 hours prior to your test.   On the Day of the Test: . Drink plenty of water. Do not drink any water within one hour of the test. . Do not eat any food 4 hours prior to the test. . You may take your regular medications prior to the test.  . Take metoprolol (Lopressor) 100 MG  two hours prior to test. . HOLD Furosemide morning of the test. . FEMALES- please wear underwire-free bra if available  After the Test: . Drink plenty of water. . After receiving IV contrast, you may experience a mild flushed feeling. This is normal. . On occasion, you may experience a mild rash up to 24 hours after the test. This is not dangerous. If this occurs, you can take Benadryl 25 mg and increase your fluid intake. . If you experience trouble breathing, this can be serious. If it is severe call 911 IMMEDIATELY. If it is mild, please call our office. .    Once we have confirmed authorization from your insurance  company, we will call you to set up a date and time for your test.   For non-scheduling related questions, please contact the cardiac imaging nurse navigator should you have any questions/concerns: Marchia Bond, RN Navigator Cardiac Imaging [facility] [phone] Office    [place] Monitor Instructions   Your physician has requested you wear your ZIO patch monitor___14____days.   This is a single patch monitor.  [employer] supplies one patch monitor per enrollment.  Additional stickers are not available.   Please do not apply patch if you will be having a Nuclear Stress Test, Echocardiogram, Cardiac CT, MRI, or Chest Xray during the time frame you would be wearing the monitor. The patch cannot be worn during these tests.  You cannot remove and re-apply the ZIO XT patch monitor.   Your ZIO patch monitor will be sent USPS Priority mail from [facility] directly to your home address. The monitor may also be mailed to a PO BOX if home delivery is not available.   It may take 3-5 days to receive your monitor after you have been enrolled.   Once you have received you monitor, please review enclosed instructions.  Your monitor has already been registered assigning a specific monitor serial # to you.   Applying the monitor   Shave hair from upper left chest.   Hold abrader disc by orange tab.  Rub abrader in 40 strokes over left upper chest as indicated in your monitor instructions.   Clean area with 4 enclosed alcohol pads .  Use all pads to assure are is cleaned thoroughly.  Let dry.   Apply patch as indicated in monitor instructions.  Patch will be place under collarbone on left side of chest with arrow pointing upward.   Rub patch adhesive wings for 2 minutes.Remove white label marked "1".  Remove white label marked "2".  Rub patch adhesive wings for 2 additional minutes.   While looking in a mirror, press and release button in center of patch.   A small green light will flash 3-4 times .  This will be your only indicator the monitor has been turned on.     Do not shower for the first 24 hours.  You may shower after the first 24 hours.   Press button if you feel a symptom. You will hear a small click.  Record Date, Time and Symptom in the Patient Log Book.   When you are ready to remove patch, follow instructions on last 2 pages of Patient Log Book.  Stick patch monitor onto last page of Patient Log Book.   Place Patient Log Book in [place] box.  Use locking tab on box and tape box closed securely.  The [employer] has [employer] on it.  Please place in mailbox as soon as possible.  Your physician should have your test results approximately 7 days after the  monitor has been mailed back to [place].   Call [facility] at [phone] if you have questions regarding your ZIO XT patch monitor.  Call them immediately if you see an orange light blinking on your monitor.   If your monitor falls off in less than 4 days contact our Monitor department at [phone].  If your monitor becomes loose or falls off after 4 days call [employer] at [phone] for suggestions on securing your monitor.

## 2019-03-13 NOTE — Progress Notes (Signed)
Primary Care Provider: Hoyt Koch, MD Cardiologist: No primary care provider on file. Electrophysiologist:   Clinic Note: Chief Complaint  Patient presents with  . New Patient (Initial Visit)  . Headache  . Shortness of Breath  . Chest Pain  . Edema    Legs.    HPI:    [name] is a 63 y.o. female who is being seen today for the evaluation of [place] at the request of Hoyt Koch, *.  [name] was seen in July by Dr. Sharlet Salina for complaints of leg swelling and pain.  She was started on Xarelto for possible DVT, and referred to vascular surgery.  She was seen by Dr. Carlis Abbott from Vascular Surgery with concerns of left leg swelling.  She is apparently had a history of DVT in that leg (in the past noted bilateral status post IVC filter in 2009 -> no longer on anticoagulation).  Dr. Carlis Abbott noted that she was complaining of pain pain and swelling in both legs now for several months the pain is burning along the medial thigh and calf.  She has been treated for cellulitis with no improvement.  Also noted to have been treated with "fluid pills "for "documented CHF "however there is been no improvement.  --> Dr. Carlis Abbott reviewed the venous Dopplers that have been done and confirmed that there was no evidence of a DVT on the left side.  She was then referred for evaluation for venous reflux which revealed mostly deep venous reflux in the common femoral vein with only a focal superficial area.  Not really amenable for treatment.    Recommendation was conservative therapy with thigh-high compression stockings 20-30 mmHg  Recent Hospitalizations / Urgent Visits  Was evaluated on October 2 by Dr. Sharlet Salina via telemedicine for possible pneumonia and was referred for Covid testing.--Ruled out.  Noted feeling sick, sore throat, cough neck pain.  Feeling poorly.  SARS-CoV-2 -not detected/negative   Reviewed  CV studies:    The  following studies were reviewed today: (if available, images/films reviewed: From Epic Chart or Care Everywhere) . Venous Dopplers for both DVT and reflux reviewed. . January 10, 2019: Abnormal reflux noted in the common femoral vein and short segment of the greater saphenous vein at the proximal thigh noted on the left.  No thrombus noted.   I reviewed from February 2015: Nonischemic with normal EF.  Interval History:   [name] presents here today with a plethora of complaints.  She indicates to me that she had gastric bypass surgery in 2003 and was over 400 pounds.  At that time she had was on CPAP for OSA and was very deconditioned.  She lost a significant matter weight and was able to come off the CPAP.  And is doing well.  She also reports that may be in 2009 she had bilateral lower extremity DVTs and had an IVC filter placed.  Do not have any details on this. At baseline, she is ambulatory, but walks with a cane.  Also sometimes has to use a walker for longer distances due to leg pain.  She takes Lyrica for neuropathic pain  Tells me that she previously was able to walk her dog around the block, and now she is not able to go even across the street without getting short of breath and would have some chest tightness.  She tells me that over the last year or so her weight has started to  go back up again in the past year she has gained maybe 20 pounds despite not "eating a whole lot ".  She has a diagnosis of CHF although I do not see any documentation of a cardiac evaluation besides a Myoview in 2015 which showed normal EF and no ischemia.  She says that she sleeps on 2 pillows and has off and on symptoms of waking up short of breath.  She tells me that she has had left greater than right leg swelling for the last several months and has been treated with diuretics and antibiotics etc.  She tells me now that she takes Lasix maybe once or twice a month. Reviewing her chart she has had 3  venous Dopplers done mostly noting deep vein reflux as indicated by Dr. Carlis Abbott.  She is currently wearing compression stockings.  In addition to the swelling and some mild PND symptoms, she says that off-and-on she will have episodes of chest pressure and tightness.  This can occur with just sitting, but can also occur with activity.  That may make it worse.  She clearly has exertional dyspnea now with doing most activities.  She has had spells where she feels her heart rate going fast.  Usually that occurs at night can happen 2-3 times a week maybe last about 5 minute.  While these make her feel a little bit unusual and have some discomfort in her chest she does not have any syncope.  She may get dizzy and lightheaded, near syncope but no passout spells.  No TIA or amaurosis fugax.  CV Review of Symptoms (Summary) Cardiovascular ROS: positive for - chest pain, dyspnea on exertion, edema, irregular heartbeat, orthopnea, palpitations, paroxysmal nocturnal dyspnea, rapid heart rate, shortness of breath and Noted above negative for - loss of consciousness or TIA since amaurosis fugax.  Does not really walk enough to note claudication  The patient does not have symptoms concerning for COVID-19 infection (fever, chills, cough, or new shortness of breath).  She has many of the symptoms, but not in conjunction together, she is also recently ruled out for Covid The patient is practicing social distancing. ++ Masking.  Does not usually go out for groceries/shopping.    REVIEWED OF SYSTEMS   A comprehensive ROS was performed. Review of Systems  Constitutional: Positive for malaise/fatigue. Negative for weight loss (Weight gain).       No recent fevers or chills.  HENT: Negative for congestion and nosebleeds.   Respiratory: Positive for shortness of breath. Negative for cough and wheezing.   Gastrointestinal: Negative for blood in stool and melena.  Genitourinary: Negative for hematuria.  Musculoskeletal:  Positive for back pain and joint pain. Negative for falls.  Neurological: Positive for dizziness and focal weakness (Left leg and arm). Negative for headaches.  Endo/Heme/Allergies: Negative for environmental allergies.  Psychiatric/Behavioral: Negative for memory loss. The patient is nervous/anxious and has insomnia (Not sleeping well.).   All other systems reviewed and are negative.  No flowsheet data found.   03/13/2019 2:00 PM  How likely are you to doze off or fall asleep in the following situations?   Sitting and reading? High chance of dozing - 3  Watching TV? High chance of dozing - 3  Sitting, inactive in a public place (e.g. a theatre or a meeting)? Moderate chance of dozing -2  As a passenger in a car for an hour without a break? High chance of dozing -3  Lying down to rest in the afternoon when circumstances  permit? High chance of dozing - 3  Sitting and talking to someone? Slight chance of dozing - 1  Sitting quietly after a lunch without alcohol? High chance of dozing - 3  In a car, while stopped for a few minutes in traffic?  Moderate chance of dozing - 2  Epworth Total Score 20  Document the patient's comorbid illnesses and symptoms of sleep apnea   The patient has the following comorbid illnesses: - Not diagnosed  The patient has the following symptoms of sleep apnea: Snoring; Daytime Sleepiness; Un-refreshing Sleep; Morning Headaches; Nocturia; Decreased Sexual Desire    I have reviewed and (if needed) personally updated the patient's problem list, medications, allergies, past medical and surgical history, social and family history.   PAST MEDICAL HISTORY   Past Medical History:  Diagnosis Date  . Anemia    takes Ferrous Sulfate daily  . Anxiety    takes Citalopram daily  . Arthritis   . Asthma    2004-prior to gastric bypass and no problems since  . CHF (congestive heart failure) (HCC)    takes Lasix daily as needed  . Chronic back pain     spondylolisthesis/stenosis/radiculopathy  . Complication of anesthesia yrs ago   slow to wake up  . Depression   . Diabetes ([place])   . DVT (deep venous thrombosis) ([place]) 01/17/2013   past hx. -tx.5-6 yrs ago bilateral legs, occ. sporadic swelling, has IVC filter implanted  . Dysrhythmia    "heart tends to flutter"  . Fibromyalgia   . Fracture    right foot and is in a cam boot  . Gallstones   . GERD (gastroesophageal reflux disease)    hx of-no meds now  . Heart murmur   . History of bronchitis 2012 or 2013  . History of colon polyps   . Hypertension    takes Metoprolol daily  . Insomnia    takes Melatonin daily  . Pelvic floor dysfunction   . Peripheral neuropathy   . Pneumonia 90's   hx of  . S/P gastric bypass 2003  . Sleep apnea    no cpap used in many yrs after weight lost-no machine now  . Urinary frequency   . Urinary urgency   . Vitamin D deficiency      PAST SURGICAL HISTORY   Past Surgical History:  Procedure Laterality Date  . BALLOON DILATION N/A 01/31/2013   Procedure: BALLOON DILATION;  Surgeon: Arta Silence, MD;  Location: [facility];  Service: Endoscopy;  Laterality: N/A;  . CHOLECYSTECTOMY  2007  . COLONOSCOPY    . DILATION AND CURETTAGE OF UTERUS  yrs ago  . ESOPHAGOGASTRODUODENOSCOPY (EGD) WITH PROPOFOL  03/01/2012   Procedure: ESOPHAGOGASTRODUODENOSCOPY (EGD) WITH PROPOFOL;  Surgeon: Arta Silence, MD;  Location: [facility];  Service: Endoscopy;  Laterality: N/A;  . ESOPHAGOGASTRODUODENOSCOPY (EGD) WITH PROPOFOL N/A 01/31/2013   Procedure: ESOPHAGOGASTRODUODENOSCOPY (EGD) WITH PROPOFOL;  Surgeon: Arta Silence, MD;  Location: [facility];  Service: Endoscopy;  Laterality: N/A;  . ESOPHAGOGASTRODUODENOSCOPY (EGD) WITH PROPOFOL N/A 09/02/2015   Procedure: ESOPHAGOGASTRODUODENOSCOPY (EGD) WITH PROPOFOL;  Surgeon: Gatha Mayer, MD;  Location: [facility];  Service: Endoscopy;  Laterality: N/A;  . GASTRIC BY-PASS  2004  . HERNIA REPAIR  2005   . INSERTION OF VENA CAVA FILTER  01-17-13   inserted 2004- "abdomen"  . LIGAMENT REPAIR Right 1987   Rt. knee scope  . MAXIMUM ACCESS (MAS)POSTERIOR LUMBAR INTERBODY FUSION (PLIF) 2 LEVEL N/A 06/07/2014   Procedure: L4-5 L5-S1 FOR  MAXIMUM ACCESS (MAS) POSTERIOR LUMBAR INTERBODY FUSION ;  Surgeon: Erline Levine, MD;  Location: [facility];  Service: Neurosurgery;  Laterality: N/A;  L4-5 L5-S1 FOR MAXIMUM ACCESS (MAS) POSTERIOR LUMBAR INTERBODY FUSION   . TONSILLECTOMY     as child     MEDICATIONS/ALLERGIES   Current Meds  Medication Sig  . ACCU-CHEK SOFTCLIX LANCETS lancets CHECK BLOOD SUGARS TWICE DAILY  . Alcohol Swabs (B-D SINGLE USE SWABS REGULAR) PADS USE TO WIPE AREA TO CHECK BLOOD SUGAR DAILY   . amLODipine (NORVASC) 10 MG tablet TAKE 1 TABLET EVERY DAY  . BLACK COHOSH PO Take 1 tablet by mouth daily.  . Blood Glucose Calibration (ACCU-CHEK AVIVA) SOLN Use as directed  . Blood Glucose Monitoring Suppl (ACCU-CHEK AVIVA PLUS) w/Device KIT USE AS DIRECTED  TO CHECK BLOOD SUGAR EVERY DAY  . DULoxetine (CYMBALTA) 30 MG capsule TAKE 1 TO 2 CAPSULES EVERY DAY  . furosemide (LASIX) 40 MG tablet TAKE 1 TABLET DAILY AS NEEDED FOR EDEMA  . glucose blood (ACCU-CHEK AVIVA PLUS) test strip 1 each by Other route 2 (two) times daily. Use to check blood sugars twice a day  . Multiple Vitamins-Minerals (MULTIVITAMINS THER. W/MINERALS) TABS Take 1 tablet by mouth daily.   . potassium chloride SA (K-DUR) 20 MEQ tablet TAKE 1 TABLET EVERY DAY  . pregabalin (LYRICA) 300 MG capsule Take 1 capsule (300 mg total) by mouth 2 (two) times daily.  . rivaroxaban (XARELTO) 20 MG TABS tablet Take 1 tablet (20 mg total) by mouth daily with supper.  . traMADol (ULTRAM) 50 MG tablet TAKE 1 TABLET(50 MG) BY MOUTH FOUR TIMES DAILY    Allergies  Allergen Reactions  . Carafate [Sucralfate] Hives    Other reaction(s): Angioedema (ALLERGY/intolerance) hives scratching  . Sulfonamide Derivatives Rash    REACTION:  Syncope     SOCIAL HISTORY/FAMILY HISTORY   Social History   Tobacco Use  . Smoking status: Former Smoker    Packs/day: 0.50    Years: 10.00    Pack years: 5.00    Types: Cigarettes    Quit date: 04/19/2009    Years since quitting: 9.9  . Smokeless tobacco: Never Used  Substance Use Topics  . Alcohol use: Yes    Alcohol/week: 0.0 standard drinks    Comment: occ. social- wine-1 drink monthly  . Drug use: No   Social History   Social History Narrative   Widow.  No kids.  Lives with her mother who is 55.  She does walk with a cane.  She is a retired Investment banker, corporate   She quit smoking in 2003    Family History family history includes Alzheimer's disease in her father and paternal grandmother; Atrial fibrillation in her mother; Diabetes in her maternal grandmother and mother; Healthy in her sister; Heart disease in her father and maternal grandmother; Hypertension in her father, maternal grandfather, mother, and paternal grandmother; Kidney disease in her brother; Prostate cancer in her father; Stroke (age of onset: 16) in her maternal grandfather.   OBJCTIVE -PE, EKG, labs   Wt Readings from Last 3 Encounters:  03/13/19 267 lb (121.1 kg)  01/02/19 271 lb (122.9 kg)  12/05/18 262 lb 6.4 oz (119 kg)    Physical Exam: BP 120/66 (BP Location: Left Arm, Patient Position: Sitting, Cuff Size: Large)   Pulse 69   Temp (!) 97 F (36.1 C)   Ht '5\' 7"'$  (1.702 m)   Wt 267 lb (121.1 kg)   BMI 41.82 kg/m  Physical Exam  Constitutional: She is oriented to person, place, and time. She appears well-developed and well-nourished. No distress.  Morbidly obese, well-groomed.  Walks in with a cane with a slow unsteady gait.  HENT:  Head: Normocephalic and atraumatic.  Eyes: EOM are normal.  Neck: Normal range of motion. Neck supple. No hepatojugular reflux and no JVD present. Carotid bruit is not present.  Cardiovascular: Normal rate, regular rhythm, S1 normal and S2 normal.  No  extrasystoles are present. PMI is not displaced (Unable to palpate). Exam reveals distant heart sounds and decreased pulses (1+ pedal pulses, difficult to assess because of tense edema and support stockings). Exam reveals no gallop, no S4 and no friction rub.  Murmur (Sem1/6 at RUSB.) heard. Pulmonary/Chest: She exhibits tenderness (Reproducible on palpation parasternal chest discomfort.).  Somewhat distant breath sounds without wheezes rales or rhonchi.  Shallow effort.  Abdominal: Soft. Bowel sounds are normal. She exhibits no distension. There is no abdominal tenderness.  Unable to assess HSM because of body habitus.  Musculoskeletal: Normal range of motion.     Comments: Notable for only 4/5 grip and upper arms strength on the left.  5/5 right  Neurological: She is alert and oriented to person, place, and time. No cranial nerve deficit.  Skin: She is not diaphoretic.  Psychiatric: She has a normal mood and affect. Her behavior is normal. Judgment and thought content normal.  Vitals reviewed.    Adult ECG Report  Rate: 69 ;  Rhythm: normal sinus rhythm and Normal axis, intervals and durations.;   Narrative Interpretation: Normal EKG  Recent Labs:    Lab Results  Component Value Date   CHOL 146 01/02/2019   HDL 73.40 01/02/2019   LDLCALC 60 01/02/2019   TRIG 66.0 01/02/2019   CHOLHDL 2 01/02/2019   Lab Results  Component Value Date   CREATININE 0.63 01/02/2019   BUN 12 01/02/2019   NA 141 01/02/2019   K 4.3 01/02/2019   CL 111 01/02/2019   CO2 24 01/02/2019    ASSESSMENT/PLAN    Problem List Items Addressed This Visit    Morbid obesity ([place]) (Chronic)    Weight increasing roughly 20 pounds over the last 6 months.  She is extremely sedentary and immobilized because of her leg pain.  She is not able to exercise at all now partially because of dyspnea.  Certainly the morbid obesity is contributing to this and there is years of being 400+ pounds the could have contributed to  be obesity hypoventilation syndrome.  We need to exclude cardiac ischemia and pulmonary hypertension before recommending more aggressive exercise regimen.  Also reiterated importance of dietary modification.  Eating small meals but multiple small meals is probably not the answer.  Need to avoid grazing      OBSTRUCTIVE SLEEP APNEA (Chronic)    She has a history of OSA, and current Epworth score is 20.  I am concerned that some of her edema could very well be related to pulmonary hypertension and obesity ventilation syndrome.  This could explain some of her dyspnea as well.  For now I would like to check 2D echocardiogram and event monitor looking for potential sleep disordered breathing or arrhythmias is as well as evaluating EF and pulmonary pressures.  Once we have excluded ischemia and arrhythmias, we will then look to referral for polysomnogram.      Essential hypertension (Chronic)    Blood pressures controlled.  She is not taking Lasix routinely.  She is on Norvasc  at 10 mg, which can cause edema.  If no other cause can be found, may want to consider switching from amlodipine to a nonvasodilatory blood pressure medication.      S/P gastric bypass (Chronic)    Initially lost from over 400 pounds, but now regaining weight.      History of deep vein thrombosis (DVT) of lower extremity (Chronic)    She does have a distant history of DVT, but most recent Dopplers have not shown any evidence of DVT.  She probably has bilateral deep venous reflux as noted on the left side.  That may be related to her prior DVTs.  However there is no indication at present to treat with Xarelto.  We can probably stop Xarelto, but would prefer to wait for the echocardiogram result.  However, there is consideration of prophylactic treatment in a patient who has an IVC filter.-->  Anticoagulation was not listed as one of the recommendations from Dr. Carlis Abbott.      Systolic ejection murmur (Chronic)    She has  heart murmur listed as a past medical history and has also recorded history of heart failure.  I do not see any prior evaluation and echocardiogram.  Her heart exam was difficult because of her body habitus.  Plan: 2D echocardiogram to assess with cardiac evaluation and evaluate for cause of murmur, as well as edema and dyspnea.      Relevant Orders   ZIO PATCH---LONG TERM MONITOR (3-14 DAYS)   ECHOCARDIOGRAM COMPLETE   Chest pain with moderate risk for cardiac etiology - Primary    No significant EKG changes.  However with her multiple other symptoms, would simply want to exclude ischemia.  Given her body habitus, IV would not be effective with high likelihood of having lack of specificity.  More appropriate study would be coronary CT angiogram which will allow for identification coronary anatomy along with CT FFR if necessary for physiologic evaluation.      Relevant Orders   EKG 12-Lead (Completed)   CT CORONARY MORPH W/CTA COR W/SCORE W/CA W/CM &/OR WO/CM   CT CORONARY FRACTIONAL FLOW RESERVE DATA PREP   CT CORONARY FRACTIONAL FLOW RESERVE FLUID ANALYSIS   Basic metabolic panel   ZIO PATCH---LONG TERM MONITOR (3-14 DAYS)   ECHOCARDIOGRAM COMPLETE   Dyspnea on exertion    Most likely multifactorial however with the concern of chest tightness and palpitations and profoundly worsening exertional dyspnea, I think we need to exclude ischemia.  We also need exclude obesity hypoventilation/pulmonary hypertension.  Plan: 2D echo and Coronary CT angiogram (with CT FFR) to exclude coronary ischemia.      Relevant Orders   EKG 12-Lead (Completed)   CT CORONARY MORPH W/CTA COR W/SCORE W/CA W/CM &/OR WO/CM   CT CORONARY FRACTIONAL FLOW RESERVE DATA PREP   CT CORONARY FRACTIONAL FLOW RESERVE FLUID ANALYSIS   Basic metabolic panel   ZIO PATCH---LONG TERM MONITOR (3-14 DAYS)   ECHOCARDIOGRAM COMPLETE   Rapid heartbeat    She has these bursts of rapid heart rates that described above.   They sound more consistent with may be short bursts of PAT or PSVT.  Need to exclude A. fib or other arrhythmias.  Plan: 14-day Zio patch monitor.  Also check 2D echo.      Relevant Orders   EKG 12-Lead (Completed)   CT CORONARY MORPH W/CTA COR W/SCORE W/CA W/CM &/OR WO/CM   CT CORONARY FRACTIONAL FLOW RESERVE DATA PREP   CT CORONARY FRACTIONAL FLOW RESERVE FLUID  ANALYSIS   Basic metabolic panel   ZIO PATCH---LONG TERM MONITOR (3-14 DAYS)   ECHOCARDIOGRAM COMPLETE    Other Visit Diagnoses    Shortness of breath       Relevant Orders   CT CORONARY MORPH W/CTA COR W/SCORE W/CA W/CM &/OR WO/CM   CT CORONARY FRACTIONAL FLOW RESERVE DATA PREP   CT CORONARY FRACTIONAL FLOW RESERVE FLUID ANALYSIS       COVID-19 Education: The signs and symptoms of COVID-19 were discussed with the patient and how to seek care for testing (follow up with PCP or arrange E-visit).   The importance of social distancing was discussed today.  I spent a total of 30 minutes with the patient and chart review. >  50% of the time was spent in direct patient consultation.  Additional time spent with chart review (studies, outside notes, etc): 25 Total Time: 55 min  --> Extensive medical history reviewed in the chart including several clinic notes, consultation notes and prior studies.  Entire past medical history was updated.   Current medicines are reviewed at length with the patient today.  (+/- concerns) n/a   Patient Instructions / Medication Changes & Studies & Tests Ordered   Patient Instructions  Medication Instructions:   CHANGE  LASIX TO  TAKING 3 DAYS A WEEK   *If you need a refill on your cardiac medications before your next appointment, please call your pharmacy*  Lab Work: SEE INSTRUCTIONS SHEET     Testing/Procedures:  Orders Placed This Encounter  Procedures  . CT CORONARY MORPH W/CTA COR W/SCORE W/CA W/CM &/OR WO/CM  . CT CORONARY FRACTIONAL FLOW RESERVE DATA PREP  . CT CORONARY  FRACTIONAL FLOW RESERVE FLUID ANALYSIS  . Basic metabolic panel  . ZIO PATCH---LONG TERM MONITOR (3-14 DAYS)  . EKG 12-Lead  . ECHOCARDIOGRAM COMPLETE     Glenetta Hew, M.D., M.S. Interventional Cardiologist   Pager # [phone] Phone # [phone] [address] [place], [place] [address]   Thank you for choosing [facility] at [facility]!!

## 2019-03-13 NOTE — Telephone Encounter (Signed)
SPOKE TO  PATIENT       COVID-19 Pre-Screening Questions:  . In the past 7 to 10 days have you had a cough,  shortness of breath, headache, congestion, fever (100 or greater) body aches, chills, sore throat, or sudden loss of taste or sense of smell? NO  . Have you been around anyone with known Covid 19. NO  . Have you been around anyone who is awaiting Covid 19 test results in the past 7 to 10 days?NO  . Have you been around anyone who has been exposed to Covid 19, or has mentioned symptoms of Covid 19 within the past 7 to 10 days?NO  PATIENT STATES HSE DID HAVE COVID FIRST PART OF OCT 2020 - CAME BACK NEGATIVE.   INFORMED PATIENT WILL SEE HER THIS AFTERNOON.   PATIENT VERBALIZED UNDERSTANDING.

## 2019-03-15 ENCOUNTER — Encounter: Payer: Self-pay | Admitting: Cardiology

## 2019-03-15 NOTE — Assessment & Plan Note (Signed)
Initially lost from over 400 pounds, but now regaining weight.

## 2019-03-15 NOTE — Assessment & Plan Note (Signed)
Blood pressures controlled.  She is not taking Lasix routinely.  She is on Norvasc at 10 mg, which can cause edema.  If no other cause can be found, may want to consider switching from amlodipine to a nonvasodilatory blood pressure medication.

## 2019-03-15 NOTE — Assessment & Plan Note (Signed)
She has a history of OSA, and current Epworth score is 20.  I am concerned that some of her edema could very well be related to pulmonary hypertension and obesity ventilation syndrome.  This could explain some of her dyspnea as well.  For now I would like to check 2D echocardiogram and event monitor looking for potential sleep disordered breathing or arrhythmias is as well as evaluating EF and pulmonary pressures.  Once we have excluded ischemia and arrhythmias, we will then look to referral for polysomnogram.

## 2019-03-15 NOTE — Assessment & Plan Note (Signed)
Weight increasing roughly 20 pounds over the last 6 months.  She is extremely sedentary and immobilized because of her leg pain.  She is not able to exercise at all now partially because of dyspnea.  Certainly the morbid obesity is contributing to this and there is years of being 400+ pounds the could have contributed to be obesity hypoventilation syndrome.  We need to exclude cardiac ischemia and pulmonary hypertension before recommending more aggressive exercise regimen.  Also reiterated importance of dietary modification.  Eating small meals but multiple small meals is probably not the answer.  Need to avoid grazing

## 2019-03-15 NOTE — Assessment & Plan Note (Signed)
She has these bursts of rapid heart rates that described above.  They sound more consistent with may be short bursts of PAT or PSVT.  Need to exclude A. fib or other arrhythmias.  Plan: 14-day Zio patch monitor.  Also check 2D echo.

## 2019-03-15 NOTE — Assessment & Plan Note (Signed)
Most likely multifactorial however with the concern of chest tightness and palpitations and profoundly worsening exertional dyspnea, I think we need to exclude ischemia.  We also need exclude obesity hypoventilation/pulmonary hypertension.  Plan: 2D echo and Coronary CT angiogram (with CT FFR) to exclude coronary ischemia.

## 2019-03-15 NOTE — Assessment & Plan Note (Signed)
She has heart murmur listed as a past medical history and has also recorded history of heart failure.  I do not see any prior evaluation and echocardiogram.  Her heart exam was difficult because of her body habitus.  Plan: 2D echocardiogram to assess with cardiac evaluation and evaluate for cause of murmur, as well as edema and dyspnea.

## 2019-03-15 NOTE — Assessment & Plan Note (Signed)
No significant EKG changes.  However with her multiple other symptoms, would simply want to exclude ischemia.  Given her body habitus, IV would not be effective with high likelihood of having lack of specificity.  More appropriate study would be coronary CT angiogram which will allow for identification coronary anatomy along with CT FFR if necessary for physiologic evaluation.

## 2019-03-15 NOTE — Assessment & Plan Note (Addendum)
She does have a distant history of DVT, but most recent Dopplers have not shown any evidence of DVT.  She probably has bilateral deep venous reflux as noted on the left side.  That may be related to her prior DVTs.  However there is no indication at present to treat with Xarelto.  We can probably stop Xarelto, but would prefer to wait for the echocardiogram result.  However, there is consideration of prophylactic treatment in a patient who has an IVC filter.-->  Anticoagulation was not listed as one of the recommendations from Dr. Carlis Abbott.

## 2019-03-20 ENCOUNTER — Telehealth: Payer: Self-pay | Admitting: *Deleted

## 2019-03-20 HISTORY — PX: TRANSTHORACIC ECHOCARDIOGRAM: SHX275

## 2019-03-20 HISTORY — PX: OTHER SURGICAL HISTORY: SHX169

## 2019-03-20 NOTE — Telephone Encounter (Signed)
14 day ZIO XT long term holter monitor to be mailed to the patient home.  Patient instructed not to apply monitor until after her Echocardiogram on 03/28/19.  The Zio patch monitor cannot be removed and re-applied.

## 2019-03-21 ENCOUNTER — Ambulatory Visit: Payer: Medicare HMO | Admitting: Family Medicine

## 2019-03-21 NOTE — Progress Notes (Deleted)
[name] [address] [place], [place] [address] Phone: [phone] Subjective:    I'm seeing this patient by the request  of:    CC:   CMK:[id number]   06/15/2018 Weakness noted.  Patient also has slurred speech.  Called EMS to transport patient.  Because these symptoms did start the day before we will hold on any type occult stroke at the moment.  Vitals were still elevated blood pressure at the time of leaving.  Patient still stated that she still had a headache.  Update 03/21/2019 [name] is a 63 y.o. female coming in with complaint of hip and leg pain. Patient states     Past Medical History:  Diagnosis Date  . Anemia    takes Ferrous Sulfate daily  . Anxiety    takes Citalopram daily  . Arthritis   . Asthma    2004-prior to gastric bypass and no problems since  . CHF (congestive heart failure) (HCC)    takes Lasix daily as needed  . Chronic back pain    spondylolisthesis/stenosis/radiculopathy  . Complication of anesthesia yrs ago   slow to wake up  . Depression   . Diabetes ([place])   . DVT (deep venous thrombosis) ([facility]) 01/17/2013   past hx. -tx.5-6 yrs ago bilateral legs, occ. sporadic swelling, has IVC filter implanted  . Dysrhythmia    "heart tends to flutter"  . Fibromyalgia   . Fracture    right foot and is in a cam boot  . Gallstones   . GERD (gastroesophageal reflux disease)    hx of-no meds now  . Heart murmur   . History of bronchitis 2012 or 2013  . History of colon polyps   . Hypertension    takes Metoprolol daily  . Insomnia    takes Melatonin daily  . Pelvic floor dysfunction   . Peripheral neuropathy   . Pneumonia 90's   hx of  . S/P gastric bypass 2003  . Sleep apnea    no cpap used in many yrs after weight lost-no machine now  . Urinary frequency   . Urinary urgency   . Vitamin D deficiency    Past Surgical History:  Procedure Laterality Date  . BALLOON DILATION N/A 01/31/2013   Procedure: BALLOON DILATION;  Surgeon: Arta Silence, MD;  Location: [facility];  Service: Endoscopy;  Laterality: N/A;  . CHOLECYSTECTOMY  2007  . COLONOSCOPY    . DILATION AND CURETTAGE OF UTERUS  yrs ago  . ESOPHAGOGASTRODUODENOSCOPY (EGD) WITH PROPOFOL  03/01/2012   Procedure: ESOPHAGOGASTRODUODENOSCOPY (EGD) WITH PROPOFOL;  Surgeon: Arta Silence, MD;  [facility]: [facility];  Service: Endoscopy;  Laterality: N/A;  . ESOPHAGOGASTRODUODENOSCOPY (EGD) WITH PROPOFOL N/A 01/31/2013   Procedure: ESOPHAGOGASTRODUODENOSCOPY (EGD) WITH PROPOFOL;  Surgeon: Arta Silence, MD;  [facility];  Service: Endoscopy;  Laterality: N/A;  . ESOPHAGOGASTRODUODENOSCOPY (EGD) WITH PROPOFOL N/A 09/02/2015   Procedure: ESOPHAGOGASTRODUODENOSCOPY (EGD) WITH PROPOFOL;  Surgeon: Gatha Mayer, MD;  [facility];  Service: Endoscopy;  Laterality: N/A;  . GASTRIC BY-PASS  2004  . HERNIA REPAIR  2005  . INSERTION OF VENA CAVA FILTER  01-17-13   inserted 2004- "abdomen"  . LIGAMENT REPAIR Right 1987   Rt. knee scope  . MAXIMUM ACCESS (MAS)POSTERIOR LUMBAR INTERBODY FUSION (PLIF) 2 LEVEL N/A 06/07/2014   Procedure: L4-5 L5-S1 FOR MAXIMUM ACCESS (MAS) POSTERIOR LUMBAR INTERBODY FUSION ;  Surgeon: Erline Levine, MD;  Location: [facility];  Service: Neurosurgery;  Laterality: N/A;  L4-5 L5-S1 FOR MAXIMUM ACCESS (MAS) POSTERIOR LUMBAR INTERBODY FUSION   . TONSILLECTOMY     as child   Social History   Socioeconomic History  . Marital status: Widowed    Spouse name: Not on file  . Number of children: 0  . Years of education: college  . Highest education level: Not on file  Occupational History  . Occupation: retired  Scientific laboratory technician  . Financial resource strain: Not on file  . Food insecurity    Worry: Not on file    Inability: Not on file  . Transportation needs    Medical: Not on file    Non-medical: Not on file  Tobacco Use  . Smoking status: Former Smoker    Packs/day: 0.50     Years: 10.00    Pack years: 5.00    Types: Cigarettes    Quit date: 04/19/2009    Years since quitting: 9.9  . Smokeless tobacco: Never Used  Substance and Sexual Activity  . Alcohol use: Yes    Alcohol/week: 0.0 standard drinks    Comment: occ. social- wine-1 drink monthly  . Drug use: No  . Sexual activity: Not Currently  Lifestyle  . Physical activity    Days per week: Not on file    Minutes per session: Not on file  . Stress: Not on file  Relationships  . Social Herbalist on phone: Not on file    Gets together: Not on file    Attends religious service: Not on file    Active member of club or organization: Not on file    Attends meetings of clubs or organizations: Not on file    Relationship status: Not on file  Other Topics Concern  . Not on file  Social History Narrative   Widow.  No kids.  Lives with her mother who is 74.  She does walk with a cane.  She is a retired Investment banker, corporate   She quit smoking in 2003   Allergies  Allergen Reactions  . Carafate [Sucralfate] Hives    Other reaction(s): Angioedema (ALLERGY/intolerance) hives scratching  . Sulfonamide Derivatives Rash    REACTION: Syncope   Family History  Problem Relation Age of Onset  . Diabetes Mother   . Atrial fibrillation Mother   . Hypertension Mother   . Heart disease Father        Unsure of details  . Hypertension Father   . Prostate cancer Father   . Alzheimer's disease Father   . Kidney disease Brother   . Healthy Sister   . Diabetes Maternal Grandmother   . Heart disease Maternal Grandmother   . Stroke Maternal Grandfather 50  . Hypertension Maternal Grandfather   . Hypertension Paternal Grandmother   . Alzheimer's disease Paternal Grandmother   . Colon cancer Neg Hx   . Colon polyps Neg Hx   . Stomach cancer Neg Hx   . Esophageal cancer Neg Hx   . Pancreatic cancer Neg Hx   . Liver disease Neg Hx      Current Outpatient Medications (Cardiovascular):  .   amLODipine (NORVASC) 10 MG tablet, TAKE 1 TABLET EVERY DAY .  furosemide (LASIX) 40 MG tablet, TAKE 1 TABLET DAILY AS NEEDED FOR EDEMA .  metoprolol tartrate (LOPRESSOR) 50 MG tablet, Take 2 tablets (100 mg total) by mouth once for 1 dose. TAKE ONE HOUR PRIOR TO  SCHEDULE CARDIAC TEST   Current Outpatient Medications (Analgesics):  .  traMADol (ULTRAM) 50 MG tablet, TAKE 1 TABLET(50 MG) BY MOUTH FOUR TIMES DAILY  Current Outpatient Medications (Hematological):  .  rivaroxaban (XARELTO) 20 MG TABS tablet, Take 1 tablet (20 mg total) by mouth daily with supper.  Current Outpatient Medications (Other):  Marland Kitchen  ACCU-CHEK SOFTCLIX LANCETS lancets, CHECK BLOOD SUGARS TWICE DAILY .  Alcohol Swabs (B-D SINGLE USE SWABS REGULAR) PADS, USE TO WIPE AREA TO CHECK BLOOD SUGAR DAILY  .  BLACK COHOSH PO, Take 1 tablet by mouth daily. .  Blood Glucose Calibration (ACCU-CHEK AVIVA) SOLN, Use as directed .  Blood Glucose Monitoring Suppl (ACCU-CHEK AVIVA PLUS) w/Device KIT, USE AS DIRECTED  TO CHECK BLOOD SUGAR EVERY DAY .  DULoxetine (CYMBALTA) 30 MG capsule, TAKE 1 TO 2 CAPSULES EVERY DAY .  glucose blood (ACCU-CHEK AVIVA PLUS) test strip, 1 each by Other route 2 (two) times daily. Use to check blood sugars twice a day .  Multiple Vitamins-Minerals (MULTIVITAMINS THER. W/MINERALS) TABS, Take 1 tablet by mouth daily.  .  potassium chloride SA (K-DUR) 20 MEQ tablet, TAKE 1 TABLET EVERY DAY .  pregabalin (LYRICA) 300 MG capsule, Take 1 capsule (300 mg total) by mouth 2 (two) times daily.    Past medical history, social, surgical and family history all reviewed in electronic medical record.  No pertanent information unless stated regarding to the chief complaint.   Review of Systems:  No headache, visual changes, nausea, vomiting, diarrhea, constipation, dizziness, abdominal pain, skin rash, fevers, chills, night sweats, weight loss, swollen lymph nodes, body aches, joint swelling, muscle aches, chest pain,  shortness of breath, mood changes.   Objective  There were no vitals taken for this visit. Systems examined below as of    General: No apparent distress alert and oriented x3 mood and affect normal, dressed appropriately.  HEENT: Pupils equal, extraocular movements intact  Respiratory: Patient's speak in full sentences and does not appear short of breath  Cardiovascular: No lower extremity edema, non tender, no erythema  Skin: Warm dry intact with no signs of infection or rash on extremities or on axial skeleton.  Abdomen: Soft nontender  Neuro: Cranial nerves II through XII are intact, neurovascularly intact in all extremities with 2+ DTRs and 2+ pulses.  Lymph: No lymphadenopathy of posterior or anterior cervical chain or axillae bilaterally.  Gait normal with good balance and coordination.  MSK:  Non tender with full range of motion and good stability and symmetric strength and tone of shoulders, elbows, wrist, hip, knee and ankles bilaterally.     Impression and Recommendations:     This case required medical decision making of moderate complexity. The above documentation has been reviewed and is accurate and complete [name]       Note: This dictation was prepared with Dragon dictation along with smaller phrase technology. Any transcriptional errors that result from this process are unintentional.

## 2019-03-26 ENCOUNTER — Encounter: Payer: Self-pay | Admitting: Internal Medicine

## 2019-03-28 ENCOUNTER — Other Ambulatory Visit: Payer: Self-pay

## 2019-03-28 ENCOUNTER — Ambulatory Visit (HOSPITAL_COMMUNITY): Payer: Medicare HMO | Attending: Internal Medicine

## 2019-03-28 DIAGNOSIS — R0600 Dyspnea, unspecified: Secondary | ICD-10-CM | POA: Diagnosis not present

## 2019-03-28 DIAGNOSIS — R079 Chest pain, unspecified: Secondary | ICD-10-CM | POA: Diagnosis not present

## 2019-03-28 DIAGNOSIS — R000 Tachycardia, unspecified: Secondary | ICD-10-CM | POA: Diagnosis not present

## 2019-03-28 DIAGNOSIS — R011 Cardiac murmur, unspecified: Secondary | ICD-10-CM | POA: Insufficient documentation

## 2019-03-28 DIAGNOSIS — R0609 Other forms of dyspnea: Secondary | ICD-10-CM

## 2019-03-30 ENCOUNTER — Ambulatory Visit (INDEPENDENT_AMBULATORY_CARE_PROVIDER_SITE_OTHER): Payer: Medicare HMO

## 2019-03-30 DIAGNOSIS — R0600 Dyspnea, unspecified: Secondary | ICD-10-CM

## 2019-03-30 DIAGNOSIS — R011 Cardiac murmur, unspecified: Secondary | ICD-10-CM

## 2019-03-30 DIAGNOSIS — R079 Chest pain, unspecified: Secondary | ICD-10-CM

## 2019-03-30 DIAGNOSIS — R0609 Other forms of dyspnea: Secondary | ICD-10-CM

## 2019-03-30 DIAGNOSIS — R000 Tachycardia, unspecified: Secondary | ICD-10-CM

## 2019-04-26 DIAGNOSIS — R079 Chest pain, unspecified: Secondary | ICD-10-CM | POA: Diagnosis not present

## 2019-04-26 DIAGNOSIS — R0600 Dyspnea, unspecified: Secondary | ICD-10-CM | POA: Diagnosis not present

## 2019-04-26 DIAGNOSIS — R000 Tachycardia, unspecified: Secondary | ICD-10-CM | POA: Diagnosis not present

## 2019-04-26 DIAGNOSIS — R002 Palpitations: Secondary | ICD-10-CM | POA: Diagnosis not present

## 2019-04-30 ENCOUNTER — Telehealth (HOSPITAL_COMMUNITY): Payer: Self-pay | Admitting: Emergency Medicine

## 2019-04-30 ENCOUNTER — Encounter (HOSPITAL_COMMUNITY): Payer: Self-pay

## 2019-04-30 NOTE — Telephone Encounter (Signed)
Left message on voicemail with name and callback number Darcie Mellone RN Navigator Cardiac Imaging [facility] [phone] Office [phone] Cell  

## 2019-04-30 NOTE — Telephone Encounter (Signed)
Pt returning phone call regarding upcoming cardiac imaging study; pt verbalizes understanding of appt date/time, parking situation and where to check in, pre-test NPO status and medications ordered, and verified current allergies; name and call back number provided for further questions should they arise Damarea Merkel RN Navigator Cardiac Imaging [facility] [phone] office [phone] cell   

## 2019-05-01 ENCOUNTER — Other Ambulatory Visit: Payer: Self-pay | Admitting: Cardiology

## 2019-05-01 DIAGNOSIS — R0600 Dyspnea, unspecified: Secondary | ICD-10-CM

## 2019-05-01 DIAGNOSIS — R079 Chest pain, unspecified: Secondary | ICD-10-CM | POA: Diagnosis not present

## 2019-05-01 DIAGNOSIS — R000 Tachycardia, unspecified: Secondary | ICD-10-CM

## 2019-05-01 DIAGNOSIS — R011 Cardiac murmur, unspecified: Secondary | ICD-10-CM

## 2019-05-01 DIAGNOSIS — R0609 Other forms of dyspnea: Secondary | ICD-10-CM

## 2019-05-02 ENCOUNTER — Ambulatory Visit (HOSPITAL_COMMUNITY): Admission: RE | Admit: 2019-05-02 | Payer: Medicare HMO | Source: Ambulatory Visit

## 2019-05-02 LAB — BASIC METABOLIC PANEL
BUN/Creatinine Ratio: 22 (ref 12–28)
BUN: 17 mg/dL (ref 8–27)
CO2: 19 mmol/L — ABNORMAL LOW (ref 20–29)
Calcium: 8.3 mg/dL — ABNORMAL LOW (ref 8.7–10.3)
Chloride: 115 mmol/L — ABNORMAL HIGH (ref 96–106)
Creatinine, Ser: 0.76 mg/dL (ref 0.57–1.00)
GFR calc Af Amer: 95 mL/min/{1.73_m2} (ref >59)
GFR calc non Af Amer: 83 mL/min/{1.73_m2} (ref >59)
Glucose: 85 mg/dL (ref 65–99)
Potassium: 4.7 mmol/L (ref 3.5–5.2)
Sodium: 145 mmol/L — ABNORMAL HIGH (ref 134–144)

## 2019-05-11 ENCOUNTER — Ambulatory Visit: Payer: Medicare HMO | Admitting: Family Medicine

## 2019-05-11 ENCOUNTER — Ambulatory Visit (INDEPENDENT_AMBULATORY_CARE_PROVIDER_SITE_OTHER): Payer: Medicare HMO

## 2019-05-11 ENCOUNTER — Encounter: Payer: Self-pay | Admitting: Family Medicine

## 2019-05-11 ENCOUNTER — Other Ambulatory Visit: Payer: Self-pay

## 2019-05-11 VITALS — BP 130/90 | HR 80 | Ht 67.0 in | Wt 268.0 lb

## 2019-05-11 DIAGNOSIS — S76011A Strain of muscle, fascia and tendon of right hip, initial encounter: Secondary | ICD-10-CM | POA: Diagnosis not present

## 2019-05-11 DIAGNOSIS — M25551 Pain in right hip: Secondary | ICD-10-CM

## 2019-05-11 MED ORDER — PREDNISONE 50 MG PO TABS: ORAL_TABLET | ORAL | 0 refills | Status: DC

## 2019-05-11 NOTE — Progress Notes (Signed)
[facility] [address] [place] Phone: [phone] Subjective:   I [name] am serving as a Education administrator for Dr. Hulan Saas.  This visit occurred during the SARS-CoV-2 public health emergency.  Safety protocols were in place, including screening questions prior to the visit, additional usage of staff PPE, and extensive cleaning of exam room while observing appropriate contact time as indicated for disinfecting solutions.   I'm seeing this patient by the request  of:  Hoyt Koch, MD  CC: right hip pain   TDS:[id number]   06/15/2018 Weakness noted.  Patient also has slurred speech.  Called EMS to transport patient.  Because these symptoms did start the day before we will hold on any type occult stroke at the moment.  Vitals were still elevated blood pressure at the time of leaving.  Patient still stated that she still had a headache.  05/11/2019 [name] is a 64 y.o. female coming in with complaint of right leg pain. Patient states she does not remember an injury. Starts from the hip and runs down the lateral leg.   Onset- Tuesday Location - lateral right hip  Character- sharp, dull  Aggravating factors- standing Therapies tried- ice, heat, motrin  Severity-   7/10 at its worse     Past Medical History:  Diagnosis Date  . Anemia    takes Ferrous Sulfate daily  . Anxiety    takes Citalopram daily  . Arthritis   . Asthma    2004-prior to gastric bypass and no problems since  . CHF (congestive heart failure) (HCC)    takes Lasix daily as needed  . Chronic back pain    spondylolisthesis/stenosis/radiculopathy  . Complication of anesthesia yrs ago   slow to wake up  . Depression   . Diabetes ([place])   . DVT (deep venous thrombosis) ([place]) 01/17/2013   past hx. -tx.5-6 yrs ago bilateral legs, occ. sporadic swelling, has IVC filter implanted  . Dysrhythmia    "heart tends to flutter"  . Fibromyalgia   .  Fracture    right foot and is in a cam boot  . Gallstones   . GERD (gastroesophageal reflux disease)    hx of-no meds now  . Heart murmur   . History of bronchitis 2012 or 2013  . History of colon polyps   . Hypertension    takes Metoprolol daily  . Insomnia    takes Melatonin daily  . Pelvic floor dysfunction   . Peripheral neuropathy   . Pneumonia 90's   hx of  . S/P gastric bypass 2003  . Sleep apnea    no cpap used in many yrs after weight lost-no machine now  . Urinary frequency   . Urinary urgency   . Vitamin D deficiency    Past Surgical History:  Procedure Laterality Date  . BALLOON DILATION N/A 01/31/2013   Procedure: BALLOON DILATION;  Surgeon: Arta Silence, MD;  [facility];  Service: Endoscopy;  Laterality: N/A;  . CHOLECYSTECTOMY  2007  . COLONOSCOPY    . DILATION AND CURETTAGE OF UTERUS  yrs ago  . ESOPHAGOGASTRODUODENOSCOPY (EGD) WITH PROPOFOL  03/01/2012   Procedure: ESOPHAGOGASTRODUODENOSCOPY (EGD) WITH PROPOFOL;  Surgeon: Arta Silence, MD;  [facility];  Service: Endoscopy;  Laterality: N/A;  . ESOPHAGOGASTRODUODENOSCOPY (EGD) WITH PROPOFOL N/A 01/31/2013   Procedure: ESOPHAGOGASTRODUODENOSCOPY (EGD) WITH PROPOFOL;  Surgeon: Arta Silence, MD;  [facility];  Service: Endoscopy;  Laterality: N/A;  .  ESOPHAGOGASTRODUODENOSCOPY (EGD) WITH PROPOFOL N/A 09/02/2015   Procedure: ESOPHAGOGASTRODUODENOSCOPY (EGD) WITH PROPOFOL;  Surgeon: Gatha Mayer, MD;  [facility];  Service: Endoscopy;  Laterality: N/A;  . GASTRIC BY-PASS  2004  . HERNIA REPAIR  2005  . INSERTION OF VENA CAVA FILTER  01-17-13   inserted 2004- "abdomen"  . LIGAMENT REPAIR Right 1987   Rt. knee scope  . MAXIMUM ACCESS (MAS)POSTERIOR LUMBAR INTERBODY FUSION (PLIF) 2 LEVEL N/A 06/07/2014   Procedure: L4-5 L5-S1 FOR MAXIMUM ACCESS (MAS) POSTERIOR LUMBAR INTERBODY FUSION ;  Surgeon: Erline Levine, MD;  Location: [facility];  Service: Neurosurgery;   Laterality: N/A;  L4-5 L5-S1 FOR MAXIMUM ACCESS (MAS) POSTERIOR LUMBAR INTERBODY FUSION   . TONSILLECTOMY     as child   Social History   Socioeconomic History  . Marital status: Widowed    Spouse name: Not on file  . Number of children: 0  . Years of education: college  . Highest education level: Not on file  Occupational History  . Occupation: retired  Tobacco Use  . Smoking status: Former Smoker    Packs/day: 0.50    Years: 10.00    Pack years: 5.00    Types: Cigarettes    Quit date: 04/19/2009    Years since quitting: 10.0  . Smokeless tobacco: Never Used  Substance and Sexual Activity  . Alcohol use: Yes    Alcohol/week: 0.0 standard drinks    Comment: occ. social- wine-1 drink monthly  . Drug use: No  . Sexual activity: Not Currently  Other Topics Concern  . Not on file  Social History Narrative   Widow.  No kids.  Lives with her mother who is 57.  She does walk with a cane.  She is a retired Investment banker, corporate   She quit smoking in 2003   Social Determinants of Health   Financial Resource Strain:   . Difficulty of Paying Living Expenses: Not on file  Food Insecurity:   . Worried About Charity fundraiser in the Last Year: Not on file  . Ran Out of Food in the Last Year: Not on file  Transportation Needs:   . Lack of Transportation (Medical): Not on file  . Lack of Transportation (Non-Medical): Not on file  Physical Activity:   . Days of Exercise per Week: Not on file  . Minutes of Exercise per Session: Not on file  Stress:   . Feeling of Stress : Not on file  Social Connections:   . Frequency of Communication with Friends and Family: Not on file  . Frequency of Social Gatherings with Friends and Family: Not on file  . Attends Religious Services: Not on file  . Active Member of Clubs or Organizations: Not on file  . Attends Archivist Meetings: Not on file  . Marital Status: Not on file   Allergies  Allergen Reactions  . Carafate  [Sucralfate] Hives    Other reaction(s): Angioedema (ALLERGY/intolerance) hives scratching  . Sulfonamide Derivatives Rash    REACTION: Syncope   Family History  Problem Relation Age of Onset  . Diabetes Mother   . Atrial fibrillation Mother   . Hypertension Mother   . Heart disease Father        Unsure of details  . Hypertension Father   . Prostate cancer Father   . Alzheimer's disease Father   . Kidney disease Brother   . Healthy Sister   . Diabetes Maternal Grandmother   . Heart disease  Maternal Grandmother   . Stroke Maternal Grandfather 50  . Hypertension Maternal Grandfather   . Hypertension Paternal Grandmother   . Alzheimer's disease Paternal Grandmother   . Colon cancer Neg Hx   . Colon polyps Neg Hx   . Stomach cancer Neg Hx   . Esophageal cancer Neg Hx   . Pancreatic cancer Neg Hx   . Liver disease Neg Hx     Current Outpatient Medications (Endocrine & Metabolic):  .  predniSONE (DELTASONE) 50 MG tablet, 1 tablet by mouth daily  Current Outpatient Medications (Cardiovascular):  .  amLODipine (NORVASC) 10 MG tablet, TAKE 1 TABLET EVERY DAY .  furosemide (LASIX) 40 MG tablet, TAKE 1 TABLET DAILY AS NEEDED FOR EDEMA .  metoprolol tartrate (LOPRESSOR) 50 MG tablet, Take 2 tablets (100 mg total) by mouth once for 1 dose. TAKE ONE HOUR PRIOR TO  SCHEDULE CARDIAC TEST   Current Outpatient Medications (Analgesics):  .  traMADol (ULTRAM) 50 MG tablet, TAKE 1 TABLET(50 MG) BY MOUTH FOUR TIMES DAILY  Current Outpatient Medications (Hematological):  .  rivaroxaban (XARELTO) 20 MG TABS tablet, Take 1 tablet (20 mg total) by mouth daily with supper.  Current Outpatient Medications (Other):  Marland Kitchen  ACCU-CHEK SOFTCLIX LANCETS lancets, CHECK BLOOD SUGARS TWICE DAILY .  Alcohol Swabs (B-D SINGLE USE SWABS REGULAR) PADS, USE TO WIPE AREA TO CHECK BLOOD SUGAR DAILY  .  BLACK COHOSH PO, Take 1 tablet by mouth daily. .  Blood Glucose Calibration (ACCU-CHEK AVIVA) SOLN, Use as  directed .  Blood Glucose Monitoring Suppl (ACCU-CHEK AVIVA PLUS) w/Device KIT, USE AS DIRECTED  TO CHECK BLOOD SUGAR EVERY DAY .  DULoxetine (CYMBALTA) 30 MG capsule, TAKE 1 TO 2 CAPSULES EVERY DAY .  glucose blood (ACCU-CHEK AVIVA PLUS) test strip, 1 each by Other route 2 (two) times daily. Use to check blood sugars twice a day .  Multiple Vitamins-Minerals (MULTIVITAMINS THER. W/MINERALS) TABS, Take 1 tablet by mouth daily.  .  potassium chloride SA (K-DUR) 20 MEQ tablet, TAKE 1 TABLET EVERY DAY .  pregabalin (LYRICA) 300 MG capsule, Take 1 capsule (300 mg total) by mouth 2 (two) times daily.    Past medical history, social, surgical and family history all reviewed in electronic medical record.  No pertanent information unless stated regarding to the chief complaint.   Review of Systems:  No headache, visual changes, nausea, vomiting, diarrhea, constipation, dizziness, abdominal pain, skin rash, fevers, chills, night sweats, weight loss, swollen lymph nodes,  joint swelling, chest pain, shortness of breath, mood changes. POSITIVE muscle aches, body aches  Objective  Blood pressure 130/90, pulse 80, height _0  (1.702 m), weight 268 lb (121.6 kg), SpO2 97 %.   General: No apparent distress alert and oriented x3 mood and affect normal, dressed appropriately.  HEENT: Pupils equal, extraocular movements intact  Respiratory: Patient's speak in full sentences and does not appear short of breath  Cardiovascular: No lower extremity edema, non tender, no erythema  Skin: Warm dry intact with no signs of infection or rash on extremities or on axial skeleton.  Abdomen: Soft nontender  Neuro: Cranial nerves II through XII are intact, neurovascularly intact in all extremities with 2+ DTRs and 2+ pulses.  Lymph: No lymphadenopathy of posterior or anterior cervical chain or axillae bilaterally.  Gait normal with good balance and coordination.  MSK: Right hip exam shows severe tenderness to palpation  over the lateral gluteal area.  Patient is unable to do [place] secondary to pain.  Patient does have good flexion of the hip though noted.  4-5 strength in lower extremity but symmetric to the contralateral side.  Deep tendon reflexes 1+ but symmetric as well.  Limited musculoskeletal ultrasound was performed and interpreted by Lyndal Pulley  Limited ultrasound shows the patient does have some hypoechoic changes within the gluteal tendon near the insertion on the greater trochanteric area.  Questionable increase in Doppler flow in the area.  Questionable overlying hematoma also noted. Impression: Likely fall with contusion and possible small partial tear of the gluteal tendon  Procedure: Real-time Ultrasound Guided Injection of right gluteal tendon sheath Device: GE Logiq Q7 Ultrasound guided injection is preferred based studies that show increased duration, increased effect, greater accuracy, decreased procedural pain, increased response rate, and decreased cost with ultrasound guided versus blind injection.  Verbal informed consent obtained.  Time-out conducted.  Noted no overlying erythema, induration, or other signs of local infection.  Skin prepped in a sterile fashion.  Local anesthesia: Topical Ethyl chloride.  With sterile technique and under real time ultrasound guidance: With a 21-gauge 2 inch needle injected with 0.5 cc of 0.5% Marcaine and 1 cc of Kenalog 40 mg/mL into the tendon sheath. Completed without difficulty  Pain immediately resolved suggesting accurate placement of the medication.  Advised to call if fevers/chills, erythema, induration, drainage, or persistent bleeding.  Images permanently stored and available for review in the ultrasound unit.  Impression: Technically successful ultrasound guided injection.     Impression and Recommendations:     This case required medical decision making of moderate complexity. The above documentation has been reviewed and is  accurate and complete Lyndal Pulley, DO       Note: This dictation was prepared with Dragon dictation along with smaller phrase technology. Any transcriptional errors that result from this process are unintentional.

## 2019-05-11 NOTE — Patient Instructions (Signed)
Exercise 3 times a week Prednisone daily for 5 days Xray today on the hip Pennsaid small amount over painful spot  See me again in 4 weeks

## 2019-05-11 NOTE — Assessment & Plan Note (Signed)
Strain of the gluteal tendon

## 2019-05-13 ENCOUNTER — Ambulatory Visit: Payer: Medicare HMO | Attending: Internal Medicine

## 2019-05-13 DIAGNOSIS — Z23 Encounter for immunization: Secondary | ICD-10-CM

## 2019-05-14 NOTE — Progress Notes (Signed)
   Covid-19 Vaccination Clinic  Name:  [name]    MRN: [id number] DOB: 12-Apr-1956  05/13/2019  [name] was observed post Covid-19 immunization for 15 minutes without incidence. She was provided with Vaccine Information Sheet and instruction to access the V-Safe system.   [name] was instructed to call 911 with any severe reactions post vaccine: Marland Kitchen Difficulty breathing  . Swelling of your face and throat  . A fast heartbeat  . A bad rash all over your body  . Dizziness and weakness    Immunizations Administered    Name Date Dose VIS Date Route   Moderna COVID-19 Vaccine 05/13/2019  3:40 PM 0.5 mL 03/20/2019 Intramuscular   Manufacturer: Levan Hurst   Lot: [id number]   [id number]      Documented on behalf of: P. Joya Gaskins

## 2019-05-17 ENCOUNTER — Ambulatory Visit: Payer: Medicare HMO | Admitting: Cardiology

## 2019-05-17 ENCOUNTER — Other Ambulatory Visit: Payer: Self-pay

## 2019-05-17 NOTE — Progress Notes (Deleted)
Primary Care Provider: Hoyt Koch, MD Cardiologist: No primary care provider on file. Electrophysiologist:   Clinic Note: No chief complaint on file.   HPI:    [name] is a 64 y.o. female who is being seen today for the evaluation of [place] at the request of Hoyt Koch, *.  [name] was seen in July by Dr. Sharlet Salina for complaints of leg swelling and pain.  She was started on Xarelto for possible DVT, and referred to vascular surgery.  She was seen by Dr. Carlis Abbott from Vascular Surgery with concerns of left leg swelling.  She is apparently had a history of DVT in that leg (in the past noted bilateral status post IVC filter in 2009 -> no longer on anticoagulation).  Dr. Carlis Abbott noted that she was complaining of pain pain and swelling in both legs now for several months the pain is burning along the medial thigh and calf.  She has been treated for cellulitis with no improvement.  Also noted to have been treated with "fluid pills "for "documented CHF "however there is been no improvement.  --> Dr. Carlis Abbott reviewed the venous Dopplers that have been done and confirmed that there was no evidence of a DVT on the left side.  She was then referred for evaluation for venous reflux which revealed mostly deep venous reflux in the common femoral vein with only a focal superficial area.  Not really amenable for treatment.    Recommendation was conservative therapy with thigh-high compression stockings 20-30 mmHg  Recent Hospitalizations / Urgent Visits  Was evaluated on October 2 by Dr. Sharlet Salina via telemedicine for possible pneumonia and was referred for Covid testing.--Ruled out.  Noted feeling sick, sore throat, cough neck pain.  Feeling poorly.  SARS-CoV-2 -not detected/negative   Reviewed  CV studies:    The following studies were reviewed today: (if available, images/films reviewed: From Epic Chart or Care  Everywhere) .  Marland Kitchen Monitor January 2021:  Predominant rhythm is sinus with minimum heart rate of 48 bpm, maximum heart rate 124 bpm. Average 74 bpm.  Frequent (37) short runs of PAT/SVT -> episodes from 3 to 15 seconds, fastest 171 bpm (4 beats).  Otherwise rare PACs and PVCs.  Symptoms noted with PACs and PVCs.-Chest pain/pressure, Short of breath  Most slow heart rates in sinus bradycardia were during hours of sleep.  Neither of the multiple short bursts of PAT were recorded on the diary of symptomatic  I reviewed from February 2015: Nonischemic with normal EF.  Interval History:   [name] presents here today with a plethora of complaints.  She indicates to me that she had gastric bypass surgery in 2003 and was over 400 pounds.  At that time she had was on CPAP for OSA and was very deconditioned.  She lost a significant ma  tter weight and was able to come off the CPAP.  And is doing well.  She also reports that may be in 2009 she had bilateral lower extremity DVTs and had an IVC filter placed.  Do not have any details on this. At baseline, she is ambulatory, but walks with a cane.  Also sometimes has to use a walker for longer distances due to leg pain.  She takes Lyrica for neuropathic pain  Tells me that she previously was able to walk her dog around the block, and now she is not able to go even across the street without getting short  of breath and would have some chest tightness.  She tells me that over the last year or so her weight has started to go back up again in the past year she has gained maybe 20 pounds despite not "eating a whole lot ".  She has a diagnosis of CHF although I do not see any documentation of a cardiac evaluation besides a Myoview in 2015 which showed normal EF and no ischemia.  She says that she sleeps on 2 pillows and has off and on symptoms of waking up short of breath.  She tells me that she has had left greater than right leg swelling for the  last several months and has been treated with diuretics and antibiotics etc.  She tells me now that she takes Lasix maybe once or twice a month. Reviewing her chart she has had 3 venous Dopplers done mostly noting deep vein reflux as indicated by Dr. Carlis Abbott.  She is currently wearing compression stockings.  In addition to the swelling and some mild PND symptoms, she says that off-and-on she will have episodes of chest pressure and tightness.  This can occur with just sitting, but can also occur with activity.  That may make it worse.  She clearly has exertional dyspnea now with doing most activities.  She has had spells where she feels her heart rate going fast.  Usually that occurs at night can happen 2-3 times a week maybe last about 5 minute.  While these make her feel a little bit unusual and have some discomfort in her chest she does not have any syncope.  She may get dizzy and lightheaded, near syncope but no passout spells.  No TIA or amaurosis fugax.  CV Review of Symptoms (Summary) Cardiovascular ROS: positive for - chest pain, dyspnea on exertion, edema, irregular heartbeat, orthopnea, palpitations, paroxysmal nocturnal dyspnea, rapid heart rate, shortness of breath and Noted above negative for - loss of consciousness or TIA since amaurosis fugax.  Does not really walk enough to note claudication  The patient does not have symptoms concerning for COVID-19 infection (fever, chills, cough, or new shortness of breath).  She has many of the symptoms, but not in conjunction together, she is also recently ruled out for Covid The patient is practicing social distancing. ++ Masking.  Does not usually go out for groceries/shopping.    REVIEWED OF SYSTEMS   A comprehensive ROS was performed. Review of Systems  Constitutional: Positive for malaise/fatigue. Negative for weight loss (Weight gain).       No recent fevers or chills.  HENT: Negative for congestion and nosebleeds.   Respiratory: Positive  for shortness of breath. Negative for cough and wheezing.   Gastrointestinal: Negative for blood in stool and melena.  Genitourinary: Negative for hematuria.  Musculoskeletal: Positive for back pain and joint pain. Negative for falls.  Neurological: Positive for dizziness and focal weakness (Left leg and arm). Negative for headaches.  Endo/Heme/Allergies: Negative for environmental allergies.  Psychiatric/Behavioral: Negative for memory loss. The patient is nervous/anxious and has insomnia (Not sleeping well.).   All other systems reviewed and are negative.  No flowsheet data found.    03/13/2019 2:00 PM  How likely are you to doze off or fall asleep in the following situations?   Sitting and reading? High chance of dozing - 3  Watching TV? High chance of dozing - 3  Sitting, inactive in a public place (e.g. a theatre or a meeting)? Moderate chance of dozing -2  As a  passenger in a car for an hour without a break? High chance of dozing -3  Lying down to rest in the afternoon when circumstances permit? High chance of dozing - 3  Sitting and talking to someone? Slight chance of dozing - 1  Sitting quietly after a lunch without alcohol? High chance of dozing - 3  In a car, while stopped for a few minutes in traffic?  Moderate chance of dozing - 2  Epworth Total Score 20  Document the patient's comorbid illnesses and symptoms of sleep apnea   The patient has the following comorbid illnesses: -- Not diagnosed  The patient has the following symptoms of sleep apnea: Snoring; Daytime Sleepiness; Un-refreshing Sleep; Morning Headaches; Nocturia; Decreased Sexual Desire    I have reviewed and (if needed) personally updated the patient's problem list, medications, allergies, past medical and surgical history, social and family history.   PAST MEDICAL HISTORY   Past Medical History:  Diagnosis Date  . Anemia    takes Ferrous Sulfate daily  . Anxiety    takes Citalopram daily  . Arthritis    . Asthma    2004-prior to gastric bypass and no problems since  . CHF (congestive heart failure) (HCC)    takes Lasix daily as needed  . Chronic back pain    spondylolisthesis/stenosis/radiculopathy  . Complication of anesthesia yrs ago   slow to wake up  . Depression   . Diabetes ([place])   . DVT (deep venous thrombosis) ([place]) 01/17/2013   past hx. -tx.5-6 yrs ago bilateral legs, occ. sporadic swelling, has IVC filter implanted  . Dysrhythmia    "heart tends to flutter"  . Fibromyalgia   . Fracture    right foot and is in a cam boot  . Gallstones   . GERD (gastroesophageal reflux disease)    hx of-no meds now  . Heart murmur   . History of bronchitis 2012 or 2013  . History of colon polyps   . Hypertension    takes Metoprolol daily  . Insomnia    takes Melatonin daily  . Pelvic floor dysfunction   . Peripheral neuropathy   . Pneumonia 90's   hx of  . S/P gastric bypass 2003  . Sleep apnea    no cpap used in many yrs after weight lost-no machine now  . Urinary frequency   . Urinary urgency   . Vitamin D deficiency      PAST SURGICAL HISTORY   Past Surgical History:  Procedure Laterality Date  . BALLOON DILATION N/A 01/31/2013   Procedure: BALLOON DILATION;  Surgeon: Arta Silence, MD;  Location: [facility];  Service: Endoscopy;  Laterality: N/A;  . CHOLECYSTECTOMY  2007  . COLONOSCOPY    . DILATION AND CURETTAGE OF UTERUS  yrs ago  . ESOPHAGOGASTRODUODENOSCOPY (EGD) WITH PROPOFOL  03/01/2012   Procedure: ESOPHAGOGASTRODUODENOSCOPY (EGD) WITH PROPOFOL;  Surgeon: Arta Silence, MD;  Location: [facility];  Service: Endoscopy;  Laterality: N/A;  . ESOPHAGOGASTRODUODENOSCOPY (EGD) WITH PROPOFOL N/A 01/31/2013   Procedure: ESOPHAGOGASTRODUODENOSCOPY (EGD) WITH PROPOFOL;  Surgeon: Arta Silence, MD;  Location: [facility];  Service: Endoscopy;  Laterality: N/A;  . ESOPHAGOGASTRODUODENOSCOPY (EGD) WITH PROPOFOL N/A 09/02/2015   Procedure: ESOPHAGOGASTRODUODENOSCOPY  (EGD) WITH PROPOFOL;  Surgeon: Gatha Mayer, MD;  Location: [facility];  Service: Endoscopy;  Laterality: N/A;  . GASTRIC BY-PASS  2004  . HERNIA REPAIR  2005  . INSERTION OF VENA CAVA FILTER  01-17-13   inserted 2004- "abdomen"  . LIGAMENT REPAIR Right  1987   Rt. knee scope  . MAXIMUM ACCESS (MAS)POSTERIOR LUMBAR INTERBODY FUSION (PLIF) 2 LEVEL N/A 06/07/2014   Procedure: L4-5 L5-S1 FOR MAXIMUM ACCESS (MAS) POSTERIOR LUMBAR INTERBODY FUSION ;  Surgeon: Erline Levine, MD;  Location: [facility];  Service: Neurosurgery;  Laterality: N/A;  L4-5 L5-S1 FOR MAXIMUM ACCESS (MAS) POSTERIOR LUMBAR INTERBODY FUSION   . TONSILLECTOMY     as child   . Venous Dopplers for both DVT and reflux reviewed. . January 10, 2019: Abnormal reflux noted in the common femoral vein and short segment of the greater saphenous vein at the proximal thigh noted on the left.  No thrombus noted.  MEDICATIONS/ALLERGIES   No outpatient medications have been marked as taking for the 05/17/19 encounter (Appointment) with Leonie Man, MD.    Allergies  Allergen Reactions  . Carafate [Sucralfate] Hives    Other reaction(s): Angioedema (ALLERGY/intolerance) hives scratching  . Sulfonamide Derivatives Rash    REACTION: Syncope     SOCIAL HISTORY/FAMILY HISTORY   Social History   Tobacco Use  . Smoking status: Former Smoker    Packs/day: 0.50    Years: 10.00    Pack years: 5.00    Types: Cigarettes    Quit date: 04/19/2009    Years since quitting: 10.0  . Smokeless tobacco: Never Used  Substance Use Topics  . Alcohol use: Yes    Alcohol/week: 0.0 standard drinks    Comment: occ. social- wine-1 drink monthly  . Drug use: No   Social History   Social History Narrative   Widow.  No kids.  Lives with her mother who is 22.  She does walk with a cane.  She is a retired Investment banker, corporate   She quit smoking in 2003    Family History family history includes Alzheimer's disease in her father and  paternal grandmother; Atrial fibrillation in her mother; Diabetes in her maternal grandmother and mother; Healthy in her sister; Heart disease in her father and maternal grandmother; Hypertension in her father, maternal grandfather, mother, and paternal grandmother; Kidney disease in her brother; Prostate cancer in her father; Stroke (age of onset: 33) in her maternal grandfather.   OBJCTIVE -PE, EKG, labs   Wt Readings from Last 3 Encounters:  05/11/19 268 lb (121.6 kg)  03/13/19 267 lb (121.1 kg)  01/02/19 271 lb (122.9 kg)    Physical Exam: There were no vitals taken for this visit. Physical Exam  Constitutional: She is oriented to person, place, and time. She appears well-developed and well-nourished. No distress.  Morbidly obese, well-groomed.  Walks in with a cane with a slow unsteady gait.  HENT:  Head: Normocephalic and atraumatic.  Eyes: EOM are normal.  Neck: No hepatojugular reflux and no JVD present. Carotid bruit is not present.  Cardiovascular: Normal rate, regular rhythm, S1 normal and S2 normal.  No extrasystoles are present. PMI is not displaced (Unable to palpate). Exam reveals distant heart sounds and decreased pulses (1+ pedal pulses, difficult to assess because of tense edema and support stockings). Exam reveals no gallop, no S4 and no friction rub.  Murmur (Sem1/6 at RUSB.) heard. Pulmonary/Chest: She exhibits tenderness (Reproducible on palpation parasternal chest discomfort.).  Somewhat distant breath sounds without wheezes rales or rhonchi.  Shallow effort.  Abdominal: Soft. Bowel sounds are normal. She exhibits no distension. There is no abdominal tenderness.  Unable to assess HSM because of body habitus.  Musculoskeletal:        General: Normal range of motion.  Cervical back: Normal range of motion and neck supple.     Comments: Notable for only 4/5 grip and upper arms strength on the left.  5/5 right  Neurological: She is alert and oriented to person,  place, and time. No cranial nerve deficit.  Skin: She is not diaphoretic.  Psychiatric: She has a normal mood and affect. Her behavior is normal. Judgment and thought content normal.  Vitals reviewed.    Adult ECG Report  Rate: 69 ;  Rhythm: normal sinus rhythm and Normal axis, intervals and durations.;   Narrative Interpretation: Normal EKG  Recent Labs:    Lab Results  Component Value Date   CHOL 146 01/02/2019   HDL 73.40 01/02/2019   LDLCALC 60 01/02/2019   TRIG 66.0 01/02/2019   CHOLHDL 2 01/02/2019   Lab Results  Component Value Date   CREATININE 0.76 05/01/2019   BUN 17 05/01/2019   NA 145 (H) 05/01/2019   K 4.7 05/01/2019   CL 115 (H) 05/01/2019   CO2 19 (L) 05/01/2019    ASSESSMENT/PLAN    Problem List Items Addressed This Visit    None       COVID-19 Education: The signs and symptoms of COVID-19 were discussed with the patient and how to seek care for testing (follow up with PCP or arrange E-visit).   The importance of social distancing was discussed today.  I spent a total of 30 minutes with the patient and chart review. >  50% of the time was spent in direct patient consultation.  Additional time spent with chart review (studies, outside notes, etc): 25 Total Time: 55 min  --> Extensive medical history reviewed in the chart including several clinic notes, consultation notes and prior studies.  Entire past medical history was updated.   Current medicines are reviewed at length with the patient today.  (+/- concerns) n/a   Patient Instructions / Medication Changes & Studies & Tests Ordered   Patient Instructions  Medication Instructions:   CHANGE  LASIX TO  TAKING 3 DAYS A WEEK   *If you need a refill on your cardiac medications before your next appointment, please call your pharmacy*  Lab Work: SEE INSTRUCTIONS SHEET     Testing/Procedures:  No orders of the defined types were placed in this encounter.    Glenetta Hew, M.D.,  M.S. Interventional Cardiologist   Pager # [phone] Phone # [phone] [address]. [place],  [address]   Thank you for choosing [facility] at [facility]!!

## 2019-05-25 ENCOUNTER — Other Ambulatory Visit: Payer: Self-pay | Admitting: Internal Medicine

## 2019-05-25 NOTE — Telephone Encounter (Signed)
Filled 04/24/2019 Tramadol Hcl 50 Mg Tablet 120#  Last OV 01/19/19 Next OV n/s

## 2019-05-27 ENCOUNTER — Ambulatory Visit: Payer: Medicare HMO

## 2019-05-28 DIAGNOSIS — E119 Type 2 diabetes mellitus without complications: Secondary | ICD-10-CM | POA: Diagnosis not present

## 2019-05-28 DIAGNOSIS — H269 Unspecified cataract: Secondary | ICD-10-CM | POA: Diagnosis not present

## 2019-05-28 DIAGNOSIS — H52209 Unspecified astigmatism, unspecified eye: Secondary | ICD-10-CM | POA: Diagnosis not present

## 2019-05-28 DIAGNOSIS — H5203 Hypermetropia, bilateral: Secondary | ICD-10-CM | POA: Diagnosis not present

## 2019-05-28 DIAGNOSIS — H524 Presbyopia: Secondary | ICD-10-CM | POA: Diagnosis not present

## 2019-06-10 ENCOUNTER — Ambulatory Visit: Payer: Medicare HMO | Attending: Internal Medicine

## 2019-06-10 DIAGNOSIS — Z23 Encounter for immunization: Secondary | ICD-10-CM | POA: Insufficient documentation

## 2019-06-10 NOTE — Progress Notes (Signed)
   Covid-19 Vaccination Clinic  Name:  [name]    MRN: [id number] DOB: March 12, 1956  06/10/2019  [name] was observed post Covid-19 immunization for 15 minutes without incidence. She was provided with Vaccine Information Sheet and instruction to access the V-Safe system.   [name] was instructed to call 911 with any severe reactions post vaccine: Marland Kitchen Difficulty breathing  . Swelling of your face and throat  . A fast heartbeat  . A bad rash all over your body  . Dizziness and weakness    Immunizations Administered    Name Date Dose VIS Date Route   Moderna COVID-19 Vaccine 06/10/2019  3:32 PM 0.5 mL 03/20/2019 Intramuscular   Manufacturer: Moderna   Lot: [id number]   [id number]

## 2019-06-11 ENCOUNTER — Telehealth: Payer: Self-pay | Admitting: Family Medicine

## 2019-06-11 NOTE — Telephone Encounter (Signed)
Left patient voicemail that we can switch visit to virtual visit.

## 2019-06-11 NOTE — Telephone Encounter (Signed)
Pt would like for her recheck 2/25 to be done virtually, she has improved since last injection. Just wanted to be sure that was OK with provider.

## 2019-06-13 ENCOUNTER — Ambulatory Visit: Payer: Medicare HMO

## 2019-06-14 ENCOUNTER — Encounter: Payer: Self-pay | Admitting: Family Medicine

## 2019-06-14 ENCOUNTER — Ambulatory Visit (INDEPENDENT_AMBULATORY_CARE_PROVIDER_SITE_OTHER): Payer: Medicare HMO | Admitting: Family Medicine

## 2019-06-14 ENCOUNTER — Ambulatory Visit: Payer: Medicare HMO | Admitting: Family Medicine

## 2019-06-14 DIAGNOSIS — M5416 Radiculopathy, lumbar region: Secondary | ICD-10-CM

## 2019-06-14 DIAGNOSIS — I10 Essential (primary) hypertension: Secondary | ICD-10-CM | POA: Diagnosis not present

## 2019-06-14 NOTE — Progress Notes (Signed)
Virtual Visit via Video Note  I connected with [name] on 06/14/19 at  4:15 PM EST by a video enabled telemedicine application and verified that I am speaking with the correct person using two identifiers.  Location: Patient: Patient is at home with family member  Provider: In office   I discussed the limitations of evaluation and management by telemedicine and the availability of in person appointments. The patient expressed understanding and agreed to proceed.  History of Present Illness: 64 year old female who was having back and hip pain.  No degenerative disc disease lumbar spine.  Patient is having worsening symptoms she thinks of the back of the moment.  Sometimes for more of the left hip pain do seem to be more of the gluteal tendon and states that that is feeling approximately 80 to 75% better at this moment.  States that the back does not seem better.  Feels like she is having worsening balance.  Cannot do the exercises secondary to the balance.  Reviewed patient's chart and the entirety.  Patient's in 2016 did have a nerve conduction study done.  This was reviewed by me showing the patient did have chronic lumbar radicular symptoms with L5 on S1 but also did have peripheral neuropathy.  Moderate in severity.      Observations/Objective: Alert and oriented x3 seems comfortable.   Assessment and Plan: Patient with known lumbar radiculopathy but also peripheral neuropathy.  Repeat nerve conduction study that I think will be beneficial.  We looked over patient's previous MRIs and has had surgical intervention of the back.  Do feel a repeat x-ray of the back would be beneficial as well with the idea that possible MRI may be necessary.  Discussed posture and ergonomics discussed that we can repeat injection again in the gluteal tendon if necessary but would like to wait at least 10 weeks.  Patient is doing relatively better with that at the moment.  Patient will increase activity  slowly.   Follow Up Instructions: 6 weeks in the office but will discuss the possibility of advanced imaging.      I discussed the assessment and treatment plan with the patient. The patient was provided an opportunity to ask questions and all were answered. The patient agreed with the plan and demonstrated an understanding of the instructions.   The patient was advised to call back or seek an in-person evaluation if the symptoms worsen or if the condition fails to improve as anticipated.  I provided 35 minutes of review as well as discussing with patient time during this encounter.   Lyndal Pulley, DO

## 2019-06-15 ENCOUNTER — Other Ambulatory Visit: Payer: Self-pay

## 2019-06-15 DIAGNOSIS — M5416 Radiculopathy, lumbar region: Secondary | ICD-10-CM

## 2019-07-03 ENCOUNTER — Other Ambulatory Visit: Payer: Self-pay

## 2019-07-03 ENCOUNTER — Ambulatory Visit (INDEPENDENT_AMBULATORY_CARE_PROVIDER_SITE_OTHER): Payer: Medicare HMO

## 2019-07-03 ENCOUNTER — Ambulatory Visit (INDEPENDENT_AMBULATORY_CARE_PROVIDER_SITE_OTHER): Payer: Medicare HMO | Admitting: Neurology

## 2019-07-03 DIAGNOSIS — M5416 Radiculopathy, lumbar region: Secondary | ICD-10-CM | POA: Diagnosis not present

## 2019-07-03 DIAGNOSIS — G609 Hereditary and idiopathic neuropathy, unspecified: Secondary | ICD-10-CM

## 2019-07-03 DIAGNOSIS — M48061 Spinal stenosis, lumbar region without neurogenic claudication: Secondary | ICD-10-CM | POA: Diagnosis not present

## 2019-07-04 NOTE — Procedures (Signed)
[facility]  [address], [place]  [place], [place] [address] Tel: [phone] Fax:  [phone] Test Date:  07/03/2019  Patient: [name] DOB: 20-Apr-1955 Physician: Narda Amber, DO  Sex: Female Height: 5\' 7"  Ref Phys: Hulan Saas, DO  ID#: [id number] Temp: 32.0C Technician:    Patient Complaints: This is a 64 year old female referred for evaluation of bilateral leg pain and paresthesias.  NCV & EMG Findings: Extensive electrodiagnostic testing of the right lower extremity and additional studies of the left shows:  1. Bilateral sural sensory responses are absent.  Bilateral superficial peroneal sensory responses are within normal limits.  Compared to prior study on 03/06/2015 sensory responses are stable. 2. Bilateral peroneal motor responses are absent at the extensor digitorum brevis, and showed reduced amplitude at the tibialis anterior (R2.6, L2.4 mV).  Bilateral tibial motor responses showed reduced amplitude (R3.6, L2.0 mV).  There has been interval loss of motor amplitudes as compared to prior study on 03/06/2015. 3. Bilateral tibial H reflex studies are absent. 4. Chronic motor axonal loss changes are seen affecting the S1 myotomes bilaterally, without accompanying active denervation.  Previously seen chronic motor axonal loss changes affecting the L2-4 myotomes is not present.  Impression: 1. The electrophysiologic findings are consistent with a sensorimotor axonal polyneuropathy affecting the lower extremities, moderate in degree electrically with mild progression. 2. Chronic S1 radiculopathy affecting bilateral lower extremities, moderate, stable. 3. As compared to study on 03/06/2015, bilateral L2-4 radiculopathy is no longer present.    ___________________________ Narda Amber, DO    Nerve Conduction Studies Anti Sensory Summary Table   Stim Site NR Peak (ms) Norm Peak (ms) P-T Amp (V) Norm P-T Amp  Left Sup Peroneal Anti Sensory (Ant  Lat Mall)  32C  12 cm    2.5 <4.6 5.7 >3  Right Sup Peroneal Anti Sensory (Ant Lat Mall)  32C  12 cm    2.6 <4.6 7.7 >3  Left Sural Anti Sensory (Lat Mall)  32C  Calf NR  <4.6  >3  Right Sural Anti Sensory (Lat Mall)  32C  Calf NR  <4.6  >3   Motor Summary Table   Stim Site NR Onset (ms) Norm Onset (ms) O-P Amp (mV) Norm O-P Amp Site1 Site2 Delta-0 (ms) Dist (cm) Vel (m/s) Norm Vel (m/s)  Left Peroneal Motor (Ext Dig Brev)  32C  Ankle NR  <6.0  >2.5 B Fib Ankle  0.0  >40  B Fib NR     Poplt B Fib  0.0  >40  Poplt NR            Right Peroneal Motor (Ext Dig Brev)  32C  Ankle NR  <6.0  >2.5 B Fib Ankle  0.0  >40  B Fib NR     Poplt B Fib  0.0  >40  Poplt NR            Left Peroneal TA Motor (Tib Ant)  32C  Fib Head    2.8 <4.5 2.4 >3 Poplit Fib Head 1.4 8.0 57 >40  Poplit    4.2  2.2         Right Peroneal TA Motor (Tib Ant)  32C  Fib Head    3.7 <4.5 2.6 >3 Poplit Fib Head 1.4 8.0 57 >40  Poplit    5.1  2.2         Left Tibial Motor (Abd Hall Brev)  32C  Ankle    4.1 <6.0 2.0 >4 Knee  Ankle 7.0 42.0 60 >40  Knee    11.1  0.9         Right Tibial Motor (Abd Hall Brev)  32C  Ankle    4.5 <6.0 3.6 >4 Knee Ankle 7.3 42.0 58 >40  Knee    11.8  1.2          H Reflex Studies   NR H-Lat (ms) Lat Norm (ms) L-R H-Lat (ms)  Left Tibial (Gastroc)  32C  NR  <35   Right Tibial (Gastroc)  32C  NR  <35    EMG   Side Muscle Ins Act Fibs Psw Fasc Number Recrt Dur Dur. Amp Amp. Poly Poly. Comment  Right AntTibialis Nml Nml Nml Nml Nml Nml Nml Nml Nml Nml Nml Nml N/A  Right Gastroc Nml Nml Nml Nml 1- Rapid Some 1+ Some 1+ Nml Nml N/A  Right Flex Dig Long Nml Nml Nml Nml Nml Nml Nml Nml Nml Nml Nml Nml N/A  Right RectFemoris Nml Nml Nml Nml Nml Nml Nml Nml Nml Nml Nml Nml N/A  Right GluteusMed Nml Nml Nml Nml Nml Nml Nml Nml Nml Nml Nml Nml N/A  Right BicepsFemS Nml Nml Nml Nml 1- Rapid Some 1+ Some 1+ Nml Nml N/A  Left AntTibialis Nml Nml Nml Nml Nml Nml Nml Nml Nml Nml Nml  Nml N/A  Left Gastroc Nml Nml Nml Nml 1- Rapid Some 1+ Some 1+ Nml Nml N/A  Left RectFemoris Nml Nml Nml Nml Nml Nml Nml Nml Nml Nml Nml Nml N/A  Left Flex Dig Long Nml Nml Nml Nml Nml Nml Nml Nml Nml Nml Nml Nml N/A  Left BicepsFemS Nml Nml Nml Nml 1- Rapid Some 1+ Some 1+ Nml Nml N/A  Left GluteusMed Nml Nml Nml Nml Nml Nml Nml Nml Nml Nml Nml Nml N/A      Waveforms:

## 2019-07-05 ENCOUNTER — Other Ambulatory Visit: Payer: Self-pay

## 2019-07-05 ENCOUNTER — Ambulatory Visit (INDEPENDENT_AMBULATORY_CARE_PROVIDER_SITE_OTHER): Payer: Medicare HMO | Admitting: Internal Medicine

## 2019-07-05 ENCOUNTER — Encounter: Payer: Self-pay | Admitting: Internal Medicine

## 2019-07-05 DIAGNOSIS — R198 Other specified symptoms and signs involving the digestive system and abdomen: Secondary | ICD-10-CM

## 2019-07-05 MED ORDER — ONDANSETRON HCL 4 MG PO TABS: 4.0000 mg | ORAL_TABLET | Freq: Three times a day (TID) | ORAL | 0 refills | Status: DC | PRN

## 2019-07-05 MED ORDER — TRAZODONE HCL 50 MG PO TABS: 50.0000 mg | ORAL_TABLET | Freq: Every evening | ORAL | 3 refills | Status: DC | PRN

## 2019-07-05 MED ORDER — PANTOPRAZOLE SODIUM 40 MG PO TBEC: 40.0000 mg | DELAYED_RELEASE_TABLET | Freq: Every day | ORAL | 3 refills | Status: DC

## 2019-07-05 NOTE — Progress Notes (Signed)
Virtual Visit via Video Note  I connected with [name] on 07/05/19 at  2:00 PM EDT by a video enabled telemedicine application and verified that I am speaking with the correct person using two identifiers.  The patient and the provider were at separate locations throughout the entire encounter.   I discussed the limitations of evaluation and management by telemedicine and the availability of in person appointments. The patient expressed understanding and agreed to proceed. The patient and the provider were the only parties present for the visit unless noted in HPI below.  History of Present Illness: The patient is a 64 y.o. female with visit for GI illness with diarrhea and nausea. She had dry heaving on the first day. Since that time she is mostly just nauseous. She has been drinking some pedialyte and fluids. Diarrhea mostly watery and no blood in diarrhea. Has had both covid-19 vaccines. Denies sick contacts or other contacts with same diarrhea. She has taken imodium otc and the diarrhea is slowing down a lot today. She is very tired and poor energy and appetite. Started 3-4 days ago. Denies fevers or chills. Also in the last month having a lot more gerd and taking otc pepto bismol when needed.   Observations/Objective: Appearance: tired, breathing appears normal, casual grooming, abdomen does not appear distended, throat normal, memory normal, mental status is A and O times 3  Assessment and Plan: See problem oriented charting  Follow Up Instructions: keep hydrated, zofran for nausea and protonix for GERD.  I discussed the assessment and treatment plan with the patient. The patient was provided an opportunity to ask questions and all were answered. The patient agreed with the plan and demonstrated an understanding of the instructions.   The patient was advised to call back or seek an in-person evaluation if the symptoms worsen or if the condition fails to improve as  anticipated.  Hoyt Koch, MD

## 2019-07-05 NOTE — Assessment & Plan Note (Signed)
Suspect viral gastroenteritis. Push fluids and BRAT diet. Zofran for nausea prn and protonix for GERD.

## 2019-07-09 ENCOUNTER — Encounter: Payer: Self-pay | Admitting: Internal Medicine

## 2019-07-10 ENCOUNTER — Ambulatory Visit (INDEPENDENT_AMBULATORY_CARE_PROVIDER_SITE_OTHER): Payer: Medicare HMO | Admitting: Family

## 2019-07-10 DIAGNOSIS — J209 Acute bronchitis, unspecified: Secondary | ICD-10-CM

## 2019-07-10 DIAGNOSIS — J019 Acute sinusitis, unspecified: Secondary | ICD-10-CM | POA: Diagnosis not present

## 2019-07-10 MED ORDER — DOXYCYCLINE HYCLATE 100 MG PO TABS: 100.0000 mg | ORAL_TABLET | Freq: Two times a day (BID) | ORAL | 0 refills | Status: DC

## 2019-07-10 MED ORDER — ALBUTEROL SULFATE HFA 108 (90 BASE) MCG/ACT IN AERS: 2.0000 | INHALATION_SPRAY | Freq: Four times a day (QID) | RESPIRATORY_TRACT | 0 refills | Status: DC | PRN

## 2019-07-10 NOTE — Progress Notes (Signed)
[name] is a 64 y.o. female with the following history as recorded in Epic[employer]:  Patient Active Problem List   Diagnosis Date Noted  . Gastrointestinal complaint 07/05/2019  . Muscle strain of gluteal region, right, initial encounter 05/11/2019  . Dyspnea on exertion 03/13/2019  . Rapid heartbeat 03/13/2019  . Systolic ejection murmur 86/76/7209  . Suspected COVID-19 virus infection 01/19/2019  . Leg swelling 10/09/2018  . Insomnia 05/19/2018  . Frequent refractory urinary tract infections 05/05/2018  . Right leg pain 12/16/2017  . History of deep vein thrombosis (DVT) of lower extremity 12/12/2017  . Cavovarus deformity of foot, acquired, unspecified laterality 04/07/2017  . S/P gastric bypass 08/18/2016  . Muscle cramps 07/09/2016  . Adjustment disorder with mixed anxiety and depressed mood 03/05/2016  . Cervical disc disorder with radiculopathy of cervical region 02/27/2016  . Degenerative arthritis of right knee 02/27/2016  . Right knee pain 02/24/2016  . Left shoulder pain 02/24/2016  . Routine general medical examination at a health care facility 12/12/2015  . Schatzki's ring   . Lumbar radiculopathy 03/05/2015  . Peripheral neuropathy 01/17/2015  . Chest pain with moderate risk for cardiac etiology 05/21/2013  . Fibromyalgia 02/21/2011  . Essential hypertension 12/04/2006  . Hx of diabetes mellitus 10/20/2006  . Morbid obesity ([place]) 10/20/2006  . MDD (major depressive disorder) ([place]) 10/20/2006  . OBSTRUCTIVE SLEEP APNEA 10/20/2006  . OSTEOARTHRITIS 10/20/2006    Current Outpatient Medications  Medication Sig Dispense Refill  . ACCU-CHEK SOFTCLIX LANCETS lancets CHECK BLOOD SUGARS TWICE DAILY 200 each 2  . albuterol (VENTOLIN HFA) 108 (90 Base) MCG/ACT inhaler Inhale 2 puffs into the lungs every 6 (six) hours as needed for wheezing or shortness of breath. 6.7 g 0  . Alcohol Swabs (B-D SINGLE USE SWABS REGULAR) PADS USE TO WIPE AREA TO CHECK BLOOD SUGAR  DAILY  100 each 2  . amLODipine (NORVASC) 10 MG tablet TAKE 1 TABLET EVERY DAY 90 tablet 1  . BLACK COHOSH PO Take 1 tablet by mouth daily.    . Blood Glucose Calibration (ACCU-CHEK AVIVA) SOLN Use as directed 3 each 3  . Blood Glucose Monitoring Suppl (ACCU-CHEK AVIVA PLUS) w/Device KIT USE AS DIRECTED  TO CHECK BLOOD SUGAR EVERY DAY 1 kit 0  . doxycycline (VIBRA-TABS) 100 MG tablet Take 1 tablet (100 mg total) by mouth 2 (two) times daily. 20 tablet 0  . DULoxetine (CYMBALTA) 30 MG capsule TAKE 1 TO 2 CAPSULES EVERY DAY 180 capsule 1  . furosemide (LASIX) 40 MG tablet TAKE 1 TABLET DAILY AS NEEDED FOR EDEMA 90 tablet 3  . glucose blood (ACCU-CHEK AVIVA PLUS) test strip 1 each by Other route 2 (two) times daily. Use to check blood sugars twice a day 200 each 2  . metoprolol tartrate (LOPRESSOR) 50 MG tablet Take 2 tablets (100 mg total) by mouth once for 1 dose. TAKE ONE HOUR PRIOR TO  SCHEDULE CARDIAC TEST 2 tablet 0  . Multiple Vitamins-Minerals (MULTIVITAMINS THER. W/MINERALS) TABS Take 1 tablet by mouth daily.     . ondansetron (ZOFRAN) 4 MG tablet Take 1 tablet (4 mg total) by mouth every 8 (eight) hours as needed for nausea or vomiting. 20 tablet 0  . pantoprazole (PROTONIX) 40 MG tablet Take 1 tablet (40 mg total) by mouth daily. 30 tablet 3  . potassium chloride SA (K-DUR) 20 MEQ tablet TAKE 1 TABLET EVERY DAY 90 tablet 0  . pregabalin (LYRICA) 300 MG capsule Take 1 capsule (300 mg total)  by mouth 2 (two) times daily. 60 capsule 5  . rivaroxaban (XARELTO) 20 MG TABS tablet Take 1 tablet (20 mg total) by mouth daily with supper. 90 tablet 1  . traMADol (ULTRAM) 50 MG tablet TAKE 1 TABLET(50 MG) BY MOUTH FOUR TIMES DAILY 120 tablet 5  . traZODone (DESYREL) 50 MG tablet Take 1 tablet (50 mg total) by mouth at bedtime as needed for sleep. 30 tablet 3   No current facility-administered medications for this visit.    Allergies: Carafate [sucralfate] and Sulfonamide derivatives  Past Medical  History:  Diagnosis Date  . Anemia    takes Ferrous Sulfate daily  . Anxiety    takes Citalopram daily  . Arthritis   . Asthma    2004-prior to gastric bypass and no problems since  . CHF (congestive heart failure) ([facility])    takes Lasix daily as needed  . Chronic back pain    spondylolisthesis/stenosis/radiculopathy  . Complication of anesthesia yrs ago   slow to wake up  . Depression   . Diabetes ([place])   . DVT (deep venous thrombosis) ([place]) 01/17/2013   past hx. -tx.5-6 yrs ago bilateral legs, occ. sporadic swelling, has IVC filter implanted  . Dysrhythmia    "heart tends to flutter"  . Fibromyalgia   . Fracture    right foot and is in a cam boot  . Gallstones   . GERD (gastroesophageal reflux disease)    hx of-no meds now  . Heart murmur   . History of bronchitis 2012 or 2013  . History of colon polyps   . Hypertension    takes Metoprolol daily  . Insomnia    takes Melatonin daily  . Pelvic floor dysfunction   . Peripheral neuropathy   . Pneumonia 90's   hx of  . S/P gastric bypass 2003  . Sleep apnea    no cpap used in many yrs after weight lost-no machine now  . Urinary frequency   . Urinary urgency   . Vitamin D deficiency     Past Surgical History:  Procedure Laterality Date  . BALLOON DILATION N/A 01/31/2013   Procedure: BALLOON DILATION;  Surgeon: Arta Silence, MD;  Location: [facility];  Service: Endoscopy;  Laterality: N/A;  . CHOLECYSTECTOMY  2007  . COLONOSCOPY    . DILATION AND CURETTAGE OF UTERUS  yrs ago  . ESOPHAGOGASTRODUODENOSCOPY (EGD) WITH PROPOFOL  03/01/2012   Procedure: ESOPHAGOGASTRODUODENOSCOPY (EGD) WITH PROPOFOL;  Surgeon: Arta Silence, MD;  Location: [facility];  Service: Endoscopy;  Laterality: N/A;  . ESOPHAGOGASTRODUODENOSCOPY (EGD) WITH PROPOFOL N/A 01/31/2013   Procedure: ESOPHAGOGASTRODUODENOSCOPY (EGD) WITH PROPOFOL;  Surgeon: Arta Silence, MD;  Location: [facility];  Service: Endoscopy;  Laterality: N/A;  .  ESOPHAGOGASTRODUODENOSCOPY (EGD) WITH PROPOFOL N/A 09/02/2015   Procedure: ESOPHAGOGASTRODUODENOSCOPY (EGD) WITH PROPOFOL;  Surgeon: Gatha Mayer, MD;  Location: [facility];  Service: Endoscopy;  Laterality: N/A;  . GASTRIC BY-PASS  2004  . HERNIA REPAIR  2005  . INSERTION OF VENA CAVA FILTER  01-17-13   inserted 2004- "abdomen"  . LIGAMENT REPAIR Right 1987   Rt. knee scope  . MAXIMUM ACCESS (MAS)POSTERIOR LUMBAR INTERBODY FUSION (PLIF) 2 LEVEL N/A 06/07/2014   Procedure: L4-5 L5-S1 FOR MAXIMUM ACCESS (MAS) POSTERIOR LUMBAR INTERBODY FUSION ;  Surgeon: Erline Levine, MD;  Location: [facility];  Service: Neurosurgery;  Laterality: N/A;  L4-5 L5-S1 FOR MAXIMUM ACCESS (MAS) POSTERIOR LUMBAR INTERBODY FUSION   . TONSILLECTOMY     as child  Family History  Problem Relation Age of Onset  . Diabetes Mother   . Atrial fibrillation Mother   . Hypertension Mother   . Heart disease Father        Unsure of details  . Hypertension Father   . Prostate cancer Father   . Alzheimer's disease Father   . Kidney disease Brother   . Healthy Sister   . Diabetes Maternal Grandmother   . Heart disease Maternal Grandmother   . Stroke Maternal Grandfather 50  . Hypertension Maternal Grandfather   . Hypertension Paternal Grandmother   . Alzheimer's disease Paternal Grandmother   . Colon cancer Neg Hx   . Colon polyps Neg Hx   . Stomach cancer Neg Hx   . Esophageal cancer Neg Hx   . Pancreatic cancer Neg Hx   . Liver disease Neg Hx     Social History   Tobacco Use  . Smoking status: Former Smoker    Packs/day: 0.50    Years: 10.00    Pack years: 5.00    Types: Cigarettes    Quit date: 04/19/2009    Years since quitting: 10.2  . Smokeless tobacco: Never Used  Substance Use Topics  . Alcohol use: Yes    Alcohol/week: 0.0 standard drinks    Comment: occ. social- wine-1 drink monthly    Subjective:   I connected with [name] on 07/10/19 at 10:40 AM EDT by telephone call  and  verified that I am speaking with the correct person using two identifiers.   I discussed the limitations of evaluation and management by telemedicine and the availability of in person appointments. The patient expressed understanding and agreed to proceed. Provider in office/ patient is at home; provider and patient are only 2 people on video call.   Patient complaining of sinus pain/ pressure- feels that moving into her chest; is prone to recurrent sinus infections especially this time of year; using OTC Mucinex and Robitussin; does occasionally hear herself wheezing but no chest pain or shortness of breath; is fully vaccinated against COVID and has no concerns for exposure at this time.    Objective:  There were no vitals filed for this visit.  Lungs: Respirations unlabored; occasional wheeze noted when patient is coughing;  Neurologic: Alert and oriented; speech intact; hoarseness noted;   Assessment:  1. Acute sinusitis, recurrence not specified, unspecified location   2. Acute bronchitis, unspecified organism     Plan:  Rx for Doxycycline 100 mg bid x 10 days, albuterol inhaler; continue Mucinex and Robitussin; increase fluids, rest and follow-up worse, no better.  Time spent 12 minutes  No follow-ups on file.  No orders of the defined types were placed in this encounter.   Requested Prescriptions   Signed Prescriptions Disp Refills  . doxycycline (VIBRA-TABS) 100 MG tablet 20 tablet 0    Sig: Take 1 tablet (100 mg total) by mouth 2 (two) times daily.  Marland Kitchen albuterol (VENTOLIN HFA) 108 (90 Base) MCG/ACT inhaler 6.7 g 0    Sig: Inhale 2 puffs into the lungs every 6 (six) hours as needed for wheezing or shortness of breath.

## 2019-07-12 ENCOUNTER — Ambulatory Visit: Payer: Medicare HMO | Admitting: Family Medicine

## 2019-07-12 ENCOUNTER — Ambulatory Visit: Payer: Medicare HMO | Admitting: Cardiology

## 2019-07-23 ENCOUNTER — Other Ambulatory Visit: Payer: Self-pay

## 2019-07-23 ENCOUNTER — Ambulatory Visit: Payer: Medicare HMO | Admitting: Family Medicine

## 2019-07-23 ENCOUNTER — Encounter: Payer: Self-pay | Admitting: Family Medicine

## 2019-07-23 VITALS — BP 150/90 | HR 78 | Ht 67.0 in | Wt 262.0 lb

## 2019-07-23 DIAGNOSIS — M5416 Radiculopathy, lumbar region: Secondary | ICD-10-CM

## 2019-07-23 DIAGNOSIS — R635 Abnormal weight gain: Secondary | ICD-10-CM | POA: Diagnosis not present

## 2019-07-23 MED ORDER — METHYLPREDNISOLONE ACETATE 40 MG/ML IJ SUSP
40.0000 mg | Freq: Once | INTRAMUSCULAR | Status: AC
  Administered 2019-07-23: 40 mg via INTRAMUSCULAR

## 2019-07-23 MED ORDER — KETOROLAC TROMETHAMINE 30 MG/ML IJ SOLN
30.0000 mg | Freq: Once | INTRAMUSCULAR | Status: AC
  Administered 2019-07-23: 30 mg via INTRAMUSCULAR

## 2019-07-23 NOTE — Assessment & Plan Note (Signed)
Worsening symptoms again.  Known spinal stenosis.  Has failed conservative therapy and did have surgical intervention previously.  Discussed with patient about icing regimen, home exercise, which activities to do which wants to avoid.  I do believe that patient does need epidural at this time.  This will hopefully be diagnostic as well as therapeutic.  Worsening symptoms MRI would be necessary otherwise.  Continue the same medications we discussed medication management and unable to add anything secondary to potential interactions.  Follow-up with me again in 3 to 4 weeks after epidural

## 2019-07-23 NOTE — Patient Instructions (Signed)
Good to see you  Referral to PT for aquatic therapy  Refer to healthy weight  Epidural ordered and call [phone] to schedule 2 injection to help with pain today  See me again 4 weeks after injection in the back

## 2019-07-23 NOTE — Assessment & Plan Note (Signed)
Recommended to healthy weight and wellness.

## 2019-07-23 NOTE — Progress Notes (Signed)
[facility] [address] [place] Phone: [phone] Subjective:   I [name] am serving as a Education administrator for Dr. Hulan Saas.  This visit occurred during the SARS-CoV-2 public health emergency.  Safety protocols were in place, including screening questions prior to the visit, additional usage of staff PPE, and extensive cleaning of exam room while observing appropriate contact time as indicated for disinfecting solutions.   I'm seeing this patient by the request  of:  Hoyt Koch, MD  CC: Low back pain follow-up  WGN:[id number]   06/14/2019 Patient with known lumbar radiculopathy but also peripheral neuropathy.  Repeat nerve conduction study that I think will be beneficial.  We looked over patient's previous MRIs and has had surgical intervention of the back.  Do feel a repeat x-ray of the back would be beneficial as well with the idea that possible MRI may be necessary.  Discussed posture and ergonomics discussed that we can repeat injection again in the gluteal tendon if necessary but would like to wait at least 10 weeks.  Patient is doing relatively better with that at the moment.  Patient will increase activity slowly.  07/23/2019 [name] is a 64 y.o. female coming in with complaint of back pain. Patient states she feels awful. States she is very tight. Pain is persistent with walking.  Patient continues to have significant amount of pain.  Using a cane or a walker to help.  Patient was sent for nerve conduction test and was found the patient did have more peripheral neuropathy as well as some chronic S1 radiculopathy.  That was moderate patient states that the pain in her back and lower extremity seems to be worse at the moment.  Some of that is secondary to the neurologic symptoms.  Patient has not been very active and is continuing to have worsening symptoms including nerve pain.  Also causing weight gain that has been  very difficult.    Past Medical History:  Diagnosis Date  . Anemia    takes Ferrous Sulfate daily  . Anxiety    takes Citalopram daily  . Arthritis   . Asthma    2004-prior to gastric bypass and no problems since  . CHF (congestive heart failure) (HCC)    takes Lasix daily as needed  . Chronic back pain    spondylolisthesis/stenosis/radiculopathy  . Complication of anesthesia yrs ago   slow to wake up  . Depression   . Diabetes ([place])   . DVT (deep venous thrombosis) ([place]) 01/17/2013   past hx. -tx.5-6 yrs ago bilateral legs, occ. sporadic swelling, has IVC filter implanted  . Dysrhythmia    "heart tends to flutter"  . Fibromyalgia   . Fracture    right foot and is in a cam boot  . Gallstones   . GERD (gastroesophageal reflux disease)    hx of-no meds now  . Heart murmur   . History of bronchitis 2012 or 2013  . History of colon polyps   . Hypertension    takes Metoprolol daily  . Insomnia    takes Melatonin daily  . Pelvic floor dysfunction   . Peripheral neuropathy   . Pneumonia 90's   hx of  . S/P gastric bypass 2003  . Sleep apnea    no cpap used in many yrs after weight lost-no machine now  . Urinary frequency   . Urinary urgency   . Vitamin D deficiency    Past Surgical  History:  Procedure Laterality Date  . BALLOON DILATION N/A 01/31/2013   Procedure: BALLOON DILATION;  Surgeon: William Outlaw, MD;  Location: [facility];  Service: Endoscopy;  Laterality: N/A;  . CHOLECYSTECTOMY  2007  . COLONOSCOPY    . DILATION AND CURETTAGE OF UTERUS  yrs ago  . ESOPHAGOGASTRODUODENOSCOPY (EGD) WITH PROPOFOL  03/01/2012   Procedure: ESOPHAGOGASTRODUODENOSCOPY (EGD) WITH PROPOFOL;  Surgeon: William Outlaw, MD;  Location: [facility];  Service: Endoscopy;  Laterality: N/A;  . ESOPHAGOGASTRODUODENOSCOPY (EGD) WITH PROPOFOL N/A 01/31/2013   Procedure: ESOPHAGOGASTRODUODENOSCOPY (EGD) WITH PROPOFOL;  Surgeon: William Outlaw, MD;  Location: [facility];  Service:  Endoscopy;  Laterality: N/A;  . ESOPHAGOGASTRODUODENOSCOPY (EGD) WITH PROPOFOL N/A 09/02/2015   Procedure: ESOPHAGOGASTRODUODENOSCOPY (EGD) WITH PROPOFOL;  Surgeon: Carl E Gessner, MD;  Location: [facility];  Service: Endoscopy;  Laterality: N/A;  . GASTRIC BY-PASS  2004  . HERNIA REPAIR  2005  . INSERTION OF VENA CAVA FILTER  01-17-13   inserted 2004- "abdomen"  . LIGAMENT REPAIR Right 1987   Rt. knee scope  . MAXIMUM ACCESS (MAS)POSTERIOR LUMBAR INTERBODY FUSION (PLIF) 2 LEVEL N/A 06/07/2014   Procedure: L4-5 L5-S1 FOR MAXIMUM ACCESS (MAS) POSTERIOR LUMBAR INTERBODY FUSION ;  Surgeon: Joseph Stern, MD;  Location: [facility];  Service: Neurosurgery;  Laterality: N/A;  L4-5 L5-S1 FOR MAXIMUM ACCESS (MAS) POSTERIOR LUMBAR INTERBODY FUSION   . TONSILLECTOMY     as child   Social History   Socioeconomic History  . Marital status: Widowed    Spouse name: Not on file  . Number of children: 0  . Years of education: college  . Highest education level: Not on file  Occupational History  . Occupation: retired  Tobacco Use  . Smoking status: Former Smoker    Packs/day: 0.50    Years: 10.00    Pack years: 5.00    Types: Cigarettes    Quit date: 04/19/2009    Years since quitting: 10.2  . Smokeless tobacco: Never Used  Substance and Sexual Activity  . Alcohol use: Yes    Alcohol/week: 0.0 standard drinks    Comment: occ. social- wine-1 drink monthly  . Drug use: No  . Sexual activity: Not Currently  Other Topics Concern  . Not on file  Social History Narrative   Widow.  No kids.  Lives with her mother who is 99.  She does walk with a cane.  She is a retired education specialist   She quit smoking in 2003   Social Determinants of Health   Financial Resource Strain:   . Difficulty of Paying Living Expenses:   Food Insecurity:   . Worried About Running Out of Food in the Last Year:   . Ran Out of Food in the Last Year:   Transportation Needs:   . Lack of Transportation (Medical):    . Lack of Transportation (Non-Medical):   Physical Activity:   . Days of Exercise per Week:   . Minutes of Exercise per Session:   Stress:   . Feeling of Stress :   Social Connections:   . Frequency of Communication with Friends and Family:   . Frequency of Social Gatherings with Friends and Family:   . Attends Religious Services:   . Active Member of Clubs or Organizations:   . Attends Club or Organization Meetings:   . Marital Status:    Allergies  Allergen Reactions  . Carafate [Sucralfate] Hives    Other reaction(s): Angioedema (ALLERGY/intolerance) hives scratching  . Sulfonamide Derivatives   Rash    REACTION: Syncope   Family History  Problem Relation Age of Onset  . Diabetes Mother   . Atrial fibrillation Mother   . Hypertension Mother   . Heart disease Father        Unsure of details  . Hypertension Father   . Prostate cancer Father   . Alzheimer's disease Father   . Kidney disease Brother   . Healthy Sister   . Diabetes Maternal Grandmother   . Heart disease Maternal Grandmother   . Stroke Maternal Grandfather 50  . Hypertension Maternal Grandfather   . Hypertension Paternal Grandmother   . Alzheimer's disease Paternal Grandmother   . Colon cancer Neg Hx   . Colon polyps Neg Hx   . Stomach cancer Neg Hx   . Esophageal cancer Neg Hx   . Pancreatic cancer Neg Hx   . Liver disease Neg Hx      Current Outpatient Medications (Cardiovascular):  .  amLODipine (NORVASC) 10 MG tablet, TAKE 1 TABLET EVERY DAY .  furosemide (LASIX) 40 MG tablet, TAKE 1 TABLET DAILY AS NEEDED FOR EDEMA .  metoprolol tartrate (LOPRESSOR) 50 MG tablet, Take 2 tablets (100 mg total) by mouth once for 1 dose. TAKE ONE HOUR PRIOR TO  SCHEDULE CARDIAC TEST  Current Outpatient Medications (Respiratory):  .  albuterol (VENTOLIN HFA) 108 (90 Base) MCG/ACT inhaler, Inhale 2 puffs into the lungs every 6 (six) hours as needed for wheezing or shortness of breath.  Current Outpatient  Medications (Analgesics):  .  traMADol (ULTRAM) 50 MG tablet, TAKE 1 TABLET(50 MG) BY MOUTH FOUR TIMES DAILY  Current Outpatient Medications (Hematological):  .  rivaroxaban (XARELTO) 20 MG TABS tablet, Take 1 tablet (20 mg total) by mouth daily with supper.  Current Outpatient Medications (Other):  .  ACCU-CHEK SOFTCLIX LANCETS lancets, CHECK BLOOD SUGARS TWICE DAILY .  Alcohol Swabs (B-D SINGLE USE SWABS REGULAR) PADS, USE TO WIPE AREA TO CHECK BLOOD SUGAR DAILY  .  BLACK COHOSH PO, Take 1 tablet by mouth daily. .  Blood Glucose Calibration (ACCU-CHEK AVIVA) SOLN, Use as directed .  Blood Glucose Monitoring Suppl (ACCU-CHEK AVIVA PLUS) w/Device KIT, USE AS DIRECTED  TO CHECK BLOOD SUGAR EVERY DAY .  doxycycline (VIBRA-TABS) 100 MG tablet, Take 1 tablet (100 mg total) by mouth 2 (two) times daily. .  DULoxetine (CYMBALTA) 30 MG capsule, TAKE 1 TO 2 CAPSULES EVERY DAY .  glucose blood (ACCU-CHEK AVIVA PLUS) test strip, 1 each by Other route 2 (two) times daily. Use to check blood sugars twice a day .  Multiple Vitamins-Minerals (MULTIVITAMINS THER. W/MINERALS) TABS, Take 1 tablet by mouth daily.  .  ondansetron (ZOFRAN) 4 MG tablet, Take 1 tablet (4 mg total) by mouth every 8 (eight) hours as needed for nausea or vomiting. .  pantoprazole (PROTONIX) 40 MG tablet, Take 1 tablet (40 mg total) by mouth daily. .  potassium chloride SA (K-DUR) 20 MEQ tablet, TAKE 1 TABLET EVERY DAY .  pregabalin (LYRICA) 300 MG capsule, Take 1 capsule (300 mg total) by mouth 2 (two) times daily. .  traZODone (DESYREL) 50 MG tablet, Take 1 tablet (50 mg total) by mouth at bedtime as needed for sleep.   Reviewed prior external information including notes and imaging from  primary care provider As well as notes that were available from care everywhere and other healthcare systems.  Past medical history, social, surgical and family history all reviewed in electronic medical record.  No pertanent information unless    stated regarding to the chief complaint.   Review of Systems:  No headache, visual changes, nausea, vomiting, diarrhea, constipation, dizziness, abdominal pain, skin rash, fevers, chills, night sweats, weight loss, swollen lymph nodes, body aches, joint swelling, chest pain, shortness of breath, mood changes. POSITIVE muscle aches  Objective  Blood pressure (!) 150/90, pulse 78, height 5' 7" (1.702 m), weight 262 lb (118.8 kg), SpO2 97 %.   General: No apparent distress alert and oriented x3 mood and affect normal, dressed appropriately.  Morbidly obese HEENT: Pupils equal, extraocular movements intact  Gait severely antalgic Patient is tender to palpation in the paraspinal musculature lumbar spine right than the left.  Tender to palpation down the legs even to light sensation as well.  Neurovascularly he does have some decreased sensation in the legs bilaterally.  4 out of 5 strength of the legs bilaterally.   Impression and Recommendations:     This case required medical decision making of moderate complexity. The above documentation has been reviewed and is accurate and complete Zachary M Smith, DO       Note: This dictation was prepared with Dragon dictation along with smaller phrase technology. Any transcriptional errors that result from this process are unintentional.         

## 2019-07-24 ENCOUNTER — Other Ambulatory Visit: Payer: Self-pay | Admitting: Internal Medicine

## 2019-07-26 ENCOUNTER — Other Ambulatory Visit: Payer: Self-pay

## 2019-07-26 ENCOUNTER — Ambulatory Visit
Admission: RE | Admit: 2019-07-26 | Discharge: 2019-07-26 | Disposition: A | Discharge status: Home / Self Care | Payer: Medicare HMO | Source: Ambulatory Visit | Attending: Family Medicine | Admitting: Family Medicine

## 2019-07-26 DIAGNOSIS — M48061 Spinal stenosis, lumbar region without neurogenic claudication: Secondary | ICD-10-CM | POA: Diagnosis not present

## 2019-07-26 DIAGNOSIS — M5416 Radiculopathy, lumbar region: Secondary | ICD-10-CM

## 2019-07-26 MED ORDER — METHYLPREDNISOLONE ACETATE 40 MG/ML INJ SUSP (RADIOLOG
120.0000 mg | Freq: Once | INTRAMUSCULAR | Status: AC
  Administered 2019-07-26: 120 mg via EPIDURAL

## 2019-07-26 MED ORDER — IOPAMIDOL (ISOVUE-M 200) INJECTION 41%
1.0000 mL | Freq: Once | INTRAMUSCULAR | Status: AC
  Administered 2019-07-26: 1 mL via EPIDURAL

## 2019-07-26 NOTE — Discharge Instructions (Signed)

## 2019-08-03 ENCOUNTER — Other Ambulatory Visit: Payer: Self-pay | Admitting: Family

## 2019-08-07 ENCOUNTER — Encounter: Payer: Self-pay | Admitting: Internal Medicine

## 2019-08-08 ENCOUNTER — Other Ambulatory Visit: Payer: Self-pay

## 2019-08-08 ENCOUNTER — Ambulatory Visit (INDEPENDENT_AMBULATORY_CARE_PROVIDER_SITE_OTHER): Payer: Medicare HMO | Admitting: Cardiology

## 2019-08-08 ENCOUNTER — Encounter: Payer: Self-pay | Admitting: Cardiology

## 2019-08-08 VITALS — BP 131/78 | HR 89 | Temp 97.8°F | Resp 20 | Ht 67.0 in | Wt 264.2 lb

## 2019-08-08 DIAGNOSIS — I10 Essential (primary) hypertension: Secondary | ICD-10-CM | POA: Diagnosis not present

## 2019-08-08 DIAGNOSIS — R262 Difficulty in walking, not elsewhere classified: Secondary | ICD-10-CM | POA: Diagnosis not present

## 2019-08-08 DIAGNOSIS — R0602 Shortness of breath: Secondary | ICD-10-CM

## 2019-08-08 DIAGNOSIS — R000 Tachycardia, unspecified: Secondary | ICD-10-CM

## 2019-08-08 DIAGNOSIS — R0600 Dyspnea, unspecified: Secondary | ICD-10-CM | POA: Diagnosis not present

## 2019-08-08 DIAGNOSIS — R0609 Other forms of dyspnea: Secondary | ICD-10-CM

## 2019-08-08 DIAGNOSIS — R079 Chest pain, unspecified: Secondary | ICD-10-CM | POA: Diagnosis not present

## 2019-08-08 DIAGNOSIS — M7989 Other specified soft tissue disorders: Secondary | ICD-10-CM | POA: Diagnosis not present

## 2019-08-08 DIAGNOSIS — R011 Cardiac murmur, unspecified: Secondary | ICD-10-CM | POA: Diagnosis not present

## 2019-08-08 DIAGNOSIS — R2681 Unsteadiness on feet: Secondary | ICD-10-CM | POA: Diagnosis not present

## 2019-08-08 DIAGNOSIS — G4733 Obstructive sleep apnea (adult) (pediatric): Secondary | ICD-10-CM | POA: Diagnosis not present

## 2019-08-08 DIAGNOSIS — M545 Low back pain: Secondary | ICD-10-CM | POA: Diagnosis not present

## 2019-08-08 DIAGNOSIS — M6281 Muscle weakness (generalized): Secondary | ICD-10-CM | POA: Diagnosis not present

## 2019-08-08 NOTE — Progress Notes (Signed)
Primary Care Provider: Hoyt Koch, MD Cardiologist: No primary care provider on file. Electrophysiologist: None  Clinic Note: Chief Complaint  Patient presents with  . Follow-up    Has not had all of her test, mother passed away in 28-Apr-2022, had to reschedule testing  . Shortness of Breath    With chest pain    HPI:    [name] is a 64 y.o. morbidly obese female (s/p GOP in 2003) with a history of bilateral DVTs (status post IVC filter 2009, no longer on anticoagulation) with chronic edema being seen today for delayed follow-up evaluation of CONCERN FOR HEART FAILURE and SHORTNESS OF BREATH/CHEST TIGHTNESS ON EXERTION.   [name] was last seen on November 24 seen at the request of Hoyt Koch, *.  She had multiple complaints including extreme fatigue and being deconditioned.  She indicated that she had come off of CPAP and has been doing well, but now noticing more fatigue.  Has history of chronic left-sided DVTs with IVC filter in 2009 and has been relatively unstable and unsteady gait since then walking with a cane. --> She indicated that she was not able to walk around the block without getting short of breath and having some chest tightness--also noted chest tightness at night..  Also noted occasionally waking up short of breath. --> She also noted that she had gained over 20 pounds despite not eating significantly. -> She been placed on diuretic/Lasix and was taking it maybe once or twice a month.  Wearing compression stockings.  Epworth score was 20 (reviewed below) -> plan was evaluation of dyspnea with echo and coronary CT angiogram followed by polysomnogram..  Palpitations evaluated with Zio patch.  Recent Hospitalizations: None  Reviewed  CV studies:    The following studies were reviewed today: (if available, images/films reviewed: From Epic Chart or Care Everywhere) . 03/2019: Echo: EF 60 to 65%.  Mild LVH.  No R WMA.  Normal RV  size.  Normal LA, mild RA dilation.  No aortic valve stenosis or sclerosis. . Event Monitor: Predominantly sinus rhythm.  Rates range from 48-124 bpm.  Average 74 bpm.  Frequent short bursts (3-15 beats) PAT/PSVT--not indicated as being symptomatic on diary.  Otherwise rare PACs and PVCs.   Interval History:   [name] returns here today finally in follow-up.  Unfortunately she did not get the coronary CT angiogram performed because of the timing overlapping with her needing to be at home with her mother during her last days of life.  She then just had not felt up to rescheduling.  Is taking her little while to come to grips with her mother's passing and has been quite sad.  She is now gradually starting to get back into her routine and continues to note intermittent episodes of chest pain most noted last night she had a prolonged episode while watching television of left-sided chest pain that seems to come and go.  She still gets short of breath with exertion and sometimes has this discomfort with exertion.  She has been a couple different types of chest pain which makes it difficult to fully understand. She really does not have PND and orthopnea but does have swelling off and on.  She occasionally will have spells at night where she stops breathing, but no real PND.  It does not seem like she is all that symptomatic of any palpitations.  They occur but not routinely. The swelling seems to be mostly notable  on the right leg but bilateral.  She actually has injured her right ankle which may have something to do with it.  CV Review of Symptoms (Summary) Cardiovascular ROS: positive for - chest pain, dyspnea on exertion, edema, shortness of breath and Generalized fatigue/exercise intolerance negative for - irregular heartbeat, orthopnea, paroxysmal nocturnal dyspnea, rapid heart rate, shortness of breath or Syncope/near syncope, TIA/amaurosis fugax, claudication  The patient does not have  symptoms concerning for COVID-19 infection (fever, chills, cough, or new shortness of breath).  The patient is practicing social distancing & Masking.  -- Covid vaccine ++   REVIEWED OF SYSTEMS   Review of Systems  Constitutional: Positive for malaise/fatigue (Partly not feeling well rested, exertional intolerance.). Negative for weight loss (Weight still going up and down.).  HENT: Negative for congestion and nosebleeds.   Respiratory: Positive for shortness of breath (Persistent/chronic).   Cardiovascular: Positive for leg swelling (Wear support hose.  Probably does not take Lasix but much.).  Gastrointestinal: Negative for abdominal pain, blood in stool and melena.  Genitourinary: Negative for hematuria.  Musculoskeletal: Positive for back pain and joint pain (Right ankle).  Neurological: Positive for focal weakness (Still has some left-sided weakness.). Negative for dizziness, weakness and headaches.  Psychiatric/Behavioral: Negative for memory loss. The patient is not nervous/anxious and does not have insomnia (Not necessarily insomnia, just hard hard time sleeping./Feeling rested).    Tired/fatigue back pain, joint pain, dizziness, left arm and leg weakness.  Anxious.  Not sleeping well.   03/13/2019 2:00 PM  How likely are you to doze off or fall asleep in the following situations?    Sitting and reading? High chance of dozing - 3  Watching TV? High chance of dozing - 3  Sitting, inactive in a public place (e.g. a theatre or a meeting)? Moderate chance of dozing -2  As a passenger in a car for an hour without a break? High chance of dozing -3  Lying down to rest in the afternoon when circumstances permit? High chance of dozing - 3  Sitting and talking to someone? Slight chance of dozing - 1  Sitting quietly after a lunch without alcohol? High chance of dozing - 3  In a car, while stopped for a few minutes in traffic?  Moderate chance of dozing - 2  Epworth Total Score 20    Document the patient's comorbid illnesses and symptoms of sleep apnea    The patient has the following comorbid illnesses: -- Not diagnosed  The patient has the following symptoms of sleep apnea: Snoring; Daytime Sleepiness; Un-refreshing Sleep; Morning Headaches; Nocturia; Decreased Sexual Desire     I have reviewed and (if needed) personally updated the patient's problem list, medications, allergies, past medical and surgical history, social and family history.   PAST MEDICAL HISTORY   Past Medical History:  Diagnosis Date  . Anemia    takes Ferrous Sulfate daily  . Anxiety    takes Citalopram daily  . Arthritis   . Asthma    2004-prior to gastric bypass and no problems since  . CHF (congestive heart failure) (HCC)    takes Lasix daily as needed  . Chronic back pain    spondylolisthesis/stenosis/radiculopathy  . Complication of anesthesia yrs ago   slow to wake up  . Depression   . Diabetes ([place])   . DVT (deep venous thrombosis) ([place]) 01/17/2013   past hx. -tx.5-6 yrs ago bilateral legs, occ. sporadic swelling, has IVC filter implanted  . Dysrhythmia    "  heart tends to flutter"  . Fibromyalgia   . Fracture    right foot and is in a cam boot  . Gallstones   . GERD (gastroesophageal reflux disease)    hx of-no meds now  . Heart murmur   . History of bronchitis 2012 or 2013  . History of colon polyps   . Hypertension    takes Metoprolol daily  . Insomnia    takes Melatonin daily  . Pelvic floor dysfunction   . Peripheral neuropathy   . Pneumonia 90's   hx of  . S/P gastric bypass 2003  . Sleep apnea    no cpap used in many yrs after weight lost-no machine now  . Urinary frequency   . Urinary urgency   . Vitamin D deficiency     PAST SURGICAL HISTORY   Past Surgical History:  Procedure Laterality Date  . BALLOON DILATION N/A 01/31/2013   Procedure: BALLOON DILATION;  Surgeon: Arta Silence, MD;  Location: [facility];  Service: Endoscopy;   Laterality: N/A;  . CARDIAC EVENT MONITOR  03/2019   Predominantly sinus rhythm.  Rates range from 48-124 bpm.  Average 74 bpm.  Frequent short bursts (3-15 beats) PAT/PSVT--not indicated as being symptomatic on diary.  Otherwise rare PACs and PVCs.  . CHOLECYSTECTOMY  2007  . COLONOSCOPY    . DILATION AND CURETTAGE OF UTERUS  yrs ago  . ESOPHAGOGASTRODUODENOSCOPY (EGD) WITH PROPOFOL  03/01/2012   Procedure: ESOPHAGOGASTRODUODENOSCOPY (EGD) WITH PROPOFOL;  Surgeon: Arta Silence, MD;  Location: [facility];  Service: Endoscopy;  Laterality: N/A;  . ESOPHAGOGASTRODUODENOSCOPY (EGD) WITH PROPOFOL N/A 01/31/2013   Procedure: ESOPHAGOGASTRODUODENOSCOPY (EGD) WITH PROPOFOL;  Surgeon: Arta Silence, MD;  Location: [facility];  Service: Endoscopy;  Laterality: N/A;  . ESOPHAGOGASTRODUODENOSCOPY (EGD) WITH PROPOFOL N/A 09/02/2015   Procedure: ESOPHAGOGASTRODUODENOSCOPY (EGD) WITH PROPOFOL;  Surgeon: Gatha Mayer, MD;  Location: [facility];  Service: Endoscopy;  Laterality: N/A;  . GASTRIC BY-PASS  2004  . HERNIA REPAIR  2005  . INSERTION OF VENA CAVA FILTER  01-17-13   inserted 2004- "abdomen"  . LIGAMENT REPAIR Right 1987   Rt. knee scope  . MAXIMUM ACCESS (MAS)POSTERIOR LUMBAR INTERBODY FUSION (PLIF) 2 LEVEL N/A 06/07/2014   Procedure: L4-5 L5-S1 FOR MAXIMUM ACCESS (MAS) POSTERIOR LUMBAR INTERBODY FUSION ;  Surgeon: Erline Levine, MD;  Location: [facility];  Service: Neurosurgery;  Laterality: N/A;  L4-5 L5-S1 FOR MAXIMUM ACCESS (MAS) POSTERIOR LUMBAR INTERBODY FUSION   . TONSILLECTOMY     as child  . TRANSTHORACIC ECHOCARDIOGRAM  03/2019   EF 60 to 65%.  Mild LVH.  No R WMA.  Normal RV size.  Normal LA, mild RA dilation.  No aortic valve stenosis or sclerosis.    MEDICATIONS/ALLERGIES   Current Meds  Medication Sig  . ACCU-CHEK SOFTCLIX LANCETS lancets CHECK BLOOD SUGARS TWICE DAILY  . albuterol (VENTOLIN HFA) 108 (90 Base) MCG/ACT inhaler INHALE 2 PUFFS INTO THE LUNGS EVERY 6 HOURS  AS NEEDED FOR WHEEZING OR SHORTNESS OF BREATH  . Alcohol Swabs (B-D SINGLE USE SWABS REGULAR) PADS USE TO WIPE AREA TO CHECK BLOOD SUGAR DAILY   . amLODipine (NORVASC) 10 MG tablet TAKE 1 TABLET EVERY DAY  . BLACK COHOSH PO Take 1 tablet by mouth daily.  . Blood Glucose Calibration (ACCU-CHEK AVIVA) SOLN Use as directed  . Blood Glucose Monitoring Suppl (ACCU-CHEK AVIVA PLUS) w/Device KIT USE AS DIRECTED  TO CHECK BLOOD SUGAR EVERY DAY  . DULoxetine (CYMBALTA) 30 MG capsule TAKE  1 TO 2 CAPSULES EVERY DAY  . furosemide (LASIX) 40 MG tablet TAKE 1 TABLET DAILY AS NEEDED FOR EDEMA  . glucose blood (ACCU-CHEK AVIVA PLUS) test strip 1 each by Other route 2 (two) times daily. Use to check blood sugars twice a day  . metoprolol tartrate (LOPRESSOR) 50 MG tablet Take 2 tablets (100 mg total) by mouth once for 1 dose. TAKE ONE HOUR PRIOR TO  SCHEDULE CARDIAC TEST  . Multiple Vitamins-Minerals (MULTIVITAMINS THER. W/MINERALS) TABS Take 1 tablet by mouth daily.   . ondansetron (ZOFRAN) 4 MG tablet Take 1 tablet (4 mg total) by mouth every 8 (eight) hours as needed for nausea or vomiting.  . pantoprazole (PROTONIX) 40 MG tablet Take 1 tablet (40 mg total) by mouth daily.  . potassium chloride SA (K-DUR) 20 MEQ tablet TAKE 1 TABLET EVERY DAY  . traMADol (ULTRAM) 50 MG tablet TAKE 1 TABLET(50 MG) BY MOUTH FOUR TIMES DAILY  . traZODone (DESYREL) 50 MG tablet Take 1 tablet (50 mg total) by mouth at bedtime as needed for sleep.  . [DISCONTINUED] doxycycline (VIBRA-TABS) 100 MG tablet Take 1 tablet (100 mg total) by mouth 2 (two) times daily.  . [DISCONTINUED] pregabalin (LYRICA) 300 MG capsule Take 1 capsule (300 mg total) by mouth 2 (two) times daily.  . [DISCONTINUED] rivaroxaban (XARELTO) 20 MG TABS tablet Take 1 tablet (20 mg total) by mouth daily with supper.    Allergies  Allergen Reactions  . Carafate [Sucralfate] Hives, Itching and Other (See Comments)    Angioedema     . Sulfonamide Derivatives  Rash    Syncope    SOCIAL HISTORY/FAMILY HISTORY   Reviewed in Epic:  Pertinent findings: She was delayed to follow-up because her mother passed away in 2022/04/28 essentially she did not get her studies performed because her mother was taking a turn for the worse.  She then has not really feel like doing much of anything until now.  OBJCTIVE -PE, EKG, labs   Wt Readings from Last 3 Encounters:  08/09/19 268 lb (121.6 kg)  08/08/19 264 lb 3.2 oz (119.8 kg)  07/23/19 262 lb (118.8 kg)    Physical Exam: BP 131/78   Pulse 89   Temp 97.8 F (36.6 C)   Resp 20   Ht '5\' 7"'$  (1.702 m)   Wt 264 lb 3.2 oz (119.8 kg)   SpO2 97%   BMI 41.38 kg/m  Physical Exam  Constitutional: She is oriented to person, place, and time. She appears well-developed and well-nourished.  Well-groomed. Morbidly obese.  Walks with cane, slow unsteady gait.  HENT:  Head: Normocephalic and atraumatic.  Neck: No JVD present.  Cardiovascular: Normal rate, regular rhythm, S1 normal and S2 normal.  Occasional extrasystoles are present. PMI is not displaced (Unable to palpate). Exam reveals distant heart sounds and decreased pulses (Difficulty palpate due to swelling and support stockings in place.). Exam reveals no gallop and no friction rub.  Murmur (1/6 SEM at RUSB-) heard. Pulmonary/Chest: Effort normal. No respiratory distress. She exhibits tenderness (Not as prominent as last visit, still some left-sided chest wall tenderness.).  Distant breath sounds but no obvious wheezes rales or rhonchi.  Musculoskeletal:        General: Edema (Trivial 1+; support stockings on) present. Normal range of motion.     Cervical back: Normal range of motion and neck supple.     Comments: Somewhat slow unsteady gait.  Walks with a cane.  Neurological: She is alert and oriented to  person, place, and time.  Psychiatric: She has a normal mood and affect. Her behavior is normal. Judgment and thought content normal.  Vitals  reviewed.   Adult ECG Report Not checked  Recent Labs:   Lab Results  Component Value Date   CHOL 146 01/02/2019   HDL 73.40 01/02/2019   LDLCALC 60 01/02/2019   TRIG 66.0 01/02/2019   CHOLHDL 2 01/02/2019   Lab Results  Component Value Date   CREATININE 0.91 08/09/2019   BUN 19 08/09/2019   NA 145 08/09/2019   K 5.0 08/09/2019   CL 114 (H) 08/09/2019   CO2 26 08/09/2019   Lab Results  Component Value Date   TSH 0.98 01/02/2019    ASSESSMENT/PLAN    Problem List Items Addressed This Visit    Morbid obesity ([place]) (Chronic)    She probably could benefit from nutrition counseling.  She has had gastric bypass and still having more issues.  Unfortunately her joint related pains limit her activity level.  I would like to see if she can get into doing water aerobics.      OBSTRUCTIVE SLEEP APNEA (Chronic)    Prior history of OSA before her bypass surgery, but she remains morbidly obese.  Epworth score is 20.  I suspect that this is mostly complaints of fatigue.  Plan: Polysomnogram.      Relevant Orders   Split night study   Basic metabolic panel   Essential hypertension (Chronic)    On amlodipine as a reasonable first option.  Blood pressure looks pretty well controlled.  Continue to monitor.  I suspect that we can probably use of underlying diuretic since she is not using furosemide every day.      Dyspnea on exertion - Primary (Chronic)    Relatively reassuring results on echocardiogram.  I suspect her dyspnea is probably obesity/deconditioning related, but the fact that she is also having some chest discomfort I think we need to do an ischemic evaluation.    Plan-reschedule her coronary CT angiogram.  As previously indicated, with her body habitus this is a much more accurate test to reduce likelihood of false positive testing.  It will also give [place] baseline anatomic evaluation of her coronary disease from which we can determine further treatments.       Relevant Orders   Basic metabolic panel   CT CORONARY MORPH W/CTA COR W/SCORE W/CA W/CM &/OR WO/CM   CT CORONARY FRACTIONAL FLOW RESERVE DATA PREP   CT CORONARY FRACTIONAL FLOW RESERVE FLUID ANALYSIS   Systolic ejection murmur (Chronic)    No significant finding on echo to explain murmur.  Wonder if this could potentially related to elevated pulmonary pressures noted by RA dilation.      Relevant Orders   Basic metabolic panel   CT CORONARY MORPH W/CTA COR W/SCORE W/CA W/CM &/OR WO/CM   CT CORONARY FRACTIONAL FLOW RESERVE DATA PREP   CT CORONARY FRACTIONAL FLOW RESERVE FLUID ANALYSIS   Chest pain with moderate risk for cardiac etiology    She clearly had some reproducible chest discomfort when I first saw her.  She still having different types of chest pain that is very difficult to determine.  Since she is having dyspnea with chest discomfort on exertion however need to exclude ischemia with her having obesity hypertension and borderline diabetes.  Close metabolic syndrome.  Plan: Ischemic evaluation with coronary CTA and possible FFR.      Relevant Orders   Basic metabolic panel   CT CORONARY  MORPH W/CTA COR W/SCORE W/CA W/CM &/OR WO/CM   CT CORONARY FRACTIONAL FLOW RESERVE DATA PREP   CT CORONARY FRACTIONAL FLOW RESERVE FLUID ANALYSIS   Leg swelling    She is wearing the support stockings, and I wonder if she could probably benefit from increasing her Lasix to least every other day.      Rapid heartbeat    She still is having sensation of palpitations but not as significant.  Her monitor did show short bursts of PSVT or PAT.  Nothing lasting very long.  We discussed vagal maneuvers. She actually is not on beta-blocker which would be a potential option if additional blood pressure control as needed.      Relevant Orders   Basic metabolic panel   CT CORONARY MORPH W/CTA COR W/SCORE W/CA W/CM &/OR WO/CM   CT CORONARY FRACTIONAL FLOW RESERVE DATA PREP   CT CORONARY FRACTIONAL  FLOW RESERVE FLUID ANALYSIS    Other Visit Diagnoses    Shortness of breath       Relevant Orders   CT CORONARY MORPH W/CTA COR W/SCORE W/CA W/CM &/OR WO/CM   CT CORONARY FRACTIONAL FLOW RESERVE DATA PREP   CT CORONARY FRACTIONAL FLOW RESERVE FLUID ANALYSIS       COVID-19 Education: The signs and symptoms of COVID-19 were discussed with the patient and how to seek care for testing (follow up with PCP or arrange E-visit).   The importance of social distancing and COVID-19 vaccination was discussed today.  I spent a total of 26 minutes with the patient. >  50% of the time was spent in direct patient consultation.  Additional time spent with chart review  / charting (studies, outside notes, etc): 10 Total Time: 36 min   Current medicines are reviewed at length with the patient today.  (+/- concerns) N/A  Notice: This dictation was prepared with Dragon dictation along with smaller phrase technology. Any transcriptional errors that result from this process are unintentional and may not be corrected upon review.  Patient Instructions / Medication Changes & Studies & Tests Ordered   Patient Instructions  Medication Instructions:  The current medical regimen is effective;  continue present plan and medications as directed. Please refer to the Current Medication list given to you today. *If you need a refill on your cardiac medications before your next appointment, please call your pharmacy*  Testing/Procedures: SCHEDULE CT AS ORDERED-BMET [place]  Your physician has recommended that you have a sleep study. This test records several body functions during sleep, including: brain activity, eye movement, oxygen and carbon dioxide blood levels, heart rate and rhythm, breathing rate and rhythm, the flow of air through your mouth and nose, snoring, body muscle movements, and chest and belly movement.  Follow-Up: Your next appointment:  AFTER CT In Person with Glenetta Hew, MD  At [facility], you and your health needs are our priority.  As part of our continuing mission to provide you with exceptional heart care, we have created designated Provider Care Teams.  These Care Teams include your primary Cardiologist (physician) and Advanced Practice Providers (APPs -  Physician Assistants and Nurse Practitioners) who all work together to provide you with the care you need, when you need it.      Studies Ordered:   Orders Placed This Encounter  Procedures  . CT CORONARY MORPH W/CTA COR W/SCORE W/CA W/CM &/OR WO/CM  . CT CORONARY FRACTIONAL FLOW RESERVE DATA PREP  . CT CORONARY FRACTIONAL FLOW RESERVE FLUID  ANALYSIS  . Basic metabolic panel  . Split night study     Glenetta Hew, M.D., M.S. Interventional Cardiologist   Pager # [phone] Phone # [phone] [address] [place], [place] [address]   Thank you for choosing Heartcare at [facility]!!

## 2019-08-08 NOTE — Patient Instructions (Addendum)
Medication Instructions:  The current medical regimen is effective;  continue present plan and medications as directed. Please refer to the Current Medication list given to you today. *If you need a refill on your cardiac medications before your next appointment, please call your pharmacy*  Testing/Procedures: SCHEDULE CT AS ORDERED-BMET [place]  Your physician has recommended that you have a sleep study. This test records several body functions during sleep, including: brain activity, eye movement, oxygen and carbon dioxide blood levels, heart rate and rhythm, breathing rate and rhythm, the flow of air through your mouth and nose, snoring, body muscle movements, and chest and belly movement.  Follow-Up: Your next appointment:  AFTER CT In Person with Glenetta Hew, MD  At [facility], you and your health needs are our priority.  As part of our continuing mission to provide you with exceptional heart care, we have created designated Provider Care Teams.  These Care Teams include your primary Cardiologist (physician) and Advanced Practice Providers (APPs -  Physician Assistants and Nurse Practitioners) who all work together to provide you with the care you need, when you need it.

## 2019-08-09 ENCOUNTER — Ambulatory Visit (INDEPENDENT_AMBULATORY_CARE_PROVIDER_SITE_OTHER): Payer: Medicare HMO | Admitting: Internal Medicine

## 2019-08-09 ENCOUNTER — Telehealth: Payer: Self-pay | Admitting: *Deleted

## 2019-08-09 ENCOUNTER — Other Ambulatory Visit: Payer: Self-pay | Admitting: Internal Medicine

## 2019-08-09 ENCOUNTER — Encounter: Payer: Self-pay | Admitting: Internal Medicine

## 2019-08-09 VITALS — BP 118/80 | HR 80 | Temp 98.1°F | Ht 67.0 in | Wt 268.0 lb

## 2019-08-09 DIAGNOSIS — R0600 Dyspnea, unspecified: Secondary | ICD-10-CM

## 2019-08-09 DIAGNOSIS — R8290 Unspecified abnormal findings in urine: Secondary | ICD-10-CM | POA: Diagnosis not present

## 2019-08-09 DIAGNOSIS — R0609 Other forms of dyspnea: Secondary | ICD-10-CM

## 2019-08-09 DIAGNOSIS — I10 Essential (primary) hypertension: Secondary | ICD-10-CM | POA: Diagnosis not present

## 2019-08-09 DIAGNOSIS — Z6841 Body Mass Index (BMI) 40.0 and over, adult: Secondary | ICD-10-CM | POA: Diagnosis not present

## 2019-08-09 LAB — COMPREHENSIVE METABOLIC PANEL
ALT: 44 U/L — ABNORMAL HIGH (ref 0–35)
AST: 26 U/L (ref 0–37)
Albumin: 3.9 g/dL (ref 3.5–5.2)
Alkaline Phosphatase: 203 U/L — ABNORMAL HIGH (ref 39–117)
BUN: 19 mg/dL (ref 6–23)
CO2: 26 mEq/L (ref 19–32)
Calcium: 8.8 mg/dL (ref 8.4–10.5)
Chloride: 114 mEq/L — ABNORMAL HIGH (ref 96–112)
Creatinine, Ser: 0.91 mg/dL (ref 0.40–1.20)
GFR: 74.88 mL/min (ref >60.00)
Glucose, Bld: 96 mg/dL (ref 70–99)
Potassium: 5 mEq/L (ref 3.5–5.1)
Sodium: 145 mEq/L (ref 135–145)
Total Bilirubin: 0.3 mg/dL (ref 0.2–1.2)
Total Protein: 6.7 g/dL (ref 6.0–8.3)

## 2019-08-09 LAB — CBC
HCT: 37 % (ref 36.0–46.0)
Hemoglobin: 12.1 g/dL (ref 12.0–15.0)
MCHC: 32.6 g/dL (ref 30.0–36.0)
MCV: 86.9 fl (ref 78.0–100.0)
Platelets: 203 10*3/uL (ref 150.0–400.0)
RBC: 4.26 Mil/uL (ref 3.87–5.11)
RDW: 17.6 % — ABNORMAL HIGH (ref 11.5–15.5)
WBC: 7.9 10*3/uL (ref 4.0–10.5)

## 2019-08-09 MED ORDER — NITROFURANTOIN MONOHYD MACRO 100 MG PO CAPS: 100.0000 mg | ORAL_CAPSULE | Freq: Two times a day (BID) | ORAL | 0 refills | Status: DC

## 2019-08-09 NOTE — Patient Instructions (Signed)
We have sent in the macrobid for the likely urinary tract infection. Take 1 pill twice a day for 1 week.

## 2019-08-09 NOTE — Progress Notes (Signed)
   Subjective:   Patient ID: [name], female    DOB: [birth year], 64 y.o.   MRN: [id number]  HPI The patient is a 64 YO female coming in for several concerns including possible UTI (some burning with urination and pain in low abdomen, started in the last week or two, overall it is worsening, hurting now all the time in the low stomach, more often urinating and having a hard time getting to the bathroom, with foul odor, uses diapers due to incontinence, has tried increasing water which has not helped)  Review of Systems  Constitutional: Negative.   Respiratory: Positive for shortness of breath.   Cardiovascular: Negative.   Gastrointestinal: Positive for abdominal pain. Negative for abdominal distention, constipation, diarrhea, nausea and vomiting.  Genitourinary: Positive for dysuria, frequency and urgency.  Musculoskeletal: Negative.   Skin: Negative.     Objective:  Physical Exam Constitutional:      Appearance: She is well-developed. She is obese.  HENT:     Head: Normocephalic and atraumatic.  Cardiovascular:     Rate and Rhythm: Normal rate and regular rhythm.  Pulmonary:     Effort: Pulmonary effort is normal. No respiratory distress.     Breath sounds: Normal breath sounds. No wheezing or rales.  Abdominal:     General: Bowel sounds are normal. There is no distension.     Palpations: Abdomen is soft.     Tenderness: There is no abdominal tenderness. There is no rebound.  Musculoskeletal:     Cervical back: Normal range of motion.  Skin:    General: Skin is warm and dry.  Neurological:     Mental Status: She is alert and oriented to person, place, and time.     Coordination: Coordination abnormal.     Comments: Cane for ambulation with slow gait and SOB on exertion     Vitals:   08/09/19 1503  BP: 118/80  Pulse: 80  Temp: 98.1 F (36.7 C)  SpO2: 99%  Weight: 268 lb (121.6 kg)  Height: 5\' 7"  (1.702 m)    This visit occurred during the SARS-CoV-2  public health emergency.  Safety protocols were in place, including screening questions prior to the visit, additional usage of staff PPE, and extensive cleaning of exam room while observing appropriate contact time as indicated for disinfecting solutions.   Assessment & Plan:

## 2019-08-09 NOTE — Telephone Encounter (Signed)
-----   Message from Marilyn K Fogleman sent at 08/08/2019 12:02 PM EDT ----- °Regarding: Sleep Study °Dr. Harding ° °

## 2019-08-10 DIAGNOSIS — R8290 Unspecified abnormal findings in urine: Secondary | ICD-10-CM | POA: Insufficient documentation

## 2019-08-10 NOTE — Assessment & Plan Note (Signed)
Given urinary complaints and unable to produce sample needs CBC and CMP to check for complications.

## 2019-08-10 NOTE — Telephone Encounter (Signed)
Check [place] registry last filled 07/10/2019.Marland KitchenJohny [name]

## 2019-08-10 NOTE — Assessment & Plan Note (Signed)
Overall worsening and currently undergoing more workup with cardiology including CT cardiac and split night sleep study.

## 2019-08-10 NOTE — Assessment & Plan Note (Signed)
She is unable to produce urine specimen at visit. Will treat with macrobid given allergies and no suspicion for pyelonephritis.

## 2019-08-11 ENCOUNTER — Encounter: Payer: Self-pay | Admitting: Cardiology

## 2019-08-11 NOTE — Assessment & Plan Note (Addendum)
Relatively reassuring results on echocardiogram.  I suspect her dyspnea is probably obesity/deconditioning related, but the fact that she is also having some chest discomfort I think we need to do an ischemic evaluation.    Plan-reschedule her coronary CT angiogram.  As previously indicated, with her body habitus this is a much more accurate test to reduce likelihood of false positive testing.  It will also give [place] baseline anatomic evaluation of her coronary disease from which we can determine further treatments.

## 2019-08-11 NOTE — Assessment & Plan Note (Signed)
She is wearing the support stockings, and I wonder if she could probably benefit from increasing her Lasix to least every other day.

## 2019-08-11 NOTE — Assessment & Plan Note (Signed)
She still is having sensation of palpitations but not as significant.  Her monitor did show short bursts of PSVT or PAT.  Nothing lasting very long.  We discussed vagal maneuvers. She actually is not on beta-blocker which would be a potential option if additional blood pressure control as needed.

## 2019-08-11 NOTE — Assessment & Plan Note (Signed)
No significant finding on echo to explain murmur.  Wonder if this could potentially related to elevated pulmonary pressures noted by RA dilation.

## 2019-08-11 NOTE — Assessment & Plan Note (Signed)
She clearly had some reproducible chest discomfort when I first saw her.  She still having different types of chest pain that is very difficult to determine.  Since she is having dyspnea with chest discomfort on exertion however need to exclude ischemia with her having obesity hypertension and borderline diabetes.  Close metabolic syndrome.  Plan: Ischemic evaluation with coronary CTA and possible FFR.

## 2019-08-11 NOTE — Assessment & Plan Note (Signed)
Prior history of OSA before her bypass surgery, but she remains morbidly obese.  Epworth score is 20.  I suspect that this is mostly complaints of fatigue.  Plan: Polysomnogram.

## 2019-08-11 NOTE — Assessment & Plan Note (Addendum)
On amlodipine as a reasonable first option.  Blood pressure looks pretty well controlled.  Continue to monitor.  I suspect that we can probably use of underlying diuretic since she is not using furosemide every day.

## 2019-08-11 NOTE — Assessment & Plan Note (Signed)
She probably could benefit from nutrition counseling.  She has had gastric bypass and still having more issues.  Unfortunately her joint related pains limit her activity level.  I would like to see if she can get into doing water aerobics.

## 2019-08-13 DIAGNOSIS — M545 Low back pain: Secondary | ICD-10-CM | POA: Diagnosis not present

## 2019-08-13 DIAGNOSIS — R262 Difficulty in walking, not elsewhere classified: Secondary | ICD-10-CM | POA: Diagnosis not present

## 2019-08-13 DIAGNOSIS — R2681 Unsteadiness on feet: Secondary | ICD-10-CM | POA: Diagnosis not present

## 2019-08-13 DIAGNOSIS — M6281 Muscle weakness (generalized): Secondary | ICD-10-CM | POA: Diagnosis not present

## 2019-08-16 DIAGNOSIS — R262 Difficulty in walking, not elsewhere classified: Secondary | ICD-10-CM | POA: Diagnosis not present

## 2019-08-16 DIAGNOSIS — M545 Low back pain: Secondary | ICD-10-CM | POA: Diagnosis not present

## 2019-08-16 DIAGNOSIS — N390 Urinary tract infection, site not specified: Secondary | ICD-10-CM | POA: Diagnosis not present

## 2019-08-16 DIAGNOSIS — N3020 Other chronic cystitis without hematuria: Secondary | ICD-10-CM | POA: Diagnosis not present

## 2019-08-16 DIAGNOSIS — M6281 Muscle weakness (generalized): Secondary | ICD-10-CM | POA: Diagnosis not present

## 2019-08-16 DIAGNOSIS — N3946 Mixed incontinence: Secondary | ICD-10-CM | POA: Diagnosis not present

## 2019-08-16 DIAGNOSIS — R2681 Unsteadiness on feet: Secondary | ICD-10-CM | POA: Diagnosis not present

## 2019-08-20 DIAGNOSIS — M6281 Muscle weakness (generalized): Secondary | ICD-10-CM | POA: Diagnosis not present

## 2019-08-20 DIAGNOSIS — M545 Low back pain: Secondary | ICD-10-CM | POA: Diagnosis not present

## 2019-08-20 DIAGNOSIS — R2681 Unsteadiness on feet: Secondary | ICD-10-CM | POA: Diagnosis not present

## 2019-08-20 DIAGNOSIS — R262 Difficulty in walking, not elsewhere classified: Secondary | ICD-10-CM | POA: Diagnosis not present

## 2019-08-23 DIAGNOSIS — R2681 Unsteadiness on feet: Secondary | ICD-10-CM | POA: Diagnosis not present

## 2019-08-23 DIAGNOSIS — M6281 Muscle weakness (generalized): Secondary | ICD-10-CM | POA: Diagnosis not present

## 2019-08-23 DIAGNOSIS — M545 Low back pain: Secondary | ICD-10-CM | POA: Diagnosis not present

## 2019-08-23 DIAGNOSIS — R262 Difficulty in walking, not elsewhere classified: Secondary | ICD-10-CM | POA: Diagnosis not present

## 2019-08-27 ENCOUNTER — Ambulatory Visit: Payer: Medicare HMO | Admitting: Family Medicine

## 2019-08-27 DIAGNOSIS — M79672 Pain in left foot: Secondary | ICD-10-CM | POA: Diagnosis not present

## 2019-08-27 DIAGNOSIS — M79671 Pain in right foot: Secondary | ICD-10-CM | POA: Diagnosis not present

## 2019-08-28 DIAGNOSIS — M545 Low back pain: Secondary | ICD-10-CM | POA: Diagnosis not present

## 2019-08-28 DIAGNOSIS — R262 Difficulty in walking, not elsewhere classified: Secondary | ICD-10-CM | POA: Diagnosis not present

## 2019-08-28 DIAGNOSIS — R2681 Unsteadiness on feet: Secondary | ICD-10-CM | POA: Diagnosis not present

## 2019-08-28 DIAGNOSIS — M6281 Muscle weakness (generalized): Secondary | ICD-10-CM | POA: Diagnosis not present

## 2019-08-30 DIAGNOSIS — R262 Difficulty in walking, not elsewhere classified: Secondary | ICD-10-CM | POA: Diagnosis not present

## 2019-08-30 DIAGNOSIS — M6281 Muscle weakness (generalized): Secondary | ICD-10-CM | POA: Diagnosis not present

## 2019-08-30 DIAGNOSIS — R2681 Unsteadiness on feet: Secondary | ICD-10-CM | POA: Diagnosis not present

## 2019-08-30 DIAGNOSIS — M545 Low back pain: Secondary | ICD-10-CM | POA: Diagnosis not present

## 2019-09-04 DIAGNOSIS — R262 Difficulty in walking, not elsewhere classified: Secondary | ICD-10-CM | POA: Diagnosis not present

## 2019-09-04 DIAGNOSIS — M545 Low back pain: Secondary | ICD-10-CM | POA: Diagnosis not present

## 2019-09-04 DIAGNOSIS — M6281 Muscle weakness (generalized): Secondary | ICD-10-CM | POA: Diagnosis not present

## 2019-09-04 DIAGNOSIS — R2681 Unsteadiness on feet: Secondary | ICD-10-CM | POA: Diagnosis not present

## 2019-09-05 ENCOUNTER — Encounter: Payer: Self-pay | Admitting: Family Medicine

## 2019-09-05 ENCOUNTER — Ambulatory Visit: Payer: Medicare HMO | Admitting: Family Medicine

## 2019-09-05 ENCOUNTER — Other Ambulatory Visit: Payer: Self-pay

## 2019-09-05 VITALS — BP 104/64 | HR 70 | Ht 67.0 in | Wt 268.2 lb

## 2019-09-05 DIAGNOSIS — M5416 Radiculopathy, lumbar region: Secondary | ICD-10-CM | POA: Diagnosis not present

## 2019-09-05 DIAGNOSIS — M7061 Trochanteric bursitis, right hip: Secondary | ICD-10-CM | POA: Diagnosis not present

## 2019-09-05 DIAGNOSIS — M25522 Pain in left elbow: Secondary | ICD-10-CM

## 2019-09-05 NOTE — Patient Instructions (Signed)
Thank you for coming in today. Get elbow xray either today or back here in the near future.  I will ask PT to add hip exercises.  Continue voltaren gel or Pennsaid to the elbow up to 4x daily.  Recheck with Dr Tamala Julian or me in 2-4 weeks to follow up this hip, back or elbow.   Call or go to the ER if you develop a large red swollen joint with extreme pain or oozing puss.    Hip Bursitis  Hip bursitis is swelling of a fluid-filled sac (bursa) in your hip joint. This swelling (inflammation) can be painful. This condition may come and go over time. What are the causes?  Injury to the hip.  Overuse of the muscles that surround the hip joint.  An earlier injury or surgery of the hip.  Arthritis or gout.  Diabetes.  Thyroid disease.  Infection.  In some cases, the cause may not be known. What are the signs or symptoms?  Mild or moderate pain in the hip area. Pain may get worse with movement.  Tenderness and swelling of the hip, especially on the outer side of the hip.  In rare cases, the bursa may become infected. This may cause: ? A fever. ? Warmth and redness in the area. Symptoms may come and go. How is this treated? This condition is treated by resting, icing, applying pressure (compression), and raising (elevating) the injured area. You may hear this called the RICE treatment. Treatment may also include:  Using crutches.  Draining fluid out of the bursa to help relieve swelling.  Giving a shot of (injecting) medicine that helps to reduce swelling (cortisone).  Other medicines if the bursa is infected. Follow these instructions at home: Managing pain, stiffness, and swelling   If told, put ice on the painful area. ? Put ice in a plastic bag. ? Place a towel between your skin and the bag. ? Leave the ice on for 20 minutes, 2-3 times a day. ? Raise (elevate) your hip above the level of your heart as much as you can without pain. To do this, try putting a pillow  under your hips while you lie down. Stop if this causes pain. Activity  Return to your normal activities as told by your doctor. Ask your doctor what activities are safe for you.  Rest and protect your hip as much as you can until you feel better. General instructions  Take over-the-counter and prescription medicines only as told by your doctor.  Wear wraps that put pressure on your hip (compression wraps) only as told by your doctor.  Do not use your hip to support your body weight until your doctor says that you can.  Use crutches as told by your doctor.  Gently rub and stretch your injured area as often as is comfortable.  Keep all follow-up visits as told by your doctor. This is important. How is this prevented?  Exercise regularly, as told by your doctor.  Warm up and stretch before being active.  Cool down and stretch after being active.  Avoid activities that bother your hip or cause pain.  Avoid sitting down for long periods at a time. Contact a doctor if:  You have a fever.  You get new symptoms.  You have trouble walking.  You have trouble doing everyday activities.  You have pain that gets worse.  You have pain that does not get better with medicine.  You get red skin on your hip area.  You get a feeling of warmth in your hip area. Get help right away if:  You cannot move your hip.  You have very bad pain. Summary  Hip bursitis is swelling of a fluid-filled sac (bursa) in your hip.  Hip bursitis can be painful.  Symptoms often come and go over time.  This condition is treated with rest, ice, compression, elevation, and medicines. This information is not intended to replace advice given to you by your health care provider. Make sure you discuss any questions you have with your health care provider. Document Revised: 12/12/2017 Document Reviewed: 12/12/2017 Elsevier Patient Education  [place].

## 2019-09-05 NOTE — Progress Notes (Signed)
I, Wendy Poet, LAT, ATC, am serving as scribe for Dr. Lynne Leader.  [name] is a 64 y.o. female who presents to [place] at [facility] today for f/u of low back pain following a R L3 nerve root block and transforaminal epidural on 07/26/19.   She notes majority of her pain is located at the right lateral hip and going to the lateral thigh.  She does not have much pain radiating down the posterior aspect of her leg past the knee. She notes the L3 epidural steroid injection was marginally helpful.  She was last seen by Dr. Tamala Julian on 07/23/19 and was referred for a lumbar epidural and for PT particularly for aquatic therapy mostly focused on her back pain..  She has a history of a L4-S1 fusion and has diagnosed lumbar spinal stenosis.  Since her last visit w/ Dr. Tamala Julian and since her epidural, pt reports that the epidural helped "for a minute" but now reports the same if not increased pain in her lower back.  She is also having pain in her B LEs to the knee, R >L.  She denies any numbness/tingling in her B LEs but does note some mild weakness.   Diagnostic testing: L-spine XR- 07/03/19   Pertinent review of systems: No fevers or chills  Relevant historical information: Hypertension sleep apnea obesity   Exam:  BP 104/64 (BP Location: Right Arm, Patient Position: Sitting, Cuff Size: Large)   Pulse 70   Ht 5\' 7"  (1.702 m)   Wt 268 lb 3.2 oz (121.7 kg)   SpO2 97%   BMI 42.01 kg/m  General: Well Developed, well nourished, and in no acute distress.   MSK:  Right hip normal-appearing Normal motion. Tender palpation greater trochanter. Hip abduction strength significantly diminished 3/5. Antalgic gait.   Hip greater trochanteric injection: Right Consent obtained and timeout performed. Area of maximum tenderness palpated and identified. Skin cleaned with alcohol, cold spray applied. A spinal needle was used to access the greater trochanteric bursa. 40 mg  of Kenalog and 3 mL of Marcaine were used to inject the trochanteric bursa. Patient tolerated the procedure well.    Lab and Radiology Results EXAM: LUMBAR SPINE - 2-3 VIEW  COMPARISON:  January 26, 2017  FINDINGS: Frontal, lateral, and spot lumbosacral lateral images were obtained. There are 4 non-rib-bearing lumbar type vertebral bodies. There is postoperative pedicle screw fixation at L3, L4, and S1 with disc spacer noted at L3-4. Appearance is stable compared to prior studies. Note that the L3 pedicle screws do not reach the vertebral body, a stable finding.  No fracture or spondylolisthesis. Disc space narrowing at L3-4 and L4-S1 is stable. No erosive change. Filter noted in inferior vena cava.  IMPRESSION: Stable postoperative change with support hardware intact and stable. Arthropathy at L3-4 and L4-S1 appears stable. No fracture or spondylolisthesis.   Electronically Signed   By: Lowella Grip III M.D.   On: 07/04/2019 08:30 I, Lynne Leader, personally (independently) visualized and performed the interpretation of the images attached in this note.     Assessment and Plan: 64 y.o. female with back and leg pain.  Patient has multifactorial back and leg pain.  Her back pain has improved fairly reasonably well with aquatic physical therapy.  However she is having pain located predominantly at the right lateral hip extending down to the lateral thigh.  She had a trial of epidural steroid injection at L3 on April 8 which helped only a little.  I believe the majority of her pain today is due to trochanteric bursitis/hip abductor tendinopathy and less likely to be due to classic lumbar radiculopathy.  Discussed the differential diagnosis and treatment plan and options with the patient today.  Proceeded with greater trochanter injection in clinic and will modify existing aquatic physical therapy orders to focus also on hip abductor stretching and  strengthening.  We will check back in a few weeks with myself or Dr. Tamala Julian.  If not better than or not any improvement then would recommend lumbar MRI to evaluate radiculopathy better.    Orders Placed This Encounter  Procedures  . DG ELBOW COMPLETE LEFT (3+VIEW)    Standing Status:   Future    Standing Expiration Date:   11/04/2020    Order Specific Question:   Reason for Exam (SYMPTOM  OR DIAGNOSIS REQUIRED)    Answer:   eval left elbow pain    Order Specific Question:   Preferred imaging location?    Answer:   [facility]    Order Specific Question:   Radiology Contrast Protocol - do NOT remove file path    Answer:   [email or web address]  . Ambulatory referral to Physical Therapy    Referral Priority:   Routine    Referral Type:   Physical Medicine    Referral Reason:   Specialty Services Required    Requested Specialty:   Physical Therapy    Number of Visits Requested:   1   No orders of the defined types were placed in this encounter.    Discussed warning signs or symptoms. Please see discharge instructions. Patient expresses understanding.   The above documentation has been reviewed and is accurate and complete Lynne Leader, M.D.

## 2019-09-07 DIAGNOSIS — M545 Low back pain: Secondary | ICD-10-CM | POA: Diagnosis not present

## 2019-09-07 DIAGNOSIS — R262 Difficulty in walking, not elsewhere classified: Secondary | ICD-10-CM | POA: Diagnosis not present

## 2019-09-07 DIAGNOSIS — R2681 Unsteadiness on feet: Secondary | ICD-10-CM | POA: Diagnosis not present

## 2019-09-07 DIAGNOSIS — M6281 Muscle weakness (generalized): Secondary | ICD-10-CM | POA: Diagnosis not present

## 2019-09-11 DIAGNOSIS — M6281 Muscle weakness (generalized): Secondary | ICD-10-CM | POA: Diagnosis not present

## 2019-09-11 DIAGNOSIS — R2681 Unsteadiness on feet: Secondary | ICD-10-CM | POA: Diagnosis not present

## 2019-09-11 DIAGNOSIS — M545 Low back pain: Secondary | ICD-10-CM | POA: Diagnosis not present

## 2019-09-11 DIAGNOSIS — R262 Difficulty in walking, not elsewhere classified: Secondary | ICD-10-CM | POA: Diagnosis not present

## 2019-09-13 DIAGNOSIS — R2681 Unsteadiness on feet: Secondary | ICD-10-CM | POA: Diagnosis not present

## 2019-09-13 DIAGNOSIS — R262 Difficulty in walking, not elsewhere classified: Secondary | ICD-10-CM | POA: Diagnosis not present

## 2019-09-13 DIAGNOSIS — M545 Low back pain: Secondary | ICD-10-CM | POA: Diagnosis not present

## 2019-09-13 DIAGNOSIS — M6281 Muscle weakness (generalized): Secondary | ICD-10-CM | POA: Diagnosis not present

## 2019-09-14 ENCOUNTER — Ambulatory Visit (INDEPENDENT_AMBULATORY_CARE_PROVIDER_SITE_OTHER): Payer: Medicare HMO | Admitting: Internal Medicine

## 2019-09-14 ENCOUNTER — Other Ambulatory Visit: Payer: Self-pay

## 2019-09-14 ENCOUNTER — Encounter: Payer: Self-pay | Admitting: Internal Medicine

## 2019-09-14 ENCOUNTER — Telehealth: Payer: Self-pay | Admitting: Internal Medicine

## 2019-09-14 VITALS — BP 128/80 | HR 76 | Temp 98.1°F | Ht 66.0 in

## 2019-09-14 DIAGNOSIS — R252 Cramp and spasm: Secondary | ICD-10-CM | POA: Diagnosis not present

## 2019-09-14 DIAGNOSIS — R5383 Other fatigue: Secondary | ICD-10-CM | POA: Diagnosis not present

## 2019-09-14 DIAGNOSIS — M791 Myalgia, unspecified site: Secondary | ICD-10-CM | POA: Diagnosis not present

## 2019-09-14 NOTE — Assessment & Plan Note (Signed)
Checking CBC, CMP, CK.

## 2019-09-14 NOTE — Assessment & Plan Note (Signed)
Checking CBC, vitamin B12, vitamin D, CMP to determine etiology. May be related to recent weight gain. Will monitor with continuing PT and nutrition referral.

## 2019-09-14 NOTE — Assessment & Plan Note (Signed)
Referral to nutrition for dietary advice. She prefers not to change dose of lyrica at this time due to good results with it although we did discuss it can cause weight gain.

## 2019-09-14 NOTE — Telephone Encounter (Signed)
Error no note needed °

## 2019-09-14 NOTE — Progress Notes (Signed)
   Subjective:   Patient ID: [name], female    DOB: 1955-11-29, 64 y.o.   MRN: [id number]  HPI The patient is a 64 YO female coming in for concerns about morbid obesity (weight is increasing, she does not eat great, concerned about lyrica causing the weight gain, has gotten a lot of relief from lyrica and does not want to change it) and muscle cramps (last night, cramps from her toes to hips, she was able to dangle legs off bed which helped some, she got recurrence several times in the night, did use ice for this which helped some, has not been drinking as much fluids recently, did work with PT yesterday but denies injury or new stretches) and fatigue (low energy and getting tired easily, sleeping okay, waking rested, some weight gain recently).   Review of Systems  Constitutional: Positive for fatigue.  HENT: Negative.   Eyes: Negative.   Respiratory: Negative for cough, chest tightness and shortness of breath.   Cardiovascular: Negative for chest pain, palpitations and leg swelling.  Gastrointestinal: Negative for abdominal distention, abdominal pain, constipation, diarrhea, nausea and vomiting.  Musculoskeletal: Positive for myalgias.  Skin: Negative.   Neurological: Negative.   Psychiatric/Behavioral: Negative.     Objective:  Physical Exam Constitutional:      Appearance: She is well-developed. She is obese.  HENT:     Head: Normocephalic and atraumatic.  Cardiovascular:     Rate and Rhythm: Normal rate and regular rhythm.  Pulmonary:     Effort: Pulmonary effort is normal. No respiratory distress.     Breath sounds: Normal breath sounds. No wheezing or rales.  Abdominal:     General: Bowel sounds are normal. There is no distension.     Palpations: Abdomen is soft.     Tenderness: There is no abdominal tenderness. There is no rebound.  Musculoskeletal:        General: Tenderness present.     Cervical back: Normal range of motion.  Skin:    General: Skin is warm  and dry.  Neurological:     Mental Status: She is alert and oriented to person, place, and time.     Coordination: Coordination normal.     Vitals:   09/14/19 1557  BP: 128/80  Pulse: 76  Temp: 98.1 F (36.7 C)  SpO2: 99%  Height: 5\' 6"  (1.676 m)    This visit occurred during the SARS-CoV-2 public health emergency.  Safety protocols were in place, including screening questions prior to the visit, additional usage of staff PPE, and extensive cleaning of exam room while observing appropriate contact time as indicated for disinfecting solutions.   Assessment & Plan:

## 2019-09-14 NOTE — Patient Instructions (Signed)
We will check the labs today.   Work on drinking more fluids especially on physical therapy days.

## 2019-09-15 LAB — CBC
HCT: 36.1 % (ref 35.0–45.0)
Hemoglobin: 11.4 g/dL — ABNORMAL LOW (ref 11.7–15.5)
MCH: 27.5 pg (ref 27.0–33.0)
MCHC: 31.6 g/dL — ABNORMAL LOW (ref 32.0–36.0)
MCV: 87.2 fL (ref 80.0–100.0)
MPV: 10.6 fL (ref 7.5–12.5)
Platelets: 227 10*3/uL (ref 140–400)
RBC: 4.14 10*6/uL (ref 3.80–5.10)
RDW: 14.9 % (ref 11.0–15.0)
WBC: 7.9 10*3/uL (ref 3.8–10.8)

## 2019-09-15 LAB — COMPREHENSIVE METABOLIC PANEL
AG Ratio: 1.5 (calc) (ref 1.0–2.5)
ALT: 58 U/L — ABNORMAL HIGH (ref 6–29)
AST: 31 U/L (ref 10–35)
Albumin: 3.7 g/dL (ref 3.6–5.1)
Alkaline phosphatase (APISO): 195 U/L — ABNORMAL HIGH (ref 37–153)
BUN: 19 mg/dL (ref 7–25)
CO2: 17 mmol/L — ABNORMAL LOW (ref 20–32)
Calcium: 8.7 mg/dL (ref 8.6–10.4)
Chloride: 112 mmol/L — ABNORMAL HIGH (ref 98–110)
Creat: 0.94 mg/dL (ref 0.50–0.99)
Globulin: 2.4 g/dL (calc) (ref 1.9–3.7)
Glucose, Bld: 102 mg/dL — ABNORMAL HIGH (ref 65–99)
Potassium: 4.4 mmol/L (ref 3.5–5.3)
Sodium: 142 mmol/L (ref 135–146)
Total Bilirubin: 0.3 mg/dL (ref 0.2–1.2)
Total Protein: 6.1 g/dL (ref 6.1–8.1)

## 2019-09-15 LAB — VITAMIN D 25 HYDROXY (VIT D DEFICIENCY, FRACTURES): Vit D, 25-Hydroxy: 12 ng/mL — ABNORMAL LOW (ref 30–100)

## 2019-09-15 LAB — VITAMIN B12: Vitamin B-12: 1824 pg/mL — ABNORMAL HIGH (ref 200–1100)

## 2019-09-15 LAB — CK: Total CK: 73 U/L (ref 29–143)

## 2019-09-18 ENCOUNTER — Other Ambulatory Visit: Payer: Self-pay | Admitting: Internal Medicine

## 2019-09-18 DIAGNOSIS — R2681 Unsteadiness on feet: Secondary | ICD-10-CM | POA: Diagnosis not present

## 2019-09-18 DIAGNOSIS — M545 Low back pain: Secondary | ICD-10-CM | POA: Diagnosis not present

## 2019-09-18 DIAGNOSIS — R262 Difficulty in walking, not elsewhere classified: Secondary | ICD-10-CM | POA: Diagnosis not present

## 2019-09-18 DIAGNOSIS — M6281 Muscle weakness (generalized): Secondary | ICD-10-CM | POA: Diagnosis not present

## 2019-09-18 MED ORDER — VITAMIN D (ERGOCALCIFEROL) 1.25 MG (50000 UNIT) PO CAPS: 50000.0000 [IU] | ORAL_CAPSULE | ORAL | 0 refills | Status: DC

## 2019-09-20 ENCOUNTER — Telehealth (HOSPITAL_COMMUNITY): Payer: Self-pay | Admitting: *Deleted

## 2019-09-20 DIAGNOSIS — M545 Low back pain: Secondary | ICD-10-CM | POA: Diagnosis not present

## 2019-09-20 DIAGNOSIS — R262 Difficulty in walking, not elsewhere classified: Secondary | ICD-10-CM | POA: Diagnosis not present

## 2019-09-20 DIAGNOSIS — R2681 Unsteadiness on feet: Secondary | ICD-10-CM | POA: Diagnosis not present

## 2019-09-20 DIAGNOSIS — M6281 Muscle weakness (generalized): Secondary | ICD-10-CM | POA: Diagnosis not present

## 2019-09-20 NOTE — Telephone Encounter (Signed)
Attempted to call patient regarding upcoming cardiac CT appointment. Left message on voicemail with name and callback number  Elanore Talcott Tai RN Navigator Cardiac Imaging [facility] [phone] Office [phone] Cell  

## 2019-09-21 ENCOUNTER — Ambulatory Visit (HOSPITAL_COMMUNITY)
Admission: RE | Admit: 2019-09-21 | Discharge: 2019-09-21 | Disposition: A | Discharge status: Home / Self Care | Payer: Medicare HMO | Source: Ambulatory Visit | Attending: Cardiology | Admitting: Cardiology

## 2019-09-21 ENCOUNTER — Telehealth: Payer: Self-pay | Admitting: Cardiology

## 2019-09-21 ENCOUNTER — Other Ambulatory Visit: Payer: Self-pay

## 2019-09-21 DIAGNOSIS — R000 Tachycardia, unspecified: Secondary | ICD-10-CM | POA: Insufficient documentation

## 2019-09-21 DIAGNOSIS — R0609 Other forms of dyspnea: Secondary | ICD-10-CM

## 2019-09-21 DIAGNOSIS — I2510 Atherosclerotic heart disease of native coronary artery without angina pectoris: Secondary | ICD-10-CM | POA: Insufficient documentation

## 2019-09-21 DIAGNOSIS — R011 Cardiac murmur, unspecified: Secondary | ICD-10-CM | POA: Diagnosis not present

## 2019-09-21 DIAGNOSIS — R0600 Dyspnea, unspecified: Secondary | ICD-10-CM | POA: Insufficient documentation

## 2019-09-21 DIAGNOSIS — R0602 Shortness of breath: Secondary | ICD-10-CM | POA: Insufficient documentation

## 2019-09-21 DIAGNOSIS — R079 Chest pain, unspecified: Secondary | ICD-10-CM | POA: Insufficient documentation

## 2019-09-21 HISTORY — PX: CT CTA CORONARY W/CA SCORE W/CM &/OR WO/CM: HXRAD787

## 2019-09-21 MED ORDER — IOHEXOL 350 MG/ML SOLN
80.0000 mL | Freq: Once | INTRAVENOUS | Status: AC | PRN
  Administered 2019-09-21: 80 mL via INTRAVENOUS

## 2019-09-21 MED ORDER — NITROGLYCERIN 0.4 MG SL SUBL: 0.8000 mg | SUBLINGUAL_TABLET | Freq: Once | SUBLINGUAL | Status: AC

## 2019-09-21 MED ORDER — NITROGLYCERIN 0.4 MG SL SUBL
SUBLINGUAL_TABLET | SUBLINGUAL | Status: AC
  Administered 2019-09-21: 0.8 mg via SUBLINGUAL
  Filled 2019-09-21: qty 2

## 2019-09-21 NOTE — Telephone Encounter (Signed)
Left message for patient to call and schedule follow up appointment with Dr. Ellyn Hack for f/u cardiac ct

## 2019-09-26 ENCOUNTER — Other Ambulatory Visit: Payer: Self-pay

## 2019-09-26 ENCOUNTER — Ambulatory Visit: Payer: Medicare HMO | Admitting: Family Medicine

## 2019-09-26 ENCOUNTER — Encounter: Payer: Self-pay | Admitting: Family Medicine

## 2019-09-26 VITALS — BP 150/80 | HR 90 | Ht 66.0 in | Wt 267.0 lb

## 2019-09-26 DIAGNOSIS — G8929 Other chronic pain: Secondary | ICD-10-CM

## 2019-09-26 DIAGNOSIS — M545 Low back pain, unspecified: Secondary | ICD-10-CM

## 2019-09-26 DIAGNOSIS — M5416 Radiculopathy, lumbar region: Secondary | ICD-10-CM

## 2019-09-26 NOTE — Progress Notes (Signed)
[facility] [address] [place] Phone: [phone] Subjective:   I Kandace Blitz am serving as a Education administrator for Dr. Hulan Saas.  This visit occurred during the SARS-CoV-2 public health emergency.  Safety protocols were in place, including screening questions prior to the visit, additional usage of staff PPE, and extensive cleaning of exam room while observing appropriate contact time as indicated for disinfecting solutions.   I'm seeing this patient by the request  of:  Hoyt Koch, MD  CC: Low back pain follow-up  RRN:[id number]   07/23/2019 Worsening symptoms again.  Known spinal stenosis.  Has failed conservative therapy and did have surgical intervention previously.  Discussed with patient about icing regimen, home exercise, which activities to do which wants to avoid.  I do believe that patient does need epidural at this time.  This will hopefully be diagnostic as well as therapeutic.  Worsening symptoms MRI would be necessary otherwise.  Continue the same medications we discussed medication management and unable to add anything secondary to potential interactions.  Follow-up with me again in 3 to 4 weeks after epidural  Update 09/26/2019 SAPHIRA LAHMANN is a 64 y.o. female coming in with complaint of back pain. Patient states she is not doing well. States she started feeling better but now the pain is bothering her. Still going to PT which helps.  Patient feels that the greater trochanteric injection that she was given by the other provider did not make any significant improvement.  Patient states that unfortunately continues to have discomfort and pain which is frustrating the patient. Patient would like to be more active so she can lose weight but is finding it difficult secondary to the pain but does feel the physical therapy with aquatic therapy does give her some relief for at least days    Past Medical History:  Diagnosis  Date   Anemia    takes Ferrous Sulfate daily   Anxiety    takes Citalopram daily   Arthritis    Asthma    2004-prior to gastric bypass and no problems since   CHF (congestive heart failure) ([place])    takes Lasix daily as needed   Chronic back pain    spondylolisthesis/stenosis/radiculopathy   Complication of anesthesia yrs ago   slow to wake up   Depression    Diabetes ([place])    DVT (deep venous thrombosis) ([place]) 01/17/2013   past hx. -tx.5-6 yrs ago bilateral legs, occ. sporadic swelling, has IVC filter implanted   Dysrhythmia    "heart tends to flutter"   Fibromyalgia    Fracture    right foot and is in a cam boot   Gallstones    GERD (gastroesophageal reflux disease)    hx of-no meds now   Heart murmur    History of bronchitis 2012 or 2013   History of colon polyps    Hypertension    takes Metoprolol daily   Insomnia    takes Melatonin daily   Pelvic floor dysfunction    Peripheral neuropathy    Pneumonia 90's   hx of   S/P gastric bypass 2003   Sleep apnea    no cpap used in many yrs after weight lost-no machine now   Urinary frequency    Urinary urgency    Vitamin D deficiency    Past Surgical History:  Procedure Laterality Date   BALLOON DILATION N/A 01/31/2013   Procedure: BALLOON DILATION;  Surgeon: Arta Silence, MD;  Location: [facility];  Service: Endoscopy;  Laterality: N/A;   CARDIAC EVENT MONITOR  03/2019   Predominantly sinus rhythm.  Rates range from 48-124 bpm.  Average 74 bpm.  Frequent short bursts (3-15 beats) PAT/PSVT--not indicated as being symptomatic on diary.  Otherwise rare PACs and PVCs.   CHOLECYSTECTOMY  2007   COLONOSCOPY     DILATION AND CURETTAGE OF UTERUS  yrs ago   ESOPHAGOGASTRODUODENOSCOPY (EGD) WITH PROPOFOL  03/01/2012   Procedure: ESOPHAGOGASTRODUODENOSCOPY (EGD) WITH PROPOFOL;  Surgeon: Arta Silence, MD;  Location: [facility];  Service: Endoscopy;  Laterality: N/A;    ESOPHAGOGASTRODUODENOSCOPY (EGD) WITH PROPOFOL N/A 01/31/2013   Procedure: ESOPHAGOGASTRODUODENOSCOPY (EGD) WITH PROPOFOL;  Surgeon: Arta Silence, MD;  Location: [facility];  Service: Endoscopy;  Laterality: N/A;   ESOPHAGOGASTRODUODENOSCOPY (EGD) WITH PROPOFOL N/A 09/02/2015   Procedure: ESOPHAGOGASTRODUODENOSCOPY (EGD) WITH PROPOFOL;  Surgeon: Gatha Mayer, MD;  Location: [facility];  Service: Endoscopy;  Laterality: N/A;   GASTRIC BY-PASS  2004   HERNIA REPAIR  2005   INSERTION OF VENA CAVA FILTER  01-17-13   inserted 2004- "abdomen"   LIGAMENT REPAIR Right 1987   Rt. knee scope   MAXIMUM ACCESS (MAS)POSTERIOR LUMBAR INTERBODY FUSION (PLIF) 2 LEVEL N/A 06/07/2014   Procedure: L4-5 L5-S1 FOR MAXIMUM ACCESS (MAS) POSTERIOR LUMBAR INTERBODY FUSION ;  Surgeon: Erline Levine, MD;  Location: [facility];  Service: Neurosurgery;  Laterality: N/A;  L4-5 L5-S1 FOR MAXIMUM ACCESS (MAS) POSTERIOR LUMBAR INTERBODY FUSION    TONSILLECTOMY     as child   TRANSTHORACIC ECHOCARDIOGRAM  03/2019   EF 60 to 65%.  Mild LVH.  No R WMA.  Normal RV size.  Normal LA, mild RA dilation.  No aortic valve stenosis or sclerosis.   Social History   Socioeconomic History   Marital status: Widowed    Spouse name: Not on file   Number of children: 0   Years of education: college   Highest education level: Not on file  Occupational History   Occupation: retired  Tobacco Use   Smoking status: Former Smoker    Packs/day: 0.50    Years: 10.00    Pack years: 5.00    Types: Cigarettes    Quit date: 04/19/2009    Years since quitting: 10.4   Smokeless tobacco: Never Used  Substance and Sexual Activity   Alcohol use: Yes    Alcohol/week: 0.0 standard drinks    Comment: occ. social- wine-1 drink monthly   Drug use: No   Sexual activity: Not Currently  Other Topics Concern   Not on file  Social History Narrative   Widow.  No kids.  Lives with her mother who is 7.  She does walk with a  cane.  She is a retired Investment banker, corporate   She quit smoking in 2003   Social Determinants of Health   Financial Resource Strain:    Difficulty of Paying Living Expenses:   Food Insecurity:    Worried About Charity fundraiser in the Last Year:    Arboriculturist in the Last Year:   Transportation Needs:    Film/video editor (Medical):    Lack of Transportation (Non-Medical):   Physical Activity:    Days of Exercise per Week:    Minutes of Exercise per Session:   Stress:    Feeling of Stress :   Social Connections:    Frequency of Communication with Friends and Family:    Frequency of Social Gatherings with Friends  and Family:    Attends Religious Services:    Active Member of Clubs or Organizations:    Attends Music therapist:    Marital Status:    Allergies  Allergen Reactions   Carafate [Sucralfate] Hives, Itching and Other (See Comments)    Angioedema      Sulfonamide Derivatives Rash    Syncope   Family History  Problem Relation Age of Onset   Diabetes Mother    Atrial fibrillation Mother    Hypertension Mother    Heart disease Father        Unsure of details   Hypertension Father    Prostate cancer Father    Alzheimer's disease Father    Kidney disease Brother    Healthy Sister    Diabetes Maternal Grandmother    Heart disease Maternal Grandmother    Stroke Maternal Grandfather 2   Hypertension Maternal Grandfather    Hypertension Paternal Grandmother    Alzheimer's disease Paternal Grandmother    Colon cancer Neg Hx    Colon polyps Neg Hx    Stomach cancer Neg Hx    Esophageal cancer Neg Hx    Pancreatic cancer Neg Hx    Liver disease Neg Hx      Current Outpatient Medications (Cardiovascular):    amLODipine (NORVASC) 10 MG tablet, TAKE 1 TABLET EVERY DAY   furosemide (LASIX) 40 MG tablet, TAKE 1 TABLET DAILY AS NEEDED FOR EDEMA   metoprolol tartrate (LOPRESSOR) 50 MG tablet, Take 2  tablets (100 mg total) by mouth once for 1 dose. TAKE ONE HOUR PRIOR TO  SCHEDULE CARDIAC TEST  Current Outpatient Medications (Respiratory):    albuterol (VENTOLIN HFA) 108 (90 Base) MCG/ACT inhaler, INHALE 2 PUFFS INTO THE LUNGS EVERY 6 HOURS AS NEEDED FOR WHEEZING OR SHORTNESS OF BREATH  Current Outpatient Medications (Analgesics):    traMADol (ULTRAM) 50 MG tablet, TAKE 1 TABLET(50 MG) BY MOUTH FOUR TIMES DAILY   Current Outpatient Medications (Other):    ACCU-CHEK SOFTCLIX LANCETS lancets, CHECK BLOOD SUGARS TWICE DAILY   Alcohol Swabs (B-D SINGLE USE SWABS REGULAR) PADS, USE TO WIPE AREA TO CHECK BLOOD SUGAR DAILY    BLACK COHOSH PO, Take 1 tablet by mouth daily.   Blood Glucose Calibration (ACCU-CHEK AVIVA) SOLN, Use as directed   Blood Glucose Monitoring Suppl (ACCU-CHEK AVIVA PLUS) w/Device KIT, USE AS DIRECTED  TO CHECK BLOOD SUGAR EVERY DAY   DULoxetine (CYMBALTA) 30 MG capsule, TAKE 1 TO 2 CAPSULES EVERY DAY   glucose blood (ACCU-CHEK AVIVA PLUS) test strip, 1 each by Other route 2 (two) times daily. Use to check blood sugars twice a day   Multiple Vitamins-Minerals (MULTIVITAMINS THER. W/MINERALS) TABS, Take 1 tablet by mouth daily.    nitrofurantoin, macrocrystal-monohydrate, (MACROBID) 100 MG capsule, Take 1 capsule (100 mg total) by mouth 2 (two) times daily.   ondansetron (ZOFRAN) 4 MG tablet, Take 1 tablet (4 mg total) by mouth every 8 (eight) hours as needed for nausea or vomiting.   pantoprazole (PROTONIX) 40 MG tablet, Take 1 tablet (40 mg total) by mouth daily.   potassium chloride SA (K-DUR) 20 MEQ tablet, TAKE 1 TABLET EVERY DAY   pregabalin (LYRICA) 300 MG capsule, TAKE 1 CAPSULE(300 MG) BY MOUTH TWICE DAILY   traZODone (DESYREL) 50 MG tablet, Take 1 tablet (50 mg total) by mouth at bedtime as needed for sleep.   Vitamin D, Ergocalciferol, (DRISDOL) 1.25 MG (50000 UNIT) CAPS capsule, Take 1 capsule (50,000 Units total) by mouth  every 7 (seven)  days.   Reviewed prior external information including notes and imaging from  primary care provider As well as notes that were available from care everywhere and other healthcare systems.  Past medical history, social, surgical and family history all reviewed in electronic medical record.  No pertanent information unless stated regarding to the chief complaint.   Review of Systems:  No headache, visual changes, nausea, vomiting, diarrhea, constipation, dizziness, abdominal pain, skin rash, fevers, chills, night sweats, weight loss, swollen lymph nodes,  joint swelling, chest pain, shortness of breath, mood changes. POSITIVE muscle aches, body aches  Objective  Blood pressure (!) 150/80, pulse 90, height '5\' 6"'$  (1.676 m), weight 267 lb (121.1 kg), SpO2 97 %.   General: No apparent distress alert and oriented x3 mood and affect normal, dressed appropriately.  Overweight HEENT: Pupils equal, extraocular movements intact  Respiratory: Patient's speak in full sentences and does not appear short of breath  Cardiovascular: 1+ lower extremity edema, non tender, no erythema  Neuro: Cranial nerves II through XII are intact, Gait severely antalgic walking with the aid of a 4 pronged cane Back exam shows poor core strength.  Patient does have decreased range of motion of the legs bilaterally.  4 out of 5 strength.  Difficult to assess deep tendon reflexes.  Patient secondary to body habitus unable to make certain movements on a regular basis.   Impression and Recommendations:     The above documentation has been reviewed and is accurate and complete Lyndal Pulley, DO       Note: This dictation was prepared with Dragon dictation along with smaller phrase technology. Any transcriptional errors that result from this process are unintentional.

## 2019-09-26 NOTE — Patient Instructions (Addendum)
Good to see you Continue PT Lets get MRI of the back to make sure we are at the right level with epidurals Really excited you are starting with healthy weight and wellness Keep up determination  See me again in 2 months

## 2019-09-26 NOTE — Assessment & Plan Note (Signed)
Chronic problem with exacerbation.  Patient is making some mild improvement with the physical therapy which is very helpful for her but unfortunately patient continues to have the radicular symptoms and needed the cane for ambulation.  Patient's quality of life is still decreased.  Patient did not have any significant improvement with the last epidural.  I do feel that advanced imaging could be warranted secondary to past surgical history and need to evaluate for potential adjacent segment disease.  Discussed medication management including being sparingly with patient's tramadol as well as patient's Lyrica and Cymbalta.  Depending on imaging this may change which injections we do otherwise.  Encourage weight loss which patient does seem motivated at the moment.  Follow-up again in 6 to 8 weeks

## 2019-09-28 DIAGNOSIS — M545 Low back pain: Secondary | ICD-10-CM | POA: Diagnosis not present

## 2019-09-28 DIAGNOSIS — M6281 Muscle weakness (generalized): Secondary | ICD-10-CM | POA: Diagnosis not present

## 2019-09-28 DIAGNOSIS — R2681 Unsteadiness on feet: Secondary | ICD-10-CM | POA: Diagnosis not present

## 2019-09-28 DIAGNOSIS — R262 Difficulty in walking, not elsewhere classified: Secondary | ICD-10-CM | POA: Diagnosis not present

## 2019-09-30 ENCOUNTER — Telehealth: Payer: Self-pay | Admitting: *Deleted

## 2019-09-30 NOTE — Telephone Encounter (Signed)
Sleep PA submitted to [facility] via web portal.

## 2019-10-02 ENCOUNTER — Ambulatory Visit (INDEPENDENT_AMBULATORY_CARE_PROVIDER_SITE_OTHER): Payer: Medicare HMO | Admitting: Family Medicine

## 2019-10-02 DIAGNOSIS — R2681 Unsteadiness on feet: Secondary | ICD-10-CM | POA: Diagnosis not present

## 2019-10-02 DIAGNOSIS — M6281 Muscle weakness (generalized): Secondary | ICD-10-CM | POA: Diagnosis not present

## 2019-10-02 DIAGNOSIS — M545 Low back pain: Secondary | ICD-10-CM | POA: Diagnosis not present

## 2019-10-02 DIAGNOSIS — R262 Difficulty in walking, not elsewhere classified: Secondary | ICD-10-CM | POA: Diagnosis not present

## 2019-10-05 ENCOUNTER — Telehealth: Payer: Self-pay | Admitting: *Deleted

## 2019-10-05 ENCOUNTER — Encounter: Payer: Self-pay | Admitting: Internal Medicine

## 2019-10-05 NOTE — Telephone Encounter (Signed)
-----   Message from Roland Earl sent at 08/08/2019 12:02 PM EDT ----- Regarding: Sleep Study Dr. Ellyn Hack

## 2019-10-08 NOTE — Progress Notes (Deleted)
Cardiology Office Note   Date:  10/08/2019   ID:  [name], DOB 05-Nov-1955, MRN [id number]  PCP:  Myrlene Broker, MD  Cardiologist:  Dr. Herbie Baltimore  No chief complaint on file.    History of Present Illness: [name] is a 64 y.o. female who presents for ongoing assessment and management of CHF, shortness of breath, chest tightness, with history of bilateral DVTs status post IVC filter in 2009 no longer on anticoagulation, and chronic lower extremity edema.  She also has morbid obesity.  She did have gastric bypass surgery but continues to struggle with her weight.  She has a history of OSA, and Dr. Herbie Baltimore planned for a polysomnogram.  Was last seen by Dr. Herbie Baltimore on 08/08/2019 on follow-up after having had coronary CT angiogram.  This was completed on 09/21/2019.  Coronary arteries normal coronary artery origin left dominance.  Left main was a large artery was not found to have any plaque, the proximal LAD had mild diffuse calcified plaque with stenosis of 0 to 25%, mildly distal LAD only had luminal irregularities.  The diagonal 1 was a large artery with minimal plaque.  Left circumflex was large lumen dominant artery with minimal plaque.  OM1 OM 2 large branches with minimal irregularities, the PDA and PLA had minimal plaque.  Coronary calcium score was 117, 83 percentile for age and sex matched control,    Past Medical History:  Diagnosis Date  . Anemia    takes Ferrous Sulfate daily  . Anxiety    takes Citalopram daily  . Arthritis   . Asthma    2004-prior to gastric bypass and no problems since  . CHF (congestive heart failure) (HCC)    takes Lasix daily as needed  . Chronic back pain    spondylolisthesis/stenosis/radiculopathy  . Complication of anesthesia yrs ago   slow to wake up  . Depression   . Diabetes (HCC)   . DVT (deep venous thrombosis) (HCC) 01/17/2013   past hx. -tx.5-6 yrs ago bilateral legs, occ. sporadic swelling, has IVC filter  implanted  . Dysrhythmia    "heart tends to flutter"  . Fibromyalgia   . Fracture    right foot and is in a cam boot  . Gallstones   . GERD (gastroesophageal reflux disease)    hx of-no meds now  . Heart murmur   . History of bronchitis 2012 or 2013  . History of colon polyps   . Hypertension    takes Metoprolol daily  . Insomnia    takes Melatonin daily  . Pelvic floor dysfunction   . Peripheral neuropathy   . Pneumonia 90's   hx of  . S/P gastric bypass 2003  . Sleep apnea    no cpap used in many yrs after weight lost-no machine now  . Urinary frequency   . Urinary urgency   . Vitamin D deficiency     Past Surgical History:  Procedure Laterality Date  . BALLOON DILATION N/A 01/31/2013   Procedure: BALLOON DILATION;  Surgeon: Willis Modena, MD;  Location: [facility];  Service: Endoscopy;  Laterality: N/A;  . CARDIAC EVENT MONITOR  03/2019   Predominantly sinus rhythm.  Rates range from 48-124 bpm.  Average 74 bpm.  Frequent short bursts (3-15 beats) PAT/PSVT--not indicated as being symptomatic on diary.  Otherwise rare PACs and PVCs.  . CHOLECYSTECTOMY  2007  . COLONOSCOPY    . DILATION AND CURETTAGE OF UTERUS  yrs ago  . ESOPHAGOGASTRODUODENOSCOPY (EGD) WITH  PROPOFOL  03/01/2012   Procedure: ESOPHAGOGASTRODUODENOSCOPY (EGD) WITH PROPOFOL;  Surgeon: Arta Silence, MD;  Location: [facility];  Service: Endoscopy;  Laterality: N/A;  . ESOPHAGOGASTRODUODENOSCOPY (EGD) WITH PROPOFOL N/A 01/31/2013   Procedure: ESOPHAGOGASTRODUODENOSCOPY (EGD) WITH PROPOFOL;  Surgeon: Arta Silence, MD;  Location: [facility];  Service: Endoscopy;  Laterality: N/A;  . ESOPHAGOGASTRODUODENOSCOPY (EGD) WITH PROPOFOL N/A 09/02/2015   Procedure: ESOPHAGOGASTRODUODENOSCOPY (EGD) WITH PROPOFOL;  Surgeon: Gatha Mayer, MD;  Location: [facility];  Service: Endoscopy;  Laterality: N/A;  . GASTRIC BY-PASS  2004  . HERNIA REPAIR  2005  . INSERTION OF VENA CAVA FILTER  01-17-13   inserted 2004-  "abdomen"  . LIGAMENT REPAIR Right 1987   Rt. knee scope  . MAXIMUM ACCESS (MAS)POSTERIOR LUMBAR INTERBODY FUSION (PLIF) 2 LEVEL N/A 06/07/2014   Procedure: L4-5 L5-S1 FOR MAXIMUM ACCESS (MAS) POSTERIOR LUMBAR INTERBODY FUSION ;  Surgeon: Erline Levine, MD;  Location: [facility];  Service: Neurosurgery;  Laterality: N/A;  L4-5 L5-S1 FOR MAXIMUM ACCESS (MAS) POSTERIOR LUMBAR INTERBODY FUSION   . TONSILLECTOMY     as child  . TRANSTHORACIC ECHOCARDIOGRAM  03/2019   EF 60 to 65%.  Mild LVH.  No R WMA.  Normal RV size.  Normal LA, mild RA dilation.  No aortic valve stenosis or sclerosis.     Current Outpatient Medications  Medication Sig Dispense Refill  . ACCU-CHEK SOFTCLIX LANCETS lancets CHECK BLOOD SUGARS TWICE DAILY 200 each 2  . albuterol (VENTOLIN HFA) 108 (90 Base) MCG/ACT inhaler INHALE 2 PUFFS INTO THE LUNGS EVERY 6 HOURS AS NEEDED FOR WHEEZING OR SHORTNESS OF BREATH 6.7 g 0  . Alcohol Swabs (B-D SINGLE USE SWABS REGULAR) PADS USE TO WIPE AREA TO CHECK BLOOD SUGAR DAILY  100 each 2  . amLODipine (NORVASC) 10 MG tablet TAKE 1 TABLET EVERY DAY 90 tablet 1  . BLACK COHOSH PO Take 1 tablet by mouth daily.    . Blood Glucose Calibration (ACCU-CHEK AVIVA) SOLN Use as directed 3 each 3  . Blood Glucose Monitoring Suppl (ACCU-CHEK AVIVA PLUS) w/Device KIT USE AS DIRECTED  TO CHECK BLOOD SUGAR EVERY DAY 1 kit 0  . DULoxetine (CYMBALTA) 30 MG capsule TAKE 1 TO 2 CAPSULES EVERY DAY 180 capsule 1  . furosemide (LASIX) 40 MG tablet TAKE 1 TABLET DAILY AS NEEDED FOR EDEMA 90 tablet 3  . glucose blood (ACCU-CHEK AVIVA PLUS) test strip 1 each by Other route 2 (two) times daily. Use to check blood sugars twice a day 200 each 2  . metoprolol tartrate (LOPRESSOR) 50 MG tablet Take 2 tablets (100 mg total) by mouth once for 1 dose. TAKE ONE HOUR PRIOR TO  SCHEDULE CARDIAC TEST 2 tablet 0  . Multiple Vitamins-Minerals (MULTIVITAMINS THER. W/MINERALS) TABS Take 1 tablet by mouth daily.     .  nitrofurantoin, macrocrystal-monohydrate, (MACROBID) 100 MG capsule Take 1 capsule (100 mg total) by mouth 2 (two) times daily. 14 capsule 0  . ondansetron (ZOFRAN) 4 MG tablet Take 1 tablet (4 mg total) by mouth every 8 (eight) hours as needed for nausea or vomiting. 20 tablet 0  . pantoprazole (PROTONIX) 40 MG tablet Take 1 tablet (40 mg total) by mouth daily. 30 tablet 3  . potassium chloride SA (K-DUR) 20 MEQ tablet TAKE 1 TABLET EVERY DAY 90 tablet 0  . pregabalin (LYRICA) 300 MG capsule TAKE 1 CAPSULE(300 MG) BY MOUTH TWICE DAILY 60 capsule 5  . traMADol (ULTRAM) 50 MG tablet TAKE 1 TABLET(50 MG) BY  MOUTH FOUR TIMES DAILY 120 tablet 5  . traZODone (DESYREL) 50 MG tablet Take 1 tablet (50 mg total) by mouth at bedtime as needed for sleep. 30 tablet 3  . Vitamin D, Ergocalciferol, (DRISDOL) 1.25 MG (50000 UNIT) CAPS capsule Take 1 capsule (50,000 Units total) by mouth every 7 (seven) days. 12 capsule 0   No current facility-administered medications for this visit.    Allergies:   Carafate [sucralfate] and Sulfonamide derivatives    Social History:  The patient  reports that she quit smoking about 10 years ago. Her smoking use included cigarettes. She has a 5.00 pack-year smoking history. She has never used smokeless tobacco. She reports current alcohol use. She reports that she does not use drugs.   Family History:  The patient's family history includes Alzheimer's disease in her father and paternal grandmother; Atrial fibrillation in her mother; Diabetes in her maternal grandmother and mother; Healthy in her sister; Heart disease in her father and maternal grandmother; Hypertension in her father, maternal grandfather, mother, and paternal grandmother; Kidney disease in her brother; Prostate cancer in her father; Stroke (age of onset: 81) in her maternal grandfather.    ROS: All other systems are reviewed and negative. Unless otherwise mentioned in H&P    PHYSICAL EXAM: VS:  There were no  vitals taken for this visit. , BMI There is no height or weight on file to calculate BMI. GEN: Well nourished, well developed, in no acute distress HEENT: normal Neck: no JVD, carotid bruits, or masses Cardiac: ***RRR; no murmurs, rubs, or gallops,no edema  Respiratory:  Clear to auscultation bilaterally, normal work of breathing GI: soft, nontender, nondistended, + BS MS: no deformity or atrophy Skin: warm and dry, no rash Neuro:  Strength and sensation are intact Psych: euthymic mood, full affect   EKG:  EKG {ACTION; IS/IS ONG:[id number]} ordered today. The ekg ordered today demonstrates ***   Recent Labs: 01/02/2019: Pro B Natriuretic peptide (BNP) 176.0; TSH 0.98 09/14/2019: ALT 58; BUN 19; Creat 0.94; Hemoglobin 11.4; Platelets 227; Potassium 4.4; Sodium 142    Lipid Panel    Component Value Date/Time   CHOL 146 01/02/2019 1403   TRIG 66.0 01/02/2019 1403   TRIG 70 02/18/2006 0933   HDL 73.40 01/02/2019 1403   CHOLHDL 2 01/02/2019 1403   VLDL 13.2 01/02/2019 1403   LDLCALC 60 01/02/2019 1403      Wt Readings from Last 3 Encounters:  09/26/19 267 lb (121.1 kg)  09/05/19 268 lb 3.2 oz (121.7 kg)  08/09/19 268 lb (121.6 kg)      Other studies Reviewed:  Cardiac CTA 09/21/2019  Aorta:  Normal size.  No calcifications.  No dissection.  Aortic Valve:  Trileaflet.  No calcifications.  Coronary Arteries:  Normal coronary origin.  Left dominance.  RCA is a small non-dominant artery.  Left main is a large artery that gives rise to LAD and LCX arteries. Left main has no plaque.  LAD is a large vessel that gives rise to one large diagonal artery and wraps around the apex. Proximal LAD has mild diffuse calcified plaque with stenosis 0-25%. Mid and distal LAD has only luminal irregularities.  D1 is a large artery with minimal plaque.  LCX is a large lumen dominant artery that gives rise to two large OM branches, PDA and PLA. There is minimal plaque.  OM1,  OM2 are large branches with minimal irregularities.  PDA, PLA have minimal plaque.  Other findings:  Normal pulmonary vein drainage into the  left atrium.  Normal left atrial appendage without a thrombus.  Normal size of the pulmonary artery.  IMPRESSION: 1. Coronary calcium score of 117. This was 72 percentile for age and sex matched control.  2. Normal coronary origin with left dominance.  Echocardiogram 03/28/2019 1. Left ventricular ejection fraction, by visual estimation, is 60 to  65%. The left ventricle has normal function. There is mildly increased  left ventricular hypertrophy.  2. The left ventricle has no regional wall motion abnormalities.  3. Global right ventricle has normal systolic function.The right  ventricular size is normal. No increase in right ventricular wall  thickness.  4. Left atrial size was normal.  5. Right atrial size was mildly dilated.  6. The mitral valve is normal in structure. Trivial mitral valve  regurgitation. No evidence of mitral stenosis.  7. The tricuspid valve is normal in structure. Tricuspid valve  regurgitation is mild.  8. The aortic valve is tricuspid. Aortic valve regurgitation is not  visualized. No evidence of aortic valve sclerosis or stenosis.  9. Pulmonic regurgitation is mild.  10. The pulmonic valve was normal in structure. Pulmonic valve  regurgitation is mild.   ASSESSMENT AND PLAN:  1.  ***   Current medicines are reviewed at length with the patient today.  I have spent *** dedicated to the care of this patient on the date of this encounter to include pre-visit review of records, assessment, management and diagnostic testing,with shared decision making.  Labs/ tests ordered today include: *** Phill Myron. West Pugh, ANP, [facility]   10/08/2019 8:36 AM    [address] Office [phone] Fax [phone]  Notice: This dictation was prepared with  Dragon dictation along with smaller phrase technology. Any transcriptional errors that result from this process are unintentional and may not be corrected upon review.

## 2019-10-09 ENCOUNTER — Encounter: Payer: Self-pay | Admitting: Internal Medicine

## 2019-10-09 ENCOUNTER — Telehealth (INDEPENDENT_AMBULATORY_CARE_PROVIDER_SITE_OTHER): Payer: Medicare HMO | Admitting: Internal Medicine

## 2019-10-09 ENCOUNTER — Ambulatory Visit: Payer: Medicare HMO | Admitting: Adult Health

## 2019-10-09 DIAGNOSIS — R198 Other specified symptoms and signs involving the digestive system and abdomen: Secondary | ICD-10-CM | POA: Diagnosis not present

## 2019-10-09 DIAGNOSIS — R112 Nausea with vomiting, unspecified: Secondary | ICD-10-CM

## 2019-10-09 MED ORDER — METOCLOPRAMIDE HCL 5 MG PO TABS: 5.0000 mg | ORAL_TABLET | Freq: Three times a day (TID) | ORAL | 3 refills | Status: DC

## 2019-10-09 NOTE — Assessment & Plan Note (Signed)
Rx for reglan for GI complaints which she has taken for same in the past 2018. She would like to see GI at [facility] which is where did her gastric bypass. Checking labs today and CT abdomen and pelvis to rule out structural problems.

## 2019-10-09 NOTE — Progress Notes (Signed)
Virtual Visit via Video Note  I connected with [name] on 10/09/19 at  9:40 AM EDT by a video enabled telemedicine application and verified that I am speaking with the correct person using two identifiers.  The patient and the provider were at separate locations throughout the entire encounter. Patient location: home, Provider location: work   I discussed the limitations of evaluation and management by telemedicine and the availability of in person appointments. The patient expressed understanding and agreed to proceed. The patient and the provider were the only parties present for the visit unless noted in HPI below.  History of Present Illness: The patient is a 64 y.o. female with visit for stomach issues. Started last Wednesday. Has nausea and vomiting after this sensation. Denies fevers or chills. Is eating light foods like ginger ale which has seemed to help a little. She is also having diarrhea some mostly watery without blood. Overall it is improving but she is concerned due to this being the second or third time she has had flares of this in the last several months. Has tried taking protonix daily and has not stopped. She is using zofran for nausea which is helping slightly. She is not sure if this has to do with past gastric bypass or food allergy. She denies new foods. At one time thought it may be gluten and stopped gluten but is not sure about that. Wants to see a GI doctor since this is coming back now several times.   Observations/Objective: Appearance: appears tired, breathing appears normal, casual grooming, abdomen does not appear distended, throat not visualized, memory normal, mental status is A and O times 3  Assessment and Plan: See problem oriented charting  Follow Up Instructions: Checking labs and CT abdomen and pelvis, referral to GI done  I discussed the assessment and treatment plan with the patient. The patient was provided an opportunity to ask questions and  all were answered. The patient agreed with the plan and demonstrated an understanding of the instructions.   The patient was advised to call back or seek an in-person evaluation if the symptoms worsen or if the condition fails to improve as anticipated.  Hoyt Koch, MD

## 2019-10-11 ENCOUNTER — Other Ambulatory Visit (INDEPENDENT_AMBULATORY_CARE_PROVIDER_SITE_OTHER): Payer: Medicare HMO

## 2019-10-11 DIAGNOSIS — R112 Nausea with vomiting, unspecified: Secondary | ICD-10-CM | POA: Diagnosis not present

## 2019-10-11 LAB — COMPREHENSIVE METABOLIC PANEL
ALT: 26 U/L (ref 0–35)
AST: 24 U/L (ref 0–37)
Albumin: 3.7 g/dL (ref 3.5–5.2)
Alkaline Phosphatase: 176 U/L — ABNORMAL HIGH (ref 39–117)
BUN: 18 mg/dL (ref 6–23)
CO2: 23 mEq/L (ref 19–32)
Calcium: 9 mg/dL (ref 8.4–10.5)
Chloride: 114 mEq/L — ABNORMAL HIGH (ref 96–112)
Creatinine, Ser: 0.85 mg/dL (ref 0.40–1.20)
GFR: 80.97 mL/min (ref >60.00)
Glucose, Bld: 96 mg/dL (ref 70–99)
Potassium: 3.7 mEq/L (ref 3.5–5.1)
Sodium: 141 mEq/L (ref 135–145)
Total Bilirubin: 0.3 mg/dL (ref 0.2–1.2)
Total Protein: 6.4 g/dL (ref 6.0–8.3)

## 2019-10-11 LAB — CBC
HCT: 37.5 % (ref 36.0–46.0)
Hemoglobin: 12.1 g/dL (ref 12.0–15.0)
MCHC: 32.3 g/dL (ref 30.0–36.0)
MCV: 86.6 fl (ref 78.0–100.0)
Platelets: 191 10*3/uL (ref 150.0–400.0)
RBC: 4.32 Mil/uL (ref 3.87–5.11)
RDW: 16.8 % — ABNORMAL HIGH (ref 11.5–15.5)
WBC: 6.3 10*3/uL (ref 4.0–10.5)

## 2019-10-11 LAB — LIPASE: Lipase: 26 U/L (ref 11.0–59.0)

## 2019-10-11 LAB — VITAMIN B12: Vitamin B-12: >1526 pg/mL — ABNORMAL HIGH (ref 211–911)

## 2019-10-14 NOTE — Progress Notes (Signed)
Virtual Visit via Video Note   This visit type was conducted due to national recommendations for restrictions regarding the COVID-19 Pandemic (e.g. social distancing) in an effort to limit this patient's exposure and mitigate transmission in our community.  Due to her co-morbid illnesses, this patient is at least at moderate risk for complications without adequate follow up.  This format is felt to be most appropriate for this patient at this time.  All issues noted in this document were discussed and addressed.  A limited physical exam was performed with this format.  Please refer to the patient's chart for her consent to telehealth for [facility].   Date:  10/15/2019   ID:  [name], DOB 1955/09/27, MRN [id number]  Patient Location: Home Provider Location: Office  PCP:  Hoyt Koch, MD  Cardiologist:  Dr. Ellyn Hack Electrophysiologist:  None   Evaluation Performed:  Follow-Up Visit  Chief Complaint:  Follow Up  History of Present Illness:    [name] is a 64 y.o. female we are following for on going assessment and management of bilateral DVTs status post IVC filter 2009 no longer on anticoagulation therapy, hypertension, complaints of extreme fatigue, history of OSA on CPAP.  She is not able to walk around without getting chest tightness and shortness of breath when last seen by Dr. Ellyn Hack on 08/08/2019.  On last office visit she was recommended for nutritional counseling.  She has a history of gastric bypass but has gained a considerable amount of weight back.  Her deconditioning was likely as result of her morbid obesity.  Blood pressure was well controlled on amlodipine.  Cardiac CT was ordered to evaluate for cardiac etiology of her symptoms.  This was completed on 09/21/2019.  Revealing a coronary calcium score of 117, 83 percentile for age and sex matched control.  She had minimal nonobstructive CAD (0 to 24%).  This was reassuring.  She continues to  wait on her appointment for sleep study.  Has not yet heard from our office concerning appointment time.  She denies any chest pain.  She continues to have dyspnea on exertion especially when she tries to take a walk outside.  She has some significant mobility issues and has to use a cane for ambulation.  She occasionally has some heartburn pain, GERD pain when she is out walking.  She has not had any dizziness or extreme fatigue.  The patient does not have symptoms concerning for COVID-19 infection (fever, chills, cough, or new shortness of breath).    Past Medical History:  Diagnosis Date   Anemia    takes Ferrous Sulfate daily   Anxiety    takes Citalopram daily   Arthritis    Asthma    2004-prior to gastric bypass and no problems since   CHF (congestive heart failure) ([place])    takes Lasix daily as needed   Chronic back pain    spondylolisthesis/stenosis/radiculopathy   Complication of anesthesia yrs ago   slow to wake up   Depression    Diabetes ([place])    DVT (deep venous thrombosis) ([place]) 01/17/2013   past hx. -tx.5-6 yrs ago bilateral legs, occ. sporadic swelling, has IVC filter implanted   Dysrhythmia    "heart tends to flutter"   Fibromyalgia    Fracture    right foot and is in a cam boot   Gallstones    GERD (gastroesophageal reflux disease)    hx of-no meds now   Heart murmur  History of bronchitis 2012 or 2013   History of colon polyps    Hypertension    takes Metoprolol daily   Insomnia    takes Melatonin daily   Pelvic floor dysfunction    Peripheral neuropathy    Pneumonia 90's   hx of   S/P gastric bypass 2003   Sleep apnea    no cpap used in many yrs after weight lost-no machine now   Urinary frequency    Urinary urgency    Vitamin D deficiency    Past Surgical History:  Procedure Laterality Date   BALLOON DILATION N/A 01/31/2013   Procedure: BALLOON DILATION;  Surgeon: Arta Silence, MD;  Location: [facility];   Service: Endoscopy;  Laterality: N/A;   CARDIAC EVENT MONITOR  03/2019   Predominantly sinus rhythm.  Rates range from 48-124 bpm.  Average 74 bpm.  Frequent short bursts (3-15 beats) PAT/PSVT--not indicated as being symptomatic on diary.  Otherwise rare PACs and PVCs.   CHOLECYSTECTOMY  2007   COLONOSCOPY     DILATION AND CURETTAGE OF UTERUS  yrs ago   ESOPHAGOGASTRODUODENOSCOPY (EGD) WITH PROPOFOL  03/01/2012   Procedure: ESOPHAGOGASTRODUODENOSCOPY (EGD) WITH PROPOFOL;  Surgeon: Arta Silence, MD;  [facility];  Service: Endoscopy;  Laterality: N/A;   ESOPHAGOGASTRODUODENOSCOPY (EGD) WITH PROPOFOL N/A 01/31/2013   Procedure: ESOPHAGOGASTRODUODENOSCOPY (EGD) WITH PROPOFOL;  Surgeon: Arta Silence, MD;  [facility];  Service: Endoscopy;  Laterality: N/A;   ESOPHAGOGASTRODUODENOSCOPY (EGD) WITH PROPOFOL N/A 09/02/2015   Procedure: ESOPHAGOGASTRODUODENOSCOPY (EGD) WITH PROPOFOL;  Surgeon: Gatha Mayer, MD;  [facility];  Service: Endoscopy;  Laterality: N/A;   GASTRIC BY-PASS  2004   HERNIA REPAIR  2005   INSERTION OF VENA CAVA FILTER  01-17-13   inserted 2004- "abdomen"   LIGAMENT REPAIR Right 1987   Rt. knee scope   MAXIMUM ACCESS (MAS)POSTERIOR LUMBAR INTERBODY FUSION (PLIF) 2 LEVEL N/A 06/07/2014   Procedure: L4-5 L5-S1 FOR MAXIMUM ACCESS (MAS) POSTERIOR LUMBAR INTERBODY FUSION ;  Surgeon: Erline Levine, MD;  Location: [facility];  Service: Neurosurgery;  Laterality: N/A;  L4-5 L5-S1 FOR MAXIMUM ACCESS (MAS) POSTERIOR LUMBAR INTERBODY FUSION    TONSILLECTOMY     as child   TRANSTHORACIC ECHOCARDIOGRAM  03/2019   EF 60 to 65%.  Mild LVH.  No R WMA.  Normal RV size.  Normal LA, mild RA dilation.  No aortic valve stenosis or sclerosis.     Current Meds  Medication Sig   ACCU-CHEK SOFTCLIX LANCETS lancets CHECK BLOOD SUGARS TWICE DAILY   albuterol (VENTOLIN HFA) 108 (90 Base) MCG/ACT inhaler INHALE 2 PUFFS INTO THE LUNGS EVERY 6 HOURS AS  NEEDED FOR WHEEZING OR SHORTNESS OF BREATH   Alcohol Swabs (B-D SINGLE USE SWABS REGULAR) PADS USE TO WIPE AREA TO CHECK BLOOD SUGAR DAILY    amLODipine (NORVASC) 10 MG tablet TAKE 1 TABLET EVERY DAY   BLACK COHOSH PO Take 1 tablet by mouth daily.   Blood Glucose Calibration (ACCU-CHEK AVIVA) SOLN Use as directed   Blood Glucose Monitoring Suppl (ACCU-CHEK AVIVA PLUS) w/Device KIT USE AS DIRECTED  TO CHECK BLOOD SUGAR EVERY DAY   DULoxetine (CYMBALTA) 30 MG capsule TAKE 1 TO 2 CAPSULES EVERY DAY   furosemide (LASIX) 40 MG tablet TAKE 1 TABLET DAILY AS NEEDED FOR EDEMA   glucose blood (ACCU-CHEK AVIVA PLUS) test strip 1 each by Other route 2 (two) times daily. Use to check blood sugars twice a day   metoCLOPramide (REGLAN) 5 MG tablet  Take 1 tablet (5 mg total) by mouth 3 (three) times daily before meals.   Multiple Vitamins-Minerals (MULTIVITAMINS THER. W/MINERALS) TABS Take 1 tablet by mouth daily.    nitrofurantoin (MACRODANTIN) 100 MG capsule    ondansetron (ZOFRAN) 4 MG tablet Take 1 tablet (4 mg total) by mouth every 8 (eight) hours as needed for nausea or vomiting.   pantoprazole (PROTONIX) 40 MG tablet Take 1 tablet (40 mg total) by mouth daily.   potassium chloride SA (K-DUR) 20 MEQ tablet TAKE 1 TABLET EVERY DAY   pregabalin (LYRICA) 300 MG capsule TAKE 1 CAPSULE(300 MG) BY MOUTH TWICE DAILY   traMADol (ULTRAM) 50 MG tablet TAKE 1 TABLET(50 MG) BY MOUTH FOUR TIMES DAILY   traZODone (DESYREL) 50 MG tablet Take 1 tablet (50 mg total) by mouth at bedtime as needed for sleep.   Vitamin D, Ergocalciferol, (DRISDOL) 1.25 MG (50000 UNIT) CAPS capsule Take 1 capsule (50,000 Units total) by mouth every 7 (seven) days.     Allergies:   Carafate [sucralfate] and Sulfonamide derivatives   Social History   Tobacco Use   Smoking status: Former Smoker    Packs/day: 0.50    Years: 10.00    Pack years: 5.00    Types: Cigarettes    Quit date: 04/19/2009    Years since  quitting: 10.4   Smokeless tobacco: Never Used  Substance Use Topics   Alcohol use: Yes    Alcohol/week: 0.0 standard drinks    Comment: occ. social- wine-1 drink monthly   Drug use: No     Family Hx: The patient's family history includes Alzheimer's disease in her father and paternal grandmother; Atrial fibrillation in her mother; Diabetes in her maternal grandmother and mother; Healthy in her sister; Heart disease in her father and maternal grandmother; Hypertension in her father, maternal grandfather, mother, and paternal grandmother; Kidney disease in her brother; Prostate cancer in her father; Stroke (age of onset: 61) in her maternal grandfather. There is no history of Colon cancer, Colon polyps, Stomach cancer, Esophageal cancer, Pancreatic cancer, or Liver disease.  ROS:   Please see the history of present illness.   All other systems reviewed and are negative.   Prior CV studies:   The following studies were reviewed today:  Coronary CTA 09/21/2019 PRESSION: 1. Coronary calcium score of 0. This was 0 percentile for age and sex matched control.  2. Normal coronary origin with right dominance.  3. CAD-RADS 1. Minimal non-obstructive CAD (0-24%). Consider non-atherosclerotic causes of chest pain. Consider preventive therapy and risk factor modification.  Echocardiogram 03/28/2019  1. Left ventricular ejection fraction, by visual estimation, is 60 to  65%. The left ventricle has normal function. There is mildly increased  left ventricular hypertrophy.  2. The left ventricle has no regional wall motion abnormalities.  3. Global right ventricle has normal systolic function.The right  ventricular size is normal. No increase in right ventricular wall  thickness.  4. Left atrial size was normal.  5. Right atrial size was mildly dilated.  6. The mitral valve is normal in structure. Trivial mitral valve  regurgitation. No evidence of mitral stenosis.  7. The  tricuspid valve is normal in structure. Tricuspid valve  regurgitation is mild.  8. The aortic valve is tricuspid. Aortic valve regurgitation is not  visualized. No evidence of aortic valve sclerosis or stenosis.  9. Pulmonic regurgitation is mild.  10. The pulmonic valve was normal in structure. Pulmonic valve  regurgitation is mild.   Labs/Other Tests  and Data Reviewed:    EKG:  No ECG reviewed.  Recent Labs: 01/02/2019: Pro B Natriuretic peptide (BNP) 176.0; TSH 0.98 10/11/2019: ALT 26; BUN 18; Creatinine, Ser 0.85; Hemoglobin 12.1; Platelets 191.0; Potassium 3.7; Sodium 141   Recent Lipid Panel Lab Results  Component Value Date/Time   CHOL 146 01/02/2019 02:03 PM   TRIG 66.0 01/02/2019 02:03 PM   TRIG 70 02/18/2006 09:33 AM   HDL 73.40 01/02/2019 02:03 PM   CHOLHDL 2 01/02/2019 02:03 PM   LDLCALC 60 01/02/2019 02:03 PM    Wt Readings from Last 3 Encounters:  10/15/19 265 lb (120.2 kg)  09/26/19 267 lb (121.1 kg)  09/05/19 268 lb 3.2 oz (121.7 kg)     Objective:    Vital Signs:  Ht '5\' 6"'$  (1.676 m)    Wt 265 lb (120.2 kg)    BMI 42.77 kg/m    VITAL SIGNS:  reviewed GEN:  no acute distress EYES:  sclerae anicteric, EOMI - Extraocular Movements Intact RESPIRATORY:  normal respiratory effort, symmetric expansion MUSCULOSKELETAL:  no obvious deformities. NEURO:  alert and oriented x 3, no obvious focal deficit PSYCH:    ASSESSMENT & PLAN:    1. OSA: The patient is to be scheduled for a sleep study.  She has recently received prior authorization we are waiting to schedule her test.  This may be playing into her dyspnea on exertion.  Will await test results concerning further recommendations.  Echocardiogram was essentially normal.  2.  Hypertension: Currently controlled.  No changes in her medication regimen.  3.  History of DVT: Status post IVC filter 2009.  No longer on anticoagulation.  4.  Hyperlipidemia: Currently very well controlled.  Continue current  medication regimen.  COVID-19 Education: The signs and symptoms of COVID-19 were discussed with the patient and how to seek care for testing (follow up with PCP or arrange E-visit). The importance of social distancing was discussed today.  Time:   Today, I have spent 20  minutes with the patient with telehealth technology discussing the above problems.     Medication Adjustments/Labs and Tests Ordered: Current medicines are reviewed at length with the patient today.  Concerns regarding medicines are outlined above.   Tests Ordered: No orders of the defined types were placed in this encounter.   Medication Changes: No orders of the defined types were placed in this encounter.   Disposition:  Follow up 6 months   Signed, this is a complicated lady she did move to significant wheelchair got dementia and her daughter is trying to keep up with all of it and she is saying that her mother is complaining of chest pain she probably try to get everything done because he could not remember her medicines the last time so she tries very hard this is the last visit with me so I know that she is probably working already and therefore I get that lady taking care of.  There was a whole lot help work with the PA-C does give her an enema she will be fine mother was nursing bedside nursing help her okay Phill Myron. [place] Pugh, ANP, AACC  10/15/2019 4:05 PM    [facility]

## 2019-10-15 ENCOUNTER — Encounter: Payer: Self-pay | Admitting: Adult Health

## 2019-10-15 ENCOUNTER — Encounter: Payer: Self-pay | Admitting: Internal Medicine

## 2019-10-15 ENCOUNTER — Telehealth (INDEPENDENT_AMBULATORY_CARE_PROVIDER_SITE_OTHER): Payer: Medicare HMO | Admitting: Adult Health

## 2019-10-15 VITALS — Ht 66.0 in | Wt 265.0 lb

## 2019-10-15 DIAGNOSIS — R0600 Dyspnea, unspecified: Secondary | ICD-10-CM

## 2019-10-15 DIAGNOSIS — G4733 Obstructive sleep apnea (adult) (pediatric): Secondary | ICD-10-CM

## 2019-10-15 DIAGNOSIS — I10 Essential (primary) hypertension: Secondary | ICD-10-CM | POA: Diagnosis not present

## 2019-10-15 DIAGNOSIS — E7800 Pure hypercholesterolemia, unspecified: Secondary | ICD-10-CM | POA: Diagnosis not present

## 2019-10-15 DIAGNOSIS — R0609 Other forms of dyspnea: Secondary | ICD-10-CM

## 2019-10-15 NOTE — Patient Instructions (Signed)

## 2019-10-16 ENCOUNTER — Ambulatory Visit (INDEPENDENT_AMBULATORY_CARE_PROVIDER_SITE_OTHER): Payer: Medicare HMO | Admitting: Family Medicine

## 2019-10-16 DIAGNOSIS — R112 Nausea with vomiting, unspecified: Secondary | ICD-10-CM | POA: Diagnosis not present

## 2019-10-16 DIAGNOSIS — R197 Diarrhea, unspecified: Secondary | ICD-10-CM | POA: Diagnosis not present

## 2019-10-16 DIAGNOSIS — R1310 Dysphagia, unspecified: Secondary | ICD-10-CM | POA: Diagnosis not present

## 2019-10-16 DIAGNOSIS — R1030 Lower abdominal pain, unspecified: Secondary | ICD-10-CM | POA: Diagnosis not present

## 2019-10-16 DIAGNOSIS — R1011 Right upper quadrant pain: Secondary | ICD-10-CM | POA: Diagnosis not present

## 2019-10-17 DIAGNOSIS — R197 Diarrhea, unspecified: Secondary | ICD-10-CM | POA: Diagnosis not present

## 2019-10-18 ENCOUNTER — Ambulatory Visit: Payer: Medicare HMO | Admitting: Skilled Nursing Facility1

## 2019-10-25 DIAGNOSIS — R2681 Unsteadiness on feet: Secondary | ICD-10-CM | POA: Diagnosis not present

## 2019-10-25 DIAGNOSIS — M6281 Muscle weakness (generalized): Secondary | ICD-10-CM | POA: Diagnosis not present

## 2019-10-25 DIAGNOSIS — R262 Difficulty in walking, not elsewhere classified: Secondary | ICD-10-CM | POA: Diagnosis not present

## 2019-10-25 DIAGNOSIS — M545 Low back pain: Secondary | ICD-10-CM | POA: Diagnosis not present

## 2019-10-30 ENCOUNTER — Ambulatory Visit
Admission: RE | Admit: 2019-10-30 | Discharge: 2019-10-30 | Disposition: A | Discharge status: Home / Self Care | Payer: Medicare HMO | Source: Ambulatory Visit | Attending: Internal Medicine | Admitting: Internal Medicine

## 2019-10-30 DIAGNOSIS — K6389 Other specified diseases of intestine: Secondary | ICD-10-CM | POA: Diagnosis not present

## 2019-10-30 DIAGNOSIS — N3289 Other specified disorders of bladder: Secondary | ICD-10-CM | POA: Diagnosis not present

## 2019-10-30 DIAGNOSIS — K561 Intussusception: Secondary | ICD-10-CM | POA: Diagnosis not present

## 2019-10-30 DIAGNOSIS — R112 Nausea with vomiting, unspecified: Secondary | ICD-10-CM

## 2019-10-30 DIAGNOSIS — M419 Scoliosis, unspecified: Secondary | ICD-10-CM | POA: Diagnosis not present

## 2019-10-30 MED ORDER — IOPAMIDOL (ISOVUE-300) INJECTION 61%
100.0000 mL | Freq: Once | INTRAVENOUS | Status: AC | PRN
  Administered 2019-10-30: 125 mL via INTRAVENOUS

## 2019-10-31 DIAGNOSIS — R262 Difficulty in walking, not elsewhere classified: Secondary | ICD-10-CM | POA: Diagnosis not present

## 2019-10-31 DIAGNOSIS — M545 Low back pain: Secondary | ICD-10-CM | POA: Diagnosis not present

## 2019-10-31 DIAGNOSIS — M6281 Muscle weakness (generalized): Secondary | ICD-10-CM | POA: Diagnosis not present

## 2019-10-31 DIAGNOSIS — R2681 Unsteadiness on feet: Secondary | ICD-10-CM | POA: Diagnosis not present

## 2019-11-01 ENCOUNTER — Telehealth: Payer: Self-pay | Admitting: Internal Medicine

## 2019-11-01 NOTE — Telephone Encounter (Signed)
Please return call to discuss CT scan results

## 2019-11-05 ENCOUNTER — Other Ambulatory Visit: Payer: Self-pay | Admitting: Internal Medicine

## 2019-11-05 ENCOUNTER — Other Ambulatory Visit: Payer: Self-pay | Admitting: Family

## 2019-11-05 MED ORDER — TRAZODONE HCL 50 MG PO TABS: 50.0000 mg | ORAL_TABLET | Freq: Every evening | ORAL | 0 refills | Status: DC | PRN

## 2019-11-06 ENCOUNTER — Other Ambulatory Visit: Payer: Self-pay | Admitting: *Deleted

## 2019-11-06 DIAGNOSIS — R262 Difficulty in walking, not elsewhere classified: Secondary | ICD-10-CM | POA: Diagnosis not present

## 2019-11-06 DIAGNOSIS — R2681 Unsteadiness on feet: Secondary | ICD-10-CM | POA: Diagnosis not present

## 2019-11-06 DIAGNOSIS — M6281 Muscle weakness (generalized): Secondary | ICD-10-CM | POA: Diagnosis not present

## 2019-11-06 DIAGNOSIS — M545 Low back pain: Secondary | ICD-10-CM | POA: Diagnosis not present

## 2019-11-06 MED ORDER — ALBUTEROL SULFATE HFA 108 (90 BASE) MCG/ACT IN AERS: INHALATION_SPRAY | RESPIRATORY_TRACT | 0 refills | Status: DC

## 2019-11-06 MED ORDER — PANTOPRAZOLE SODIUM 40 MG PO TBEC: 40.0000 mg | DELAYED_RELEASE_TABLET | Freq: Every day | ORAL | 3 refills | Status: DC

## 2019-11-07 ENCOUNTER — Other Ambulatory Visit: Payer: Self-pay | Admitting: Internal Medicine

## 2019-11-08 DIAGNOSIS — R2681 Unsteadiness on feet: Secondary | ICD-10-CM | POA: Diagnosis not present

## 2019-11-08 DIAGNOSIS — R262 Difficulty in walking, not elsewhere classified: Secondary | ICD-10-CM | POA: Diagnosis not present

## 2019-11-08 DIAGNOSIS — M6281 Muscle weakness (generalized): Secondary | ICD-10-CM | POA: Diagnosis not present

## 2019-11-08 DIAGNOSIS — M545 Low back pain: Secondary | ICD-10-CM | POA: Diagnosis not present

## 2019-11-13 DIAGNOSIS — M6281 Muscle weakness (generalized): Secondary | ICD-10-CM | POA: Diagnosis not present

## 2019-11-13 DIAGNOSIS — Z9884 Bariatric surgery status: Secondary | ICD-10-CM | POA: Diagnosis not present

## 2019-11-13 DIAGNOSIS — R2681 Unsteadiness on feet: Secondary | ICD-10-CM | POA: Diagnosis not present

## 2019-11-13 DIAGNOSIS — R7401 Elevation of levels of liver transaminase levels: Secondary | ICD-10-CM | POA: Diagnosis not present

## 2019-11-13 DIAGNOSIS — A0472 Enterocolitis due to Clostridium difficile, not specified as recurrent: Secondary | ICD-10-CM | POA: Diagnosis not present

## 2019-11-13 DIAGNOSIS — R262 Difficulty in walking, not elsewhere classified: Secondary | ICD-10-CM | POA: Diagnosis not present

## 2019-11-13 DIAGNOSIS — R1084 Generalized abdominal pain: Secondary | ICD-10-CM | POA: Diagnosis not present

## 2019-11-13 DIAGNOSIS — M545 Low back pain: Secondary | ICD-10-CM | POA: Diagnosis not present

## 2019-11-15 ENCOUNTER — Telehealth: Payer: Self-pay | Admitting: Internal Medicine

## 2019-11-15 DIAGNOSIS — A0472 Enterocolitis due to Clostridium difficile, not specified as recurrent: Secondary | ICD-10-CM | POA: Diagnosis not present

## 2019-11-15 MED ORDER — ACCU-CHEK AVIVA PLUS VI STRP: 1.0000 | ORAL_STRIP | Freq: Two times a day (BID) | 2 refills | Status: DC

## 2019-11-15 NOTE — Telephone Encounter (Signed)
     [facility] Mail delivery requesting order for True Metrix lancets and test strips

## 2019-11-19 DIAGNOSIS — R2681 Unsteadiness on feet: Secondary | ICD-10-CM | POA: Diagnosis not present

## 2019-11-19 DIAGNOSIS — R262 Difficulty in walking, not elsewhere classified: Secondary | ICD-10-CM | POA: Diagnosis not present

## 2019-11-19 DIAGNOSIS — M6281 Muscle weakness (generalized): Secondary | ICD-10-CM | POA: Diagnosis not present

## 2019-11-19 DIAGNOSIS — M545 Low back pain: Secondary | ICD-10-CM | POA: Diagnosis not present

## 2019-11-21 DIAGNOSIS — M545 Low back pain: Secondary | ICD-10-CM | POA: Diagnosis not present

## 2019-11-21 DIAGNOSIS — M6281 Muscle weakness (generalized): Secondary | ICD-10-CM | POA: Diagnosis not present

## 2019-11-21 DIAGNOSIS — R2681 Unsteadiness on feet: Secondary | ICD-10-CM | POA: Diagnosis not present

## 2019-11-21 DIAGNOSIS — R262 Difficulty in walking, not elsewhere classified: Secondary | ICD-10-CM | POA: Diagnosis not present

## 2019-11-26 ENCOUNTER — Ambulatory Visit: Payer: Medicare HMO | Admitting: Family Medicine

## 2019-11-26 NOTE — Progress Notes (Deleted)
[place] [facility] [employer] Phone: [phone] Subjective:    I'm seeing this patient by the request  of:  Hoyt Koch, MD  CC:   [id number]   09/26/2019 Chronic problem with exacerbation.  Patient is making some mild improvement with the physical therapy which is very helpful for her but unfortunately patient continues to have the radicular symptoms and needed the cane for ambulation.  Patient's quality of life is still decreased.  Patient did not have any significant improvement with the last epidural.  I do feel that advanced imaging could be warranted secondary to past surgical history and need to evaluate for potential adjacent segment disease.  Discussed medication management including being sparingly with patient's tramadol as well as patient's Lyrica and Cymbalta.  Depending on imaging this may change which injections we do otherwise.  Encourage weight loss which patient does seem motivated at the moment.  Follow-up again in 6 to 8 weeks  Update 11/26/2019 [name] is a 64 y.o. female coming in with complaint of low back pain. Patient states       Past Medical History:  Diagnosis Date  . Anemia    takes Ferrous Sulfate daily  . Anxiety    takes Citalopram daily  . Arthritis   . Asthma    2004-prior to gastric bypass and no problems since  . CHF (congestive heart failure) ([facility])    takes Lasix daily as needed  . Chronic back pain    spondylolisthesis/stenosis/radiculopathy  . Complication of anesthesia yrs ago   slow to wake up  . Depression   . Diabetes ([place])   . DVT (deep venous thrombosis) ([place]) 01/17/2013   past hx. -tx.5-6 yrs ago bilateral legs, occ. sporadic swelling, has IVC filter implanted  . Dysrhythmia    "heart tends to flutter"  . Fibromyalgia   . Fracture    right foot and is in a cam boot  . Gallstones   . GERD (gastroesophageal reflux disease)    hx of-no meds now  . Heart  murmur   . History of bronchitis 2012 or 2013  . History of colon polyps   . Hypertension    takes Metoprolol daily  . Insomnia    takes Melatonin daily  . Pelvic floor dysfunction   . Peripheral neuropathy   . Pneumonia 90's   hx of  . S/P gastric bypass 2003  . Sleep apnea    no cpap used in many yrs after weight lost-no machine now  . Urinary frequency   . Urinary urgency   . Vitamin D deficiency    Past Surgical History:  Procedure Laterality Date  . BALLOON DILATION N/A 01/31/2013   Procedure: BALLOON DILATION;  Surgeon: Arta Silence, MD;  [facility];  Service: Endoscopy;  Laterality: N/A;  . CARDIAC EVENT MONITOR  03/2019   Predominantly sinus rhythm.  Rates range from 48-124 bpm.  Average 74 bpm.  Frequent short bursts (3-15 beats) PAT/PSVT--not indicated as being symptomatic on diary.  Otherwise rare PACs and PVCs.  . CHOLECYSTECTOMY  2007  . COLONOSCOPY    . DILATION AND CURETTAGE OF UTERUS  yrs ago  . ESOPHAGOGASTRODUODENOSCOPY (EGD) WITH PROPOFOL  03/01/2012   Procedure: ESOPHAGOGASTRODUODENOSCOPY (EGD) WITH PROPOFOL;  Surgeon: Arta Silence, MD;  [facility];  Service: Endoscopy;  Laterality: N/A;  . ESOPHAGOGASTRODUODENOSCOPY (EGD) WITH PROPOFOL N/A 01/31/2013   Procedure: ESOPHAGOGASTRODUODENOSCOPY (EGD) WITH PROPOFOL;  Surgeon: Arta Silence, MD;  [facility];  Service: Endoscopy;  Laterality: N/A;  . ESOPHAGOGASTRODUODENOSCOPY (EGD) WITH PROPOFOL N/A 09/02/2015   Procedure: ESOPHAGOGASTRODUODENOSCOPY (EGD) WITH PROPOFOL;  Surgeon: Gatha Mayer, MD;  [facility];  Service: Endoscopy;  Laterality: N/A;  . GASTRIC BY-PASS  2004  . HERNIA REPAIR  2005  . INSERTION OF VENA CAVA FILTER  01-17-13   inserted 2004- "abdomen"  . LIGAMENT REPAIR Right 1987   Rt. knee scope  . MAXIMUM ACCESS (MAS)POSTERIOR LUMBAR INTERBODY FUSION (PLIF) 2 LEVEL N/A 06/07/2014   Procedure: L4-5 L5-S1 FOR MAXIMUM ACCESS (MAS) POSTERIOR LUMBAR  INTERBODY FUSION ;  Surgeon: Erline Levine, MD;  Location: [place] [facility];  Service: Neurosurgery;  Laterality: N/A;  L4-5 L5-S1 FOR MAXIMUM ACCESS (MAS) POSTERIOR LUMBAR INTERBODY FUSION   . TONSILLECTOMY     as child  . TRANSTHORACIC ECHOCARDIOGRAM  03/2019   EF 60 to 65%.  Mild LVH.  No R WMA.  Normal RV size.  Normal LA, mild RA dilation.  No aortic valve stenosis or sclerosis.   Social History   Socioeconomic History  . Marital status: Widowed    Spouse name: Not on file  . Number of children: 0  . Years of education: college  . Highest education level: Not on file  Occupational History  . Occupation: retired  Tobacco Use  . Smoking status: Former Smoker    Packs/day: 0.50    Years: 10.00    Pack years: 5.00    Types: Cigarettes    Quit date: 04/19/2009    Years since quitting: 10.6  . Smokeless tobacco: Never Used  Substance and Sexual Activity  . Alcohol use: Yes    Alcohol/week: 0.0 standard drinks    Comment: occ. social- wine-1 drink monthly  . Drug use: No  . Sexual activity: Not Currently  Other Topics Concern  . Not on file  Social History Narrative   Widow.  No kids.  Lives with her mother who is 50.  She does walk with a cane.  She is a retired Investment banker, corporate   She quit smoking in 2003   Social Determinants of Health   Financial Resource Strain:   . Difficulty of Paying Living Expenses:   Food Insecurity:   . Worried About Charity fundraiser in the Last Year:   . Arboriculturist in the Last Year:   Transportation Needs:   . Film/video editor (Medical):   Marland Kitchen Lack of Transportation (Non-Medical):   Physical Activity:   . Days of Exercise per Week:   . Minutes of Exercise per Session:   Stress:   . Feeling of Stress :   Social Connections:   . Frequency of Communication with Friends and Family:   . Frequency of Social Gatherings with Friends and Family:   . Attends Religious Services:   . Active Member of Clubs or Organizations:   . Attends  Archivist Meetings:   Marland Kitchen Marital Status:    Allergies  Allergen Reactions  . Carafate [Sucralfate] Hives, Itching and Other (See Comments)    Angioedema     . Sulfonamide Derivatives Rash    Syncope   Family History  Problem Relation Age of Onset  . Diabetes Mother   . Atrial fibrillation Mother   . Hypertension Mother   . Heart disease Father        Unsure of details  . Hypertension Father   . Prostate cancer Father   . Alzheimer's disease Father   .  Kidney disease Brother   . Healthy Sister   . Diabetes Maternal Grandmother   . Heart disease Maternal Grandmother   . Stroke Maternal Grandfather 50  . Hypertension Maternal Grandfather   . Hypertension Paternal Grandmother   . Alzheimer's disease Paternal Grandmother   . Colon cancer Neg Hx   . Colon polyps Neg Hx   . Stomach cancer Neg Hx   . Esophageal cancer Neg Hx   . Pancreatic cancer Neg Hx   . Liver disease Neg Hx      Current Outpatient Medications (Cardiovascular):  .  amLODipine (NORVASC) 10 MG tablet, Take 1 tablet (10 mg total) by mouth daily. Annual appt due in Sept must see provider for future refills .  furosemide (LASIX) 40 MG tablet, Take 1 tablet (40 mg total) by mouth daily as needed. Annual appt due in Sept must see provider for future refills .  metoprolol tartrate (LOPRESSOR) 50 MG tablet, Take 2 tablets (100 mg total) by mouth once for 1 dose. TAKE ONE HOUR PRIOR TO  SCHEDULE CARDIAC TEST  Current Outpatient Medications (Respiratory):  .  albuterol (VENTOLIN HFA) 108 (90 Base) MCG/ACT inhaler, INHALE 2 PUFFS INTO THE LUNGS EVERY 6 HOURS AS NEEDED FOR WHEEZING OR SHORTNESS OF BREATH  Current Outpatient Medications (Analgesics):  .  traMADol (ULTRAM) 50 MG tablet, TAKE 1 TABLET(50 MG) BY MOUTH FOUR TIMES DAILY   Current Outpatient Medications (Other):  Marland Kitchen  ACCU-CHEK SOFTCLIX LANCETS lancets, CHECK BLOOD SUGARS TWICE DAILY .  Alcohol Swabs (B-D SINGLE USE SWABS REGULAR) PADS, USE TO  WIPE AREA TO CHECK BLOOD SUGAR DAILY .  BLACK COHOSH PO, Take 1 tablet by mouth daily. .  Blood Glucose Calibration (ACCU-CHEK AVIVA) SOLN, Use as directed .  Blood Glucose Monitoring Suppl (ACCU-CHEK AVIVA PLUS) w/Device KIT, USE AS DIRECTED  TO CHECK BLOOD SUGAR EVERY DAY .  DULoxetine (CYMBALTA) 30 MG capsule, TAKE 1 TO 2 CAPSULES EVERY DAY .  gabapentin (NEURONTIN) 400 MG capsule, Take 1 capsule (400 mg total) by mouth 4 (four) times daily. Annual appt due in Sept must see provider for future refills .  glucose blood (ACCU-CHEK AVIVA PLUS) test strip, 1 each by Other route 2 (two) times daily. Use to check blood sugars twice a day .  metoCLOPramide (REGLAN) 5 MG tablet, Take 1 tablet (5 mg total) by mouth 3 (three) times daily before meals. .  Multiple Vitamins-Minerals (MULTIVITAMINS THER. W/MINERALS) TABS, Take 1 tablet by mouth daily.  .  nitrofurantoin (MACRODANTIN) 100 MG capsule,  .  ondansetron (ZOFRAN) 4 MG tablet, Take 1 tablet (4 mg total) by mouth every 8 (eight) hours as needed for nausea or vomiting. .  pantoprazole (PROTONIX) 40 MG tablet, TAKE 1 TABLET(40 MG) BY MOUTH DAILY .  potassium chloride SA (KLOR-CON) 20 MEQ tablet, TAKE 1 TABLET EVERY DAY .  pregabalin (LYRICA) 300 MG capsule, TAKE 1 CAPSULE(300 MG) BY MOUTH TWICE DAILY .  traZODone (DESYREL) 50 MG tablet, TAKE 1 TABLET(50 MG) BY MOUTH AT BEDTIME AS NEEDED FOR SLEEP .  Vitamin D, Ergocalciferol, (DRISDOL) 1.25 MG (50000 UNIT) CAPS capsule, Take 1 capsule (50,000 Units total) by mouth every 7 (seven) days.   Reviewed prior external information including notes and imaging from  primary care provider As well as notes that were available from care everywhere and other healthcare systems.  Past medical history, social, surgical and family history all reviewed in electronic medical record.  No pertanent information unless stated regarding to the chief complaint.  Review of Systems:  No headache, visual changes, nausea,  vomiting, diarrhea, constipation, dizziness, abdominal pain, skin rash, fevers, chills, night sweats, weight loss, swollen lymph nodes, body aches, joint swelling, chest pain, shortness of breath, mood changes. POSITIVE muscle aches  Objective  There were no vitals taken for this visit.   General: No apparent distress alert and oriented x3 mood and affect normal, dressed appropriately.  HEENT: Pupils equal, extraocular movements intact  Respiratory: Patient's speak in full sentences and does not appear short of breath  Cardiovascular: No lower extremity edema, non tender, no erythema  Neuro: Cranial nerves II through XII are intact, neurovascularly intact in all extremities with 2+ DTRs and 2+ pulses.  Gait normal with good balance and coordination.  MSK:  Non tender with full range of motion and good stability and symmetric strength and tone of shoulders, elbows, wrist, hip, knee and ankles bilaterally.     Impression and Recommendations:     The above documentation has been reviewed and is accurate and complete Jacqualin Combes       Note: This dictation was prepared with Dragon dictation along with smaller phrase technology. Any transcriptional errors that result from this process are unintentional.

## 2019-11-29 ENCOUNTER — Ambulatory Visit: Payer: Medicare HMO | Admitting: Family Medicine

## 2019-11-29 DIAGNOSIS — Z20822 Contact with and (suspected) exposure to covid-19: Secondary | ICD-10-CM | POA: Diagnosis not present

## 2019-11-29 DIAGNOSIS — Z03818 Encounter for observation for suspected exposure to other biological agents ruled out: Secondary | ICD-10-CM | POA: Diagnosis not present

## 2019-11-29 NOTE — Progress Notes (Deleted)
[place] Phone: [phone] Subjective:    I'm seeing this patient by the request  of:  Hoyt Koch, MD  CC:   [id number]   6/9/20201 Chronic problem with exacerbation.  Patient is making some mild improvement with the physical therapy which is very helpful for her but unfortunately patient continues to have the radicular symptoms and needed the cane for ambulation.  Patient's quality of life is still decreased.  Patient did not have any significant improvement with the last epidural.  I do feel that advanced imaging could be warranted secondary to past surgical history and need to evaluate for potential adjacent segment disease.  Discussed medication management including being sparingly with patient's tramadol as well as patient's Lyrica and Cymbalta.  Depending on imaging this may change which injections we do otherwise.  Encourage weight loss which patient does seem motivated at the moment.  Follow-up again in 6 to 8 weeks  Update 11/29/2019 JINNY SWEETLAND is a 64 y.o. female coming in with complaint of low back pain. Last epidural 07/26/2019. Patient states     Past Medical History:  Diagnosis Date  . Anemia    takes Ferrous Sulfate daily  . Anxiety    takes Citalopram daily  . Arthritis   . Asthma    2004-prior to gastric bypass and no problems since  . CHF (congestive heart failure) (HCC)    takes Lasix daily as needed  . Chronic back pain    spondylolisthesis/stenosis/radiculopathy  . Complication of anesthesia yrs ago   slow to wake up  . Depression   . Diabetes ([place])   . DVT (deep venous thrombosis) ([place]) 01/17/2013   past hx. -tx.5-6 yrs ago bilateral legs, occ. sporadic swelling, has IVC filter implanted  . Dysrhythmia    "heart tends to flutter"  . Fibromyalgia   . Fracture    right foot and is in a cam boot  . Gallstones   . GERD (gastroesophageal reflux disease)    hx  of-no meds now  . Heart murmur   . History of bronchitis 2012 or 2013  . History of colon polyps   . Hypertension    takes Metoprolol daily  . Insomnia    takes Melatonin daily  . Pelvic floor dysfunction   . Peripheral neuropathy   . Pneumonia 90's   hx of  . S/P gastric bypass 2003  . Sleep apnea    no cpap used in many yrs after weight lost-no machine now  . Urinary frequency   . Urinary urgency   . Vitamin D deficiency    Past Surgical History:  Procedure Laterality Date  . BALLOON DILATION N/A 01/31/2013   Procedure: BALLOON DILATION;  Surgeon: Arta Silence, MD;  Location: [facility];  Service: Endoscopy;  Laterality: N/A;  . CARDIAC EVENT MONITOR  03/2019   Predominantly sinus rhythm.  Rates range from 48-124 bpm.  Average 74 bpm.  Frequent short bursts (3-15 beats) PAT/PSVT--not indicated as being symptomatic on diary.  Otherwise rare PACs and PVCs.  . CHOLECYSTECTOMY  2007  . COLONOSCOPY    . DILATION AND CURETTAGE OF UTERUS  yrs ago  . ESOPHAGOGASTRODUODENOSCOPY (EGD) WITH PROPOFOL  03/01/2012   Procedure: ESOPHAGOGASTRODUODENOSCOPY (EGD) WITH PROPOFOL;  Surgeon: Arta Silence, MD;  Location: [facility];  Service: Endoscopy;  Laterality: N/A;  . ESOPHAGOGASTRODUODENOSCOPY (EGD) WITH PROPOFOL N/A 01/31/2013   Procedure: ESOPHAGOGASTRODUODENOSCOPY (EGD) WITH PROPOFOL;  Surgeon: Arta Silence, MD;  Location: [facility];  Service: Endoscopy;  Laterality: N/A;  . ESOPHAGOGASTRODUODENOSCOPY (EGD) WITH PROPOFOL N/A 09/02/2015   Procedure: ESOPHAGOGASTRODUODENOSCOPY (EGD) WITH PROPOFOL;  Surgeon: Gatha Mayer, MD;  Location: [facility];  Service: Endoscopy;  Laterality: N/A;  . GASTRIC BY-PASS  2004  . HERNIA REPAIR  2005  . INSERTION OF VENA CAVA FILTER  01-17-13   inserted 2004- "abdomen"  . LIGAMENT REPAIR Right 1987   Rt. knee scope  . MAXIMUM ACCESS (MAS)POSTERIOR LUMBAR INTERBODY FUSION (PLIF) 2 LEVEL N/A 06/07/2014   Procedure: L4-5 L5-S1 FOR MAXIMUM ACCESS  (MAS) POSTERIOR LUMBAR INTERBODY FUSION ;  Surgeon: Erline Levine, MD;  Location: [facility];  Service: Neurosurgery;  Laterality: N/A;  L4-5 L5-S1 FOR MAXIMUM ACCESS (MAS) POSTERIOR LUMBAR INTERBODY FUSION   . TONSILLECTOMY     as child  . TRANSTHORACIC ECHOCARDIOGRAM  03/2019   EF 60 to 65%.  Mild LVH.  No R WMA.  Normal RV size.  Normal LA, mild RA dilation.  No aortic valve stenosis or sclerosis.   Social History   Socioeconomic History  . Marital status: Widowed    Spouse name: Not on file  . Number of children: 0  . Years of education: college  . Highest education level: Not on file  Occupational History  . Occupation: retired  Tobacco Use  . Smoking status: Former Smoker    Packs/day: 0.50    Years: 10.00    Pack years: 5.00    Types: Cigarettes    Quit date: 04/19/2009    Years since quitting: 10.6  . Smokeless tobacco: Never Used  Substance and Sexual Activity  . Alcohol use: Yes    Alcohol/week: 0.0 standard drinks    Comment: occ. social- wine-1 drink monthly  . Drug use: No  . Sexual activity: Not Currently  Other Topics Concern  . Not on file  Social History Narrative   Widow.  No kids.  Lives with her mother who is 59.  She does walk with a cane.  She is a retired Investment banker, corporate   She quit smoking in 2003   Social Determinants of Health   Financial Resource Strain:   . Difficulty of Paying Living Expenses:   Food Insecurity:   . Worried About Charity fundraiser in the Last Year:   . Arboriculturist in the Last Year:   Transportation Needs:   . Film/video editor (Medical):   Marland Kitchen Lack of Transportation (Non-Medical):   Physical Activity:   . Days of Exercise per Week:   . Minutes of Exercise per Session:   Stress:   . Feeling of Stress :   Social Connections:   . Frequency of Communication with Friends and Family:   . Frequency of Social Gatherings with Friends and Family:   . Attends Religious Services:   . Active Member of Clubs or  Organizations:   . Attends Archivist Meetings:   Marland Kitchen Marital Status:    Allergies  Allergen Reactions  . Carafate [Sucralfate] Hives, Itching and Other (See Comments)    Angioedema     . Sulfonamide Derivatives Rash    Syncope   Family History  Problem Relation Age of Onset  . Diabetes Mother   . Atrial fibrillation Mother   . Hypertension Mother   . Heart disease Father        Unsure of details  . Hypertension Father   . Prostate cancer Father   . Alzheimer's disease Father   .  Kidney disease Brother   . Healthy Sister   . Diabetes Maternal Grandmother   . Heart disease Maternal Grandmother   . Stroke Maternal Grandfather 50  . Hypertension Maternal Grandfather   . Hypertension Paternal Grandmother   . Alzheimer's disease Paternal Grandmother   . Colon cancer Neg Hx   . Colon polyps Neg Hx   . Stomach cancer Neg Hx   . Esophageal cancer Neg Hx   . Pancreatic cancer Neg Hx   . Liver disease Neg Hx      Current Outpatient Medications (Cardiovascular):  .  amLODipine (NORVASC) 10 MG tablet, Take 1 tablet (10 mg total) by mouth daily. Annual appt due in Sept must see provider for future refills .  furosemide (LASIX) 40 MG tablet, Take 1 tablet (40 mg total) by mouth daily as needed. Annual appt due in Sept must see provider for future refills .  metoprolol tartrate (LOPRESSOR) 50 MG tablet, Take 2 tablets (100 mg total) by mouth once for 1 dose. TAKE ONE HOUR PRIOR TO  SCHEDULE CARDIAC TEST  Current Outpatient Medications (Respiratory):  .  albuterol (VENTOLIN HFA) 108 (90 Base) MCG/ACT inhaler, INHALE 2 PUFFS INTO THE LUNGS EVERY 6 HOURS AS NEEDED FOR WHEEZING OR SHORTNESS OF BREATH  Current Outpatient Medications (Analgesics):  .  traMADol (ULTRAM) 50 MG tablet, TAKE 1 TABLET(50 MG) BY MOUTH FOUR TIMES DAILY   Current Outpatient Medications (Other):  Marland Kitchen  ACCU-CHEK SOFTCLIX LANCETS lancets, CHECK BLOOD SUGARS TWICE DAILY .  Alcohol Swabs (B-D SINGLE USE  SWABS REGULAR) PADS, USE TO WIPE AREA TO CHECK BLOOD SUGAR DAILY .  BLACK COHOSH PO, Take 1 tablet by mouth daily. .  Blood Glucose Calibration (ACCU-CHEK AVIVA) SOLN, Use as directed .  Blood Glucose Monitoring Suppl (ACCU-CHEK AVIVA PLUS) w/Device KIT, USE AS DIRECTED  TO CHECK BLOOD SUGAR EVERY DAY .  DULoxetine (CYMBALTA) 30 MG capsule, TAKE 1 TO 2 CAPSULES EVERY DAY .  gabapentin (NEURONTIN) 400 MG capsule, Take 1 capsule (400 mg total) by mouth 4 (four) times daily. Annual appt due in Sept must see provider for future refills .  glucose blood (ACCU-CHEK AVIVA PLUS) test strip, 1 each by Other route 2 (two) times daily. Use to check blood sugars twice a day .  metoCLOPramide (REGLAN) 5 MG tablet, Take 1 tablet (5 mg total) by mouth 3 (three) times daily before meals. .  Multiple Vitamins-Minerals (MULTIVITAMINS THER. W/MINERALS) TABS, Take 1 tablet by mouth daily.  .  nitrofurantoin (MACRODANTIN) 100 MG capsule,  .  ondansetron (ZOFRAN) 4 MG tablet, Take 1 tablet (4 mg total) by mouth every 8 (eight) hours as needed for nausea or vomiting. .  pantoprazole (PROTONIX) 40 MG tablet, TAKE 1 TABLET(40 MG) BY MOUTH DAILY .  potassium chloride SA (KLOR-CON) 20 MEQ tablet, TAKE 1 TABLET EVERY DAY .  pregabalin (LYRICA) 300 MG capsule, TAKE 1 CAPSULE(300 MG) BY MOUTH TWICE DAILY .  traZODone (DESYREL) 50 MG tablet, TAKE 1 TABLET(50 MG) BY MOUTH AT BEDTIME AS NEEDED FOR SLEEP .  Vitamin D, Ergocalciferol, (DRISDOL) 1.25 MG (50000 UNIT) CAPS capsule, Take 1 capsule (50,000 Units total) by mouth every 7 (seven) days.   Reviewed prior external information including notes and imaging from  primary care provider As well as notes that were available from care everywhere and other healthcare systems.  Past medical history, social, surgical and family history all reviewed in electronic medical record.  No pertanent information unless stated regarding to the chief complaint.  Review of Systems:  No  headache, visual changes, nausea, vomiting, diarrhea, constipation, dizziness, abdominal pain, skin rash, fevers, chills, night sweats, weight loss, swollen lymph nodes, body aches, joint swelling, chest pain, shortness of breath, mood changes. POSITIVE muscle aches  Objective  There were no vitals taken for this visit.   General: No apparent distress alert and oriented x3 mood and affect normal, dressed appropriately.  HEENT: Pupils equal, extraocular movements intact  Respiratory: Patient's speak in full sentences and does not appear short of breath  Cardiovascular: No lower extremity edema, non tender, no erythema  Neuro: Cranial nerves II through XII are intact, neurovascularly intact in all extremities with 2+ DTRs and 2+ pulses.  Gait normal with good balance and coordination.  MSK:  Non tender with full range of motion and good stability and symmetric strength and tone of shoulders, elbows, wrist, hip, knee and ankles bilaterally.     Impression and Recommendations:     The above documentation has been reviewed and is accurate and complete Jacqualin Combes       Note: This dictation was prepared with Dragon dictation along with smaller phrase technology. Any transcriptional errors that result from this process are unintentional.

## 2019-12-07 DIAGNOSIS — Z20828 Contact with and (suspected) exposure to other viral communicable diseases: Secondary | ICD-10-CM | POA: Diagnosis not present

## 2019-12-11 DIAGNOSIS — M545 Low back pain: Secondary | ICD-10-CM | POA: Diagnosis not present

## 2019-12-11 DIAGNOSIS — M6281 Muscle weakness (generalized): Secondary | ICD-10-CM | POA: Diagnosis not present

## 2019-12-11 DIAGNOSIS — R2681 Unsteadiness on feet: Secondary | ICD-10-CM | POA: Diagnosis not present

## 2019-12-11 DIAGNOSIS — R262 Difficulty in walking, not elsewhere classified: Secondary | ICD-10-CM | POA: Diagnosis not present

## 2019-12-12 ENCOUNTER — Other Ambulatory Visit: Payer: Self-pay | Admitting: Cardiology

## 2019-12-13 DIAGNOSIS — M545 Low back pain: Secondary | ICD-10-CM | POA: Diagnosis not present

## 2019-12-13 DIAGNOSIS — R2681 Unsteadiness on feet: Secondary | ICD-10-CM | POA: Diagnosis not present

## 2019-12-13 DIAGNOSIS — M6281 Muscle weakness (generalized): Secondary | ICD-10-CM | POA: Diagnosis not present

## 2019-12-13 DIAGNOSIS — R262 Difficulty in walking, not elsewhere classified: Secondary | ICD-10-CM | POA: Diagnosis not present

## 2019-12-18 DIAGNOSIS — R2681 Unsteadiness on feet: Secondary | ICD-10-CM | POA: Diagnosis not present

## 2019-12-18 DIAGNOSIS — M545 Low back pain: Secondary | ICD-10-CM | POA: Diagnosis not present

## 2019-12-18 DIAGNOSIS — M6281 Muscle weakness (generalized): Secondary | ICD-10-CM | POA: Diagnosis not present

## 2019-12-18 DIAGNOSIS — R262 Difficulty in walking, not elsewhere classified: Secondary | ICD-10-CM | POA: Diagnosis not present

## 2019-12-25 ENCOUNTER — Encounter: Payer: Self-pay | Admitting: Internal Medicine

## 2019-12-25 ENCOUNTER — Other Ambulatory Visit: Payer: Self-pay | Admitting: Internal Medicine

## 2019-12-25 DIAGNOSIS — M797 Fibromyalgia: Secondary | ICD-10-CM

## 2019-12-25 MED ORDER — TRAMADOL HCL 50 MG PO TABS: 50.0000 mg | ORAL_TABLET | Freq: Four times a day (QID) | ORAL | 1 refills | Status: DC | PRN

## 2020-01-02 DIAGNOSIS — M545 Low back pain: Secondary | ICD-10-CM | POA: Diagnosis not present

## 2020-01-02 DIAGNOSIS — M6281 Muscle weakness (generalized): Secondary | ICD-10-CM | POA: Diagnosis not present

## 2020-01-02 DIAGNOSIS — R262 Difficulty in walking, not elsewhere classified: Secondary | ICD-10-CM | POA: Diagnosis not present

## 2020-01-02 DIAGNOSIS — R2681 Unsteadiness on feet: Secondary | ICD-10-CM | POA: Diagnosis not present

## 2020-01-07 DIAGNOSIS — R262 Difficulty in walking, not elsewhere classified: Secondary | ICD-10-CM | POA: Diagnosis not present

## 2020-01-07 DIAGNOSIS — M545 Low back pain: Secondary | ICD-10-CM | POA: Diagnosis not present

## 2020-01-07 DIAGNOSIS — R2681 Unsteadiness on feet: Secondary | ICD-10-CM | POA: Diagnosis not present

## 2020-01-07 DIAGNOSIS — M6281 Muscle weakness (generalized): Secondary | ICD-10-CM | POA: Diagnosis not present

## 2020-01-15 DIAGNOSIS — M545 Low back pain: Secondary | ICD-10-CM | POA: Diagnosis not present

## 2020-01-15 DIAGNOSIS — M6281 Muscle weakness (generalized): Secondary | ICD-10-CM | POA: Diagnosis not present

## 2020-01-15 DIAGNOSIS — R262 Difficulty in walking, not elsewhere classified: Secondary | ICD-10-CM | POA: Diagnosis not present

## 2020-01-15 DIAGNOSIS — R2681 Unsteadiness on feet: Secondary | ICD-10-CM | POA: Diagnosis not present

## 2020-01-24 DIAGNOSIS — R2681 Unsteadiness on feet: Secondary | ICD-10-CM | POA: Diagnosis not present

## 2020-01-24 DIAGNOSIS — R262 Difficulty in walking, not elsewhere classified: Secondary | ICD-10-CM | POA: Diagnosis not present

## 2020-01-24 DIAGNOSIS — M545 Low back pain, unspecified: Secondary | ICD-10-CM | POA: Diagnosis not present

## 2020-01-24 DIAGNOSIS — M6281 Muscle weakness (generalized): Secondary | ICD-10-CM | POA: Diagnosis not present

## 2020-01-28 ENCOUNTER — Telehealth: Payer: Self-pay | Admitting: Internal Medicine

## 2020-01-28 DIAGNOSIS — I10 Essential (primary) hypertension: Secondary | ICD-10-CM

## 2020-01-28 NOTE — Progress Notes (Signed)
  Chronic Care Management   Note  01/28/2020 Name: [name] MRN: [id number] DOB: June 27, 1955  [name] is a 64 y.o. year old female who is a primary care patient of Hoyt Koch, MD. I reached out to [name] by phone today in response to a referral sent by [name]'s PCP, Hoyt Koch, MD.   [name] was given information about Chronic Care Management services today including:  1. CCM service includes personalized support from designated clinical staff supervised by her physician, including individualized plan of care and coordination with other care providers 2. 24/7 contact phone numbers for assistance for urgent and routine care needs. 3. Service will only be billed when office clinical staff spend 20 minutes or more in a month to coordinate care. 4. Only one practitioner may furnish and bill the service in a calendar month. 5. The patient may stop CCM services at any time (effective at the end of the month) by phone call to the office staff.   Patient agreed to services and verbal consent obtained.   Follow up plan:   Carley Perdue [employer] Scheduler

## 2020-01-30 ENCOUNTER — Other Ambulatory Visit: Payer: Self-pay | Admitting: Family

## 2020-01-30 ENCOUNTER — Other Ambulatory Visit: Payer: Self-pay | Admitting: Internal Medicine

## 2020-01-31 DIAGNOSIS — M545 Low back pain, unspecified: Secondary | ICD-10-CM | POA: Diagnosis not present

## 2020-01-31 DIAGNOSIS — M6281 Muscle weakness (generalized): Secondary | ICD-10-CM | POA: Diagnosis not present

## 2020-01-31 DIAGNOSIS — R2681 Unsteadiness on feet: Secondary | ICD-10-CM | POA: Diagnosis not present

## 2020-01-31 DIAGNOSIS — R262 Difficulty in walking, not elsewhere classified: Secondary | ICD-10-CM | POA: Diagnosis not present

## 2020-02-01 ENCOUNTER — Other Ambulatory Visit: Payer: Self-pay | Admitting: Internal Medicine

## 2020-02-05 NOTE — Progress Notes (Signed)
[place] Phone: [phone] Subjective:   [name], am serving as a scribe for Dr. Hulan Saas. This visit occurred during the SARS-CoV-2 public health emergency.  Safety protocols were in place, including screening questions prior to the visit, additional usage of staff PPE, and extensive cleaning of exam room while observing appropriate contact time as indicated for disinfecting solutions.   I'm seeing this patient by the request  of:  Hoyt Koch, MD  CC: Right knee and low back pain  VCB:[id number]   09/26/2019 Chronic problem with exacerbation.  Patient is making some mild improvement with the physical therapy which is very helpful for her but unfortunately patient continues to have the radicular symptoms and needed the cane for ambulation.  Patient's quality of life is still decreased.  Patient did not have any significant improvement with the last epidural.  I do feel that advanced imaging could be warranted secondary to past surgical history and need to evaluate for potential adjacent segment disease.  Discussed medication management including being sparingly with patient's tramadol as well as patient's Lyrica and Cymbalta.  Depending on imaging this may change which injections we do otherwise.  Encourage weight loss which patient does seem motivated at the moment.  Follow-up again in 6 to 8 weeks  Update 02/05/2020 [name] is a 64 y.o. female coming in with complaint of lumbar radiculopathy. Pain is increasing in lower back and radiating down the right leg. Patient also having right knee pain and calf pain. Using Tramadol but this is not alleviating her pain. Also using Lyrica which is not helping. Still doing physical therapy. Patient uses rollator at home and cane outside of house to prevent falling. Injection in 2018. Epidural April 2021. Patient states that the knee pain is causing  some more increase in instability.  He denies any pain in the posterior aspect.  With history of DVT and has been off Xarelto for over a year.  Denies that this feels something like that at all.  States that it hurts in the knee and more on the medial joint line     Past Medical History:  Diagnosis Date  . Anemia    takes Ferrous Sulfate daily  . Anxiety    takes Citalopram daily  . Arthritis   . Asthma    2004-prior to gastric bypass and [name] problems since  . CHF (congestive heart failure) (HCC)    takes Lasix daily as needed  . Chronic back pain    spondylolisthesis/stenosis/radiculopathy  . Complication of anesthesia yrs ago   slow to wake up  . Depression   . Diabetes ([place])   . DVT (deep venous thrombosis) ([place]) 01/17/2013   past hx. -tx.5-6 yrs ago bilateral legs, occ. sporadic swelling, has IVC filter implanted  . Dysrhythmia    "heart tends to flutter"  . Fibromyalgia   . Fracture    right foot and is in a cam boot  . Gallstones   . GERD (gastroesophageal reflux disease)    hx of-[name] meds now  . Heart murmur   . History of bronchitis 2012 or 2013  . History of colon polyps   . Hypertension    takes Metoprolol daily  . Insomnia    takes Melatonin daily  . Pelvic floor dysfunction   . Peripheral neuropathy   . Pneumonia 90's   hx of  . S/P gastric bypass 2003  . Sleep apnea  [name] cpap used in many yrs after weight lost-[name] machine now  . Urinary frequency   . Urinary urgency   . Vitamin D deficiency    Past Surgical History:  Procedure Laterality Date  . BALLOON DILATION N/A 01/31/2013   Procedure: BALLOON DILATION;  Surgeon: Arta Silence, MD;  Location: [facility];  Service: Endoscopy;  Laterality: N/A;  . CARDIAC EVENT MONITOR  03/2019   Predominantly sinus rhythm.  Rates range from 48-124 bpm.  Average 74 bpm.  Frequent short bursts (3-15 beats) PAT/PSVT--not indicated as being symptomatic on diary.  Otherwise rare PACs and PVCs.  . CHOLECYSTECTOMY   2007  . COLONOSCOPY    . DILATION AND CURETTAGE OF UTERUS  yrs ago  . ESOPHAGOGASTRODUODENOSCOPY (EGD) WITH PROPOFOL  03/01/2012   Procedure: ESOPHAGOGASTRODUODENOSCOPY (EGD) WITH PROPOFOL;  Surgeon: Arta Silence, MD;  Location: [facility];  Service: Endoscopy;  Laterality: N/A;  . ESOPHAGOGASTRODUODENOSCOPY (EGD) WITH PROPOFOL N/A 01/31/2013   Procedure: ESOPHAGOGASTRODUODENOSCOPY (EGD) WITH PROPOFOL;  Surgeon: Arta Silence, MD;  Location: [facility];  Service: Endoscopy;  Laterality: N/A;  . ESOPHAGOGASTRODUODENOSCOPY (EGD) WITH PROPOFOL N/A 09/02/2015   Procedure: ESOPHAGOGASTRODUODENOSCOPY (EGD) WITH PROPOFOL;  Surgeon: Gatha Mayer, MD;  Location: [facility];  Service: Endoscopy;  Laterality: N/A;  . GASTRIC BY-PASS  2004  . HERNIA REPAIR  2005  . INSERTION OF VENA CAVA FILTER  01-17-13   inserted 2004- "abdomen"  . LIGAMENT REPAIR Right 1987   Rt. knee scope  . MAXIMUM ACCESS (MAS)POSTERIOR LUMBAR INTERBODY FUSION (PLIF) 2 LEVEL N/A 06/07/2014   Procedure: L4-5 L5-S1 FOR MAXIMUM ACCESS (MAS) POSTERIOR LUMBAR INTERBODY FUSION ;  Surgeon: Erline Levine, MD;  Location: [facility];  Service: Neurosurgery;  Laterality: N/A;  L4-5 L5-S1 FOR MAXIMUM ACCESS (MAS) POSTERIOR LUMBAR INTERBODY FUSION   . TONSILLECTOMY     as child  . TRANSTHORACIC ECHOCARDIOGRAM  03/2019   EF 60 to 65%.  Mild LVH.  [name] R WMA.  Normal RV size.  Normal LA, mild RA dilation.  [name] aortic valve stenosis or sclerosis.   Social History   Socioeconomic History  . Marital status: Widowed    Spouse name: Not on file  . Number of children: 0  . Years of education: college  . Highest education level: Not on file  Occupational History  . Occupation: retired  Tobacco Use  . Smoking status: Former Smoker    Packs/day: 0.50    Years: 10.00    Pack years: 5.00    Types: Cigarettes    Quit date: 04/19/2009    Years since quitting: 10.8  . Smokeless tobacco: Never Used  Substance and Sexual Activity  . Alcohol  use: Yes    Alcohol/week: 0.0 standard drinks    Comment: occ. social- wine-1 drink monthly  . Drug use: [name]  . Sexual activity: Not Currently  Other Topics Concern  . Not on file  Social History Narrative   Widow.  [name] kids.  Lives with her mother who is 63.  She does walk with a cane.  She is a retired Investment banker, corporate   She quit smoking in 2003   Social Determinants of Health   Financial Resource Strain:   . Difficulty of Paying Living Expenses: Not on file  Food Insecurity:   . Worried About Charity fundraiser in the Last Year: Not on file  . Ran Out of Food in the Last Year: Not on file  Transportation Needs:   . Lack of Transportation (Medical): Not  on file  . Lack of Transportation (Non-Medical): Not on file  Physical Activity:   . Days of Exercise per Week: Not on file  . Minutes of Exercise per Session: Not on file  Stress:   . Feeling of Stress : Not on file  Social Connections:   . Frequency of Communication with Friends and Family: Not on file  . Frequency of Social Gatherings with Friends and Family: Not on file  . Attends Religious Services: Not on file  . Active Member of Clubs or Organizations: Not on file  . Attends Archivist Meetings: Not on file  . Marital Status: Not on file   Allergies  Allergen Reactions  . Carafate [Sucralfate] Hives, Itching and Other (See Comments)    Angioedema     . Sulfonamide Derivatives Rash    Syncope   Family History  Problem Relation Age of Onset  . Diabetes Mother   . Atrial fibrillation Mother   . Hypertension Mother   . Heart disease Father        Unsure of details  . Hypertension Father   . Prostate cancer Father   . Alzheimer's disease Father   . Kidney disease Brother   . Healthy Sister   . Diabetes Maternal Grandmother   . Heart disease Maternal Grandmother   . Stroke Maternal Grandfather 50  . Hypertension Maternal Grandfather   . Hypertension Paternal Grandmother   . Alzheimer's  disease Paternal Grandmother   . Colon cancer Neg Hx   . Colon polyps Neg Hx   . Stomach cancer Neg Hx   . Esophageal cancer Neg Hx   . Pancreatic cancer Neg Hx   . Liver disease Neg Hx      Current Outpatient Medications (Cardiovascular):  .  amLODipine (NORVASC) 10 MG tablet, Take 1 tablet (10 mg total) by mouth daily. Annual appt due in Sept must see provider for future refills .  furosemide (LASIX) 40 MG tablet, TAKE 1 TABLET DAILY AS NEEDED. ANNUAL APPOINTMENT DUE IN SEPT MUST SEE PROVIDER FOR FUTURE REFILLS. Marland Kitchen  metoprolol tartrate (LOPRESSOR) 50 MG tablet, Take 2 tablets (100 mg total) by mouth once for 1 dose. TAKE ONE HOUR PRIOR TO  SCHEDULE CARDIAC TEST  Current Outpatient Medications (Respiratory):  .  albuterol (VENTOLIN HFA) 108 (90 Base) MCG/ACT inhaler, INHALE 2 PUFFS INTO THE LUNGS EVERY 6 HOURS AS NEEDED FOR WHEEZING OR SHORTNESS OF BREATH  Current Outpatient Medications (Analgesics):  .  traMADol (ULTRAM) 50 MG tablet, Take 1 tablet (50 mg total) by mouth every 6 (six) hours as needed.   Current Outpatient Medications (Other):  Marland Kitchen  ACCU-CHEK SOFTCLIX LANCETS lancets, CHECK BLOOD SUGARS TWICE DAILY .  Alcohol Swabs (B-D SINGLE USE SWABS REGULAR) PADS, USE AS DIRECTED EVERY DAY .  BLACK COHOSH PO, Take 1 tablet by mouth daily. .  Blood Glucose Calibration (ACCU-CHEK AVIVA) SOLN, Use as directed .  Blood Glucose Monitoring Suppl (ACCU-CHEK AVIVA PLUS) w/Device KIT, USE AS DIRECTED  TO CHECK BLOOD SUGAR EVERY DAY .  DULoxetine (CYMBALTA) 30 MG capsule, TAKE 1 TO 2 CAPSULES EVERY DAY .  glucose blood (ACCU-CHEK AVIVA PLUS) test strip, 1 each by Other route 2 (two) times daily. Use to check blood sugars twice a day .  metoCLOPramide (REGLAN) 5 MG tablet, Take 1 tablet (5 mg total) by mouth 3 (three) times daily before meals. .  Multiple Vitamins-Minerals (MULTIVITAMINS THER. W/MINERALS) TABS, Take 1 tablet by mouth daily.  .  nitrofurantoin (MACRODANTIN) 100  MG capsule,  .   ondansetron (ZOFRAN) 4 MG tablet, Take 1 tablet (4 mg total) by mouth every 8 (eight) hours as needed for nausea or vomiting. .  pantoprazole (PROTONIX) 40 MG tablet, TAKE 1 TABLET(40 MG) BY MOUTH DAILY .  potassium chloride SA (KLOR-CON) 20 MEQ tablet, TAKE 1 TABLET EVERY DAY .  pregabalin (LYRICA) 300 MG capsule, TAKE 1 CAPSULE(300 MG) BY MOUTH TWICE DAILY .  traZODone (DESYREL) 50 MG tablet, TAKE 1 TABLET(50 MG) BY MOUTH AT BEDTIME AS NEEDED FOR SLEEP .  gabapentin (NEURONTIN) 400 MG capsule, Take 1 capsule (400 mg total) by mouth 4 (four) times daily. Annual appt due in Sept must see provider for future refills .  Vitamin D, Ergocalciferol, (DRISDOL) 1.25 MG (50000 UNIT) CAPS capsule, Take 1 capsule (50,000 Units total) by mouth every 7 (seven) days.   Reviewed prior external information including notes and imaging from  primary care provider As well as notes that were available from care everywhere and other healthcare systems.  Past medical history, social, surgical and family history all reviewed in electronic medical record.  [name] pertanent information unless stated regarding to the chief complaint.   Review of Systems:  [name] headache, visual changes, nausea, vomiting, diarrhea, constipation, dizziness, abdominal pain, skin rash, fevers, chills, night sweats, weight loss, swollen lymph nodes,  chest pain, shortness of breath, mood changes. POSITIVE muscle aches, body aches, joint swelling  Objective  Blood pressure 122/86, pulse 84, height _0  (1.676 m), weight 273 lb (123.8 kg), SpO2 96 %.   General: [name] apparent distress alert and oriented x3 mood and affect normal, dressed appropriately.  HEENT: Pupils equal, extraocular movements intact  Respiratory: Patient's speak in full sentences and does not appear short of breath  Cardiovascular: 2+ lower extremity edema to mid calves bilaterally, non tender, [name] erythema  Antalgic gait walking with the aid of a cane Knee: Right valgus  deformity noted.  Abnormal thigh to calf ratio.  Tender to palpation over medial and PF joint line.  ROM full in flexion and extension and lower leg rotation. instability with valgus force.  painful patellar compression. Patellar glide with moderate crepitus. Patellar and quadriceps tendons unremarkable. Hamstring and quadriceps strength is normal. Contralateral knee shows arthritic changes but stable  Low back exam has some loss of lordosis, tenderness to palpation in the paraspinal musculature lumbar spine right greater than left diffuse tightness noted.  Neurovascularly intact distally.  After informed written and verbal consent, patient was seated on exam table. Right knee was prepped with alcohol swab and utilizing anterolateral approach, patient's right knee space was injected with 4:1  marcaine 0.5%: Kenalog 15m/dL. Patient tolerated the procedure well without immediate complications.    Impression and Recommendations:     The above documentation has been reviewed and is accurate and complete ZLyndal Pulley DO

## 2020-02-06 ENCOUNTER — Ambulatory Visit (INDEPENDENT_AMBULATORY_CARE_PROVIDER_SITE_OTHER): Payer: Medicare HMO

## 2020-02-06 ENCOUNTER — Encounter: Payer: Self-pay | Admitting: Family

## 2020-02-06 ENCOUNTER — Other Ambulatory Visit: Payer: Self-pay

## 2020-02-06 ENCOUNTER — Ambulatory Visit (INDEPENDENT_AMBULATORY_CARE_PROVIDER_SITE_OTHER): Payer: Medicare HMO | Admitting: Family Medicine

## 2020-02-06 ENCOUNTER — Encounter: Payer: Self-pay | Admitting: Family Medicine

## 2020-02-06 VITALS — BP 122/86 | HR 84 | Ht 66.0 in | Wt 273.0 lb

## 2020-02-06 DIAGNOSIS — G8929 Other chronic pain: Secondary | ICD-10-CM | POA: Diagnosis not present

## 2020-02-06 DIAGNOSIS — M25561 Pain in right knee: Secondary | ICD-10-CM | POA: Diagnosis not present

## 2020-02-06 DIAGNOSIS — M1711 Unilateral primary osteoarthritis, right knee: Secondary | ICD-10-CM | POA: Diagnosis not present

## 2020-02-06 DIAGNOSIS — M5416 Radiculopathy, lumbar region: Secondary | ICD-10-CM | POA: Diagnosis not present

## 2020-02-06 NOTE — Assessment & Plan Note (Signed)
Patient is doing relatively well for 3 years.  Likely has had some significant progression of the arthritic changes.  X-rays ordered today.  Could be a candidate for potentially a custom OA stability brace with patient having an abnormal thigh to calf ratio and instability of the knee.  We will consider this.  Patient also could be a candidate for viscosupplementation and we will see if we get approval.  Hopefully patient will just do better again with the injection, encourage her to continue with physical therapy, encouraged continued attempt to for weight loss and follow-up with me again in 6 weeks

## 2020-02-06 NOTE — Assessment & Plan Note (Signed)
Encourage patient to continue to work on her weight.  Patient did not follow-up with the other nutritionist because she did not have a good relationship.  We discussed the potential for referring to another one but patient declined at this moment

## 2020-02-06 NOTE — Patient Instructions (Addendum)
Good to see you Knee injection today  Knee xray on the way out  Decrease lyrica once daily Make sure taking 60mg  cymbalta daily If using tramadol take a tylenol with it  For severe pain take 3 ibuprofen 2X a day for 3 days Ordered epidural they will call you to schedule  See me 4 weeks after epidural injection

## 2020-02-06 NOTE — Assessment & Plan Note (Signed)
Continued difficulty over the years but has done relatively well with epidurals.  Patient's last epidural was in April and had significant improvement with greater than 80% decrease in pain for multiple months.  I encouraged her to have the epidural again.  Encourage her to continue to work on weight loss.  Follow-up again in 4 weeks after the epidural

## 2020-02-07 ENCOUNTER — Other Ambulatory Visit: Payer: Self-pay | Admitting: Internal Medicine

## 2020-02-07 NOTE — Telephone Encounter (Signed)
Done erx 

## 2020-02-13 ENCOUNTER — Inpatient Hospital Stay: Admission: RE | Admit: 2020-02-13 | Payer: Medicare HMO | Source: Ambulatory Visit

## 2020-02-21 ENCOUNTER — Telehealth (INDEPENDENT_AMBULATORY_CARE_PROVIDER_SITE_OTHER): Payer: Medicare HMO | Admitting: Family Medicine

## 2020-02-21 ENCOUNTER — Inpatient Hospital Stay: Admission: RE | Admit: 2020-02-21 | Payer: Medicare HMO | Source: Ambulatory Visit

## 2020-02-21 ENCOUNTER — Other Ambulatory Visit: Payer: Self-pay | Admitting: Internal Medicine

## 2020-02-21 DIAGNOSIS — R197 Diarrhea, unspecified: Secondary | ICD-10-CM | POA: Diagnosis not present

## 2020-02-21 NOTE — Patient Instructions (Signed)
Get the C. difficile testing done with your specialist.  Get Covid testing as we discussed.  Imodium as needed per instructions.  Drink plenty of fluids with electrolytes and avoid dairy and red meat in your diet.  -[facility] COVID19 testing information: https://www.[facility].org/ OR [id number] Most pharmacies also offer Covid testing. Also, there are home test kits available at most pharmacies as well.    It was nice to meet you today, and I really hope you are feeling better soon. I help [facility] out with telemedicine visits on Tuesdays and Thursdays and am available for visits on those days. If you have any concerns or questions following this visit please schedule a follow up visit with your Primary Care doctor or seek care at a local urgent care clinic to avoid delays in care.    Seek in person care promptly if your symptoms worsen, new concerns arise or you are not improving with treatment. Call 911 and/or seek emergency care if you symptoms are severe or life threatening.   -follow up with your doctor in 2-3 days unless improving and feeling better

## 2020-02-21 NOTE — Progress Notes (Signed)
Virtual Visit via Video Note  I connected with [name]  on 02/21/20 at 10:20 AM EDT by a video enabled telemedicine application and verified that I am speaking with the correct person using two identifiers.  Location patient: home, [place] Location provider:work or home office Persons participating in the virtual visit: patient, provider  I discussed the limitations of evaluation and management by telemedicine and the availability of in person appointments. The patient expressed understanding and agreed to proceed.   HPI:  Acute telemedicine visit for : -Onset: about 5-6 days ago -Symptoms include: initially bowel urgency after eating and subjective fevers (this was after she got her flu shot and her COVID19 booster the day prior so she thought it was a side effect) -she still is having some diarrhea - about 2x yesterday and 1x today -Denies:fevers, vomiting, resp symptoms, melena, hematochezia -Has tried:staying hydrated, imodium helps when she takes -Pertinent past medical history: she has a history of C. Diff colitis - treated multiple times at [facility] in the past, most recently in July; Gastric Bypass -she reports she has contacted her GI doc to do C. Diff testing, but they want her to do Covid test first -Pertinent medication allergies: -COVID-19 vaccine status:fully vaccinated and had booster last week  ROS: See pertinent positives and negatives per HPI.  Past Medical History:  Diagnosis Date   Anemia    takes Ferrous Sulfate daily   Anxiety    takes Citalopram daily   Arthritis    Asthma    2004-prior to gastric bypass and no problems since   CHF (congestive heart failure) ([place])    takes Lasix daily as needed   Chronic back pain    spondylolisthesis/stenosis/radiculopathy   Complication of anesthesia yrs ago   slow to wake up   Depression    Diabetes ([place])    DVT (deep venous thrombosis) ([place]) 01/17/2013   past hx. -tx.5-6 yrs ago bilateral legs, occ. sporadic  swelling, has IVC filter implanted   Dysrhythmia    "heart tends to flutter"   Fibromyalgia    Fracture    right foot and is in a cam boot   Gallstones    GERD (gastroesophageal reflux disease)    hx of-no meds now   Heart murmur    History of bronchitis 2012 or 2013   History of colon polyps    Hypertension    takes Metoprolol daily   Insomnia    takes Melatonin daily   Pelvic floor dysfunction    Peripheral neuropathy    Pneumonia 90's   hx of   S/P gastric bypass 2003   Sleep apnea    no cpap used in many yrs after weight lost-no machine now   Urinary frequency    Urinary urgency    Vitamin D deficiency     Past Surgical History:  Procedure Laterality Date   BALLOON DILATION N/A 01/31/2013   Procedure: BALLOON DILATION;  Surgeon: Arta Silence, MD;  Location: [facility];  Service: Endoscopy;  Laterality: N/A;   CARDIAC EVENT MONITOR  03/2019   Predominantly sinus rhythm.  Rates range from 48-124 bpm.  Average 74 bpm.  Frequent short bursts (3-15 beats) PAT/PSVT--not indicated as being symptomatic on diary.  Otherwise rare PACs and PVCs.   CHOLECYSTECTOMY  2007   COLONOSCOPY     DILATION AND CURETTAGE OF UTERUS  yrs ago   ESOPHAGOGASTRODUODENOSCOPY (EGD) WITH PROPOFOL  03/01/2012   Procedure: ESOPHAGOGASTRODUODENOSCOPY (EGD) WITH PROPOFOL;  Surgeon: Arta Silence, MD;  Location:  [facility];  Service: Endoscopy;  Laterality: N/A;   ESOPHAGOGASTRODUODENOSCOPY (EGD) WITH PROPOFOL N/A 01/31/2013   Procedure: ESOPHAGOGASTRODUODENOSCOPY (EGD) WITH PROPOFOL;  Surgeon: Arta Silence, MD;  Location: [facility];  Service: Endoscopy;  Laterality: N/A;   ESOPHAGOGASTRODUODENOSCOPY (EGD) WITH PROPOFOL N/A 09/02/2015   Procedure: ESOPHAGOGASTRODUODENOSCOPY (EGD) WITH PROPOFOL;  Surgeon: Gatha Mayer, MD;  Location: [facility];  Service: Endoscopy;  Laterality: N/A;   GASTRIC BY-PASS  2004   HERNIA REPAIR  2005   INSERTION OF VENA CAVA FILTER   01-17-13   inserted 2004- "abdomen"   LIGAMENT REPAIR Right 1987   Rt. knee scope   MAXIMUM ACCESS (MAS)POSTERIOR LUMBAR INTERBODY FUSION (PLIF) 2 LEVEL N/A 06/07/2014   Procedure: L4-5 L5-S1 FOR MAXIMUM ACCESS (MAS) POSTERIOR LUMBAR INTERBODY FUSION ;  Surgeon: Erline Levine, MD;  Location: [facility];  Service: Neurosurgery;  Laterality: N/A;  L4-5 L5-S1 FOR MAXIMUM ACCESS (MAS) POSTERIOR LUMBAR INTERBODY FUSION    TONSILLECTOMY     as child   TRANSTHORACIC ECHOCARDIOGRAM  03/2019   EF 60 to 65%.  Mild LVH.  No R WMA.  Normal RV size.  Normal LA, mild RA dilation.  No aortic valve stenosis or sclerosis.     Current Outpatient Medications:    ACCU-CHEK SOFTCLIX LANCETS lancets, CHECK BLOOD SUGARS TWICE DAILY, Disp: 200 each, Rfl: 2   albuterol (VENTOLIN HFA) 108 (90 Base) MCG/ACT inhaler, INHALE 2 PUFFS INTO THE LUNGS EVERY 6 HOURS AS NEEDED FOR WHEEZING OR SHORTNESS OF BREATH, Disp: 3 each, Rfl: 2   Alcohol Swabs (B-D SINGLE USE SWABS REGULAR) PADS, USE AS DIRECTED EVERY DAY, Disp: 100 each, Rfl: 0   amLODipine (NORVASC) 10 MG tablet, TAKE 1 TABLET EVERY DAY.  ANNUAL APPOINTMENT DUE IN SEPT MUST SEE PROVIDER FOR FUTURE REFILLS., Disp: 90 tablet, Rfl: 0   BLACK COHOSH PO, Take 1 tablet by mouth daily., Disp: , Rfl:    Blood Glucose Calibration (ACCU-CHEK AVIVA) SOLN, Use as directed, Disp: 3 each, Rfl: 3   Blood Glucose Monitoring Suppl (ACCU-CHEK AVIVA PLUS) w/Device KIT, USE AS DIRECTED  TO CHECK BLOOD SUGAR EVERY DAY, Disp: 1 kit, Rfl: 0   DULoxetine (CYMBALTA) 30 MG capsule, TAKE 1 TO 2 CAPSULES EVERY DAY - ANNUAL APPOINTMENT DUE IN SEPT MUST SEE PROVIDER FOR FUTURE REFILLS., Disp: 180 capsule, Rfl: 0   furosemide (LASIX) 40 MG tablet, TAKE 1 TABLET DAILY AS NEEDED. ANNUAL APPOINTMENT DUE IN SEPT MUST SEE PROVIDER FOR FUTURE REFILLS., Disp: 90 tablet, Rfl: 0   gabapentin (NEURONTIN) 400 MG capsule, Take 1 capsule (400 mg total) by mouth 4 (four) times daily. Annual appt due  in Sept must see provider for future refills, Disp: 360 capsule, Rfl: 0   glucose blood (ACCU-CHEK AVIVA PLUS) test strip, 1 each by Other route 2 (two) times daily. Use to check blood sugars twice a day, Disp: 200 each, Rfl: 2   metoCLOPramide (REGLAN) 5 MG tablet, Take 1 tablet (5 mg total) by mouth 3 (three) times daily before meals., Disp: 90 tablet, Rfl: 3   metoprolol tartrate (LOPRESSOR) 50 MG tablet, Take 2 tablets (100 mg total) by mouth once for 1 dose. TAKE ONE HOUR PRIOR TO  SCHEDULE CARDIAC TEST, Disp: 2 tablet, Rfl: 0   Multiple Vitamins-Minerals (MULTIVITAMINS THER. W/MINERALS) TABS, Take 1 tablet by mouth daily. , Disp: , Rfl:    nitrofurantoin (MACRODANTIN) 100 MG capsule, , Disp: , Rfl:    ondansetron (ZOFRAN) 4 MG tablet, Take 1 tablet (4 mg total)  by mouth every 8 (eight) hours as needed for nausea or vomiting., Disp: 20 tablet, Rfl: 0   pantoprazole (PROTONIX) 40 MG tablet, TAKE 1 TABLET(40 MG) BY MOUTH DAILY, Disp: 30 tablet, Rfl: 2   potassium chloride SA (KLOR-CON) 20 MEQ tablet, TAKE 1 TABLET EVERY DAY, Disp: 90 tablet, Rfl: 0   pregabalin (LYRICA) 300 MG capsule, TAKE 1 CAPSULE(300 MG) BY MOUTH TWICE DAILY, Disp: 60 capsule, Rfl: 5   traMADol (ULTRAM) 50 MG tablet, Take 1 tablet (50 mg total) by mouth every 6 (six) hours as needed., Disp: 120 tablet, Rfl: 1   traZODone (DESYREL) 50 MG tablet, TAKE 1 TABLET AT BEDTIME AS NEEDED FOR SLEEP. ANNUAL APPT DUE IN SEPT MUST SEE PROVIDER FOR FUTURE REFILLS, Disp: 30 tablet, Rfl: 0   Vitamin D, Ergocalciferol, (DRISDOL) 1.25 MG (50000 UNIT) CAPS capsule, Take 1 capsule (50,000 Units total) by mouth every 7 (seven) days., Disp: 12 capsule, Rfl: 0  EXAM:  VITALS per patient if applicable:  GENERAL: alert, oriented, appears well and in no acute distress  HEENT: atraumatic, conjunttiva clear, no obvious abnormalities on inspection of external nose and ears  NECK: normal movements of the head and neck  LUNGS: on  inspection no signs of respiratory distress, breathing rate appears normal, no obvious gross SOB, gasping or wheezing  CV: no obvious cyanosis  MS: moves all visible extremities without noticeable abnormality  PSYCH/NEURO: pleasant and cooperative, no obvious depression or anxiety, speech and thought processing grossly intact  ASSESSMENT AND PLAN:  Discussed the following assessment and plan:  Diarrhea, unspecified type  -we discussed possible serious and likely etiologies, options for evaluation and workup, limitations of telemedicine visit vs in person visit, treatment, treatment risks and precautions. Pt prefers to treat via telemedicine empirically rather than in person at this moment.  Discussed potential etiologies including gastroenteritis, vaccine side effects, C. difficile colitis versus other.  She prefers and plans to do C. difficile testing with her specialist, as she has had recurrent C. difficile, treated by her specialist.  She plans to call them back this morning to see if she can drop off a stool sample.  They wanted her to get a Covid test, before doing an appointment there.  She has a procedure with them scheduled for next week per her report.  We talked about options for Covid testing, though I think this is less likely.  She plans to go do this today either via a home test kit or at one of the testing sites.  Advised in the interim could try a diet avoid of dairy and red meat, Imodium as needed and staying hydrated.  Advised if any worsening or other symptoms arise that she seek care at a local urgent care where they can do the Covid testing and the C. difficile testing.  She agrees to this plan.   Did let this patient know that I only do telemedicine on Tuesdays and Thursdays for [place]. Advised to schedule follow up visit with PCP or [facility] if any further questions or concerns to avoid delays in care.   I discussed the assessment and treatment plan with the patient. The patient  was provided an opportunity to ask questions and all were answered. The patient agreed with the plan and demonstrated an understanding of the instructions.     Lucretia Kern, DO

## 2020-02-22 ENCOUNTER — Encounter (HOSPITAL_COMMUNITY): Payer: Self-pay

## 2020-02-22 ENCOUNTER — Other Ambulatory Visit: Payer: Self-pay

## 2020-02-22 ENCOUNTER — Emergency Department (HOSPITAL_COMMUNITY)
Admission: EM | Admit: 2020-02-22 | Discharge: 2020-02-22 | Disposition: A | Discharge status: Home / Self Care | Payer: Medicare HMO | Attending: Emergency Medicine | Admitting: Emergency Medicine

## 2020-02-22 ENCOUNTER — Emergency Department (HOSPITAL_COMMUNITY): Payer: Medicare HMO

## 2020-02-22 DIAGNOSIS — R1084 Generalized abdominal pain: Secondary | ICD-10-CM | POA: Insufficient documentation

## 2020-02-22 DIAGNOSIS — R197 Diarrhea, unspecified: Secondary | ICD-10-CM | POA: Insufficient documentation

## 2020-02-22 DIAGNOSIS — N12 Tubulo-interstitial nephritis, not specified as acute or chronic: Secondary | ICD-10-CM

## 2020-02-22 DIAGNOSIS — R109 Unspecified abdominal pain: Secondary | ICD-10-CM | POA: Diagnosis not present

## 2020-02-22 DIAGNOSIS — R112 Nausea with vomiting, unspecified: Secondary | ICD-10-CM | POA: Diagnosis not present

## 2020-02-22 DIAGNOSIS — I509 Heart failure, unspecified: Secondary | ICD-10-CM | POA: Diagnosis not present

## 2020-02-22 DIAGNOSIS — Z79899 Other long term (current) drug therapy: Secondary | ICD-10-CM | POA: Insufficient documentation

## 2020-02-22 DIAGNOSIS — Z20822 Contact with and (suspected) exposure to covid-19: Secondary | ICD-10-CM | POA: Diagnosis not present

## 2020-02-22 DIAGNOSIS — I110 Hypertensive heart disease with heart failure: Secondary | ICD-10-CM | POA: Insufficient documentation

## 2020-02-22 DIAGNOSIS — J45909 Unspecified asthma, uncomplicated: Secondary | ICD-10-CM | POA: Insufficient documentation

## 2020-02-22 DIAGNOSIS — Z87891 Personal history of nicotine dependence: Secondary | ICD-10-CM | POA: Insufficient documentation

## 2020-02-22 DIAGNOSIS — E1140 Type 2 diabetes mellitus with diabetic neuropathy, unspecified: Secondary | ICD-10-CM | POA: Diagnosis not present

## 2020-02-22 LAB — CBC WITH DIFFERENTIAL/PLATELET
Abs Immature Granulocytes: 0.01 10*3/uL (ref 0.00–0.07)
Basophils Absolute: 0 10*3/uL (ref 0.0–0.1)
Basophils Relative: 1 %
Eosinophils Absolute: 0.1 10*3/uL (ref 0.0–0.5)
Eosinophils Relative: 2 %
HCT: 39.1 % (ref 36.0–46.0)
Hemoglobin: 12.5 g/dL (ref 12.0–15.0)
Immature Granulocytes: 0 %
Lymphocytes Relative: 27 %
Lymphs Abs: 1.7 10*3/uL (ref 0.7–4.0)
MCH: 27.7 pg (ref 26.0–34.0)
MCHC: 32 g/dL (ref 30.0–36.0)
MCV: 86.7 fL (ref 80.0–100.0)
Monocytes Absolute: 0.4 10*3/uL (ref 0.1–1.0)
Monocytes Relative: 7 %
Neutro Abs: 3.9 10*3/uL (ref 1.7–7.7)
Neutrophils Relative %: 63 %
Platelets: 198 10*3/uL (ref 150–400)
RBC: 4.51 MIL/uL (ref 3.87–5.11)
RDW: 16.3 % — ABNORMAL HIGH (ref 11.5–15.5)
WBC: 6.1 10*3/uL (ref 4.0–10.5)
nRBC: 0 % (ref 0.0–0.2)

## 2020-02-22 LAB — COMPREHENSIVE METABOLIC PANEL
ALT: 25 U/L (ref 0–44)
AST: 33 U/L (ref 15–41)
Albumin: 3.6 g/dL (ref 3.5–5.0)
Alkaline Phosphatase: 116 U/L (ref 38–126)
Anion gap: 9 (ref 5–15)
BUN: 14 mg/dL (ref 8–23)
CO2: 22 mmol/L (ref 22–32)
Calcium: 8.4 mg/dL — ABNORMAL LOW (ref 8.9–10.3)
Chloride: 113 mmol/L — ABNORMAL HIGH (ref 98–111)
Creatinine, Ser: 0.83 mg/dL (ref 0.44–1.00)
GFR, Estimated: >60 mL/min (ref >60)
Glucose, Bld: 94 mg/dL (ref 70–99)
Potassium: 4.1 mmol/L (ref 3.5–5.1)
Sodium: 144 mmol/L (ref 135–145)
Total Bilirubin: 0.3 mg/dL (ref 0.3–1.2)
Total Protein: 6.5 g/dL (ref 6.5–8.1)

## 2020-02-22 LAB — URINALYSIS, ROUTINE W REFLEX MICROSCOPIC
Bilirubin Urine: NEGATIVE
Glucose, UA: NEGATIVE mg/dL
Hgb urine dipstick: NEGATIVE
Ketones, ur: 5 mg/dL — AB
Nitrite: NEGATIVE
Protein, ur: NEGATIVE mg/dL
Specific Gravity, Urine: 1.02 (ref 1.005–1.030)
WBC, UA: >50 WBC/hpf — ABNORMAL HIGH (ref 0–5)
pH: 6 (ref 5.0–8.0)

## 2020-02-22 LAB — LIPASE, BLOOD: Lipase: 21 U/L (ref 11–51)

## 2020-02-22 LAB — RESPIRATORY PANEL BY RT PCR (FLU A&B, COVID)
Influenza A by PCR: NEGATIVE
Influenza B by PCR: NEGATIVE
SARS Coronavirus 2 by RT PCR: NEGATIVE

## 2020-02-22 IMAGING — XA Imaging study
2 series · 2 of 2 positions shown · non-contrast
Comparison: none

CLINICAL DATA: Lumbosacral spondylosis without myelopathy.
Bilateral low back pain with left leg radiculopathy. Prior L4-S1
fusion.

[Series 11: ortho adipose · 1 of 1 slices shown (1 of 2)]
[im 1/1]
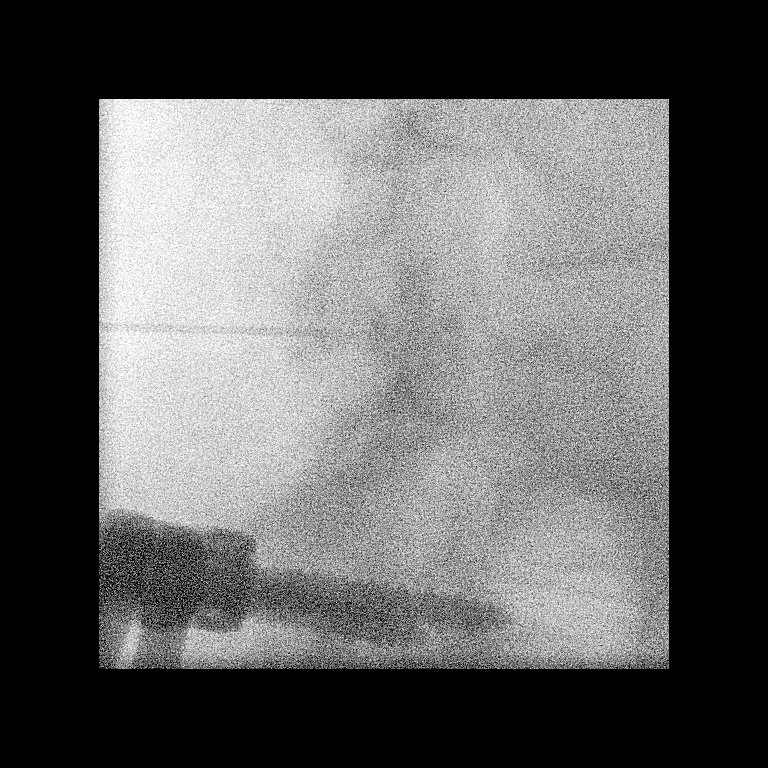

[Series 12: ortho adipose · 1 of 1 slices shown (2 of 2)]
[im 1/1]
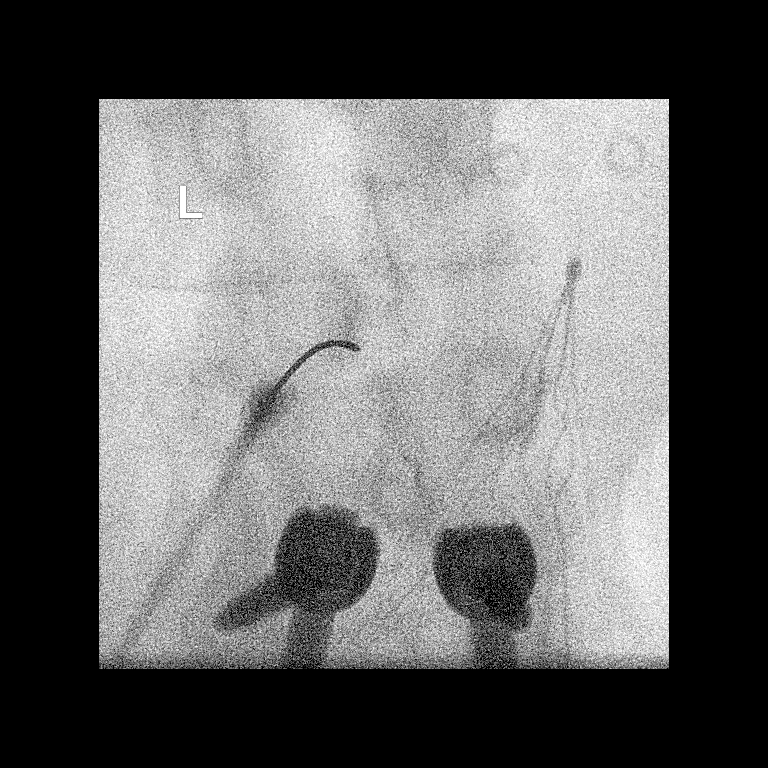

[2 of 2 positions shown; findings below may reference images not displayed]

FLUOROSCOPY TIME:  Radiation Exposure Index (as provided by the
fluoroscopic device): 15.2 mGy

Fluoroscopy Time:  4 seconds

Number of Acquired Images:  0

PROCEDURE:
The procedure, risks, benefits, and alternatives were explained to
the patient. Questions regarding the procedure were encouraged and
answered. The patient understands and consents to the procedure.

LUMBAR EPIDURAL INJECTION:

An interlaminar approach was performed on the left at L2-L3. The
overlying skin was cleansed and anesthetized. A 6 inch 20 gauge
epidural needle was advanced using loss-of-resistance technique.

DIAGNOSTIC EPIDURAL INJECTION:

Injection of Isovue-M 200 shows a good epidural pattern with spread
above and below the level of needle placement, primarily on the
left. No vascular opacification is seen.

THERAPEUTIC EPIDURAL INJECTION:

120 mg of Depo-Medrol mixed with 3 mL of 1% lidocaine were
instilled. The procedure was well-tolerated, and the patient was
discharged thirty minutes following the injection in good condition.

COMPLICATIONS:
None immediate.
IMPRESSION: Technically successful epidural injection on the left at L2-L3 #1.

## 2020-02-22 MED ORDER — CEFPODOXIME PROXETIL 200 MG PO TABS: 200.0000 mg | ORAL_TABLET | Freq: Two times a day (BID) | ORAL | 0 refills | Status: AC

## 2020-02-22 MED ORDER — ONDANSETRON HCL 4 MG/2ML IJ SOLN
4.0000 mg | Freq: Once | INTRAMUSCULAR | Status: AC
  Administered 2020-02-22: 4 mg via INTRAVENOUS
  Filled 2020-02-22: qty 2

## 2020-02-22 MED ORDER — ONDANSETRON 4 MG PO TBDP: 4.0000 mg | ORAL_TABLET | Freq: Three times a day (TID) | ORAL | 0 refills | Status: DC | PRN

## 2020-02-22 MED ORDER — FENTANYL CITRATE (PF) 100 MCG/2ML IJ SOLN
50.0000 ug | Freq: Once | INTRAMUSCULAR | Status: AC
  Administered 2020-02-22: 50 ug via INTRAVENOUS
  Filled 2020-02-22: qty 2

## 2020-02-22 MED ORDER — SODIUM CHLORIDE 0.9 % IV SOLN
1.0000 g | Freq: Once | INTRAVENOUS | Status: AC
  Administered 2020-02-22: 1 g via INTRAVENOUS
  Filled 2020-02-22: qty 10

## 2020-02-22 MED ORDER — SODIUM CHLORIDE 0.9 % IV BOLUS
500.0000 mL | Freq: Once | INTRAVENOUS | Status: AC
  Administered 2020-02-22: 500 mL via INTRAVENOUS

## 2020-02-22 MED ORDER — IOHEXOL 300 MG/ML  SOLN
100.0000 mL | Freq: Once | INTRAMUSCULAR | Status: AC | PRN
  Administered 2020-02-22: 100 mL via INTRAVENOUS

## 2020-02-22 NOTE — ED Provider Notes (Signed)
[facility] DEPT Provider Note   CSN: [id number] Arrival date & time: 02/22/20  1347     History Chief Complaint  Patient presents with  . Diarrhea  . Abdominal Pain    [name] is a 64 y.o. female.  64 year old female with history of C. difficile, CHF, diabetes, gastric bypass and additional history listed below presents with complaint of diarrhea and abdominal pain.  Patient states symptoms started about 5 to 6 days ago.  Reports 2 to 3 mucus, nonbloody stools daily, vomiting starting this morning.  Denies fevers, chills.  Patient's last episode of C. difficile was in July, denies recent antibiotics or sick contacts.  No other complaints or concerns.        Past Medical History:  Diagnosis Date  . Anemia    takes Ferrous Sulfate daily  . Anxiety    takes Citalopram daily  . Arthritis   . Asthma    2004-prior to gastric bypass and no problems since  . CHF (congestive heart failure) (HCC)    takes Lasix daily as needed  . Chronic back pain    spondylolisthesis/stenosis/radiculopathy  . Complication of anesthesia yrs ago   slow to wake up  . Depression   . Diabetes ([place])   . DVT (deep venous thrombosis) ([place]) 01/17/2013   past hx. -tx.5-6 yrs ago bilateral legs, occ. sporadic swelling, has IVC filter implanted  . Dysrhythmia    "heart tends to flutter"  . Fibromyalgia   . Fracture    right foot and is in a cam boot  . Gallstones   . GERD (gastroesophageal reflux disease)    hx of-no meds now  . Heart murmur   . History of bronchitis 2012 or 2013  . History of colon polyps   . Hypertension    takes Metoprolol daily  . Insomnia    takes Melatonin daily  . Pelvic floor dysfunction   . Peripheral neuropathy   . Pneumonia 90's   hx of  . S/P gastric bypass 2003  . Sleep apnea    no cpap used in many yrs after weight lost-no machine now  . Urinary frequency   . Urinary urgency   . Vitamin D deficiency     Patient  Active Problem List   Diagnosis Date Noted  . Abnormal urine odor 08/10/2019  . Gastrointestinal complaint 07/05/2019  . Muscle strain of gluteal region, right, initial encounter 05/11/2019  . Dyspnea on exertion 03/13/2019  . Rapid heartbeat 03/13/2019  . Systolic ejection murmur 89/38/1017  . Leg swelling 10/09/2018  . Insomnia 05/19/2018  . Frequent refractory urinary tract infections 05/05/2018  . Right leg pain 12/16/2017  . History of deep vein thrombosis (DVT) of lower extremity 12/12/2017  . Cavovarus deformity of foot, acquired, unspecified laterality 04/07/2017  . S/P gastric bypass 08/18/2016  . Muscle cramps 07/09/2016  . Adjustment disorder with mixed anxiety and depressed mood 03/05/2016  . Fatigue 03/05/2016  . Cervical disc disorder with radiculopathy of cervical region 02/27/2016  . Degenerative arthritis of right knee 02/27/2016  . Right knee pain 02/24/2016  . Left shoulder pain 02/24/2016  . Routine general medical examination at a health care facility 12/12/2015  . Schatzki's ring   . Lumbar radiculopathy 03/05/2015  . Peripheral neuropathy 01/17/2015  . Chest pain with moderate risk for cardiac etiology 05/21/2013  . Fibromyalgia 02/21/2011  . Essential hypertension 12/04/2006  . Hx of diabetes mellitus 10/20/2006  . Morbid obesity ([place]) 10/20/2006  .  MDD (major depressive disorder) ([place]) 10/20/2006  . OBSTRUCTIVE SLEEP APNEA 10/20/2006  . OSTEOARTHRITIS 10/20/2006    Past Surgical History:  Procedure Laterality Date  . BALLOON DILATION N/A 01/31/2013   Procedure: BALLOON DILATION;  Surgeon: Arta Silence, MD;  Location: [facility];  Service: Endoscopy;  Laterality: N/A;  . CARDIAC EVENT MONITOR  03/2019   Predominantly sinus rhythm.  Rates range from 48-124 bpm.  Average 74 bpm.  Frequent short bursts (3-15 beats) PAT/PSVT--not indicated as being symptomatic on diary.  Otherwise rare PACs and PVCs.  . CHOLECYSTECTOMY  2007  . COLONOSCOPY    .  DILATION AND CURETTAGE OF UTERUS  yrs ago  . ESOPHAGOGASTRODUODENOSCOPY (EGD) WITH PROPOFOL  03/01/2012   Procedure: ESOPHAGOGASTRODUODENOSCOPY (EGD) WITH PROPOFOL;  Surgeon: Arta Silence, MD;  Location: [facility];  Service: Endoscopy;  Laterality: N/A;  . ESOPHAGOGASTRODUODENOSCOPY (EGD) WITH PROPOFOL N/A 01/31/2013   Procedure: ESOPHAGOGASTRODUODENOSCOPY (EGD) WITH PROPOFOL;  Surgeon: Arta Silence, MD;  Location: [facility];  Service: Endoscopy;  Laterality: N/A;  . ESOPHAGOGASTRODUODENOSCOPY (EGD) WITH PROPOFOL N/A 09/02/2015   Procedure: ESOPHAGOGASTRODUODENOSCOPY (EGD) WITH PROPOFOL;  Surgeon: Gatha Mayer, MD;  Location: [facility];  Service: Endoscopy;  Laterality: N/A;  . GASTRIC BY-PASS  2004  . HERNIA REPAIR  2005  . INSERTION OF VENA CAVA FILTER  01-17-13   inserted 2004- "abdomen"  . LIGAMENT REPAIR Right 1987   Rt. knee scope  . MAXIMUM ACCESS (MAS)POSTERIOR LUMBAR INTERBODY FUSION (PLIF) 2 LEVEL N/A 06/07/2014   Procedure: L4-5 L5-S1 FOR MAXIMUM ACCESS (MAS) POSTERIOR LUMBAR INTERBODY FUSION ;  Surgeon: Erline Levine, MD;  Location: [facility];  Service: Neurosurgery;  Laterality: N/A;  L4-5 L5-S1 FOR MAXIMUM ACCESS (MAS) POSTERIOR LUMBAR INTERBODY FUSION   . TONSILLECTOMY     as child  . TRANSTHORACIC ECHOCARDIOGRAM  03/2019   EF 60 to 65%.  Mild LVH.  No R WMA.  Normal RV size.  Normal LA, mild RA dilation.  No aortic valve stenosis or sclerosis.     OB History   No obstetric history on file.     Family History  Problem Relation Age of Onset  . Diabetes Mother   . Atrial fibrillation Mother   . Hypertension Mother   . Heart disease Father        Unsure of details  . Hypertension Father   . Prostate cancer Father   . Alzheimer's disease Father   . Kidney disease Brother   . Healthy Sister   . Diabetes Maternal Grandmother   . Heart disease Maternal Grandmother   . Stroke Maternal Grandfather 50  . Hypertension Maternal Grandfather   . Hypertension  Paternal Grandmother   . Alzheimer's disease Paternal Grandmother   . Colon cancer Neg Hx   . Colon polyps Neg Hx   . Stomach cancer Neg Hx   . Esophageal cancer Neg Hx   . Pancreatic cancer Neg Hx   . Liver disease Neg Hx     Social History   Tobacco Use  . Smoking status: Former Smoker    Packs/day: 0.50    Years: 10.00    Pack years: 5.00    Types: Cigarettes    Quit date: 04/19/2009    Years since quitting: 10.8  . Smokeless tobacco: Never Used  Substance Use Topics  . Alcohol use: Yes    Alcohol/week: 0.0 standard drinks    Comment: occ. social- wine-1 drink monthly  . Drug use: No    Home Medications Prior to Admission  medications   Medication Sig Start Date End Date Taking? Authorizing Provider  ACCU-CHEK SOFTCLIX LANCETS lancets CHECK BLOOD SUGARS TWICE DAILY 01/26/17   Hoyt Koch, MD  albuterol (VENTOLIN HFA) 108 (90 Base) MCG/ACT inhaler INHALE 2 PUFFS INTO THE LUNGS EVERY 6 HOURS AS NEEDED FOR WHEEZING OR SHORTNESS OF BREATH 02/07/20   Hoyt Koch, MD  Alcohol Swabs (B-D SINGLE USE SWABS REGULAR) PADS USE AS DIRECTED EVERY DAY 01/30/20   Hoyt Koch, MD  amLODipine (NORVASC) 10 MG tablet TAKE 1 TABLET EVERY DAY.  ANNUAL APPOINTMENT DUE IN SEPT MUST SEE PROVIDER FOR FUTURE REFILLS. 02/21/20   Hoyt Koch, MD  BLACK COHOSH PO Take 1 tablet by mouth daily.    [provider]  Blood Glucose Calibration (ACCU-CHEK AVIVA) SOLN Use as directed 10/29/15   Hoyt Koch, MD  Blood Glucose Monitoring Suppl (ACCU-CHEK AVIVA PLUS) w/Device KIT USE AS DIRECTED  TO CHECK BLOOD SUGAR EVERY DAY 11/05/19   Hoyt Koch, MD  DULoxetine (CYMBALTA) 30 MG capsule TAKE 1 TO 2 CAPSULES EVERY DAY - ANNUAL APPOINTMENT DUE IN SEPT MUST SEE PROVIDER FOR FUTURE REFILLS. 02/21/20   Hoyt Koch, MD  furosemide (LASIX) 40 MG tablet TAKE 1 TABLET DAILY AS NEEDED. ANNUAL APPOINTMENT DUE IN SEPT MUST SEE PROVIDER FOR FUTURE  REFILLS. 01/30/20   Hoyt Koch, MD  gabapentin (NEURONTIN) 400 MG capsule Take 1 capsule (400 mg total) by mouth 4 (four) times daily. Annual appt due in Sept must see provider for future refills 11/05/19   Hoyt Koch, MD  glucose blood (ACCU-CHEK AVIVA PLUS) test strip 1 each by Other route 2 (two) times daily. Use to check blood sugars twice a day 11/15/19   Hoyt Koch, MD  metoCLOPramide (REGLAN) 5 MG tablet Take 1 tablet (5 mg total) by mouth 3 (three) times daily before meals. 10/09/19   Hoyt Koch, MD  metoprolol tartrate (LOPRESSOR) 50 MG tablet Take 2 tablets (100 mg total) by mouth once for 1 dose. TAKE ONE HOUR PRIOR TO  SCHEDULE CARDIAC TEST 03/13/19 08/08/19  Leonie Man, MD  Multiple Vitamins-Minerals (MULTIVITAMINS THER. W/MINERALS) TABS Take 1 tablet by mouth daily.     [provider]  nitrofurantoin (MACRODANTIN) 100 MG capsule  09/20/19   [provider]  ondansetron (ZOFRAN) 4 MG tablet Take 1 tablet (4 mg total) by mouth every 8 (eight) hours as needed for nausea or vomiting. 07/05/19   Hoyt Koch, MD  pantoprazole (PROTONIX) 40 MG tablet TAKE 1 TABLET(40 MG) BY MOUTH DAILY 11/07/19   Hoyt Koch, MD  potassium chloride SA (KLOR-CON) 20 MEQ tablet TAKE 1 TABLET EVERY DAY 01/30/20   Hoyt Koch, MD  pregabalin (LYRICA) 300 MG capsule TAKE 1 CAPSULE(300 MG) BY MOUTH TWICE DAILY 08/10/19   Hoyt Koch, MD  traMADol (ULTRAM) 50 MG tablet Take 1 tablet (50 mg total) by mouth every 6 (six) hours as needed. 12/25/19   Janith Lima, MD  traZODone (DESYREL) 50 MG tablet TAKE 1 TABLET AT BEDTIME AS NEEDED FOR SLEEP. ANNUAL APPT DUE IN SEPT MUST SEE PROVIDER FOR FUTURE REFILLS 02/07/20   Biagio Borg, MD  Vitamin D, Ergocalciferol, (DRISDOL) 1.25 MG (50000 UNIT) CAPS capsule Take 1 capsule (50,000 Units total) by mouth every 7 (seven) days. 09/18/19   Hoyt Koch, MD    Allergies      Carafate [sucralfate] and Sulfonamide derivatives  Review of Systems  Review of Systems  Constitutional: Negative for chills and fever.  Respiratory: Negative for shortness of breath.   Cardiovascular: Negative for chest pain.  Gastrointestinal: Positive for abdominal pain, diarrhea, nausea and vomiting. Negative for blood in stool and constipation.  Genitourinary: Negative for dysuria and frequency.  Musculoskeletal: Negative for arthralgias and myalgias.  Skin: Negative for rash and wound.  Neurological: Positive for weakness.  Hematological: Negative for adenopathy.  Psychiatric/Behavioral: Negative for confusion.  All other systems reviewed and are negative.   Physical Exam Updated Vital Signs BP (!) 156/78 (BP Location: Left Arm)   Pulse (!) 55   Temp 98.1 F (36.7 C) (Oral)   Resp 16   SpO2 98%   Physical Exam Vitals and nursing note reviewed.  Constitutional:      General: She is not in acute distress.    Appearance: She is well-developed. She is not diaphoretic.  HENT:     Head: Normocephalic and atraumatic.  Cardiovascular:     Rate and Rhythm: Normal rate and regular rhythm.     Heart sounds: Normal heart sounds.  Pulmonary:     Effort: Pulmonary effort is normal.     Breath sounds: Normal breath sounds.  Abdominal:     General: A surgical scar is present.     Palpations: Abdomen is soft.     Tenderness: There is generalized abdominal tenderness.  Skin:    General: Skin is warm and dry.     Findings: No erythema or rash.  Neurological:     Mental Status: She is alert and oriented to person, place, and time.  Psychiatric:        Behavior: Behavior normal.     ED Results / Procedures / Treatments   Labs (all labs ordered are listed, but only abnormal results are displayed) Labs Reviewed  RESPIRATORY PANEL BY RT PCR (FLU A&B, COVID)  GASTROINTESTINAL PANEL BY PCR, STOOL (REPLACES STOOL CULTURE)  C DIFFICILE QUICK SCREEN W PCR REFLEX  LIPASE,  BLOOD  COMPREHENSIVE METABOLIC PANEL  CBC  URINALYSIS, ROUTINE W REFLEX MICROSCOPIC    EKG None  Radiology No results found.  Procedures Procedures (including critical care time)  Medications Ordered in ED Medications  sodium chloride 0.9 % bolus 500 mL (has no administration in time range)    ED Course  I have reviewed the triage vital signs and the nursing notes.  Pertinent labs & imaging results that were available during my care of the patient were reviewed by me and considered in my medical decision making (see chart for details).  Clinical Course as of Feb 21 1441  Fri Feb 21, 4838  1064 64 year old female presents with complaint of abdominal pain with nausea, vomiting, diarrhea.  On exam, patient is nontoxic-appearing although appears to feel unwell, found to have moderate generalized abdominal tenderness.  Patient is afebrile, vitals not significant findings on presentation. Plan is for gentle hydration (denies history of CHF however CHF history on chart), labs including C. difficile due to history, Covid due to patient concern and request, CT abdomen pelvis to evaluate for diverticulitis, colitis, gastroenteritis, bowel obstruction.  Patient declines medications for pain or vomiting at this time.   [LM]  [id number] Care signed out at change of shift pending labs and imaging.    [LM]    Clinical Course User Index [LM] Roque Lias   MDM Rules/Calculators/A&P  Final Clinical Impression(s) / ED Diagnoses Final diagnoses:  Generalized abdominal pain  Nausea vomiting and diarrhea    Rx / DC Orders ED Discharge Orders    None       Tacy Learn, PA-C 02/22/20 1442    Wyvonnia Dusky, MD 02/22/20 1451

## 2020-02-22 NOTE — ED Notes (Signed)
Pt ambulatory to bathroom

## 2020-02-22 NOTE — ED Triage Notes (Signed)
Pt presents with c/o abdominal pain and diarrhea for one week.

## 2020-02-22 NOTE — ED Provider Notes (Signed)
Care assumed at shift change from [facility] pending work-up and disposition.  See her note for full HPI. Briefly, patient presenting with 5 to 6 days of diarrhea with abdominal pain.  No fevers or recent antibiotics though did have episode of C. difficile in July.  Labs and CT imaging pending at this time.  She has been hydrated with 500 cc of IV fluids.  Not requiring pain medication on initial evaluation.  Physical Exam  BP (!) 178/104   Pulse 61   Temp 98.1 F (36.7 C) (Oral)   Resp 18   SpO2 100%   Physical Exam Vitals and nursing note reviewed.  Constitutional:      General: She is not in acute distress.    Appearance: She is well-developed.  HENT:     Head: Normocephalic and atraumatic.  Eyes:     Conjunctiva/sclera: Conjunctivae normal.  Cardiovascular:     Rate and Rhythm: Normal rate.  Pulmonary:     Effort: Pulmonary effort is normal.  Abdominal:     Palpations: Abdomen is soft.  Skin:    General: Skin is warm.  Neurological:     Mental Status: She is alert.  Psychiatric:        Behavior: Behavior normal.     ED Course/Procedures   Results for orders placed or performed during the hospital encounter of 02/22/20  Respiratory Panel by RT PCR (Flu A&B, Covid) - Nasopharyngeal Swab   Specimen: Nasopharyngeal Swab  Result Value Ref Range   SARS Coronavirus 2 by RT PCR NEGATIVE NEGATIVE   Influenza A by PCR NEGATIVE NEGATIVE   Influenza B by PCR NEGATIVE NEGATIVE  Lipase, blood  Result Value Ref Range   Lipase 21 11 - 51 U/L  Comprehensive metabolic panel  Result Value Ref Range   Sodium 144 135 - 145 mmol/L   Potassium 4.1 3.5 - 5.1 mmol/L   Chloride 113 (H) 98 - 111 mmol/L   CO2 22 22 - 32 mmol/L   Glucose, Bld 94 70 - 99 mg/dL   BUN 14 8 - 23 mg/dL   Creatinine, Ser 0.83 0.44 - 1.00 mg/dL   Calcium 8.4 (L) 8.9 - 10.3 mg/dL   Total Protein 6.5 6.5 - 8.1 g/dL   Albumin 3.6 3.5 - 5.0 g/dL   AST 33 15 - 41 U/L   ALT 25 0 - 44 U/L   Alkaline Phosphatase  116 38 - 126 U/L   Total Bilirubin 0.3 0.3 - 1.2 mg/dL   GFR, Estimated >60 >60 mL/min   Anion gap 9 5 - 15  Urinalysis, Routine w reflex microscopic  Result Value Ref Range   Color, Urine YELLOW YELLOW   APPearance HAZY (A) CLEAR   Specific Gravity, Urine 1.020 1.005 - 1.030   pH 6.0 5.0 - 8.0   Glucose, UA NEGATIVE NEGATIVE mg/dL   Hgb urine dipstick NEGATIVE NEGATIVE   Bilirubin Urine NEGATIVE NEGATIVE   Ketones, ur 5 (A) NEGATIVE mg/dL   Protein, ur NEGATIVE NEGATIVE mg/dL   Nitrite NEGATIVE NEGATIVE   Leukocytes,Ua LARGE (A) NEGATIVE   RBC / HPF 0-5 0 - 5 RBC/hpf   WBC, UA >50 (H) 0 - 5 WBC/hpf   Bacteria, UA MANY (A) NONE SEEN   Squamous Epithelial / LPF 0-5 0 - 5   Mucus PRESENT   CBC with Differential  Result Value Ref Range   WBC 6.1 4.0 - 10.5 K/uL   RBC 4.51 3.87 - 5.11 MIL/uL   Hemoglobin 12.5  12.0 - 15.0 g/dL   HCT 39.1 36 - 46 %   MCV 86.7 80.0 - 100.0 fL   MCH 27.7 26.0 - 34.0 pg   MCHC 32.0 30.0 - 36.0 g/dL   RDW 16.3 (H) 11.5 - 15.5 %   Platelets 198 150 - 400 K/uL   nRBC 0.0 0.0 - 0.2 %   Neutrophils Relative % 63 %   Neutro Abs 3.9 1.7 - 7.7 K/uL   Lymphocytes Relative 27 %   Lymphs Abs 1.7 0.7 - 4.0 K/uL   Monocytes Relative 7 %   Monocytes Absolute 0.4 0.1 - 1.0 K/uL   Eosinophils Relative 2 %   Eosinophils Absolute 0.1 0.0 - 0.5 K/uL   Basophils Relative 1 %   Basophils Absolute 0.0 0.0 - 0.1 K/uL   Immature Granulocytes 0 %   Abs Immature Granulocytes 0.01 0.00 - 0.07 K/uL   CT Abdomen Pelvis W Contrast  Result Date: 02/22/2020 CLINICAL DATA:  Nonlocalized abdominal pain and diarrhea EXAM: CT ABDOMEN AND PELVIS WITH CONTRAST TECHNIQUE: Multidetector CT imaging of the abdomen and pelvis was performed using the standard protocol following bolus administration of intravenous contrast. CONTRAST:  175mL OMNIPAQUE IOHEXOL 300 MG/ML  SOLN COMPARISON:  None. FINDINGS: Lower chest: The visualized heart size within normal limits. No pericardial  fluid/thickening. No hiatal hernia. The visualized portions of the lungs are clear. Hepatobiliary: The liver is normal in density without focal abnormality.The main portal vein is patent. The patient is status post cholecystectomy. No biliary ductal dilation. Pancreas: Unremarkable. No pancreatic ductal dilatation or surrounding inflammatory changes. Spleen: Normal in size without focal abnormality. Adrenals/Urinary Tract: Both adrenal glands appear normal. The kidneys and collecting system appear normal without evidence of urinary tract calculus or hydronephrosis. Bladder is unremarkable. Stomach/Bowel: The patient is status post gastric bypass. The small bowel and colon are unremarkable. No wall thickening or surrounding inflammatory changes. Vascular/Lymphatic: There are no enlarged mesenteric, retroperitoneal, or pelvic lymph nodes. An infrarenal IVC filter is again identified. Reproductive: The uterus and adnexa are unremarkable. Other: Abdominal wall mesh hernia repair is seen. Musculoskeletal: A levoconvex scoliotic curvature is noted. There is posterior lumbar spine fixation from L4 through S1. IMPRESSION: No acute intra-abdominal or pelvic pathology to explain the patient's symptoms. Electronically Signed   By: Prudencio Pair M.D.   On: 02/22/2020 17:38   DG Knee 3 Views Right  Result Date: 02/07/2020 CLINICAL DATA:  Right knee pain EXAM: RIGHT KNEE - 3 VIEW COMPARISON:  None. FINDINGS: Three view radiograph right knee demonstrates normal alignment. No fracture or dislocation. There is mild tricompartmental degenerative arthritis, most severe within the medial compartment, with osteophyte formation and asymmetric joint space narrowing. No effusion. Mild diffuse subcutaneous edema. IMPRESSION: Mild tricompartmental degenerative arthritis, most severe within the medial compartment. Electronically Signed   By: Fidela Salisbury MD   On: 02/07/2020 08:38      Clinical Course as of Feb 21 1858  Ludwig Clarks Feb 21, 7541  2091 64 year old female presents with complaint of abdominal pain with nausea, vomiting, diarrhea.  On exam, patient is nontoxic-appearing although appears to feel unwell, found to have moderate generalized abdominal tenderness.  Patient is afebrile, vitals not significant findings on presentation. Plan is for gentle hydration (denies history of CHF however CHF history on chart), labs including C. difficile due to history, Covid due to patient concern and request, CT abdomen pelvis to evaluate for diverticulitis, colitis, gastroenteritis, bowel obstruction.  Patient declines medications for pain or vomiting at  this time.   [LM]  5638 Care signed out at change of shift pending labs and imaging.    [LM]  68 64 yo female w/ hx of c diff presenting to ED with diarrhea x 5 days, cramping abdominal pain.  On exam she has diffuse lower abd tenderness, but does not appear toxic.  This may be colitis vs C diff vs reaction to her covid booster (the day before symptom onset).  Pending labs, stool studies, CT abdomen, reassessment   [MT]  7564 Patient evaluated. Reports nausea, and worsening lower abdominal pain. Will dose of pain medication and antiemetic. Patient ready to provide stool sample.   [JR]  1803 Discussed negative CT scan.  Also discussed UA findings.  Patient endorses dysuria and frequency that began 1 wk ago.  She treated the symptoms with cranberry pills.  She notes her pain is in her lower abdomen and right CVA region.  Will treat with antibiotic to cover UTI and possible pyelonephritis as cause of patient's nausea vomiting, abdominal pain, flank pain.  Urine culture ordered.  Also discussed possibility of viral gastroenteritis and supportive measures.  She verbalized understanding and feels comfortable with discharge at this time.  Close PCP follow-up recommended on Monday.  Return precautions.   [JR]    Clinical Course User Index [JR] Nohea Kras, [place] N, PA-C [LM] Tacy Learn,  PA-C [MT] Wyvonnia Dusky, MD    Procedures        Brynlyn Dade, [place] N, PA-C 02/22/20 1859    Davonna Belling, MD 02/23/20 1451

## 2020-02-22 NOTE — Discharge Instructions (Signed)
Please read instructions below. Drink clear liquids until your stomach feels better. Then, slowly introduce bland foods into your diet as tolerated, such as bread, rice, apples, bananas. You can take zofran every 8 hours as needed for nausea. Starting tomorrow, take the antibiotic as directed until gone. You can continue taking her prescribed tramadol as needed for pain. Schedule appointment with your primary care provider on Monday for close follow-up. Return to the ER for worsening abdominal pain, persistent fever, uncontrollable vomiting, or new or concerning symptoms.

## 2020-02-24 LAB — URINE CULTURE

## 2020-02-28 ENCOUNTER — Other Ambulatory Visit: Payer: Self-pay

## 2020-02-28 ENCOUNTER — Ambulatory Visit (INDEPENDENT_AMBULATORY_CARE_PROVIDER_SITE_OTHER): Payer: Medicare HMO | Admitting: Internal Medicine

## 2020-02-28 ENCOUNTER — Encounter: Payer: Self-pay | Admitting: Internal Medicine

## 2020-02-28 VITALS — BP 140/88 | HR 82 | Temp 99.2°F | Ht 66.0 in | Wt 252.0 lb

## 2020-02-28 DIAGNOSIS — R5383 Other fatigue: Secondary | ICD-10-CM | POA: Diagnosis not present

## 2020-02-28 DIAGNOSIS — M797 Fibromyalgia: Secondary | ICD-10-CM

## 2020-02-28 DIAGNOSIS — A498 Other bacterial infections of unspecified site: Secondary | ICD-10-CM | POA: Diagnosis not present

## 2020-02-28 DIAGNOSIS — R300 Dysuria: Secondary | ICD-10-CM | POA: Diagnosis not present

## 2020-02-28 DIAGNOSIS — N12 Tubulo-interstitial nephritis, not specified as acute or chronic: Secondary | ICD-10-CM | POA: Diagnosis not present

## 2020-02-28 HISTORY — DX: Other bacterial infections of unspecified site: A49.8

## 2020-02-28 LAB — POCT URINALYSIS DIPSTICK
Blood, UA: NEGATIVE
Glucose, UA: NEGATIVE
Ketones, UA: POSITIVE
Leukocytes, UA: NEGATIVE
Nitrite, UA: NEGATIVE
Protein, UA: POSITIVE — AB
Spec Grav, UA: >=1.03 — AB (ref 1.010–1.025)
Urobilinogen, UA: 0.2 E.U./dL
pH, UA: 6 (ref 5.0–8.0)

## 2020-02-28 LAB — POCT URINALYSIS DIPSTICK OB
Blood, UA: NEGATIVE
Glucose, UA: NEGATIVE
Ketones, UA: POSITIVE
Leukocytes, UA: NEGATIVE
Nitrite, UA: NEGATIVE
Spec Grav, UA: >=1.03 — AB (ref 1.010–1.025)
Urobilinogen, UA: 0.2 E.U./dL
pH, UA: 6 (ref 5.0–8.0)

## 2020-02-28 NOTE — Assessment & Plan Note (Signed)
Will stop antibiotic today as she has had > 7 day course with culture with multiple species. Given her history of C dif it is important to use shorter courses of antibiotics when appropriate. ER was treating for pyelonephritis but CT scan without inflammation evident.

## 2020-02-28 NOTE — Assessment & Plan Note (Signed)
Ordered C dif test. If still having symptoms and urine culture clear will have her do stool sample for C dif given GI symptoms and multiple recurrent C dif. Last long taper vancomycin ended Sept 2021.

## 2020-02-28 NOTE — Patient Instructions (Addendum)
We will send the urine for culture but it looks like the infection is gone.   Stop taking the antibiotic until we get the culture back.   We can do a C dif test on the stool if you do not improve.

## 2020-02-28 NOTE — Assessment & Plan Note (Signed)
It is unclear if this is an accurate diagnosis and given recurrent C dif in the past will stop antibiotic and send for repeat U/A and culture. U/A done in the office not consistent with infection at this time.

## 2020-02-28 NOTE — Assessment & Plan Note (Signed)
She has recently weaned off lyrica which could cause some of her fatigue however it is not clear that this has caused all of her symptoms.

## 2020-02-28 NOTE — Progress Notes (Signed)
   Subjective:   Patient ID: [name], female    DOB: 08/29/1955, 64 y.o.   MRN: [id number]  HPI The patient is a 64 YO female coming in for follow up ER visit (for back and stomach pain, CT done without acute cause, treated for acute pyelonephritis, given 14 day course antibiotics which she is still taking). She has had multiple episodes of C dif in the past which she was concerned about having prior to UTI diagnosis. She has had stable symptoms since that time. She has also stopped lyrica around the time symptoms started. She denies fevers or chills. Denies burning with urination. Felt that there was an odor which has cleared. Mostly just still feeling tired and nauseous. Having 3 BM per day. Taking probiotic daily.   PMH, [facility], social history reviewed and updated  Review of Systems  Constitutional: Positive for appetite change and fatigue.  HENT: Negative.   Eyes: Negative.   Respiratory: Negative for cough, chest tightness and shortness of breath.   Cardiovascular: Negative for chest pain, palpitations and leg swelling.  Gastrointestinal: Positive for diarrhea and nausea. Negative for abdominal distention, abdominal pain, constipation and vomiting.  Musculoskeletal: Positive for back pain.  Skin: Negative.   Neurological: Negative.   Psychiatric/Behavioral: Negative.     Objective:  Physical Exam Constitutional:      Appearance: She is well-developed. She is obese.  HENT:     Head: Normocephalic and atraumatic.  Cardiovascular:     Rate and Rhythm: Normal rate and regular rhythm.  Pulmonary:     Effort: Pulmonary effort is normal. No respiratory distress.     Breath sounds: Normal breath sounds. No wheezing or rales.  Abdominal:     General: Bowel sounds are normal. There is no distension.     Palpations: Abdomen is soft.     Tenderness: There is no abdominal tenderness. There is no rebound.  Musculoskeletal:     Cervical back: Normal range of motion.  Skin:     General: Skin is warm and dry.  Neurological:     Mental Status: She is alert and oriented to person, place, and time.     Coordination: Coordination normal.     Vitals:   02/28/20 1022  BP: 140/88  Pulse: 82  Temp: 99.2 F (37.3 C)  TempSrc: Oral  SpO2: 95%  Weight: 252 lb (114.3 kg)  Height: 5\' 6"  (1.676 m)    This visit occurred during the SARS-CoV-2 public health emergency.  Safety protocols were in place, including screening questions prior to the visit, additional usage of staff PPE, and extensive cleaning of exam room while observing appropriate contact time as indicated for disinfecting solutions.   Assessment & Plan:

## 2020-02-29 LAB — URINE CULTURE: Result:: NO GROWTH

## 2020-03-08 ENCOUNTER — Other Ambulatory Visit: Payer: Self-pay | Admitting: Internal Medicine

## 2020-03-08 DIAGNOSIS — M797 Fibromyalgia: Secondary | ICD-10-CM

## 2020-03-10 ENCOUNTER — Encounter: Payer: Self-pay | Admitting: Internal Medicine

## 2020-03-11 ENCOUNTER — Ambulatory Visit: Payer: Medicare HMO | Admitting: Family Medicine

## 2020-03-12 ENCOUNTER — Encounter: Payer: Self-pay | Admitting: Family Medicine

## 2020-03-18 DIAGNOSIS — R195 Other fecal abnormalities: Secondary | ICD-10-CM | POA: Diagnosis not present

## 2020-03-18 DIAGNOSIS — R300 Dysuria: Secondary | ICD-10-CM | POA: Diagnosis not present

## 2020-03-18 DIAGNOSIS — R1013 Epigastric pain: Secondary | ICD-10-CM | POA: Diagnosis not present

## 2020-03-18 DIAGNOSIS — R1030 Lower abdominal pain, unspecified: Secondary | ICD-10-CM | POA: Diagnosis not present

## 2020-03-18 DIAGNOSIS — R112 Nausea with vomiting, unspecified: Secondary | ICD-10-CM | POA: Diagnosis not present

## 2020-03-18 NOTE — Telephone Encounter (Signed)
[place] Controlled Database Checked Last filled:   01/28/20  # 120 Rx'd by Dr. Ronnald Ramp LOV w/you:   02/28/20  Next appt w/you:  None

## 2020-03-19 DIAGNOSIS — R1030 Lower abdominal pain, unspecified: Secondary | ICD-10-CM | POA: Diagnosis not present

## 2020-03-19 DIAGNOSIS — R300 Dysuria: Secondary | ICD-10-CM | POA: Diagnosis not present

## 2020-03-19 DIAGNOSIS — R112 Nausea with vomiting, unspecified: Secondary | ICD-10-CM | POA: Diagnosis not present

## 2020-03-20 DIAGNOSIS — R112 Nausea with vomiting, unspecified: Secondary | ICD-10-CM | POA: Diagnosis not present

## 2020-03-20 DIAGNOSIS — R195 Other fecal abnormalities: Secondary | ICD-10-CM | POA: Diagnosis not present

## 2020-03-21 ENCOUNTER — Encounter: Payer: Self-pay | Admitting: Internal Medicine

## 2020-03-24 MED ORDER — NITROFURANTOIN MONOHYD MACRO 100 MG PO CAPS: 100.0000 mg | ORAL_CAPSULE | Freq: Two times a day (BID) | ORAL | 0 refills | Status: DC

## 2020-04-01 ENCOUNTER — Telehealth: Payer: Self-pay | Admitting: Pharmacist

## 2020-04-01 NOTE — Progress Notes (Signed)
Chronic Care Management Pharmacy Assistant   Name: [name]  MRN: [id number] DOB: November 19, 1955  Reason for Encounter: Initial Questions   PCP : Jessica Koch, MD  Allergies:   Allergies  Allergen Reactions  . Carafate [Sucralfate] Hives, Itching and Other (See Comments)    Angioedema     . Sulfonamide Derivatives Rash    Syncope    Medications: Outpatient Encounter Medications as of 04/01/2020  Medication Sig  . ACCU-CHEK SOFTCLIX LANCETS lancets CHECK BLOOD SUGARS TWICE DAILY  . albuterol (VENTOLIN HFA) 108 (90 Base) MCG/ACT inhaler INHALE 2 PUFFS INTO THE LUNGS EVERY 6 HOURS AS NEEDED FOR WHEEZING OR SHORTNESS OF BREATH  . Alcohol Swabs (B-D SINGLE USE SWABS REGULAR) PADS USE AS DIRECTED EVERY DAY  . amLODipine (NORVASC) 10 MG tablet TAKE 1 TABLET EVERY DAY.  ANNUAL APPOINTMENT DUE IN SEPT MUST SEE PROVIDER FOR FUTURE REFILLS.  Marland Kitchen BLACK COHOSH PO Take 1 tablet by mouth daily.  . Blood Glucose Calibration (ACCU-CHEK AVIVA) SOLN Use as directed  . Blood Glucose Monitoring Suppl (ACCU-CHEK AVIVA PLUS) w/Device KIT USE AS DIRECTED  TO CHECK BLOOD SUGAR EVERY DAY  . DULoxetine (CYMBALTA) 30 MG capsule TAKE 1 TO 2 CAPSULES EVERY DAY - ANNUAL APPOINTMENT DUE IN SEPT MUST SEE PROVIDER FOR FUTURE REFILLS.  . furosemide (LASIX) 40 MG tablet TAKE 1 TABLET DAILY AS NEEDED. ANNUAL APPOINTMENT DUE IN SEPT MUST SEE PROVIDER FOR FUTURE REFILLS.  Marland Kitchen glucose blood (ACCU-CHEK AVIVA PLUS) test strip 1 each by Other route 2 (two) times daily. Use to check blood sugars twice a day  . metoCLOPramide (REGLAN) 5 MG tablet Take 1 tablet (5 mg total) by mouth 3 (three) times daily before meals.  . metoprolol tartrate (LOPRESSOR) 50 MG tablet Take 2 tablets (100 mg total) by mouth once for 1 dose. TAKE ONE HOUR PRIOR TO  SCHEDULE CARDIAC TEST  . Multiple Vitamins-Minerals (MULTIVITAMINS THER. W/MINERALS) TABS Take 1 tablet by mouth daily.   . nitrofurantoin (MACRODANTIN) 100 MG capsule    . nitrofurantoin, macrocrystal-monohydrate, (MACROBID) 100 MG capsule Take 1 capsule (100 mg total) by mouth 2 (two) times daily.  . ondansetron (ZOFRAN ODT) 4 MG disintegrating tablet Take 1 tablet (4 mg total) by mouth every 8 (eight) hours as needed for nausea or vomiting.  . ondansetron (ZOFRAN) 4 MG tablet Take 1 tablet (4 mg total) by mouth every 8 (eight) hours as needed for nausea or vomiting.  . pantoprazole (PROTONIX) 40 MG tablet TAKE 1 TABLET(40 MG) BY MOUTH DAILY  . potassium chloride SA (KLOR-CON) 20 MEQ tablet TAKE 1 TABLET EVERY DAY  . traMADol (ULTRAM) 50 MG tablet TAKE 1 TABLET(50 MG) BY MOUTH FOUR TIMES DAILY  . traZODone (DESYREL) 50 MG tablet TAKE 1 TABLET AT BEDTIME AS NEEDED FOR SLEEP. ANNUAL APPT DUE IN SEPT MUST SEE PROVIDER FOR FUTURE REFILLS   No facility-administered encounter medications on file as of 04/01/2020.    Current Diagnosis: Patient Active Problem List   Diagnosis Date Noted  . Recurrent Clostridioides difficile infection 02/28/2020  . Pyelonephritis 02/28/2020  . Abnormal urine odor 08/10/2019  . Gastrointestinal complaint 07/05/2019  . Muscle strain of gluteal region, right, initial encounter 05/11/2019  . Dyspnea on exertion 03/13/2019  . Rapid heartbeat 03/13/2019  . Systolic ejection murmur 38/01/1750  . Leg swelling 10/09/2018  . Insomnia 05/19/2018  . Frequent refractory urinary tract infections 05/05/2018  . Right leg pain 12/16/2017  . History of deep vein thrombosis (DVT) of lower  extremity 12/12/2017  . Cavovarus deformity of foot, acquired, unspecified laterality 04/07/2017  . S/P gastric bypass 08/18/2016  . Muscle cramps 07/09/2016  . Adjustment disorder with mixed anxiety and depressed mood 03/05/2016  . Fatigue 03/05/2016  . Cervical disc disorder with radiculopathy of cervical region 02/27/2016  . Degenerative arthritis of right knee 02/27/2016  . Right knee pain 02/24/2016  . Left shoulder pain 02/24/2016  . Routine  general medical examination at a health care facility 12/12/2015  . Schatzki's ring   . Lumbar radiculopathy 03/05/2015  . Peripheral neuropathy 01/17/2015  . Chest pain with moderate risk for cardiac etiology 05/21/2013  . Fibromyalgia 02/21/2011  . Essential hypertension 12/04/2006  . Hx of diabetes mellitus 10/20/2006  . Morbid obesity ([place]) 10/20/2006  . MDD (major depressive disorder) ([place]) 10/20/2006  . OBSTRUCTIVE SLEEP APNEA 10/20/2006  . OSTEOARTHRITIS 10/20/2006    Goals Addressed   None     Follow-Up:  Pharmacist Review  Have you seen any other providers since your last visit? The patient states that she last saw Jessica Hawking  PA at [facility] on 03/20/20  Any changes in your medications or health? The patient states that the PA added promethazine and pantoprazole on 03/20/20   Any side effects from any medications? The patient states that she does get dizzy but not sure if it is from the medications or not.  Do you have an symptoms or problems not managed by your medications? The patient is having a lot of nausea and stomach issues in the last 3/4 weeks not sure if it is from the booster shot or medications  Any concerns about your health right now? The patient states that her concern is that she can't figure out what is going on with her stomach. She states that she is to have a colonoscopy and endoscopy in January  Has your provider asked that you check blood pressure, blood sugar, or follow special diet at home? The patient states that she does check her blood pressure at least 3 times a week and is has been low, did not give a reading  Do you get any type of exercise on a regular basis? The patient states that she was doing physical therapy 3 times a week and wants to start back  Can you think of a goal you would like to reach for your health?  The patient states that she would like to start back walking and doing water exercise  Do you have any problems getting  your medications? The patient states that she has no problems with getting her medications  Is there anything that you would like to discuss during the appointment? The patient would like to  discuss blood pressure meds and how she can come off some of her medications  Please bring medications and supplements to appointment   [name], [place]

## 2020-04-02 ENCOUNTER — Ambulatory Visit: Payer: Medicare HMO | Admitting: Pharmacist

## 2020-04-02 ENCOUNTER — Other Ambulatory Visit: Payer: Self-pay

## 2020-04-02 DIAGNOSIS — I10 Essential (primary) hypertension: Secondary | ICD-10-CM

## 2020-04-02 DIAGNOSIS — F4323 Adjustment disorder with mixed anxiety and depressed mood: Secondary | ICD-10-CM

## 2020-04-02 DIAGNOSIS — Z8639 Personal history of other endocrine, nutritional and metabolic disease: Secondary | ICD-10-CM

## 2020-04-02 DIAGNOSIS — F331 Major depressive disorder, recurrent, moderate: Secondary | ICD-10-CM

## 2020-04-02 NOTE — Chronic Care Management (AMB) (Signed)
Chronic Care Management Pharmacy  Name: [name]  MRN: [id number] DOB: October 15, 1955   Chief Complaint/ HPI  [name],  64 y.o. , female presents for her Initial CCM visit with the clinical pharmacist via telephone due to COVID-19 Pandemic.  PCP : Hoyt Koch, MD Patient Care Team: Hoyt Koch, MD as PCP - General (Internal Medicine) Charlton Haws, [facility] as Pharmacist (Pharmacist)  Patient's chronic conditions include: Hypertension, Depression, Anxiety and Osteoarthritis, OSA, peripheral neuropathy, recurrent C. Diff (last long taper vanco ended 12/2019), fibromyalgia, hx gastric bypass (2004), hx DVT (remote)  Office Visits: 02/28/20 Dr Sharlet Salina OV: ER f/u, treated pyelonephritis. Unclear dx and hx recurrent C diff so stopped antibiotic.  Consult Visit: 03[facility]/2020 [facility] ordered UA, Cdiff EIA. D/C all NSAIDS, ASA. Increase pantoprazole to 40 mg BID. Liquid antacid PRN. Reschedule EGD.   Subjective: Patient lives alone and handles her own medications. She had gastric bypass in 2004 that "reversed" a lot of her medical problems, but she has gained a significant amount of weight back since then.  Objective: Allergies  Allergen Reactions  . Carafate [Sucralfate] Hives, Itching and Other (See Comments)    Angioedema     . Sulfonamide Derivatives Rash    Syncope    Medications: Outpatient Encounter Medications as of 04/02/2020  Medication Sig  . ACCU-CHEK SOFTCLIX LANCETS lancets CHECK BLOOD SUGARS TWICE DAILY  . albuterol (VENTOLIN HFA) 108 (90 Base) MCG/ACT inhaler INHALE 2 PUFFS INTO THE LUNGS EVERY 6 HOURS AS NEEDED FOR WHEEZING OR SHORTNESS OF BREATH  . Alcohol Swabs (B-D SINGLE USE SWABS REGULAR) PADS USE AS DIRECTED EVERY DAY  . amLODipine (NORVASC) 10 MG tablet TAKE 1 TABLET EVERY DAY.  ANNUAL APPOINTMENT DUE IN SEPT MUST SEE PROVIDER FOR FUTURE REFILLS.  Marland Kitchen Ascorbic Acid (VITAMIN C) 1000 MG tablet  Take 1,000 mg by mouth daily.  Marland Kitchen BLACK COHOSH PO Take 1 tablet by mouth daily.  . Blood Glucose Calibration (ACCU-CHEK AVIVA) SOLN Use as directed  . Blood Glucose Monitoring Suppl (ACCU-CHEK AVIVA PLUS) w/Device KIT USE AS DIRECTED  TO CHECK BLOOD SUGAR EVERY DAY  . Calcium Citrate-Vitamin D (CALCIUM CITRATE + D3) 315-250 MG-UNIT TABS Take 1 tablet by mouth daily.  . cetirizine (ZYRTEC) 10 MG tablet Take 10 mg by mouth daily.  . Cranberry 250 MG CAPS Take 1 capsule by mouth daily.  . Cyanocobalamin (VITAMIN B-12) 1000 MCG/15ML LIQD Take by mouth.  . DULoxetine (CYMBALTA) 30 MG capsule TAKE 1 TO 2 CAPSULES EVERY DAY - ANNUAL APPOINTMENT DUE IN SEPT MUST SEE PROVIDER FOR FUTURE REFILLS. (Patient taking differently: Take 60 mg by mouth daily.)  . ferrous sulfate 325 (65 FE) MG tablet Take 325 mg by mouth daily with breakfast.  . furosemide (LASIX) 40 MG tablet TAKE 1 TABLET DAILY AS NEEDED. ANNUAL APPOINTMENT DUE IN SEPT MUST SEE PROVIDER FOR FUTURE REFILLS.  Marland Kitchen glucose blood (ACCU-CHEK AVIVA PLUS) test strip 1 each by Other route 2 (two) times daily. Use to check blood sugars twice a day  . Multiple Vitamins-Minerals (MULTIVITAMINS THER. W/MINERALS) TABS Take 1 tablet by mouth daily.  . pantoprazole (PROTONIX) 40 MG tablet TAKE 1 TABLET(40 MG) BY MOUTH DAILY (Patient taking differently: Take 40 mg by mouth 2 (two) times daily.)  . potassium chloride SA (KLOR-CON) 20 MEQ tablet TAKE 1 TABLET EVERY DAY  . traMADol (ULTRAM) 50 MG tablet TAKE 1 TABLET(50 MG) BY MOUTH FOUR TIMES DAILY  . traZODone (DESYREL) 50  MG tablet TAKE 1 TABLET AT BEDTIME AS NEEDED FOR SLEEP. ANNUAL APPT DUE IN SEPT MUST SEE PROVIDER FOR FUTURE REFILLS  . zinc gluconate 50 MG tablet Take 50 mg by mouth daily.  . [DISCONTINUED] nitrofurantoin (MACRODANTIN) 100 MG capsule   . [DISCONTINUED] nitrofurantoin, macrocrystal-monohydrate, (MACROBID) 100 MG capsule Take 1 capsule (100 mg total) by mouth 2 (two) times daily.  . ondansetron  (ZOFRAN ODT) 4 MG disintegrating tablet Take 1 tablet (4 mg total) by mouth every 8 (eight) hours as needed for nausea or vomiting. (Patient not taking: Reported on 04/02/2020)  . [DISCONTINUED] metoCLOPramide (REGLAN) 5 MG tablet Take 1 tablet (5 mg total) by mouth 3 (three) times daily before meals. (Patient not taking: Reported on 04/02/2020)  . [DISCONTINUED] metoprolol tartrate (LOPRESSOR) 50 MG tablet Take 2 tablets (100 mg total) by mouth once for 1 dose. TAKE ONE HOUR PRIOR TO  SCHEDULE CARDIAC TEST  . [DISCONTINUED] ondansetron (ZOFRAN) 4 MG tablet Take 1 tablet (4 mg total) by mouth every 8 (eight) hours as needed for nausea or vomiting. (Patient not taking: Reported on 04/02/2020)   No facility-administered encounter medications on file as of 04/02/2020.    Wt Readings from Last 3 Encounters:  02/28/20 252 lb (114.3 kg)  02/06/20 273 lb (123.8 kg)  10/15/19 265 lb (120.2 kg)   Lab Results  Component Value Date   CREATININE 0.83 02/22/2020   BUN 14 02/22/2020   GFR 80.97 10/11/2019   GFRNONAA >60 02/22/2020   GFRAA 95 05/01/2019   NA 144 02/22/2020   K 4.1 02/22/2020   CALCIUM 8.4 (L) 02/22/2020   CO2 22 02/22/2020    Current Diagnosis/Assessment:  SDOH Interventions   Flowsheet Row Most Recent Value  SDOH Interventions   Financial Strain Interventions Intervention Not Indicated      Goals Addressed            This Visit's Progress   . Pharmacy Care Plan       CARE PLAN ENTRY (see longitudinal plan of care for additional care plan information)  Current Barriers:  . Chronic Disease Management support, education, and care coordination needs related to Hypertension, Depression, Anxiety, and Prediabetes   Hypertension BP Readings from Last 3 Encounters:  02/28/20 140/88  02/22/20 (!) 149/82  02/06/20 122/86 .  Pharmacist Clinical Goal(s): o Over the next 60 days, patient will work with PharmD and providers to maintain BP goal <130/80 . Current regimen:   o Amlodipine 10 mg daily . Interventions: o Discussed BP goals and benefits of medications for prevention of heart attack / stroke . Patient self care activities - Over the next 60 days, patient will: o Check BP 2-3 times weekly, document, and provide at future appointments o Ensure daily salt intake < 2300 mg/day  Prediabetes Lab Results  Component Value Date/Time   HGBA1C 5.5 01/02/2019 02:03 PM   HGBA1C 5.2 12/11/2015 02:23 PM .  Pharmacist Clinical Goal(s): o Over the next 60 days, patient will work with PharmD and providers to maintain A1c goal <6.5% . Current regimen:  o Testing supplies . Interventions: o Discussed importance of maintaining sugars at goal to prevent complications of diabetes including kidney damage, retinal damage, and cardiovascular disease o Advised patient can stop checking BG at home if she wishes since we are monitoring A1c periodically . Patient self care activities - Over the next 60 days, patient will: o Continue diet and exercise routine  Depression / Anxiety . Pharmacist Clinical Goal(s) o Over the next  60 days, patient will work with PharmD and providers to optimize therapy . Current regimen:  o Duloxetine 60 mg daily o Trazodone 50 mg at bedtime . Interventions: o Discussed benefits of dual therapy with bupropion + duloxetine to manage depression o Recommend addition of bupropion XL 150 mg once daily . Patient self care activities - Over the next 60 days, patient will: o Take medications as prescribed  Medication management . Pharmacist Clinical Goal(s): o Over the next 60 days, patient will work with PharmD and providers to maintain optimal medication adherence . Current pharmacy: [facility] mail order . Interventions o Comprehensive medication review performed. o Continue current medication management strategy . Patient self care activities - Over the next 60 days, patient will: o Focus on medication adherence by pill box o Take  medications as prescribed o Report any questions or concerns to PharmD and/or provider(s)  Initial goal documentation       Hypertension   BP goal is:  <130/80  Office blood pressures are  BP Readings from Last 3 Encounters:  02/28/20 140/88  02/22/20 (!) 149/82  02/06/20 122/86   Patient checks BP at home 1-2x per week Patient home BP readings are ranging: 117/80-120/80s  Patient has failed these meds in the past: metoprolol, lisinopril-HCTZ Patient is currently controlled on the following medications:  . Amlodipine 10 mg daily  . Furosemide 40 mg daily PRN - not taking . Klor-Con 20 mEq daily  We discussed: BP goals; benefits of medications; pt does not take furosemide daily anymore, and has not taken in several months -pt does take KCl even when not taking Lasix due to hx of Gastric bypass, trouble with maintaining K levels. Mainly taking for leg cramps now. Last few potassium levels were WNL so advised pt to continue the same regimen  Plan  Continue current medications   Prediabetes   A1c goal <6.5%  Recent Relevant Labs: Lab Results  Component Value Date/Time   HGBA1C 5.5 01/02/2019 02:03 PM   HGBA1C 5.2 12/11/2015 02:23 PM   GFR 80.97 10/11/2019 03:26 PM   GFR 74.88 08/09/2019 03:48 PM   MICROALBUR <0.7 01/02/2019 02:03 PM    Last diabetic Eye exam:  Lab Results  Component Value Date/Time   HMDIABEYEEXA No Retinopathy 12/09/2015 12:00 AM    Last diabetic Foot exam: No results found for: HMDIABFOOTEX   Checking BG: 2x Weekly  Patient has failed these meds in past: n/a Patient is currently controlled on the following medications: . Testing supplies  We discussed: diet and exercise extensively  Pt was diabetic prior to gastric bypass and took metformin, after bypass she was "cured" of diabetes; she still checks BG a few days per week to monitor, BG is < 130; discussed she does not need to monitor herself anymore since PCP is monitoring  A1c  Plan  Continue control with diet and exercise  May stop checking BG at home if desired  Depression / Anxiety   Depression screen [facility] 2/9 02/28/2020 10/26/2018 05/19/2018  Decreased Interest _0 Down, Depressed, Hopeless _1 PHQ - 2 Score _2 Altered sleeping _3 Tired, decreased energy _4 Change in appetite _5 Feeling bad or failure about yourself  0 1 2  Trouble concentrating 0 1 2  Moving slowly or fidgety/restless 0 0 1  Suicidal thoughts 0 0 0  PHQ-9 Score _6 Difficult doing work/chores Somewhat difficult - -  Some recent data might be hidden   Patient has failed these meds in past: fluoxetine, amitriptyline, bupropion, citalopram, escitalopram, venlafaxine  Patient is currently uncontrolled on the following medications:  . Duloxetine 30 mg 1-2 cap daily AM . Trazodone 50 mg HS prn  We discussed:  Pt feels duloxetine is "wearing off" and not as effective as it used to be. Years ago, pt was taking bupropion while she was taking care of her sick mother, she felt it wasn't enough so it was switched to duloxetine.  -Discussed benefits of dual therapy with bupropion and duloxetine, pt would like to try. Discussed optimal timing of medication - bupropion in AM preferred due to potential to cause insomnia   Plan  Recommend adding bupropion XL 150 mg daily to current regimen  GERD / Nausea   Patient has failed these meds in past: metoclopramide, promethazine Patient is currently controlled on the following medications:  . Pantoprazole 40 mg BID . Ondansetron ODT 4 mg PRN  We discussed:  PPI twice a day has really helped nausea/stomach issues lately. Has not needed anti emetics over last few days, overall pt feeling much better.  Plan  Continue current medications  Chronic Pain   Fibromyalgia Peripheral neuropathy Lumbar radiculopathy Cervical disc disorder Osteoarthritis Degenerative arthritis of R knee  Patient has failed these meds  in past: Lyrica Patient is currently controlled on the following medications:  . Tramadol 50 mg QID . Duloxetine 30 mg 2 cap daily  We discussed: Patient is satisfied with current regimen and denies issues  Plan  Continue current medications  Recurrent UTI/C.diff   Hx recurrent UTI and C.diff [facility] labs 03/20/2020: -Cdiff Antigen: positive -Cdiff toxin: negative -Interpretation: consistent with C diff colonization. No indication to treat, will not prevent subsequent infection. Avoid antibiotics and PPIs.  Patient has failed these meds in past: n/a Patient is currently controlled on the following medications:  . No medications  We discussed:  Pt reports symptoms have resolved and she is feeling much better after completed last course of Arp to monitor  Vaccines   Reviewed and discussed patient's vaccination history.    Immunization History  Administered Date(s) Administered  . Influenza Whole 02/01/2007  . Influenza,inj,Quad PF,6+ Mos 12/16/2017  . Influenza-Unspecified 02/19/2013, 05/20/2014, 02/01/2016, 12/17/2016, 02/19/2020  . Moderna Sars-Covid-2 Vaccination 05/13/2019, 06/10/2019, 02/19/2020  . Pneumococcal Conjugate-13 01/02/2019  . Td 11/12/1997  . Tdap 08/07/2014    Plan  Recommended patient receive PPSV 23 vaccine in office and 2nd Shingrix vaccine at pharmacy   Health Maintenance   Patient is currently controlled on the following medications:  Marland Kitchen Multivitamin . Albuterol HFA prn - allergies . Black cohosh . Calcium citrate-D . Vitamin C . Cranberry 250 mg . Vitamin B12 SL drops  . Zinc 50 mg daily . Cetirizine 10 mg daily . Ferrous sulfate 325 mg daily  We discussed:  Patient is satisfied with current regimen and denies issues   Plan  Continue current medications  Medication Management   Patient's preferred pharmacy is:  [facility] [place], [address] AT [address] [place] [address] Phone: [phone] Fax: [phone]  [facility] [address], [address] [place] [address] Phone: [phone] Fax: [phone]  Uses pill box? Yes Pt endorses 100% compliance  We discussed: Current pharmacy is preferred with insurance plan and  patient is satisfied with pharmacy services  Plan  Continue current medication management strategy    Follow up: 2 month phone visit  Charlene Brooke, PharmD, [facility] Clinical Pharmacist [facility] [phone]

## 2020-04-03 NOTE — Patient Instructions (Addendum)
Visit Information  Phone number for Pharmacist: [phone]  Thank you for meeting with me to discuss your medications! I look forward to working with you to achieve your health care goals. Below is a summary of what we talked about during the visit:  Goals Addressed            This Visit's Progress   . Pharmacy Care Plan       CARE PLAN ENTRY (see longitudinal plan of care for additional care plan information)  Current Barriers:  . Chronic Disease Management support, education, and care coordination needs related to Hypertension, Depression, Anxiety, and Prediabetes   Hypertension BP Readings from Last 3 Encounters:  02/28/20 140/88  02/22/20 (!) 149/82  02/06/20 122/86 .  Pharmacist Clinical Goal(s): o Over the next 60 days, patient will work with PharmD and providers to maintain BP goal <130/80 . Current regimen:  o Amlodipine 10 mg daily . Interventions: o Discussed BP goals and benefits of medications for prevention of heart attack / stroke . Patient self care activities - Over the next 60 days, patient will: o Check BP 2-3 times weekly, document, and provide at future appointments o Ensure daily salt intake < 2300 mg/day  Prediabetes Lab Results  Component Value Date/Time   HGBA1C 5.5 01/02/2019 02:03 PM   HGBA1C 5.2 12/11/2015 02:23 PM .  Pharmacist Clinical Goal(s): o Over the next 60 days, patient will work with PharmD and providers to maintain A1c goal <6.5% . Current regimen:  o Testing supplies . Interventions: o Discussed importance of maintaining sugars at goal to prevent complications of diabetes including kidney damage, retinal damage, and cardiovascular disease o Advised patient can stop checking BG at home if she wishes since we are monitoring A1c periodically . Patient self care activities - Over the next 60 days, patient will: o Continue diet and exercise routine  Depression / Anxiety . Pharmacist Clinical Goal(s) o Over the next 60 days,  patient will work with PharmD and providers to optimize therapy . Current regimen:  o Duloxetine 60 mg daily o Trazodone 50 mg at bedtime . Interventions: o Discussed benefits of dual therapy with bupropion + duloxetine to manage depression o Recommend addition of bupropion XL 150 mg once daily . Patient self care activities - Over the next 60 days, patient will: o Take medications as prescribed  Medication management . Pharmacist Clinical Goal(s): o Over the next 60 days, patient will work with PharmD and providers to maintain optimal medication adherence . Current pharmacy: [facility] mail order . Interventions o Comprehensive medication review performed. o Continue current medication management strategy . Patient self care activities - Over the next 60 days, patient will: o Focus on medication adherence by pill box o Take medications as prescribed o Report any questions or concerns to PharmD and/or provider(s)  Initial goal documentation      [name] was given information about Chronic Care Management services today including:  1. CCM service includes personalized support from designated clinical staff supervised by her physician, including individualized plan of care and coordination with other care providers 2. 24/7 contact phone numbers for assistance for urgent and routine care needs. 3. Standard insurance, coinsurance, copays and deductibles apply for chronic care management only during months in which we provide at least 20 minutes of these services. Most insurances cover these services at 100%, however patients may be responsible for any copay, coinsurance and/or deductible if applicable. This service may help you avoid the need for more expensive  face-to-face services. 4. Only one practitioner may furnish and bill the service in a calendar month. 5. The patient may stop CCM services at any time (effective at the end of the month) by phone call to the office staff.  Patient  agreed to services and verbal consent obtained.   The patient verbalized understanding of instructions, educational materials, and care plan provided today and agreed to receive a mailed copy of patient instructions, educational materials, and care plan.  Telephone follow up appointment with pharmacy team member scheduled for: 2 months  Charlene Brooke, PharmD, BCACP Clinical Pharmacist [facility] [phone]  Heart-Healthy Eating Plan Many factors influence your heart (coronary) health, including eating and exercise habits. Coronary risk increases with abnormal blood fat (lipid) levels. Heart-healthy meal planning includes limiting unhealthy fats, increasing healthy fats, and making other diet and lifestyle changes. What is my plan? Your health care provider may recommend that you:  Limit your fat intake to _________% or less of your total calories each day.  Limit your saturated fat intake to _________% or less of your total calories each day.  Limit the amount of cholesterol in your diet to less than _________ mg per day. What are tips for following this plan? Cooking Cook foods using methods other than frying. Baking, boiling, grilling, and broiling are all good options. Other ways to reduce fat include:  Removing the skin from poultry.  Removing all visible fats from meats.  Steaming vegetables in water or broth. Meal planning   At meals, imagine dividing your plate into fourths: ? Fill one-half of your plate with vegetables and green salads. ? Fill one-fourth of your plate with whole grains. ? Fill one-fourth of your plate with lean protein foods.  Eat 4-5 servings of vegetables per day. One serving equals 1 cup raw or cooked vegetable, or 2 cups raw leafy greens.  Eat 4-5 servings of fruit per day. One serving equals 1 medium whole fruit,  cup dried fruit,  cup fresh, frozen, or canned fruit, or  cup 100% fruit juice.  Eat more foods  that contain soluble fiber. Examples include apples, broccoli, carrots, beans, peas, and barley. Aim to get 25-30 g of fiber per day.  Increase your consumption of legumes, nuts, and seeds to 4-5 servings per week. One serving of dried beans or legumes equals  cup cooked, 1 serving of nuts is  cup, and 1 serving of seeds equals 1 tablespoon. Fats  Choose healthy fats more often. Choose monounsaturated and polyunsaturated fats, such as olive and canola oils, flaxseeds, walnuts, almonds, and seeds.  Eat more omega-3 fats. Choose salmon, mackerel, sardines, tuna, flaxseed oil, and ground flaxseeds. Aim to eat fish at least 2 times each week.  Check food labels carefully to identify foods with trans fats or high amounts of saturated fat.  Limit saturated fats. These are found in animal products, such as meats, butter, and cream. Plant sources of saturated fats include palm oil, palm kernel oil, and coconut oil.  Avoid foods with partially hydrogenated oils in them. These contain trans fats. Examples are stick margarine, some tub margarines, cookies, crackers, and other baked goods.  Avoid fried foods. General information  Eat more home-cooked food and less restaurant, buffet, and fast food.  Limit or avoid alcohol.  Limit foods that are high in starch and sugar.  Lose weight if you are overweight. Losing just 5-10% of your body weight can help your overall health and prevent diseases such as  diabetes and heart disease.  Monitor your salt (sodium) intake, especially if you have high blood pressure. Talk with your health care provider about your sodium intake.  Try to incorporate more vegetarian meals weekly. What foods can I eat? Fruits All fresh, canned (in natural juice), or frozen fruits. Vegetables Fresh or frozen vegetables (raw, steamed, roasted, or grilled). Green salads. Grains Most grains. Choose whole wheat and whole grains most of the time. Rice and pasta, including brown  rice and pastas made with whole wheat. Meats and other proteins Lean, well-trimmed beef, veal, pork, and lamb. Chicken and [place] without skin. All fish and shellfish. Wild duck, rabbit, pheasant, and venison. Egg whites or low-cholesterol egg substitutes. Dried beans, peas, lentils, and tofu. Seeds and most nuts. Dairy Low-fat or nonfat cheeses, including ricotta and mozzarella. Skim or 1% milk (liquid, powdered, or evaporated). Buttermilk made with low-fat milk. Nonfat or low-fat yogurt. Fats and oils Non-hydrogenated (trans-free) margarines. Vegetable oils, including soybean, sesame, sunflower, olive, peanut, safflower, corn, canola, and cottonseed. Salad dressings or mayonnaise made with a vegetable oil. Beverages Water (mineral or sparkling). Coffee and tea. Diet carbonated beverages. Sweets and desserts Sherbet, gelatin, and fruit ice. Small amounts of dark chocolate. Limit all sweets and desserts. Seasonings and condiments All seasonings and condiments. The items listed above may not be a complete list of foods and beverages you can eat. Contact a dietitian for more options. What foods are not recommended? Fruits Canned fruit in heavy syrup. Fruit in cream or butter sauce. Fried fruit. Limit coconut. Vegetables Vegetables cooked in cheese, cream, or butter sauce. Fried vegetables. Grains Breads made with saturated or trans fats, oils, or whole milk. Croissants. Sweet rolls. Donuts. High-fat crackers, such as cheese crackers. Meats and other proteins Fatty meats, such as hot dogs, ribs, sausage, bacon, rib-eye roast or steak. High-fat deli meats, such as salami and bologna. Caviar. Domestic duck and goose. Organ meats, such as liver. Dairy Cream, sour cream, cream cheese, and creamed cottage cheese. Whole milk cheeses. Whole or 2% milk (liquid, evaporated, or condensed). Whole buttermilk. Cream sauce or high-fat cheese sauce. Whole-milk yogurt. Fats and oils Meat fat, or shortening.  Cocoa butter, hydrogenated oils, palm oil, coconut oil, palm kernel oil. Solid fats and shortenings, including bacon fat, salt pork, lard, and butter. Nondairy cream substitutes. Salad dressings with cheese or sour cream. Beverages Regular sodas and any drinks with added sugar. Sweets and desserts Frosting. Pudding. Cookies. Cakes. Pies. Milk chocolate or white chocolate. Buttered syrups. Full-fat ice cream or ice cream drinks. The items listed above may not be a complete list of foods and beverages to avoid. Contact a dietitian for more information. Summary  Heart-healthy meal planning includes limiting unhealthy fats, increasing healthy fats, and making other diet and lifestyle changes.  Lose weight if you are overweight. Losing just 5-10% of your body weight can help your overall health and prevent diseases such as diabetes and heart disease.  Focus on eating a balance of foods, including fruits and vegetables, low-fat or nonfat dairy, lean protein, nuts and legumes, whole grains, and heart-healthy oils and fats. This information is not intended to replace advice given to you by your health care provider. Make sure you discuss any questions you have with your health care provider. Document Revised: 05/13/2017 Document Reviewed: 05/13/2017 Elsevier Patient Education  2020 [employer].

## 2020-04-04 NOTE — Addendum Note (Signed)
Addended by: Cresenciano Lick on: 04/04/2020 12:14 PM   Modules accepted: Orders

## 2020-04-08 ENCOUNTER — Ambulatory Visit: Payer: Medicare HMO | Admitting: Family Medicine

## 2020-04-10 ENCOUNTER — Other Ambulatory Visit: Payer: Medicare HMO

## 2020-04-14 DIAGNOSIS — M5451 Vertebrogenic low back pain: Secondary | ICD-10-CM | POA: Diagnosis not present

## 2020-04-14 DIAGNOSIS — R262 Difficulty in walking, not elsewhere classified: Secondary | ICD-10-CM | POA: Diagnosis not present

## 2020-04-14 DIAGNOSIS — M545 Low back pain, unspecified: Secondary | ICD-10-CM | POA: Diagnosis not present

## 2020-04-14 DIAGNOSIS — M6281 Muscle weakness (generalized): Secondary | ICD-10-CM | POA: Diagnosis not present

## 2020-04-14 DIAGNOSIS — R2681 Unsteadiness on feet: Secondary | ICD-10-CM | POA: Diagnosis not present

## 2020-04-15 ENCOUNTER — Other Ambulatory Visit: Payer: Self-pay | Admitting: Internal Medicine

## 2020-04-15 ENCOUNTER — Other Ambulatory Visit: Payer: Self-pay | Admitting: Family Medicine

## 2020-04-15 ENCOUNTER — Ambulatory Visit
Admission: RE | Admit: 2020-04-15 | Discharge: 2020-04-15 | Disposition: A | Discharge status: Home / Self Care | Payer: Medicare HMO | Source: Ambulatory Visit | Attending: Family Medicine | Admitting: Family Medicine

## 2020-04-15 DIAGNOSIS — M5116 Intervertebral disc disorders with radiculopathy, lumbar region: Secondary | ICD-10-CM | POA: Diagnosis not present

## 2020-04-15 DIAGNOSIS — M47817 Spondylosis without myelopathy or radiculopathy, lumbosacral region: Secondary | ICD-10-CM | POA: Diagnosis not present

## 2020-04-15 DIAGNOSIS — M5416 Radiculopathy, lumbar region: Secondary | ICD-10-CM

## 2020-04-15 MED ORDER — IOPAMIDOL (ISOVUE-M 200) INJECTION 41%
1.0000 mL | Freq: Once | INTRAMUSCULAR | Status: AC
  Administered 2020-04-15: 1 mL via EPIDURAL

## 2020-04-15 MED ORDER — METHYLPREDNISOLONE ACETATE 40 MG/ML INJ SUSP (RADIOLOG
120.0000 mg | Freq: Once | INTRAMUSCULAR | Status: AC
  Administered 2020-04-15: 120 mg via EPIDURAL

## 2020-04-15 NOTE — Discharge Instructions (Signed)

## 2020-04-17 DIAGNOSIS — M545 Low back pain, unspecified: Secondary | ICD-10-CM | POA: Diagnosis not present

## 2020-04-17 DIAGNOSIS — R2681 Unsteadiness on feet: Secondary | ICD-10-CM | POA: Diagnosis not present

## 2020-04-17 DIAGNOSIS — M6281 Muscle weakness (generalized): Secondary | ICD-10-CM | POA: Diagnosis not present

## 2020-04-17 DIAGNOSIS — R262 Difficulty in walking, not elsewhere classified: Secondary | ICD-10-CM | POA: Diagnosis not present

## 2020-04-21 NOTE — Progress Notes (Signed)
Patient would like to try higher dose of duloxetine. She is currently taking 30 mg x 2 capsules every day, so I advised she increase to 3 capsules daily. She would like updated Rx sent to [facility] mail order pharmacy.

## 2020-04-22 ENCOUNTER — Other Ambulatory Visit: Payer: Self-pay | Admitting: Internal Medicine

## 2020-04-22 DIAGNOSIS — R112 Nausea with vomiting, unspecified: Secondary | ICD-10-CM | POA: Diagnosis not present

## 2020-04-22 DIAGNOSIS — R109 Unspecified abdominal pain: Secondary | ICD-10-CM | POA: Diagnosis not present

## 2020-04-22 DIAGNOSIS — R110 Nausea: Secondary | ICD-10-CM | POA: Diagnosis not present

## 2020-04-22 DIAGNOSIS — Z980 Intestinal bypass and anastomosis status: Secondary | ICD-10-CM | POA: Diagnosis not present

## 2020-04-22 DIAGNOSIS — Z1211 Encounter for screening for malignant neoplasm of colon: Secondary | ICD-10-CM | POA: Diagnosis not present

## 2020-04-22 DIAGNOSIS — Z5309 Procedure and treatment not carried out because of other contraindication: Secondary | ICD-10-CM | POA: Diagnosis not present

## 2020-04-22 DIAGNOSIS — K449 Diaphragmatic hernia without obstruction or gangrene: Secondary | ICD-10-CM | POA: Diagnosis not present

## 2020-04-22 MED ORDER — DULOXETINE HCL 30 MG PO CPEP
90.0000 mg | ORAL_CAPSULE | Freq: Every day | ORAL | 1 refills | Status: DC
Start: 2020-04-22 — End: 2020-12-02

## 2020-04-22 NOTE — Progress Notes (Signed)
Sent in updated rx thanks.

## 2020-04-28 DIAGNOSIS — R194 Change in bowel habit: Secondary | ICD-10-CM | POA: Diagnosis not present

## 2020-04-28 DIAGNOSIS — R110 Nausea: Secondary | ICD-10-CM | POA: Diagnosis not present

## 2020-04-30 ENCOUNTER — Ambulatory Visit
Admission: EM | Admit: 2020-04-30 | Discharge: 2020-04-30 | Disposition: A | Discharge status: Home / Self Care | Payer: Medicare PPO | Attending: Emergency Medicine | Admitting: Emergency Medicine

## 2020-04-30 ENCOUNTER — Other Ambulatory Visit: Payer: Self-pay

## 2020-04-30 ENCOUNTER — Ambulatory Visit (INDEPENDENT_AMBULATORY_CARE_PROVIDER_SITE_OTHER): Payer: Medicare PPO

## 2020-04-30 ENCOUNTER — Ambulatory Visit
Admission: EM | Admit: 2020-04-30 | Discharge: 2020-04-30 | Disposition: A | Discharge status: Home / Self Care | Payer: Medicare PPO

## 2020-04-30 DIAGNOSIS — M79622 Pain in left upper arm: Secondary | ICD-10-CM | POA: Diagnosis not present

## 2020-04-30 DIAGNOSIS — M419 Scoliosis, unspecified: Secondary | ICD-10-CM | POA: Diagnosis not present

## 2020-04-30 DIAGNOSIS — M79602 Pain in left arm: Secondary | ICD-10-CM | POA: Diagnosis not present

## 2020-04-30 DIAGNOSIS — S52502A Unspecified fracture of the lower end of left radius, initial encounter for closed fracture: Secondary | ICD-10-CM

## 2020-04-30 DIAGNOSIS — W19XXXA Unspecified fall, initial encounter: Secondary | ICD-10-CM

## 2020-04-30 DIAGNOSIS — R600 Localized edema: Secondary | ICD-10-CM | POA: Diagnosis not present

## 2020-04-30 DIAGNOSIS — S59292A Other physeal fracture of lower end of radius, left arm, initial encounter for closed fracture: Secondary | ICD-10-CM | POA: Diagnosis not present

## 2020-04-30 DIAGNOSIS — M25522 Pain in left elbow: Secondary | ICD-10-CM | POA: Diagnosis not present

## 2020-04-30 DIAGNOSIS — M19012 Primary osteoarthritis, left shoulder: Secondary | ICD-10-CM | POA: Diagnosis not present

## 2020-04-30 DIAGNOSIS — S52592A Other fractures of lower end of left radius, initial encounter for closed fracture: Secondary | ICD-10-CM | POA: Diagnosis not present

## 2020-04-30 NOTE — Discharge Instructions (Signed)
Distal radius fracture Call ortho in the morning for follow up  Tylenol 1000 mg every 4-6 hours Tramadol for severe pain Follow up if developing any increased pani or concerns about splint for reapplication

## 2020-04-30 NOTE — ED Triage Notes (Signed)
Pt states fell down 3 concrete steps today around 5p. C/o lt shoulder/arm/wrist/hand pain. Lt wrist deformity noted with bruising and swelling, ice pack applied

## 2020-05-01 ENCOUNTER — Ambulatory Visit: Payer: Medicare PPO | Admitting: Family Medicine

## 2020-05-01 ENCOUNTER — Encounter: Payer: Self-pay | Admitting: Family Medicine

## 2020-05-01 VITALS — BP 130/74 | HR 72 | Ht 66.0 in | Wt 235.4 lb

## 2020-05-01 DIAGNOSIS — S52552A Other extraarticular fracture of lower end of left radius, initial encounter for closed fracture: Secondary | ICD-10-CM

## 2020-05-01 DIAGNOSIS — S52502A Unspecified fracture of the lower end of left radius, initial encounter for closed fracture: Secondary | ICD-10-CM | POA: Insufficient documentation

## 2020-05-01 MED ORDER — HYDROCODONE-ACETAMINOPHEN 5-325 MG PO TABS: 1.0000 | ORAL_TABLET | Freq: Four times a day (QID) | ORAL | 0 refills | Status: DC | PRN

## 2020-05-01 NOTE — Progress Notes (Signed)
I, Wendy Poet, LAT, ATC, am serving as scribe for Dr. Lynne Leader.  [name] is a 65 y.o. female who presents to [place] at [facility] today for f/u of L distal radius fracture that was diagnosed yesterday at [facility].  Pt fell down a few stairs yesterday (04/30/20) and landed on her L arm.  She was seen at [facility] as above and placed in a sugar-tong splint and advised to use a combination of Tramadol and Tylenol for pain.  Since then, pt reports that she is in a good bit of pain despite taking the Tramadol and the Tylenol.  She locates the majority of her pain to the ulnar side of her wrist w/ pain radiating up into the ulnar side of her forearm.  She has been getting some paresthesias in her L fingers and some swelling in her L hand and fingers.  Diagnostic testing: L wrist, elbow and shoulder XR- 04/30/20   Pertinent review of systems: No fevers or chills  Relevant historical information: Hypertension, sleep apnea   Exam:  BP 130/74 (BP Location: Right Arm, Patient Position: Sitting, Cuff Size: Large)   Pulse 72   Ht 5\' 6"  (1.676 m)   Wt 235 lb 6.4 oz (106.8 kg)   SpO2 96%   BMI 37.99 kg/m  General: Well Developed, well nourished, and in no acute distress.   MSK: Left arm in sugar-tong splint.  Finger strength and sensation are intact distally.  Plate refill is intact distally.  Splint was not removed.   Lab and Radiology Results No results found for this or any previous visit (from the past 72 hour(s)). DG Elbow Complete Left  Result Date: 04/30/2020 CLINICAL DATA:  Fall onto left arm with left upper extremity pain. EXAM: LEFT ELBOW - COMPLETE 3+ VIEW COMPARISON:  None. FINDINGS: There is no evidence of fracture, dislocation, or joint effusion. Normal alignment and joint spaces. Tiny olecranon spur. Mild soft tissue edema posteriorly. IMPRESSION: Mild posterior soft tissue edema. No fracture or subluxation. Electronically  Signed   By: Keith Rake M.D.   On: 04/30/2020 20:40   DG Wrist Complete Left  Result Date: 04/30/2020 CLINICAL DATA:  Fall onto left arm with left upper extremity pain. EXAM: LEFT WRIST - COMPLETE 3+ VIEW COMPARISON:  None. FINDINGS: Nondisplaced impaction fracture of the distal radial metaphysis with cortical buckling. There is no extension to the radiocarpal or distal radioulnar joints. Widening of the scapholunate interval at 5 mm, of uncertain chronicity. There are scattered degenerative carpal bone cysts. Moderate osteoarthritis of the thumb carpal metacarpal joint. Soft tissue edema is most prominent about the dorsum of the wrist. IMPRESSION: 1. Nondisplaced impaction fracture of the distal radial metaphysis. 2. Widening of the scapholunate interval, of uncertain chronicity. Electronically Signed   By: Keith Rake M.D.   On: 04/30/2020 20:42   DG Shoulder Left  Result Date: 04/30/2020 CLINICAL DATA:  Fall onto left arm.  Left upper extremity pain. EXAM: LEFT SHOULDER - 2+ VIEW COMPARISON:  None. FINDINGS: No acute fracture or dislocation. Moderate glenohumeral osteoarthritis with mild joint space narrowing, subchondral cystic change in inferior spurring. The acromioclavicular joint is congruent. No abnormal soft tissue calcifications. No acute findings in the included portion of the chest. Thoracic scoliosis. IMPRESSION: 1. No acute fracture or dislocation. 2. Moderate glenohumeral osteoarthritis. Electronically Signed   By: Keith Rake M.D.   On: 04/30/2020 20:39   I, Lynne Leader, personally (independently) visualized and  performed the interpretation of the images attached in this note.     Assessment and Plan: 65 y.o. female with left distal radius fracture.  Patient was treated correctly in the urgent care with appropriate sugar-tong splinting.  The splint is well made and comfortable.  I think removing it now would potentially put her wrist at risk for fracture  displacement.  Plan for recheck next week.  Patient has an appointment scheduled my partner Dr. Tamala Julian already for the 20th.  At that point would recommend removal of splint and x-ray.  Happy to see me if needed although certainly could follow-up with Dr. Tamala Julian.  Plan for Exos cast.  Warned patient that her fracture may take longer than 6 weeks to heal.  Limited hydrocodone for pain control.  She notes tramadol is not sufficient to control pain.  PDMP reviewed during this encounter. No orders of the defined types were placed in this encounter.  Meds ordered this encounter  Medications  . HYDROcodone-acetaminophen (NORCO/VICODIN) 5-325 MG tablet    Sig: Take 1 tablet by mouth every 6 (six) hours as needed.    Dispense:  15 tablet    Refill:  0     Discussed warning signs or symptoms. Please see discharge instructions. Patient expresses understanding.   The above documentation has been reviewed and is accurate and complete Lynne Leader, M.D.

## 2020-05-01 NOTE — Patient Instructions (Addendum)
Thank you for coming in today.  STOP tramadol while taking the hydrocodone.  Recheck in about 1 week.   We will get xrays next week.

## 2020-05-01 NOTE — ED Provider Notes (Signed)
[facility]    CSN: [id number] Arrival date & time: 04/30/20  1913      History   Chief Complaint Chief Complaint  Patient presents with  . Fall    HPI SACHE SANE is a 65 y.o. female history of CHF, hypertension, prior DVT, presenting today for evaluation of left arm pain after a fall.  Patient tripped down 3 concrete steps earlier today and landed on her left arm.  Since has developed a lot of pain to wrist elbow and shoulder, most prominent at the wrist.  Denies hitting head or loss of consciousness.  Denies any difficulty breathing.  Denies pain in hips or lower extremities.  HPI  Past Medical History:  Diagnosis Date  . Anemia    takes Ferrous Sulfate daily  . Anxiety    takes Citalopram daily  . Arthritis   . Asthma    2004-prior to gastric bypass and no problems since  . CHF (congestive heart failure) (HCC)    takes Lasix daily as needed  . Chronic back pain    spondylolisthesis/stenosis/radiculopathy  . Complication of anesthesia yrs ago   slow to wake up  . Depression   . Diabetes ([place])   . DVT (deep venous thrombosis) ([place]) 01/17/2013   past hx. -tx.5-6 yrs ago bilateral legs, occ. sporadic swelling, has IVC filter implanted  . Dysrhythmia    "heart tends to flutter"  . Fibromyalgia   . Fracture    right foot and is in a cam boot  . Gallstones   . GERD (gastroesophageal reflux disease)    hx of-no meds now  . Heart murmur   . History of bronchitis 2012 or 2013  . History of colon polyps   . Hypertension    takes Metoprolol daily  . Insomnia    takes Melatonin daily  . Pelvic floor dysfunction   . Peripheral neuropathy   . Pneumonia 90's   hx of  . S/P gastric bypass 2003  . Sleep apnea    no cpap used in many yrs after weight lost-no machine now  . Urinary frequency   . Urinary urgency   . Vitamin D deficiency     Patient Active Problem List   Diagnosis Date Noted  . Recurrent Clostridioides difficile infection  02/28/2020  . Pyelonephritis 02/28/2020  . Abnormal urine odor 08/10/2019  . Gastrointestinal complaint 07/05/2019  . Muscle strain of gluteal region, right, initial encounter 05/11/2019  . Dyspnea on exertion 03/13/2019  . Rapid heartbeat 03/13/2019  . Systolic ejection murmur 92/42/6834  . Leg swelling 10/09/2018  . Insomnia 05/19/2018  . Frequent refractory urinary tract infections 05/05/2018  . Right leg pain 12/16/2017  . History of deep vein thrombosis (DVT) of lower extremity 12/12/2017  . Cavovarus deformity of foot, acquired, unspecified laterality 04/07/2017  . S/P gastric bypass 08/18/2016  . Muscle cramps 07/09/2016  . Adjustment disorder with mixed anxiety and depressed mood 03/05/2016  . Fatigue 03/05/2016  . Cervical disc disorder with radiculopathy of cervical region 02/27/2016  . Degenerative arthritis of right knee 02/27/2016  . Right knee pain 02/24/2016  . Left shoulder pain 02/24/2016  . Routine general medical examination at a health care facility 12/12/2015  . Schatzki's ring   . Lumbar radiculopathy 03/05/2015  . Peripheral neuropathy 01/17/2015  . Chest pain with moderate risk for cardiac etiology 05/21/2013  . Fibromyalgia 02/21/2011  . Essential hypertension 12/04/2006  . Hx of diabetes mellitus 10/20/2006  . Morbid obesity ([place]) 10/20/2006  .  MDD (major depressive disorder) ([place]) 10/20/2006  . OBSTRUCTIVE SLEEP APNEA 10/20/2006  . OSTEOARTHRITIS 10/20/2006    Past Surgical History:  Procedure Laterality Date  . BALLOON DILATION N/A 01/31/2013   Procedure: BALLOON DILATION;  Surgeon: Arta Silence, MD;  Location: [facility];  Service: Endoscopy;  Laterality: N/A;  . CARDIAC EVENT MONITOR  03/2019   Predominantly sinus rhythm.  Rates range from 48-124 bpm.  Average 74 bpm.  Frequent short bursts (3-15 beats) PAT/PSVT--not indicated as being symptomatic on diary.  Otherwise rare PACs and PVCs.  . CHOLECYSTECTOMY  2007  . COLONOSCOPY    .  DILATION AND CURETTAGE OF UTERUS  yrs ago  . ESOPHAGOGASTRODUODENOSCOPY (EGD) WITH PROPOFOL  03/01/2012   Procedure: ESOPHAGOGASTRODUODENOSCOPY (EGD) WITH PROPOFOL;  Surgeon: Arta Silence, MD;  Location: [facility];  Service: Endoscopy;  Laterality: N/A;  . ESOPHAGOGASTRODUODENOSCOPY (EGD) WITH PROPOFOL N/A 01/31/2013   Procedure: ESOPHAGOGASTRODUODENOSCOPY (EGD) WITH PROPOFOL;  Surgeon: Arta Silence, MD;  Location: [facility];  Service: Endoscopy;  Laterality: N/A;  . ESOPHAGOGASTRODUODENOSCOPY (EGD) WITH PROPOFOL N/A 09/02/2015   Procedure: ESOPHAGOGASTRODUODENOSCOPY (EGD) WITH PROPOFOL;  Surgeon: Gatha Mayer, MD;  Location: [facility];  Service: Endoscopy;  Laterality: N/A;  . GASTRIC BY-PASS  2004  . HERNIA REPAIR  2005  . INSERTION OF VENA CAVA FILTER  01-17-13   inserted 2004- "abdomen"  . LIGAMENT REPAIR Right 1987   Rt. knee scope  . MAXIMUM ACCESS (MAS)POSTERIOR LUMBAR INTERBODY FUSION (PLIF) 2 LEVEL N/A 06/07/2014   Procedure: L4-5 L5-S1 FOR MAXIMUM ACCESS (MAS) POSTERIOR LUMBAR INTERBODY FUSION ;  Surgeon: Erline Levine, MD;  Location: [facility];  Service: Neurosurgery;  Laterality: N/A;  L4-5 L5-S1 FOR MAXIMUM ACCESS (MAS) POSTERIOR LUMBAR INTERBODY FUSION   . TONSILLECTOMY     as child  . TRANSTHORACIC ECHOCARDIOGRAM  03/2019   EF 60 to 65%.  Mild LVH.  No R WMA.  Normal RV size.  Normal LA, mild RA dilation.  No aortic valve stenosis or sclerosis.    OB History   No obstetric history on file.      Home Medications    Prior to Admission medications   Medication Sig Start Date End Date Taking? Authorizing Provider  ACCU-CHEK SOFTCLIX LANCETS lancets CHECK BLOOD SUGARS TWICE DAILY 01/26/17   Hoyt Koch, MD  albuterol (VENTOLIN HFA) 108 (90 Base) MCG/ACT inhaler INHALE 2 PUFFS INTO THE LUNGS EVERY 6 HOURS AS NEEDED FOR WHEEZING OR SHORTNESS OF BREATH 02/07/20   Hoyt Koch, MD  Alcohol Swabs (B-D SINGLE USE SWABS REGULAR) PADS USE AS  DIRECTED EVERY DAY 04/15/20   Hoyt Koch, MD  amLODipine (NORVASC) 10 MG tablet TAKE 1 TABLET EVERY DAY.  ANNUAL APPOINTMENT DUE IN SEPT MUST SEE PROVIDER FOR FUTURE REFILLS. 02/21/20   Hoyt Koch, MD  Ascorbic Acid (VITAMIN C) 1000 MG tablet Take 1,000 mg by mouth daily.    [provider]  BLACK COHOSH PO Take 1 tablet by mouth daily.    [provider]  Blood Glucose Calibration (ACCU-CHEK AVIVA) SOLN Use as directed 10/29/15   Hoyt Koch, MD  Blood Glucose Monitoring Suppl (ACCU-CHEK AVIVA PLUS) w/Device KIT USE AS DIRECTED  TO CHECK BLOOD SUGAR EVERY DAY 11/05/19   Hoyt Koch, MD  Calcium Citrate-Vitamin D (CALCIUM CITRATE + D3) 315-250 MG-UNIT TABS Take 1 tablet by mouth daily.    [provider]  cetirizine (ZYRTEC) 10 MG tablet Take 10 mg by mouth daily.    [provider]  Cranberry 250 MG CAPS Take 1 capsule by mouth daily.    [provider]  Cyanocobalamin (VITAMIN B-12) 1000 MCG/15ML LIQD Take by mouth.    [provider]  DULoxetine (CYMBALTA) 30 MG capsule Take 3 capsules (90 mg total) by mouth daily. 04/22/20   Hoyt Koch, MD  ferrous sulfate 325 (65 FE) MG tablet Take 325 mg by mouth daily with breakfast.    [provider]  furosemide (LASIX) 40 MG tablet TAKE 1 TABLET DAILY AS NEEDED. ANNUAL APPOINTMENT DUE. MUST SEE PROVIDER FOR FUTURE REFILLS. 04/15/20   Hoyt Koch, MD  glucose blood (ACCU-CHEK AVIVA PLUS) test strip 1 each by Other route 2 (two) times daily. Use to check blood sugars twice a day 11/15/19   Hoyt Koch, MD  Multiple Vitamins-Minerals (MULTIVITAMINS THER. W/MINERALS) TABS Take 1 tablet by mouth daily.    [provider]  pantoprazole (PROTONIX) 40 MG tablet TAKE 1 TABLET(40 MG) BY MOUTH DAILY Patient taking differently: Take 40 mg by mouth 2 (two) times daily. 11/07/19   Hoyt Koch, MD  potassium chloride SA  (KLOR-CON) 20 MEQ tablet TAKE 1 TABLET EVERY DAY 04/15/20   Hoyt Koch, MD  traMADol (ULTRAM) 50 MG tablet TAKE 1 TABLET(50 MG) BY MOUTH FOUR TIMES DAILY 03/18/20   Hoyt Koch, MD  traZODone (DESYREL) 50 MG tablet TAKE 1 TABLET AT BEDTIME AS NEEDED FOR SLEEP. ANNUAL APPT DUE IN SEPT MUST SEE PROVIDER FOR FUTURE REFILLS 02/07/20   Biagio Borg, MD  zinc gluconate 50 MG tablet Take 50 mg by mouth daily.    [provider]    Family History Family History  Problem Relation Age of Onset  . Diabetes Mother   . Atrial fibrillation Mother   . Hypertension Mother   . Heart disease Father        Unsure of details  . Hypertension Father   . Prostate cancer Father   . Alzheimer's disease Father   . Kidney disease Brother   . Healthy Sister   . Diabetes Maternal Grandmother   . Heart disease Maternal Grandmother   . Stroke Maternal Grandfather 50  . Hypertension Maternal Grandfather   . Hypertension Paternal Grandmother   . Alzheimer's disease Paternal Grandmother   . Colon cancer Neg Hx   . Colon polyps Neg Hx   . Stomach cancer Neg Hx   . Esophageal cancer Neg Hx   . Pancreatic cancer Neg Hx   . Liver disease Neg Hx     Social History Social History   Tobacco Use  . Smoking status: Former Smoker    Packs/day: 0.50    Years: 10.00    Pack years: 5.00    Types: Cigarettes    Quit date: 04/19/2009    Years since quitting: 11.0  . Smokeless tobacco: Never Used  Substance Use Topics  . Alcohol use: Yes    Alcohol/week: 0.0 standard drinks    Comment: occ. social- wine-1 drink monthly  . Drug use: No     Allergies   Carafate [sucralfate] and Sulfonamide derivatives   Review of Systems Review of Systems  Constitutional: Negative for fatigue and fever.  HENT: Negative for congestion, sinus pressure and sore throat.   Eyes: Negative for photophobia, pain and visual disturbance.  Respiratory: Negative for cough and shortness of breath.    Cardiovascular: Negative for chest pain.  Gastrointestinal: Negative for abdominal pain, nausea and vomiting.  Genitourinary: Negative for  decreased urine volume and hematuria.  Musculoskeletal: Positive for arthralgias, joint swelling and myalgias. Negative for neck pain and neck stiffness.  Neurological: Negative for dizziness, syncope, facial asymmetry, speech difficulty, weakness, light-headedness, numbness and headaches.     Physical Exam Triage Vital Signs ED Triage Vitals  Enc Vitals Group     BP 04/30/20 1949 132/64     Pulse Rate 04/30/20 1949 64     Resp 04/30/20 1949 18     Temp 04/30/20 1949 97.9 F (36.6 C)     Temp Source 04/30/20 1949 Oral     SpO2 04/30/20 1949 95 %     Weight --      Height --      Head Circumference --      Peak Flow --      Pain Score 04/30/20 1950 5     Pain Loc --      Pain Edu? --      Excl. in [place] [place]? --    No data found.  Updated Vital Signs BP 132/64 (BP Location: Left Arm)   Pulse 64   Temp 97.9 F (36.6 C) (Oral)   Resp 18   SpO2 95%   Visual Acuity Right Eye Distance:   Left Eye Distance:   Bilateral Distance:    Right Eye Near:   Left Eye Near:    Bilateral Near:     Physical Exam Vitals and nursing note reviewed.  Constitutional:      Appearance: She is well-developed and well-nourished.     Comments: No acute distress  HENT:     Head: Normocephalic and atraumatic.     Nose: Nose normal.  Eyes:     Conjunctiva/sclera: Conjunctivae normal.  Cardiovascular:     Rate and Rhythm: Normal rate.  Pulmonary:     Effort: Pulmonary effort is normal. No respiratory distress.  Abdominal:     General: There is no distension.  Musculoskeletal:        General: Normal range of motion.     Cervical back: Neck supple.     Comments: Left wrist/hand: Moderate swelling and deformity noted on radial aspect of wrist, radial pulse 2+, sensation intact distally, nontender throughout metacarpals and all 5 fingers.  Left elbow:  Patient resisting full extension at elbow, tenderness to palpation along bony prominences of the elbow  Left shoulder: Patient diffusely tender along clavicle AC joint scapular spine and proximal humerus, patient resisting full abduction of shoulder  Skin:    General: Skin is warm and dry.  Neurological:     Mental Status: She is alert and oriented to person, place, and time.  Psychiatric:        Mood and Affect: Mood and affect normal.      UC Treatments / Results  Labs (all labs ordered are listed, but only abnormal results are displayed) Labs Reviewed - No data to display  EKG   Radiology DG Elbow Complete Left  Result Date: 04/30/2020 CLINICAL DATA:  Fall onto left arm with left upper extremity pain. EXAM: LEFT ELBOW - COMPLETE 3+ VIEW COMPARISON:  None. FINDINGS: There is no evidence of fracture, dislocation, or joint effusion. Normal alignment and joint spaces. Tiny olecranon spur. Mild soft tissue edema posteriorly. IMPRESSION: Mild posterior soft tissue edema. No fracture or subluxation. Electronically Signed   By: Keith Rake M.D.   On: 04/30/2020 20:40   DG Wrist Complete Left  Result Date: 04/30/2020 CLINICAL DATA:  Fall onto left arm with left upper  extremity pain. EXAM: LEFT WRIST - COMPLETE 3+ VIEW COMPARISON:  None. FINDINGS: Nondisplaced impaction fracture of the distal radial metaphysis with cortical buckling. There is no extension to the radiocarpal or distal radioulnar joints. Widening of the scapholunate interval at 5 mm, of uncertain chronicity. There are scattered degenerative carpal bone cysts. Moderate osteoarthritis of the thumb carpal metacarpal joint. Soft tissue edema is most prominent about the dorsum of the wrist. IMPRESSION: 1. Nondisplaced impaction fracture of the distal radial metaphysis. 2. Widening of the scapholunate interval, of uncertain chronicity. Electronically Signed   By: Keith Rake M.D.   On: 04/30/2020 20:42   DG Shoulder  Left  Result Date: 04/30/2020 CLINICAL DATA:  Fall onto left arm.  Left upper extremity pain. EXAM: LEFT SHOULDER - 2+ VIEW COMPARISON:  None. FINDINGS: No acute fracture or dislocation. Moderate glenohumeral osteoarthritis with mild joint space narrowing, subchondral cystic change in inferior spurring. The acromioclavicular joint is congruent. No abnormal soft tissue calcifications. No acute findings in the included portion of the chest. Thoracic scoliosis. IMPRESSION: 1. No acute fracture or dislocation. 2. Moderate glenohumeral osteoarthritis. Electronically Signed   By: Keith Rake M.D.   On: 04/30/2020 20:39    Procedures Procedures (including critical care time)  Medications Ordered in UC Medications - No data to display  Initial Impression / Assessment and Plan / UC Course  I have reviewed the triage vital signs and the nursing notes.  Pertinent labs & imaging results that were available during my care of the patient were reviewed by me and considered in my medical decision making (see chart for details).     Left distal radius impacted fracture, placed in sugar-tong splint and will have follow-up with Ortho.  Tylenol for mild to moderate pain, patient already has tramadol prescribed to her.  Advised patient to monitor circulation and comfort and splinting, patient to call back if having any problems or concerns about the fit of the splint.  Discussed strict return precautions. Patient verbalized understanding and is agreeable with plan.  Final Clinical Impressions(s) / UC Diagnoses   Final diagnoses:  Closed fracture of distal end of left radius, unspecified fracture morphology, initial encounter     Discharge Instructions     Distal radius fracture Call ortho in the morning for follow up  Tylenol 1000 mg every 4-6 hours Tramadol for severe pain Follow up if developing any increased pani or concerns about splint for reapplication    ED Prescriptions    None      PDMP not reviewed this encounter.   Janith Lima, [place] 05/01/20 [phone]

## 2020-05-05 ENCOUNTER — Telehealth: Payer: Self-pay | Admitting: *Deleted

## 2020-05-05 NOTE — Telephone Encounter (Signed)
Duloxetine PA initiated via CoverMyMeds  Key: [id number]  PA Case ID: [id number]

## 2020-05-06 ENCOUNTER — Other Ambulatory Visit: Payer: Self-pay | Admitting: Internal Medicine

## 2020-05-08 ENCOUNTER — Encounter: Payer: Self-pay | Admitting: Family Medicine

## 2020-05-08 ENCOUNTER — Other Ambulatory Visit: Payer: Self-pay

## 2020-05-08 ENCOUNTER — Ambulatory Visit (INDEPENDENT_AMBULATORY_CARE_PROVIDER_SITE_OTHER): Payer: Medicare PPO | Admitting: Family Medicine

## 2020-05-08 ENCOUNTER — Ambulatory Visit (INDEPENDENT_AMBULATORY_CARE_PROVIDER_SITE_OTHER): Payer: Medicare PPO

## 2020-05-08 VITALS — BP 144/82 | HR 75 | Ht 66.0 in

## 2020-05-08 DIAGNOSIS — M79642 Pain in left hand: Secondary | ICD-10-CM

## 2020-05-08 DIAGNOSIS — S52552A Other extraarticular fracture of lower end of left radius, initial encounter for closed fracture: Secondary | ICD-10-CM

## 2020-05-08 DIAGNOSIS — M25532 Pain in left wrist: Secondary | ICD-10-CM

## 2020-05-08 DIAGNOSIS — M7989 Other specified soft tissue disorders: Secondary | ICD-10-CM | POA: Diagnosis not present

## 2020-05-08 MED ORDER — HYDROCODONE-ACETAMINOPHEN 5-325 MG PO TABS: 1.0000 | ORAL_TABLET | Freq: Two times a day (BID) | ORAL | 0 refills | Status: DC | PRN

## 2020-05-08 NOTE — Assessment & Plan Note (Addendum)
Patient does have a distal radius fracture. Does seem to have an avulsion on the volar aspect of the wrist as well. Patient does have bruising noted over the [facility] joint which makes concern for the arthritic changes that was noted previously as a potential fracture as well.  Patient's pain is fairly severe.  Seems to be somewhat out of proportion.  We were considering the possibility of a CT scan but unable to get it done and actually has a follow-up with hand specialist and they can discuss that advanced imaging is warranted.  Patient was unable to do any type of pronation or supination without severe pains with Patient in the sugar-tong at this moment.  Patient knows if worsening pain she needs to seek medical attention immediately follow-up with me again more on an as-needed basis.  Patient given another 5-day supply of pain medication.  Patient normally does take tramadol and knows not to take it which is what she did for the last 5 days.

## 2020-05-08 NOTE — Patient Instructions (Addendum)
Good to see you Left sided brace

## 2020-05-08 NOTE — Progress Notes (Signed)
[place] Phone: [phone] Subjective:   IKandace Blitz, am serving as a scribe for Dr. Hulan Saas. This visit occurred during the SARS-CoV-2 public health emergency.  Safety protocols were in place, including screening questions prior to the visit, additional usage of staff PPE, and extensive cleaning of exam room while observing appropriate contact time as indicated for disinfecting solutions.   I'm seeing this patient by the request  of:  Hoyt Koch, MD  CC: Back pain wrist pain follow-up  EEF:[id number]  [name] is a 65 y.o. female coming in with complaint of back pain and left wrist pain. Patient states she fell down the stairs last Wednesday. Was trying to go down the stairs sideways. Lost her footing on the first step. States she fell directly on her shoulder. Has some shoulder and neck pain. Loss of ROM and a lot of pain. States she was told that it was fractured in 2 places. Numbness and tingling in the fingertips. Weakness in the wrist. States she has been trying to move her fingers. The pain is keeping her up at night. Has been using ice for the pain.10 /10 at its worse. Back is doing a lot better since epidural. She has had some pain since she fell.  Nothing as severe as the wrist at this time.  Patient did undergo an right-sided L3 nerve root block April 15, 2020   Patient did have a left distal radius fracture seen by another provider.  Patient had x-rays on April 30, 2020.  X-ray showed the patient had a nondisplaced impaction fracture of the distal radius with cortical buckling does have gapping of the scaphoid lunate area.]  X-rays of left shoulder show moderate glenohumeral arthritic changes  Past Medical History:  Diagnosis Date  . Anemia    takes Ferrous Sulfate daily  . Anxiety    takes Citalopram daily  . Arthritis   . Asthma    2004-prior to gastric bypass  and no problems since  . CHF (congestive heart failure) (HCC)    takes Lasix daily as needed  . Chronic back pain    spondylolisthesis/stenosis/radiculopathy  . Complication of anesthesia yrs ago   slow to wake up  . Depression   . Diabetes ([place])   . DVT (deep venous thrombosis) ([place]) 01/17/2013   past hx. -tx.5-6 yrs ago bilateral legs, occ. sporadic swelling, has IVC filter implanted  . Dysrhythmia    "heart tends to flutter"  . Fibromyalgia   . Fracture    right foot and is in a cam boot  . Gallstones   . GERD (gastroesophageal reflux disease)    hx of-no meds now  . Heart murmur   . History of bronchitis 2012 or 2013  . History of colon polyps   . Hypertension    takes Metoprolol daily  . Insomnia    takes Melatonin daily  . Pelvic floor dysfunction   . Peripheral neuropathy   . Pneumonia 90's   hx of  . S/P gastric bypass 2003  . Sleep apnea    no cpap used in many yrs after weight lost-no machine now  . Urinary frequency   . Urinary urgency   . Vitamin D deficiency    Past Surgical History:  Procedure Laterality Date  . BALLOON DILATION N/A 01/31/2013   Procedure: BALLOON DILATION;  Surgeon: Arta Silence, MD;  Location: [facility];  Service: Endoscopy;  Laterality: N/A;  .  CARDIAC EVENT MONITOR  03/2019   Predominantly sinus rhythm.  Rates range from 48-124 bpm.  Average 74 bpm.  Frequent short bursts (3-15 beats) PAT/PSVT--not indicated as being symptomatic on diary.  Otherwise rare PACs and PVCs.  . CHOLECYSTECTOMY  2007  . COLONOSCOPY    . DILATION AND CURETTAGE OF UTERUS  yrs ago  . ESOPHAGOGASTRODUODENOSCOPY (EGD) WITH PROPOFOL  03/01/2012   Procedure: ESOPHAGOGASTRODUODENOSCOPY (EGD) WITH PROPOFOL;  Surgeon: Arta Silence, MD;  Location: [facility];  Service: Endoscopy;  Laterality: N/A;  . ESOPHAGOGASTRODUODENOSCOPY (EGD) WITH PROPOFOL N/A 01/31/2013   Procedure: ESOPHAGOGASTRODUODENOSCOPY (EGD) WITH PROPOFOL;  Surgeon: Arta Silence, MD;   Location: [facility];  Service: Endoscopy;  Laterality: N/A;  . ESOPHAGOGASTRODUODENOSCOPY (EGD) WITH PROPOFOL N/A 09/02/2015   Procedure: ESOPHAGOGASTRODUODENOSCOPY (EGD) WITH PROPOFOL;  Surgeon: Gatha Mayer, MD;  Location: [facility];  Service: Endoscopy;  Laterality: N/A;  . GASTRIC BY-PASS  2004  . HERNIA REPAIR  2005  . INSERTION OF VENA CAVA FILTER  01-17-13   inserted 2004- "abdomen"  . LIGAMENT REPAIR Right 1987   Rt. knee scope  . MAXIMUM ACCESS (MAS)POSTERIOR LUMBAR INTERBODY FUSION (PLIF) 2 LEVEL N/A 06/07/2014   Procedure: L4-5 L5-S1 FOR MAXIMUM ACCESS (MAS) POSTERIOR LUMBAR INTERBODY FUSION ;  Surgeon: Erline Levine, MD;  Location: [facility];  Service: Neurosurgery;  Laterality: N/A;  L4-5 L5-S1 FOR MAXIMUM ACCESS (MAS) POSTERIOR LUMBAR INTERBODY FUSION   . TONSILLECTOMY     as child  . TRANSTHORACIC ECHOCARDIOGRAM  03/2019   EF 60 to 65%.  Mild LVH.  No R WMA.  Normal RV size.  Normal LA, mild RA dilation.  No aortic valve stenosis or sclerosis.   Social History   Socioeconomic History  . Marital status: Widowed    Spouse name: Not on file  . Number of children: 0  . Years of education: college  . Highest education level: Not on file  Occupational History  . Occupation: retired  Tobacco Use  . Smoking status: Former Smoker    Packs/day: 0.50    Years: 10.00    Pack years: 5.00    Types: Cigarettes    Quit date: 04/19/2009    Years since quitting: 11.0  . Smokeless tobacco: Never Used  Substance and Sexual Activity  . Alcohol use: Yes    Alcohol/week: 0.0 standard drinks    Comment: occ. social- wine-1 drink monthly  . Drug use: No  . Sexual activity: Not Currently  Other Topics Concern  . Not on file  Social History Narrative   Widow.  No kids.  Lives with her mother who is 22.  She does walk with a cane.  She is a retired Investment banker, corporate   She quit smoking in 2003   Social Determinants of Health   Financial Resource Strain: Low Risk   .  Difficulty of Paying Living Expenses: Not hard at all  Food Insecurity: Not on file  Transportation Needs: Not on file  Physical Activity: Not on file  Stress: Not on file  Social Connections: Not on file   Allergies  Allergen Reactions  . Carafate [Sucralfate] Hives, Itching and Other (See Comments)    Angioedema     . Sulfonamide Derivatives Rash    Syncope   Family History  Problem Relation Age of Onset  . Diabetes Mother   . Atrial fibrillation Mother   . Hypertension Mother   . Heart disease Father        Unsure of details  .  Hypertension Father   . Prostate cancer Father   . Alzheimer's disease Father   . Kidney disease Brother   . Healthy Sister   . Diabetes Maternal Grandmother   . Heart disease Maternal Grandmother   . Stroke Maternal Grandfather 50  . Hypertension Maternal Grandfather   . Hypertension Paternal Grandmother   . Alzheimer's disease Paternal Grandmother   . Colon cancer Neg Hx   . Colon polyps Neg Hx   . Stomach cancer Neg Hx   . Esophageal cancer Neg Hx   . Pancreatic cancer Neg Hx   . Liver disease Neg Hx      Current Outpatient Medications (Cardiovascular):  .  amLODipine (NORVASC) 10 MG tablet, Take 1 tablet (10 mg total) by mouth daily. .  furosemide (LASIX) 40 MG tablet, TAKE 1 TABLET DAILY AS NEEDED. ANNUAL APPOINTMENT DUE. MUST SEE PROVIDER FOR FUTURE REFILLS.  Current Outpatient Medications (Respiratory):  .  albuterol (VENTOLIN HFA) 108 (90 Base) MCG/ACT inhaler, INHALE 2 PUFFS INTO THE LUNGS EVERY 6 HOURS AS NEEDED FOR WHEEZING OR SHORTNESS OF BREATH .  cetirizine (ZYRTEC) 10 MG tablet, Take 10 mg by mouth daily.  Current Outpatient Medications (Analgesics):  .  HYDROcodone-acetaminophen (NORCO/VICODIN) 5-325 MG tablet, Take 1 tablet by mouth every 12 (twelve) hours as needed. .  traMADol (ULTRAM) 50 MG tablet, TAKE 1 TABLET(50 MG) BY MOUTH FOUR TIMES DAILY  Current Outpatient Medications (Hematological):  Marland Kitchen  Cyanocobalamin  (VITAMIN B-12) 1000 MCG/15ML LIQD, Take by mouth. .  ferrous sulfate 325 (65 FE) MG tablet, Take 325 mg by mouth daily with breakfast.  Current Outpatient Medications (Other):  Marland Kitchen  ACCU-CHEK SOFTCLIX LANCETS lancets, CHECK BLOOD SUGARS TWICE DAILY .  Alcohol Swabs (B-D SINGLE USE SWABS REGULAR) PADS, USE AS DIRECTED EVERY DAY .  Ascorbic Acid (VITAMIN C) 1000 MG tablet, Take 1,000 mg by mouth daily. Marland Kitchen  BLACK COHOSH PO, Take 1 tablet by mouth daily. .  Blood Glucose Calibration (ACCU-CHEK AVIVA) SOLN, Use as directed .  Blood Glucose Monitoring Suppl (ACCU-CHEK AVIVA PLUS) w/Device KIT, USE AS DIRECTED  TO CHECK BLOOD SUGAR EVERY DAY .  Calcium Citrate-Vitamin D 315-250 MG-UNIT TABS, Take 1 tablet by mouth daily. .  Cranberry 250 MG CAPS, Take 1 capsule by mouth daily. .  DULoxetine (CYMBALTA) 30 MG capsule, Take 3 capsules (90 mg total) by mouth daily. Marland Kitchen  glucose blood (ACCU-CHEK AVIVA PLUS) test strip, 1 each by Other route 2 (two) times daily. Use to check blood sugars twice a day .  Multiple Vitamins-Minerals (MULTIVITAMINS THER. W/MINERALS) TABS, Take 1 tablet by mouth daily. .  pantoprazole (PROTONIX) 40 MG tablet, TAKE 1 TABLET(40 MG) BY MOUTH DAILY (Patient taking differently: Take 40 mg by mouth 2 (two) times daily.) .  potassium chloride SA (KLOR-CON) 20 MEQ tablet, TAKE 1 TABLET EVERY DAY .  traZODone (DESYREL) 50 MG tablet, TAKE 1 TABLET AT BEDTIME AS NEEDED FOR SLEEP. ANNUAL APPT DUE IN SEPT MUST SEE PROVIDER FOR FUTURE REFILLS .  zinc gluconate 50 MG tablet, Take 50 mg by mouth daily.   Reviewed prior external information including notes and imaging from  primary care provider As well as notes that were available from care everywhere and other healthcare systems.  Past medical history, social, surgical and family history all reviewed in electronic medical record.  No pertanent information unless stated regarding to the chief complaint.   Review of Systems:  No headache, visual  changes, nausea, vomiting, diarrhea, constipation, dizziness, abdominal pain, skin  rash, fevers, chills, night sweats, weight loss, swollen lymph nodes, body aches, joint swelling, chest pain, shortness of breath, mood changes. POSITIVE muscle aches, body aches  Objective  Blood pressure (!) 144/82, pulse 75, height $RemoveBe'5\' 6"'vCPYAQDjW$  (1.676 m), SpO2 97 %.   General: No apparent distress alert and oriented x3 mood and affect normal, dressed appropriately.  Patient is uncomfortable. HEENT: Pupils equal, extraocular movements intact  Respiratory: Patient's speak in full sentences and does not appear short of breath  Cardiovascular: Trace lower extremity edema, non tender, no erythema  Gait antalgic gait walking with the aid of a cane MSK: Patient was wearing a sugar-tong splint.  Removed today.  Patient does have significant bruising and swelling still noted of the wrist.  Bruising on the volar aspect of the wrist, as well as the radial and ulnar aspect of the wrist.  Patient has pain over the anatomical snuffbox as well as the [facility] joint fairly severely.  Pain over the distal radius.  Patient is unable to tolerate even 5 degrees of supination or pronation at the moment.  Patient is neurovascularly intact with good capillary refill.  Grip strength is fair but weak secondary to pain    Impression and Recommendations:     The above documentation has been reviewed and is accurate and complete Jessica Pulley, DO

## 2020-05-13 DIAGNOSIS — S52552A Other extraarticular fracture of lower end of left radius, initial encounter for closed fracture: Secondary | ICD-10-CM | POA: Diagnosis not present

## 2020-05-13 NOTE — Telephone Encounter (Signed)
Duloxetine has been approved through 04/18/2021.

## 2020-05-19 DIAGNOSIS — Z1211 Encounter for screening for malignant neoplasm of colon: Secondary | ICD-10-CM | POA: Diagnosis not present

## 2020-05-19 DIAGNOSIS — R112 Nausea with vomiting, unspecified: Secondary | ICD-10-CM | POA: Diagnosis not present

## 2020-05-19 DIAGNOSIS — R197 Diarrhea, unspecified: Secondary | ICD-10-CM | POA: Diagnosis not present

## 2020-05-20 DIAGNOSIS — Z888 Allergy status to other drugs, medicaments and biological substances status: Secondary | ICD-10-CM | POA: Diagnosis not present

## 2020-05-20 DIAGNOSIS — S52552A Other extraarticular fracture of lower end of left radius, initial encounter for closed fracture: Secondary | ICD-10-CM | POA: Diagnosis not present

## 2020-05-20 DIAGNOSIS — Z882 Allergy status to sulfonamides status: Secondary | ICD-10-CM | POA: Diagnosis not present

## 2020-05-20 DIAGNOSIS — Z833 Family history of diabetes mellitus: Secondary | ICD-10-CM | POA: Diagnosis not present

## 2020-05-20 DIAGNOSIS — Z8249 Family history of ischemic heart disease and other diseases of the circulatory system: Secondary | ICD-10-CM | POA: Diagnosis not present

## 2020-05-20 DIAGNOSIS — M1990 Unspecified osteoarthritis, unspecified site: Secondary | ICD-10-CM | POA: Diagnosis not present

## 2020-05-20 DIAGNOSIS — K219 Gastro-esophageal reflux disease without esophagitis: Secondary | ICD-10-CM | POA: Diagnosis not present

## 2020-05-20 DIAGNOSIS — G8929 Other chronic pain: Secondary | ICD-10-CM | POA: Diagnosis not present

## 2020-05-20 DIAGNOSIS — Z823 Family history of stroke: Secondary | ICD-10-CM | POA: Diagnosis not present

## 2020-05-20 DIAGNOSIS — I10 Essential (primary) hypertension: Secondary | ICD-10-CM | POA: Diagnosis not present

## 2020-05-20 DIAGNOSIS — E669 Obesity, unspecified: Secondary | ICD-10-CM | POA: Diagnosis not present

## 2020-05-20 DIAGNOSIS — F330 Major depressive disorder, recurrent, mild: Secondary | ICD-10-CM | POA: Diagnosis not present

## 2020-05-20 DIAGNOSIS — K859 Acute pancreatitis without necrosis or infection, unspecified: Secondary | ICD-10-CM | POA: Diagnosis not present

## 2020-05-20 DIAGNOSIS — Z6837 Body mass index (BMI) 37.0-37.9, adult: Secondary | ICD-10-CM | POA: Diagnosis not present

## 2020-05-20 DIAGNOSIS — G4700 Insomnia, unspecified: Secondary | ICD-10-CM | POA: Diagnosis not present

## 2020-05-20 DIAGNOSIS — J309 Allergic rhinitis, unspecified: Secondary | ICD-10-CM | POA: Diagnosis not present

## 2020-05-23 ENCOUNTER — Encounter: Payer: Self-pay | Admitting: Internal Medicine

## 2020-05-23 DIAGNOSIS — Z1231 Encounter for screening mammogram for malignant neoplasm of breast: Secondary | ICD-10-CM

## 2020-05-23 DIAGNOSIS — E2839 Other primary ovarian failure: Secondary | ICD-10-CM

## 2020-06-02 ENCOUNTER — Ambulatory Visit: Payer: Medicare PPO | Admitting: Cardiology

## 2020-06-02 ENCOUNTER — Telehealth: Payer: Self-pay | Admitting: Internal Medicine

## 2020-06-02 NOTE — Telephone Encounter (Signed)
LVM for pt to rtn my call to schedule awv with nha. Please schedule this appt if pt calls the office.  

## 2020-06-03 DIAGNOSIS — S52552A Other extraarticular fracture of lower end of left radius, initial encounter for closed fracture: Secondary | ICD-10-CM | POA: Diagnosis not present

## 2020-06-10 DIAGNOSIS — S52552A Other extraarticular fracture of lower end of left radius, initial encounter for closed fracture: Secondary | ICD-10-CM | POA: Diagnosis not present

## 2020-06-10 DIAGNOSIS — G5602 Carpal tunnel syndrome, left upper limb: Secondary | ICD-10-CM | POA: Diagnosis not present

## 2020-06-11 ENCOUNTER — Telehealth: Payer: Medicare HMO

## 2020-06-11 NOTE — Progress Notes (Deleted)
Chronic Care Management Pharmacy Note  06/11/2020 Name:  [name] MRN:  [id number] DOB:  Sep 10, 1955  Subjective: [name] is an 65 y.o. year old female who is a primary patient of Hoyt Koch, MD.  The CCM team was consulted for assistance with disease management and care coordination needs.    {CCMTELEPHONEFACETOFACE:21091510} for {CCMINITIALFOLLOWUPCHOICE:21091511} in response to provider referral for pharmacy case management and/or care coordination services.   Consent to Services:  {CCMCONSENTOPTIONS:25074}  Patient Care Team: Hoyt Koch, MD as PCP - General (Internal Medicine) Charlton Haws, [facility] as Pharmacist (Pharmacist)  Recent office visits: ***  Recent consult visits: [facility] visits: {Hospital DC Yes/No:25215}  Objective:  Lab Results  Component Value Date   CREATININE 0.83 02/22/2020   BUN 14 02/22/2020   GFR 80.97 10/11/2019   GFRNONAA >60 02/22/2020   GFRAA 95 05/01/2019   NA 144 02/22/2020   K 4.1 02/22/2020   CALCIUM 8.4 (L) 02/22/2020   CO2 22 02/22/2020    Lab Results  Component Value Date/Time   HGBA1C 5.5 01/02/2019 02:03 PM   HGBA1C 5.2 12/11/2015 02:23 PM   GFR 80.97 10/11/2019 03:26 PM   GFR 74.88 08/09/2019 03:48 PM   MICROALBUR <0.7 01/02/2019 02:03 PM    Last diabetic Eye exam:  Lab Results  Component Value Date/Time   HMDIABEYEEXA No Retinopathy 12/09/2015 12:00 AM    Last diabetic Foot exam: No results found for: HMDIABFOOTEX   Lab Results  Component Value Date   CHOL 146 01/02/2019   HDL 73.40 01/02/2019   LDLCALC 60 01/02/2019   TRIG 66.0 01/02/2019   CHOLHDL 2 01/02/2019    Hepatic Function Latest Ref Rng & Units 02/22/2020 10/11/2019 09/14/2019  Total Protein 6.5 - 8.1 g/dL 6.5 6.4 6.1  Albumin 3.5 - 5.0 g/dL 3.6 3.7 -  AST 15 - 41 U/L 33 24 31  ALT 0 - 44 U/L 25 26 58(H)  Alk Phosphatase 38 - 126 U/L 116 176(H) -  Total Bilirubin 0.3 - 1.2 mg/dL 0.3 0.3 0.3   Bilirubin, Direct 0.0 - 0.3 mg/dL - - -    Lab Results  Component Value Date/Time   TSH 0.98 01/02/2019 02:03 PM   TSH 0.83 09/15/2017 03:43 PM    CBC Latest Ref Rng & Units 02/22/2020 10/11/2019 09/14/2019  WBC 4.0 - 10.5 K/uL 6.1 6.3 7.9  Hemoglobin 12.0 - 15.0 g/dL 12.5 12.1 11.4(L)  Hematocrit 36.0 - 46.0 % 39.1 37.5 36.1  Platelets 150 - 400 K/uL 198 191.0 227    Lab Results  Component Value Date/Time   VD25OH 12 (L) 09/14/2019 04:41 PM   VD25OH <7.00 (L) 09/15/2017 03:43 PM    Clinical ASCVD: {YES/NO:21197} The 10-year ASCVD risk score Mikey Bussing DC Jr., et al., 2013) is: 9.1%   Values used to calculate the score:     Age: 1 years     Sex: Female     Is Non-Hispanic African American: Yes     Diabetic: No     Tobacco smoker: No     Systolic Blood Pressure: 633 mmHg     Is BP treated: Yes     HDL Cholesterol: 73.4 mg/dL     Total Cholesterol: 146 mg/dL    Depression screen [facility] 2/9 02/28/2020 10/26/2018 05/19/2018  Decreased Interest 3 2 1   Down, Depressed, Hopeless 2 1 3   PHQ - 2 Score 5 3 4   Altered sleeping 3 2 3   Tired, decreased energy 3 1  3  Change in appetite $RemoveBef'3 1 3  'PWqpgHvlen$ Feeling bad or failure about yourself  0 1 2  Trouble concentrating 0 1 2  Moving slowly or fidgety/restless 0 0 1  Suicidal thoughts 0 0 0  PHQ-9 Score $RemoveBef'14 9 18  'JZMhpfDnTf$ Difficult doing work/chores Somewhat difficult - -  Some recent data might be hidden     ***Other: (CHADS2VASc if Afib, MMRC or CAT for COPD, ACT, DEXA)  Social History   Tobacco Use  Smoking Status Former Smoker  . Packs/day: 0.50  . Years: 10.00  . Pack years: 5.00  . Types: Cigarettes  . Quit date: 04/19/2009  . Years since quitting: 11.1  Smokeless Tobacco Never Used   BP Readings from Last 3 Encounters:  05/08/20 (!) 144/82  05/01/20 130/74  04/30/20 132/64   Pulse Readings from Last 3 Encounters:  05/08/20 75  05/01/20 72  04/30/20 64   Wt Readings from Last 3 Encounters:  05/01/20 235 lb 6.4 oz (106.8 kg)   02/28/20 252 lb (114.3 kg)  02/06/20 273 lb (123.8 kg)    Assessment/Interventions: Review of patient past medical history, allergies, medications, health status, including review of consultants reports, laboratory and other test data, was performed as part of comprehensive evaluation and provision of chronic care management services.   SDOH:  (Social Determinants of Health) assessments and interventions performed: {yes/no:20286}   CCM Care Plan  Allergies  Allergen Reactions  . Carafate [Sucralfate] Hives, Itching and Other (See Comments)    Angioedema     . Sulfonamide Derivatives Rash    Syncope    Medications Reviewed Today    Reviewed by Lyndal Pulley, DO (Physician) on 05/08/20 at [place] List Status: <None>  Medication Order Taking? Sig Documenting Provider Last Dose Status Informant  ACCU-CHEK SOFTCLIX LANCETS lancets [id number] Yes CHECK BLOOD SUGARS TWICE DAILY Hoyt Koch, MD Taking Active   albuterol (VENTOLIN HFA) 108 (90 Base) MCG/ACT inhaler [id number] Yes INHALE 2 PUFFS INTO THE LUNGS EVERY 6 HOURS AS NEEDED FOR WHEEZING OR SHORTNESS OF Hoover Brunette, MD Taking Active   Alcohol Swabs (B-D SINGLE USE SWABS REGULAR) PADS [id number] Yes USE AS DIRECTED EVERY DAY Hoyt Koch, MD Taking Active   amLODipine (NORVASC) 10 MG tablet [id number] Yes Take 1 tablet (10 mg total) by mouth daily. Hoyt Koch, MD Taking Active   Ascorbic Acid (VITAMIN C) 1000 MG tablet [id number] Yes Take 1,000 mg by mouth daily. [provider] Taking Active   BLACK COHOSH PO [id number] Yes Take 1 tablet by mouth daily. [provider] Taking Active Self  Blood Glucose Calibration (ACCU-CHEK AVIVA) SOLN [id number] Yes Use as directed Hoyt Koch, MD Taking Active Self  Blood Glucose Monitoring Suppl (ACCU-CHEK AVIVA PLUS) w/Device KIT [id number] Yes USE AS DIRECTED  TO CHECK BLOOD SUGAR EVERY DAY Hoyt Koch, MD Taking  Active   Calcium Citrate-Vitamin D 315-250 MG-UNIT TABS [id number] Yes Take 1 tablet by mouth daily. [provider] Taking Active   cetirizine (ZYRTEC) 10 MG tablet [id number] Yes Take 10 mg by mouth daily. [provider] Taking Active   Cranberry 250 MG CAPS [id number] Yes Take 1 capsule by mouth daily. [provider] Taking Active   Cyanocobalamin (VITAMIN B-12) 1000 MCG/15ML LIQD [id number] Yes Take by mouth. [provider] Taking Active   DULoxetine (CYMBALTA) 30 MG capsule [id number] Yes Take 3 capsules (90 mg total) by mouth daily. Hoyt Koch, MD Taking Active  ferrous sulfate 325 (65 FE) MG tablet [id number] Yes Take 325 mg by mouth daily with breakfast. [provider] Taking Active   furosemide (LASIX) 40 MG tablet [id number] Yes TAKE 1 TABLET DAILY AS NEEDED. ANNUAL APPOINTMENT DUE. MUST SEE PROVIDER FOR FUTURE REFILLS. Hoyt Koch, MD Taking Active   glucose blood (ACCU-CHEK AVIVA PLUS) test strip [id number] Yes 1 each by Other route 2 (two) times daily. Use to check blood sugars twice a day Hoyt Koch, MD Taking Active   HYDROcodone-acetaminophen (NORCO/VICODIN) 5-325 MG tablet [id number] Yes Take 1 tablet by mouth every 12 (twelve) hours as needed. Lyndal Pulley, DO  Active   Multiple Vitamins-Minerals (MULTIVITAMINS THER. W/MINERALS) Sheral Flow [id number] Yes Take 1 tablet by mouth daily. [provider] Taking Active Self  pantoprazole (PROTONIX) 40 MG tablet [id number] Yes TAKE 1 TABLET(40 MG) BY MOUTH DAILY  Patient taking differently: Take 40 mg by mouth 2 (two) times daily.   Hoyt Koch, MD Taking Active   potassium chloride SA (KLOR-CON) 20 MEQ tablet [id number] Yes TAKE 1 TABLET EVERY DAY Hoyt Koch, MD Taking Active   traMADol (ULTRAM) 50 MG tablet [id number] Yes TAKE 1 TABLET(50 MG) BY MOUTH FOUR TIMES DAILY Hoyt Koch, MD Taking Active   traZODone (DESYREL) 50 MG tablet  [id number] Yes TAKE 1 TABLET AT BEDTIME AS NEEDED FOR SLEEP. ANNUAL APPT DUE IN SEPT MUST SEE PROVIDER FOR FUTURE REFILLS Biagio Borg, MD Taking Active   zinc gluconate 50 MG tablet [id number] Yes Take 50 mg by mouth daily. [provider] Taking Active           Patient Active Problem List   Diagnosis Date Noted  . Closed fracture of left distal radius 05/01/2020  . Recurrent Clostridioides difficile infection 02/28/2020  . Pyelonephritis 02/28/2020  . Abnormal urine odor 08/10/2019  . Gastrointestinal complaint 07/05/2019  . Muscle strain of gluteal region, right, initial encounter 05/11/2019  . Dyspnea on exertion 03/13/2019  . Rapid heartbeat 03/13/2019  . Systolic ejection murmur 68/06/2120  . Leg swelling 10/09/2018  . Insomnia 05/19/2018  . Frequent refractory urinary tract infections 05/05/2018  . Right leg pain 12/16/2017  . History of deep vein thrombosis (DVT) of lower extremity 12/12/2017  . Cavovarus deformity of foot, acquired, unspecified laterality 04/07/2017  . S/P gastric bypass 08/18/2016  . Muscle cramps 07/09/2016  . Adjustment disorder with mixed anxiety and depressed mood 03/05/2016  . Fatigue 03/05/2016  . Cervical disc disorder with radiculopathy of cervical region 02/27/2016  . Degenerative arthritis of right knee 02/27/2016  . Right knee pain 02/24/2016  . Left shoulder pain 02/24/2016  . Routine general medical examination at a health care facility 12/12/2015  . Schatzki's ring   . Lumbar radiculopathy 03/05/2015  . Peripheral neuropathy 01/17/2015  . Chest pain with moderate risk for cardiac etiology 05/21/2013  . Fibromyalgia 02/21/2011  . Essential hypertension 12/04/2006  . Hx of diabetes mellitus 10/20/2006  . Morbid obesity ([place]) 10/20/2006  . MDD (major depressive disorder) ([place]) 10/20/2006  . OBSTRUCTIVE SLEEP APNEA 10/20/2006  . OSTEOARTHRITIS 10/20/2006    Immunization History  Administered Date(s) Administered  .  Influenza Whole 02/01/2007  . Influenza,inj,Quad PF,6+ Mos 12/16/2017  . Influenza-Unspecified 02/19/2013, 05/20/2014, 02/01/2016, 12/17/2016, 02/19/2020  . Moderna Sars-Covid-2 Vaccination 05/13/2019, 06/10/2019, 02/19/2020  . Pneumococcal Conjugate-13 01/02/2019  . Td 11/12/1997  . Tdap 08/07/2014  . Zoster Recombinat (Shingrix) 10/11/2019    Conditions to be addressed/monitored:  {USCCMDZASSESSMENTOPTIONS:23563}  There are no care  plans that you recently modified to display for this patient.    Medication Assistance: {MEDASSISTANCEINFO:25044}  Patient's preferred pharmacy is:  [facility], [address] [place] [address] Phone: [phone] Fax: [phone]  [facility] [place], [address] [place] [address] Phone: [phone] Fax: [phone]  Uses pill box? {Yes or If no, why not?:20788} Pt endorses ***% compliance  We discussed: {Pharmacy options:24294} Patient decided to: {US Pharmacy Plan:23885}  Care Plan and Follow Up Patient Decision:  {FOLLOWUP:24991}  Plan: {CM FOLLOW UP PLAN:25073}  ***  Current Barriers:  . {pharmacybarriers:24917} . ***  Pharmacist Clinical Goal(s):  Marland Kitchen Over the next *** days, patient will {PHARMACYGOALCHOICES:24921} through collaboration with PharmD and provider.  . ***  Interventions: . 1:1 collaboration with Hoyt Koch, MD regarding development and update of comprehensive plan of care as evidenced by provider attestation and co-signature . Inter-disciplinary care team collaboration (see longitudinal plan of care) . Comprehensive medication review performed; medication list updated in electronic medical record  {CCM PHARMD DISEASE STATES:25130}  Patient Goals/Self-Care Activities . Over the next *** days, patient will:  -  {pharmacypatientgoals:24919}  Follow Up Plan: {CM FOLLOW UP AGTX:64680}

## 2020-06-16 DIAGNOSIS — Z1211 Encounter for screening for malignant neoplasm of colon: Secondary | ICD-10-CM | POA: Diagnosis not present

## 2020-07-01 ENCOUNTER — Other Ambulatory Visit: Payer: Self-pay | Admitting: Internal Medicine

## 2020-07-08 DIAGNOSIS — S52552D Other extraarticular fracture of lower end of left radius, subsequent encounter for closed fracture with routine healing: Secondary | ICD-10-CM | POA: Diagnosis not present

## 2020-07-08 DIAGNOSIS — G5602 Carpal tunnel syndrome, left upper limb: Secondary | ICD-10-CM | POA: Diagnosis not present

## 2020-07-11 ENCOUNTER — Telehealth: Payer: Self-pay | Admitting: Family Medicine

## 2020-07-11 NOTE — Telephone Encounter (Signed)
error 

## 2020-07-23 DIAGNOSIS — G5602 Carpal tunnel syndrome, left upper limb: Secondary | ICD-10-CM | POA: Diagnosis not present

## 2020-07-28 ENCOUNTER — Telehealth (INDEPENDENT_AMBULATORY_CARE_PROVIDER_SITE_OTHER): Payer: Medicare HMO | Admitting: Family

## 2020-07-28 DIAGNOSIS — R059 Cough, unspecified: Secondary | ICD-10-CM

## 2020-07-28 DIAGNOSIS — I509 Heart failure, unspecified: Secondary | ICD-10-CM | POA: Diagnosis not present

## 2020-07-28 DIAGNOSIS — E119 Type 2 diabetes mellitus without complications: Secondary | ICD-10-CM | POA: Diagnosis not present

## 2020-07-28 MED ORDER — BENZONATATE 100 MG PO CAPS: 100.0000 mg | ORAL_CAPSULE | Freq: Three times a day (TID) | ORAL | 0 refills | Status: DC | PRN

## 2020-07-28 MED ORDER — PREDNISONE 20 MG PO TABS: 20.0000 mg | ORAL_TABLET | Freq: Every day | ORAL | 0 refills | Status: DC

## 2020-07-28 MED ORDER — DOXYCYCLINE HYCLATE 100 MG PO TABS
100.0000 mg | ORAL_TABLET | Freq: Two times a day (BID) | ORAL | 0 refills | Status: DC
Start: 2020-07-28 — End: 2020-09-19

## 2020-07-28 NOTE — Progress Notes (Signed)
[name] is a 65 y.o. female with the following history as recorded in EpicCare:  Patient Active Problem List   Diagnosis Date Noted  . Closed fracture of left distal radius 05/01/2020  . Recurrent Clostridioides difficile infection 02/28/2020  . Pyelonephritis 02/28/2020  . Abnormal urine odor 08/10/2019  . Gastrointestinal complaint 07/05/2019  . Muscle strain of gluteal region, right, initial encounter 05/11/2019  . Dyspnea on exertion 03/13/2019  . Rapid heartbeat 03/13/2019  . Systolic ejection murmur 91/63/8466  . Leg swelling 10/09/2018  . Insomnia 05/19/2018  . Frequent refractory urinary tract infections 05/05/2018  . Right leg pain 12/16/2017  . History of deep vein thrombosis (DVT) of lower extremity 12/12/2017  . Cavovarus deformity of foot, acquired, unspecified laterality 04/07/2017  . S/P gastric bypass 08/18/2016  . Muscle cramps 07/09/2016  . Adjustment disorder with mixed anxiety and depressed mood 03/05/2016  . Fatigue 03/05/2016  . Cervical disc disorder with radiculopathy of cervical region 02/27/2016  . Degenerative arthritis of right knee 02/27/2016  . Right knee pain 02/24/2016  . Left shoulder pain 02/24/2016  . Routine general medical examination at a health care facility 12/12/2015  . Schatzki's ring   . Lumbar radiculopathy 03/05/2015  . Peripheral neuropathy 01/17/2015  . Chest pain with moderate risk for cardiac etiology 05/21/2013  . Fibromyalgia 02/21/2011  . Essential hypertension 12/04/2006  . Hx of diabetes mellitus 10/20/2006  . Morbid obesity ([place]) 10/20/2006  . MDD (major depressive disorder) ([place]) 10/20/2006  . OBSTRUCTIVE SLEEP APNEA 10/20/2006  . OSTEOARTHRITIS 10/20/2006    Current Outpatient Medications  Medication Sig Dispense Refill  . benzonatate (TESSALON) 100 MG capsule Take 1 capsule (100 mg total) by mouth 3 (three) times daily as needed. 20 capsule 0  . doxycycline (VIBRA-TABS) 100 MG tablet Take 1 tablet (100  mg total) by mouth 2 (two) times daily. 14 tablet 0  . predniSONE (DELTASONE) 20 MG tablet Take 1 tablet (20 mg total) by mouth daily with breakfast. 5 tablet 0  . ACCU-CHEK SOFTCLIX LANCETS lancets CHECK BLOOD SUGARS TWICE DAILY 200 each 2  . albuterol (VENTOLIN HFA) 108 (90 Base) MCG/ACT inhaler INHALE 2 PUFFS INTO THE LUNGS EVERY 6 HOURS AS NEEDED FOR WHEEZING OR SHORTNESS OF BREATH 3 each 2  . Alcohol Swabs (B-D SINGLE USE SWABS REGULAR) PADS USE AS DIRECTED EVERY DAY 100 each 0  . amLODipine (NORVASC) 10 MG tablet Take 1 tablet (10 mg total) by mouth daily. 90 tablet 2  . Ascorbic Acid (VITAMIN C) 1000 MG tablet Take 1,000 mg by mouth daily.    Marland Kitchen BLACK COHOSH PO Take 1 tablet by mouth daily.    . Blood Glucose Calibration (ACCU-CHEK AVIVA) SOLN Use as directed 3 each 3  . Blood Glucose Monitoring Suppl (ACCU-CHEK AVIVA PLUS) w/Device KIT USE AS DIRECTED  TO CHECK BLOOD SUGAR EVERY DAY 1 kit 0  . Calcium Citrate-Vitamin D 315-250 MG-UNIT TABS Take 1 tablet by mouth daily.    . cetirizine (ZYRTEC) 10 MG tablet Take 10 mg by mouth daily.    . Cranberry 250 MG CAPS Take 1 capsule by mouth daily.    . Cyanocobalamin (VITAMIN B-12) 1000 MCG/15ML LIQD Take by mouth.    . DULoxetine (CYMBALTA) 30 MG capsule Take 3 capsules (90 mg total) by mouth daily. 270 capsule 1  . ferrous sulfate 325 (65 FE) MG tablet Take 325 mg by mouth daily with breakfast.    . furosemide (LASIX) 40 MG tablet TAKE 1 TABLET DAILY  AS NEEDED. ANNUAL APPOINTMENT DUE. MUST SEE PROVIDER FOR FUTURE REFILLS. 90 tablet 0  . glucose blood (ACCU-CHEK AVIVA PLUS) test strip 1 each by Other route 2 (two) times daily. Use to check blood sugars twice a day 200 each 2  . HYDROcodone-acetaminophen (NORCO/VICODIN) 5-325 MG tablet Take 1 tablet by mouth every 12 (twelve) hours as needed. 10 tablet 0  . Multiple Vitamins-Minerals (MULTIVITAMINS THER. W/MINERALS) TABS Take 1 tablet by mouth daily.    . pantoprazole (PROTONIX) 40 MG tablet TAKE  1 TABLET(40 MG) BY MOUTH DAILY (Patient taking differently: Take 40 mg by mouth 2 (two) times daily.) 30 tablet 2  . potassium chloride SA (KLOR-CON) 20 MEQ tablet TAKE 1 TABLET EVERY DAY 90 tablet 0  . traMADol (ULTRAM) 50 MG tablet TAKE 1 TABLET(50 MG) BY MOUTH FOUR TIMES DAILY 120 tablet 5  . traZODone (DESYREL) 50 MG tablet TAKE 1 TABLET AT BEDTIME AS NEEDED FOR SLEEP. ANNUAL APPT DUE IN SEPT MUST SEE PROVIDER FOR FUTURE REFILLS 30 tablet 0  . zinc gluconate 50 MG tablet Take 50 mg by mouth daily.     No current facility-administered medications for this visit.    Allergies: Carafate [sucralfate] and Sulfonamide derivatives  Past Medical History:  Diagnosis Date  . Anemia    takes Ferrous Sulfate daily  . Anxiety    takes Citalopram daily  . Arthritis   . Asthma    2004-prior to gastric bypass and no problems since  . CHF (congestive heart failure) (HCC)    takes Lasix daily as needed  . Chronic back pain    spondylolisthesis/stenosis/radiculopathy  . Complication of anesthesia yrs ago   slow to wake up  . Depression   . Diabetes (HCC)   . DVT (deep venous thrombosis) (HCC) 01/17/2013   past hx. -tx.5-6 yrs ago bilateral legs, occ. sporadic swelling, has IVC filter implanted  . Dysrhythmia    "heart tends to flutter"  . Fibromyalgia   . Fracture    right foot and is in a cam boot  . Gallstones   . GERD (gastroesophageal reflux disease)    hx of-no meds now  . Heart murmur   . History of bronchitis 2012 or 2013  . History of colon polyps   . Hypertension    takes Metoprolol daily  . Insomnia    takes Melatonin daily  . Pelvic floor dysfunction   . Peripheral neuropathy   . Pneumonia 90's   hx of  . S/P gastric bypass 2003  . Sleep apnea    no cpap used in many yrs after weight lost-no machine now  . Urinary frequency   . Urinary urgency   . Vitamin D deficiency     Past Surgical History:  Procedure Laterality Date  . BALLOON DILATION N/A 01/31/2013    Procedure: BALLOON DILATION;  Surgeon: Willis Modena, MD;  Location: [facility];  Service: Endoscopy;  Laterality: N/A;  . CARDIAC EVENT MONITOR  03/2019   Predominantly sinus rhythm.  Rates range from 48-124 bpm.  Average 74 bpm.  Frequent short bursts (3-15 beats) PAT/PSVT--not indicated as being symptomatic on diary.  Otherwise rare PACs and PVCs.  . CHOLECYSTECTOMY  2007  . COLONOSCOPY    . DILATION AND CURETTAGE OF UTERUS  yrs ago  . ESOPHAGOGASTRODUODENOSCOPY (EGD) WITH PROPOFOL  03/01/2012   Procedure: ESOPHAGOGASTRODUODENOSCOPY (EGD) WITH PROPOFOL;  Surgeon: Willis Modena, MD;  Location: [facility];  Service: Endoscopy;  Laterality: N/A;  . ESOPHAGOGASTRODUODENOSCOPY (EGD) WITH PROPOFOL N/A  01/31/2013   Procedure: ESOPHAGOGASTRODUODENOSCOPY (EGD) WITH PROPOFOL;  Surgeon: Arta Silence, MD;  Location: [facility];  Service: Endoscopy;  Laterality: N/A;  . ESOPHAGOGASTRODUODENOSCOPY (EGD) WITH PROPOFOL N/A 09/02/2015   Procedure: ESOPHAGOGASTRODUODENOSCOPY (EGD) WITH PROPOFOL;  Surgeon: Gatha Mayer, MD;  Location: [facility];  Service: Endoscopy;  Laterality: N/A;  . GASTRIC BY-PASS  2004  . HERNIA REPAIR  2005  . INSERTION OF VENA CAVA FILTER  01-17-13   inserted 2004- "abdomen"  . LIGAMENT REPAIR Right 1987   Rt. knee scope  . MAXIMUM ACCESS (MAS)POSTERIOR LUMBAR INTERBODY FUSION (PLIF) 2 LEVEL N/A 06/07/2014   Procedure: L4-5 L5-S1 FOR MAXIMUM ACCESS (MAS) POSTERIOR LUMBAR INTERBODY FUSION ;  Surgeon: Erline Levine, MD;  Location: [facility];  Service: Neurosurgery;  Laterality: N/A;  L4-5 L5-S1 FOR MAXIMUM ACCESS (MAS) POSTERIOR LUMBAR INTERBODY FUSION   . TONSILLECTOMY     as child  . TRANSTHORACIC ECHOCARDIOGRAM  03/2019   EF 60 to 65%.  Mild LVH.  No R WMA.  Normal RV size.  Normal LA, mild RA dilation.  No aortic valve stenosis or sclerosis.    Family History  Problem Relation Age of Onset  . Diabetes Mother   . Atrial fibrillation Mother   . Hypertension Mother    . Heart disease Father        Unsure of details  . Hypertension Father   . Prostate cancer Father   . Alzheimer's disease Father   . Kidney disease Brother   . Healthy Sister   . Diabetes Maternal Grandmother   . Heart disease Maternal Grandmother   . Stroke Maternal Grandfather 50  . Hypertension Maternal Grandfather   . Hypertension Paternal Grandmother   . Alzheimer's disease Paternal Grandmother   . Colon cancer Neg Hx   . Colon polyps Neg Hx   . Stomach cancer Neg Hx   . Esophageal cancer Neg Hx   . Pancreatic cancer Neg Hx   . Liver disease Neg Hx     Social History   Tobacco Use  . Smoking status: Former Smoker    Packs/day: 0.50    Years: 10.00    Pack years: 5.00    Types: Cigarettes    Quit date: 04/19/2009    Years since quitting: 11.2  . Smokeless tobacco: Never Used  Substance Use Topics  . Alcohol use: Yes    Alcohol/week: 0.0 standard drinks    Comment: occ. social- wine-1 drink monthly    Subjective:   I connected with [name] on 07/28/20 at  2:20 PM EDT by a video enabled telemedicine application and verified that I am speaking with the correct person using two identifiers.   I discussed the limitations of evaluation and management by telemedicine and the availability of in person appointments. The patient expressed understanding and agreed to proceed. Provider in office/ patient is at home; provider and patient are only 2 people on video call.   Woke up Saturday am with chest and head congestion; has tried taking OTC Coricidin with some relief as well as OTC Mucinex; concerned that congestion moving into chest- "my chest feels tight when I breathe." No fever; Limited relief with albuterol inhaler; on Thursday, patient did go to funeral and is concerned for possible COVID exposure- would be interested in getting test done.      Objective:  There were no vitals filed for this visit.  General: Well developed, well nourished, in no acute  distress  Head: Normocephalic and atraumatic  Lungs: Respirations unlabored;  Neurologic: Alert and oriented; speech intact; face symmetrical; moves all extremities well; CNII-XII intact without focal deficit   Assessment:  1. Cough     Plan:  Recommend COVID testing- patient initially agreed and then called back and prefers to do home test instead; she will let us know the results of that test; Start Doxycycline and Prednisone; if symptoms persist and COVID test negative, to consider CXR;  Increase fluids, rest and follow up worse, no better.    No follow-ups on file.  Orders Placed This Encounter  Procedures  . Novel Coronavirus, NAA ([facility])    Order Specific Question:   Is this test for diagnosis or screening    Answer:   Diagnosis of ill patient    Order Specific Question:   Symptomatic for COVID-19 as defined by CDC    Answer:   Yes    Order Specific Question:   Date of Symptom Onset    Answer:   07/26/2020    Order Specific Question:   Hospitalized for COVID-19    Answer:   No    Order Specific Question:   Admitted to ICU for COVID-19    Answer:   No    Order Specific Question:   Previously tested for COVID-19    Answer:   No    Order Specific Question:   Resident in a congregate (group) care setting    Answer:   No    Order Specific Question:   Is the patient student?    Answer:   No    Order Specific Question:   Employed in healthcare setting    Answer:   No    Order Specific Question:   Pregnant    Answer:   No    Order Specific Question:   Has patient completed COVID vaccination(s) (2 doses of Pfizer/Moderna 1 dose of [employer])    Answer:   Yes    Order Specific Question:   Has patient completed COVID Booster / 3rd dose    Answer:   Unknown    Requested Prescriptions   Signed Prescriptions Disp Refills  . predniSONE (DELTASONE) 20 MG tablet 5 tablet 0    Sig: Take 1 tablet (20 mg total) by mouth daily with breakfast.  . doxycycline (VIBRA-TABS) 100  MG tablet 14 tablet 0    Sig: Take 1 tablet (100 mg total) by mouth 2 (two) times daily.  . benzonatate (TESSALON) 100 MG capsule 20 capsule 0    Sig: Take 1 capsule (100 mg total) by mouth 3 (three) times daily as needed.

## 2020-07-29 ENCOUNTER — Encounter: Payer: Self-pay | Admitting: Family

## 2020-08-11 ENCOUNTER — Telehealth (INDEPENDENT_AMBULATORY_CARE_PROVIDER_SITE_OTHER): Payer: Medicare HMO | Admitting: Internal Medicine

## 2020-08-11 DIAGNOSIS — Z86718 Personal history of other venous thrombosis and embolism: Secondary | ICD-10-CM | POA: Diagnosis not present

## 2020-08-11 DIAGNOSIS — A498 Other bacterial infections of unspecified site: Secondary | ICD-10-CM | POA: Diagnosis not present

## 2020-08-11 DIAGNOSIS — R071 Chest pain on breathing: Secondary | ICD-10-CM

## 2020-08-11 MED ORDER — PREDNISONE 20 MG PO TABS: 40.0000 mg | ORAL_TABLET | Freq: Every day | ORAL | 0 refills | Status: DC

## 2020-08-11 NOTE — Assessment & Plan Note (Signed)
Checking D-dimer to rule out PE given chest pain on breathing and lung symptoms with hemoptysis.

## 2020-08-11 NOTE — Progress Notes (Signed)
Virtual Visit via Video Note  I connected with [name] on 08/11/20 at 10:20 AM EDT by a video enabled telemedicine application and verified that I am speaking with the correct person using two identifiers.  The patient and the provider were at separate locations throughout the entire encounter. Patient location: home, Provider location: work   I discussed the limitations of evaluation and management by telemedicine and the availability of in person appointments. The patient expressed understanding and agreed to proceed. The patient and the provider were the only parties present for the visit unless noted in HPI below.  History of Present Illness: The patient is a 65 y.o. female with visit for cough. Started several weeks ago and treated with prednisone and doxycycline and cough perles. This did help but has come back. Started back about 5 days ago. She is having cough and purulent. Coughing mostly green sputum. Has some blood in the sputum which is gone now. She is also having pain with breathing deep, not sore to touch. She denies that this feels like a muscle soreness. Feels deeper. Hx DVT not on anticoagulation. More coughing when lying down and at night. Humidifier has helped some. Some tightness with breathing. Overall it is stable since return. Some pain in the back aching. Mild sore throat but not bad.   Observations/Objective: Appearance: normal, breathing appears normal with some coughing during visit, no pain to self palpation of chest wall, casual grooming, abdomen does not appear distended, throat not well visualized, mental status is A and O times 3  Assessment and Plan: See problem oriented charting  Follow Up Instructions: Rx prednisone, checking CBC, CMP, d-dimer and CXR  I discussed the assessment and treatment plan with the patient. The patient was provided an opportunity to ask questions and all were answered. The patient agreed with the plan and demonstrated an  understanding of the instructions.   The patient was advised to call back or seek an in-person evaluation if the symptoms worsen or if the condition fails to improve as anticipated.  Hoyt Koch, MD

## 2020-08-11 NOTE — Assessment & Plan Note (Addendum)
Will avoid antibiotics unless dictated by CXR findings. Recent course doxycycline and no diarrhea or abdominal symptoms currently.

## 2020-08-11 NOTE — Assessment & Plan Note (Signed)
Given hx DVT checking d-dimer. Ordered CXR and CBC and CMP. Rx prednisone. Given hx C dif will not prescribe further antibiotics without findings on CXR to justify.

## 2020-08-12 ENCOUNTER — Other Ambulatory Visit: Payer: Self-pay | Admitting: Internal Medicine

## 2020-08-12 ENCOUNTER — Other Ambulatory Visit (INDEPENDENT_AMBULATORY_CARE_PROVIDER_SITE_OTHER): Payer: Medicare HMO

## 2020-08-12 ENCOUNTER — Other Ambulatory Visit: Payer: Self-pay

## 2020-08-12 ENCOUNTER — Ambulatory Visit (INDEPENDENT_AMBULATORY_CARE_PROVIDER_SITE_OTHER): Payer: Medicare HMO

## 2020-08-12 DIAGNOSIS — R071 Chest pain on breathing: Secondary | ICD-10-CM

## 2020-08-12 DIAGNOSIS — G5602 Carpal tunnel syndrome, left upper limb: Secondary | ICD-10-CM | POA: Diagnosis not present

## 2020-08-12 DIAGNOSIS — S52552D Other extraarticular fracture of lower end of left radius, subsequent encounter for closed fracture with routine healing: Secondary | ICD-10-CM | POA: Diagnosis not present

## 2020-08-12 DIAGNOSIS — R059 Cough, unspecified: Secondary | ICD-10-CM | POA: Diagnosis not present

## 2020-08-12 LAB — CBC
HCT: 38.2 % (ref 36.0–46.0)
Hemoglobin: 12.4 g/dL (ref 12.0–15.0)
MCHC: 32.5 g/dL (ref 30.0–36.0)
MCV: 85.7 fl (ref 78.0–100.0)
Platelets: 217 10*3/uL (ref 150.0–400.0)
RBC: 4.45 Mil/uL (ref 3.87–5.11)
RDW: 15 % (ref 11.5–15.5)
WBC: 8.8 10*3/uL (ref 4.0–10.5)

## 2020-08-12 LAB — COMPREHENSIVE METABOLIC PANEL
ALT: 22 U/L (ref 0–35)
AST: 20 U/L (ref 0–37)
Albumin: 3.6 g/dL (ref 3.5–5.2)
Alkaline Phosphatase: 127 U/L — ABNORMAL HIGH (ref 39–117)
BUN: 12 mg/dL (ref 6–23)
CO2: 20 mEq/L (ref 19–32)
Calcium: 8.4 mg/dL (ref 8.4–10.5)
Chloride: 114 mEq/L — ABNORMAL HIGH (ref 96–112)
Creatinine, Ser: 0.74 mg/dL (ref 0.40–1.20)
GFR: 84.06 mL/min (ref >60.00)
Glucose, Bld: 115 mg/dL — ABNORMAL HIGH (ref 70–99)
Potassium: 3.2 mEq/L — ABNORMAL LOW (ref 3.5–5.1)
Sodium: 142 mEq/L (ref 135–145)
Total Bilirubin: 0.3 mg/dL (ref 0.2–1.2)
Total Protein: 6.6 g/dL (ref 6.0–8.3)

## 2020-08-13 LAB — D-DIMER, QUANTITATIVE: D-Dimer, Quant: 0.63 mcg/mL FEU — ABNORMAL HIGH (ref <0.50)

## 2020-08-19 ENCOUNTER — Encounter: Payer: Self-pay | Admitting: Internal Medicine

## 2020-08-19 MED ORDER — FLUTICASONE PROPIONATE 50 MCG/ACT NA SUSP: 2.0000 | Freq: Every day | NASAL | 6 refills | Status: DC

## 2020-08-19 MED ORDER — PREDNISONE 20 MG PO TABS: 40.0000 mg | ORAL_TABLET | Freq: Every day | ORAL | 0 refills | Status: DC

## 2020-08-27 ENCOUNTER — Other Ambulatory Visit: Payer: Self-pay | Admitting: Internal Medicine

## 2020-08-28 ENCOUNTER — Telehealth: Payer: Self-pay | Admitting: Pharmacist

## 2020-08-29 NOTE — Progress Notes (Addendum)
Chronic Care Management Pharmacy Assistant   Name: [name]  MRN: [id number] DOB: 1955/07/25  Reason for Encounter: General Disease State Call    Recent office visits:  08/11/20 Dr. Sharlet Salina video call  Recent consult visits:  05/08/20 Illene Bolus. Sports Medicine 07/28/20 Jodi Mourning, [place] [facility] visits:  None  Medications: Outpatient Encounter Medications as of 08/28/2020  Medication Sig   ACCU-CHEK SOFTCLIX LANCETS lancets CHECK BLOOD SUGARS TWICE DAILY   albuterol (VENTOLIN HFA) 108 (90 Base) MCG/ACT inhaler INHALE 2 PUFFS INTO THE LUNGS EVERY 6 HOURS AS NEEDED FOR WHEEZING OR SHORTNESS OF BREATH   Alcohol Swabs (B-D SINGLE USE SWABS REGULAR) PADS USE AS DIRECTED EVERY DAY   amLODipine (NORVASC) 10 MG tablet Take 1 tablet (10 mg total) by mouth daily.   Ascorbic Acid (VITAMIN C) 1000 MG tablet Take 1,000 mg by mouth daily.   benzonatate (TESSALON) 100 MG capsule Take 1 capsule (100 mg total) by mouth 3 (three) times daily as needed.   BLACK COHOSH PO Take 1 tablet by mouth daily.   Blood Glucose Calibration (ACCU-CHEK AVIVA) SOLN Use as directed   Blood Glucose Monitoring Suppl (ACCU-CHEK AVIVA PLUS) w/Device KIT USE AS DIRECTED  TO CHECK BLOOD SUGAR EVERY DAY   Calcium Citrate-Vitamin D 315-250 MG-UNIT TABS Take 1 tablet by mouth daily.   cetirizine (ZYRTEC) 10 MG tablet Take 10 mg by mouth daily.   Cranberry 250 MG CAPS Take 1 capsule by mouth daily.   Cyanocobalamin (VITAMIN B-12) 1000 MCG/15ML LIQD Take by mouth.   doxycycline (VIBRA-TABS) 100 MG tablet Take 1 tablet (100 mg total) by mouth 2 (two) times daily.   DULoxetine (CYMBALTA) 30 MG capsule Take 3 capsules (90 mg total) by mouth daily.   ferrous sulfate 325 (65 FE) MG tablet Take 325 mg by mouth daily with breakfast.   fluticasone (FLONASE) 50 MCG/ACT nasal spray Place 2 sprays into both nostrils daily.   furosemide (LASIX) 40 MG tablet TAKE 1 TABLET DAILY AS NEEDED. ANNUAL APPOINTMENT DUE. MUST  SEE PROVIDER FOR FUTURE REFILLS.   glucose blood (ACCU-CHEK AVIVA PLUS) test strip 1 each by Other route 2 (two) times daily. Use to check blood sugars twice a day   HYDROcodone-acetaminophen (NORCO/VICODIN) 5-325 MG tablet Take 1 tablet by mouth every 12 (twelve) hours as needed.   Multiple Vitamins-Minerals (MULTIVITAMINS THER. W/MINERALS) TABS Take 1 tablet by mouth daily.   pantoprazole (PROTONIX) 40 MG tablet TAKE 1 TABLET(40 MG) BY MOUTH DAILY (Patient taking differently: Take 40 mg by mouth 2 (two) times daily.)   potassium chloride SA (KLOR-CON) 20 MEQ tablet TAKE 1 TABLET EVERY DAY   predniSONE (DELTASONE) 20 MG tablet Take 2 tablets (40 mg total) by mouth daily with breakfast.   traMADol (ULTRAM) 50 MG tablet TAKE 1 TABLET(50 MG) BY MOUTH FOUR TIMES DAILY   traZODone (DESYREL) 50 MG tablet TAKE 1 TABLET AT BEDTIME AS NEEDED FOR SLEEP. ANNUAL APPT DUE IN SEPT MUST SEE PROVIDER FOR FUTURE REFILLS   zinc gluconate 50 MG tablet Take 50 mg by mouth daily.   No facility-administered encounter medications on file as of 08/28/2020.    Have you had any problems recently with your health? Patient states that she has not been feeling good these last couple weeks from allergies bothering her really bad. States that she has a real bad cough and that when she coughs there are times that she is coughing up blood. Patient states that she is taking prednisone twice  a day. She states that she can't take antibiotics because she has C-dif.    Have you had any problems with your pharmacy? Patient states that she does not have any problems with getting her medication from the pharmacy. She does state that the cost of the [place] is a little expensive for her at this time. She states that she gets help from patient assistance but she still has to pay $50 and was unable to get it this month.  What issues or side effects are you having with your medications? Patient states that since she has been taking  prednisone she has been getting bruises on her arms and thighs. She would like to know would these bruise be a side effect from the prednisone  What would you like me to pass along to [facility] for them to help you with?  Patient states that she does not have any other concerns at this time, but believes she needs to make an appointment to see Dr. Sharlet Salina because she is not getting any better.  What can we do to take care of you better? Patient states she would like to know about getting some help over the weekend that would help her from going to the hospital or the ED. Told the patient I would check with Mendel Ryder. Star Rating Drugs: None ID  Ethelene Hal Clinical Pharmacist Assistant [phone]  Time spent:49

## 2020-09-04 ENCOUNTER — Ambulatory Visit: Payer: Medicare PPO | Admitting: Cardiology

## 2020-09-16 ENCOUNTER — Ambulatory Visit: Payer: Medicare HMO | Admitting: Cardiology

## 2020-09-17 ENCOUNTER — Telehealth: Payer: Self-pay | Admitting: Pharmacist

## 2020-09-17 ENCOUNTER — Encounter: Payer: Self-pay | Admitting: Internal Medicine

## 2020-09-17 NOTE — Progress Notes (Signed)
Chronic Care Management Pharmacy Assistant   Name: KETRA DUCHESNE  MRN: [id number] DOB: 26-Feb-1956   Medications: Outpatient Encounter Medications as of 09/17/2020  Medication Sig  . ACCU-CHEK SOFTCLIX LANCETS lancets CHECK BLOOD SUGARS TWICE DAILY  . albuterol (VENTOLIN HFA) 108 (90 Base) MCG/ACT inhaler INHALE 2 PUFFS INTO THE LUNGS EVERY 6 HOURS AS NEEDED FOR WHEEZING OR SHORTNESS OF BREATH  . Alcohol Swabs (B-D SINGLE USE SWABS REGULAR) PADS USE AS DIRECTED EVERY DAY  . amLODipine (NORVASC) 10 MG tablet Take 1 tablet (10 mg total) by mouth daily.  . Ascorbic Acid (VITAMIN C) 1000 MG tablet Take 1,000 mg by mouth daily.  . benzonatate (TESSALON) 100 MG capsule Take 1 capsule (100 mg total) by mouth 3 (three) times daily as needed.  Marland Kitchen BLACK COHOSH PO Take 1 tablet by mouth daily.  . Blood Glucose Calibration (ACCU-CHEK AVIVA) SOLN Use as directed  . Blood Glucose Monitoring Suppl (ACCU-CHEK AVIVA PLUS) w/Device KIT USE AS DIRECTED  TO CHECK BLOOD SUGAR EVERY DAY  . Calcium Citrate-Vitamin D 315-250 MG-UNIT TABS Take 1 tablet by mouth daily.  . cetirizine (ZYRTEC) 10 MG tablet Take 10 mg by mouth daily.  . Cranberry 250 MG CAPS Take 1 capsule by mouth daily.  . Cyanocobalamin (VITAMIN B-12) 1000 MCG/15ML LIQD Take by mouth.  . doxycycline (VIBRA-TABS) 100 MG tablet Take 1 tablet (100 mg total) by mouth 2 (two) times daily.  . DULoxetine (CYMBALTA) 30 MG capsule Take 3 capsules (90 mg total) by mouth daily.  . ferrous sulfate 325 (65 FE) MG tablet Take 325 mg by mouth daily with breakfast.  . fluticasone (FLONASE) 50 MCG/ACT nasal spray Place 2 sprays into both nostrils daily.  . furosemide (LASIX) 40 MG tablet TAKE 1 TABLET DAILY AS NEEDED. ANNUAL APPOINTMENT DUE. MUST SEE PROVIDER FOR FUTURE REFILLS.  Marland Kitchen glucose blood (ACCU-CHEK AVIVA PLUS) test strip 1 each by Other route 2 (two) times daily. Use to check blood sugars twice a day  . HYDROcodone-acetaminophen (NORCO/VICODIN) 5-325  MG tablet Take 1 tablet by mouth every 12 (twelve) hours as needed.  . Multiple Vitamins-Minerals (MULTIVITAMINS THER. W/MINERALS) TABS Take 1 tablet by mouth daily.  . pantoprazole (PROTONIX) 40 MG tablet TAKE 1 TABLET(40 MG) BY MOUTH DAILY (Patient taking differently: Take 40 mg by mouth 2 (two) times daily.)  . potassium chloride SA (KLOR-CON) 20 MEQ tablet TAKE 1 TABLET EVERY DAY  . predniSONE (DELTASONE) 20 MG tablet Take 2 tablets (40 mg total) by mouth daily with breakfast.  . traMADol (ULTRAM) 50 MG tablet TAKE 1 TABLET(50 MG) BY MOUTH FOUR TIMES DAILY  . traZODone (DESYREL) 50 MG tablet TAKE 1 TABLET AT BEDTIME AS NEEDED FOR SLEEP. ANNUAL APPT DUE IN SEPT MUST SEE PROVIDER FOR FUTURE REFILLS  . zinc gluconate 50 MG tablet Take 50 mg by mouth daily.   No facility-administered encounter medications on file as of 09/17/2020.    Pharmacist Review A call was made this morning to Ms. Mongeau to check and see how she has been felling. Last week patient stated that she had not been feeling well, allergies, and bad cough. Patient states that she has been feeling bad all weekend and that she still has a bad cough. She states that she is not in any pain, just fatigued and has a bad headache. Patient stated that she hasn't talked with Dr. Okey Dupre or any other provider about the issue but that she will call today and make an appointment to  see Dr. Sharlet Salina.  Ethelene Hal Clinical Pharmacist Assistant [phone]  Time spent:13

## 2020-09-19 ENCOUNTER — Telehealth (INDEPENDENT_AMBULATORY_CARE_PROVIDER_SITE_OTHER): Payer: Medicare HMO | Admitting: Internal Medicine

## 2020-09-19 ENCOUNTER — Encounter: Payer: Self-pay | Admitting: Internal Medicine

## 2020-09-19 DIAGNOSIS — I10 Essential (primary) hypertension: Secondary | ICD-10-CM

## 2020-09-19 DIAGNOSIS — R0609 Other forms of dyspnea: Secondary | ICD-10-CM

## 2020-09-19 DIAGNOSIS — R0600 Dyspnea, unspecified: Secondary | ICD-10-CM | POA: Diagnosis not present

## 2020-09-19 MED ORDER — PREDNISONE 20 MG PO TABS: 40.0000 mg | ORAL_TABLET | Freq: Every day | ORAL | 0 refills | Status: AC

## 2020-09-19 MED ORDER — BENZONATATE 100 MG PO CAPS: 100.0000 mg | ORAL_CAPSULE | Freq: Three times a day (TID) | ORAL | 0 refills | Status: DC | PRN

## 2020-09-19 NOTE — Progress Notes (Signed)
Virtual Visit via Video Note  I connected with [name] on 09/19/20 at  8:40 AM EDT by a video enabled telemedicine application and verified that I am speaking with the correct person using two identifiers.  The patient and the provider were at separate locations throughout the entire encounter. Patient location: home, Provider location: work   I discussed the limitations of evaluation and management by telemedicine and the availability of in person appointments. The patient expressed understanding and agreed to proceed. The patient and the provider were the only parties present for the visit unless noted in HPI below.  History of Present Illness: The patient is a 65 y.o. female with visit for ongoing cough and fatigue. Started a couple of weeks ago. Treated with prednisone back beginning of May and did get better with this. Newly ill about 1-2-3 weeks ago. Has some SOB and cough. Mostly SOB with coughing spells. Does have nasal congestion. Has been under a lot of stress due to selling home currently. Overall it is improving slightly but lingering longer than she thinks it should. Has tried tessalon perles and flonase and prednisone previously for this with some improvement  Observations/Objective: Appearance: normal, breathing appears normal during visit, minimal coughing during visit, no dyspnea during visit even with talking, casual grooming, abdomen does not appear distended, throat not well visualized, mental status is A and O times 3  Assessment and Plan: See problem oriented charting  Follow Up Instructions: refill tessalon perles, prednisone, check PFTs and labs  I discussed the assessment and treatment plan with the patient. The patient was provided an opportunity to ask questions and all were answered. The patient agreed with the plan and demonstrated an understanding of the instructions.   The patient was advised to call back or seek an in-person evaluation if the symptoms  worsen or if the condition fails to improve as anticipated.  Hoyt Koch, MD

## 2020-09-19 NOTE — Assessment & Plan Note (Signed)
Checking BNP, CBC, CMP and PFTs. Rx tessalon perles and prednisone. She did have scarring on lung x-ray in the past. Cardiac evaluation done June 2021 was without evidence for cardiac cause.

## 2020-09-22 ENCOUNTER — Ambulatory Visit: Payer: Medicare HMO | Admitting: Internal Medicine

## 2020-09-29 ENCOUNTER — Encounter: Payer: Self-pay | Admitting: Internal Medicine

## 2020-09-29 ENCOUNTER — Other Ambulatory Visit: Payer: Self-pay

## 2020-09-29 DIAGNOSIS — M797 Fibromyalgia: Secondary | ICD-10-CM

## 2020-09-29 NOTE — Telephone Encounter (Signed)
Patient is requesting a refill of the following medications: Requested Prescriptions   Pending Prescriptions Disp Refills   traMADol (ULTRAM) 50 MG tablet 120 tablet 5    Sig: TAKE 1 TABLET(50 MG) BY MOUTH FOUR TIMES DAILY    Date of patient request: 09/29/20  Last office visit: 09/19/20 Date of last refill: 03/18/20 Last refill amount: 120,5 Follow up time period per chart: n/a

## 2020-09-29 NOTE — Telephone Encounter (Signed)
Sent medication request to provider for review.

## 2020-09-30 MED ORDER — TRAMADOL HCL 50 MG PO TABS: ORAL_TABLET | ORAL | 5 refills | Status: DC

## 2020-10-01 MED ORDER — TRAMADOL HCL 50 MG PO TABS: ORAL_TABLET | ORAL | 5 refills | Status: DC

## 2020-10-01 NOTE — Telephone Encounter (Signed)
Called pt to see if she received her medication, no answer and left vm to call back.

## 2020-10-01 NOTE — Telephone Encounter (Signed)
Can you call [facility] and cancel script to there?

## 2020-10-02 NOTE — Telephone Encounter (Signed)
Prescription canceled with [employer].    Message sent via mychart informing pt of prescription at requested [place].

## 2020-10-13 ENCOUNTER — Encounter: Payer: Self-pay | Admitting: Internal Medicine

## 2020-10-13 DIAGNOSIS — R300 Dysuria: Secondary | ICD-10-CM

## 2020-10-13 DIAGNOSIS — R350 Frequency of micturition: Secondary | ICD-10-CM

## 2020-10-15 ENCOUNTER — Other Ambulatory Visit (INDEPENDENT_AMBULATORY_CARE_PROVIDER_SITE_OTHER): Payer: Medicare HMO

## 2020-10-15 ENCOUNTER — Other Ambulatory Visit: Payer: Self-pay

## 2020-10-15 DIAGNOSIS — R300 Dysuria: Secondary | ICD-10-CM | POA: Diagnosis not present

## 2020-10-15 DIAGNOSIS — R350 Frequency of micturition: Secondary | ICD-10-CM

## 2020-10-15 DIAGNOSIS — R0609 Other forms of dyspnea: Secondary | ICD-10-CM

## 2020-10-15 DIAGNOSIS — R0600 Dyspnea, unspecified: Secondary | ICD-10-CM

## 2020-10-15 DIAGNOSIS — I10 Essential (primary) hypertension: Secondary | ICD-10-CM

## 2020-10-15 NOTE — Telephone Encounter (Signed)
Pt notified of PCP response.  States she will go to [facility] location to drop off urine sample. Pt also notified that she can have blood work drawn at that time.  Pt verb understanding.

## 2020-10-16 LAB — COMPREHENSIVE METABOLIC PANEL
ALT: 17 U/L (ref 0–35)
AST: 23 U/L (ref 0–37)
Albumin: 3.6 g/dL (ref 3.5–5.2)
Alkaline Phosphatase: 121 U/L — ABNORMAL HIGH (ref 39–117)
BUN: 19 mg/dL (ref 6–23)
CO2: 23 mEq/L (ref 19–32)
Calcium: 8.3 mg/dL — ABNORMAL LOW (ref 8.4–10.5)
Chloride: 113 mEq/L — ABNORMAL HIGH (ref 96–112)
Creatinine, Ser: 0.82 mg/dL (ref 0.40–1.20)
GFR: 74.23 mL/min (ref >60.00)
Glucose, Bld: 79 mg/dL (ref 70–99)
Potassium: 3.6 mEq/L (ref 3.5–5.1)
Sodium: 142 mEq/L (ref 135–145)
Total Bilirubin: 0.4 mg/dL (ref 0.2–1.2)
Total Protein: 6.1 g/dL (ref 6.0–8.3)

## 2020-10-16 LAB — URINALYSIS, ROUTINE W REFLEX MICROSCOPIC
Bilirubin Urine: NEGATIVE
Ketones, ur: NEGATIVE
Nitrite: NEGATIVE
Specific Gravity, Urine: 1.02 (ref 1.000–1.030)
Urine Glucose: NEGATIVE
Urobilinogen, UA: 0.2 (ref 0.0–1.0)
pH: 6 (ref 5.0–8.0)

## 2020-10-16 LAB — TSH: TSH: 0.55 u[IU]/mL (ref 0.35–5.50)

## 2020-10-16 LAB — URINE CULTURE

## 2020-10-16 LAB — VITAMIN B12: Vitamin B-12: 1437 pg/mL — ABNORMAL HIGH (ref 211–911)

## 2020-10-16 LAB — VITAMIN D 25 HYDROXY (VIT D DEFICIENCY, FRACTURES): VITD: <7 ng/mL — ABNORMAL LOW (ref 30.00–100.00)

## 2020-10-16 LAB — BRAIN NATRIURETIC PEPTIDE: Pro B Natriuretic peptide (BNP): 149 pg/mL — ABNORMAL HIGH (ref 0.0–100.0)

## 2020-10-16 NOTE — Progress Notes (Signed)
    Chronic Care Management Pharmacy Assistant   Name: [name]  MRN: [id number] DOB: [birth year]   Reason for Encounter: Chart Review    Medications: Outpatient Encounter Medications as of 09/17/2020  Medication Sig   ACCU-CHEK SOFTCLIX LANCETS lancets CHECK BLOOD SUGARS TWICE DAILY   albuterol (VENTOLIN HFA) 108 (90 Base) MCG/ACT inhaler INHALE 2 PUFFS INTO THE LUNGS EVERY 6 HOURS AS NEEDED FOR WHEEZING OR SHORTNESS OF BREATH   Alcohol Swabs (B-D SINGLE USE SWABS REGULAR) PADS USE AS DIRECTED EVERY DAY   amLODipine (NORVASC) 10 MG tablet Take 1 tablet (10 mg total) by mouth daily.   Ascorbic Acid (VITAMIN C) 1000 MG tablet Take 1,000 mg by mouth daily.   BLACK COHOSH PO Take 1 tablet by mouth daily.   Blood Glucose Calibration (ACCU-CHEK AVIVA) SOLN Use as directed   Blood Glucose Monitoring Suppl (ACCU-CHEK AVIVA PLUS) w/Device KIT USE AS DIRECTED  TO CHECK BLOOD SUGAR EVERY DAY   Calcium Citrate-Vitamin D 315-250 MG-UNIT TABS Take 1 tablet by mouth daily.   cetirizine (ZYRTEC) 10 MG tablet Take 10 mg by mouth daily.   Cranberry 250 MG CAPS Take 1 capsule by mouth daily.   DULoxetine (CYMBALTA) 30 MG capsule Take 3 capsules (90 mg total) by mouth daily.   ferrous sulfate 325 (65 FE) MG tablet Take 325 mg by mouth daily with breakfast.   furosemide (LASIX) 40 MG tablet TAKE 1 TABLET DAILY AS NEEDED. ANNUAL APPOINTMENT DUE. MUST SEE PROVIDER FOR FUTURE REFILLS.   glucose blood (ACCU-CHEK AVIVA PLUS) test strip 1 each by Other route 2 (two) times daily. Use to check blood sugars twice a day   Multiple Vitamins-Minerals (MULTIVITAMINS THER. W/MINERALS) TABS Take 1 tablet by mouth daily.   pantoprazole (PROTONIX) 40 MG tablet TAKE 1 TABLET(40 MG) BY MOUTH DAILY (Patient taking differently: Take 40 mg by mouth 2 (two) times daily.)   potassium chloride SA (KLOR-CON) 20 MEQ tablet TAKE 1 TABLET EVERY DAY   traZODone (DESYREL) 50 MG tablet TAKE 1 TABLET AT BEDTIME AS NEEDED FOR  SLEEP. ANNUAL APPT DUE IN SEPT MUST SEE PROVIDER FOR FUTURE REFILLS   zinc gluconate 50 MG tablet Take 50 mg by mouth daily.   [DISCONTINUED] benzonatate (TESSALON) 100 MG capsule Take 1 capsule (100 mg total) by mouth 3 (three) times daily as needed.   [DISCONTINUED] Cyanocobalamin (VITAMIN B-12) 1000 MCG/15ML LIQD Take by mouth.   [DISCONTINUED] doxycycline (VIBRA-TABS) 100 MG tablet Take 1 tablet (100 mg total) by mouth 2 (two) times daily.   [DISCONTINUED] fluticasone (FLONASE) 50 MCG/ACT nasal spray Place 2 sprays into both nostrils daily.   [DISCONTINUED] HYDROcodone-acetaminophen (NORCO/VICODIN) 5-325 MG tablet Take 1 tablet by mouth every 12 (twelve) hours as needed.   [DISCONTINUED] predniSONE (DELTASONE) 20 MG tablet Take 2 tablets (40 mg total) by mouth daily with breakfast.   [DISCONTINUED] traMADol (ULTRAM) 50 MG tablet TAKE 1 TABLET(50 MG) BY MOUTH FOUR TIMES DAILY   No facility-administered encounter medications on file as of 09/17/2020.   Pharmacist Review  Reviewed chart for medication changes and adherence.  Recent OV, Consult or Hospital visit: 09/19/20 Video visit with Dr. Sharlet Salina Recent medication changes indicated: cyanocobalamin, doxycycline, fluticasone and hydrocodone discontinued  No gaps in adherence identified. Patient has follow up scheduled with pharmacy team. No further action required.   [place] Pharmacist Assistant [phone]   Time spent:7

## 2020-10-17 ENCOUNTER — Other Ambulatory Visit: Payer: Self-pay | Admitting: Internal Medicine

## 2020-10-17 MED ORDER — VITAMIN D (ERGOCALCIFEROL) 1.25 MG (50000 UNIT) PO CAPS: 50000.0000 [IU] | ORAL_CAPSULE | ORAL | 0 refills | Status: DC

## 2020-10-21 ENCOUNTER — Telehealth: Payer: Medicare HMO

## 2020-10-21 ENCOUNTER — Ambulatory Visit: Payer: Medicare PPO

## 2020-10-21 ENCOUNTER — Other Ambulatory Visit: Payer: Medicare PPO

## 2020-10-28 ENCOUNTER — Other Ambulatory Visit: Payer: Self-pay | Admitting: Internal Medicine

## 2020-10-28 ENCOUNTER — Encounter: Payer: Self-pay | Admitting: Internal Medicine

## 2020-10-28 MED ORDER — PREDNISONE 20 MG PO TABS: 40.0000 mg | ORAL_TABLET | Freq: Every day | ORAL | 0 refills | Status: AC

## 2020-11-05 ENCOUNTER — Telehealth: Payer: Self-pay | Admitting: Pharmacist

## 2020-11-05 NOTE — Progress Notes (Signed)
Chronic Care Management Pharmacy Assistant   Name: [name]  MRN: [id number] DOB: 11-23-1955   Reason for Encounter: Disease State   Conditions to be addressed/monitored: General   Recent office visits: 09/19/20 Dr. Sharlet Salina Internal Medicine Dyspnea on exertion refill tessalon perles, prednisone, check PFTs and labs  Recent consult visits:  None ID  Hospital visits:  None in previous 6 months  Medications: Outpatient Encounter Medications as of 11/05/2020  Medication Sig   ACCU-CHEK SOFTCLIX LANCETS lancets CHECK BLOOD SUGARS TWICE DAILY   albuterol (VENTOLIN HFA) 108 (90 Base) MCG/ACT inhaler INHALE 2 PUFFS INTO THE LUNGS EVERY 6 HOURS AS NEEDED FOR WHEEZING OR SHORTNESS OF BREATH   Alcohol Swabs (B-D SINGLE USE SWABS REGULAR) PADS USE AS DIRECTED EVERY DAY   amLODipine (NORVASC) 10 MG tablet Take 1 tablet (10 mg total) by mouth daily.   Ascorbic Acid (VITAMIN C) 1000 MG tablet Take 1,000 mg by mouth daily.   benzonatate (TESSALON) 100 MG capsule Take 1 capsule (100 mg total) by mouth 3 (three) times daily as needed.   BLACK COHOSH PO Take 1 tablet by mouth daily.   Blood Glucose Calibration (ACCU-CHEK AVIVA) SOLN Use as directed   Blood Glucose Monitoring Suppl (ACCU-CHEK AVIVA PLUS) w/Device KIT USE AS DIRECTED  TO CHECK BLOOD SUGAR EVERY DAY   Calcium Citrate-Vitamin D 315-250 MG-UNIT TABS Take 1 tablet by mouth daily.   cetirizine (ZYRTEC) 10 MG tablet Take 10 mg by mouth daily.   Cranberry 250 MG CAPS Take 1 capsule by mouth daily.   DULoxetine (CYMBALTA) 30 MG capsule Take 3 capsules (90 mg total) by mouth daily.   ferrous sulfate 325 (65 FE) MG tablet Take 325 mg by mouth daily with breakfast.   furosemide (LASIX) 40 MG tablet TAKE 1 TABLET DAILY AS NEEDED. ANNUAL APPOINTMENT DUE. MUST SEE PROVIDER FOR FUTURE REFILLS.   glucose blood (ACCU-CHEK AVIVA PLUS) test strip 1 each by Other route 2 (two) times daily. Use to check blood sugars twice a day    Multiple Vitamins-Minerals (MULTIVITAMINS THER. W/MINERALS) TABS Take 1 tablet by mouth daily.   pantoprazole (PROTONIX) 40 MG tablet TAKE 1 TABLET(40 MG) BY MOUTH DAILY (Patient taking differently: Take 40 mg by mouth 2 (two) times daily.)   potassium chloride SA (KLOR-CON) 20 MEQ tablet TAKE 1 TABLET EVERY DAY   traMADol (ULTRAM) 50 MG tablet TAKE 1 TABLET(50 MG) BY MOUTH FOUR TIMES DAILY   traZODone (DESYREL) 50 MG tablet TAKE 1 TABLET AT BEDTIME AS NEEDED FOR SLEEP. ANNUAL APPT DUE IN SEPT MUST SEE PROVIDER FOR FUTURE REFILLS   Vitamin D, Ergocalciferol, (DRISDOL) 1.25 MG (50000 UNIT) CAPS capsule Take 1 capsule (50,000 Units total) by mouth every 7 (seven) days.   zinc gluconate 50 MG tablet Take 50 mg by mouth daily.   No facility-administered encounter medications on file as of 11/05/2020.    Pharmacist Review  Have you had any problems recently with your health? Patient states that she is doing good except for the runny nose and sore throat, which seems to be an reoccurring issue for her for the last few months. Patient states that she is taking prednisone but that after she finishes she will be ok for a few days and then the symptoms come right back  Have you had any problems with your pharmacy? Patient states that she does not have any problems with getting medications or the cost of medications from the pharmacy  What issues or side effects  are you having with your medications? Patient states that she does not have any side effects from medications   What would you like me to pass along to [facility] for them to help you with?  Patient states not at this time but she does has to make any appointment to see Dr Quay Burow because her issues is not better and it's getting kind of annoying   What can we do to take care of you better?  Patient states not at this time   Star Rating Drugs: None ID  [place] Pharmacist Assistant [phone]   Time  spent:25

## 2020-11-11 ENCOUNTER — Ambulatory Visit (INDEPENDENT_AMBULATORY_CARE_PROVIDER_SITE_OTHER): Payer: Medicare HMO | Admitting: Pharmacist

## 2020-11-11 ENCOUNTER — Other Ambulatory Visit: Payer: Self-pay

## 2020-11-11 DIAGNOSIS — J019 Acute sinusitis, unspecified: Secondary | ICD-10-CM

## 2020-11-11 DIAGNOSIS — I10 Essential (primary) hypertension: Secondary | ICD-10-CM

## 2020-11-11 DIAGNOSIS — F4323 Adjustment disorder with mixed anxiety and depressed mood: Secondary | ICD-10-CM | POA: Diagnosis not present

## 2020-11-11 NOTE — Progress Notes (Signed)
Chronic Care Management Pharmacy Note  11/12/2020 Name:  [name] MRN:  [id number] DOB:  [birth year]  Summary: -Pt is still experiencing cough, congestion, shortness of breath, fatigue. Symptoms initially improved with prednisone but returned within a few days of completing course. Home COVID test 7/24 was negative (3rd negative home test in last 3-4 months). She is taking Claritin with little improvement. -Pt did start taking Vitamin D 50,000 IU weekly  Recommendations/Changes made from today's visit: -Advised trial of Allegra  -Consider COVID PCR test -Defer to PCP   Subjective: [name] is an 65 y.o. year old female who is a primary patient of Hoyt Koch, MD.  The CCM team was consulted for assistance with disease management and care coordination needs.    Engaged with patient by telephone for follow up visit in response to provider referral for pharmacy case management and/or care coordination services.   Consent to Services:  The patient was given information about Chronic Care Management services, agreed to services, and gave verbal consent prior to initiation of services.  Please see initial visit note for detailed documentation.   Patient Care Team: Hoyt Koch, MD as PCP - General (Internal Medicine) Charlton Haws, [facility] as Pharmacist (Pharmacist)  Recent office visits: 09/19/20 Dr. Sharlet Salina VV: Dyspnea on exertion; refill tessalon perles, prednisone, check PFTs and labs. Rx'd Vitamin D 50K IU weekly.  Recent consult visits: 08/12/20 Dr Grandville Silos (hand surgery): f/u hand fracture.  Hospital visits: None in previous 6 months   Objective:  Lab Results  Component Value Date   CREATININE 0.82 10/15/2020   BUN 19 10/15/2020   GFR 74.23 10/15/2020   GFRNONAA >60 02/22/2020   GFRAA 95 05/01/2019   NA 142 10/15/2020   K 3.6 10/15/2020   CALCIUM 8.3 (L) 10/15/2020   CO2 23 10/15/2020   GLUCOSE 79 10/15/2020    Lab  Results  Component Value Date/Time   HGBA1C 5.5 01/02/2019 02:03 PM   HGBA1C 5.2 12/11/2015 02:23 PM   GFR 74.23 10/15/2020 04:15 PM   GFR 84.06 08/12/2020 02:41 PM   MICROALBUR <0.7 01/02/2019 02:03 PM    Last diabetic Eye exam:  Lab Results  Component Value Date/Time   HMDIABEYEEXA No Retinopathy 12/09/2015 12:00 AM    Last diabetic Foot exam: No results found for: HMDIABFOOTEX   Lab Results  Component Value Date   CHOL 146 01/02/2019   HDL 73.40 01/02/2019   LDLCALC 60 01/02/2019   TRIG 66.0 01/02/2019   CHOLHDL 2 01/02/2019    Hepatic Function Latest Ref Rng & Units 10/15/2020 08/12/2020 02/22/2020  Total Protein 6.0 - 8.3 g/dL 6.1 6.6 6.5  Albumin 3.5 - 5.2 g/dL 3.6 3.6 3.6  AST 0 - 37 U/L 23 20 33  ALT 0 - 35 U/L $Remo'17 22 25  'fTUMA$ Alk Phosphatase 39 - 117 U/L 121(H) 127(H) 116  Total Bilirubin 0.2 - 1.2 mg/dL 0.4 0.3 0.3  Bilirubin, Direct 0.0 - 0.3 mg/dL - - -    Lab Results  Component Value Date/Time   TSH 0.55 10/15/2020 04:15 PM   TSH 0.98 01/02/2019 02:03 PM    CBC Latest Ref Rng & Units 08/12/2020 02/22/2020 10/11/2019  WBC 4.0 - 10.5 K/uL 8.8 6.1 6.3  Hemoglobin 12.0 - 15.0 g/dL 12.4 12.5 12.1  Hematocrit 36.0 - 46.0 % 38.2 39.1 37.5  Platelets 150.0 - 400.0 K/uL 217.0 198 191.0    Lab Results  Component Value Date/Time   VD25OH <7.00 (L) 10/15/2020 04:15  PM   VD25OH 12 (L) 09/14/2019 04:41 PM   VD25OH <7.00 (L) 09/15/2017 03:43 PM    Clinical ASCVD: No  The 10-year ASCVD risk score Mikey Bussing DC Jr., et al., 2013) is: 9.6%   Values used to calculate the score:     Age: 65 years     Sex: Female     Is Non-Hispanic African American: Yes     Diabetic: No     Tobacco smoker: No     Systolic Blood Pressure: 438 mmHg     Is BP treated: Yes     HDL Cholesterol: 73.4 mg/dL     Total Cholesterol: 146 mg/dL    Depression screen [facility] 2/9 02/28/2020 10/26/2018 05/19/2018  Decreased Interest 3 2 1   Down, Depressed, Hopeless 2 1 3   PHQ - 2 Score 5 3 4   Altered sleeping  3 2 3   Tired, decreased energy 3 1 3   Change in appetite 3 1 3   Feeling bad or failure about yourself  0 1 2  Trouble concentrating 0 1 2  Moving slowly or fidgety/restless 0 0 1  Suicidal thoughts 0 0 0  PHQ-9 Score 14 9 18   Difficult doing work/chores Somewhat difficult - -  Some recent data might be hidden     Social History   Tobacco Use  Smoking Status Former   Packs/day: 0.50   Years: 10.00   Pack years: 5.00   Types: Cigarettes   Quit date: 04/19/2009   Years since quitting: 11.5  Smokeless Tobacco Never   BP Readings from Last 3 Encounters:  05/08/20 (!) 144/82  05/01/20 130/74  04/30/20 132/64   Pulse Readings from Last 3 Encounters:  05/08/20 75  05/01/20 72  04/30/20 64   Wt Readings from Last 3 Encounters:  05/01/20 235 lb 6.4 oz (106.8 kg)  02/28/20 252 lb (114.3 kg)  02/06/20 273 lb (123.8 kg)   BMI Readings from Last 3 Encounters:  05/08/20 37.99 kg/m  05/01/20 37.99 kg/m  02/28/20 40.67 kg/m    Assessment/Interventions: Review of patient past medical history, allergies, medications, health status, including review of consultants reports, laboratory and other test data, was performed as part of comprehensive evaluation and provision of chronic care management services.   SDOH:  (Social Determinants of Health) assessments and interventions performed: Yes  SDOH Screenings   Alcohol Screen: Low Risk    Last Alcohol Screening Score (AUDIT): 1  Depression (PHQ2-9): Medium Risk   PHQ-2 Score: 14  Financial Resource Strain: Medium Risk   Difficulty of Paying Living Expenses: Somewhat hard  Food Insecurity: Food Insecurity Present   Worried About Charity fundraiser in the Last Year: Sometimes true   Arboriculturist in the Last Year: Sometimes true  Housing: Low Risk    Last Housing Risk Score: 0  Physical Activity: Unknown   Days of Exercise per Week: 0 days   Minutes of Exercise per Session: Not on file  Social Connections: Unknown    Frequency of Communication with Friends and Family: More than three times a week   Frequency of Social Gatherings with Friends and Family: Once a week   Attends Religious Services: Patient refused   Active Member of Clubs or Organizations: Yes   Attends Archivist Meetings: 1 to 4 times per year   Marital Status: Widowed  Stress: Stress Concern Present   Feeling of Stress : To some extent  Tobacco Use: Medium Risk   Smoking Tobacco Use: Former  Smokeless Tobacco Use: Never  Transportation Needs: Public librarian (Medical): Yes   Lack of Transportation (Non-Medical): No    CCM Care Plan  Allergies  Allergen Reactions   Carafate [Sucralfate] Hives, Itching and Other (See Comments)    Angioedema      Sulfonamide Derivatives Rash    Syncope    Medications Reviewed Today     Reviewed by Hoyt Koch, MD (Physician) on 09/19/20 at 0909  Med List Status: <None>   Medication Order Taking? Sig Documenting Provider Last Dose Status Informant  ACCU-CHEK SOFTCLIX LANCETS lancets [id number] No CHECK BLOOD SUGARS TWICE DAILY Hoyt Koch, MD Taking Active   albuterol (VENTOLIN HFA) 108 (90 Base) MCG/ACT inhaler [id number] No INHALE 2 PUFFS INTO THE LUNGS EVERY 6 HOURS AS NEEDED FOR WHEEZING OR SHORTNESS OF [place] Brunette, MD Taking Active   Alcohol Swabs (B-D SINGLE USE SWABS REGULAR) PADS [id number] No USE AS DIRECTED EVERY DAY Hoyt Koch, MD Taking Active   amLODipine (NORVASC) 10 MG tablet [id number] No Take 1 tablet (10 mg total) by mouth daily. Hoyt Koch, MD Taking Active   Ascorbic Acid (VITAMIN C) 1000 MG tablet [id number] No Take 1,000 mg by mouth daily. [provider] Taking Active   benzonatate (TESSALON) 100 MG capsule [id number]  Take 1 capsule (100 mg total) by mouth 3 (three) times daily as needed. Hoyt Koch, MD  Active   BLACK COHOSH PO [id number] No Take 1  tablet by mouth daily. [provider] Taking Active Self  Blood Glucose Calibration (ACCU-CHEK AVIVA) SOLN [id number] No Use as directed Hoyt Koch, MD Taking Active Self  Blood Glucose Monitoring Suppl (ACCU-CHEK AVIVA PLUS) w/Device KIT [id number] No USE AS DIRECTED  TO CHECK BLOOD SUGAR EVERY DAY Hoyt Koch, MD Taking Active   Calcium Citrate-Vitamin D 315-250 MG-UNIT TABS [id number] No Take 1 tablet by mouth daily. [provider] Taking Active   cetirizine (ZYRTEC) 10 MG tablet [id number] No Take 10 mg by mouth daily. [provider] Taking Active   Cranberry 250 MG CAPS [id number] No Take 1 capsule by mouth daily. [provider] Taking Active   DULoxetine (CYMBALTA) 30 MG capsule [id number] No Take 3 capsules (90 mg total) by mouth daily. Hoyt Koch, MD Taking Active   ferrous sulfate 325 (65 FE) MG tablet [id number] No Take 325 mg by mouth daily with breakfast. [provider] Taking Active   furosemide (LASIX) 40 MG tablet [id number] No TAKE 1 TABLET DAILY AS NEEDED. ANNUAL APPOINTMENT DUE. MUST SEE PROVIDER FOR FUTURE REFILLS. Hoyt Koch, MD Taking Active   glucose blood (ACCU-CHEK AVIVA PLUS) test strip [id number] No 1 each by Other route 2 (two) times daily. Use to check blood sugars twice a day Hoyt Koch, MD Taking Active   Multiple Vitamins-Minerals (MULTIVITAMINS THER. W/MINERALS) TABS [id number] No Take 1 tablet by mouth daily. [provider] Taking Active Self  pantoprazole (PROTONIX) 40 MG tablet [id number] No TAKE 1 TABLET(40 MG) BY MOUTH DAILY  Patient taking differently: Take 40 mg by mouth 2 (two) times daily.   Hoyt Koch, MD Taking Active   potassium chloride SA (KLOR-CON) 20 MEQ tablet [id number] No TAKE 1 TABLET EVERY DAY Hoyt Koch, MD Taking Active   predniSONE (DELTASONE) 20 MG tablet [id number] Yes Take 2 tablets (40 mg total) by mouth daily with  breakfast for 10 days. Hoyt Koch, MD  Active  traMADol (ULTRAM) 50 MG tablet [id number] No TAKE 1 TABLET(50 MG) BY MOUTH FOUR TIMES DAILY Hoyt Koch, MD Taking Active   traZODone (DESYREL) 50 MG tablet [id number] No TAKE 1 TABLET AT BEDTIME AS NEEDED FOR SLEEP. ANNUAL APPT DUE IN SEPT MUST SEE PROVIDER FOR FUTURE REFILLS Biagio Borg, MD Taking Active   zinc gluconate 50 MG tablet [id number] No Take 50 mg by mouth daily. [provider] Taking Active             Patient Active Problem List   Diagnosis Date Noted   Chest pain on breathing 08/11/2020   Closed fracture of left distal radius 05/01/2020   Recurrent Clostridioides difficile infection 02/28/2020   Pyelonephritis 02/28/2020   Abnormal urine odor 08/10/2019   Gastrointestinal complaint 07/05/2019   Muscle strain of gluteal region, right, initial encounter 05/11/2019   Dyspnea on exertion 03/13/2019   Rapid heartbeat 65/06/5463   Systolic ejection murmur 68/03/7516   Leg swelling 10/09/2018   Insomnia 05/19/2018   Frequent refractory urinary tract infections 05/05/2018   Right leg pain 12/16/2017   History of deep vein thrombosis (DVT) of lower extremity 12/12/2017   Cavovarus deformity of foot, acquired, unspecified laterality 04/07/2017   S/P gastric bypass 08/18/2016   Muscle cramps 07/09/2016   Adjustment disorder with mixed anxiety and depressed mood 03/05/2016   Fatigue 03/05/2016   Cervical disc disorder with radiculopathy of cervical region 02/27/2016   Degenerative arthritis of right knee 02/27/2016   Right knee pain 02/24/2016   Left shoulder pain 02/24/2016   Routine general medical examination at a health care facility 12/12/2015   Schatzki's ring    Lumbar radiculopathy 03/05/2015   Peripheral neuropathy 01/17/2015   Chest pain with moderate risk for cardiac etiology 05/21/2013   Fibromyalgia 02/21/2011   Essential hypertension 12/04/2006   Hx of diabetes mellitus  10/20/2006   Morbid obesity ([place]) 10/20/2006   MDD (major depressive disorder) ([place]) 10/20/2006   OBSTRUCTIVE SLEEP APNEA 10/20/2006   OSTEOARTHRITIS 10/20/2006    Immunization History  Administered Date(s) Administered   Influenza Whole 02/01/2007   Influenza,inj,Quad PF,6+ Mos 12/16/2017   Influenza-Unspecified 02/19/2013, 05/20/2014, 02/01/2016, 12/17/2016, 02/19/2020   Moderna Sars-Covid-2 Vaccination 05/13/2019, 06/10/2019, 02/19/2020, 08/12/2020   Pneumococcal Conjugate-13 01/02/2019   Td 11/12/1997   Tdap 08/07/2014   Zoster Recombinat (Shingrix) 10/11/2019    Conditions to be addressed/monitored:  Hypertension, Depression, and Anxiety, Prediabetes  Care Plan : [place]  Updates made by Charlton Haws, [place] since 11/12/2020 12:00 AM     Problem: Hypertension, Depression, and Anxiety, Prediabetes   Priority: High     Long-Range Goal: Disease management   Start Date: 11/12/2020  Expected End Date: 11/12/2021  This Visit's Progress: On track  Priority: High  Note:   Current Barriers:  Unable to independently monitor therapeutic efficacy  Pharmacist Clinical Goal(s):  Patient will achieve adherence to monitoring guidelines and medication adherence to achieve therapeutic efficacy through collaboration with PharmD and provider.   Interventions: 1:1 collaboration with Hoyt Koch, MD regarding development and update of comprehensive plan of care as evidenced by provider attestation and co-signature Inter-disciplinary care team collaboration (see longitudinal plan of care) Comprehensive medication review performed; medication list updated in electronic medical record  Hypertension    BP goal is:  <130/80 Patient checks BP at home 1-2x per week Patient home BP readings are ranging: 117/80-120/80s   Patient has failed these meds in the past: metoprolol, lisinopril-HCTZ Patient is currently controlled  on the following  medications: Amlodipine 10 mg daily  Furosemide 40 mg daily PRN - not taking Klor-Con 20 mEq daily   We discussed: BP goals; benefits of medications; pt does not take furosemide daily anymore, and has not taken in several months -pt does take KCl even when not taking Lasix due to hx of Gastric bypass, trouble with maintaining K levels. Mainly taking for leg cramps now. Last few potassium levels were WNL so advised pt to continue the same regimen   Plan: Continue current medications   Cold symptoms  Pt has had cold/URI symptoms on and off for past 3-4 months. She just completed a week-long course of prednisone, symptoms initially improved but returned within a few days. Currently she is just taking Claritin and PRN Flonase.  -Home COVID test 7/24 was negative. This is 3rd negative home test in past 3-4 months  Plan:  -Advised trial of Allegra -Consider COVID PCR test   Patient Goals/Self-Care Activities Patient will:  - take medications as prescribed focus on medication adherence by routine -F/U with PCP regarding recurrent cold/respiratory symptoms -Try Allegra for symptoms       Medication Assistance: None required.  Patient affirms current coverage meets needs.  Compliance/Adherence/Medication fill history: Care Gaps: DEXA scan - scheduled 03/2021 Mammogram - Scheduled for 11/2020 Colonoscopy (due 04/20/19) Shingrix dose 2 (due 12/06/19) PPSV23 (due 01/02/20)  Star-Rating Drugs: None  Patient's preferred pharmacy is:  [id number] - [address], [address] [place] [address] Phone: [phone] Fax: [phone]  [facility], [address], [place] [address] Phone: [phone] Fax: [phone]  [facility] ([facility] - [place], [address] [place] [address] Phone: [phone] Fax: [phone]  Uses pill box? No - prefers bottles Pt endorses 100% compliance  We discussed: Current pharmacy is preferred with insurance plan and patient is satisfied with pharmacy services Patient decided to: Continue current medication management strategy  Care Plan and Follow Up Patient Decision:  Patient agrees to Care Plan and Follow-up.  Plan: Telephone follow up appointment with care management team member scheduled for:  3 months  Charlene Brooke, PharmD, [place], CPP Clinical Pharmacist [facility] at [facility] [phone]

## 2020-11-12 NOTE — Patient Instructions (Signed)
Visit Information  Phone number for Pharmacist: [phone]   Goals Addressed             This Visit's Progress    Manage My Medicine       Timeframe:  Long-Range Goal Priority:  Medium Start Date:       11/11/20                      Expected End Date:     11/11/21                  Follow Up Date: Oct 2022   - call for medicine refill 2 or 3 days before it runs out - call if I am sick and can't take my medicine - keep a list of all the medicines I take; vitamins and herbals too    Why is this important?   These steps will help you keep on track with your medicines.   Notes:         Patient verbalizes understanding of instructions provided today and agrees to view in [place].  Telephone follow up appointment with pharmacy team member scheduled for: 3 months  Charlene Brooke, PharmD, [place], CPP Clinical Pharmacist [facility] at [facility] [phone]

## 2020-11-27 ENCOUNTER — Ambulatory Visit: Payer: Medicare HMO | Admitting: Internal Medicine

## 2020-11-27 ENCOUNTER — Encounter: Payer: Self-pay | Admitting: Internal Medicine

## 2020-11-28 NOTE — Telephone Encounter (Signed)
LVM instructions for pt to call back or respond to PCP mychart message to get appt rescheduled.

## 2020-11-28 NOTE — Telephone Encounter (Signed)
Can you reach out to her today and apologize on my behalf and see if she does want to reschedule I can accommodate whenever she is free okay to overbook for preferred day/time.

## 2020-12-01 ENCOUNTER — Encounter: Payer: Medicare HMO | Admitting: Internal Medicine

## 2020-12-01 NOTE — Telephone Encounter (Signed)
Pt states she saw PCP message. Appt made for 8/16 at 2p

## 2020-12-02 ENCOUNTER — Encounter: Payer: Self-pay | Admitting: Internal Medicine

## 2020-12-02 ENCOUNTER — Other Ambulatory Visit: Payer: Self-pay

## 2020-12-02 ENCOUNTER — Ambulatory Visit (INDEPENDENT_AMBULATORY_CARE_PROVIDER_SITE_OTHER): Payer: Medicare HMO | Admitting: Internal Medicine

## 2020-12-02 VITALS — BP 126/90 | HR 63 | Temp 98.1°F | Resp 18 | Ht 66.0 in | Wt 222.2 lb

## 2020-12-02 DIAGNOSIS — R0600 Dyspnea, unspecified: Secondary | ICD-10-CM | POA: Diagnosis not present

## 2020-12-02 DIAGNOSIS — F4323 Adjustment disorder with mixed anxiety and depressed mood: Secondary | ICD-10-CM

## 2020-12-02 DIAGNOSIS — R0609 Other forms of dyspnea: Secondary | ICD-10-CM

## 2020-12-02 MED ORDER — PAROXETINE HCL 10 MG PO TABS: 10.0000 mg | ORAL_TABLET | Freq: Every day | ORAL | 3 refills | Status: DC

## 2020-12-02 MED ORDER — FLUTICASONE-SALMETEROL 100-50 MCG/ACT IN AEPB: 1.0000 | INHALATION_SPRAY | Freq: Two times a day (BID) | RESPIRATORY_TRACT | 3 refills | Status: DC

## 2020-12-02 NOTE — Progress Notes (Signed)
   Subjective:   Patient ID: [name], female    DOB: 05/08/1955, 65 y.o.   MRN: [id number]  HPI The patient is a 65 YO female coming in for cough and SOB and fatigue going on since end of April. 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 Has put this on hold due to mood and health. She is having SOB with any activity and gets tired easily. Wakes rested. Denies chest pain at rest or exertion. Overall stable. Has history of asthma and prior smoking. Has scoliosis in thoracic region.   Review of Systems  Constitutional:  Positive for activity change, appetite change and fatigue.  HENT: Negative.    Eyes: Negative.   Respiratory:  Positive for cough and shortness of breath. Negative for chest tightness.   Cardiovascular:  Negative for chest pain, palpitations and leg swelling.  Gastrointestinal:  Negative for abdominal distention, abdominal pain, constipation, diarrhea, nausea and vomiting.  Musculoskeletal: Negative.   Skin: Negative.   Neurological: Negative.   Psychiatric/Behavioral: Negative.     Objective:  Physical Exam Constitutional:      Appearance: She is well-developed. She is ill-appearing.  HENT:     Head: Normocephalic and atraumatic.  Cardiovascular:     Rate and Rhythm: Normal rate and regular rhythm.  Pulmonary:     Effort: Pulmonary effort is normal. No respiratory distress.     Breath sounds: Normal breath sounds. No wheezing or rales.  Abdominal:     General: Bowel sounds are normal. There is no distension.     Palpations: Abdomen is soft.     Tenderness: There is no abdominal tenderness. There is no rebound.  Musculoskeletal:     Cervical back: Normal range of motion.  Skin:    General: Skin is warm and dry.  Neurological:     Mental Status: She is alert and oriented to person, place, and time.     Coordination: Coordination normal.     Vitals:   12/02/20 1404  BP: 126/90  Pulse: 63  Resp: 18  Temp: 98.1 F (36.7 C)  TempSrc: Oral  SpO2: 98%  Weight: 222 lb 3.2 oz (100.8 kg)  Height: '5\' 6"'$  (1.676 m)    This visit occurred during the SARS-CoV-2 public health emergency.  Safety protocols were in place, including screening questions prior to the visit, additional usage of staff PPE, and extensive cleaning of exam room while observing appropriate contact time as indicated for disinfecting solutions.   Assessment & Plan:

## 2020-12-02 NOTE — Assessment & Plan Note (Signed)
Will check on status of PFT referral which was done in June since she has not heard about this. Could be COPD/asthma or scoliosis (restriction) or cardiac. Ordered echo to rule out CHF given BNP recently. There was an age adjusted negative d-dimer at onset of problems making PE unlikely. Rx advair to try to see if this improves symptoms since prednisone has helped her.

## 2020-12-02 NOTE — Assessment & Plan Note (Signed)
Rx paxil 10 mg daily and return in 1 month and adjust as needed.

## 2020-12-02 NOTE — Patient Instructions (Addendum)
We have sent in the advair to take 1 puff twice a day to see if this helps the breathing.  We have sent in paxil for the mood to try 1 pill daily. Call or mychart message in 2 weeks to let us know how this is doing and we can adjust the dose if needed.   We will get the lung test and the echo of the heart.

## 2020-12-12 ENCOUNTER — Ambulatory Visit: Payer: Medicare HMO

## 2020-12-18 HISTORY — PX: TRANSTHORACIC ECHOCARDIOGRAM: SHX275

## 2020-12-23 ENCOUNTER — Other Ambulatory Visit: Payer: Self-pay

## 2020-12-23 ENCOUNTER — Ambulatory Visit (INDEPENDENT_AMBULATORY_CARE_PROVIDER_SITE_OTHER): Payer: Medicare HMO | Admitting: Internal Medicine

## 2020-12-23 DIAGNOSIS — R0609 Other forms of dyspnea: Secondary | ICD-10-CM

## 2020-12-23 DIAGNOSIS — R0600 Dyspnea, unspecified: Secondary | ICD-10-CM

## 2020-12-23 LAB — PULMONARY FUNCTION TEST
DL/VA % pred: 100 %
DL/VA: 4.23 ml/min/mmHg/L
DLCO cor % pred: 75 %
DLCO cor: 14.32 ml/min/mmHg
DLCO unc % pred: 75 %
DLCO unc: 14.4 ml/min/mmHg
FEF 25-75 Post: 1.31 L/sec
FEF 25-75 Pre: 1.23 L/sec
FEF2575-%Change-Post: 6 %
FEF2575-%Pred-Post: 75 %
FEF2575-%Pred-Pre: 70 %
FEV1-%Change-Post: 0 %
FEV1-%Pred-Post: 80 %
FEV1-%Pred-Pre: 80 %
FEV1-Post: 1.47 L
FEV1-Pre: 1.46 L
FEV1FVC-%Change-Post: -2 %
FEV1FVC-%Pred-Pre: 99 %
FEV6-%Change-Post: 2 %
FEV6-%Pred-Post: 85 %
FEV6-%Pred-Pre: 83 %
FEV6-Post: 1.93 L
FEV6-Pre: 1.88 L
FEV6FVC-%Change-Post: 0 %
FEV6FVC-%Pred-Post: 103 %
FEV6FVC-%Pred-Pre: 104 %
FVC-%Change-Post: 3 %
FVC-%Pred-Post: 82 %
FVC-%Pred-Pre: 80 %
FVC-Post: 1.94 L
FVC-Pre: 1.88 L
Post FEV1/FVC ratio: 76 %
Post FEV6/FVC ratio: 99 %
Pre FEV1/FVC ratio: 78 %
Pre FEV6/FVC Ratio: 100 %
RV % pred: 113 %
RV: 2.36 L
TLC % pred: 91 %
TLC: 4.47 L

## 2020-12-23 NOTE — Progress Notes (Signed)
PFT done today. 

## 2020-12-24 ENCOUNTER — Encounter: Payer: Self-pay | Admitting: Internal Medicine

## 2020-12-24 DIAGNOSIS — R071 Chest pain on breathing: Secondary | ICD-10-CM

## 2020-12-25 NOTE — Progress Notes (Signed)
[facility]  [place] Phone: [phone] Subjective:   [name], am serving as a scribe for Dr. Hulan Saas.  This visit occurred during the SARS-CoV-2 public health emergency.  Safety protocols were in place, including screening questions prior to the visit, additional usage of staff PPE, and extensive cleaning of exam room while observing appropriate contact time as indicated for disinfecting solutions.   I'm seeing this patient by the request  of:  Hoyt Koch, MD  CC: Neck pain  EXN:[id number]  [name] is a 65 y.o. female coming in with complaint of upper back pain. Patient last seen in January 2022 for left hand pain. Had epidural nerve root in December 2021. Patient states that she is having R sided upper back and shoulder pain for past 3 weeks. Pain can sometime feel numb. Pain radiates down into tricep, R thumb and up into cervical spine. Trouble sleeping due to pain. Tried heat, ice, Tylenol, Tramadol.   Lumbar xray 07/03/2019 IMPRESSION: Stable postoperative change with support hardware intact and stable. Arthropathy at L3-4 and L4-S1 appears stable. [name] fracture or spondylolisthesis.       Past Medical History:  Diagnosis Date   Anemia    takes Ferrous Sulfate daily   Anxiety    takes Citalopram daily   Arthritis    Asthma    2004-prior to gastric bypass and [name] problems since   CHF (congestive heart failure) ([place])    takes Lasix daily as needed   Chronic back pain    spondylolisthesis/stenosis/radiculopathy   Complication of anesthesia yrs ago   slow to wake up   Depression    Diabetes ([place])    DVT (deep venous thrombosis) ([place]) 01/17/2013   past hx. -tx.5-6 yrs ago bilateral legs, occ. sporadic swelling, has IVC filter implanted   Dysrhythmia    "heart tends to flutter"   Fibromyalgia    Fracture    right foot and is in a cam boot   Gallstones    GERD  (gastroesophageal reflux disease)    hx of-[name] meds now   Heart murmur    History of bronchitis 2012 or 2013   History of colon polyps    Hypertension    takes Metoprolol daily   Insomnia    takes Melatonin daily   Pelvic floor dysfunction    Peripheral neuropathy    Pneumonia 90's   hx of   S/P gastric bypass 2003   Sleep apnea    [name] cpap used in many yrs after weight lost-[name] machine now   Urinary frequency    Urinary urgency    Vitamin D deficiency    Past Surgical History:  Procedure Laterality Date   BALLOON DILATION N/A 01/31/2013   Procedure: BALLOON DILATION;  Surgeon: Arta Silence, MD;  Location: [facility];  Service: Endoscopy;  Laterality: N/A;   CARDIAC EVENT MONITOR  03/2019   Predominantly sinus rhythm.  Rates range from 48-124 bpm.  Average 74 bpm.  Frequent short bursts (3-15 beats) PAT/PSVT--not indicated as being symptomatic on diary.  Otherwise rare PACs and PVCs.   CHOLECYSTECTOMY  2007   COLONOSCOPY     DILATION AND CURETTAGE OF UTERUS  yrs ago   ESOPHAGOGASTRODUODENOSCOPY (EGD) WITH PROPOFOL  03/01/2012   Procedure: ESOPHAGOGASTRODUODENOSCOPY (EGD) WITH PROPOFOL;  Surgeon: Arta Silence, MD;  Location: [facility];  Service: Endoscopy;  Laterality: N/A;   ESOPHAGOGASTRODUODENOSCOPY (EGD) WITH PROPOFOL N/A 01/31/2013  Procedure: ESOPHAGOGASTRODUODENOSCOPY (EGD) WITH PROPOFOL;  Surgeon: Arta Silence, MD;  Location: [facility];  Service: Endoscopy;  Laterality: N/A;   ESOPHAGOGASTRODUODENOSCOPY (EGD) WITH PROPOFOL N/A 09/02/2015   Procedure: ESOPHAGOGASTRODUODENOSCOPY (EGD) WITH PROPOFOL;  Surgeon: Gatha Mayer, MD;  Location: [facility];  Service: Endoscopy;  Laterality: N/A;   GASTRIC BY-PASS  2004   HERNIA REPAIR  2005   INSERTION OF VENA CAVA FILTER  01-17-13   inserted 2004- "abdomen"   LIGAMENT REPAIR Right 1987   Rt. knee scope   MAXIMUM ACCESS (MAS)POSTERIOR LUMBAR INTERBODY FUSION (PLIF) 2 LEVEL N/A 06/07/2014   Procedure: L4-5 L5-S1 FOR  MAXIMUM ACCESS (MAS) POSTERIOR LUMBAR INTERBODY FUSION ;  Surgeon: Erline Levine, MD;  Location: [facility];  Service: Neurosurgery;  Laterality: N/A;  L4-5 L5-S1 FOR MAXIMUM ACCESS (MAS) POSTERIOR LUMBAR INTERBODY FUSION    TONSILLECTOMY     as child   TRANSTHORACIC ECHOCARDIOGRAM  03/2019   EF 60 to 65%.  Mild LVH.  [name] R WMA.  Normal RV size.  Normal LA, mild RA dilation.  [name] aortic valve stenosis or sclerosis.   Social History   Socioeconomic History   Marital status: Widowed    Spouse name: Not on file   Number of children: 0   Years of education: college   Highest education level: Master's degree (e.g., MA, MS, MEng, MEd, MSW, MBA)  Occupational History   Occupation: retired  Tobacco Use   Smoking status: Former    Packs/day: 0.50    Years: 10.00    Pack years: 5.00    Types: Cigarettes    Quit date: 04/19/2009    Years since quitting: 11.6   Smokeless tobacco: Never  Substance and Sexual Activity   Alcohol use: Yes    Alcohol/week: 0.0 standard drinks    Comment: occ. social- wine-1 drink monthly   Drug use: [name]   Sexual activity: Not Currently  Other Topics Concern   Not on file  Social History Narrative   Widow.  [name] kids.  Lives with her mother who is 24.  She does walk with a cane.  She is a retired Investment banker, corporate   She quit smoking in 2003   Social Determinants of Health   Financial Resource Strain: Medium Risk   Difficulty of Paying Living Expenses: Somewhat hard  Food Insecurity: Landscape architect Present   Worried About Charity fundraiser in the Last Year: Sometimes true   Arboriculturist in the Last Year: Sometimes true  Transportation Needs: Public librarian (Medical): Yes   Lack of Transportation (Non-Medical): [name]  Physical Activity: Unknown   Days of Exercise per Week: 0 days   Minutes of Exercise per Session: Not on file  Stress: Stress Concern Present   Feeling of Stress : To some extent  Social Connections:  Unknown   Frequency of Communication with Friends and Family: More than three times a week   Frequency of Social Gatherings with Friends and Family: Once a week   Attends Religious Services: Patient refused   Active Member of Clubs or Organizations: Yes   Attends Archivist Meetings: 1 to 4 times per year   Marital Status: Widowed   Allergies  Allergen Reactions   Carafate [Sucralfate] Hives, Itching and Other (See Comments)    Angioedema      Sulfonamide Derivatives Rash    Syncope   Family History  Problem Relation Age of Onset   Diabetes Mother  Atrial fibrillation Mother    Hypertension Mother    Heart disease Father        Unsure of details   Hypertension Father    Prostate cancer Father    Alzheimer's disease Father    Kidney disease Brother    Healthy Sister    Diabetes Maternal Grandmother    Heart disease Maternal Grandmother    Stroke Maternal Grandfather 50   Hypertension Maternal Grandfather    Hypertension Paternal Grandmother    Alzheimer's disease Paternal Grandmother    Colon cancer Neg Hx    Colon polyps Neg Hx    Stomach cancer Neg Hx    Esophageal cancer Neg Hx    Pancreatic cancer Neg Hx    Liver disease Neg Hx       Current Outpatient Medications (Respiratory):    albuterol (VENTOLIN HFA) 108 (90 Base) MCG/ACT inhaler, INHALE 2 PUFFS INTO THE LUNGS EVERY 6 HOURS AS NEEDED FOR WHEEZING OR SHORTNESS OF BREATH   fluticasone-salmeterol (ADVAIR) 100-50 MCG/ACT AEPB, Inhale 1 puff into the lungs 2 (two) times daily.  Current Outpatient Medications (Analgesics):    traMADol (ULTRAM) 50 MG tablet, TAKE 1 TABLET(50 MG) BY MOUTH FOUR TIMES DAILY   Current Outpatient Medications (Other):    ACCU-CHEK SOFTCLIX LANCETS lancets, CHECK BLOOD SUGARS TWICE DAILY   Alcohol Swabs (B-D SINGLE USE SWABS REGULAR) PADS, USE AS DIRECTED EVERY DAY   Blood Glucose Calibration (ACCU-CHEK AVIVA) SOLN, Use as directed   Blood Glucose Monitoring Suppl  (ACCU-CHEK AVIVA PLUS) w/Device KIT, USE AS DIRECTED  TO CHECK BLOOD SUGAR EVERY DAY   glucose blood (ACCU-CHEK AVIVA PLUS) test strip, 1 each by Other route 2 (two) times daily. Use to check blood sugars twice a day   lipase/protease/amylase (CREON) 36000 UNITS CPEP capsule, Take 1 capsule by mouth in the morning, at noon, in the evening, and at bedtime.   PARoxetine (PAXIL) 10 MG tablet, Take 1 tablet (10 mg total) by mouth daily.   Vitamin D, Ergocalciferol, (DRISDOL) 1.25 MG (50000 UNIT) CAPS capsule, Take 1 capsule (50,000 Units total) by mouth every 7 (seven) days.   Reviewed prior external information including notes and imaging from  primary care provider As well as notes that were available from care everywhere and other healthcare systems.  Past medical history, social, surgical and family history all reviewed in electronic medical record.  [name] pertanent information unless stated regarding to the chief complaint.   Review of Systems:  [name] headache, visual changes, nausea, vomiting, diarrhea, constipation, dizziness, abdominal pain, skin rash, fevers, chills, night sweats, weight loss, swollen lymph nodes, body aches, joint swelling, chest pain, shortness of breath, mood changes. POSITIVE muscle aches  Objective  Blood pressure 126/80, pulse 67, height '5\' 6"'$  (1.676 m), weight 218 lb (98.9 kg), SpO2 97 %.   General: [name] apparent distress alert and oriented x3 mood and affect normal, dressed appropriately.  HEENT: Pupils equal, extraocular movements intact  Respiratory: Patient's speak in full sentences and does not appear short of breath  Cardiovascular: Trace lower extremity edema, non tender, [name] erythema  Gait severely antalgic MSK: Neck exam does have some loss of lordosis.  Positive Spurling's on the right side.  Significant tightness noted of the right paraspinal scapular region.  Patient noted does have atrophy noted of the shoulder girdle on the right side.    Impression and  Recommendations:    The above documentation has been reviewed and is accurate and complete Lyndal Pulley, DO

## 2020-12-26 ENCOUNTER — Ambulatory Visit: Payer: Medicare HMO | Admitting: Family Medicine

## 2020-12-26 ENCOUNTER — Ambulatory Visit (INDEPENDENT_AMBULATORY_CARE_PROVIDER_SITE_OTHER): Payer: Medicare HMO

## 2020-12-26 ENCOUNTER — Other Ambulatory Visit: Payer: Self-pay

## 2020-12-26 VITALS — BP 126/80 | HR 67 | Ht 66.0 in | Wt 218.0 lb

## 2020-12-26 DIAGNOSIS — M5010 Cervical disc disorder with radiculopathy, unspecified cervical region: Secondary | ICD-10-CM

## 2020-12-26 DIAGNOSIS — M25511 Pain in right shoulder: Secondary | ICD-10-CM

## 2020-12-26 DIAGNOSIS — M47812 Spondylosis without myelopathy or radiculopathy, cervical region: Secondary | ICD-10-CM | POA: Diagnosis not present

## 2020-12-26 DIAGNOSIS — M542 Cervicalgia: Secondary | ICD-10-CM

## 2020-12-26 DIAGNOSIS — M19011 Primary osteoarthritis, right shoulder: Secondary | ICD-10-CM | POA: Diagnosis not present

## 2020-12-26 NOTE — Assessment & Plan Note (Signed)
Patient has had difficulty for some years and seems to be potentially worsening.  We will get x-rays.  Concern for more radicular symptoms and patient does have a appears to be some weakness of the shoulder girdle.  Rotator cuff strength though does appear to be intact.  With weakness to the shoulder girdle we will continue to monitor.  Patient is having multiple different issues over the course of [place].  Follow-up with me again

## 2020-12-26 NOTE — Patient Instructions (Signed)
Xray today Start Gabapentin again PT [facility] See me again 6 weeks

## 2020-12-29 ENCOUNTER — Encounter: Payer: Self-pay | Admitting: Family Medicine

## 2020-12-30 ENCOUNTER — Other Ambulatory Visit: Payer: Self-pay

## 2020-12-30 ENCOUNTER — Inpatient Hospital Stay: Admission: RE | Admit: 2020-12-30 | Payer: Medicare HMO | Source: Ambulatory Visit

## 2020-12-30 ENCOUNTER — Encounter: Payer: Self-pay | Admitting: Internal Medicine

## 2020-12-30 DIAGNOSIS — M5416 Radiculopathy, lumbar region: Secondary | ICD-10-CM

## 2020-12-30 MED ORDER — GABAPENTIN 100 MG PO CAPS: 100.0000 mg | ORAL_CAPSULE | Freq: Two times a day (BID) | ORAL | 0 refills | Status: DC

## 2020-12-31 ENCOUNTER — Encounter: Payer: Self-pay | Admitting: Family Medicine

## 2020-12-31 ENCOUNTER — Other Ambulatory Visit: Payer: Self-pay | Admitting: Internal Medicine

## 2021-01-13 ENCOUNTER — Ambulatory Visit: Payer: Medicare HMO | Admitting: Physical Therapy

## 2021-01-13 ENCOUNTER — Other Ambulatory Visit: Payer: Self-pay

## 2021-01-14 ENCOUNTER — Ambulatory Visit (HOSPITAL_COMMUNITY): Payer: Medicare HMO | Attending: Internal Medicine

## 2021-01-14 DIAGNOSIS — R0600 Dyspnea, unspecified: Secondary | ICD-10-CM | POA: Diagnosis not present

## 2021-01-14 DIAGNOSIS — R0609 Other forms of dyspnea: Secondary | ICD-10-CM

## 2021-01-14 LAB — ECHOCARDIOGRAM COMPLETE
Area-P 1/2: 3.99 cm2
S' Lateral: 2.4 cm

## 2021-01-15 ENCOUNTER — Other Ambulatory Visit: Payer: Self-pay

## 2021-01-15 ENCOUNTER — Ambulatory Visit
Admission: RE | Admit: 2021-01-15 | Discharge: 2021-01-15 | Disposition: A | Discharge status: Home / Self Care | Payer: Medicare HMO | Source: Ambulatory Visit | Attending: Internal Medicine | Admitting: Internal Medicine

## 2021-01-15 DIAGNOSIS — Z1231 Encounter for screening mammogram for malignant neoplasm of breast: Secondary | ICD-10-CM

## 2021-01-20 ENCOUNTER — Other Ambulatory Visit: Payer: Self-pay

## 2021-01-20 ENCOUNTER — Ambulatory Visit: Payer: Medicare HMO | Attending: Family Medicine | Admitting: Physical Therapy

## 2021-01-20 DIAGNOSIS — R2689 Other abnormalities of gait and mobility: Secondary | ICD-10-CM

## 2021-01-20 DIAGNOSIS — M6281 Muscle weakness (generalized): Secondary | ICD-10-CM | POA: Diagnosis not present

## 2021-01-20 DIAGNOSIS — M25511 Pain in right shoulder: Secondary | ICD-10-CM | POA: Diagnosis not present

## 2021-01-20 DIAGNOSIS — G8929 Other chronic pain: Secondary | ICD-10-CM | POA: Diagnosis not present

## 2021-01-20 DIAGNOSIS — M5440 Lumbago with sciatica, unspecified side: Secondary | ICD-10-CM | POA: Diagnosis not present

## 2021-01-20 DIAGNOSIS — M542 Cervicalgia: Secondary | ICD-10-CM | POA: Diagnosis not present

## 2021-01-20 NOTE — Therapy (Addendum)
[facility] [address] [place], [place], [address] Phone: [phone]   Fax:  [phone]  Physical Therapy Evaluation/Discharge Note  Patient Details  Name: [name] MRN: [id number] Date of Birth: [birth year] Referring Provider (PT): Judi Saa DO  Encounter Date: 01/20/2021   PT End of Session - 01/20/21 1658     Visit Number 1    Number of Visits 16    Date for PT Re-Evaluation 03/17/21    Authorization Type [employer] MCR    PT Start Time 1500    PT Stop Time 1545    PT Time Calculation (min) 45 min    Activity Tolerance Patient tolerated treatment well    Behavior During Therapy [facility] for tasks assessed/performed             Past Medical History:  Diagnosis Date   Anemia    takes Ferrous Sulfate daily   Anxiety    takes Citalopram daily   Arthritis    Asthma    2004-prior to gastric bypass and no problems since   CHF (congestive heart failure) (HCC)    takes Lasix daily as needed   Chronic back pain    spondylolisthesis/stenosis/radiculopathy   Complication of anesthesia yrs ago   slow to wake up   Depression    Diabetes (HCC)    DVT (deep venous thrombosis) (HCC) 01/17/2013   past hx. -tx.5-6 yrs ago bilateral legs, occ. sporadic swelling, has IVC filter implanted   Dysrhythmia    "heart tends to flutter"   Fibromyalgia    Fracture    right foot and is in a cam boot   Gallstones    GERD (gastroesophageal reflux disease)    hx of-no meds now   Heart murmur    History of bronchitis 2012 or 2013   History of colon polyps    Hypertension    takes Metoprolol daily   Insomnia    takes Melatonin daily   Pelvic floor dysfunction    Peripheral neuropathy    Pneumonia 90's   hx of   S/P gastric bypass 2003   Sleep apnea    no cpap used in many yrs after weight lost-no machine now   Urinary frequency    Urinary urgency    Vitamin D deficiency     Past Surgical History:  Procedure  Laterality Date   BALLOON DILATION N/A 01/31/2013   Procedure: BALLOON DILATION;  Surgeon: Willis Modena, MD;  Location: [facility];  Service: Endoscopy;  Laterality: N/A;   CARDIAC EVENT MONITOR  03/2019   Predominantly sinus rhythm.  Rates range from 48-124 bpm.  Average 74 bpm.  Frequent short bursts (3-15 beats) PAT/PSVT--not indicated as being symptomatic on diary.  Otherwise rare PACs and PVCs.   CHOLECYSTECTOMY  2007   COLONOSCOPY     DILATION AND CURETTAGE OF UTERUS  yrs ago   ESOPHAGOGASTRODUODENOSCOPY (EGD) WITH PROPOFOL  03/01/2012   Procedure: ESOPHAGOGASTRODUODENOSCOPY (EGD) WITH PROPOFOL;  Surgeon: Willis Modena, MD;  Location: [facility];  Service: Endoscopy;  Laterality: N/A;   ESOPHAGOGASTRODUODENOSCOPY (EGD) WITH PROPOFOL N/A 01/31/2013   Procedure: ESOPHAGOGASTRODUODENOSCOPY (EGD) WITH PROPOFOL;  Surgeon: Willis Modena, MD;  Location: [facility];  Service: Endoscopy;  Laterality: N/A;   ESOPHAGOGASTRODUODENOSCOPY (EGD) WITH PROPOFOL N/A 09/02/2015   Procedure: ESOPHAGOGASTRODUODENOSCOPY (EGD) WITH PROPOFOL;  Surgeon: Iva Boop, MD;  Location: [facility];  Service: Endoscopy;  Laterality: N/A;   GASTRIC BY-PASS  2004   HERNIA REPAIR  2005  INSERTION OF VENA CAVA FILTER  01-17-13   inserted 2004- "abdomen"   LIGAMENT REPAIR Right 1987   Rt. knee scope   MAXIMUM ACCESS (MAS)POSTERIOR LUMBAR INTERBODY FUSION (PLIF) 2 LEVEL N/A 06/07/2014   Procedure: L4-5 L5-S1 FOR MAXIMUM ACCESS (MAS) POSTERIOR LUMBAR INTERBODY FUSION ;  Surgeon: Erline Levine, MD;  Location: [facility];  Service: Neurosurgery;  Laterality: N/A;  L4-5 L5-S1 FOR MAXIMUM ACCESS (MAS) POSTERIOR LUMBAR INTERBODY FUSION    TONSILLECTOMY     as child   TRANSTHORACIC ECHOCARDIOGRAM  03/2019   EF 60 to 65%.  Mild LVH.  No R WMA.  Normal RV size.  Normal LA, mild RA dilation.  No aortic valve stenosis or sclerosis.    There were no vitals filed for this visit.    Subjective Assessment - 01/20/21  1504     Subjective I am having problems with my right knee today  I think it is the weather.  I broke my left wrist in January 2022 falling down the steps. and I saw Dr Tamala Julian and he sent me to an orthopedic surgeon Milly Jakob. MD.  I was in a cast for quite a while.  I was still complaining of pain and numbnessin my fingers.  and thought I had carpal tunnel surgery  but I have a hard time picking up things with fingers because of numbess and weakness.  (PT noted R eye blood vessel rupture and made pt aware)  I have recently seen Dr Tamala Julian for my low back and my cervical/ R arm recently.  My CC is  my limited use of my left hand.  I have numbness and tingling in BOTH hands. I fractured my foot and I developed a limp and I used a cane .  I continue to limp without a cane.    Pertinent History asthma, DM, pancreatitis, depression , anxiety, DVT, CHF,  Gastric bypass, bronchitis , pelvic floor dysfunction, perifpheral neuropathy, R foot fx. Back surgery 2018,  See medical snapshot    Limitations Sitting;Standing;Walking;House hold activities   picking up items with hands difficult   How long can you sit comfortably? no more than 30 mintues    How long can you stand comfortably? 5-10 minutes    How long can you walk comfortably? I walk every day one full block every day/ .25 mile takes 25 minutes and walk with a cane    Diagnostic tests x rays  cervicalProgressive osteoarthritic change at C3-C4 and C5-C6. R shldMild glenohumeral and acromioclavicular degenerative change,  minimally increased from prior.    Patient Stated Goals I would like to be able to walk for exercise, with minimal pain. I would like to return to dressing myself like I did in January , tying a shoe and putting on my erring, unable to get up and down from the floor    Currently in Pain? Yes    Pain Score 4    at worst9/10   Pain Location Back    Pain Orientation Right    Pain Descriptors / Indicators Aching;Sharp    Pain Type Chronic  pain    Pain Onset More than a month ago   3 mionths   Pain Frequency Intermittent    Aggravating Factors  lying down bothers my back pain,  I sleep in flexed fetal position, 2 hours of sleep only a night    Multiple Pain Sites Yes    Pain Score 6   at worst 8/10   Pain  Location Neck    Pain Orientation Right;Left;Mid    Pain Descriptors / Indicators Aching    Pain Type Chronic pain    Pain Onset More than a month ago    Aggravating Factors  sitting laying down,    Pain Score 7   at worst 9/10   Pain Location Shoulder    Pain Orientation Right    Pain Descriptors / Indicators Aching;Sharp    Pain Type Chronic pain    Pain Onset More than a month ago    Pain Frequency Intermittent    Aggravating Factors  lifting above head, carring any items                [facility] PT Assessment - 01/20/21 0001       Assessment   Medical Diagnosis Lumbar radiculopathy, cervical spine pain, R shld pain    Referring Provider (PT) Lyndal Pulley DO    Onset Date/Surgical Date 11/17/20   Aug 2022 neck pain  July 2022 back pain   Hand Dominance Right    Prior Therapy Yes  back PT after back surgery      Precautions   Precaution Comments Pt with decreased grip strength      Restrictions   Weight Bearing Restrictions No      Balance Screen   Has the patient fallen in the past 6 months No    Has the patient had a decrease in activity level because of a fear of falling?  No    Is the patient reluctant to leave their home because of a fear of falling?  No      Home Environment   Living Environment Private residence    Type of [place];Shower seat;Walker - 4 wheels;Kasandra Knudsen - single point      Prior Function   Level of Independence Independent    Vocation Retired    Biomedical scientist former [employer]      Cognition   Overall Cognitive Status Within Functional Limits for  tasks assessed      Observation/Other Assessments   Focus on Therapeutic Outcomes (FOTO)  FOTO intake neck 34% predicted 54%, shld intake 32% predicted58%      Sensation   Additional Comments increased tingling/ numbness in bil hands.  L>R      Functional Tests   Functional tests Squat;Lunges;Floor to Stand;Single leg stance      Squat   Comments unable to squat      Lunges   Comments unable to lunge      Single Leg Stance   Comments < 3 sec on R and L      Sit to Stand   Comments 5 x STS 21.92      Floor to Stand   Comments unable to do x fer      Posture/Postural Control   Posture/Postural Control Postural limitations    Postural Limitations Rounded Shoulders;Forward head;Increased lumbar lordosis;Anterior pelvic tilt;Weight shift left      ROM / Strength   AROM / PROM / Strength AROM;Strength      AROM   Overall AROM  Deficits    Right Shoulder Flexion 142 Degrees    Right Shoulder ABduction 135 Degrees    Right Shoulder Internal Rotation 50 Degrees    Right Shoulder External Rotation 53 Degrees    Left Shoulder Flexion 138  Degrees    Left Shoulder ABduction 130 Degrees    Left Shoulder Internal Rotation 60 Degrees    Left Shoulder External Rotation 70 Degrees    Cervical Flexion 40    Cervical Extension 40    Cervical - Right Side Bend 28    Cervical - Left Side Bend 20    Cervical - Right Rotation 38    Cervical - Left Rotation 55    Lumbar Flexion 50%    Lumbar Extension 30%    Lumbar - Right Side Bend 50%    Lumbar - Left Side Bend 50%    Lumbar - Right Rotation 50%   pain   Lumbar - Left Rotation 50%      Strength   Overall Strength Deficits    Right Shoulder Flexion 4-/5    Right Shoulder ABduction 4-/5    Left Shoulder Flexion 4/5    Left Shoulder ABduction 4/5    Right Hand Grip (lbs) 16lb    Left Hand Grip (lbs) 10.6 lb    Right Hip Flexion 4-/5    Right Hip Extension 3-/5    Right Hip ABduction 3-/5    Left Hip Flexion 4-/5    Left Hip  Extension 4-/5    Left Hip ABduction 4-/5    Right Knee Flexion 4-/5    Right Knee Extension 4-/5    Left Knee Flexion 4/5    Left Knee Extension 4/5      Palpation   Palpation comment tenderness over bil cervical paraspinals.  C-3 to C- 6,  bil lumbar paraspinals and QL R> L and TTP over R sub scap and anterior shld                        Objective measurements completed on examination: See above findings.                PT Education - 01/20/21 1529     Education Details POC Explanation of findings, initial posture/ intial sleeping posture    Person(s) Educated Patient    Methods Explanation;Demonstration;Tactile cues;Verbal cues    Comprehension Verbalized understanding;Returned demonstration              PT Short Term Goals - 01/20/21 1548       PT SHORT TERM GOAL #1   Title Patient verbalizes & demonstrates understanding of initial HEP.    Baseline Pt not consistent with any exercise since fall in January and broken wrist    Time 3    Period Weeks    Status New    Target Date 02/10/21      PT SHORT TERM GOAL #2   Title Pt will be able to drive and turn head in car with 50% decreased discomfort    Baseline Pt unable to turn neck without pain    Time 3    Period Weeks    Status New    Target Date 02/10/21      PT SHORT TERM GOAL #3   Title 5 x STS improved to at least 15 sec    Baseline eval 5 x STS 21.92 sec    Time 3    Period Weeks    Status New    Target Date 02/10/21      PT SHORT TERM GOAL #4   Title Pt will be able to  schedule aquatic sessions to return to exercise at [place] with  inabiltiy to exercises for daily strength due to pain    Time 3    Period Weeks    Status New    Target Date 03/03/21               PT Long Term Goals - 01/20/21 1550       PT LONG TERM GOAL #1   Title Pt will be independent with advanced HEP    Baseline not currently exercising routinely    Time 8     Period Weeks    Status New    Target Date 03/17/21      PT LONG TERM GOAL #2   Title Patient will be able to walk for 30 minutes tolerating pain 3/10 or less in order to perform more regular exercise on land    Baseline unable to stand /walk greater than 5-10 minutes    Time 8    Period Weeks    Status New    Target Date 03/17/21      PT LONG TERM GOAL #3   Title Pt will be able to sleep for 4 or more hours of uninterrupted restorative sleep    Baseline wakes after 2 hours each night and pain in neck/back and shld    Time 8    Period Weeks    Status New    Target Date 03/17/21      PT LONG TERM GOAL #4   Title Pt will be able to lift arms above head at least 150 degrees flexion with neck pain 3/10 or less    Baseline Pt will limited AROM/ pain in neck when lifting at 90 degrees or above    Time 8    Period Weeks    Status New    Target Date 03/17/21      PT LONG TERM GOAL #5   Title FOTO will improve neck intake from 34%    to  54%   indicating improved functional mobility.   and shld 32% to 58%    Baseline eval shld 32% and neck 34%    Time 8    Period Weeks    Status New    Target Date 03/17/21      PT LONG TERM GOAL #6   Title R shoulder IR and ER will return to [facility] to return to pain-free ADLs such as dressing and grooming.    Baseline Pt difficult to dress and hair    Time 8    Period Weeks    Status New    Target Date 03/17/21      PT LONG TERM GOAL #7   Title Pt will be able to demonstrate ability to rise and descend to floor with safe technique    Baseline unable to perform floor to stand x fer    Time 8    Period Weeks    Status New    Target Date 03/17/21                    Plan - 01/20/21 1621     Clinical Impression Statement 65 yo female with complaint of decreasing use of hands especially L hand since january 2022 when she fell and broke left wrist.  she recently saw Dr Terrilee Files DO for consultation of cervical and lumbar radiculopathy.   Pt also has R shld pain complaint.  She lives alone and is experiencing increasing  severe weakness in bil hand strength ( R  16lb  L 10 lb  )(average 37 lb for women  Pt with 5 X STS 21.92 sec ( normal 13 sec)  Pt tends to wt bear to left due to pain in R knee/R ankle.  Pt has multiple areas of pain but  back pain 4/10 worst 9/10, neck 6/10 worst 8/10, R shld 7/10, worst 9/10.  Pt does not report any falls but she demonstrates LE weakness with 5 x STS.  Pt CC is inablity to grip items with hands and her and pain while lying down at night interrupts he sleep to only about 2 hours a night.  Pt will benefit from skilled PT to address multiple issues with pain, AROM, strength, falls preparedness and posture as well as gait disturbances causing her to avoid stairs and inablity to perform floor x fers    Personal Factors and Comorbidities Comorbidity 3+    Comorbidities asthma, DM, pancreatitis, depression , anxiety, DVT, CHF,  Gastric bypass, bronchitis , pelvic floor dysfunction, perifpheral neuropathy, R foot fx. Back surgery 2018,  See medical snapshot    Examination-Activity Limitations Sleep;Sit;Squat;Stairs;Stand;Transfers;Locomotion Level;Dressing;Carry;Reach Overhead    Examination-Participation Restrictions Cleaning;Laundry    Stability/Clinical Decision Making Unstable/Unpredictable    Clinical Decision Making Moderate    Rehab Potential Fair    PT Frequency 2x / week    PT Duration 8 weeks    PT Treatment/Interventions ADLs/Self Care Home Management;Aquatic Therapy;Cryotherapy;Electrical Stimulation;Iontophoresis 4mg /ml Dexamethasone;Moist Heat;Traction;Gait training;Stair training;Functional mobility training;Therapeutic activities;Therapeutic exercise;Balance training;Neuromuscular re-education;Joint Manipulations;Spinal Manipulations;Vestibular;Taping;Dry needling;Passive range of motion;Patient/family education;Manual techniques    PT Next Visit Plan assess balance, manual for cervical/ r shld   TPDN    PT Home Exercise Plan posture training    Consulted and Agree with Plan of Care Patient             Patient will benefit from skilled therapeutic intervention in order to improve the following deficits and impairments:  Abnormal gait, Decreased activity tolerance, Decreased mobility, Decreased range of motion, Pain, Obesity, Impaired UE functional use, Improper body mechanics, Postural dysfunction, Impaired sensation, Increased muscle spasms, Hypomobility, Decreased strength, Decreased balance  Visit Diagnosis: Cervicalgia  Chronic right shoulder pain  Bilateral low back pain with sciatica, sciatica laterality unspecified, unspecified chronicity  Muscle weakness (generalized)  Other abnormalities of gait and mobility     Problem List Patient Active Problem List   Diagnosis Date Noted   Chest pain on breathing 08/11/2020   Closed fracture of left distal radius 05/01/2020   Recurrent Clostridioides difficile infection 02/28/2020   Pyelonephritis 02/28/2020   Abnormal urine odor 08/10/2019   Gastrointestinal complaint 07/05/2019   Muscle strain of gluteal region, right, initial encounter 05/11/2019   DOE (dyspnea on exertion) 03/13/2019   Rapid heartbeat 46/56/8127   Systolic ejection murmur 51/70/0174   Leg swelling 10/09/2018   Insomnia 05/19/2018   Frequent refractory urinary tract infections 05/05/2018   Right leg pain 12/16/2017   History of deep vein thrombosis (DVT) of lower extremity 12/12/2017   Cavovarus deformity of foot, acquired, unspecified laterality 04/07/2017   S/P gastric bypass 08/18/2016   Muscle cramps 07/09/2016   Adjustment disorder with mixed anxiety and depressed mood 03/05/2016   Fatigue 03/05/2016   Cervical disc disorder with radiculopathy of cervical region 02/27/2016   Degenerative arthritis of right knee 02/27/2016   Right knee pain 02/24/2016   Left shoulder pain 02/24/2016   Routine general medical examination at a health care  facility 12/12/2015   Schatzki's ring    Lumbar radiculopathy 03/05/2015   Peripheral  neuropathy 01/17/2015   Chest pain with moderate risk for cardiac etiology 05/21/2013   Fibromyalgia 02/21/2011   Essential hypertension 12/04/2006   Hx of diabetes mellitus 10/20/2006   Morbid obesity ([place]) 10/20/2006   MDD (major depressive disorder) ([place]) 10/20/2006   OBSTRUCTIVE SLEEP APNEA 10/20/2006   OSTEOARTHRITIS 10/20/2006    Voncille Lo, PT, [place] Certified Exercise Expert for the Aging Adult  01/20/21 5:37 PM Phone: [phone] Fax: [facility] [address] [place], [place], [address] Phone: [phone]   Fax:  [phone]  Name: [name] MRN: [id number] Date of Birth: 1956/03/30 Referring diagnosis? Lumbar radiculopathy , Cervical spine pain, R shld pain Treatment diagnosis? (if different than referring diagnosis) cervicalgia, lumbar radiculopathy, right shld pain,  What was this (referring dx) caused by? []  Surgery [x]  Fall [x]  Ongoing issue []  Arthritis []  Other: ____________  Laterality: []  Rt []  Lt [x]  Both  Check all possible CPT codes:      []  97110 (Therapeutic Exercise)  []  92507 (SLP Treatment)  []  97112 (Neuro Re-ed)   []  92526 (Swallowing Treatment)   []  97116 (Gait Training)   []  [id number] (Cognitive Training, 1st 15 minutes) []  97140 (Manual Therapy)   []  97130 (Cognitive Training, each add'l 15 minutes)  []  97530 (Therapeutic Activities)  []  Other, List CPT Code ____________    []  05397 (Self Care)       [x]  All codes above (97110 - 97535)  [x]  97012 (Mechanical Traction)  [x]  97014 (E-stim Unattended)  [x]  97032 (E-stim manual)  [x]  97033 (Ionto)  []  97035 (Ultrasound)  []  97760 (Orthotic Fit) [x]  [id number] (Physical Performance Training) [x]  [id number] (Aquatic Therapy) []  97034 (Contrast Bath) []  [id number] (Paraffin) []  97597 (Wound Care 1st 20 sq cm) []  97598 (Wound Care each  add'l 20 sq cm) []  97016 (Vasopneumatic Device) []  [phone] Comptroller) []  [id number] (Prosthetic Training)   PHYSICAL THERAPY DISCHARGE SUMMARY  Visits from Start of Care: 1  Current functional level related to goals / functional outcomes: unknown   Remaining deficits: unknown   Education / Equipment: Initial HEP education   Patient agrees to discharge. Patient goals were not met. Patient is being discharged due to not returning since the last visit.  Pt never returned after intiial evaluation  Voncille Lo, PT, [facility] Certified Exercise Expert for the Aging Adult  04/07/21 2:51 PM Phone: [phone] Fax: [phone]

## 2021-01-20 NOTE — Patient Instructions (Signed)
Posture Tips DO: - stand tall and erect - keep chin tucked in - keep head and shoulders in alignment - check posture regularly in mirror or large window - pull head back against headrest in car seat;  Change your position often.  Sit with lumbar support. DON'T: - slouch or slump while watching TV or reading - sit, stand or lie in one position  for too long;  Sitting is especially hard on the spine so if you sit at a desk/use the computer, then stand up often!   Copyright  VHI. All rights reserved.  Posture - Standing   Good posture is important. Avoid slouching and forward head thrust. Maintain curve in low back and align ears over shoul- ders, hips over ankles.  Pull your belly button in toward your back bone. Standi with ribs lifted up and chin dow.  Try not to have forward head   Copyright  VHI. All rights reserved.  Posture - Sitting   Sit upright, head facing forward. Try using a roll to support lower back. Keep shoulders relaxed, and avoid rounded back. Keep hips level with knees. Avoid crossing legs for long periods. Sit on sit bones and not tail bone  Voncille Lo, PT, [place] Certified Exercise Expert for the Aging Adult  01/20/21 3:29 PM Phone: [phone] Fax: [phone]    Copyright  VHI. All rights reserved.

## 2021-02-02 ENCOUNTER — Encounter: Payer: Self-pay | Admitting: Internal Medicine

## 2021-02-02 ENCOUNTER — Ambulatory Visit (INDEPENDENT_AMBULATORY_CARE_PROVIDER_SITE_OTHER): Payer: Medicare HMO | Admitting: Internal Medicine

## 2021-02-02 ENCOUNTER — Other Ambulatory Visit: Payer: Self-pay

## 2021-02-02 VITALS — BP 126/78 | HR 62 | Resp 18 | Ht 66.0 in | Wt 214.4 lb

## 2021-02-02 DIAGNOSIS — F331 Major depressive disorder, recurrent, moderate: Secondary | ICD-10-CM

## 2021-02-02 DIAGNOSIS — E559 Vitamin D deficiency, unspecified: Secondary | ICD-10-CM

## 2021-02-02 DIAGNOSIS — R0609 Other forms of dyspnea: Secondary | ICD-10-CM

## 2021-02-02 DIAGNOSIS — Z111 Encounter for screening for respiratory tuberculosis: Secondary | ICD-10-CM

## 2021-02-02 LAB — VITAMIN D 25 HYDROXY (VIT D DEFICIENCY, FRACTURES): VITD: 13.86 ng/mL — ABNORMAL LOW (ref 30.00–100.00)

## 2021-02-02 MED ORDER — BUPROPION HCL ER (XL) 300 MG PO TB24: 300.0000 mg | ORAL_TABLET | Freq: Every day | ORAL | 3 refills | Status: DC

## 2021-02-02 NOTE — Patient Instructions (Addendum)
We have sent in wellbutrin 300 mg to take one pill daily. It is okay to take what you have left of the 100 mg tablets (2 pills or 200 mg) twice a day then switch.  We will recheck the labs and the TB test.  Your blood type is O negative.

## 2021-02-02 NOTE — Progress Notes (Signed)
   Subjective:   Patient ID: [name], female    DOB: Aug 22, 1955, 65 y.o.   MRN: [id number]  HPI The patient is a 65 YO female coming in for some concerns and ongoing depression.  Review of Systems  Constitutional:  Positive for activity change and fatigue.  HENT: Negative.    Eyes: Negative.   Respiratory:  Positive for shortness of breath. Negative for cough and chest tightness.        SOB much improved from before.  Cardiovascular:  Negative for chest pain, palpitations and leg swelling.  Gastrointestinal:  Negative for abdominal distention, abdominal pain, constipation, diarrhea, nausea and vomiting.  Musculoskeletal: Negative.   Skin: Negative.   Neurological: Negative.   Psychiatric/Behavioral: Negative.     Objective:  Physical Exam Constitutional:      Appearance: She is well-developed.  HENT:     Head: Normocephalic and atraumatic.  Cardiovascular:     Rate and Rhythm: Normal rate and regular rhythm.  Pulmonary:     Effort: Pulmonary effort is normal. No respiratory distress.     Breath sounds: Normal breath sounds. No wheezing or rales.  Abdominal:     General: Bowel sounds are normal. There is no distension.     Palpations: Abdomen is soft.     Tenderness: There is no abdominal tenderness. There is no rebound.  Musculoskeletal:     Cervical back: Normal range of motion.  Skin:    General: Skin is warm and dry.  Neurological:     Mental Status: She is alert and oriented to person, place, and time.     Coordination: Coordination normal.    Vitals:   02/02/21 1455  BP: 126/78  Pulse: 62  Resp: 18  SpO2: 98%  Weight: 214 lb 6.4 oz (97.3 kg)  Height: 5\' 6"  (1.676 m)    This visit occurred during the SARS-CoV-2 public health emergency.  Safety protocols were in place, including screening questions prior to the visit, additional usage of staff PPE, and extensive cleaning of exam room while observing appropriate contact time as indicated for  disinfecting solutions.   Assessment & Plan:

## 2021-02-03 ENCOUNTER — Encounter: Payer: Self-pay | Admitting: Family Medicine

## 2021-02-04 DIAGNOSIS — E559 Vitamin D deficiency, unspecified: Secondary | ICD-10-CM | POA: Insufficient documentation

## 2021-02-04 NOTE — Assessment & Plan Note (Signed)
Currently in flare and moderate severity. She is struggling to be motivated to seek out others. She is being triggered by still living in family home where she took care of parents (both deceased recently). We will increase wellbutrin to 300 mg daily (from 100 mg BID) to see if this helps as she is getting some benefit from this. She will let us know in 3-4 weeks how she is doing or sooner if worsening.

## 2021-02-04 NOTE — Assessment & Plan Note (Signed)
Took 12 weeks of high dose 50000 units weekly replacement and needs recheck vitamin D and treatment as appropriate.

## 2021-02-04 NOTE — Assessment & Plan Note (Signed)
This has improved significantly with advair and she is increasing exercise to help as well. She is encouraged to continue advair and her walking daily to maintain and improve on those gains.

## 2021-02-06 ENCOUNTER — Other Ambulatory Visit: Payer: Self-pay | Admitting: Internal Medicine

## 2021-02-06 ENCOUNTER — Ambulatory Visit: Payer: Medicare HMO | Admitting: Family Medicine

## 2021-02-06 LAB — QUANTIFERON-TB GOLD PLUS
Mitogen-NIL: >10 IU/mL
NIL: 0.03 IU/mL
QuantiFERON-TB Gold Plus: NEGATIVE
TB1-NIL: 0.07 IU/mL
TB2-NIL: 0.07 IU/mL

## 2021-02-06 MED ORDER — VITAMIN D (ERGOCALCIFEROL) 1.25 MG (50000 UNIT) PO CAPS: 50000.0000 [IU] | ORAL_CAPSULE | ORAL | 0 refills | Status: DC

## 2021-02-09 ENCOUNTER — Encounter: Payer: Self-pay | Admitting: Internal Medicine

## 2021-02-10 ENCOUNTER — Ambulatory Visit (INDEPENDENT_AMBULATORY_CARE_PROVIDER_SITE_OTHER): Payer: Medicare HMO | Admitting: Pharmacist

## 2021-02-10 ENCOUNTER — Other Ambulatory Visit: Payer: Self-pay | Admitting: Internal Medicine

## 2021-02-10 ENCOUNTER — Other Ambulatory Visit: Payer: Self-pay

## 2021-02-10 DIAGNOSIS — R0609 Other forms of dyspnea: Secondary | ICD-10-CM

## 2021-02-10 DIAGNOSIS — M5010 Cervical disc disorder with radiculopathy, unspecified cervical region: Secondary | ICD-10-CM

## 2021-02-10 DIAGNOSIS — F331 Major depressive disorder, recurrent, moderate: Secondary | ICD-10-CM

## 2021-02-10 DIAGNOSIS — I10 Essential (primary) hypertension: Secondary | ICD-10-CM

## 2021-02-10 DIAGNOSIS — E559 Vitamin D deficiency, unspecified: Secondary | ICD-10-CM

## 2021-02-10 NOTE — Patient Instructions (Addendum)
Visit Information  Phone number for Pharmacist: [phone]   Goals Addressed             This Visit's Progress    Manage My Medicine       Timeframe:  Long-Range Goal Priority:  Medium Start Date:       11/11/20                      Expected End Date:     11/11/21                  Follow Up Date: Jan 2023   - call for medicine refill 2 or 3 days before it runs out - call if I am sick and can't take my medicine - keep a list of all the medicines I take; vitamins and herbals too    Why is this important?   These steps will help you keep on track with your medicines.   Notes:         Care Plan : [place]  Updates made by Charlton Haws, [place] since 02/10/2021 12:00 AM     Problem: Hypertension, Depression, and Anxiety, Osteoarthritis, DOE   Priority: High     Long-Range Goal: Disease management   Start Date: 11/12/2020  Expected End Date: 11/12/2021  Recent Progress: On track  Priority: High  Note:   Current Barriers:  Unable to independently monitor therapeutic efficacy  Pharmacist Clinical Goal(s):  Patient will achieve adherence to monitoring guidelines and medication adherence to achieve therapeutic efficacy through collaboration with PharmD and provider.   Interventions: 1:1 collaboration with Hoyt Koch, MD regarding development and update of comprehensive plan of care as evidenced by provider attestation and co-signature Inter-disciplinary care team collaboration (see longitudinal plan of care) Comprehensive medication review performed; medication list updated in electronic medical record  Hypertension    BP goal is:  <130/80 Patient checks BP at home 1-2x per week Patient home BP readings are ranging: SBP 120s   Patient has failed these meds in the past: metoprolol, lisinopril-HCTZ, amlodipine Patient is currently controlled on the following medications: Amlodipine 10 mg daily   We discussed: pt reports she restarted  amlodipine after seeing elevated BP at home; she reports BP is under control since restarting  Plan: Continue current medications   Depression / Adjustment disorder  -Not ideally controlled - pt reports she is taking bupropion 150 mg twice daily (she thought she had 100 mg dose but it was actually 150 mg); recently PCP changed dose to 300 mg, so this was not an increase in dose; pt reports mood is not ideally controlled on bupropion alone; she is also taking PM dose of bupropion and reports trouble sleeping -Current treatment: Bupropion SR 150 mg BID (AM and PM) -Medications previously tried/failed: duloxetine, paroxetine -PHQ9: 8 (11/2020) -Connected with PCP for mental health support -Educated on Benefits of medication for symptom control -Advised pt to take all of bupropion in the AM to prevent sleep issues -Advised pt to restart paroxetine 10 mg daily  Osteoarthritis  -Controlled - pt reports pain is relatively controlled with current regimen -Current treatment  Gabapentin 100 mg BID Tramadol 50 mg BID Tylenol 500 mg PRN -Recommended to continue current medication  SOB / DOE  -Controlled - pt reports SOB is much improved with Wixela, she has not been using albuterol -PFT 12/2020 - lung volumes normal -Current treatment  Wixela 100-50 mcg/act 1 puff BID Albuterol HFA prn -  Counseled on components of Wixela and benefits of regular use; advised she could use albuterol as needed for SOB -Recommended to continue current medication  Patient Goals/Self-Care Activities Patient will:  - take medications as prescribed focus on medication adherence by routine -Restart paroxetine 10 mg daily -Move bupropion to AM  -Get Pneumovax/Prevnar-20 and Shingrix dose number 2 at local pharmacy       Patient verbalizes understanding of instructions provided today and agrees to view in [place].  Telephone follow up appointment with pharmacy team member scheduled for: 3 months  Charlene Brooke, PharmD, [place], CPP Clinical Pharmacist [facility] at [facility] [phone]

## 2021-02-10 NOTE — Progress Notes (Signed)
Chronic Care Management Pharmacy Note  02/10/2021 Name:  [name] MRN:  [id number] DOB:  [birth year]  Summary: -Pt reports improved SOB since starting Advair -Pt restarted amlodipine after BP was high at home; now BP <130/80 -Pt reports she has actually been taking bupropion 150 mg BID so the change to 300 mg is not a dose increase; she is taking second dose of bupropion in the evening and reports trouble sleeping; she asked if she could continue paroxetine at the same time -Pt did not pick up Vitamin D 50,000 since insurance didn't cover it; she bought OTC Vitamin D 5000 IU instead and is taking daily  Recommendations/Changes made from today's visit: -Advised to restart paroxetine 10 mg in addition to bupropion 300 mg/day; also advised to take full dose of bupropion in AM   Subjective: [name] is an 65 y.o. year old female who is a primary patient of Hoyt Koch, MD.  The CCM team was consulted for assistance with disease management and care coordination needs.    Engaged with patient by telephone for follow up visit in response to provider referral for pharmacy case management and/or care coordination services.   Consent to Services:  The patient was given information about Chronic Care Management services, agreed to services, and gave verbal consent prior to initiation of services.  Please see initial visit note for detailed documentation.   Patient Care Team: Hoyt Koch, MD as PCP - General (Internal Medicine) Charlton Haws, [facility] as Pharmacist (Pharmacist)  Recent office visits: 02/02/21 Dr Sharlet Salina OV: f/u; d/c paroxetine, increase bupropion to 300 mg. Refill Vit D 50K weekly  12/02/20 Dr Sharlet Salina OV: f/u DOE, adjustment disorder. Rx'd paroxetine 10 mg. PFT referral, ordered ECHO. Rx'd Advair since prednisone has helped. D/C duloxetine, amlodipine, furosemide, potassium, trazodone  09/19/20 Dr. Sharlet Salina VV: Dyspnea on exertion;  refill tessalon perles, prednisone, check PFTs and labs. Rx'd Vitamin D 50K IU weekly.  Recent consult visits: 12/26/20 Dr Tamala Julian OV: back pain; Xray worsening arthritis. Referred to PT. Rec'd gabapentin 100 mg BID + 400 mg HS  12/23/20 PFT - normal lung volumes. Ordered CT to assess for PE 08/12/20 Dr Grandville Silos (hand surgery): f/u hand fracture.  Hospital visits: None in previous 6 months   Objective:  Lab Results  Component Value Date   CREATININE 0.82 10/15/2020   BUN 19 10/15/2020   GFR 74.23 10/15/2020   GFRNONAA >60 02/22/2020   GFRAA 95 05/01/2019   NA 142 10/15/2020   K 3.6 10/15/2020   CALCIUM 8.3 (L) 10/15/2020   CO2 23 10/15/2020   GLUCOSE 79 10/15/2020    Lab Results  Component Value Date/Time   HGBA1C 5.5 01/02/2019 02:03 PM   HGBA1C 5.2 12/11/2015 02:23 PM   GFR 74.23 10/15/2020 04:15 PM   GFR 84.06 08/12/2020 02:41 PM   MICROALBUR <0.7 01/02/2019 02:03 PM    Last diabetic Eye exam:  Lab Results  Component Value Date/Time   HMDIABEYEEXA No Retinopathy 12/09/2015 12:00 AM    Last diabetic Foot exam: No results found for: HMDIABFOOTEX   Lab Results  Component Value Date   CHOL 146 01/02/2019   HDL 73.40 01/02/2019   LDLCALC 60 01/02/2019   TRIG 66.0 01/02/2019   CHOLHDL 2 01/02/2019    Hepatic Function Latest Ref Rng & Units 10/15/2020 08/12/2020 02/22/2020  Total Protein 6.0 - 8.3 g/dL 6.1 6.6 6.5  Albumin 3.5 - 5.2 g/dL 3.6 3.6 3.6  AST 0 - 37 U/L 23  20 33  ALT 0 - 35 U/L 17 22 25   Alk Phosphatase 39 - 117 U/L 121(H) 127(H) 116  Total Bilirubin 0.2 - 1.2 mg/dL 0.4 0.3 0.3  Bilirubin, Direct 0.0 - 0.3 mg/dL - - -    Lab Results  Component Value Date/Time   TSH 0.55 10/15/2020 04:15 PM   TSH 0.98 01/02/2019 02:03 PM    CBC Latest Ref Rng & Units 08/12/2020 02/22/2020 10/11/2019  WBC 4.0 - 10.5 K/uL 8.8 6.1 6.3  Hemoglobin 12.0 - 15.0 g/dL 12.4 12.5 12.1  Hematocrit 36.0 - 46.0 % 38.2 39.1 37.5  Platelets 150.0 - 400.0 K/uL 217.0 198 191.0     Lab Results  Component Value Date/Time   VD25OH 13.86 (L) 02/02/2021 03:20 PM   VD25OH <7.00 (L) 10/15/2020 04:15 PM    Clinical ASCVD: No  The 10-year ASCVD risk score (Arnett DK, et al., 2019) is: 7.2%   Values used to calculate the score:     Age: 60 years     Sex: Female     Is Non-Hispanic African American: Yes     Diabetic: No     Tobacco smoker: No     Systolic Blood Pressure: 814 mmHg     Is BP treated: Yes     HDL Cholesterol: 73.4 mg/dL     Total Cholesterol: 146 mg/dL    Depression screen [facility] 2/9 12/02/2020 02/28/2020 10/26/2018  Decreased Interest 0 3 2  Down, Depressed, Hopeless 1 2 1   PHQ - 2 Score 1 5 3   Altered sleeping 1 3 2   Tired, decreased energy 3 3 1   Change in appetite 3 3 1   Feeling bad or failure about yourself  0 0 1  Trouble concentrating 0 0 1  Moving slowly or fidgety/restless 0 0 0  Suicidal thoughts - 0 0  PHQ-9 Score 8 14 9   Difficult doing work/chores - Somewhat difficult -  Some recent data might be hidden    GAD 7 : Generalized Anxiety Score 10/26/2018 05/19/2018  Nervous, Anxious, on Edge 1 1  Control/stop worrying 1 3  Worry too much - different things 1 3  Trouble relaxing 1 2  Restless 3 2  Easily annoyed or irritable 3 3  Afraid - awful might happen 2 1  Total GAD 7 Score 12 15     Social History   Tobacco Use  Smoking Status Former   Packs/day: 0.50   Years: 10.00   Pack years: 5.00   Types: Cigarettes   Quit date: 04/19/2009   Years since quitting: 11.8  Smokeless Tobacco Never   BP Readings from Last 3 Encounters:  02/02/21 126/78  12/26/20 126/80  12/02/20 126/90   Pulse Readings from Last 3 Encounters:  02/02/21 62  12/26/20 67  12/02/20 63   Wt Readings from Last 3 Encounters:  02/02/21 214 lb 6.4 oz (97.3 kg)  12/26/20 218 lb (98.9 kg)  12/02/20 222 lb 3.2 oz (100.8 kg)   BMI Readings from Last 3 Encounters:  02/02/21 34.61 kg/m  12/26/20 35.19 kg/m  12/02/20 35.86 kg/m     Assessment/Interventions: Review of patient past medical history, allergies, medications, health status, including review of consultants reports, laboratory and other test data, was performed as part of comprehensive evaluation and provision of chronic care management services.   SDOH:  (Social Determinants of Health) assessments and interventions performed: Yes  SDOH Screenings   Alcohol Screen: Low Risk    Last Alcohol Screening Score (AUDIT): 1  Depression (PHQ2-9): Medium Risk   PHQ-2 Score: 8  Financial Resource Strain: Medium Risk   Difficulty of Paying Living Expenses: Somewhat hard  Food Insecurity: Food Insecurity Present   Worried About Charity fundraiser in the Last Year: Sometimes true   Arboriculturist in the Last Year: Sometimes true  Housing: [place] Risk Score: 0  Physical Activity: Unknown   Days of Exercise per Week: 0 days   Minutes of Exercise per Session: Not on file  Social Connections: Unknown   Frequency of Communication with Friends and Family: More than three times a week   Frequency of Social Gatherings with Friends and Family: Once a week   Attends Religious Services: Patient refused   Active Member of Clubs or Organizations: Yes   Attends Archivist Meetings: 1 to 4 times per year   Marital Status: Widowed  Stress: Stress Concern Present   Feeling of Stress : To some extent  Tobacco Use: Medium Risk   Smoking Tobacco Use: Former   Smokeless Tobacco Use: Never   Passive Exposure: Not on file  Transportation Needs: Unmet Transportation Needs   Lack of Transportation (Medical): Yes   Lack of Transportation (Non-Medical): No    CCM Care Plan  Allergies  Allergen Reactions   Carafate [Sucralfate] Hives, Itching and Other (See Comments)    Angioedema      Sulfonamide Derivatives Rash    Syncope    Medications Reviewed Today     Reviewed by Charlton Haws, [facility] (Pharmacist) on 02/10/21 at 1428  Med List  Status: <None>   Medication Order Taking? Sig Documenting Provider Last Dose Status Informant  ACCU-CHEK SOFTCLIX LANCETS lancets [id number] Yes CHECK BLOOD SUGARS TWICE DAILY Hoyt Koch, MD Taking Active   acetaminophen (TYLENOL) 500 MG tablet [id number] Yes Take 500 mg by mouth every 6 (six) hours as needed. [provider] Taking Active   albuterol (VENTOLIN HFA) 108 (90 Base) MCG/ACT inhaler [id number] Yes INHALE 2 PUFFS INTO THE LUNGS EVERY 6 HOURS AS NEEDED FOR WHEEZING OR SHORTNESS OF Hoover Brunette, MD Taking Active   Alcohol Swabs (B-D SINGLE USE SWABS REGULAR) PADS [id number] Yes USE AS DIRECTED EVERY DAY Hoyt Koch, MD Taking Active   amLODipine (NORVASC) 10 MG tablet [id number] Yes Take 10 mg by mouth daily. [provider] Taking Active   Blood Glucose Calibration (ACCU-CHEK AVIVA) SOLN [id number] Yes Use as directed Hoyt Koch, MD Taking Active Self  Blood Glucose Monitoring Suppl (ACCU-CHEK AVIVA PLUS) w/Device KIT [id number] Yes USE AS DIRECTED  TO CHECK BLOOD SUGAR EVERY DAY Hoyt Koch, MD Taking Active   buPROPion (WELLBUTRIN XL) 300 MG 24 hr tablet [id number] Yes Take 1 tablet (300 mg total) by mouth daily. Hoyt Koch, MD Taking Active   Cholecalciferol (VITAMIN D) 125 MCG (5000 UT) CAPS [id number] Yes Take by mouth. [provider] Taking Active   fluticasone-salmeterol (ADVAIR) 100-50 MCG/ACT AEPB [id number] Yes Inhale 1 puff into the lungs 2 (two) times daily. Hoyt Koch, MD Taking Active   gabapentin (NEURONTIN) 100 MG capsule [id number] Yes Take 1 capsule (100 mg total) by mouth 2 (two) times daily. Lyndal Pulley, DO Taking Active   glucose blood (ACCU-CHEK AVIVA PLUS) test strip [id number] Yes 1 each by Other route 2 (two) times daily. Use to check blood sugars twice a day Hoyt Koch, MD Taking Active   lipase/protease/amylase (CREON) 36000 UNITS CPEP  capsule [id number]  Yes Take 1 capsule by mouth in the morning, at noon, in the evening, and at bedtime. [provider] Taking Active   PARoxetine (PAXIL) 10 MG tablet [id number] Yes Take 10 mg by mouth daily. [provider] Taking Active   traMADol (ULTRAM) 50 MG tablet [id number] Yes TAKE 1 TABLET(50 MG) BY MOUTH FOUR TIMES DAILY Hoyt Koch, MD Taking Active             Patient Active Problem List   Diagnosis Date Noted   Vitamin D deficiency 02/04/2021   Chest pain on breathing 08/11/2020   Closed fracture of left distal radius 05/01/2020   Recurrent Clostridioides difficile infection 02/28/2020   Pyelonephritis 02/28/2020   Abnormal urine odor 08/10/2019   Gastrointestinal complaint 07/05/2019   Muscle strain of gluteal region, right, initial encounter 05/11/2019   DOE (dyspnea on exertion) 03/13/2019   Rapid heartbeat 26/20/3559   Systolic ejection murmur 74/16/3845   Leg swelling 10/09/2018   Insomnia 05/19/2018   Frequent refractory urinary tract infections 05/05/2018   Right leg pain 12/16/2017   History of deep vein thrombosis (DVT) of lower extremity 12/12/2017   Cavovarus deformity of foot, acquired, unspecified laterality 04/07/2017   S/P gastric bypass 08/18/2016   Muscle cramps 07/09/2016   Adjustment disorder with mixed anxiety and depressed mood 03/05/2016   Fatigue 03/05/2016   Cervical disc disorder with radiculopathy of cervical region 02/27/2016   Degenerative arthritis of right knee 02/27/2016   Right knee pain 02/24/2016   Left shoulder pain 02/24/2016   Routine general medical examination at a health care facility 12/12/2015   Schatzki's ring    Lumbar radiculopathy 03/05/2015   Peripheral neuropathy 01/17/2015   Chest pain with moderate risk for cardiac etiology 05/21/2013   Fibromyalgia 02/21/2011   Essential hypertension 12/04/2006   Hx of diabetes mellitus 10/20/2006   Morbid obesity ([place]) 10/20/2006   MDD (major depressive disorder)  ([place]) 10/20/2006   OBSTRUCTIVE SLEEP APNEA 10/20/2006   OSTEOARTHRITIS 10/20/2006    Immunization History  Administered Date(s) Administered   Fluad Quad(high Dose 65+) 12/26/2020   Influenza Split 02/01/2007, 12/16/2017   Influenza Whole 02/01/2007   Influenza,inj,Quad PF,6+ Mos 12/16/2017   Influenza-Unspecified 02/19/2013, 05/20/2014, 02/01/2016, 12/17/2016, 12/21/2018, 02/19/2020   Moderna Sars-Covid-2 Vaccination 05/13/2019, 06/10/2019, 02/19/2020, 08/12/2020, 12/26/2020   Pneumococcal Conjugate-13 01/02/2019   Td 11/12/1997   Tdap 11/12/1997, 08/07/2014   Zoster Recombinat (Shingrix) 10/11/2019    Conditions to be addressed/monitored:  Hypertension, Depression, and Anxiety, Osteoarthritis, DOE  Care Plan : [place]  Updates made by Charlton Haws, [place] since 02/10/2021 12:00 AM     Problem: Hypertension, Depression, and Anxiety, Osteoarthritis, DOE   Priority: High     Long-Range Goal: Disease management   Start Date: 11/12/2020  Expected End Date: 11/12/2021  Recent Progress: On track  Priority: High  Note:   Current Barriers:  Unable to independently monitor therapeutic efficacy  Pharmacist Clinical Goal(s):  Patient will achieve adherence to monitoring guidelines and medication adherence to achieve therapeutic efficacy through collaboration with PharmD and provider.   Interventions: 1:1 collaboration with Hoyt Koch, MD regarding development and update of comprehensive plan of care as evidenced by provider attestation and co-signature Inter-disciplinary care team collaboration (see longitudinal plan of care) Comprehensive medication review performed; medication list updated in electronic medical record  Hypertension    BP goal is:  <130/80 Patient checks BP at home 1-2x per week Patient home BP readings are ranging: SBP  120s   Patient has failed these meds in the past: metoprolol, lisinopril-HCTZ, amlodipine Patient is  currently controlled on the following medications: Amlodipine 10 mg daily   We discussed: pt reports she restarted amlodipine after seeing elevated BP at home; she reports BP is under control since restarting  Plan: Continue current medications   Depression / Adjustment disorder  -Not ideally controlled - pt reports she is taking bupropion 150 mg twice daily (she thought she had 100 mg dose but it was actually 150 mg); recently PCP changed dose to 300 mg, so this was not an increase in dose; pt reports mood is not ideally controlled on bupropion alone; she is also taking PM dose of bupropion and reports trouble sleeping -Current treatment: Bupropion SR 150 mg BID (AM and PM) -Medications previously tried/failed: duloxetine, paroxetine -PHQ9: 8 (11/2020) -Connected with PCP for mental health support -Educated on Benefits of medication for symptom control -Advised pt to take all of bupropion in the AM to prevent sleep issues -Advised pt to restart paroxetine 10 mg daily  Osteoarthritis  -Controlled - pt reports pain is relatively controlled with current regimen -Current treatment  Gabapentin 100 mg BID Tramadol 50 mg BID Tylenol 500 mg PRN -Recommended to continue current medication  SOB / DOE  -Controlled - pt reports SOB is much improved with Wixela, she has not been using albuterol -PFT 12/2020 - lung volumes normal -Current treatment  Wixela 100-50 mcg/act 1 puff BID Albuterol HFA prn -Counseled on components of Wixela and benefits of regular use; advised she could use albuterol as needed for SOB -Recommended to continue current medication  Patient Goals/Self-Care Activities Patient will:  - take medications as prescribed focus on medication adherence by routine -Restart paroxetine 10 mg daily -Move bupropion to AM  -Get Pneumovax/Prevnar-20 and Shingrix dose number 2 at local pharmacy       Medication Assistance: None required.  Patient affirms current coverage meets  needs.  Compliance/Adherence/Medication fill history: Care Gaps: DEXA scan - scheduled 03/2021 Colonoscopy (due 04/20/19)  Star-Rating Drugs: None  Patient's preferred pharmacy is:  [id number] - [address], [place] [address] Phone: [phone] Fax: [phone]  [facility], [address], [place] [address] Phone: [phone] Fax: [phone]  [address], [place] [address] Phone: [phone] Fax: [phone]  Uses pill box? No - prefers bottles Pt endorses 100% compliance  We discussed: Current pharmacy is preferred with insurance plan and patient is satisfied with pharmacy services Patient decided to: Continue current medication management strategy  Care Plan and Follow Up Patient Decision:  Patient agrees to Care Plan and Follow-up.  Plan: Telephone follow up appointment with care management team member scheduled for:  3 months  Charlene Brooke, PharmD, [place], CPP Clinical Pharmacist [facility] at [facility] [phone]

## 2021-02-16 DIAGNOSIS — I10 Essential (primary) hypertension: Secondary | ICD-10-CM

## 2021-02-16 DIAGNOSIS — F331 Major depressive disorder, recurrent, moderate: Secondary | ICD-10-CM

## 2021-03-05 NOTE — Progress Notes (Signed)
[place] [facility] Phone: [phone] Subjective:   [name], am serving as a scribe for Dr. Hulan Saas. This visit occurred during the SARS-CoV-2 public health emergency.  Safety protocols were in place, including screening questions prior to the visit, additional usage of staff PPE, and extensive cleaning of exam room while observing appropriate contact time as indicated for disinfecting solutions.   I'm seeing this patient by the request  of:  Hoyt Koch, MD  CC: Back pain and hip pain follow-up  LKT:[id number]  12/26/2020 Patient has had difficulty for some years and seems to be potentially worsening.  We will get x-rays.  Concern for more radicular symptoms and patient does have a appears to be some weakness of the shoulder girdle.  Rotator cuff strength though does appear to be intact.  With weakness to the shoulder girdle we will continue to monitor.  Patient is having multiple different issues over the course of [place].  Follow-up with me again  Updated 03/06/2021 EVORA SCHECHTER is a 65 y.o. female coming in with complaint of right shoulder and neck pain. Patient states that her pain is less due to using gabapentin. Using $RemoveBeforeDEI'100mg'yiQRffAVknoIxBwY$  in morning and $RemoveBef'100mg'ObihRCwYXE$  QHS. Pain occurring mostly at night.  Patient feels like it is made some progress in the arm at the moment.  Xray IMPRESSION: Progressive osteoarthritic change at C3-C4 and C5-C6.  IMPRESSION: No fracture or dislocation of the right shoulder.   Mild glenohumeral and acromioclavicular degenerative change, minimally increased from prior.       Past Medical History:  Diagnosis Date   Anemia    takes Ferrous Sulfate daily   Anxiety    takes Citalopram daily   Arthritis    Asthma    2004-prior to gastric bypass and no problems since   CHF (congestive heart failure) ([place])    takes Lasix daily as needed   Chronic back pain     spondylolisthesis/stenosis/radiculopathy   Complication of anesthesia yrs ago   slow to wake up   Depression    Diabetes ([place])    DVT (deep venous thrombosis) ([place]) 01/17/2013   past hx. -tx.5-6 yrs ago bilateral legs, occ. sporadic swelling, has IVC filter implanted   Dysrhythmia    "heart tends to flutter"   Fibromyalgia    Fracture    right foot and is in a cam boot   Gallstones    GERD (gastroesophageal reflux disease)    hx of-no meds now   Heart murmur    History of bronchitis 2012 or 2013   History of colon polyps    Hypertension    takes Metoprolol daily   Insomnia    takes Melatonin daily   Pelvic floor dysfunction    Peripheral neuropathy    Pneumonia 90's   hx of   S/P gastric bypass 2003   Sleep apnea    no cpap used in many yrs after weight lost-no machine now   Urinary frequency    Urinary urgency    Vitamin D deficiency    Past Surgical History:  Procedure Laterality Date   BALLOON DILATION N/A 01/31/2013   Procedure: BALLOON DILATION;  Surgeon: Arta Silence, MD;  Location: [facility];  Service: Endoscopy;  Laterality: N/A;   CARDIAC EVENT MONITOR  03/2019   Predominantly sinus rhythm.  Rates range from 48-124 bpm.  Average 74 bpm.  Frequent short bursts (3-15 beats) PAT/PSVT--not indicated as being symptomatic on  diary.  Otherwise rare PACs and PVCs.   CHOLECYSTECTOMY  2007   COLONOSCOPY     DILATION AND CURETTAGE OF UTERUS  yrs ago   ESOPHAGOGASTRODUODENOSCOPY (EGD) WITH PROPOFOL  03/01/2012   Procedure: ESOPHAGOGASTRODUODENOSCOPY (EGD) WITH PROPOFOL;  Surgeon: Arta Silence, MD;  Location: [facility];  Service: Endoscopy;  Laterality: N/A;   ESOPHAGOGASTRODUODENOSCOPY (EGD) WITH PROPOFOL N/A 01/31/2013   Procedure: ESOPHAGOGASTRODUODENOSCOPY (EGD) WITH PROPOFOL;  Surgeon: Arta Silence, MD;  Location: [facility];  Service: Endoscopy;  Laterality: N/A;   ESOPHAGOGASTRODUODENOSCOPY (EGD) WITH PROPOFOL N/A 09/02/2015   Procedure:  ESOPHAGOGASTRODUODENOSCOPY (EGD) WITH PROPOFOL;  Surgeon: Gatha Mayer, MD;  Location: [facility];  Service: Endoscopy;  Laterality: N/A;   GASTRIC BY-PASS  2004   HERNIA REPAIR  2005   INSERTION OF VENA CAVA FILTER  01-17-13   inserted 2004- "abdomen"   LIGAMENT REPAIR Right 1987   Rt. knee scope   MAXIMUM ACCESS (MAS)POSTERIOR LUMBAR INTERBODY FUSION (PLIF) 2 LEVEL N/A 06/07/2014   Procedure: L4-5 L5-S1 FOR MAXIMUM ACCESS (MAS) POSTERIOR LUMBAR INTERBODY FUSION ;  Surgeon: Erline Levine, MD;  Location: [facility];  Service: Neurosurgery;  Laterality: N/A;  L4-5 L5-S1 FOR MAXIMUM ACCESS (MAS) POSTERIOR LUMBAR INTERBODY FUSION    TONSILLECTOMY     as child   TRANSTHORACIC ECHOCARDIOGRAM  03/2019   EF 60 to 65%.  Mild LVH.  No R WMA.  Normal RV size.  Normal LA, mild RA dilation.  No aortic valve stenosis or sclerosis.   Social History   Socioeconomic History   Marital status: Widowed    Spouse name: Not on file   Number of children: 0   Years of education: college   Highest education level: Master's degree (e.g., MA, MS, MEng, MEd, MSW, MBA)  Occupational History   Occupation: retired  Tobacco Use   Smoking status: Former    Packs/day: 0.50    Years: 10.00    Pack years: 5.00    Types: Cigarettes    Quit date: 04/19/2009    Years since quitting: 11.8   Smokeless tobacco: Never  Substance and Sexual Activity   Alcohol use: Yes    Alcohol/week: 0.0 standard drinks    Comment: occ. social- wine-1 drink monthly   Drug use: No   Sexual activity: Not Currently  Other Topics Concern   Not on file  Social History Narrative   Widow.  No kids.  Lives with her mother who is 77.  She does walk with a cane.  She is a retired Investment banker, corporate   She quit smoking in 2003   Social Determinants of Health   Financial Resource Strain: Medium Risk   Difficulty of Paying Living Expenses: Somewhat hard  Food Insecurity: Landscape architect Present   Worried About Charity fundraiser in  the Last Year: Sometimes true   Arboriculturist in the Last Year: Sometimes true  Transportation Needs: Public librarian (Medical): Yes   Lack of Transportation (Non-Medical): No  Physical Activity: Unknown   Days of Exercise per Week: 0 days   Minutes of Exercise per Session: Not on file  Stress: Stress Concern Present   Feeling of Stress : To some extent  Social Connections: Unknown   Frequency of Communication with Friends and Family: More than three times a week   Frequency of Social Gatherings with Friends and Family: Once a week   Attends Religious Services: Patient refused   Active Member of Clubs or  Organizations: Yes   Attends Archivist Meetings: 1 to 4 times per year   Marital Status: Widowed   Allergies  Allergen Reactions   Carafate [Sucralfate] Hives, Itching and Other (See Comments)    Angioedema      Sulfonamide Derivatives Rash    Syncope   Family History  Problem Relation Age of Onset   Diabetes Mother    Atrial fibrillation Mother    Hypertension Mother    Heart disease Father        Unsure of details   Hypertension Father    Prostate cancer Father    Alzheimer's disease Father    Kidney disease Brother    Healthy Sister    Diabetes Maternal Grandmother    Heart disease Maternal Grandmother    Stroke Maternal Grandfather 52   Hypertension Maternal Grandfather    Hypertension Paternal Grandmother    Alzheimer's disease Paternal Grandmother    Colon cancer Neg Hx    Colon polyps Neg Hx    Stomach cancer Neg Hx    Esophageal cancer Neg Hx    Pancreatic cancer Neg Hx    Liver disease Neg Hx      Current Outpatient Medications (Cardiovascular):    amLODipine (NORVASC) 10 MG tablet, TAKE 1 TABLET EVERY DAY  Current Outpatient Medications (Respiratory):    albuterol (VENTOLIN HFA) 108 (90 Base) MCG/ACT inhaler, INHALE 2 PUFFS INTO THE LUNGS EVERY 6 HOURS AS NEEDED FOR WHEEZING OR SHORTNESS OF BREATH    fluticasone-salmeterol (ADVAIR) 100-50 MCG/ACT AEPB, Inhale 1 puff into the lungs 2 (two) times daily.  Current Outpatient Medications (Analgesics):    acetaminophen (TYLENOL) 500 MG tablet, Take 500 mg by mouth every 6 (six) hours as needed.   traMADol (ULTRAM) 50 MG tablet, TAKE 1 TABLET(50 MG) BY MOUTH FOUR TIMES DAILY   Current Outpatient Medications (Other):    ACCU-CHEK SOFTCLIX LANCETS lancets, CHECK BLOOD SUGARS TWICE DAILY   Alcohol Swabs (B-D SINGLE USE SWABS REGULAR) PADS, USE AS DIRECTED EVERY DAY   Blood Glucose Calibration (ACCU-CHEK AVIVA) SOLN, Use as directed   Blood Glucose Monitoring Suppl (ACCU-CHEK AVIVA PLUS) w/Device KIT, USE AS DIRECTED  TO CHECK BLOOD SUGAR EVERY DAY   buPROPion (WELLBUTRIN XL) 300 MG 24 hr tablet, Take 1 tablet (300 mg total) by mouth daily.   Cholecalciferol (VITAMIN D) 125 MCG (5000 UT) CAPS, Take by mouth.   gabapentin (NEURONTIN) 100 MG capsule, Take 2 capsules (200 mg total) by mouth 2 (two) times daily.   glucose blood (ACCU-CHEK AVIVA PLUS) test strip, 1 each by Other route 2 (two) times daily. Use to check blood sugars twice a day   lipase/protease/amylase (CREON) 36000 UNITS CPEP capsule, Take 1 capsule by mouth in the morning, at noon, in the evening, and at bedtime.   PARoxetine (PAXIL) 10 MG tablet, Take 10 mg by mouth daily.   Reviewed prior external information including notes and imaging from  primary care provider As well as notes that were available from care everywhere and other healthcare systems.  Past medical history, social, surgical and family history all reviewed in electronic medical record.  No pertanent information unless stated regarding to the chief complaint.   Review of Systems:  No headache, visual changes, nausea, vomiting, diarrhea, constipation, dizziness, abdominal pain, skin rash, fevers, chills, night sweats, weight loss, swollen lymph nodes, body aches, joint swelling, chest pain, shortness of breath, mood  changes. POSITIVE muscle aches  Objective  Blood pressure 130/72, pulse 79, height 5'  6" (1.676 m), weight 215 lb (97.5 kg), SpO2 97 %.   General: No apparent distress alert and oriented x3 mood and affect normal, dressed appropriately.  HEENT: Pupils equal, extraocular movements intact  Respiratory: Patient's speak in full sentences and does not appear short of breath  Cardiovascular: No lower extremity edema, non tender, no erythema  Gait normal with good balance and coordination.  MSK: Neck exam still has some loss of lordosis.  He does have worsening pain with Spurling's and mild radicular symptoms in the C5 and C7 distribution.  Patient on exam also does have some limited range of motion of the left hip.  Patient also has some severe tenderness to palpation over the greater trochanteric area.    Procedure: Real-time Ultrasound Guided Injection of left  greater trochanteric bursitis secondary to patient's body habitus Device: GE Logiq Q7  Ultrasound guided injection is preferred based studies that show increased duration, increased effect, greater accuracy, decreased procedural pain, increased response rate, and decreased cost with ultrasound guided versus blind injection.  Verbal informed consent obtained.  Time-out conducted.  Noted no overlying erythema, induration, or other signs of local infection.  Skin prepped in a sterile fashion.  Local anesthesia: Topical Ethyl chloride.  With sterile technique and under real time ultrasound guidance:  Greater trochanteric area was visualized and patient's bursa was noted. A 22-gauge 3 inch needle was inserted and 4 cc of 0.5% [place] and 1 cc of Kenalog 40 mg/dL was injected. Pictures taken Completed without difficulty  Pain immediately resolved suggesting accurate placement of the medication.  Advised to call if fevers/chills, erythema, induration, drainage, or persistent bleeding.  Impression: Technically successful ultrasound guided  injection.    Impression and Recommendations:     The above documentation has been reviewed and is accurate and complete Lyndal Pulley, DO

## 2021-03-06 ENCOUNTER — Ambulatory Visit (INDEPENDENT_AMBULATORY_CARE_PROVIDER_SITE_OTHER): Payer: Medicare HMO | Admitting: Family Medicine

## 2021-03-06 ENCOUNTER — Ambulatory Visit (INDEPENDENT_AMBULATORY_CARE_PROVIDER_SITE_OTHER): Payer: Medicare HMO

## 2021-03-06 ENCOUNTER — Encounter: Payer: Self-pay | Admitting: Family Medicine

## 2021-03-06 ENCOUNTER — Ambulatory Visit: Payer: Self-pay

## 2021-03-06 VITALS — BP 130/72 | HR 79 | Ht 66.0 in | Wt 215.0 lb

## 2021-03-06 DIAGNOSIS — M5010 Cervical disc disorder with radiculopathy, unspecified cervical region: Secondary | ICD-10-CM

## 2021-03-06 DIAGNOSIS — M25552 Pain in left hip: Secondary | ICD-10-CM

## 2021-03-06 DIAGNOSIS — M25511 Pain in right shoulder: Secondary | ICD-10-CM

## 2021-03-06 MED ORDER — GABAPENTIN 100 MG PO CAPS: 200.0000 mg | ORAL_CAPSULE | Freq: Two times a day (BID) | ORAL | 0 refills | Status: DC

## 2021-03-06 NOTE — Patient Instructions (Signed)
Injected the hip today Gabapentin 200mg  at night  Tylenol with Tramadol See me again in 7-8 weeks

## 2021-03-06 NOTE — Assessment & Plan Note (Signed)
Patient given injection on the greater trochanteric area and then see if this will be beneficial.  In addition to this though does have some limited internal range of motion and likely some underlying arthritic changes are noted.  This may be somewhat concerning long-term but hopefully the injection does help for now.  Follow-up again in 6 to 8 weeks.

## 2021-03-06 NOTE — Assessment & Plan Note (Signed)
Patient is doing relatively well with conservative therapy.  We discussed potentially increasing the gabapentin and warned the potential side effects.  Patient would like to try it.  Discussed icing regimen and home exercises.  Patient will see how she responds and can always go back to 100 mg twice a day.

## 2021-03-17 ENCOUNTER — Telehealth (INDEPENDENT_AMBULATORY_CARE_PROVIDER_SITE_OTHER): Payer: Medicare HMO | Admitting: Internal Medicine

## 2021-03-17 DIAGNOSIS — J069 Acute upper respiratory infection, unspecified: Secondary | ICD-10-CM

## 2021-03-17 MED ORDER — ONDANSETRON HCL 4 MG PO TABS: 4.0000 mg | ORAL_TABLET | Freq: Three times a day (TID) | ORAL | 0 refills | Status: DC | PRN

## 2021-03-17 MED ORDER — PROMETHAZINE-DM 6.25-15 MG/5ML PO SYRP: 5.0000 mL | ORAL_SOLUTION | Freq: Four times a day (QID) | ORAL | 0 refills | Status: DC | PRN

## 2021-03-17 NOTE — Progress Notes (Signed)
Virtual Visit via Video Note  I connected with [name] on 03/17/21 at  1:00 PM EST by a video enabled telemedicine application and verified that I am speaking with the correct person using two identifiers.  The patient and the provider were at separate locations throughout the entire encounter. Patient location: home, Provider location: work   I discussed the limitations of evaluation and management by telemedicine and the availability of in person appointments. The patient expressed understanding and agreed to proceed. The patient and the provider were the only parties present for the visit unless noted in HPI below.  History of Present Illness: The patient is a 65 y.o. female with visit for being sick right before thanksgiving. Overall it is worsening. Has tried otc which has not helped much.  Observations/Objective: Appearance: appears sick, breathing appears normal, minimal coughing during visit, casual grooming, A and O times 3  Assessment and Plan: See problem oriented charting  Follow Up Instructions: rx zofran and promethazine/dm, likely viral antibiotics are not indicated at this time  I discussed the assessment and treatment plan with the patient. The patient was provided an opportunity to ask questions and all were answered. The patient agreed with the plan and demonstrated an understanding of the instructions.   The patient was advised to call back or seek an in-person evaluation if the symptoms worsen or if the condition fails to improve as anticipated.  Hoyt Koch, MD

## 2021-03-18 ENCOUNTER — Encounter: Payer: Self-pay | Admitting: Internal Medicine

## 2021-03-18 NOTE — Assessment & Plan Note (Signed)
She did 1 home covid-19 test which was negative. Could be flu as this is prevalent in the community currently. She is outside window for antiviral regardless of cause. Supportive care is indicated. Rx zofran for nausea and promethazine/dm for cough. If no improvement by day 10 call back and further treatment may be indicated.

## 2021-03-20 ENCOUNTER — Encounter: Payer: Self-pay | Admitting: Internal Medicine

## 2021-03-20 MED ORDER — AMOXICILLIN-POT CLAVULANATE 875-125 MG PO TABS: 1.0000 | ORAL_TABLET | Freq: Two times a day (BID) | ORAL | 0 refills | Status: AC

## 2021-03-30 ENCOUNTER — Encounter: Payer: Self-pay | Admitting: Internal Medicine

## 2021-03-30 MED ORDER — PREDNISONE 20 MG PO TABS
40.0000 mg | ORAL_TABLET | Freq: Every day | ORAL | 0 refills | Status: DC
Start: 2021-03-30 — End: 2021-07-09

## 2021-04-01 ENCOUNTER — Telehealth: Payer: Self-pay

## 2021-04-01 NOTE — Chronic Care Management (AMB) (Signed)
° ° °  Chronic Care Management °Pharmacy Assistant  ° °Name: [name]  MRN: [id number] DOB: 11/03/1955 ° ° °Reason for Encounter: Disease State-General °  ° °Recent office visits:  °None ID ° °Recent consult visits:  °02/24/21 Smith, Zachary M, DO-Sports Medicine (pain in joint of right shoulder) Orders: DG Hip Unilat with pelvis 2-3 views, US Limited joint space structures up right, Medication changes: Gabapentin 200 mg twice daily ° °Hospital visits:  °None in previous 6 months ° °Medications: °Outpatient Encounter Medications as of 04/01/2021  °Medication Sig  ° ACCU-CHEK SOFTCLIX LANCETS lancets CHECK BLOOD SUGARS TWICE DAILY  ° acetaminophen (TYLENOL) 500 MG tablet Take 500 mg by mouth every 6 (six) hours as needed.  ° albuterol (VENTOLIN HFA) 108 (90 Base) MCG/ACT inhaler INHALE 2 PUFFS INTO THE LUNGS EVERY 6 HOURS AS NEEDED FOR WHEEZING OR SHORTNESS OF BREATH  ° Alcohol Swabs (B-D SINGLE USE SWABS REGULAR) PADS USE AS DIRECTED EVERY DAY  ° amLODipine (NORVASC) 10 MG tablet TAKE 1 TABLET EVERY DAY  ° Blood Glucose Calibration (ACCU-CHEK AVIVA) SOLN Use as directed  ° Blood Glucose Monitoring Suppl (ACCU-CHEK AVIVA PLUS) w/Device KIT USE AS DIRECTED  TO CHECK BLOOD SUGAR EVERY DAY  ° buPROPion (WELLBUTRIN XL) 300 MG 24 hr tablet Take 1 tablet (300 mg total) by mouth daily.  ° Cholecalciferol (VITAMIN D) 125 MCG (5000 UT) CAPS Take by mouth.  ° fluticasone-salmeterol (ADVAIR) 100-50 MCG/ACT AEPB Inhale 1 puff into the lungs 2 (two) times daily.  ° gabapentin (NEURONTIN) 100 MG capsule Take 2 capsules (200 mg total) by mouth 2 (two) times daily.  ° glucose blood (ACCU-CHEK AVIVA PLUS) test strip 1 each by Other route 2 (two) times daily. Use to check blood sugars twice a day  ° lipase/protease/amylase (CREON) 36000 UNITS CPEP capsule Take 1 capsule by mouth in the morning, at noon, in the evening, and at bedtime.  ° ondansetron (ZOFRAN) 4 MG tablet Take 1 tablet (4 mg total) by mouth every 8 (eight) hours  as needed for nausea or vomiting.  ° PARoxetine (PAXIL) 10 MG tablet Take 10 mg by mouth daily.  ° predniSONE (DELTASONE) 20 MG tablet Take 2 tablets (40 mg total) by mouth daily with breakfast.  ° promethazine-dextromethorphan (PROMETHAZINE-DM) 6.25-15 MG/5ML syrup Take 5 mLs by mouth 4 (four) times daily as needed for cough.  ° traMADol (ULTRAM) 50 MG tablet TAKE 1 TABLET(50 MG) BY MOUTH FOUR TIMES DAILY  ° °No facility-administered encounter medications on file as of 04/01/2021.  ° ° °Reviewed chart for medication changes and drug therapy problems ahead of medication adherence call. ° °Attempted to contact patient x 3 for medication review and health check, unable to reach patient, left voicemails to return call. ° °  °Care Gaps: °Colonoscopy-04/19/09 °Diabetic Foot Exam-NA °Mammogram-01/15/21 °Ophthalmology-NA °Dexa Scan - NA °Annual Well Visit - 12/02/20 °Micro albumin-NA °Hemoglobin A1c- NA ° °Star Rating Drugs: °None ID ° °Tracy Ellis,CMA °Clinical Pharmacist Assistant °[phone]  °

## 2021-04-02 ENCOUNTER — Ambulatory Visit
Admission: RE | Admit: 2021-04-02 | Discharge: 2021-04-02 | Disposition: A | Discharge status: Home / Self Care | Payer: Medicare HMO | Source: Ambulatory Visit | Attending: Internal Medicine | Admitting: Internal Medicine

## 2021-04-02 DIAGNOSIS — E2839 Other primary ovarian failure: Secondary | ICD-10-CM

## 2021-04-02 DIAGNOSIS — Z780 Asymptomatic menopausal state: Secondary | ICD-10-CM | POA: Diagnosis not present

## 2021-04-02 DIAGNOSIS — M810 Age-related osteoporosis without current pathological fracture: Secondary | ICD-10-CM | POA: Diagnosis not present

## 2021-04-02 DIAGNOSIS — M85851 Other specified disorders of bone density and structure, right thigh: Secondary | ICD-10-CM | POA: Diagnosis not present

## 2021-04-03 ENCOUNTER — Encounter: Payer: Self-pay | Admitting: Internal Medicine

## 2021-04-03 MED ORDER — ALENDRONATE SODIUM 70 MG PO TABS: 70.0000 mg | ORAL_TABLET | ORAL | 3 refills | Status: DC

## 2021-04-23 NOTE — Progress Notes (Signed)
[facility] [address] [place] Phone: [phone] Subjective:   [name], am serving as a scribe for Dr. Hulan Saas. This visit occurred during the SARS-CoV-2 public health emergency.  Safety protocols were in place, including screening questions prior to the visit, additional usage of staff PPE, and extensive cleaning of exam room while observing appropriate contact time as indicated for disinfecting solutions.   I'm seeing this patient by the request  of:  Hoyt Koch, MD  CC: neck and hip pain   UUE:[id number]  03/06/2021 Patient given injection on the greater trochanteric area and then see if this will be beneficial.  In addition to this though does have some limited internal range of motion and likely some underlying arthritic changes are noted.  This may be somewhat concerning long-term but hopefully the injection does help for now.  Follow-up again in 6 to 8 weeks.  Patient is doing relatively well with conservative therapy.  We discussed potentially increasing the gabapentin and warned the potential side effects.  Patient would like to try it.  Discussed icing regimen and home exercises.  Patient will see how she responds and can always go back to 100 mg twice a day.  Updated 04/24/2021 TEMA ALIRE is a 66 y.o. female coming in with complaint of neck and hip pain shoulder pain only when she moves a certain way sporadically and pain radiates down through arm. Right knee gave way about 2 weeks ago. Since then has been okay. That pain is also sporadic. Hip is doing well.  Patient states that the pain in the knee seems to be more on the medial aspect.  We have seen patient previously and does have known moderate arthritic changes of the knee.       Past Medical History:  Diagnosis Date   Anemia    takes Ferrous Sulfate daily   Anxiety    takes Citalopram daily   Arthritis    Asthma    2004-prior to  gastric bypass and no problems since   CHF (congestive heart failure) ([place])    takes Lasix daily as needed   Chronic back pain    spondylolisthesis/stenosis/radiculopathy   Complication of anesthesia yrs ago   slow to wake up   Depression    Diabetes ([place])    DVT (deep venous thrombosis) ([place]) 01/17/2013   past hx. -tx.5-6 yrs ago bilateral legs, occ. sporadic swelling, has IVC filter implanted   Dysrhythmia    "heart tends to flutter"   Fibromyalgia    Fracture    right foot and is in a cam boot   Gallstones    GERD (gastroesophageal reflux disease)    hx of-no meds now   Heart murmur    History of bronchitis 2012 or 2013   History of colon polyps    Hypertension    takes Metoprolol daily   Insomnia    takes Melatonin daily   Pelvic floor dysfunction    Peripheral neuropathy    Pneumonia 90's   hx of   S/P gastric bypass 2003   Sleep apnea    no cpap used in many yrs after weight lost-no machine now   Urinary frequency    Urinary urgency    Vitamin D deficiency    Past Surgical History:  Procedure Laterality Date   BALLOON DILATION N/A 01/31/2013   Procedure: BALLOON DILATION;  Surgeon: Arta Silence, MD;  Location: [facility];  Service: Endoscopy;  Laterality: N/A;   CARDIAC EVENT MONITOR  03/2019   Predominantly sinus rhythm.  Rates range from 48-124 bpm.  Average 74 bpm.  Frequent short bursts (3-15 beats) PAT/PSVT--not indicated as being symptomatic on diary.  Otherwise rare PACs and PVCs.   CHOLECYSTECTOMY  2007   COLONOSCOPY     DILATION AND CURETTAGE OF UTERUS  yrs ago   ESOPHAGOGASTRODUODENOSCOPY (EGD) WITH PROPOFOL  03/01/2012   Procedure: ESOPHAGOGASTRODUODENOSCOPY (EGD) WITH PROPOFOL;  Surgeon: Arta Silence, MD;  Location: [facility];  Service: Endoscopy;  Laterality: N/A;   ESOPHAGOGASTRODUODENOSCOPY (EGD) WITH PROPOFOL N/A 01/31/2013   Procedure: ESOPHAGOGASTRODUODENOSCOPY (EGD) WITH PROPOFOL;  Surgeon: Arta Silence, MD;  Location: [facility];   Service: Endoscopy;  Laterality: N/A;   ESOPHAGOGASTRODUODENOSCOPY (EGD) WITH PROPOFOL N/A 09/02/2015   Procedure: ESOPHAGOGASTRODUODENOSCOPY (EGD) WITH PROPOFOL;  Surgeon: Gatha Mayer, MD;  Location: [facility];  Service: Endoscopy;  Laterality: N/A;   GASTRIC BY-PASS  2004   HERNIA REPAIR  2005   INSERTION OF VENA CAVA FILTER  01-17-13   inserted 2004- "abdomen"   LIGAMENT REPAIR Right 1987   Rt. knee scope   MAXIMUM ACCESS (MAS)POSTERIOR LUMBAR INTERBODY FUSION (PLIF) 2 LEVEL N/A 06/07/2014   Procedure: L4-5 L5-S1 FOR MAXIMUM ACCESS (MAS) POSTERIOR LUMBAR INTERBODY FUSION ;  Surgeon: Erline Levine, MD;  Location: [facility];  Service: Neurosurgery;  Laterality: N/A;  L4-5 L5-S1 FOR MAXIMUM ACCESS (MAS) POSTERIOR LUMBAR INTERBODY FUSION    TONSILLECTOMY     as child   TRANSTHORACIC ECHOCARDIOGRAM  03/2019   EF 60 to 65%.  Mild LVH.  No R WMA.  Normal RV size.  Normal LA, mild RA dilation.  No aortic valve stenosis or sclerosis.   Social History   Socioeconomic History   Marital status: Widowed    Spouse name: Not on file   Number of children: 0   Years of education: college   Highest education level: Master's degree (e.g., MA, MS, MEng, MEd, MSW, MBA)  Occupational History   Occupation: retired  Tobacco Use   Smoking status: Former    Packs/day: 0.50    Years: 10.00    Pack years: 5.00    Types: Cigarettes    Quit date: 04/19/2009    Years since quitting: 12.0   Smokeless tobacco: Never  Substance and Sexual Activity   Alcohol use: Yes    Alcohol/week: 0.0 standard drinks    Comment: occ. social- wine-1 drink monthly   Drug use: No   Sexual activity: Not Currently  Other Topics Concern   Not on file  Social History Narrative   Widow.  No kids.  Lives with her mother who is 31.  She does walk with a cane.  She is a retired Investment banker, corporate   She quit smoking in 2003   Social Determinants of Health   Financial Resource Strain: Medium Risk   Difficulty of Paying  Living Expenses: Somewhat hard  Food Insecurity: Landscape architect Present   Worried About Charity fundraiser in the Last Year: Sometimes true   Arboriculturist in the Last Year: Sometimes true  Transportation Needs: Public librarian (Medical): Yes   Lack of Transportation (Non-Medical): No  Physical Activity: Unknown   Days of Exercise per Week: 0 days   Minutes of Exercise per Session: Not on file  Stress: Stress Concern Present   Feeling of Stress : To some extent  Social Connections: Unknown   Frequency of Communication with  Friends and Family: More than three times a week   Frequency of Social Gatherings with Friends and Family: Once a week   Attends Religious Services: Patient refused   Active Member of Clubs or Organizations: Yes   Attends Archivist Meetings: 1 to 4 times per year   Marital Status: Widowed   Allergies  Allergen Reactions   Carafate [Sucralfate] Hives, Itching and Other (See Comments)    Angioedema      Sulfonamide Derivatives Rash    Syncope   Family History  Problem Relation Age of Onset   Diabetes Mother    Atrial fibrillation Mother    Hypertension Mother    Heart disease Father        Unsure of details   Hypertension Father    Prostate cancer Father    Alzheimer's disease Father    Kidney disease Brother    Healthy Sister    Diabetes Maternal Grandmother    Heart disease Maternal Grandmother    Stroke Maternal Grandfather 18   Hypertension Maternal Grandfather    Hypertension Paternal Grandmother    Alzheimer's disease Paternal Grandmother    Colon cancer Neg Hx    Colon polyps Neg Hx    Stomach cancer Neg Hx    Esophageal cancer Neg Hx    Pancreatic cancer Neg Hx    Liver disease Neg Hx     Current Outpatient Medications (Endocrine & Metabolic):    alendronate (FOSAMAX) 70 MG tablet, Take 1 tablet (70 mg total) by mouth every 7 (seven) days. Take with a full glass of water on an empty  stomach.   predniSONE (DELTASONE) 20 MG tablet, Take 2 tablets (40 mg total) by mouth daily with breakfast.  Current Outpatient Medications (Cardiovascular):    amLODipine (NORVASC) 10 MG tablet, TAKE 1 TABLET EVERY DAY  Current Outpatient Medications (Respiratory):    albuterol (VENTOLIN HFA) 108 (90 Base) MCG/ACT inhaler, INHALE 2 PUFFS INTO THE LUNGS EVERY 6 HOURS AS NEEDED FOR WHEEZING OR SHORTNESS OF BREATH   fluticasone-salmeterol (ADVAIR) 100-50 MCG/ACT AEPB, Inhale 1 puff into the lungs 2 (two) times daily.   promethazine-dextromethorphan (PROMETHAZINE-DM) 6.25-15 MG/5ML syrup, Take 5 mLs by mouth 4 (four) times daily as needed for cough.  Current Outpatient Medications (Analgesics):    acetaminophen (TYLENOL) 500 MG tablet, Take 500 mg by mouth every 6 (six) hours as needed.   traMADol (ULTRAM) 50 MG tablet, TAKE 1 TABLET(50 MG) BY MOUTH FOUR TIMES DAILY   Current Outpatient Medications (Other):    ACCU-CHEK SOFTCLIX LANCETS lancets, CHECK BLOOD SUGARS TWICE DAILY   Alcohol Swabs (B-D SINGLE USE SWABS REGULAR) PADS, USE AS DIRECTED EVERY DAY   Blood Glucose Calibration (ACCU-CHEK AVIVA) SOLN, Use as directed   Blood Glucose Monitoring Suppl (ACCU-CHEK AVIVA PLUS) w/Device KIT, USE AS DIRECTED  TO CHECK BLOOD SUGAR EVERY DAY   buPROPion (WELLBUTRIN XL) 300 MG 24 hr tablet, Take 1 tablet (300 mg total) by mouth daily.   Cholecalciferol (VITAMIN D) 125 MCG (5000 UT) CAPS, Take by mouth.   gabapentin (NEURONTIN) 100 MG capsule, Take 2 capsules (200 mg total) by mouth 2 (two) times daily.   glucose blood (ACCU-CHEK AVIVA PLUS) test strip, 1 each by Other route 2 (two) times daily. Use to check blood sugars twice a day   lipase/protease/amylase (CREON) 36000 UNITS CPEP capsule, Take 1 capsule by mouth in the morning, at noon, in the evening, and at bedtime.   ondansetron (ZOFRAN) 4 MG tablet, Take 1 tablet (  4 mg total) by mouth every 8 (eight) hours as needed for nausea or vomiting.    PARoxetine (PAXIL) 10 MG tablet, Take 10 mg by mouth daily.   Reviewed prior external information including notes and imaging from  primary care provider As well as notes that were available from care everywhere and other healthcare systems.  Past medical history, social, surgical and family history all reviewed in electronic medical record.  No pertanent information unless stated regarding to the chief complaint.   Review of Systems:  No headache, visual changes, nausea, vomiting, diarrhea, constipation, dizziness, abdominal pain, skin rash, fevers, chills, night sweats, weight loss, swollen lymph nodes, body aches, joint swelling, chest pain, shortness of breath, mood changes. POSITIVE muscle aches  Objective  Blood pressure 128/70, pulse 85, height $RemoveBe'5\' 6"'[id number]$  (1.676 m), weight 221 lb (100.2 kg), SpO2 98 %.   General: No apparent distress alert and oriented x3 mood and affect normal, dressed appropriately.  HEENT: Pupils equal, extraocular movements intact  Respiratory: Patient's speak in full sentences and does not appear short of breath  Cardiovascular: No lower extremity edema, non tender, no erythema  Gait mild antalgic. MSK: Patient's right knee does have tenderness to palpation more over the pedis anserine area.  Patient does have pain with full extension.  Patient has some mild instability of the knee but does not seem to make any of her pain worse.  Patient has no significant swelling of the knee at the moment.,     Impression and Recommendations:     The above documentation has been reviewed and is accurate and complete Lyndal Pulley, DO

## 2021-04-24 ENCOUNTER — Encounter: Payer: Self-pay | Admitting: Family Medicine

## 2021-04-24 ENCOUNTER — Other Ambulatory Visit: Payer: Self-pay

## 2021-04-24 ENCOUNTER — Ambulatory Visit: Payer: Medicare HMO | Admitting: Family Medicine

## 2021-04-24 DIAGNOSIS — M7051 Other bursitis of knee, right knee: Secondary | ICD-10-CM | POA: Diagnosis not present

## 2021-04-24 MED ORDER — GABAPENTIN 100 MG PO CAPS: 200.0000 mg | ORAL_CAPSULE | Freq: Two times a day (BID) | ORAL | 0 refills | Status: DC

## 2021-04-24 NOTE — Assessment & Plan Note (Signed)
I believe the patient is having more of a bursitis noted of the knee.  Discussed icing regimen and home exercises, discussed which activities to do which wants to avoid.  Increase activity slowly.  Patient work with Product/process development scientist.  Discussed thigh compression sleeve, heel lifts, icing regimen.  Patient will follow up again in 6 weeks and if still having pain consider the possibility of injection

## 2021-04-24 NOTE — Patient Instructions (Signed)
Do prescribed exercises at least 3x a week Thigh compression with walking Heel lift for shoes Voltaren Shorten stride length See you again in 6 weeks

## 2021-05-12 ENCOUNTER — Telehealth: Payer: Medicare HMO

## 2021-05-13 ENCOUNTER — Other Ambulatory Visit: Payer: Self-pay | Admitting: Internal Medicine

## 2021-05-13 DIAGNOSIS — M797 Fibromyalgia: Secondary | ICD-10-CM

## 2021-05-16 ENCOUNTER — Other Ambulatory Visit: Payer: Self-pay | Admitting: Internal Medicine

## 2021-05-25 ENCOUNTER — Telehealth: Payer: Medicare HMO

## 2021-05-27 ENCOUNTER — Telehealth: Payer: Medicare HMO

## 2021-06-11 NOTE — Progress Notes (Signed)
[place] Phone: [phone] Subjective:   [name], am serving as a scribe for Dr. Hulan Saas.  This visit occurred during the SARS-CoV-2 public health emergency.  Safety protocols were in place, including screening questions prior to the visit, additional usage of staff PPE, and extensive cleaning of exam room while observing appropriate contact time as indicated for disinfecting solutions.   I'm seeing this patient by the request  of:  Hoyt Koch, MD  CC:   CBS:[id number]  04/24/2021 I believe the patient is having more of a bursitis noted of the knee.  Discussed icing regimen and home exercises, discussed which activities to do which wants to avoid.  Increase activity slowly.  Patient work with Product/process development scientist.  Discussed thigh compression sleeve, heel lifts, icing regimen.  Patient will follow up again in 6 weeks and if still having pain consider the possibility of injection  Updated 06/12/2021 [name] is a 66 y.o. female coming in with complaint of LBP, R knee and R shoulder pain. Patient states that she is having radicular symptoms in R hip and leg over lateral aspect from knee to GT. Pain began one week ago. Her back is more painful as well for past week. Wants to know if pain is related to osteoporosis.   Not having as much pain in R shoulder and neck. Today she was washing dishes and she was moving cast iron skillet and this caused shoulder to catch. Feels that gabapentin is helping. Takes $RemoveBefore'100mg'yHAQZzVVPdTNU$  in morning and $RemoveBef'100mg'GgxMlqngme$  QHS. Heat also helping.        Past Medical History:  Diagnosis Date   Anemia    takes Ferrous Sulfate daily   Anxiety    takes Citalopram daily   Arthritis    Asthma    2004-prior to gastric bypass and [name] problems since   CHF (congestive heart failure) ([place])    takes Lasix daily as needed   Chronic back pain    spondylolisthesis/stenosis/radiculopathy    Complication of anesthesia yrs ago   slow to wake up   Depression    Diabetes ([place])    DVT (deep venous thrombosis) ([place]) 01/17/2013   past hx. -tx.5-6 yrs ago bilateral legs, occ. sporadic swelling, has IVC filter implanted   Dysrhythmia    "heart tends to flutter"   Fibromyalgia    Fracture    right foot and is in a cam boot   Gallstones    GERD (gastroesophageal reflux disease)    hx of-[name] meds now   Heart murmur    History of bronchitis 2012 or 2013   History of colon polyps    Hypertension    takes Metoprolol daily   Insomnia    takes Melatonin daily   Pelvic floor dysfunction    Peripheral neuropathy    Pneumonia 90's   hx of   S/P gastric bypass 2003   Sleep apnea    [name] cpap used in many yrs after weight lost-[name] machine now   Urinary frequency    Urinary urgency    Vitamin D deficiency    Past Surgical History:  Procedure Laterality Date   BALLOON DILATION N/A 01/31/2013   Procedure: BALLOON DILATION;  Surgeon: Arta Silence, MD;  Location: [facility];  Service: Endoscopy;  Laterality: N/A;   CARDIAC EVENT MONITOR  03/2019   Predominantly sinus rhythm.  Rates range from 48-124 bpm.  Average 74 bpm.  Frequent short  bursts (3-15 beats) PAT/PSVT--not indicated as being symptomatic on diary.  Otherwise rare PACs and PVCs.   CHOLECYSTECTOMY  2007   COLONOSCOPY     DILATION AND CURETTAGE OF UTERUS  yrs ago   ESOPHAGOGASTRODUODENOSCOPY (EGD) WITH PROPOFOL  03/01/2012   Procedure: ESOPHAGOGASTRODUODENOSCOPY (EGD) WITH PROPOFOL;  Surgeon: Arta Silence, MD;  Location: [facility];  Service: Endoscopy;  Laterality: N/A;   ESOPHAGOGASTRODUODENOSCOPY (EGD) WITH PROPOFOL N/A 01/31/2013   Procedure: ESOPHAGOGASTRODUODENOSCOPY (EGD) WITH PROPOFOL;  Surgeon: Arta Silence, MD;  Location: [facility];  Service: Endoscopy;  Laterality: N/A;   ESOPHAGOGASTRODUODENOSCOPY (EGD) WITH PROPOFOL N/A 09/02/2015   Procedure: ESOPHAGOGASTRODUODENOSCOPY (EGD) WITH PROPOFOL;  Surgeon:  Gatha Mayer, MD;  Location: [facility];  Service: Endoscopy;  Laterality: N/A;   GASTRIC BY-PASS  2004   HERNIA REPAIR  2005   INSERTION OF VENA CAVA FILTER  01-17-13   inserted 2004- "abdomen"   LIGAMENT REPAIR Right 1987   Rt. knee scope   MAXIMUM ACCESS (MAS)POSTERIOR LUMBAR INTERBODY FUSION (PLIF) 2 LEVEL N/A 06/07/2014   Procedure: L4-5 L5-S1 FOR MAXIMUM ACCESS (MAS) POSTERIOR LUMBAR INTERBODY FUSION ;  Surgeon: Erline Levine, MD;  Location: [facility];  Service: Neurosurgery;  Laterality: N/A;  L4-5 L5-S1 FOR MAXIMUM ACCESS (MAS) POSTERIOR LUMBAR INTERBODY FUSION    TONSILLECTOMY     as child   TRANSTHORACIC ECHOCARDIOGRAM  03/2019   EF 60 to 65%.  Mild LVH.  [name] R WMA.  Normal RV size.  Normal LA, mild RA dilation.  [name] aortic valve stenosis or sclerosis.   Social History   Socioeconomic History   Marital status: Widowed    Spouse name: Not on file   Number of children: 0   Years of education: college   Highest education level: Master's degree (e.g., MA, MS, MEng, MEd, MSW, MBA)  Occupational History   Occupation: retired  Tobacco Use   Smoking status: Former    Packs/day: 0.50    Years: 10.00    Pack years: 5.00    Types: Cigarettes    Quit date: 04/19/2009    Years since quitting: 12.1   Smokeless tobacco: Never  Substance and Sexual Activity   Alcohol use: Yes    Alcohol/week: 0.0 standard drinks    Comment: occ. social- wine-1 drink monthly   Drug use: [name]   Sexual activity: Not Currently  Other Topics Concern   Not on file  Social History Narrative   Widow.  [name] kids.  Lives with her mother who is 76.  She does walk with a cane.  She is a retired Investment banker, corporate   She quit smoking in 2003   Social Determinants of Health   Financial Resource Strain: Medium Risk   Difficulty of Paying Living Expenses: Somewhat hard  Food Insecurity: Landscape architect Present   Worried About Charity fundraiser in the Last Year: Sometimes true   Arboriculturist in the Last  Year: Sometimes true  Transportation Needs: Public librarian (Medical): Yes   Lack of Transportation (Non-Medical): [name]  Physical Activity: Unknown   Days of Exercise per Week: 0 days   Minutes of Exercise per Session: Not on file  Stress: Stress Concern Present   Feeling of Stress : To some extent  Social Connections: Unknown   Frequency of Communication with Friends and Family: More than three times a week   Frequency of Social Gatherings with Friends and Family: Once a week   Attends Religious Services:  Patient refused   Active Member of Clubs or Organizations: Yes   Attends Archivist Meetings: 1 to 4 times per year   Marital Status: Widowed   Allergies  Allergen Reactions   Carafate [Sucralfate] Hives, Itching and Other (See Comments)    Angioedema      Sulfonamide Derivatives Rash    Syncope   Family History  Problem Relation Age of Onset   Diabetes Mother    Atrial fibrillation Mother    Hypertension Mother    Heart disease Father        Unsure of details   Hypertension Father    Prostate cancer Father    Alzheimer's disease Father    Kidney disease Brother    Healthy Sister    Diabetes Maternal Grandmother    Heart disease Maternal Grandmother    Stroke Maternal Grandfather 44   Hypertension Maternal Grandfather    Hypertension Paternal Grandmother    Alzheimer's disease Paternal Grandmother    Colon cancer Neg Hx    Colon polyps Neg Hx    Stomach cancer Neg Hx    Esophageal cancer Neg Hx    Pancreatic cancer Neg Hx    Liver disease Neg Hx     Current Outpatient Medications (Endocrine & Metabolic):    alendronate (FOSAMAX) 70 MG tablet, Take 1 tablet (70 mg total) by mouth every 7 (seven) days. Take with a full glass of water on an empty stomach.   predniSONE (DELTASONE) 20 MG tablet, Take 2 tablets (40 mg total) by mouth daily with breakfast.  Current Outpatient Medications (Cardiovascular):    amLODipine  (NORVASC) 10 MG tablet, TAKE 1 TABLET EVERY DAY  Current Outpatient Medications (Respiratory):    albuterol (VENTOLIN HFA) 108 (90 Base) MCG/ACT inhaler, INHALE 2 PUFFS INTO THE LUNGS EVERY 6 HOURS AS NEEDED FOR WHEEZING OR SHORTNESS OF BREATH   fluticasone-salmeterol (ADVAIR) 100-50 MCG/ACT AEPB, Inhale 1 puff into the lungs 2 (two) times daily.   promethazine-dextromethorphan (PROMETHAZINE-DM) 6.25-15 MG/5ML syrup, Take 5 mLs by mouth 4 (four) times daily as needed for cough.  Current Outpatient Medications (Analgesics):    acetaminophen (TYLENOL) 500 MG tablet, Take 500 mg by mouth every 6 (six) hours as needed.   traMADol (ULTRAM) 50 MG tablet, Take 1 tablet (50 mg total) by mouth every 6 (six) hours as needed. TAKE 1 TABLET(50 MG) BY MOUTH FOUR TIMES DAILY   Current Outpatient Medications (Other):    ACCU-CHEK SOFTCLIX LANCETS lancets, CHECK BLOOD SUGARS TWICE DAILY   Alcohol Swabs (B-D SINGLE USE SWABS REGULAR) PADS, USE AS DIRECTED EVERY DAY   Blood Glucose Calibration (ACCU-CHEK AVIVA) SOLN, Use as directed   Blood Glucose Monitoring Suppl (ACCU-CHEK AVIVA PLUS) w/Device KIT, USE AS DIRECTED  TO CHECK BLOOD SUGAR EVERY DAY   buPROPion (WELLBUTRIN XL) 300 MG 24 hr tablet, Take 1 tablet (300 mg total) by mouth daily.   gabapentin (NEURONTIN) 100 MG capsule, Take 2 capsules (200 mg total) by mouth 2 (two) times daily.   glucose blood (ACCU-CHEK AVIVA PLUS) test strip, 1 each by Other route 2 (two) times daily. Use to check blood sugars twice a day   lipase/protease/amylase (CREON) 36000 UNITS CPEP capsule, Take 1 capsule by mouth in the morning, at noon, in the evening, and at bedtime.   ondansetron (ZOFRAN) 4 MG tablet, Take 1 tablet (4 mg total) by mouth every 8 (eight) hours as needed for nausea or vomiting.   PARoxetine (PAXIL) 10 MG tablet, TAKE 1 TABLET(10 MG)  BY MOUTH DAILY   Cholecalciferol (VITAMIN D) 125 MCG (5000 UT) CAPS, Take by mouth.   Reviewed prior external information  including notes and imaging from  primary care provider As well as notes that were available from care everywhere and other healthcare systems.  Past medical history, social, surgical and family history all reviewed in electronic medical record.  [name] pertanent information unless stated regarding to the chief complaint.   Review of Systems:  [name] headache, visual changes, nausea, vomiting, diarrhea, constipation, dizziness, abdominal pain, skin rash, fevers, chills, night sweats, weight loss, swollen lymph nodes, body aches, joint swelling, chest pain, shortness of breath, mood changes. POSITIVE muscle aches  Objective  Blood pressure 138/88, pulse 73, height $RemoveBe'5\' 6"'FlOaPYIJI$  (1.676 m), weight 230 lb (104.3 kg), SpO2 98 %.   General: [name] apparent distress alert and oriented x3 mood and affect normal, dressed appropriately.  HEENT: Pupils equal, extraocular movements intact  Respiratory: Patient's speak in full sentences and does not appear short of breath  Cardiovascular: [name] lower extremity edema, non tender, [name] erythema  Right shoulder exam shows the patient does have positive crossover.  Tender to palpation of the paraspinal musculature of the parascapular region as well.  Rotator cuff does appear to be intact. Severely antalgic gait noted.  Severe tenderness over the greater trochanteric area on the right side as well.   Procedure: Real-time Ultrasound Guided Injection of right greater trochanteric bursitis secondary to patient's body habitus Device: GE Logiq Q7 Ultrasound guided injection is preferred based studies that show increased duration, increased effect, greater accuracy, decreased procedural pain, increased response rate, and decreased cost with ultrasound guided versus blind injection.  Verbal informed consent obtained.  Time-out conducted.  Noted [name] overlying erythema, induration, or other signs of local infection.  Skin prepped in a sterile fashion.  Local anesthesia: Topical Ethyl  chloride.  With sterile technique and under real time ultrasound guidance:  Greater trochanteric area was visualized and patient's bursa was noted. A 22-gauge 3 inch needle was inserted and 4 cc of 0.5% Marcaine and 1 cc of Kenalog 40 mg/dL was injected. Pictures taken Completed without difficulty  Pain immediately resolved suggesting accurate placement of the medication.  Advised to call if fevers/chills, erythema, induration, drainage, or persistent bleeding.   Impression: Technically successful ultrasound guided injection.  Procedure: Real-time Ultrasound Guided Injection of right acromioclavicular joint Device: GE Logiq Q7 Ultrasound guided injection is preferred based studies that show increased duration, increased effect, greater accuracy, decreased procedural pain, increased response rate, and decreased cost with ultrasound guided versus blind injection.  Verbal informed consent obtained.  Time-out conducted.  Noted [name] overlying erythema, induration, or other signs of local infection.  Skin prepped in a sterile fashion.  Local anesthesia: Topical Ethyl chloride.  With sterile technique and under real time ultrasound guidance: With a 25-gauge half inch needle injected into the right acromioclavicular joint with a total of 0.5 cc of 0.5% Marcaine and 0.5 cc of Kenalog 40 mg per Completed without difficulty  Pain immediately resolved suggesting accurate placement of the medication.  Advised to call if fevers/chills, erythema, induration, drainage, or persistent bleeding.  Impression: Technically successful ultrasound guided injection.     Impression and Recommendations:     The above documentation has been reviewed and is accurate and complete Lyndal Pulley, DO

## 2021-06-12 ENCOUNTER — Other Ambulatory Visit: Payer: Self-pay

## 2021-06-12 ENCOUNTER — Encounter: Payer: Self-pay | Admitting: Family Medicine

## 2021-06-12 ENCOUNTER — Ambulatory Visit (INDEPENDENT_AMBULATORY_CARE_PROVIDER_SITE_OTHER): Payer: Medicare HMO | Admitting: Family Medicine

## 2021-06-12 ENCOUNTER — Ambulatory Visit: Payer: Self-pay

## 2021-06-12 VITALS — BP 138/88 | HR 73 | Ht 66.0 in | Wt 230.0 lb

## 2021-06-12 DIAGNOSIS — M7061 Trochanteric bursitis, right hip: Secondary | ICD-10-CM | POA: Diagnosis not present

## 2021-06-12 DIAGNOSIS — M5416 Radiculopathy, lumbar region: Secondary | ICD-10-CM | POA: Diagnosis not present

## 2021-06-12 DIAGNOSIS — M19019 Primary osteoarthritis, unspecified shoulder: Secondary | ICD-10-CM | POA: Insufficient documentation

## 2021-06-12 DIAGNOSIS — M25511 Pain in right shoulder: Secondary | ICD-10-CM

## 2021-06-12 DIAGNOSIS — M19011 Primary osteoarthritis, right shoulder: Secondary | ICD-10-CM | POA: Diagnosis not present

## 2021-06-12 NOTE — Patient Instructions (Addendum)
Injected AC joint and GT in hip today See me in 5-6 weeks

## 2021-06-12 NOTE — Assessment & Plan Note (Signed)
Patient given injection and tolerated the procedure well.  Discussed icing regimen and home exercises, discussed which activities to do and which ones to avoid.  Increase activity slowly.  Follow-up again in 6 to 8 weeks

## 2021-06-12 NOTE — Assessment & Plan Note (Signed)
Patient given injection today and tolerated the procedure well, discussed icing regimen and home exercises.  Differential includes lumbar radiculopathy and we will monitor.  Discussed with patient about the potential for increasing gabapentin.  Patient will consider it.  She is have enough for 200 mg twice a day and is only taking 200 mg at night.  Patient will increase activity slowly and follow-up with me again in 6 weeks

## 2021-06-22 ENCOUNTER — Encounter: Payer: Self-pay | Admitting: Internal Medicine

## 2021-06-22 DIAGNOSIS — R350 Frequency of micturition: Secondary | ICD-10-CM

## 2021-06-23 ENCOUNTER — Other Ambulatory Visit (INDEPENDENT_AMBULATORY_CARE_PROVIDER_SITE_OTHER): Payer: Medicare HMO

## 2021-06-23 ENCOUNTER — Other Ambulatory Visit: Payer: Self-pay

## 2021-06-23 DIAGNOSIS — R350 Frequency of micturition: Secondary | ICD-10-CM

## 2021-06-24 ENCOUNTER — Other Ambulatory Visit: Payer: Medicare HMO

## 2021-06-25 ENCOUNTER — Other Ambulatory Visit: Payer: Self-pay | Admitting: Internal Medicine

## 2021-06-25 LAB — URINALYSIS, ROUTINE W REFLEX MICROSCOPIC
Bilirubin Urine: NEGATIVE
Hgb urine dipstick: NEGATIVE
Ketones, ur: NEGATIVE
Nitrite: POSITIVE — AB
RBC / HPF: NONE SEEN (ref 0–?)
Specific Gravity, Urine: 1.02 (ref 1.000–1.030)
Total Protein, Urine: NEGATIVE
Urine Glucose: NEGATIVE
Urobilinogen, UA: 0.2 (ref 0.0–1.0)
pH: 6 (ref 5.0–8.0)

## 2021-06-25 MED ORDER — NITROFURANTOIN MONOHYD MACRO 100 MG PO CAPS: 100.0000 mg | ORAL_CAPSULE | Freq: Two times a day (BID) | ORAL | 0 refills | Status: DC

## 2021-06-27 LAB — URINE CULTURE

## 2021-07-07 NOTE — Addendum Note (Signed)
Addended by: Pricilla Holm A on: 07/07/2021 07:25 AM ? ? Modules accepted: Orders ? ?

## 2021-07-09 ENCOUNTER — Other Ambulatory Visit: Payer: Self-pay

## 2021-07-09 ENCOUNTER — Ambulatory Visit (INDEPENDENT_AMBULATORY_CARE_PROVIDER_SITE_OTHER): Payer: Medicare HMO | Admitting: Emergency Medicine

## 2021-07-09 ENCOUNTER — Encounter: Payer: Self-pay | Admitting: Emergency Medicine

## 2021-07-09 ENCOUNTER — Telehealth: Payer: Medicare HMO | Admitting: Family Medicine

## 2021-07-09 VITALS — BP 138/86 | HR 87 | Ht 66.0 in | Wt 221.0 lb

## 2021-07-09 DIAGNOSIS — I509 Heart failure, unspecified: Secondary | ICD-10-CM | POA: Diagnosis not present

## 2021-07-09 DIAGNOSIS — J069 Acute upper respiratory infection, unspecified: Secondary | ICD-10-CM | POA: Diagnosis not present

## 2021-07-09 DIAGNOSIS — H9201 Otalgia, right ear: Secondary | ICD-10-CM

## 2021-07-09 DIAGNOSIS — R0602 Shortness of breath: Secondary | ICD-10-CM

## 2021-07-09 DIAGNOSIS — H10023 Other mucopurulent conjunctivitis, bilateral: Secondary | ICD-10-CM | POA: Diagnosis not present

## 2021-07-09 DIAGNOSIS — H6691 Otitis media, unspecified, right ear: Secondary | ICD-10-CM | POA: Diagnosis not present

## 2021-07-09 DIAGNOSIS — E119 Type 2 diabetes mellitus without complications: Secondary | ICD-10-CM | POA: Diagnosis not present

## 2021-07-09 MED ORDER — TOBRAMYCIN 0.3 % OP SOLN: 2.0000 [drp] | Freq: Four times a day (QID) | OPHTHALMIC | 0 refills | Status: DC

## 2021-07-09 MED ORDER — AMOXICILLIN-POT CLAVULANATE 875-125 MG PO TABS: 1.0000 | ORAL_TABLET | Freq: Two times a day (BID) | ORAL | 0 refills | Status: DC

## 2021-07-09 MED ORDER — ACETAMINOPHEN-CODEINE 300-30 MG PO TABS: 1.0000 | ORAL_TABLET | Freq: Four times a day (QID) | ORAL | 0 refills | Status: DC | PRN

## 2021-07-09 NOTE — Patient Instructions (Signed)
Otitis Media, Adult ?Otitis media is a condition in which the middle ear is red and swollen (inflamed) and full of fluid. The middle ear is the part of the ear that contains bones for hearing as well as air that helps send sounds to the brain. The condition usually goes away on its own. ?What are the causes? ?This condition is caused by a blockage in the eustachian tube. This tube connects the middle ear to the back of the nose. It normally allows air into the middle ear. The blockage is caused by fluid or swelling. Problems that can cause blockage include: ?A cold or infection that affects the nose, mouth, or throat. ?Allergies. ?An irritant, such as tobacco smoke. ?Adenoids that have become large. The adenoids are soft tissue located in the back of the throat, behind the nose and the roof of the mouth. ?Growth or swelling in the upper part of the throat, just behind the nose (nasopharynx). ?Damage to the ear caused by a change in pressure. This is called barotrauma. ?What increases the risk? ?You are more likely to develop this condition if you: ?Smoke or are exposed to tobacco smoke. ?Have an opening in the roof of your mouth (cleft palate). ?Have acid reflux. ?Have problems in your body's defense system (immune system). ?What are the signs or symptoms? ?Symptoms of this condition include: ?Ear pain. ?Fever. ?Problems with hearing. ?Being tired. ?Fluid leaking from the ear. ?Ringing in the ear. ?How is this treated? ?This condition can go away on its own within 3-5 days. But if the condition is caused by germs (bacteria) and does not go away on its own, or if it keeps coming back, your doctor may: ?Give you antibiotic medicines. ?Give you medicines for pain. ?Follow these instructions at home: ?Take over-the-counter and prescription medicines only as told by your doctor. ?If you were prescribed an antibiotic medicine, take it as told by your doctor. Do not stop taking it even if you start to feel better. ?Keep  all follow-up visits. ?Contact a doctor if: ?You have bleeding from your nose. ?There is a lump on your neck. ?You are not feeling better in 5 days. ?You feel worse instead of better. ?Get help right away if: ?You have pain that is not helped with medicine. ?You have swelling, redness, or pain around your ear. ?You get a stiff neck. ?You cannot move part of your face (paralysis). ?You notice that the bone behind your ear hurts when you touch it. ?You get a very bad headache. ?Summary ?Otitis media means that the middle ear is red, swollen, and full of fluid. ?This condition usually goes away on its own. ?If the problem does not go away, treatment may be needed. You may be given medicines to treat the infection or to treat your pain. ?If you were prescribed an antibiotic medicine, take it as told by your doctor. Do not stop taking it even if you start to feel better. ?Keep all follow-up visits. ?This information is not intended to replace advice given to you by your health care provider. Make sure you discuss any questions you have with your health care provider. ?Document Revised: 07/14/2020 Document Reviewed: 07/14/2020 ?Elsevier Patient Education ? [place]. ? ?

## 2021-07-09 NOTE — Progress Notes (Signed)
[place] ? ?Shortness of breath, using inhaler and still reporting wheezing with this as well. ? ?In person recommended. ?

## 2021-07-09 NOTE — Progress Notes (Signed)
[name] ?66 y.o. ? ? ?Chief Complaint  ?Patient presents with  ? Cough  ?  Congestion, headache, x monday  ? ? ?HISTORY OF PRESENT ILLNESS: ?Acute problem visit today. ?This is a 66 y.o. female complaining of flulike symptoms since last Monday.  Mostly complaining of productive cough, chest congestion, headache, and right ear pain. ?Had fever and some chills.  Decreased appetite.  Denies nausea or vomiting.  Denies abdominal pain or diarrhea. ?Denies difficulty breathing or wheezing.  Tested negative for COVID at home. ?Diabetic. ?No other complaints or medical concerns today. ? ?Cough ?Associated symptoms include ear pain and a fever. Pertinent negatives include no chest pain, headaches or rash.  ? ? ?Prior to Admission medications   ?Medication Sig Start Date End Date Taking? Authorizing Provider  ?ACCU-CHEK SOFTCLIX LANCETS lancets CHECK BLOOD SUGARS TWICE DAILY 01/26/17  Yes Hoyt Koch, MD  ?acetaminophen (TYLENOL) 500 MG tablet Take 500 mg by mouth every 6 (six) hours as needed.   Yes [provider]  ?albuterol (VENTOLIN HFA) 108 (90 Base) MCG/ACT inhaler INHALE 2 PUFFS INTO THE LUNGS EVERY 6 HOURS AS NEEDED FOR WHEEZING OR SHORTNESS OF BREATH 02/07/20  Yes Hoyt Koch, MD  ?Alcohol Swabs (B-D SINGLE USE SWABS REGULAR) PADS USE AS DIRECTED EVERY DAY 04/15/20  Yes Hoyt Koch, MD  ?alendronate (FOSAMAX) 70 MG tablet Take 1 tablet (70 mg total) by mouth every 7 (seven) days. Take with a full glass of water on an empty stomach. 04/03/21  Yes Hoyt Koch, MD  ?amLODipine (NORVASC) 10 MG tablet TAKE 1 TABLET EVERY DAY 02/11/21  Yes Hoyt Koch, MD  ?Blood Glucose Calibration (ACCU-CHEK AVIVA) SOLN Use as directed 10/29/15  Yes Hoyt Koch, MD  ?Blood Glucose Monitoring Suppl (ACCU-CHEK AVIVA PLUS) w/Device KIT USE AS DIRECTED  TO CHECK BLOOD SUGAR EVERY DAY 11/05/19  Yes Hoyt Koch, MD  ?buPROPion (WELLBUTRIN XL) 300 MG 24 hr  tablet Take 1 tablet (300 mg total) by mouth daily. 02/02/21  Yes Hoyt Koch, MD  ?fluticasone-salmeterol (ADVAIR) 100-50 MCG/ACT AEPB Inhale 1 puff into the lungs 2 (two) times daily. 12/02/20  Yes Hoyt Koch, MD  ?gabapentin (NEURONTIN) 100 MG capsule Take 2 capsules (200 mg total) by mouth 2 (two) times daily. 04/24/21  Yes Lyndal Pulley, DO  ?glucose blood (ACCU-CHEK AVIVA PLUS) test strip 1 each by Other route 2 (two) times daily. Use to check blood sugars twice a day 11/15/19  Yes Hoyt Koch, MD  ?lipase/protease/amylase (CREON) 36000 UNITS CPEP capsule Take 1 capsule by mouth in the morning, at noon, in the evening, and at bedtime. 04/28/20  Yes [provider]  ?nitrofurantoin, macrocrystal-monohydrate, (MACROBID) 100 MG capsule Take 1 capsule (100 mg total) by mouth 2 (two) times daily. 06/25/21  Yes Hoyt Koch, MD  ?ondansetron (ZOFRAN) 4 MG tablet Take 1 tablet (4 mg total) by mouth every 8 (eight) hours as needed for nausea or vomiting. 03/17/21  Yes Hoyt Koch, MD  ?PARoxetine (PAXIL) 10 MG tablet TAKE 1 TABLET(10 MG) BY MOUTH DAILY 05/19/21  Yes Hoyt Koch, MD  ?traMADol (ULTRAM) 50 MG tablet Take 1 tablet (50 mg total) by mouth every 6 (six) hours as needed. TAKE 1 TABLET(50 MG) BY MOUTH FOUR TIMES DAILY 05/14/21  Yes Hoyt Koch, MD  ?Cholecalciferol (VITAMIN D) 125 MCG (5000 UT) CAPS Take by mouth.    [provider]  ?predniSONE (DELTASONE) 20 MG tablet  Take 2 tablets (40 mg total) by mouth daily with breakfast. 03/30/21   Hoyt Koch, MD  ?promethazine-dextromethorphan (PROMETHAZINE-DM) 6.25-15 MG/5ML syrup Take 5 mLs by mouth 4 (four) times daily as needed for cough. 03/17/21   Hoyt Koch, MD  ? ? ?Allergies  ?Allergen Reactions  ? Carafate [Sucralfate] Hives, Itching and Other (See Comments)  ?  Angioedema  ? ?  ? Sulfonamide Derivatives Rash  ?  Syncope  ? ? ?Patient Active Problem List   ? Diagnosis Date Noted  ? Greater trochanteric bursitis, right 06/12/2021  ? Acromioclavicular arthrosis 06/12/2021  ? Pes anserinus bursitis of right knee 04/24/2021  ? Left hip pain 03/06/2021  ? Vitamin D deficiency 02/04/2021  ? Chest pain on breathing 08/11/2020  ? Closed fracture of left distal radius 05/01/2020  ? Recurrent Clostridioides difficile infection 02/28/2020  ? Pyelonephritis 02/28/2020  ? Abnormal urine odor 08/10/2019  ? Gastrointestinal complaint 07/05/2019  ? Muscle strain of gluteal region, right, initial encounter 05/11/2019  ? DOE (dyspnea on exertion) 03/13/2019  ? Rapid heartbeat 03/13/2019  ? Systolic ejection murmur 40/37/0964  ? Leg swelling 10/09/2018  ? Insomnia 05/19/2018  ? Frequent refractory urinary tract infections 05/05/2018  ? Right leg pain 12/16/2017  ? History of deep vein thrombosis (DVT) of lower extremity 12/12/2017  ? Cavovarus deformity of foot, acquired, unspecified laterality 04/07/2017  ? S/P gastric bypass 08/18/2016  ? Muscle cramps 07/09/2016  ? Adjustment disorder with mixed anxiety and depressed mood 03/05/2016  ? Fatigue 03/05/2016  ? Cervical disc disorder with radiculopathy of cervical region 02/27/2016  ? Degenerative arthritis of right knee 02/27/2016  ? Right knee pain 02/24/2016  ? Left shoulder pain 02/24/2016  ? Routine general medical examination at a health care facility 12/12/2015  ? Schatzki's ring   ? Lumbar radiculopathy 03/05/2015  ? Peripheral neuropathy 01/17/2015  ? Chest pain with moderate risk for cardiac etiology 05/21/2013  ? Fibromyalgia 02/21/2011  ? Viral URI 10/12/2007  ? Essential hypertension 12/04/2006  ? Hx of diabetes mellitus 10/20/2006  ? Morbid obesity ([place]) 10/20/2006  ? MDD (major depressive disorder) ([place]) 10/20/2006  ? OBSTRUCTIVE SLEEP APNEA 10/20/2006  ? OSTEOARTHRITIS 10/20/2006  ? ? ?Past Medical History:  ?Diagnosis Date  ? Anemia   ? takes Ferrous Sulfate daily  ? Anxiety   ? takes Citalopram daily  ? Arthritis   ?  Asthma   ? 2004-prior to gastric bypass and no problems since  ? CHF (congestive heart failure) ([place])   ? takes Lasix daily as needed  ? Chronic back pain   ? spondylolisthesis/stenosis/radiculopathy  ? Complication of anesthesia yrs ago  ? slow to wake up  ? Depression   ? Diabetes ([place])   ? DVT (deep venous thrombosis) ([place]) 01/17/2013  ? past hx. -tx.5-6 yrs ago bilateral legs, occ. sporadic swelling, has IVC filter implanted  ? Dysrhythmia   ? "heart tends to flutter"  ? Fibromyalgia   ? Fracture   ? right foot and is in a cam boot  ? Gallstones   ? GERD (gastroesophageal reflux disease)   ? hx of-no meds now  ? Heart murmur   ? History of bronchitis 2012 or 2013  ? History of colon polyps   ? Hypertension   ? takes Metoprolol daily  ? Insomnia   ? takes Melatonin daily  ? Pelvic floor dysfunction   ? Peripheral neuropathy   ? Pneumonia 90's  ? hx of  ? S/P gastric bypass 2003  ?  Sleep apnea   ? no cpap used in many yrs after weight lost-no machine now  ? Urinary frequency   ? Urinary urgency   ? Vitamin D deficiency   ? ? ?Past Surgical History:  ?Procedure Laterality Date  ? BALLOON DILATION N/A 01/31/2013  ? Procedure: BALLOON DILATION;  Surgeon: Arta Silence, MD;  Location: [facility];  Service: Endoscopy;  Laterality: N/A;  ? CARDIAC EVENT MONITOR  03/2019  ? Predominantly sinus rhythm.  Rates range from 48-124 bpm.  Average 74 bpm.  Frequent short bursts (3-15 beats) PAT/PSVT--not indicated as being symptomatic on diary.  Otherwise rare PACs and PVCs.  ? CHOLECYSTECTOMY  2007  ? COLONOSCOPY    ? DILATION AND CURETTAGE OF UTERUS  yrs ago  ? ESOPHAGOGASTRODUODENOSCOPY (EGD) WITH PROPOFOL  03/01/2012  ? Procedure: ESOPHAGOGASTRODUODENOSCOPY (EGD) WITH PROPOFOL;  Surgeon: Arta Silence, MD;  Location: [facility];  Service: Endoscopy;  Laterality: N/A;  ? ESOPHAGOGASTRODUODENOSCOPY (EGD) WITH PROPOFOL N/A 01/31/2013  ? Procedure: ESOPHAGOGASTRODUODENOSCOPY (EGD) WITH PROPOFOL;  Surgeon: Arta Silence,  MD;  Location: [facility];  Service: Endoscopy;  Laterality: N/A;  ? ESOPHAGOGASTRODUODENOSCOPY (EGD) WITH PROPOFOL N/A 09/02/2015  ? Procedure: ESOPHAGOGASTRODUODENOSCOPY (EGD) WITH PROPOFOL;  Renford Dills

## 2021-07-15 ENCOUNTER — Encounter: Payer: Self-pay | Admitting: Internal Medicine

## 2021-07-15 NOTE — Telephone Encounter (Signed)
Should be reevaluated in the office or at urgent care center.  Thanks.

## 2021-07-16 ENCOUNTER — Telehealth (INDEPENDENT_AMBULATORY_CARE_PROVIDER_SITE_OTHER): Payer: Medicare HMO | Admitting: Nurse Practitioner

## 2021-07-16 DIAGNOSIS — J069 Acute upper respiratory infection, unspecified: Secondary | ICD-10-CM

## 2021-07-16 NOTE — Progress Notes (Signed)
? ?An audio-only tele-health visit was completed today for this patient. I connected with  [name] on 07/16/21 utilizing audio-only technology and verified that I am speaking with the correct person using two identifiers. The patient was located at their home, and I was located at the office of [place] at [facility] during the encounter. I discussed the limitations of evaluation and management by telemedicine. The patient expressed understanding and agreed to proceed.  ? ? ? ?Subjective:  ?Patient ID: [name], female    DOB: [birth year]  Age: 66 y.o. MRN: [id number] ? ?CC:  ?Chief Complaint  ?Patient presents with  ? Cough  ?  ? ? ?HPI  ?This patient arrives today for virtual visit for the above. ? ?Symptoms initially started about 17 days ago, she was seen in the office about 1 week ago and diagnosed with otitis media and viral respiratory infection.  She was treated with course of Augmentin.  She calls today because she feels her symptoms are not improved and slightly worse.  She continues to have fever and chills.  Last time she checked her temperature was this morning it was 99.6 and this is after Tylenol administration.  She has tested herself for COVID-19 infection at home twice both of which were negative.  She lives alone and reports significant lethargy where she is having a hard time staying awake.  Production of sputum has decreased over the last week.  She does have shortness of breath with physical exertion and has been sleeping on an. incline Due to the nasal congestion.  She also reports frequent loose stools.  She also reports that she continues to have ear pain. ? ?Past Medical History:  ?Diagnosis Date  ? Anemia   ? takes Ferrous Sulfate daily  ? Anxiety   ? takes Citalopram daily  ? Arthritis   ? Asthma   ? 2004-prior to gastric bypass and no problems since  ? CHF (congestive heart failure) ([place])   ? takes Lasix daily as needed  ? Chronic back pain   ?  spondylolisthesis/stenosis/radiculopathy  ? Complication of anesthesia yrs ago  ? slow to wake up  ? Depression   ? Diabetes ([place])   ? DVT (deep venous thrombosis) ([place]) 01/17/2013  ? past hx. -tx.5-6 yrs ago bilateral legs, occ. sporadic swelling, has IVC filter implanted  ? Dysrhythmia   ? "heart tends to flutter"  ? Fibromyalgia   ? Fracture   ? right foot and is in a cam boot  ? Gallstones   ? GERD (gastroesophageal reflux disease)   ? hx of-no meds now  ? Heart murmur   ? History of bronchitis 2012 or 2013  ? History of colon polyps   ? Hypertension   ? takes Metoprolol daily  ? Insomnia   ? takes Melatonin daily  ? Pelvic floor dysfunction   ? Peripheral neuropathy   ? Pneumonia 90's  ? hx of  ? S/P gastric bypass 2003  ? Sleep apnea   ? no cpap used in many yrs after weight lost-no machine now  ? Urinary frequency   ? Urinary urgency   ? Vitamin D deficiency   ? ? ? ? ?Family History  ?Problem Relation Age of Onset  ? Diabetes Mother   ? Atrial fibrillation Mother   ? Hypertension Mother   ? Heart disease Father   ?     Unsure of details  ? Hypertension Father   ? Prostate cancer  Father   ? Alzheimer's disease Father   ? Kidney disease Brother   ? Healthy Sister   ? Diabetes Maternal Grandmother   ? Heart disease Maternal Grandmother   ? Stroke Maternal Grandfather 50  ? Hypertension Maternal Grandfather   ? Hypertension Paternal Grandmother   ? Alzheimer's disease Paternal Grandmother   ? Colon cancer Neg Hx   ? Colon polyps Neg Hx   ? Stomach cancer Neg Hx   ? Esophageal cancer Neg Hx   ? Pancreatic cancer Neg Hx   ? Liver disease Neg Hx   ? ? ?Social History  ? ?Social History Narrative  ? Widow.  No kids.  Lives with her mother who is 76.  She does walk with a cane.  She is a retired Investment banker, corporate  ? She quit smoking in 2003  ? ?Social History  ? ?Tobacco Use  ? Smoking status: Former  ?  Packs/day: 0.50  ?  Years: 10.00  ?  Pack years: 5.00  ?  Types: Cigarettes  ?  Quit date: 04/19/2009  ?  Years  since quitting: 12.2  ? Smokeless tobacco: Never  ?Substance Use Topics  ? Alcohol use: Yes  ?  Alcohol/week: 0.0 standard drinks  ?  Comment: occ. social- wine-1 drink monthly  ? ? ? ?Current Meds  ?Medication Sig  ? Acetaminophen-Codeine 300-30 MG tablet Take 1 tablet by mouth every 6 (six) hours as needed for pain.  ? albuterol (VENTOLIN HFA) 108 (90 Base) MCG/ACT inhaler INHALE 2 PUFFS INTO THE LUNGS EVERY 6 HOURS AS NEEDED FOR WHEEZING OR SHORTNESS OF BREATH  ? alendronate (FOSAMAX) 70 MG tablet Take 1 tablet (70 mg total) by mouth every 7 (seven) days. Take with a full glass of water on an empty stomach.  ? amLODipine (NORVASC) 10 MG tablet TAKE 1 TABLET EVERY DAY  ? buPROPion (WELLBUTRIN XL) 300 MG 24 hr tablet Take 1 tablet (300 mg total) by mouth daily.  ? fluticasone-salmeterol (ADVAIR) 100-50 MCG/ACT AEPB Inhale 1 puff into the lungs 2 (two) times daily.  ? gabapentin (NEURONTIN) 100 MG capsule Take 2 capsules (200 mg total) by mouth 2 (two) times daily.  ? lipase/protease/amylase (CREON) 36000 UNITS CPEP capsule Take 1 capsule by mouth in the morning, at noon, in the evening, and at bedtime.  ? ondansetron (ZOFRAN) 4 MG tablet Take 1 tablet (4 mg total) by mouth every 8 (eight) hours as needed for nausea or vomiting.  ? PARoxetine (PAXIL) 10 MG tablet TAKE 1 TABLET(10 MG) BY MOUTH DAILY  ? ? ?ROS:  ?Review of Systems  ?Constitutional:  Positive for diaphoresis, fever (101 is highest; today 99.6 with some chills) and malaise/fatigue (lethargic).  ?HENT:  Positive for congestion.   ?Respiratory:  Positive for cough and shortness of breath (sleeps on incline; with activity). Negative for sputum production (this is improved).   ?Cardiovascular:  Negative for chest pain.  ?Gastrointestinal:  Positive for diarrhea and nausea. Negative for vomiting.  ?Musculoskeletal:  Positive for myalgias. Negative for joint pain.  ?Neurological:  Positive for headaches. Negative for dizziness.  ? ? ?Objective:  ? ?Today's  Vitals: There were no vitals taken for this visit. ? ?  07/09/2021  ?  4:04 PM 06/12/2021  ?  2:22 PM 04/24/2021  ?  2:36 PM  ?Vitals with BMI  ?Height '5\' 6"'$  '5\' 6"'$  '5\' 6"'$   ?Weight 221 lbs 230 lbs 221 lbs  ?BMI 35.69 37.14 35.69  ?Systolic 161 096 045  ?Diastolic  86 88 70  ?Pulse 87 73 85  ?  ? ?Physical Exam ?Comprehensive physical exam not completed today as office visit was conducted remotely.  She did not have any vital signs available to me at time of visit, she did sound tired but was able to speak in complete sentences without having to stop to breathe.  I did note coughing.  Patient was alert and oriented, and appeared to have appropriate judgment. ? ? ? ? ? ? ?Assessment and Plan  ? ?1. Acute upper respiratory infection   ? ? ? ?Plan: ?1.  Etiology unknown, suspect community-acquired pneumonia.  She is already failed treatment with Augmentin.  I am very concerned about her reported lethargy especially in the absence of available vital signs.  Additionally, she lives alone without support. She also continues to spike fevers.  Per shared decision making she we will proceed to the emergency department for evaluation and management.  We discussed transportation via ambulance versus driving herself, she feels that she can comfortably and safely transport herself to the emergency department.  She was encouraged to consider calling 911 if there is any question regarding ability to drive. ? ? ?Tests ordered ?No orders of the defined types were placed in this encounter. ? ? ? ? ?No orders of the defined types were placed in this encounter. ? ? ?Patient to follow-up as needed, total time spent on the telephone was 20 minutes and 30 seconds. ? ?Ailene Ards, NP ? ?

## 2021-07-17 ENCOUNTER — Emergency Department (HOSPITAL_BASED_OUTPATIENT_CLINIC_OR_DEPARTMENT_OTHER): Payer: Medicare HMO

## 2021-07-17 ENCOUNTER — Other Ambulatory Visit: Payer: Self-pay

## 2021-07-17 ENCOUNTER — Emergency Department (HOSPITAL_BASED_OUTPATIENT_CLINIC_OR_DEPARTMENT_OTHER)
Admission: EM | Admit: 2021-07-17 | Discharge: 2021-07-17 | Disposition: A | Discharge status: Home / Self Care | Payer: Medicare HMO | Attending: Emergency Medicine | Admitting: Emergency Medicine

## 2021-07-17 ENCOUNTER — Encounter (HOSPITAL_BASED_OUTPATIENT_CLINIC_OR_DEPARTMENT_OTHER): Payer: Self-pay | Admitting: Emergency Medicine

## 2021-07-17 DIAGNOSIS — J069 Acute upper respiratory infection, unspecified: Secondary | ICD-10-CM | POA: Diagnosis not present

## 2021-07-17 DIAGNOSIS — R0602 Shortness of breath: Secondary | ICD-10-CM | POA: Diagnosis not present

## 2021-07-17 DIAGNOSIS — Z20822 Contact with and (suspected) exposure to covid-19: Secondary | ICD-10-CM | POA: Diagnosis not present

## 2021-07-17 DIAGNOSIS — D72829 Elevated white blood cell count, unspecified: Secondary | ICD-10-CM | POA: Diagnosis not present

## 2021-07-17 LAB — CBC WITH DIFFERENTIAL/PLATELET
Abs Immature Granulocytes: 0.08 10*3/uL — ABNORMAL HIGH (ref 0.00–0.07)
Basophils Absolute: 0 10*3/uL (ref 0.0–0.1)
Basophils Relative: 0 %
Eosinophils Absolute: 0.2 10*3/uL (ref 0.0–0.5)
Eosinophils Relative: 2 %
HCT: 34.9 % — ABNORMAL LOW (ref 36.0–46.0)
Hemoglobin: 10.6 g/dL — ABNORMAL LOW (ref 12.0–15.0)
Immature Granulocytes: 1 %
Lymphocytes Relative: 24 %
Lymphs Abs: 2.6 10*3/uL (ref 0.7–4.0)
MCH: 26.1 pg (ref 26.0–34.0)
MCHC: 30.4 g/dL (ref 30.0–36.0)
MCV: 86 fL (ref 80.0–100.0)
Monocytes Absolute: 0.6 10*3/uL (ref 0.1–1.0)
Monocytes Relative: 6 %
Neutro Abs: 7.2 10*3/uL (ref 1.7–7.7)
Neutrophils Relative %: 67 %
Platelets: 442 10*3/uL — ABNORMAL HIGH (ref 150–400)
RBC: 4.06 MIL/uL (ref 3.87–5.11)
RDW: 15.3 % (ref 11.5–15.5)
WBC: 10.8 10*3/uL — ABNORMAL HIGH (ref 4.0–10.5)
nRBC: 0 % (ref 0.0–0.2)

## 2021-07-17 LAB — RESP PANEL BY RT-PCR (FLU A&B, COVID) ARPGX2
Influenza A by PCR: NEGATIVE
Influenza B by PCR: NEGATIVE
SARS Coronavirus 2 by RT PCR: NEGATIVE

## 2021-07-17 LAB — BASIC METABOLIC PANEL
Anion gap: 8 (ref 5–15)
BUN: 12 mg/dL (ref 8–23)
CO2: 23 mmol/L (ref 22–32)
Calcium: 8.7 mg/dL — ABNORMAL LOW (ref 8.9–10.3)
Chloride: 112 mmol/L — ABNORMAL HIGH (ref 98–111)
Creatinine, Ser: 0.75 mg/dL (ref 0.44–1.00)
GFR, Estimated: >60 mL/min (ref >60)
Glucose, Bld: 106 mg/dL — ABNORMAL HIGH (ref 70–99)
Potassium: 3.5 mmol/L (ref 3.5–5.1)
Sodium: 143 mmol/L (ref 135–145)

## 2021-07-17 LAB — TROPONIN I (HIGH SENSITIVITY): Troponin I (High Sensitivity): 4 ng/L (ref <18)

## 2021-07-17 LAB — BRAIN NATRIURETIC PEPTIDE: B Natriuretic Peptide: 91.4 pg/mL (ref 0.0–100.0)

## 2021-07-17 MED ORDER — ALBUTEROL SULFATE (2.5 MG/3ML) 0.083% IN NEBU: 2.5000 mg | INHALATION_SOLUTION | Freq: Once | RESPIRATORY_TRACT | Status: AC

## 2021-07-17 MED ORDER — ACETAMINOPHEN 325 MG PO TABS
650.0000 mg | ORAL_TABLET | Freq: Once | ORAL | Status: AC
  Administered 2021-07-17: 650 mg via ORAL
  Filled 2021-07-17: qty 2

## 2021-07-17 MED ORDER — SODIUM CHLORIDE 0.9 % IV SOLN
1.0000 g | Freq: Once | INTRAVENOUS | Status: AC
  Administered 2021-07-17: 1 g via INTRAVENOUS
  Filled 2021-07-17: qty 10

## 2021-07-17 MED ORDER — PREDNISONE 10 MG PO TABS
60.0000 mg | ORAL_TABLET | Freq: Once | ORAL | Status: AC
  Administered 2021-07-17: 60 mg via ORAL
  Filled 2021-07-17: qty 1

## 2021-07-17 MED ORDER — ALBUTEROL SULFATE (2.5 MG/3ML) 0.083% IN NEBU
INHALATION_SOLUTION | RESPIRATORY_TRACT | Status: AC
  Administered 2021-07-17: 2.5 mg via RESPIRATORY_TRACT
  Filled 2021-07-17: qty 3

## 2021-07-17 MED ORDER — AZITHROMYCIN 250 MG PO TABS: 250.0000 mg | ORAL_TABLET | Freq: Every day | ORAL | 0 refills | Status: DC

## 2021-07-17 MED ORDER — PREDNISONE 20 MG PO TABS
20.0000 mg | ORAL_TABLET | Freq: Two times a day (BID) | ORAL | 0 refills | Status: AC
Start: 2021-07-17 — End: 2021-07-22

## 2021-07-17 MED ORDER — IPRATROPIUM-ALBUTEROL 0.5-2.5 (3) MG/3ML IN SOLN: 3.0000 mL | Freq: Once | RESPIRATORY_TRACT | Status: AC

## 2021-07-17 MED ORDER — IPRATROPIUM-ALBUTEROL 0.5-2.5 (3) MG/3ML IN SOLN
RESPIRATORY_TRACT | Status: AC
  Administered 2021-07-17: 3 mL via RESPIRATORY_TRACT
  Filled 2021-07-17: qty 3

## 2021-07-17 NOTE — ED Triage Notes (Addendum)
Sob that started yesterday , txment for ear infection since last Thursday , cough started 1 week ago  diarrhea started  last tuesday ?

## 2021-07-17 NOTE — ED Provider Notes (Signed)
?[facility] EMERGENCY DEPT ?Provider Note ? ? ?CSN: [id number] ?Arrival date & time: 07/17/21  1447 ? ?  ? ?History ? ?Chief Complaint  ?Patient presents with  ? Shortness of Breath  ? ? ?[name] is a 66 y.o. female. ? ?Patient presents ER chief complaint of cough and congestion almost a week ago.  States is getting worse.  Associate with some shortness of breath.  Denies headache or chest pain.  Denies abdominal pain.  No reports of fevers at home. ? ? ?  ? ?Home Medications ?Prior to Admission medications   ?Medication Sig Start Date End Date Taking? Authorizing Provider  ?azithromycin (ZITHROMAX) 250 MG tablet Take 1 tablet (250 mg total) by mouth daily. Take first 2 tablets together, then 1 every day until finished. 07/17/21  Yes Luna Fuse, MD  ?predniSONE (DELTASONE) 20 MG tablet Take 1 tablet (20 mg total) by mouth 2 (two) times daily with a meal for 5 days. 07/17/21 07/22/21 Yes Luna Fuse, MD  ?ACCU-CHEK [facility] LANCETS lancets CHECK BLOOD SUGARS TWICE DAILY 01/26/17   Hoyt Koch, MD  ?Acetaminophen-Codeine 300-30 MG tablet Take 1 tablet by mouth every 6 (six) hours as needed for pain. 07/09/21   Horald Pollen, MD  ?albuterol (VENTOLIN HFA) 108 (90 Base) MCG/ACT inhaler INHALE 2 PUFFS INTO THE LUNGS EVERY 6 HOURS AS NEEDED FOR WHEEZING OR SHORTNESS OF BREATH 02/07/20   Hoyt Koch, MD  ?Alcohol Swabs (B-D SINGLE USE SWABS REGULAR) PADS USE AS DIRECTED EVERY DAY 04/15/20   Hoyt Koch, MD  ?alendronate (FOSAMAX) 70 MG tablet Take 1 tablet (70 mg total) by mouth every 7 (seven) days. Take with a full glass of water on an empty stomach. 04/03/21   Hoyt Koch, MD  ?amLODipine (NORVASC) 10 MG tablet TAKE 1 TABLET EVERY DAY 02/11/21   Hoyt Koch, MD  ?Blood Glucose Calibration (ACCU-CHEK AVIVA) SOLN Use as directed 10/29/15   Hoyt Koch, MD  ?Blood Glucose Monitoring Suppl (ACCU-CHEK AVIVA PLUS) w/Device KIT USE AS  DIRECTED  TO CHECK BLOOD SUGAR EVERY DAY 11/05/19   Hoyt Koch, MD  ?buPROPion (WELLBUTRIN XL) 300 MG 24 hr tablet Take 1 tablet (300 mg total) by mouth daily. 02/02/21   Hoyt Koch, MD  ?fluticasone-salmeterol (ADVAIR) 100-50 MCG/ACT AEPB Inhale 1 puff into the lungs 2 (two) times daily. 12/02/20   Hoyt Koch, MD  ?gabapentin (NEURONTIN) 100 MG capsule Take 2 capsules (200 mg total) by mouth 2 (two) times daily. 04/24/21   Lyndal Pulley, DO  ?glucose blood (ACCU-CHEK AVIVA PLUS) test strip 1 each by Other route 2 (two) times daily. Use to check blood sugars twice a day 11/15/19   Hoyt Koch, MD  ?lipase/protease/amylase (CREON) 36000 UNITS CPEP capsule Take 1 capsule by mouth in the morning, at noon, in the evening, and at bedtime. 04/28/20   [provider]  ?ondansetron (ZOFRAN) 4 MG tablet Take 1 tablet (4 mg total) by mouth every 8 (eight) hours as needed for nausea or vomiting. 03/17/21   Hoyt Koch, MD  ?PARoxetine (PAXIL) 10 MG tablet TAKE 1 TABLET(10 MG) BY MOUTH DAILY 05/19/21   Hoyt Koch, MD  ?tobramycin (TOBREX) 0.3 % ophthalmic solution Place 2 drops into both eyes every 6 (six) hours. 07/09/21   Horald Pollen, MD  ?   ? ?Allergies    ?Carafate [sucralfate] and Sulfonamide derivatives   ? ?Review of Systems   ?  Review of Systems  ?Constitutional:  Negative for fever.  ?HENT:  Negative for ear pain.   ?Eyes:  Negative for pain.  ?Respiratory:  Positive for cough and shortness of breath.   ?Cardiovascular:  Negative for chest pain.  ?Gastrointestinal:  Negative for abdominal pain.  ?Genitourinary:  Negative for flank pain.  ?Musculoskeletal:  Negative for back pain.  ?Skin:  Negative for rash.  ?Neurological:  Negative for headaches.  ? ?Physical Exam ?Updated Vital Signs ?BP 114/61 (BP Location: Right Arm)   Pulse 72   Temp 98 ?F (36.7 ?C) (Oral)   Resp 17   Ht $R'5\' 6"'Dy$  (1.676 m)   Wt 100.2 kg   SpO2 98%   BMI 35.67 kg/m?   ?Physical Exam ?Constitutional:   ?   General: She is not in acute distress. ?   Appearance: Normal appearance.  ?HENT:  ?   Head: Normocephalic.  ?   Nose: Nose normal.  ?Eyes:  ?   Extraocular Movements: Extraocular movements intact.  ?Cardiovascular:  ?   Rate and Rhythm: Normal rate.  ?Pulmonary:  ?   Effort: Pulmonary effort is normal.  ?   Breath sounds: Wheezing and rhonchi present.  ?Musculoskeletal:     ?   General: Normal range of motion.  ?   Cervical back: Normal range of motion.  ?Neurological:  ?   General: No focal deficit present.  ?   Mental Status: She is alert. Mental status is at baseline.  ? ? ?ED Results / Procedures / Treatments   ?Labs ?(all labs ordered are listed, but only abnormal results are displayed) ?Labs Reviewed  ?CBC WITH DIFFERENTIAL/PLATELET - Abnormal; Notable for the following components:  ?    Result Value  ? WBC 10.8 (*)   ? Hemoglobin 10.6 (*)   ? HCT 34.9 (*)   ? Platelets 442 (*)   ? Abs Immature Granulocytes 0.08 (*)   ? All other components within normal limits  ?BASIC METABOLIC PANEL - Abnormal; Notable for the following components:  ? Chloride 112 (*)   ? Glucose, Bld 106 (*)   ? Calcium 8.7 (*)   ? All other components within normal limits  ?RESP PANEL BY RT-PCR (FLU A&B, COVID) ARPGX2  ?BRAIN NATRIURETIC PEPTIDE  ?TROPONIN I (HIGH SENSITIVITY)  ? ? ?EKG ?EKG Interpretation ? ?Date/Time:  Friday July 17 2021 14:57:52 EDT ?Ventricular Rate:  81 ?PR Interval:  128 ?QRS Duration: 90 ?QT Interval:  389 ?QTC Calculation: 452 ?R Axis:   70 ?Text Interpretation: Sinus rhythm Confirmed by Thamas Jaegers ([id number]) on 07/17/2021 3:14:39 PM ? ?Radiology ?DG Chest Portable 1 View ? ?Result Date: 07/17/2021 ?CLINICAL DATA:  sob EXAM: PORTABLE CHEST 1 VIEW COMPARISON:  CT heart 09/21/2019 FINDINGS: Persistent prominent ascending thoracic aorta and enlarged cardiac silhouette. The heart and mediastinal contours are unchanged. Patchy airspace and interstitial opacity within bilateral lower  lung zones, right greater than left. No pulmonary edema. No pleural effusion. No pneumothorax. No acute osseous abnormality. IMPRESSION: 1. Persistent prominent ascending thoracic aorta and enlarged cardiac silhouette. 2. Patchy airspace and interstitial opacity within bilateral lower lung zones, right greater than left. Finding could represent edema versus pneumonia. Followup PA and lateral chest X-ray is recommended in 3-4 weeks following therapy to ensure resolution. Electronically Signed   By: Iven Finn M.D.   On: 07/17/2021 15:36   ? ?Procedures ?Marland KitchenCritical Care ?Performed by: Luna Fuse, MD ?Authorized by: Luna Fuse, MD  ? ?Critical care provider statement:  ?  Critical care time (minutes):  30 ?  Critical care time was exclusive of:  Separately billable procedures and treating other patients and teaching time ?  Critical care was necessary to treat or prevent imminent or life-threatening deterioration of the following conditions:  Respiratory failure  ? ? ?Medications Ordered in ED ?Medications  ?cefTRIAXone (ROCEPHIN) 1 g in sodium chloride 0.9 % 100 mL IVPB (has no administration in time range)  ?ipratropium-albuterol (DUONEB) 0.5-2.5 (3) MG/3ML nebulizer solution 3 mL (3 mLs Nebulization Given 07/17/21 1502)  ?albuterol (PROVENTIL) (2.5 MG/3ML) 0.083% nebulizer solution 2.5 mg (2.5 mg Nebulization Given 07/17/21 1502)  ?predniSONE (DELTASONE) tablet 60 mg (60 mg Oral Given 07/17/21 1608)  ?acetaminophen (TYLENOL) tablet 650 mg (650 mg Oral Given 07/17/21 1634)  ? ? ?ED Course/ Medical Decision Making/ A&P ?  ?                        ?Medical Decision Making ?Amount and/or Complexity of Data Reviewed ?Labs: ordered. ?Radiology: ordered. ? ?Risk ?OTC drugs. ? ? ?Chart review shows video visit with internal medicine yesterday. ? ?Cardiac monitor shows sinus rhythm. ? ?Work-up shows white count mildly elevated 10.8, chemistry normal, troponin negative.  proBNP normal as well.  COVID panel sent and  negative. ? ?Patient is wheezes treated with albuterol x3 with significant improvement.  Prednisone provided as well. ? ?Chest x-ray is equivocal for atelectasis versus pneumonia.  Given her comorbidities and

## 2021-07-17 NOTE — ED Notes (Signed)
Discharge held due to Antibiotics still running. ? ?

## 2021-07-17 NOTE — Discharge Instructions (Addendum)
Your test today were normal appearing.  Take the antibiotics and medications as written.  Call your primary care doctor schedule appointment this week. ? ?Return immediately back to the ER if: ? ?Your symptoms worsen within the next 12-24 hours. ?You develop new symptoms such as new fevers, persistent vomiting, new pain, shortness of breath, or new weakness or numbness, or if you have any other concerns. ? ?

## 2021-07-17 NOTE — ED Notes (Signed)
Pt not in room when Rn came to do discharge charting while waiting for paperwork to be printed. Pt found out front about to get into [employer], states she was told she was up for discharge and had called a ride. States that she didn't want to miss her ride and is planning to just check her MyChart account for her discharge summary. Confirmed with pt that IV was removed by NT prior to leaving. Ambulatory, A&Ox4. Would prefer not to stay for additional V/S, D/C paperwork printing , etc.  ?

## 2021-07-20 ENCOUNTER — Ambulatory Visit (INDEPENDENT_AMBULATORY_CARE_PROVIDER_SITE_OTHER): Payer: Medicare HMO

## 2021-07-20 DIAGNOSIS — I10 Essential (primary) hypertension: Secondary | ICD-10-CM

## 2021-07-20 DIAGNOSIS — R0609 Other forms of dyspnea: Secondary | ICD-10-CM

## 2021-07-20 NOTE — Progress Notes (Signed)
? ?Chronic Care Management ?Pharmacy Note ? ?07/20/2021 ?Name:  [name] MRN:  [id number] DOB:  1955/06/06 ? ?Summary: ?-Patient was recently seen in ED due to ongoing URI - was rx'd augmentin and prednisone - reports that symptoms remain relatively unchanged  ?-Notes that since being rx'd augmentin in March - has been having ongoing issues with diarrhea - pt concerned about possible c diff as she has had in the past ?-Denies any issues with her chronic medications  ? ?Recommendations/Changes made from today's visit: ?-Offered to schedule PCP appointment over concerns with ongoing diarrhea - patient declined at this time - advised for patient to remain well hydrated - patient to continue to monitor and reach out should symptoms not improve ?-Recommend to continue all medications, to reach out with any issues or concerns  ? ? ?Subjective: ?[name] is an 66 y.o. year old female who is a primary patient of Myrlene Broker, MD.  The CCM team was consulted for assistance with disease management and care coordination needs.   ? ?Engaged with patient by telephone for follow up visit in response to provider referral for pharmacy case management and/or care coordination services.  ? ?Consent to Services:  ?The patient was given information about Chronic Care Management services, agreed to services, and gave verbal consent prior to initiation of services.  Please see initial visit note for detailed documentation.  ? ?Patient Care Team: ?Myrlene Broker, MD as PCP - General (Internal Medicine) ?Ellin Saba, [facility] (Pharmacist) ? ?Recent office visits: ?07/16/2021 - Jiles Prows NP  - URI - recommended ED visit  ?07/09/2021 - Dr. Alvy Bimler  - Right otitis media - rx'd Augmentin, tobramycin eye drops, APAP #3 ?02/02/2022 - Dr. Okey Dupre  - increase wellbutrin to 300mg  daily  ? ?Recent consult visits: ?06/12/2021 - Dr. 06/14/2021 - Sports Medicine  - Steroid injection given - f/u in 6-8 weeks  ?04/24/2021 -  Dr. 06/22/2021 - Sports Medicine - rx'd exercises, thigh compression, voltaren  - f/u in 6 weeks  ? ?Hospital visits: ?07/17/2021 - ED visit - SOB , cough, congestion x 1 week - rx'd prednisone and azithromycin  ? ? ?Objective: ? ?Lab Results  ?Component Value Date  ? CREATININE 0.75 07/17/2021  ? BUN 12 07/17/2021  ? GFR 74.23 10/15/2020  ? GFRNONAA >60 07/17/2021  ? GFRAA 95 05/01/2019  ? NA 143 07/17/2021  ? K 3.5 07/17/2021  ? CALCIUM 8.7 (L) 07/17/2021  ? CO2 23 07/17/2021  ? GLUCOSE 106 (H) 07/17/2021  ? ? ?Lab Results  ?Component Value Date/Time  ? HGBA1C 5.5 01/02/2019 02:03 PM  ? HGBA1C 5.2 12/11/2015 02:23 PM  ? GFR 74.23 10/15/2020 04:15 PM  ? GFR 84.06 08/12/2020 02:41 PM  ? MICROALBUR <0.7 01/02/2019 02:03 PM  ?  ?Last diabetic Eye exam:  ?Lab Results  ?Component Value Date/Time  ? HMDIABEYEEXA No Retinopathy 12/09/2015 12:00 AM  ?  ?Last diabetic Foot exam: No results found for: HMDIABFOOTEX  ? ?Lab Results  ?Component Value Date  ? CHOL 146 01/02/2019  ? HDL 73.40 01/02/2019  ? LDLCALC 60 01/02/2019  ? TRIG 66.0 01/02/2019  ? CHOLHDL 2 01/02/2019  ? ? ? ?  Latest Ref Rng & Units 10/15/2020  ?  4:15 PM 08/12/2020  ?  2:41 PM 02/22/2020  ?  1:55 PM  ?Hepatic Function  ?Total Protein 6.0 - 8.3 g/dL 6.1   6.6   6.5    ?Albumin 3.5 - 5.2 g/dL 3.6  3.6   3.6    ?AST 0 - 37 U/L 23   20   33    ?ALT 0 - 35 U/L 17   22   25     ?Alk Phosphatase 39 - 117 U/L 121   127   116    ?Total Bilirubin 0.2 - 1.2 mg/dL 0.4   0.3   0.3    ? ? ?Lab Results  ?Component Value Date/Time  ? TSH 0.55 10/15/2020 04:15 PM  ? TSH 0.98 01/02/2019 02:03 PM  ? ? ? ?  Latest Ref Rng & Units 07/17/2021  ?  4:20 PM 08/12/2020  ?  2:41 PM 02/22/2020  ?  2:53 PM  ?CBC  ?WBC 4.0 - 10.5 K/uL 10.8   8.8   6.1    ?Hemoglobin 12.0 - 15.0 g/dL 10.6   12.4   12.5    ?Hematocrit 36.0 - 46.0 % 34.9   38.2   39.1    ?Platelets 150 - 400 K/uL 442   217.0   198    ? ? ?Lab Results  ?Component Value Date/Time  ? VD25OH 13.86 (L) 02/02/2021 03:20 PM  ? VD25OH  <7.00 (L) 10/15/2020 04:15 PM  ? ? ?Clinical ASCVD: No  ?The 10-year ASCVD risk score (Arnett DK, et al., 2019) is: 15.3% ?  Values used to calculate the score: ?    Age: 26 years ?    Sex: Female ?    Is Non-Hispanic African American: Yes ?    Diabetic: No ?    Tobacco smoker: Yes ?    Systolic Blood Pressure: 997 mmHg ?    Is BP treated: Yes ?    HDL Cholesterol: 73.4 mg/dL ?    Total Cholesterol: 146 mg/dL   ? ? ?  12/02/2020  ?  2:11 PM 02/28/2020  ? 10:30 AM 10/26/2018  ?  8:41 AM  ?Depression screen PHQ 2/9  ?Decreased Interest 0 3 2  ?Down, Depressed, Hopeless 1 2 1   ?PHQ - 2 Score 1 5 3   ?Altered sleeping 1 3 2   ?Tired, decreased energy 3 3 1   ?Change in appetite 3 3 1   ?Feeling bad or failure about yourself  0 0 1  ?Trouble concentrating 0 0 1  ?Moving slowly or fidgety/restless 0 0 0  ?Suicidal thoughts  0 0  ?PHQ-9 Score 8 14 9   ?Difficult doing work/chores  Somewhat difficult   ?  ? ?  10/26/2018  ?  8:41 AM 05/19/2018  ?  4:13 PM  ?GAD 7 : Generalized Anxiety Score  ?Nervous, Anxious, on Edge 1 1  ?Control/stop worrying 1 3  ?Worry too much - different things 1 3  ?Trouble relaxing 1 2  ?Restless 3 2  ?Easily annoyed or irritable 3 3  ?Afraid - awful might happen 2 1  ?Total GAD 7 Score 12 15  ? ? ? ?Social History  ? ?Tobacco Use  ?Smoking Status Former  ? Packs/day: 0.50  ? Years: 10.00  ? Pack years: 5.00  ? Types: Cigarettes  ? Quit date: 04/19/2009  ? Years since quitting: 12.2  ?Smokeless Tobacco Never  ? ?BP Readings from Last 3 Encounters:  ?07/17/21 132/71  ?07/09/21 138/86  ?06/12/21 138/88  ? ?Pulse Readings from Last 3 Encounters:  ?07/17/21 68  ?07/09/21 87  ?06/12/21 73  ? ?Wt Readings from Last 3 Encounters:  ?07/17/21 220 lb 15.8 oz (100.2 kg)  ?07/09/21 221 lb (100.2 kg)  ?06/12/21 230 lb (  104.3 kg)  ? ?BMI Readings from Last 3 Encounters:  ?07/17/21 35.67 kg/m?  ?07/09/21 35.67 kg/m?  ?06/12/21 37.12 kg/m?  ? ? ?Assessment/Interventions: Review of patient past medical history, allergies,  medications, health status, including review of consultants reports, laboratory and other test data, was performed as part of comprehensive evaluation and provision of chronic care management services.  ? ?SDOH:  (Social Determinants of Health) assessments and interventions performed: Yes ? ?SDOH Screenings  ? ?Alcohol Screen: Low Risk   ? Last Alcohol Screening Score (AUDIT): 1  ?Depression (PHQ2-9): Medium Risk  ? PHQ-2 Score: 8  ?Financial Resource Strain: Medium Risk  ? Difficulty of Paying Living Expenses: Somewhat hard  ?Food Insecurity: Food Insecurity Present  ? Worried About Charity fundraiser in the Last Year: Sometimes true  ? Ran Out of Food in the Last Year: Sometimes true  ?Housing: Low Risk   ? Last Housing Risk Score: 0  ?Physical Activity: Unknown  ? Days of Exercise per Week: 0 days  ? Minutes of Exercise per Session: Not on file  ?Social Connections: Unknown  ? Frequency of Communication with Friends and Family: More than three times a week  ? Frequency of Social Gatherings with Friends and Family: Once a week  ? Attends Religious Services: Patient refused  ? Active Member of Clubs or Organizations: Yes  ? Attends Archivist Meetings: 1 to 4 times per year  ? Marital Status: Widowed  ?Stress: Stress Concern Present  ? Feeling of Stress : To some extent  ?Tobacco Use: Medium Risk  ? Smoking Tobacco Use: Former  ? Smokeless Tobacco Use: Never  ? Passive Exposure: Not on file  ?Transportation Needs: Unmet Transportation Needs  ? Lack of Transportation (Medical): Yes  ? Lack of Transportation (Non-Medical): No  ? ? ?CCM Care Plan ? ?Allergies  ?Allergen Reactions  ? Carafate [Sucralfate] Hives, Itching and Other (See Comments)  ?  Angioedema  ? ?  ? Sulfonamide Derivatives Rash  ?  Syncope  ? ? ?Medications Reviewed Today   ? ? Reviewed by Tomasa Blase, [facility] (Pharmacist) on 07/20/21 at 1129  Med List Status: <None>  ? ?Medication Order Taking? Sig Documenting Provider Last Dose Status  Informant  ?ACCU-CHEK SOFTCLIX LANCETS lancets [id number] Yes CHECK BLOOD SUGARS TWICE DAILY Hoyt Koch, MD Taking Active   ?Acetaminophen-Codeine 300-30 MG tablet [id number]  Take 1 tablet by mouth every

## 2021-07-20 NOTE — Patient Instructions (Signed)
Visit Information ? ?Following are the goals we discussed today:  ? ?Manage My Medicine  ? ?Timeframe:  Long-Range Goal ?Priority:  Medium ?Start Date:       11/11/20                      ?Expected End Date:    07/2022                ? ?Follow Up Date: August 2023 ?  ?- call for medicine refill 2 or 3 days before it runs out ?- call if I am sick and can't take my medicine ?- keep a list of all the medicines I take; vitamins and herbals too  ?  ?Why is this important?   ?These steps will help you keep on track with your medicines. ? ?Plan: Telephone follow up appointment with care management team member scheduled for:  4 months ?The patient has been provided with contact information for the care management team and has been advised to call with any health related questions or concerns.  ? ?Tomasa Blase, PharmD ?Clinical Pharmacist, [place]  ? ?Please call the care guide team at [phone] if you need to cancel or reschedule your appointment.  ? ?Patient verbalizes understanding of instructions and care plan provided today and agrees to view in [place]. Active MyChart status confirmed with patient.   ? ?

## 2021-07-22 ENCOUNTER — Ambulatory Visit (INDEPENDENT_AMBULATORY_CARE_PROVIDER_SITE_OTHER): Payer: Medicare HMO

## 2021-07-22 ENCOUNTER — Ambulatory Visit (INDEPENDENT_AMBULATORY_CARE_PROVIDER_SITE_OTHER): Payer: Medicare HMO | Admitting: Internal Medicine

## 2021-07-22 ENCOUNTER — Encounter: Payer: Self-pay | Admitting: Internal Medicine

## 2021-07-22 VITALS — BP 136/80 | HR 73 | Resp 18 | Ht 66.0 in | Wt 234.8 lb

## 2021-07-22 DIAGNOSIS — R197 Diarrhea, unspecified: Secondary | ICD-10-CM | POA: Diagnosis not present

## 2021-07-22 DIAGNOSIS — R0609 Other forms of dyspnea: Secondary | ICD-10-CM | POA: Diagnosis not present

## 2021-07-22 DIAGNOSIS — J189 Pneumonia, unspecified organism: Secondary | ICD-10-CM

## 2021-07-22 DIAGNOSIS — A498 Other bacterial infections of unspecified site: Secondary | ICD-10-CM | POA: Diagnosis not present

## 2021-07-22 LAB — CBC WITH DIFFERENTIAL/PLATELET
Basophils Absolute: 0 10*3/uL (ref 0.0–0.1)
Basophils Relative: 0.4 % (ref 0.0–3.0)
Eosinophils Absolute: 0.1 10*3/uL (ref 0.0–0.7)
Eosinophils Relative: 0.6 % (ref 0.0–5.0)
HCT: 34.8 % — ABNORMAL LOW (ref 36.0–46.0)
Hemoglobin: 11.2 g/dL — ABNORMAL LOW (ref 12.0–15.0)
Lymphocytes Relative: 36.5 % (ref 12.0–46.0)
Lymphs Abs: 3.9 10*3/uL (ref 0.7–4.0)
MCHC: 32.2 g/dL (ref 30.0–36.0)
MCV: 83.3 fl (ref 78.0–100.0)
Monocytes Absolute: 0.6 10*3/uL (ref 0.1–1.0)
Monocytes Relative: 6 % (ref 3.0–12.0)
Neutro Abs: 6.1 10*3/uL (ref 1.4–7.7)
Neutrophils Relative %: 56.5 % (ref 43.0–77.0)
Platelets: 500 10*3/uL — ABNORMAL HIGH (ref 150.0–400.0)
RBC: 4.18 Mil/uL (ref 3.87–5.11)
RDW: 15.6 % — ABNORMAL HIGH (ref 11.5–15.5)
WBC: 10.7 10*3/uL — ABNORMAL HIGH (ref 4.0–10.5)

## 2021-07-22 LAB — COMPREHENSIVE METABOLIC PANEL
ALT: 93 U/L — ABNORMAL HIGH (ref 0–35)
AST: 59 U/L — ABNORMAL HIGH (ref 0–37)
Albumin: 3.8 g/dL (ref 3.5–5.2)
Alkaline Phosphatase: 153 U/L — ABNORMAL HIGH (ref 39–117)
BUN: 15 mg/dL (ref 6–23)
CO2: 20 mEq/L (ref 19–32)
Calcium: 8.9 mg/dL (ref 8.4–10.5)
Chloride: 112 mEq/L (ref 96–112)
Creatinine, Ser: 0.86 mg/dL (ref 0.40–1.20)
GFR: 69.73 mL/min (ref >60.00)
Glucose, Bld: 93 mg/dL (ref 70–99)
Potassium: 3 mEq/L — ABNORMAL LOW (ref 3.5–5.1)
Sodium: 141 mEq/L (ref 135–145)
Total Bilirubin: 0.3 mg/dL (ref 0.2–1.2)
Total Protein: 7.2 g/dL (ref 6.0–8.3)

## 2021-07-22 MED ORDER — ALBUTEROL SULFATE (2.5 MG/3ML) 0.083% IN NEBU: 2.5000 mg | INHALATION_SOLUTION | Freq: Four times a day (QID) | RESPIRATORY_TRACT | 1 refills | Status: DC | PRN

## 2021-07-22 MED ORDER — NEOMYCIN-POLYMYXIN-HC 3.5-10000-1 OT SOLN: 3.0000 [drp] | Freq: Three times a day (TID) | OTIC | 0 refills | Status: DC

## 2021-07-22 NOTE — Patient Instructions (Addendum)
We will check the labs and for C dif today. ? ?We will do the chest x-ray today ? ?We have sent in the ear drops to use 3 drops 3 times a day for 3 days for the right ear. ?

## 2021-07-22 NOTE — Progress Notes (Signed)
? ?  Subjective:  ? ?Patient ID: [name], female    DOB: 10-05-1955, 66 y.o.   MRN: [id number] ? ?HPI ?The patient is a 66 YO female coming in for a lot of struggles lately and health concerns. Several recent courses of antibiotics. New/worsening diarrhea. 3 different antibiotics in last month. Prior C dif. ? ?Review of Systems  ?Constitutional: Negative.   ?HENT: Negative.    ?Eyes: Negative.   ?Respiratory:  Negative for cough, chest tightness and shortness of breath.   ?Cardiovascular:  Negative for chest pain, palpitations and leg swelling.  ?Gastrointestinal:  Positive for diarrhea. Negative for abdominal distention, abdominal pain, constipation, nausea and vomiting.  ?Musculoskeletal: Negative.   ?Skin: Negative.   ?Neurological: Negative.   ?Psychiatric/Behavioral: Negative.    ? ?Objective:  ?Physical Exam ?Constitutional:   ?   Appearance: She is well-developed. She is ill-appearing.  ?HENT:  ?   Head: Normocephalic and atraumatic.  ?Cardiovascular:  ?   Rate and Rhythm: Normal rate and regular rhythm.  ?Pulmonary:  ?   Effort: Pulmonary effort is normal. No respiratory distress.  ?   Breath sounds: Normal breath sounds. No wheezing or rales.  ?Abdominal:  ?   General: Bowel sounds are normal. There is no distension.  ?   Palpations: Abdomen is soft.  ?   Tenderness: There is no abdominal tenderness. There is no rebound.  ?Musculoskeletal:  ?   Cervical back: Normal range of motion.  ?Skin: ?   General: Skin is warm and dry.  ?Neurological:  ?   Mental Status: She is alert and oriented to person, place, and time.  ?   Coordination: Coordination normal.  ? ? ?Vitals:  ? 07/22/21 1019  ?BP: 136/80  ?Pulse: 73  ?Resp: 18  ?SpO2: 99%  ?Weight: 234 lb 12.8 oz (106.5 kg)  ?Height: '5\' 6"'$  (1.676 m)  ? ? ?This visit occurred during the SARS-CoV-2 public health emergency.  Safety protocols were in place, including screening questions prior to the visit, additional usage of staff PPE, and extensive cleaning  of exam room while observing appropriate contact time as indicated for disinfecting solutions.  ? ?Assessment & Plan:  ? ?

## 2021-07-23 DIAGNOSIS — J189 Pneumonia, unspecified organism: Secondary | ICD-10-CM | POA: Insufficient documentation

## 2021-07-23 HISTORY — DX: Pneumonia, unspecified organism: J18.9

## 2021-07-23 NOTE — Assessment & Plan Note (Signed)
She has high risk of C dif currently with 3 different antibiotic courses in the last month and new/worsening diarrhea. She will need prolonged taper of vancomycin if positive for C dif.  ?

## 2021-07-23 NOTE — Assessment & Plan Note (Addendum)
Checking CXR for improvement/resolution since she has finished z-pack. If worsening or no improvement can consider treatment. Given there is high concern for C dif currently we would like to avoid further antibiotics if possible. Still with fluid in her ear rx cortisporin ear drops and albuterol nebulizer solution to help with SOB and congestion. ?

## 2021-07-23 NOTE — Assessment & Plan Note (Signed)
Checking GI profile to rule out/in pathogen causing her diarrhea. Highly suspicious for C dif as she has had twice previous and feel similar to her with 3 different antibiotics recently.  ?

## 2021-07-23 NOTE — Progress Notes (Deleted)
?[name] D.O. ?[facility] ?[place] ?Phone: [phone] ?Subjective:   ? ?I'm seeing this patient by the request  of:  Hoyt Koch, MD ? ?CC:  ? ?[id number]  ?06/12/2021 ?Patient given injection and tolerated the procedure well.  Discussed icing regimen and home exercises, discussed which activities to do and which ones to avoid.  Increase activity slowly.  Follow-up again in 6 to 8 weeks ? ?Patient given injection today and tolerated the procedure well, discussed icing regimen and home exercises.  Differential includes lumbar radiculopathy and we will monitor.  Discussed with patient about the potential for increasing gabapentin.  Patient will consider it.  She is have enough for 200 mg twice a day and is only taking 200 mg at night.  Patient will increase activity slowly and follow-up with me again in 6 weeks ? ?Updated 07/27/2021 ?[name] is a 66 y.o. female coming in with complaint of shoulder and right hip pain ? ? ? ?  ? ?Past Medical History:  ?Diagnosis Date  ? Anemia   ? takes Ferrous Sulfate daily  ? Anxiety   ? takes Citalopram daily  ? Arthritis   ? Asthma   ? 2004-prior to gastric bypass and no problems since  ? CHF (congestive heart failure) ([place])   ? takes Lasix daily as needed  ? Chronic back pain   ? spondylolisthesis/stenosis/radiculopathy  ? Complication of anesthesia yrs ago  ? slow to wake up  ? Depression   ? Diabetes ([place])   ? DVT (deep venous thrombosis) ([place]) 01/17/2013  ? past hx. -tx.5-6 yrs ago bilateral legs, occ. sporadic swelling, has IVC filter implanted  ? Dysrhythmia   ? "heart tends to flutter"  ? Fibromyalgia   ? Fracture   ? right foot and is in a cam boot  ? Gallstones   ? GERD (gastroesophageal reflux disease)   ? hx of-no meds now  ? Heart murmur   ? History of bronchitis 2012 or 2013  ? History of colon polyps   ? Hypertension   ? takes Metoprolol daily  ? Insomnia   ? takes Melatonin daily  ? Pelvic  floor dysfunction   ? Peripheral neuropathy   ? Pneumonia 90's  ? hx of  ? S/P gastric bypass 2003  ? Sleep apnea   ? no cpap used in many yrs after weight lost-no machine now  ? Urinary frequency   ? Urinary urgency   ? Vitamin D deficiency   ? ?Past Surgical History:  ?Procedure Laterality Date  ? BALLOON DILATION N/A 01/31/2013  ? Procedure: BALLOON DILATION;  Surgeon: Arta Silence, MD;  Location: [facility];  Service: Endoscopy;  Laterality: N/A;  ? CARDIAC EVENT MONITOR  03/2019  ? Predominantly sinus rhythm.  Rates range from 48-124 bpm.  Average 74 bpm.  Frequent short bursts (3-15 beats) PAT/PSVT--not indicated as being symptomatic on diary.  Otherwise rare PACs and PVCs.  ? CHOLECYSTECTOMY  2007  ? COLONOSCOPY    ? DILATION AND CURETTAGE OF UTERUS  yrs ago  ? ESOPHAGOGASTRODUODENOSCOPY (EGD) WITH PROPOFOL  03/01/2012  ? Procedure: ESOPHAGOGASTRODUODENOSCOPY (EGD) WITH PROPOFOL;  Surgeon: Arta Silence, MD;  Location: [facility];  Service: Endoscopy;  Laterality: N/A;  ? ESOPHAGOGASTRODUODENOSCOPY (EGD) WITH PROPOFOL N/A 01/31/2013  ? Procedure: ESOPHAGOGASTRODUODENOSCOPY (EGD) WITH PROPOFOL;  Surgeon: Arta Silence, MD;  Location: [facility];  Service: Endoscopy;  Laterality: N/A;  ? ESOPHAGOGASTRODUODENOSCOPY (EGD) WITH PROPOFOL N/A 09/02/2015  ? Procedure:  ESOPHAGOGASTRODUODENOSCOPY (EGD) WITH PROPOFOL;  Surgeon: Gatha Mayer, MD;  Location: [facility];  Service: Endoscopy;  Laterality: N/A;  ? GASTRIC BY-PASS  2004  ? HERNIA REPAIR  2005  ? INSERTION OF VENA CAVA FILTER  01-17-13  ? inserted 2004- "abdomen"  ? LIGAMENT REPAIR Right 1987  ? Rt. knee scope  ? MAXIMUM ACCESS (MAS)POSTERIOR LUMBAR INTERBODY FUSION (PLIF) 2 LEVEL N/A 06/07/2014  ? Procedure: L4-5 L5-S1 FOR MAXIMUM ACCESS (MAS) POSTERIOR LUMBAR INTERBODY FUSION ;  Surgeon: Erline Levine, MD;  Location: [facility];  Service: Neurosurgery;  Laterality: N/A;  L4-5 L5-S1 FOR MAXIMUM ACCESS (MAS) POSTERIOR LUMBAR INTERBODY FUSION   ?  TONSILLECTOMY    ? as child  ? TRANSTHORACIC ECHOCARDIOGRAM  03/2019  ? EF 60 to 65%.  Mild LVH.  No R WMA.  Normal RV size.  Normal LA, mild RA dilation.  No aortic valve stenosis or sclerosis.  ? ?Social History  ? ?Socioeconomic History  ? Marital status: Widowed  ?  Spouse name: Not on file  ? Number of children: 0  ? Years of education: college  ? Highest education level: Master's degree (e.g., MA, MS, MEng, MEd, MSW, MBA)  ?Occupational History  ? Occupation: retired  ?Tobacco Use  ? Smoking status: Former  ?  Packs/day: 0.50  ?  Years: 10.00  ?  Pack years: 5.00  ?  Types: Cigarettes  ?  Quit date: 04/19/2009  ?  Years since quitting: 12.2  ? Smokeless tobacco: Never  ?Substance and Sexual Activity  ? Alcohol use: Yes  ?  Alcohol/week: 0.0 standard drinks  ?  Comment: occ. social- wine-1 drink monthly  ? Drug use: No  ? Sexual activity: Not Currently  ?Other Topics Concern  ? Not on file  ?Social History Narrative  ? Widow.  No kids.  Lives with her mother who is 27.  She does walk with a cane.  She is a retired Investment banker, corporate  ? She quit smoking in 2003  ? ?Social Determinants of Health  ? ?Financial Resource Strain: Medium Risk  ? Difficulty of Paying Living Expenses: Somewhat hard  ?Food Insecurity: Food Insecurity Present  ? Worried About Charity fundraiser in the Last Year: Sometimes true  ? Ran Out of Food in the Last Year: Sometimes true  ?Transportation Needs: Unmet Transportation Needs  ? Lack of Transportation (Medical): Yes  ? Lack of Transportation (Non-Medical): No  ?Physical Activity: Unknown  ? Days of Exercise per Week: 0 days  ? Minutes of Exercise per Session: Not on file  ?Stress: Stress Concern Present  ? Feeling of Stress : To some extent  ?Social Connections: Unknown  ? Frequency of Communication with Friends and Family: More than three times a week  ? Frequency of Social Gatherings with Friends and Family: Once a week  ? Attends Religious Services: Patient refused  ? Active Member  of Clubs or Organizations: Yes  ? Attends Archivist Meetings: 1 to 4 times per year  ? Marital Status: Widowed  ? ?Allergies  ?Allergen Reactions  ? Carafate [Sucralfate] Hives, Itching and Other (See Comments)  ?  Angioedema  ? ?  ? Sulfonamide Derivatives Rash  ?  Syncope  ? ?Family History  ?Problem Relation Age of Onset  ? Diabetes Mother   ? Atrial fibrillation Mother   ? Hypertension Mother   ? Heart disease Father   ?     Unsure of details  ? Hypertension Father   ?  Prostate cancer Father   ? Alzheimer's disease Father   ? Kidney disease Brother   ? Healthy Sister   ? Diabetes Maternal Grandmother   ? Heart disease Maternal Grandmother   ? Stroke Maternal Grandfather 50  ? Hypertension Maternal Grandfather   ? Hypertension Paternal Grandmother   ? Alzheimer's disease Paternal Grandmother   ? Colon cancer Neg Hx   ? Colon polyps Neg Hx   ? Stomach cancer Neg Hx   ? Esophageal cancer Neg Hx   ? Pancreatic cancer Neg Hx   ? Liver disease Neg Hx   ? ? ?Current Outpatient Medications (Endocrine & Metabolic):  ?  alendronate (FOSAMAX) 70 MG tablet, Take 1 tablet (70 mg total) by mouth every 7 (seven) days. Take with a full glass of water on an empty stomach. ? ?Current Outpatient Medications (Cardiovascular):  ?  amLODipine (NORVASC) 10 MG tablet, TAKE 1 TABLET EVERY DAY ? ?Current Outpatient Medications (Respiratory):  ?  albuterol (PROVENTIL) (2.5 MG/3ML) 0.083% nebulizer solution, Take 3 mLs (2.5 mg total) by nebulization every 6 (six) hours as needed for wheezing or shortness of breath. ?  albuterol (VENTOLIN HFA) 108 (90 Base) MCG/ACT inhaler, INHALE 2 PUFFS INTO THE LUNGS EVERY 6 HOURS AS NEEDED FOR WHEEZING OR SHORTNESS OF BREATH ?  fluticasone-salmeterol (ADVAIR) 100-50 MCG/ACT AEPB, Inhale 1 puff into the lungs 2 (two) times daily. ? ?Current Outpatient Medications (Analgesics):  ?  Acetaminophen-Codeine 300-30 MG tablet, Take 1 tablet by mouth every 6 (six) hours as needed for  pain. ? ? ?Current Outpatient Medications (Other):  ?  ACCU-CHEK SOFTCLIX LANCETS lancets, CHECK BLOOD SUGARS TWICE DAILY ?  Alcohol Swabs (B-D SINGLE USE SWABS REGULAR) PADS, USE AS DIRECTED EVERY DAY ?  azithromycin (ZITH

## 2021-07-23 NOTE — Assessment & Plan Note (Signed)
Rx nebulizer solution and nebulizer today as she is having difficulty getting inhaler medication in her lungs and responded much better to nebulizer at ER.  ?

## 2021-07-24 LAB — GI PROFILE, STOOL, PCR
Adenovirus F 40/41: NOT DETECTED
Astrovirus: NOT DETECTED
C difficile toxin A/B: NOT DETECTED
Campylobacter: NOT DETECTED
Cryptosporidium: DETECTED — AB
Cyclospora cayetanensis: NOT DETECTED
Entamoeba histolytica: NOT DETECTED
Enteroaggregative E coli: NOT DETECTED
Enteropathogenic E coli: NOT DETECTED
Enterotoxigenic E coli: NOT DETECTED
Giardia lamblia: NOT DETECTED
Norovirus GI/GII: NOT DETECTED
Plesiomonas shigelloides: NOT DETECTED
Rotavirus A: NOT DETECTED
Salmonella: NOT DETECTED
Sapovirus: NOT DETECTED
Shiga-toxin-producing E coli: NOT DETECTED
Shigella/Enteroinvasive E coli: NOT DETECTED
Vibrio cholerae: NOT DETECTED
Vibrio: NOT DETECTED
Yersinia enterocolitica: NOT DETECTED

## 2021-07-27 ENCOUNTER — Other Ambulatory Visit: Payer: Self-pay

## 2021-07-27 ENCOUNTER — Encounter: Payer: Self-pay | Admitting: Internal Medicine

## 2021-07-27 ENCOUNTER — Ambulatory Visit: Payer: Medicare HMO | Admitting: Family Medicine

## 2021-07-28 ENCOUNTER — Encounter: Payer: Self-pay | Admitting: Internal Medicine

## 2021-07-28 DIAGNOSIS — H43393 Other vitreous opacities, bilateral: Secondary | ICD-10-CM | POA: Diagnosis not present

## 2021-07-28 DIAGNOSIS — Z0100 Encounter for examination of eyes and vision without abnormal findings: Secondary | ICD-10-CM | POA: Diagnosis not present

## 2021-07-30 MED ORDER — NITAZOXANIDE 500 MG PO TABS: 500.0000 mg | ORAL_TABLET | Freq: Two times a day (BID) | ORAL | 0 refills | Status: AC

## 2021-08-05 DIAGNOSIS — H524 Presbyopia: Secondary | ICD-10-CM | POA: Diagnosis not present

## 2021-08-07 ENCOUNTER — Other Ambulatory Visit: Payer: Self-pay

## 2021-08-07 ENCOUNTER — Telehealth: Payer: Self-pay

## 2021-08-07 NOTE — Progress Notes (Signed)
? ? ?  Chronic Care Management ?Pharmacy Assistant  ? ?Name: [name]  MRN: [id number] DOB: 01/28/1956 ? ?Reason for Encounter: Disease State-General call ?  ? ?Recent office visits:  ?07/22/21 Crawford, Elizabeth A, MD-PCP (Diarrhea) Labs ordered and chest xray; Med changes: neomycin-polymyxin-hydrocortisone (CORTISPORIN) OTIC solution ? ?Recent consult visits:  ?None  since last coordination call ? ?Hospital visits:  ?None since last coordination call ? ?Medications: ?Outpatient Encounter Medications as of 08/07/2021  ?Medication Sig  ? ACCU-CHEK SOFTCLIX LANCETS lancets CHECK BLOOD SUGARS TWICE DAILY  ? Acetaminophen-Codeine 300-30 MG tablet Take 1 tablet by mouth every 6 (six) hours as needed for pain.  ? albuterol (PROVENTIL) (2.5 MG/3ML) 0.083% nebulizer solution Take 3 mLs (2.5 mg total) by nebulization every 6 (six) hours as needed for wheezing or shortness of breath.  ? albuterol (VENTOLIN HFA) 108 (90 Base) MCG/ACT inhaler INHALE 2 PUFFS INTO THE LUNGS EVERY 6 HOURS AS NEEDED FOR WHEEZING OR SHORTNESS OF BREATH  ? Alcohol Swabs (B-D SINGLE USE SWABS REGULAR) PADS USE AS DIRECTED EVERY DAY  ? alendronate (FOSAMAX) 70 MG tablet Take 1 tablet (70 mg total) by mouth every 7 (seven) days. Take with a full glass of water on an empty stomach.  ? amLODipine (NORVASC) 10 MG tablet TAKE 1 TABLET EVERY DAY  ? azithromycin (ZITHROMAX) 250 MG tablet Take 1 tablet (250 mg total) by mouth daily. Take first 2 tablets together, then 1 every day until finished.  ? Blood Glucose Calibration (ACCU-CHEK AVIVA) SOLN Use as directed  ? Blood Glucose Monitoring Suppl (ACCU-CHEK AVIVA PLUS) w/Device KIT USE AS DIRECTED  TO CHECK BLOOD SUGAR EVERY DAY  ? buPROPion (WELLBUTRIN XL) 300 MG 24 hr tablet Take 1 tablet (300 mg total) by mouth daily.  ? fluticasone-salmeterol (ADVAIR) 100-50 MCG/ACT AEPB Inhale 1 puff into the lungs 2 (two) times daily.  ? gabapentin (NEURONTIN) 100 MG capsule Take 2 capsules (200 mg total) by mouth  2 (two) times daily.  ? glucose blood (ACCU-CHEK AVIVA PLUS) test strip 1 each by Other route 2 (two) times daily. Use to check blood sugars twice a day  ? lipase/protease/amylase (CREON) 36000 UNITS CPEP capsule Take 1 capsule by mouth in the morning, at noon, in the evening, and at bedtime.  ? neomycin-polymyxin-hydrocortisone (CORTISPORIN) OTIC solution Place 3 drops into the right ear 3 (three) times daily.  ? ondansetron (ZOFRAN) 4 MG tablet Take 1 tablet (4 mg total) by mouth every 8 (eight) hours as needed for nausea or vomiting.  ? PARoxetine (PAXIL) 10 MG tablet TAKE 1 TABLET(10 MG) BY MOUTH DAILY  ? tobramycin (TOBREX) 0.3 % ophthalmic solution Place 2 drops into both eyes every 6 (six) hours.  ? ?No facility-administered encounter medications on file as of 08/07/2021.  ? ? Error no assessment needed this month ? ?Care Gaps: ?Colonoscopy-04/19/09 ?Diabetic Foot Exam-NA ?Mammogram-01/15/21 ?Ophthalmology-NA ?Dexa Scan - NA ?Annual Well Visit - 12/02/20 ?Micro albumin-NA ?Hemoglobin A1c- NA ?  ?Star Rating Drugs: ?None ID ?  ?Tracy Ellis,CMA ?Clinical Pharmacist Assistant ?[phone]  ?

## 2021-08-15 ENCOUNTER — Encounter: Payer: Self-pay | Admitting: Internal Medicine

## 2021-08-15 DIAGNOSIS — R197 Diarrhea, unspecified: Secondary | ICD-10-CM

## 2021-08-16 DIAGNOSIS — I10 Essential (primary) hypertension: Secondary | ICD-10-CM | POA: Diagnosis not present

## 2021-08-16 DIAGNOSIS — F32A Depression, unspecified: Secondary | ICD-10-CM

## 2021-08-17 ENCOUNTER — Other Ambulatory Visit: Payer: Medicare HMO

## 2021-08-17 HISTORY — PX: OTHER SURGICAL HISTORY: SHX169

## 2021-08-18 DIAGNOSIS — R197 Diarrhea, unspecified: Secondary | ICD-10-CM | POA: Diagnosis not present

## 2021-08-21 LAB — GI PROFILE, STOOL, PCR

## 2021-08-24 DIAGNOSIS — H6121 Impacted cerumen, right ear: Secondary | ICD-10-CM | POA: Diagnosis not present

## 2021-08-24 DIAGNOSIS — E119 Type 2 diabetes mellitus without complications: Secondary | ICD-10-CM | POA: Diagnosis not present

## 2021-08-28 ENCOUNTER — Ambulatory Visit (INDEPENDENT_AMBULATORY_CARE_PROVIDER_SITE_OTHER): Payer: Medicare HMO | Admitting: Physician Assistant

## 2021-08-28 ENCOUNTER — Encounter: Payer: Self-pay | Admitting: Physician Assistant

## 2021-08-28 ENCOUNTER — Ambulatory Visit (INDEPENDENT_AMBULATORY_CARE_PROVIDER_SITE_OTHER): Payer: Medicare HMO

## 2021-08-28 VITALS — BP 140/64 | HR 62 | Ht 67.0 in | Wt 230.8 lb

## 2021-08-28 DIAGNOSIS — E7800 Pure hypercholesterolemia, unspecified: Secondary | ICD-10-CM | POA: Diagnosis not present

## 2021-08-28 DIAGNOSIS — R002 Palpitations: Secondary | ICD-10-CM

## 2021-08-28 DIAGNOSIS — I10 Essential (primary) hypertension: Secondary | ICD-10-CM | POA: Diagnosis not present

## 2021-08-28 DIAGNOSIS — Z86718 Personal history of other venous thrombosis and embolism: Secondary | ICD-10-CM

## 2021-08-28 DIAGNOSIS — R0609 Other forms of dyspnea: Secondary | ICD-10-CM

## 2021-08-28 NOTE — Progress Notes (Unsigned)
Enrolled patient ofr a 14 day Zio XT monitor to be mailed to patients home  ? ?Dr Ellyn Hack to read ?

## 2021-08-28 NOTE — Progress Notes (Signed)
?Cardiology Office Note:   ? ?Date:  08/28/2021  ? ?ID:  [name], DOB 18-Apr-1956, MRN [id number] ? ?PCP:  Hoyt Koch, MD ?  ?[facility] Providers ?Cardiologist:  Glenetta Hew, MD ?Cardiology APP:  [place], [place] { ?Referring MD: Hoyt Koch, *  ? ?Chief Complaint  ?Patient presents with  ? Palpitations  ? ? ?History of Present Illness:   ? ?[name] is a 66 y.o. female with a hx of bilateral DVTs s/p IVC filter 2009, no longer on anticoagulation therapy, hypertension, fatigue, OSA on CPAP, and documented sedentary lifestyle.  She has a history of gastric bypass but gained a considerable amount of weight back.  She has been deconditioned in the past likely a result of morbid obesity.  Hypertension has been controlled with amlodipine.  She has followed with Dr. Ellyn Hack last seen in 2021 reporting chest tightness and shortness of breath with ambulation.  Cardiac CT 09/21/2019 with a calcium score of 117 which was 98 percentile for age and sex matched controls, minimal nonobstructive CAD.   ? ?She was last seen by Jory Sims, NP 10/15/2019 and she was awaiting her sleep study at that time.  Suspected that OSA was contributing to her dyspnea on exertion.  Echocardiogram was repeated.  Echocardiogram in September 2022 with LVEF 55 to 40%, grade 1 diastolic dysfunction, moderately dilated left atrium, mild MR, and 39 mm ascending aorta.  ? ?She had an ER visit 07/17/2021 for shortness of breath, cough and congestion, COVID-negative.  She apparently has been on multiple antibiotics and has a history of C. difficile.  She was seen by PCP on 07/22/2021 for this. ? ?She presents today for routine follow-up.  She has been very fatigued since her illness with DOE with very little activity. She has had 2 signals from her smartwatch indicating Afib last week. She is in the [employer] trial in which [employer] watches are being studied to detect Afib. She was contacted by the  study coordinator and told to see her cardiologist.  She is also having dizziness when she is exerting herself. No chest pain. She has dependent edema. She has been having palpitations for 6-7 months.  ? ?She does not have tachycardia, frank syncope, or chest pain. Low suspicion for PE.   ? ? ?Past Medical History:  ?Diagnosis Date  ? Anemia   ? takes Ferrous Sulfate daily  ? Anxiety   ? takes Citalopram daily  ? Arthritis   ? Asthma   ? 2004-prior to gastric bypass and no problems since  ? CHF (congestive heart failure) ([place])   ? takes Lasix daily as needed  ? Chronic back pain   ? spondylolisthesis/stenosis/radiculopathy  ? Complication of anesthesia yrs ago  ? slow to wake up  ? Depression   ? Diabetes ([place])   ? DVT (deep venous thrombosis) ([place]) 01/17/2013  ? past hx. -tx.5-6 yrs ago bilateral legs, occ. sporadic swelling, has IVC filter implanted  ? Dysrhythmia   ? "heart tends to flutter"  ? Fibromyalgia   ? Fracture   ? right foot and is in a cam boot  ? Gallstones   ? GERD (gastroesophageal reflux disease)   ? hx of-no meds now  ? Heart murmur   ? History of bronchitis 2012 or 2013  ? History of colon polyps   ? Hypertension   ? takes Metoprolol daily  ? Insomnia   ? takes Melatonin daily  ? Pelvic floor dysfunction   ?  Peripheral neuropathy   ? Pneumonia 90's  ? hx of  ? S/P gastric bypass 2003  ? Sleep apnea   ? no cpap used in many yrs after weight lost-no machine now  ? Urinary frequency   ? Urinary urgency   ? Vitamin D deficiency   ? ? ?Past Surgical History:  ?Procedure Laterality Date  ? BALLOON DILATION N/A 01/31/2013  ? Procedure: BALLOON DILATION;  Surgeon: Arta Silence, MD;  Location: [facility];  Service: Endoscopy;  Laterality: N/A;  ? CARDIAC EVENT MONITOR  03/2019  ? Predominantly sinus rhythm.  Rates range from 48-124 bpm.  Average 74 bpm.  Frequent short bursts (3-15 beats) PAT/PSVT--not indicated as being symptomatic on diary.  Otherwise rare PACs and PVCs.  ? CHOLECYSTECTOMY  2007  ?  COLONOSCOPY    ? DILATION AND CURETTAGE OF UTERUS  yrs ago  ? ESOPHAGOGASTRODUODENOSCOPY (EGD) WITH PROPOFOL  03/01/2012  ? Procedure: ESOPHAGOGASTRODUODENOSCOPY (EGD) WITH PROPOFOL;  Surgeon: Arta Silence, MD;  Location: [facility];  Service: Endoscopy;  Laterality: N/A;  ? ESOPHAGOGASTRODUODENOSCOPY (EGD) WITH PROPOFOL N/A 01/31/2013  ? Procedure: ESOPHAGOGASTRODUODENOSCOPY (EGD) WITH PROPOFOL;  Surgeon: Arta Silence, MD;  Location: [facility];  Service: Endoscopy;  Laterality: N/A;  ? ESOPHAGOGASTRODUODENOSCOPY (EGD) WITH PROPOFOL N/A 09/02/2015  ? Procedure: ESOPHAGOGASTRODUODENOSCOPY (EGD) WITH PROPOFOL;  Surgeon: Gatha Mayer, MD;  Location: [facility];  Service: Endoscopy;  Laterality: N/A;  ? GASTRIC BY-PASS  2004  ? HERNIA REPAIR  2005  ? INSERTION OF VENA CAVA FILTER  01-17-13  ? inserted 2004- "abdomen"  ? LIGAMENT REPAIR Right 1987  ? Rt. knee scope  ? MAXIMUM ACCESS (MAS)POSTERIOR LUMBAR INTERBODY FUSION (PLIF) 2 LEVEL N/A 06/07/2014  ? Procedure: L4-5 L5-S1 FOR MAXIMUM ACCESS (MAS) POSTERIOR LUMBAR INTERBODY FUSION ;  Surgeon: Erline Levine, MD;  Location: [facility];  Service: Neurosurgery;  Laterality: N/A;  L4-5 L5-S1 FOR MAXIMUM ACCESS (MAS) POSTERIOR LUMBAR INTERBODY FUSION   ? TONSILLECTOMY    ? as child  ? TRANSTHORACIC ECHOCARDIOGRAM  03/2019  ? EF 60 to 65%.  Mild LVH.  No R WMA.  Normal RV size.  Normal LA, mild RA dilation.  No aortic valve stenosis or sclerosis.  ? ? ?Current Medications: ?Current Meds  ?Medication Sig  ? albuterol (PROVENTIL) (2.5 MG/3ML) 0.083% nebulizer solution Take 3 mLs (2.5 mg total) by nebulization every 6 (six) hours as needed for wheezing or shortness of breath.  ? albuterol (VENTOLIN HFA) 108 (90 Base) MCG/ACT inhaler INHALE 2 PUFFS INTO THE LUNGS EVERY 6 HOURS AS NEEDED FOR WHEEZING OR SHORTNESS OF BREATH  ? alendronate (FOSAMAX) 70 MG tablet Take 1 tablet (70 mg total) by mouth every 7 (seven) days. Take with a full glass of water on an empty stomach.   ? amLODipine (NORVASC) 10 MG tablet TAKE 1 TABLET EVERY DAY  ? buPROPion (WELLBUTRIN XL) 300 MG 24 hr tablet Take 1 tablet (300 mg total) by mouth daily.  ? fluticasone-salmeterol (ADVAIR) 100-50 MCG/ACT AEPB Inhale 1 puff into the lungs 2 (two) times daily.  ? gabapentin (NEURONTIN) 100 MG capsule Take 2 capsules (200 mg total) by mouth 2 (two) times daily.  ? lipase/protease/amylase (CREON) 36000 UNITS CPEP capsule Take 1 capsule by mouth in the morning, at noon, in the evening, and at bedtime.  ? ondansetron (ZOFRAN) 4 MG tablet Take 1 tablet (4 mg total) by mouth every 8 (eight) hours as needed for nausea or vomiting.  ? PARoxetine (PAXIL) 10 MG tablet TAKE 1 TABLET(10  MG) BY MOUTH DAILY  ?  ? ?Allergies:   Carafate [sucralfate] and Sulfonamide derivatives  ? ?Social History  ? ?Socioeconomic History  ? Marital status: Widowed  ?  Spouse name: Not on file  ? Number of children: 0  ? Years of education: college  ? Highest education level: Master's degree (e.g., MA, MS, MEng, MEd, MSW, MBA)  ?Occupational History  ? Occupation: retired  ?Tobacco Use  ? Smoking status: Former  ?  Packs/day: 0.50  ?  Years: 10.00  ?  Pack years: 5.00  ?  Types: Cigarettes  ?  Quit date: 04/19/2009  ?  Years since quitting: 12.3  ? Smokeless tobacco: Never  ?Substance and Sexual Activity  ? Alcohol use: Yes  ?  Alcohol/week: 0.0 standard drinks  ?  Comment: occ. social- wine-1 drink monthly  ? Drug use: No  ? Sexual activity: Not Currently  ?Other Topics Concern  ? Not on file  ?Social History Narrative  ? Widow.  No kids.  Lives with her mother who is 50.  She does walk with a cane.  She is a retired Investment banker, corporate  ? She quit smoking in 2003  ? ?Social Determinants of Health  ? ?Financial Resource Strain: Not on file  ?Food Insecurity: Not on file  ?Transportation Needs: Not on file  ?Physical Activity: Not on file  ?Stress: Not on file  ?Social Connections: Not on file  ?  ? ?Family History: ?The patient's family history  includes Alzheimer's disease in her father and paternal grandmother; Atrial fibrillation in her mother; Diabetes in her maternal grandmother and mother; Healthy in her sister; Heart disease in her father a

## 2021-08-28 NOTE — Patient Instructions (Signed)
Medication Instructions:  ?Your physician recommends that you continue on your current medications as directed. Please refer to the Current Medication list given to you today. ? ?*If you need a refill on your cardiac medications before your next appointment, please call your pharmacy* ? ?Lab Work: ?NONE ordered at this time of appointment  ? ?If you have labs (blood work) drawn today and your tests are completely normal, you will receive your results only by: ?MyChart Message (if you have MyChart) OR ?A paper copy in the mail ?If you have any lab test that is abnormal or we need to change your treatment, we will call you to review the results. ? ?Testing/Procedures: ? ?ZIO XT- Long Term Monitor Instructions ? ?Your physician has requested you wear a ZIO patch monitor for 14 days.  ?This is a single patch monitor. [employer] supplies one patch monitor per enrollment. Additional ?stickers are not available. Please do not apply patch if you will be having a Nuclear Stress Test,  ?Echocardiogram, Cardiac CT, MRI, or Chest Xray during the period you would be wearing the  ?monitor. The patch cannot be worn during these tests. You cannot remove and re-apply the  ?ZIO XT patch monitor.  ?Your ZIO patch monitor will be mailed 3 day USPS to your address on file. It may take 3-5 days  ?to receive your monitor after you have been enrolled.  ?Once you have received your monitor, please review the enclosed instructions. Your monitor  ?has already been registered assigning a specific monitor serial # to you. ? ?Billing and Patient Assistance Program Information ? ?We have supplied [employer] with any of your insurance information on file for billing purposes. ?[employer] offers a sliding scale Patient Assistance Program for patients that do not have  ?insurance, or whose insurance does not completely cover the cost of the ZIO monitor.  ?You must apply for the Patient Assistance Program to qualify for this discounted rate.  ?To apply,  please call [employer] at [phone], select option 4, select option 2, ask to apply for  ?Patient Assistance Program. Theodore Demark will ask your household income, and how many people  ?are in your household. They will quote your out-of-pocket cost based on that information.  ?[employer] will also be able to set up a 4-month interest-free payment plan if needed. ? ?Applying the monitor ?  ?Shave hair from upper left chest.  ?Hold abrader disc by orange tab. Rub abrader in 40 strokes over the upper left chest as  ?indicated in your monitor instructions.  ?Clean area with 4 enclosed alcohol pads. Let dry.  ?Apply patch as indicated in monitor instructions. Patch will be placed under collarbone on left  ?side of chest with arrow pointing upward.  ?Rub patch adhesive wings for 2 minutes. Remove white label marked "1". Remove the white  ?label marked "2". Rub patch adhesive wings for 2 additional minutes.  ?While looking in a mirror, press and release button in center of patch. A small green light will  ?flash 3-4 times. This will be your only indicator that the monitor has been turned on.  ?Do not shower for the first 24 hours. You may shower after the first 24 hours.  ?Press the button if you feel a symptom. You will hear a small click. Record Date, Time and  ?Symptom in the Patient Logbook.  ?When you are ready to remove the patch, follow instructions on the last 2 pages of Patient  ?Logbook. Stick patch monitor onto the last page of  Patient Logbook.  ?Place Patient Logbook in the blue and white box. Use locking tab on box and tape box closed  ?securely. The blue and white box has prepaid postage on it. Please place it in the mailbox as  ?soon as possible. Your physician should have your test results approximately 7 days after the  ?monitor has been mailed back to [facility].  ?Call [facility] at [phone] if you have questions regarding  ?your ZIO XT patch monitor. Call them immediately if you see  an orange light blinking on your  ?monitor.  ?If your monitor falls off in less than 4 days, contact our Monitor department at [phone].  ?If your monitor becomes loose or falls off after 4 days call [employer] at [phone] for  ?suggestions on securing your monitor  ? ?Follow-Up: ?At [facility], you and your health needs are our priority.  As part of our continuing mission to provide you with exceptional heart care, we have created designated Provider Care Teams.  These Care Teams include your primary Cardiologist (physician) and Advanced Practice Providers (APPs -  Physician Assistants and Nurse Practitioners) who all work together to provide you with the care you need, when you need it. ?  ?Your next appointment:   ?5 week(s) ? ?The format for your next appointment:   ?In Person ? ?Provider:   ?Glenetta Hew, MD  or Fabian Sharp, PA-C      ? ? ?Other Instructions ? ? ?Important Information About Sugar ? ? ? ? ? ? ?

## 2021-09-02 ENCOUNTER — Ambulatory Visit: Payer: Medicare HMO | Admitting: Podiatry

## 2021-09-02 ENCOUNTER — Ambulatory Visit (INDEPENDENT_AMBULATORY_CARE_PROVIDER_SITE_OTHER): Payer: Medicare HMO

## 2021-09-02 DIAGNOSIS — M21619 Bunion of unspecified foot: Secondary | ICD-10-CM | POA: Diagnosis not present

## 2021-09-02 DIAGNOSIS — M7671 Peroneal tendinitis, right leg: Secondary | ICD-10-CM | POA: Diagnosis not present

## 2021-09-02 DIAGNOSIS — M21611 Bunion of right foot: Secondary | ICD-10-CM

## 2021-09-02 DIAGNOSIS — M21612 Bunion of left foot: Secondary | ICD-10-CM | POA: Diagnosis not present

## 2021-09-02 DIAGNOSIS — M7672 Peroneal tendinitis, left leg: Secondary | ICD-10-CM

## 2021-09-02 DIAGNOSIS — M216X9 Other acquired deformities of unspecified foot: Secondary | ICD-10-CM | POA: Diagnosis not present

## 2021-09-02 MED ORDER — TRIAMCINOLONE ACETONIDE 10 MG/ML IJ SUSP
20.0000 mg | Freq: Once | INTRAMUSCULAR | Status: AC
  Administered 2021-09-02: 20 mg

## 2021-09-03 DIAGNOSIS — R002 Palpitations: Secondary | ICD-10-CM

## 2021-09-03 NOTE — Progress Notes (Signed)
Subjective:   Patient ID: [name], female   DOB: 66 y.o.   MRN: [id number]   HPI Patient presents with chronic lesions on the outside of both feet with severe foot structural issues and abnormal gait and putting a lot of pressure on this portion of her foot secondary to cavus deformity.  Patient is not in great health moderate obesity was noted does not smoke likes to be active   Review of Systems  All other systems reviewed and are negative.      Objective:  Physical Exam Vitals and nursing note reviewed.  Constitutional:      Appearance: She is well-developed.  Pulmonary:     Effort: Pulmonary effort is normal.  Musculoskeletal:        General: Normal range of motion.  Skin:    General: Skin is warm.  Neurological:     Mental Status: She is alert.    Neurovascular status found to be intact muscle strength is found to be adequate range of motion within normal limits.  Patient is noted to have a significant cavus has a lot of pressure on the outside of the feet and has significant pressure on the peroneal insertion base of fifth metatarsal and also develops callus formation around the area.  Patient does have severe structural abnormalities of both feet.  Good digital perfusion well oriented x3 and flat     Assessment:  Tori peroneal tendinitis bilateral with lesions that form secondary to the pressure that is forming on his areas     Plan:  H&P x-rays reviewed and discussed.  Today I did go ahead and I did a careful sheath injections base of fifth metatarsal at the insertion peroneal bilateral right and left I debrided tissue I discussed possibility for orthotics to try to offload some weight off this area but it is difficult to be able to do this continuously.  Patient will be seen back and I do not think full foot reconstruction would be of value to her current  X-rays indicate that there is severe structural malalignment with cavus structure and significant  forefoot malalignment

## 2021-09-03 NOTE — Progress Notes (Deleted)
[place] Phone: [phone] Subjective:    I'm seeing this patient by the request  of:  Hoyt Koch, MD  CC:   [id number]  06/12/2021 Patient given injection and tolerated the procedure well.  Discussed icing regimen and home exercises, discussed which activities to do and which ones to avoid.  Increase activity slowly.  Follow-up again in 6 to 8 weeks  Patient given injection today and tolerated the procedure well, discussed icing regimen and home exercises.  Differential includes lumbar radiculopathy and we will monitor.  Discussed with patient about the potential for increasing gabapentin.  Patient will consider it.  She is have enough for 200 mg twice a day and is only taking 200 mg at night.  Patient will increase activity slowly and follow-up with me again in 6 weeks  Updated 09/04/2021 [name] is a 66 y.o. female coming in with complaint of shoulder pain right side.       Past Medical History:  Diagnosis Date   Anemia    takes Ferrous Sulfate daily   Anxiety    takes Citalopram daily   Arthritis    Asthma    2004-prior to gastric bypass and no problems since   CHF (congestive heart failure) ([place])    takes Lasix daily as needed   Chronic back pain    spondylolisthesis/stenosis/radiculopathy   Complication of anesthesia yrs ago   slow to wake up   Depression    Diabetes ([place])    DVT (deep venous thrombosis) ([place]) 01/17/2013   past hx. -tx.5-6 yrs ago bilateral legs, occ. sporadic swelling, has IVC filter implanted   Dysrhythmia    "heart tends to flutter"   Fibromyalgia    Fracture    right foot and is in a cam boot   Gallstones    GERD (gastroesophageal reflux disease)    hx of-no meds now   Heart murmur    History of bronchitis 2012 or 2013   History of colon polyps    Hypertension    takes Metoprolol daily   Insomnia    takes Melatonin daily   Pelvic floor  dysfunction    Peripheral neuropathy    Pneumonia 90's   hx of   S/P gastric bypass 2003   Sleep apnea    no cpap used in many yrs after weight lost-no machine now   Urinary frequency    Urinary urgency    Vitamin D deficiency    Past Surgical History:  Procedure Laterality Date   BALLOON DILATION N/A 01/31/2013   Procedure: BALLOON DILATION;  Surgeon: Arta Silence, MD;  Location: [facility];  Service: Endoscopy;  Laterality: N/A;   CARDIAC EVENT MONITOR  03/2019   Predominantly sinus rhythm.  Rates range from 48-124 bpm.  Average 74 bpm.  Frequent short bursts (3-15 beats) PAT/PSVT--not indicated as being symptomatic on diary.  Otherwise rare PACs and PVCs.   CHOLECYSTECTOMY  2007   COLONOSCOPY     DILATION AND CURETTAGE OF UTERUS  yrs ago   ESOPHAGOGASTRODUODENOSCOPY (EGD) WITH PROPOFOL  03/01/2012   Procedure: ESOPHAGOGASTRODUODENOSCOPY (EGD) WITH PROPOFOL;  Surgeon: Arta Silence, MD;  [facility]: [facility];  Service: Endoscopy;  Laterality: N/A;   ESOPHAGOGASTRODUODENOSCOPY (EGD) WITH PROPOFOL N/A 01/31/2013   Procedure: ESOPHAGOGASTRODUODENOSCOPY (EGD) WITH PROPOFOL;  Surgeon: Arta Silence, MD;  [facility];  Service: Endoscopy;  Laterality: N/A;   ESOPHAGOGASTRODUODENOSCOPY (EGD) WITH PROPOFOL N/A 09/02/2015   Procedure: ESOPHAGOGASTRODUODENOSCOPY (  EGD) WITH PROPOFOL;  Surgeon: Gatha Mayer, MD;  [facility]: [facility];  Service: Endoscopy;  Laterality: N/A;   GASTRIC BY-PASS  2004   HERNIA REPAIR  2005   INSERTION OF VENA CAVA FILTER  01-17-13   inserted 2004- "abdomen"   LIGAMENT REPAIR Right 1987   Rt. knee scope   MAXIMUM ACCESS (MAS)POSTERIOR LUMBAR INTERBODY FUSION (PLIF) 2 LEVEL N/A 06/07/2014   Procedure: L4-5 L5-S1 FOR MAXIMUM ACCESS (MAS) POSTERIOR LUMBAR INTERBODY FUSION ;  Surgeon: Erline Levine, MD;  Location: [facility];  Service: Neurosurgery;  Laterality: N/A;  L4-5 L5-S1 FOR MAXIMUM ACCESS (MAS) POSTERIOR LUMBAR INTERBODY FUSION     TONSILLECTOMY     as child   TRANSTHORACIC ECHOCARDIOGRAM  03/2019   EF 60 to 65%.  Mild LVH.  No R WMA.  Normal RV size.  Normal LA, mild RA dilation.  No aortic valve stenosis or sclerosis.   Social History   Socioeconomic History   Marital status: Widowed    Spouse name: Not on file   Number of children: 0   Years of education: college   Highest education level: Master's degree (e.g., MA, MS, MEng, MEd, MSW, MBA)  Occupational History   Occupation: retired  Tobacco Use   Smoking status: Former    Packs/day: 0.50    Years: 10.00    Pack years: 5.00    Types: Cigarettes    Quit date: 04/19/2009    Years since quitting: 12.3   Smokeless tobacco: Never  Substance and Sexual Activity   Alcohol use: Yes    Alcohol/week: 0.0 standard drinks    Comment: occ. social- wine-1 drink monthly   Drug use: No   Sexual activity: Not Currently  Other Topics Concern   Not on file  Social History Narrative   Widow.  No kids.  Lives with her mother who is 61.  She does walk with a cane.  She is a retired Investment banker, corporate   She quit smoking in 2003   Social Determinants of Radio broadcast assistant Strain: Not on file  Food Insecurity: Not on file  Transportation Needs: Not on file  Physical Activity: Not on file  Stress: Not on file  Social Connections: Not on file   Allergies  Allergen Reactions   Carafate [Sucralfate] Hives, Itching and Other (See Comments)    Angioedema      Sulfonamide Derivatives Rash    Syncope   Family History  Problem Relation Age of Onset   Diabetes Mother    Atrial fibrillation Mother    Hypertension Mother    Heart disease Father        Unsure of details   Hypertension Father    Prostate cancer Father    Alzheimer's disease Father    Kidney disease Brother    Healthy Sister    Diabetes Maternal Grandmother    Heart disease Maternal Grandmother    Stroke Maternal Grandfather 41   Hypertension Maternal Grandfather    Hypertension  Paternal Grandmother    Alzheimer's disease Paternal Grandmother    Colon cancer Neg Hx    Colon polyps Neg Hx    Stomach cancer Neg Hx    Esophageal cancer Neg Hx    Pancreatic cancer Neg Hx    Liver disease Neg Hx     Current Outpatient Medications (Endocrine & Metabolic):    alendronate (FOSAMAX) 70 MG tablet, Take 1 tablet (70 mg total) by mouth every 7 (seven) days. Take with a  full glass of water on an empty stomach.  Current Outpatient Medications (Cardiovascular):    amLODipine (NORVASC) 10 MG tablet, TAKE 1 TABLET EVERY DAY  Current Outpatient Medications (Respiratory):    albuterol (PROVENTIL) (2.5 MG/3ML) 0.083% nebulizer solution, Take 3 mLs (2.5 mg total) by nebulization every 6 (six) hours as needed for wheezing or shortness of breath.   albuterol (VENTOLIN HFA) 108 (90 Base) MCG/ACT inhaler, INHALE 2 PUFFS INTO THE LUNGS EVERY 6 HOURS AS NEEDED FOR WHEEZING OR SHORTNESS OF BREATH   fluticasone-salmeterol (ADVAIR) 100-50 MCG/ACT AEPB, Inhale 1 puff into the lungs 2 (two) times daily.    Current Outpatient Medications (Other):    buPROPion (WELLBUTRIN XL) 300 MG 24 hr tablet, Take 1 tablet (300 mg total) by mouth daily.   gabapentin (NEURONTIN) 100 MG capsule, Take 2 capsules (200 mg total) by mouth 2 (two) times daily.   lipase/protease/amylase (CREON) 36000 UNITS CPEP capsule, Take 1 capsule by mouth in the morning, at noon, in the evening, and at bedtime.   ondansetron (ZOFRAN) 4 MG tablet, Take 1 tablet (4 mg total) by mouth every 8 (eight) hours as needed for nausea or vomiting.   PARoxetine (PAXIL) 10 MG tablet, TAKE 1 TABLET(10 MG) BY MOUTH DAILY   Reviewed prior external information including notes and imaging from  primary care provider As well as notes that were available from care everywhere and other healthcare systems.  Past medical history, social, surgical and family history all reviewed in electronic medical record.  No pertanent information unless  stated regarding to the chief complaint.   Review of Systems:  No headache, visual changes, nausea, vomiting, diarrhea, constipation, dizziness, abdominal pain, skin rash, fevers, chills, night sweats, weight loss, swollen lymph nodes, body aches, joint swelling, chest pain, shortness of breath, mood changes. POSITIVE muscle aches  Objective  There were no vitals taken for this visit.   General: No apparent distress alert and oriented x3 mood and affect normal, dressed appropriately.  HEENT: Pupils equal, extraocular movements intact  Respiratory: Patient's speak in full sentences and does not appear short of breath  Cardiovascular: No lower extremity edema, non tender, no erythema  Gait normal with good balance and coordination.  MSK:  Non tender with full range of motion and good stability and symmetric strength and tone of shoulders, elbows, wrist, hip, knee and ankles bilaterally.     Impression and Recommendations:     The above documentation has been reviewed and is accurate and complete Delsa Sale

## 2021-09-04 ENCOUNTER — Ambulatory Visit: Payer: Medicare HMO | Admitting: Family Medicine

## 2021-09-07 NOTE — Progress Notes (Unsigned)
[place] Phone: [phone] Subjective:   [name], am serving as a scribe for Dr. Hulan Saas.  This visit occurred during the SARS-CoV-2 public health emergency.  Safety protocols were in place, including screening questions prior to the visit, additional usage of staff PPE, and extensive cleaning of exam room while observing appropriate contact time as indicated for disinfecting solutions.  I'm seeing this patient by the request  of:  Hoyt Koch, MD  CC: Right knee pain  [id number]  06/12/2021 Patient given injection and tolerated the procedure well.  Discussed icing regimen and home exercises, discussed which activities to do and which ones to avoid.  Increase activity slowly.  Follow-up again in 6 to 8 weeks  Patient given injection today and tolerated the procedure well, discussed icing regimen and home exercises.  Differential includes lumbar radiculopathy and we will monitor.  Discussed with patient about the potential for increasing gabapentin.  Patient will consider it.  She is have enough for 200 mg twice a day and is only taking 200 mg at night.  Patient will increase activity slowly and follow-up with me again in 6 weeks  Updated 09/08/2021 [name] is a 66 y.o. female coming in with complaint of R shoulder and R hip pain. Patient states that her hip and shoulder are better. R knee pain has increased over anterior aspect. Using heat, ice, and Tylenol.  Patient with some increasing instability, feels like she is having some swelling.  States symptoms fairly severe.  Starting to affect daily activities.  Does not remember any new injury though.       Past Medical History:  Diagnosis Date   Anemia    takes Ferrous Sulfate daily   Anxiety    takes Citalopram daily   Arthritis    Asthma    2004-prior to gastric bypass and [name] problems since   CHF (congestive heart  failure) ([place])    takes Lasix daily as needed   Chronic back pain    spondylolisthesis/stenosis/radiculopathy   Complication of anesthesia yrs ago   slow to wake up   Depression    Diabetes ([place])    DVT (deep venous thrombosis) ([place]) 01/17/2013   past hx. -tx.5-6 yrs ago bilateral legs, occ. sporadic swelling, has IVC filter implanted   Dysrhythmia    "heart tends to flutter"   Fibromyalgia    Fracture    right foot and is in a cam boot   Gallstones    GERD (gastroesophageal reflux disease)    hx of-[name] meds now   Heart murmur    History of bronchitis 2012 or 2013   History of colon polyps    Hypertension    takes Metoprolol daily   Insomnia    takes Melatonin daily   Pelvic floor dysfunction    Peripheral neuropathy    Pneumonia 90's   hx of   S/P gastric bypass 2003   Sleep apnea    [name] cpap used in many yrs after weight lost-[name] machine now   Urinary frequency    Urinary urgency    Vitamin D deficiency    Past Surgical History:  Procedure Laterality Date   BALLOON DILATION N/A 01/31/2013   Procedure: BALLOON DILATION;  Surgeon: Arta Silence, MD;  [facility];  Service: Endoscopy;  Laterality: N/A;   CARDIAC EVENT MONITOR  03/2019   Predominantly sinus rhythm.  Rates range from 48-124 bpm.  Average 74 bpm.  Frequent short bursts (3-15 beats) PAT/PSVT--not indicated as being symptomatic on diary.  Otherwise rare PACs and PVCs.   CHOLECYSTECTOMY  2007   COLONOSCOPY     DILATION AND CURETTAGE OF UTERUS  yrs ago   ESOPHAGOGASTRODUODENOSCOPY (EGD) WITH PROPOFOL  03/01/2012   Procedure: ESOPHAGOGASTRODUODENOSCOPY (EGD) WITH PROPOFOL;  Surgeon: Arta Silence, MD;  [facility];  Service: Endoscopy;  Laterality: N/A;   ESOPHAGOGASTRODUODENOSCOPY (EGD) WITH PROPOFOL N/A 01/31/2013   Procedure: ESOPHAGOGASTRODUODENOSCOPY (EGD) WITH PROPOFOL;  Surgeon: Arta Silence, MD;  [facility];  Service: Endoscopy;  Laterality: N/A;    ESOPHAGOGASTRODUODENOSCOPY (EGD) WITH PROPOFOL N/A 09/02/2015   Procedure: ESOPHAGOGASTRODUODENOSCOPY (EGD) WITH PROPOFOL;  Surgeon: Gatha Mayer, MD;  [facility];  Service: Endoscopy;  Laterality: N/A;   GASTRIC BY-PASS  2004   HERNIA REPAIR  2005   INSERTION OF VENA CAVA FILTER  01-17-13   inserted 2004- "abdomen"   LIGAMENT REPAIR Right 1987   Rt. knee scope   MAXIMUM ACCESS (MAS)POSTERIOR LUMBAR INTERBODY FUSION (PLIF) 2 LEVEL N/A 06/07/2014   Procedure: L4-5 L5-S1 FOR MAXIMUM ACCESS (MAS) POSTERIOR LUMBAR INTERBODY FUSION ;  Surgeon: Erline Levine, MD;  Location: [facility];  Service: Neurosurgery;  Laterality: N/A;  L4-5 L5-S1 FOR MAXIMUM ACCESS (MAS) POSTERIOR LUMBAR INTERBODY FUSION    TONSILLECTOMY     as child   TRANSTHORACIC ECHOCARDIOGRAM  03/2019   EF 60 to 65%.  Mild LVH.  [name] R WMA.  Normal RV size.  Normal LA, mild RA dilation.  [name] aortic valve stenosis or sclerosis.   Social History   Socioeconomic History   Marital status: Widowed    Spouse name: Not on file   Number of children: 0   Years of education: college   Highest education level: Master's degree (e.g., MA, MS, MEng, MEd, MSW, MBA)  Occupational History   Occupation: retired  Tobacco Use   Smoking status: Former    Packs/day: 0.50    Years: 10.00    Pack years: 5.00    Types: Cigarettes    Quit date: 04/19/2009    Years since quitting: 12.3   Smokeless tobacco: Never  Substance and Sexual Activity   Alcohol use: Yes    Alcohol/week: 0.0 standard drinks    Comment: occ. social- wine-1 drink monthly   Drug use: [name]   Sexual activity: Not Currently  Other Topics Concern   Not on file  Social History Narrative   Widow.  [name] kids.  Lives with her mother who is 69.  She does walk with a cane.  She is a retired Investment banker, corporate   She quit smoking in 2003   Social Determinants of Health   Financial Resource Strain: Not on [employer] Insecurity: Not on file  Transportation Needs: Not on  file  Physical Activity: Not on file  Stress: Not on file  Social Connections: Not on file   Allergies  Allergen Reactions   Carafate [Sucralfate] Hives, Itching and Other (See Comments)    Angioedema      Sulfonamide Derivatives Rash    Syncope   Family History  Problem Relation Age of Onset   Diabetes Mother    Atrial fibrillation Mother    Hypertension Mother    Heart disease Father        Unsure of details   Hypertension Father    Prostate cancer Father    Alzheimer's disease Father    Kidney disease Brother    Healthy  Sister    Diabetes Maternal Grandmother    Heart disease Maternal Grandmother    Stroke Maternal Grandfather 28   Hypertension Maternal Grandfather    Hypertension Paternal Grandmother    Alzheimer's disease Paternal Grandmother    Colon cancer Neg Hx    Colon polyps Neg Hx    Stomach cancer Neg Hx    Esophageal cancer Neg Hx    Pancreatic cancer Neg Hx    Liver disease Neg Hx     Current Outpatient Medications (Endocrine & Metabolic):    alendronate (FOSAMAX) 70 MG tablet, Take 1 tablet (70 mg total) by mouth every 7 (seven) days. Take with a full glass of water on an empty stomach.  Current Outpatient Medications (Cardiovascular):    amLODipine (NORVASC) 10 MG tablet, TAKE 1 TABLET EVERY DAY  Current Outpatient Medications (Respiratory):    albuterol (PROVENTIL) (2.5 MG/3ML) 0.083% nebulizer solution, Take 3 mLs (2.5 mg total) by nebulization every 6 (six) hours as needed for wheezing or shortness of breath.   albuterol (VENTOLIN HFA) 108 (90 Base) MCG/ACT inhaler, INHALE 2 PUFFS INTO THE LUNGS EVERY 6 HOURS AS NEEDED FOR WHEEZING OR SHORTNESS OF BREATH   fluticasone-salmeterol (ADVAIR) 100-50 MCG/ACT AEPB, Inhale 1 puff into the lungs 2 (two) times daily.    Current Outpatient Medications (Other):    buPROPion (WELLBUTRIN XL) 300 MG 24 hr tablet, Take 1 tablet (300 mg total) by mouth daily.   gabapentin (NEURONTIN) 100 MG capsule, Take 2  capsules (200 mg total) by mouth 2 (two) times daily.   lipase/protease/amylase (CREON) 36000 UNITS CPEP capsule, Take 1 capsule by mouth in the morning, at noon, in the evening, and at bedtime.   ondansetron (ZOFRAN) 4 MG tablet, Take 1 tablet (4 mg total) by mouth every 8 (eight) hours as needed for nausea or vomiting.   PARoxetine (PAXIL) 10 MG tablet, TAKE 1 TABLET(10 MG) BY MOUTH DAILY   Reviewed prior external information including notes and imaging from  primary care provider As well as notes that were available from care everywhere and other healthcare systems.  Past medical history, social, surgical and family history all reviewed in electronic medical record.  [name] pertanent information unless stated regarding to the chief complaint.   Review of Systems:  [name] headache, visual changes, nausea, vomiting, diarrhea, constipation, dizziness, abdominal pain, skin rash, fevers, chills, night sweats, weight loss, swollen lymph nodes, body aches, joint swelling, chest pain, shortness of breath, mood changes. POSITIVE muscle aches  Objective  Blood pressure 110/74, pulse 71, height '5\' 7"'$  (1.702 m), weight 227 lb (103 kg), SpO2 96 %.   General: [name] apparent distress alert and oriented x3 mood and affect normal, dressed appropriately.  HEENT: Pupils equal, extraocular movements intact  Respiratory: Patient's speak in full sentences and does not appear short of breath  Cardiovascular: 1+ lower extremity edema, non tender, [name] erythema hemosiderin deposits of the lower extremity Gait antalgic MSK: Right knee does have some instability noted.  Diffuse tenderness noted.  Patient does have limited flexion and extension.  Instability with valgus and varus force.   After informed written and verbal consent, patient was seated on exam table. Right knee was prepped with alcohol swab and utilizing anterolateral approach, patient's right knee space was injected with 4:1  marcaine 0.5%: Kenalog '40mg'$ /dL.  Patient tolerated the procedure well without immediate complications.   Impression and Recommendations:     The above documentation has been reviewed and is accurate and complete Lyndal Pulley, DO

## 2021-09-08 ENCOUNTER — Ambulatory Visit: Payer: Self-pay

## 2021-09-08 ENCOUNTER — Encounter: Payer: Self-pay | Admitting: Family Medicine

## 2021-09-08 ENCOUNTER — Ambulatory Visit: Payer: Medicare HMO | Admitting: Family Medicine

## 2021-09-08 VITALS — BP 110/74 | HR 71 | Ht 67.0 in | Wt 227.0 lb

## 2021-09-08 DIAGNOSIS — Z789 Other specified health status: Secondary | ICD-10-CM | POA: Diagnosis not present

## 2021-09-08 DIAGNOSIS — M25511 Pain in right shoulder: Secondary | ICD-10-CM | POA: Diagnosis not present

## 2021-09-08 DIAGNOSIS — M1711 Unilateral primary osteoarthritis, right knee: Secondary | ICD-10-CM | POA: Diagnosis not present

## 2021-09-08 NOTE — Patient Instructions (Addendum)
Injected knee Will get approval for gel See me again in 2 months

## 2021-09-08 NOTE — Assessment & Plan Note (Signed)
Patient given injection and tolerated the procedure well.  Patient is looking into a geriatrician.  We discussed potential referral for this.  Discussed icing regimen and home exercises.  Discussed that patient could be a candidate for viscosupplementation if needed.  Discussed which activities to potentially avoid and if any instability need to consider wearing the brace on a more regular basis.  Follow-up with me again in 6 to 8 weeks otherwise

## 2021-09-14 ENCOUNTER — Telehealth: Payer: Medicare HMO | Admitting: Physician Assistant

## 2021-09-14 DIAGNOSIS — J208 Acute bronchitis due to other specified organisms: Secondary | ICD-10-CM

## 2021-09-14 DIAGNOSIS — J019 Acute sinusitis, unspecified: Secondary | ICD-10-CM | POA: Diagnosis not present

## 2021-09-14 DIAGNOSIS — B9689 Other specified bacterial agents as the cause of diseases classified elsewhere: Secondary | ICD-10-CM | POA: Diagnosis not present

## 2021-09-14 MED ORDER — IPRATROPIUM BROMIDE 0.03 % NA SOLN: 2.0000 | Freq: Two times a day (BID) | NASAL | 0 refills | Status: DC

## 2021-09-14 MED ORDER — ALBUTEROL SULFATE (2.5 MG/3ML) 0.083% IN NEBU: 2.5000 mg | INHALATION_SOLUTION | Freq: Four times a day (QID) | RESPIRATORY_TRACT | 1 refills | Status: DC | PRN

## 2021-09-14 MED ORDER — BENZONATATE 100 MG PO CAPS: 100.0000 mg | ORAL_CAPSULE | Freq: Three times a day (TID) | ORAL | 0 refills | Status: DC | PRN

## 2021-09-14 MED ORDER — DOXYCYCLINE HYCLATE 100 MG PO TABS: 100.0000 mg | ORAL_TABLET | Freq: Two times a day (BID) | ORAL | 0 refills | Status: DC

## 2021-09-14 NOTE — Progress Notes (Signed)
Virtual Visit Consent   [name], you are scheduled for a virtual visit with a [facility] provider today. Just as with appointments in the office, your consent must be obtained to participate. Your consent will be active for this visit and any virtual visit you may have with one of our providers in the next 365 days. If you have a MyChart account, a copy of this consent can be sent to you electronically.  As this is a virtual visit, video technology does not allow for your provider to perform a traditional examination. This may limit your provider's ability to fully assess your condition. If your provider identifies any concerns that need to be evaluated in person or the need to arrange testing (such as labs, EKG, etc.), we will make arrangements to do so. Although advances in technology are sophisticated, we cannot ensure that it will always work on either your end or our end. If the connection with a video visit is poor, the visit may have to be switched to a telephone visit. With either a video or telephone visit, we are not always able to ensure that we have a secure connection.  By engaging in this virtual visit, you consent to the provision of healthcare and authorize for your insurance to be billed (if applicable) for the services provided during this visit. Depending on your insurance coverage, you may receive a charge related to this service.  I need to obtain your verbal consent now. Are you willing to proceed with your visit today? [name] has provided verbal consent on 09/14/2021 for a virtual visit (video or telephone). Mar Daring, PA-C  Date: 09/14/2021 12:11 PM  Virtual Visit via Video Note   I, Mar Daring, connected with  [name]  ([id number], Feb 18, 1956) on 09/14/21 at 12:45 PM EDT by a video-enabled telemedicine application and verified that I am speaking with the correct person using two identifiers.  Location: Patient: Virtual  Visit Location Patient: Home Provider: Virtual Visit Location Provider: Home Office   I discussed the limitations of evaluation and management by telemedicine and the availability of in person appointments. The patient expressed understanding and agreed to proceed.    History of Present Illness: [name] is a 66 y.o. who identifies as a female who was assigned female at birth, and is being seen today for URI symptoms.  HPI: URI  This is a new problem. The current episode started 1 to 4 weeks ago (09/04/21). The problem has been gradually worsening. Maximum temperature: subjective (thermometer batteries were old so would not read) The fever has been present for 1 to 2 days. Associated symptoms include congestion, coughing (productive), headaches, a plugged ear sensation (popping in the right ear), rhinorrhea (post nasal drainage) and sinus pain. Pertinent negatives include no diarrhea, ear pain, nausea, sore throat, vomiting or wheezing. Treatments tried: Mucinex DM, theraflu, nebulizer. The treatment provided no relief.   At home covid testing is negative.  Problems:  Patient Active Problem List   Diagnosis Date Noted   Community acquired pneumonia 07/23/2021   Greater trochanteric bursitis, right 06/12/2021   Acromioclavicular arthrosis 06/12/2021   Pes anserinus bursitis of right knee 04/24/2021   Left hip pain 03/06/2021   Vitamin D deficiency 02/04/2021   Chest pain on breathing 08/11/2020   Closed fracture of left distal radius 05/01/2020   Recurrent Clostridioides difficile infection 02/28/2020   Pyelonephritis 02/28/2020   Abnormal urine odor 08/10/2019   Gastrointestinal complaint 07/05/2019  Muscle strain of gluteal region, right, initial encounter 05/11/2019   DOE (dyspnea on exertion) 03/13/2019   Rapid heartbeat 62/13/0865   Systolic ejection murmur 78/46/9629   Leg swelling 10/09/2018   Insomnia 05/19/2018   Frequent refractory urinary tract infections  05/05/2018   Right leg pain 12/16/2017   History of deep vein thrombosis (DVT) of lower extremity 12/12/2017   Cavovarus deformity of foot, acquired, unspecified laterality 04/07/2017   S/P gastric bypass 08/18/2016   Muscle cramps 07/09/2016   Adjustment disorder with mixed anxiety and depressed mood 03/05/2016   Fatigue 03/05/2016   Cervical disc disorder with radiculopathy of cervical region 02/27/2016   Degenerative arthritis of right knee 02/27/2016   Right knee pain 02/24/2016   Left shoulder pain 02/24/2016   Routine general medical examination at a health care facility 12/12/2015   Schatzki's ring    Diarrhea of presumed infectious origin 06/01/2015   Lumbar radiculopathy 03/05/2015   Peripheral neuropathy 01/17/2015   Chest pain with moderate risk for cardiac etiology 05/21/2013   Fibromyalgia 02/21/2011   Viral URI 10/12/2007   Essential hypertension 12/04/2006   Hx of diabetes mellitus 10/20/2006   Morbid obesity ([place]) 10/20/2006   MDD (major depressive disorder) ([place]) 10/20/2006   OBSTRUCTIVE SLEEP APNEA 10/20/2006   OSTEOARTHRITIS 10/20/2006    Allergies:  Allergies  Allergen Reactions   Carafate [Sucralfate] Hives, Itching and Other (See Comments)    Angioedema      Sulfonamide Derivatives Rash    Syncope   Medications:  Current Outpatient Medications:    benzonatate (TESSALON) 100 MG capsule, Take 1 capsule (100 mg total) by mouth 3 (three) times daily as needed., Disp: 30 capsule, Rfl: 0   doxycycline (VIBRA-TABS) 100 MG tablet, Take 1 tablet (100 mg total) by mouth 2 (two) times daily., Disp: 20 tablet, Rfl: 0   ipratropium (ATROVENT) 0.03 % nasal spray, Place 2 sprays into both nostrils every 12 (twelve) hours. X 5-7 days, Disp: 30 mL, Rfl: 0   albuterol (PROVENTIL) (2.5 MG/3ML) 0.083% nebulizer solution, Take 3 mLs (2.5 mg total) by nebulization every 6 (six) hours as needed for wheezing or shortness of breath., Disp: 100 mL, Rfl: 1   albuterol (VENTOLIN  HFA) 108 (90 Base) MCG/ACT inhaler, INHALE 2 PUFFS INTO THE LUNGS EVERY 6 HOURS AS NEEDED FOR WHEEZING OR SHORTNESS OF BREATH, Disp: 3 each, Rfl: 2   alendronate (FOSAMAX) 70 MG tablet, Take 1 tablet (70 mg total) by mouth every 7 (seven) days. Take with a full glass of water on an empty stomach., Disp: 12 tablet, Rfl: 3   amLODipine (NORVASC) 10 MG tablet, TAKE 1 TABLET EVERY DAY, Disp: 90 tablet, Rfl: 3   buPROPion (WELLBUTRIN XL) 300 MG 24 hr tablet, Take 1 tablet (300 mg total) by mouth daily., Disp: 90 tablet, Rfl: 3   fluticasone-salmeterol (ADVAIR) 100-50 MCG/ACT AEPB, Inhale 1 puff into the lungs 2 (two) times daily., Disp: 1 each, Rfl: 3   gabapentin (NEURONTIN) 100 MG capsule, Take 2 capsules (200 mg total) by mouth 2 (two) times daily., Disp: 180 capsule, Rfl: 0   lipase/protease/amylase (CREON) 36000 UNITS CPEP capsule, Take 1 capsule by mouth in the morning, at noon, in the evening, and at bedtime., Disp: , Rfl:    ondansetron (ZOFRAN) 4 MG tablet, Take 1 tablet (4 mg total) by mouth every 8 (eight) hours as needed for nausea or vomiting., Disp: 20 tablet, Rfl: 0   PARoxetine (PAXIL) 10 MG tablet, TAKE 1 TABLET(10 MG) BY MOUTH  DAILY, Disp: 90 tablet, Rfl: 0  Observations/Objective: Patient is well-developed, well-nourished in no acute distress.  Resting comfortably at home.  Head is normocephalic, atraumatic.  No labored breathing.  Speech is clear and coherent with logical content.  Patient is alert and oriented at baseline.    Assessment and Plan: 1. Acute bacterial bronchitis - doxycycline (VIBRA-TABS) 100 MG tablet; Take 1 tablet (100 mg total) by mouth 2 (two) times daily.  Dispense: 20 tablet; Refill: 0 - benzonatate (TESSALON) 100 MG capsule; Take 1 capsule (100 mg total) by mouth 3 (three) times daily as needed.  Dispense: 30 capsule; Refill: 0 - ipratropium (ATROVENT) 0.03 % nasal spray; Place 2 sprays into both nostrils every 12 (twelve) hours. X 5-7 days  Dispense: 30  mL; Refill: 0 - albuterol (PROVENTIL) (2.5 MG/3ML) 0.083% nebulizer solution; Take 3 mLs (2.5 mg total) by nebulization every 6 (six) hours as needed for wheezing or shortness of breath.  Dispense: 100 mL; Refill: 1  2. Acute bacterial sinusitis - doxycycline (VIBRA-TABS) 100 MG tablet; Take 1 tablet (100 mg total) by mouth 2 (two) times daily.  Dispense: 20 tablet; Refill: 0 - ipratropium (ATROVENT) 0.03 % nasal spray; Place 2 sprays into both nostrils every 12 (twelve) hours. X 5-7 days  Dispense: 30 mL; Refill: 0  - Worsening over a week despite OTC medications - Will treat with Doxycycline, Ipratropium, Albuterol (neb) and tessalon perles - Can continue Mucinex  - Push fluids.  - Rest.  - Steam and humidifier can help - Seek in person evaluation if worsening or symptoms fail to improve    Follow Up Instructions: I discussed the assessment and treatment plan with the patient. The patient was provided an opportunity to ask questions and all were answered. The patient agreed with the plan and demonstrated an understanding of the instructions.  A copy of instructions were sent to the patient via MyChart unless otherwise noted below.    The patient was advised to call back or seek an in-person evaluation if the symptoms worsen or if the condition fails to improve as anticipated.  Time:  I spent 14 minutes with the patient via telehealth technology discussing the above problems/concerns.    Mar Daring, PA-C

## 2021-09-14 NOTE — Patient Instructions (Signed)
[name], thank you for joining Mar Daring, PA-C for today's virtual visit.  While this provider is not your primary care provider (PCP), if your PCP is located in our provider database this encounter information will be shared with them immediately following your visit.  Consent: (Patient) [name] provided verbal consent for this virtual visit at the beginning of the encounter.  Current Medications:  Current Outpatient Medications:    benzonatate (TESSALON) 100 MG capsule, Take 1 capsule (100 mg total) by mouth 3 (three) times daily as needed., Disp: 30 capsule, Rfl: 0   doxycycline (VIBRA-TABS) 100 MG tablet, Take 1 tablet (100 mg total) by mouth 2 (two) times daily., Disp: 20 tablet, Rfl: 0   ipratropium (ATROVENT) 0.03 % nasal spray, Place 2 sprays into both nostrils every 12 (twelve) hours. X 5-7 days, Disp: 30 mL, Rfl: 0   albuterol (PROVENTIL) (2.5 MG/3ML) 0.083% nebulizer solution, Take 3 mLs (2.5 mg total) by nebulization every 6 (six) hours as needed for wheezing or shortness of breath., Disp: 100 mL, Rfl: 1   albuterol (VENTOLIN HFA) 108 (90 Base) MCG/ACT inhaler, INHALE 2 PUFFS INTO THE LUNGS EVERY 6 HOURS AS NEEDED FOR WHEEZING OR SHORTNESS OF BREATH, Disp: 3 each, Rfl: 2   alendronate (FOSAMAX) 70 MG tablet, Take 1 tablet (70 mg total) by mouth every 7 (seven) days. Take with a full glass of water on an empty stomach., Disp: 12 tablet, Rfl: 3   amLODipine (NORVASC) 10 MG tablet, TAKE 1 TABLET EVERY DAY, Disp: 90 tablet, Rfl: 3   buPROPion (WELLBUTRIN XL) 300 MG 24 hr tablet, Take 1 tablet (300 mg total) by mouth daily., Disp: 90 tablet, Rfl: 3   fluticasone-salmeterol (ADVAIR) 100-50 MCG/ACT AEPB, Inhale 1 puff into the lungs 2 (two) times daily., Disp: 1 each, Rfl: 3   gabapentin (NEURONTIN) 100 MG capsule, Take 2 capsules (200 mg total) by mouth 2 (two) times daily., Disp: 180 capsule, Rfl: 0   lipase/protease/amylase (CREON) 36000 UNITS CPEP  capsule, Take 1 capsule by mouth in the morning, at noon, in the evening, and at bedtime., Disp: , Rfl:    ondansetron (ZOFRAN) 4 MG tablet, Take 1 tablet (4 mg total) by mouth every 8 (eight) hours as needed for nausea or vomiting., Disp: 20 tablet, Rfl: 0   PARoxetine (PAXIL) 10 MG tablet, TAKE 1 TABLET(10 MG) BY MOUTH DAILY, Disp: 90 tablet, Rfl: 0   Medications ordered in this encounter:  Meds ordered this encounter  Medications   doxycycline (VIBRA-TABS) 100 MG tablet    Sig: Take 1 tablet (100 mg total) by mouth 2 (two) times daily.    Dispense:  20 tablet    Refill:  0    Order Specific Question:   Supervising Provider    Answer:   MILLER, BRIAN [[id number]]   benzonatate (TESSALON) 100 MG capsule    Sig: Take 1 capsule (100 mg total) by mouth 3 (three) times daily as needed.    Dispense:  30 capsule    Refill:  0    Order Specific Question:   Supervising Provider    Answer:   MILLER, BRIAN [[id number]]   ipratropium (ATROVENT) 0.03 % nasal spray    Sig: Place 2 sprays into both nostrils every 12 (twelve) hours. X 5-7 days    Dispense:  30 mL    Refill:  0    Order Specific Question:   Supervising Provider    Answer:   Noemi Chapel [[id number]]  albuterol (PROVENTIL) (2.5 MG/3ML) 0.083% nebulizer solution    Sig: Take 3 mLs (2.5 mg total) by nebulization every 6 (six) hours as needed for wheezing or shortness of breath.    Dispense:  100 mL    Refill:  1    Order Specific Question:   Supervising Provider    Answer:   Sabra Heck, [place]     *If you need refills on other medications prior to your next appointment, please contact your pharmacy*  Follow-Up: Call back or seek an in-person evaluation if the symptoms worsen or if the condition fails to improve as anticipated.  Other Instructions Acute Bronchitis, Adult  Acute bronchitis is sudden inflammation of the main airways (bronchi) that come off the windpipe (trachea) in the lungs. The swelling causes the airways to get smaller and  make more mucus than normal. This can make it hard to breathe and can cause coughing or noisy breathing (wheezing). Acute bronchitis may last several weeks. The cough may last longer. Allergies, asthma, and exposure to smoke may make the condition worse. What are the causes? This condition can be caused by germs and by substances that irritate the lungs, including: Cold and flu viruses. The most common cause of this condition is the virus that causes the common cold. Bacteria. This is less common. Breathing in substances that irritate the lungs, including: Smoke from cigarettes and other forms of tobacco. Dust and pollen. Fumes from household cleaning products, gases, or burned fuel. Indoor or outdoor air pollution. What increases the risk? The following factors may make you more likely to develop this condition: A weak body's defense system, also called the immune system. A condition that affects your lungs and breathing, such as asthma. What are the signs or symptoms? Common symptoms of this condition include: Coughing. This may bring up clear, yellow, or green mucus from your lungs (sputum). Wheezing. Runny or stuffy nose. Having too much mucus in your lungs (chest congestion). Shortness of breath. Aches and pains, including sore throat or chest. How is this diagnosed? This condition is usually diagnosed based on: Your symptoms and medical history. A physical exam. You may also have other tests, including tests to rule out other conditions, such as pneumonia. These tests include: A test of lung function. Test of a mucus sample to look for the presence of bacteria. Tests to check the oxygen level in your blood. Blood tests. Chest X-ray. How is this treated? Most cases of acute bronchitis clear up over time without treatment. Your health care provider may recommend: Drinking more fluids to help thin your mucus so it is easier to cough up. Taking inhaled medicine (inhaler) to  improve air flow in and out of your lungs. Using a vaporizer or a humidifier. These are machines that add water to the air to help you breathe better. Taking a medicine that thins mucus and clears congestion (expectorant). Taking a medicine that prevents or stops coughing (cough suppressant). It is notcommon to take an antibiotic medicine for this condition. Follow these instructions at home:  Take over-the-counter and prescription medicines only as told by your health care provider. Use an inhaler, vaporizer, or humidifier as told by your health care provider. Take two teaspoons (10 mL) of honey at bedtime to lessen coughing at night. Drink enough fluid to keep your urine pale yellow. Do not use any products that contain nicotine or tobacco. These products include cigarettes, chewing tobacco, and vaping devices, such as e-cigarettes. If you need help quitting, ask  your health care provider. Get plenty of rest. Return to your normal activities as told by your health care provider. Ask your health care provider what activities are safe for you. Keep all follow-up visits. This is important. How is this prevented? To lower your risk of getting this condition again: Wash your hands often with soap and water for at least 20 seconds. If soap and water are not available, use hand sanitizer. Avoid contact with people who have cold symptoms. Try not to touch your mouth, nose, or eyes with your hands. Avoid breathing in smoke or chemical fumes. Breathing smoke or chemical fumes will make your condition worse. Get the flu shot every year. Contact a health care provider if: Your symptoms do not improve after 2 weeks. You have trouble coughing up the mucus. Your cough keeps you awake at night. You have a fever. Get help right away if you: Cough up blood. Feel pain in your chest. Have severe shortness of breath. Faint or keep feeling like you are going to faint. Have a severe headache. Have a  fever or chills that get worse. These symptoms may represent a serious problem that is an emergency. Do not wait to see if the symptoms will go away. Get medical help right away. Call your local emergency services (911 in the [place] Do not drive yourself to the hospital. Summary Acute bronchitis is inflammation of the main airways (bronchi) that come off the windpipe (trachea) in the lungs. The swelling causes the airways to get smaller and make more mucus than normal. Drinking more fluids can help thin your mucus so it is easier to cough up. Take over-the-counter and prescription medicines only as told by your health care provider. Do not use any products that contain nicotine or tobacco. These products include cigarettes, chewing tobacco, and vaping devices, such as e-cigarettes. If you need help quitting, ask your health care provider. Contact a health care provider if your symptoms do not improve after 2 weeks. This information is not intended to replace advice given to you by your health care provider. Make sure you discuss any questions you have with your health care provider. Document Revised: 08/06/2020 Document Reviewed: 08/06/2020 Elsevier Patient Education  [place].    If you have been instructed to have an in-person evaluation today at a local Urgent Care facility, please use the link below. It will take you to a list of all of our available [facility], including address, phone number and hours of operation. Please do not delay care.  [facility]  If you or a family member do not have a primary care provider, use the link below to schedule a visit and establish care. When you choose a [facility] primary care physician or advanced practice provider, you gain a long-term partner in health. Find a Primary Care Provider  Learn more about [facility] in-office and virtual care options: [facility]

## 2021-09-22 ENCOUNTER — Other Ambulatory Visit: Payer: Self-pay

## 2021-09-22 ENCOUNTER — Encounter: Payer: Self-pay | Admitting: Family Medicine

## 2021-09-22 DIAGNOSIS — R002 Palpitations: Secondary | ICD-10-CM | POA: Diagnosis not present

## 2021-09-22 DIAGNOSIS — Z0189 Encounter for other specified special examinations: Secondary | ICD-10-CM

## 2021-09-29 ENCOUNTER — Ambulatory Visit (INDEPENDENT_AMBULATORY_CARE_PROVIDER_SITE_OTHER): Payer: Medicare HMO

## 2021-09-29 DIAGNOSIS — Z0000 Encounter for general adult medical examination without abnormal findings: Secondary | ICD-10-CM

## 2021-09-29 NOTE — Patient Instructions (Signed)
[name] , Thank you for taking time to come for your Medicare Wellness Visit. I appreciate your ongoing commitment to your health goals. Please review the following plan we discussed and let me know if I can assist you in the future.   Screening recommendations/referrals: Colonoscopy: 05/19/2020; due every 5 years Mammogram: 01/15/2021; due every year Bone Density: 04/02/2021; due every 2-3 years Recommended yearly ophthalmology/optometry visit for glaucoma screening and checkup Recommended yearly dental visit for hygiene and checkup  Vaccinations: Influenza vaccine: due Fall Season 2023 Pneumococcal vaccine: 01/02/2019, 03/05/2021 Tdap vaccine: 08/07/2014; due every 10 years Shingles vaccine: 10/04/2019   Covid-19: 05/13/2019, 06/10/2019, 02/12/2020, 08/12/2020, 12/26/2020  Advanced directives: Yes; Please bring a copy of your health care power of attorney and living will to the office at your convenience.  Conditions/risks identified: Yes  Next appointment: Please schedule your next Medicare Wellness Visit with your Nurse Health Advisor in 1 year by calling [phone].   Preventive Care 47 Years and Older, Female Preventive care refers to lifestyle choices and visits with your health care provider that can promote health and wellness. What does preventive care include? A yearly physical exam. This is also called an annual well check. Dental exams once or twice a year. Routine eye exams. Ask your health care provider how often you should have your eyes checked. Personal lifestyle choices, including: Daily care of your teeth and gums. Regular physical activity. Eating a healthy diet. Avoiding tobacco and drug use. Limiting alcohol use. Practicing safe sex. Taking low-dose aspirin every day. Taking vitamin and mineral supplements as recommended by your health care provider. What happens during an annual well check? The services and screenings done by your health care provider  during your annual well check will depend on your age, overall health, lifestyle risk factors, and family history of disease. Counseling  Your health care provider may ask you questions about your: Alcohol use. Tobacco use. Drug use. Emotional well-being. Home and relationship well-being. Sexual activity. Eating habits. History of falls. Memory and ability to understand (cognition). Work and work Statistician. Reproductive health. Screening  You may have the following tests or measurements: Height, weight, and BMI. Blood pressure. Lipid and cholesterol levels. These may be checked every 5 years, or more frequently if you are over 47 years old. Skin check. Lung cancer screening. You may have this screening every year starting at age 52 if you have a 30-pack-year history of smoking and currently smoke or have quit within the past 15 years. Fecal occult blood test (FOBT) of the stool. You may have this test every year starting at age 41. Flexible sigmoidoscopy or colonoscopy. You may have a sigmoidoscopy every 5 years or a colonoscopy every 10 years starting at age 29. Hepatitis C blood test. Hepatitis B blood test. Sexually transmitted disease (STD) testing. Diabetes screening. This is done by checking your blood sugar (glucose) after you have not eaten for a while (fasting). You may have this done every 1-3 years. Bone density scan. This is done to screen for osteoporosis. You may have this done starting at age 62. Mammogram. This may be done every 1-2 years. Talk to your health care provider about how often you should have regular mammograms. Talk with your health care provider about your test results, treatment options, and if necessary, the need for more tests. Vaccines  Your health care provider may recommend certain vaccines, such as: Influenza vaccine. This is recommended every year. Tetanus, diphtheria, and acellular pertussis (Tdap, Td) vaccine. You may  need a Td booster every  10 years. Zoster vaccine. You may need this after age 84. Pneumococcal 13-valent conjugate (PCV13) vaccine. One dose is recommended after age 67. Pneumococcal polysaccharide (PPSV23) vaccine. One dose is recommended after age 51. Talk to your health care provider about which screenings and vaccines you need and how often you need them. This information is not intended to replace advice given to you by your health care provider. Make sure you discuss any questions you have with your health care provider. Document Released: 05/02/2015 Document Revised: 12/24/2015 Document Reviewed: 02/04/2015 Elsevier Interactive Patient Education  2017 [place] Prevention in the Home Falls can cause injuries. They can happen to people of all ages. There are many things you can do to make your home safe and to help prevent falls. What can I do on the outside of my home? Regularly fix the edges of walkways and driveways and fix any cracks. Remove anything that might make you trip as you walk through a door, such as a raised step or threshold. Trim any bushes or trees on the path to your home. Use bright outdoor lighting. Clear any walking paths of anything that might make someone trip, such as rocks or tools. Regularly check to see if handrails are loose or broken. Make sure that both sides of any steps have handrails. Any raised decks and porches should have guardrails on the edges. Have any leaves, snow, or ice cleared regularly. Use sand or salt on walking paths during winter. Clean up any spills in your garage right away. This includes oil or grease spills. What can I do in the bathroom? Use night lights. Install grab bars by the toilet and in the tub and shower. Do not use towel bars as grab bars. Use non-skid mats or decals in the tub or shower. If you need to sit down in the shower, use a plastic, non-slip stool. Keep the floor dry. Clean up any water that spills on the floor as soon as it  happens. Remove soap buildup in the tub or shower regularly. Attach bath mats securely with double-sided non-slip rug tape. Do not have throw rugs and other things on the floor that can make you trip. What can I do in the bedroom? Use night lights. Make sure that you have a light by your bed that is easy to reach. Do not use any sheets or blankets that are too big for your bed. They should not hang down onto the floor. Have a firm chair that has side arms. You can use this for support while you get dressed. Do not have throw rugs and other things on the floor that can make you trip. What can I do in the kitchen? Clean up any spills right away. Avoid walking on wet floors. Keep items that you use a lot in easy-to-reach places. If you need to reach something above you, use a strong step stool that has a grab bar. Keep electrical cords out of the way. Do not use floor polish or wax that makes floors slippery. If you must use wax, use non-skid floor wax. Do not have throw rugs and other things on the floor that can make you trip. What can I do with my stairs? Do not leave any items on the stairs. Make sure that there are handrails on both sides of the stairs and use them. Fix handrails that are broken or loose. Make sure that handrails are as long as the stairways.  Check any carpeting to make sure that it is firmly attached to the stairs. Fix any carpet that is loose or worn. Avoid having throw rugs at the top or bottom of the stairs. If you do have throw rugs, attach them to the floor with carpet tape. Make sure that you have a light switch at the top of the stairs and the bottom of the stairs. If you do not have them, ask someone to add them for you. What else can I do to help prevent falls? Wear shoes that: Do not have high heels. Have rubber bottoms. Are comfortable and fit you well. Are closed at the toe. Do not wear sandals. If you use a stepladder: Make sure that it is fully opened.  Do not climb a closed stepladder. Make sure that both sides of the stepladder are locked into place. Ask someone to hold it for you, if possible. Clearly mark and make sure that you can see: Any grab bars or handrails. First and last steps. Where the edge of each step is. Use tools that help you move around (mobility aids) if they are needed. These include: Canes. Walkers. Scooters. Crutches. Turn on the lights when you go into a dark area. Replace any light bulbs as soon as they burn out. Set up your furniture so you have a clear path. Avoid moving your furniture around. If any of your floors are uneven, fix them. If there are any pets around you, be aware of where they are. Review your medicines with your doctor. Some medicines can make you feel dizzy. This can increase your chance of falling. Ask your doctor what other things that you can do to help prevent falls. This information is not intended to replace advice given to you by your health care provider. Make sure you discuss any questions you have with your health care provider. Document Released: 01/30/2009 Document Revised: 09/11/2015 Document Reviewed: 05/10/2014 Elsevier Interactive Patient Education  2017 [employer].

## 2021-09-29 NOTE — Progress Notes (Signed)
I connected with [name] today by telephone and verified that I am speaking with the correct person using two identifiers. Location patient: home Location provider: work Persons participating in the virtual visit: patient, provider.   I discussed the limitations, risks, security and privacy concerns of performing an evaluation and management service by telephone and the availability of in person appointments. I also discussed with the patient that there may be a patient responsible charge related to this service. The patient expressed understanding and verbally consented to this telephonic visit.    Interactive audio and video telecommunications were attempted between this provider and patient, however failed, due to patient having technical difficulties OR patient did not have access to video capability.  We continued and completed visit with audio only.  Some vital signs may be absent or patient reported.   Time Spent with patient on telephone encounter: 30 minutes  Subjective:   [name] is a 66 y.o. female who presents for Medicare Annual (Subsequent) preventive examination.  Review of Systems     Cardiac Risk Factors include: advanced age (>48mn, >>28women);family history of premature cardiovascular disease;hypertension;obesity (BMI >30kg/m2)     Objective:    There were no vitals filed for this visit. There is no height or weight on file to calculate BMI.     09/29/2021    3:37 PM 01/20/2021    3:24 PM 02/22/2020    1:54 PM 06/15/2018    4:20 PM 12/19/2016    1:22 AM 04/26/2016    2:37 PM 09/02/2015    3:51 PM  Advanced Directives  Does Patient Have a Medical Advance Directive? Yes Yes No No No Yes Yes  Type of Advance Directive Living will;Healthcare Power of [place]    [place]  Does patient want to make changes to medical advance directive?  No - Patient declined No - Patient declined       Copy of [place]? No - copy requested Yes - validated most recent copy scanned in chart (See row information)    Yes Yes  Would patient like information on creating a medical advance directive?   No - Patient declined No - Patient declined No - Patient declined      Current Medications (verified) Outpatient Encounter Medications as of 09/29/2021  Medication Sig   albuterol (PROVENTIL) (2.5 MG/3ML) 0.083% nebulizer solution Take 3 mLs (2.5 mg total) by nebulization every 6 (six) hours as needed for wheezing or shortness of breath.   albuterol (VENTOLIN HFA) 108 (90 Base) MCG/ACT inhaler INHALE 2 PUFFS INTO THE LUNGS EVERY 6 HOURS AS NEEDED FOR WHEEZING OR SHORTNESS OF BREATH   alendronate (FOSAMAX) 70 MG tablet Take 1 tablet (70 mg total) by mouth every 7 (seven) days. Take with a full glass of water on an empty stomach.   amLODipine (NORVASC) 10 MG tablet TAKE 1 TABLET EVERY DAY   benzonatate (TESSALON) 100 MG capsule Take 1 capsule (100 mg total) by mouth 3 (three) times daily as needed.   buPROPion (WELLBUTRIN XL) 300 MG 24 hr tablet Take 1 tablet (300 mg total) by mouth daily.   doxycycline (VIBRA-TABS) 100 MG tablet Take 1 tablet (100 mg total) by mouth 2 (two) times daily.   fluticasone-salmeterol (ADVAIR) 100-50 MCG/ACT AEPB Inhale 1 puff into the lungs 2 (two) times daily.   gabapentin (NEURONTIN) 100 MG capsule Take 2 capsules (200 mg total)  by mouth 2 (two) times daily.   ipratropium (ATROVENT) 0.03 % nasal spray Place 2 sprays into both nostrils every 12 (twelve) hours. X 5-7 days   lipase/protease/amylase (CREON) 36000 UNITS CPEP capsule Take 1 capsule by mouth in the morning, at noon, in the evening, and at bedtime.   ondansetron (ZOFRAN) 4 MG tablet Take 1 tablet (4 mg total) by mouth every 8 (eight) hours as needed for nausea or vomiting.   PARoxetine (PAXIL) 10 MG tablet TAKE 1 TABLET(10 MG) BY MOUTH  DAILY   No facility-administered encounter medications on file as of 09/29/2021.    Allergies (verified) Carafate [sucralfate] and Sulfonamide derivatives   History: Past Medical History:  Diagnosis Date   Anemia    takes Ferrous Sulfate daily   Anxiety    takes Citalopram daily   Arthritis    Asthma    2004-prior to gastric bypass and no problems since   CHF (congestive heart failure) ([place])    takes Lasix daily as needed   Chronic back pain    spondylolisthesis/stenosis/radiculopathy   Complication of anesthesia yrs ago   slow to wake up   Depression    Diabetes ([place])    DVT (deep venous thrombosis) ([place]) 01/17/2013   past hx. -tx.5-6 yrs ago bilateral legs, occ. sporadic swelling, has IVC filter implanted   Dysrhythmia    "heart tends to flutter"   Fibromyalgia    Fracture    right foot and is in a cam boot   Gallstones    GERD (gastroesophageal reflux disease)    hx of-no meds now   Heart murmur    History of bronchitis 2012 or 2013   History of colon polyps    Hypertension    takes Metoprolol daily   Insomnia    takes Melatonin daily   Pelvic floor dysfunction    Peripheral neuropathy    Pneumonia 90's   hx of   S/P gastric bypass 2003   Sleep apnea    no cpap used in many yrs after weight lost-no machine now   Urinary frequency    Urinary urgency    Vitamin D deficiency    Past Surgical History:  Procedure Laterality Date   BALLOON DILATION N/A 01/31/2013   Procedure: BALLOON DILATION;  Surgeon: Arta Silence, MD;  Location: [facility];  Service: Endoscopy;  Laterality: N/A;   CARDIAC EVENT MONITOR  03/2019   Predominantly sinus rhythm.  Rates range from 48-124 bpm.  Average 74 bpm.  Frequent short bursts (3-15 beats) PAT/PSVT--not indicated as being symptomatic on diary.  Otherwise rare PACs and PVCs.   CHOLECYSTECTOMY  2007   COLONOSCOPY     DILATION AND CURETTAGE OF UTERUS  yrs ago   ESOPHAGOGASTRODUODENOSCOPY (EGD) WITH PROPOFOL  03/01/2012    Procedure: ESOPHAGOGASTRODUODENOSCOPY (EGD) WITH PROPOFOL;  Surgeon: Arta Silence, MD;  Location: [facility];  Service: Endoscopy;  Laterality: N/A;   ESOPHAGOGASTRODUODENOSCOPY (EGD) WITH PROPOFOL N/A 01/31/2013   Procedure: ESOPHAGOGASTRODUODENOSCOPY (EGD) WITH PROPOFOL;  Surgeon: Arta Silence, MD;  Location: [facility];  Service: Endoscopy;  Laterality: N/A;   ESOPHAGOGASTRODUODENOSCOPY (EGD) WITH PROPOFOL N/A 09/02/2015   Procedure: ESOPHAGOGASTRODUODENOSCOPY (EGD) WITH PROPOFOL;  Surgeon: Gatha Mayer, MD;  Location: [facility];  Service: Endoscopy;  Laterality: N/A;   GASTRIC BY-PASS  2004   HERNIA REPAIR  2005   INSERTION OF VENA CAVA FILTER  01-17-13   inserted 2004- "abdomen"   LIGAMENT REPAIR Right 1987   Rt. knee scope   MAXIMUM ACCESS (MAS)POSTERIOR  LUMBAR INTERBODY FUSION (PLIF) 2 LEVEL N/A 06/07/2014   Procedure: L4-5 L5-S1 FOR MAXIMUM ACCESS (MAS) POSTERIOR LUMBAR INTERBODY FUSION ;  Surgeon: Erline Levine, MD;  Location: [facility];  Service: Neurosurgery;  Laterality: N/A;  L4-5 L5-S1 FOR MAXIMUM ACCESS (MAS) POSTERIOR LUMBAR INTERBODY FUSION    TONSILLECTOMY     as child   TRANSTHORACIC ECHOCARDIOGRAM  03/2019   EF 60 to 65%.  Mild LVH.  No R WMA.  Normal RV size.  Normal LA, mild RA dilation.  No aortic valve stenosis or sclerosis.   Family History  Problem Relation Age of Onset   Diabetes Mother    Atrial fibrillation Mother    Hypertension Mother    Heart disease Father        Unsure of details   Hypertension Father    Prostate cancer Father    Alzheimer's disease Father    Kidney disease Brother    Healthy Sister    Diabetes Maternal Grandmother    Heart disease Maternal Grandmother    Stroke Maternal Grandfather 50   Hypertension Maternal Grandfather    Hypertension Paternal Grandmother    Alzheimer's disease Paternal Grandmother    Colon cancer Neg Hx    Colon polyps Neg Hx    Stomach cancer Neg Hx    Esophageal cancer Neg Hx    Pancreatic  cancer Neg Hx    Liver disease Neg Hx    Social History   Socioeconomic History   Marital status: Widowed    Spouse name: Not on file   Number of children: 0   Years of education: college   Highest education level: Master's degree (e.g., MA, MS, MEng, MEd, MSW, MBA)  Occupational History   Occupation: retired  Tobacco Use   Smoking status: Former    Packs/day: 0.50    Years: 10.00    Total pack years: 5.00    Types: Cigarettes    Quit date: 04/19/2009    Years since quitting: 12.4   Smokeless tobacco: Never  Substance and Sexual Activity   Alcohol use: Yes    Alcohol/week: 0.0 standard drinks of alcohol    Comment: occ. social- wine-1 drink monthly   Drug use: No   Sexual activity: Not Currently  Other Topics Concern   Not on file  Social History Narrative   Widow.  No kids.  Lives with her mother who is 95.  She does walk with a cane.  She is a retired Investment banker, corporate   She quit smoking in 2003   Social Determinants of Health   Financial Resource Strain: Low Risk  (09/29/2021)   Overall Financial Resource Strain (CARDIA)    Difficulty of Paying Living Expenses: Not hard at all  Food Insecurity: No Food Insecurity (09/29/2021)   Hunger Vital Sign    Worried About Running Out of Food in the Last Year: Never true    [place] in the Last Year: Never true  Transportation Needs: No Transportation Needs (09/29/2021)   PRAPARE - Hydrologist (Medical): No    Lack of Transportation (Non-Medical): No  Physical Activity: Sufficiently Active (09/29/2021)   Exercise Vital Sign    Days of Exercise per Week: 7 days    Minutes of Exercise per Session: 30 min  Stress: No Stress Concern Present (09/29/2021)   [place]    Feeling of Stress : Not at all  Social Connections: Unknown (09/29/2021)  Social Licensed conveyancer [NHANES]    Frequency of Communication with  Friends and Family: More than three times a week    Frequency of Social Gatherings with Friends and Family: Once a week    Attends Religious Services: Patient refused    Active Member of Clubs or Organizations: Yes    Attends Archivist Meetings: 1 to 4 times per year    Marital Status: Widowed    Tobacco Counseling Counseling given: Not Answered   Clinical Intake:     Pain : No/denies pain     BMI - recorded: 35.55 Nutritional Risks: None Diabetes: No  How often do you need to have someone help you when you read instructions, pamphlets, or other written materials from your doctor or pharmacy?: 1 - Never What is the last grade level you completed in school?: Graduate School  Diabetic? no  Interpreter Needed?: No  Information entered by :: Lisette Abu, LPN.   Activities of Daily Living    09/29/2021    3:47 PM 12/02/2020    2:12 PM  In your present state of health, do you have any difficulty performing the following activities:  Hearing? 0 0  Vision? 0 0  Difficulty concentrating or making decisions? 0 0  Walking or climbing stairs? 0 0  Dressing or bathing? 0 0  Doing errands, shopping? 0 0  Preparing Food and eating ? N   Using the Toilet? N   In the past six months, have you accidently leaked urine? N   Do you have problems with loss of bowel control? N   Managing your Medications? N   Managing your Finances? N   Housekeeping or managing your Housekeeping? N     Patient Care Team: Hoyt Koch, MD as PCP - General (Internal Medicine) Leonie Man, MD as PCP - Cardiology (Cardiology) Szabat, Darnelle Maffucci, [facility] (Pharmacist) Parnell, Tami Lin, [place] as Physician Assistant (Cardiology)  Indicate any recent Medical Services you may have received from other than [facility] providers in the past year (date may be approximate).     Assessment:   This is a routine wellness examination for [facility].  Hearing/Vision screen Hearing Screening -  Comments:: No hearing aids. No difficulty hearing. Vision Screening - Comments:: Patient wears eyeglasses. Eye exam done at: [facility] issues and exercise activities discussed: Current Exercise Habits: Home exercise routine, Type of exercise: walking (walking her dog everyday), Time (Minutes): 30, Frequency (Times/Week): 7, Weekly Exercise (Minutes/Week): 210, Intensity: Moderate, Exercise limited by: orthopedic condition(s)   Goals Addressed             This Visit's Progress    Continue to stay independent        Depression Screen    09/29/2021    3:41 PM 07/22/2021   10:29 AM 12/02/2020    2:11 PM 02/28/2020   10:30 AM 10/26/2018    8:41 AM 05/19/2018    4:14 PM 07/09/2016    1:03 PM  PHQ 2/9 Scores  PHQ - 2 Score 0 0 '1 5 3 4 5  '$ PHQ- 9 Score  0 '8 14 9 18 18    '$ Fall Risk    09/29/2021    3:38 PM 12/02/2020    2:10 PM 08/09/2019    3:05 PM 01/02/2019    1:04 PM 10/25/2018   11:22 AM  Fall Risk   Falls in the past year? 0 '1 1 1   '$ Number falls in past yr: 0  0 '1 1 1  '$ Injury with Fall? 0 1 1 0 0  Risk for fall due to : No Fall Risks      Follow up Falls evaluation completed        FALL RISK PREVENTION PERTAINING TO THE HOME:  Any stairs in or around the home? No  If so, are there any without handrails? No  Home free of loose throw rugs in walkways, pet beds, electrical cords, etc? Yes  Adequate lighting in your home to reduce risk of falls? Yes   ASSISTIVE DEVICES UTILIZED TO PREVENT FALLS:  Life alert? No  Use of a cane, walker or w/c? Yes  Grab bars in the bathroom? Yes  Shower chair or bench in shower? Yes  Elevated toilet seat or a handicapped toilet? Yes   TIMED UP AND GO:  Was the test performed? No .  Length of time to ambulate 10 feet: n/a sec.   Appearance of gait: Patient not evaluated for gait during this visit.  Cognitive Function:        09/29/2021    3:50 PM  6CIT Screen  What Year? 0 points  What month? 0 points  What time? 0  points  Count back from 20 0 points  Months in reverse 0 points  Repeat phrase 0 points  Total Score 0 points    Immunizations Immunization History  Administered Date(s) Administered   Fluad Quad(high Dose 65+) 12/26/2020   Influenza Split 02/01/2007, 12/16/2017   Influenza Whole 02/01/2007   Influenza,inj,Quad PF,6+ Mos 12/16/2017   Influenza-Unspecified 02/19/2013, 05/20/2014, 02/01/2016, 12/17/2016, 12/21/2018, 02/19/2020   Moderna Sars-Covid-2 Vaccination 05/13/2019, 06/10/2019, 02/12/2020, 08/12/2020, 12/26/2020   PNEUMOCOCCAL CONJUGATE-20 03/05/2021   Pneumococcal Conjugate-13 01/02/2019   Td 11/12/1997   Tdap 11/12/1997, 08/07/2014   Zoster Recombinat (Shingrix) 10/04/2019    TDAP status: Up to date  Flu Vaccine status: Up to date  Pneumococcal vaccine status: Up to date  Covid-19 vaccine status: Completed vaccines  Qualifies for Shingles Vaccine? Yes   Zostavax completed No   Shingrix Completed?: Yes  Screening Tests Health Maintenance  Topic Date Due   Zoster Vaccines- Shingrix (2 of 2) 12/06/2019   COVID-19 Vaccine (6 - Moderna series) 02/20/2021   INFLUENZA VACCINE  11/17/2021   MAMMOGRAM  01/16/2023   TETANUS/TDAP  08/06/2024   COLONOSCOPY (Pts 45-8yr Insurance coverage will need to be confirmed)  05/19/2030   Pneumonia Vaccine 66 Years old  Completed   DEXA SCAN  Completed   Hepatitis C Screening  Completed   HPV VACCINES  Aged Out    Health Maintenance  Health Maintenance Due  Topic Date Due   Zoster Vaccines- Shingrix (2 of 2) 12/06/2019   COVID-19 Vaccine (6 - Moderna series) 02/20/2021    Colorectal cancer screening: Type of screening: Colonoscopy. Completed 05/19/2020. Repeat every 5 years  Mammogram status: Completed 01/15/2021. Repeat every year  Bone Density status: Completed 04/02/2021. Results reflect: Bone density results: OSTEOPOROSIS. Repeat every 2-3 years.  Lung Cancer Screening: (Low Dose CT Chest recommended if Age 68-80 years, 30 pack-year currently smoking OR have quit w/in 15years.) does not qualify.   Lung Cancer Screening Referral: no  Additional Screening:  Hepatitis C Screening: does not qualify; Completed no  Vision Screening: Recommended annual ophthalmology exams for early detection of glaucoma and other disorders of the eye. Is the patient up to date with their annual eye exam?  Yes  Who is the provider or what is the name of the office in  which the patient attends annual eye exams? [facility] If pt is not established with a provider, would they like to be referred to a provider to establish care? No .   Dental Screening: Recommended annual dental exams for proper oral hygiene  Community Resource Referral / Chronic Care Management: CRR required this visit?  No   CCM required this visit?  No      Plan:     I have personally reviewed and noted the following in the patient's chart:   Medical and social history Use of alcohol, tobacco or illicit drugs  Current medications and supplements including opioid prescriptions.  Functional ability and status Nutritional status Physical activity Advanced directives List of other physicians Hospitalizations, surgeries, and ER visits in previous 12 months Vitals Screenings to include cognitive, depression, and falls Referrals and appointments  In addition, I have reviewed and discussed with patient certain preventive protocols, quality metrics, and best practice recommendations. A written personalized care plan for preventive services as well as general preventive health recommendations were provided to patient.     Sheral Flow, LPN   [id number]   Nurse Notes:  There were no vitals filed for this visit. There is no height or weight on file to calculate BMI. Patient stated that she has no issues with gait or balance; does not use any assistive devices. Medications reviewed with patient; no opioid use noted.

## 2021-10-05 ENCOUNTER — Encounter: Payer: Self-pay | Admitting: Podiatry

## 2021-10-05 ENCOUNTER — Ambulatory Visit: Payer: Medicare HMO | Admitting: Podiatry

## 2021-10-05 DIAGNOSIS — M7671 Peroneal tendinitis, right leg: Secondary | ICD-10-CM | POA: Diagnosis not present

## 2021-10-05 DIAGNOSIS — M7672 Peroneal tendinitis, left leg: Secondary | ICD-10-CM

## 2021-10-05 DIAGNOSIS — M21619 Bunion of unspecified foot: Secondary | ICD-10-CM

## 2021-10-07 ENCOUNTER — Ambulatory Visit: Payer: Medicare HMO | Admitting: Cardiology

## 2021-10-07 ENCOUNTER — Encounter: Payer: Self-pay | Admitting: Cardiology

## 2021-10-07 VITALS — BP 148/86 | HR 74 | Ht 65.0 in | Wt 231.0 lb

## 2021-10-07 DIAGNOSIS — R071 Chest pain on breathing: Secondary | ICD-10-CM

## 2021-10-07 DIAGNOSIS — R000 Tachycardia, unspecified: Secondary | ICD-10-CM

## 2021-10-07 DIAGNOSIS — R011 Cardiac murmur, unspecified: Secondary | ICD-10-CM | POA: Diagnosis not present

## 2021-10-07 DIAGNOSIS — M7989 Other specified soft tissue disorders: Secondary | ICD-10-CM | POA: Diagnosis not present

## 2021-10-07 DIAGNOSIS — G4733 Obstructive sleep apnea (adult) (pediatric): Secondary | ICD-10-CM

## 2021-10-07 DIAGNOSIS — Z86718 Personal history of other venous thrombosis and embolism: Secondary | ICD-10-CM | POA: Diagnosis not present

## 2021-10-07 DIAGNOSIS — R0609 Other forms of dyspnea: Secondary | ICD-10-CM

## 2021-10-07 DIAGNOSIS — I10 Essential (primary) hypertension: Secondary | ICD-10-CM

## 2021-10-07 MED ORDER — TORSEMIDE 20 MG PO TABS: 40.0000 mg | ORAL_TABLET | ORAL | 3 refills | Status: DC

## 2021-10-07 MED ORDER — TORSEMIDE 20 MG PO TABS: 20.0000 mg | ORAL_TABLET | ORAL | 3 refills | Status: DC

## 2021-10-07 NOTE — Patient Instructions (Signed)
.  Medication Instructions:    Torsemide 20 mg ( 1 tablet)  2 to 3 times a week  ( standing dose)  - may  take an additional 20 mg  on the other days if needed for swelling   *If you need a refill on your cardiac medications before your next appointment, please call your pharmacy*   Lab Work: Not needed    Testing/Procedures:  [facility] has recommended that you have a cardiopulmonary stress test (CPX). CPX testing is a non-invasive measurement of heart and lung function. It replaces a traditional treadmill stress test. This type of test provides a tremendous amount of information that relates not only to your present condition but also for future outcomes. This test combines measurements of you ventilation, respiratory gas exchange in the lungs, electrocardiogram (EKG), blood pressure and physical response before, during, and following an exercise protocol.    Follow-Up: At [facility], you and your health needs are our priority.  As part of our continuing mission to provide you with exceptional heart care, we have created designated Provider Care Teams.  These Care Teams include your primary Cardiologist (physician) and Advanced Practice Providers (APPs -  Physician Assistants and Nurse Practitioners) who all work together to provide you with the care you need, when you need it.     Your next appointment:   1 to 2 month(s)  The format for your next appointment:   In Person  Provider:   Glenetta Hew, MD    Other Instructions

## 2021-10-07 NOTE — Progress Notes (Signed)
Subjective:   Patient ID: [name], female   DOB: 66 y.o.   MRN: [id number]   HPI Patient presents with lesion formation and tendinitis-like symptomatology stating that it gets sore on both feet   ROS      Objective:  Physical Exam  Neurovascular status intact lesions plantar aspect fifth metatarsal bilateral fluid around the base of the fifth metatarsal with abnormal gait pattern     Assessment:  Numerous issues with structural malalignment along with inflammatory changes base of fifth metatarsal lesion formation     Plan:  H&P reviewed condition discussed continued ice therapy anti-inflammatories and I do not recommend surgery at this point but eventually may require shaving of the area.  Spent a great deal time going over what could be done surgically but at this time organ to hold off and just treat conservatively

## 2021-10-07 NOTE — Progress Notes (Signed)
Primary Care Provider: Hoyt Koch, MD Cardiologist: Glenetta Hew, MD Electrophysiologist: None  Clinic Note: Chief Complaint  Patient presents with   Follow-up    66-monthfollow-up after Zio patch   Shortness of Breath    Persistent exertional dyspnea   Palpitations    Overall palpitations are better.  Reviewed monitor results.  There was concern of possible A-fib, not On the monitor.  Continue to follow symptoms.    ===================================  ASSESSMENT/PLAN   Problem List Items Addressed This Visit       Cardiology Problems   Essential hypertension (Chronic)    Blood pressure is high today, but she says at home it is in the 150s over 120s.  She was struggling to get in here in a rush.  She thinks that her blood pressures is probably not indicative of her true pressures.  For now continue amlodipine.  With her having issues with palpitation, if we needed more blood pressure control, would probably consider adding carvedilol.         Relevant Medications   torsemide (DEMADEX) 20 MG tablet   Other Relevant Orders   Cardiopulmonary exercise test     Other   Chest pain on breathing    Evaluate for coronary ischemia with a CPX.      Relevant Orders   Cardiopulmonary exercise test   DOE (dyspnea on exertion) - Primary    She still short of breath with minimal exertion.  Echo looked pretty on last check, and she seems pretty euvolemic so is unlikely that she has had worsening CHF.-Last echo was in September 2020 with stable from 2021.  Nonischemic Coronary CTA in 2021 as well.  She is morbidly obese, very deconditioned not likely able to do much STEMI of significant activity.  I would actually like to evaluate her with a CPX (cardiopulmonary stress test).  They can help uKoreadelineate the difference between cardiac, pulmonary and noncardiopulmonary disease (obesity, deconditioning, injury, chronotropic incompetence, etc.).  She also has symptoms that  sound somewhat consistent with chronic allergy type conditions.  Will defer to PCP for further evaluation.  She does have some orthopnea which is probably combination of body habitus and potential diastolic dysfunction.  We will add back the torsemide that she was on before..  I would asked that she take at least 2-3 times a week standing dose and take additional 20 mg as needed for swelling worsening dyspnea, PND or orthopnea.      Relevant Orders   Cardiopulmonary exercise test   Leg swelling   Rapid heartbeat    Short little bursts of PAT noted on monitor but not symptomatic.  At this point, no evidence to suggest atrial fibrillation.  Continue to monitor on her Apple Watch.  Low threshold to consider adding beta-blocker.      History of deep vein thrombosis (DVT) of lower extremity (Chronic)   Morbid obesity (HCC) (Chronic)    Ultimately, irregular morbid obesity is a major factor in her exertional dyspnea and fatigue.      OBSTRUCTIVE SLEEP APNEA (Chronic)    She is still obese with an Epworth score that was significantly elevated.  Did not go into details whether she is or is not on CPAP.  Need to confirm because this could explain some of her orthopnea symptoms and exertional dyspnea.      Systolic ejection murmur (Chronic)    Probably explained by aortic valve calcification/sclerosis but no stenosis.  Echo did not show any other  cause for murmur.      ===================================  HPI:    [name] is a 66 y.o. female with a PMH notable for bilateral DVT-s/p IVC filter in 2009 (no longer on [place]), Obesity-S/p Bariatric Surgery (Gastric Bypass), HTN, OSA on CPAP who presents today for 1 month follow-up at the request of Hoyt Koch, *.  I have not seen Ms. Ricci since April 2021 this was a follow-up for an event monitor and echocardiogram.  Relatively benign.  She was post to have gotten a coronary CTA done at the time but never got scheduled.   Epworth score was 20.  Unfortunate she gained back a lot of the weight that she lost after initial bariatric surgery. => Split-night sonogram ordered, and we reorder the Coronary CTA.  Plan was to follow-up after the Coronary CTA => this ended up being a virtual visit with Jory Sims, NP on 10/07/2019: She indicated that she was having exertional dyspnea and chest tightness with simply walking around. Coronary CTA indicated Coronary Calcium Score of 117 with minimal nonobstructive disease (less than 24%).  Very reassuring. => Suggested that exertional dyspnea anginal in nature. Notable limited mobility-walking with a cane. Still waiting for sleep study.  Recent Hospitalizations:  ER visit 07/17/2021 for shortness of breath, cough and congestion-COVID-negative.  Ended up being placed on multiple different medications and developed C. difficile.  [name] was last seen on Aug 28, 2021 by Fabian Sharp for evaluation of palpitations and exertional dyspnea.  Apple watch was showing potential abnormal rhythms's consistent with A-fib (part of the [employer] Heartline Trial.  Noted palpitations and DOE with some lightheadedness, no chest pain syncope or near syncope.  No sign tachycardia.  Noted exertional dizziness.  No chest pain.  No edema.  Has been noticing palpitations for the last 6 to 7 months.  Apple watch recordings reviewed and possible A-fib. 14-day Zio patch ordered to evaluate for possible A-fib..   Reviewed  CV studies:    The following studies were reviewed today: (if available, images/films reviewed: From Epic Chart or Care Everywhere) Coronary CTA 09/21/2019: Coronary Calcium Score 0.  Normal RCA dominant coronary anatomy.  CAD RADS 1-minimal nonobstructive CAD (0-24%).  Consider nonatherosclerotic cause for chest pain.  Consider preventative therapy and risk factor modification. TTE 12/2020: EF 55 to 60%.  No RWMA.  Mild LVH.  GR 1 DD-moderate LA dilation..  Mild MR..  Normal RV  size/function.  Normal PAP.  Normal RAP.  Zio patch monitor May-June 2023: Predominantly sinus rhythm with rate range 45 to 135 bpm.  Average 73 bpm.  Occasional (1.3%) PACs with rare PVCs and rare PAC couplets.  24 episodes of atrial runs: Fastest was 11 beats at a rate of 184 bpm, longest was 13 beats with a rate of 92 bpm.  No sustained tachyarrhythmias or bradycardia arrhythmias.  No atrial fibrillation or flutter.   Interval History:   [name] returns here today for follow-up of her monitor.  She did not necessarily notice any significant palpitations while wearing the monitor.  This is the better she really only notes it when she is walking her dog which is the form of exercise she gets.  She has not had any significant prolonged irregular heartbeats or palpitations.  However she still notes exertional dyspnea. On the monitor there were 24 short bursts of PAT and she did not necessarily notice the majority of them..  In fact only 1 out of 24 was noted on  patient triggered or diary  She says that she is doing relatively well.  She does have some orthopnea sleeping on 3 pillows but no real PND.  She does have increased abdominal girth which makes it hard for her to lie down.  No real edema.  She has not had any exertional chest pain or pressure but is notably dyspneic.  Even dyspneic here on exam.  She does not really notice significant lower extremity edema but with orthopnea and underlying dyspnea, does not seem fully euvolemic.   CV Review of Symptoms (Summary) Cardiovascular ROS: positive for - dyspnea on exertion, irregular heartbeat, orthopnea, shortness of breath, and lightheadedness, fatigue, exercise intolerance. negative for - chest pain, edema, paroxysmal nocturnal dyspnea, rapid heart rate, or  syncope/near syncope or TIA/amaurosis fugax, claudication  REVIEWED OF SYSTEMS   Review of Systems  Constitutional:  Positive for malaise/fatigue. Negative for chills, fever  and weight loss.  HENT:  Negative for congestion and nosebleeds.   Respiratory:  Positive for shortness of breath and wheezing. Negative for cough.   Cardiovascular:  Negative for leg swelling.       Per HPI  Gastrointestinal:  Negative for blood in stool and melena.  Genitourinary:  Negative for dysuria, frequency and hematuria.  Musculoskeletal:  Positive for back pain and joint pain. Negative for falls and myalgias.  Neurological:  Positive for dizziness and weakness. Negative for focal weakness and headaches.  Psychiatric/Behavioral:  Negative for depression and memory loss. The patient is nervous/anxious. The patient does not have insomnia.    I have reviewed and (if needed) personally updated the patient's problem list, medications, allergies, past medical and surgical history, social and family history.   PAST MEDICAL HISTORY   Past Medical History:  Diagnosis Date   Anemia    takes Ferrous Sulfate daily   Anxiety    takes Citalopram daily   Arthritis    Asthma    2004-prior to gastric bypass and no problems since   CHF (congestive heart failure) ([place])    takes Lasix daily as needed   Chronic back pain    spondylolisthesis/stenosis/radiculopathy   Complication of anesthesia yrs ago   slow to wake up   Depression    Diabetes ([place])    DVT (deep venous thrombosis) ([place]) 01/17/2013   past hx. -tx.5-6 yrs ago bilateral legs, occ. sporadic swelling, has IVC filter implanted   Dysrhythmia    "heart tends to flutter"   Fibromyalgia    Fracture    right foot and is in a cam boot   Gallstones    GERD (gastroesophageal reflux disease)    hx of-no meds now   Heart murmur    History of bronchitis 2012 or 2013   History of colon polyps    Hypertension    takes Metoprolol daily   Insomnia    takes Melatonin daily   Pelvic floor dysfunction    Peripheral neuropathy    Pneumonia 90's   hx of   S/P gastric bypass 2003   Sleep apnea    no cpap used in many yrs after weight  lost-no machine now   Urinary frequency    Urinary urgency    Vitamin D deficiency     PAST SURGICAL HISTORY   Past Surgical History:  Procedure Laterality Date   BALLOON DILATION N/A 01/31/2013   Procedure: BALLOON DILATION;  Surgeon: Arta Silence, MD;  Location: [facility];  Service: Endoscopy;  Laterality: N/A;   CARDIAC EVENT MONITOR  03/2019  Predominantly sinus rhythm.  Rates range from 48-124 bpm.  Average 74 bpm.  Frequent short bursts (3-15 beats) PAT/PSVT--not indicated as being symptomatic on diary.  Otherwise rare PACs and PVCs.   CARDIAC EVENT MONITOR  08/2021   14-Day Zio Patch: Predominantly sinus rhythm with rate range 45 to 135 bpm.  Average 73 bpm.  Occasional (1.3%) PACs with rare PVCs and rare PAC couplets.  24 episodes of atrial runs: Fastest was 11 beats at a rate of 184 bpm, longest was 13 beats with a rate of 92 bpm.  No sustained tachyarrhythmias or bradycardia arrhythmias.  No atrial fibrillation or flutter.   CHOLECYSTECTOMY  2007   COLONOSCOPY     CT CTA CORONARY W/CA SCORE W/CM &/OR WO/CM  09/21/2019   Coronary Calcium Score 0.  Normal RCA dominant coronary anatomy.  CAD RADS 1-minimal nonobstructive CAD (0-24%).  Consider nonatherosclerotic cause for chest pain.  Consider preventative therapy and risk factor modification.   DILATION AND CURETTAGE OF UTERUS  yrs ago   ESOPHAGOGASTRODUODENOSCOPY (EGD) WITH PROPOFOL  03/01/2012   Procedure: ESOPHAGOGASTRODUODENOSCOPY (EGD) WITH PROPOFOL;  Surgeon: Arta Silence, MD;  Location: [facility];  Service: Endoscopy;  Laterality: N/A;   ESOPHAGOGASTRODUODENOSCOPY (EGD) WITH PROPOFOL N/A 01/31/2013   Procedure: ESOPHAGOGASTRODUODENOSCOPY (EGD) WITH PROPOFOL;  Surgeon: Arta Silence, MD;  Location: [facility];  Service: Endoscopy;  Laterality: N/A;   ESOPHAGOGASTRODUODENOSCOPY (EGD) WITH PROPOFOL N/A 09/02/2015   Procedure: ESOPHAGOGASTRODUODENOSCOPY (EGD) WITH PROPOFOL;  Surgeon: Gatha Mayer, MD;  Location:  [facility];  Service: Endoscopy;  Laterality: N/A;   GASTRIC BY-PASS  2004   HERNIA REPAIR  2005   INSERTION OF VENA CAVA FILTER  01/17/2013   inserted 2004- "abdomen"   LIGAMENT REPAIR Right 1987   Rt. knee scope   MAXIMUM ACCESS (MAS)POSTERIOR LUMBAR INTERBODY FUSION (PLIF) 2 LEVEL N/A 06/07/2014   Procedure: L4-5 L5-S1 FOR MAXIMUM ACCESS (MAS) POSTERIOR LUMBAR INTERBODY FUSION ;  Surgeon: Erline Levine, MD;  Location: [facility];  Service: Neurosurgery;  Laterality: N/A;  L4-5 L5-S1 FOR MAXIMUM ACCESS (MAS) POSTERIOR LUMBAR INTERBODY FUSION    TONSILLECTOMY     as child   TRANSTHORACIC ECHOCARDIOGRAM  12/2020   EF 55 to 60%.  No RWMA.  Mild LVH.  GR 1 DD-moderate LA dilation..  Mild MR..  Normal RV size/function.  Normal PAP.  Normal RAP. => Essentially stable from December 2020    Immunization History  Administered Date(s) Administered   Fluad Quad(high Dose 65+) 12/26/2020   Influenza Split 02/01/2007, 12/16/2017   Influenza Whole 02/01/2007   Influenza,inj,Quad PF,6+ Mos 12/16/2017   Influenza-Unspecified 02/19/2013, 05/20/2014, 02/01/2016, 12/17/2016, 12/21/2018, 02/19/2020   Moderna Sars-Covid-2 Vaccination 05/13/2019, 06/10/2019, 02/12/2020, 08/12/2020, 12/26/2020   PNEUMOCOCCAL CONJUGATE-20 03/05/2021   Pneumococcal Conjugate-13 01/02/2019   Td 11/12/1997   Tdap 11/12/1997, 08/07/2014   Zoster Recombinat (Shingrix) 10/04/2019    MEDICATIONS/ALLERGIES   Current Meds  Medication Sig   albuterol (PROVENTIL) (2.5 MG/3ML) 0.083% nebulizer solution Take 3 mLs (2.5 mg total) by nebulization every 6 (six) hours as needed for wheezing or shortness of breath.   albuterol (VENTOLIN HFA) 108 (90 Base) MCG/ACT inhaler INHALE 2 PUFFS INTO THE LUNGS EVERY 6 HOURS AS NEEDED FOR WHEEZING OR SHORTNESS OF BREATH   alendronate (FOSAMAX) 70 MG tablet Take 1 tablet (70 mg total) by mouth every 7 (seven) days. Take with a full glass of water on an empty stomach.   amLODipine (NORVASC) 10  MG tablet TAKE 1 TABLET EVERY DAY   Ascorbic  Acid (VITAMIN C) 1000 MG tablet Take 1 tablet by mouth daily.   benzonatate (TESSALON) 100 MG capsule Take 1 capsule (100 mg total) by mouth 3 (three) times daily as needed.   buPROPion (WELLBUTRIN XL) 300 MG 24 hr tablet Take 1 tablet (300 mg total) by mouth daily.   cetirizine (ZYRTEC) 10 MG tablet Take 1 tablet by mouth daily.   ferrous sulfate 325 (65 FE) MG tablet Take 1 tablet by mouth daily with breakfast.   fluticasone-salmeterol (ADVAIR) 100-50 MCG/ACT AEPB Inhale 1 puff into the lungs 2 (two) times daily.   gabapentin (NEURONTIN) 100 MG capsule Take 2 capsules (200 mg total) by mouth 2 (two) times daily.   ipratropium (ATROVENT) 0.03 % nasal spray Place 2 sprays into both nostrils every 12 (twelve) hours. X 5-7 days   lipase/protease/amylase (CREON) 36000 UNITS CPEP capsule Take 1 capsule by mouth in the morning, at noon, in the evening, and at bedtime.   Multiple Vitamin (MULTI-VITAMIN) tablet Take by mouth daily. Take 1 Tablet Daily   ondansetron (ZOFRAN) 4 MG tablet Take 1 tablet (4 mg total) by mouth every 8 (eight) hours as needed for nausea or vomiting.   torsemide (DEMADEX) 20 MG tablet Take 1 tablet (20 mg total) by mouth 3 (three) times a week. May take an additional '20mg'$  ( 1 tablets) as needed on the other days   traMADol (ULTRAM) 50 MG tablet Take 50 mg by mouth every 6 (six) hours as needed.   [DISCONTINUED] PARoxetine (PAXIL) 10 MG tablet TAKE 1 TABLET(10 MG) BY MOUTH DAILY   [DISCONTINUED] torsemide (DEMADEX) 20 MG tablet Take 2 tablets (40 mg total) by mouth 3 (three) times a week. May  take an additional 40  ( 2 tablets)  as needed on the other days    Allergies  Allergen Reactions   Carafate [Sucralfate] Hives, Itching and Other (See Comments)    Angioedema      Sulfonamide Derivatives Rash    Syncope    SOCIAL HISTORY/FAMILY HISTORY   Reviewed in Epic:  Pertinent findings:  Social History   Tobacco Use    Smoking status: Former    Packs/day: 0.50    Years: 10.00    Total pack years: 5.00    Types: Cigarettes    Quit date: 04/19/2009    Years since quitting: 12.5   Smokeless tobacco: Never  Substance Use Topics   Alcohol use: Yes    Alcohol/week: 0.0 standard drinks of alcohol    Comment: occ. social- wine-1 drink monthly   Drug use: No   Social History   Social History Narrative   Widow.  No kids.  Lives with her mother who is 66.  She does walk with a cane.  She is a retired Investment banker, corporate   She quit smoking in 2003    OBJCTIVE -PE, EKG, labs   Wt Readings from Last 3 Encounters:  10/13/21 227 lb 9.6 oz (103.2 kg)  10/07/21 231 lb (104.8 kg)  09/08/21 227 lb (103 kg)    Physical Exam: BP (!) 148/86   Pulse 74   Ht '5\' 5"'$  (1.651 m)   Wt 231 lb (104.8 kg)   SpO2 97%   BMI 38.44 kg/m  Physical Exam Vitals reviewed.  Constitutional:      General: She is not in acute distress.    Appearance: She is obese. She is not ill-appearing or toxic-appearing.  HENT:     Head: Normocephalic and atraumatic.  Neck:  Vascular: Hepatojugular reflux present. No carotid bruit or JVD.  Cardiovascular:     Rate and Rhythm: Normal rate and regular rhythm. No extrasystoles are present.    Chest Wall: PMI is not displaced.     Pulses: Decreased pulses (Difficult to palpate due to body habitus.).     Heart sounds: S1 normal and S2 normal. Heart sounds are distant. Murmur (Soft 1/6 SEM at RUSB.) heard.     No friction rub. No gallop.  Pulmonary:     Effort: Pulmonary effort is normal. No respiratory distress.     Breath sounds: No stridor. Wheezing present. No rhonchi or rales.     Comments: Distant heart sounds.   Chest:     Chest wall: Tenderness present.  Abdominal:     General: There is no distension.     Palpations: Abdomen is soft.     Comments: Obese.  Unable to really truly assess HSM or bruit.  Musculoskeletal:        General: Swelling (1+ bilateral LE) present.  Normal range of motion.     Cervical back: Normal range of motion and neck supple.  Skin:    General: Skin is warm and dry.     Coloration: Skin is not pale.     Findings: No bruising.  Neurological:     General: No focal deficit present.     Mental Status: She is alert and oriented to person, place, and time. Mental status is at baseline.     Gait: Gait abnormal (Mild antalgic gait, walks with a cane.).  Psychiatric:        Mood and Affect: Mood normal.        Behavior: Behavior normal.        Thought Content: Thought content normal.        Judgment: Judgment normal.     Adult ECG Report Not checked  Recent Labs: Reviewed Lab Results  Component Value Date   CHOL 146 01/02/2019   HDL 73.40 01/02/2019   LDLCALC 60 01/02/2019   TRIG 66.0 01/02/2019   CHOLHDL 2 01/02/2019   Lab Results: 07/22/2021   Component Value   CREATININE 0.86   BUN 15   NA 141   K 3.0   CL 112   CO2 20      Latest Ref Rng & Units 07/22/2021   11:01 AM 07/17/2021    4:20 PM  CBC  WBC 4.0 - 10.5 K/uL 10.7  10.8   Hemoglobin 12.0 - 15.0 g/dL 11.2  10.6   Hematocrit 36.0 - 46.0 % 34.8  34.9   Platelets 150.0 - 400.0 K/uL 500.0  442     Lab Results  Component Value Date   HGBA1C 5.5 01/02/2019   Lab Results  Component Value Date   TSH 0.55 10/15/2020    ==================================================  COVID-19 Education: The signs and symptoms of COVID-19 were discussed with the patient and how to seek care for testing (follow up with PCP or arrange E-visit).    I spent a total of 26 minutes with the patient spent in direct patient consultation.  Additional time spent with chart review  / charting (studies, outside notes, etc): 18 min Total Time: 44 min  Current medicines are reviewed at length with the patient today.  (+/- concerns) N/A    This visit occurred during the SARS-CoV-2 public health emergency.  Safety protocols were in place, including screening questions prior to the  visit, additional usage of staff PPE, and extensive cleaning  of exam room while observing appropriate contact time as indicated for disinfecting solutions.  Notice: This dictation was prepared with Dragon dictation along with smart phrase technology. Any transcriptional errors that result from this process are unintentional and may not be corrected upon review.  Studies Ordered:   Orders Placed This Encounter  Procedures   Cardiopulmonary exercise test   Meds ordered this encounter  Medications   DISCONTD: torsemide (DEMADEX) 20 MG tablet    Sig: Take 2 tablets (40 mg total) by mouth 3 (three) times a week. May  take an additional 40  ( 2 tablets)  as needed on the other days    Dispense:  180 tablet    Refill:  3   torsemide (DEMADEX) 20 MG tablet    Sig: Take 1 tablet (20 mg total) by mouth 3 (three) times a week. May take an additional '20mg'$  ( 1 tablets) as needed on the other days    Dispense:  90 tablet    Refill:  3    Disregard previous dose for 40 mg    Patient Instructions / Medication Changes & Studies & Tests Ordered   Patient Instructions  .Medication Instructions:    Torsemide 20 mg ( 1 tablet)  2 to 3 times a week  ( standing dose)  - may  take an additional 20 mg  on the other days if needed for swelling   *If you need a refill on your cardiac medications before your next appointment, please call your pharmacy*   Lab Work: Not needed    Testing/Procedures:  Will be done at [facility] has recommended that you have a cardiopulmonary stress test (CPX). CPX testing is a non-invasive measurement of heart and lung function. It replaces a traditional treadmill stress test. This type of test provides a tremendous amount of information that relates not only to your present condition but also for future outcomes. This test combines measurements of you ventilation, respiratory gas exchange in the lungs, electrocardiogram  (EKG), blood pressure and physical response before, during, and following an exercise protocol.    Follow-Up: At [facility], you and your health needs are our priority.  As part of our continuing mission to provide you with exceptional heart care, we have created designated Provider Care Teams.  These Care Teams include your primary Cardiologist (physician) and Advanced Practice Providers (APPs -  Physician Assistants and Nurse Practitioners) who all work together to provide you with the care you need, when you need it.     Your next appointment:   1 to 2 month(s)  The format for your next appointment:   In Person  Provider:   Glenetta Hew, MD    Other Instructions        Glenetta Hew, M.D., M.S. Interventional Cardiologist   Pager # [phone] Phone # [phone] [address] [place], [place] [address]   Thank you for choosing [facility] at [facility]!!

## 2021-10-09 ENCOUNTER — Ambulatory Visit: Payer: Medicare HMO | Admitting: Internal Medicine

## 2021-10-13 ENCOUNTER — Encounter: Payer: Self-pay | Admitting: Internal Medicine

## 2021-10-13 ENCOUNTER — Ambulatory Visit (INDEPENDENT_AMBULATORY_CARE_PROVIDER_SITE_OTHER): Payer: Medicare HMO | Admitting: Internal Medicine

## 2021-10-13 VITALS — BP 128/80 | HR 72 | Resp 18 | Ht 65.0 in | Wt 227.6 lb

## 2021-10-13 DIAGNOSIS — Z9884 Bariatric surgery status: Secondary | ICD-10-CM | POA: Diagnosis not present

## 2021-10-13 DIAGNOSIS — R079 Chest pain, unspecified: Secondary | ICD-10-CM | POA: Diagnosis not present

## 2021-10-13 DIAGNOSIS — E559 Vitamin D deficiency, unspecified: Secondary | ICD-10-CM | POA: Diagnosis not present

## 2021-10-13 DIAGNOSIS — R7989 Other specified abnormal findings of blood chemistry: Secondary | ICD-10-CM

## 2021-10-13 DIAGNOSIS — F331 Major depressive disorder, recurrent, moderate: Secondary | ICD-10-CM

## 2021-10-13 DIAGNOSIS — R432 Parageusia: Secondary | ICD-10-CM | POA: Diagnosis not present

## 2021-10-13 DIAGNOSIS — R197 Diarrhea, unspecified: Secondary | ICD-10-CM

## 2021-10-13 LAB — VITAMIN B12: Vitamin B-12: >1504 pg/mL — ABNORMAL HIGH (ref 211–911)

## 2021-10-13 LAB — CBC
HCT: 37.7 % (ref 36.0–46.0)
Hemoglobin: 12.1 g/dL (ref 12.0–15.0)
MCHC: 32.1 g/dL (ref 30.0–36.0)
MCV: 83.6 fl (ref 78.0–100.0)
Platelets: 260 10*3/uL (ref 150.0–400.0)
RBC: 4.51 Mil/uL (ref 3.87–5.11)
RDW: 18.6 % — ABNORMAL HIGH (ref 11.5–15.5)
WBC: 6.6 10*3/uL (ref 4.0–10.5)

## 2021-10-13 LAB — COMPREHENSIVE METABOLIC PANEL
ALT: 52 U/L — ABNORMAL HIGH (ref 0–35)
AST: 49 U/L — ABNORMAL HIGH (ref 0–37)
Albumin: 4.1 g/dL (ref 3.5–5.2)
Alkaline Phosphatase: 109 U/L (ref 39–117)
BUN: 20 mg/dL (ref 6–23)
CO2: 21 mEq/L (ref 19–32)
Calcium: 9 mg/dL (ref 8.4–10.5)
Chloride: 112 mEq/L (ref 96–112)
Creatinine, Ser: 1.17 mg/dL (ref 0.40–1.20)
GFR: 48.12 mL/min — ABNORMAL LOW (ref >60.00)
Glucose, Bld: 94 mg/dL (ref 70–99)
Potassium: 3.7 mEq/L (ref 3.5–5.1)
Sodium: 142 mEq/L (ref 135–145)
Total Bilirubin: 0.3 mg/dL (ref 0.2–1.2)
Total Protein: 7 g/dL (ref 6.0–8.3)

## 2021-10-13 LAB — VITAMIN D 25 HYDROXY (VIT D DEFICIENCY, FRACTURES): VITD: 27.33 ng/mL — ABNORMAL LOW (ref 30.00–100.00)

## 2021-10-13 MED ORDER — PAROXETINE HCL 10 MG PO TABS: 10.0000 mg | ORAL_TABLET | Freq: Every day | ORAL | 3 refills | Status: DC

## 2021-10-13 NOTE — Progress Notes (Signed)
   Subjective:   Patient ID: [name], female    DOB: 08-19-1955, 66 y.o.   MRN: [id number]  HPI The patient is a 66 YO female coming in for concerns.   Review of Systems  Constitutional:  Positive for activity change and appetite change.  HENT: Negative.    Eyes: Negative.   Respiratory:  Positive for shortness of breath. Negative for cough and chest tightness.   Cardiovascular:  Negative for chest pain, palpitations and leg swelling.  Gastrointestinal:  Positive for abdominal pain and diarrhea. Negative for abdominal distention, constipation, nausea and vomiting.  Musculoskeletal: Negative.   Skin: Negative.   Neurological: Negative.   Psychiatric/Behavioral:  Positive for decreased concentration and dysphoric mood.     Objective:  Physical Exam Constitutional:      Appearance: She is well-developed. She is ill-appearing.  HENT:     Head: Normocephalic and atraumatic.  Cardiovascular:     Rate and Rhythm: Normal rate and regular rhythm.  Pulmonary:     Effort: Pulmonary effort is normal. No respiratory distress.     Breath sounds: Normal breath sounds. No wheezing or rales.  Abdominal:     General: Bowel sounds are normal. There is distension.     Palpations: Abdomen is soft.     Tenderness: There is no abdominal tenderness. There is no rebound.  Musculoskeletal:     Cervical back: Normal range of motion.  Skin:    General: Skin is warm and dry.  Neurological:     Mental Status: She is alert and oriented to person, place, and time.     Coordination: Coordination normal.     Vitals:   10/13/21 1401  BP: 128/80  Pulse: 72  Resp: 18  SpO2: 98%  Weight: 227 lb 9.6 oz (103.2 kg)  Height: '5\' 5"'$  (1.651 m)    Assessment & Plan:

## 2021-10-15 DIAGNOSIS — Z9884 Bariatric surgery status: Secondary | ICD-10-CM | POA: Diagnosis not present

## 2021-10-15 DIAGNOSIS — R152 Fecal urgency: Secondary | ICD-10-CM | POA: Diagnosis not present

## 2021-10-15 DIAGNOSIS — K8681 Exocrine pancreatic insufficiency: Secondary | ICD-10-CM | POA: Diagnosis not present

## 2021-10-15 DIAGNOSIS — K219 Gastro-esophageal reflux disease without esophagitis: Secondary | ICD-10-CM | POA: Diagnosis not present

## 2021-10-16 DIAGNOSIS — R432 Parageusia: Secondary | ICD-10-CM | POA: Insufficient documentation

## 2021-10-16 DIAGNOSIS — H6983 Other specified disorders of Eustachian tube, bilateral: Secondary | ICD-10-CM | POA: Diagnosis not present

## 2021-10-16 DIAGNOSIS — R430 Anosmia: Secondary | ICD-10-CM | POA: Diagnosis not present

## 2021-10-16 DIAGNOSIS — H93293 Other abnormal auditory perceptions, bilateral: Secondary | ICD-10-CM | POA: Diagnosis not present

## 2021-10-16 NOTE — Assessment & Plan Note (Signed)
Suspect this could be related to treatment for her treatment of cryptosporidium back in April as this is when her symptoms started. This may come back and timeline unknown. Advised she can see ENT for assessment.

## 2021-10-16 NOTE — Assessment & Plan Note (Signed)
Checking vitamin D level and adjust as needed.  

## 2021-10-16 NOTE — Assessment & Plan Note (Signed)
Still having diarrhea and working with GI to help. This is much improved since treatment for her infection detected on stool sample.

## 2021-10-16 NOTE — Assessment & Plan Note (Signed)
Checking B12 and CBC and CMP and vitamin D. Adjust as needed.

## 2021-10-16 NOTE — Assessment & Plan Note (Addendum)
Worsening and rx paxil 10 mg daily to help in addition to her prior wellbutrin 300 mg daily. She is having some anxiety and depression. Counseled on risk and benefit.

## 2021-10-16 NOTE — Assessment & Plan Note (Signed)
She is getting stress test in the near future and we will follow up on results. She is still having chest pains and SOB.

## 2021-10-23 ENCOUNTER — Telehealth: Payer: Medicare HMO | Admitting: Emergency Medicine

## 2021-10-23 DIAGNOSIS — R0602 Shortness of breath: Secondary | ICD-10-CM

## 2021-10-23 NOTE — Patient Instructions (Signed)
[name], thank you for joining Carvel Getting, NP for today's virtual visit.  While this provider is not your primary care provider (PCP), if your PCP is located in our provider database this encounter information will be shared with them immediately following your visit.  Consent: (Patient) [name] provided verbal consent for this virtual visit at the beginning of the encounter.  Current Medications:  Current Outpatient Medications:    albuterol (PROVENTIL) (2.5 MG/3ML) 0.083% nebulizer solution, Take 3 mLs (2.5 mg total) by nebulization every 6 (six) hours as needed for wheezing or shortness of breath., Disp: 100 mL, Rfl: 1   albuterol (VENTOLIN HFA) 108 (90 Base) MCG/ACT inhaler, INHALE 2 PUFFS INTO THE LUNGS EVERY 6 HOURS AS NEEDED FOR WHEEZING OR SHORTNESS OF BREATH, Disp: 3 each, Rfl: 2   alendronate (FOSAMAX) 70 MG tablet, Take 1 tablet (70 mg total) by mouth every 7 (seven) days. Take with a full glass of water on an empty stomach., Disp: 12 tablet, Rfl: 3   amLODipine (NORVASC) 10 MG tablet, TAKE 1 TABLET EVERY DAY, Disp: 90 tablet, Rfl: 3   Ascorbic Acid (VITAMIN C) 1000 MG tablet, Take 1 tablet by mouth daily., Disp: , Rfl:    benzonatate (TESSALON) 100 MG capsule, Take 1 capsule (100 mg total) by mouth 3 (three) times daily as needed., Disp: 30 capsule, Rfl: 0   buPROPion (WELLBUTRIN XL) 300 MG 24 hr tablet, Take 1 tablet (300 mg total) by mouth daily., Disp: 90 tablet, Rfl: 3   cetirizine (ZYRTEC) 10 MG tablet, Take 1 tablet by mouth daily., Disp: , Rfl:    ferrous sulfate 325 (65 FE) MG tablet, Take 1 tablet by mouth daily with breakfast., Disp: , Rfl:    fluticasone-salmeterol (ADVAIR) 100-50 MCG/ACT AEPB, Inhale 1 puff into the lungs 2 (two) times daily., Disp: 1 each, Rfl: 3   gabapentin (NEURONTIN) 100 MG capsule, Take 2 capsules (200 mg total) by mouth 2 (two) times daily., Disp: 180 capsule, Rfl: 0   ipratropium (ATROVENT) 0.03 % nasal spray, Place 2  sprays into both nostrils every 12 (twelve) hours. X 5-7 days, Disp: 30 mL, Rfl: 0   lipase/protease/amylase (CREON) 36000 UNITS CPEP capsule, Take 1 capsule by mouth in the morning, at noon, in the evening, and at bedtime., Disp: , Rfl:    Multiple Vitamin (MULTI-VITAMIN) tablet, Take by mouth daily. Take 1 Tablet Daily, Disp: , Rfl:    ondansetron (ZOFRAN) 4 MG tablet, Take 1 tablet (4 mg total) by mouth every 8 (eight) hours as needed for nausea or vomiting., Disp: 20 tablet, Rfl: 0   PARoxetine (PAXIL) 10 MG tablet, Take 1 tablet (10 mg total) by mouth daily., Disp: 90 tablet, Rfl: 3   torsemide (DEMADEX) 20 MG tablet, Take 1 tablet (20 mg total) by mouth 3 (three) times a week. May take an additional '20mg'$  ( 1 tablets) as needed on the other days, Disp: 90 tablet, Rfl: 3   traMADol (ULTRAM) 50 MG tablet, Take 50 mg by mouth every 6 (six) hours as needed., Disp: , Rfl:    Medications ordered in this encounter:  No orders of the defined types were placed in this encounter.    *If you need refills on other medications prior to your next appointment, please contact your pharmacy*  Follow-Up: Call back or seek an in-person evaluation if the symptoms worsen or if the condition fails to improve as anticipated.  Other Instructions Please go to an ER to get  your shortness of breath checked.    If you have been instructed to have an in-person evaluation today at a local Urgent Care facility, please use the link below. It will take you to a list of all of our available [facility], including address, phone number and hours of operation. Please do not delay care.  [facility]  If you or a family member do not have a primary care provider, use the link below to schedule a visit and establish care. When you choose a [facility] primary care physician or advanced practice provider, you gain a long-term partner in health. Find a Primary Care Provider  Learn more about [facility]'s in-office and virtual care options: [facility]

## 2021-10-23 NOTE — Progress Notes (Signed)
Virtual Visit Consent   [name], you are scheduled for a virtual visit with a [facility] provider today. Just as with appointments in the office, your consent must be obtained to participate. Your consent will be active for this visit and any virtual visit you may have with one of our providers in the next 365 days. If you have a MyChart account, a copy of this consent can be sent to you electronically.  As this is a virtual visit, video technology does not allow for your provider to perform a traditional examination. This may limit your provider's ability to fully assess your condition. If your provider identifies any concerns that need to be evaluated in person or the need to arrange testing (such as labs, EKG, etc.), we will make arrangements to do so. Although advances in technology are sophisticated, we cannot ensure that it will always work on either your end or our end. If the connection with a video visit is poor, the visit may have to be switched to a telephone visit. With either a video or telephone visit, we are not always able to ensure that we have a secure connection.  By engaging in this virtual visit, you consent to the provision of healthcare and authorize for your insurance to be billed (if applicable) for the services provided during this visit. Depending on your insurance coverage, you may receive a charge related to this service.  I need to obtain your verbal consent now. Are you willing to proceed with your visit today? [name] has provided verbal consent on 10/23/2021 for a virtual visit (video or telephone). Carvel Getting, NP  Date: 10/23/2021 11:20 AM  Virtual Visit via Video Note   I, Carvel Getting, connected with  [name]  ([id number], Oct 29, 1955) on 10/23/21 at 11:15 AM EDT by a video-enabled telemedicine application and verified that I am speaking with the correct person using two identifiers.  Location: Patient: Virtual Visit Location  Patient: Home Provider: Virtual Visit Location Provider: Home Office   I discussed the limitations of evaluation and management by telemedicine and the availability of in person appointments. The patient expressed understanding and agreed to proceed.    History of Present Illness: [name] is a 66 y.o. who identifies as a female who was assigned female at birth, and is being seen today for shortness of breath.  Patient reports 2 days of feeling ill and progressively feeling worse and worsening shortness of breath and coughing spasms.  HPI: HPI  Problems:  Patient Active Problem List   Diagnosis Date Noted   Abnormal sense of taste 10/16/2021   Community acquired pneumonia 07/23/2021   Greater trochanteric bursitis, right 06/12/2021   Acromioclavicular arthrosis 06/12/2021   Pes anserinus bursitis of right knee 04/24/2021   Left hip pain 03/06/2021   Vitamin D deficiency 02/04/2021   Chest pain on breathing 08/11/2020   Closed fracture of left distal radius 05/01/2020   Recurrent Clostridioides difficile infection 02/28/2020   Pyelonephritis 02/28/2020   Abnormal urine odor 08/10/2019   Gastrointestinal complaint 07/05/2019   Muscle strain of gluteal region, right, initial encounter 05/11/2019   DOE (dyspnea on exertion) 03/13/2019   Rapid heartbeat 09/32/3557   Systolic ejection murmur 32/20/2542   Leg swelling 10/09/2018   Insomnia 05/19/2018   Frequent refractory urinary tract infections 05/05/2018   Right leg pain 12/16/2017   History of deep vein thrombosis (DVT) of lower extremity 12/12/2017   Cavovarus deformity of foot, acquired,  unspecified laterality 04/07/2017   S/P gastric bypass 08/18/2016   Muscle cramps 07/09/2016   Adjustment disorder with mixed anxiety and depressed mood 03/05/2016   Fatigue 03/05/2016   Cervical disc disorder with radiculopathy of cervical region 02/27/2016   Degenerative arthritis of right knee 02/27/2016   Right knee pain  02/24/2016   Left shoulder pain 02/24/2016   Routine general medical examination at a health care facility 12/12/2015   Schatzki's ring    Diarrhea of presumed infectious origin 06/01/2015   Lumbar radiculopathy 03/05/2015   Peripheral neuropathy 01/17/2015   Chest pain with moderate risk for cardiac etiology 05/21/2013   Fibromyalgia 02/21/2011   Essential hypertension 12/04/2006   Hx of diabetes mellitus 10/20/2006   Morbid obesity ([place]) 10/20/2006   MDD (major depressive disorder) ([place]) 10/20/2006   OBSTRUCTIVE SLEEP APNEA 10/20/2006   OSTEOARTHRITIS 10/20/2006    Allergies:  Allergies  Allergen Reactions   Carafate [Sucralfate] Hives, Itching and Other (See Comments)    Angioedema      Sulfonamide Derivatives Rash    Syncope   Medications:  Current Outpatient Medications:    albuterol (PROVENTIL) (2.5 MG/3ML) 0.083% nebulizer solution, Take 3 mLs (2.5 mg total) by nebulization every 6 (six) hours as needed for wheezing or shortness of breath., Disp: 100 mL, Rfl: 1   albuterol (VENTOLIN HFA) 108 (90 Base) MCG/ACT inhaler, INHALE 2 PUFFS INTO THE LUNGS EVERY 6 HOURS AS NEEDED FOR WHEEZING OR SHORTNESS OF BREATH, Disp: 3 each, Rfl: 2   alendronate (FOSAMAX) 70 MG tablet, Take 1 tablet (70 mg total) by mouth every 7 (seven) days. Take with a full glass of water on an empty stomach., Disp: 12 tablet, Rfl: 3   amLODipine (NORVASC) 10 MG tablet, TAKE 1 TABLET EVERY DAY, Disp: 90 tablet, Rfl: 3   Ascorbic Acid (VITAMIN C) 1000 MG tablet, Take 1 tablet by mouth daily., Disp: , Rfl:    benzonatate (TESSALON) 100 MG capsule, Take 1 capsule (100 mg total) by mouth 3 (three) times daily as needed., Disp: 30 capsule, Rfl: 0   buPROPion (WELLBUTRIN XL) 300 MG 24 hr tablet, Take 1 tablet (300 mg total) by mouth daily., Disp: 90 tablet, Rfl: 3   cetirizine (ZYRTEC) 10 MG tablet, Take 1 tablet by mouth daily., Disp: , Rfl:    ferrous sulfate 325 (65 FE) MG tablet, Take 1 tablet by mouth daily  with breakfast., Disp: , Rfl:    fluticasone-salmeterol (ADVAIR) 100-50 MCG/ACT AEPB, Inhale 1 puff into the lungs 2 (two) times daily., Disp: 1 each, Rfl: 3   gabapentin (NEURONTIN) 100 MG capsule, Take 2 capsules (200 mg total) by mouth 2 (two) times daily., Disp: 180 capsule, Rfl: 0   ipratropium (ATROVENT) 0.03 % nasal spray, Place 2 sprays into both nostrils every 12 (twelve) hours. X 5-7 days, Disp: 30 mL, Rfl: 0   lipase/protease/amylase (CREON) 36000 UNITS CPEP capsule, Take 1 capsule by mouth in the morning, at noon, in the evening, and at bedtime., Disp: , Rfl:    Multiple Vitamin (MULTI-VITAMIN) tablet, Take by mouth daily. Take 1 Tablet Daily, Disp: , Rfl:    ondansetron (ZOFRAN) 4 MG tablet, Take 1 tablet (4 mg total) by mouth every 8 (eight) hours as needed for nausea or vomiting., Disp: 20 tablet, Rfl: 0   PARoxetine (PAXIL) 10 MG tablet, Take 1 tablet (10 mg total) by mouth daily., Disp: 90 tablet, Rfl: 3   torsemide (DEMADEX) 20 MG tablet, Take 1 tablet (20 mg total) by mouth  3 (three) times a week. May take an additional '20mg'$  ( 1 tablets) as needed on the other days, Disp: 90 tablet, Rfl: 3   traMADol (ULTRAM) 50 MG tablet, Take 50 mg by mouth every 6 (six) hours as needed., Disp: , Rfl:   Observations/Objective: Patient is well-developed, well-nourished in no acute distress.  Resting comfortably  at home.  Head is normocephalic, atraumatic.  Labored breathing.  Speech is clear and coherent with logical content.  Patient is alert and oriented at baseline.    Assessment and Plan: 1. Shortness of breath  Patient can only speak in 2 word sentences before having to catch her breath.  She seems severely short of breath.  I advised her to be seen in ER and to not drive herself.  She agrees and will find someone to give her a ride.  Follow Up Instructions: I discussed the assessment and treatment plan with the patient. The patient was provided an opportunity to ask questions and  all were answered. The patient agreed with the plan and demonstrated an understanding of the instructions.  A copy of instructions were sent to the patient via MyChart unless otherwise noted below.   The patient was advised to call back or seek an in-person evaluation if the symptoms worsen or if the condition fails to improve as anticipated.  Time:  I spent 5 minutes with the patient via telehealth technology discussing the above problems/concerns.    Carvel Getting, NP

## 2021-10-24 ENCOUNTER — Encounter: Payer: Self-pay | Admitting: Cardiology

## 2021-10-24 NOTE — Assessment & Plan Note (Signed)
She is still obese with an Epworth score that was significantly elevated.  Did not go into details whether she is or is not on CPAP.  Need to confirm because this could explain some of her orthopnea symptoms and exertional dyspnea.

## 2021-10-24 NOTE — Assessment & Plan Note (Signed)
Ultimately, irregular morbid obesity is a major factor in her exertional dyspnea and fatigue.

## 2021-10-24 NOTE — Assessment & Plan Note (Signed)
Blood pressure is high today, but she says at home it is in the 150s over 120s.  She was struggling to get in here in a rush.  She thinks that her blood pressures is probably not indicative of her true pressures.  For now continue amlodipine.  With her having issues with palpitation, if we needed more blood pressure control, would probably consider adding carvedilol.

## 2021-10-24 NOTE — Assessment & Plan Note (Signed)
Evaluate for coronary ischemia with a CPX.

## 2021-10-24 NOTE — Assessment & Plan Note (Signed)
Probably explained by aortic valve calcification/sclerosis but no stenosis.  Echo did not show any other cause for murmur.

## 2021-10-24 NOTE — Assessment & Plan Note (Addendum)
She still short of breath with minimal exertion.  Echo looked pretty on last check, and she seems pretty euvolemic so is unlikely that she has had worsening CHF.-Last echo was in September 2020 with stable from 2021.  Nonischemic Coronary CTA in 2021 as well.  She is morbidly obese, very deconditioned not likely able to do much STEMI of significant activity.  I would actually like to evaluate her with a CPX (cardiopulmonary stress test).  They can help [place] delineate the difference between cardiac, pulmonary and noncardiopulmonary disease (obesity, deconditioning, injury, chronotropic incompetence, etc.).  She also has symptoms that sound somewhat consistent with chronic allergy type conditions.  Will defer to PCP for further evaluation.  She does have some orthopnea which is probably combination of body habitus and potential diastolic dysfunction.  We will add back the torsemide that she was on before..  I would asked that she take at least 2-3 times a week standing dose and take additional 20 mg as needed for swelling worsening dyspnea, PND or orthopnea.

## 2021-10-24 NOTE — Assessment & Plan Note (Signed)
Short little bursts of PAT noted on monitor but not symptomatic.  At this point, no evidence to suggest atrial fibrillation.  Continue to monitor on her Apple Watch.  Low threshold to consider adding beta-blocker.

## 2021-10-27 ENCOUNTER — Ambulatory Visit (HOSPITAL_COMMUNITY): Payer: Medicare HMO | Attending: Cardiology

## 2021-10-27 DIAGNOSIS — I10 Essential (primary) hypertension: Secondary | ICD-10-CM | POA: Diagnosis not present

## 2021-10-27 DIAGNOSIS — R071 Chest pain on breathing: Secondary | ICD-10-CM | POA: Insufficient documentation

## 2021-10-27 DIAGNOSIS — R0609 Other forms of dyspnea: Secondary | ICD-10-CM | POA: Insufficient documentation

## 2021-10-27 DIAGNOSIS — R0600 Dyspnea, unspecified: Secondary | ICD-10-CM | POA: Diagnosis not present

## 2021-10-27 HISTORY — PX: OTHER SURGICAL HISTORY: SHX169

## 2021-10-28 ENCOUNTER — Telehealth (INDEPENDENT_AMBULATORY_CARE_PROVIDER_SITE_OTHER): Payer: Medicare HMO | Admitting: Internal Medicine

## 2021-10-28 ENCOUNTER — Encounter: Payer: Self-pay | Admitting: Internal Medicine

## 2021-10-28 DIAGNOSIS — R0609 Other forms of dyspnea: Secondary | ICD-10-CM | POA: Diagnosis not present

## 2021-10-28 MED ORDER — PREDNISONE 20 MG PO TABS: ORAL_TABLET | ORAL | 0 refills | Status: DC

## 2021-10-28 NOTE — Progress Notes (Signed)
Virtual Visit via Video Note  I connected with [name] on 10/28/21 at 10:40 AM EDT by a video enabled telemedicine application and verified that I am speaking with the correct person using two identifiers.  The patient and the provider were at separate locations throughout the entire encounter. Patient location: home, Provider location: work   I discussed the limitations of evaluation and management by telemedicine and the availability of in person appointments. The patient expressed understanding and agreed to proceed. The patient and the provider were the only parties present for the visit unless noted in HPI below.  History of Present Illness: The patient is a 66 y.o. female with visit for ongoing SOB and cough. Did prednisone pack from ENT and felt better a little but then it was over and she felt bad again. Stress test yesterday which was cut short due to SOB.  Observations/Objective: Appearance: voice nasally, breathing appears normal but short of breath with long sentences, causal grooming  Assessment and Plan: See problem oriented charting  Follow Up Instructions: Rx prednisone and encouraged to resume advair/wixela inhaler for several weeks to month for SOB as this has helped in the past.  I discussed the assessment and treatment plan with the patient. The patient was provided an opportunity to ask questions and all were answered. The patient agreed with the plan and demonstrated an understanding of the instructions.   The patient was advised to call back or seek an in-person evaluation if the symptoms worsen or if the condition fails to improve as anticipated.  Hoyt Koch, MD

## 2021-10-28 NOTE — Assessment & Plan Note (Signed)
Rx prednisone 2 week course with taper to see if this helps. Asked her to resume advair inhaler BID as this helped in the past with getting her back to normal. Recent stress test with significant dyspnea during exam. Prior PFTs reviewed with patient done 12/2020.

## 2021-11-03 NOTE — Progress Notes (Unsigned)
[name] [address] [place] Phone: [phone] Subjective:   [name], am serving as a scribe for Dr. Hulan Saas.  I'm seeing this patient by the request  of:  Hoyt Koch, MD  CC: knee pain   VEL:[id number]  09/08/2021 Patient given injection and tolerated the procedure well.  Patient is looking into a geriatrician.  We discussed potential referral for this.  Discussed icing regimen and home exercises.  Discussed that patient could be a candidate for viscosupplementation if needed.  Discussed which activities to potentially avoid and if any instability need to consider wearing the brace on a more regular basis.  Follow-up with me again in 6 to 8 weeks otherwise  Updated 11/04/2021 [name] is a 66 y.o. female coming in with complaint of knee pain. Knee ready for injection. Having muscle spasm and low back feels like its catching randomly when standing. Has been happening for about 2-3 weeks.       Past Medical History:  Diagnosis Date   Anemia    takes Ferrous Sulfate daily   Anxiety    takes Citalopram daily   Arthritis    Asthma    2004-prior to gastric bypass and no problems since   CHF (congestive heart failure) ([place])    takes Lasix daily as needed   Chronic back pain    spondylolisthesis/stenosis/radiculopathy   Complication of anesthesia yrs ago   slow to wake up   Depression    Diabetes ([place])    DVT (deep venous thrombosis) ([place]) 01/17/2013   past hx. -tx.5-6 yrs ago bilateral legs, occ. sporadic swelling, has IVC filter implanted   Dysrhythmia    "heart tends to flutter"   Fibromyalgia    Fracture    right foot and is in a cam boot   Gallstones    GERD (gastroesophageal reflux disease)    hx of-no meds now   Heart murmur    History of bronchitis 2012 or 2013   History of colon polyps    Hypertension    takes Metoprolol daily   Insomnia    takes Melatonin daily   Pelvic  floor dysfunction    Peripheral neuropathy    Pneumonia 90's   hx of   S/P gastric bypass 2003   Sleep apnea    no cpap used in many yrs after weight lost-no machine now   Urinary frequency    Urinary urgency    Vitamin D deficiency    Past Surgical History:  Procedure Laterality Date   BALLOON DILATION N/A 01/31/2013   Procedure: BALLOON DILATION;  Surgeon: Arta Silence, MD;  Location: [facility];  Service: Endoscopy;  Laterality: N/A;   CARDIAC EVENT MONITOR  03/2019   Predominantly sinus rhythm.  Rates range from 48-124 bpm.  Average 74 bpm.  Frequent short bursts (3-15 beats) PAT/PSVT--not indicated as being symptomatic on diary.  Otherwise rare PACs and PVCs.   CARDIAC EVENT MONITOR  08/2021   14-Day Zio Patch: Predominantly sinus rhythm with rate range 45 to 135 bpm.  Average 73 bpm.  Occasional (1.3%) PACs with rare PVCs and rare PAC couplets.  24 episodes of atrial runs: Fastest was 11 beats at a rate of 184 bpm, longest was 13 beats with a rate of 92 bpm.  No sustained tachyarrhythmias or bradycardia arrhythmias.  No atrial fibrillation or flutter.   CHOLECYSTECTOMY  2007   COLONOSCOPY     CT CTA CORONARY W/CA  SCORE W/CM &/OR WO/CM  09/21/2019   Coronary Calcium Score 0.  Normal RCA dominant coronary anatomy.  CAD RADS 1-minimal nonobstructive CAD (0-24%).  Consider nonatherosclerotic cause for chest pain.  Consider preventative therapy and risk factor modification.   DILATION AND CURETTAGE OF UTERUS  yrs ago   ESOPHAGOGASTRODUODENOSCOPY (EGD) WITH PROPOFOL  03/01/2012   Procedure: ESOPHAGOGASTRODUODENOSCOPY (EGD) WITH PROPOFOL;  Surgeon: Arta Silence, MD;  Location: [facility];  Service: Endoscopy;  Laterality: N/A;   ESOPHAGOGASTRODUODENOSCOPY (EGD) WITH PROPOFOL N/A 01/31/2013   Procedure: ESOPHAGOGASTRODUODENOSCOPY (EGD) WITH PROPOFOL;  Surgeon: Arta Silence, MD;  Location: [facility];  Service: Endoscopy;  Laterality: N/A;   ESOPHAGOGASTRODUODENOSCOPY (EGD) WITH  PROPOFOL N/A 09/02/2015   Procedure: ESOPHAGOGASTRODUODENOSCOPY (EGD) WITH PROPOFOL;  Surgeon: Gatha Mayer, MD;  Location: [facility];  Service: Endoscopy;  Laterality: N/A;   GASTRIC BY-PASS  2004   HERNIA REPAIR  2005   INSERTION OF VENA CAVA FILTER  01/17/2013   inserted 2004- "abdomen"   LIGAMENT REPAIR Right 1987   Rt. knee scope   MAXIMUM ACCESS (MAS)POSTERIOR LUMBAR INTERBODY FUSION (PLIF) 2 LEVEL N/A 06/07/2014   Procedure: L4-5 L5-S1 FOR MAXIMUM ACCESS (MAS) POSTERIOR LUMBAR INTERBODY FUSION ;  Surgeon: Erline Levine, MD;  Location: [facility];  Service: Neurosurgery;  Laterality: N/A;  L4-5 L5-S1 FOR MAXIMUM ACCESS (MAS) POSTERIOR LUMBAR INTERBODY FUSION    TONSILLECTOMY     as child   TRANSTHORACIC ECHOCARDIOGRAM  12/2020   EF 55 to 60%.  No RWMA.  Mild LVH.  GR 1 DD-moderate LA dilation..  Mild MR..  Normal RV size/function.  Normal PAP.  Normal RAP. => Essentially stable from December 2020   Social History   Socioeconomic History   Marital status: Widowed    Spouse name: Not on file   Number of children: 0   Years of education: college   Highest education level: Master's degree (e.g., MA, MS, MEng, MEd, MSW, MBA)  Occupational History   Occupation: retired  Tobacco Use   Smoking status: Former    Packs/day: 0.50    Years: 10.00    Total pack years: 5.00    Types: Cigarettes    Quit date: 04/19/2009    Years since quitting: 12.5   Smokeless tobacco: Never  Substance and Sexual Activity   Alcohol use: Yes    Alcohol/week: 0.0 standard drinks of alcohol    Comment: occ. social- wine-1 drink monthly   Drug use: No   Sexual activity: Not Currently  Other Topics Concern   Not on file  Social History Narrative   Widow.  No kids.  Lives with her mother who is 55.  She does walk with a cane.  She is a retired Investment banker, corporate   She quit smoking in 2003   Social Determinants of Health   Financial Resource Strain: Low Risk  (09/29/2021)   Overall Financial  Resource Strain (CARDIA)    Difficulty of Paying Living Expenses: Not hard at all  Food Insecurity: No Food Insecurity (09/29/2021)   Hunger Vital Sign    Worried About Running Out of Food in the Last Year: Never true    [place] in the Last Year: Never true  Transportation Needs: No Transportation Needs (09/29/2021)   PRAPARE - Hydrologist (Medical): No    Lack of Transportation (Non-Medical): No  Physical Activity: Sufficiently Active (09/29/2021)   Exercise Vital Sign    Days of Exercise per Week: 7 days  Minutes of Exercise per Session: 30 min  Stress: No Stress Concern Present (09/29/2021)   [place]    Feeling of Stress : Not at all  Social Connections: Unknown (09/29/2021)   Social Connection and Isolation Panel [NHANES]    Frequency of Communication with Friends and Family: More than three times a week    Frequency of Social Gatherings with Friends and Family: Once a week    Attends Religious Services: Patient refused    Active Member of Clubs or Organizations: Yes    Attends Archivist Meetings: 1 to 4 times per year    Marital Status: Widowed   Allergies  Allergen Reactions   Carafate [Sucralfate] Hives, Itching and Other (See Comments)    Angioedema      Sulfonamide Derivatives Rash    Syncope   Family History  Problem Relation Age of Onset   Diabetes Mother    Atrial fibrillation Mother    Hypertension Mother    Heart disease Father        Unsure of details   Hypertension Father    Prostate cancer Father    Alzheimer's disease Father    Kidney disease Brother    Healthy Sister    Diabetes Maternal Grandmother    Heart disease Maternal Grandmother    Stroke Maternal Grandfather 58   Hypertension Maternal Grandfather    Hypertension Paternal Grandmother    Alzheimer's disease Paternal Grandmother    Colon cancer Neg Hx    Colon polyps Neg Hx     Stomach cancer Neg Hx    Esophageal cancer Neg Hx    Pancreatic cancer Neg Hx    Liver disease Neg Hx     Current Outpatient Medications (Endocrine & Metabolic):    alendronate (FOSAMAX) 70 MG tablet, Take 1 tablet (70 mg total) by mouth every 7 (seven) days. Take with a full glass of water on an empty stomach.   predniSONE (DELTASONE) 20 MG tablet, Take 1 pill twice a day for 10 days then take 1 pill daily for 5 days then stop.  Current Outpatient Medications (Cardiovascular):    amLODipine (NORVASC) 10 MG tablet, TAKE 1 TABLET EVERY DAY   torsemide (DEMADEX) 20 MG tablet, Take 1 tablet (20 mg total) by mouth 3 (three) times a week. May take an additional '20mg'$  ( 1 tablets) as needed on the other days  Current Outpatient Medications (Respiratory):    albuterol (PROVENTIL) (2.5 MG/3ML) 0.083% nebulizer solution, Take 3 mLs (2.5 mg total) by nebulization every 6 (six) hours as needed for wheezing or shortness of breath.   albuterol (VENTOLIN HFA) 108 (90 Base) MCG/ACT inhaler, INHALE 2 PUFFS INTO THE LUNGS EVERY 6 HOURS AS NEEDED FOR WHEEZING OR SHORTNESS OF BREATH   benzonatate (TESSALON) 100 MG capsule, Take 1 capsule (100 mg total) by mouth 3 (three) times daily as needed.   cetirizine (ZYRTEC) 10 MG tablet, Take 1 tablet by mouth daily.   fluticasone-salmeterol (ADVAIR) 100-50 MCG/ACT AEPB, Inhale 1 puff into the lungs 2 (two) times daily.   ipratropium (ATROVENT) 0.03 % nasal spray, Place 2 sprays into both nostrils every 12 (twelve) hours. X 5-7 days  Current Outpatient Medications (Analgesics):    traMADol (ULTRAM) 50 MG tablet, Take 50 mg by mouth every 6 (six) hours as needed.  Current Outpatient Medications (Hematological):    ferrous sulfate 325 (65 FE) MG tablet, Take 1 tablet by mouth daily with breakfast.  Current Outpatient  Medications (Other):    Ascorbic Acid (VITAMIN C) 1000 MG tablet, Take 1 tablet by mouth daily.   buPROPion (WELLBUTRIN XL) 300 MG 24 hr tablet, Take  1 tablet (300 mg total) by mouth daily.   gabapentin (NEURONTIN) 100 MG capsule, Take 2 capsules (200 mg total) by mouth 2 (two) times daily.   lipase/protease/amylase (CREON) 36000 UNITS CPEP capsule, Take 1 capsule by mouth in the morning, at noon, in the evening, and at bedtime.   Multiple Vitamin (MULTI-VITAMIN) tablet, Take by mouth daily. Take 1 Tablet Daily   ondansetron (ZOFRAN) 4 MG tablet, Take 1 tablet (4 mg total) by mouth every 8 (eight) hours as needed for nausea or vomiting.   PARoxetine (PAXIL) 10 MG tablet, Take 1 tablet (10 mg total) by mouth daily.   Reviewed prior external information including notes and imaging from  primary care provider As well as notes that were available from care everywhere and other healthcare systems.  Past medical history, social, surgical and family history all reviewed in electronic medical record.  No pertanent information unless stated regarding to the chief complaint.   Review of Systems:  No headache, visual changes, nausea, vomiting, diarrhea, constipation, dizziness, abdominal pain, skin rash, fevers, chills, night sweats, weight loss, swollen lymph nodes, body aches, joint swelling, chest pain, shortness of breath, mood changes. POSITIVE muscle aches  Objective  Blood pressure 128/66, pulse 93, height '5\' 5"'$  (1.651 m), weight 239 lb (108.4 kg), SpO2 93 %.   General: No apparent distress alert and oriented x3 mood and affect normal, dressed appropriately.  HEENT: Pupils equal, extraocular movements intact  Respiratory: Patient's speak in full sentences and does not appear short of breath  Cardiovascular: No lower extremity edema, non tender, no erythema  Severe antalgic gait noted.  Patient's right knee does have arthritic changes noted.  No instability noted.  Trace effusion noted. Low back exam significant loss of lordosis.  Does have some mild positive straight leg test on the right side.  Severe tenderness to palpation more pain in the  thoracic spine which was well. Mild midline tenderness is noted at T12.  After informed written and verbal consent, patient was seated on exam table. Right knee was prepped with alcohol swab and utilizing anterolateral approach, patient's right knee space was injected with 48 mg per 3 mL of Monovisc (sodium hyaluronate) in a prefilled syringe was injected easily into the knee through a 22-gauge needle..Patient tolerated the procedure well without immediate complications.   Impression and Recommendations:     The above documentation has been reviewed and is accurate and complete Lyndal Pulley, DO

## 2021-11-04 ENCOUNTER — Ambulatory Visit (INDEPENDENT_AMBULATORY_CARE_PROVIDER_SITE_OTHER): Payer: Medicare HMO

## 2021-11-04 ENCOUNTER — Ambulatory Visit: Payer: Medicare HMO | Admitting: Family Medicine

## 2021-11-04 VITALS — BP 128/66 | HR 93 | Ht 65.0 in | Wt 239.0 lb

## 2021-11-04 DIAGNOSIS — G8929 Other chronic pain: Secondary | ICD-10-CM

## 2021-11-04 DIAGNOSIS — M797 Fibromyalgia: Secondary | ICD-10-CM

## 2021-11-04 DIAGNOSIS — M549 Dorsalgia, unspecified: Secondary | ICD-10-CM

## 2021-11-04 DIAGNOSIS — M25561 Pain in right knee: Secondary | ICD-10-CM | POA: Diagnosis not present

## 2021-11-04 DIAGNOSIS — M546 Pain in thoracic spine: Secondary | ICD-10-CM | POA: Diagnosis not present

## 2021-11-04 DIAGNOSIS — M1711 Unilateral primary osteoarthritis, right knee: Secondary | ICD-10-CM

## 2021-11-04 DIAGNOSIS — M545 Low back pain, unspecified: Secondary | ICD-10-CM | POA: Diagnosis not present

## 2021-11-04 MED ORDER — METHYLPREDNISOLONE ACETATE 40 MG/ML IJ SUSP
40.0000 mg | Freq: Once | INTRAMUSCULAR | Status: AC
  Administered 2021-11-04: 40 mg via INTRAMUSCULAR

## 2021-11-04 MED ORDER — HYALURONAN 88 MG/4ML IX SOSY
88.0000 mg | PREFILLED_SYRINGE | Freq: Once | INTRA_ARTICULAR | Status: AC
  Administered 2021-11-04: 88 mg via INTRA_ARTICULAR

## 2021-11-04 MED ORDER — KETOROLAC TROMETHAMINE 30 MG/ML IJ SOLN
30.0000 mg | Freq: Once | INTRAMUSCULAR | Status: AC
  Administered 2021-11-04: 30 mg via INTRAMUSCULAR

## 2021-11-04 NOTE — Patient Instructions (Addendum)
Good to see you! Xray today Gel injection in right knee  See you again in 8 weeks

## 2021-11-04 NOTE — Assessment & Plan Note (Signed)
I believe the patient is having more fibromyalgia flare of the low back.  We will get x-rays to rule out such things as a compression fracture or insufficiency fractures that could be contributing.  Discussed with patient about posture and ergonomics otherwise.  Follow-up with me again after the Toradol and Depo-Medrol and x-rays in 6 weeks.  Worsening pain seek medical attention immediately.

## 2021-11-04 NOTE — Assessment & Plan Note (Signed)
Chronic, worsening symptoms.  Patient has failed all other conservative therapy.  We will try the viscosupplementation to see how patient responds.  In addition to this may need to consider the possibility of surgical intervention at some point but hopefully not.  Follow-up again in 6 to 12 weeks.

## 2021-11-12 ENCOUNTER — Telehealth: Payer: Self-pay | Admitting: Cardiology

## 2021-11-12 NOTE — Telephone Encounter (Signed)
New Message:        Patient would like to switch from Dr Ellyn Hack to Dr Stanford Breed. Is this alright with you both?

## 2021-11-12 NOTE — Telephone Encounter (Signed)
Fine with me  [facility]

## 2021-11-13 ENCOUNTER — Encounter: Payer: Self-pay | Admitting: Internal Medicine

## 2021-11-13 ENCOUNTER — Ambulatory Visit: Payer: Medicare HMO | Admitting: Family Medicine

## 2021-11-16 ENCOUNTER — Ambulatory Visit (HOSPITAL_BASED_OUTPATIENT_CLINIC_OR_DEPARTMENT_OTHER): Payer: Medicare HMO | Admitting: Family

## 2021-11-17 ENCOUNTER — Telehealth: Payer: Medicare HMO

## 2021-11-23 ENCOUNTER — Encounter: Payer: Self-pay | Admitting: Internal Medicine

## 2021-11-24 ENCOUNTER — Telehealth: Payer: Medicare HMO | Admitting: Physician Assistant

## 2021-11-24 DIAGNOSIS — J019 Acute sinusitis, unspecified: Secondary | ICD-10-CM

## 2021-11-24 DIAGNOSIS — B9689 Other specified bacterial agents as the cause of diseases classified elsewhere: Secondary | ICD-10-CM | POA: Diagnosis not present

## 2021-11-24 MED ORDER — BENZONATATE 100 MG PO CAPS: 100.0000 mg | ORAL_CAPSULE | Freq: Three times a day (TID) | ORAL | 0 refills | Status: DC | PRN

## 2021-11-24 MED ORDER — LEVOCETIRIZINE DIHYDROCHLORIDE 5 MG PO TABS: 5.0000 mg | ORAL_TABLET | Freq: Every evening | ORAL | 0 refills | Status: DC

## 2021-11-24 MED ORDER — DOXYCYCLINE HYCLATE 100 MG PO TABS: 100.0000 mg | ORAL_TABLET | Freq: Two times a day (BID) | ORAL | 0 refills | Status: DC

## 2021-11-24 NOTE — Progress Notes (Signed)
Virtual Visit Consent   [name], you are scheduled for a virtual visit with a [facility] provider today. Just as with appointments in the office, your consent must be obtained to participate. Your consent will be active for this visit and any virtual visit you may have with one of our providers in the next 365 days. If you have a MyChart account, a copy of this consent can be sent to you electronically.  As this is a virtual visit, video technology does not allow for your provider to perform a traditional examination. This may limit your provider's ability to fully assess your condition. If your provider identifies any concerns that need to be evaluated in person or the need to arrange testing (such as labs, EKG, etc.), we will make arrangements to do so. Although advances in technology are sophisticated, we cannot ensure that it will always work on either your end or our end. If the connection with a video visit is poor, the visit may have to be switched to a telephone visit. With either a video or telephone visit, we are not always able to ensure that we have a secure connection.  By engaging in this virtual visit, you consent to the provision of healthcare and authorize for your insurance to be billed (if applicable) for the services provided during this visit. Depending on your insurance coverage, you may receive a charge related to this service.  I need to obtain your verbal consent now. Are you willing to proceed with your visit today? WILL SCHIER has provided verbal consent on 11/24/2021 for a virtual visit (video or telephone). Leeanne Rio, [place]  Date: 11/24/2021 12:56 PM  Virtual Visit via Video Note   I, Leeanne Rio, connected with  [name]  ([id number], 09-05-1955) on 11/24/21 at  1:00 PM EDT by a video-enabled telemedicine application and verified that I am speaking with the correct person using two identifiers.  Location: Patient: Virtual  Visit Location Patient: Home Provider: Virtual Visit Location Provider: Home Office   I discussed the limitations of evaluation and management by telemedicine and the availability of in person appointments. The patient expressed understanding and agreed to proceed.    History of Present Illness: [name] is a 66 y.o. who identifies as a female who was assigned female at birth, and is being seen today for concern of a sinusitis secondary to her chronic allergy symptoms, despite taking Zyrtec and using saline rinse daily. Over the past week noting substantial head congestion, chest congestion, productive cough and sinus pain. Has been taking Tylenol OTC. Home COVID test negative.  BP today at 126/84.  HPI: HPI  Problems:  Patient Active Problem List   Diagnosis Date Noted   Abnormal sense of taste 10/16/2021   Community acquired pneumonia 07/23/2021   Greater trochanteric bursitis, right 06/12/2021   Acromioclavicular arthrosis 06/12/2021   Pes anserinus bursitis of right knee 04/24/2021   Left hip pain 03/06/2021   Vitamin D deficiency 02/04/2021   Chest pain on breathing 08/11/2020   Closed fracture of left distal radius 05/01/2020   Recurrent Clostridioides difficile infection 02/28/2020   Pyelonephritis 02/28/2020   Abnormal urine odor 08/10/2019   Gastrointestinal complaint 07/05/2019   Muscle strain of gluteal region, right, initial encounter 05/11/2019   DOE (dyspnea on exertion) 03/13/2019   Rapid heartbeat 71/69/6789   Systolic ejection murmur 38/01/1750   Leg swelling 10/09/2018   Insomnia 05/19/2018   Frequent refractory urinary tract infections 05/05/2018  Right leg pain 12/16/2017   History of deep vein thrombosis (DVT) of lower extremity 12/12/2017   Cavovarus deformity of foot, acquired, unspecified laterality 04/07/2017   S/P gastric bypass 08/18/2016   Muscle cramps 07/09/2016   Adjustment disorder with mixed anxiety and depressed mood 03/05/2016    Fatigue 03/05/2016   Cervical disc disorder with radiculopathy of cervical region 02/27/2016   Degenerative arthritis of right knee 02/27/2016   Right knee pain 02/24/2016   Left shoulder pain 02/24/2016   Routine general medical examination at a health care facility 12/12/2015   Schatzki's ring    Diarrhea of presumed infectious origin 06/01/2015   Lumbar radiculopathy 03/05/2015   Peripheral neuropathy 01/17/2015   Chest pain with moderate risk for cardiac etiology 05/21/2013   Fibromyalgia 02/21/2011   Essential hypertension 12/04/2006   Hx of diabetes mellitus 10/20/2006   Morbid obesity ([place]) 10/20/2006   MDD (major depressive disorder) ([place]) 10/20/2006   OBSTRUCTIVE SLEEP APNEA 10/20/2006   OSTEOARTHRITIS 10/20/2006    Allergies:  Allergies  Allergen Reactions   Carafate [Sucralfate] Hives, Itching and Other (See Comments)    Angioedema      Sulfonamide Derivatives Rash    Syncope   Medications:  Current Outpatient Medications:    benzonatate (TESSALON) 100 MG capsule, Take 1 capsule (100 mg total) by mouth 3 (three) times daily as needed for cough., Disp: 30 capsule, Rfl: 0   doxycycline (VIBRA-TABS) 100 MG tablet, Take 1 tablet (100 mg total) by mouth 2 (two) times daily., Disp: 20 tablet, Rfl: 0   levocetirizine (XYZAL) 5 MG tablet, Take 1 tablet (5 mg total) by mouth every evening., Disp: 30 tablet, Rfl: 0   albuterol (PROVENTIL) (2.5 MG/3ML) 0.083% nebulizer solution, Take 3 mLs (2.5 mg total) by nebulization every 6 (six) hours as needed for wheezing or shortness of breath., Disp: 100 mL, Rfl: 1   albuterol (VENTOLIN HFA) 108 (90 Base) MCG/ACT inhaler, INHALE 2 PUFFS INTO THE LUNGS EVERY 6 HOURS AS NEEDED FOR WHEEZING OR SHORTNESS OF BREATH, Disp: 3 each, Rfl: 2   alendronate (FOSAMAX) 70 MG tablet, Take 1 tablet (70 mg total) by mouth every 7 (seven) days. Take with a full glass of water on an empty stomach., Disp: 12 tablet, Rfl: 3   amLODipine (NORVASC) 10 MG  tablet, TAKE 1 TABLET EVERY DAY, Disp: 90 tablet, Rfl: 3   Ascorbic Acid (VITAMIN C) 1000 MG tablet, Take 1 tablet by mouth daily., Disp: , Rfl:    buPROPion (WELLBUTRIN XL) 300 MG 24 hr tablet, Take 1 tablet (300 mg total) by mouth daily., Disp: 90 tablet, Rfl: 3   ferrous sulfate 325 (65 FE) MG tablet, Take 1 tablet by mouth daily with breakfast., Disp: , Rfl:    fluticasone-salmeterol (ADVAIR) 100-50 MCG/ACT AEPB, Inhale 1 puff into the lungs 2 (two) times daily., Disp: 1 each, Rfl: 3   gabapentin (NEURONTIN) 100 MG capsule, Take 2 capsules (200 mg total) by mouth 2 (two) times daily., Disp: 180 capsule, Rfl: 0   ipratropium (ATROVENT) 0.03 % nasal spray, Place 2 sprays into both nostrils every 12 (twelve) hours. X 5-7 days, Disp: 30 mL, Rfl: 0   lipase/protease/amylase (CREON) 36000 UNITS CPEP capsule, Take 1 capsule by mouth in the morning, at noon, in the evening, and at bedtime., Disp: , Rfl:    Multiple Vitamin (MULTI-VITAMIN) tablet, Take by mouth daily. Take 1 Tablet Daily, Disp: , Rfl:    ondansetron (ZOFRAN) 4 MG tablet, Take 1 tablet (4 mg  total) by mouth every 8 (eight) hours as needed for nausea or vomiting., Disp: 20 tablet, Rfl: 0   PARoxetine (PAXIL) 10 MG tablet, Take 1 tablet (10 mg total) by mouth daily., Disp: 90 tablet, Rfl: 3   torsemide (DEMADEX) 20 MG tablet, Take 1 tablet (20 mg total) by mouth 3 (three) times a week. May take an additional '20mg'$  ( 1 tablets) as needed on the other days, Disp: 90 tablet, Rfl: 3   traMADol (ULTRAM) 50 MG tablet, Take 50 mg by mouth every 6 (six) hours as needed., Disp: , Rfl:   Observations/Objective: Patient is well-developed, well-nourished in no acute distress.  Resting comfortably at home.  Head is normocephalic, atraumatic.  No labored breathing. Speech is clear and coherent with logical content.  Patient is alert and oriented at baseline.   Assessment and Plan: 1. Acute bacterial sinusitis - levocetirizine (XYZAL) 5 MG tablet;  Take 1 tablet (5 mg total) by mouth every evening.  Dispense: 30 tablet; Refill: 0 - benzonatate (TESSALON) 100 MG capsule; Take 1 capsule (100 mg total) by mouth 3 (three) times daily as needed for cough.  Dispense: 30 capsule; Refill: 0 - doxycycline (VIBRA-TABS) 100 MG tablet; Take 1 tablet (100 mg total) by mouth 2 (two) times daily.  Dispense: 20 tablet; Refill: 0  Rx Doxycycline.  Increase fluids.  Rest.  Saline nasal spray.  Probiotic.  Mucinex as directed.  Humidifier in bedroom. Stop Zyrtec and start Xyzal once daily.  Call or return to clinic if symptoms are not improving.  Follow Up Instructions: I discussed the assessment and treatment plan with the patient. The patient was provided an opportunity to ask questions and all were answered. The patient agreed with the plan and demonstrated an understanding of the instructions.  A copy of instructions were sent to the patient via MyChart unless otherwise noted below.   The patient was advised to call back or seek an in-person evaluation if the symptoms worsen or if the condition fails to improve as anticipated.  Time:  I spent 10 minutes with the patient via telehealth technology discussing the above problems/concerns.    Leeanne Rio, PA-C

## 2021-11-24 NOTE — Patient Instructions (Signed)
[name], thank you for joining Leeanne Rio, PA-C for today's virtual visit.  While this provider is not your primary care provider (PCP), if your PCP is located in our provider database this encounter information will be shared with them immediately following your visit.  Consent: (Patient) [name] provided verbal consent for this virtual visit at the beginning of the encounter.  Current Medications:  Current Outpatient Medications:    albuterol (PROVENTIL) (2.5 MG/3ML) 0.083% nebulizer solution, Take 3 mLs (2.5 mg total) by nebulization every 6 (six) hours as needed for wheezing or shortness of breath., Disp: 100 mL, Rfl: 1   albuterol (VENTOLIN HFA) 108 (90 Base) MCG/ACT inhaler, INHALE 2 PUFFS INTO THE LUNGS EVERY 6 HOURS AS NEEDED FOR WHEEZING OR SHORTNESS OF BREATH, Disp: 3 each, Rfl: 2   alendronate (FOSAMAX) 70 MG tablet, Take 1 tablet (70 mg total) by mouth every 7 (seven) days. Take with a full glass of water on an empty stomach., Disp: 12 tablet, Rfl: 3   amLODipine (NORVASC) 10 MG tablet, TAKE 1 TABLET EVERY DAY, Disp: 90 tablet, Rfl: 3   Ascorbic Acid (VITAMIN C) 1000 MG tablet, Take 1 tablet by mouth daily., Disp: , Rfl:    benzonatate (TESSALON) 100 MG capsule, Take 1 capsule (100 mg total) by mouth 3 (three) times daily as needed., Disp: 30 capsule, Rfl: 0   buPROPion (WELLBUTRIN XL) 300 MG 24 hr tablet, Take 1 tablet (300 mg total) by mouth daily., Disp: 90 tablet, Rfl: 3   cetirizine (ZYRTEC) 10 MG tablet, Take 1 tablet by mouth daily., Disp: , Rfl:    ferrous sulfate 325 (65 FE) MG tablet, Take 1 tablet by mouth daily with breakfast., Disp: , Rfl:    fluticasone-salmeterol (ADVAIR) 100-50 MCG/ACT AEPB, Inhale 1 puff into the lungs 2 (two) times daily., Disp: 1 each, Rfl: 3   gabapentin (NEURONTIN) 100 MG capsule, Take 2 capsules (200 mg total) by mouth 2 (two) times daily., Disp: 180 capsule, Rfl: 0   ipratropium (ATROVENT) 0.03 % nasal spray,  Place 2 sprays into both nostrils every 12 (twelve) hours. X 5-7 days, Disp: 30 mL, Rfl: 0   lipase/protease/amylase (CREON) 36000 UNITS CPEP capsule, Take 1 capsule by mouth in the morning, at noon, in the evening, and at bedtime., Disp: , Rfl:    Multiple Vitamin (MULTI-VITAMIN) tablet, Take by mouth daily. Take 1 Tablet Daily, Disp: , Rfl:    ondansetron (ZOFRAN) 4 MG tablet, Take 1 tablet (4 mg total) by mouth every 8 (eight) hours as needed for nausea or vomiting., Disp: 20 tablet, Rfl: 0   PARoxetine (PAXIL) 10 MG tablet, Take 1 tablet (10 mg total) by mouth daily., Disp: 90 tablet, Rfl: 3   predniSONE (DELTASONE) 20 MG tablet, Take 1 pill twice a day for 10 days then take 1 pill daily for 5 days then stop., Disp: 25 tablet, Rfl: 0   torsemide (DEMADEX) 20 MG tablet, Take 1 tablet (20 mg total) by mouth 3 (three) times a week. May take an additional '20mg'$  ( 1 tablets) as needed on the other days, Disp: 90 tablet, Rfl: 3   traMADol (ULTRAM) 50 MG tablet, Take 50 mg by mouth every 6 (six) hours as needed., Disp: , Rfl:    Medications ordered in this encounter:  No orders of the defined types were placed in this encounter.    *If you need refills on other medications prior to your next appointment, please contact your pharmacy*  Follow-Up: Call back or seek an in-person evaluation if the symptoms worsen or if the condition fails to improve as anticipated.  Other Instructions Please take antibiotic as directed.  Increase fluid intake.  Use Saline nasal spray.  Take a daily multivitamin. Stop your Zyrtec and start the Xyzal once daily.  Place a humidifier in the bedroom.  Please call or return clinic if symptoms are not improving.  Sinusitis Sinusitis is redness, soreness, and swelling (inflammation) of the paranasal sinuses. Paranasal sinuses are air pockets within the bones of your face (beneath the eyes, the middle of the forehead, or above the eyes). In healthy paranasal sinuses, mucus is  able to drain out, and air is able to circulate through them by way of your nose. However, when your paranasal sinuses are inflamed, mucus and air can become trapped. This can allow bacteria and other germs to grow and cause infection. Sinusitis can develop quickly and last only a short time (acute) or continue over a long period (chronic). Sinusitis that lasts for more than 12 weeks is considered chronic.  CAUSES  Causes of sinusitis include: Allergies. Structural abnormalities, such as displacement of the cartilage that separates your nostrils (deviated septum), which can decrease the air flow through your nose and sinuses and affect sinus drainage. Functional abnormalities, such as when the small hairs (cilia) that line your sinuses and help remove mucus do not work properly or are not present. SYMPTOMS  Symptoms of acute and chronic sinusitis are the same. The primary symptoms are pain and pressure around the affected sinuses. Other symptoms include: Upper toothache. Earache. Headache. Bad breath. Decreased sense of smell and taste. A cough, which worsens when you are lying flat. Fatigue. Fever. Thick drainage from your nose, which often is green and may contain pus (purulent). Swelling and warmth over the affected sinuses. DIAGNOSIS  Your caregiver will perform a physical exam. During the exam, your caregiver may: Look in your nose for signs of abnormal growths in your nostrils (nasal polyps). Tap over the affected sinus to check for signs of infection. View the inside of your sinuses (endoscopy) with a special imaging device with a light attached (endoscope), which is inserted into your sinuses. If your caregiver suspects that you have chronic sinusitis, one or more of the following tests may be recommended: Allergy tests. Nasal culture A sample of mucus is taken from your nose and sent to a lab and screened for bacteria. Nasal cytology A sample of mucus is taken from your nose and  examined by your caregiver to determine if your sinusitis is related to an allergy. TREATMENT  Most cases of acute sinusitis are related to a viral infection and will resolve on their own within 10 days. Sometimes medicines are prescribed to help relieve symptoms (pain medicine, decongestants, nasal steroid sprays, or saline sprays).  However, for sinusitis related to a bacterial infection, your caregiver will prescribe antibiotic medicines. These are medicines that will help kill the bacteria causing the infection.  Rarely, sinusitis is caused by a fungal infection. In theses cases, your caregiver will prescribe antifungal medicine. For some cases of chronic sinusitis, surgery is needed. Generally, these are cases in which sinusitis recurs more than 3 times per year, despite other treatments. HOME CARE INSTRUCTIONS  Drink plenty of water. Water helps thin the mucus so your sinuses can drain more easily. Use a humidifier. Inhale steam 3 to 4 times a day (for example, sit in the bathroom with the shower running). Apply a warm,  moist washcloth to your face 3 to 4 times a day, or as directed by your caregiver. Use saline nasal sprays to help moisten and clean your sinuses. Take over-the-counter or prescription medicines for pain, discomfort, or fever only as directed by your caregiver. SEEK IMMEDIATE MEDICAL CARE IF: You have increasing pain or severe headaches. You have nausea, vomiting, or drowsiness. You have swelling around your face. You have vision problems. You have a stiff neck. You have difficulty breathing. MAKE SURE YOU:  Understand these instructions. Will watch your condition. Will get help right away if you are not doing well or get worse. Document Released: 04/05/2005 Document Revised: 06/28/2011 Document Reviewed: 04/20/2011 [facility] Patient Information 2014 [place], [place].    If you have been instructed to have an in-person evaluation today at a local Urgent Care facility,  please use the link below. It will take you to a list of all of our available [facility], including address, phone number and hours of operation. Please do not delay care.  [facility]  If you or a family member do not have a primary care provider, use the link below to schedule a visit and establish care. When you choose a [facility] primary care physician or advanced practice provider, you gain a long-term partner in health. Find a Primary Care Provider  Learn more about [facility] in-office and virtual care options: [facility]

## 2021-11-25 ENCOUNTER — Encounter (INDEPENDENT_AMBULATORY_CARE_PROVIDER_SITE_OTHER): Payer: Self-pay

## 2021-12-05 NOTE — Progress Notes (Deleted)
Cardiology Office Note:    Date:  12/05/2021   ID:  [name], DOB Aug 29, 1955, MRN [id number]  PCP:  Hoyt Koch, MD   [facility] Cardiologist:  Glenetta Hew, MD Cardiology APP:  Ledora Bottcher, PA { Click to update primary MD,subspecialty MD or APP then REFRESH:1}    Referring MD: Hoyt Koch, *   No chief complaint on file. ***  History of Present Illness:    [name] is a 66 y.o. female with a hx of bilateral DVTs s/p IVC filter 2009, no longer on anticoagulation therapy, hypertension, fatigue, OSA on CPAP, and documented sedentary lifestyle.  She has a history of gastric bypass but gained a considerable amount of weight back.  She has been deconditioned in the past likely a result of morbid obesity.  Hypertension has been controlled with amlodipine.  She has followed with Dr. Ellyn Hack last seen in 2021 reporting chest tightness and shortness of breath with ambulation.  Cardiac CT 09/21/2019 with a calcium score of 117 which was 2 percentile for age and sex matched controls, minimal nonobstructive CAD.    She was seen by Jory Sims, NP 10/15/2019 and was awaiting her sleep study at that time.  Suspected that OSA was contributing to her dyspnea on exertion.  Echocardiogram in September 2022 with LVEF 55 to 73%, grade 1 diastolic dysfunction, moderately dilated left atrium, mild MR, and 39 mm ascending aorta.   She had an ER visit 07/17/2021 for shortness of breath, cough and congestion, COVID-negative.  She apparently has been on multiple antibiotics and has a history of C. difficile.  She was seen by PCP on 07/22/2021 for this. She is in a trial for apple watches and Afib. Her watch alerted her to Afib and she was advised to see her cardiologist.  I saw her for this in May 2023 and ordered a heart monitor.  In review of her heart monitor, no atrial fibrillation was found.  She was not started on anticoagulation.  She  followed up with Dr. Ellyn Hack in May 2023 with complaints of dyspnea.  Torsemide was adjusted to 20 mg three times weekly.   She underwent CPX which did not show a clear cardiopulmonary limitation.   She has requested to switch from Dr. Ellyn Hack to Dr. Stanford Breed.   She presents today for routine follow-up.     History of bilateral DVTs IVC filter in place since 2009 No longer on anticoagulation   OSA   Hypertension PTA amlodipine   Hyperlipidemia Need an updated lipid panel I do not see a statin medication listed Unclear who follows this     Past Medical History:  Diagnosis Date   Anemia    takes Ferrous Sulfate daily   Anxiety    takes Citalopram daily   Arthritis    Asthma    2004-prior to gastric bypass and no problems since   CHF (congestive heart failure) ([place])    takes Lasix daily as needed   Chronic back pain    spondylolisthesis/stenosis/radiculopathy   Complication of anesthesia yrs ago   slow to wake up   Depression    Diabetes ([place])    DVT (deep venous thrombosis) ([place]) 01/17/2013   past hx. -tx.5-6 yrs ago bilateral legs, occ. sporadic swelling, has IVC filter implanted   Dysrhythmia    "heart tends to flutter"   Fibromyalgia    Fracture    right foot and is in a cam boot   Gallstones  GERD (gastroesophageal reflux disease)    hx of-no meds now   Heart murmur    History of bronchitis 2012 or 2013   History of colon polyps    Hypertension    takes Metoprolol daily   Insomnia    takes Melatonin daily   Pelvic floor dysfunction    Peripheral neuropathy    Pneumonia 90's   hx of   S/P gastric bypass 2003   Sleep apnea    no cpap used in many yrs after weight lost-no machine now   Urinary frequency    Urinary urgency    Vitamin D deficiency     Past Surgical History:  Procedure Laterality Date   BALLOON DILATION N/A 01/31/2013   Procedure: BALLOON DILATION;  Surgeon: Arta Silence, MD;  Location: [facility];  Service: Endoscopy;   Laterality: N/A;   CARDIAC EVENT MONITOR  03/2019   Predominantly sinus rhythm.  Rates range from 48-124 bpm.  Average 74 bpm.  Frequent short bursts (3-15 beats) PAT/PSVT--not indicated as being symptomatic on diary.  Otherwise rare PACs and PVCs.   CARDIAC EVENT MONITOR  08/2021   14-Day Zio Patch: Predominantly sinus rhythm with rate range 45 to 135 bpm.  Average 73 bpm.  Occasional (1.3%) PACs with rare PVCs and rare PAC couplets.  24 episodes of atrial runs: Fastest was 11 beats at a rate of 184 bpm, longest was 13 beats with a rate of 92 bpm.  No sustained tachyarrhythmias or bradycardia arrhythmias.  No atrial fibrillation or flutter.   CHOLECYSTECTOMY  2007   COLONOSCOPY     CT CTA CORONARY W/CA SCORE W/CM &/OR WO/CM  09/21/2019   Coronary Calcium Score 0.  Normal RCA dominant coronary anatomy.  CAD RADS 1-minimal nonobstructive CAD (0-24%).  Consider nonatherosclerotic cause for chest pain.  Consider preventative therapy and risk factor modification.   DILATION AND CURETTAGE OF UTERUS  yrs ago   ESOPHAGOGASTRODUODENOSCOPY (EGD) WITH PROPOFOL  03/01/2012   Procedure: ESOPHAGOGASTRODUODENOSCOPY (EGD) WITH PROPOFOL;  Surgeon: Arta Silence, MD;  Location: [facility];  Service: Endoscopy;  Laterality: N/A;   ESOPHAGOGASTRODUODENOSCOPY (EGD) WITH PROPOFOL N/A 01/31/2013   Procedure: ESOPHAGOGASTRODUODENOSCOPY (EGD) WITH PROPOFOL;  Surgeon: Arta Silence, MD;  Location: [facility];  Service: Endoscopy;  Laterality: N/A;   ESOPHAGOGASTRODUODENOSCOPY (EGD) WITH PROPOFOL N/A 09/02/2015   Procedure: ESOPHAGOGASTRODUODENOSCOPY (EGD) WITH PROPOFOL;  Surgeon: Gatha Mayer, MD;  Location: [facility];  Service: Endoscopy;  Laterality: N/A;   GASTRIC BY-PASS  2004   HERNIA REPAIR  2005   INSERTION OF VENA CAVA FILTER  01/17/2013   inserted 2004- "abdomen"   LIGAMENT REPAIR Right 1987   Rt. knee scope   MAXIMUM ACCESS (MAS)POSTERIOR LUMBAR INTERBODY FUSION (PLIF) 2 LEVEL N/A 06/07/2014    Procedure: L4-5 L5-S1 FOR MAXIMUM ACCESS (MAS) POSTERIOR LUMBAR INTERBODY FUSION ;  Surgeon: Erline Levine, MD;  Location: [facility];  Service: Neurosurgery;  Laterality: N/A;  L4-5 L5-S1 FOR MAXIMUM ACCESS (MAS) POSTERIOR LUMBAR INTERBODY FUSION    TONSILLECTOMY     as child   TRANSTHORACIC ECHOCARDIOGRAM  12/2020   EF 55 to 60%.  No RWMA.  Mild LVH.  GR 1 DD-moderate LA dilation..  Mild MR..  Normal RV size/function.  Normal PAP.  Normal RAP. => Essentially stable from December 2020    Current Medications: No outpatient medications have been marked as taking for the 12/17/21 encounter (Appointment) with Ledora Bottcher, [place].     Allergies:   Carafate [sucralfate] and Sulfonamide derivatives  Social History   Socioeconomic History   Marital status: Widowed    Spouse name: Not on file   Number of children: 0   Years of education: college   Highest education level: Master's degree (e.g., MA, MS, MEng, MEd, MSW, MBA)  Occupational History   Occupation: retired  Tobacco Use   Smoking status: Former    Packs/day: 0.50    Years: 10.00    Total pack years: 5.00    Types: Cigarettes    Quit date: 04/19/2009    Years since quitting: 12.6   Smokeless tobacco: Never  Substance and Sexual Activity   Alcohol use: Yes    Alcohol/week: 0.0 standard drinks of alcohol    Comment: occ. social- wine-1 drink monthly   Drug use: No   Sexual activity: Not Currently  Other Topics Concern   Not on file  Social History Narrative   Widow.  No kids.  Lives with her mother who is 29.  She does walk with a cane.  She is a retired Investment banker, corporate   She quit smoking in 2003   Social Determinants of Health   Financial Resource Strain: Low Risk  (09/29/2021)   Overall Financial Resource Strain (CARDIA)    Difficulty of Paying Living Expenses: Not hard at all  Food Insecurity: No Food Insecurity (09/29/2021)   Hunger Vital Sign    Worried About Running Out of Food in the Last Year: Never true     [place] in the Last Year: Never true  Transportation Needs: No Transportation Needs (09/29/2021)   PRAPARE - Hydrologist (Medical): No    Lack of Transportation (Non-Medical): No  Physical Activity: Sufficiently Active (09/29/2021)   Exercise Vital Sign    Days of Exercise per Week: 7 days    Minutes of Exercise per Session: 30 min  Stress: No Stress Concern Present (09/29/2021)   [place]    Feeling of Stress : Not at all  Social Connections: Unknown (09/29/2021)   Social Connection and Isolation Panel [NHANES]    Frequency of Communication with Friends and Family: More than three times a week    Frequency of Social Gatherings with Friends and Family: Once a week    Attends Religious Services: Patient refused    Active Member of Clubs or Organizations: Yes    Attends Archivist Meetings: 1 to 4 times per year    Marital Status: Widowed     Family History: The patient's ***family history includes Alzheimer's disease in her father and paternal grandmother; Atrial fibrillation in her mother; Diabetes in her maternal grandmother and mother; Healthy in her sister; Heart disease in her father and maternal grandmother; Hypertension in her father, maternal grandfather, mother, and paternal grandmother; Kidney disease in her brother; Prostate cancer in her father; Stroke (age of onset: 58) in her maternal grandfather. There is no history of Colon cancer, Colon polyps, Stomach cancer, Esophageal cancer, Pancreatic cancer, or Liver disease.  ROS:   Please see the history of present illness.    *** All other systems reviewed and are negative.  EKGs/Labs/Other Studies Reviewed:    The following studies were reviewed today: ***  EKG:  EKG is *** ordered today.  The ekg ordered today demonstrates ***  Recent Labs: 07/17/2021: B Natriuretic Peptide 91.4 10/13/2021: ALT 52; BUN 20;  Creatinine, Ser 1.17; Hemoglobin 12.1; Platelets 260.0; Potassium 3.7; Sodium 142  Recent Lipid Panel  Component Value Date/Time   CHOL 146 01/02/2019 1403   TRIG 66.0 01/02/2019 1403   TRIG 70 02/18/2006 0933   HDL 73.40 01/02/2019 1403   CHOLHDL 2 01/02/2019 1403   VLDL 13.2 01/02/2019 1403   LDLCALC 60 01/02/2019 1403     Risk Assessment/Calculations:   {Does this patient have ATRIAL FIBRILLATION?:[id number]}  No BP recorded.  {Refresh Note OR Click here to enter BP  :1}***         Physical Exam:    VS:  There were no vitals taken for this visit.    Wt Readings from Last 3 Encounters:  11/04/21 239 lb (108.4 kg)  10/13/21 227 lb 9.6 oz (103.2 kg)  10/07/21 231 lb (104.8 kg)     GEN: *** Well nourished, well developed in no acute distress HEENT: Normal NECK: No JVD; No carotid bruits LYMPHATICS: No lymphadenopathy CARDIAC: ***RRR, no murmurs, rubs, gallops RESPIRATORY:  Clear to auscultation without rales, wheezing or rhonchi  ABDOMEN: Soft, non-tender, non-distended MUSCULOSKELETAL:  No edema; No deformity  SKIN: Warm and dry NEUROLOGIC:  Alert and oriented x 3 PSYCHIATRIC:  Normal affect   ASSESSMENT:    No diagnosis found. PLAN:    In order of problems listed above:  ***      {Are you ordering a CV Procedure (e.g. stress test, cath, DCCV, TEE, etc)?   Press F2        :[id number]}    Medication Adjustments/Labs and Tests Ordered: Current medicines are reviewed at length with the patient today.  Concerns regarding medicines are outlined above.  No orders of the defined types were placed in this encounter.  No orders of the defined types were placed in this encounter.   There are no Patient Instructions on file for this visit.   Signed, Ledora Bottcher, [place]  12/05/2021 11:41 AM    [facility]

## 2021-12-17 ENCOUNTER — Ambulatory Visit: Payer: Medicare HMO | Admitting: Physician Assistant

## 2021-12-22 ENCOUNTER — Other Ambulatory Visit: Payer: Self-pay | Admitting: Internal Medicine

## 2021-12-22 NOTE — Telephone Encounter (Signed)
Check [name] registry last filled Tramadol 10/20/2021.Marland KitchenJohny [name]

## 2021-12-23 ENCOUNTER — Encounter: Payer: Self-pay | Admitting: Cardiology

## 2021-12-23 ENCOUNTER — Ambulatory Visit: Payer: Medicare HMO | Attending: Cardiology | Admitting: Cardiology

## 2021-12-23 VITALS — BP 138/80 | HR 71 | Ht 64.0 in | Wt 242.0 lb

## 2021-12-23 DIAGNOSIS — R5383 Other fatigue: Secondary | ICD-10-CM

## 2021-12-23 DIAGNOSIS — R400 Somnolence: Secondary | ICD-10-CM | POA: Diagnosis not present

## 2021-12-23 DIAGNOSIS — R0609 Other forms of dyspnea: Secondary | ICD-10-CM | POA: Diagnosis not present

## 2021-12-23 DIAGNOSIS — G479 Sleep disorder, unspecified: Secondary | ICD-10-CM | POA: Diagnosis not present

## 2021-12-23 DIAGNOSIS — I10 Essential (primary) hypertension: Secondary | ICD-10-CM

## 2021-12-23 DIAGNOSIS — R079 Chest pain, unspecified: Secondary | ICD-10-CM | POA: Diagnosis not present

## 2021-12-23 DIAGNOSIS — G4733 Obstructive sleep apnea (adult) (pediatric): Secondary | ICD-10-CM | POA: Diagnosis not present

## 2021-12-23 NOTE — Patient Instructions (Addendum)
Medication Instructions:  No changes *If you need a refill on your cardiac medications before your next appointment, please call your pharmacy*   Lab Work: Not needed   Testing/Procedures:schedule for sleep study Your physician has recommended that you have a  split night sleep study. This test records several body functions during sleep, including: brain activity, eye movement, oxygen and carbon dioxide blood levels, heart rate and rhythm, breathing rate and rhythm, the flow of air through your mouth and nose, snoring, body muscle movements, and chest and belly movement.  Follow-Up: At [facility], you and your health needs are our priority.  As part of our continuing mission to provide you with exceptional heart care, we have created designated Provider Care Teams.  These Care Teams include your primary Cardiologist (physician) and Advanced Practice Providers (APPs -  Physician Assistants and Nurse Practitioners) who all work together to provide you with the care you need, when you need it.  Your next appointment:   12 month(s)  The format for your next appointment:   In Person  Provider:   Glenetta Hew, MD     Other Instructions  Can do water aerobics and /or water walking  Important Information About Sugar

## 2021-12-23 NOTE — Progress Notes (Signed)
Primary Care Provider: Hoyt Koch, MD [place] cardiologist: Glenetta Hew, MD Electrophysiologist: None  Clinic Note: Chief Complaint  Patient presents with   Follow-up    1-2 months. - Test Results -- CPX   Shortness of Breath   Edema    ===================================  ASSESSMENT/PLAN   Problem List Items Addressed This Visit       Cardiology Problems   Essential hypertension (Chronic)    With concern for diastolic dysfunction playing a role in her dyspnea, BP control is paramount.  No beta blocker due to fatigue.  On amlodipine.  Low threshold to consider ARB along with standing dose of Demadex (althoiugh only taking 3 x per week)        Other   Chest pain with low risk for cardiac etiology (Chronic)    She has this chronic intermittent episodes of chest discomfort which is random places of the chest, sometimes worse with breathing.  Not exertional in nature.  Not anginal.  She has had an nonischemic CPX and coronary CTA.  There is some possibility of microvascular disease, but this would not be resting symptoms this would be exertional.  Treatment here is also weight loss and exercise.      DOE (dyspnea on exertion) - Primary    Echocardiogram would not explain dyspnea based on minimal grade 1 diastolic dysfunction.  I suspect that she does have some worsening diastolic function with exertion as suggested by CPX, but major contributing will was more related to her obesity and deconditioning.  Continue to treat blood pressure, continue to exercise.  Diet and exercise and weight loss.      Relevant Orders   Split night study   Fatigue   Relevant Orders   Split night study   Morbid obesity ([place]) (Chronic)    Morbid obesity.  Weight loss is key.  This is the key point of her dyspnea is obesity and deconditioning.  We talked about exercise.  I think the best option for her is going to be seated exercises but also water exercises.  We  talked about water walking and water aerobics.  I basically gave her a pep talk for the majority of the visit convincing that she needs to get back into exercising and try to lose weight.  She does need to do some walking for weightbearing to keep her bones strong but for actual cardio exercise, she needs to try to do water aerobics or water walking.  She is fearful of water and I told her that she should walk where her chest is above water.      OBSTRUCTIVE SLEEP APNEA (Chronic)    She has had a relatively significant Epworth score before.  Currently 17.  Needs to be reassessed for sleep apnea.  She apparently had been diagnosed in the past, but has not been on treatment.  We will therefore reorder sleep study in order to get her reestablished on CPAP.  I think this will help with her sleeping and this will also help with her energy level.  Weight loss may also help with sleep apnea.      Other Visit Diagnoses     Restless sleeper       Relevant Orders   Split night study   Daytime sleepiness       Relevant Orders   Split night study       ===================================  HPI:    [name] is a 66 y.o. female with a  PMH notable for bilateral DVT-s/p IVC filter 2009 (no longer on DOAC), Obesity-S/p Bariatric Surgery, HTN, OSA on CPAP who presents today for ~77-monthfollow-up to discuss results of recent CPX.  Jessica Floodwas by Jessica Sharp PA in May for Evaluation of palpitations & persistent exertional dyspnea.  Her Apple Watch that suggested that she possibly had atrial fibrillation.  She had a 14-day Zio patch monitor ordered showing no sustained arrhythmias-only short atrial runs 11-13 beats.  Jessica HAYwas last seen on October 07, 2021--to discuss results of her Coronary CTA which was essentially with Coronary Calcium Score 0 minimal disease.  Also echocardiogram showing GR 1 DD but otherwise basically normal findings.    Recent Hospitalizations:  None  Reviewed  CV studies:    The following studies were reviewed today: (if available, images/films reviewed: From Epic Chart or Care Everywhere) Cardiopulmonary Exercise Stress Test October 27, 2021: Good effort despite dyspnea and wheezing.  Modified Naughton protocol on treadmill.  Normal heart rate response.  No sustained arrhythmias.  Preexercise spirometry normal.  Low normal peak VO2 Low normal functional capacity with no clear cardiopulmonary limitation.  Overall, limitation felt to be primarily due to Body habitus / obesity and deconditioning tolerance.  There is also component of diastolic dysfunction.  Interval History:   Jessica DEMBYreturns today to discuss results of her CPX.  She was happy to hear that this was the case.  She acknowledges that she probably does need to do more exercise..  She still has intermittent palpitations and still has exertional dyspnea.  No prolonged irregular heartbeats but still has them off and on.  Other than a dyspnea on exertion and exercise intolerance/fatigue, she is doing fairly well.  Her blood pressure is borderline high today, but is usually not this high..  She denies any real chest pain or pressure with exertion but does have some off-and-on unusual chest discomfort that is not necessarily localized in the center of the chest that occurs mostly at rest, oftentimes when lying down in bed and rolling over.  Although she has exertional dyspnea, she denies any PND, orthopnea with mild LE edema.  She was able to get her heart rate up okay, but does note fatigue.  No syncope or near syncope, no TIA or amaurosis fugax.  No claudication. + HA  REVIEWED OF SYSTEMS   Positives noted above-fatigue and exercise intolerance.  Exertional dyspnea.  Also does note some wheezing, back pain and joint pain. Some mild wheezing. Still very anxious Unfortunate, has gained weight.  Does not seem well rested.  Notes unrestful sleep.  Epworth Sleepiness  Scale: Total score 17 Situation   Chance of Dozing/Sleeping (0 = never , 1 = slight chance , 2 = moderate chance , 3 = high chance )   sitting and reading 3   watching TV 2   sitting inactive in a public place 2   being a passenger in a motor vehicle for an hour or more 3   lying down in the afternoon 3   sitting and talking to someone 1   sitting quietly after lunch (no alcohol) 2   while stopped for a few minutes in traffic as the driver 1   Total Score  17; HTN, obesity; has been told that she stops breathing at night, and snores.  Wakes up feeling unrested.  Wakes up with dry mouth, and frequently wakes up to urinate.  Loss of sex drive.  Poor memory, headaches.  Dozes off or fall asleep inappropriate times.    have reviewed and (if needed) personally updated the patient's problem list, medications, allergies, past medical and surgical history, social and family history.   PAST MEDICAL HISTORY   Past Medical History:  Diagnosis Date   Anemia    takes Ferrous Sulfate daily   Anxiety    takes Citalopram daily   Arthritis    Asthma    2004-prior to gastric bypass and no problems since   CHF (congestive heart failure) ([place])    takes Lasix daily as needed   Chronic back pain    spondylolisthesis/stenosis/radiculopathy   Complication of anesthesia yrs ago   slow to wake up   Depression    Diabetes ([place])    DVT (deep venous thrombosis) ([place]) 01/17/2013   past hx. -tx.5-6 yrs ago bilateral legs, occ. sporadic swelling, has IVC filter implanted   Dysrhythmia    "heart tends to flutter"   Fibromyalgia    Fracture    right foot and is in a cam boot   Gallstones    GERD (gastroesophageal reflux disease)    hx of-no meds now   Heart murmur    History of bronchitis 2012 or 2013   History of colon polyps    Hypertension    takes Metoprolol daily   Insomnia    takes Melatonin daily   Pelvic floor dysfunction    Peripheral neuropathy    Pneumonia 90's   hx of   S/P gastric  bypass 2003   Sleep apnea    no cpap used in many yrs after weight lost-no machine now   Urinary frequency    Urinary urgency    Vitamin D deficiency     PAST SURGICAL HISTORY   Past Surgical History:  Procedure Laterality Date   BALLOON DILATION N/A 01/31/2013   Procedure: BALLOON DILATION;  Surgeon: Arta Silence, MD;  Location: [facility];  Service: Endoscopy;  Laterality: N/A;   CARDIAC EVENT MONITOR  03/2019   Predominantly sinus rhythm.  Rates range from 48-124 bpm.  Average 74 bpm.  Frequent short bursts (3-15 beats) PAT/PSVT--not indicated as being symptomatic on diary.  Otherwise rare PACs and PVCs.   CARDIAC EVENT MONITOR  08/2021   14-Day Zio Patch: Predominantly sinus rhythm with rate range 45 to 135 bpm.  Average 73 bpm.  Occasional (1.3%) PACs with rare PVCs and rare PAC couplets.  24 episodes of atrial runs: Fastest was 11 beats at a rate of 184 bpm, longest was 13 beats with a rate of 92 bpm.  No sustained tachyarrhythmias or bradycardia arrhythmias.  No atrial fibrillation or flutter.   CARDIOPULMONARY EXERCISE STRESS TEST (CPX)  10/27/2021   Good effort despite dyspnea and wheezing.  Modified Naughton protocol on treadmill.  Normal heart rate response.  No sustained arrhythmias.  Preexercise spirometry normal.  Low normal peak VO2. Low normal functional capacity with NO Clear Cardiopulmonary Limitation.  Overall limitation primarily due to obesity & deconditioning, along with a component of Diastolic Dysfunction   CHOLECYSTECTOMY  2007   COLONOSCOPY     CT CTA CORONARY W/CA SCORE W/CM &/OR WO/CM  09/21/2019   Coronary Calcium Score 0.  Normal RCA dominant coronary anatomy.  CAD RADS 1-minimal nonobstructive CAD (0-24%).  Consider nonatherosclerotic cause for chest pain.  Consider preventative therapy and risk factor modification.   DILATION AND CURETTAGE OF UTERUS  yrs ago   ESOPHAGOGASTRODUODENOSCOPY (EGD) WITH PROPOFOL  03/01/2012   Procedure:  ESOPHAGOGASTRODUODENOSCOPY (EGD) WITH  PROPOFOL;  Surgeon: Arta Silence, MD;  Location: [facility];  Service: Endoscopy;  Laterality: N/A;   ESOPHAGOGASTRODUODENOSCOPY (EGD) WITH PROPOFOL N/A 01/31/2013   Procedure: ESOPHAGOGASTRODUODENOSCOPY (EGD) WITH PROPOFOL;  Surgeon: Arta Silence, MD;  Location: [facility];  Service: Endoscopy;  Laterality: N/A;   ESOPHAGOGASTRODUODENOSCOPY (EGD) WITH PROPOFOL N/A 09/02/2015   Procedure: ESOPHAGOGASTRODUODENOSCOPY (EGD) WITH PROPOFOL;  Surgeon: Gatha Mayer, MD;  Location: [facility];  Service: Endoscopy;  Laterality: N/A;   GASTRIC BY-PASS  2004   HERNIA REPAIR  2005   INSERTION OF VENA CAVA FILTER  01/17/2013   inserted 2004- "abdomen"   LIGAMENT REPAIR Right 1987   Rt. knee scope   MAXIMUM ACCESS (MAS)POSTERIOR LUMBAR INTERBODY FUSION (PLIF) 2 LEVEL N/A 06/07/2014   Procedure: L4-5 L5-S1 FOR MAXIMUM ACCESS (MAS) POSTERIOR LUMBAR INTERBODY FUSION ;  Surgeon: Erline Levine, MD;  Location: [facility];  Service: Neurosurgery;  Laterality: N/A;  L4-5 L5-S1 FOR MAXIMUM ACCESS (MAS) POSTERIOR LUMBAR INTERBODY FUSION    TONSILLECTOMY     as child   TRANSTHORACIC ECHOCARDIOGRAM  12/2020   EF 55 to 60%.  No RWMA.  Mild LVH.  GR 1 DD-moderate LA dilation..  Mild MR..  Normal RV size/function.  Normal PAP.  Normal RAP. => Essentially stable from December 2020     Immunization History  Administered Date(s) Administered   Fluad Quad(high Dose 65+) 12/26/2020   Influenza Split 02/01/2007, 12/16/2017   Influenza Whole 02/01/2007   Influenza,inj,Quad PF,6+ Mos 12/16/2017   Influenza-Unspecified 02/19/2013, 05/20/2014, 02/01/2016, 12/17/2016, 12/21/2018, 02/19/2020   Moderna Sars-Covid-2 Vaccination 05/13/2019, 06/10/2019, 02/12/2020, 08/12/2020, 12/26/2020   PNEUMOCOCCAL CONJUGATE-20 03/05/2021   Pneumococcal Conjugate-13 01/02/2019   Td 11/12/1997   Tdap 11/12/1997, 08/07/2014   Zoster Recombinat (Shingrix) 10/04/2019    MEDICATIONS/ALLERGIES    Current Meds  Medication Sig   albuterol (PROVENTIL) (2.5 MG/3ML) 0.083% nebulizer solution Take 3 mLs (2.5 mg total) by nebulization every 6 (six) hours as needed for wheezing or shortness of breath.   albuterol (VENTOLIN HFA) 108 (90 Base) MCG/ACT inhaler INHALE 2 PUFFS INTO THE LUNGS EVERY 6 HOURS AS NEEDED FOR WHEEZING OR SHORTNESS OF BREATH   alendronate (FOSAMAX) 70 MG tablet Take 1 tablet (70 mg total) by mouth every 7 (seven) days. Take with a full glass of water on an empty stomach.   amLODipine (NORVASC) 10 MG tablet TAKE 1 TABLET EVERY DAY   Ascorbic Acid (VITAMIN C) 1000 MG tablet Take 1 tablet by mouth daily.   benzonatate (TESSALON) 100 MG capsule Take 1 capsule (100 mg total) by mouth 3 (three) times daily as needed for cough.   buPROPion (WELLBUTRIN XL) 300 MG 24 hr tablet Take 1 tablet (300 mg total) by mouth daily.   doxycycline (VIBRA-TABS) 100 MG tablet Take 1 tablet (100 mg total) by mouth 2 (two) times daily.   ferrous sulfate 325 (65 FE) MG tablet Take 1 tablet by mouth daily with breakfast.   fluticasone-salmeterol (ADVAIR) 100-50 MCG/ACT AEPB Inhale 1 puff into the lungs 2 (two) times daily.   gabapentin (NEURONTIN) 100 MG capsule Take 2 capsules (200 mg total) by mouth 2 (two) times daily.   ipratropium (ATROVENT) 0.03 % nasal spray Place 2 sprays into both nostrils every 12 (twelve) hours. X 5-7 days   levocetirizine (XYZAL) 5 MG tablet Take 1 tablet (5 mg total) by mouth every evening.   lipase/protease/amylase (CREON) 36000 UNITS CPEP capsule Take 1 capsule by mouth in the morning, at noon, in the evening, and at bedtime.  Multiple Vitamin (MULTI-VITAMIN) tablet Take by mouth daily. Take 1 Tablet Daily   ondansetron (ZOFRAN) 4 MG tablet Take 1 tablet (4 mg total) by mouth every 8 (eight) hours as needed for nausea or vomiting.   PARoxetine (PAXIL) 10 MG tablet Take 1 tablet (10 mg total) by mouth daily.   torsemide (DEMADEX) 20 MG tablet Take 1 tablet (20 mg  total) by mouth 3 (three) times a week. May take an additional '20mg'$  ( 1 tablets) as needed on the other days   traMADol (ULTRAM) 50 MG tablet Take 1 tablet (50 mg total) by mouth every 6 (six) hours as needed. Needs visit for future refills.   Vitamin D, Ergocalciferol, (DRISDOL) 1.25 MG (50000 UNIT) CAPS capsule TAKE 1 CAPSULE BY MOUTH EVERY 7 DAYS    Allergies  Allergen Reactions   Carafate [Sucralfate] Hives, Itching and Other (See Comments)    Angioedema      Sulfonamide Derivatives Rash    Syncope    SOCIAL HISTORY/FAMILY HISTORY   Reviewed in Epic:  Pertinent findings:  Social History   Tobacco Use   Smoking status: Former    Packs/day: 0.50    Years: 10.00    Total pack years: 5.00    Types: Cigarettes    Quit date: 04/19/2009    Years since quitting: 12.7   Smokeless tobacco: Never  Substance Use Topics   Alcohol use: Yes    Alcohol/week: 0.0 standard drinks of alcohol    Comment: occ. social- wine-1 drink monthly   Drug use: No   Social History   Social History Narrative   Widow.  No kids.  Lives with her mother who is 71.  She does walk with a cane.  She is a retired Investment banker, corporate   She quit smoking in 2003    OBJCTIVE -PE, EKG, labs   Wt Readings from Last 3 Encounters:  12/23/21 242 lb (109.8 kg)  11/04/21 239 lb (108.4 kg)  10/13/21 227 lb 9.6 oz (103.2 kg)    Physical Exam: BP 138/80 (BP Location: Left Arm, Patient Position: Sitting, Cuff Size: Large)   Pulse 71   Ht '5\' 4"'$  (1.626 m)   Wt 242 lb (109.8 kg)   BMI 41.54 kg/m  Physical Exam Vitals reviewed.  Constitutional:      General: She is not in acute distress.    Appearance: She is obese. She is not toxic-appearing.     Comments: Morbidly obese; well groomed; healthy appearing  HENT:     Head: Normocephalic and atraumatic.  Neck:     Vascular: JVD (Difficult to assess) present. No carotid bruit or hepatojugular reflux (No obvious HJR).  Cardiovascular:     Rate and Rhythm:  Normal rate and regular rhythm. No extrasystoles are present.    Chest Wall: PMI is not displaced.     Pulses: Decreased pulses (Diminished due to body habitus).     Heart sounds: S1 normal and S2 normal. Heart sounds are distant. Murmur (Soft 1/6 SEM at RUSB-neck) heard.     No friction rub. No gallop.  Pulmonary:     Effort: Pulmonary effort is normal.     Breath sounds: Wheezing present. No rhonchi or rales.  Chest:     Chest wall: No tenderness.  Musculoskeletal:        General: Swelling (Trivial) present. Normal range of motion.     Cervical back: Normal range of motion and neck supple.  Skin:    General: Skin is warm and  dry.     Coloration: Skin is not jaundiced or pale.  Neurological:     Mental Status: She is alert and oriented to person, place, and time.     Cranial Nerves: No cranial nerve deficit.     Motor: No weakness.     Gait: Gait abnormal (Mildly unsteady.  Walks with a cane.  Back pain related).  Psychiatric:        Mood and Affect: Mood normal.        Behavior: Behavior normal.        Thought Content: Thought content normal.        Judgment: Judgment normal.     Adult ECG Report  N/a  Recent Labs:  presumably followed by PCP ->   Lab Results  Component Value Date   CHOL 146 01/02/2019   HDL 73.40 01/02/2019   LDLCALC 60 01/02/2019   TRIG 66.0 01/02/2019   CHOLHDL 2 01/02/2019   Lab Results  Component Value Date   CREATININE 1.17 10/13/2021   BUN 20 10/13/2021   NA 142 10/13/2021   K 3.7 10/13/2021   CL 112 10/13/2021   CO2 21 10/13/2021      Latest Ref Rng & Units 10/13/2021    2:38 PM 07/22/2021   11:01 AM 07/17/2021    4:20 PM  CBC  WBC 4.0 - 10.5 K/uL 6.6  10.7  10.8   Hemoglobin 12.0 - 15.0 g/dL 12.1  11.2  10.6   Hematocrit 36.0 - 46.0 % 37.7  34.8  34.9   Platelets 150.0 - 400.0 K/uL 260.0  500.0  442     Lab Results  Component Value Date   HGBA1C 5.5 01/02/2019   Lab Results  Component Value Date   TSH 0.55 10/15/2020     ================================================== I spent a total of 37 minutes with the patient spent in direct patient consultation.  --> Prolonged discussion of symptoms and then results of tests.  We reviewed all of her studies to date as well as the treatment plan.  I reviewed the pathophysiology of diastolic function but also explained the concept of deconditioning.  We also then spent time discussing options for exercise. Additional time spent with chart review  / charting (studies, outside notes, etc): 18 min Total Time: 55 min  Current medicines are reviewed at length with the patient today.  (+/- concerns) n/a  Notice: This dictation was prepared with Dragon dictation along with smart phrase technology. Any transcriptional errors that result from this process are unintentional and may not be corrected upon review.  Studies Ordered:   Orders Placed This Encounter  Procedures   Split night study   No orders of the defined types were placed in this encounter.   Patient Instructions / Medication Changes & Studies & Tests Ordered   Patient Instructions  Medication Instructions:  No changes *If you need a refill on your cardiac medications before your next appointment, please call your pharmacy*   Lab Work: Not needed   Testing/Procedures:schedule for sleep study Your physician has recommended that you have a  split night sleep study. This test records several body functions during sleep, including: brain activity, eye movement, oxygen and carbon dioxide blood levels, heart rate and rhythm, breathing rate and rhythm, the flow of air through your mouth and nose, snoring, body muscle movements, and chest and belly movement.  Follow-Up: At [facility], you and your health needs are our priority.  As part of our continuing mission to  provide you with exceptional heart care, we have created designated Provider Care Teams.  These Care Teams include your primary  Cardiologist (physician) and Advanced Practice Providers (APPs -  Physician Assistants and Nurse Practitioners) who all work together to provide you with the care you need, when you need it.  Your next appointment:   12 month(s)  The format for your next appointment:   In Person  Provider:   Glenetta Hew, MD     Other Instructions  Can do water aerobics and /or water walking  Important Information About [facility]           Leonie Man, MD, MS Glenetta Hew, M.D., M.S. Interventional Cardiologist  [facility]  Pager # [phone] Phone # [phone] [address] [place], [place] [address]   Thank you for choosing [facility]!!

## 2021-12-28 ENCOUNTER — Telehealth: Payer: Self-pay | Admitting: *Deleted

## 2021-12-28 ENCOUNTER — Encounter: Payer: Self-pay | Admitting: Cardiology

## 2021-12-28 NOTE — Assessment & Plan Note (Signed)
She has had a relatively significant Epworth score before.  Currently 17.  Needs to be reassessed for sleep apnea.  She apparently had been diagnosed in the past, but has not been on treatment.  We will therefore reorder sleep study in order to get her reestablished on CPAP.  I think this will help with her sleeping and this will also help with her energy level.  Weight loss may also help with sleep apnea.

## 2021-12-28 NOTE — Assessment & Plan Note (Signed)
Morbid obesity.  Weight loss is key.  This is the key point of her dyspnea is obesity and deconditioning.  We talked about exercise.  I think the best option for her is going to be seated exercises but also water exercises.  We talked about water walking and water aerobics.  I basically gave her a pep talk for the majority of the visit convincing that she needs to get back into exercising and try to lose weight.  She does need to do some walking for weightbearing to keep her bones strong but for actual cardio exercise, she needs to try to do water aerobics or water walking.  She is fearful of water and I told her that she should walk where her chest is above water.

## 2021-12-28 NOTE — Telephone Encounter (Signed)
[employer] Auth for split night sleep study [id number]. Valid dates 01/15/22 to 04/15/22

## 2021-12-28 NOTE — Assessment & Plan Note (Signed)
With concern for diastolic dysfunction playing a role in her dyspnea, BP control is paramount.  No beta blocker due to fatigue.  On amlodipine.  Low threshold to consider ARB along with standing dose of Demadex (althoiugh only taking 3 x per week)

## 2021-12-28 NOTE — Assessment & Plan Note (Signed)
Echocardiogram would not explain dyspnea based on minimal grade 1 diastolic dysfunction.  I suspect that she does have some worsening diastolic function with exertion as suggested by CPX, but major contributing will was more related to her obesity and deconditioning.  Continue to treat blood pressure, continue to exercise.  Diet and exercise and weight loss.

## 2021-12-28 NOTE — Assessment & Plan Note (Signed)
She has this chronic intermittent episodes of chest discomfort which is random places of the chest, sometimes worse with breathing.  Not exertional in nature.  Not anginal.  She has had an nonischemic CPX and coronary CTA.  There is some possibility of microvascular disease, but this would not be resting symptoms this would be exertional.  Treatment here is also weight loss and exercise.

## 2021-12-31 ENCOUNTER — Telehealth (INDEPENDENT_AMBULATORY_CARE_PROVIDER_SITE_OTHER): Payer: Medicare HMO | Admitting: Internal Medicine

## 2021-12-31 DIAGNOSIS — R0989 Other specified symptoms and signs involving the circulatory and respiratory systems: Secondary | ICD-10-CM

## 2021-12-31 MED ORDER — PREDNISONE 20 MG PO TABS: 40.0000 mg | ORAL_TABLET | Freq: Every day | ORAL | 0 refills | Status: DC

## 2021-12-31 MED ORDER — MONTELUKAST SODIUM 10 MG PO TABS: 10.0000 mg | ORAL_TABLET | Freq: Every day | ORAL | 3 refills | Status: DC

## 2021-12-31 NOTE — Progress Notes (Unsigned)
Virtual Visit via Video Note  I connected with [name] on 12/31/21 at  9:20 AM EDT by a video enabled telemedicine application and verified that I am speaking with the correct person using two identifiers.  The patient and the provider were at separate locations throughout the entire encounter. Patient location: home, Provider location: work   I discussed the limitations of evaluation and management by telemedicine and the availability of in person appointments. The patient expressed understanding and agreed to proceed. The patient and the provider were the only parties present for the visit unless noted in HPI below.  History of Present Illness: The patient is a 66 y.o. female with visit for sinus problems and headache  Observations/Objective: Appearance: normal, some coughing, breathing appears normal talking in full sentences, casual grooming, mental status is A and O times 3  Assessment and Plan: See problem oriented charting  Follow Up Instructions: rx prednisone and singulair to help.  I discussed the assessment and treatment plan with the patient. The patient was provided an opportunity to ask questions and all were answered. The patient agreed with the plan and demonstrated an understanding of the instructions.   The patient was advised to call back or seek an in-person evaluation if the symptoms worsen or if the condition fails to improve as anticipated.  Hoyt Koch, MD

## 2022-01-01 ENCOUNTER — Encounter: Payer: Self-pay | Admitting: Internal Medicine

## 2022-01-01 DIAGNOSIS — R0989 Other specified symptoms and signs involving the circulatory and respiratory systems: Secondary | ICD-10-CM | POA: Insufficient documentation

## 2022-01-01 NOTE — Assessment & Plan Note (Signed)
Will add singulair 10 mg daily and rx prednisone short course to help. She will keep using xyzal and ipratropium nasal to help also. Given past hx C dif we will avoid antibiotics if at all possible. Symptoms ongoing around 1 week currently.

## 2022-01-04 ENCOUNTER — Encounter: Payer: Self-pay | Admitting: Internal Medicine

## 2022-01-12 NOTE — Progress Notes (Unsigned)
[facility] [place] Phone: [phone] Subjective:   [name], am serving as a scribe for Dr. Hulan Saas.  I'm seeing this patient by the request  of:  Hoyt Koch, MD  CC: Right knee pain follow-up  JSH:[id number]  11/04/2021 I believe the patient is having more fibromyalgia flare of the low back.  We will get x-rays to rule out such things as a compression fracture or insufficiency fractures that could be contributing.  Discussed with patient about posture and ergonomics otherwise.  Follow-up with me again after the Toradol and Depo-Medrol and x-rays in 6 weeks.  Worsening pain seek medical attention immediately.  Chronic, worsening symptoms.  Patient has failed all other conservative therapy.  We will try the viscosupplementation to see how patient responds.  In addition to this may need to consider the possibility of surgical intervention at some point but hopefully not.  Follow-up again in 6 to 12 weeks.  Updated 01/13/2022 [name] is a 66 y.o. female coming in with complaint of LBP and R knee pain. Patient states   Xray IMPRESSION: 1. Dextroscoliosis of the thoracic spine with multilevel degenerative changes. 2. Postoperative changes in the lower lumbar spine with [name] evidence of hardware loosening or acute fracture.       Past Medical History:  Diagnosis Date   Anemia    takes Ferrous Sulfate daily   Anxiety    takes Citalopram daily   Arthritis    Asthma    2004-prior to gastric bypass and [name] problems since   CHF (congestive heart failure) ([place])    takes Lasix daily as needed   Chronic back pain    spondylolisthesis/stenosis/radiculopathy   Complication of anesthesia yrs ago   slow to wake up   Depression    Diabetes ([place])    DVT (deep venous thrombosis) ([place]) 01/17/2013   past hx. -tx.5-6 yrs ago bilateral legs, occ. sporadic swelling, has IVC filter implanted    Dysrhythmia    "heart tends to flutter"   Fibromyalgia    Fracture    right foot and is in a cam boot   Gallstones    GERD (gastroesophageal reflux disease)    hx of-[name] meds now   Heart murmur    History of bronchitis 2012 or 2013   History of colon polyps    Hypertension    takes Metoprolol daily   Insomnia    takes Melatonin daily   Pelvic floor dysfunction    Peripheral neuropathy    Pneumonia 90's   hx of   S/P gastric bypass 2003   Sleep apnea    [name] cpap used in many yrs after weight lost-[name] machine now   Urinary frequency    Urinary urgency    Vitamin D deficiency    Past Surgical History:  Procedure Laterality Date   BALLOON DILATION N/A 01/31/2013   Procedure: BALLOON DILATION;  Surgeon: Arta Silence, MD;  Location: [facility];  Service: Endoscopy;  Laterality: N/A;   CARDIAC EVENT MONITOR  03/2019   Predominantly sinus rhythm.  Rates range from 48-124 bpm.  Average 74 bpm.  Frequent short bursts (3-15 beats) PAT/PSVT--not indicated as being symptomatic on diary.  Otherwise rare PACs and PVCs.   CARDIAC EVENT MONITOR  08/2021   14-Day Zio Patch: Predominantly sinus rhythm with rate range 45 to 135 bpm.  Average 73 bpm.  Occasional (1.3%) PACs with rare PVCs and rare PAC couplets.  24 episodes of atrial runs: Fastest was 11 beats at a rate of 184 bpm, longest was 13 beats with a rate of 92 bpm.  [name] sustained tachyarrhythmias or bradycardia arrhythmias.  [name] atrial fibrillation or flutter.   CARDIOPULMONARY EXERCISE STRESS TEST (CPX)  10/27/2021   Good effort despite dyspnea and wheezing.  Modified Naughton protocol on treadmill.  Normal heart rate response.  [name] sustained arrhythmias.  Preexercise spirometry normal.  Low normal peak VO2. Low normal functional capacity with [name] Clear Cardiopulmonary Limitation.  Overall limitation primarily due to obesity & deconditioning, along with a component of Diastolic Dysfunction   CHOLECYSTECTOMY  2007   COLONOSCOPY     CT CTA  CORONARY W/CA SCORE W/CM &/OR WO/CM  09/21/2019   Coronary Calcium Score 0.  Normal RCA dominant coronary anatomy.  CAD RADS 1-minimal nonobstructive CAD (0-24%).  Consider nonatherosclerotic cause for chest pain.  Consider preventative therapy and risk factor modification.   DILATION AND CURETTAGE OF UTERUS  yrs ago   ESOPHAGOGASTRODUODENOSCOPY (EGD) WITH PROPOFOL  03/01/2012   Procedure: ESOPHAGOGASTRODUODENOSCOPY (EGD) WITH PROPOFOL;  Surgeon: Arta Silence, MD;  Location: [facility];  Service: Endoscopy;  Laterality: N/A;   ESOPHAGOGASTRODUODENOSCOPY (EGD) WITH PROPOFOL N/A 01/31/2013   Procedure: ESOPHAGOGASTRODUODENOSCOPY (EGD) WITH PROPOFOL;  Surgeon: Arta Silence, MD;  Location: [facility];  Service: Endoscopy;  Laterality: N/A;   ESOPHAGOGASTRODUODENOSCOPY (EGD) WITH PROPOFOL N/A 09/02/2015   Procedure: ESOPHAGOGASTRODUODENOSCOPY (EGD) WITH PROPOFOL;  Surgeon: Gatha Mayer, MD;  Location: [facility];  Service: Endoscopy;  Laterality: N/A;   GASTRIC BY-PASS  2004   HERNIA REPAIR  2005   INSERTION OF VENA CAVA FILTER  01/17/2013   inserted 2004- "abdomen"   LIGAMENT REPAIR Right 1987   Rt. knee scope   MAXIMUM ACCESS (MAS)POSTERIOR LUMBAR INTERBODY FUSION (PLIF) 2 LEVEL N/A 06/07/2014   Procedure: L4-5 L5-S1 FOR MAXIMUM ACCESS (MAS) POSTERIOR LUMBAR INTERBODY FUSION ;  Surgeon: Erline Levine, MD;  Location: [place] [facility];  Service: Neurosurgery;  Laterality: N/A;  L4-5 L5-S1 FOR MAXIMUM ACCESS (MAS) POSTERIOR LUMBAR INTERBODY FUSION    TONSILLECTOMY     as child   TRANSTHORACIC ECHOCARDIOGRAM  12/2020   EF 55 to 60%.  [name] RWMA.  Mild LVH.  GR 1 DD-moderate LA dilation..  Mild MR..  Normal RV size/function.  Normal PAP.  Normal RAP. => Essentially stable from December 2020   Social History   Socioeconomic History   Marital status: Widowed    Spouse name: Not on file   Number of children: 0   Years of education: college   Highest education level: Master's degree (e.g., MA,  MS, MEng, MEd, MSW, MBA)  Occupational History   Occupation: retired  Tobacco Use   Smoking status: Former    Packs/day: 0.50    Years: 10.00    Total pack years: 5.00    Types: Cigarettes    Quit date: 04/19/2009    Years since quitting: 12.7   Smokeless tobacco: Never  Substance and Sexual Activity   Alcohol use: Yes    Alcohol/week: 0.0 standard drinks of alcohol    Comment: occ. social- wine-1 drink monthly   Drug use: [name]   Sexual activity: Not Currently  Other Topics Concern   Not on file  Social History Narrative   Widow.  [name] kids.  Lives with her mother who is 96.  She does walk with a cane.  She is a retired Investment banker, corporate   She quit smoking in 2003   Social Determinants  of Health   Financial Resource Strain: Low Risk  (09/29/2021)   Overall Financial Resource Strain (CARDIA)    Difficulty of Paying Living Expenses: Not hard at all  Food Insecurity: [name] Food Insecurity (09/29/2021)   Hunger Vital Sign    Worried About Running Out of Food in the Last Year: Never true    Ran Out of Food in the Last Year: Never true  Transportation Needs: [name] Transportation Needs (09/29/2021)   PRAPARE - Hydrologist (Medical): [name]    Lack of Transportation (Non-Medical): [name]  Physical Activity: Sufficiently Active (09/29/2021)   Exercise Vital Sign    Days of Exercise per Week: 7 days    Minutes of Exercise per Session: 30 min  Stress: [name] Stress Concern Present (09/29/2021)   [place]    Feeling of Stress : Not at all  Social Connections: Unknown (09/29/2021)   Social Connection and Isolation Panel [NHANES]    Frequency of Communication with Friends and Family: More than three times a week    Frequency of Social Gatherings with Friends and Family: Once a week    Attends Religious Services: Patient refused    Active Member of Clubs or Organizations: Yes    Attends Archivist  Meetings: 1 to 4 times per year    Marital Status: Widowed   Allergies  Allergen Reactions   Carafate [Sucralfate] Hives, Itching and Other (See Comments)    Angioedema      Sulfonamide Derivatives Rash    Syncope   Family History  Problem Relation Age of Onset   Diabetes Mother    Atrial fibrillation Mother    Hypertension Mother    Heart disease Father        Unsure of details   Hypertension Father    Prostate cancer Father    Alzheimer's disease Father    Kidney disease Brother    Healthy Sister    Diabetes Maternal Grandmother    Heart disease Maternal Grandmother    Stroke Maternal Grandfather 33   Hypertension Maternal Grandfather    Hypertension Paternal Grandmother    Alzheimer's disease Paternal Grandmother    Colon cancer Neg Hx    Colon polyps Neg Hx    Stomach cancer Neg Hx    Esophageal cancer Neg Hx    Pancreatic cancer Neg Hx    Liver disease Neg Hx     Current Outpatient Medications (Endocrine & Metabolic):    alendronate (FOSAMAX) 70 MG tablet, Take 1 tablet (70 mg total) by mouth every 7 (seven) days. Take with a full glass of water on an empty stomach.   predniSONE (DELTASONE) 20 MG tablet, Take 2 tablets (40 mg total) by mouth daily with breakfast.  Current Outpatient Medications (Cardiovascular):    amLODipine (NORVASC) 10 MG tablet, TAKE 1 TABLET EVERY DAY   torsemide (DEMADEX) 20 MG tablet, Take 1 tablet (20 mg total) by mouth 3 (three) times a week. May take an additional '20mg'$  ( 1 tablets) as needed on the other days  Current Outpatient Medications (Respiratory):    albuterol (PROVENTIL) (2.5 MG/3ML) 0.083% nebulizer solution, Take 3 mLs (2.5 mg total) by nebulization every 6 (six) hours as needed for wheezing or shortness of breath.   albuterol (VENTOLIN HFA) 108 (90 Base) MCG/ACT inhaler, INHALE 2 PUFFS INTO THE LUNGS EVERY 6 HOURS AS NEEDED FOR WHEEZING OR SHORTNESS OF BREATH   benzonatate (TESSALON) 100 MG capsule, Take  1 capsule (100 mg  total) by mouth 3 (three) times daily as needed for cough.   fluticasone-salmeterol (ADVAIR) 100-50 MCG/ACT AEPB, Inhale 1 puff into the lungs 2 (two) times daily.   ipratropium (ATROVENT) 0.03 % nasal spray, Place 2 sprays into both nostrils every 12 (twelve) hours. X 5-7 days   levocetirizine (XYZAL) 5 MG tablet, Take 1 tablet (5 mg total) by mouth every evening.   montelukast (SINGULAIR) 10 MG tablet, Take 1 tablet (10 mg total) by mouth at bedtime.  Current Outpatient Medications (Analgesics):    traMADol (ULTRAM) 50 MG tablet, Take 1 tablet (50 mg total) by mouth every 6 (six) hours as needed. Needs visit for future refills.  Current Outpatient Medications (Hematological):    ferrous sulfate 325 (65 FE) MG tablet, Take 1 tablet by mouth daily with breakfast.  Current Outpatient Medications (Other):    Ascorbic Acid (VITAMIN C) 1000 MG tablet, Take 1 tablet by mouth daily.   buPROPion (WELLBUTRIN XL) 300 MG 24 hr tablet, Take 1 tablet (300 mg total) by mouth daily.   doxycycline (VIBRA-TABS) 100 MG tablet, Take 1 tablet (100 mg total) by mouth 2 (two) times daily.   gabapentin (NEURONTIN) 100 MG capsule, Take 2 capsules (200 mg total) by mouth 2 (two) times daily.   lipase/protease/amylase (CREON) 36000 UNITS CPEP capsule, Take 1 capsule by mouth in the morning, at noon, in the evening, and at bedtime.   Multiple Vitamin (MULTI-VITAMIN) tablet, Take by mouth daily. Take 1 Tablet Daily   ondansetron (ZOFRAN) 4 MG tablet, Take 1 tablet (4 mg total) by mouth every 8 (eight) hours as needed for nausea or vomiting.   PARoxetine (PAXIL) 10 MG tablet, Take 1 tablet (10 mg total) by mouth daily.   Vitamin D, Ergocalciferol, (DRISDOL) 1.25 MG (50000 UNIT) CAPS capsule, TAKE 1 CAPSULE BY MOUTH EVERY 7 DAYS   Reviewed prior external information including notes and imaging from  primary care provider As well as notes that were available from care everywhere and other healthcare systems.  Past  medical history, social, surgical and family history all reviewed in electronic medical record.  [name] pertanent information unless stated regarding to the chief complaint.   Review of Systems:  [name], visual changes, nausea, vomiting, diarrhea, constipation, dizziness, abdominal pain, skin rash, fevers, chills, night sweats, weight loss, swollen lymph nodesjoint swelling, chest pain, shortness of breath, mood changes. POSITIVE muscle aches, body aches, headaches  Objective  Blood pressure 138/70, pulse 92, height '5\' 4"'$  (1.626 m), SpO2 93 %.   General: [name] apparent distress alert and oriented x3 mood and affect normal, dressed appropriately.  HEENT: Pupils equal, extraocular movements intact  Respiratory: Patient's speak in full sentences and does not appear short of breath  Severely antalgic gait noted. Knee instability noted on the right. Trace effusion  TTP diffusely.   After informed written and verbal consent, patient was seated on exam table. Right knee was prepped with alcohol swab and utilizing anterolateral approach, patient's right knee space was injected with 4:1  marcaine 0.5%: Kenalog '40mg'$ /dL. Patient tolerated the procedure well without immediate complications.    Impression and Recommendations:    The above documentation has been reviewed and is accurate and complete Lyndal Pulley, DO

## 2022-01-13 ENCOUNTER — Ambulatory Visit: Payer: Medicare HMO | Admitting: Family Medicine

## 2022-01-13 DIAGNOSIS — M1711 Unilateral primary osteoarthritis, right knee: Secondary | ICD-10-CM | POA: Diagnosis not present

## 2022-01-13 NOTE — Assessment & Plan Note (Signed)
Injection given given today for this chronic problem.  Patient does have an exacerbation noted.  Patient did not respond extremely well to the viscosupplementation at this point.  Increase activity slowly otherwise.  Follow-up again with me 10 to 12 weeks.

## 2022-01-13 NOTE — Patient Instructions (Addendum)
Injection in knee today Coop pillow Look to see if gym has aqua jogger

## 2022-01-17 ENCOUNTER — Other Ambulatory Visit: Payer: Self-pay | Admitting: Family Medicine

## 2022-01-18 DIAGNOSIS — K8681 Exocrine pancreatic insufficiency: Secondary | ICD-10-CM | POA: Diagnosis not present

## 2022-01-18 DIAGNOSIS — K219 Gastro-esophageal reflux disease without esophagitis: Secondary | ICD-10-CM | POA: Diagnosis not present

## 2022-01-18 DIAGNOSIS — R151 Fecal smearing: Secondary | ICD-10-CM | POA: Diagnosis not present

## 2022-01-18 DIAGNOSIS — R1310 Dysphagia, unspecified: Secondary | ICD-10-CM | POA: Diagnosis not present

## 2022-01-19 ENCOUNTER — Ambulatory Visit (HOSPITAL_BASED_OUTPATIENT_CLINIC_OR_DEPARTMENT_OTHER): Payer: Medicare HMO | Attending: Cardiology | Admitting: Cardiology

## 2022-01-19 ENCOUNTER — Other Ambulatory Visit: Payer: Self-pay | Admitting: Internal Medicine

## 2022-01-19 DIAGNOSIS — R400 Somnolence: Secondary | ICD-10-CM

## 2022-01-19 DIAGNOSIS — R5383 Other fatigue: Secondary | ICD-10-CM

## 2022-01-19 DIAGNOSIS — R0609 Other forms of dyspnea: Secondary | ICD-10-CM

## 2022-01-19 DIAGNOSIS — G479 Sleep disorder, unspecified: Secondary | ICD-10-CM

## 2022-01-20 NOTE — Telephone Encounter (Signed)
Please advise 

## 2022-02-01 DIAGNOSIS — R159 Full incontinence of feces: Secondary | ICD-10-CM | POA: Diagnosis not present

## 2022-02-01 DIAGNOSIS — M6281 Muscle weakness (generalized): Secondary | ICD-10-CM | POA: Diagnosis not present

## 2022-02-01 DIAGNOSIS — M62838 Other muscle spasm: Secondary | ICD-10-CM | POA: Diagnosis not present

## 2022-02-01 DIAGNOSIS — M6289 Other specified disorders of muscle: Secondary | ICD-10-CM | POA: Diagnosis not present

## 2022-02-01 DIAGNOSIS — N3946 Mixed incontinence: Secondary | ICD-10-CM | POA: Diagnosis not present

## 2022-02-02 ENCOUNTER — Ambulatory Visit (INDEPENDENT_AMBULATORY_CARE_PROVIDER_SITE_OTHER): Payer: Medicare HMO | Admitting: Internal Medicine

## 2022-02-02 VITALS — BP 122/80 | HR 74 | Temp 98.2°F | Ht 64.0 in | Wt 247.5 lb

## 2022-02-02 DIAGNOSIS — H1013 Acute atopic conjunctivitis, bilateral: Secondary | ICD-10-CM

## 2022-02-02 DIAGNOSIS — R350 Frequency of micturition: Secondary | ICD-10-CM

## 2022-02-02 DIAGNOSIS — R8290 Unspecified abnormal findings in urine: Secondary | ICD-10-CM

## 2022-02-02 LAB — POCT URINALYSIS DIPSTICK
Bilirubin, UA: 2
Blood, UA: 2
Glucose, UA: NEGATIVE
Ketones, UA: 2
Leukocytes, UA: NEGATIVE
Nitrite, UA: 2
Protein, UA: POSITIVE — AB
Spec Grav, UA: >=1.03 — AB (ref 1.010–1.025)
Urobilinogen, UA: 1 E.U./dL
pH, UA: 6 (ref 5.0–8.0)

## 2022-02-02 MED ORDER — SEMAGLUTIDE-WEIGHT MANAGEMENT 0.25 MG/0.5ML ~~LOC~~ SOAJ: 0.2500 mg | SUBCUTANEOUS | 0 refills | Status: AC

## 2022-02-02 MED ORDER — OLOPATADINE HCL 0.1 % OP SOLN: 1.0000 [drp] | Freq: Two times a day (BID) | OPHTHALMIC | 12 refills | Status: DC

## 2022-02-02 MED ORDER — SEMAGLUTIDE-WEIGHT MANAGEMENT 2.4 MG/0.75ML ~~LOC~~ SOAJ: 2.4000 mg | SUBCUTANEOUS | 0 refills | Status: DC

## 2022-02-02 MED ORDER — SEMAGLUTIDE-WEIGHT MANAGEMENT 1 MG/0.5ML ~~LOC~~ SOAJ: 1.0000 mg | SUBCUTANEOUS | 0 refills | Status: AC

## 2022-02-02 MED ORDER — SEMAGLUTIDE-WEIGHT MANAGEMENT 1.7 MG/0.75ML ~~LOC~~ SOAJ: 1.7000 mg | SUBCUTANEOUS | 0 refills | Status: AC

## 2022-02-02 MED ORDER — SEMAGLUTIDE-WEIGHT MANAGEMENT 0.5 MG/0.5ML ~~LOC~~ SOAJ: 0.5000 mg | SUBCUTANEOUS | 0 refills | Status: AC

## 2022-02-02 MED ORDER — NITROFURANTOIN MONOHYD MACRO 100 MG PO CAPS: 100.0000 mg | ORAL_CAPSULE | Freq: Two times a day (BID) | ORAL | 0 refills | Status: DC

## 2022-02-02 NOTE — Patient Instructions (Addendum)
We have sent in the eye drops to use 1 drop twice a day in each eye.  We have sent in the weight loss medicine wegovy to use once a week.  We have sent in macrobid to take 1 pill twice a day for 5 days.

## 2022-02-02 NOTE — Progress Notes (Incomplete)
   Subjective:   Patient ID: [name], female    DOB: Jul 04, 1955, 66 y.o.   MRN: [id number]  HPI The patient is a 66 YO female coming in for multiple concerns.   Review of Systems  Objective:  Physical Exam  Vitals:   02/02/22 1432  BP: 122/80  Pulse: 74  Temp: 98.2 F (36.8 C)  TempSrc: Oral  SpO2: 94%  Weight: 247 lb 8 oz (112.3 kg)  Height: '5\' 4"'$  (1.626 m)    Assessment & Plan:

## 2022-02-02 NOTE — Progress Notes (Unsigned)
   Subjective:   Patient ID: [name], female    DOB: 1955/10/29, 66 y.o.   MRN: [id number]  HPI The patient is a 66 YO female coming in for multiple concerns.   Review of Systems  Objective:  Physical Exam  There were no vitals filed for this visit.  Assessment & Plan:

## 2022-02-03 ENCOUNTER — Encounter: Payer: Self-pay | Admitting: Internal Medicine

## 2022-02-03 DIAGNOSIS — H1010 Acute atopic conjunctivitis, unspecified eye: Secondary | ICD-10-CM | POA: Insufficient documentation

## 2022-02-03 NOTE — Assessment & Plan Note (Signed)
Rx olopatadine eye drops to use. She is having discharge and itching since fall allergies. No crusting to suggest bacterial conjunctivitis.

## 2022-02-03 NOTE — Assessment & Plan Note (Signed)
She would like to try wegovy. She checked and coverage and seems to have coverage. We discussed possible side effects. Rx usual titration schedule wegovy for her. It is suspected that her weight is impacting her QOL significantly.

## 2022-02-03 NOTE — Assessment & Plan Note (Signed)
POC U/A done in office without clear signs of infection however she has been taking otc products which can mask this. She has had pyelonephritis in the past and can tell she is getting UTI. Will send for culture and rx macrobid 1 week course. If no bacteria will stop.

## 2022-02-04 ENCOUNTER — Telehealth: Payer: Self-pay

## 2022-02-04 LAB — URINE CULTURE

## 2022-02-04 NOTE — Telephone Encounter (Signed)
Gave message below, patient verbalized okay and no questions.

## 2022-02-05 NOTE — Telephone Encounter (Signed)
Notified pt with lab results.

## 2022-02-15 DIAGNOSIS — R159 Full incontinence of feces: Secondary | ICD-10-CM | POA: Diagnosis not present

## 2022-02-15 DIAGNOSIS — N3946 Mixed incontinence: Secondary | ICD-10-CM | POA: Diagnosis not present

## 2022-02-15 DIAGNOSIS — M6281 Muscle weakness (generalized): Secondary | ICD-10-CM | POA: Diagnosis not present

## 2022-02-15 DIAGNOSIS — M62838 Other muscle spasm: Secondary | ICD-10-CM | POA: Diagnosis not present

## 2022-02-15 DIAGNOSIS — M6289 Other specified disorders of muscle: Secondary | ICD-10-CM | POA: Diagnosis not present

## 2022-02-17 ENCOUNTER — Telehealth: Payer: Self-pay | Admitting: *Deleted

## 2022-02-17 NOTE — Telephone Encounter (Signed)
START PA-WEGOVY 2.4 MG/0.75ML AUTO INJECTOR--awaiting for approval

## 2022-02-23 NOTE — Progress Notes (Signed)
[name] [place] Phone: [phone] Subjective:   Rito Ehrlich, am serving as a scribe for Dr. Hulan Saas.  I'm seeing this patient by the request  of:  Hoyt Koch, MD  CC: right knee pain   VCB:[id number]  01/13/2022 Injection given given today for this chronic problem.  Patient does have an exacerbation noted.  Patient did not respond extremely well to the viscosupplementation at this point.  Increase activity slowly otherwise.  Follow-up again with me 10 to 12 weeks     Update 02/25/2022 [name] is a 66 y.o. female coming in with complaint of R knee pain. Patient states the knee is bothering her and it is now having problems with walking. When she walks she will start to sway from one side to the other which is new for her. Patient feels so unbalanced even if she is walking with a cane.       Past Medical History:  Diagnosis Date   Anemia    takes Ferrous Sulfate daily   Anxiety    takes Citalopram daily   Arthritis    Asthma    2004-prior to gastric bypass and no problems since   CHF (congestive heart failure) ([place])    takes Lasix daily as needed   Chronic back pain    spondylolisthesis/stenosis/radiculopathy   Complication of anesthesia yrs ago   slow to wake up   Depression    Diabetes ([place])    DVT (deep venous thrombosis) ([place]) 01/17/2013   past hx. -tx.5-6 yrs ago bilateral legs, occ. sporadic swelling, has IVC filter implanted   Dysrhythmia    "heart tends to flutter"   Fibromyalgia    Fracture    right foot and is in a cam boot   Gallstones    GERD (gastroesophageal reflux disease)    hx of-no meds now   Heart murmur    History of bronchitis 2012 or 2013   History of colon polyps    Hypertension    takes Metoprolol daily   Insomnia    takes Melatonin daily   Pelvic floor dysfunction    Peripheral neuropathy    Pneumonia 90's   hx of   S/P gastric bypass  2003   Sleep apnea    no cpap used in many yrs after weight lost-no machine now   Urinary frequency    Urinary urgency    Vitamin D deficiency    Past Surgical History:  Procedure Laterality Date   BALLOON DILATION N/A 01/31/2013   Procedure: BALLOON DILATION;  Surgeon: Arta Silence, MD;  Location: [facility];  Service: Endoscopy;  Laterality: N/A;   CARDIAC EVENT MONITOR  03/2019   Predominantly sinus rhythm.  Rates range from 48-124 bpm.  Average 74 bpm.  Frequent short bursts (3-15 beats) PAT/PSVT--not indicated as being symptomatic on diary.  Otherwise rare PACs and PVCs.   CARDIAC EVENT MONITOR  08/2021   14-Day Zio Patch: Predominantly sinus rhythm with rate range 45 to 135 bpm.  Average 73 bpm.  Occasional (1.3%) PACs with rare PVCs and rare PAC couplets.  24 episodes of atrial runs: Fastest was 11 beats at a rate of 184 bpm, longest was 13 beats with a rate of 92 bpm.  No sustained tachyarrhythmias or bradycardia arrhythmias.  No atrial fibrillation or flutter.   CARDIOPULMONARY EXERCISE STRESS TEST (CPX)  10/27/2021   Good effort despite dyspnea and wheezing.  Modified Naughton protocol  on treadmill.  Normal heart rate response.  No sustained arrhythmias.  Preexercise spirometry normal.  Low normal peak VO2. Low normal functional capacity with NO Clear Cardiopulmonary Limitation.  Overall limitation primarily due to obesity & deconditioning, along with a component of Diastolic Dysfunction   CHOLECYSTECTOMY  2007   COLONOSCOPY     CT CTA CORONARY W/CA SCORE W/CM &/OR WO/CM  09/21/2019   Coronary Calcium Score 0.  Normal RCA dominant coronary anatomy.  CAD RADS 1-minimal nonobstructive CAD (0-24%).  Consider nonatherosclerotic cause for chest pain.  Consider preventative therapy and risk factor modification.   DILATION AND CURETTAGE OF UTERUS  yrs ago   ESOPHAGOGASTRODUODENOSCOPY (EGD) WITH PROPOFOL  03/01/2012   Procedure: ESOPHAGOGASTRODUODENOSCOPY (EGD) WITH PROPOFOL;  Surgeon:  Arta Silence, MD;  Location: [facility];  Service: Endoscopy;  Laterality: N/A;   ESOPHAGOGASTRODUODENOSCOPY (EGD) WITH PROPOFOL N/A 01/31/2013   Procedure: ESOPHAGOGASTRODUODENOSCOPY (EGD) WITH PROPOFOL;  Surgeon: Arta Silence, MD;  Location: [facility];  Service: Endoscopy;  Laterality: N/A;   ESOPHAGOGASTRODUODENOSCOPY (EGD) WITH PROPOFOL N/A 09/02/2015   Procedure: ESOPHAGOGASTRODUODENOSCOPY (EGD) WITH PROPOFOL;  Surgeon: Gatha Mayer, MD;  Location: [facility];  Service: Endoscopy;  Laterality: N/A;   GASTRIC BY-PASS  2004   HERNIA REPAIR  2005   INSERTION OF VENA CAVA FILTER  01/17/2013   inserted 2004- "abdomen"   LIGAMENT REPAIR Right 1987   Rt. knee scope   MAXIMUM ACCESS (MAS)POSTERIOR LUMBAR INTERBODY FUSION (PLIF) 2 LEVEL N/A 06/07/2014   Procedure: L4-5 L5-S1 FOR MAXIMUM ACCESS (MAS) POSTERIOR LUMBAR INTERBODY FUSION ;  Surgeon: Erline Levine, MD;  Location: [facility];  Service: Neurosurgery;  Laterality: N/A;  L4-5 L5-S1 FOR MAXIMUM ACCESS (MAS) POSTERIOR LUMBAR INTERBODY FUSION    TONSILLECTOMY     as child   TRANSTHORACIC ECHOCARDIOGRAM  12/2020   EF 55 to 60%.  No RWMA.  Mild LVH.  GR 1 DD-moderate LA dilation..  Mild MR..  Normal RV size/function.  Normal PAP.  Normal RAP. => Essentially stable from December 2020   Social History   Socioeconomic History   Marital status: Widowed    Spouse name: Not on file   Number of children: 0   Years of education: college   Highest education level: Master's degree (e.g., MA, MS, MEng, MEd, MSW, MBA)  Occupational History   Occupation: retired  Tobacco Use   Smoking status: Former    Packs/day: 0.50    Years: 10.00    Total pack years: 5.00    Types: Cigarettes    Quit date: 04/19/2009    Years since quitting: 12.8   Smokeless tobacco: Never  Substance and Sexual Activity   Alcohol use: Yes    Alcohol/week: 0.0 standard drinks of alcohol    Comment: occ. social- wine-1 drink monthly   Drug use: No   Sexual  activity: Not Currently  Other Topics Concern   Not on file  Social History Narrative   Widow.  No kids.  Lives with her mother who is 13.  She does walk with a cane.  She is a retired Investment banker, corporate   She quit smoking in 2003   Social Determinants of Health   Financial Resource Strain: Low Risk  (09/29/2021)   Overall Financial Resource Strain (CARDIA)    Difficulty of Paying Living Expenses: Not hard at all  Food Insecurity: No Food Insecurity (09/29/2021)   Hunger Vital Sign    Worried About Running Out of Food in the Last Year: Never true  Ran Out of Food in the Last Year: Never true  Transportation Needs: No Transportation Needs (09/29/2021)   PRAPARE - Hydrologist (Medical): No    Lack of Transportation (Non-Medical): No  Physical Activity: Sufficiently Active (09/29/2021)   Exercise Vital Sign    Days of Exercise per Week: 7 days    Minutes of Exercise per Session: 30 min  Stress: No Stress Concern Present (09/29/2021)   [place]    Feeling of Stress : Not at all  Social Connections: Unknown (09/29/2021)   Social Connection and Isolation Panel [NHANES]    Frequency of Communication with Friends and Family: More than three times a week    Frequency of Social Gatherings with Friends and Family: Once a week    Attends Religious Services: Patient refused    Active Member of Clubs or Organizations: Yes    Attends Archivist Meetings: 1 to 4 times per year    Marital Status: Widowed   Allergies  Allergen Reactions   Carafate [Sucralfate] Hives, Itching and Other (See Comments)    Angioedema      Sulfonamide Derivatives Rash    Syncope   Family History  Problem Relation Age of Onset   Diabetes Mother    Atrial fibrillation Mother    Hypertension Mother    Heart disease Father        Unsure of details   Hypertension Father    Prostate cancer Father     Alzheimer's disease Father    Kidney disease Brother    Healthy Sister    Diabetes Maternal Grandmother    Heart disease Maternal Grandmother    Stroke Maternal Grandfather 47   Hypertension Maternal Grandfather    Hypertension Paternal Grandmother    Alzheimer's disease Paternal Grandmother    Colon cancer Neg Hx    Colon polyps Neg Hx    Stomach cancer Neg Hx    Esophageal cancer Neg Hx    Pancreatic cancer Neg Hx    Liver disease Neg Hx     Current Outpatient Medications (Endocrine & Metabolic):    alendronate (FOSAMAX) 70 MG tablet, Take 1 tablet (70 mg total) by mouth every 7 (seven) days. Take with a full glass of water on an empty stomach.  Current Outpatient Medications (Cardiovascular):    amLODipine (NORVASC) 10 MG tablet, TAKE 1 TABLET EVERY DAY   torsemide (DEMADEX) 20 MG tablet, Take 1 tablet (20 mg total) by mouth 3 (three) times a week. May take an additional '20mg'$  ( 1 tablets) as needed on the other days  Current Outpatient Medications (Respiratory):    albuterol (PROVENTIL) (2.5 MG/3ML) 0.083% nebulizer solution, Take 3 mLs (2.5 mg total) by nebulization every 6 (six) hours as needed for wheezing or shortness of breath.   albuterol (VENTOLIN HFA) 108 (90 Base) MCG/ACT inhaler, INHALE 2 PUFFS INTO THE LUNGS EVERY 6 HOURS AS NEEDED FOR WHEEZING OR SHORTNESS OF BREATH   fluticasone-salmeterol (ADVAIR) 100-50 MCG/ACT AEPB, Inhale 1 puff into the lungs 2 (two) times daily.   ipratropium (ATROVENT) 0.03 % nasal spray, Place 2 sprays into both nostrils every 12 (twelve) hours. X 5-7 days   levocetirizine (XYZAL) 5 MG tablet, Take 1 tablet (5 mg total) by mouth every evening.   montelukast (SINGULAIR) 10 MG tablet, Take 1 tablet (10 mg total) by mouth at bedtime.   benzonatate (TESSALON) 100 MG capsule, Take 1 capsule (100 mg total) by  mouth 3 (three) times daily as needed for cough. (Patient not taking: Reported on 02/02/2022)  Current Outpatient Medications (Analgesics):     traMADol (ULTRAM) 50 MG tablet, Take 1 tablet (50 mg total) by mouth every 6 (six) hours as needed. Needs visit for future refills.  Current Outpatient Medications (Hematological):    ferrous sulfate 325 (65 FE) MG tablet, Take 1 tablet by mouth daily with breakfast.  Current Outpatient Medications (Other):    Ascorbic Acid (VITAMIN C) 1000 MG tablet, Take 1 tablet by mouth daily.   buPROPion (WELLBUTRIN XL) 300 MG 24 hr tablet, Take 1 tablet (300 mg total) by mouth daily.   gabapentin (NEURONTIN) 100 MG capsule, TAKE 2 CAPSULES(200 MG) BY MOUTH TWICE DAILY   lipase/protease/amylase (CREON) 36000 UNITS CPEP capsule, Take 1 capsule by mouth in the morning, at noon, in the evening, and at bedtime.   Multiple Vitamin (MULTI-VITAMIN) tablet, Take by mouth daily. Take 1 Tablet Daily   nitrofurantoin, macrocrystal-monohydrate, (MACROBID) 100 MG capsule, Take 1 capsule (100 mg total) by mouth 2 (two) times daily.   olopatadine (PATANOL) 0.1 % ophthalmic solution, Place 1 drop into both eyes 2 (two) times daily.   ondansetron (ZOFRAN) 4 MG tablet, Take 1 tablet (4 mg total) by mouth every 8 (eight) hours as needed for nausea or vomiting.   PARoxetine (PAXIL) 10 MG tablet, Take 1 tablet (10 mg total) by mouth daily.   Vitamin D, Ergocalciferol, (DRISDOL) 1.25 MG (50000 UNIT) CAPS capsule, TAKE 1 CAPSULE BY MOUTH EVERY 7 DAYS   Semaglutide-Weight Management 0.25 MG/0.5ML SOAJ, Inject 0.25 mg into the skin once a week for 28 days. (Patient not taking: Reported on 02/25/2022)   [START ON 03/03/2022] Semaglutide-Weight Management 0.5 MG/0.5ML SOAJ, Inject 0.5 mg into the skin once a week for 28 days. (Patient not taking: Reported on 02/25/2022)   [START ON 04/01/2022] Semaglutide-Weight Management 1 MG/0.5ML SOAJ, Inject 1 mg into the skin once a week for 28 days. (Patient not taking: Reported on 02/25/2022)   [START ON 04/30/2022] Semaglutide-Weight Management 1.7 MG/0.75ML SOAJ, Inject 1.7 mg into the skin once  a week for 28 days. (Patient not taking: Reported on 02/25/2022)   [START ON 05/29/2022] Semaglutide-Weight Management 2.4 MG/0.75ML SOAJ, Inject 2.4 mg into the skin once a week for 28 days. (Patient not taking: Reported on 02/25/2022)   Reviewed prior external information including notes and imaging from  primary care provider As well as notes that were available from care everywhere and other healthcare systems.  Past medical history, social, surgical and family history all reviewed in electronic medical record.  No pertanent information unless stated regarding to the chief complaint.   Review of Systems:  No headache, visual changes, nausea, vomiting, diarrhea, constipation,  abdominal pain, skin rash, fevers, chills, night sweats, weight loss, swollen lymph nodes, body aches, chest pain, shortness of breath, mood changes. POSITIVE muscle aches, joint swelling, dizziness  Objective  Blood pressure (!) 140/70, pulse 74, height '5\' 4"'$  (1.626 m), SpO2 96 %.   General: No apparent distress alert and oriented x3 mood and affect normal, dressed appropriately.  HEENT: Pupils equal, extraocular movements intact  Respiratory: Patient's speak in full sentences and does not appear short of breath  Cardiovascular: No lower extremity edema, non tender, no erythema  Antalgic gait noted, instability of the right knee, limited ROM in all planes.    After informed written and verbal consent, patient was seated on exam table. Right knee was prepped with alcohol swab and  utilizing anterolateral approach, patient's right knee space was injected with 4:1  marcaine 0.5%: Kenalog '40mg'$ /dL. Patient tolerated the procedure well without immediate complications.     Impression and Recommendations:    The above documentation has been reviewed and is accurate and complete Lyndal Pulley, DO

## 2022-02-25 ENCOUNTER — Ambulatory Visit (INDEPENDENT_AMBULATORY_CARE_PROVIDER_SITE_OTHER): Payer: Medicare HMO

## 2022-02-25 ENCOUNTER — Ambulatory Visit: Payer: Medicare HMO | Admitting: Family Medicine

## 2022-02-25 VITALS — BP 140/70 | HR 74 | Ht 64.0 in

## 2022-02-25 DIAGNOSIS — M2550 Pain in unspecified joint: Secondary | ICD-10-CM

## 2022-02-25 DIAGNOSIS — R5383 Other fatigue: Secondary | ICD-10-CM | POA: Diagnosis not present

## 2022-02-25 DIAGNOSIS — M545 Low back pain, unspecified: Secondary | ICD-10-CM

## 2022-02-25 DIAGNOSIS — M1711 Unilateral primary osteoarthritis, right knee: Secondary | ICD-10-CM | POA: Diagnosis not present

## 2022-02-25 LAB — COMPREHENSIVE METABOLIC PANEL
ALT: 35 U/L (ref 0–35)
AST: 30 U/L (ref 0–37)
Albumin: 3.7 g/dL (ref 3.5–5.2)
Alkaline Phosphatase: 80 U/L (ref 39–117)
BUN: 19 mg/dL (ref 6–23)
CO2: 25 mEq/L (ref 19–32)
Calcium: 8.6 mg/dL (ref 8.4–10.5)
Chloride: 113 mEq/L — ABNORMAL HIGH (ref 96–112)
Creatinine, Ser: 0.83 mg/dL (ref 0.40–1.20)
GFR: 72.46 mL/min (ref >60.00)
Glucose, Bld: 84 mg/dL (ref 70–99)
Potassium: 3.4 mEq/L — ABNORMAL LOW (ref 3.5–5.1)
Sodium: 143 mEq/L (ref 135–145)
Total Bilirubin: 0.2 mg/dL (ref 0.2–1.2)
Total Protein: 6.2 g/dL (ref 6.0–8.3)

## 2022-02-25 LAB — CBC WITH DIFFERENTIAL/PLATELET
Basophils Absolute: 0 10*3/uL (ref 0.0–0.1)
Basophils Relative: 0.3 % (ref 0.0–3.0)
Eosinophils Absolute: 0.1 10*3/uL (ref 0.0–0.7)
Eosinophils Relative: 1.9 % (ref 0.0–5.0)
HCT: 34.6 % — ABNORMAL LOW (ref 36.0–46.0)
Hemoglobin: 11.1 g/dL — ABNORMAL LOW (ref 12.0–15.0)
Lymphocytes Relative: 31.2 % (ref 12.0–46.0)
Lymphs Abs: 1.9 10*3/uL (ref 0.7–4.0)
MCHC: 32 g/dL (ref 30.0–36.0)
MCV: 84.5 fl (ref 78.0–100.0)
Monocytes Absolute: 0.5 10*3/uL (ref 0.1–1.0)
Monocytes Relative: 8.6 % (ref 3.0–12.0)
Neutro Abs: 3.6 10*3/uL (ref 1.4–7.7)
Neutrophils Relative %: 58 % (ref 43.0–77.0)
Platelets: 234 10*3/uL (ref 150.0–400.0)
RBC: 4.1 Mil/uL (ref 3.87–5.11)
RDW: 16.3 % — ABNORMAL HIGH (ref 11.5–15.5)
WBC: 6.2 10*3/uL (ref 4.0–10.5)

## 2022-02-25 LAB — C-REACTIVE PROTEIN: CRP: <1 mg/dL (ref 0.5–20.0)

## 2022-02-25 LAB — TSH: TSH: 0.77 u[IU]/mL (ref 0.35–5.50)

## 2022-02-25 LAB — FERRITIN: Ferritin: 15.3 ng/mL (ref 10.0–291.0)

## 2022-02-25 LAB — VITAMIN B12: Vitamin B-12: 1016 pg/mL — ABNORMAL HIGH (ref 211–911)

## 2022-02-25 LAB — SEDIMENTATION RATE: Sed Rate: 15 mm/hr (ref 0–30)

## 2022-02-25 NOTE — Patient Instructions (Signed)
Good to see you Injection in right knee today  Get X-ray on your way out Get Labs on your way out See me again in 10 weeks

## 2022-02-26 ENCOUNTER — Encounter: Payer: Self-pay | Admitting: Family Medicine

## 2022-02-26 NOTE — Assessment & Plan Note (Signed)
Discussed with patient again at great length.  Chronic problem with worsening symptoms.  Still has intermittent radicular symptoms with patient's neck and lower back.  We discussed some of the weakness could be potentially coming from that as well the patient is stating.  We will get laboratory work-up for some of the intermittent dizziness she is having but does not seem to have any at this moment.  Vitals are stable.  Patient knows if worsening symptoms to seek medical attention.  Could be a potential candidate for viscosupplementation but has not responded extremely well to this in the past.  Patient will continue to work on her weight loss and core strengthening and follow-up with me again in 2 to 3 months

## 2022-03-02 LAB — VITAMIN D 1,25 DIHYDROXY
Vitamin D 1, 25 (OH)2 Total: 93 pg/mL — ABNORMAL HIGH (ref 18–72)
Vitamin D2 1, 25 (OH)2: 17 pg/mL
Vitamin D3 1, 25 (OH)2: 76 pg/mL

## 2022-03-16 ENCOUNTER — Encounter: Payer: Self-pay | Admitting: Internal Medicine

## 2022-03-23 ENCOUNTER — Other Ambulatory Visit: Payer: Self-pay | Admitting: Family Medicine

## 2022-03-24 ENCOUNTER — Ambulatory Visit: Payer: Medicare HMO | Admitting: Family Medicine

## 2022-03-24 DIAGNOSIS — M6289 Other specified disorders of muscle: Secondary | ICD-10-CM | POA: Diagnosis not present

## 2022-03-24 DIAGNOSIS — M6281 Muscle weakness (generalized): Secondary | ICD-10-CM | POA: Diagnosis not present

## 2022-03-24 DIAGNOSIS — M62838 Other muscle spasm: Secondary | ICD-10-CM | POA: Diagnosis not present

## 2022-03-24 DIAGNOSIS — N3946 Mixed incontinence: Secondary | ICD-10-CM | POA: Diagnosis not present

## 2022-03-24 DIAGNOSIS — R159 Full incontinence of feces: Secondary | ICD-10-CM | POA: Diagnosis not present

## 2022-03-25 ENCOUNTER — Ambulatory Visit: Payer: Medicare HMO | Admitting: Podiatry

## 2022-03-25 ENCOUNTER — Encounter: Payer: Self-pay | Admitting: Podiatry

## 2022-03-25 VITALS — BP 116/81

## 2022-03-25 DIAGNOSIS — M7672 Peroneal tendinitis, left leg: Secondary | ICD-10-CM | POA: Diagnosis not present

## 2022-03-25 DIAGNOSIS — M7671 Peroneal tendinitis, right leg: Secondary | ICD-10-CM

## 2022-03-25 DIAGNOSIS — M21619 Bunion of unspecified foot: Secondary | ICD-10-CM

## 2022-03-25 DIAGNOSIS — M216X9 Other acquired deformities of unspecified foot: Secondary | ICD-10-CM

## 2022-03-26 NOTE — Progress Notes (Signed)
Subjective:   Patient ID: [name], female   DOB: 66 y.o.   MRN: [id number]   HPI Patient presents stating she has severe structural deformity of her feet and she feels like she is becoming less stable on her right 1 and she is wondering about braces and whether they may provide benefit and does have lesions on the outside of both feet   ROS      Objective:  Physical Exam  Patient is noted to have reasonable neurovascular status does have fixed deformity of the right over left foot with a cavovarus type deformity noted with minimal motion of the subtalar joint with severe stress on the external aspect of both feet around the fifth metatarsal base especially     Assessment:  Very difficult condition with foot structure being in a fixed deformity that any brace would be tolerated versus other treatment options and training     Plan:  H&P reviewed x-rays do not see where we can do anything of value for her currently with the rigid nature of her contracture and her pathology.  At this point I did debride some tissue on the outside of her feet and it is healthy and there is no ulcerations and I advised her on the types of shoes I think would be of benefit to her and again we reviewed the difficulty of this deformity and correction  X-rays indicate severe fixed deformity cavovarus bilateral no indications of osteolysis moderate osteoarthritis noted

## 2022-03-31 DIAGNOSIS — M6289 Other specified disorders of muscle: Secondary | ICD-10-CM | POA: Diagnosis not present

## 2022-03-31 DIAGNOSIS — N3946 Mixed incontinence: Secondary | ICD-10-CM | POA: Diagnosis not present

## 2022-03-31 DIAGNOSIS — R159 Full incontinence of feces: Secondary | ICD-10-CM | POA: Diagnosis not present

## 2022-03-31 DIAGNOSIS — M62838 Other muscle spasm: Secondary | ICD-10-CM | POA: Diagnosis not present

## 2022-03-31 DIAGNOSIS — M6281 Muscle weakness (generalized): Secondary | ICD-10-CM | POA: Diagnosis not present

## 2022-04-01 ENCOUNTER — Ambulatory Visit (INDEPENDENT_AMBULATORY_CARE_PROVIDER_SITE_OTHER): Payer: Medicare HMO

## 2022-04-01 ENCOUNTER — Telehealth: Payer: Self-pay | Admitting: *Deleted

## 2022-04-01 ENCOUNTER — Ambulatory Visit: Payer: Medicare HMO | Admitting: Family Medicine

## 2022-04-01 ENCOUNTER — Ambulatory Visit: Payer: Self-pay

## 2022-04-01 VITALS — BP 128/80 | HR 83 | Ht 64.0 in

## 2022-04-01 DIAGNOSIS — M25552 Pain in left hip: Secondary | ICD-10-CM | POA: Diagnosis not present

## 2022-04-01 DIAGNOSIS — M542 Cervicalgia: Secondary | ICD-10-CM

## 2022-04-01 DIAGNOSIS — M1711 Unilateral primary osteoarthritis, right knee: Secondary | ICD-10-CM

## 2022-04-01 DIAGNOSIS — G8929 Other chronic pain: Secondary | ICD-10-CM | POA: Diagnosis not present

## 2022-04-01 DIAGNOSIS — M25561 Pain in right knee: Secondary | ICD-10-CM

## 2022-04-01 MED ORDER — HYDROCODONE-ACETAMINOPHEN 5-325 MG PO TABS: 1.0000 | ORAL_TABLET | Freq: Four times a day (QID) | ORAL | 0 refills | Status: DC | PRN

## 2022-04-01 MED ORDER — AMBULATORY NON FORMULARY MEDICATION: 0 refills | Status: DC

## 2022-04-01 NOTE — Telephone Encounter (Signed)
R knee Zilretta initiated through portal.

## 2022-04-01 NOTE — Progress Notes (Signed)
I, Peterson Lombard, LAT, ATC acting as a scribe for Lynne Leader, MD.  [name] is a 66 y.o. female who presents to [place] [facility] today for knee pain. Pt was previously seen by Dr. Tamala Julian on 02/25/22 for polyarthralgia, including R knee pain. Pt suffered a fall on last Thurs, 12/7, tripping on the leg of a chair, falling on her R side. Pt locates pain to midline of her neck, the anterior aspect of the L hip, and the anterior aspect of the R knee.   She had a right knee injection in early November with my partner Dr. Tamala Julian.  R Knee swelling: yes Mechanical symptoms: no Aggravates: walking Treatments tried: ice, heat, elevation, Tylenol, Gabapentin, Tramadol  Dx testing: 02/25/22 L-spine XR & Labs  02/06/20 R knee XR  Pertinent review of systems: No fevers or chills  Relevant historical information: Hypertension   Exam:  BP 128/80   Pulse 83   Ht '5\' 4"'$  (1.626 m)   SpO2 97%   BMI 42.48 kg/m  General: Well Developed, well nourished, and in no acute distress.   MSK: C-spine: Normal appearing Tender palpation paraspinal musculature and along cervical midline around C7 area.  Decreased cervical motion.  Active extremity strength is intact. Reflexes are intact.  Left hip: Normal-appearing Decreased range of motion.  Pain with hip flexion and decree strength to hip flexion.  Hip abduction strength is mildly diminished and mildly painful.  Right knee: Mild effusion normal motion with crepitation.  Tender palpation anterior knee. Intact strength.   Lab and Radiology Results  X-ray images C-spine, left hip, right knee obtained today personally and independently interpreted.  C-spine: Multilevel degenerative changes.  No obvious acute fracture.  Left hip: No acute fractures.  Mild hip OA.  Lumbar fusion present.  Right knee: Technical errors limited availability of these images at the time of the note.    Await formal radiology  review     Assessment and Plan: 66 y.o. female with fall occurring about 7 days ago injuring the right knee neck and left hip.  No fractures are visible per my read however radiology overread is still pending. Recommend using a walker and will prescribe hydrocodone for pain control.  Will refer to physical therapy.  Additionally try to get Zilretta authorized for the right knee is that should be helpful. Recheck soon as we can with Zilretta.   PDMP reviewed during this encounter. Orders Placed This Encounter  Procedures   DG Knee AP/LAT W/[place] Right    Standing Status:   Future    Number of Occurrences:   1    Standing Expiration Date:   05/02/2022    Order Specific Question:   Reason for Exam (SYMPTOM  OR DIAGNOSIS REQUIRED)    Answer:   right knee pain    Order Specific Question:   Preferred imaging location?    Answer:   [facility]   [place] LIMITED JOINT SPACE STRUCTURES LOW RIGHT(NO LINKED CHARGES)    Order Specific Question:   Reason for Exam (SYMPTOM  OR DIAGNOSIS REQUIRED)    Answer:   right knee pain    Order Specific Question:   Preferred imaging location?    Answer:   [facility]   DG HIP UNILAT WITH PELVIS 2-3 VIEWS LEFT    Standing Status:   Future    Number of Occurrences:   1    Standing Expiration Date:   04/02/2023  Order Specific Question:   Reason for Exam (SYMPTOM  OR DIAGNOSIS REQUIRED)    Answer:   eval left hip pain    Order Specific Question:   Preferred imaging location?    Answer:   [facility]   DG Cervical Spine Complete    Standing Status:   Future    Number of Occurrences:   1    Standing Expiration Date:   04/02/2023    Order Specific Question:   Reason for Exam (SYMPTOM  OR DIAGNOSIS REQUIRED)    Answer:   eval neck pain after fall    Order Specific Question:   Preferred imaging location?    Answer:   [facility]   Ambulatory referral to Physical Therapy    Referral Priority:   Routine     Referral Type:   Physical Medicine    Referral Reason:   Specialty Services Required    Requested Specialty:   Physical Therapy   Meds ordered this encounter  Medications   HYDROcodone-acetaminophen (NORCO/VICODIN) 5-325 MG tablet    Sig: Take 1 tablet by mouth every 6 (six) hours as needed.    Dispense:  15 tablet    Refill:  0   AMBULATORY NON FORMULARY MEDICATION    Sig: Walker Use as needed.  Disp 1    Dispense:  1 each    Refill:  0     Discussed warning signs or symptoms. Please see discharge instructions. Patient expresses understanding.   The above documentation has been reviewed and is accurate and complete Lynne Leader, M.D.

## 2022-04-01 NOTE — Patient Instructions (Signed)
Thank you for coming in today.   Please get an Xray today before you leave   I've referred you to Physical Therapy.  Let us know if you don't hear from them in one week.   I will try to get Zilretta authorized before the end of the year.  I have opening currently the last week of the year that we could use.  Lets see if we can get this done.   Schedule with me or Dr Glennon Mac ASAP for zilretta right knee once its authorized.

## 2022-04-02 ENCOUNTER — Other Ambulatory Visit: Payer: Self-pay | Admitting: Internal Medicine

## 2022-04-06 NOTE — Telephone Encounter (Signed)
R knee Zilretta approved. No auth required.

## 2022-04-07 DIAGNOSIS — R519 Headache, unspecified: Secondary | ICD-10-CM | POA: Diagnosis not present

## 2022-04-07 NOTE — Progress Notes (Unsigned)
Benito Mccreedy D.[place] Phone: [phone]   Assessment and Plan:     There are no diagnoses linked to this encounter.  ***   Pertinent previous records reviewed include ***   Follow Up: ***     Subjective:   I, Xaden Kaufman, am serving as a Education administrator for Doctor Glennon Mac  Chief Complaint: right knee pain   HPI:  04/01/2022 [name] is a 66 y.o. female who presents to [facility] today for knee pain. Pt was previously seen by Dr. Tamala Julian on 02/25/22 for polyarthralgia, including R knee pain. Pt suffered a fall on last Thurs, 12/7, tripping on the leg of a chair, falling on her R side. Pt locates pain to midline of her neck, the anterior aspect of the L hip, and the anterior aspect of the R knee.    She had a right knee injection in early November with my partner Dr. Tamala Julian.   R Knee swelling: yes Mechanical symptoms: no Aggravates: walking Treatments tried: ice, heat, elevation, Tylenol, Gabapentin, Tramadol   Dx testing: 02/25/22 L-spine XR & Labs             02/06/20 R knee XR   Pertinent review of systems: No fevers or chills   Relevant historical information: Hypertension  04/08/2022 Patient states   Relevant Historical Information: ***  Additional pertinent review of systems negative.   Current Outpatient Medications:    albuterol (PROVENTIL) (2.5 MG/3ML) 0.083% nebulizer solution, Take 3 mLs (2.5 mg total) by nebulization every 6 (six) hours as needed for wheezing or shortness of breath., Disp: 100 mL, Rfl: 1   albuterol (VENTOLIN HFA) 108 (90 Base) MCG/ACT inhaler, INHALE 2 PUFFS INTO THE LUNGS EVERY 6 HOURS AS NEEDED FOR WHEEZING OR SHORTNESS OF BREATH, Disp: 3 each, Rfl: 2   alendronate (FOSAMAX) 70 MG tablet, Take 1 tablet (70 mg total) by mouth every 7 (seven) days. Take with a full glass of water on an empty stomach., Disp: 12 tablet,  Rfl: 3   AMBULATORY NON FORMULARY MEDICATION, Walker Use as needed.  Disp 1, Disp: 1 each, Rfl: 0   amLODipine (NORVASC) 10 MG tablet, TAKE 1 TABLET EVERY DAY, Disp: 90 tablet, Rfl: 3   Ascorbic Acid (VITAMIN C) 1000 MG tablet, Take 1 tablet by mouth daily., Disp: , Rfl:    benzonatate (TESSALON) 100 MG capsule, Take 1 capsule (100 mg total) by mouth 3 (three) times daily as needed for cough. (Patient not taking: Reported on 02/02/2022), Disp: 30 capsule, Rfl: 0   buPROPion (WELLBUTRIN XL) 300 MG 24 hr tablet, TAKE 1 TABLET(300 MG) BY MOUTH DAILY, Disp: 90 tablet, Rfl: 3   ferrous sulfate 325 (65 FE) MG tablet, Take 1 tablet by mouth daily with breakfast., Disp: , Rfl:    fluticasone-salmeterol (ADVAIR) 100-50 MCG/ACT AEPB, Inhale 1 puff into the lungs 2 (two) times daily., Disp: 1 each, Rfl: 3   gabapentin (NEURONTIN) 100 MG capsule, TAKE 2 CAPSULES(200 MG) BY MOUTH TWICE DAILY, Disp: 180 capsule, Rfl: 0   HYDROcodone-acetaminophen (NORCO/VICODIN) 5-325 MG tablet, Take 1 tablet by mouth every 6 (six) hours as needed., Disp: 15 tablet, Rfl: 0   ipratropium (ATROVENT) 0.03 % nasal spray, Place 2 sprays into both nostrils every 12 (twelve) hours. X 5-7 days, Disp: 30 mL, Rfl: 0   levocetirizine (XYZAL) 5 MG tablet, Take 1 tablet (5 mg total) by mouth every evening.,  Disp: 30 tablet, Rfl: 0   lipase/protease/amylase (CREON) 36000 UNITS CPEP capsule, Take 1 capsule by mouth in the morning, at noon, in the evening, and at bedtime., Disp: , Rfl:    montelukast (SINGULAIR) 10 MG tablet, Take 1 tablet (10 mg total) by mouth at bedtime., Disp: 90 tablet, Rfl: 3   Multiple Vitamin (MULTI-VITAMIN) tablet, Take by mouth daily. Take 1 Tablet Daily, Disp: , Rfl:    olopatadine (PATANOL) 0.1 % ophthalmic solution, Place 1 drop into both eyes 2 (two) times daily., Disp: 5 mL, Rfl: 12   ondansetron (ZOFRAN) 4 MG tablet, Take 1 tablet (4 mg total) by mouth every 8 (eight) hours as needed for nausea or vomiting., Disp:  20 tablet, Rfl: 0   PARoxetine (PAXIL) 10 MG tablet, Take 1 tablet (10 mg total) by mouth daily., Disp: 90 tablet, Rfl: 3   Semaglutide-Weight Management 1 MG/0.5ML SOAJ, Inject 1 mg into the skin once a week for 28 days. (Patient not taking: Reported on 02/25/2022), Disp: 2 mL, Rfl: 0   [START ON 04/30/2022] Semaglutide-Weight Management 1.7 MG/0.75ML SOAJ, Inject 1.7 mg into the skin once a week for 28 days. (Patient not taking: Reported on 02/25/2022), Disp: 3 mL, Rfl: 0   [START ON 05/29/2022] Semaglutide-Weight Management 2.4 MG/0.75ML SOAJ, Inject 2.4 mg into the skin once a week for 28 days. (Patient not taking: Reported on 02/25/2022), Disp: 3 mL, Rfl: 0   torsemide (DEMADEX) 20 MG tablet, Take 1 tablet (20 mg total) by mouth 3 (three) times a week. May take an additional '20mg'$  ( 1 tablets) as needed on the other days, Disp: 90 tablet, Rfl: 3   traMADol (ULTRAM) 50 MG tablet, Take 1 tablet (50 mg total) by mouth every 6 (six) hours as needed. Needs visit for future refills., Disp: 120 tablet, Rfl: 0   Vitamin D, Ergocalciferol, (DRISDOL) 1.25 MG (50000 UNIT) CAPS capsule, TAKE 1 CAPSULE BY MOUTH EVERY 7 DAYS, Disp: 12 capsule, Rfl: 0   Objective:     There were no vitals filed for this visit.    There is no height or weight on file to calculate BMI.    Physical Exam:    ***   Electronically signed by:  Benito Mccreedy D.[facility] 12:21 PM 04/07/22

## 2022-04-08 ENCOUNTER — Other Ambulatory Visit: Payer: Self-pay | Admitting: Internal Medicine

## 2022-04-08 ENCOUNTER — Ambulatory Visit: Payer: Medicare HMO | Admitting: Sports Medicine

## 2022-04-08 DIAGNOSIS — M25561 Pain in right knee: Secondary | ICD-10-CM

## 2022-04-08 DIAGNOSIS — G8929 Other chronic pain: Secondary | ICD-10-CM | POA: Diagnosis not present

## 2022-04-08 DIAGNOSIS — M1711 Unilateral primary osteoarthritis, right knee: Secondary | ICD-10-CM

## 2022-04-08 MED ORDER — TRIAMCINOLONE ACETONIDE 32 MG IX SRER
32.0000 mg | Freq: Once | INTRA_ARTICULAR | Status: AC
  Administered 2022-04-08: 32 mg via INTRA_ARTICULAR

## 2022-04-09 NOTE — Progress Notes (Signed)
Left hip x-ray shows no fractures.  No severe arthritis is visible.

## 2022-04-09 NOTE — Progress Notes (Signed)
Right knee x-ray shows medium arthritis changes.

## 2022-04-09 NOTE — Progress Notes (Signed)
Cervical spine x-ray shows some arthritis.  It looks a little unusual to the radiologist and they cannot tell for sure.  If your neck still hurts we should get a CT scan of the neck.  Does it still hurt?

## 2022-04-16 ENCOUNTER — Encounter: Payer: Self-pay | Admitting: Family Medicine

## 2022-04-26 ENCOUNTER — Ambulatory Visit: Payer: Medicare HMO | Admitting: Physical Therapy

## 2022-04-26 NOTE — Therapy (Incomplete)
OUTPATIENT PHYSICAL THERAPY EVALUATION   Patient Name: [name] MRN: [id number] DOB:Sep 01, 1955, 67 y.o., female Today's Date: 04/26/2022  END OF SESSION:   Past Medical History:  Diagnosis Date   Anemia    takes Ferrous Sulfate daily   Anxiety    takes Citalopram daily   Arthritis    Asthma    2004-prior to gastric bypass and no problems since   CHF (congestive heart failure) ([place])    takes Lasix daily as needed   Chronic back pain    spondylolisthesis/stenosis/radiculopathy   Complication of anesthesia yrs ago   slow to wake up   Depression    Diabetes ([place])    DVT (deep venous thrombosis) ([place]) 01/17/2013   past hx. -tx.5-6 yrs ago bilateral legs, occ. sporadic swelling, has IVC filter implanted   Dysrhythmia    "heart tends to flutter"   Fibromyalgia    Fracture    right foot and is in a cam boot   Gallstones    GERD (gastroesophageal reflux disease)    hx of-no meds now   Heart murmur    History of bronchitis 2012 or 2013   History of colon polyps    Hypertension    takes Metoprolol daily   Insomnia    takes Melatonin daily   Pelvic floor dysfunction    Peripheral neuropathy    Pneumonia 90's   hx of   S/P gastric bypass 2003   Sleep apnea    no cpap used in many yrs after weight lost-no machine now   Urinary frequency    Urinary urgency    Vitamin D deficiency    Past Surgical History:  Procedure Laterality Date   BALLOON DILATION N/A 01/31/2013   Procedure: BALLOON DILATION;  Surgeon: Arta Silence, MD;  Location: [facility];  Service: Endoscopy;  Laterality: N/A;   CARDIAC EVENT MONITOR  03/2019   Predominantly sinus rhythm.  Rates range from 48-124 bpm.  Average 74 bpm.  Frequent short bursts (3-15 beats) PAT/PSVT--not indicated as being symptomatic on diary.  Otherwise rare PACs and PVCs.   CARDIAC EVENT MONITOR  08/2021   14-Day Zio Patch: Predominantly sinus rhythm with rate range 45 to 135 bpm.  Average 73 bpm.  Occasional (1.3%)  PACs with rare PVCs and rare PAC couplets.  24 episodes of atrial runs: Fastest was 11 beats at a rate of 184 bpm, longest was 13 beats with a rate of 92 bpm.  No sustained tachyarrhythmias or bradycardia arrhythmias.  No atrial fibrillation or flutter.   CARDIOPULMONARY EXERCISE STRESS TEST (CPX)  10/27/2021   Good effort despite dyspnea and wheezing.  Modified Naughton protocol on treadmill.  Normal heart rate response.  No sustained arrhythmias.  Preexercise spirometry normal.  Low normal peak VO2. Low normal functional capacity with NO Clear Cardiopulmonary Limitation.  Overall limitation primarily due to obesity & deconditioning, along with a component of Diastolic Dysfunction   CHOLECYSTECTOMY  2007   COLONOSCOPY     CT CTA CORONARY W/CA SCORE W/CM &/OR WO/CM  09/21/2019   Coronary Calcium Score 0.  Normal RCA dominant coronary anatomy.  CAD RADS 1-minimal nonobstructive CAD (0-24%).  Consider nonatherosclerotic cause for chest pain.  Consider preventative therapy and risk factor modification.   DILATION AND CURETTAGE OF UTERUS  yrs ago   ESOPHAGOGASTRODUODENOSCOPY (EGD) WITH PROPOFOL  03/01/2012   Procedure: ESOPHAGOGASTRODUODENOSCOPY (EGD) WITH PROPOFOL;  Surgeon: Arta Silence, MD;  Location: [facility];  Service: Endoscopy;  Laterality: N/A;   ESOPHAGOGASTRODUODENOSCOPY (EGD)  WITH PROPOFOL N/A 01/31/2013   Procedure: ESOPHAGOGASTRODUODENOSCOPY (EGD) WITH PROPOFOL;  Surgeon: Arta Silence, MD;  Location: [facility];  Service: Endoscopy;  Laterality: N/A;   ESOPHAGOGASTRODUODENOSCOPY (EGD) WITH PROPOFOL N/A 09/02/2015   Procedure: ESOPHAGOGASTRODUODENOSCOPY (EGD) WITH PROPOFOL;  Surgeon: Gatha Mayer, MD;  Location: [facility];  Service: Endoscopy;  Laterality: N/A;   GASTRIC BY-PASS  2004   HERNIA REPAIR  2005   INSERTION OF VENA CAVA FILTER  01/17/2013   inserted 2004- "abdomen"   LIGAMENT REPAIR Right 1987   Rt. knee scope   MAXIMUM ACCESS (MAS)POSTERIOR LUMBAR INTERBODY  FUSION (PLIF) 2 LEVEL N/A 06/07/2014   Procedure: L4-5 L5-S1 FOR MAXIMUM ACCESS (MAS) POSTERIOR LUMBAR INTERBODY FUSION ;  Surgeon: Erline Levine, MD;  Location: [place] [facility];  Service: Neurosurgery;  Laterality: N/A;  L4-5 L5-S1 FOR MAXIMUM ACCESS (MAS) POSTERIOR LUMBAR INTERBODY FUSION    TONSILLECTOMY     as child   TRANSTHORACIC ECHOCARDIOGRAM  12/2020   EF 55 to 60%.  No RWMA.  Mild LVH.  GR 1 DD-moderate LA dilation..  Mild MR..  Normal RV size/function.  Normal PAP.  Normal RAP. => Essentially stable from December 2020   Patient Active Problem List   Diagnosis Date Noted   Allergic conjunctivitis 02/03/2022   Sinus symptom 01/01/2022   Abnormal sense of taste 10/16/2021   Community acquired pneumonia 07/23/2021   Greater trochanteric bursitis, right 06/12/2021   Acromioclavicular arthrosis 06/12/2021   Pes anserinus bursitis of right knee 04/24/2021   Left hip pain 03/06/2021   Vitamin D deficiency 02/04/2021   Chest pain on breathing 08/11/2020   Closed fracture of left distal radius 05/01/2020   Recurrent Clostridioides difficile infection 02/28/2020   Pyelonephritis 02/28/2020   Abnormal urine odor 08/10/2019   Gastrointestinal complaint 07/05/2019   Muscle strain of gluteal region, right, initial encounter 05/11/2019   DOE (dyspnea on exertion) 03/13/2019   Rapid heartbeat 90/24/0973   Systolic ejection murmur 53/29/9242   Leg swelling 10/09/2018   Insomnia 05/19/2018   Frequent refractory urinary tract infections 05/05/2018   Right leg pain 12/16/2017   History of deep vein thrombosis (DVT) of lower extremity 12/12/2017   Cavovarus deformity of foot, acquired, unspecified laterality 04/07/2017   S/P gastric bypass 08/18/2016   Muscle cramps 07/09/2016   Adjustment disorder with mixed anxiety and depressed mood 03/05/2016   Fatigue 03/05/2016   Cervical disc disorder with radiculopathy of cervical region 02/27/2016   Degenerative arthritis of right knee 02/27/2016    Right knee pain 02/24/2016   Left shoulder pain 02/24/2016   Routine general medical examination at a health care facility 12/12/2015   Schatzki's ring    Diarrhea of presumed infectious origin 06/01/2015   Lumbar radiculopathy 03/05/2015   Peripheral neuropathy 01/17/2015   Chest pain with low risk for cardiac etiology 05/21/2013   Fibromyalgia 02/21/2011   Essential hypertension 12/04/2006   Hx of diabetes mellitus 10/20/2006   Morbid obesity ([place]) 10/20/2006   MDD (major depressive disorder) ([place]) 10/20/2006   OBSTRUCTIVE SLEEP APNEA 10/20/2006   OSTEOARTHRITIS 10/20/2006    PCP: Hoyt Koch, MD  REFERRING PROVIDER: Gregor Hams, MD  REFERRING DIAG:  Primary osteoarthritis of right knee Chronic pain of right knee Left hip pain Neck pain  THERAPY DIAG:  No diagnosis found.  Rationale for Evaluation and Treatment: Rehabilitation  ONSET DATE: ***   SUBJECTIVE:  SUBJECTIVE STATEMENT: ***  PERTINENT HISTORY: ***  PAIN:  Are you having pain? Yes:  NPRS scale: ***/10  Pain location: *** Pain description: *** Aggravating factors: *** Relieving factors: ***  PRECAUTIONS: Fall  WEIGHT BEARING RESTRICTIONS: No  FALLS:  Has patient fallen in last 6 months? Yes. Number of falls ***  LIVING ENVIRONMENT: Lives with: {[facility] lives with:25569::"lives with their family"} Lives in: {Lives in:25570} Stairs: {opstairs:27293} Has following equipment at home: {Assistive devices:23999}  OCCUPATION: ***  PLOF: Independent  PATIENT GOALS: ***   OBJECTIVE:  PATIENT SURVEYS:  FOTO ***  COGNITION: Overall cognitive status: Within functional limits for tasks assessed     SENSATION: WFL  EDEMA:  {edema:24020}  MUSCLE LENGTH: ***  POSTURE:  ***  PALPATION: ***  LOWER EXTREMITY ROM:  Active ROM Right eval Left eval  Knee flexion    Knee extension     (Blank rows = not tested)  LOWER EXTREMITY MMT:  MMT Right eval Left eval  Hip  flexion    Hip extension    Hip abduction    Knee flexion    Knee extension    Ankle dorsiflexion    Ankle plantarflexion    Ankle inversion    Ankle eversion     (Blank rows = not tested)  CERVICAL ROM:   Active ROM A/PROM (deg) eval  Flexion   Extension   Right lateral flexion   Left lateral flexion   Right rotation   Left rotation    (Blank rows = not tested)  UPPER EXTREMITY ROM:  {AROM/PROM:27142} ROM Right eval Left eval  Shoulder flexion    Shoulder extension    Shoulder abduction    Shoulder adduction    Shoulder extension    Shoulder internal rotation    Shoulder external rotation    Elbow flexion    Elbow extension    Wrist flexion    Wrist extension    Wrist ulnar deviation    Wrist radial deviation    Wrist pronation    Wrist supination     (Blank rows = not tested)   UPPER EXTREMITY MMT:  MMT Right eval Left eval  Shoulder flexion    Shoulder extension    Shoulder abduction    Shoulder internal rotation    Shoulder external rotation    Middle trapezius    Lower trapezius    Elbow flexion    Elbow extension    Wrist flexion    Wrist extension    Wrist ulnar deviation    Wrist radial deviation    Wrist pronation    Wrist supination    Grip strength     (Blank rows = not tested)   FUNCTIONAL TESTS:  {Functional tests:24029}  GAIT: Distance walked: *** Assistive device utilized: {Assistive devices:23999} Level of assistance: {Levels of assistance:24026} Comments: ***   TODAY'S TREATMENT:   [facility] Adult PT Treatment:                                                DATE: 04/26/2022 Therapeutic Exercise: ***  PATIENT EDUCATION:  Education details: Exam findings, POC, HEP Person educated: Patient Education method: Explanation, Demonstration, Tactile cues, Verbal cues, and Handouts Education comprehension: verbalized understanding, returned demonstration, verbal cues required, tactile cues required, and needs further  education  HOME EXERCISE PROGRAM: ***   ASSESSMENT: CLINICAL IMPRESSION: Patient is a 67 y.o. female who was seen today for physical therapy evaluation and treatment for ***.   OBJECTIVE IMPAIRMENTS: {  opptimpairments:25111}.   ACTIVITY LIMITATIONS: {activitylimitations:27494}  PARTICIPATION LIMITATIONS: {participationrestrictions:25113}  PERSONAL FACTORS: {Personal factors:25162} are also affecting patient's functional outcome.   REHAB POTENTIAL: {rehabpotential:25112}  CLINICAL DECISION MAKING: {clinical decision making:25114}  EVALUATION COMPLEXITY: {Evaluation complexity:25115}   GOALS: Goals reviewed with patient? Yes  SHORT TERM GOALS: Target date: ***  Patient will be I with initial HEP in order to progress with therapy. Baseline: Goal status: INITIAL  2.  PT will review FOTO with patient by 3rd visit in order to understand expected progress and outcome with therapy. Baseline:  Goal status: INITIAL  3.  *** Baseline:  Goal status: INITIAL  LONG TERM GOALS: Target date: ***  Patient will be I with final HEP to maintain progress from PT. Baseline:  Goal status: INITIAL  2.  Patient will report >/= ***% status on FOTO to indicate improved functional ability. Baseline:  Goal status: INITIAL  3.  *** Baseline:  Goal status: {GOALSTATUS:25110}  4.  *** Baseline:  Goal status: {GOALSTATUS:25110}   PLAN: PT FREQUENCY: {rehab frequency:25116}  PT DURATION: {rehab duration:25117}  PLANNED INTERVENTIONS: {rehab planned interventions:25118::"Therapeutic exercises","Therapeutic activity","Neuromuscular re-education","Balance training","Gait training","Patient/Family education","Self Care","Joint mobilization"}  PLAN FOR NEXT SESSION: Review HEP and progress PRN, ***   Hilda Blades, PT, DPT, LAT, ATC 04/26/22  7:59 AM Phone: [phone] Fax: [phone]   Referring diagnosis? *** Treatment diagnosis? (if different than referring diagnosis)  *** What was this (referring dx) caused by? '[]'$  Surgery '[x]'$  Fall '[x]'$  Ongoing issue '[x]'$  Arthritis '[]'$  Other: ____________  Laterality: '[]'$  Rt '[]'$  Lt '[x]'$  Both  Check all possible CPT codes:  *CHOOSE 10 OR LESS*    '[]'$  30076 (Therapeutic Exercise)  '[]'$  92507 (SLP Treatment)  '[]'$  97112 (Neuro Re-ed)   '[]'$  92526 (Swallowing Treatment)   '[]'$  97116 (Gait Training)   '[]'$  [id number] (Cognitive Training, 1st 15 minutes) '[]'$  97140 (Manual Therapy)   '[]'$  97130 (Cognitive Training, each add'l 15 minutes)  '[]'$  97164 (Re-evaluation)                              '[]'$  Other, List CPT Code ____________  '[]'$  97530 (Therapeutic Activities)     '[]'$  97535 (Self Care)   '[x]'$  All codes above (97110 - 97535)  '[]'$  97012 (Mechanical Traction)  '[]'$  97014 (E-stim Unattended)  '[]'$  97032 (E-stim manual)  '[]'$  97033 (Ionto)  '[]'$  97035 (Ultrasound) '[]'$  97750 (Physical Performance Training) '[x]'$  [id number] (Aquatic Therapy) '[]'$  97016 (Vasopneumatic Device) '[]'$  [id number] (Paraffin) '[]'$  97034 (Contrast Bath) '[]'$  97597 (Wound Care 1st 20 sq cm) '[]'$  97598 (Wound Care each add'l 20 sq cm) '[]'$  97760 (Orthotic Fabrication, Fitting, Training Initial) '[]'$  [id number] (Prosthetic Management and Training Initial) '[]'$  [id number] (Orthotic or Prosthetic Training/ Modification Subsequent)

## 2022-05-04 NOTE — Progress Notes (Signed)
[name] [place] Phone: [phone] Subjective:   [name], am serving as a scribe for Dr. Hulan Saas.  I'm seeing this patient by the request  of:  Hoyt Koch, MD  CC: right knee pain   VEL:[id number]  02/25/2022 Discussed with patient again at great length.  Chronic problem with worsening symptoms.  Still has intermittent radicular symptoms with patient's neck and lower back.  We discussed some of the weakness could be potentially coming from that as well the patient is stating.  We will get laboratory work-up for some of the intermittent dizziness she is having but does not seem to have any at this moment.  Vitals are stable.  Patient knows if worsening symptoms to seek medical attention.  Could be a potential candidate for viscosupplementation but has not responded extremely well to this in the past.  Patient will continue to work on her weight loss and core strengthening and follow-up with me again in 2 to 3 months     Update 05/06/2022 [name] is a 67 y.o. female coming in with complaint of R knee pain. Saw Dr. Georgina Snell and Dr. Glennon Mac in December 2023. Injection by Dr. Glennon Mac on 04/08/2022. Patient states the injection did help her knee, but her neck is still really bothering her. States when she moves her neck it will start to bother it and will make her have headaches. Patent would like to go over the x rays.   Reviewed patient's x-rays that did show patient did have an abnormality noted that seems to be some type of possible sclerosis of the dens.     Past Medical History:  Diagnosis Date   Anemia    takes Ferrous Sulfate daily   Anxiety    takes Citalopram daily   Arthritis    Asthma    2004-prior to gastric bypass and no problems since   CHF (congestive heart failure) ([place])    takes Lasix daily as needed   Chronic back pain    spondylolisthesis/stenosis/radiculopathy    Complication of anesthesia yrs ago   slow to wake up   Depression    Diabetes ([place])    DVT (deep venous thrombosis) ([place]) 01/17/2013   past hx. -tx.5-6 yrs ago bilateral legs, occ. sporadic swelling, has IVC filter implanted   Dysrhythmia    "heart tends to flutter"   Fibromyalgia    Fracture    right foot and is in a cam boot   Gallstones    GERD (gastroesophageal reflux disease)    hx of-no meds now   Heart murmur    History of bronchitis 2012 or 2013   History of colon polyps    Hypertension    takes Metoprolol daily   Insomnia    takes Melatonin daily   Pelvic floor dysfunction    Peripheral neuropathy    Pneumonia 90's   hx of   S/P gastric bypass 2003   Sleep apnea    no cpap used in many yrs after weight lost-no machine now   Urinary frequency    Urinary urgency    Vitamin D deficiency    Past Surgical History:  Procedure Laterality Date   BALLOON DILATION N/A 01/31/2013   Procedure: BALLOON DILATION;  Surgeon: Arta Silence, MD;  Location: [facility];  Service: Endoscopy;  Laterality: N/A;   CARDIAC EVENT MONITOR  03/2019   Predominantly sinus rhythm.  Rates range from 48-124 bpm.  Average 74 bpm.  Frequent short bursts (3-15 beats) PAT/PSVT--not indicated as being symptomatic on diary.  Otherwise rare PACs and PVCs.   CARDIAC EVENT MONITOR  08/2021   14-Day Zio Patch: Predominantly sinus rhythm with rate range 45 to 135 bpm.  Average 73 bpm.  Occasional (1.3%) PACs with rare PVCs and rare PAC couplets.  24 episodes of atrial runs: Fastest was 11 beats at a rate of 184 bpm, longest was 13 beats with a rate of 92 bpm.  No sustained tachyarrhythmias or bradycardia arrhythmias.  No atrial fibrillation or flutter.   CARDIOPULMONARY EXERCISE STRESS TEST (CPX)  10/27/2021   Good effort despite dyspnea and wheezing.  Modified Naughton protocol on treadmill.  Normal heart rate response.  No sustained arrhythmias.  Preexercise spirometry normal.  Low normal peak VO2. Low  normal functional capacity with NO Clear Cardiopulmonary Limitation.  Overall limitation primarily due to obesity & deconditioning, along with a component of Diastolic Dysfunction   CHOLECYSTECTOMY  2007   COLONOSCOPY     CT CTA CORONARY W/CA SCORE W/CM &/OR WO/CM  09/21/2019   Coronary Calcium Score 0.  Normal RCA dominant coronary anatomy.  CAD RADS 1-minimal nonobstructive CAD (0-24%).  Consider nonatherosclerotic cause for chest pain.  Consider preventative therapy and risk factor modification.   DILATION AND CURETTAGE OF UTERUS  yrs ago   ESOPHAGOGASTRODUODENOSCOPY (EGD) WITH PROPOFOL  03/01/2012   Procedure: ESOPHAGOGASTRODUODENOSCOPY (EGD) WITH PROPOFOL;  Surgeon: Arta Silence, MD;  Location: [facility];  Service: Endoscopy;  Laterality: N/A;   ESOPHAGOGASTRODUODENOSCOPY (EGD) WITH PROPOFOL N/A 01/31/2013   Procedure: ESOPHAGOGASTRODUODENOSCOPY (EGD) WITH PROPOFOL;  Surgeon: Arta Silence, MD;  Location: [facility];  Service: Endoscopy;  Laterality: N/A;   ESOPHAGOGASTRODUODENOSCOPY (EGD) WITH PROPOFOL N/A 09/02/2015   Procedure: ESOPHAGOGASTRODUODENOSCOPY (EGD) WITH PROPOFOL;  Surgeon: Gatha Mayer, MD;  Location: [facility];  Service: Endoscopy;  Laterality: N/A;   GASTRIC BY-PASS  2004   HERNIA REPAIR  2005   INSERTION OF VENA CAVA FILTER  01/17/2013   inserted 2004- "abdomen"   LIGAMENT REPAIR Right 1987   Rt. knee scope   MAXIMUM ACCESS (MAS)POSTERIOR LUMBAR INTERBODY FUSION (PLIF) 2 LEVEL N/A 06/07/2014   Procedure: L4-5 L5-S1 FOR MAXIMUM ACCESS (MAS) POSTERIOR LUMBAR INTERBODY FUSION ;  Surgeon: Erline Levine, MD;  Location: [facility];  Service: Neurosurgery;  Laterality: N/A;  L4-5 L5-S1 FOR MAXIMUM ACCESS (MAS) POSTERIOR LUMBAR INTERBODY FUSION    TONSILLECTOMY     as child   TRANSTHORACIC ECHOCARDIOGRAM  12/2020   EF 55 to 60%.  No RWMA.  Mild LVH.  GR 1 DD-moderate LA dilation..  Mild MR..  Normal RV size/function.  Normal PAP.  Normal RAP. => Essentially stable  from December 2020   Social History   Socioeconomic History   Marital status: Widowed    Spouse name: Not on file   Number of children: 0   Years of education: college   Highest education level: Master's degree (e.g., MA, MS, MEng, MEd, MSW, MBA)  Occupational History   Occupation: retired  Tobacco Use   Smoking status: Former    Packs/day: 0.50    Years: 10.00    Total pack years: 5.00    Types: Cigarettes    Quit date: 04/19/2009    Years since quitting: 13.0   Smokeless tobacco: Never  Substance and Sexual Activity   Alcohol use: Yes    Alcohol/week: 0.0 standard drinks of alcohol    Comment: occ. social- wine-1 drink monthly   Drug  use: No   Sexual activity: Not Currently  Other Topics Concern   Not on file  Social History Narrative   Widow.  No kids.  Lives with her mother who is 86.  She does walk with a cane.  She is a retired Investment banker, corporate   She quit smoking in 2003   Social Determinants of Health   Financial Resource Strain: Low Risk  (09/29/2021)   Overall Financial Resource Strain (CARDIA)    Difficulty of Paying Living Expenses: Not hard at all  Food Insecurity: No Food Insecurity (09/29/2021)   Hunger Vital Sign    Worried About Running Out of Food in the Last Year: Never true    [place] in the Last Year: Never true  Transportation Needs: No Transportation Needs (09/29/2021)   PRAPARE - Hydrologist (Medical): No    Lack of Transportation (Non-Medical): No  Physical Activity: Sufficiently Active (09/29/2021)   Exercise Vital Sign    Days of Exercise per Week: 7 days    Minutes of Exercise per Session: 30 min  Stress: No Stress Concern Present (09/29/2021)   [place]    Feeling of Stress : Not at all  Social Connections: Unknown (09/29/2021)   Social Connection and Isolation Panel [NHANES]    Frequency of Communication with Friends and Family: More  than three times a week    Frequency of Social Gatherings with Friends and Family: Once a week    Attends Religious Services: Patient refused    Active Member of Clubs or Organizations: Yes    Attends Archivist Meetings: 1 to 4 times per year    Marital Status: Widowed   Allergies  Allergen Reactions   Carafate [Sucralfate] Hives, Itching and Other (See Comments)    Angioedema      Sulfonamide Derivatives Rash    Syncope   Family History  Problem Relation Age of Onset   Diabetes Mother    Atrial fibrillation Mother    Hypertension Mother    Heart disease Father        Unsure of details   Hypertension Father    Prostate cancer Father    Alzheimer's disease Father    Kidney disease Brother    Healthy Sister    Diabetes Maternal Grandmother    Heart disease Maternal Grandmother    Stroke Maternal Grandfather 63   Hypertension Maternal Grandfather    Hypertension Paternal Grandmother    Alzheimer's disease Paternal Grandmother    Colon cancer Neg Hx    Colon polyps Neg Hx    Stomach cancer Neg Hx    Esophageal cancer Neg Hx    Pancreatic cancer Neg Hx    Liver disease Neg Hx     Current Outpatient Medications (Endocrine & Metabolic):    alendronate (FOSAMAX) 70 MG tablet, Take 1 tablet (70 mg total) by mouth every 7 (seven) days. Take with a full glass of water on an empty stomach.  Current Outpatient Medications (Cardiovascular):    amLODipine (NORVASC) 10 MG tablet, TAKE 1 TABLET EVERY DAY   torsemide (DEMADEX) 20 MG tablet, Take 1 tablet (20 mg total) by mouth 3 (three) times a week. May take an additional '20mg'$  ( 1 tablets) as needed on the other days  Current Outpatient Medications (Respiratory):    albuterol (PROVENTIL) (2.5 MG/3ML) 0.083% nebulizer solution, Take 3 mLs (2.5 mg total) by nebulization every 6 (six) hours as  needed for wheezing or shortness of breath.   albuterol (VENTOLIN HFA) 108 (90 Base) MCG/ACT inhaler, INHALE 2 PUFFS INTO THE LUNGS  EVERY 6 HOURS AS NEEDED FOR WHEEZING OR SHORTNESS OF BREATH   benzonatate (TESSALON) 100 MG capsule, Take 1 capsule (100 mg total) by mouth 3 (three) times daily as needed for cough.   fluticasone-salmeterol (ADVAIR) 100-50 MCG/ACT AEPB, Inhale 1 puff into the lungs 2 (two) times daily.   ipratropium (ATROVENT) 0.03 % nasal spray, Place 2 sprays into both nostrils every 12 (twelve) hours. X 5-7 days   levocetirizine (XYZAL) 5 MG tablet, Take 1 tablet (5 mg total) by mouth every evening.   montelukast (SINGULAIR) 10 MG tablet, Take 1 tablet (10 mg total) by mouth at bedtime.  Current Outpatient Medications (Analgesics):    HYDROcodone-acetaminophen (NORCO/VICODIN) 5-325 MG tablet, Take 1 tablet by mouth every 6 (six) hours as needed.   traMADol (ULTRAM) 50 MG tablet, TAKE 1 TABLET(50 MG) BY MOUTH EVERY 6 HOURS AS NEEDED  Current Outpatient Medications (Hematological):    ferrous sulfate 325 (65 FE) MG tablet, Take 1 tablet by mouth daily with breakfast.  Current Outpatient Medications (Other):    AMBULATORY NON FORMULARY MEDICATION, Walker Use as needed.  Disp 1   Ascorbic Acid (VITAMIN C) 1000 MG tablet, Take 1 tablet by mouth daily.   buPROPion (WELLBUTRIN XL) 300 MG 24 hr tablet, TAKE 1 TABLET(300 MG) BY MOUTH DAILY   gabapentin (NEURONTIN) 100 MG capsule, TAKE 2 CAPSULES(200 MG) BY MOUTH TWICE DAILY   lipase/protease/amylase (CREON) 36000 UNITS CPEP capsule, Take 1 capsule by mouth in the morning, at noon, in the evening, and at bedtime.   Multiple Vitamin (MULTI-VITAMIN) tablet, Take by mouth daily. Take 1 Tablet Daily   olopatadine (PATANOL) 0.1 % ophthalmic solution, Place 1 drop into both eyes 2 (two) times daily.   ondansetron (ZOFRAN) 4 MG tablet, Take 1 tablet (4 mg total) by mouth every 8 (eight) hours as needed for nausea or vomiting.   PARoxetine (PAXIL) 10 MG tablet, Take 1 tablet (10 mg total) by mouth daily.   Semaglutide-Weight Management 1.7 MG/0.75ML SOAJ, Inject 1.7 mg  into the skin once a week for 28 days.   [START ON 05/29/2022] Semaglutide-Weight Management 2.4 MG/0.75ML SOAJ, Inject 2.4 mg into the skin once a week for 28 days.   Vitamin D, Ergocalciferol, (DRISDOL) 1.25 MG (50000 UNIT) CAPS capsule, TAKE 1 CAPSULE BY MOUTH EVERY 7 DAYS   Reviewed prior external information including notes and imaging from  primary care provider As well as notes that were available from care everywhere and other healthcare systems.  Past medical history, social, surgical and family history all reviewed in electronic medical record.  No pertanent information unless stated regarding to the chief complaint.   Review of Systems:  No headache, visual changes, nausea, vomiting, diarrhea, constipation, dizziness, abdominal pain, skin rash, fevers, chills, night sweats, weight loss, swollen lymph nodes, body aches, joint swelling, chest pain, shortness of breath, mood changes. POSITIVE muscle aches  Objective  Blood pressure 138/80, pulse 67, height '5\' 4"'$  (1.626 m), SpO2 97 %.   General: No apparent distress alert and oriented x3 mood and affect normal, dressed appropriately.  HEENT: Pupils equal, extraocular movements intact  Respiratory: Patient's speak in full sentences and does not appear short of breath  Cardiovascular: No lower extremity edema, non tender, no erythema  Patient is not does have some loss of lordosis that is fairly significant.  No sidebending noted.  With  any extension greater than 5 degrees patient does state that there is some radicular symptoms.  Patient does have good flexion but cites also once she gets past 15 degrees seems to have more radicular symptoms in the hands but states that it is nonspecific.  Significant arthritic changes in the hands which makes it difficult to do strength testing. Antalgic gait noted.  Uses a cane at this time.  4-5 strength that is symmetric in the lower extremities.    Impression and Recommendations:    The above  documentation has been reviewed and is accurate and complete Jessica Pulley, DO

## 2022-05-06 ENCOUNTER — Ambulatory Visit: Payer: Medicare HMO | Admitting: Family Medicine

## 2022-05-06 VITALS — BP 138/80 | HR 67 | Ht 64.0 in

## 2022-05-06 DIAGNOSIS — M5010 Cervical disc disorder with radiculopathy, unspecified cervical region: Secondary | ICD-10-CM | POA: Diagnosis not present

## 2022-05-06 DIAGNOSIS — M542 Cervicalgia: Secondary | ICD-10-CM

## 2022-05-06 NOTE — Patient Instructions (Addendum)
Good to see you  CT cervical ordered call [phone] to schedule  MRI cervical ordered  If anything gets a lot worse seek medical attention We will be in touch when the results are in

## 2022-05-06 NOTE — Assessment & Plan Note (Addendum)
Known arthritis with increasing instability of the neck.  The patient has worsening pain with flexion and extension radicular symptoms down both arms.  Patient is having increasing weakness of the proximal musculature of the shoulders bilaterally as well.  Patient's x-rays do have an abnormality noted of the dens that we do need to rule out some type of stress reaction that could be contributing.  Would like to do a CT scan of the cervical spine as well as consider an MRI of the cervical spine without contrast.  MRI to further evaluate for any white matter or neuromuscular diseases that could also be contributing with any nerve impingement.  Follow-up with me again after imaging to discuss further.  Patient knows of worsening symptoms to seek medical attention immediately.  Continue with tramadol with Tylenol  After discussing with patient, going over other providers notes, as well as reviewing the x-ray and going over pictures with patient 37 minutes

## 2022-05-07 ENCOUNTER — Encounter: Payer: Self-pay | Admitting: Family Medicine

## 2022-05-10 ENCOUNTER — Other Ambulatory Visit: Payer: Medicare HMO

## 2022-05-21 ENCOUNTER — Ambulatory Visit
Admission: RE | Admit: 2022-05-21 | Discharge: 2022-05-21 | Disposition: A | Discharge status: Home / Self Care | Payer: Medicare HMO | Source: Ambulatory Visit | Attending: Family Medicine | Admitting: Family Medicine

## 2022-05-21 DIAGNOSIS — M542 Cervicalgia: Secondary | ICD-10-CM | POA: Diagnosis not present

## 2022-05-24 ENCOUNTER — Encounter: Payer: Self-pay | Admitting: Family Medicine

## 2022-05-25 ENCOUNTER — Other Ambulatory Visit: Payer: Self-pay

## 2022-05-25 DIAGNOSIS — M5412 Radiculopathy, cervical region: Secondary | ICD-10-CM

## 2022-05-28 ENCOUNTER — Other Ambulatory Visit: Payer: Self-pay | Admitting: Internal Medicine

## 2022-05-28 MED ORDER — TRAMADOL HCL 50 MG PO TABS: ORAL_TABLET | ORAL | 0 refills | Status: DC

## 2022-06-10 ENCOUNTER — Ambulatory Visit
Admission: RE | Admit: 2022-06-10 | Discharge: 2022-06-10 | Disposition: A | Discharge status: Home / Self Care | Payer: Medicare HMO | Source: Ambulatory Visit | Attending: Family Medicine | Admitting: Family Medicine

## 2022-06-10 DIAGNOSIS — M4722 Other spondylosis with radiculopathy, cervical region: Secondary | ICD-10-CM | POA: Diagnosis not present

## 2022-06-10 DIAGNOSIS — M5412 Radiculopathy, cervical region: Secondary | ICD-10-CM

## 2022-06-10 MED ORDER — IOPAMIDOL (ISOVUE-M 300) INJECTION 61%
1.0000 mL | Freq: Once | INTRAMUSCULAR | Status: AC
  Administered 2022-06-10: 1 mL via EPIDURAL

## 2022-06-10 MED ORDER — TRIAMCINOLONE ACETONIDE 40 MG/ML IJ SUSP (RADIOLOGY)
60.0000 mg | Freq: Once | INTRAMUSCULAR | Status: AC
  Administered 2022-06-10: 40 mg via EPIDURAL

## 2022-06-10 NOTE — Discharge Instructions (Signed)

## 2022-06-17 ENCOUNTER — Emergency Department (HOSPITAL_COMMUNITY): Payer: Medicare HMO

## 2022-06-17 ENCOUNTER — Encounter (HOSPITAL_COMMUNITY): Payer: Self-pay

## 2022-06-17 ENCOUNTER — Observation Stay (HOSPITAL_COMMUNITY)
Admission: EM | Admit: 2022-06-17 | Discharge: 2022-06-24 | Disposition: A | Discharge status: Skilled Nursing Facility | Payer: Medicare HMO | Attending: General Surgery | Admitting: General Surgery

## 2022-06-17 ENCOUNTER — Other Ambulatory Visit: Payer: Self-pay

## 2022-06-17 DIAGNOSIS — M4316 Spondylolisthesis, lumbar region: Secondary | ICD-10-CM | POA: Diagnosis not present

## 2022-06-17 DIAGNOSIS — G8929 Other chronic pain: Secondary | ICD-10-CM | POA: Insufficient documentation

## 2022-06-17 DIAGNOSIS — J811 Chronic pulmonary edema: Secondary | ICD-10-CM | POA: Diagnosis not present

## 2022-06-17 DIAGNOSIS — S0990XA Unspecified injury of head, initial encounter: Secondary | ICD-10-CM | POA: Insufficient documentation

## 2022-06-17 DIAGNOSIS — M25552 Pain in left hip: Secondary | ICD-10-CM | POA: Diagnosis not present

## 2022-06-17 DIAGNOSIS — M545 Low back pain, unspecified: Secondary | ICD-10-CM

## 2022-06-17 DIAGNOSIS — R109 Unspecified abdominal pain: Secondary | ICD-10-CM | POA: Diagnosis not present

## 2022-06-17 DIAGNOSIS — J9811 Atelectasis: Secondary | ICD-10-CM | POA: Diagnosis not present

## 2022-06-17 DIAGNOSIS — R0789 Other chest pain: Secondary | ICD-10-CM | POA: Diagnosis not present

## 2022-06-17 DIAGNOSIS — G8911 Acute pain due to trauma: Secondary | ICD-10-CM | POA: Diagnosis not present

## 2022-06-17 DIAGNOSIS — Y92410 Unspecified street and highway as the place of occurrence of the external cause: Secondary | ICD-10-CM | POA: Insufficient documentation

## 2022-06-17 DIAGNOSIS — I110 Hypertensive heart disease with heart failure: Secondary | ICD-10-CM | POA: Insufficient documentation

## 2022-06-17 DIAGNOSIS — M5459 Other low back pain: Secondary | ICD-10-CM | POA: Insufficient documentation

## 2022-06-17 DIAGNOSIS — Z79899 Other long term (current) drug therapy: Secondary | ICD-10-CM | POA: Insufficient documentation

## 2022-06-17 DIAGNOSIS — T07XXXA Unspecified multiple injuries, initial encounter: Secondary | ICD-10-CM | POA: Diagnosis not present

## 2022-06-17 DIAGNOSIS — M25532 Pain in left wrist: Secondary | ICD-10-CM | POA: Diagnosis not present

## 2022-06-17 DIAGNOSIS — I509 Heart failure, unspecified: Secondary | ICD-10-CM | POA: Diagnosis not present

## 2022-06-17 DIAGNOSIS — Z7952 Long term (current) use of systemic steroids: Secondary | ICD-10-CM | POA: Diagnosis not present

## 2022-06-17 DIAGNOSIS — Z86718 Personal history of other venous thrombosis and embolism: Secondary | ICD-10-CM | POA: Diagnosis not present

## 2022-06-17 DIAGNOSIS — S199XXA Unspecified injury of neck, initial encounter: Secondary | ICD-10-CM | POA: Diagnosis not present

## 2022-06-17 DIAGNOSIS — Z87891 Personal history of nicotine dependence: Secondary | ICD-10-CM | POA: Diagnosis not present

## 2022-06-17 DIAGNOSIS — E119 Type 2 diabetes mellitus without complications: Secondary | ICD-10-CM | POA: Diagnosis not present

## 2022-06-17 DIAGNOSIS — I10 Essential (primary) hypertension: Secondary | ICD-10-CM | POA: Diagnosis not present

## 2022-06-17 DIAGNOSIS — J45909 Unspecified asthma, uncomplicated: Secondary | ICD-10-CM | POA: Insufficient documentation

## 2022-06-17 DIAGNOSIS — R079 Chest pain, unspecified: Secondary | ICD-10-CM | POA: Diagnosis not present

## 2022-06-17 DIAGNOSIS — S299XXA Unspecified injury of thorax, initial encounter: Secondary | ICD-10-CM | POA: Diagnosis not present

## 2022-06-17 DIAGNOSIS — Z041 Encounter for examination and observation following transport accident: Secondary | ICD-10-CM | POA: Diagnosis not present

## 2022-06-17 DIAGNOSIS — R42 Dizziness and giddiness: Secondary | ICD-10-CM

## 2022-06-17 LAB — COMPREHENSIVE METABOLIC PANEL
ALT: 50 U/L — ABNORMAL HIGH (ref 0–44)
AST: 49 U/L — ABNORMAL HIGH (ref 15–41)
Albumin: 3.5 g/dL (ref 3.5–5.0)
Alkaline Phosphatase: 92 U/L (ref 38–126)
Anion gap: 11 (ref 5–15)
BUN: 16 mg/dL (ref 8–23)
CO2: 19 mmol/L — ABNORMAL LOW (ref 22–32)
Calcium: 8.7 mg/dL — ABNORMAL LOW (ref 8.9–10.3)
Chloride: 111 mmol/L (ref 98–111)
Creatinine, Ser: 1.01 mg/dL — ABNORMAL HIGH (ref 0.44–1.00)
GFR, Estimated: >60 mL/min (ref >60)
Glucose, Bld: 93 mg/dL (ref 70–99)
Potassium: 3.6 mmol/L (ref 3.5–5.1)
Sodium: 141 mmol/L (ref 135–145)
Total Bilirubin: 0.6 mg/dL (ref 0.3–1.2)
Total Protein: 6.3 g/dL — ABNORMAL LOW (ref 6.5–8.1)

## 2022-06-17 LAB — CBC WITH DIFFERENTIAL/PLATELET
Abs Immature Granulocytes: 0.03 10*3/uL (ref 0.00–0.07)
Basophils Absolute: 0.1 10*3/uL (ref 0.0–0.1)
Basophils Relative: 1 %
Eosinophils Absolute: 0.1 10*3/uL (ref 0.0–0.5)
Eosinophils Relative: 2 %
HCT: 38.3 % (ref 36.0–46.0)
Hemoglobin: 11.9 g/dL — ABNORMAL LOW (ref 12.0–15.0)
Immature Granulocytes: 0 %
Lymphocytes Relative: 28 %
Lymphs Abs: 2 10*3/uL (ref 0.7–4.0)
MCH: 27.4 pg (ref 26.0–34.0)
MCHC: 31.1 g/dL (ref 30.0–36.0)
MCV: 88 fL (ref 80.0–100.0)
Monocytes Absolute: 0.5 10*3/uL (ref 0.1–1.0)
Monocytes Relative: 7 %
Neutro Abs: 4.3 10*3/uL (ref 1.7–7.7)
Neutrophils Relative %: 62 %
Platelets: 270 10*3/uL (ref 150–400)
RBC: 4.35 MIL/uL (ref 3.87–5.11)
RDW: 16.6 % — ABNORMAL HIGH (ref 11.5–15.5)
WBC: 6.9 10*3/uL (ref 4.0–10.5)
nRBC: 0 % (ref 0.0–0.2)

## 2022-06-17 LAB — I-STAT CHEM 8, ED
BUN: 22 mg/dL (ref 8–23)
Calcium, Ion: 1.08 mmol/L — ABNORMAL LOW (ref 1.15–1.40)
Chloride: 113 mmol/L — ABNORMAL HIGH (ref 98–111)
Creatinine, Ser: 1 mg/dL (ref 0.44–1.00)
Glucose, Bld: 91 mg/dL (ref 70–99)
HCT: 37 % (ref 36.0–46.0)
Hemoglobin: 12.6 g/dL (ref 12.0–15.0)
Potassium: 4.4 mmol/L (ref 3.5–5.1)
Sodium: 143 mmol/L (ref 135–145)
TCO2: 22 mmol/L (ref 22–32)

## 2022-06-17 LAB — TYPE AND SCREEN
ABO/RH(D): O NEG
Antibody Screen: NEGATIVE

## 2022-06-17 LAB — TROPONIN I (HIGH SENSITIVITY): Troponin I (High Sensitivity): 6 ng/L (ref <18)

## 2022-06-17 MED ORDER — MORPHINE SULFATE (PF) 4 MG/ML IV SOLN
4.0000 mg | Freq: Once | INTRAVENOUS | Status: AC
  Administered 2022-06-17: 4 mg via INTRAVENOUS
  Filled 2022-06-17: qty 1

## 2022-06-17 MED ORDER — ENOXAPARIN SODIUM 30 MG/0.3ML IJ SOSY
30.0000 mg | PREFILLED_SYRINGE | Freq: Two times a day (BID) | INTRAMUSCULAR | Status: DC
  Administered 2022-06-18 – 2022-06-24 (×13): 30 mg via SUBCUTANEOUS
  Filled 2022-06-17 (×13): qty 0.3

## 2022-06-17 MED ORDER — ONDANSETRON HCL 4 MG/2ML IJ SOLN
4.0000 mg | Freq: Once | INTRAMUSCULAR | Status: AC
  Administered 2022-06-17: 4 mg via INTRAVENOUS
  Filled 2022-06-17: qty 2

## 2022-06-17 MED ORDER — ONDANSETRON HCL 4 MG/2ML IJ SOLN
4.0000 mg | Freq: Four times a day (QID) | INTRAMUSCULAR | Status: DC | PRN
  Administered 2022-06-18: 4 mg via INTRAVENOUS
  Filled 2022-06-17: qty 2

## 2022-06-17 MED ORDER — MORPHINE SULFATE (PF) 2 MG/ML IV SOLN
2.0000 mg | INTRAVENOUS | Status: DC | PRN
  Administered 2022-06-18: 4 mg via INTRAVENOUS
  Administered 2022-06-21: 2 mg via INTRAVENOUS
  Filled 2022-06-17: qty 1
  Filled 2022-06-17: qty 2

## 2022-06-17 MED ORDER — ACETAMINOPHEN 325 MG PO TABS: 650.0000 mg | ORAL_TABLET | ORAL | Status: DC | PRN

## 2022-06-17 MED ORDER — ONDANSETRON 4 MG PO TBDP
4.0000 mg | ORAL_TABLET | Freq: Four times a day (QID) | ORAL | Status: DC | PRN
  Administered 2022-06-18 – 2022-06-23 (×2): 4 mg via ORAL
  Filled 2022-06-17 (×2): qty 1

## 2022-06-17 MED ORDER — IOHEXOL 350 MG/ML SOLN
75.0000 mL | Freq: Once | INTRAVENOUS | Status: AC | PRN
  Administered 2022-06-17: 75 mL via INTRAVENOUS

## 2022-06-17 MED ORDER — FENTANYL CITRATE PF 50 MCG/ML IJ SOSY
50.0000 ug | PREFILLED_SYRINGE | Freq: Once | INTRAMUSCULAR | Status: AC
  Administered 2022-06-17: 50 ug via INTRAVENOUS
  Filled 2022-06-17: qty 1

## 2022-06-17 MED ORDER — OXYCODONE HCL 5 MG PO TABS
5.0000 mg | ORAL_TABLET | ORAL | Status: DC | PRN
  Administered 2022-06-18 (×3): 5 mg via ORAL
  Filled 2022-06-17 (×3): qty 1

## 2022-06-17 MED ORDER — KETOROLAC TROMETHAMINE 15 MG/ML IJ SOLN
15.0000 mg | Freq: Once | INTRAMUSCULAR | Status: AC
  Administered 2022-06-17: 15 mg via INTRAVENOUS
  Filled 2022-06-17: qty 1

## 2022-06-17 NOTE — ED Notes (Addendum)
Patient attempted to ambulate with 2 person assist, patient sat up to bedside and dangled prior to standing. Upon standing the patient reported feeling weak and as if she was going to pass out, patient assisted back to bed. Patient unable to ambulate, Dr. Francia Greaves notified
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
## 2022-06-17 NOTE — ED Notes (Signed)
Called CT for a room
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
## 2022-06-17 NOTE — ED Notes (Signed)
Pt transported to CT at this time.
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
## 2022-06-18 DIAGNOSIS — G8911 Acute pain due to trauma: Secondary | ICD-10-CM | POA: Diagnosis not present

## 2022-06-18 DIAGNOSIS — E119 Type 2 diabetes mellitus without complications: Secondary | ICD-10-CM | POA: Diagnosis not present

## 2022-06-18 LAB — CBC
HCT: 38.9 % (ref 36.0–46.0)
Hemoglobin: 12.1 g/dL (ref 12.0–15.0)
MCH: 27.4 pg (ref 26.0–34.0)
MCHC: 31.1 g/dL (ref 30.0–36.0)
MCV: 88.2 fL (ref 80.0–100.0)
Platelets: 296 10*3/uL (ref 150–400)
RBC: 4.41 MIL/uL (ref 3.87–5.11)
RDW: 16.9 % — ABNORMAL HIGH (ref 11.5–15.5)
WBC: 6.3 10*3/uL (ref 4.0–10.5)
nRBC: 0 % (ref 0.0–0.2)

## 2022-06-18 LAB — CREATININE, SERUM
Creatinine, Ser: 1.07 mg/dL — ABNORMAL HIGH (ref 0.44–1.00)
GFR, Estimated: 57 mL/min — ABNORMAL LOW (ref >60)

## 2022-06-18 LAB — HIV ANTIBODY (ROUTINE TESTING W REFLEX): HIV Screen 4th Generation wRfx: NONREACTIVE

## 2022-06-18 LAB — TROPONIN I (HIGH SENSITIVITY): Troponin I (High Sensitivity): 6 ng/L (ref <18)

## 2022-06-18 MED ORDER — VITAMIN C 500 MG PO TABS
1000.0000 mg | ORAL_TABLET | Freq: Every day | ORAL | Status: DC
  Administered 2022-06-18 – 2022-06-24 (×7): 1000 mg via ORAL
  Filled 2022-06-18 (×8): qty 2

## 2022-06-18 MED ORDER — FERROUS SULFATE 325 (65 FE) MG PO TABS
325.0000 mg | ORAL_TABLET | ORAL | Status: DC
  Administered 2022-06-18: 325 mg via ORAL
  Filled 2022-06-18: qty 1

## 2022-06-18 MED ORDER — METHOCARBAMOL 500 MG PO TABS
500.0000 mg | ORAL_TABLET | Freq: Three times a day (TID) | ORAL | Status: DC
  Administered 2022-06-18 – 2022-06-19 (×4): 500 mg via ORAL
  Filled 2022-06-18 (×4): qty 1

## 2022-06-18 MED ORDER — FLUTICASONE PROPIONATE 50 MCG/ACT NA SUSP: 2.0000 | NASAL | Status: DC | PRN

## 2022-06-18 MED ORDER — PAROXETINE HCL 10 MG PO TABS
10.0000 mg | ORAL_TABLET | Freq: Every day | ORAL | Status: DC
  Administered 2022-06-18 – 2022-06-24 (×7): 10 mg via ORAL
  Filled 2022-06-18 (×7): qty 1

## 2022-06-18 MED ORDER — TRAMADOL HCL 50 MG PO TABS: 50.0000 mg | ORAL_TABLET | Freq: Four times a day (QID) | ORAL | Status: DC | PRN

## 2022-06-18 MED ORDER — LIDOCAINE 5 % EX PTCH
1.0000 | MEDICATED_PATCH | CUTANEOUS | Status: DC
  Administered 2022-06-18 – 2022-06-19 (×2): 1 via TRANSDERMAL
  Filled 2022-06-18 (×5): qty 1

## 2022-06-18 MED ORDER — ALBUTEROL SULFATE (2.5 MG/3ML) 0.083% IN NEBU: 2.5000 mg | INHALATION_SOLUTION | Freq: Four times a day (QID) | RESPIRATORY_TRACT | Status: DC | PRN

## 2022-06-18 MED ORDER — ADULT MULTIVITAMIN W/MINERALS CH
1.0000 | ORAL_TABLET | Freq: Every day | ORAL | Status: DC
  Administered 2022-06-18 – 2022-06-24 (×7): 1 via ORAL
  Filled 2022-06-18 (×7): qty 1

## 2022-06-18 MED ORDER — GABAPENTIN 100 MG PO CAPS
200.0000 mg | ORAL_CAPSULE | Freq: Two times a day (BID) | ORAL | Status: DC
  Administered 2022-06-18 – 2022-06-19 (×3): 200 mg via ORAL
  Filled 2022-06-18 (×3): qty 2

## 2022-06-18 MED ORDER — VITAMIN D (ERGOCALCIFEROL) 1.25 MG (50000 UNIT) PO CAPS
50000.0000 [IU] | ORAL_CAPSULE | ORAL | Status: DC
  Administered 2022-06-18: 50000 [IU] via ORAL
  Filled 2022-06-18: qty 1

## 2022-06-18 MED ORDER — BUPROPION HCL ER (XL) 300 MG PO TB24
300.0000 mg | ORAL_TABLET | Freq: Every day | ORAL | Status: DC
  Administered 2022-06-18 – 2022-06-24 (×7): 300 mg via ORAL
  Filled 2022-06-18 (×7): qty 1

## 2022-06-18 MED ORDER — ACETAMINOPHEN 325 MG PO TABS
650.0000 mg | ORAL_TABLET | Freq: Four times a day (QID) | ORAL | Status: DC
  Administered 2022-06-18 – 2022-06-20 (×9): 650 mg via ORAL
  Filled 2022-06-18 (×9): qty 2

## 2022-06-18 MED ORDER — AMLODIPINE BESYLATE 10 MG PO TABS
10.0000 mg | ORAL_TABLET | Freq: Every day | ORAL | Status: DC
  Administered 2022-06-18 – 2022-06-24 (×7): 10 mg via ORAL
  Filled 2022-06-18 (×7): qty 1

## 2022-06-18 MED ORDER — TORSEMIDE 20 MG PO TABS
20.0000 mg | ORAL_TABLET | ORAL | Status: DC
  Administered 2022-06-18 – 2022-06-23 (×3): 20 mg via ORAL
  Filled 2022-06-18 (×3): qty 1

## 2022-06-18 MED ORDER — MONTELUKAST SODIUM 10 MG PO TABS
10.0000 mg | ORAL_TABLET | Freq: Every day | ORAL | Status: DC
  Administered 2022-06-18 – 2022-06-23 (×6): 10 mg via ORAL
  Filled 2022-06-18 (×6): qty 1
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
## 2022-06-18 NOTE — ED Notes (Signed)
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    R Recommendations: See Admitting Provider Note  Report given to:   Additional Notes: Please call if you  have any questions
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
## 2022-06-19 DIAGNOSIS — G8911 Acute pain due to trauma: Secondary | ICD-10-CM | POA: Diagnosis not present

## 2022-06-19 DIAGNOSIS — E119 Type 2 diabetes mellitus without complications: Secondary | ICD-10-CM | POA: Diagnosis not present

## 2022-06-19 MED ORDER — OXYCODONE HCL 5 MG PO TABS: 5.0000 mg | ORAL_TABLET | Freq: Four times a day (QID) | ORAL | 0 refills | Status: DC | PRN

## 2022-06-19 MED ORDER — TRAMADOL HCL 50 MG PO TABS
50.0000 mg | ORAL_TABLET | Freq: Four times a day (QID) | ORAL | Status: DC
  Administered 2022-06-19 – 2022-06-24 (×19): 50 mg via ORAL
  Filled 2022-06-19 (×21): qty 1

## 2022-06-19 MED ORDER — METHOCARBAMOL 500 MG PO TABS: 500.0000 mg | ORAL_TABLET | Freq: Four times a day (QID) | ORAL | 0 refills | Status: DC | PRN

## 2022-06-19 MED ORDER — LIDOCAINE 5 % EX PTCH: 1.0000 | MEDICATED_PATCH | CUTANEOUS | 0 refills | Status: DC

## 2022-06-19 MED ORDER — GABAPENTIN 300 MG PO CAPS
300.0000 mg | ORAL_CAPSULE | Freq: Three times a day (TID) | ORAL | Status: DC
  Administered 2022-06-19 – 2022-06-22 (×9): 300 mg via ORAL
  Filled 2022-06-19 (×9): qty 1

## 2022-06-19 MED ORDER — TRAMADOL HCL 50 MG PO TABS: ORAL_TABLET | ORAL | 0 refills | Status: DC

## 2022-06-19 MED ORDER — ACETAMINOPHEN 500 MG PO TABS: 1000.0000 mg | ORAL_TABLET | Freq: Four times a day (QID) | ORAL | 0 refills | Status: DC | PRN

## 2022-06-19 MED ORDER — OXYCODONE HCL 5 MG PO TABS
5.0000 mg | ORAL_TABLET | ORAL | Status: DC | PRN
  Administered 2022-06-19 – 2022-06-24 (×7): 5 mg via ORAL
  Filled 2022-06-19 (×7): qty 1

## 2022-06-19 MED ORDER — METHOCARBAMOL 500 MG PO TABS
1000.0000 mg | ORAL_TABLET | Freq: Three times a day (TID) | ORAL | Status: DC
  Administered 2022-06-19 (×2): 1000 mg via ORAL
  Filled 2022-06-19 (×2): qty 2

## 2022-06-19 NOTE — Progress Notes (Signed)
Notified by PT that patient unfortunately did much worse mobilizing today. Hold off on discharge for now. Adjusted pain medications. Start working on possible SNF placement in case patient does not progress with improvement in pain control.   Norm Parcel, [facility] 06/19/2022, 11:10 AM Please see Amion for pager number during day hours 7:00am-4:30pm
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
## 2022-06-19 NOTE — TOC Progression Note (Signed)
Transition of Care [facility]) - Progression Note    Patient Details  Name: [name] MRN: [id number] Date of Birth: August 02, 1955  Transition of Care [facility]) CM/SW [place], LCSW Phone Number: 06/19/2022, 12:40 PM  Clinical Narrative:    CSW spoke with with pt regarding the new recommendation for SNF. Pt reported that she has previously gone before and did not have a good experience, therefore she is refusing SNF and request [facility] services.   Pt reported she has previously worked with [facility] when she had back surgery. CSW informed RN CM of pt request.   TOC will continue to follow this admission.         Expected Discharge Plan and Services         Expected Discharge Date: 06/19/22                                     Social Determinants of Health (SDOH) Interventions SDOH Screenings   Food Insecurity: No Food Insecurity (06/18/2022)  Housing: Low Risk  (06/18/2022)  Transportation Needs: No Transportation Needs (06/18/2022)  Utilities: Not At Risk (06/18/2022)  Alcohol Screen: Low Risk  (09/29/2021)  Depression (PHQ2-9): Low Risk  (02/02/2022)  Financial Resource Strain: Low Risk  (09/29/2021)  Physical Activity: Sufficiently Active (09/29/2021)  Social Connections: Unknown (09/29/2021)  Stress: No Stress Concern Present (09/29/2021)  Tobacco Use: Medium Risk (06/17/2022)    Readmission Risk Interventions     No data to display         Beckey Rutter, MSW, LCSWA, LCASA Transitions of Care  Clinical Social Worker I

## 2022-06-19 NOTE — TOC CAGE-AID Note (Signed)
Transition of Care [facility]) - CAGE-AID Screening   Patient Details  Name: [name] MRN: [id number] Date of Birth: [birth year]   Elvina Sidle, RN Trauma Response Nurse Phone Number: [phone] 06/19/2022, 6:47 PM    CAGE-AID Screening:    Have You Ever Felt You Ought to Cut Down on Your Drinking or Drug Use?: No Have People Annoyed You By Critizing Your Drinking Or Drug Use?: No Have You Felt Bad Or Guilty About Your Drinking Or Drug Use?: No Have You Ever Had a Drink or Used Drugs First Thing In The Morning to Steady Your Nerves or to Get Rid of a Hangover?: No CAGE-AID Score: 0  Substance Abuse Education Offered: No

## 2022-06-20 DIAGNOSIS — G8911 Acute pain due to trauma: Secondary | ICD-10-CM | POA: Diagnosis not present

## 2022-06-20 DIAGNOSIS — E119 Type 2 diabetes mellitus without complications: Secondary | ICD-10-CM | POA: Diagnosis not present

## 2022-06-20 MED ORDER — PSYLLIUM 95 % PO PACK: 1.0000 | PACK | Freq: Every day | ORAL | Status: DC

## 2022-06-20 MED ORDER — BACLOFEN 10 MG PO TABS
10.0000 mg | ORAL_TABLET | Freq: Three times a day (TID) | ORAL | Status: DC
  Administered 2022-06-20 (×3): 10 mg via ORAL
  Filled 2022-06-20 (×3): qty 1

## 2022-06-20 MED ORDER — ACETAMINOPHEN 500 MG PO TABS
1000.0000 mg | ORAL_TABLET | Freq: Four times a day (QID) | ORAL | Status: DC
  Administered 2022-06-20 – 2022-06-24 (×15): 1000 mg via ORAL
  Filled 2022-06-20 (×16): qty 2

## 2022-06-20 MED ORDER — PSYLLIUM 95 % PO PACK
1.0000 | PACK | Freq: Two times a day (BID) | ORAL | Status: DC
  Administered 2022-06-20 – 2022-06-22 (×5): 1 via ORAL
  Filled 2022-06-20 (×9): qty 1
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
## 2022-06-20 NOTE — Plan of Care (Signed)
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
## 2022-06-21 ENCOUNTER — Observation Stay (HOSPITAL_COMMUNITY): Payer: Medicare HMO

## 2022-06-21 DIAGNOSIS — E119 Type 2 diabetes mellitus without complications: Secondary | ICD-10-CM | POA: Diagnosis not present

## 2022-06-21 DIAGNOSIS — M48061 Spinal stenosis, lumbar region without neurogenic claudication: Secondary | ICD-10-CM | POA: Diagnosis not present

## 2022-06-21 DIAGNOSIS — G8911 Acute pain due to trauma: Secondary | ICD-10-CM | POA: Diagnosis not present

## 2022-06-21 LAB — URINALYSIS, ROUTINE W REFLEX MICROSCOPIC
Bilirubin Urine: NEGATIVE
Glucose, UA: NEGATIVE mg/dL
Hgb urine dipstick: NEGATIVE
Ketones, ur: NEGATIVE mg/dL
Nitrite: NEGATIVE
Protein, ur: NEGATIVE mg/dL
Specific Gravity, Urine: 1.012 (ref 1.005–1.030)
pH: 5 (ref 5.0–8.0)

## 2022-06-21 LAB — BASIC METABOLIC PANEL
Anion gap: 7 (ref 5–15)
BUN: 17 mg/dL (ref 8–23)
CO2: 24 mmol/L (ref 22–32)
Calcium: 8.8 mg/dL — ABNORMAL LOW (ref 8.9–10.3)
Chloride: 109 mmol/L (ref 98–111)
Creatinine, Ser: 1.06 mg/dL — ABNORMAL HIGH (ref 0.44–1.00)
GFR, Estimated: 57 mL/min — ABNORMAL LOW (ref >60)
Glucose, Bld: 93 mg/dL (ref 70–99)
Potassium: 4.4 mmol/L (ref 3.5–5.1)
Sodium: 140 mmol/L (ref 135–145)

## 2022-06-21 LAB — CBC
HCT: 34.7 % — ABNORMAL LOW (ref 36.0–46.0)
Hemoglobin: 10.8 g/dL — ABNORMAL LOW (ref 12.0–15.0)
MCH: 27.3 pg (ref 26.0–34.0)
MCHC: 31.1 g/dL (ref 30.0–36.0)
MCV: 87.8 fL (ref 80.0–100.0)
Platelets: 230 10*3/uL (ref 150–400)
RBC: 3.95 MIL/uL (ref 3.87–5.11)
RDW: 16.9 % — ABNORMAL HIGH (ref 11.5–15.5)
WBC: 6.3 10*3/uL (ref 4.0–10.5)
nRBC: 0 % (ref 0.0–0.2)

## 2022-06-21 MED ORDER — DOCUSATE SODIUM 100 MG PO CAPS
100.0000 mg | ORAL_CAPSULE | Freq: Two times a day (BID) | ORAL | Status: DC
  Administered 2022-06-21 – 2022-06-23 (×5): 100 mg via ORAL
  Filled 2022-06-21 (×5): qty 1

## 2022-06-21 MED ORDER — GADOBUTROL 1 MMOL/ML IV SOLN
10.0000 mL | Freq: Once | INTRAVENOUS | Status: AC | PRN
  Administered 2022-06-21: 10 mL via INTRAVENOUS

## 2022-06-21 MED ORDER — BACLOFEN 10 MG PO TABS
15.0000 mg | ORAL_TABLET | Freq: Three times a day (TID) | ORAL | Status: DC
  Administered 2022-06-21 (×3): 15 mg via ORAL
  Filled 2022-06-21 (×4): qty 2

## 2022-06-21 MED ORDER — DICLOFENAC SODIUM 1 % EX GEL
2.0000 g | Freq: Four times a day (QID) | CUTANEOUS | Status: DC
  Administered 2022-06-21 – 2022-06-24 (×10): 2 g via TOPICAL
  Filled 2022-06-21: qty 100
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
## 2022-06-21 NOTE — Progress Notes (Signed)
PT Cancellation Note  Patient Details Name: [name] MRN: [id number] DOB: 11-Jul-1955   Cancelled Treatment:    Reason Eval/Treat Not Completed: (P) Patient declined, no reason specified, pt politely declining mobility, attempted x2 throughout day, stating she had a tough day with MRI. Will check back as schedule allows to continue with PT POC.  Audry Riles. PTA Acute Rehabilitation Services Office: [place] 06/21/2022, 3:16 PM

## 2022-06-21 NOTE — Progress Notes (Signed)
Inpatient Rehab Admissions Coordinator:   Per therapy recommendations, patient was screened for CIR candidacy by Clemens Catholic, MS, CCC-SLP. At this time, Pt. Is observation status without acute fractures or intracranial changes.  Pt. does not appear to demonstrate medical necessity to justify in hospital rehabilitation/CIR. will not pursue a rehab consult for this Pt.   Recommend other rehab venues to be pursued.  Please contact me with any questions.  Clemens Catholic, [place], [place] Admissions Coordinator  [phone] ([place]) [phone] (office)
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
## 2022-06-22 ENCOUNTER — Observation Stay (HOSPITAL_COMMUNITY): Payer: Medicare HMO

## 2022-06-22 DIAGNOSIS — G8911 Acute pain due to trauma: Secondary | ICD-10-CM | POA: Diagnosis not present

## 2022-06-22 DIAGNOSIS — E119 Type 2 diabetes mellitus without complications: Secondary | ICD-10-CM | POA: Diagnosis not present

## 2022-06-22 DIAGNOSIS — M25532 Pain in left wrist: Secondary | ICD-10-CM | POA: Diagnosis not present

## 2022-06-22 LAB — BASIC METABOLIC PANEL
Anion gap: 4 — ABNORMAL LOW (ref 5–15)
BUN: 20 mg/dL (ref 8–23)
CO2: 27 mmol/L (ref 22–32)
Calcium: 8.8 mg/dL — ABNORMAL LOW (ref 8.9–10.3)
Chloride: 107 mmol/L (ref 98–111)
Creatinine, Ser: 1.17 mg/dL — ABNORMAL HIGH (ref 0.44–1.00)
GFR, Estimated: 51 mL/min — ABNORMAL LOW (ref >60)
Glucose, Bld: 93 mg/dL (ref 70–99)
Potassium: 3.9 mmol/L (ref 3.5–5.1)
Sodium: 138 mmol/L (ref 135–145)

## 2022-06-22 MED ORDER — BACLOFEN 10 MG PO TABS
10.0000 mg | ORAL_TABLET | Freq: Three times a day (TID) | ORAL | Status: DC
  Administered 2022-06-22 – 2022-06-24 (×6): 10 mg via ORAL
  Filled 2022-06-22 (×5): qty 1

## 2022-06-22 MED ORDER — GABAPENTIN 100 MG PO CAPS
200.0000 mg | ORAL_CAPSULE | Freq: Two times a day (BID) | ORAL | Status: DC
  Administered 2022-06-22 – 2022-06-24 (×4): 200 mg via ORAL
  Filled 2022-06-22 (×4): qty 2

## 2022-06-22 MED ORDER — CEPHALEXIN 500 MG PO CAPS
500.0000 mg | ORAL_CAPSULE | Freq: Two times a day (BID) | ORAL | Status: DC
  Administered 2022-06-22 – 2022-06-24 (×5): 500 mg via ORAL
  Filled 2022-06-22 (×5): qty 1
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
## 2022-06-22 NOTE — TOC Progression Note (Signed)
Transition of Care [facility]) - Progression Note    Patient Details  Name: [name] MRN: [id number] Date of Birth: Apr 08, 1956  [facility] CM/SW Contact  Ella Bodo, RN Phone Number: 06/22/2022, 4:16 PM  Clinical Narrative:    Met with patient to discuss dc planning; she states she had a rough day with therapies.  She now is agreeable to SNF for short term rehab.  Will present bed offers in AM if available, as currently has no offers.     Expected Discharge Plan: [place] Barriers to Discharge: Continued Medical Work up  Expected Discharge Plan and Services   Discharge Planning Services: CM Consult   Living arrangements for the past 2 months: Single Family Home Expected Discharge Date: 06/19/22                                     Social Determinants of Health (SDOH) Interventions SDOH Screenings   Food Insecurity: No Food Insecurity (06/18/2022)  Housing: Low Risk  (06/18/2022)  Transportation Needs: No Transportation Needs (06/18/2022)  Utilities: Not At Risk (06/18/2022)  Alcohol Screen: Low Risk  (09/29/2021)  Depression (PHQ2-9): Low Risk  (02/02/2022)  Financial Resource Strain: Low Risk  (09/29/2021)  Physical Activity: Sufficiently Active (09/29/2021)  Social Connections: Unknown (09/29/2021)  Stress: No Stress Concern Present (09/29/2021)  Tobacco Use: Medium Risk (06/17/2022)    Readmission Risk Interventions     No data to display         Reinaldo Raddle, RN, BSN  Trauma/Neuro ICU Case Manager [phone]

## 2022-06-22 NOTE — Progress Notes (Signed)
Mobility Specialist - Progress Note   06/22/22 1432  Mobility  Activity Ambulated with assistance in room  Level of Assistance Minimal assist, patient does 75% or more  Assistive Device Front wheel walker  Distance Ambulated (ft) 5 ft  Activity Response Tolerated poorly  Mobility Referral Yes  $Mobility charge 1 Mobility   Pt received in bed and agreeable to mobility. Session was limited d/t weakness and fatigue. Pt was returned to bed with all needs met.   Franki Monte  Mobility Specialist Please contact via Solicitor or Rehab office at [phone]

## 2022-06-23 DIAGNOSIS — E119 Type 2 diabetes mellitus without complications: Secondary | ICD-10-CM | POA: Diagnosis not present

## 2022-06-23 DIAGNOSIS — G8911 Acute pain due to trauma: Secondary | ICD-10-CM | POA: Diagnosis not present

## 2022-06-23 LAB — BASIC METABOLIC PANEL
Anion gap: 8 (ref 5–15)
BUN: 25 mg/dL — ABNORMAL HIGH (ref 8–23)
CO2: 23 mmol/L (ref 22–32)
Calcium: 8.7 mg/dL — ABNORMAL LOW (ref 8.9–10.3)
Chloride: 107 mmol/L (ref 98–111)
Creatinine, Ser: 0.98 mg/dL (ref 0.44–1.00)
GFR, Estimated: >60 mL/min (ref >60)
Glucose, Bld: 117 mg/dL — ABNORMAL HIGH (ref 70–99)
Potassium: 4.1 mmol/L (ref 3.5–5.1)
Sodium: 138 mmol/L (ref 135–145)

## 2022-06-23 MED ORDER — LOPERAMIDE HCL 2 MG PO CAPS
2.0000 mg | ORAL_CAPSULE | ORAL | Status: DC | PRN
  Administered 2022-06-23: 2 mg via ORAL
  Filled 2022-06-23 (×3): qty 1

## 2022-06-23 NOTE — TOC Progression Note (Addendum)
Transition of Care [facility]) - Progression Note    Patient Details  Name: [name] MRN: [id number] Date of Birth: [birth year]  Transition of Care [facility]) CM/SW Contact  Oren Section Cleta Alberts, RN Phone Number: 06/23/2022, 3:55 PM  Clinical Narrative:    Patient has bed offer at [facility], per [facility] in admissions at facility.   Patient accepting of bed offer and Janie made aware.  Will initiate insurance authorization and provide updates as available.   Addendum: 4:30pm Patient approved by insurance for SNF stay; approved 3/6 - 3/8 NRD 3/8 [employer] Auth ID [id number].  Bed available on 06/24/2022, per facility admissions.    Expected Discharge Plan: Skilled Nursing Facility Barriers to Discharge: SNF Pending bed offer  Expected Discharge Plan and Services   Discharge Planning Services: CM Consult   Living arrangements for the past 2 months: Single Family Home Expected Discharge Date: 06/19/22                                     Social Determinants of Health (SDOH) Interventions SDOH Screenings   Food Insecurity: No Food Insecurity (06/18/2022)  Housing: Low Risk  (06/18/2022)  Transportation Needs: No Transportation Needs (06/18/2022)  Utilities: Not At Risk (06/18/2022)  Alcohol Screen: Low Risk  (09/29/2021)  Depression (PHQ2-9): Low Risk  (02/02/2022)  Financial Resource Strain: Low Risk  (09/29/2021)  Physical Activity: Sufficiently Active (09/29/2021)  Social Connections: Unknown (09/29/2021)  Stress: No Stress Concern Present (09/29/2021)  Tobacco Use: Medium Risk (06/17/2022)    Readmission Risk Interventions     No data to display         Reinaldo Raddle, RN, BSN  Trauma/Neuro ICU Case Manager [phone]
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
## 2022-06-24 DIAGNOSIS — G8911 Acute pain due to trauma: Secondary | ICD-10-CM | POA: Diagnosis not present

## 2022-06-24 DIAGNOSIS — M6281 Muscle weakness (generalized): Secondary | ICD-10-CM | POA: Diagnosis not present

## 2022-06-24 DIAGNOSIS — R278 Other lack of coordination: Secondary | ICD-10-CM | POA: Diagnosis not present

## 2022-06-24 DIAGNOSIS — M549 Dorsalgia, unspecified: Secondary | ICD-10-CM | POA: Diagnosis not present

## 2022-06-24 DIAGNOSIS — Z86718 Personal history of other venous thrombosis and embolism: Secondary | ICD-10-CM | POA: Diagnosis not present

## 2022-06-24 DIAGNOSIS — M797 Fibromyalgia: Secondary | ICD-10-CM | POA: Diagnosis not present

## 2022-06-24 DIAGNOSIS — I10 Essential (primary) hypertension: Secondary | ICD-10-CM | POA: Diagnosis not present

## 2022-06-24 DIAGNOSIS — I110 Hypertensive heart disease with heart failure: Secondary | ICD-10-CM | POA: Diagnosis not present

## 2022-06-24 DIAGNOSIS — I509 Heart failure, unspecified: Secondary | ICD-10-CM | POA: Diagnosis not present

## 2022-06-24 DIAGNOSIS — M25552 Pain in left hip: Secondary | ICD-10-CM | POA: Diagnosis not present

## 2022-06-24 DIAGNOSIS — R293 Abnormal posture: Secondary | ICD-10-CM | POA: Diagnosis not present

## 2022-06-24 DIAGNOSIS — Z79899 Other long term (current) drug therapy: Secondary | ICD-10-CM | POA: Diagnosis not present

## 2022-06-24 DIAGNOSIS — N390 Urinary tract infection, site not specified: Secondary | ICD-10-CM | POA: Diagnosis not present

## 2022-06-24 DIAGNOSIS — G8929 Other chronic pain: Secondary | ICD-10-CM | POA: Diagnosis not present

## 2022-06-24 DIAGNOSIS — R2689 Other abnormalities of gait and mobility: Secondary | ICD-10-CM | POA: Diagnosis not present

## 2022-06-24 DIAGNOSIS — Z7401 Bed confinement status: Secondary | ICD-10-CM | POA: Diagnosis not present

## 2022-06-24 DIAGNOSIS — D649 Anemia, unspecified: Secondary | ICD-10-CM | POA: Diagnosis not present

## 2022-06-24 DIAGNOSIS — R29898 Other symptoms and signs involving the musculoskeletal system: Secondary | ICD-10-CM | POA: Diagnosis not present

## 2022-06-24 DIAGNOSIS — M5459 Other low back pain: Secondary | ICD-10-CM | POA: Diagnosis not present

## 2022-06-24 DIAGNOSIS — J45909 Unspecified asthma, uncomplicated: Secondary | ICD-10-CM | POA: Diagnosis not present

## 2022-06-24 DIAGNOSIS — R531 Weakness: Secondary | ICD-10-CM | POA: Diagnosis not present

## 2022-06-24 DIAGNOSIS — F329 Major depressive disorder, single episode, unspecified: Secondary | ICD-10-CM | POA: Diagnosis not present

## 2022-06-24 DIAGNOSIS — M545 Low back pain, unspecified: Secondary | ICD-10-CM | POA: Diagnosis not present

## 2022-06-24 DIAGNOSIS — E119 Type 2 diabetes mellitus without complications: Secondary | ICD-10-CM | POA: Diagnosis not present

## 2022-06-24 DIAGNOSIS — Z7952 Long term (current) use of systemic steroids: Secondary | ICD-10-CM | POA: Diagnosis not present

## 2022-06-24 LAB — CREATININE, SERUM
Creatinine, Ser: 1.22 mg/dL — ABNORMAL HIGH (ref 0.44–1.00)
GFR, Estimated: 48 mL/min — ABNORMAL LOW (ref >60)

## 2022-06-24 MED ORDER — LOPERAMIDE HCL 2 MG PO CAPS: 2.0000 mg | ORAL_CAPSULE | ORAL | 0 refills | Status: AC | PRN

## 2022-06-24 MED ORDER — CEPHALEXIN 500 MG PO CAPS: 500.0000 mg | ORAL_CAPSULE | Freq: Two times a day (BID) | ORAL | 0 refills | Status: AC

## 2022-06-24 MED ORDER — BACLOFEN 10 MG PO TABS: 10.0000 mg | ORAL_TABLET | Freq: Three times a day (TID) | ORAL | 0 refills | Status: DC

## 2022-06-24 MED ORDER — PSYLLIUM 95 % PO PACK: 1.0000 | PACK | Freq: Two times a day (BID) | ORAL | Status: DC

## 2022-06-24 MED ORDER — OXYCODONE HCL 5 MG PO TABS: 5.0000 mg | ORAL_TABLET | Freq: Four times a day (QID) | ORAL | 0 refills | Status: DC | PRN

## 2022-06-24 MED ORDER — GABAPENTIN 100 MG PO CAPS: 200.0000 mg | ORAL_CAPSULE | Freq: Two times a day (BID) | ORAL | Status: DC

## 2022-06-24 MED ORDER — METHOCARBAMOL 500 MG PO TABS: 500.0000 mg | ORAL_TABLET | Freq: Four times a day (QID) | ORAL | 0 refills | Status: DC | PRN

## 2022-06-24 MED ORDER — DICLOFENAC SODIUM 1 % EX GEL: 2.0000 g | Freq: Four times a day (QID) | CUTANEOUS | Status: DC

## 2022-06-24 NOTE — TOC Transition Note (Signed)
Transition of Care [facility]) - CM/SW Discharge Note   Patient Details  Name: [name] MRN: [id number] Date of Birth: 28-Dec-1955  [facility] CM/SW Contact:  Ella Bodo, RN Phone Number: 06/24/2022, 11:58 AM   Clinical Narrative:    Patient medically stable for discharge to [facility] today. Patient is aware of dc and is agreeable to plan.  PTAR notified for transport by Delora Fuel, LCSW.        Barriers to Discharge: SNF Pending bed offer     Discharge Placement     Existing PASRR number confirmed : 06/21/22          Patient chooses bed at: [facility] Patient to be transferred to facility by: [place] Name of family member notified: patient to notify sister Patient and family notified of of transfer: 06/24/22  Discharge Plan and Services Additional resources added to the After Visit Summary for     Discharge Planning Services: CM Consult                                 Social Determinants of Health (SDOH) Interventions SDOH Screenings   Food Insecurity: No Food Insecurity (06/18/2022)  Housing: Low Risk  (06/18/2022)  Transportation Needs: No Transportation Needs (06/18/2022)  Utilities: Not At Risk (06/18/2022)  Alcohol Screen: Low Risk  (09/29/2021)  Depression (PHQ2-9): Low Risk  (02/02/2022)  Financial Resource Strain: Low Risk  (09/29/2021)  Physical Activity: Sufficiently Active (09/29/2021)  Social Connections: Unknown (09/29/2021)  Stress: No Stress Concern Present (09/29/2021)  Tobacco Use: Medium Risk (06/17/2022)     Readmission Risk Interventions     No data to display         Reinaldo Raddle, RN, BSN  Trauma/Neuro ICU Case Manager [phone]

## 2022-06-24 NOTE — TOC Transition Note (Signed)
Transition of Care [facility]) - CM/SW Discharge Note   Patient Details  Name: [name] MRN: [id number] Date of Birth: 1955-09-20  Transition of Care [facility]) CM/SW Contact:  Amador Cunas, [place] Phone Number: 06/24/2022, 12:46 PM   Clinical Narrative: Pt for dc to [facility] today. Spoke to [place] in admissions who confirmed they are prepared to admit pt to room 3205. Pt aware of dc and reports agreeable. RN provided with number for report and PTAR arranged for transport per pt request. SW signing off at dc.   Wandra Feinstein, MSW, LCSW [phone] (coverage)        Final next level of care: Skilled Nursing Facility Barriers to Discharge: No Barriers Identified   Patient Goals and CMS Choice CMS Medicare.gov Compare Post Acute Care list provided to:: Patient Choice offered to / list presented to : Patient  Discharge Placement     Existing PASRR number confirmed : 06/21/22          Patient chooses bed at: [facility] Patient to be transferred to facility by: [place] Name of family member notified: Pt to update family Patient and family notified of of transfer: 06/24/22  Discharge Plan and Services Additional resources added to the After Visit Summary for     Discharge Planning Services: CM Consult Post Acute Care Choice: [place]                               Social Determinants of Health (SDOH) Interventions SDOH Screenings   Food Insecurity: No Food Insecurity (06/18/2022)  Housing: Low Risk  (06/18/2022)  Transportation Needs: No Transportation Needs (06/18/2022)  Utilities: Not At Risk (06/18/2022)  Alcohol Screen: Low Risk  (09/29/2021)  Depression (PHQ2-9): Low Risk  (02/02/2022)  Financial Resource Strain: Low Risk  (09/29/2021)  Physical Activity: Sufficiently Active (09/29/2021)  Social Connections: Unknown (09/29/2021)  Stress: No Stress Concern Present (09/29/2021)  Tobacco Use: Medium Risk (06/17/2022)      Readmission Risk Interventions     No data to display

## 2022-06-25 DIAGNOSIS — N390 Urinary tract infection, site not specified: Secondary | ICD-10-CM | POA: Diagnosis not present

## 2022-06-25 DIAGNOSIS — M549 Dorsalgia, unspecified: Secondary | ICD-10-CM | POA: Diagnosis not present

## 2022-06-25 DIAGNOSIS — J45909 Unspecified asthma, uncomplicated: Secondary | ICD-10-CM | POA: Diagnosis not present

## 2022-06-30 ENCOUNTER — Telehealth: Payer: Medicare HMO | Admitting: Internal Medicine

## 2022-07-06 ENCOUNTER — Encounter: Payer: Self-pay | Admitting: Internal Medicine

## 2022-07-06 ENCOUNTER — Telehealth (INDEPENDENT_AMBULATORY_CARE_PROVIDER_SITE_OTHER): Payer: Medicare HMO | Admitting: Internal Medicine

## 2022-07-06 DIAGNOSIS — M5416 Radiculopathy, lumbar region: Secondary | ICD-10-CM | POA: Diagnosis not present
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
## 2022-07-07 ENCOUNTER — Ambulatory Visit (INDEPENDENT_AMBULATORY_CARE_PROVIDER_SITE_OTHER): Payer: Medicare HMO | Admitting: Family Medicine

## 2022-07-07 ENCOUNTER — Encounter: Payer: Self-pay | Admitting: Family Medicine

## 2022-07-07 DIAGNOSIS — M5416 Radiculopathy, lumbar region: Secondary | ICD-10-CM

## 2022-07-07 DIAGNOSIS — M542 Cervicalgia: Secondary | ICD-10-CM

## 2022-07-07 DIAGNOSIS — R5381 Other malaise: Secondary | ICD-10-CM

## 2022-07-15 DIAGNOSIS — M1990 Unspecified osteoarthritis, unspecified site: Secondary | ICD-10-CM | POA: Diagnosis not present

## 2022-07-15 DIAGNOSIS — M542 Cervicalgia: Secondary | ICD-10-CM | POA: Diagnosis not present

## 2022-07-15 DIAGNOSIS — J4489 Other specified chronic obstructive pulmonary disease: Secondary | ICD-10-CM | POA: Diagnosis not present

## 2022-07-15 DIAGNOSIS — Z981 Arthrodesis status: Secondary | ICD-10-CM | POA: Diagnosis not present

## 2022-07-15 DIAGNOSIS — I10 Essential (primary) hypertension: Secondary | ICD-10-CM | POA: Diagnosis not present

## 2022-07-15 DIAGNOSIS — Z6832 Body mass index (BMI) 32.0-32.9, adult: Secondary | ICD-10-CM | POA: Diagnosis not present

## 2022-07-15 DIAGNOSIS — M48061 Spinal stenosis, lumbar region without neurogenic claudication: Secondary | ICD-10-CM | POA: Diagnosis not present

## 2022-07-15 DIAGNOSIS — M5416 Radiculopathy, lumbar region: Secondary | ICD-10-CM | POA: Diagnosis not present

## 2022-07-15 DIAGNOSIS — E669 Obesity, unspecified: Secondary | ICD-10-CM | POA: Diagnosis not present

## 2022-07-20 ENCOUNTER — Telehealth: Payer: Self-pay | Admitting: Internal Medicine

## 2022-07-20 DIAGNOSIS — J4489 Other specified chronic obstructive pulmonary disease: Secondary | ICD-10-CM | POA: Diagnosis not present

## 2022-07-20 DIAGNOSIS — Z981 Arthrodesis status: Secondary | ICD-10-CM | POA: Diagnosis not present

## 2022-07-20 DIAGNOSIS — M542 Cervicalgia: Secondary | ICD-10-CM | POA: Diagnosis not present

## 2022-07-20 DIAGNOSIS — Z6832 Body mass index (BMI) 32.0-32.9, adult: Secondary | ICD-10-CM | POA: Diagnosis not present

## 2022-07-20 DIAGNOSIS — E669 Obesity, unspecified: Secondary | ICD-10-CM | POA: Diagnosis not present

## 2022-07-20 DIAGNOSIS — M1990 Unspecified osteoarthritis, unspecified site: Secondary | ICD-10-CM | POA: Diagnosis not present

## 2022-07-20 DIAGNOSIS — I10 Essential (primary) hypertension: Secondary | ICD-10-CM | POA: Diagnosis not present

## 2022-07-20 DIAGNOSIS — M48061 Spinal stenosis, lumbar region without neurogenic claudication: Secondary | ICD-10-CM | POA: Diagnosis not present

## 2022-07-20 DIAGNOSIS — M5416 Radiculopathy, lumbar region: Secondary | ICD-10-CM | POA: Diagnosis not present

## 2022-07-20 NOTE — Telephone Encounter (Signed)
Fine with verbals

## 2022-07-20 NOTE — Telephone Encounter (Signed)
[name] with [facility] calls today following up on the referral for physical therapy. They have finished the physical therapy eval and would like to continue seeing the patient with a frequency of  Two times a week for four weeks.  These can be given verbally at   [facility]: [phone]  VM is secure

## 2022-07-20 NOTE — Telephone Encounter (Signed)
Called Christine no answer [facility] w/ MD response./lmb

## 2022-07-22 DIAGNOSIS — M1990 Unspecified osteoarthritis, unspecified site: Secondary | ICD-10-CM | POA: Diagnosis not present

## 2022-07-22 DIAGNOSIS — M5416 Radiculopathy, lumbar region: Secondary | ICD-10-CM | POA: Diagnosis not present

## 2022-07-22 DIAGNOSIS — M542 Cervicalgia: Secondary | ICD-10-CM | POA: Diagnosis not present

## 2022-07-22 DIAGNOSIS — Z6832 Body mass index (BMI) 32.0-32.9, adult: Secondary | ICD-10-CM | POA: Diagnosis not present

## 2022-07-22 DIAGNOSIS — J4489 Other specified chronic obstructive pulmonary disease: Secondary | ICD-10-CM | POA: Diagnosis not present

## 2022-07-22 DIAGNOSIS — Z981 Arthrodesis status: Secondary | ICD-10-CM | POA: Diagnosis not present

## 2022-07-22 DIAGNOSIS — E669 Obesity, unspecified: Secondary | ICD-10-CM | POA: Diagnosis not present

## 2022-07-22 DIAGNOSIS — I10 Essential (primary) hypertension: Secondary | ICD-10-CM | POA: Diagnosis not present

## 2022-07-22 DIAGNOSIS — M48061 Spinal stenosis, lumbar region without neurogenic claudication: Secondary | ICD-10-CM | POA: Diagnosis not present

## 2022-07-27 DIAGNOSIS — E669 Obesity, unspecified: Secondary | ICD-10-CM | POA: Diagnosis not present

## 2022-07-27 DIAGNOSIS — M5416 Radiculopathy, lumbar region: Secondary | ICD-10-CM | POA: Diagnosis not present

## 2022-07-27 DIAGNOSIS — J4489 Other specified chronic obstructive pulmonary disease: Secondary | ICD-10-CM | POA: Diagnosis not present

## 2022-07-27 DIAGNOSIS — M542 Cervicalgia: Secondary | ICD-10-CM | POA: Diagnosis not present

## 2022-07-27 DIAGNOSIS — I10 Essential (primary) hypertension: Secondary | ICD-10-CM | POA: Diagnosis not present

## 2022-07-27 DIAGNOSIS — M48061 Spinal stenosis, lumbar region without neurogenic claudication: Secondary | ICD-10-CM | POA: Diagnosis not present

## 2022-07-27 DIAGNOSIS — Z6832 Body mass index (BMI) 32.0-32.9, adult: Secondary | ICD-10-CM | POA: Diagnosis not present

## 2022-07-27 DIAGNOSIS — M1990 Unspecified osteoarthritis, unspecified site: Secondary | ICD-10-CM | POA: Diagnosis not present

## 2022-07-27 DIAGNOSIS — Z981 Arthrodesis status: Secondary | ICD-10-CM | POA: Diagnosis not present

## 2022-07-29 ENCOUNTER — Other Ambulatory Visit: Payer: Self-pay | Admitting: Internal Medicine

## 2022-07-29 DIAGNOSIS — Z981 Arthrodesis status: Secondary | ICD-10-CM | POA: Diagnosis not present

## 2022-07-29 DIAGNOSIS — M5416 Radiculopathy, lumbar region: Secondary | ICD-10-CM | POA: Diagnosis not present

## 2022-07-29 DIAGNOSIS — M542 Cervicalgia: Secondary | ICD-10-CM | POA: Diagnosis not present

## 2022-07-29 DIAGNOSIS — Z6832 Body mass index (BMI) 32.0-32.9, adult: Secondary | ICD-10-CM | POA: Diagnosis not present

## 2022-07-29 DIAGNOSIS — J4489 Other specified chronic obstructive pulmonary disease: Secondary | ICD-10-CM | POA: Diagnosis not present

## 2022-07-29 DIAGNOSIS — M48061 Spinal stenosis, lumbar region without neurogenic claudication: Secondary | ICD-10-CM | POA: Diagnosis not present

## 2022-07-29 DIAGNOSIS — M1990 Unspecified osteoarthritis, unspecified site: Secondary | ICD-10-CM | POA: Diagnosis not present

## 2022-07-29 DIAGNOSIS — I10 Essential (primary) hypertension: Secondary | ICD-10-CM | POA: Diagnosis not present

## 2022-07-29 DIAGNOSIS — E669 Obesity, unspecified: Secondary | ICD-10-CM | POA: Diagnosis not present

## 2022-07-30 ENCOUNTER — Encounter: Payer: Self-pay | Admitting: Internal Medicine

## 2022-07-30 ENCOUNTER — Other Ambulatory Visit: Payer: Self-pay

## 2022-07-30 ENCOUNTER — Telehealth (INDEPENDENT_AMBULATORY_CARE_PROVIDER_SITE_OTHER): Payer: Medicare HMO | Admitting: Internal Medicine

## 2022-07-30 DIAGNOSIS — Z8639 Personal history of other endocrine, nutritional and metabolic disease: Secondary | ICD-10-CM | POA: Diagnosis not present

## 2022-07-30 DIAGNOSIS — Z9884 Bariatric surgery status: Secondary | ICD-10-CM

## 2022-07-30 DIAGNOSIS — M5416 Radiculopathy, lumbar region: Secondary | ICD-10-CM | POA: Diagnosis not present

## 2022-07-30 MED ORDER — TORSEMIDE 20 MG PO TABS: 20.0000 mg | ORAL_TABLET | ORAL | 1 refills | Status: DC

## 2022-07-30 MED ORDER — ZEPBOUND 2.5 MG/0.5ML ~~LOC~~ SOAJ: 2.5000 mg | SUBCUTANEOUS | 0 refills | Status: DC

## 2022-07-30 MED ORDER — ZEPBOUND 5 MG/0.5ML ~~LOC~~ SOAJ: 5.0000 mg | SUBCUTANEOUS | 0 refills | Status: DC

## 2022-07-30 MED ORDER — ZEPBOUND 7.5 MG/0.5ML ~~LOC~~ SOAJ: 7.5000 mg | SUBCUTANEOUS | 0 refills | Status: DC

## 2022-07-30 MED ORDER — ZEPBOUND 10 MG/0.5ML ~~LOC~~ SOAJ: 10.0000 mg | SUBCUTANEOUS | 0 refills | Status: DC

## 2022-07-30 MED ORDER — OXYCODONE HCL 5 MG PO TABS: 5.0000 mg | ORAL_TABLET | ORAL | 0 refills | Status: AC | PRN

## 2022-07-30 NOTE — Assessment & Plan Note (Signed)
Will need CBC for recheck when next in office or when she is feeling well enough to come to office for labs.

## 2022-07-30 NOTE — Assessment & Plan Note (Signed)
Rx zepbound first 4 months as she is unable to get wegovy through pharmacy.

## 2022-07-30 NOTE — Assessment & Plan Note (Signed)
Overall improving but still struggling with pain.

## 2022-07-30 NOTE — Assessment & Plan Note (Signed)
We discussed since she does not have current diabetes mounjaro and ozempic are not options currently for her treatment.
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
## 2022-08-03 DIAGNOSIS — J4489 Other specified chronic obstructive pulmonary disease: Secondary | ICD-10-CM | POA: Diagnosis not present

## 2022-08-03 DIAGNOSIS — M5416 Radiculopathy, lumbar region: Secondary | ICD-10-CM | POA: Diagnosis not present

## 2022-08-03 DIAGNOSIS — E669 Obesity, unspecified: Secondary | ICD-10-CM | POA: Diagnosis not present

## 2022-08-03 DIAGNOSIS — M542 Cervicalgia: Secondary | ICD-10-CM | POA: Diagnosis not present

## 2022-08-03 DIAGNOSIS — M1990 Unspecified osteoarthritis, unspecified site: Secondary | ICD-10-CM | POA: Diagnosis not present

## 2022-08-03 DIAGNOSIS — Z981 Arthrodesis status: Secondary | ICD-10-CM | POA: Diagnosis not present

## 2022-08-03 DIAGNOSIS — I10 Essential (primary) hypertension: Secondary | ICD-10-CM | POA: Diagnosis not present

## 2022-08-03 DIAGNOSIS — M48061 Spinal stenosis, lumbar region without neurogenic claudication: Secondary | ICD-10-CM | POA: Diagnosis not present

## 2022-08-03 DIAGNOSIS — Z6832 Body mass index (BMI) 32.0-32.9, adult: Secondary | ICD-10-CM | POA: Diagnosis not present

## 2022-08-04 DIAGNOSIS — Z79899 Other long term (current) drug therapy: Secondary | ICD-10-CM

## 2022-08-04 DIAGNOSIS — I10 Essential (primary) hypertension: Secondary | ICD-10-CM | POA: Diagnosis not present

## 2022-08-04 DIAGNOSIS — J4489 Other specified chronic obstructive pulmonary disease: Secondary | ICD-10-CM | POA: Diagnosis not present

## 2022-08-04 DIAGNOSIS — M542 Cervicalgia: Secondary | ICD-10-CM | POA: Diagnosis not present

## 2022-08-04 DIAGNOSIS — M1990 Unspecified osteoarthritis, unspecified site: Secondary | ICD-10-CM | POA: Diagnosis not present

## 2022-08-04 DIAGNOSIS — M5416 Radiculopathy, lumbar region: Secondary | ICD-10-CM | POA: Diagnosis not present

## 2022-08-04 DIAGNOSIS — Z6832 Body mass index (BMI) 32.0-32.9, adult: Secondary | ICD-10-CM | POA: Diagnosis not present

## 2022-08-04 DIAGNOSIS — E669 Obesity, unspecified: Secondary | ICD-10-CM | POA: Diagnosis not present

## 2022-08-04 DIAGNOSIS — Z9181 History of falling: Secondary | ICD-10-CM

## 2022-08-04 DIAGNOSIS — M48061 Spinal stenosis, lumbar region without neurogenic claudication: Secondary | ICD-10-CM | POA: Diagnosis not present

## 2022-08-04 DIAGNOSIS — Z981 Arthrodesis status: Secondary | ICD-10-CM | POA: Diagnosis not present

## 2022-08-05 DIAGNOSIS — R6889 Other general symptoms and signs: Secondary | ICD-10-CM | POA: Diagnosis not present

## 2022-08-06 DIAGNOSIS — J4489 Other specified chronic obstructive pulmonary disease: Secondary | ICD-10-CM | POA: Diagnosis not present

## 2022-08-06 DIAGNOSIS — M48061 Spinal stenosis, lumbar region without neurogenic claudication: Secondary | ICD-10-CM | POA: Diagnosis not present

## 2022-08-06 DIAGNOSIS — M1990 Unspecified osteoarthritis, unspecified site: Secondary | ICD-10-CM | POA: Diagnosis not present

## 2022-08-06 DIAGNOSIS — M542 Cervicalgia: Secondary | ICD-10-CM | POA: Diagnosis not present

## 2022-08-06 DIAGNOSIS — M5416 Radiculopathy, lumbar region: Secondary | ICD-10-CM | POA: Diagnosis not present

## 2022-08-06 DIAGNOSIS — Z981 Arthrodesis status: Secondary | ICD-10-CM | POA: Diagnosis not present

## 2022-08-06 DIAGNOSIS — E669 Obesity, unspecified: Secondary | ICD-10-CM | POA: Diagnosis not present

## 2022-08-06 DIAGNOSIS — Z6832 Body mass index (BMI) 32.0-32.9, adult: Secondary | ICD-10-CM | POA: Diagnosis not present

## 2022-08-06 DIAGNOSIS — I10 Essential (primary) hypertension: Secondary | ICD-10-CM | POA: Diagnosis not present

## 2022-08-10 DIAGNOSIS — H524 Presbyopia: Secondary | ICD-10-CM | POA: Diagnosis not present

## 2022-08-11 DIAGNOSIS — M1990 Unspecified osteoarthritis, unspecified site: Secondary | ICD-10-CM | POA: Diagnosis not present

## 2022-08-11 DIAGNOSIS — I10 Essential (primary) hypertension: Secondary | ICD-10-CM | POA: Diagnosis not present

## 2022-08-11 DIAGNOSIS — Z981 Arthrodesis status: Secondary | ICD-10-CM | POA: Diagnosis not present

## 2022-08-11 DIAGNOSIS — Z6832 Body mass index (BMI) 32.0-32.9, adult: Secondary | ICD-10-CM | POA: Diagnosis not present

## 2022-08-11 DIAGNOSIS — M542 Cervicalgia: Secondary | ICD-10-CM | POA: Diagnosis not present

## 2022-08-11 DIAGNOSIS — J4489 Other specified chronic obstructive pulmonary disease: Secondary | ICD-10-CM | POA: Diagnosis not present

## 2022-08-11 DIAGNOSIS — E669 Obesity, unspecified: Secondary | ICD-10-CM | POA: Diagnosis not present

## 2022-08-11 DIAGNOSIS — M5416 Radiculopathy, lumbar region: Secondary | ICD-10-CM | POA: Diagnosis not present

## 2022-08-11 DIAGNOSIS — M48061 Spinal stenosis, lumbar region without neurogenic claudication: Secondary | ICD-10-CM | POA: Diagnosis not present

## 2022-08-12 ENCOUNTER — Encounter: Payer: Self-pay | Admitting: Family Medicine

## 2022-08-17 ENCOUNTER — Ambulatory Visit (INDEPENDENT_AMBULATORY_CARE_PROVIDER_SITE_OTHER): Payer: Medicare HMO | Admitting: Internal Medicine

## 2022-08-17 ENCOUNTER — Encounter: Payer: Self-pay | Admitting: Internal Medicine

## 2022-08-17 VITALS — BP 140/84 | HR 75 | Temp 98.3°F | Ht 65.0 in | Wt 246.0 lb

## 2022-08-17 DIAGNOSIS — M5416 Radiculopathy, lumbar region: Secondary | ICD-10-CM

## 2022-08-17 DIAGNOSIS — D649 Anemia, unspecified: Secondary | ICD-10-CM

## 2022-08-17 DIAGNOSIS — R252 Cramp and spasm: Secondary | ICD-10-CM | POA: Diagnosis not present

## 2022-08-17 DIAGNOSIS — Z8639 Personal history of other endocrine, nutritional and metabolic disease: Secondary | ICD-10-CM | POA: Diagnosis not present

## 2022-08-17 DIAGNOSIS — I10 Essential (primary) hypertension: Secondary | ICD-10-CM | POA: Diagnosis not present

## 2022-08-17 LAB — COMPREHENSIVE METABOLIC PANEL
ALT: 30 U/L (ref 0–35)
AST: 27 U/L (ref 0–37)
Albumin: 3.9 g/dL (ref 3.5–5.2)
Alkaline Phosphatase: 92 U/L (ref 39–117)
BUN: 18 mg/dL (ref 6–23)
CO2: 23 mEq/L (ref 19–32)
Calcium: 8.7 mg/dL (ref 8.4–10.5)
Chloride: 109 mEq/L (ref 96–112)
Creatinine, Ser: 0.93 mg/dL (ref 0.40–1.20)
GFR: 63 mL/min (ref >60.00)
Glucose, Bld: 82 mg/dL (ref 70–99)
Potassium: 3.9 mEq/L (ref 3.5–5.1)
Sodium: 140 mEq/L (ref 135–145)
Total Bilirubin: 0.3 mg/dL (ref 0.2–1.2)
Total Protein: 7 g/dL (ref 6.0–8.3)

## 2022-08-17 LAB — CBC
HCT: 38.5 % (ref 36.0–46.0)
Hemoglobin: 12.3 g/dL (ref 12.0–15.0)
MCHC: 31.8 g/dL (ref 30.0–36.0)
MCV: 85.2 fl (ref 78.0–100.0)
Platelets: 267 10*3/uL (ref 150.0–400.0)
RBC: 4.52 Mil/uL (ref 3.87–5.11)
RDW: 16.2 % — ABNORMAL HIGH (ref 11.5–15.5)
WBC: 7.1 10*3/uL (ref 4.0–10.5)

## 2022-08-17 MED ORDER — TRAMADOL HCL 50 MG PO TABS: 100.0000 mg | ORAL_TABLET | Freq: Two times a day (BID) | ORAL | 5 refills | Status: DC

## 2022-08-17 MED ORDER — ZEPBOUND 5 MG/0.5ML ~~LOC~~ SOAJ: 5.0000 mg | SUBCUTANEOUS | 0 refills | Status: DC

## 2022-08-17 MED ORDER — ZEPBOUND 2.5 MG/0.5ML ~~LOC~~ SOAJ: 2.5000 mg | SUBCUTANEOUS | 0 refills | Status: DC

## 2022-08-17 MED ORDER — ZEPBOUND 7.5 MG/0.5ML ~~LOC~~ SOAJ: 7.5000 mg | SUBCUTANEOUS | 0 refills | Status: DC

## 2022-08-17 MED ORDER — TRAZODONE HCL 50 MG PO TABS: 50.0000 mg | ORAL_TABLET | Freq: Every evening | ORAL | 1 refills | Status: DC | PRN

## 2022-08-17 MED ORDER — ZEPBOUND 10 MG/0.5ML ~~LOC~~ SOAJ: 10.0000 mg | SUBCUTANEOUS | 0 refills | Status: DC

## 2022-08-17 NOTE — Progress Notes (Signed)
   Subjective:   Patient ID: [name], female    DOB: 06/22/1955, 67 y.o.   MRN: [id number]  HPI The patient is a 67 YO female coming in for follow up and has not gotten zepbound yet  Review of Systems  Constitutional: Negative.   HENT: Negative.    Eyes: Negative.   Respiratory:  Negative for cough, chest tightness and shortness of breath.   Cardiovascular:  Negative for chest pain, palpitations and leg swelling.  Gastrointestinal:  Negative for abdominal distention, abdominal pain, constipation, diarrhea, nausea and vomiting.  Musculoskeletal:  Positive for arthralgias, back pain and myalgias.  Skin: Negative.   Neurological: Negative.   Psychiatric/Behavioral: Negative.      Objective:  Physical Exam Constitutional:      Appearance: She is well-developed.  HENT:     Head: Normocephalic and atraumatic.  Cardiovascular:     Rate and Rhythm: Normal rate and regular rhythm.  Pulmonary:     Effort: Pulmonary effort is normal. No respiratory distress.     Breath sounds: Normal breath sounds. No wheezing or rales.  Abdominal:     General: Bowel sounds are normal. There is no distension.     Palpations: Abdomen is soft.     Tenderness: There is no abdominal tenderness. There is no rebound.  Musculoskeletal:        General: Tenderness present.     Cervical back: Normal range of motion.  Skin:    General: Skin is warm and dry.  Neurological:     Mental Status: She is alert and oriented to person, place, and time.     Coordination: Coordination abnormal.     Vitals:   08/17/22 1510  BP: (!) 140/84  Pulse: 75  Temp: 98.3 F (36.8 C)  TempSrc: Oral  SpO2: 98%  Weight: 246 lb (111.6 kg)  Height: 5\' 5"  (1.651 m)    Assessment & Plan:

## 2022-08-17 NOTE — Patient Instructions (Addendum)
We will send back in the tramadol and sent in the zepbound again.  We have sent in the trazodone for sleep to try as well.  We will check the labs today.

## 2022-08-18 LAB — HEMOGLOBIN A1C: Hgb A1c MFr Bld: 5.3 % (ref 4.6–6.5)

## 2022-08-20 ENCOUNTER — Encounter: Payer: Self-pay | Admitting: Internal Medicine

## 2022-08-20 DIAGNOSIS — M48061 Spinal stenosis, lumbar region without neurogenic claudication: Secondary | ICD-10-CM | POA: Diagnosis not present

## 2022-08-20 DIAGNOSIS — M1990 Unspecified osteoarthritis, unspecified site: Secondary | ICD-10-CM | POA: Diagnosis not present

## 2022-08-20 DIAGNOSIS — M5416 Radiculopathy, lumbar region: Secondary | ICD-10-CM | POA: Diagnosis not present

## 2022-08-20 DIAGNOSIS — I10 Essential (primary) hypertension: Secondary | ICD-10-CM | POA: Diagnosis not present

## 2022-08-20 DIAGNOSIS — M542 Cervicalgia: Secondary | ICD-10-CM | POA: Diagnosis not present

## 2022-08-20 DIAGNOSIS — E669 Obesity, unspecified: Secondary | ICD-10-CM | POA: Diagnosis not present

## 2022-08-20 DIAGNOSIS — D649 Anemia, unspecified: Secondary | ICD-10-CM | POA: Insufficient documentation

## 2022-08-20 DIAGNOSIS — Z6832 Body mass index (BMI) 32.0-32.9, adult: Secondary | ICD-10-CM | POA: Diagnosis not present

## 2022-08-20 DIAGNOSIS — Z981 Arthrodesis status: Secondary | ICD-10-CM | POA: Diagnosis not present

## 2022-08-20 DIAGNOSIS — J4489 Other specified chronic obstructive pulmonary disease: Secondary | ICD-10-CM | POA: Diagnosis not present
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
## 2022-08-20 NOTE — Assessment & Plan Note (Signed)
BP at goal of <150/90 today. Checking CMP. Adjust amlodipine 10 mg daily and torsemide 20 mg 3 times a week as needed.
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
## 2022-08-20 NOTE — Assessment & Plan Note (Signed)
Using robaxin for pain as needed and is okay to continue this.

## 2022-08-24 DIAGNOSIS — H52223 Regular astigmatism, bilateral: Secondary | ICD-10-CM | POA: Diagnosis not present

## 2022-08-24 DIAGNOSIS — H524 Presbyopia: Secondary | ICD-10-CM | POA: Diagnosis not present

## 2022-08-26 ENCOUNTER — Telehealth: Payer: Self-pay | Admitting: Internal Medicine

## 2022-08-26 DIAGNOSIS — Z981 Arthrodesis status: Secondary | ICD-10-CM | POA: Diagnosis not present

## 2022-08-26 DIAGNOSIS — M1990 Unspecified osteoarthritis, unspecified site: Secondary | ICD-10-CM | POA: Diagnosis not present

## 2022-08-26 DIAGNOSIS — Z6832 Body mass index (BMI) 32.0-32.9, adult: Secondary | ICD-10-CM | POA: Diagnosis not present

## 2022-08-26 DIAGNOSIS — J4489 Other specified chronic obstructive pulmonary disease: Secondary | ICD-10-CM | POA: Diagnosis not present

## 2022-08-26 DIAGNOSIS — I10 Essential (primary) hypertension: Secondary | ICD-10-CM | POA: Diagnosis not present

## 2022-08-26 DIAGNOSIS — M5416 Radiculopathy, lumbar region: Secondary | ICD-10-CM | POA: Diagnosis not present

## 2022-08-26 DIAGNOSIS — M542 Cervicalgia: Secondary | ICD-10-CM | POA: Diagnosis not present

## 2022-08-26 DIAGNOSIS — M48061 Spinal stenosis, lumbar region without neurogenic claudication: Secondary | ICD-10-CM | POA: Diagnosis not present

## 2022-08-26 DIAGNOSIS — E669 Obesity, unspecified: Secondary | ICD-10-CM | POA: Diagnosis not present
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
## 2022-08-26 NOTE — Telephone Encounter (Signed)
[name] is being discharged from home health physical thereapy.  She is having muscle pain and radiating pain to the neck.  She is scheduled to go to outpatient therapy next week.  Please call:  [facility]  Phone:  [phone]

## 2022-08-31 ENCOUNTER — Ambulatory Visit: Payer: Medicare HMO | Admitting: Family Medicine

## 2022-08-31 VITALS — BP 134/88 | HR 76 | Ht 65.0 in | Wt 246.0 lb

## 2022-08-31 DIAGNOSIS — R6889 Other general symptoms and signs: Secondary | ICD-10-CM | POA: Diagnosis not present

## 2022-08-31 DIAGNOSIS — M5416 Radiculopathy, lumbar region: Secondary | ICD-10-CM | POA: Diagnosis not present

## 2022-08-31 DIAGNOSIS — M797 Fibromyalgia: Secondary | ICD-10-CM

## 2022-08-31 DIAGNOSIS — M5010 Cervical disc disorder with radiculopathy, unspecified cervical region: Secondary | ICD-10-CM | POA: Diagnosis not present

## 2022-08-31 DIAGNOSIS — M542 Cervicalgia: Secondary | ICD-10-CM

## 2022-08-31 DIAGNOSIS — R5381 Other malaise: Secondary | ICD-10-CM

## 2022-08-31 MED ORDER — KETOROLAC TROMETHAMINE 30 MG/ML IJ SOLN
30.0000 mg | Freq: Once | INTRAMUSCULAR | Status: AC
  Administered 2022-08-31: 30 mg via INTRAMUSCULAR

## 2022-08-31 MED ORDER — METHYLPREDNISOLONE ACETATE 40 MG/ML IJ SUSP
40.0000 mg | Freq: Once | INTRAMUSCULAR | Status: AC
  Administered 2022-08-31: 40 mg via INTRAMUSCULAR

## 2022-08-31 NOTE — Patient Instructions (Addendum)
[facility] [phone] Call Today  When we receive your results we will contact you.  Aquatic PT See you again in 2 months

## 2022-08-31 NOTE — Assessment & Plan Note (Addendum)
I believe the patient does have some underlying arthritic changes that is causing some nerve impingement.  Patient now still seems to be having significant amount of tightness that is more than patient's baseline.  Has responded well to aquatic therapy previously.  I do think that this will help with her significant deconditioning as well.  Get patient active again and start working on the weight loss.  Follow-up with me again in 4 to 8 weeks otherwise.  Worsening pain this time that we will give patient Toradol and Depo-Medrol injections as well.
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
## 2022-08-31 NOTE — Assessment & Plan Note (Signed)
Likely some exacerbation as well.  Patient is taking tramadol that has been prescribed by primary care provider.

## 2022-09-02 ENCOUNTER — Encounter: Payer: Self-pay | Admitting: Internal Medicine

## 2022-09-02 DIAGNOSIS — R6889 Other general symptoms and signs: Secondary | ICD-10-CM | POA: Diagnosis not present

## 2022-09-03 ENCOUNTER — Ambulatory Visit: Payer: Medicare HMO | Admitting: Family Medicine

## 2022-09-04 ENCOUNTER — Ambulatory Visit (HOSPITAL_BASED_OUTPATIENT_CLINIC_OR_DEPARTMENT_OTHER): Payer: Medicare HMO | Admitting: Physical Therapy

## 2022-09-07 ENCOUNTER — Ambulatory Visit
Admission: RE | Admit: 2022-09-07 | Discharge: 2022-09-07 | Disposition: A | Discharge status: Home / Self Care | Payer: Medicare HMO | Source: Ambulatory Visit | Attending: Family Medicine | Admitting: Family Medicine

## 2022-09-07 DIAGNOSIS — M542 Cervicalgia: Secondary | ICD-10-CM

## 2022-09-07 DIAGNOSIS — M47812 Spondylosis without myelopathy or radiculopathy, cervical region: Secondary | ICD-10-CM | POA: Diagnosis not present

## 2022-09-07 DIAGNOSIS — R200 Anesthesia of skin: Secondary | ICD-10-CM | POA: Diagnosis not present

## 2022-09-07 MED ORDER — IOPAMIDOL (ISOVUE-M 300) INJECTION 61%
1.0000 mL | Freq: Once | INTRAMUSCULAR | Status: AC
  Administered 2022-09-07: 1 mL via EPIDURAL

## 2022-09-07 MED ORDER — TRIAMCINOLONE ACETONIDE 40 MG/ML IJ SUSP (RADIOLOGY)
60.0000 mg | Freq: Once | INTRAMUSCULAR | Status: AC
  Administered 2022-09-07: 60 mg via EPIDURAL

## 2022-09-07 NOTE — Discharge Instructions (Signed)

## 2022-09-28 ENCOUNTER — Encounter: Payer: Self-pay | Admitting: Internal Medicine

## 2022-09-28 ENCOUNTER — Telehealth (INDEPENDENT_AMBULATORY_CARE_PROVIDER_SITE_OTHER): Payer: Medicare HMO | Admitting: Internal Medicine

## 2022-09-28 DIAGNOSIS — F4323 Adjustment disorder with mixed anxiety and depressed mood: Secondary | ICD-10-CM

## 2022-09-28 DIAGNOSIS — R0989 Other specified symptoms and signs involving the circulatory and respiratory systems: Secondary | ICD-10-CM | POA: Diagnosis not present

## 2022-09-28 DIAGNOSIS — F331 Major depressive disorder, recurrent, moderate: Secondary | ICD-10-CM

## 2022-09-28 MED ORDER — PAROXETINE HCL 20 MG PO TABS: 20.0000 mg | ORAL_TABLET | Freq: Every day | ORAL | 3 refills | Status: DC

## 2022-09-28 MED ORDER — PREDNISONE 20 MG PO TABS: 40.0000 mg | ORAL_TABLET | Freq: Every day | ORAL | 0 refills | Status: DC

## 2022-09-28 NOTE — Progress Notes (Unsigned)
Virtual Visit via Video Note  I connected with [name] on 09/28/22 at  3:00 PM EDT by a video enabled telemedicine application and verified that I am speaking with the correct person using two identifiers.  The patient and the provider were at separate locations throughout the entire encounter. Patient location: home, Provider location: work   I discussed the limitations of evaluation and management by telemedicine and the availability of in person appointments. The patient expressed understanding and agreed to proceed. The patient and the provider were the only parties present for the visit unless noted in HPI below.  History of Present Illness: The patient is a 67 y.o. female with visit for pain with swallowing and headaches and lightheaded 3 days or so. Getting leg cramps at night time. Getting worse and tried muscle relaxer which did not help much. Injection in the neck about 2-3 weeks ago. Helped for 1 day only. Worsening depression lately.   Observations/Objective: Appearance: normal, breathing appears worse than usual and sounds in pain, throat not well visualized, mental status is A and O times 3  Assessment and Plan: See problem oriented charting  Follow Up Instructions: Likely viral in nature, drink fluids and use tylenol for pain rx prednisone and increase paxil to 20 mg daily  I discussed the assessment and treatment plan with the patient. The patient was provided an opportunity to ask questions and all were answered. The patient agreed with the plan and demonstrated an understanding of the instructions.   The patient was advised to call back or seek an in-person evaluation if the symptoms worsen or if the condition fails to improve as anticipated.  Myrlene Broker, MD

## 2022-09-29 DIAGNOSIS — R6889 Other general symptoms and signs: Secondary | ICD-10-CM | POA: Diagnosis not present

## 2022-09-30 ENCOUNTER — Other Ambulatory Visit: Payer: Self-pay | Admitting: Cardiology

## 2022-09-30 ENCOUNTER — Other Ambulatory Visit: Payer: Self-pay | Admitting: Internal Medicine

## 2022-09-30 NOTE — Assessment & Plan Note (Signed)
Worsening and will increase to paxil 20 mg daily to help with symptoms which are moderate now and recurrent.

## 2022-09-30 NOTE — Assessment & Plan Note (Signed)
With some flare and rx prednisone 5 day course.

## 2022-09-30 NOTE — Assessment & Plan Note (Signed)
Worsening and increase paxil to 20 mg daily due to not controlled currently and moderate symptoms.

## 2022-10-05 ENCOUNTER — Ambulatory Visit (INDEPENDENT_AMBULATORY_CARE_PROVIDER_SITE_OTHER): Payer: Medicare HMO

## 2022-10-05 VITALS — Ht 65.0 in | Wt 247.0 lb

## 2022-10-05 DIAGNOSIS — Z0000 Encounter for general adult medical examination without abnormal findings: Secondary | ICD-10-CM | POA: Diagnosis not present

## 2022-10-05 DIAGNOSIS — Z1239 Encounter for other screening for malignant neoplasm of breast: Secondary | ICD-10-CM | POA: Diagnosis not present

## 2022-10-05 DIAGNOSIS — M810 Age-related osteoporosis without current pathological fracture: Secondary | ICD-10-CM

## 2022-10-05 NOTE — Patient Instructions (Addendum)
[name] , Thank you for taking time to come for your Medicare Wellness Visit. I appreciate your ongoing commitment to your health goals. Please review the following plan we discussed and let me know if I can assist you in the future.   These are the goals we discussed:  Goals      My goal for 2024 is to lose weight and start a diabetic management/counseling program.        This is a list of the screening recommended for you and due dates:  Health Maintenance  Topic Date Due   COVID-19 Vaccine (7 - 2023-24 season) 10/14/2022*   Flu Shot  11/18/2022   Mammogram  01/16/2023   Medicare Annual Wellness Visit  10/05/2023   DTaP/Tdap/Td vaccine (5 - Td or Tdap) 08/06/2024   Colon Cancer Screening  05/19/2030   Pneumonia Vaccine  Completed   DEXA scan (bone density measurement)  Completed   Hepatitis C Screening  Completed   Zoster (Shingles) Vaccine  Completed   HPV Vaccine  Aged Out  *Topic was postponed. The date shown is not the original due date.    Advanced directives: Yes; Please bring a copy of your health care power of attorney and living will to the office at your convenience.  Conditions/risks identified: Yes  Next appointment: It was nice speaking with you today!  Please follow up in one year for your annual wellness visit via telephone call with Nurse Percell Miller on 10/12/2023 at 1:30 p.m.  If you need to cancel or reschedule please call [phone].  Preventive Care 40 Years and Older, Female Preventive care refers to lifestyle choices and visits with your health care provider that can promote health and wellness. What does preventive care include? A yearly physical exam. This is also called an annual well check. Dental exams once or twice a year. Routine eye exams. Ask your health care provider how often you should have your eyes checked. Personal lifestyle choices, including: Daily care of your teeth and gums. Regular physical activity. Eating a healthy  diet. Avoiding tobacco and drug use. Limiting alcohol use. Practicing safe sex. Taking low-dose aspirin every day. Taking vitamin and mineral supplements as recommended by your health care provider. What happens during an annual well check? The services and screenings done by your health care provider during your annual well check will depend on your age, overall health, lifestyle risk factors, and family history of disease. Counseling  Your health care provider may ask you questions about your: Alcohol use. Tobacco use. Drug use. Emotional well-being. Home and relationship well-being. Sexual activity. Eating habits. History of falls. Memory and ability to understand (cognition). Work and work Astronomer. Reproductive health. Screening  You may have the following tests or measurements: Height, weight, and BMI. Blood pressure. Lipid and cholesterol levels. These may be checked every 5 years, or more frequently if you are over 38 years old. Skin check. Lung cancer screening. You may have this screening every year starting at age 16 if you have a 30-pack-year history of smoking and currently smoke or have quit within the past 15 years. Fecal occult blood test (FOBT) of the stool. You may have this test every year starting at age 70. Flexible sigmoidoscopy or colonoscopy. You may have a sigmoidoscopy every 5 years or a colonoscopy every 10 years starting at age 67. Hepatitis C blood test. Hepatitis B blood test. Sexually transmitted disease (STD) testing. Diabetes screening. This is done by checking your blood sugar (glucose) after  you have not eaten for a while (fasting). You may have this done every 1-3 years. Bone density scan. This is done to screen for osteoporosis. You may have this done starting at age 4. Mammogram. This may be done every 1-2 years. Talk to your health care provider about how often you should have regular mammograms. Talk with your health care provider about  your test results, treatment options, and if necessary, the need for more tests. Vaccines  Your health care provider may recommend certain vaccines, such as: Influenza vaccine. This is recommended every year. Tetanus, diphtheria, and acellular pertussis (Tdap, Td) vaccine. You may need a Td booster every 10 years. Zoster vaccine. You may need this after age 27. Pneumococcal 13-valent conjugate (PCV13) vaccine. One dose is recommended after age 21. Pneumococcal polysaccharide (PPSV23) vaccine. One dose is recommended after age 20. Talk to your health care provider about which screenings and vaccines you need and how often you need them. This information is not intended to replace advice given to you by your health care provider. Make sure you discuss any questions you have with your health care provider. Document Released: 05/02/2015 Document Revised: 12/24/2015 Document Reviewed: 02/04/2015 Elsevier Interactive Patient Education  2017 [employer].  Fall Prevention in the Home Falls can cause injuries. They can happen to people of all ages. There are many things you can do to make your home safe and to help prevent falls. What can I do on the outside of my home? Regularly fix the edges of walkways and driveways and fix any cracks. Remove anything that might make you trip as you walk through a door, such as a raised step or threshold. Trim any bushes or trees on the path to your home. Use bright outdoor lighting. Clear any walking paths of anything that might make someone trip, such as rocks or tools. Regularly check to see if handrails are loose or broken. Make sure that both sides of any steps have handrails. Any raised decks and porches should have guardrails on the edges. Have any leaves, snow, or ice cleared regularly. Use sand or salt on walking paths during winter. Clean up any spills in your garage right away. This includes oil or grease spills. What can I do in the bathroom? Use  night lights. Install grab bars by the toilet and in the tub and shower. Do not use towel bars as grab bars. Use non-skid mats or decals in the tub or shower. If you need to sit down in the shower, use a plastic, non-slip stool. Keep the floor dry. Clean up any water that spills on the floor as soon as it happens. Remove soap buildup in the tub or shower regularly. Attach bath mats securely with double-sided non-slip rug tape. Do not have throw rugs and other things on the floor that can make you trip. What can I do in the bedroom? Use night lights. Make sure that you have a light by your bed that is easy to reach. Do not use any sheets or blankets that are too big for your bed. They should not hang down onto the floor. Have a firm chair that has side arms. You can use this for support while you get dressed. Do not have throw rugs and other things on the floor that can make you trip. What can I do in the kitchen? Clean up any spills right away. Avoid walking on wet floors. Keep items that you use a lot in easy-to-reach places. If you  need to reach something above you, use a strong step stool that has a grab bar. Keep electrical cords out of the way. Do not use floor polish or wax that makes floors slippery. If you must use wax, use non-skid floor wax. Do not have throw rugs and other things on the floor that can make you trip. What can I do with my stairs? Do not leave any items on the stairs. Make sure that there are handrails on both sides of the stairs and use them. Fix handrails that are broken or loose. Make sure that handrails are as long as the stairways. Check any carpeting to make sure that it is firmly attached to the stairs. Fix any carpet that is loose or worn. Avoid having throw rugs at the top or bottom of the stairs. If you do have throw rugs, attach them to the floor with carpet tape. Make sure that you have a light switch at the top of the stairs and the bottom of the  stairs. If you do not have them, ask someone to add them for you. What else can I do to help prevent falls? Wear shoes that: Do not have high heels. Have rubber bottoms. Are comfortable and fit you well. Are closed at the toe. Do not wear sandals. If you use a stepladder: Make sure that it is fully opened. Do not climb a closed stepladder. Make sure that both sides of the stepladder are locked into place. Ask someone to hold it for you, if possible. Clearly mark and make sure that you can see: Any grab bars or handrails. First and last steps. Where the edge of each step is. Use tools that help you move around (mobility aids) if they are needed. These include: Canes. Walkers. Scooters. Crutches. Turn on the lights when you go into a dark area. Replace any light bulbs as soon as they burn out. Set up your furniture so you have a clear path. Avoid moving your furniture around. If any of your floors are uneven, fix them. If there are any pets around you, be aware of where they are. Review your medicines with your doctor. Some medicines can make you feel dizzy. This can increase your chance of falling. Ask your doctor what other things that you can do to help prevent falls. This information is not intended to replace advice given to you by your health care provider. Make sure you discuss any questions you have with your health care provider. Document Released: 01/30/2009 Document Revised: 09/11/2015 Document Reviewed: 05/10/2014 Elsevier Interactive Patient Education  2017 [employer].

## 2022-10-05 NOTE — Progress Notes (Signed)
Subjective:   [name] is a 67 y.o. female who presents for Medicare Annual (Subsequent) preventive examination.  Visit Complete: Virtual  I connected with  [name] on 10/05/22 by a audio enabled telemedicine application and verified that I am speaking with the correct person using two identifiers.  Patient Location: Home  Provider Location: Office/Clinic  I discussed the limitations of evaluation and management by telemedicine. The patient expressed understanding and agreed to proceed.  Patient Medicare AWV questionnaire was completed by the patient on 10/01/2022; I have confirmed that all information answered by patient is correct and no changes since this date.  Review of Systems     Cardiac Risk Factors include: advanced age (>34men, >95 women);hypertension;obesity (BMI >30kg/m2);sedentary lifestyle;family history of premature cardiovascular disease     Objective:    Today's Vitals   10/05/22 1432 10/05/22 1434  Weight: 247 lb (112 kg)   Height: 5\' 5"  (1.651 m)   PainSc: 3  3   PainLoc: Neck    Body mass index is 41.1 kg/m.     10/05/2022    2:36 PM 06/18/2022    2:54 AM 06/17/2022    4:26 PM 09/29/2021    3:37 PM 01/20/2021    3:24 PM 02/22/2020    1:54 PM 06/15/2018    4:20 PM  Advanced Directives  Does Patient Have a Medical Advance Directive? Yes  No Yes Yes No No  Type of Estate agent of [place];Living will   Living will;Healthcare Power of [employer] Power of [place];Living will    Does patient want to make changes to medical advance directive?    No - Patient declined No - Patient declined    Copy of Healthcare Power of Attorney in Chart? No - copy requested   No - copy requested Yes - validated most recent copy scanned in chart (See row information)    Would patient like information on creating a medical advance directive?  No - Patient declined    No - Patient declined No - Patient declined    Current  Medications (verified) Outpatient Encounter Medications as of 10/05/2022  Medication Sig   acetaminophen (TYLENOL) 500 MG tablet Take 2 tablets (1,000 mg total) by mouth every 6 (six) hours as needed for headache or mild pain.   albuterol (PROVENTIL) (2.5 MG/3ML) 0.083% nebulizer solution Take 3 mLs (2.5 mg total) by nebulization every 6 (six) hours as needed for wheezing or shortness of breath. (Patient taking differently: Take 2.5 mg by nebulization as needed for wheezing or shortness of breath.)   albuterol (VENTOLIN HFA) 108 (90 Base) MCG/ACT inhaler INHALE 2 PUFFS INTO THE LUNGS EVERY 6 HOURS AS NEEDED FOR WHEEZING OR SHORTNESS OF BREATH (Patient taking differently: Inhale 3 puffs into the lungs as needed for wheezing or shortness of breath.)   amLODipine (NORVASC) 10 MG tablet TAKE 1 TABLET EVERY DAY   Ascorbic Acid (VITAMIN C) 1000 MG tablet Take 1,000 mg by mouth daily.   baclofen (LIORESAL) 10 MG tablet Take 1 tablet (10 mg total) by mouth 3 (three) times daily.   buPROPion (WELLBUTRIN XL) 300 MG 24 hr tablet TAKE 1 TABLET(300 MG) BY MOUTH DAILY (Patient taking differently: Take 300 mg by mouth daily.)   diclofenac Sodium (VOLTAREN) 1 % GEL Apply 2 g topically 4 (four) times daily.   ferrous sulfate 325 (65 FE) MG tablet Take 325 mg by mouth once a week.   fluticasone (FLONASE) 50 MCG/ACT nasal spray Place 2  sprays into both nostrils as needed for allergies or rhinitis.   gabapentin (NEURONTIN) 100 MG capsule Take 2 capsules (200 mg total) by mouth 2 (two) times daily.   ipratropium (ATROVENT) 0.03 % nasal spray Place 2 sprays into both nostrils every 12 (twelve) hours. X 5-7 days (Patient taking differently: Place 2 sprays into both nostrils as needed for rhinitis.)   lipase/protease/amylase (CREON) 36000 UNITS CPEP capsule Take 36,000 Units by mouth as needed (stomach issues).   loperamide (IMODIUM) 2 MG capsule Take 1 capsule (2 mg total) by mouth as needed for diarrhea or loose stools.    methocarbamol (ROBAXIN) 500 MG tablet Take 1 tablet (500 mg total) by mouth every 6 (six) hours as needed for muscle spasms.   montelukast (SINGULAIR) 10 MG tablet Take 1 tablet (10 mg total) by mouth at bedtime.   Multiple Vitamin (MULTI-VITAMIN) tablet Take 1 tablet by mouth daily.   NEOMYCIN-POLYMYXIN-HYDROCORTISONE (CORTISPORIN) 1 % SOLN OTIC solution INSTILL 3 DROPS TO RIGHT EAR THREE TIMES DAILY   ondansetron (ZOFRAN) 4 MG tablet Take 1 tablet (4 mg total) by mouth every 8 (eight) hours as needed for nausea or vomiting. (Patient taking differently: Take 4 mg by mouth as needed for nausea or vomiting.)   PARoxetine (PAXIL) 20 MG tablet Take 1 tablet (20 mg total) by mouth daily.   predniSONE (DELTASONE) 20 MG tablet Take 2 tablets (40 mg total) by mouth daily with breakfast.   psyllium (HYDROCIL/METAMUCIL) 95 % PACK Take 1 packet by mouth 2 (two) times daily.   torsemide (DEMADEX) 20 MG tablet TAKE 2 TABLETS BY MOUTH THREE DAYS A WEEK. MAY TAKE AN ADDITIONAL 2 TABLETS AS NEEDED ON THE OTHER DAYS   traMADol (ULTRAM) 50 MG tablet Take 2 tablets (100 mg total) by mouth 2 (two) times daily.   traZODone (DESYREL) 50 MG tablet Take 1 tablet (50 mg total) by mouth at bedtime as needed for sleep.   VITAMIN D PO Take 1 capsule by mouth daily.   Vitamin D, Ergocalciferol, (DRISDOL) 1.25 MG (50000 UNIT) CAPS capsule TAKE 1 CAPSULE BY MOUTH EVERY 7 DAYS (Patient taking differently: Take 50,000 Units by mouth once a week.)   No facility-administered encounter medications on file as of 10/05/2022.    Allergies (verified) Carafate [sucralfate] and Sulfonamide derivatives   History: Past Medical History:  Diagnosis Date   Anemia    takes Ferrous Sulfate daily   Anxiety    takes Citalopram daily   Arthritis    Asthma    2004-prior to gastric bypass and no problems since   CHF (congestive heart failure) (HCC)    takes Lasix daily as needed   Chronic back pain     spondylolisthesis/stenosis/radiculopathy   Complication of anesthesia yrs ago   slow to wake up   Depression    Diabetes (HCC)    DVT (deep venous thrombosis) (HCC) 01/17/2013   past hx. -tx.5-6 yrs ago bilateral legs, occ. sporadic swelling, has IVC filter implanted   Dysrhythmia    "heart tends to flutter"   Fibromyalgia    Fracture    right foot and is in a cam boot   Gallstones    GERD (gastroesophageal reflux disease)    hx of-no meds now   Heart murmur    History of bronchitis 2012 or 2013   History of colon polyps    Hypertension    takes Metoprolol daily   Insomnia    takes Melatonin daily   Pelvic floor dysfunction  Peripheral neuropathy    Pneumonia 90's   hx of   S/P gastric bypass 2003   Sleep apnea    no cpap used in many yrs after weight lost-no machine now   Urinary frequency    Urinary urgency    Vitamin D deficiency    Past Surgical History:  Procedure Laterality Date   BALLOON DILATION N/A 01/31/2013   Procedure: BALLOON DILATION;  Surgeon: Willis Modena, MD;  Location: [facility];  Service: Endoscopy;  Laterality: N/A;   CARDIAC EVENT MONITOR  03/2019   Predominantly sinus rhythm.  Rates range from 48-124 bpm.  Average 74 bpm.  Frequent short bursts (3-15 beats) PAT/PSVT--not indicated as being symptomatic on diary.  Otherwise rare PACs and PVCs.   CARDIAC EVENT MONITOR  08/2021   14-Day Zio Patch: Predominantly sinus rhythm with rate range 45 to 135 bpm.  Average 73 bpm.  Occasional (1.3%) PACs with rare PVCs and rare PAC couplets.  24 episodes of atrial runs: Fastest was 11 beats at a rate of 184 bpm, longest was 13 beats with a rate of 92 bpm.  No sustained tachyarrhythmias or bradycardia arrhythmias.  No atrial fibrillation or flutter.   CARDIOPULMONARY EXERCISE STRESS TEST (CPX)  10/27/2021   Good effort despite dyspnea and wheezing.  Modified Naughton protocol on treadmill.  Normal heart rate response.  No sustained arrhythmias.  Preexercise  spirometry normal.  Low normal peak VO2. Low normal functional capacity with NO Clear Cardiopulmonary Limitation.  Overall limitation primarily due to obesity & deconditioning, along with a component of Diastolic Dysfunction   CHOLECYSTECTOMY  2007   COLONOSCOPY     CT CTA CORONARY W/CA SCORE W/CM &/OR WO/CM  09/21/2019   Coronary Calcium Score 0.  Normal RCA dominant coronary anatomy.  CAD RADS 1-minimal nonobstructive CAD (0-24%).  Consider nonatherosclerotic cause for chest pain.  Consider preventative therapy and risk factor modification.   DILATION AND CURETTAGE OF UTERUS  yrs ago   ESOPHAGOGASTRODUODENOSCOPY (EGD) WITH PROPOFOL  03/01/2012   Procedure: ESOPHAGOGASTRODUODENOSCOPY (EGD) WITH PROPOFOL;  Surgeon: Willis Modena, MD;  Location: [facility];  Service: Endoscopy;  Laterality: N/A;   ESOPHAGOGASTRODUODENOSCOPY (EGD) WITH PROPOFOL N/A 01/31/2013   Procedure: ESOPHAGOGASTRODUODENOSCOPY (EGD) WITH PROPOFOL;  Surgeon: Willis Modena, MD;  Location: [facility];  Service: Endoscopy;  Laterality: N/A;   ESOPHAGOGASTRODUODENOSCOPY (EGD) WITH PROPOFOL N/A 09/02/2015   Procedure: ESOPHAGOGASTRODUODENOSCOPY (EGD) WITH PROPOFOL;  Surgeon: Iva Boop, MD;  Location: [facility];  Service: Endoscopy;  Laterality: N/A;   GASTRIC BY-PASS  2004   HERNIA REPAIR  2005   INSERTION OF VENA CAVA FILTER  01/17/2013   inserted 2004- "abdomen"   LIGAMENT REPAIR Right 1987   Rt. knee scope   MAXIMUM ACCESS (MAS)POSTERIOR LUMBAR INTERBODY FUSION (PLIF) 2 LEVEL N/A 06/07/2014   Procedure: L4-5 L5-S1 FOR MAXIMUM ACCESS (MAS) POSTERIOR LUMBAR INTERBODY FUSION ;  Surgeon: Maeola Harman, MD;  Location: [facility];  Service: Neurosurgery;  Laterality: N/A;  L4-5 L5-S1 FOR MAXIMUM ACCESS (MAS) POSTERIOR LUMBAR INTERBODY FUSION    TONSILLECTOMY     as child   TRANSTHORACIC ECHOCARDIOGRAM  12/2020   EF 55 to 60%.  No RWMA.  Mild LVH.  GR 1 DD-moderate LA dilation..  Mild MR..  Normal RV size/function.   Normal PAP.  Normal RAP. => Essentially stable from December 2020   Family History  Problem Relation Age of Onset   Diabetes Mother    Atrial fibrillation Mother    Hypertension Mother  Heart disease Father        Unsure of details   Hypertension Father    Prostate cancer Father    Alzheimer's disease Father    Kidney disease Brother    Healthy Sister    Diabetes Maternal Grandmother    Heart disease Maternal Grandmother    Stroke Maternal Grandfather 50   Hypertension Maternal Grandfather    Hypertension Paternal Grandmother    Alzheimer's disease Paternal Grandmother    Colon cancer Neg Hx    Colon polyps Neg Hx    Stomach cancer Neg Hx    Esophageal cancer Neg Hx    Pancreatic cancer Neg Hx    Liver disease Neg Hx    Social History   Socioeconomic History   Marital status: Widowed    Spouse name: Not on file   Number of children: 0   Years of education: college   Highest education level: Master's degree (e.g., MA, MS, MEng, MEd, MSW, MBA)  Occupational History   Occupation: retired  Tobacco Use   Smoking status: Former    Packs/day: 0.50    Years: 10.00    Additional pack years: 0.00    Total pack years: 5.00    Types: Cigarettes    Quit date: 04/19/2009    Years since quitting: 13.4   Smokeless tobacco: Never  Substance and Sexual Activity   Alcohol use: Yes    Alcohol/week: 0.0 standard drinks of alcohol    Comment: occ. social- wine-1 drink monthly   Drug use: No   Sexual activity: Not Currently  Other Topics Concern   Not on file  Social History Narrative   Widow.  No kids.  Lives with her mother who is 66.  She does walk with a cane.  She is a retired Pharmacist, community   She quit smoking in 2003   Social Determinants of Health   Financial Resource Strain: Medium Risk (10/05/2022)   Overall Financial Resource Strain (CARDIA)    Difficulty of Paying Living Expenses: Somewhat hard  Food Insecurity: Food Insecurity Present (10/05/2022)   Hunger  Vital Sign    Worried About Running Out of Food in the Last Year: Sometimes true    Ran Out of Food in the Last Year: Sometimes true  Transportation Needs: Unmet Transportation Needs (10/05/2022)   PRAPARE - Administrator, Civil Service (Medical): Yes    Lack of Transportation (Non-Medical): Yes  Physical Activity: Inactive (10/05/2022)   Exercise Vital Sign    Days of Exercise per Week: 0 days    Minutes of Exercise per Session: 0 min  Stress: No Stress Concern Present (10/05/2022)   [employer] of Occupational Health - Occupational Stress Questionnaire    Feeling of Stress : Not at all  Social Connections: Moderately Isolated (10/05/2022)   Social Connection and Isolation Panel [NHANES]    Frequency of Communication with Friends and Family: More than three times a week    Frequency of Social Gatherings with Friends and Family: Three times a week    Attends Religious Services: Never    Active Member of Clubs or Organizations: Yes    Attends Banker Meetings: More than 4 times per year    Marital Status: Widowed    Tobacco Counseling Counseling given: Not Answered   Clinical Intake:  Pre-visit preparation completed: Yes  Pain : 0-10 Pain Score: 3  Pain Type: Chronic pain Pain Location: Neck Pain Radiating Towards: BACK     BMI -  recorded: 41.1 Nutritional Status: BMI > 30  Obese Nutritional Risks: None Diabetes: No  How often do you need to have someone help you when you read instructions, pamphlets, or other written materials from your doctor or pharmacy?: 1 - Never What is the last grade level you completed in school?: COLLEGE GRADUATE  Interpreter Needed?: No  Information entered by :: Susie Cassette, LPN.   Activities of Daily Living    10/05/2022    2:38 PM 10/01/2022   12:01 PM  In your present state of health, do you have any difficulty performing the following activities:  Hearing? 1 1  Vision? 0 0  Difficulty  concentrating or making decisions? 0 0  Walking or climbing stairs? 1 1  Dressing or bathing? 0 0  Doing errands, shopping? 0 0  Preparing Food and eating ? N N  Using the Toilet? N N  In the past six months, have you accidently leaked urine? Y Y  Do you have problems with loss of bowel control? N N  Managing your Medications? N N  Managing your Finances? N N  Housekeeping or managing your Housekeeping? N N    Patient Care Team: Myrlene Broker, MD as PCP - General (Internal Medicine) Marykay Lex, MD as PCP - Cardiology (Cardiology) Szabat, Vinnie Level, [facility] (Inactive) (Pharmacist) Duke, Roe Rutherford, PA as Physician Assistant (Cardiology)  Indicate any recent Medical Services you may have received from other than [facility] providers in the past year (date may be approximate).     Assessment:   This is a routine wellness examination for [facility].  Hearing/Vision screen Hearing Screening - Comments:: Denies hearing difficulties; no hearing aids.  Vision Screening - Comments:: Wears rx glasses - up to date with routine eye exams with [facility]   Dietary issues and exercise activities discussed:     Goals Addressed             This Visit's Progress    My goal for 2024 is to lose weight and start a diabetic management/counseling program.        Depression Screen    10/05/2022    2:37 PM 09/28/2022    2:51 PM 08/17/2022    3:13 PM 02/02/2022    2:36 PM 02/02/2022    2:30 PM 10/13/2021    2:05 PM 09/29/2021    3:41 PM  PHQ 2/9 Scores  PHQ - 2 Score 0 0 0 0 0 0 0  PHQ- 9 Score 0 0 0 3 0 0     Fall Risk    10/05/2022    2:37 PM 10/01/2022   12:01 PM 09/28/2022    2:50 PM 02/02/2022    2:36 PM 02/02/2022    2:31 PM  Fall Risk   Falls in the past year? 0 0 0 0 0  Number falls in past yr: 0 0 0 0 0  Injury with Fall? 0  0 0 0  Risk for fall due to : No Fall Risks   No Fall Risks   Follow up Falls prevention discussed  Falls evaluation completed Falls  evaluation completed Falls evaluation completed    MEDICARE RISK AT HOME:  Medicare Risk at Home - 10/05/22 1440     Any stairs in or around the home? No    If so, are there any without handrails? No    Home free of loose throw rugs in walkways, pet beds, electrical cords, etc? Yes    Adequate lighting in your home  to reduce risk of falls? Yes    Life alert? Yes   Alexa, smart watch   Use of a cane, walker or w/c? Yes    Grab bars in the bathroom? Yes    Shower chair or bench in shower? Yes    Elevated toilet seat or a handicapped toilet? Yes             TIMED UP AND GO:  Was the test performed?  No    Cognitive Function:        10/05/2022    2:40 PM 09/29/2021    3:50 PM  6CIT Screen  What Year? 0 points 0 points  What month? 0 points 0 points  What time? 0 points 0 points  Count back from 20 0 points 0 points  Months in reverse 0 points 0 points  Repeat phrase 0 points 0 points  Total Score 0 points 0 points    Immunizations Immunization History  Administered Date(s) Administered   Fluad Quad(high Dose 65+) 12/26/2020   Influenza Split 02/01/2007, 12/16/2017   Influenza Whole 02/01/2007   Influenza,inj,Quad PF,6+ Mos 12/16/2017   Influenza-Unspecified 02/19/2013, 05/20/2014, 02/01/2016, 12/17/2016, 12/21/2018, 02/19/2020, 01/06/2022   Moderna Covid-19 Vaccine Bivalent Booster 75yrs & up 01/06/2022   Moderna Sars-Covid-2 Vaccination 05/13/2019, 06/10/2019, 02/12/2020, 08/12/2020, 12/26/2020   PNEUMOCOCCAL CONJUGATE-20 03/05/2021   Pneumococcal Conjugate-13 01/02/2019   Respiratory Syncytial Virus Vaccine,Recomb Aduvanted(Arexvy) 03/08/2022   Td 11/12/1997   Td (Adult),5 Lf Tetanus Toxid, Preservative Free 11/12/1997   Tdap 11/12/1997, 08/07/2014   Zoster Recombinat (Shingrix) 10/04/2019, 03/08/2022   Zoster, Unspecified 10/11/2019    TDAP status: Up to date  Flu Vaccine status: Up to date  Pneumococcal vaccine status: Up to date  Covid-19 vaccine  status: Completed vaccines  Qualifies for Shingles Vaccine? Yes   Zostavax completed No   Shingrix Completed?: Yes  Screening Tests Health Maintenance  Topic Date Due   COVID-19 Vaccine (7 - 2023-24 season) 10/14/2022 (Originally 03/03/2022)   INFLUENZA VACCINE  11/18/2022   MAMMOGRAM  01/16/2023   Medicare Annual Wellness (AWV)  10/05/2023   DTaP/Tdap/Td (5 - Td or Tdap) 08/06/2024   Colonoscopy  05/19/2030   Pneumonia Vaccine 25+ Years old  Completed   DEXA SCAN  Completed   Hepatitis C Screening  Completed   Zoster Vaccines- Shingrix  Completed   HPV VACCINES  Aged Out    Health Maintenance  There are no preventive care reminders to display for this patient.   Colorectal cancer screening: Type of screening: Colonoscopy. Completed 05/19/2020. Repeat every 10 years  Mammogram status: Ordered 10/05/2022. Pt provided with contact info and advised to call to schedule appt.   Bone Density status: Ordered 10/05/2022. Pt provided with contact info and advised to call to schedule appt.  Lung Cancer Screening: (Low Dose CT Chest recommended if Age 44-80 years, 20 pack-year currently smoking OR have quit w/in 15years.) does not qualify.   Lung Cancer Screening Referral: no  Additional Screening:  Hepatitis C Screening: does qualify; Completed 12/11/2015  Vision Screening: Recommended annual ophthalmology exams for early detection of glaucoma and other disorders of the eye. Is the patient up to date with their annual eye exam?  Yes  Who is the provider or what is the name of the office in which the patient attends annual eye exams? [facility] If pt is not established with a provider, would they like to be referred to a provider to establish care? No .   Dental Screening: Recommended annual  dental exams for proper oral hygiene  Diabetic Foot Exam: N/A  Community Resource Referral / Chronic Care Management: CRR required this visit?  Yes   CCM required this visit?   No     Plan:     I have personally reviewed and noted the following in the patient's chart:   Medical and social history Use of alcohol, tobacco or illicit drugs  Current medications and supplements including opioid prescriptions. Patient is not currently taking opioid prescriptions. Functional ability and status Nutritional status Physical activity Advanced directives List of other physicians Hospitalizations, surgeries, and ER visits in previous 12 months Vitals Screenings to include cognitive, depression, and falls Referrals and appointments  In addition, I have reviewed and discussed with patient certain preventive protocols, quality metrics, and best practice recommendations. A written personalized care plan for preventive services as well as general preventive health recommendations were provided to patient.     Mickeal Needy, LPN   07/26/8117   After Visit Summary: (Mail) Due to this being a telephonic visit, the after visit summary with patients personalized plan was offered to patient via mail   Nurse Notes: Normal cognitive status assessed by direct observation via telephone conversation by this Nurse Health Advisor. No abnormalities found.

## 2022-10-06 ENCOUNTER — Telehealth: Payer: Self-pay

## 2022-10-06 NOTE — Telephone Encounter (Signed)
   Telephone encounter was:  Successful.  10/06/2022 Name: [name] MRN: [id number] DOB: 26-Oct-1955  [name] is a 67 y.o. year old female who is a primary care patient of Okey Dupre Austin Miles, MD . The community resource team was consulted for assistance with Food Insecurity, Financial Difficulties related to utilities, and incontinence supplies  Care guide performed the following interventions: Spoke with patinet about resources for food, utilities and adult incontinence supplies. Patient consented to [facility] referral to local food pantries. Verified email to send food pantry list, adult incontinence supplies and utility assistance.  Follow Up Plan:  No further follow up planned at this time. The patient has been provided with needed resources.  [name] [facility]  [facility] Population Health Community Resource Care Guide   [email or web address]  ?? [phone]   Website: [email or web address]  [email or web address]

## 2022-10-09 ENCOUNTER — Other Ambulatory Visit: Payer: Self-pay

## 2022-10-09 ENCOUNTER — Ambulatory Visit (HOSPITAL_BASED_OUTPATIENT_CLINIC_OR_DEPARTMENT_OTHER): Payer: Medicare HMO | Attending: Family Medicine | Admitting: Physical Therapy

## 2022-10-09 ENCOUNTER — Encounter (HOSPITAL_BASED_OUTPATIENT_CLINIC_OR_DEPARTMENT_OTHER): Payer: Self-pay | Admitting: Physical Therapy

## 2022-10-09 DIAGNOSIS — M5459 Other low back pain: Secondary | ICD-10-CM | POA: Diagnosis not present

## 2022-10-09 DIAGNOSIS — R5381 Other malaise: Secondary | ICD-10-CM | POA: Diagnosis not present

## 2022-10-09 DIAGNOSIS — M5416 Radiculopathy, lumbar region: Secondary | ICD-10-CM | POA: Diagnosis not present

## 2022-10-09 DIAGNOSIS — R2689 Other abnormalities of gait and mobility: Secondary | ICD-10-CM | POA: Diagnosis not present

## 2022-10-09 DIAGNOSIS — M542 Cervicalgia: Secondary | ICD-10-CM | POA: Insufficient documentation

## 2022-10-09 DIAGNOSIS — M6281 Muscle weakness (generalized): Secondary | ICD-10-CM | POA: Diagnosis not present

## 2022-10-09 DIAGNOSIS — R6889 Other general symptoms and signs: Secondary | ICD-10-CM | POA: Diagnosis not present
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
## 2022-10-12 ENCOUNTER — Telehealth: Payer: Self-pay | Admitting: *Deleted

## 2022-10-12 NOTE — Progress Notes (Signed)
  Care Coordination   Note   10/12/2022 Name: [name] MRN: [id number] DOB: [birth year]  [name] is a 67 y.o. year old female who sees Myrlene Broker, MD for primary care. I reached out to [name] by phone today to offer care coordination services.  Ms. Sadek was given information about Care Coordination services today including:   The Care Coordination services include support from the care team which includes your Nurse Coordinator, Clinical Social Worker, or Pharmacist.  The Care Coordination team is here to help remove barriers to the health concerns and goals most important to you. Care Coordination services are voluntary, and the patient may decline or stop services at any time by request to their care team member.   Care Coordination Consent Status: Patient agreed to services and verbal consent obtained.   Follow up plan:  Telephone appointment with care coordination team member scheduled for:  10/29/2022  Encounter Outcome:  Pt. Scheduled from referral   Burman Nieves, [facility] Care Coordination Care Guide Direct Dial: [phone]

## 2022-10-12 NOTE — Progress Notes (Signed)
  Care Coordination  Outreach Note  10/12/2022 Name: HEIKE POUNDS MRN: [id number] DOB: [birth year]   Care Coordination Outreach Attempts: An unsuccessful telephone outreach was attempted today to offer the patient information about available care coordination services.  Follow Up Plan:  Additional outreach attempts will be made to offer the patient care coordination information and services.   Encounter Outcome:  No Answer  Burman Nieves, CCMA Care Coordination Care Guide Direct Dial: [phone]

## 2022-10-19 ENCOUNTER — Ambulatory Visit (HOSPITAL_BASED_OUTPATIENT_CLINIC_OR_DEPARTMENT_OTHER): Payer: Medicare HMO | Admitting: Physical Therapy

## 2022-10-22 ENCOUNTER — Ambulatory Visit (INDEPENDENT_AMBULATORY_CARE_PROVIDER_SITE_OTHER): Payer: Medicare HMO | Admitting: Licensed Clinical Social Worker

## 2022-10-22 DIAGNOSIS — F331 Major depressive disorder, recurrent, moderate: Secondary | ICD-10-CM | POA: Diagnosis not present

## 2022-10-22 NOTE — Progress Notes (Signed)
[facility] Counselor/Therapist Progress Note  Patient ID: [name], MRN: [id number]    Date: 10/22/22  Time Spent: ***  {LBBHAMPM:26719} - *** {LBBHAMPM:26719} : *** Minutes  Treatment Type: Individual Therapy.  Reported Symptoms: ***  Mental Status Exam: Appearance:  {PSY:22683}     Behavior: {PSY:21022743}  Motor: {PSY:22302}  Speech/Language:  {PSY:22685}  Affect: {PSY:22687}  Mood: {PSY:31886}  Thought process: {PSY:31888}  Thought content:   {PSY:903-243-3718}  Sensory/Perceptual disturbances:   {PSY:205-591-0203}  Orientation: {PSY:30297}  Attention: {PSY:22877}  Concentration: {PSY:512-544-0554}  Memory: {PSY:574-381-5322}  Fund of knowledge:  {PSY:512-544-0554}  Insight:   {PSY:512-544-0554}  Judgment:  {PSY:512-544-0554}  Impulse Control: {PSY:512-544-0554}   Risk Assessment: Danger to Self:  {PSY:22692} Self-injurious Behavior: {PSY:22692} Danger to Others: {PSY:22692} Duty to Warn:{PSY:311194} Physical Aggression / Violence:{PSY:21197} Access to Firearms a concern: {PSY:21197} Gang Involvement:{PSY:21197}  Subjective:   [name] participated from {Patient Location:26[address]::"home"}, via {LBBHVIDEOORPHONE:26720}, and consented to treatment. Therapist participated from {LBBHPROVIDERLOCATION:26721}. We met online due to COVID pandemic.   ***   Interventions: {PSY:904-672-7195}  Diagnosis: No diagnosis found.   Plan: ***Patient is to use CBT, mindfulness and coping skills to help manage decrease symptoms associated with their diagnosis.   Long-term goal:   ***Reduce overall level, frequency, and intensity of the feelings of depression, anxiety and panic evidenced by       decreased irritability, negative self talk, and helpless feelings from 6 to 7 days/week to 0 to 1 days/week per client report for at least 3 consecutive months.  Short-term goal:  ***Verbally express understanding of the relationship between feelings of depression,  anxiety and their impact on thinking patterns and behaviors. Verbalize an understanding of the role that distorted thinking plays in creating fears, excessive worry, and ruminations.  [name] MSW, LCSW/DATE

## 2022-10-24 DIAGNOSIS — F331 Major depressive disorder, recurrent, moderate: Secondary | ICD-10-CM | POA: Insufficient documentation

## 2022-10-26 ENCOUNTER — Ambulatory Visit (HOSPITAL_BASED_OUTPATIENT_CLINIC_OR_DEPARTMENT_OTHER): Payer: Medicare HMO | Attending: Family Medicine | Admitting: Physical Therapy

## 2022-10-26 ENCOUNTER — Encounter (HOSPITAL_BASED_OUTPATIENT_CLINIC_OR_DEPARTMENT_OTHER): Payer: Self-pay | Admitting: Physical Therapy

## 2022-10-26 DIAGNOSIS — M5459 Other low back pain: Secondary | ICD-10-CM | POA: Insufficient documentation

## 2022-10-26 DIAGNOSIS — M542 Cervicalgia: Secondary | ICD-10-CM | POA: Insufficient documentation

## 2022-10-26 DIAGNOSIS — M6281 Muscle weakness (generalized): Secondary | ICD-10-CM | POA: Diagnosis not present

## 2022-10-26 DIAGNOSIS — R2689 Other abnormalities of gait and mobility: Secondary | ICD-10-CM | POA: Insufficient documentation

## 2022-10-26 DIAGNOSIS — R6889 Other general symptoms and signs: Secondary | ICD-10-CM | POA: Diagnosis not present
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
## 2022-10-28 ENCOUNTER — Encounter (HOSPITAL_BASED_OUTPATIENT_CLINIC_OR_DEPARTMENT_OTHER): Payer: Self-pay

## 2022-10-28 ENCOUNTER — Ambulatory Visit (HOSPITAL_BASED_OUTPATIENT_CLINIC_OR_DEPARTMENT_OTHER): Payer: Medicare HMO | Admitting: Physical Therapy

## 2022-10-28 DIAGNOSIS — R6889 Other general symptoms and signs: Secondary | ICD-10-CM | POA: Diagnosis not present

## 2022-10-29 ENCOUNTER — Ambulatory Visit: Payer: Self-pay

## 2022-10-29 NOTE — Patient Instructions (Signed)
Visit Information  Thank you for taking time to visit with me today. Please don't hesitate to contact me if I can be of assistance to you.   Following are the goals we discussed today:  Attend provider visits as scheduled.  Discuss health questions/concerns with providers as indicated Take medications as prescribed Attend Aqua therapy as scheduled Continue to eat healthy. Avoid saturated fats/transfats   Our next appointment is by telephone on 11/29/22 at 11:30 am  Please call the care guide team at [phone] if you need to cancel or reschedule your appointment.   If you are experiencing a Mental Health or Behavioral Health Crisis or need someone to talk to, please call the Suicide and Crisis Lifeline: 52  Kathyrn Sheriff, RN, MSN, BSN, CCM [facility] Care Coordinator [phone]
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
## 2022-11-01 ENCOUNTER — Ambulatory Visit: Payer: Medicare HMO | Admitting: Family Medicine

## 2022-11-01 ENCOUNTER — Ambulatory Visit: Payer: Medicare HMO | Admitting: Licensed Clinical Social Worker

## 2022-11-01 VITALS — Ht 65.0 in

## 2022-11-01 DIAGNOSIS — R6889 Other general symptoms and signs: Secondary | ICD-10-CM | POA: Diagnosis not present

## 2022-11-01 DIAGNOSIS — M5010 Cervical disc disorder with radiculopathy, unspecified cervical region: Secondary | ICD-10-CM

## 2022-11-02 ENCOUNTER — Ambulatory Visit: Payer: Medicare HMO | Admitting: Licensed Clinical Social Worker

## 2022-11-02 ENCOUNTER — Ambulatory Visit (HOSPITAL_BASED_OUTPATIENT_CLINIC_OR_DEPARTMENT_OTHER): Payer: Medicare HMO | Admitting: Physical Therapy

## 2022-11-02 ENCOUNTER — Encounter: Payer: Self-pay | Admitting: Licensed Clinical Social Worker

## 2022-11-02 ENCOUNTER — Encounter (HOSPITAL_BASED_OUTPATIENT_CLINIC_OR_DEPARTMENT_OTHER): Payer: Self-pay | Admitting: Physical Therapy

## 2022-11-02 DIAGNOSIS — F332 Major depressive disorder, recurrent severe without psychotic features: Secondary | ICD-10-CM

## 2022-11-02 DIAGNOSIS — M6281 Muscle weakness (generalized): Secondary | ICD-10-CM

## 2022-11-02 DIAGNOSIS — F331 Major depressive disorder, recurrent, moderate: Secondary | ICD-10-CM

## 2022-11-02 DIAGNOSIS — M542 Cervicalgia: Secondary | ICD-10-CM

## 2022-11-02 DIAGNOSIS — R2689 Other abnormalities of gait and mobility: Secondary | ICD-10-CM

## 2022-11-02 DIAGNOSIS — M5459 Other low back pain: Secondary | ICD-10-CM

## 2022-11-02 DIAGNOSIS — R6889 Other general symptoms and signs: Secondary | ICD-10-CM | POA: Diagnosis not present

## 2022-11-02 NOTE — Progress Notes (Signed)
[facility] Counselor/Therapist Progress Note  Patient ID: [name], MRN: [id number]    Date: 11/02/22  Time Spent: 0300  pm - 345 pm : 45 Minutes  Treatment Type: Individual Therapy.  Reported Symptoms: Symptoms of depression and excessive worry  Mental Status Exam: Appearance:  NA      Behavior: Appropriate  Motor: Normal  Speech/Language:  Negative  Affect: Appropriate  Mood: depressed  Thought process: normal  Thought content:   WNL  Sensory/Perceptual disturbances:   WNL  Orientation: oriented to person, place, time/date, situation, day of week, month of year, and year  Attention: Good  Concentration: Good  Memory: WNL  Fund of knowledge:  Good  Insight:   Good  Judgment:  Good  Impulse Control: Good   Risk Assessment: Danger to Self:  No Self-injurious Behavior: No Danger to Others: No Duty to Warn:no Physical Aggression / Violence:No  Access to Firearms a concern: No  Gang Involvement:No   Subjective:   [name] participated from home, via video, and consented to treatment. Therapist participated from office. We met online due to patient preference. Patient was unable to get her camera to work. Audio was utilized during this session without video.  [name] presented on time for her scheduled session. PT was engaged and identified that she feels overwhelmed and still is wanting to sleep most of the time due to her medication as well as her depression. PT identified feeling like a burden to her sister who is her primary form of transportation. PT agreed that she will begin to prioritize her tasks and begin to work on boxing up items in her home that she does not want to take to her new home when she moves.    Interventions: Cognitive Behavioral Therapy  Diagnosis: Major Depressive Disorder Recurrent Severe   Plan: [name] is to use CBT, mindfulness and coping skills to help manage decrease symptoms associated with their  diagnosis.   Long-term goal:   [name] will reduce overall level, frequency, and intensity of the feelings of depression, anxiety and excessive worry  evidenced by decreased irritability, negative self talk, and helpless feelings from 6 to 7 days/week to 0 to 2 days/week per client report for at least 3 consecutive months by 10/24/2023.  Short-term goal:  [name] will verbally express understanding of the relationship between feelings of depression, anxiety and their impact on thinking patterns and behaviors. Verbalize an understanding of the role that distorted thinking plays in creating fears, excessive worry, and ruminations.  [name] MSW, LCSW DATE:11/02/2022
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
## 2022-11-04 ENCOUNTER — Ambulatory Visit (HOSPITAL_BASED_OUTPATIENT_CLINIC_OR_DEPARTMENT_OTHER): Payer: Medicare HMO | Admitting: Physical Therapy

## 2022-11-04 ENCOUNTER — Encounter (HOSPITAL_BASED_OUTPATIENT_CLINIC_OR_DEPARTMENT_OTHER): Payer: Self-pay | Admitting: Physical Therapy

## 2022-11-04 DIAGNOSIS — M6281 Muscle weakness (generalized): Secondary | ICD-10-CM

## 2022-11-04 DIAGNOSIS — R2689 Other abnormalities of gait and mobility: Secondary | ICD-10-CM

## 2022-11-04 DIAGNOSIS — M5459 Other low back pain: Secondary | ICD-10-CM

## 2022-11-04 DIAGNOSIS — M542 Cervicalgia: Secondary | ICD-10-CM | POA: Diagnosis not present

## 2022-11-04 DIAGNOSIS — R6889 Other general symptoms and signs: Secondary | ICD-10-CM | POA: Diagnosis not present
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
## 2022-11-09 ENCOUNTER — Ambulatory Visit (HOSPITAL_BASED_OUTPATIENT_CLINIC_OR_DEPARTMENT_OTHER): Payer: Medicare HMO | Admitting: Physical Therapy

## 2022-11-09 ENCOUNTER — Ambulatory Visit (INDEPENDENT_AMBULATORY_CARE_PROVIDER_SITE_OTHER): Payer: Medicare HMO | Admitting: Licensed Clinical Social Worker

## 2022-11-09 DIAGNOSIS — F332 Major depressive disorder, recurrent severe without psychotic features: Secondary | ICD-10-CM | POA: Diagnosis not present

## 2022-11-09 DIAGNOSIS — F331 Major depressive disorder, recurrent, moderate: Secondary | ICD-10-CM

## 2022-11-09 NOTE — Progress Notes (Signed)
[facility] Counselor/Therapist Progress Note  Patient ID: [name], MRN: [id number]    Date: 11/09/22  Time Spent: 0200  pm - 0251 pm : 51 Minutes  Treatment Type: Individual Therapy.  Reported Symptoms: Depression and Anxiety  Mental Status Exam: Appearance:  Casual     Behavior: Appropriate  Motor: Normal  Speech/Language:  Clear and Coherent  Affect: Appropriate  Mood: normal  Thought process: normal  Thought content:   WNL  Sensory/Perceptual disturbances:   WNL  Orientation: oriented to person, place, time/date, situation, day of week, month of year, and year  Attention: Good  Concentration: Good  Memory: WNL  Fund of knowledge:  Good  Insight:   Good  Judgment:  Good  Impulse Control: Good   Risk Assessment: Danger to Self:  No Self-injurious Behavior: No Danger to Others: No Duty to Warn:no Physical Aggression / Violence:No  Access to Firearms a concern: No  Gang Involvement:No   Subjective:   [name] participated from her home with Clinician present  from her office at [facility].  Patient consented to treatment. Caregility utilized due to patient request.  [name] presented on time for her scheduled appointment. [name] acknowledged that she has improved over the past few weeks and has been trying to accomplish some task on her list. Patient identified missing her dog and shared the details about losing her dog. Patient was able to identify that this loss was very difficult and has exacerbated her symptoms.      Interventions: Cognitive Behavioral Therapy  Diagnosis: No diagnosis found.   Plan: Cynitha will  use CBT, mindfulness and coping skills to help manage decrease symptoms associated with their diagnosis.   Long-term goal:   [name] will reduce overall level, frequency, and intensity of the feelings of depression, anxiety and stress evidenced by decreased irritability, negative self talk, and  helpless feelings from 6 to 7 days/week to 0 to 2 days/week per client report for at least 3 consecutive months.  Short-term goal:  [name] will Verbally express understanding of the relationship between feelings of depression, anxiety and their impact on thinking patterns and behaviors. Verbalize an understanding of the role that distorted thinking plays in creating fears, excessive worry, and ruminations.  [name] MSW, LCSW DATE: 11/09/2022

## 2022-11-11 ENCOUNTER — Ambulatory Visit (HOSPITAL_BASED_OUTPATIENT_CLINIC_OR_DEPARTMENT_OTHER): Payer: Medicare HMO | Admitting: Physical Therapy

## 2022-11-11 ENCOUNTER — Encounter (HOSPITAL_BASED_OUTPATIENT_CLINIC_OR_DEPARTMENT_OTHER): Payer: Self-pay | Admitting: Physical Therapy

## 2022-11-11 DIAGNOSIS — M5459 Other low back pain: Secondary | ICD-10-CM | POA: Diagnosis not present

## 2022-11-11 DIAGNOSIS — M542 Cervicalgia: Secondary | ICD-10-CM | POA: Diagnosis not present

## 2022-11-11 DIAGNOSIS — M6281 Muscle weakness (generalized): Secondary | ICD-10-CM | POA: Diagnosis not present

## 2022-11-11 DIAGNOSIS — R6889 Other general symptoms and signs: Secondary | ICD-10-CM | POA: Diagnosis not present

## 2022-11-11 DIAGNOSIS — R2689 Other abnormalities of gait and mobility: Secondary | ICD-10-CM | POA: Diagnosis not present
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
## 2022-11-16 ENCOUNTER — Encounter (HOSPITAL_BASED_OUTPATIENT_CLINIC_OR_DEPARTMENT_OTHER): Payer: Self-pay | Admitting: Physical Therapy

## 2022-11-16 ENCOUNTER — Ambulatory Visit (HOSPITAL_BASED_OUTPATIENT_CLINIC_OR_DEPARTMENT_OTHER): Payer: Medicare HMO | Admitting: Physical Therapy

## 2022-11-16 DIAGNOSIS — M542 Cervicalgia: Secondary | ICD-10-CM | POA: Diagnosis not present

## 2022-11-16 DIAGNOSIS — R6889 Other general symptoms and signs: Secondary | ICD-10-CM | POA: Diagnosis not present

## 2022-11-16 DIAGNOSIS — R2689 Other abnormalities of gait and mobility: Secondary | ICD-10-CM | POA: Diagnosis not present

## 2022-11-16 DIAGNOSIS — M6281 Muscle weakness (generalized): Secondary | ICD-10-CM

## 2022-11-16 DIAGNOSIS — M5459 Other low back pain: Secondary | ICD-10-CM
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
## 2022-11-18 ENCOUNTER — Ambulatory Visit (INDEPENDENT_AMBULATORY_CARE_PROVIDER_SITE_OTHER): Payer: Medicare HMO | Admitting: Licensed Clinical Social Worker

## 2022-11-18 ENCOUNTER — Ambulatory Visit (HOSPITAL_BASED_OUTPATIENT_CLINIC_OR_DEPARTMENT_OTHER): Payer: Medicare HMO | Attending: Family Medicine | Admitting: Physical Therapy

## 2022-11-18 ENCOUNTER — Encounter (HOSPITAL_BASED_OUTPATIENT_CLINIC_OR_DEPARTMENT_OTHER): Payer: Self-pay | Admitting: Physical Therapy

## 2022-11-18 DIAGNOSIS — M5459 Other low back pain: Secondary | ICD-10-CM | POA: Diagnosis not present

## 2022-11-18 DIAGNOSIS — M542 Cervicalgia: Secondary | ICD-10-CM | POA: Diagnosis not present

## 2022-11-18 DIAGNOSIS — M6281 Muscle weakness (generalized): Secondary | ICD-10-CM | POA: Insufficient documentation

## 2022-11-18 DIAGNOSIS — F331 Major depressive disorder, recurrent, moderate: Secondary | ICD-10-CM | POA: Diagnosis not present

## 2022-11-18 DIAGNOSIS — R2689 Other abnormalities of gait and mobility: Secondary | ICD-10-CM | POA: Diagnosis not present

## 2022-11-18 DIAGNOSIS — R6889 Other general symptoms and signs: Secondary | ICD-10-CM | POA: Diagnosis not present

## 2022-11-18 NOTE — Progress Notes (Addendum)
[facility] Counselor/Therapist Progress Note  Patient ID: RIGBY GROVE, MRN: [id number]    Date: 11/18/22  Time Spent: 0107  pm - 0153 pm : 46 Minutes  Treatment Type: Individual Therapy.  Reported Symptoms: Depression, loss of interest  Mental Status Exam: Appearance:  Casual     Behavior: Appropriate  Motor: Normal  Speech/Language:  Clear and Coherent  Affect: Flat  Mood: depressed  Thought process: normal  Thought content:   WNL  Sensory/Perceptual disturbances:   WNL  Orientation: oriented to person, place, time/date, situation, day of week, month of year, and year  Attention: Good  Concentration: Good  Memory: WNL  Fund of knowledge:  Good  Insight:   Good  Judgment:  Good  Impulse Control: Good   Risk Assessment: Danger to Self:  No Self-injurious Behavior: No Danger to Others: No Duty to Warn:no Physical Aggression / Violence:No  Access to Firearms a concern: No  Gang Involvement:No   Subjective:   [name] participated from home, via video, and consented to treatment. Therapist participated from office. We met online due to patient request.   Patient was late signing on for her session. Clinician contacted patient via phone until she could log onto to [employer]. Patient had water therapy and was late being dropped off by her medical transport. Vercie was depressed and tearful as she described her lack of interest and her feeling of being a burden to those around her. Patient shared that her best friend is currently in the hospital with brain cancer and pneumonia. She shared her feelings of sadness and loneliness. Clinician encouraged patient to visit her friend and spread her joy and love. Patient identified that she planned to visit her this afternoon and spend some quality time with her.   Interventions: Cognitive Behavioral Therapy and Grief Therapy  Diagnosis: Major Depressive Disorder recurrent, Moderate   Plan:  Palmira is to use CBT, mindfulness and coping skills to help manage decrease symptoms associated with their diagnosis.   Long-term goal:   Latrease will reduce overall level, frequency, and intensity of the feelings of depression, anxiety and loneliness evidenced by decreased irritability, negative self talk, and helpless feelings from 6 to 7 days/week to 0 to 2 days/week per client report for at least 3 consecutive months. Treatment plan will be reviewed by 10/22/2023.  Short-term goal:  Lakecia will verbally express understanding of the relationship between feelings of depression, anxiety and their impact on thinking patterns and behaviors. Verbalize an understanding of the role that distorted thinking plays in creating fears, excessive worry, and ruminations.  [name] MSW, LCSW DATE: 11/18/2022
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
## 2022-11-22 ENCOUNTER — Ambulatory Visit: Payer: Medicare HMO | Admitting: Licensed Clinical Social Worker

## 2022-11-23 ENCOUNTER — Ambulatory Visit (HOSPITAL_BASED_OUTPATIENT_CLINIC_OR_DEPARTMENT_OTHER): Payer: Medicare HMO | Admitting: Physical Therapy

## 2022-11-25 ENCOUNTER — Ambulatory Visit (HOSPITAL_BASED_OUTPATIENT_CLINIC_OR_DEPARTMENT_OTHER): Payer: Medicare HMO | Admitting: Physical Therapy

## 2022-11-29 ENCOUNTER — Telehealth: Payer: Self-pay

## 2022-11-29 ENCOUNTER — Encounter: Payer: Self-pay | Admitting: Family Medicine

## 2022-11-29 NOTE — Patient Outreach (Signed)
  Care Coordination   11/29/2022 Name: [name] MRN: [id number] DOB: [birth year]   Care Coordination Outreach Attempts:  An unsuccessful telephone outreach was attempted for a scheduled appointment today.  Follow Up Plan:  Additional outreach attempts will be made to offer the patient care coordination information and services.   Encounter Outcome:  No Answer   Care Coordination Interventions:  No, not indicated    Kathyrn Sheriff, RN, MSN, BSN, CCM [facility] Care Coordinator [phone]

## 2022-11-30 ENCOUNTER — Other Ambulatory Visit: Payer: Self-pay

## 2022-11-30 ENCOUNTER — Ambulatory Visit (HOSPITAL_BASED_OUTPATIENT_CLINIC_OR_DEPARTMENT_OTHER): Payer: Medicare HMO

## 2022-11-30 ENCOUNTER — Encounter (HOSPITAL_BASED_OUTPATIENT_CLINIC_OR_DEPARTMENT_OTHER): Payer: Self-pay

## 2022-11-30 DIAGNOSIS — M542 Cervicalgia: Secondary | ICD-10-CM | POA: Diagnosis not present

## 2022-11-30 DIAGNOSIS — R2689 Other abnormalities of gait and mobility: Secondary | ICD-10-CM | POA: Diagnosis not present

## 2022-11-30 DIAGNOSIS — R6889 Other general symptoms and signs: Secondary | ICD-10-CM | POA: Diagnosis not present

## 2022-11-30 DIAGNOSIS — M5459 Other low back pain: Secondary | ICD-10-CM

## 2022-11-30 DIAGNOSIS — M6281 Muscle weakness (generalized): Secondary | ICD-10-CM | POA: Diagnosis not present

## 2022-11-30 DIAGNOSIS — R42 Dizziness and giddiness: Secondary | ICD-10-CM
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
## 2022-12-02 ENCOUNTER — Ambulatory Visit (HOSPITAL_BASED_OUTPATIENT_CLINIC_OR_DEPARTMENT_OTHER): Payer: Medicare HMO | Admitting: Physical Therapy

## 2022-12-02 ENCOUNTER — Encounter (HOSPITAL_BASED_OUTPATIENT_CLINIC_OR_DEPARTMENT_OTHER): Payer: Self-pay | Admitting: Physical Therapy

## 2022-12-02 ENCOUNTER — Encounter (INDEPENDENT_AMBULATORY_CARE_PROVIDER_SITE_OTHER): Payer: Self-pay

## 2022-12-02 DIAGNOSIS — H903 Sensorineural hearing loss, bilateral: Secondary | ICD-10-CM | POA: Diagnosis not present

## 2022-12-02 DIAGNOSIS — M542 Cervicalgia: Secondary | ICD-10-CM

## 2022-12-02 DIAGNOSIS — R6889 Other general symptoms and signs: Secondary | ICD-10-CM | POA: Diagnosis not present

## 2022-12-02 DIAGNOSIS — R2689 Other abnormalities of gait and mobility: Secondary | ICD-10-CM | POA: Diagnosis not present

## 2022-12-02 DIAGNOSIS — M5459 Other low back pain: Secondary | ICD-10-CM | POA: Diagnosis not present

## 2022-12-02 DIAGNOSIS — M6281 Muscle weakness (generalized): Secondary | ICD-10-CM
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
## 2022-12-07 ENCOUNTER — Encounter (HOSPITAL_BASED_OUTPATIENT_CLINIC_OR_DEPARTMENT_OTHER): Payer: Self-pay | Admitting: Physical Therapy

## 2022-12-07 ENCOUNTER — Encounter (HOSPITAL_BASED_OUTPATIENT_CLINIC_OR_DEPARTMENT_OTHER): Payer: Self-pay

## 2022-12-07 ENCOUNTER — Ambulatory Visit (HOSPITAL_BASED_OUTPATIENT_CLINIC_OR_DEPARTMENT_OTHER): Payer: Medicare HMO

## 2022-12-08 ENCOUNTER — Ambulatory Visit: Payer: Medicare HMO | Admitting: Licensed Clinical Social Worker

## 2022-12-08 ENCOUNTER — Ambulatory Visit (INDEPENDENT_AMBULATORY_CARE_PROVIDER_SITE_OTHER): Payer: Medicare HMO | Admitting: Licensed Clinical Social Worker

## 2022-12-08 DIAGNOSIS — F331 Major depressive disorder, recurrent, moderate: Secondary | ICD-10-CM | POA: Diagnosis not present

## 2022-12-08 NOTE — Progress Notes (Signed)
[facility] Counselor/Therapist Progress Note  Patient ID: [name], MRN: [id number]    Date: 12/08/22  Time Spent: 200  pm - 0245 pm : 45 Minutes  Treatment Type: Individual Therapy.  Reported Symptoms: Symptoms of depression increased by medical issues.  Mental Status Exam: Appearance:  Neat     Behavior: Appropriate  Motor: Normal  Speech/Language:  Clear and Coherent  Affect: Flat  Mood: depressed  Thought process: normal  Thought content:   WNL  Sensory/Perceptual disturbances:   WNL  Orientation: oriented to person, place, time/date, situation, day of week, month of year, and year  Attention: Good  Concentration: Good  Memory: WNL  Fund of knowledge:  Good  Insight:   Good  Judgment:  Good  Impulse Control: Good   Risk Assessment: Danger to Self:  No Self-injurious Behavior: No Danger to Others: No Duty to Warn:no Physical Aggression / Violence:No  Access to Firearms a concern: No  Gang Involvement:No   Subjective:   [name] participated from home, via video, and consented to treatment. Therapist participated from home office. We met online due to Clinician having COVID.   Patient presented on time for her scheduled session. Patient was depressed and identified that she is distraught due to being told that her physical therapy has ended due to miscommunication about payment. Patient was overwhelmed and identified a multitude of emotions due to knowing she needs to complete her therapy for her health. Clinician encouraged patient to call her insurance to verify that there is not a problem and then see what her doctor suggests.    Interventions: Cognitive Behavioral Therapy  Diagnosis: Major Depressive Disorder, recurrent, moderate   Plan: [name] is to use CBT, mindfulness and coping skills to help manage decrease symptoms associated with their diagnosis.   Long-term goal:   [name] will reduce overall level,  frequency, and intensity of the feelings of depression and anxiety evidenced by decreased irritability, negative self talk, and helpless feelings from 6 to 7 days/week to 0 to 2 days/week per client report for at least 3 consecutive months. Treatment plan to be reviewed by 10/22/2023.  Short-term goal:  [name] will verbally express understanding of the relationship between feelings of depression, anxiety and their impact on thinking patterns and behaviors. Verbalize an understanding of the role that distorted thinking plays in creating fears, excessive worry, and ruminations.  [name] MSW, LCSW DATE:12/08/2022

## 2022-12-09 ENCOUNTER — Ambulatory Visit (HOSPITAL_BASED_OUTPATIENT_CLINIC_OR_DEPARTMENT_OTHER): Payer: Medicare HMO | Admitting: Physical Therapy

## 2022-12-14 ENCOUNTER — Ambulatory Visit (HOSPITAL_BASED_OUTPATIENT_CLINIC_OR_DEPARTMENT_OTHER): Payer: Medicare HMO

## 2022-12-16 ENCOUNTER — Ambulatory Visit (HOSPITAL_BASED_OUTPATIENT_CLINIC_OR_DEPARTMENT_OTHER): Payer: Medicare HMO | Admitting: Physical Therapy

## 2022-12-17 ENCOUNTER — Ambulatory Visit (INDEPENDENT_AMBULATORY_CARE_PROVIDER_SITE_OTHER): Payer: Medicare HMO | Admitting: Licensed Clinical Social Worker

## 2022-12-17 DIAGNOSIS — F331 Major depressive disorder, recurrent, moderate: Secondary | ICD-10-CM | POA: Diagnosis not present

## 2022-12-17 NOTE — Progress Notes (Signed)
[facility] Counselor/Therapist Progress Note  Patient ID: [name], MRN: [id number]    Date: 12/17/22  Time Spent: 1100  am - 1147am : 47 Minutes  Treatment Type: Individual Therapy.  Reported Symptoms: Increased Depression and excessive worry  Mental Status Exam: Appearance:  Casual     Behavior: Appropriate  Motor: Normal  Speech/Language:  Clear and Coherent  Affect: Flat  Mood: depressed  Thought process: normal  Thought content:   WNL  Sensory/Perceptual disturbances:   WNL  Orientation: oriented to person, place, time/date, situation, day of week, month of year, and year  Attention: Good  Concentration: Good  Memory: WNL  Fund of knowledge:  Good  Insight:   Good  Judgment:  Good  Impulse Control: Good   Risk Assessment: Danger to Self:  No Self-injurious Behavior: No Danger to Others: No Duty to Warn:no Physical Aggression / Violence:No  Access to Firearms a concern: No  Gang Involvement:No   Subjective:   [name] participated from home, via video, and consented to treatment. Therapist participated from home office. We met online due to patient request.  [name] was engaged in discussion and identified feeling overwhelmed due to needing to do so many things. Patient reports that  she was so upset about not being able to continue her physical therapy and felt defeated. Patient and Clinician discussed the importance of doing something constructive daily. Clinician and Patient also discussed a plan to assist patient in her finding a town home as she is selling her home [place] [employer]. Patient plans to contact a realtor and the buyer of her home so that she can have answers and assistance in the process. Patient also agreed to visit her doctor to get a referral to another Physical Therapy Clinic.  Interventions: Cognitive Behavioral Therapy and Motivational Interviewing  Diagnosis: Major Depressive Disorder, recurrent,  moderate   Plan: Anayelli  is to use CBT, mindfulness and coping skills to help manage decrease symptoms associated with their diagnosis.   Long-term goal:   [name] reduce overall level, frequency, and intensity of the feelings of depression and anxiety evidenced by decreased irritability, negative self talk, and helpless feelings from 6 to 7 days/week to 0 to 2 days/week per client report for at least 3 consecutive months. Treatment plan to be reviewed by 10/22/2023.  Short-term goal:  [name] will verbally express understanding of the relationship between feelings of depression, anxiety and their impact on thinking patterns and behaviors. Verbalize an understanding of the role that distorted thinking plays in creating fears, excessive worry, and ruminations.  [name] MSW, LCSW DATE: 12/17/2022

## 2022-12-22 ENCOUNTER — Other Ambulatory Visit: Payer: Self-pay | Admitting: Internal Medicine

## 2022-12-27 ENCOUNTER — Ambulatory Visit (INDEPENDENT_AMBULATORY_CARE_PROVIDER_SITE_OTHER): Payer: Medicare HMO | Admitting: Internal Medicine

## 2022-12-27 ENCOUNTER — Encounter: Payer: Self-pay | Admitting: Internal Medicine

## 2022-12-27 VITALS — BP 137/82 | HR 54 | Temp 98.3°F | Ht 65.0 in | Wt 249.0 lb

## 2022-12-27 DIAGNOSIS — I10 Essential (primary) hypertension: Secondary | ICD-10-CM

## 2022-12-27 DIAGNOSIS — A498 Other bacterial infections of unspecified site: Secondary | ICD-10-CM

## 2022-12-27 DIAGNOSIS — H1013 Acute atopic conjunctivitis, bilateral: Secondary | ICD-10-CM

## 2022-12-27 DIAGNOSIS — Z0000 Encounter for general adult medical examination without abnormal findings: Secondary | ICD-10-CM | POA: Diagnosis not present

## 2022-12-27 DIAGNOSIS — F331 Major depressive disorder, recurrent, moderate: Secondary | ICD-10-CM | POA: Diagnosis not present

## 2022-12-27 DIAGNOSIS — Z8639 Personal history of other endocrine, nutritional and metabolic disease: Secondary | ICD-10-CM | POA: Diagnosis not present

## 2022-12-27 LAB — LIPID PANEL
Cholesterol: 148 mg/dL (ref 0–200)
HDL: 68.7 mg/dL (ref >39.00)
LDL Cholesterol: 65 mg/dL (ref 0–99)
NonHDL: 78.84
Total CHOL/HDL Ratio: 2
Triglycerides: 70 mg/dL (ref 0.0–149.0)
VLDL: 14 mg/dL (ref 0.0–40.0)

## 2022-12-27 MED ORDER — AMLODIPINE BESYLATE 10 MG PO TABS: 10.0000 mg | ORAL_TABLET | Freq: Every day | ORAL | 3 refills | Status: DC

## 2022-12-27 MED ORDER — BUPROPION HCL ER (XL) 300 MG PO TB24: 300.0000 mg | ORAL_TABLET | Freq: Every day | ORAL | 3 refills | Status: DC

## 2022-12-27 MED ORDER — PAROXETINE HCL 20 MG PO TABS: 20.0000 mg | ORAL_TABLET | Freq: Every day | ORAL | 3 refills | Status: DC

## 2022-12-27 MED ORDER — ONDANSETRON HCL 4 MG PO TABS: 4.0000 mg | ORAL_TABLET | Freq: Three times a day (TID) | ORAL | 0 refills | Status: DC | PRN

## 2022-12-27 MED ORDER — MONTELUKAST SODIUM 10 MG PO TABS: 10.0000 mg | ORAL_TABLET | Freq: Every day | ORAL | 3 refills | Status: AC

## 2022-12-27 NOTE — Progress Notes (Unsigned)
   Subjective:   Patient ID: [name], female    DOB: 1955/12/22, 67 y.o.   MRN: [id number]  HPI The patient is here for physical.  PMH, [name], social history reviewed and updated  Review of Systems  Objective:  Physical Exam  Vitals:   12/27/22 1452 12/27/22 1454  BP: (!) 160/80 (!) 160/80  Pulse: (!) 54   Temp: 98.3 F (36.8 C)   TempSrc: Oral   SpO2: 98%   Weight: 249 lb (112.9 kg)   Height: 5\' 5"  (1.651 m)     Assessment & Plan:

## 2022-12-27 NOTE — Patient Instructions (Addendum)
Check on coverage for wegovy or zepbound and let us know if not covered we can try something different.

## 2022-12-28 ENCOUNTER — Encounter: Payer: Self-pay | Admitting: Internal Medicine

## 2022-12-29 ENCOUNTER — Ambulatory Visit: Payer: Self-pay

## 2022-12-29 DIAGNOSIS — I10 Essential (primary) hypertension: Secondary | ICD-10-CM

## 2022-12-29 MED ORDER — SEMAGLUTIDE-WEIGHT MANAGEMENT 2.4 MG/0.75ML ~~LOC~~ SOAJ: 2.4000 mg | SUBCUTANEOUS | 0 refills | Status: DC

## 2022-12-29 MED ORDER — SEMAGLUTIDE-WEIGHT MANAGEMENT 0.25 MG/0.5ML ~~LOC~~ SOAJ: 0.2500 mg | SUBCUTANEOUS | 0 refills | Status: DC

## 2022-12-29 MED ORDER — SEMAGLUTIDE-WEIGHT MANAGEMENT 1 MG/0.5ML ~~LOC~~ SOAJ: 1.0000 mg | SUBCUTANEOUS | 0 refills | Status: DC

## 2022-12-29 MED ORDER — SEMAGLUTIDE-WEIGHT MANAGEMENT 1.7 MG/0.75ML ~~LOC~~ SOAJ: 1.7000 mg | SUBCUTANEOUS | 0 refills | Status: DC

## 2022-12-29 MED ORDER — SEMAGLUTIDE-WEIGHT MANAGEMENT 0.5 MG/0.5ML ~~LOC~~ SOAJ: 0.5000 mg | SUBCUTANEOUS | 0 refills | Status: DC

## 2022-12-29 NOTE — Patient Outreach (Signed)
  Care Coordination   Follow Up Visit Note   12/29/2022 Name: [name] MRN: [id number] DOB: [birth year]  [name] is a 67 y.o. year old female who sees Myrlene Broker, MD for primary care. I spoke with  [name] by phone today.  What matters to the patients health and wellness today?  Ms. Moy continues with weight loss efforts. She reports difficulty with getting transportation to rehab sessions at [facility]. She states she was prescribed Wegovy, but is unable to afford. She reports has been to weight management center with [facility], but did not like working with the person assigned to her. She reports has [employer] engineer benefit with [facility].  Goals Addressed             This Visit's Progress    Care coordination activities       Interventions Today    Flowsheet Row Most Recent Value  Chronic Disease   Chronic disease during today's visit Diabetes, Other  [weight loss efforts]  General Interventions   General Interventions Discussed/Reviewed General Interventions Reviewed  Exercise Interventions   Exercise Discussed/Reviewed Physical Activity, Exercise Reviewed  Physical Activity Discussed/Reviewed Types of exercise, Physical Activity Reviewed  [Provided contact number to [facility] with Senior [facility] center [phone],  reiterated [facility] resource]  Education Interventions   Education Provided Provided Education  [advised continued activity as tolerated, monitor food choices, discussed [employer] for activities/nutriton and cooking demos.]  Provided Verbal Education On --  [advised to discuss with provider referral to another weight loss center or go back to same center and request a different person to work with.  Provided transportation resource contact number with Senior resource center.]  Nutrition Interventions   Nutrition Discussed/Reviewed Nutrition Discussed  Pharmacy  Interventions   Pharmacy Dicussed/Reviewed Pharmacy Topics Reviewed, Referral to Pharmacist, Affording Medications  [unable to afford Wegovy]  Referral to Pharmacist Cannot afford medications  [unable to afford Wegovy-patient request understaning/assistance with DVVOHY cost]            SDOH assessments and interventions completed:  Yes  SDOH Interventions Today    Flowsheet Row Most Recent Value  SDOH Interventions   Transportation Interventions Intervention Not Indicated, Other (Comment)  [referral to care guide]     Care Coordination Interventions:  Yes, provided   Follow up plan: Follow up call scheduled for 01/31/23    Encounter Outcome:  Patient Visit Completed

## 2022-12-29 NOTE — Patient Instructions (Signed)
Visit Information  Thank you for taking time to visit with me today. Please don't hesitate to contact me if I can be of assistance to you.   Following are the goals we discussed today:  Continue to take medications as prescribed. Continue to attend provider visits as scheduled Continue to eat healthy, lean meats, vegetables, fruits, avoid saturated and transfats See [facility] Below: [employer] [phone] [facility] Center with [employer] of [place] [phone] [facility] [phone] [facility] [phone]   Our next appointment is by telephone on 01/31/23 at 1:15 pm  Please call the care guide team at [phone] if you need to cancel or reschedule your appointment.   If you are experiencing a Mental Health or Behavioral Health Crisis or need someone to talk to, please call the Suicide and Crisis Lifeline: 988 call the [place] National Suicide Prevention Lifeline: (984)528-3556 or TTY: (416) 129-2165 TTY 640-639-7140) to talk to a trained counselor call 1-800-273-TALK (toll free, 24 hour hotline)  Kathyrn Sheriff, RN, MSN, BSN, CCM Care Management Coordinator [phone]

## 2022-12-30 ENCOUNTER — Encounter: Payer: Self-pay | Admitting: Internal Medicine

## 2022-12-30 ENCOUNTER — Telehealth: Payer: Self-pay | Admitting: *Deleted

## 2022-12-30 NOTE — Progress Notes (Signed)
  Care Coordination Note  12/30/2022 Name: [name] MRN: [id number] DOB: Dec 06, 1955  [name] is a 67 y.o. year old female who is a primary care patient of Myrlene Broker, MD and is actively engaged with the care management team. I reached out to [name] by phone today to assist with scheduling an initial visit with the BSW  Follow up plan: Telephone appointment with care management team member scheduled for: 01/04/2023  Burman Nieves, [facility] Care Coordination Care Guide Direct Dial: [phone]

## 2022-12-30 NOTE — Assessment & Plan Note (Signed)
Recent HgA1c acceptable and no recurrence. She is working on [employer] management to avoid recurrence.

## 2022-12-30 NOTE — Assessment & Plan Note (Signed)
Flu shot yearly. Pneumonia complete. Shingrix complete. Tetanus due 2026. Colonoscopy due 2032. Mammogram due 2025, pap smear aged out and dexa complete. Counseled about sun safety and mole surveillance. Counseled about the dangers of distracted driving. Given 10 year screening recommendations.

## 2022-12-30 NOTE — Assessment & Plan Note (Signed)
BP initially elevated but at goal on recheck. Taking amlodipine 10 mg daily and torsemide 20 mg three times a week as needed. Recent labs stable.

## 2022-12-30 NOTE — Assessment & Plan Note (Addendum)
Stable and taking wellbutrin 300 mg daily and paxil 20 mg daily. Continue adequate control.

## 2022-12-30 NOTE — Assessment & Plan Note (Signed)
No new symptoms today. Needs caution with any antibiotics.

## 2022-12-30 NOTE — Assessment & Plan Note (Signed)
Taking singulair and refilled.

## 2022-12-30 NOTE — Assessment & Plan Note (Signed)
Rx wegovy to help with weight. Standard titration and counseled about benefit/risk.

## 2022-12-31 ENCOUNTER — Telehealth: Payer: Self-pay

## 2022-12-31 NOTE — Telephone Encounter (Signed)
Telephone encounter was:  Successful.  12/31/2022 Name: [name] MRN: [id number] DOB: 08/10/1955  [name] is a 67 y.o. year old female who is a primary care patient of Okey Dupre Austin Miles, MD . The community resource team was consulted for assistance with Transportation Needs   Care guide performed the following interventions: Called patient to confirm she has received call from [facility]. Patient confirmed she received call. Patient informed me she did not need a ride for 01/07/23 cancelled ride per request telephonic visit.  Follow Up Plan:  No further follow up planned at this time. The patient has been provided with needed resources.  Ride ID [id number] Ride Date 01/15/2023 Pickup time 10:40 am [facility] address [address], [place] [address] Drop off [address] [place]  Standard Vehicle Door to [employer] will give patient a card with the number to call for pickup after appointment.  Cooper Stamp [facility]  [facility], [facility] Guide Direct Dial: [phone]  Website: [email or web address]

## 2022-12-31 NOTE — Telephone Encounter (Signed)
Telephone encounter was:  Successful.  12/31/2022 Name: PRISILA NEUNER MRN: [id number] DOB: 13-May-1955  [name] is a 67 y.o. year old female who is a primary care patient of Myrlene Broker, MD . The community resource team was consulted for assistance with Transportation Needs   Care guide performed the following interventions: Spoke with patient to arrange transportation for appointments on 9/20 and 01/15/2023. Sending request to [employer]. Will call patient once I receive confirmation. Confirmed patient's address to mail SCAT application.  Follow Up Plan:  Will call patient once I receive confirmation from [employer].  12/31/2022  [name] DOB: [birth year] MRN: [id number]   RIDER WAIVER AND RELEASE OF LIABILITY  For the purposes of helping with transportation needs, [facility] partners with outside transportation providers (taxi companies, [place], Catering manager.) to give [employer] patients or other approved people the choice of on-demand rides [employer]") to our buildings for non-emergency visits.  By using [employer], I, the person signing this document, on behalf of myself and/or any legal minors (in my care using the [employer]), agree:  Science writer given to me are supplied by independent, outside transportation providers who do not work for, or have any affiliation with, [employer]. [employer] is not a transportation company. [facility] has no control over the quality or safety of the rides I get using [employer]. [facility] has no control over whether any outside ride will happen on time or not. [facility] gives no guarantee on the reliability, quality, safety, or availability on any rides, or that no mistakes will happen. I know and accept that traveling by vehicle (car, truck, SVU, Zenaida Niece, bus, taxi, etc.) has risks of serious injuries such as disability, being paralyzed, and death. I know and agree the risk of using  [employer] is mine alone, and not [employer]. Transport Services are provided "as is" and as are available. The transportation providers are in charge for all inspections and care of the vehicles used to provide these rides. I agree not to take legal action against [facility], its agents, employees, officers, directors, representatives, insurers, attorneys, assigns, successors, subsidiaries, and affiliates at any time for any reasons related directly or indirectly to using [employer]. I also agree not to take legal action against [facility] or its affiliates for any injury, death, or damage to property caused by or related to using [employer]. I have read this Waiver and Release of Liability, and I understand the terms used in it and their legal meaning. This Waiver is freely and voluntarily given with the understanding that my right (or any legal minors) to legal action against  relating to [employer] is knowingly given up to use these services.   I attest that I read the Ride Waiver and Release of Liability to [name], gave Ms. Ciolek the opportunity to ask questions and answered the questions asked (if any). I affirm that [name] then provided consent for assistance with transportation.     Tevyn Codd D Mareta Chesnut [facility]  [facility], [facility] Guide Direct Dial: [phone]  Website: [email or web address]

## 2023-01-04 ENCOUNTER — Ambulatory Visit: Payer: Self-pay

## 2023-01-04 NOTE — Patient Instructions (Signed)
Visit Information  Thank you for taking time to visit with me today. Please don't hesitate to contact me if I can be of assistance to you.   Following are the goals we discussed today:  - Call [facility], DSS, [employer], and your church to request assistance with utility payments -Engage with [employer] regarding grocery delivery - they will call you -Engage with [employer] regarding transportation - they will call you -Review mailed resources  Our next appointment is by telephone on 9/30 at 11:00 am  Please call the care guide team at [phone] if you need to cancel or reschedule your appointment.   If you are experiencing a Mental Health or Behavioral Health Crisis or need someone to talk to, please call 1-800-273-TALK (toll free, 24 hour hotline) go to [facility] [address], [place] [phone]) call 911  Patient verbalizes understanding of instructions and care plan provided today and agrees to view in MyChart. Active MyChart status and patient understanding of how to access instructions and care plan via MyChart confirmed with patient.     Bevelyn Ngo, BSW, CDP [facility]  [facility], [facility] Social Worker Direct Dial: [phone]  Fax: [phone]
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
## 2023-01-05 DIAGNOSIS — K219 Gastro-esophageal reflux disease without esophagitis: Secondary | ICD-10-CM | POA: Diagnosis not present

## 2023-01-05 DIAGNOSIS — K8681 Exocrine pancreatic insufficiency: Secondary | ICD-10-CM | POA: Diagnosis not present

## 2023-01-06 ENCOUNTER — Encounter: Payer: Self-pay | Admitting: Internal Medicine

## 2023-01-06 ENCOUNTER — Telehealth: Payer: Medicare HMO | Admitting: Physician Assistant

## 2023-01-06 DIAGNOSIS — J069 Acute upper respiratory infection, unspecified: Secondary | ICD-10-CM | POA: Diagnosis not present

## 2023-01-06 MED ORDER — BENZONATATE 100 MG PO CAPS
100.0000 mg | ORAL_CAPSULE | Freq: Three times a day (TID) | ORAL | 0 refills | Status: DC | PRN
Start: 2023-01-06 — End: 2023-02-24

## 2023-01-06 NOTE — Progress Notes (Signed)

## 2023-01-06 NOTE — Progress Notes (Signed)
I have spent 5 minutes in review of e-visit questionnaire, review and updating patient chart, medical decision making and response to patient.   Mia Milan Cody Jacklynn Dehaas, PA-C    

## 2023-01-07 ENCOUNTER — Ambulatory Visit: Payer: Medicare HMO | Admitting: Licensed Clinical Social Worker

## 2023-01-07 NOTE — Progress Notes (Deleted)
[facility] Counselor/Therapist Progress Note  Patient ID: VEGAS RITTNER, MRN: [id number]    Date: 01/07/23  Time Spent: ***  {LBBHAMPM:26719} - *** {LBBHAMPM:26719} : *** Minutes  Treatment Type: Individual Therapy.  Reported Symptoms: ***  Mental Status Exam: Appearance:  {PSY:22683}     Behavior: {PSY:21022743}  Motor: {PSY:22302}  Speech/Language:  {PSY:22685}  Affect: {PSY:22687}  Mood: {PSY:31886}  Thought process: {PSY:31888}  Thought content:   {PSY:309-410-7248}  Sensory/Perceptual disturbances:   {PSY:703-495-4566}  Orientation: {PSY:30297}  Attention: {PSY:22877}  Concentration: {PSY:208-310-8360}  Memory: {PSY:778-061-9299}  Fund of knowledge:  {PSY:208-310-8360}  Insight:   {PSY:208-310-8360}  Judgment:  {PSY:208-310-8360}  Impulse Control: {PSY:208-310-8360}   Risk Assessment: Danger to Self:  {PSY:22692} Self-injurious Behavior: {PSY:22692} Danger to Others: {PSY:22692} Duty to Warn:{PSY:311194} Physical Aggression / Violence:{PSY:21197} Access to Firearms a concern: {PSY:21197} Gang Involvement:{PSY:21197}  Subjective:   [name] participated from {Patient Location:26691::"home"}, via {LBBHVIDEOORPHONE:26720}, and consented to treatment. Therapist participated from {LBBHPROVIDERLOCATION:26721}. We met online due to COVID pandemic.   ***   Interventions: {PSY:407 873 3930}  Diagnosis: No diagnosis found.   Plan: ***Patient is to use CBT, mindfulness and coping skills to help manage decrease symptoms associated with their diagnosis.   Long-term goal:   ***Reduce overall level, frequency, and intensity of the feelings of depression, anxiety and panic evidenced by       decreased irritability, negative self talk, and helpless feelings from 6 to 7 days/week to 0 to 1 days/week per client report for at least 3 consecutive months.  Short-term goal:  ***Verbally express understanding of the relationship between feelings of depression,  anxiety and their impact on thinking patterns and behaviors. Verbalize an understanding of the role that distorted thinking plays in creating fears, excessive worry, and ruminations.  [name] MSW, LCSW/DATE

## 2023-01-11 ENCOUNTER — Telehealth: Payer: Self-pay

## 2023-01-11 ENCOUNTER — Encounter: Payer: Self-pay | Admitting: Internal Medicine

## 2023-01-11 ENCOUNTER — Telehealth (INDEPENDENT_AMBULATORY_CARE_PROVIDER_SITE_OTHER): Payer: Medicare HMO | Admitting: Internal Medicine

## 2023-01-11 DIAGNOSIS — N390 Urinary tract infection, site not specified: Secondary | ICD-10-CM

## 2023-01-11 DIAGNOSIS — J069 Acute upper respiratory infection, unspecified: Secondary | ICD-10-CM | POA: Diagnosis not present

## 2023-01-11 MED ORDER — CEPHALEXIN 500 MG PO CAPS: 500.0000 mg | ORAL_CAPSULE | Freq: Two times a day (BID) | ORAL | 0 refills | Status: DC

## 2023-01-11 NOTE — Assessment & Plan Note (Signed)
No home covid-19 test. Outside window for treatment currently. Advised to keep using zyrtec, flonase and tessalon perles.

## 2023-01-11 NOTE — Progress Notes (Signed)
Virtual Visit via Video Note  I connected with [name] on 01/11/23 at  2:40 PM EDT by a video enabled telemedicine application and verified that I am speaking with the correct person using two identifiers.  The patient and the provider were at separate locations throughout the entire encounter. Patient location: home, Provider location: work   I discussed the limitations of evaluation and management by telemedicine and the availability of in person appointments. The patient expressed understanding and agreed to proceed. The patient and the provider were the only parties present for the visit unless noted in HPI below.  History of Present Illness: The patient is a 67 y.o. female with visit for viral uri symptoms. Started about 5-6 days ago. Has taken tessalon perles and too much coughing has caused dizziness. Appetite is poor, eating minimal. New burning when peeing.  Observations/Objective: Appearance: normal, breathing appears normal minimal coughing during visit, casual grooming, mental status is A and O times 3  Assessment and Plan: See problem oriented charting  Follow Up Instructions: rx keflex for likely uti, would cover sinus/respiratory infection if needed  I discussed the assessment and treatment plan with the patient. The patient was provided an opportunity to ask questions and all were answered. The patient agreed with the plan and demonstrated an understanding of the instructions.   The patient was advised to call back or seek an in-person evaluation if the symptoms worsen or if the condition fails to improve as anticipated.  Myrlene Broker, MD

## 2023-01-11 NOTE — Assessment & Plan Note (Signed)
Rx keflex 500 mg 5 day course with new symptoms.

## 2023-01-13 ENCOUNTER — Other Ambulatory Visit (HOSPITAL_COMMUNITY): Payer: Self-pay

## 2023-01-13 ENCOUNTER — Telehealth: Payer: Self-pay

## 2023-01-13 NOTE — Telephone Encounter (Signed)
Pharmacy Patient Advocate Encounter   Received notification from Pt Calls Messages that prior authorization for Wegovy 0.25mg /0.68ml is required/requested.   Insurance verification completed.   The patient is insured through [employer] .   Per test claim: PA required; PA submitted to [facility] via CoverMyMeds Key/confirmation #/EOC  [id number] Status is pending

## 2023-01-14 NOTE — Progress Notes (Deleted)
Erroneous encounter

## 2023-01-14 NOTE — Progress Notes (Deleted)
[facility] Counselor/Therapist Progress Note  Patient ID: [name], MRN: [id number]    Date: 01/14/23  Time Spent: ***  {LBBHAMPM:26719} - *** {LBBHAMPM:26719} : *** Minutes  Treatment Type: Individual Therapy.  Reported Symptoms: ***  Mental Status Exam: Appearance:  {PSY:22683}     Behavior: {PSY:21022743}  Motor: {PSY:22302}  Speech/Language:  {PSY:22685}  Affect: {PSY:22687}  Mood: {PSY:31886}  Thought process: {PSY:31888}  Thought content:   {PSY:[phone]}  Sensory/Perceptual disturbances:   {PSY:[phone]}  Orientation: {PSY:30297}  Attention: {PSY:22877}  Concentration: {[phone]}  Memory: {PSY:737 407 2997}  Fund of knowledge:  {[phone]}  Insight:   {[phone]}  Judgment:  {[phone]}  Impulse Control: {[phone]}   Risk Assessment: Danger to Self:  {PSY:22692} Self-injurious Behavior: {PSY:22692} Danger to Others: {PSY:22692} Duty to Warn:{PSY:311194} Physical Aggression / Violence:{PSY:21197} Access to Firearms a concern: {PSY:21197} Gang Involvement:{PSY:21197}  Subjective:   [name] participated from {Patient Location:26[address]::"home"}, via {LBBHVIDEOORPHONE:26720}, and consented to treatment. Therapist participated from {LBBHPROVIDERLOCATION:26721}. We met online due to COVID pandemic.   ***   Interventions: {PSY:[phone]}  Diagnosis: Major depressive disorder, recurrent episode, moderate (HCC)   Plan: ***Patient is to use CBT, mindfulness and coping skills to help manage decrease symptoms associated with their diagnosis.   Long-term goal:   ***Reduce overall level, frequency, and intensity of the feelings of depression, anxiety and panic evidenced by       decreased irritability, negative self talk, and helpless feelings from 6 to 7 days/week to 0 to 1 days/week per client report for at least 3 consecutive months.  Short-term goal:  ***Verbally express understanding of the  relationship between feelings of depression, anxiety and their impact on thinking patterns and behaviors. Verbalize an understanding of the role that distorted thinking plays in creating fears, excessive worry, and ruminations.  [name] MSW, LCSW/DATE

## 2023-01-15 ENCOUNTER — Observation Stay (HOSPITAL_COMMUNITY): Payer: Medicare HMO

## 2023-01-15 ENCOUNTER — Emergency Department (HOSPITAL_COMMUNITY): Payer: Medicare HMO

## 2023-01-15 ENCOUNTER — Ambulatory Visit
Admission: RE | Admit: 2023-01-15 | Discharge: 2023-01-15 | Disposition: A | Discharge status: Home / Self Care | Payer: Medicare HMO | Source: Ambulatory Visit | Attending: Family Medicine | Admitting: Family Medicine

## 2023-01-15 ENCOUNTER — Inpatient Hospital Stay (HOSPITAL_COMMUNITY)
Admission: EM | Admit: 2023-01-15 | Discharge: 2023-01-25 | DRG: 193 | Disposition: A | Discharge status: Home Health | Payer: Medicare HMO | Source: Ambulatory Visit | Attending: Internal Medicine | Admitting: Internal Medicine

## 2023-01-15 ENCOUNTER — Other Ambulatory Visit: Payer: Self-pay

## 2023-01-15 ENCOUNTER — Encounter (HOSPITAL_COMMUNITY): Payer: Self-pay

## 2023-01-15 DIAGNOSIS — Z95828 Presence of other vascular implants and grafts: Secondary | ICD-10-CM

## 2023-01-15 DIAGNOSIS — J984 Other disorders of lung: Secondary | ICD-10-CM | POA: Diagnosis not present

## 2023-01-15 DIAGNOSIS — Z8619 Personal history of other infectious and parasitic diseases: Secondary | ICD-10-CM

## 2023-01-15 DIAGNOSIS — E1140 Type 2 diabetes mellitus with diabetic neuropathy, unspecified: Secondary | ICD-10-CM | POA: Diagnosis present

## 2023-01-15 DIAGNOSIS — R7401 Elevation of levels of liver transaminase levels: Principal | ICD-10-CM | POA: Diagnosis present

## 2023-01-15 DIAGNOSIS — A498 Other bacterial infections of unspecified site: Secondary | ICD-10-CM | POA: Diagnosis present

## 2023-01-15 DIAGNOSIS — G609 Hereditary and idiopathic neuropathy, unspecified: Secondary | ICD-10-CM

## 2023-01-15 DIAGNOSIS — Z79899 Other long term (current) drug therapy: Secondary | ICD-10-CM

## 2023-01-15 DIAGNOSIS — E860 Dehydration: Secondary | ICD-10-CM | POA: Diagnosis present

## 2023-01-15 DIAGNOSIS — M797 Fibromyalgia: Secondary | ICD-10-CM | POA: Diagnosis present

## 2023-01-15 DIAGNOSIS — Z823 Family history of stroke: Secondary | ICD-10-CM

## 2023-01-15 DIAGNOSIS — D649 Anemia, unspecified: Secondary | ICD-10-CM | POA: Diagnosis not present

## 2023-01-15 DIAGNOSIS — R110 Nausea: Secondary | ICD-10-CM | POA: Diagnosis not present

## 2023-01-15 DIAGNOSIS — M79604 Pain in right leg: Secondary | ICD-10-CM | POA: Diagnosis present

## 2023-01-15 DIAGNOSIS — R42 Dizziness and giddiness: Principal | ICD-10-CM

## 2023-01-15 DIAGNOSIS — T391X5A Adverse effect of 4-Aminophenol derivatives, initial encounter: Secondary | ICD-10-CM | POA: Diagnosis present

## 2023-01-15 DIAGNOSIS — J45909 Unspecified asthma, uncomplicated: Secondary | ICD-10-CM | POA: Diagnosis present

## 2023-01-15 DIAGNOSIS — I639 Cerebral infarction, unspecified: Secondary | ICD-10-CM | POA: Diagnosis not present

## 2023-01-15 DIAGNOSIS — Z888 Allergy status to other drugs, medicaments and biological substances status: Secondary | ICD-10-CM

## 2023-01-15 DIAGNOSIS — R29818 Other symptoms and signs involving the nervous system: Secondary | ICD-10-CM | POA: Diagnosis not present

## 2023-01-15 DIAGNOSIS — D509 Iron deficiency anemia, unspecified: Secondary | ICD-10-CM | POA: Diagnosis present

## 2023-01-15 DIAGNOSIS — R011 Cardiac murmur, unspecified: Secondary | ICD-10-CM | POA: Diagnosis present

## 2023-01-15 DIAGNOSIS — Z86718 Personal history of other venous thrombosis and embolism: Secondary | ICD-10-CM

## 2023-01-15 DIAGNOSIS — R197 Diarrhea, unspecified: Secondary | ICD-10-CM | POA: Diagnosis not present

## 2023-01-15 DIAGNOSIS — Z9049 Acquired absence of other specified parts of digestive tract: Secondary | ICD-10-CM

## 2023-01-15 DIAGNOSIS — F449 Dissociative and conversion disorder, unspecified: Secondary | ICD-10-CM | POA: Diagnosis not present

## 2023-01-15 DIAGNOSIS — E559 Vitamin D deficiency, unspecified: Secondary | ICD-10-CM | POA: Diagnosis present

## 2023-01-15 DIAGNOSIS — R4182 Altered mental status, unspecified: Secondary | ICD-10-CM | POA: Diagnosis not present

## 2023-01-15 DIAGNOSIS — H5500 Unspecified nystagmus: Secondary | ICD-10-CM | POA: Diagnosis present

## 2023-01-15 DIAGNOSIS — Z9884 Bariatric surgery status: Secondary | ICD-10-CM

## 2023-01-15 DIAGNOSIS — G8929 Other chronic pain: Secondary | ICD-10-CM | POA: Diagnosis present

## 2023-01-15 DIAGNOSIS — G8191 Hemiplegia, unspecified affecting right dominant side: Secondary | ICD-10-CM | POA: Diagnosis not present

## 2023-01-15 DIAGNOSIS — Z860100 Personal history of colon polyps, unspecified: Secondary | ICD-10-CM

## 2023-01-15 DIAGNOSIS — K8681 Exocrine pancreatic insufficiency: Secondary | ICD-10-CM | POA: Diagnosis present

## 2023-01-15 DIAGNOSIS — E876 Hypokalemia: Secondary | ICD-10-CM | POA: Diagnosis present

## 2023-01-15 DIAGNOSIS — U071 COVID-19: Secondary | ICD-10-CM | POA: Diagnosis present

## 2023-01-15 DIAGNOSIS — J189 Pneumonia, unspecified organism: Principal | ICD-10-CM | POA: Diagnosis present

## 2023-01-15 DIAGNOSIS — Z841 Family history of disorders of kidney and ureter: Secondary | ICD-10-CM

## 2023-01-15 DIAGNOSIS — G4733 Obstructive sleep apnea (adult) (pediatric): Secondary | ICD-10-CM | POA: Diagnosis present

## 2023-01-15 DIAGNOSIS — R918 Other nonspecific abnormal finding of lung field: Secondary | ICD-10-CM | POA: Diagnosis not present

## 2023-01-15 DIAGNOSIS — H53461 Homonymous bilateral field defects, right side: Secondary | ICD-10-CM | POA: Diagnosis present

## 2023-01-15 DIAGNOSIS — Z6841 Body Mass Index (BMI) 40.0 and over, adult: Secondary | ICD-10-CM | POA: Diagnosis not present

## 2023-01-15 DIAGNOSIS — R7989 Other specified abnormal findings of blood chemistry: Secondary | ICD-10-CM | POA: Diagnosis present

## 2023-01-15 DIAGNOSIS — M542 Cervicalgia: Secondary | ICD-10-CM

## 2023-01-15 DIAGNOSIS — F32A Depression, unspecified: Secondary | ICD-10-CM | POA: Diagnosis present

## 2023-01-15 DIAGNOSIS — R10819 Abdominal tenderness, unspecified site: Secondary | ICD-10-CM | POA: Diagnosis present

## 2023-01-15 DIAGNOSIS — I951 Orthostatic hypotension: Secondary | ICD-10-CM

## 2023-01-15 DIAGNOSIS — R059 Cough, unspecified: Secondary | ICD-10-CM | POA: Diagnosis not present

## 2023-01-15 DIAGNOSIS — I10 Essential (primary) hypertension: Secondary | ICD-10-CM | POA: Diagnosis not present

## 2023-01-15 DIAGNOSIS — I110 Hypertensive heart disease with heart failure: Secondary | ICD-10-CM | POA: Diagnosis present

## 2023-01-15 DIAGNOSIS — R10827 Generalized rebound abdominal tenderness: Secondary | ICD-10-CM | POA: Diagnosis not present

## 2023-01-15 DIAGNOSIS — Z7982 Long term (current) use of aspirin: Secondary | ICD-10-CM

## 2023-01-15 DIAGNOSIS — Z820 Family history of epilepsy and other diseases of the nervous system: Secondary | ICD-10-CM

## 2023-01-15 DIAGNOSIS — F419 Anxiety disorder, unspecified: Secondary | ICD-10-CM | POA: Diagnosis present

## 2023-01-15 DIAGNOSIS — Z8249 Family history of ischemic heart disease and other diseases of the circulatory system: Secondary | ICD-10-CM

## 2023-01-15 DIAGNOSIS — Z8639 Personal history of other endocrine, nutritional and metabolic disease: Secondary | ICD-10-CM | POA: Diagnosis not present

## 2023-01-15 DIAGNOSIS — I6523 Occlusion and stenosis of bilateral carotid arteries: Secondary | ICD-10-CM | POA: Diagnosis not present

## 2023-01-15 DIAGNOSIS — Z87440 Personal history of urinary (tract) infections: Secondary | ICD-10-CM

## 2023-01-15 DIAGNOSIS — Z8042 Family history of malignant neoplasm of prostate: Secondary | ICD-10-CM

## 2023-01-15 DIAGNOSIS — R4701 Aphasia: Secondary | ICD-10-CM | POA: Diagnosis not present

## 2023-01-15 DIAGNOSIS — R29810 Facial weakness: Secondary | ICD-10-CM | POA: Diagnosis not present

## 2023-01-15 DIAGNOSIS — R109 Unspecified abdominal pain: Secondary | ICD-10-CM | POA: Diagnosis present

## 2023-01-15 DIAGNOSIS — I5032 Chronic diastolic (congestive) heart failure: Secondary | ICD-10-CM | POA: Diagnosis present

## 2023-01-15 DIAGNOSIS — K439 Ventral hernia without obstruction or gangrene: Secondary | ICD-10-CM | POA: Diagnosis not present

## 2023-01-15 DIAGNOSIS — I6782 Cerebral ischemia: Secondary | ICD-10-CM | POA: Diagnosis not present

## 2023-01-15 DIAGNOSIS — Z87891 Personal history of nicotine dependence: Secondary | ICD-10-CM

## 2023-01-15 DIAGNOSIS — Z833 Family history of diabetes mellitus: Secondary | ICD-10-CM

## 2023-01-15 DIAGNOSIS — Z8719 Personal history of other diseases of the digestive system: Secondary | ICD-10-CM

## 2023-01-15 DIAGNOSIS — R569 Unspecified convulsions: Secondary | ICD-10-CM | POA: Diagnosis not present

## 2023-01-15 DIAGNOSIS — Z882 Allergy status to sulfonamides status: Secondary | ICD-10-CM

## 2023-01-15 DIAGNOSIS — G4700 Insomnia, unspecified: Secondary | ICD-10-CM | POA: Diagnosis present

## 2023-01-15 DIAGNOSIS — G459 Transient cerebral ischemic attack, unspecified: Secondary | ICD-10-CM | POA: Diagnosis not present

## 2023-01-15 DIAGNOSIS — G629 Polyneuropathy, unspecified: Secondary | ICD-10-CM

## 2023-01-15 DIAGNOSIS — N281 Cyst of kidney, acquired: Secondary | ICD-10-CM | POA: Diagnosis not present

## 2023-01-15 DIAGNOSIS — I700 Atherosclerosis of aorta: Secondary | ICD-10-CM | POA: Diagnosis not present

## 2023-01-15 DIAGNOSIS — T391X1A Poisoning by 4-Aminophenol derivatives, accidental (unintentional), initial encounter: Secondary | ICD-10-CM | POA: Diagnosis not present

## 2023-01-15 DIAGNOSIS — K5730 Diverticulosis of large intestine without perforation or abscess without bleeding: Secondary | ICD-10-CM | POA: Diagnosis not present

## 2023-01-15 DIAGNOSIS — M79605 Pain in left leg: Secondary | ICD-10-CM | POA: Diagnosis present

## 2023-01-15 LAB — CBC WITH DIFFERENTIAL/PLATELET
Abs Immature Granulocytes: 0.04 10*3/uL (ref 0.00–0.07)
Basophils Absolute: 0 10*3/uL (ref 0.0–0.1)
Basophils Relative: 0 %
Eosinophils Absolute: 0.1 10*3/uL (ref 0.0–0.5)
Eosinophils Relative: 1 %
HCT: 37 % (ref 36.0–46.0)
Hemoglobin: 11.6 g/dL — ABNORMAL LOW (ref 12.0–15.0)
Immature Granulocytes: 1 %
Lymphocytes Relative: 19 %
Lymphs Abs: 1.5 10*3/uL (ref 0.7–4.0)
MCH: 26.2 pg (ref 26.0–34.0)
MCHC: 31.4 g/dL (ref 30.0–36.0)
MCV: 83.7 fL (ref 80.0–100.0)
Monocytes Absolute: 0.6 10*3/uL (ref 0.1–1.0)
Monocytes Relative: 7 %
Neutro Abs: 5.9 10*3/uL (ref 1.7–7.7)
Neutrophils Relative %: 72 %
Platelets: 345 10*3/uL (ref 150–400)
RBC: 4.42 MIL/uL (ref 3.87–5.11)
RDW: 16.6 % — ABNORMAL HIGH (ref 11.5–15.5)
WBC: 8.2 10*3/uL (ref 4.0–10.5)
nRBC: 0 % (ref 0.0–0.2)

## 2023-01-15 LAB — COMPREHENSIVE METABOLIC PANEL
ALT: 252 U/L — ABNORMAL HIGH (ref 0–44)
AST: 716 U/L — ABNORMAL HIGH (ref 15–41)
Albumin: 3 g/dL — ABNORMAL LOW (ref 3.5–5.0)
Alkaline Phosphatase: 188 U/L — ABNORMAL HIGH (ref 38–126)
Anion gap: 3 — ABNORMAL LOW (ref 5–15)
BUN: 10 mg/dL (ref 8–23)
CO2: 27 mmol/L (ref 22–32)
Calcium: 8.3 mg/dL — ABNORMAL LOW (ref 8.9–10.3)
Chloride: 112 mmol/L — ABNORMAL HIGH (ref 98–111)
Creatinine, Ser: 0.77 mg/dL (ref 0.44–1.00)
GFR, Estimated: >60 mL/min (ref >60)
Glucose, Bld: 90 mg/dL (ref 70–99)
Potassium: 3.4 mmol/L — ABNORMAL LOW (ref 3.5–5.1)
Sodium: 142 mmol/L (ref 135–145)
Total Bilirubin: 1.1 mg/dL (ref 0.3–1.2)
Total Protein: 6.3 g/dL — ABNORMAL LOW (ref 6.5–8.1)

## 2023-01-15 LAB — RETICULOCYTES
Immature Retic Fract: 19 % — ABNORMAL HIGH (ref 2.3–15.9)
RBC.: 4.3 MIL/uL (ref 3.87–5.11)
Retic Count, Absolute: 43.9 10*3/uL (ref 19.0–186.0)
Retic Ct Pct: 1 % (ref 0.4–3.1)

## 2023-01-15 LAB — FERRITIN: Ferritin: 189 ng/mL (ref 11–307)

## 2023-01-15 LAB — BASIC METABOLIC PANEL
Anion gap: 8 (ref 5–15)
BUN: 12 mg/dL (ref 8–23)
CO2: 21 mmol/L — ABNORMAL LOW (ref 22–32)
Calcium: 8.4 mg/dL — ABNORMAL LOW (ref 8.9–10.3)
Chloride: 112 mmol/L — ABNORMAL HIGH (ref 98–111)
Creatinine, Ser: 0.64 mg/dL (ref 0.44–1.00)
GFR, Estimated: >60 mL/min (ref >60)
Glucose, Bld: 116 mg/dL — ABNORMAL HIGH (ref 70–99)
Potassium: 3.5 mmol/L (ref 3.5–5.1)
Sodium: 141 mmol/L (ref 135–145)

## 2023-01-15 LAB — IRON AND TIBC
Iron: 57 ug/dL (ref 28–170)
Saturation Ratios: 17 % (ref 10.4–31.8)
TIBC: 333 ug/dL (ref 250–450)
UIBC: 276 ug/dL

## 2023-01-15 LAB — CK: Total CK: 63 U/L (ref 38–234)

## 2023-01-15 LAB — LACTIC ACID, PLASMA: Lactic Acid, Venous: 0.6 mmol/L (ref 0.5–1.9)

## 2023-01-15 LAB — PROCALCITONIN: Procalcitonin: 0.15 ng/mL

## 2023-01-15 LAB — MAGNESIUM: Magnesium: 2.1 mg/dL (ref 1.7–2.4)

## 2023-01-15 LAB — TSH: TSH: 0.396 u[IU]/mL (ref 0.350–4.500)

## 2023-01-15 LAB — LACTATE DEHYDROGENASE: LDH: 727 U/L — ABNORMAL HIGH (ref 98–192)

## 2023-01-15 LAB — SARS CORONAVIRUS 2 BY RT PCR: SARS Coronavirus 2 by RT PCR: POSITIVE — AB

## 2023-01-15 MED ORDER — SODIUM CHLORIDE 0.9 % IV SOLN
500.0000 mg | INTRAVENOUS | Status: DC
  Administered 2023-01-15 – 2023-01-16 (×2): 500 mg via INTRAVENOUS
  Filled 2023-01-15 (×2): qty 5

## 2023-01-15 MED ORDER — SODIUM CHLORIDE 0.9 % IV SOLN: INTRAVENOUS | Status: DC

## 2023-01-15 MED ORDER — METOCLOPRAMIDE HCL 5 MG/ML IJ SOLN
5.0000 mg | Freq: Four times a day (QID) | INTRAMUSCULAR | Status: DC | PRN
  Administered 2023-01-17: 5 mg via INTRAVENOUS
  Filled 2023-01-15: qty 2

## 2023-01-15 MED ORDER — PANCRELIPASE (LIP-PROT-AMYL) 36000-114000 UNITS PO CPEP
36000.0000 [IU] | ORAL_CAPSULE | ORAL | Status: DC | PRN
  Administered 2023-01-17 – 2023-01-25 (×4): 36000 [IU] via ORAL
  Filled 2023-01-15 (×5): qty 1

## 2023-01-15 MED ORDER — ACETAMINOPHEN 650 MG RE SUPP: 650.0000 mg | Freq: Four times a day (QID) | RECTAL | Status: DC | PRN

## 2023-01-15 MED ORDER — GABAPENTIN 100 MG PO CAPS
200.0000 mg | ORAL_CAPSULE | Freq: Two times a day (BID) | ORAL | Status: DC
  Administered 2023-01-15: 200 mg via ORAL
  Filled 2023-01-15: qty 2

## 2023-01-15 MED ORDER — ONDANSETRON HCL 4 MG PO TABS: 4.0000 mg | ORAL_TABLET | Freq: Four times a day (QID) | ORAL | Status: DC | PRN

## 2023-01-15 MED ORDER — LORAZEPAM 2 MG/ML IJ SOLN: 0.5000 mg | Freq: Four times a day (QID) | INTRAMUSCULAR | Status: DC | PRN

## 2023-01-15 MED ORDER — POTASSIUM CHLORIDE CRYS ER 20 MEQ PO TBCR
40.0000 meq | EXTENDED_RELEASE_TABLET | Freq: Once | ORAL | Status: AC
  Administered 2023-01-16: 40 meq via ORAL
  Filled 2023-01-15: qty 2

## 2023-01-15 MED ORDER — ONDANSETRON HCL 4 MG/2ML IJ SOLN
4.0000 mg | Freq: Once | INTRAMUSCULAR | Status: AC
  Administered 2023-01-15: 4 mg via INTRAVENOUS
  Filled 2023-01-15: qty 2

## 2023-01-15 MED ORDER — MECLIZINE HCL 25 MG PO TABS
50.0000 mg | ORAL_TABLET | Freq: Once | ORAL | Status: AC
  Administered 2023-01-15: 50 mg via ORAL
  Filled 2023-01-15: qty 2

## 2023-01-15 MED ORDER — TRAZODONE HCL 50 MG PO TABS
50.0000 mg | ORAL_TABLET | Freq: Every evening | ORAL | Status: DC | PRN
  Administered 2023-01-15: 50 mg via ORAL
  Filled 2023-01-15: qty 1

## 2023-01-15 MED ORDER — ALBUTEROL SULFATE (2.5 MG/3ML) 0.083% IN NEBU
2.5000 mg | INHALATION_SOLUTION | RESPIRATORY_TRACT | Status: DC | PRN
  Administered 2023-01-23 – 2023-01-24 (×2): 2.5 mg via RESPIRATORY_TRACT
  Filled 2023-01-15 (×2): qty 3

## 2023-01-15 MED ORDER — MONTELUKAST SODIUM 10 MG PO TABS
10.0000 mg | ORAL_TABLET | Freq: Every day | ORAL | Status: DC
  Administered 2023-01-15 – 2023-01-24 (×10): 10 mg via ORAL
  Filled 2023-01-15 (×10): qty 1

## 2023-01-15 MED ORDER — BUPROPION HCL ER (XL) 300 MG PO TB24: 300.0000 mg | ORAL_TABLET | Freq: Every day | ORAL | Status: DC

## 2023-01-15 MED ORDER — GADOBUTROL 1 MMOL/ML IV SOLN
10.0000 mL | Freq: Once | INTRAVENOUS | Status: AC | PRN
  Administered 2023-01-15: 10 mL via INTRAVENOUS

## 2023-01-15 MED ORDER — GUAIFENESIN ER 600 MG PO TB12
600.0000 mg | ORAL_TABLET | Freq: Two times a day (BID) | ORAL | Status: DC
  Administered 2023-01-15 – 2023-01-25 (×20): 600 mg via ORAL
  Filled 2023-01-15 (×20): qty 1

## 2023-01-15 MED ORDER — ONDANSETRON HCL 4 MG/2ML IJ SOLN: 4.0000 mg | Freq: Four times a day (QID) | INTRAMUSCULAR | Status: DC | PRN

## 2023-01-15 MED ORDER — DIAZEPAM 5 MG/ML IJ SOLN
5.0000 mg | Freq: Once | INTRAMUSCULAR | Status: AC
  Administered 2023-01-15: 5 mg via INTRAVENOUS
  Filled 2023-01-15: qty 2

## 2023-01-15 MED ORDER — IOHEXOL 350 MG/ML SOLN
75.0000 mL | Freq: Once | INTRAVENOUS | Status: AC | PRN
  Administered 2023-01-15: 75 mL via INTRAVENOUS

## 2023-01-15 MED ORDER — SODIUM CHLORIDE 0.9 % IV SOLN
2.0000 g | INTRAVENOUS | Status: DC
  Administered 2023-01-15 – 2023-01-18 (×4): 2 g via INTRAVENOUS
  Filled 2023-01-15 (×4): qty 20

## 2023-01-15 MED ORDER — ALBUTEROL SULFATE (2.5 MG/3ML) 0.083% IN NEBU: 2.5000 mg | INHALATION_SOLUTION | RESPIRATORY_TRACT | Status: DC | PRN

## 2023-01-15 MED ORDER — HYDROCODONE-ACETAMINOPHEN 5-325 MG PO TABS
1.0000 | ORAL_TABLET | ORAL | Status: DC | PRN
  Administered 2023-01-15: 1 via ORAL
  Filled 2023-01-15: qty 1

## 2023-01-15 MED ORDER — LACTATED RINGERS IV BOLUS
1000.0000 mL | Freq: Once | INTRAVENOUS | Status: AC
  Administered 2023-01-15: 1000 mL via INTRAVENOUS

## 2023-01-15 MED ORDER — PAROXETINE HCL 20 MG PO TABS
20.0000 mg | ORAL_TABLET | Freq: Every day | ORAL | Status: DC
  Administered 2023-01-16 – 2023-01-25 (×10): 20 mg via ORAL
  Filled 2023-01-15 (×10): qty 1

## 2023-01-15 MED ORDER — ACETAMINOPHEN 325 MG PO TABS: 650.0000 mg | ORAL_TABLET | Freq: Four times a day (QID) | ORAL | Status: DC | PRN

## 2023-01-15 MED ORDER — DEXAMETHASONE SODIUM PHOSPHATE 10 MG/ML IJ SOLN
4.0000 mg | Freq: Once | INTRAMUSCULAR | Status: AC
  Administered 2023-01-15: 4 mg via INTRAVENOUS
  Filled 2023-01-15: qty 1

## 2023-01-15 MED ORDER — IOHEXOL 300 MG/ML  SOLN
100.0000 mL | Freq: Once | INTRAMUSCULAR | Status: AC | PRN
  Administered 2023-01-15: 100 mL via INTRAVENOUS
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
## 2023-01-15 NOTE — Assessment & Plan Note (Addendum)
NPo CT abd pending  9:34 PM CT abd/pelvis non- acute After discussion seems pain is more chronic and superficial Cot supportive managemtn

## 2023-01-15 NOTE — Assessment & Plan Note (Signed)
Possible post covid infection   - -Patient presenting with  productive cough, and infiltrate in   lower lobe on chest x-ray -Infiltrate on CXR and 2-3 characteristics (fever, leukocytosis, purulent sputum) are consistent with pneumonia. -This appears to be most likely community-acquired pneumonia.       will admit for treatment of CAP will start on appropriate antibiotic coverage. - Rocephin/azithromycin   Obtain:  sputum cultures,                                    COVID positive but pt has been symptomatic for a while                  blood cultures and sputum cultures ordered                   strep pneumo UA antigen,                     check for Legionella antigen.                Provide oxygen as needed.

## 2023-01-15 NOTE — ED Notes (Signed)
ED TO INPATIENT HANDOFF REPORT  ED Nurse Name and Phone #: Aveah Castell rn  S Name/Age/Gender [name] 67 y.o. female Room/Bed: WA17/WA17  Code Status   Code Status: Full Code  Home/SNF/Other Home Patient oriented to: self Is this baseline? Yes   Triage Complete: Triage complete  Chief Complaint Dehydration [E86.0]  Triage Note PT BIB by Ems from [facility]. Scheduled for Mri of neck and spine. Got Dizzy while getting IV started. Has been feeling sick past few days. Diagnose with sinus infection on Tuesday, prescribed abx but stop taking it due to having diarrhea. Nausea and weakness started this morning.   BP 170/80 HR 70 Spo2 98%  CBG 133   Allergies Allergies  Allergen Reactions   Carafate [Sucralfate] Hives, Itching and Other (See Comments)    Angioedema      Sulfonamide Derivatives Rash    Syncope    Level of Care/Admitting Diagnosis ED Disposition     ED Disposition  Admit   Condition  --   Comment  Hospital Area: [facility] [[id number]]  Level of Care: Telemetry [5]  Admit to tele based on following criteria: Monitor QTC interval  May place patient in observation at [facility] or [facility] if equivalent level of care is available:: No  Covid Evaluation: Asymptomatic - no recent exposure (last 10 days) testing not required  Diagnosis: Dehydration [276.51.ICD-9-CM]  Admitting Physician: Therisa Doyne [[id number]]  Attending Physician: Therisa Doyne [[id number]]          B Medical/Surgery History Past Medical History:  Diagnosis Date   Anemia    takes Ferrous Sulfate daily   Anxiety    takes Citalopram daily   Arthritis    Asthma    2004-prior to gastric bypass and no problems since   CHF (congestive heart failure) (HCC)    takes Lasix daily as needed   Chronic back pain    spondylolisthesis/stenosis/radiculopathy   Complication of anesthesia yrs ago   slow to wake up   Depression     Diabetes (HCC)    DVT (deep venous thrombosis) (HCC) 01/17/2013   past hx. -tx.5-6 yrs ago bilateral legs, occ. sporadic swelling, has IVC filter implanted   Dysrhythmia    "heart tends to flutter"   Fibromyalgia    Fracture    right foot and is in a cam boot   Gallstones    GERD (gastroesophageal reflux disease)    hx of-no meds now   Heart murmur    History of bronchitis 2012 or 2013   History of colon polyps    Hypertension    takes Metoprolol daily   Insomnia    takes Melatonin daily   Pelvic floor dysfunction    Peripheral neuropathy    Pneumonia 90's   hx of   S/P gastric bypass 2003   Sleep apnea    no cpap used in many yrs after weight lost-no machine now   Urinary frequency    Urinary urgency    Vitamin D deficiency    Past Surgical History:  Procedure Laterality Date   BALLOON DILATION N/A 01/31/2013   Procedure: BALLOON DILATION;  Surgeon: Willis Modena, MD;  Location: [facility];  Service: Endoscopy;  Laterality: N/A;   CARDIAC EVENT MONITOR  03/2019   Predominantly sinus rhythm.  Rates range from 48-124 bpm.  Average 74 bpm.  Frequent short bursts (3-15 beats) PAT/PSVT--not indicated as being symptomatic on diary.  Otherwise rare PACs and PVCs.   CARDIAC  EVENT MONITOR  08/2021   14-Day Zio Patch: Predominantly sinus rhythm with rate range 45 to 135 bpm.  Average 73 bpm.  Occasional (1.3%) PACs with rare PVCs and rare PAC couplets.  24 episodes of atrial runs: Fastest was 11 beats at a rate of 184 bpm, longest was 13 beats with a rate of 92 bpm.  No sustained tachyarrhythmias or bradycardia arrhythmias.  No atrial fibrillation or flutter.   CARDIOPULMONARY EXERCISE STRESS TEST (CPX)  10/27/2021   Good effort despite dyspnea and wheezing.  Modified Naughton protocol on treadmill.  Normal heart rate response.  No sustained arrhythmias.  Preexercise spirometry normal.  Low normal peak VO2. Low normal functional capacity with NO Clear Cardiopulmonary Limitation.   Overall limitation primarily due to obesity & deconditioning, along with a component of Diastolic Dysfunction   CHOLECYSTECTOMY  2007   COLONOSCOPY     CT CTA CORONARY W/CA SCORE W/CM &/OR WO/CM  09/21/2019   Coronary Calcium Score 0.  Normal RCA dominant coronary anatomy.  CAD RADS 1-minimal nonobstructive CAD (0-24%).  Consider nonatherosclerotic cause for chest pain.  Consider preventative therapy and risk factor modification.   DILATION AND CURETTAGE OF UTERUS  yrs ago   ESOPHAGOGASTRODUODENOSCOPY (EGD) WITH PROPOFOL  03/01/2012   Procedure: ESOPHAGOGASTRODUODENOSCOPY (EGD) WITH PROPOFOL;  Surgeon: Willis Modena, MD;  Location: [facility];  Service: Endoscopy;  Laterality: N/A;   ESOPHAGOGASTRODUODENOSCOPY (EGD) WITH PROPOFOL N/A 01/31/2013   Procedure: ESOPHAGOGASTRODUODENOSCOPY (EGD) WITH PROPOFOL;  Surgeon: Willis Modena, MD;  Location: [facility];  Service: Endoscopy;  Laterality: N/A;   ESOPHAGOGASTRODUODENOSCOPY (EGD) WITH PROPOFOL N/A 09/02/2015   Procedure: ESOPHAGOGASTRODUODENOSCOPY (EGD) WITH PROPOFOL;  Surgeon: Iva Boop, MD;  Location: [facility];  Service: Endoscopy;  Laterality: N/A;   GASTRIC BY-PASS  2004   HERNIA REPAIR  2005   INSERTION OF VENA CAVA FILTER  01/17/2013   inserted 2004- "abdomen"   LIGAMENT REPAIR Right 1987   Rt. knee scope   MAXIMUM ACCESS (MAS)POSTERIOR LUMBAR INTERBODY FUSION (PLIF) 2 LEVEL N/A 06/07/2014   Procedure: L4-5 L5-S1 FOR MAXIMUM ACCESS (MAS) POSTERIOR LUMBAR INTERBODY FUSION ;  Surgeon: Maeola Harman, MD;  Location: [facility];  Service: Neurosurgery;  Laterality: N/A;  L4-5 L5-S1 FOR MAXIMUM ACCESS (MAS) POSTERIOR LUMBAR INTERBODY FUSION    TONSILLECTOMY     as child   TRANSTHORACIC ECHOCARDIOGRAM  12/2020   EF 55 to 60%.  No RWMA.  Mild LVH.  GR 1 DD-moderate LA dilation..  Mild MR..  Normal RV size/function.  Normal PAP.  Normal RAP. => Essentially stable from December 2020     A IV Location/Drains/Wounds Patient  Lines/Drains/Airways Status     Active Line/Drains/Airways     Name Placement date Placement time Site Days   Peripheral IV 01/15/23 22 G Distal;Posterior;Right Forearm 01/15/23  --  Forearm  less than 1   Peripheral IV 01/15/23 20 G Left;Posterior Forearm 01/15/23  1342  Forearm  less than 1            Intake/Output Last 24 hours No intake or output data in the 24 hours ending 01/15/23 2005  Labs/Imaging Results for orders placed or performed during the hospital encounter of 01/15/23 (from the past 48 hour(s))  CBC with Differential     Status: Abnormal   Collection Time: 01/15/23  1:15 PM  Result Value Ref Range   WBC 8.2 4.0 - 10.5 K/uL   RBC 4.42 3.87 - 5.11 MIL/uL   Hemoglobin 11.6 (L) 12.0 - 15.0 g/dL  HCT 37.0 36.0 - 46.0 %   MCV 83.7 80.0 - 100.0 fL   MCH 26.2 26.0 - 34.0 pg   MCHC 31.4 30.0 - 36.0 g/dL   RDW 16.1 (H) 09.6 - 04.5 %   Platelets 345 150 - 400 K/uL   nRBC 0.0 0.0 - 0.2 %   Neutrophils Relative % 72 %   Neutro Abs 5.9 1.7 - 7.7 K/uL   Lymphocytes Relative 19 %   Lymphs Abs 1.5 0.7 - 4.0 K/uL   Monocytes Relative 7 %   Monocytes Absolute 0.6 0.1 - 1.0 K/uL   Eosinophils Relative 1 %   Eosinophils Absolute 0.1 0.0 - 0.5 K/uL   Basophils Relative 0 %   Basophils Absolute 0.0 0.0 - 0.1 K/uL   Immature Granulocytes 1 %   Abs Immature Granulocytes 0.04 0.00 - 0.07 K/uL    Comment: Performed at [facility], [address] [place], [place] [address]  Basic metabolic panel     Status: Abnormal   Collection Time: 01/15/23  1:15 PM  Result Value Ref Range   Sodium 141 135 - 145 mmol/L   Potassium 3.5 3.5 - 5.1 mmol/L   Chloride 112 (H) 98 - 111 mmol/L   CO2 21 (L) 22 - 32 mmol/L   Glucose, Bld 116 (H) 70 - 99 mg/dL    Comment: Glucose reference range applies only to samples taken after fasting for at least 8 hours.   BUN 12 8 - 23 mg/dL   Creatinine, Ser 1.91 0.44 - 1.00 mg/dL   Calcium 8.4 (L) 8.9 - 10.3 mg/dL   GFR, Estimated  >47 >82 mL/min    Comment: (NOTE) Calculated using the CKD-EPI Creatinine Equation (2021)    Anion gap 8 5 - 15    Comment: Performed at [facility], [address] [place], [place] [address]  Magnesium     Status: None   Collection Time: 01/15/23  7:23 PM  Result Value Ref Range   Magnesium 2.1 1.7 - 2.4 mg/dL    Comment: Performed at [facility], [address] [place], [place] [address]   *Note: Due to a large number of results and/or encounters for the requested time period, some results have not been displayed. A complete set of results can be found in Results Review.   CT ANGIO HEAD NECK W WO CM  Result Date: 01/15/2023 CLINICAL DATA:  Neuro deficit, acute, stroke suspected. EXAM: CT ANGIOGRAPHY HEAD AND NECK WITH AND WITHOUT CONTRAST TECHNIQUE: Multidetector CT imaging of the head and neck was performed using the standard protocol during bolus administration of intravenous contrast. Multiplanar CT image reconstructions and MIPs were obtained to evaluate the vascular anatomy. Carotid stenosis measurements (when applicable) are obtained utilizing NASCET criteria, using the distal internal carotid diameter as the denominator. RADIATION DOSE REDUCTION: This exam was performed according to the departmental dose-optimization program which includes automated exposure control, adjustment of the mA and/or kV according to patient size and/or use of iterative reconstruction technique. CONTRAST:  75mL OMNIPAQUE IOHEXOL 350 MG/ML SOLN COMPARISON:  MRI same day FINDINGS: CT HEAD FINDINGS The examination suffers from considerable artifactual degradation. As seen, there is no evidence of stroke, mass, hemorrhage, hydrocephalus or extra-axial collection, but detail is quite limited in the examination is not diagnostically sufficient. Thankfully, the angiography portion of the exam is of good quality. CTA NECK FINDINGS Aortic arch: Normal Right carotid system: Common  carotid artery widely patent to the bifurcation. Carotid  bifurcation is normal without soft or calcified plaque. Cervical ICA is normal. Left carotid system: Left carotid system similarly normal. Vertebral arteries: Both vertebral artery origins are widely patent. Both vessels appear normal through the cervical region to the foramen magnum. Skeleton: Chronic degenerative spondylosis. No acute or significant finding. Other neck: No mass or lymphadenopathy. Upper chest: Abnormal patchy density in the left upper lobe that could represent bronchopneumonia. Review of the MIP images confirms the above findings CTA HEAD FINDINGS Anterior circulation: Both internal carotid arteries are patent through the skull base and siphon regions. No siphon stenosis. Anterior and middle cerebral arteries are normal. No large vessel occlusion or proximal stenosis. No aneurysm or vascular malformation. Posterior circulation: Both vertebral arteries widely patent through the foramen magnum to the basilar artery. No basilar stenosis. Posterior circulation branch vessels are normal. Venous sinuses: Patent and normal. Anatomic variants: None significant. Review of the MIP images confirms the above findings IMPRESSION: 1. No large vessel occlusion or proximal stenosis. Normal appearance of the intracranial vessels. 2. No carotid bifurcation disease. 3. Abnormal patchy density in the left upper lobe that could represent bronchopneumonia. 4. The head CT portion of the examination suffers from considerable artifactual degradation of image quality. As seen, there is no evidence of stroke, mass, hemorrhage, hydrocephalus or extra-axial collection, but detail is quite limited in the examination is not diagnostically sufficient. Electronically Signed   By: Paulina Fusi M.D.   On: 01/15/2023 18:07   MR Brain W and Wo Contrast  Result Date: 01/15/2023 CLINICAL DATA:  No deficit, acute, stroke suspected. EXAM: MRI HEAD WITHOUT AND WITH CONTRAST  TECHNIQUE: Multiplanar, multiecho pulse sequences of the brain and surrounding structures were obtained without and with intravenous contrast. CONTRAST:  10mL GADAVIST GADOBUTROL 1 MMOL/ML IV SOLN COMPARISON:  Head CT 06/17/2022.  MRI 06/15/2018 FINDINGS: Brain: Diffusion imaging does not show any acute or subacute infarction. Chronic small-vessel ischemic changes are seen throughout the pons. No focal cerebellar insult. Cerebral hemispheres show chronic small-vessel ischemic changes of the thalami and hemispheric white matter, mildly progressive since 2020. No cortical or large vessel territory infarction. No mass lesion, hemorrhage, hydrocephalus or extra-axial collection. After contrast administration, no abnormal enhancement occurs. Vascular: Major vessels at the base of the brain show flow. Skull and upper cervical spine: Negative Sinuses/Orbits: Clear/normal Other: None IMPRESSION: No acute or reversible finding. Chronic small-vessel ischemic changes of the pons, thalami and hemispheric white matter, mildly progressive since 2020. Electronically Signed   By: Paulina Fusi M.D.   On: 01/15/2023 15:08    Pending Labs Unresulted Labs (From admission, onward)     Start     Ordered   01/16/23 0500  Vitamin B12  (Anemia Panel (PNL))  Tomorrow morning,   R        01/15/23 1933   01/16/23 0500  Folate  (Anemia Panel (PNL))  Tomorrow morning,   R        01/15/23 1933   01/15/23 1947  Lactic acid, plasma  (Lactic Acid)  STAT Now then every 3 hours,   R (with STAT occurrences)      01/15/23 1946   01/15/23 1947  Gastrointestinal Panel by PCR , Stool  (Gastrointestinal Panel by PCR, Stool                                                                                                                                                     **  Does Not include CLOSTRIDIUM DIFFICILE testing. **If CDIFF testing is needed, place order from the "C Difficile Testing" order set.**)  Once,   R        01/15/23 1947    01/15/23 1947  C Difficile Quick Screen w PCR reflex  (C Difficile quick screen w PCR reflex panel )  Once, for 24 hours,   TIMED       References:    CDiff Information Tool   01/15/23 1947   01/15/23 1941  Comprehensive metabolic panel  Once,   R        01/15/23 1940   01/15/23 1935  SARS Coronavirus 2 by RT PCR (hospital order, performed in [facility] hospital lab) *cepheid single result test* Anterior Nasal Swab  (Tier 2 - SARS Coronavirus 2 by RT PCR (hospital order, performed in [facility] hospital lab) *cepheid single result test*)  Once,   R        01/15/23 1934   01/15/23 1934  Iron and TIBC  (Anemia Panel (PNL))  Add-on,   AD        01/15/23 1933   01/15/23 1934  Ferritin  (Anemia Panel (PNL))  Add-on,   AD        01/15/23 1933   01/15/23 1934  Reticulocytes  (Anemia Panel (PNL))  Add-on,   AD        01/15/23 1933   01/15/23 1934  Procalcitonin  Add-on,   AD       References:    Procalcitonin Lower Respiratory Tract Infection AND Sepsis Procalcitonin Algorithm   01/15/23 1933   01/15/23 1934  CK  Add-on,   AD        01/15/23 1933   01/15/23 1934  TSH  Add-on,   AD        01/15/23 1933   Signed and Held  Magnesium  Tomorrow morning,   R        Signed and Held   Signed and Held  Phosphorus  Tomorrow morning,   R        Signed and Held   Signed and Held  Comprehensive metabolic panel  Tomorrow morning,   R       Question:  Release to patient  Answer:  Immediate   Signed and Held   Signed and Held  CBC  Tomorrow morning,   R       Question:  Release to patient  Answer:  Immediate   Signed and Held            Vitals/Pain Today's Vitals   01/15/23 1530 01/15/23 1600 01/15/23 1915 01/15/23 1944  BP: 139/69  104/78 (!) 141/68  Pulse: 66   60  Resp: 16  16 19   Temp:  98.4 F (36.9 C)  97.9 F (36.6 C)  TempSrc:    Oral  SpO2: 94%  94% 96%  Weight:      Height:      PainSc:        Isolation Precautions Enteric precautions (UV  disinfection)  Medications Medications  iohexol (OMNIPAQUE) 300 MG/ML solution 100 mL (has no administration in time range)  diazepam (VALIUM) injection 5 mg (5 mg Intravenous Given 01/15/23 1324)  lactated ringers bolus 1,000 mL (0 mLs Intravenous Stopped 01/15/23 1759)  gadobutrol (GADAVIST) 1 MMOL/ML injection 10 mL (10 mLs Intravenous Contrast Given 01/15/23 1418)  iohexol (OMNIPAQUE) 350 MG/ML injection 75 mL (75 mLs Intravenous Contrast Given 01/15/23 1716)  meclizine (ANTIVERT) tablet 50 mg (  50 mg Oral Given 01/15/23 1759)  ondansetron (ZOFRAN) injection 4 mg (4 mg Intravenous Given 01/15/23 1848)    Mobility walks     Focused Assessments Cardiac Assessment Handoff:    Lab Results  Component Value Date   CKTOTAL 73 09/14/2019   CKMB 2.5 02/20/2011   TROPONINI 0.08 (HH) 12/19/2016   Lab Results  Component Value Date   DDIMER 0.63 (H) 08/12/2020   Does the Patient currently have chest pain? No   , Neuro Assessment Handoff:  Swallow screen pass?    not been ordered pt is NPO         Neuro Assessment:   Neuro Checks:      Has TPA been given? No If patient is a Neuro Trauma and patient is going to OR before floor call report to 4N Charge nurse: [phone] or [phone]   R Recommendations: See Admitting Provider Note  Report given to:   Additional Notes: pt is NPO right now, alert and oriented, room air. Vitals is stable. PT getting CT abdomen prior going up.

## 2023-01-15 NOTE — Plan of Care (Signed)
Acute onset of dizziness since Thursday.   MRI neg for CVA.  CTA pending.  If CTA neg, no further workup needed. Manage vertigo and f/u outpt.   Discussed with EDP.

## 2023-01-15 NOTE — Assessment & Plan Note (Addendum)
Reported head cold last week still has some cough N/V/diarrhea  CT 35.8 Suggest non acute Cover for possible post covid CAP Hold off on antivirals for now

## 2023-01-15 NOTE — Assessment & Plan Note (Signed)
Given recent ABX use and diarrhea will check CDF, given Abd tenderness will obtain CT abd

## 2023-01-15 NOTE — Assessment & Plan Note (Signed)
Nutrition consult as a out pt

## 2023-01-15 NOTE — ED Notes (Signed)
Receiving RN Agreed to transfer pt off the floor once CT done.

## 2023-01-15 NOTE — ED Triage Notes (Signed)
PT BIB by Ems from [facility]. Scheduled for Mri of neck and spine. Got Dizzy while getting IV started. Has been feeling sick past few days. Diagnose with sinus infection on Tuesday, prescribed abx but stop taking it due to having diarrhea. Nausea and weakness started this morning.   BP 170/80 HR 70 Spo2 98%  CBG 133

## 2023-01-15 NOTE — Assessment & Plan Note (Signed)
Cont NEurontin 200 mg po BID

## 2023-01-15 NOTE — Assessment & Plan Note (Signed)
Can be seen in the setting of recent COVID infection and dehydration CK within normal limits will continue to follow-up with Hepatitis serologies CT abdomen unremarkable patient status post cholecystectomy Continue to follow after rehydration if continues to stay up trended from direction will need GI consult Obtain acetaminophen level For completion

## 2023-01-15 NOTE — ED Notes (Signed)
Pt called out stating extreme dizziness. Pt placed back on monitor. HR NSR 66, BP 139/98. No other neuro deficits noted at this time.

## 2023-01-15 NOTE — Assessment & Plan Note (Signed)
Will rehydrate and check orthostatics

## 2023-01-15 NOTE — ED Notes (Signed)
Pt transported to MRI 

## 2023-01-15 NOTE — Assessment & Plan Note (Signed)
Pt deneis

## 2023-01-15 NOTE — Assessment & Plan Note (Signed)
Allow permissive HTN  

## 2023-01-15 NOTE — Assessment & Plan Note (Addendum)
MRI brain and CTA non acute   Symptomatic management Can try Ativan 0.5 mg q 6h if does not resolve Hold off on steroids until CT is back given abd tenderness 9:35 PM CT abd neg Can try a dose of decadron and see if any improvemnt

## 2023-01-15 NOTE — Assessment & Plan Note (Signed)
 Obtain anemia panel  Transfuse for Hg <7 , rapidly dropping or  if symptomatic

## 2023-01-15 NOTE — Assessment & Plan Note (Signed)
denies

## 2023-01-15 NOTE — ED Notes (Signed)
Pt returned from MRI °

## 2023-01-15 NOTE — H&P (Addendum)
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 Authorizing Provider  acetaminophen (TYLENOL) 500 MG tablet Take 2 tablets (1,000 mg total) by mouth every 6 (six) hours as needed for headache or mild pain. 06/19/22   Juliet Rude, PA-C  albuterol (PROVENTIL) (2.5 MG/3ML) 0.083% nebulizer solution Take 3 mLs (2.5 mg total) by nebulization every 6 (six) hours as needed for wheezing or shortness of breath. Patient taking differently: Take 2.5 mg by nebulization as needed for wheezing or shortness of breath. 09/14/21   Margaretann Loveless, PA-C  albuterol (VENTOLIN HFA) 108 (90 Base) MCG/ACT inhaler INHALE 2 PUFFS INTO THE LUNGS EVERY 6 HOURS AS NEEDED FOR WHEEZING OR SHORTNESS OF BREATH Patient taking differently: Inhale 3 puffs into the lungs as  needed for wheezing or shortness of breath. 02/07/20   Myrlene Broker, MD  amLODipine (NORVASC) 10 MG tablet Take 1 tablet (10 mg total) by mouth daily. 12/27/22   Myrlene Broker, MD  Ascorbic Acid (VITAMIN C) 1000 MG tablet Take 1,000 mg by mouth daily. 11/07/10   [provider]  baclofen (LIORESAL) 10 MG tablet Take 1 tablet (10 mg total) by mouth 3 (three) times daily. 06/24/22   Meuth, Brooke A, PA-C  benzonatate (TESSALON) 100 MG capsule Take 1 capsule (100 mg total) by mouth 3 (three) times daily as needed for cough. 01/06/23   Waldon Merl, PA-C  buPROPion (WELLBUTRIN XL) 300 MG 24 hr tablet Take 1 tablet (300 mg total) by mouth daily. 12/27/22   Myrlene Broker, MD  cephALEXin (KEFLEX) 500 MG capsule Take 1 capsule (500 mg total) by mouth 2 (two) times daily. 01/11/23   Myrlene Broker, MD  diclofenac Sodium (VOLTAREN) 1 % GEL Apply 2 g topically 4 (four) times daily. 06/24/22   Meuth, Brooke A, PA-C  ferrous sulfate 325 (65 FE) MG tablet Take 325 mg by mouth once a week.    [provider]  fluticasone (FLONASE) 50 MCG/ACT nasal spray Place 2 sprays into both nostrils as needed for allergies or rhinitis.    [provider]  gabapentin (NEURONTIN) 100 MG capsule Take 2 capsules (200 mg total) by mouth 2 (two) times daily. 06/24/22   Meuth, Brooke A, PA-C  ipratropium (ATROVENT) 0.03 % nasal spray Place 2 sprays into both nostrils every 12 (twelve) hours. X 5-7 days Patient taking differently: Place 2 sprays into both nostrils as needed for rhinitis. 09/14/21   Margaretann Loveless, PA-C  lipase/protease/amylase (CREON) 36000 UNITS CPEP capsule Take 36,000 Units by mouth as needed (stomach issues). 04/28/20   [provider]  loperamide (IMODIUM) 2 MG capsule Take 1 capsule (2 mg total) by mouth as needed for diarrhea or loose stools. 06/24/22   Meuth, Brooke A, PA-C  methocarbamol (ROBAXIN) 500 MG tablet Take 1 tablet (500 mg total) by mouth  every 6 (six) hours as needed for muscle spasms. 06/24/22   Meuth, Brooke A, PA-C  montelukast (SINGULAIR) 10 MG tablet  Take 1 tablet (10 mg total) by mouth at bedtime. 12/27/22   Myrlene Broker, MD  Multiple Vitamin (MULTI-VITAMIN) tablet Take 1 tablet by mouth daily.    [provider]  NEOMYCIN-POLYMYXIN-HYDROCORTISONE (CORTISPORIN) 1 % SOLN OTIC solution INSTILL 3 DROPS TO RIGHT EAR THREE TIMES DAILY 07/22/21   [provider]  ondansetron (ZOFRAN) 4 MG tablet Take 1 tablet (4 mg total) by mouth every 8 (eight) hours as needed for nausea or vomiting. 12/27/22   Myrlene Broker, MD  PARoxetine (PAXIL) 20 MG tablet Take 1 tablet (20 mg total) by mouth daily. 12/27/22   Myrlene Broker, MD  psyllium (HYDROCIL/METAMUCIL) 95 % PACK Take 1 packet by mouth 2 (two) times daily. 06/24/22   Meuth, Brooke A, PA-C  torsemide (DEMADEX) 20 MG tablet TAKE 2 TABLETS BY MOUTH THREE DAYS A WEEK. MAY TAKE AN ADDITIONAL 2 TABLETS AS NEEDED ON THE OTHER DAYS Patient not taking: Reported on 01/11/2023 09/30/22   Marykay Lex, MD  traMADol (ULTRAM) 50 MG tablet Take 2 tablets (100 mg total) by mouth 2 (two) times daily. 08/17/22   Myrlene Broker, MD  traZODone (DESYREL) 50 MG tablet Take 1 tablet (50 mg total) by mouth at bedtime as needed for sleep. 08/17/22   Myrlene Broker, MD  VITAMIN D PO Take 1 capsule by mouth daily.    [provider]  Vitamin D, Ergocalciferol, (DRISDOL) 1.25 MG (50000 UNIT) CAPS capsule TAKE 1 CAPSULE BY MOUTH EVERY 7 DAYS Patient taking differently: Take 50,000 Units by mouth once a week. 12/22/21   Myrlene Broker, MD    ___________________________________________________________________________________________________ Physical Exam:    01/15/2023    3:30 PM 01/15/2023   12:49 PM 01/15/2023   12:43 PM  Vitals with BMI  Height   5\' 5"   Weight   247 lbs  BMI   41.1  Systolic 139 137   Diastolic 69 76   Pulse 66 65      1.  General:  in No  Acute distress      Chronically ill   -appearing 2. Psychological: Alert and   Oriented 3. Head/ENT: Dry Mucous Membranes                          Head Non traumatic, neck supple                           Poor Dentition 4. SKIN:  decreased Skin turgor,  Skin clean Dry and intact no rash    5. Heart: Regular rate and rhythm systolic  Murmur, no Rub or gallop 6. Lungs:  Clear to auscultation bilaterally, no wheezes or crackles   7. Abdomen: Soft, diffusely-tender lower abd , Non distended   obese  bowel sounds present 8. Lower extremities: no clubbing, cyanosis, no  edema 9. Neurologically Grossly intact, moving all 4 extremities equally   10. MSK: Normal range of motion    Chart has been reviewed  ______________________________________________________________________________________________  Assessment/Plan   67 y.o. female with medical history significant of Exocrine pancreatic insufficiency , frequent UTI, hx of C.dif, HTN, heart murmur, diastolic CHF, gastric bypass   Admitted for  intractable vertigo and dehydration   Present on Admission:  Dehydration  Morbid obesity (HCC)  Essential hypertension  OBSTRUCTIVE SLEEP APNEA  Recurrent Clostridioides difficile infection  Anemia  Abdominal tenderness  COVID-19 virus infection  CAP (community acquired pneumonia)  Transaminitis    Dehydration Will rehydrate and check orthostatics  Morbid obesity (HCC) Nutrition consult as a out pt  Essential hypertension Allow permissive HTN  OBSTRUCTIVE SLEEP APNEA Pt deneis  Peripheral neuropathy Cont NEurontin 200 mg po BID  Recurrent Clostridioides difficile infection Given recent ABX use and diarrhea will check CDF, given Abd tenderness will obtain CT abd  Anemia Obtain anemia panel  Transfuse for Hg <7 , rapidly dropping or  if symptomatic   Vertigo MRI brain and CTA non acute   Symptomatic management Can try Ativan 0.5 mg q 6h if does not  resolve Hold off on steroids until CT is back given abd tenderness 9:35 PM CT abd neg Can try a dose of decadron and see if any improvemnt  Abdominal tenderness NPo CT abd pending  9:34 PM CT abd/pelvis non- acute After discussion seems pain is more chronic and superficial Cot supportive managemtn  COVID-19 virus infection Reported head cold last week still has some cough N/V/diarrhea  CT 35.8 Suggest non acute Cover for possible post covid CAP Hold off on antivirals for now  Hx of diabetes mellitus denies  CAP (community acquired pneumonia) Possible post covid infection   - -Patient presenting with  productive cough, and infiltrate in   lower lobe on chest x-ray -Infiltrate on CXR and 2-3 characteristics (fever, leukocytosis, purulent sputum) are consistent with pneumonia. -This appears to be most likely community-acquired pneumonia.       will admit for treatment of CAP will start on appropriate antibiotic coverage. - Rocephin/azithromycin   Obtain:  sputum cultures,                                    COVID positive but pt has been symptomatic for a while                  blood cultures and sputum cultures ordered                   strep pneumo UA antigen,                     check for Legionella antigen.                Provide oxygen as needed.    Transaminitis Can be seen in the setting of recent COVID infection and dehydration CK within normal limits will continue to follow-up with Hepatitis serologies CT abdomen unremarkable patient status post cholecystectomy Continue to follow after rehydration if continues to stay up trended from direction will need GI consult Obtain acetaminophen level For completion    Other plan as per orders.  DVT prophylaxis:  SCD    Code Status:    Code Status: Prior FULL CODE as per patient   I had personally discussed CODE STATUS with patient  ACP   none    Family Communication:   Family not at  Bedside    Diet NPO until  CT results   Disposition Plan:         To home once workup is complete and patient is stable   Following barriers for discharge:                            ABD pain  work up is complete Pt able to ambulate  Electrolytes corrected                                       Consult Orders  (From admission, onward)           Start     Ordered   01/15/23 1843  Consult to hospitalist  Once       Provider:  (Not yet assigned)  Question Answer Comment  Place call to: [facility]   Reason for Consult Consult      01/15/23 1843                               Would benefit from PT/OT eval prior to DC  Ordered                    Consults called: case was discussed by ER with Neurology  for recommendations  Admission status:  ED Disposition     ED Disposition  Admit   Condition  --   Comment  Hospital Area: [facility] [[id number]]  Level of Care: Telemetry [5]  Admit to tele based on following criteria: Monitor QTC interval  May place patient in observation at [facility] or [facility] if equivalent level of care is available:: No  Covid Evaluation: Asymptomatic - no recent exposure (last 10 days) testing not required  Diagnosis: Dehydration [276.51.ICD-9-CM]  Admitting Physician: Therisa Doyne [[id number]]  Attending Physician: Therisa Doyne [[id number]]          Obs      Level of care     tele  For  24H       Lenton Gendreau 01/15/2023, 9:37 PM    [facility]     after 2 AM please page floor coverage PA If 7AM-7PM, please contact the day team taking care of the patient using [email or web address]

## 2023-01-16 DIAGNOSIS — I639 Cerebral infarction, unspecified: Secondary | ICD-10-CM | POA: Diagnosis not present

## 2023-01-16 DIAGNOSIS — R10827 Generalized rebound abdominal tenderness: Secondary | ICD-10-CM | POA: Diagnosis not present

## 2023-01-16 DIAGNOSIS — Z6841 Body Mass Index (BMI) 40.0 and over, adult: Secondary | ICD-10-CM | POA: Diagnosis not present

## 2023-01-16 DIAGNOSIS — J45909 Unspecified asthma, uncomplicated: Secondary | ICD-10-CM | POA: Diagnosis present

## 2023-01-16 DIAGNOSIS — Z8639 Personal history of other endocrine, nutritional and metabolic disease: Secondary | ICD-10-CM | POA: Diagnosis not present

## 2023-01-16 DIAGNOSIS — R29818 Other symptoms and signs involving the nervous system: Secondary | ICD-10-CM | POA: Diagnosis not present

## 2023-01-16 DIAGNOSIS — G8929 Other chronic pain: Secondary | ICD-10-CM | POA: Diagnosis present

## 2023-01-16 DIAGNOSIS — R109 Unspecified abdominal pain: Secondary | ICD-10-CM | POA: Diagnosis present

## 2023-01-16 DIAGNOSIS — T391X1A Poisoning by 4-Aminophenol derivatives, accidental (unintentional), initial encounter: Secondary | ICD-10-CM | POA: Diagnosis not present

## 2023-01-16 DIAGNOSIS — Z95828 Presence of other vascular implants and grafts: Secondary | ICD-10-CM | POA: Diagnosis not present

## 2023-01-16 DIAGNOSIS — J189 Pneumonia, unspecified organism: Secondary | ICD-10-CM

## 2023-01-16 DIAGNOSIS — D509 Iron deficiency anemia, unspecified: Secondary | ICD-10-CM | POA: Diagnosis present

## 2023-01-16 DIAGNOSIS — D649 Anemia, unspecified: Secondary | ICD-10-CM | POA: Diagnosis not present

## 2023-01-16 DIAGNOSIS — G4733 Obstructive sleep apnea (adult) (pediatric): Secondary | ICD-10-CM | POA: Diagnosis present

## 2023-01-16 DIAGNOSIS — H53461 Homonymous bilateral field defects, right side: Secondary | ICD-10-CM | POA: Diagnosis present

## 2023-01-16 DIAGNOSIS — M797 Fibromyalgia: Secondary | ICD-10-CM | POA: Diagnosis present

## 2023-01-16 DIAGNOSIS — I6782 Cerebral ischemia: Secondary | ICD-10-CM | POA: Diagnosis not present

## 2023-01-16 DIAGNOSIS — G8191 Hemiplegia, unspecified affecting right dominant side: Secondary | ICD-10-CM | POA: Diagnosis not present

## 2023-01-16 DIAGNOSIS — I6523 Occlusion and stenosis of bilateral carotid arteries: Secondary | ICD-10-CM | POA: Diagnosis not present

## 2023-01-16 DIAGNOSIS — A498 Other bacterial infections of unspecified site: Secondary | ICD-10-CM | POA: Diagnosis not present

## 2023-01-16 DIAGNOSIS — E876 Hypokalemia: Secondary | ICD-10-CM | POA: Diagnosis present

## 2023-01-16 DIAGNOSIS — R7989 Other specified abnormal findings of blood chemistry: Secondary | ICD-10-CM | POA: Diagnosis not present

## 2023-01-16 DIAGNOSIS — R4182 Altered mental status, unspecified: Secondary | ICD-10-CM | POA: Diagnosis not present

## 2023-01-16 DIAGNOSIS — U071 COVID-19: Secondary | ICD-10-CM | POA: Diagnosis present

## 2023-01-16 DIAGNOSIS — F449 Dissociative and conversion disorder, unspecified: Secondary | ICD-10-CM | POA: Diagnosis not present

## 2023-01-16 DIAGNOSIS — K8681 Exocrine pancreatic insufficiency: Secondary | ICD-10-CM | POA: Diagnosis present

## 2023-01-16 DIAGNOSIS — R7401 Elevation of levels of liver transaminase levels: Secondary | ICD-10-CM

## 2023-01-16 DIAGNOSIS — R29810 Facial weakness: Secondary | ICD-10-CM | POA: Diagnosis not present

## 2023-01-16 DIAGNOSIS — G609 Hereditary and idiopathic neuropathy, unspecified: Secondary | ICD-10-CM | POA: Diagnosis not present

## 2023-01-16 DIAGNOSIS — E1140 Type 2 diabetes mellitus with diabetic neuropathy, unspecified: Secondary | ICD-10-CM | POA: Diagnosis present

## 2023-01-16 DIAGNOSIS — F32A Depression, unspecified: Secondary | ICD-10-CM | POA: Diagnosis present

## 2023-01-16 DIAGNOSIS — I951 Orthostatic hypotension: Secondary | ICD-10-CM | POA: Diagnosis present

## 2023-01-16 DIAGNOSIS — I700 Atherosclerosis of aorta: Secondary | ICD-10-CM | POA: Diagnosis not present

## 2023-01-16 DIAGNOSIS — G459 Transient cerebral ischemic attack, unspecified: Secondary | ICD-10-CM | POA: Diagnosis not present

## 2023-01-16 DIAGNOSIS — E860 Dehydration: Secondary | ICD-10-CM | POA: Diagnosis present

## 2023-01-16 DIAGNOSIS — R569 Unspecified convulsions: Secondary | ICD-10-CM | POA: Diagnosis not present

## 2023-01-16 DIAGNOSIS — I5032 Chronic diastolic (congestive) heart failure: Secondary | ICD-10-CM | POA: Diagnosis present

## 2023-01-16 DIAGNOSIS — R4701 Aphasia: Secondary | ICD-10-CM | POA: Diagnosis not present

## 2023-01-16 DIAGNOSIS — I10 Essential (primary) hypertension: Secondary | ICD-10-CM | POA: Diagnosis not present

## 2023-01-16 DIAGNOSIS — I110 Hypertensive heart disease with heart failure: Secondary | ICD-10-CM | POA: Diagnosis present

## 2023-01-16 LAB — CBC
HCT: 38.5 % (ref 36.0–46.0)
Hemoglobin: 11.8 g/dL — ABNORMAL LOW (ref 12.0–15.0)
MCH: 26.1 pg (ref 26.0–34.0)
MCHC: 30.6 g/dL (ref 30.0–36.0)
MCV: 85.2 fL (ref 80.0–100.0)
Platelets: 347 10*3/uL (ref 150–400)
RBC: 4.52 MIL/uL (ref 3.87–5.11)
RDW: 17.1 % — ABNORMAL HIGH (ref 11.5–15.5)
WBC: 2.4 10*3/uL — ABNORMAL LOW (ref 4.0–10.5)
nRBC: 0 % (ref 0.0–0.2)

## 2023-01-16 LAB — PROTIME-INR
INR: 1.1 (ref 0.8–1.2)
Prothrombin Time: 14.6 s (ref 11.4–15.2)

## 2023-01-16 LAB — COMPREHENSIVE METABOLIC PANEL
ALT: 276 U/L — ABNORMAL HIGH (ref 0–44)
AST: 416 U/L — ABNORMAL HIGH (ref 15–41)
Albumin: 3.1 g/dL — ABNORMAL LOW (ref 3.5–5.0)
Alkaline Phosphatase: 208 U/L — ABNORMAL HIGH (ref 38–126)
Anion gap: 7 (ref 5–15)
BUN: 9 mg/dL (ref 8–23)
CO2: 23 mmol/L (ref 22–32)
Calcium: 8.5 mg/dL — ABNORMAL LOW (ref 8.9–10.3)
Chloride: 113 mmol/L — ABNORMAL HIGH (ref 98–111)
Creatinine, Ser: 0.75 mg/dL (ref 0.44–1.00)
GFR, Estimated: >60 mL/min (ref >60)
Glucose, Bld: 203 mg/dL — ABNORMAL HIGH (ref 70–99)
Potassium: 4.2 mmol/L (ref 3.5–5.1)
Sodium: 143 mmol/L (ref 135–145)
Total Bilirubin: 0.9 mg/dL (ref 0.3–1.2)
Total Protein: 6.8 g/dL (ref 6.5–8.1)

## 2023-01-16 LAB — ACETAMINOPHEN LEVEL: Acetaminophen (Tylenol), Serum: 10 ug/mL (ref 10–30)

## 2023-01-16 LAB — VITAMIN B12: Vitamin B-12: 4853 pg/mL — ABNORMAL HIGH (ref 180–914)

## 2023-01-16 LAB — HEPATITIS PANEL, ACUTE
HCV Ab: NONREACTIVE
Hep A IgM: NONREACTIVE
Hep B C IgM: NONREACTIVE
Hepatitis B Surface Ag: NONREACTIVE

## 2023-01-16 LAB — PHOSPHORUS: Phosphorus: 2.9 mg/dL (ref 2.5–4.6)

## 2023-01-16 LAB — LIPASE, BLOOD: Lipase: 31 U/L (ref 11–51)

## 2023-01-16 LAB — GLUCOSE, CAPILLARY: Glucose-Capillary: 134 mg/dL — ABNORMAL HIGH (ref 70–99)

## 2023-01-16 LAB — AMMONIA: Ammonia: 52 umol/L — ABNORMAL HIGH (ref 9–35)

## 2023-01-16 LAB — C-REACTIVE PROTEIN: CRP: <0.5 mg/dL (ref <1.0)

## 2023-01-16 LAB — FOLATE: Folate: 15.2 ng/mL (ref >5.9)

## 2023-01-16 LAB — PREALBUMIN: Prealbumin: 17 mg/dL — ABNORMAL LOW (ref 18–38)

## 2023-01-16 LAB — MAGNESIUM: Magnesium: 2.1 mg/dL (ref 1.7–2.4)

## 2023-01-16 LAB — HIV ANTIBODY (ROUTINE TESTING W REFLEX): HIV Screen 4th Generation wRfx: NONREACTIVE

## 2023-01-16 LAB — STREP PNEUMONIAE URINARY ANTIGEN: Strep Pneumo Urinary Antigen: NEGATIVE

## 2023-01-16 LAB — LACTIC ACID, PLASMA: Lactic Acid, Venous: 1.5 mmol/L (ref 0.5–1.9)

## 2023-01-16 MED ORDER — SODIUM CHLORIDE 0.45 % IV SOLN: INTRAVENOUS | Status: AC

## 2023-01-16 MED ORDER — SODIUM CHLORIDE 0.9 % IV SOLN: INTRAVENOUS | Status: DC

## 2023-01-16 MED ORDER — POTASSIUM CHLORIDE CRYS ER 20 MEQ PO TBCR
20.0000 meq | EXTENDED_RELEASE_TABLET | Freq: Two times a day (BID) | ORAL | Status: DC
  Administered 2023-01-16 – 2023-01-18 (×6): 20 meq via ORAL
  Filled 2023-01-16 (×6): qty 1

## 2023-01-16 MED ORDER — GABAPENTIN 100 MG PO CAPS: 100.0000 mg | ORAL_CAPSULE | Freq: Two times a day (BID) | ORAL | Status: DC

## 2023-01-16 NOTE — Evaluation (Addendum)
Physical Therapy Evaluation Patient Details Name: [name] MRN: [id number] DOB: [birth year] Today's Date: 01/16/2023  History of Present Illness  Pt is a 67 year old female who presented with vertigo started 3 days prior to admission with nausea and vomiting and admitted 01/15/23 for Diarrhea/abdominal tenderness/elevated LFTs.  Also found to be Covid positive.  PMH anemia, anxiety, OA, asthma, CHF, chronic back pain, DM, hx DVT s/p IVC filter, dysrhythmia, fibro, peripheral neuropathy, hernia repair, R knee ligament repair, lumbar fusion, exocrine pancreatic insufficiency, frequent UTI, C. difficile, essential hypertension, chronic diastolic heart failure, gastric bypass  Clinical Impression  Pt admitted with above diagnosis.  Pt currently with functional limitations due to the deficits listed below (see PT Problem List). Pt will benefit from acute skilled PT to increase their independence and safety with mobility to allow discharge.  Pt talking and following directions on arrival to room (able to sit EOB).  Pt became very dizzy and reported spinning upon standing.  Pt also with decreased responsiveness and cognition with return to sitting so also assisted pt safely back to supine and called RN into room.  Pt from home alone and uses RW for mobility.  Patient will benefit from continued inpatient follow up therapy, <3 hours/day.   01/16/23 1303  Vital Signs  Temp (!) 97.4 F (36.3 C)  Temp Source Axillary  Pulse Rate 62  Pulse Rate Source Monitor  Resp 18  BP (!) 148/63  BP Location Right Arm  BP Method Automatic  Patient Position (if appropriate) Orthostatic Vitals  Orthostatic Lying   BP- Lying 142/83  Pulse- Lying 62  Orthostatic Sitting  BP- Sitting (!) 149/96  Pulse- Sitting 60  Orthostatic Standing at 0 minutes  BP- Standing at 0 minutes (!) 160/98  Pulse- Standing at 0 minutes 95 (unable to stand longer for 3 min trial)  Oxygen Therapy  SpO2 99 %  O2 Device Room Air             If plan is discharge home, recommend the following: Two people to help with walking and/or transfers;Two people to help with bathing/dressing/bathroom;Assistance with cooking/housework;Help with stairs or ramp for entrance   Can travel by private vehicle   No    Equipment Recommendations None recommended by PT  Recommendations for Other Services       Functional Status Assessment Patient has had a recent decline in their functional status and demonstrates the ability to make significant improvements in function in a reasonable and predictable amount of time.     Precautions / Restrictions Precautions Precautions: Fall      Mobility  Bed Mobility Overal bed mobility: Needs Assistance Bed Mobility: Supine to Sit, Sit to Supine     Supine to sit: Contact guard, Used rails, HOB elevated Sit to supine: Mod assist, Max assist, +2 for safety/equipment   General bed mobility comments: more assist to return to bed due to cognition    Transfers Overall transfer level: Needs assistance Equipment used: Rolling walker (2 wheels) Transfers: Sit to/from Stand Sit to Stand: Mod assist, +2 safety/equipment, +2 physical assistance           General transfer comment: assist to rise and steady; pt reports room spinning and difficulty keeping eyes open, able to open BP reading and pt returned to sitting; pt's cognition then decreased so pt assisted safely back to supine and RN called into room    Ambulation/Gait  Stairs            Wheelchair Mobility     Tilt Bed    Modified Rankin (Stroke Patients Only)       Balance Overall balance assessment: Needs assistance         Standing balance support: Reliant on assistive device for balance, Bilateral upper extremity supported, During functional activity Standing balance-Leahy Scale: Poor                               Pertinent Vitals/Pain Pain Assessment Pain  Assessment: Faces Faces Pain Scale: Hurts whole lot Pain Location: head Pain Descriptors / Indicators: Headache, Grimacing Pain Intervention(s): Monitored during session, Other (comment) (RN in room and aware)    Home Living Family/patient expects to be discharged to:: Private residence Living Arrangements: Alone Available Help at Discharge: Family;Available PRN/intermittently Type of Home: House Home Access: Ramped entrance       Home Layout: One level Home Equipment: Rollator (4 wheels);Cane - single point;Shower seat - built in;Cane - quad      Prior Function Prior Level of Function : Independent/Modified Independent;History of Falls (last six months)             Mobility Comments: uses RW in the house and cane in community.       Extremity/Trunk Assessment        Lower Extremity Assessment Lower Extremity Assessment: Generalized weakness       Communication      Cognition Arousal: Stuporous   Overall Cognitive Status: No family/caregiver present to determine baseline cognitive functioning                                 General Comments: alert but became stuporous during session after standing - RN called into room to assess, pt only able to state her first name        General Comments      Exercises     Assessment/Plan    PT Assessment Patient needs continued PT services  PT Problem List Decreased strength;Decreased balance;Decreased mobility;Decreased activity tolerance;Decreased knowledge of use of DME;Decreased safety awareness;Decreased cognition       PT Treatment Interventions DME instruction;Gait training;Balance training;Functional mobility training;Therapeutic activities;Therapeutic exercise;Patient/family education    PT Goals (Current goals can be found in the Care Plan section)  Acute Rehab PT Goals PT Goal Formulation: Patient unable to participate in goal setting Time For Goal Achievement: 01/30/23 Potential to  Achieve Goals: Good    Frequency Min 1X/week     Co-evaluation PT/OT/SLP Co-Evaluation/Treatment: Yes Reason for Co-Treatment: To address functional/ADL transfers PT goals addressed during session: Mobility/safety with mobility OT goals addressed during session: ADL's and self-care       AM-PAC PT "6 Clicks" Mobility  Outcome Measure Help needed turning from your back to your side while in a flat bed without using bedrails?: A Lot Help needed moving from lying on your back to sitting on the side of a flat bed without using bedrails?: A Lot Help needed moving to and from a bed to a chair (including a wheelchair)?: A Lot Help needed standing up from a chair using your arms (e.g., wheelchair or bedside chair)?: Total Help needed to walk in hospital room?: Total Help needed climbing 3-5 steps with a railing? : Total 6 Click Score: 9    End of Session Equipment Utilized During Treatment:  Gait belt Activity Tolerance: Treatment limited secondary to medical complications (Comment) Patient left: in bed;with call bell/phone within reach;with nursing/sitter in room (RN in room) Nurse Communication: Mobility status PT Visit Diagnosis: Difficulty in walking, not elsewhere classified (R26.2);Muscle weakness (generalized) (M62.81)    Time: 1610-9604 PT Time Calculation (min) (ACUTE ONLY): 24 min   Charges:   PT Evaluation $PT Eval Low Complexity: 1 Low   PT General Charges $$ ACUTE PT VISIT: 1 Visit       Thomasene Mohair PT, DPT Physical Therapist Acute Rehabilitation Services Office: [phone]   Kati L Payson 01/16/2023, 3:12 PM

## 2023-01-16 NOTE — Progress Notes (Signed)
[name] epic chatted and made aware. IV access lost IV team request placed

## 2023-01-16 NOTE — Progress Notes (Signed)
This RN notified by NT that pt seemed off during vital sign check around 0325. This RN and RN Calvin into room to assess pt. Pt appeared very drowsy and with slow/delayed responsiveness. Pt A&Ox4, though with a soft spoken voice and delayed responses. Pt able to lift arms/legs and follow commands, though weak. Pt with complaints of dizziness and headache, no pain. Vital signs and blood glucose checked, both WDL. Pt on tele, SR/SB with HR in 50s-60s. Pt into ED earlier with vertigo/dizziness x3 days. CTA Head and MRI Brain obtained in ED, both without acute abnormalities. Rapid Response RN Genell notified of situation and asked to come assess pt. Genell, RN up to bedside to assess pt. Pt neurologically intact, though weak during neuro assessment. Rapid RN Genell spoke with overnight provider about situation and thought to be medication related. Pt received Norco around 2130 and trazodone 50mg  along with gabapentin 200mg  around 2215. Pt states that she has not been taking trazodone much at home. Both gabapentin and trazodone discontinued by overnight provider Manuela Schwartz, NP. No further orders placed at this time. No need for transfer or higher level of care at this time. Will continue to closely monitor

## 2023-01-16 NOTE — Plan of Care (Signed)
  Problem: Education: Goal: Knowledge of General Education information will improve Description: Including pain rating scale, medication(s)/side effects and non-pharmacologic comfort measures Outcome: Progressing   Problem: Clinical Measurements: Goal: Ability to maintain clinical measurements within normal limits will improve Outcome: Progressing   Problem: Nutrition: Goal: Adequate nutrition will be maintained Outcome: Progressing   Problem: Pain Managment: Goal: General experience of comfort will improve Outcome: Progressing   Problem: Safety: Goal: Ability to remain free from injury will improve Outcome: Progressing   Problem: Skin Integrity: Goal: Risk for impaired skin integrity will decrease Outcome: Progressing   

## 2023-01-16 NOTE — Evaluation (Signed)
Occupational Therapy Evaluation Patient Details Name: [name] MRN: [id number] DOB: May 21, 1955 Today's Date: 01/16/2023   History of Present Illness Pt is a 67 year old female who presented with vertigo started 3 days prior to admission with nausea and vomiting and admitted 01/15/23 for Diarrhea/abdominal tenderness/elevated LFTs.  Also found to be Covid positive.  PMH anemia, anxiety, OA, asthma, CHF, chronic back pain, DM, hx DVT s/p IVC filter, dysrhythmia, fibro, peripheral neuropathy, hernia repair, R knee ligament repair, lumbar fusion, exocrine pancreatic insufficiency, frequent UTI, C. difficile, essential hypertension, chronic diastolic heart failure, gastric bypass   Clinical Impression   Patient is a 67 year old female who was admitted for above. Patient was living at home alone with independence in ADLs prior level. Currently, patient was very limited with increased dizziness with standing with patient having decreased responsiveness and cognition changes with return to sitting. Nurse called into room when patients status changed. Patient asked multiple times " are we going to get up?" when she came around out of this apparent episode with no memory of attempting to stand with therapists.  Patients nurse called into room and was with patient when therapy session ended. Patient will benefit from continued inpatient follow up therapy, <3 hours/day   Blood pressure:  Supine: 142/83 mmhg pulse rate 62 bpm Sitting: 149/96 mmhg pulse rate 60 bpm  Standing: 160/98 mmhg pulse rate 95 bpm. Patient was unable to remain standing with change in status for second BP to be taken.       If plan is discharge home, recommend the following: Two people to help with bathing/dressing/bathroom;Two people to help with walking and/or transfers;Direct supervision/assist for financial management;Assist for transportation;Direct supervision/assist for medications management;Help with stairs or ramp for  entrance;Supervision due to cognitive status    Functional Status Assessment  Patient has had a recent decline in their functional status and demonstrates the ability to make significant improvements in function in a reasonable and predictable amount of time.  Equipment Recommendations  None recommended by OT       Precautions / Restrictions Precautions Precautions: Fall Restrictions Weight Bearing Restrictions: No      Mobility Bed Mobility Overal bed mobility: Needs Assistance Bed Mobility: Supine to Sit, Sit to Supine     Supine to sit: Contact guard, Used rails, HOB elevated Sit to supine: Mod assist, Max assist, +2 for safety/equipment   General bed mobility comments: more assist to return to bed due to change in status.    Transfers Overall transfer level: Needs assistance Equipment used: Rolling walker (2 wheels) Transfers: Sit to/from Stand Sit to Stand: Mod assist, +2 safety/equipment, +2 physical assistance           General transfer comment: assist to rise and steady; pt reports room spinning and difficulty keeping eyes open, able to open BP reading and pt returned to sitting; pt's cognition then decreased so pt assisted safely back to supine and RN called into room      Balance Overall balance assessment: Needs assistance         Standing balance support: Reliant on assistive device for balance, Bilateral upper extremity supported, During functional activity Standing balance-Leahy Scale: Poor       ADL either performed or assessed with clinical judgement   ADL Overall ADL's : Needs assistance/impaired Eating/Feeding: Set up   Grooming: Minimal assistance;Bed level   Upper Body Bathing: Bed level;Maximal assistance   Lower Body Bathing: Bed level;Maximal assistance   Upper Body Dressing :  Bed level;Maximal assistance   Lower Body Dressing: Bed level;Maximal assistance     Toilet Transfer Details (indicate cue type and reason): patient  reported feeling dizziness with sitting EOB. patient during standing noted to have change in status with patient appearing to have had syncopal like episode that only resolved with increased time supine in bed. patient while sitting after standing reporting increased pressure in head with patient stairing into space and then keeping eyes closed with increased cues to keep eyes open. patient reported he was at school at this time and had a hard time stating her name. patient's nurse called into room. patient returned to supine with patient reporting she was at home and only able to state her first name when asked multiple times for whole name. patient's BPs as noted in chart above. patient +2 for getting back into bed and repositioning. when patient begain to come around she asked multiple times if we were going to try to get up as if patient did not remember the attempt in standing that had just happened. nurse in room with patient at end of session. Toileting- Clothing Manipulation and Hygiene: Total assistance;Bed level               Vision Baseline Vision/History: 1 Wears glasses Additional Comments: difficult to assess with onset of stuporous event            Pertinent Vitals/Pain Pain Assessment Pain Assessment: Faces Faces Pain Scale: Hurts whole lot Pain Location: head Pain Descriptors / Indicators: Headache, Grimacing Pain Intervention(s): Monitored during session     Extremity/Trunk Assessment Upper Extremity Assessment Upper Extremity Assessment: Difficult to assess due to impaired cognition   Lower Extremity Assessment Lower Extremity Assessment: Defer to PT evaluation   Cervical / Trunk Assessment Cervical / Trunk Assessment: Normal   Communication Communication Communication: No apparent difficulties   Cognition Arousal: Stuporous   Overall Cognitive Status: No family/caregiver present to determine baseline cognitive functioning       General Comments: alert  but became stuporous during session after standing - RN called into room to assess, pt only able to state her first name and reported she was at school, then returned to be reporting she was at home. patient appeared to have startled reponse with therapist and nursing calling her name during obtunded state.                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          OT Problem List: Decreased strength;Decreased coordination;Decreased cognition;Decreased activity tolerance;Decreased safety awareness;Cardiopulmonary status limiting activity;Impaired  UE functional use;Decreased knowledge of precautions;Impaired balance (sitting and/or standing);Decreased knowledge of use of DME or AE      OT Treatment/Interventions: Self-care/ADL training;Therapeutic exercise;Therapeutic activities;Cognitive remediation/compensation;DME and/or AE instruction;Patient/family education;Balance training    OT Goals(Current goals can be found in the care plan section) Acute Rehab OT Goals Patient Stated Goal: none  stated OT Goal Formulation: Patient unable to participate in goal setting Time For Goal Achievement: 01/30/23 Potential to Achieve Goals: Fair  OT Frequency: Min 1X/week    Co-evaluation PT/OT/SLP Co-Evaluation/Treatment: Yes Reason for Co-Treatment: To address functional/ADL transfers PT goals addressed during session: Mobility/safety with mobility OT goals addressed during session: ADL's and self-care      AM-PAC OT "6 Clicks" Daily Activity     Outcome Measure Help from another person eating meals?: A Little Help from another person taking care of personal grooming?: Total Help from another person toileting, which includes using toliet, bedpan, or urinal?: Total Help from another person bathing (including washing, rinsing, drying)?: Total Help from another person to put on and taking off regular upper body clothing?: Total Help from another person to put on and taking off regular lower body clothing?: Total 6 Click Score: 8   End of Session Equipment Utilized During Treatment: Gait belt;Rolling walker (2 wheels) Nurse Communication: Other (comment) (nurse called into room when patients status changed.)  Activity Tolerance: Treatment limited secondary to medical complications (Comment) Patient left: in bed;with call bell/phone within reach;with bed alarm set;with nursing/sitter in room (nurse in room)  OT Visit Diagnosis: Unsteadiness on feet (R26.81);Other abnormalities of gait and mobility (R26.89);History of falling (Z91.81)                Time: 6295-2841 OT Time Calculation (min): 22 min Charges:  OT General Charges $OT Visit: 1 Visit OT Evaluation $OT Eval Moderate Complexity: 1 Mod  Shalaina Guardiola OTR/L, MS Acute Rehabilitation Department Office# [phone]   Selinda Flavin 01/16/2023, 3:46 PM

## 2023-01-16 NOTE — Progress Notes (Addendum)
CROSS COVER NOTE  NAME: [name] MRN: [id number] DOB : Sep 05, 1955    Concern as stated by nurse / staff   Paged with rreports patient sluggish to responsd, oriented, generalized weakness, no focal deficits  C CT and MRI earlier this day negative for acute CVA     Pertinent findings on chart review: Patient to ER for severe vertigo and found to have COVID and suspected severe dehydration with transaminitis AST>ALT ratio pattern, slightly elevated ammonia at 52. Last hepatobiliary imaging normal CT 05/2022. TSH normal. LDH and alk phos also elevated, normal CK  Assessment and  Interventions   Assessment: Patient did receive antivert around 9 pm trazodone 50, norco 5/325and gabapentin 200. She is also on wellbutrin XL 300 mg daily Gabapentin new med in last 3 weeks and has not been taken it daily at home Neuro alert and oriented, generalized profound weakness, no dysarthria, paresthesia or vertigo at present  Plan: D/c trazodone and gabapentin Highly recommend holding wellbutrin secondary to hepatic metabolism and active metabolites Check phos (previously ordered) Continue rehydration Supportive care of viral infection Minimize medications secondary to impaired liver function  DDX should include guillian barre and autonomic neuropathy      Donnie Mesa NP [facility] Cross Cover 7pm-7am - check amion for availability Pager [phone]

## 2023-01-16 NOTE — Progress Notes (Signed)
Rapid Response Event Note   Reason for Call : "pt seems off" per Primary RN   Initial Focused Assessment: pt states "I'm dizzy."  Slow to respond but arouses and oriented.  Pt has had a CT scan and a MRI earlier the past evening (see results).  However, pt is very weak but she is weak all over.  Able to follow commands.  Interventions: Spoke to [facility], NP medications adjusted.  See MAR.     Plan of Care: Pt will remain in current location, [facility], NP to evaluate pt, also.   Event Summary:   MD Notified: yes Call Time: 0334 Arrival Time: 0340 End Time: 0430  Conley Rolls, RN

## 2023-01-16 NOTE — Progress Notes (Addendum)
[name] epic chatted. Patient was alert to self only when PT came by to see her, they gave her 5 minutes of laying back down, she's still confused VS stable. Would prefer you come and look at patient. [name] wants to monitor this patient at this time.

## 2023-01-16 NOTE — Progress Notes (Signed)
[facility] PROGRESS NOTE    Progress Note  [name]  NWG:[id number] DOB: 04/01/1956 DOA: 01/15/2023 PCP: Myrlene Broker, MD     Brief Narrative:   [name] is an 67 y.o. female past medical history significant for exocrine pancreatic insufficiency, frequent UTI C. difficile essential hypertension, chronic diastolic heart failure with history of gastric bypass presents with vertigo started 3 days prior to admission with nausea and vomiting.  Assessment/Plan:   Diarrhea/abdominal tenderness/elevated LFTs: Of unclear etiology, on admission empiric antibiotics were started, she has elevated LFTs, chest x-ray showed probable infiltrates on the left, CT also showed possible infiltrate on the left.  She is not hypoxic satting greater than 95% on room air.  Patient her productive cough has been going on for several weeks with a new infiltrate, LDH is significantly, elevated ferritin is 189, CRP 0.5 CT scan of the abdomen pelvis showed no acute findings. Acute hepatitis panel is pending.  She has been taking Tylenol for several months at least twice a day as per patient.  This could have caused the increase in elevated LFTs. PT and INR fourteen 1.1.  Cultures have been sent.  Normal CK.  [place] level is 10. Strep pneumo is negative C. difficile PCR is pending. Continue IV fluids, complete metabolic panels pending this morning. Agree with covering for community-acquired pneumonia Rocephin and azithromycin. Ovation LFTs consider in the setting of COVID-19 and dehydration.  Points against this is a normal CRP although LDH is significantly elevated. C. difficile PCR is negative in the recent use of antibiotics and a history of C. difficile.  Orthostatic hypotension When you drilled down and she nicely described dizziness upon standing, she continued to take her antihypertensive medication while she was having diarrhea and having significant decreased oral  intake. MRI of the brain showed no acute findings chronic small vessel ischemic changes which has progressed since 2020. CT of the head showed no large vessel occlusion.There is an abnormal patchy density in the left upper lung.  Chest x-ray showed perihilar lung disease Discontinue Ativan and meclizine. Check orthostatics. TSH is 0.3  Peripheral neuropathy: Continue Neurontin.  Essential hypertension: Pressure stable continue to hold antihypertensive medication.  Obstructive sleep apnea: Patient denies it.  Normocytic anemia: Ferritin of 189, CRP of 0.5, RDW is elevated MCV is borderline with a hemoglobin of 11.9. Likely iron deficiency anemia.  Hypokalemia: Replete orally recheck in the morning. Check a magnesium level  Morbid obesity: Noted.  History of recurrent C. difficile: Abdominal exam is tender but no rebound or guarding, she was recently antibiotics and depends having diarrhea check a C. difficile PCR.   DVT prophylaxis: lovenox Family Communication:none Status is: Observation The patient will require care spanning > 2 midnights and should be moved to inpatient because: Elevated LFTs abdominal pain nausea and vomiting productive cough    Code Status:     Code Status Orders  (From admission, onward)           Start     Ordered   01/15/23 1939  Full code  Continuous       Question:  By:  Answer:  Consent: discussion documented in EHR   01/15/23 1939           Code Status History     Date Active Date Inactive Code Status Order ID Comments User Context   06/17/2022 2319 06/24/2022 2003 Full Code [id number]  Kinsinger, De Blanch, MD ED   12/19/2016 0931 12/21/2016 1715 Full Code  [id number]  Kathrynn Running, MD ED   06/07/2014 1536 06/11/2014 1635 Full Code [id number]  Maeola Harman, MD Inpatient   05/21/2013 1928 05/22/2013 2202 Full Code [id number]  Orpah Cobb, MD Inpatient   02/20/2011 0924 02/22/2011 2050 Full Code [id number]  Mirian Mo, RN  Inpatient      Advance Directive Documentation    Flowsheet Row Most Recent Value  Type of Advance Directive Living will  Pre-existing out of facility DNR order (yellow form or pink MOST form) --  "MOST" Form in Place? --         IV Access:   Peripheral IV   Procedures and diagnostic studies:   CT ABDOMEN PELVIS W CONTRAST  Result Date: 01/15/2023 CLINICAL DATA:  Abdominal pain. EXAM: CT ABDOMEN AND PELVIS WITH CONTRAST TECHNIQUE: Multidetector CT imaging of the abdomen and pelvis was performed using the standard protocol following bolus administration of intravenous contrast. RADIATION DOSE REDUCTION: This exam was performed according to the departmental dose-optimization program which includes automated exposure control, adjustment of the mA and/or kV according to patient size and/or use of iterative reconstruction technique. CONTRAST:  OMNIPAQUE IOHEXOL 300 MG/ML  SOLN COMPARISON:  CT chest abdomen pelvis dated 06/17/2022. FINDINGS: Lower chest: The visualized lung bases are clear. No intra-abdominal free air or free fluid. Hepatobiliary: The liver is unremarkable. Mild biliary ductal dilatation, post cholecystectomy. No retained calcified stone noted in the central CBD. Pancreas: Unremarkable. No pancreatic ductal dilatation or surrounding inflammatory changes. Spleen: Normal in size without focal abnormality. Adrenals/Urinary Tract: The adrenal glands are unremarkable. There is no hydronephrosis on either side. There is symmetric enhancement and excretion of contrast by both kidneys. Small left renal upper pole cyst. The visualized ureters appear unremarkable. The urinary bladder is minimally distended with excreted contrast. Stomach/Bowel: Postsurgical changes of gastric sleeve. There is mild distal colonic diverticulosis without active inflammatory changes. There is no bowel obstruction or active inflammation. The appendix is not visualized, likely surgically absent.  Vascular/Lymphatic: The abdominal aorta is unremarkable. An infrarenal IVC filter noted. No portal venous gas. There is no adenopathy. Reproductive: The uterus is grossly unremarkable. No adnexal masses. Other: Midline vertical anterior pelvic wall incisional scar. Ventral hernia repair mesh. Musculoskeletal: Lower lumbar posterior fusion hardware. Degenerative changes of the spine. No acute osseous pathology. IMPRESSION: 1. No acute intra-abdominal or pelvic pathology. 2. Mild distal colonic diverticulosis. No bowel obstruction. Electronically Signed   By: Elgie Collard M.D.   On: 01/15/2023 20:43   DG CHEST PORT 1 VIEW  Result Date: 01/15/2023 CLINICAL DATA:  Cough, sinus infection. Nausea, weakness, and diarrhea. EXAM: PORTABLE CHEST 1 VIEW COMPARISON:  06/17/2022. FINDINGS: The heart is enlarged and the mediastinal contour is within normal limits. Airspace disease is noted in the suprahilar and infrahilar regions on the left. There is chronic elevation of the right diaphragm. No effusion or pneumothorax. No acute osseous abnormality. IMPRESSION: Perihilar airspace disease on the left, possible atelectasis or infiltrate. Electronically Signed   By: Thornell Sartorius M.D.   On: 01/15/2023 20:16   CT ANGIO HEAD NECK W WO CM  Result Date: 01/15/2023 CLINICAL DATA:  Neuro deficit, acute, stroke suspected. EXAM: CT ANGIOGRAPHY HEAD AND NECK WITH AND WITHOUT CONTRAST TECHNIQUE: Multidetector CT imaging of the head and neck was performed using the standard protocol during bolus administration of intravenous contrast. Multiplanar CT image reconstructions and MIPs were obtained to evaluate the vascular anatomy. Carotid stenosis measurements (when applicable) are obtained utilizing NASCET criteria, using the distal internal  carotid diameter as the denominator. RADIATION DOSE REDUCTION: This exam was performed according to the departmental dose-optimization program which includes automated exposure control,  adjustment of the mA and/or kV according to patient size and/or use of iterative reconstruction technique. CONTRAST:  75mL OMNIPAQUE IOHEXOL 350 MG/ML SOLN COMPARISON:  MRI same day FINDINGS: CT HEAD FINDINGS The examination suffers from considerable artifactual degradation. As seen, there is no evidence of stroke, mass, hemorrhage, hydrocephalus or extra-axial collection, but detail is quite limited in the examination is not diagnostically sufficient. Thankfully, the angiography portion of the exam is of good quality. CTA NECK FINDINGS Aortic arch: Normal Right carotid system: Common carotid artery widely patent to the bifurcation. Carotid bifurcation is normal without soft or calcified plaque. Cervical ICA is normal. Left carotid system: Left carotid system similarly normal. Vertebral arteries: Both vertebral artery origins are widely patent. Both vessels appear normal through the cervical region to the foramen magnum. Skeleton: Chronic degenerative spondylosis. No acute or significant finding. Other neck: No mass or lymphadenopathy. Upper chest: Abnormal patchy density in the left upper lobe that could represent bronchopneumonia. Review of the MIP images confirms the above findings CTA HEAD FINDINGS Anterior circulation: Both internal carotid arteries are patent through the skull base and siphon regions. No siphon stenosis. Anterior and middle cerebral arteries are normal. No large vessel occlusion or proximal stenosis. No aneurysm or vascular malformation. Posterior circulation: Both vertebral arteries widely patent through the foramen magnum to the basilar artery. No basilar stenosis. Posterior circulation branch vessels are normal. Venous sinuses: Patent and normal. Anatomic variants: None significant. Review of the MIP images confirms the above findings IMPRESSION: 1. No large vessel occlusion or proximal stenosis. Normal appearance of the intracranial vessels. 2. No carotid bifurcation disease. 3. Abnormal  patchy density in the left upper lobe that could represent bronchopneumonia. 4. The head CT portion of the examination suffers from considerable artifactual degradation of image quality. As seen, there is no evidence of stroke, mass, hemorrhage, hydrocephalus or extra-axial collection, but detail is quite limited in the examination is not diagnostically sufficient. Electronically Signed   By: Paulina Fusi M.D.   On: 01/15/2023 18:07   MR Brain W and Wo Contrast  Result Date: 01/15/2023 CLINICAL DATA:  No deficit, acute, stroke suspected. EXAM: MRI HEAD WITHOUT AND WITH CONTRAST TECHNIQUE: Multiplanar, multiecho pulse sequences of the brain and surrounding structures were obtained without and with intravenous contrast. CONTRAST:  10mL GADAVIST GADOBUTROL 1 MMOL/ML IV SOLN COMPARISON:  Head CT 06/17/2022.  MRI 06/15/2018 FINDINGS: Brain: Diffusion imaging does not show any acute or subacute infarction. Chronic small-vessel ischemic changes are seen throughout the pons. No focal cerebellar insult. Cerebral hemispheres show chronic small-vessel ischemic changes of the thalami and hemispheric white matter, mildly progressive since 2020. No cortical or large vessel territory infarction. No mass lesion, hemorrhage, hydrocephalus or extra-axial collection. After contrast administration, no abnormal enhancement occurs. Vascular: Major vessels at the base of the brain show flow. Skull and upper cervical spine: Negative Sinuses/Orbits: Clear/normal Other: None IMPRESSION: No acute or reversible finding. Chronic small-vessel ischemic changes of the pons, thalami and hemispheric white matter, mildly progressive since 2020. Electronically Signed   By: Paulina Fusi M.D.   On: 01/15/2023 15:08     Medical Consultants:   None.   Subjective:    [name] she has no appetite, still dizzy upon standing.  Mild abdominal soreness  Objective:    Vitals:   01/15/23 2046 01/16/23 0045 01/16/23 0326 01/16/23  0407  BP: (!) 157/70 135/74 (!) 155/89 132/78  Pulse: (!) 59 (!) 58 62   Resp: 16 16  15   Temp: 98.3 F (36.8 C) 97.7 F (36.5 C)  98 F (36.7 C)  TempSrc: Oral Oral  Oral  SpO2: 96% 95% 95% 94%  Weight:      Height:       SpO2: 94 %   Intake/Output Summary (Last 24 hours) at 01/16/2023 0654 Last data filed at 01/16/2023 0334 Gross per 24 hour  Intake --  Output 500 ml  Net -500 ml   Filed Weights   01/15/23 1243  Weight: 112 kg    Exam: General exam: In no acute distress. Respiratory system: Good air movement and clear to auscultation. Cardiovascular system: S1 & S2 heard, RRR. No JVD, murmurs, rubs, gallops or clicks.  Gastrointestinal system: Positive bowel sounds soft no rebound or guarding some left lower quadrant and epigastric pain Central nervous system: Alert and oriented. No focal neurological deficits. Extremities: No pedal edema. Skin: No rashes, lesions or ulcers Psychiatry: Judgement and insight appear normal. Mood & affect appropriate.    Data Reviewed:    Labs: Basic Metabolic Panel: Recent Labs  Lab 01/15/23 1315 01/15/23 1923 01/15/23 28-Mar-2110  NA 141  --  142  K 3.5  --  3.4*  CL 112*  --  112*  CO2 21*  --  27  GLUCOSE 116*  --  90  BUN 12  --  10  CREATININE 0.64  --  0.77  CALCIUM 8.4*  --  8.3*  MG  --  2.1  --    GFR Estimated Creatinine Clearance: 82.8 mL/min (by C-G formula based on SCr of 0.77 mg/dL). Liver Function Tests: Recent Labs  Lab 01/15/23 28-Mar-2110  AST 716*  ALT 252*  ALKPHOS 188*  BILITOT 1.1  PROT 6.3*  ALBUMIN 3.0*   Recent Labs  Lab 01/15/23 2344  LIPASE 31   Recent Labs  Lab 01/15/23 2344  AMMONIA 52*   Coagulation profile Recent Labs  Lab 01/15/23 2344  INR 1.1   COVID-19 Labs  Recent Labs    01/15/23 March 28, 2110 01/15/23 03/28/25 01/15/23 2344  FERRITIN 189  --   --   LDH  --  727*  --   CRP  --   --  <0.5    Lab Results  Component Value Date   SARSCOV2NAA POSITIVE (A) 01/15/2023    SARSCOV2NAA NEGATIVE 07/17/2021   SARSCOV2NAA NEGATIVE 02/22/2020    CBC: Recent Labs  Lab 01/15/23 1315  WBC 8.2  NEUTROABS 5.9  HGB 11.6*  HCT 37.0  MCV 83.7  PLT 345   Cardiac Enzymes: Recent Labs  Lab 01/15/23 28-Mar-2110  CKTOTAL 63   BNP (last 3 results) No results for input(s): "PROBNP" in the last 8760 hours. CBG: Recent Labs  Lab 01/16/23 0325  GLUCAP 134*   D-Dimer: No results for input(s): "DDIMER" in the last 72 hours. Hgb A1c: No results for input(s): "HGBA1C" in the last 72 hours. Lipid Profile: No results for input(s): "CHOL", "HDL", "LDLCALC", "TRIG", "CHOLHDL", "LDLDIRECT" in the last 72 hours. Thyroid function studies: Recent Labs    01/15/23 28-Mar-2110  TSH 0.396   Anemia work up: Recent Labs    01/15/23 03-28-10  FERRITIN 189  TIBC 333  IRON 57  RETICCTPCT 1.0   Sepsis Labs: Recent Labs  Lab 01/15/23 1315 01/15/23 Mar 28, 2110 01/15/23 2344  PROCALCITON  --  0.15  --   WBC 8.2  --   --  LATICACIDVEN  --  0.6 1.5   Microbiology Recent Results (from the past 240 hour(s))  SARS Coronavirus 2 by RT PCR (hospital order, performed in [facility] hospital lab) *cepheid single result test* Anterior Nasal Swab     Status: Abnormal   Collection Time: 01/15/23  7:53 PM   Specimen: Anterior Nasal Swab  Result Value Ref Range Status   SARS Coronavirus 2 by RT PCR POSITIVE (A) NEGATIVE Final    Comment: (NOTE) SARS-CoV-2 target nucleic acids are DETECTED  SARS-CoV-2 RNA is generally detectable in upper respiratory specimens  during the acute phase of infection.  Positive results are indicative  of the presence of the identified virus, but do not rule out bacterial infection or co-infection with other pathogens not detected by the test.  Clinical correlation with patient history and  other diagnostic information is necessary to determine patient infection status.  The expected result is negative.  Fact Sheet for Patients:    RoadLapTop.co.za   Fact Sheet for Healthcare Providers:   http://kim-miller.com/    This test is not yet approved or cleared by the [place] FDA and  has been authorized for detection and/or diagnosis of SARS-CoV-2 by FDA under an Emergency Use Authorization (EUA).  This EUA will remain in effect (meaning this test can be used) for the duration of  the COVID-19 declaration under Section 564(b)(1)  of the Act, 21 U.S.C. section 360-bbb-3(b)(1), unless the authorization is terminated or revoked sooner.   Performed at [facility], [address] [place], [place] [address]   Culture, blood (Routine X 2) w Reflex to ID Panel     Status: None (Preliminary result)   Collection Time: 01/15/23 11:46 PM   Specimen: BLOOD LEFT HAND  Result Value Ref Range Status   Specimen Description   Final    BLOOD LEFT HAND AEROBIC BOTTLE ONLY Performed at [facility], [address], [place], [place] [address]    Special Requests   Final    Blood Culture adequate volume BOTTLES DRAWN AEROBIC ONLY Performed at [facility], [address] [place], [place] [address]    Culture PENDING  Incomplete   Report Status PENDING  Incomplete     Medications:    guaiFENesin  600 mg Oral BID   montelukast  10 mg Oral QHS   PARoxetine  20 mg Oral Daily   Continuous Infusions:  sodium chloride 75 mL/hr at 01/15/23 2138   azithromycin 500 mg (01/15/23 2247)   cefTRIAXone (ROCEPHIN)  IV 2 g (01/15/23 2212)      LOS: 0 days   Marinda Elk  [facility]  01/16/2023, 6:54 AM

## 2023-01-17 DIAGNOSIS — R7989 Other specified abnormal findings of blood chemistry: Secondary | ICD-10-CM | POA: Diagnosis present

## 2023-01-17 DIAGNOSIS — E860 Dehydration: Secondary | ICD-10-CM | POA: Diagnosis not present

## 2023-01-17 DIAGNOSIS — R7401 Elevation of levels of liver transaminase levels: Secondary | ICD-10-CM | POA: Diagnosis not present

## 2023-01-17 DIAGNOSIS — J189 Pneumonia, unspecified organism: Secondary | ICD-10-CM | POA: Diagnosis not present

## 2023-01-17 LAB — CBC WITH DIFFERENTIAL/PLATELET
Abs Immature Granulocytes: 0.06 10*3/uL (ref 0.00–0.07)
Basophils Absolute: 0 10*3/uL (ref 0.0–0.1)
Basophils Relative: 0 %
Eosinophils Absolute: 0 10*3/uL (ref 0.0–0.5)
Eosinophils Relative: 1 %
HCT: 34.7 % — ABNORMAL LOW (ref 36.0–46.0)
Hemoglobin: 10.5 g/dL — ABNORMAL LOW (ref 12.0–15.0)
Immature Granulocytes: 1 %
Lymphocytes Relative: 44 %
Lymphs Abs: 3 10*3/uL (ref 0.7–4.0)
MCH: 25.8 pg — ABNORMAL LOW (ref 26.0–34.0)
MCHC: 30.3 g/dL (ref 30.0–36.0)
MCV: 85.3 fL (ref 80.0–100.0)
Monocytes Absolute: 0.4 10*3/uL (ref 0.1–1.0)
Monocytes Relative: 6 %
Neutro Abs: 3.3 10*3/uL (ref 1.7–7.7)
Neutrophils Relative %: 48 %
Platelets: 335 10*3/uL (ref 150–400)
RBC: 4.07 MIL/uL (ref 3.87–5.11)
RDW: 17.3 % — ABNORMAL HIGH (ref 11.5–15.5)
WBC: 6.8 10*3/uL (ref 4.0–10.5)
nRBC: 0 % (ref 0.0–0.2)

## 2023-01-17 LAB — COMPREHENSIVE METABOLIC PANEL
ALT: 198 U/L — ABNORMAL HIGH (ref 0–44)
AST: 170 U/L — ABNORMAL HIGH (ref 15–41)
Albumin: 2.7 g/dL — ABNORMAL LOW (ref 3.5–5.0)
Alkaline Phosphatase: 165 U/L — ABNORMAL HIGH (ref 38–126)
Anion gap: 8 (ref 5–15)
BUN: 13 mg/dL (ref 8–23)
CO2: 21 mmol/L — ABNORMAL LOW (ref 22–32)
Calcium: 8.4 mg/dL — ABNORMAL LOW (ref 8.9–10.3)
Chloride: 115 mmol/L — ABNORMAL HIGH (ref 98–111)
Creatinine, Ser: 0.92 mg/dL (ref 0.44–1.00)
GFR, Estimated: >60 mL/min (ref >60)
Glucose, Bld: 92 mg/dL (ref 70–99)
Potassium: 4 mmol/L (ref 3.5–5.1)
Sodium: 144 mmol/L (ref 135–145)
Total Bilirubin: 0.5 mg/dL (ref 0.3–1.2)
Total Protein: 5.9 g/dL — ABNORMAL LOW (ref 6.5–8.1)

## 2023-01-17 MED ORDER — GUAIFENESIN 100 MG/5ML PO LIQD
5.0000 mL | ORAL | Status: DC | PRN
  Administered 2023-01-17 – 2023-01-22 (×4): 5 mL via ORAL
  Filled 2023-01-17 (×3): qty 10
  Filled 2023-01-17: qty 5

## 2023-01-17 MED ORDER — AZITHROMYCIN 250 MG PO TABS
500.0000 mg | ORAL_TABLET | Freq: Every day | ORAL | Status: DC
  Administered 2023-01-17 – 2023-01-18 (×2): 500 mg via ORAL
  Filled 2023-01-17 (×2): qty 2

## 2023-01-17 MED ORDER — KETOROLAC TROMETHAMINE 15 MG/ML IJ SOLN
15.0000 mg | Freq: Three times a day (TID) | INTRAMUSCULAR | Status: AC | PRN
  Administered 2023-01-17 – 2023-01-21 (×7): 15 mg via INTRAVENOUS
  Filled 2023-01-17 (×7): qty 1

## 2023-01-17 MED ORDER — ACETAMINOPHEN 325 MG PO TABS: 650.0000 mg | ORAL_TABLET | Freq: Four times a day (QID) | ORAL | Status: DC | PRN

## 2023-01-17 MED ORDER — FERROUS SULFATE 325 (65 FE) MG PO TABS
325.0000 mg | ORAL_TABLET | Freq: Every day | ORAL | Status: DC
  Administered 2023-01-18 – 2023-01-25 (×8): 325 mg via ORAL
  Filled 2023-01-17 (×8): qty 1

## 2023-01-17 NOTE — Plan of Care (Signed)
  Problem: Nutrition: Goal: Adequate nutrition will be maintained Outcome: Not Progressing   

## 2023-01-17 NOTE — Progress Notes (Signed)
PHARMACIST - PHYSICIAN COMMUNICATION DR:   David Stall CONCERNING: Antibiotic IV to Oral Route Change Policy  RECOMMENDATION: This patient is receiving azithromycin by the intravenous route.  Based on criteria approved by the Pharmacy and Therapeutics Committee, the antibiotic(s) is/are being converted to the equivalent oral dose form(s).   DESCRIPTION: These criteria include: Patient being treated for a respiratory tract infection, urinary tract infection, cellulitis or clostridium difficile associated diarrhea if on metronidazole The patient is not neutropenic and does not exhibit a GI malabsorption state The patient is eating (either orally or via tube) and/or has been taking other orally administered medications for a least 24 hours The patient is improving clinically and has a Tmax < 100.5  If you have questions about this conversion, please contact the Pharmacy Department  []   [phone] )  [facility] []   [phone] )  [facility]  []   [phone] )  [facility] [x]   [phone] )  [facility]   Thank you for allowing pharmacy to be a part of this patient's care.  Selinda Eon, PharmD, BCPS Clinical Pharmacist [place] 01/17/2023 11:31 AM

## 2023-01-17 NOTE — Plan of Care (Signed)

## 2023-01-17 NOTE — Progress Notes (Signed)
[facility] PROGRESS NOTE    Progress Note  [name]  ZDG:[id number] DOB: [birth year] DOA: 01/15/2023 PCP: Myrlene Broker, MD     Brief Narrative:   [name] is an 67 y.o. female past medical history significant for exocrine pancreatic insufficiency, frequent UTI C. difficile essential hypertension, chronic diastolic heart failure with history of gastric bypass presents with vertigo started 3 days prior to admission with nausea and vomiting.  Assessment/Plan:   Diarrhea/abdominal tenderness/elevated LFTs: Of unclear etiology, on admission empiric antibiotics were started, she has elevated LFTs, chest x-ray showed probable infiltrates on the left, CT also showed possible infiltrate on the left.  CT scan of the abdomen pelvis showed no acute findings. Acute hepatitis panel negative.   She has been taking Tylenol for several months at least twice a day as per patient.  This could have caused the increase in elevated LFTs. Acetaminophen level was 10 on admission. LFTs continue to improve with IV fluids and holding acetaminophen.   Left upper lobe infiltrates with a cough possible community-acquired pneumonia : started empirically on antibiotics on admission. Has remained afebrile no leukocytosis.  Orthostatic hypotension When you drilled down and she nicely described dizziness upon standing, she continued to take her antihypertensive medication while she was having diarrhea and having significant decreased oral intake.  Orthostatics were negative. MRI of the brain showed no acute findings chronic small vessel ischemic changes which has progressed since 2020. Discontinue Ativan and meclizine. Now resolved. PT OT evaluated the patient recommended home health PT.  Peripheral neuropathy: Continue Neurontin.  Essential hypertension: Pressure stable continue to hold antihypertensive medication.  Obstructive sleep apnea: Patient denies  it.  Normocytic anemia: Ferritin of 189, CRP of 0.5, RDW is elevated MCV is borderline with a hemoglobin of 11.9. Likely iron deficiency anemia, started on ferrous sulfate. Needs to have a follow-up colonoscopy as an outpatient.  Hypokalemia: Repleted now resolved.  Morbid obesity: Noted.  History of recurrent C. difficile: Abdominal exam is tender but no rebound or guarding, she is no longer having diarrhea discontinue C. difficile PCR.   DVT prophylaxis: lovenox Family Communication:none Status is: Observation The patient will require care spanning > 2 midnights and should be moved to inpatient because: Elevated LFTs abdominal pain nausea and vomiting productive cough    Code Status:     Code Status Orders  (From admission, onward)           Start     Ordered   01/15/23 1939  Full code  Continuous       Question:  By:  Answer:  Consent: discussion documented in EHR   01/15/23 1939           Code Status History     Date Active Date Inactive Code Status Order ID Comments User Context   06/17/2022 2319 06/24/2022 2003 Full Code [id number]  Kinsinger, De Blanch, MD ED   12/19/2016 0931 12/21/2016 1715 Full Code [id number]  Kathrynn Running, MD ED   06/07/2014 1536 06/11/2014 1635 Full Code [id number]  Maeola Harman, MD Inpatient   05/21/2013 1928 05/22/2013 2202 Full Code [id number]  Orpah Cobb, MD Inpatient   02/20/2011 0924 02/22/2011 2050 Full Code [id number]  Mirian Mo, RN Inpatient      Advance Directive Documentation    Flowsheet Row Most Recent Value  Type of Advance Directive Living will  Pre-existing out of facility DNR order (yellow form or pink MOST form) --  "MOST" Form in Place? --  IV Access:   Peripheral IV   Procedures and diagnostic studies:   CT ABDOMEN PELVIS W CONTRAST  Result Date: 01/15/2023 CLINICAL DATA:  Abdominal pain. EXAM: CT ABDOMEN AND PELVIS WITH CONTRAST TECHNIQUE: Multidetector CT imaging of the abdomen and  pelvis was performed using the standard protocol following bolus administration of intravenous contrast. RADIATION DOSE REDUCTION: This exam was performed according to the departmental dose-optimization program which includes automated exposure control, adjustment of the mA and/or kV according to patient size and/or use of iterative reconstruction technique. CONTRAST:  OMNIPAQUE IOHEXOL 300 MG/ML  SOLN COMPARISON:  CT chest abdomen pelvis dated 06/17/2022. FINDINGS: Lower chest: The visualized lung bases are clear. No intra-abdominal free air or free fluid. Hepatobiliary: The liver is unremarkable. Mild biliary ductal dilatation, post cholecystectomy. No retained calcified stone noted in the central CBD. Pancreas: Unremarkable. No pancreatic ductal dilatation or surrounding inflammatory changes. Spleen: Normal in size without focal abnormality. Adrenals/Urinary Tract: The adrenal glands are unremarkable. There is no hydronephrosis on either side. There is symmetric enhancement and excretion of contrast by both kidneys. Small left renal upper pole cyst. The visualized ureters appear unremarkable. The urinary bladder is minimally distended with excreted contrast. Stomach/Bowel: Postsurgical changes of gastric sleeve. There is mild distal colonic diverticulosis without active inflammatory changes. There is no bowel obstruction or active inflammation. The appendix is not visualized, likely surgically absent. Vascular/Lymphatic: The abdominal aorta is unremarkable. An infrarenal IVC filter noted. No portal venous gas. There is no adenopathy. Reproductive: The uterus is grossly unremarkable. No adnexal masses. Other: Midline vertical anterior pelvic wall incisional scar. Ventral hernia repair mesh. Musculoskeletal: Lower lumbar posterior fusion hardware. Degenerative changes of the spine. No acute osseous pathology. IMPRESSION: 1. No acute intra-abdominal or pelvic pathology. 2. Mild distal colonic diverticulosis.  No bowel obstruction. Electronically Signed   By: Elgie Collard M.D.   On: 01/15/2023 20:43   DG CHEST PORT 1 VIEW  Result Date: 01/15/2023 CLINICAL DATA:  Cough, sinus infection. Nausea, weakness, and diarrhea. EXAM: PORTABLE CHEST 1 VIEW COMPARISON:  06/17/2022. FINDINGS: The heart is enlarged and the mediastinal contour is within normal limits. Airspace disease is noted in the suprahilar and infrahilar regions on the left. There is chronic elevation of the right diaphragm. No effusion or pneumothorax. No acute osseous abnormality. IMPRESSION: Perihilar airspace disease on the left, possible atelectasis or infiltrate. Electronically Signed   By: Thornell Sartorius M.D.   On: 01/15/2023 20:16   CT ANGIO HEAD NECK W WO CM  Result Date: 01/15/2023 CLINICAL DATA:  Neuro deficit, acute, stroke suspected. EXAM: CT ANGIOGRAPHY HEAD AND NECK WITH AND WITHOUT CONTRAST TECHNIQUE: Multidetector CT imaging of the head and neck was performed using the standard protocol during bolus administration of intravenous contrast. Multiplanar CT image reconstructions and MIPs were obtained to evaluate the vascular anatomy. Carotid stenosis measurements (when applicable) are obtained utilizing NASCET criteria, using the distal internal carotid diameter as the denominator. RADIATION DOSE REDUCTION: This exam was performed according to the departmental dose-optimization program which includes automated exposure control, adjustment of the mA and/or kV according to patient size and/or use of iterative reconstruction technique. CONTRAST:  75mL OMNIPAQUE IOHEXOL 350 MG/ML SOLN COMPARISON:  MRI same day FINDINGS: CT HEAD FINDINGS The examination suffers from considerable artifactual degradation. As seen, there is no evidence of stroke, mass, hemorrhage, hydrocephalus or extra-axial collection, but detail is quite limited in the examination is not diagnostically sufficient. Thankfully, the angiography portion of the exam is of good  quality.  CTA NECK FINDINGS Aortic arch: Normal Right carotid system: Common carotid artery widely patent to the bifurcation. Carotid bifurcation is normal without soft or calcified plaque. Cervical ICA is normal. Left carotid system: Left carotid system similarly normal. Vertebral arteries: Both vertebral artery origins are widely patent. Both vessels appear normal through the cervical region to the foramen magnum. Skeleton: Chronic degenerative spondylosis. No acute or significant finding. Other neck: No mass or lymphadenopathy. Upper chest: Abnormal patchy density in the left upper lobe that could represent bronchopneumonia. Review of the MIP images confirms the above findings CTA HEAD FINDINGS Anterior circulation: Both internal carotid arteries are patent through the skull base and siphon regions. No siphon stenosis. Anterior and middle cerebral arteries are normal. No large vessel occlusion or proximal stenosis. No aneurysm or vascular malformation. Posterior circulation: Both vertebral arteries widely patent through the foramen magnum to the basilar artery. No basilar stenosis. Posterior circulation branch vessels are normal. Venous sinuses: Patent and normal. Anatomic variants: None significant. Review of the MIP images confirms the above findings IMPRESSION: 1. No large vessel occlusion or proximal stenosis. Normal appearance of the intracranial vessels. 2. No carotid bifurcation disease. 3. Abnormal patchy density in the left upper lobe that could represent bronchopneumonia. 4. The head CT portion of the examination suffers from considerable artifactual degradation of image quality. As seen, there is no evidence of stroke, mass, hemorrhage, hydrocephalus or extra-axial collection, but detail is quite limited in the examination is not diagnostically sufficient. Electronically Signed   By: Paulina Fusi M.D.   On: 01/15/2023 18:07   MR Brain W and Wo Contrast  Result Date: 01/15/2023 CLINICAL DATA:  No  deficit, acute, stroke suspected. EXAM: MRI HEAD WITHOUT AND WITH CONTRAST TECHNIQUE: Multiplanar, multiecho pulse sequences of the brain and surrounding structures were obtained without and with intravenous contrast. CONTRAST:  10mL GADAVIST GADOBUTROL 1 MMOL/ML IV SOLN COMPARISON:  Head CT 06/17/2022.  MRI 06/15/2018 FINDINGS: Brain: Diffusion imaging does not show any acute or subacute infarction. Chronic small-vessel ischemic changes are seen throughout the pons. No focal cerebellar insult. Cerebral hemispheres show chronic small-vessel ischemic changes of the thalami and hemispheric white matter, mildly progressive since 2020. No cortical or large vessel territory infarction. No mass lesion, hemorrhage, hydrocephalus or extra-axial collection. After contrast administration, no abnormal enhancement occurs. Vascular: Major vessels at the base of the brain show flow. Skull and upper cervical spine: Negative Sinuses/Orbits: Clear/normal Other: None IMPRESSION: No acute or reversible finding. Chronic small-vessel ischemic changes of the pons, thalami and hemispheric white matter, mildly progressive since 2020. Electronically Signed   By: Paulina Fusi M.D.   On: 01/15/2023 15:08     Medical Consultants:   None.   Subjective:    [name] patient feels much better dizziness upon standing is improving.  Objective:    Vitals:   01/16/23 1303 01/16/23 1942 01/17/23 0454 01/17/23 0749  BP: (!) 148/63 (!) 146/85 (!) 142/85 (!) 133/97  Pulse: 62 (!) 59 (!) 58 61  Resp: 18 16 16 17   Temp: (!) 97.4 F (36.3 C) 98.2 F (36.8 C) 98.2 F (36.8 C) 98.3 F (36.8 C)  TempSrc: Axillary Oral Oral Oral  SpO2: 99% 99% 95% 98%  Weight:      Height:       SpO2: 98 %   Intake/Output Summary (Last 24 hours) at 01/17/2023 0820 Last data filed at 01/17/2023 0752 Gross per 24 hour  Intake 1201.87 ml  Output 1825 ml  Net -623.13 ml  Filed Weights   01/15/23 1243  Weight: 112 kg     Exam: General exam: In no acute distress. Respiratory system: Good air movement and clear to auscultation. Cardiovascular system: S1 & S2 heard, RRR. No JVD. Gastrointestinal system: Abdomen is nondistended, soft and nontender.  Extremities: No pedal edema. Skin: No rashes, lesions or ulcers Psychiatry: Judgement and insight appear normal. Mood & affect appropriate.  Data Reviewed:    Labs: Basic Metabolic Panel: Recent Labs  Lab 01/15/23 1315 01/15/23 1923 01/15/23 03-18-2110 01/16/23 0908 01/17/23 0450  NA 141  --  142 143 144  K 3.5  --  3.4* 4.2 4.0  CL 112*  --  112* 113* 115*  CO2 21*  --  27 23 21*  GLUCOSE 116*  --  90 203* 92  BUN 12  --  10 9 13   CREATININE 0.64  --  0.77 0.75 0.92  CALCIUM 8.4*  --  8.3* 8.5* 8.4*  MG  --  2.1  --  2.1  --   PHOS  --   --   --  2.9  --    GFR Estimated Creatinine Clearance: 72 mL/min (by C-G formula based on SCr of 0.92 mg/dL). Liver Function Tests: Recent Labs  Lab 01/15/23 03/18/2110 01/16/23 0908 01/17/23 0450  AST 716* 416* 170*  ALT 252* 276* 198*  ALKPHOS 188* 208* 165*  BILITOT 1.1 0.9 0.5  PROT 6.3* 6.8 5.9*  ALBUMIN 3.0* 3.1* 2.7*   Recent Labs  Lab 01/15/23 2344  LIPASE 31   Recent Labs  Lab 01/15/23 2344  AMMONIA 52*   Coagulation profile Recent Labs  Lab 01/15/23 2344  INR 1.1   COVID-19 Labs  Recent Labs    01/15/23 03-18-10 01/15/23 March 18, 2125 01/15/23 2344  FERRITIN 189  --   --   LDH  --  727*  --   CRP  --   --  <0.5    Lab Results  Component Value Date   SARSCOV2NAA POSITIVE (A) 01/15/2023   SARSCOV2NAA NEGATIVE 07/17/2021   SARSCOV2NAA NEGATIVE 02/22/2020    CBC: Recent Labs  Lab 01/15/23 1315 01/16/23 0908  WBC 8.2 2.4*  NEUTROABS 5.9  --   HGB 11.6* 11.8*  HCT 37.0 38.5  MCV 83.7 85.2  PLT 345 347   Cardiac Enzymes: Recent Labs  Lab 01/15/23 03/18/10  CKTOTAL 63   BNP (last 3 results) No results for input(s): "PROBNP" in the last 8760 hours. CBG: Recent Labs  Lab  01/16/23 0325  GLUCAP 134*   D-Dimer: No results for input(s): "DDIMER" in the last 72 hours. Hgb A1c: No results for input(s): "HGBA1C" in the last 72 hours. Lipid Profile: No results for input(s): "CHOL", "HDL", "LDLCALC", "TRIG", "CHOLHDL", "LDLDIRECT" in the last 72 hours. Thyroid function studies: Recent Labs    01/15/23 2110/03/18  TSH 0.396   Anemia work up: Recent Labs    01/15/23 March 18, 2110 01/16/23 0908  VITAMINB12  --  4,853*  FOLATE  --  15.2  FERRITIN 189  --   TIBC 333  --   IRON 57  --   RETICCTPCT 1.0  --    Sepsis Labs: Recent Labs  Lab 01/15/23 1315 01/15/23 March 18, 2110 01/15/23 2344 01/16/23 0908  PROCALCITON  --  0.15  --   --   WBC 8.2  --   --  2.4*  LATICACIDVEN  --  0.6 1.5  --    Microbiology Recent Results (from the past 240 hour(s))  SARS Coronavirus 2  by RT PCR (hospital order, performed in [facility] hospital lab) *cepheid single result test* Anterior Nasal Swab     Status: Abnormal   Collection Time: 01/15/23  7:53 PM   Specimen: Anterior Nasal Swab  Result Value Ref Range Status   SARS Coronavirus 2 by RT PCR POSITIVE (A) NEGATIVE Final    Comment: (NOTE) SARS-CoV-2 target nucleic acids are DETECTED  SARS-CoV-2 RNA is generally detectable in upper respiratory specimens  during the acute phase of infection.  Positive results are indicative  of the presence of the identified virus, but do not rule out bacterial infection or co-infection with other pathogens not detected by the test.  Clinical correlation with patient history and  other diagnostic information is necessary to determine patient infection status.  The expected result is negative.  Fact Sheet for Patients:   RoadLapTop.co.za   Fact Sheet for Healthcare Providers:   http://kim-miller.com/    This test is not yet approved or cleared by the [place] FDA and  has been authorized for detection and/or diagnosis of SARS-CoV-2 by FDA under  an Emergency Use Authorization (EUA).  This EUA will remain in effect (meaning this test can be used) for the duration of  the COVID-19 declaration under Section 564(b)(1)  of the Act, 21 U.S.C. section 360-bbb-3(b)(1), unless the authorization is terminated or revoked sooner.   Performed at [facility], [address] [place], [place] [address]   Culture, blood (Routine X 2) w Reflex to ID Panel     Status: None (Preliminary result)   Collection Time: 01/15/23 11:44 PM   Specimen: BLOOD  Result Value Ref Range Status   Specimen Description   Final    BLOOD BLOOD RIGHT HAND Performed at [facility], [address] [place], [place] [address]    Special Requests   Final    Blood Culture adequate volume BOTTLES DRAWN AEROBIC ONLY Performed at [facility], [address] [place], [place] [address]    Culture   Final    NO GROWTH 1 DAY Performed at [facility], [address], [place], [place] [address]    Report Status PENDING  Incomplete  Culture, blood (Routine X 2) w Reflex to ID Panel     Status: None (Preliminary result)   Collection Time: 01/15/23 11:46 PM   Specimen: BLOOD LEFT HAND  Result Value Ref Range Status   Specimen Description   Final    BLOOD LEFT HAND AEROBIC BOTTLE ONLY Performed at [facility], [address] [place], [place] [address]    Special Requests   Final    Blood Culture adequate volume BOTTLES DRAWN AEROBIC ONLY Performed at [facility], [address], [place], [place] [address]    Culture   Final    NO GROWTH 1 DAY Performed at [facility], [address], [place], [place] [address]    Report Status PENDING  Incomplete     Medications:    guaiFENesin  600 mg Oral BID   montelukast  10 mg Oral QHS   PARoxetine  20 mg Oral Daily   potassium chloride  20 mEq Oral BID   Continuous Infusions:  sodium chloride 75 mL/hr at 01/17/23 0555    azithromycin 500 mg (01/16/23 2155)   cefTRIAXone (ROCEPHIN)  IV 2 g (01/16/23 2018)      LOS: 1 day   Marinda Elk  [facility]  01/17/2023, 8:20  AM

## 2023-01-17 NOTE — Telephone Encounter (Signed)
Closing this encounter this has been addressed
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
## 2023-01-17 NOTE — Telephone Encounter (Signed)
Pharmacy Patient Advocate Encounter  Received notification from [facility] that Prior Authorization for Wegovy 0.25mg /0.77ml has been DENIED.  Full denial letter will be uploaded to the media tab. See denial reason below.

## 2023-01-18 DIAGNOSIS — T391X1A Poisoning by 4-Aminophenol derivatives, accidental (unintentional), initial encounter: Secondary | ICD-10-CM | POA: Diagnosis not present

## 2023-01-18 DIAGNOSIS — R7401 Elevation of levels of liver transaminase levels: Secondary | ICD-10-CM | POA: Diagnosis not present

## 2023-01-18 DIAGNOSIS — J189 Pneumonia, unspecified organism: Secondary | ICD-10-CM | POA: Diagnosis not present

## 2023-01-18 DIAGNOSIS — R10827 Generalized rebound abdominal tenderness: Secondary | ICD-10-CM | POA: Diagnosis not present

## 2023-01-18 DIAGNOSIS — U071 COVID-19: Secondary | ICD-10-CM

## 2023-01-18 LAB — COMPREHENSIVE METABOLIC PANEL
ALT: 157 U/L — ABNORMAL HIGH (ref 0–44)
AST: 93 U/L — ABNORMAL HIGH (ref 15–41)
Albumin: 2.7 g/dL — ABNORMAL LOW (ref 3.5–5.0)
Alkaline Phosphatase: 135 U/L — ABNORMAL HIGH (ref 38–126)
Anion gap: 6 (ref 5–15)
BUN: 14 mg/dL (ref 8–23)
CO2: 19 mmol/L — ABNORMAL LOW (ref 22–32)
Calcium: 8.4 mg/dL — ABNORMAL LOW (ref 8.9–10.3)
Chloride: 113 mmol/L — ABNORMAL HIGH (ref 98–111)
Creatinine, Ser: 0.79 mg/dL (ref 0.44–1.00)
GFR, Estimated: >60 mL/min (ref >60)
Glucose, Bld: 89 mg/dL (ref 70–99)
Potassium: 4 mmol/L (ref 3.5–5.1)
Sodium: 138 mmol/L (ref 135–145)
Total Bilirubin: 0.5 mg/dL (ref 0.3–1.2)
Total Protein: 5.8 g/dL — ABNORMAL LOW (ref 6.5–8.1)

## 2023-01-18 LAB — LEGIONELLA PNEUMOPHILA SEROGP 1 UR AG: L. pneumophila Serogp 1 Ur Ag: NEGATIVE

## 2023-01-18 LAB — VITAMIN B1: Vitamin B1 (Thiamine): 101.3 nmol/L (ref 66.5–200.0)

## 2023-01-18 MED ORDER — FERROUS SULFATE 325 (65 FE) MG PO TABS: 325.0000 mg | ORAL_TABLET | Freq: Every day | ORAL | 3 refills | Status: AC

## 2023-01-18 MED ORDER — AMLODIPINE BESYLATE 10 MG PO TABS: 10.0000 mg | ORAL_TABLET | Freq: Every day | ORAL | 3 refills | Status: DC

## 2023-01-18 MED ORDER — AZITHROMYCIN 250 MG PO TABS: ORAL_TABLET | ORAL | 0 refills | Status: DC

## 2023-01-18 MED ORDER — CEFDINIR 300 MG PO CAPS: 300.0000 mg | ORAL_CAPSULE | Freq: Two times a day (BID) | ORAL | 0 refills | Status: DC

## 2023-01-18 NOTE — TOC Transition Note (Signed)
Transition of Care [facility]) - CM/SW Discharge Note   Patient Details  Name: [name] MRN: [id number] Date of Birth: 11-29-1955  Transition [facility]) CM/SW Contact:  Otelia Santee, LCSW Phone Number: 01/18/2023, 11:19 AM   Clinical Narrative:    Met with pt and confirmed pt would prefer to return home vs SNF placement. Pt is agreeable to having home health arranged. HHPT/OT arranged with [facility]. Pt requests BSC and shower seat however, after pt was informed of private pay for this pt declined ordering DME. Pt to return to home via [facility]. [facility] called at 11:15am.    Final next level of care: Home w Home Health Services Barriers to Discharge: Barriers Resolved   Patient Goals and CMS Choice CMS Medicare.gov Compare Post Acute Care list provided to:: Patient Choice offered to / list presented to : Patient  Discharge Placement                         Discharge Plan and Services Additional resources added to the After Visit Summary for   In-house Referral: Clinical Social Work Discharge Planning Services: NA Post Acute Care Choice: Skilled Nursing Facility          DME Arranged: N/A DME Agency: NA       HH Arranged: PT HH Agency: [facility] Date [facility] Agency Contacted: 01/18/23 Time HH Agency Contacted: [id number] Representative spoke with at [facility] Agency: Cindie  Social Determinants of Health (SDOH) Interventions SDOH Screenings   Food Insecurity: No Food Insecurity (01/15/2023)  Recent Concern: Food Insecurity - Food Insecurity Present (01/04/2023)  Housing: Low Risk  (01/15/2023)  Transportation Needs: No Transportation Needs (01/15/2023)  Recent Concern: Transportation Needs - Unmet Transportation Needs (12/31/2022)  Utilities: Not At Risk (01/15/2023)  Recent Concern: Utilities - At Risk (01/04/2023)  Alcohol Screen: Low Risk  (10/05/2022)  Depression (PHQ2-9): Low Risk  (10/05/2022)  Financial Resource Strain: Medium Risk (10/05/2022)  Physical  Activity: Inactive (10/05/2022)  Social Connections: Moderately Isolated (10/05/2022)  Stress: No Stress Concern Present (10/05/2022)  Tobacco Use: Medium Risk (01/15/2023)     Readmission Risk Interventions    01/17/2023    2:53 PM  Readmission Risk Prevention Plan  Post Dischage Appt Complete  Medication Screening Complete  Transportation Screening Complete

## 2023-01-18 NOTE — Care Management Important Message (Signed)
Important Message  Patient Details IM Letter given. Name: [name] MRN: [id number] Date of Birth: 1956-03-02   Important Message Given:  Yes - Medicare IM     Caren Macadam 01/18/2023, 10:44 AM

## 2023-01-18 NOTE — Plan of Care (Signed)
?  Problem: Clinical Measurements: ?Goal: Ability to maintain clinical measurements within normal limits will improve ?Outcome: Progressing ?Goal: Will remain free from infection ?Outcome: Progressing ?Goal: Diagnostic test results will improve ?Outcome: Progressing ?  ?

## 2023-01-18 NOTE — Discharge Summary (Addendum)
Physician Discharge Summary  VIOLETTA DIMON KVQ:[id number] DOB: 1956-03-16 DOA: 01/15/2023  PCP: Myrlene Broker, MD  Admit date: 01/15/2023 Discharge date: 01/18/2023  Admitted From: Home Disposition:  Home, patient refused skilled nursing facility.    Recommendations for Outpatient Follow-up:  Follow up with PCP in 1-2 weeks Please obtain BMP/CBC in one week   Home Health:Yes Equipment/Devices:None  Discharge Condition:Stable CODE STATUS:Full Diet recommendation: Heart Healthy    Brief/Interim Summary: 67 y.o. female past medical history significant for exocrine pancreatic insufficiency, frequent UTI C. difficile essential hypertension, chronic diastolic heart failure with history of gastric bypass presents with vertigo started 3 days prior to admission with nausea and vomiting.    Discharge Diagnoses:  Principal Problem:   Transaminitis Active Problems:   Hx of diabetes mellitus   Morbid obesity (HCC)   OBSTRUCTIVE SLEEP APNEA   Essential hypertension   Peripheral neuropathy   Recurrent Clostridioides difficile infection   CAP (community acquired pneumonia)   Anemia   Dehydration   Orthostatic hypotension   Abdominal tenderness   COVID-19 virus infection   Elevated LFTs  Abdominal tenderness elevated LFT's possibly due to Tylenol toxicity: Possibly due to Tylenol toxicity on admission was started on IV antibiotics. Chest x-ray showed probable infiltrate. CT scan of the abdomen pelvis showed no acute findings. Acute hepatitis panel was negative. The patient had been taking Tylenol every day twice a day for the last several months Tylenol level was 10 on admission she was treated conservatively her LFTs drastically improved. She has been told to avoid Tylenol intake. She will use tramadol for now on  Possible community-acquired pneumonia: She remained febrile and leukocytosis, due to her persistent cough will complete a 7-day course as an  outpatient.  Incidental COVID-19 test positive: She remained afebrile, no upper or lower respiratory symptoms.  Orthostatic hypotension: Resolved with IV fluids. MRI of the brain showed no acute findings. She was started on Ativan and meclizine these were discontinued orthostasis resolved. Physical therapy evaluated the patient, she was told she needed to go to rehab, but she adamantly refused she wants to go home with PT OT despite knowing risk and benefits. PT to follow-up as an outpatient.  Peripheral neuropathy, Continue Neurontin.  Central hypertension: She can resume them as an outpatient in a few days.  Obstructive sleep apnea: The patient denies it.  Normocytic anemia: Will need a follow-up colonoscopy as an outpatient will start on ferrous sulfate daily.  Hypokalemia: Repleted.  Morbid obesity: Noted.  History of recurrent C. difficile: She was complaining of diarrhea her bowel movements resolved in house, C. difficile PCR was discontinued as her diarrhea resolved.   Discharge Instructions  Discharge Instructions     Diet - low sodium heart healthy   Complete by: As directed    Increase activity slowly   Complete by: As directed       Allergies as of 01/18/2023       Reactions   Carafate [sucralfate] Hives, Itching, Other (See Comments)   Angioedema    Sulfonamide Derivatives Rash   Syncope        Medication List     STOP taking these medications    acetaminophen 500 MG tablet Commonly known as: TYLENOL   cephALEXin 500 MG capsule Commonly known as: KEFLEX   torsemide 20 MG tablet Commonly known as: DEMADEX       TAKE these medications    albuterol 108 (90 Base) MCG/ACT inhaler Commonly known as: VENTOLIN HFA INHALE 2 PUFFS  INTO THE LUNGS EVERY 6 HOURS AS NEEDED FOR WHEEZING OR SHORTNESS OF BREATH   albuterol (2.5 MG/3ML) 0.083% nebulizer solution Commonly known as: PROVENTIL Take 3 mLs (2.5 mg total) by nebulization every 6  (six) hours as needed for wheezing or shortness of breath.   amLODipine 10 MG tablet Commonly known as: NORVASC Take 1 tablet (10 mg total) by mouth daily.   azithromycin 250 MG tablet Commonly known as: ZITHROMAX Take 1 tab daily   benzonatate 100 MG capsule Commonly known as: TESSALON Take 1 capsule (100 mg total) by mouth 3 (three) times daily as needed for cough.   BIOFREEZE EX Apply 1 Application topically as needed (Pain).   buPROPion 300 MG 24 hr tablet Commonly known as: WELLBUTRIN XL Take 1 tablet (300 mg total) by mouth daily.   cefdinir 300 MG capsule Commonly known as: OMNICEF Take 1 capsule (300 mg total) by mouth 2 (two) times daily for 2 days.   dextromethorphan-guaiFENesin 30-600 MG 12hr tablet Commonly known as: MUCINEX DM Take 1 tablet by mouth 2 (two) times daily as needed for cough.   ferrous sulfate 325 (65 FE) MG tablet Take 1 tablet (325 mg total) by mouth daily with breakfast.   gabapentin 100 MG capsule Commonly known as: NEURONTIN Take 2 capsules (200 mg total) by mouth 2 (two) times daily.   ipratropium 0.03 % nasal spray Commonly known as: ATROVENT Place 2 sprays into both nostrils every 12 (twelve) hours. X 5-7 days What changed:  when to take this reasons to take this additional instructions   lipase/protease/amylase 95621 UNITS Cpep capsule Commonly known as: CREON Take 36,000 Units by mouth 4 (four) times daily as needed (Stomach Pain).   loperamide 2 MG capsule Commonly known as: IMODIUM Take 1 capsule (2 mg total) by mouth as needed for diarrhea or loose stools.   MAGNESIUM PO Take 1 tablet by mouth daily.   montelukast 10 MG tablet Commonly known as: SINGULAIR Take 1 tablet (10 mg total) by mouth at bedtime.   Multi-Vitamin tablet Take 1 tablet by mouth daily.   ondansetron 4 MG tablet Commonly known as: Zofran Take 1 tablet (4 mg total) by mouth every 8 (eight) hours as needed for nausea or vomiting.   PARoxetine 20  MG tablet Commonly known as: PAXIL Take 1 tablet (20 mg total) by mouth daily.   psyllium 95 % Pack Commonly known as: HYDROCIL/METAMUCIL Take 1 packet by mouth 2 (two) times daily. What changed:  when to take this reasons to take this   traMADol 50 MG tablet Commonly known as: ULTRAM Take 2 tablets (100 mg total) by mouth 2 (two) times daily. What changed: how much to take   traZODone 50 MG tablet Commonly known as: DESYREL Take 1 tablet (50 mg total) by mouth at bedtime as needed for sleep.   vitamin C 1000 MG tablet Take 1,000 mg by mouth daily.   VITAMIN D PO Take 1 capsule by mouth daily.        Follow-up Information     Schedule an appointment as soon as possible for a visit  with Connect with your PCP/Specialist as discussed.   Contact information: [email or web address] Call our physician referral line at [phone].               Allergies  Allergen Reactions   Carafate [Sucralfate] Hives, Itching and Other (See Comments)    Angioedema      Sulfonamide Derivatives Rash    Syncope  Consultations: None   Procedures/Studies: CT ABDOMEN PELVIS W CONTRAST  Result Date: 01/15/2023 CLINICAL DATA:  Abdominal pain. EXAM: CT ABDOMEN AND PELVIS WITH CONTRAST TECHNIQUE: Multidetector CT imaging of the abdomen and pelvis was performed using the standard protocol following bolus administration of intravenous contrast. RADIATION DOSE REDUCTION: This exam was performed according to the departmental dose-optimization program which includes automated exposure control, adjustment of the mA and/or kV according to patient size and/or use of iterative reconstruction technique. CONTRAST:  OMNIPAQUE IOHEXOL 300 MG/ML  SOLN COMPARISON:  CT chest abdomen pelvis dated 06/17/2022. FINDINGS: Lower chest: The visualized lung bases are clear. No intra-abdominal free air or free fluid. Hepatobiliary: The liver is unremarkable. Mild biliary ductal  dilatation, post cholecystectomy. No retained calcified stone noted in the central CBD. Pancreas: Unremarkable. No pancreatic ductal dilatation or surrounding inflammatory changes. Spleen: Normal in size without focal abnormality. Adrenals/Urinary Tract: The adrenal glands are unremarkable. There is no hydronephrosis on either side. There is symmetric enhancement and excretion of contrast by both kidneys. Small left renal upper pole cyst. The visualized ureters appear unremarkable. The urinary bladder is minimally distended with excreted contrast. Stomach/Bowel: Postsurgical changes of gastric sleeve. There is mild distal colonic diverticulosis without active inflammatory changes. There is no bowel obstruction or active inflammation. The appendix is not visualized, likely surgically absent. Vascular/Lymphatic: The abdominal aorta is unremarkable. An infrarenal IVC filter noted. No portal venous gas. There is no adenopathy. Reproductive: The uterus is grossly unremarkable. No adnexal masses. Other: Midline vertical anterior pelvic wall incisional scar. Ventral hernia repair mesh. Musculoskeletal: Lower lumbar posterior fusion hardware. Degenerative changes of the spine. No acute osseous pathology. IMPRESSION: 1. No acute intra-abdominal or pelvic pathology. 2. Mild distal colonic diverticulosis. No bowel obstruction. Electronically Signed   By: Elgie Collard M.D.   On: 01/15/2023 20:43   DG CHEST PORT 1 VIEW  Result Date: 01/15/2023 CLINICAL DATA:  Cough, sinus infection. Nausea, weakness, and diarrhea. EXAM: PORTABLE CHEST 1 VIEW COMPARISON:  06/17/2022. FINDINGS: The heart is enlarged and the mediastinal contour is within normal limits. Airspace disease is noted in the suprahilar and infrahilar regions on the left. There is chronic elevation of the right diaphragm. No effusion or pneumothorax. No acute osseous abnormality. IMPRESSION: Perihilar airspace disease on the left, possible atelectasis or  infiltrate. Electronically Signed   By: Thornell Sartorius M.D.   On: 01/15/2023 20:16   CT ANGIO HEAD NECK W WO CM  Result Date: 01/15/2023 CLINICAL DATA:  Neuro deficit, acute, stroke suspected. EXAM: CT ANGIOGRAPHY HEAD AND NECK WITH AND WITHOUT CONTRAST TECHNIQUE: Multidetector CT imaging of the head and neck was performed using the standard protocol during bolus administration of intravenous contrast. Multiplanar CT image reconstructions and MIPs were obtained to evaluate the vascular anatomy. Carotid stenosis measurements (when applicable) are obtained utilizing NASCET criteria, using the distal internal carotid diameter as the denominator. RADIATION DOSE REDUCTION: This exam was performed according to the departmental dose-optimization program which includes automated exposure control, adjustment of the mA and/or kV according to patient size and/or use of iterative reconstruction technique. CONTRAST:  75mL OMNIPAQUE IOHEXOL 350 MG/ML SOLN COMPARISON:  MRI same day FINDINGS: CT HEAD FINDINGS The examination suffers from considerable artifactual degradation. As seen, there is no evidence of stroke, mass, hemorrhage, hydrocephalus or extra-axial collection, but detail is quite limited in the examination is not diagnostically sufficient. Thankfully, the angiography portion of the exam is of good quality. CTA NECK FINDINGS Aortic arch: Normal Right carotid system: Common  carotid artery widely patent to the bifurcation. Carotid bifurcation is normal without soft or calcified plaque. Cervical ICA is normal. Left carotid system: Left carotid system similarly normal. Vertebral arteries: Both vertebral artery origins are widely patent. Both vessels appear normal through the cervical region to the foramen magnum. Skeleton: Chronic degenerative spondylosis. No acute or significant finding. Other neck: No mass or lymphadenopathy. Upper chest: Abnormal patchy density in the left upper lobe that could represent  bronchopneumonia. Review of the MIP images confirms the above findings CTA HEAD FINDINGS Anterior circulation: Both internal carotid arteries are patent through the skull base and siphon regions. No siphon stenosis. Anterior and middle cerebral arteries are normal. No large vessel occlusion or proximal stenosis. No aneurysm or vascular malformation. Posterior circulation: Both vertebral arteries widely patent through the foramen magnum to the basilar artery. No basilar stenosis. Posterior circulation branch vessels are normal. Venous sinuses: Patent and normal. Anatomic variants: None significant. Review of the MIP images confirms the above findings IMPRESSION: 1. No large vessel occlusion or proximal stenosis. Normal appearance of the intracranial vessels. 2. No carotid bifurcation disease. 3. Abnormal patchy density in the left upper lobe that could represent bronchopneumonia. 4. The head CT portion of the examination suffers from considerable artifactual degradation of image quality. As seen, there is no evidence of stroke, mass, hemorrhage, hydrocephalus or extra-axial collection, but detail is quite limited in the examination is not diagnostically sufficient. Electronically Signed   By: Paulina Fusi M.D.   On: 01/15/2023 18:07   MR Brain W and Wo Contrast  Result Date: 01/15/2023 CLINICAL DATA:  No deficit, acute, stroke suspected. EXAM: MRI HEAD WITHOUT AND WITH CONTRAST TECHNIQUE: Multiplanar, multiecho pulse sequences of the brain and surrounding structures were obtained without and with intravenous contrast. CONTRAST:  10mL GADAVIST GADOBUTROL 1 MMOL/ML IV SOLN COMPARISON:  Head CT 06/17/2022.  MRI 06/15/2018 FINDINGS: Brain: Diffusion imaging does not show any acute or subacute infarction. Chronic small-vessel ischemic changes are seen throughout the pons. No focal cerebellar insult. Cerebral hemispheres show chronic small-vessel ischemic changes of the thalami and hemispheric white matter, mildly  progressive since 2020. No cortical or large vessel territory infarction. No mass lesion, hemorrhage, hydrocephalus or extra-axial collection. After contrast administration, no abnormal enhancement occurs. Vascular: Major vessels at the base of the brain show flow. Skull and upper cervical spine: Negative Sinuses/Orbits: Clear/normal Other: None IMPRESSION: No acute or reversible finding. Chronic small-vessel ischemic changes of the pons, thalami and hemispheric white matter, mildly progressive since 2020. Electronically Signed   By: Paulina Fusi M.D.   On: 01/15/2023 15:08   (Echo, Carotid, EGD, Colonoscopy, ERCP)    Subjective: No complaints.  Discharge Exam: Vitals:   01/17/23 2007 01/18/23 0555  BP: 135/72 106/69  Pulse: (!) 58 66  Resp: 17 17  Temp: 98.1 F (36.7 C) 98 F (36.7 C)  SpO2: 98% 94%   Vitals:   01/17/23 0749 01/17/23 1233 01/17/23 2007 01/18/23 0555  BP: (!) 133/97 (!) 140/76 135/72 106/69  Pulse: 61 (!) 57 (!) 58 66  Resp: 17 17 17 17   Temp: 98.3 F (36.8 C) 98.4 F (36.9 C) 98.1 F (36.7 C) 98 F (36.7 C)  TempSrc: Oral Oral Oral Oral  SpO2: 98% 98% 98% 94%  Weight:      Height:        General: Pt is alert, awake, not in acute distress Cardiovascular: RRR, S1/S2 +, no rubs, no gallops Respiratory: CTA bilaterally, no wheezing, no rhonchi Abdominal: Soft,  NT, ND, bowel sounds + Extremities: no edema, no cyanosis    The results of significant diagnostics from this hospitalization (including imaging, microbiology, ancillary and laboratory) are listed below for reference.     Microbiology: Recent Results (from the past 240 hour(s))  SARS Coronavirus 2 by RT PCR (hospital order, performed in [facility] hospital lab) *cepheid single result test* Anterior Nasal Swab     Status: Abnormal   Collection Time: 01/15/23  7:53 PM   Specimen: Anterior Nasal Swab  Result Value Ref Range Status   SARS Coronavirus 2 by RT PCR POSITIVE (A) NEGATIVE Final     Comment: (NOTE) SARS-CoV-2 target nucleic acids are DETECTED  SARS-CoV-2 RNA is generally detectable in upper respiratory specimens  during the acute phase of infection.  Positive results are indicative  of the presence of the identified virus, but do not rule out bacterial infection or co-infection with other pathogens not detected by the test.  Clinical correlation with patient history and  other diagnostic information is necessary to determine patient infection status.  The expected result is negative.  Fact Sheet for Patients:   RoadLapTop.co.za   Fact Sheet for Healthcare Providers:   http://kim-miller.com/    This test is not yet approved or cleared by the [place] FDA and  has been authorized for detection and/or diagnosis of SARS-CoV-2 by FDA under an Emergency Use Authorization (EUA).  This EUA will remain in effect (meaning this test can be used) for the duration of  the COVID-19 declaration under Section 564(b)(1)  of the Act, 21 U.S.C. section 360-bbb-3(b)(1), unless the authorization is terminated or revoked sooner.   Performed at [facility], [address], [place], [place] [address]   Culture, blood (Routine X 2) w Reflex to ID Panel     Status: None (Preliminary result)   Collection Time: 01/15/23 11:44 PM   Specimen: BLOOD  Result Value Ref Range Status   Specimen Description   Final    BLOOD BLOOD RIGHT HAND Performed at [facility], [address] [place], [place] [address]    Special Requests   Final    Blood Culture adequate volume BOTTLES DRAWN AEROBIC ONLY Performed at [facility], [address], [place], [place] [address]    Culture   Final    NO GROWTH 2 DAYS Performed at [facility], [address], [place], [place] [address]    Report Status PENDING  Incomplete  Culture, blood (Routine X 2) w Reflex to ID Panel     Status: None  (Preliminary result)   Collection Time: 01/15/23 11:46 PM   Specimen: BLOOD LEFT HAND  Result Value Ref Range Status   Specimen Description   Final    BLOOD LEFT HAND AEROBIC BOTTLE ONLY Performed at [facility], [address], [place], [place] [address]    Special Requests   Final    Blood Culture adequate volume BOTTLES DRAWN AEROBIC ONLY Performed at [facility], [address] [place], [place] [address]    Culture   Final    NO GROWTH 2 DAYS Performed at [facility], [address], [place], [place] [address]    Report Status PENDING  Incomplete     Labs: BNP (last 3 results) No results for input(s): "BNP" in the last 8760 hours. Basic Metabolic Panel: Recent Labs  Lab 01/15/23 1315 01/15/23 1923 01/15/23 2111 01/16/23 0908 01/17/23 0450 01/18/23 0601  NA 141  --  142 143 144 138  K 3.5  --  3.4* 4.2 4.0 4.0  CL 112*  --  112* 113* 115* 113*  CO2 21*  --  27 23 21* 19*  GLUCOSE 116*  --  90 203* 92 89  BUN 12  --  10 9 13 14   CREATININE 0.64  --  0.77 0.75 0.92 0.79  CALCIUM 8.4*  --  8.3* 8.5* 8.4* 8.4*  MG  --  2.1  --  2.1  --   --   PHOS  --   --   --  2.9  --   --    Liver Function Tests: Recent Labs  Lab 01/15/23 2111 01/16/23 0908 01/17/23 0450 01/18/23 0601  AST 716* 416* 170* 93*  ALT 252* 276* 198* 157*  ALKPHOS 188* 208* 165* 135*  BILITOT 1.1 0.9 0.5 0.5  PROT 6.3* 6.8 5.9* 5.8*  ALBUMIN 3.0* 3.1* 2.7* 2.7*   Recent Labs  Lab 01/15/23 2344  LIPASE 31   Recent Labs  Lab 01/15/23 2344  AMMONIA 52*   CBC: Recent Labs  Lab 01/15/23 1315 01/16/23 0908 01/17/23 0841  WBC 8.2 2.4* 6.8  NEUTROABS 5.9  --  3.3  HGB 11.6* 11.8* 10.5*  HCT 37.0 38.5 34.7*  MCV 83.7 85.2 85.3  PLT 345 347 335   Cardiac Enzymes: Recent Labs  Lab 01/15/23 2111  CKTOTAL 63   BNP: Invalid input(s): "POCBNP" CBG: Recent Labs  Lab 01/16/23 0325  GLUCAP 134*   D-Dimer No results for input(s): "DDIMER" in the  last 72 hours. Hgb A1c No results for input(s): "HGBA1C" in the last 72 hours. Lipid Profile No results for input(s): "CHOL", "HDL", "LDLCALC", "TRIG", "CHOLHDL", "LDLDIRECT" in the last 72 hours. Thyroid function studies Recent Labs    01/15/23 2111  TSH 0.396   Anemia work up Recent Labs    01/15/23 2111 01/16/23 0908  VITAMINB12  --  4,853*  FOLATE  --  15.2  FERRITIN 189  --   TIBC 333  --   IRON 57  --   RETICCTPCT 1.0  --    Urinalysis    Component Value Date/Time   COLORURINE YELLOW 06/21/2022 0831   APPEARANCEUR CLEAR 06/21/2022 0831   LABSPEC 1.012 06/21/2022 0831   PHURINE 5.0 06/21/2022 0831   GLUCOSEU NEGATIVE 06/21/2022 0831   GLUCOSEU NEGATIVE 06/23/2021 1425   HGBUR NEGATIVE 06/21/2022 0831   HGBUR trace-intact 07/25/2007 1629   BILIRUBINUR NEGATIVE 06/21/2022 0831   BILIRUBINUR 2 02/02/2022 1514   KETONESUR NEGATIVE 06/21/2022 0831   PROTEINUR NEGATIVE 06/21/2022 0831   UROBILINOGEN 1.0 02/02/2022 1514   UROBILINOGEN 0.2 06/23/2021 1425   NITRITE NEGATIVE 06/21/2022 0831   LEUKOCYTESUR MODERATE (A) 06/21/2022 0831   Sepsis Labs Recent Labs  Lab 01/15/23 1315 01/16/23 0908 01/17/23 0841  WBC 8.2 2.4* 6.8   Microbiology Recent Results (from the past 240 hour(s))  SARS Coronavirus 2 by RT PCR (hospital order, performed in [facility] hospital lab) *cepheid single result test* Anterior Nasal Swab     Status: Abnormal   Collection Time: 01/15/23  7:53 PM   Specimen: Anterior Nasal Swab  Result Value Ref Range Status   SARS Coronavirus 2 by RT PCR POSITIVE (A) NEGATIVE Final    Comment: (NOTE) SARS-CoV-2 target nucleic acids are DETECTED  SARS-CoV-2 RNA is generally detectable in upper respiratory specimens  during the acute phase of infection.  Positive results are indicative  of the presence  of the identified virus, but do not rule out bacterial infection or co-infection with other pathogens not detected by the test.  Clinical correlation  with patient history and  other diagnostic information is necessary to determine patient infection status.  The expected result is negative.  Fact Sheet for Patients:   RoadLapTop.co.za   Fact Sheet for Healthcare Providers:   http://kim-miller.com/    This test is not yet approved or cleared by the [place] FDA and  has been authorized for detection and/or diagnosis of SARS-CoV-2 by FDA under an Emergency Use Authorization (EUA).  This EUA will remain in effect (meaning this test can be used) for the duration of  the COVID-19 declaration under Section 564(b)(1)  of the Act, 21 U.S.C. section 360-bbb-3(b)(1), unless the authorization is terminated or revoked sooner.   Performed at [facility], [address], [place], [place] [address]   Culture, blood (Routine X 2) w Reflex to ID Panel     Status: None (Preliminary result)   Collection Time: 01/15/23 11:44 PM   Specimen: BLOOD  Result Value Ref Range Status   Specimen Description   Final    BLOOD BLOOD RIGHT HAND Performed at [facility], [address], [place], [place] [address]    Special Requests   Final    Blood Culture adequate volume BOTTLES DRAWN AEROBIC ONLY Performed at [facility], [address] [place], [place] [address]    Culture   Final    NO GROWTH 2 DAYS Performed at [facility], [address], [place], [place] [address]    Report Status PENDING  Incomplete  Culture, blood (Routine X 2) w Reflex to ID Panel     Status: None (Preliminary result)   Collection Time: 01/15/23 11:46 PM   Specimen: BLOOD LEFT HAND  Result Value Ref Range Status   Specimen Description   Final    BLOOD LEFT HAND AEROBIC BOTTLE ONLY Performed at [facility], [address], [place], [place] [address]    Special Requests   Final    Blood Culture adequate volume BOTTLES DRAWN AEROBIC ONLY Performed at  [facility], [address], [place], [place] [address]    Culture   Final    NO GROWTH 2 DAYS Performed at [facility], [address], [place], [place] [address]    Report Status PENDING  Incomplete     Time coordinating discharge: Over 35 minutes  SIGNED:   Marinda Elk, MD  [facility] 01/18/2023, 9:18 AM Pager   If 7PM-7AM, please contact night-coverage [email or web address] Password [email or web address]

## 2023-01-18 NOTE — Progress Notes (Signed)
Physical Therapy Treatment Patient Details Name: [name] MRN: [id number] DOB: 28-Oct-1955 Today's Date: 01/18/2023   History of Present Illness Pt is a 67 year old female who presented with vertigo started 3 days prior to admission with nausea and vomiting and admitted 01/15/23 for Diarrhea/abdominal tenderness/elevated LFTs.  Also found to be Covid positive.  PMH anemia, anxiety, OA, asthma, CHF, chronic back pain, DM, hx DVT s/p IVC filter, dysrhythmia, fibro, peripheral neuropathy, hernia repair, R knee ligament repair, lumbar fusion, exocrine pancreatic insufficiency, frequent UTI, C. difficile, essential hypertension, chronic diastolic heart failure, gastric bypass    PT Comments   Pt admitted with above diagnosis.  Pt currently with functional limitations due to the deficits listed below (see PT Problem List). Pt seated EOB when PT arrived. Pt agreeable to therapy intervention. Pt reported nursing staff assisted pt to EOB. PT reviewed notes from prior PT and OT intervention and some concern per syncope like episode.  PT monitored Bp and pt response throughout intervention, please see below. Pt required min A for sit to stand from EOB with min cues for proper UE placement, pt required B UE support and CGA for static standing at RW with cues for encouragement and extension posture. Pt indicated need to sit down s/p ~ 4 mins and PT obtaining Bp. Once seated EOB pt exhibited signs and symptoms of LOC/syncope with eyes fluttering, sudden cervical flexion and inability to verbally respond to therapist. Nurse immediatly notified. Pt had no apparent recollection of PT and rehab tech in room, unable to communicate name or location. Pt required mod A x 2 for sit to supine, once in supine pts eyes closed and noted what appeared to be rapid eye movement and pt reached L UE up toward the ceiling. Nurse aware. Bp assessed on additional time in supine and then pt positioned in semi reclined and all needs in  place. Prior to PT exiting the room pt was able to use contact clues in room to communicate she was in the hospital, asked specific hospital replied with [facility],  pt oriented to location at [facility], pt able to communicate her name and when PT inquired what pt had just done with PT pt stated you were here to bring me my lunch. PT is unclear if pt is self aware of physiological response, current limitations or able to recall events leading to near syncope/LOC episodes and is motivated to return to personal home alone, however PT strongly feels pt will benefit from continued inpatient follow up therapy, <3 hours/day at time of d/c.  Pt will benefit from acute skilled PT to increase their independence and safety with mobility to allow discharge.    Bp at rest seated EOB 158/85 (61 PR) Bp initial standing 127/91 (75 PR) Bp s/p 3 mins standing 175/103 (74 PR) Bp once returned to supine 174/91 (55 PR)   If plan is discharge home, recommend the following: Two people to help with walking and/or transfers;Two people to help with bathing/dressing/bathroom;Assistance with cooking/housework;Help with stairs or ramp for entrance   Can travel by private vehicle     No  Equipment Recommendations  None recommended by PT    Recommendations for Other Services       Precautions / Restrictions Precautions Precautions: Fall (monitor Bp) Restrictions Weight Bearing Restrictions: No     Mobility  Bed Mobility Overal bed mobility: Needs Assistance Bed Mobility: Sit to Supine       Sit to supine: Mod assist, +2 for safety/equipment, +  2 for physical assistance (to flat surface)   General bed mobility comments: pt seated EOB when PT and rehab tech arrived. pt indicated nursing had assisted to her to EOB. pt required increased assist for supine to sit due to apparent change in cognition and abiltiy to follow commands as well as self awareness s/p standing    Transfers Overall transfer level: Needs  assistance Equipment used: Rolling walker (2 wheels) Transfers: Sit to/from Stand Sit to Stand: +2 safety/equipment, Min assist           General transfer comment: min cues for push to stand from EOB, cues for extension posture once in standing pt able to tolerate 4 mins standing with CGA and B UE support at [facility]    Ambulation/Gait               General Gait Details: NT due to syncope like episodes resulting in a change in cognition orientation and awareness   Stairs             Wheelchair Mobility     Tilt Bed    Modified Rankin (Stroke Patients Only)       Balance Overall balance assessment: Needs assistance         Standing balance support: Reliant on assistive device for balance, Bilateral upper extremity supported, During functional activity Standing balance-Leahy Scale: Poor                              Cognition Arousal: Stuporous   Overall Cognitive Status: No family/caregiver present to determine baseline cognitive functioning                                          Exercises      General Comments        Pertinent Vitals/Pain Pain Assessment Pain Assessment: Faces Faces Pain Scale: Hurts even more Pain Location: head and neck Pain Descriptors / Indicators: Headache, Grimacing Pain Intervention(s): Limited activity within patient's tolerance, Monitored during session    Home Living Family/patient expects to be discharged to:: Private residence Living Arrangements: Alone Available Help at Discharge: Family;Available PRN/intermittently Type of Home: House Home Access: Ramped entrance       Home Layout: One level Home Equipment: Rollator (4 wheels);Cane - single point;Shower seat - built in;Cane - quad Additional Comments: was using SPC most of the time; recent fall not sure what happened but generally tripped on a chair 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, broadcasting/film/video); has adjustable bed at  home (head only); wears glasses at all times but vision has gotten worse recently can't see as well at night    Prior Function            PT Goals (current goals can now be found in the care plan section) Acute Rehab PT Goals PT Goal Formulation: Patient unable to participate in goal setting Time For Goal Achievement: 01/30/23 Potential to Achieve Goals: Good    Frequency    Min 1X/week      PT Plan      Co-evaluation              AM-PAC PT "6 Clicks" Mobility   Outcome Measure  Help needed turning from your back to your side while in a flat bed without using bedrails?: A Little Help needed moving from lying on  your back to sitting on the side of a flat bed without using bedrails?: A Little Help needed moving to and from a bed to a chair (including a wheelchair)?: A Little Help needed standing up from a chair using your arms (e.g., wheelchair or bedside chair)?: A Little Help needed to walk in hospital room?: Total Help needed climbing 3-5 steps with a railing? : Total 6 Click Score: 14    End of Session Equipment Utilized During Treatment: Gait belt Activity Tolerance: Treatment limited secondary to medical complications (Comment) Patient left: in bed;with call bell/phone within reach;with bed alarm set (RN in room) Nurse Communication: Mobility status;Other (comment) (pt physiological response to standing and BP fidings) PT Visit Diagnosis: Difficulty in walking, not elsewhere classified (R26.2);Muscle weakness (generalized) (M62.81)     Time: 8295-6213 PT Time Calculation (min) (ACUTE ONLY): 24 min  Charges:    $Therapeutic Activity: 23-37 mins PT General Charges $$ ACUTE PT VISIT: 1 Visit                     [name], PT Acute Rehab    Jacqualyn Posey 01/18/2023, 3:19 PM

## 2023-01-18 NOTE — Plan of Care (Signed)

## 2023-01-19 LAB — COMPREHENSIVE METABOLIC PANEL
ALT: 133 U/L — ABNORMAL HIGH (ref 0–44)
AST: 78 U/L — ABNORMAL HIGH (ref 15–41)
Albumin: 2.8 g/dL — ABNORMAL LOW (ref 3.5–5.0)
Alkaline Phosphatase: 140 U/L — ABNORMAL HIGH (ref 38–126)
Anion gap: 8 (ref 5–15)
BUN: 12 mg/dL (ref 8–23)
CO2: 21 mmol/L — ABNORMAL LOW (ref 22–32)
Calcium: 8.7 mg/dL — ABNORMAL LOW (ref 8.9–10.3)
Chloride: 110 mmol/L (ref 98–111)
Creatinine, Ser: 0.7 mg/dL (ref 0.44–1.00)
GFR, Estimated: >60 mL/min (ref >60)
Glucose, Bld: 82 mg/dL (ref 70–99)
Potassium: 4.7 mmol/L (ref 3.5–5.1)
Sodium: 139 mmol/L (ref 135–145)
Total Bilirubin: 0.5 mg/dL (ref 0.3–1.2)
Total Protein: 6 g/dL — ABNORMAL LOW (ref 6.5–8.1)

## 2023-01-19 NOTE — Plan of Care (Signed)
Pt is progressing 

## 2023-01-19 NOTE — Progress Notes (Addendum)
Pt seen and examined, plan for DC to SNF for short-term rehab -Discharge summary from yesterday per Dr. Robb Matar reviewed -She remains medically stable, history of intermittent dizziness/vertigo symptoms, on exam has nystagmus, will request vestibular eval per PT -Orthostatics were negative few days ago but clinical concerns for such on PT eval yesterday will repeat  Zannie Cove, MD

## 2023-01-19 NOTE — TOC Progression Note (Signed)
Transition of Care [facility]) - Progression Note    Patient Details  Name: [name] MRN: [id number] Date of Birth: 1955/08/18  Transition of Care [facility] CM/SW Contact  Otelia Santee, LCSW Phone Number: 01/19/2023, 9:40 AM  Clinical Narrative:    Pt not safe to return home. TOC will follow for pt progress to return home vs SNF following COVID isolation.    Expected Discharge Plan: Skilled Nursing Facility Barriers to Discharge: Barriers Resolved  Expected Discharge Plan and Services In-house Referral: Clinical Social Work Discharge Planning Services: NA Post Acute Care Choice: Skilled Nursing Facility Living arrangements for the past 2 months: Single Family Home Expected Discharge Date: 01/18/23               DME Arranged: N/A DME Agency: NA       HH Arranged: PT HH Agency: [facility] Date [facility] Agency Contacted: 01/18/23 Time HH Agency Contacted: 1119 Representative spoke with at [facility] Agency: Cindie   Social Determinants of Health (SDOH) Interventions SDOH Screenings   Food Insecurity: No Food Insecurity (01/15/2023)  Recent Concern: Food Insecurity - Food Insecurity Present (01/04/2023)  Housing: Low Risk  (01/15/2023)  Transportation Needs: No Transportation Needs (01/15/2023)  Recent Concern: Transportation Needs - Unmet Transportation Needs (12/31/2022)  Utilities: Not At Risk (01/15/2023)  Recent Concern: Utilities - At Risk (01/04/2023)  Alcohol Screen: Low Risk  (10/05/2022)  Depression (PHQ2-9): Low Risk  (10/05/2022)  Financial Resource Strain: Medium Risk (10/05/2022)  Physical Activity: Inactive (10/05/2022)  Social Connections: Moderately Isolated (10/05/2022)  Stress: No Stress Concern Present (10/05/2022)  Tobacco Use: Medium Risk (01/15/2023)    Readmission Risk Interventions    01/17/2023    2:53 PM  Readmission Risk Prevention Plan  Post Dischage Appt Complete  Medication Screening Complete  Transportation Screening Complete
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
## 2023-01-19 NOTE — Progress Notes (Signed)
Occupational Therapy Treatment Patient Details Name: [name] MRN: [id number] DOB: April 27, 1955 Today's Date: 01/19/2023   History of present illness Pt is a 67 year old female who presented with vertigo started 3 days prior to admission with nausea and vomiting and admitted 01/15/23 for Diarrhea/abdominal tenderness/elevated LFTs.  Also found to be Covid positive.  PMH anemia, anxiety, OA, asthma, CHF, chronic back pain, DM, hx DVT s/p IVC filter, dysrhythmia, fibro, peripheral neuropathy, hernia repair, R knee ligament repair, lumbar fusion, exocrine pancreatic insufficiency, frequent UTI, C. difficile, essential hypertension, chronic diastolic heart failure, gastric bypass   OT comments  Patient was noted to have increased near syncopal episode just sitting on EOB today with patient having decreased ability to verbalize and keep eyes focused. Patient appears to have continued decreased awareness of deficits with patient verbalizing understanding at end of session that transitioning home with current medical status was not safe. Patient will benefit from continued inpatient follow up therapy, <3 hours/day. Patient would continue to benefit from skilled OT services at this time while admitted and after d/c to address noted deficits in order to improve overall safety and independence in ADLs.     Blood pressures as noted in chart: Supine 154/91 mmhg Sitting 171/103 mmhg returned to bed  Supine after attempt 177/95 mmhg      If plan is discharge home, recommend the following:  Two people to help with bathing/dressing/bathroom;Two people to help with walking and/or transfers;Direct supervision/assist for financial management;Assist for transportation;Direct supervision/assist for medications management;Help with stairs or ramp for entrance;Supervision due to cognitive status   Equipment Recommendations  None recommended by OT       Precautions / Restrictions Precautions Precautions:  Fall Precaution Comments: severe vertigo, watch BP, will go pre-syncopal quickly Restrictions Weight Bearing Restrictions: No       Mobility Bed Mobility Overal bed mobility: Needs Assistance Bed Mobility: Supine to Sit     Supine to sit: Contact guard, Used rails, HOB elevated Sit to supine: Mod assist, +2 for physical assistance   General bed mobility comments: able to get to EOB well on a min guard basis from there when sitting EOB checking BP, become less responsive with eyes rolling back in head and required ModAx2 to return to supine         Balance Overall balance assessment: Needs assistance   Sitting balance-Leahy Scale: Fair           ADL either performed or assessed with clinical judgement   ADL Overall ADL's : Needs assistance/impaired   Eating/Feeding Details (indicate cue type and reason): patient was self feeding in sidelying with education on sititng upright. patient reported sidelying was the only way she could calm the dizziness. patient did have HOB raised in sidelying postioning.       General ADL Comments: patient was unable to progress to standing today with onset of less responsiveness with decreased ability to communicate verbally and eyes rolling around and upwards. patient appears to have decreased insight when this happens and decreased ability to remember when it is happening. patient was educated on how she was unable to progress safely to standing to day with increased occurances of less responsiveness. patient was educated on how this was not safe to transition home and that patient would need someone 24/7 caregiver to physically support her at current level. patient endoursed that sister is unable to offer that level of support. patient in agreement that she would need to safely move to be able  to transition home.      Cognition Arousal: Alert Behavior During Therapy: WFL for tasks assessed/performed, Flat affect Overall Cognitive Status:  No family/caregiver present to determine baseline cognitive functioning         General Comments: alert today but had intermittent periods of being obtunded during session and Max multimodal stimulation needed to get pt to focus on PT/OT and tasks at hand                   Pertinent Vitals/ Pain       Pain Assessment Pain Assessment: Faces Faces Pain Scale: Hurts even more Pain Location: head and neck Pain Descriptors / Indicators: Headache, Grimacing Pain Intervention(s): Limited activity within patient's tolerance, Monitored during session         Frequency  Min 1X/week        Progress Toward Goals  OT Goals(current goals can now be found in the care plan section)  Progress towards OT goals: Not progressing toward goals - comment (dizziness and pre syncopal episodes impacting progress)     Plan      Co-evaluation    PT/OT/SLP Co-Evaluation/Treatment: Yes Reason for Co-Treatment: To address functional/ADL transfers PT goals addressed during session: Mobility/safety with mobility OT goals addressed during session: ADL's and self-care      AM-PAC OT "6 Clicks" Daily Activity     Outcome Measure   Help from another person eating meals?: A Little Help from another person taking care of personal grooming?: A Lot Help from another person toileting, which includes using toliet, bedpan, or urinal?: Total Help from another person bathing (including washing, rinsing, drying)?: Total Help from another person to put on and taking off regular upper body clothing?: Total Help from another person to put on and taking off regular lower body clothing?: Total 6 Click Score: 9    End of Session    OT Visit Diagnosis: Unsteadiness on feet (R26.81);Other abnormalities of gait and mobility (R26.89);History of falling (Z91.81)   Activity Tolerance Treatment limited secondary to medical complications (Comment)   Patient Left in bed;with call bell/phone within reach;with  bed alarm set;with nursing/sitter in room   Nurse Communication Mobility status (MD made aware)        Time: 1610-9604 OT Time Calculation (min): 25 min  Charges: OT General Charges $OT Visit: 1 Visit OT Treatments $Therapeutic Activity: 8-22 mins  Rosalio Loud, MS Acute Rehabilitation Department Office# [phone]   Selinda Flavin 01/19/2023, 4:17 PM

## 2023-01-20 ENCOUNTER — Inpatient Hospital Stay (HOSPITAL_COMMUNITY): Payer: Medicare HMO

## 2023-01-20 DIAGNOSIS — R7401 Elevation of levels of liver transaminase levels: Secondary | ICD-10-CM | POA: Diagnosis not present

## 2023-01-20 DIAGNOSIS — G459 Transient cerebral ischemic attack, unspecified: Secondary | ICD-10-CM

## 2023-01-20 LAB — COMPREHENSIVE METABOLIC PANEL
ALT: 121 U/L — ABNORMAL HIGH (ref 0–44)
AST: 83 U/L — ABNORMAL HIGH (ref 15–41)
Albumin: 3 g/dL — ABNORMAL LOW (ref 3.5–5.0)
Alkaline Phosphatase: 135 U/L — ABNORMAL HIGH (ref 38–126)
Anion gap: 9 (ref 5–15)
BUN: 14 mg/dL (ref 8–23)
CO2: 23 mmol/L (ref 22–32)
Calcium: 8.8 mg/dL — ABNORMAL LOW (ref 8.9–10.3)
Chloride: 108 mmol/L (ref 98–111)
Creatinine, Ser: 0.8 mg/dL (ref 0.44–1.00)
GFR, Estimated: >60 mL/min (ref >60)
Glucose, Bld: 86 mg/dL (ref 70–99)
Potassium: 4.3 mmol/L (ref 3.5–5.1)
Sodium: 140 mmol/L (ref 135–145)
Total Bilirubin: 0.7 mg/dL (ref 0.3–1.2)
Total Protein: 6.1 g/dL — ABNORMAL LOW (ref 6.5–8.1)

## 2023-01-20 LAB — GLUCOSE, CAPILLARY
Glucose-Capillary: 147 mg/dL — ABNORMAL HIGH (ref 70–99)
Glucose-Capillary: 104 mg/dL — ABNORMAL HIGH (ref 70–99)

## 2023-01-20 MED ORDER — FENTANYL CITRATE PF 50 MCG/ML IJ SOSY
25.0000 ug | PREFILLED_SYRINGE | Freq: Once | INTRAMUSCULAR | Status: AC | PRN
  Administered 2023-01-21: 25 ug via INTRAVENOUS
  Filled 2023-01-20: qty 1

## 2023-01-20 MED ORDER — NALOXONE HCL 0.4 MG/ML IJ SOLN: 0.4000 mg | INTRAMUSCULAR | Status: DC | PRN

## 2023-01-20 MED ORDER — SODIUM CHLORIDE 0.9 % IV SOLN: INTRAVENOUS | Status: DC

## 2023-01-20 MED ORDER — ASPIRIN 81 MG PO TBEC
81.0000 mg | DELAYED_RELEASE_TABLET | Freq: Every day | ORAL | Status: DC
  Administered 2023-01-20 – 2023-01-25 (×6): 81 mg via ORAL
  Filled 2023-01-20 (×6): qty 1

## 2023-01-20 MED ORDER — TENECTEPLASE FOR STROKE: 25.0000 mg | PACK | Freq: Once | INTRAVENOUS | Status: DC

## 2023-01-20 MED ORDER — CLOPIDOGREL BISULFATE 75 MG PO TABS
300.0000 mg | ORAL_TABLET | Freq: Once | ORAL | Status: AC
  Administered 2023-01-20: 300 mg via ORAL
  Filled 2023-01-20: qty 4

## 2023-01-20 MED ORDER — IOHEXOL 350 MG/ML SOLN
75.0000 mL | Freq: Once | INTRAVENOUS | Status: AC | PRN
  Administered 2023-01-20: 75 mL via INTRAVENOUS

## 2023-01-20 MED ORDER — ACETAMINOPHEN 325 MG PO TABS
650.0000 mg | ORAL_TABLET | Freq: Four times a day (QID) | ORAL | Status: DC | PRN
  Filled 2023-01-20: qty 2

## 2023-01-20 MED ORDER — CHLORHEXIDINE GLUCONATE CLOTH 2 % EX PADS
6.0000 | MEDICATED_PAD | Freq: Every day | CUTANEOUS | Status: DC
  Administered 2023-01-20 – 2023-01-25 (×6): 6 via TOPICAL

## 2023-01-20 MED ORDER — CLOPIDOGREL BISULFATE 75 MG PO TABS
75.0000 mg | ORAL_TABLET | Freq: Every day | ORAL | Status: DC
  Administered 2023-01-21 – 2023-01-25 (×5): 75 mg via ORAL
  Filled 2023-01-20 (×5): qty 1

## 2023-01-20 MED ORDER — STROKE: EARLY STAGES OF RECOVERY BOOK
Freq: Once | Status: DC
  Filled 2023-01-20: qty 1

## 2023-01-20 NOTE — Progress Notes (Signed)
Inpatient Rehab Admissions Coordinator:   Consult received and chart reviewed. Therapy currently recommending lower intensity rehab, and poor tolerance for therapy yesterday due to dizziness.  I agree with SNF recommendations for further rehab.   Estill Dooms, PT, DPT Admissions Coordinator [phone] 01/20/23  3:37 PM
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
## 2023-01-20 NOTE — Significant Event (Addendum)
Rapid Response Event Note   Reason for Call :  Possible Stroke  Initial Focused Assessment:  Called to bedside for potential code stroke. On arrival patient aphasic, weakness on right side, right side neglect, unable to follow commands. CBG 147. Per bedside nurse patient was okay at 1445 and reassessed around 1535 noted alterations. Rapid response called at 1538 and arrived 1543. Code Stroke cart activated and emergency line called. Transported to CT Scanner by this RN, Retail banker and 5th Forensic psychologist. Gherge MD notified by bedside RN prior to Rapid response being called. Met staff in CT. Vitals throughout rapid response normal, with exception of BP. BP maintained 160-170s SBP with one exception into 200s SBP while patient crying and upset in scanner, NSR 80s-90s, SpO2 97 on Room air-MD at scanner and aware.   NIH score initially 11  While in CT scan with IV team placing Midline, patient symptoms began to resolve including not exhibiting right sided neglect, movement of right hand and leg and return of speech. Patient now oriented x4 in scanner.   Decision made to not administered TNK by Amada Jupiter MD-pharmacy obtained syringe of medication for disposal. Patient transported to SD 1235 to await tranfers to [facility] progressive unit   Interventions:  CODE STROKE order set placed per rapid response protocol  Plan of Care:  Transfer to SD while awaiting [facility] transfer as progressive patient.   Event Summary:   MD NotifiedLafe Garin MD by bedside RN prior to arrival  Call Time: 1538 Arrival Time: 1543 End Time: 1700  Rosaria Ferries, RN

## 2023-01-20 NOTE — Progress Notes (Signed)
TRH night cross cover note:   I was notified by RN that the patient is complaining of breakthrough left lower quadrant abdominal discomfort, and is unable to have acetaminophen in the setting of elevated LFTs.  She is not yet due for her next dose of prn tramadol.  I subsequently placed a one-time order for prn IV fentanyl for her abdominal discomfort.      Newton Pigg, DO Hospitalist
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
## 2023-01-20 NOTE — Progress Notes (Addendum)
Tech is not able to apply EEG leads.  Patient has a hair style that isn't allowing quality impedance for study.  Tech will defer to day shift  when she is able to have hair removed. Nurse and ordering provider notified.

## 2023-01-20 NOTE — Progress Notes (Signed)
Orthostatic blood pressure was ordered and vital signs as follows:   Lying: BP= 142/59 (85) HR=66   Sitting: BP=146/86 (105) HR=55  Standing: pt unable to stay in a standing position and complaints of light headedness.   Virgel Manifold, NP made aware.
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
## 2023-01-20 NOTE — Progress Notes (Signed)
Patient has arrived at [facility].  Tech will reach out to nursing staff for EEG availability.
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
## 2023-01-20 NOTE — Plan of Care (Signed)
  Problem: Health Behavior/Discharge Planning: Goal: Ability to manage health-related needs will improve Outcome: Progressing   Problem: Clinical Measurements: Goal: Ability to maintain clinical measurements within normal limits will improve Outcome: Progressing   

## 2023-01-20 NOTE — Progress Notes (Addendum)
Received a page at 3:40 this afternoon that there has been a change in the patient's mental status, she is completely aphasic with right-sided weakness.  Code stroke called right away, patient evaluated at bedside in radiology.  She appears to have right-sided neglect initially, with right-sided weakness, also appears aphasic.  Vitals pertinent for blood pressure in the 200s systolic.  Dr. Amada Jupiter with neurology running the code stroke via telemedicine.  Initial plans were for tPA administration of the patient's consent, however she started to improve, moving the right side and started talking.  tPA was canceled.  Stat CT scan without acute intracranial abnormalities.  CT angiogram without any LVO.  With improvement, no need for tPA.  Discussed with neurology, transferred patient to [facility] for neurology evaluation / stroke team input.   Patient reevaluated after arriving to [facility] stepdown upon transferring from radiology, she is alert and oriented, can tell me her name, year, location.  Strength is improving on the right side appears overall weak, but equal.  CRITICAL CARE Performed by: Pamella Pert   Total critical care time: 60 minutes, 3:50 pm - 4:50 pm, spent at bedside  Critical care time was exclusive of separately billable procedures and treating other patients.  Critical care was necessary to treat or prevent imminent or life-threatening deterioration.  Critical care was time spent personally by me on the following activities: development of treatment plan with patient and/or surrogate as well as nursing, discussions with consultants, evaluation of patient's response to treatment, examination of patient, obtaining history from patient or surrogate, ordering and performing treatments and interventions, ordering and review of laboratory studies, ordering and review of radiographic studies, pulse oximetry and re-evaluation of patient's condition.

## 2023-01-20 NOTE — Progress Notes (Signed)
Code stroke cart activated at 1545. Bedside RN stated recognition of symptoms at 1538.  Teleneurologist paged at 1547. Dr. Amada Jupiter on screen at 1552. Pt to CT at 1553.   Ricci Barker,  Tele stroke RN

## 2023-01-20 NOTE — Progress Notes (Signed)
Pt's BP=178/79 and complaints of head and neck pain but according to pt it is on and off for few days. Virgel Manifold, NP made aware thru secure chat. Awaiting further orders.

## 2023-01-21 ENCOUNTER — Inpatient Hospital Stay (HOSPITAL_COMMUNITY): Payer: Medicare HMO

## 2023-01-21 DIAGNOSIS — R569 Unspecified convulsions: Secondary | ICD-10-CM | POA: Diagnosis not present

## 2023-01-21 DIAGNOSIS — R4182 Altered mental status, unspecified: Secondary | ICD-10-CM

## 2023-01-21 DIAGNOSIS — G459 Transient cerebral ischemic attack, unspecified: Secondary | ICD-10-CM

## 2023-01-21 DIAGNOSIS — U071 COVID-19: Secondary | ICD-10-CM | POA: Diagnosis not present

## 2023-01-21 DIAGNOSIS — F449 Dissociative and conversion disorder, unspecified: Secondary | ICD-10-CM | POA: Diagnosis not present

## 2023-01-21 DIAGNOSIS — R7401 Elevation of levels of liver transaminase levels: Secondary | ICD-10-CM | POA: Diagnosis not present

## 2023-01-21 DIAGNOSIS — J189 Pneumonia, unspecified organism: Secondary | ICD-10-CM | POA: Diagnosis not present

## 2023-01-21 DIAGNOSIS — R10827 Generalized rebound abdominal tenderness: Secondary | ICD-10-CM | POA: Diagnosis not present

## 2023-01-21 LAB — CULTURE, BLOOD (ROUTINE X 2)
Culture: NO GROWTH
Culture: NO GROWTH
Special Requests: ADEQUATE
Special Requests: ADEQUATE

## 2023-01-21 LAB — ECHOCARDIOGRAM COMPLETE
AR max vel: 2.61 cm2
AV Area VTI: 2.64 cm2
AV Area mean vel: 2.59 cm2
AV Mean grad: 3 mm[Hg]
AV Peak grad: 6.2 mm[Hg]
Ao pk vel: 1.24 m/s
Area-P 1/2: 2.8 cm2
Height: 65 in
S' Lateral: 3.5 cm
Weight: 3952 [oz_av]

## 2023-01-21 LAB — LIPID PANEL
Cholesterol: 156 mg/dL (ref 0–200)
HDL: 57 mg/dL (ref >40)
LDL Cholesterol: 80 mg/dL (ref 0–99)
Total CHOL/HDL Ratio: 2.7 {ratio}
Triglycerides: 96 mg/dL (ref <150)
VLDL: 19 mg/dL (ref 0–40)

## 2023-01-21 LAB — AMMONIA: Ammonia: 30 umol/L (ref 9–35)

## 2023-01-21 LAB — HEMOGLOBIN A1C
Hgb A1c MFr Bld: 5.9 % — ABNORMAL HIGH (ref 4.8–5.6)
Mean Plasma Glucose: 122.63 mg/dL

## 2023-01-21 LAB — GLUCOSE, CAPILLARY: Glucose-Capillary: 89 mg/dL (ref 70–99)

## 2023-01-21 MED ORDER — DICLOFENAC SODIUM 1 % EX GEL
2.0000 g | Freq: Four times a day (QID) | CUTANEOUS | Status: DC
  Administered 2023-01-21 – 2023-01-25 (×14): 2 g via TOPICAL
  Filled 2023-01-21: qty 100

## 2023-01-21 MED ORDER — BENZONATATE 100 MG PO CAPS
100.0000 mg | ORAL_CAPSULE | Freq: Three times a day (TID) | ORAL | Status: DC | PRN
  Administered 2023-01-21 – 2023-01-24 (×4): 100 mg via ORAL
  Filled 2023-01-21 (×4): qty 1

## 2023-01-21 MED ORDER — OXYCODONE HCL 5 MG PO TABS
5.0000 mg | ORAL_TABLET | ORAL | Status: DC | PRN
  Administered 2023-01-21 – 2023-01-22 (×3): 5 mg via ORAL
  Filled 2023-01-21 (×3): qty 1
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
## 2023-01-21 NOTE — Progress Notes (Signed)
[id number]          Weight:       247.0 lb Date of Birth:  1956-03-07           BSA:          2.164 m Patient Age:    67 years            BP:           144/110 mmHg Patient Gender: F                    HR:           70 bpm. Exam Location:  Inpatient Procedure: 2D Echo, Cardiac Doppler and Color Doppler Indications:    TIA G45.9  History:        Patient has prior history of Echocardiogram examinations, most                 recent 01/14/2021. COVID; Risk Factors:Hypertension, Diabetes and                 Former Smoker.  Sonographer:    Dondra Prader RVT RCS Referring Phys: [phone] MCNEILL P KIRKPATRICK  Sonographer Comments: Technically challenging study due to limited acoustic windows, Technically difficult study due to poor echo windows, suboptimal parasternal window and suboptimal apical window. Image acquisition challenging due to patient body habitus. Unable to complete ECHO; patient unable to tolerate probe pressure. IMPRESSIONS  1. Left ventricular ejection fraction, by estimation, is 60 to 65%. The left ventricle has normal function. The left ventricle has no regional wall motion abnormalities. Left ventricular diastolic parameters are consistent with Grade I diastolic dysfunction (impaired relaxation).  2. Right ventricular systolic function is normal. The right ventricular size is normal.  3. The mitral valve is normal in structure. Trivial mitral valve regurgitation. No evidence of mitral stenosis.  4. Tricuspid valve regurgitation is mild to moderate.  5. The aortic valve has an indeterminant number of cusps. Aortic valve regurgitation is not visualized. No aortic stenosis is present.  6. There is borderline dilatation of the aortic root, measuring 37 mm.  7. The inferior vena cava is normal in size with greater than 50% respiratory variability, suggesting right atrial pressure of 3 mmHg. FINDINGS  Left Ventricle: Left ventricular ejection fraction, by estimation, is 60 to 65%. The left ventricle has normal function. The left ventricle has no regional wall motion abnormalities. The left ventricular internal cavity size was normal in size. There is  no left ventricular hypertrophy. Left ventricular  diastolic parameters are consistent with Grade I diastolic dysfunction (impaired relaxation). Right Ventricle: The right ventricular size is normal. No increase in right ventricular wall thickness. Right ventricular systolic function is normal. Left Atrium: Left atrial size was normal in size. Right Atrium: Right atrial size was normal in size. Pericardium: There is no evidence of pericardial effusion. Presence of epicardial fat layer. Mitral Valve: The mitral valve is normal in structure. Trivial mitral valve regurgitation. No evidence of mitral valve stenosis. Tricuspid Valve: The tricuspid valve is normal in structure. Tricuspid valve regurgitation is mild to moderate. No evidence of tricuspid stenosis. Aortic Valve: The aortic valve has an indeterminant number of cusps. Aortic valve regurgitation is not visualized. No aortic stenosis is present. Aortic valve mean gradient measures 3.0 mmHg. Aortic valve peak gradient measures 6.2 mmHg. Aortic valve area, by VTI measures 2.64 cm. Pulmonic Valve: The pulmonic valve was not well visualized. Pulmonic valve regurgitation is not visualized. No evidence  [id number]          Weight:       247.0 lb Date of Birth:  1956-03-07           BSA:          2.164 m Patient Age:    67 years            BP:           144/110 mmHg Patient Gender: F                    HR:           70 bpm. Exam Location:  Inpatient Procedure: 2D Echo, Cardiac Doppler and Color Doppler Indications:    TIA G45.9  History:        Patient has prior history of Echocardiogram examinations, most                 recent 01/14/2021. COVID; Risk Factors:Hypertension, Diabetes and                 Former Smoker.  Sonographer:    Dondra Prader RVT RCS Referring Phys: [phone] MCNEILL P KIRKPATRICK  Sonographer Comments: Technically challenging study due to limited acoustic windows, Technically difficult study due to poor echo windows, suboptimal parasternal window and suboptimal apical window. Image acquisition challenging due to patient body habitus. Unable to complete ECHO; patient unable to tolerate probe pressure. IMPRESSIONS  1. Left ventricular ejection fraction, by estimation, is 60 to 65%. The left ventricle has normal function. The left ventricle has no regional wall motion abnormalities. Left ventricular diastolic parameters are consistent with Grade I diastolic dysfunction (impaired relaxation).  2. Right ventricular systolic function is normal. The right ventricular size is normal.  3. The mitral valve is normal in structure. Trivial mitral valve regurgitation. No evidence of mitral stenosis.  4. Tricuspid valve regurgitation is mild to moderate.  5. The aortic valve has an indeterminant number of cusps. Aortic valve regurgitation is not visualized. No aortic stenosis is present.  6. There is borderline dilatation of the aortic root, measuring 37 mm.  7. The inferior vena cava is normal in size with greater than 50% respiratory variability, suggesting right atrial pressure of 3 mmHg. FINDINGS  Left Ventricle: Left ventricular ejection fraction, by estimation, is 60 to 65%. The left ventricle has normal function. The left ventricle has no regional wall motion abnormalities. The left ventricular internal cavity size was normal in size. There is  no left ventricular hypertrophy. Left ventricular  diastolic parameters are consistent with Grade I diastolic dysfunction (impaired relaxation). Right Ventricle: The right ventricular size is normal. No increase in right ventricular wall thickness. Right ventricular systolic function is normal. Left Atrium: Left atrial size was normal in size. Right Atrium: Right atrial size was normal in size. Pericardium: There is no evidence of pericardial effusion. Presence of epicardial fat layer. Mitral Valve: The mitral valve is normal in structure. Trivial mitral valve regurgitation. No evidence of mitral valve stenosis. Tricuspid Valve: The tricuspid valve is normal in structure. Tricuspid valve regurgitation is mild to moderate. No evidence of tricuspid stenosis. Aortic Valve: The aortic valve has an indeterminant number of cusps. Aortic valve regurgitation is not visualized. No aortic stenosis is present. Aortic valve mean gradient measures 3.0 mmHg. Aortic valve peak gradient measures 6.2 mmHg. Aortic valve area, by VTI measures 2.64 cm. Pulmonic Valve: The pulmonic valve was not well visualized. Pulmonic valve regurgitation is not visualized. No evidence  of Exam:   01/21/2023 Medical Rec #: [id number]            Accession #:    [id number] Date of Birth: [birth year]            Patient Gender: F Patient Age:   36 years Exam Location:  [facility] Procedure:      VAS [place] LOWER EXTREMITY VENOUS (DVT) Referring Phys: Scheryl Marten Marisha Renier --------------------------------------------------------------------------------  Other Indications: TIA. Limitations: Poor ultrasound/tissue interface, body habitus and Patient unable to tolerate compression maneuvers due to pain. Comparison Study: No significant changes seen since previous exam 12/05/18 Performing Technologist: Shona Simpson  Examination Guidelines: A complete evaluation includes B-mode imaging, spectral Doppler, color Doppler, and power Doppler as needed of all accessible portions of each vessel. Bilateral testing is considered an integral part of a complete examination. Limited examinations for reoccurring indications may be performed as noted. The reflux portion of the exam is performed with the  patient in reverse Trendelenburg.  +---------+---------------+---------+-----------+----------+---------------+ RIGHT    CompressibilityPhasicitySpontaneityPropertiesThrombus Aging  +---------+---------------+---------+-----------+----------+---------------+ CFV                     Yes      Yes                  Patent by color +---------+---------------+---------+-----------+----------+---------------+ SFJ                     Yes      Yes                  Patent by color +---------+---------------+---------+-----------+----------+---------------+ FV Prox                 Yes      Yes                  Patent by color +---------+---------------+---------+-----------+----------+---------------+ FV Mid                  Yes      Yes                  Patent by color +---------+---------------+---------+-----------+----------+---------------+ FV Distal               Yes      Yes                  Patent by color +---------+---------------+---------+-----------+----------+---------------+ PFV                     Yes      Yes                  Patent by color +---------+---------------+---------+-----------+----------+---------------+ POP                     Yes      Yes                  Patent by color +---------+---------------+---------+-----------+----------+---------------+ PTV      Full           Yes      Yes                                  +---------+---------------+---------+-----------+----------+---------------+ PERO                    Yes      Yes  [id number]          Weight:       247.0 lb Date of Birth:  1956-03-07           BSA:          2.164 m Patient Age:    67 years            BP:           144/110 mmHg Patient Gender: F                    HR:           70 bpm. Exam Location:  Inpatient Procedure: 2D Echo, Cardiac Doppler and Color Doppler Indications:    TIA G45.9  History:        Patient has prior history of Echocardiogram examinations, most                 recent 01/14/2021. COVID; Risk Factors:Hypertension, Diabetes and                 Former Smoker.  Sonographer:    Dondra Prader RVT RCS Referring Phys: [phone] MCNEILL P KIRKPATRICK  Sonographer Comments: Technically challenging study due to limited acoustic windows, Technically difficult study due to poor echo windows, suboptimal parasternal window and suboptimal apical window. Image acquisition challenging due to patient body habitus. Unable to complete ECHO; patient unable to tolerate probe pressure. IMPRESSIONS  1. Left ventricular ejection fraction, by estimation, is 60 to 65%. The left ventricle has normal function. The left ventricle has no regional wall motion abnormalities. Left ventricular diastolic parameters are consistent with Grade I diastolic dysfunction (impaired relaxation).  2. Right ventricular systolic function is normal. The right ventricular size is normal.  3. The mitral valve is normal in structure. Trivial mitral valve regurgitation. No evidence of mitral stenosis.  4. Tricuspid valve regurgitation is mild to moderate.  5. The aortic valve has an indeterminant number of cusps. Aortic valve regurgitation is not visualized. No aortic stenosis is present.  6. There is borderline dilatation of the aortic root, measuring 37 mm.  7. The inferior vena cava is normal in size with greater than 50% respiratory variability, suggesting right atrial pressure of 3 mmHg. FINDINGS  Left Ventricle: Left ventricular ejection fraction, by estimation, is 60 to 65%. The left ventricle has normal function. The left ventricle has no regional wall motion abnormalities. The left ventricular internal cavity size was normal in size. There is  no left ventricular hypertrophy. Left ventricular  diastolic parameters are consistent with Grade I diastolic dysfunction (impaired relaxation). Right Ventricle: The right ventricular size is normal. No increase in right ventricular wall thickness. Right ventricular systolic function is normal. Left Atrium: Left atrial size was normal in size. Right Atrium: Right atrial size was normal in size. Pericardium: There is no evidence of pericardial effusion. Presence of epicardial fat layer. Mitral Valve: The mitral valve is normal in structure. Trivial mitral valve regurgitation. No evidence of mitral valve stenosis. Tricuspid Valve: The tricuspid valve is normal in structure. Tricuspid valve regurgitation is mild to moderate. No evidence of tricuspid stenosis. Aortic Valve: The aortic valve has an indeterminant number of cusps. Aortic valve regurgitation is not visualized. No aortic stenosis is present. Aortic valve mean gradient measures 3.0 mmHg. Aortic valve peak gradient measures 6.2 mmHg. Aortic valve area, by VTI measures 2.64 cm. Pulmonic Valve: The pulmonic valve was not well visualized. Pulmonic valve regurgitation is not visualized. No evidence  of Exam:   01/21/2023 Medical Rec #: [id number]            Accession #:    [id number] Date of Birth: [birth year]            Patient Gender: F Patient Age:   36 years Exam Location:  [facility] Procedure:      VAS [place] LOWER EXTREMITY VENOUS (DVT) Referring Phys: Scheryl Marten Marisha Renier --------------------------------------------------------------------------------  Other Indications: TIA. Limitations: Poor ultrasound/tissue interface, body habitus and Patient unable to tolerate compression maneuvers due to pain. Comparison Study: No significant changes seen since previous exam 12/05/18 Performing Technologist: Shona Simpson  Examination Guidelines: A complete evaluation includes B-mode imaging, spectral Doppler, color Doppler, and power Doppler as needed of all accessible portions of each vessel. Bilateral testing is considered an integral part of a complete examination. Limited examinations for reoccurring indications may be performed as noted. The reflux portion of the exam is performed with the  patient in reverse Trendelenburg.  +---------+---------------+---------+-----------+----------+---------------+ RIGHT    CompressibilityPhasicitySpontaneityPropertiesThrombus Aging  +---------+---------------+---------+-----------+----------+---------------+ CFV                     Yes      Yes                  Patent by color +---------+---------------+---------+-----------+----------+---------------+ SFJ                     Yes      Yes                  Patent by color +---------+---------------+---------+-----------+----------+---------------+ FV Prox                 Yes      Yes                  Patent by color +---------+---------------+---------+-----------+----------+---------------+ FV Mid                  Yes      Yes                  Patent by color +---------+---------------+---------+-----------+----------+---------------+ FV Distal               Yes      Yes                  Patent by color +---------+---------------+---------+-----------+----------+---------------+ PFV                     Yes      Yes                  Patent by color +---------+---------------+---------+-----------+----------+---------------+ POP                     Yes      Yes                  Patent by color +---------+---------------+---------+-----------+----------+---------------+ PTV      Full           Yes      Yes                                  +---------+---------------+---------+-----------+----------+---------------+ PERO                    Yes      Yes  of Exam:   01/21/2023 Medical Rec #: [id number]            Accession #:    [id number] Date of Birth: [birth year]            Patient Gender: F Patient Age:   36 years Exam Location:  [facility] Procedure:      VAS [place] LOWER EXTREMITY VENOUS (DVT) Referring Phys: Scheryl Marten Marisha Renier --------------------------------------------------------------------------------  Other Indications: TIA. Limitations: Poor ultrasound/tissue interface, body habitus and Patient unable to tolerate compression maneuvers due to pain. Comparison Study: No significant changes seen since previous exam 12/05/18 Performing Technologist: Shona Simpson  Examination Guidelines: A complete evaluation includes B-mode imaging, spectral Doppler, color Doppler, and power Doppler as needed of all accessible portions of each vessel. Bilateral testing is considered an integral part of a complete examination. Limited examinations for reoccurring indications may be performed as noted. The reflux portion of the exam is performed with the  patient in reverse Trendelenburg.  +---------+---------------+---------+-----------+----------+---------------+ RIGHT    CompressibilityPhasicitySpontaneityPropertiesThrombus Aging  +---------+---------------+---------+-----------+----------+---------------+ CFV                     Yes      Yes                  Patent by color +---------+---------------+---------+-----------+----------+---------------+ SFJ                     Yes      Yes                  Patent by color +---------+---------------+---------+-----------+----------+---------------+ FV Prox                 Yes      Yes                  Patent by color +---------+---------------+---------+-----------+----------+---------------+ FV Mid                  Yes      Yes                  Patent by color +---------+---------------+---------+-----------+----------+---------------+ FV Distal               Yes      Yes                  Patent by color +---------+---------------+---------+-----------+----------+---------------+ PFV                     Yes      Yes                  Patent by color +---------+---------------+---------+-----------+----------+---------------+ POP                     Yes      Yes                  Patent by color +---------+---------------+---------+-----------+----------+---------------+ PTV      Full           Yes      Yes                                  +---------+---------------+---------+-----------+----------+---------------+ PERO                    Yes      Yes  [id number]          Weight:       247.0 lb Date of Birth:  1956-03-07           BSA:          2.164 m Patient Age:    67 years            BP:           144/110 mmHg Patient Gender: F                    HR:           70 bpm. Exam Location:  Inpatient Procedure: 2D Echo, Cardiac Doppler and Color Doppler Indications:    TIA G45.9  History:        Patient has prior history of Echocardiogram examinations, most                 recent 01/14/2021. COVID; Risk Factors:Hypertension, Diabetes and                 Former Smoker.  Sonographer:    Dondra Prader RVT RCS Referring Phys: [phone] MCNEILL P KIRKPATRICK  Sonographer Comments: Technically challenging study due to limited acoustic windows, Technically difficult study due to poor echo windows, suboptimal parasternal window and suboptimal apical window. Image acquisition challenging due to patient body habitus. Unable to complete ECHO; patient unable to tolerate probe pressure. IMPRESSIONS  1. Left ventricular ejection fraction, by estimation, is 60 to 65%. The left ventricle has normal function. The left ventricle has no regional wall motion abnormalities. Left ventricular diastolic parameters are consistent with Grade I diastolic dysfunction (impaired relaxation).  2. Right ventricular systolic function is normal. The right ventricular size is normal.  3. The mitral valve is normal in structure. Trivial mitral valve regurgitation. No evidence of mitral stenosis.  4. Tricuspid valve regurgitation is mild to moderate.  5. The aortic valve has an indeterminant number of cusps. Aortic valve regurgitation is not visualized. No aortic stenosis is present.  6. There is borderline dilatation of the aortic root, measuring 37 mm.  7. The inferior vena cava is normal in size with greater than 50% respiratory variability, suggesting right atrial pressure of 3 mmHg. FINDINGS  Left Ventricle: Left ventricular ejection fraction, by estimation, is 60 to 65%. The left ventricle has normal function. The left ventricle has no regional wall motion abnormalities. The left ventricular internal cavity size was normal in size. There is  no left ventricular hypertrophy. Left ventricular  diastolic parameters are consistent with Grade I diastolic dysfunction (impaired relaxation). Right Ventricle: The right ventricular size is normal. No increase in right ventricular wall thickness. Right ventricular systolic function is normal. Left Atrium: Left atrial size was normal in size. Right Atrium: Right atrial size was normal in size. Pericardium: There is no evidence of pericardial effusion. Presence of epicardial fat layer. Mitral Valve: The mitral valve is normal in structure. Trivial mitral valve regurgitation. No evidence of mitral valve stenosis. Tricuspid Valve: The tricuspid valve is normal in structure. Tricuspid valve regurgitation is mild to moderate. No evidence of tricuspid stenosis. Aortic Valve: The aortic valve has an indeterminant number of cusps. Aortic valve regurgitation is not visualized. No aortic stenosis is present. Aortic valve mean gradient measures 3.0 mmHg. Aortic valve peak gradient measures 6.2 mmHg. Aortic valve area, by VTI measures 2.64 cm. Pulmonic Valve: The pulmonic valve was not well visualized. Pulmonic valve regurgitation is not visualized. No evidence  of Exam:   01/21/2023 Medical Rec #: [id number]            Accession #:    [id number] Date of Birth: [birth year]            Patient Gender: F Patient Age:   36 years Exam Location:  [facility] Procedure:      VAS [place] LOWER EXTREMITY VENOUS (DVT) Referring Phys: Scheryl Marten Marisha Renier --------------------------------------------------------------------------------  Other Indications: TIA. Limitations: Poor ultrasound/tissue interface, body habitus and Patient unable to tolerate compression maneuvers due to pain. Comparison Study: No significant changes seen since previous exam 12/05/18 Performing Technologist: Shona Simpson  Examination Guidelines: A complete evaluation includes B-mode imaging, spectral Doppler, color Doppler, and power Doppler as needed of all accessible portions of each vessel. Bilateral testing is considered an integral part of a complete examination. Limited examinations for reoccurring indications may be performed as noted. The reflux portion of the exam is performed with the  patient in reverse Trendelenburg.  +---------+---------------+---------+-----------+----------+---------------+ RIGHT    CompressibilityPhasicitySpontaneityPropertiesThrombus Aging  +---------+---------------+---------+-----------+----------+---------------+ CFV                     Yes      Yes                  Patent by color +---------+---------------+---------+-----------+----------+---------------+ SFJ                     Yes      Yes                  Patent by color +---------+---------------+---------+-----------+----------+---------------+ FV Prox                 Yes      Yes                  Patent by color +---------+---------------+---------+-----------+----------+---------------+ FV Mid                  Yes      Yes                  Patent by color +---------+---------------+---------+-----------+----------+---------------+ FV Distal               Yes      Yes                  Patent by color +---------+---------------+---------+-----------+----------+---------------+ PFV                     Yes      Yes                  Patent by color +---------+---------------+---------+-----------+----------+---------------+ POP                     Yes      Yes                  Patent by color +---------+---------------+---------+-----------+----------+---------------+ PTV      Full           Yes      Yes                                  +---------+---------------+---------+-----------+----------+---------------+ PERO                    Yes      Yes  [id number]          Weight:       247.0 lb Date of Birth:  1956-03-07           BSA:          2.164 m Patient Age:    67 years            BP:           144/110 mmHg Patient Gender: F                    HR:           70 bpm. Exam Location:  Inpatient Procedure: 2D Echo, Cardiac Doppler and Color Doppler Indications:    TIA G45.9  History:        Patient has prior history of Echocardiogram examinations, most                 recent 01/14/2021. COVID; Risk Factors:Hypertension, Diabetes and                 Former Smoker.  Sonographer:    Dondra Prader RVT RCS Referring Phys: [phone] MCNEILL P KIRKPATRICK  Sonographer Comments: Technically challenging study due to limited acoustic windows, Technically difficult study due to poor echo windows, suboptimal parasternal window and suboptimal apical window. Image acquisition challenging due to patient body habitus. Unable to complete ECHO; patient unable to tolerate probe pressure. IMPRESSIONS  1. Left ventricular ejection fraction, by estimation, is 60 to 65%. The left ventricle has normal function. The left ventricle has no regional wall motion abnormalities. Left ventricular diastolic parameters are consistent with Grade I diastolic dysfunction (impaired relaxation).  2. Right ventricular systolic function is normal. The right ventricular size is normal.  3. The mitral valve is normal in structure. Trivial mitral valve regurgitation. No evidence of mitral stenosis.  4. Tricuspid valve regurgitation is mild to moderate.  5. The aortic valve has an indeterminant number of cusps. Aortic valve regurgitation is not visualized. No aortic stenosis is present.  6. There is borderline dilatation of the aortic root, measuring 37 mm.  7. The inferior vena cava is normal in size with greater than 50% respiratory variability, suggesting right atrial pressure of 3 mmHg. FINDINGS  Left Ventricle: Left ventricular ejection fraction, by estimation, is 60 to 65%. The left ventricle has normal function. The left ventricle has no regional wall motion abnormalities. The left ventricular internal cavity size was normal in size. There is  no left ventricular hypertrophy. Left ventricular  diastolic parameters are consistent with Grade I diastolic dysfunction (impaired relaxation). Right Ventricle: The right ventricular size is normal. No increase in right ventricular wall thickness. Right ventricular systolic function is normal. Left Atrium: Left atrial size was normal in size. Right Atrium: Right atrial size was normal in size. Pericardium: There is no evidence of pericardial effusion. Presence of epicardial fat layer. Mitral Valve: The mitral valve is normal in structure. Trivial mitral valve regurgitation. No evidence of mitral valve stenosis. Tricuspid Valve: The tricuspid valve is normal in structure. Tricuspid valve regurgitation is mild to moderate. No evidence of tricuspid stenosis. Aortic Valve: The aortic valve has an indeterminant number of cusps. Aortic valve regurgitation is not visualized. No aortic stenosis is present. Aortic valve mean gradient measures 3.0 mmHg. Aortic valve peak gradient measures 6.2 mmHg. Aortic valve area, by VTI measures 2.64 cm. Pulmonic Valve: The pulmonic valve was not well visualized. Pulmonic valve regurgitation is not visualized. No evidence  [id number]          Weight:       247.0 lb Date of Birth:  1956-03-07           BSA:          2.164 m Patient Age:    67 years            BP:           144/110 mmHg Patient Gender: F                    HR:           70 bpm. Exam Location:  Inpatient Procedure: 2D Echo, Cardiac Doppler and Color Doppler Indications:    TIA G45.9  History:        Patient has prior history of Echocardiogram examinations, most                 recent 01/14/2021. COVID; Risk Factors:Hypertension, Diabetes and                 Former Smoker.  Sonographer:    Dondra Prader RVT RCS Referring Phys: [phone] MCNEILL P KIRKPATRICK  Sonographer Comments: Technically challenging study due to limited acoustic windows, Technically difficult study due to poor echo windows, suboptimal parasternal window and suboptimal apical window. Image acquisition challenging due to patient body habitus. Unable to complete ECHO; patient unable to tolerate probe pressure. IMPRESSIONS  1. Left ventricular ejection fraction, by estimation, is 60 to 65%. The left ventricle has normal function. The left ventricle has no regional wall motion abnormalities. Left ventricular diastolic parameters are consistent with Grade I diastolic dysfunction (impaired relaxation).  2. Right ventricular systolic function is normal. The right ventricular size is normal.  3. The mitral valve is normal in structure. Trivial mitral valve regurgitation. No evidence of mitral stenosis.  4. Tricuspid valve regurgitation is mild to moderate.  5. The aortic valve has an indeterminant number of cusps. Aortic valve regurgitation is not visualized. No aortic stenosis is present.  6. There is borderline dilatation of the aortic root, measuring 37 mm.  7. The inferior vena cava is normal in size with greater than 50% respiratory variability, suggesting right atrial pressure of 3 mmHg. FINDINGS  Left Ventricle: Left ventricular ejection fraction, by estimation, is 60 to 65%. The left ventricle has normal function. The left ventricle has no regional wall motion abnormalities. The left ventricular internal cavity size was normal in size. There is  no left ventricular hypertrophy. Left ventricular  diastolic parameters are consistent with Grade I diastolic dysfunction (impaired relaxation). Right Ventricle: The right ventricular size is normal. No increase in right ventricular wall thickness. Right ventricular systolic function is normal. Left Atrium: Left atrial size was normal in size. Right Atrium: Right atrial size was normal in size. Pericardium: There is no evidence of pericardial effusion. Presence of epicardial fat layer. Mitral Valve: The mitral valve is normal in structure. Trivial mitral valve regurgitation. No evidence of mitral valve stenosis. Tricuspid Valve: The tricuspid valve is normal in structure. Tricuspid valve regurgitation is mild to moderate. No evidence of tricuspid stenosis. Aortic Valve: The aortic valve has an indeterminant number of cusps. Aortic valve regurgitation is not visualized. No aortic stenosis is present. Aortic valve mean gradient measures 3.0 mmHg. Aortic valve peak gradient measures 6.2 mmHg. Aortic valve area, by VTI measures 2.64 cm. Pulmonic Valve: The pulmonic valve was not well visualized. Pulmonic valve regurgitation is not visualized. No evidence
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
## 2023-01-21 NOTE — Plan of Care (Signed)
  Problem: Education: Goal: Knowledge of General Education information will improve Description: Including pain rating scale, medication(s)/side effects and non-pharmacologic comfort measures Outcome: Progressing   Problem: Health Behavior/Discharge Planning: Goal: Ability to manage health-related needs will improve Outcome: Progressing   Problem: Clinical Measurements: Goal: Ability to maintain clinical measurements within normal limits will improve Outcome: Progressing Goal: Will remain free from infection Outcome: Progressing Goal: Diagnostic test results will improve Outcome: Progressing Goal: Respiratory complications will improve Outcome: Progressing Goal: Cardiovascular complication will be avoided Outcome: Progressing   Problem: Activity: Goal: Risk for activity intolerance will decrease Outcome: Progressing   Problem: Nutrition: Goal: Adequate nutrition will be maintained Outcome: Progressing   Problem: Coping: Goal: Level of anxiety will decrease Outcome: Progressing   Problem: Elimination: Goal: Will not experience complications related to bowel motility Outcome: Progressing Goal: Will not experience complications related to urinary retention Outcome: Progressing   Problem: Safety: Goal: Ability to remain free from injury will improve Outcome: Progressing   Problem: Skin Integrity: Goal: Risk for impaired skin integrity will decrease Outcome: Progressing   Problem: Education: Goal: Knowledge of disease or condition will improve Outcome: Progressing Goal: Knowledge of secondary prevention will improve (MUST DOCUMENT ALL) Outcome: Progressing Goal: Knowledge of patient specific risk factors will improve Loraine Leriche N/A or DELETE if not current risk factor) Outcome: Progressing   Problem: Ischemic Stroke/TIA Tissue Perfusion: Goal: Complications of ischemic stroke/TIA will be minimized Outcome: Progressing   Problem: Coping: Goal: Will verbalize positive  feelings about self Outcome: Progressing Goal: Will identify appropriate support needs Outcome: Progressing   Problem: Health Behavior/Discharge Planning: Goal: Ability to manage health-related needs will improve Outcome: Progressing Goal: Goals will be collaboratively established with patient/family Outcome: Progressing   Problem: Self-Care: Goal: Ability to participate in self-care as condition permits will improve Outcome: Progressing Goal: Verbalization of feelings and concerns over difficulty with self-care will improve Outcome: Progressing Goal: Ability to communicate needs accurately will improve Outcome: Progressing   Problem: Nutrition: Goal: Risk of aspiration will decrease Outcome: Progressing Goal: Dietary intake will improve Outcome: Progressing

## 2023-01-21 NOTE — Progress Notes (Signed)
Lower extremity venous duplex completed. Please see CV Procedures for preliminary results.  Shona Simpson, RVT 01/21/23 4:28 PM

## 2023-01-21 NOTE — Progress Notes (Signed)
*  PRELIMINARY RESULTS* Echocardiogram 2D Echocardiogram has been performed.  [name] 01/21/2023, 2:49 PM

## 2023-01-21 NOTE — Procedures (Signed)
Patient Name: [name]  MRN: [id number]  Epilepsy Attending: Charlsie Quest  Referring Physician/Provider: Rejeana Brock, MD  Date: 01/21/2023 Duration: 25.12 mins  Patient history: 67yo F with ams getting eeg to evaluate for seizure  Level of alertness: Awake  AEDs during EEG study: None  Technical aspects: This EEG study was done with scalp electrodes positioned according to the 10-20 International system of electrode placement. Electrical activity was reviewed with band pass filter of 1-70Hz , sensitivity of 7 uV/mm, display speed of 2mm/sec with a 60Hz  notched filter applied as appropriate. EEG data were recorded continuously and digitally stored.  Video monitoring was available and reviewed as appropriate.  Description: The posterior dominant rhythm consists of 9 Hz activity of moderate voltage (25-35 uV) seen predominantly in posterior head regions, symmetric and reactive to eye opening and eye closing. Hyperventilation and photic stimulation were not performed.     Of note, study was technically difficult due to significant myogenic artifact.  IMPRESSION: This technically difficult  study is within normal limits. No seizures or epileptiform discharges were seen throughout the recording.  A normal interictal EEG does not exclude the diagnosis of epilepsy.  [name] [name]

## 2023-01-21 NOTE — Progress Notes (Signed)
EEG complete - results pending.  Tm/gmd

## 2023-01-21 NOTE — Evaluation (Signed)
Physical Therapy Re-Evaluation Patient Details Name: [name] MRN: [id number] DOB: [birth year] Today's Date: 01/21/2023  History of Present Illness  Pt is a 67 year old female who presented with vertigo started 3 days prior to admission with nausea and vomiting and admitted 01/15/23 for Diarrhea/abdominal tenderness/elevated LFTs.  Also found to be Covid positive.  10/3 code stroke called, transferred to [facility], possible TIA. PMH anemia, anxiety, OA, asthma, CHF, chronic back pain, DM, hx DVT s/p IVC filter, dysrhythmia, fibro, peripheral neuropathy, hernia repair, R knee ligament repair, lumbar fusion, exocrine pancreatic insufficiency, frequent UTI, C. difficile, essential hypertension, chronic diastolic heart failure, gastric bypass   Clinical Impression  Re-evaluated by PT following transfer to [facility] from [facility] for abrupt onset confusion, right leg weakness, inability to cross midline to the right with her eyes and right quadrantanopia. Pt required min assist for bed mobility and mod assist to stand with RW today. Tolerated <1 minute of standing before needing to sit due to fatigue. Pt oriented x4 however is not appropriately following simple motor commands for MMT and vestibular assessment. States she cannot lift her legs when seated at EOB but was able to fully bear weight and bring them in and out of the bed without support. Difficult to obtain further objective data at this time. With a limited assessment, I did not notice any resting or abnormal end range nystagmus with visual tracking. She initially had trouble with vertical gaze tracking but then this resolved during a second attempt. Do not feel she could tolerate CIR at this time and remains appropriate for SNF. Will continue to follow and progress during admission. Update recs as appropriate. Patient will continue to benefit from skilled physical therapy services to further improve independence with functional mobility.       If plan is  discharge home, recommend the following: Two people to help with walking and/or transfers;Two people to help with bathing/dressing/bathroom;Assistance with cooking/housework;Help with stairs or ramp for entrance   Can travel by private vehicle   No    Equipment Recommendations None recommended by PT (defer to next venue)  Recommendations for Other Services       Functional Status Assessment Patient has had a recent decline in their functional status and demonstrates the ability to make significant improvements in function in a reasonable and predictable amount of time.     Precautions / Restrictions Precautions Precautions: Fall Precaution Comments: severe vertigo, watch BP, will go pre-syncopal quickly Restrictions Weight Bearing Restrictions: No      Mobility  Bed Mobility Overal bed mobility: Needs Assistance Bed Mobility: Supine to Sit, Sit to Supine     Supine to sit: Used rails, Min assist, HOB elevated Sit to supine: Min assist, HOB elevated   General bed mobility comments: Min assist for trunk. Pt able to bring LEs into and out of bed without physical assistance.    Transfers Overall transfer level: Needs assistance Equipment used: Rolling walker (2 wheels) Transfers: Sit to/from Stand Sit to Stand: Mod assist           General transfer comment: Mod assist for boost to stand. VC for hand placement and technique. Leans heavily over RW until cued to stand upright.    Ambulation/Gait               General Gait Details: deferred  Stairs            Wheelchair Mobility     Tilt Bed    Modified Rankin (Stroke Patients  Only)       Balance Overall balance assessment: Needs assistance Sitting-balance support: No upper extremity supported, Feet supported Sitting balance-Leahy Scale: Fair Sitting balance - Comments: sat EOB at supervision level today   Standing balance support: Reliant on assistive device for balance, Bilateral upper  extremity supported, During functional activity Standing balance-Leahy Scale: Poor Standing balance comment: Reliant on RW                             Pertinent Vitals/Pain Pain Assessment Pain Assessment: Faces Faces Pain Scale: Hurts even more Pain Location: head and neck Pain Descriptors / Indicators: Headache, Grimacing Pain Intervention(s): Monitored during session, Repositioned, Patient requesting pain meds-RN notified    Home Living Family/patient expects to be discharged to:: Private residence Living Arrangements: Alone Available Help at Discharge: Family;Available PRN/intermittently Type of Home: House Home Access: Ramped entrance       Home Layout: One level Home Equipment: Rollator (4 wheels);Cane - single point;Shower seat - built in;Cane - quad Additional Comments: was using SPC most of the time; recent fall not sure what happened but generally tripped on a chair 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       Extremity/Trunk Assessment   Upper Extremity Assessment Upper Extremity Assessment: Defer to OT evaluation    Lower Extremity Assessment Lower Extremity Assessment: Difficult to assess due to impaired cognition (Not following instructions with MMT of LEs. Pt able to lift LEs out of and back into bed, but unable to extend knees or flex hips on command when sitting EOB.)    Cervical / Trunk Assessment Cervical / Trunk Assessment: Normal  Communication   Communication Communication: Difficulty communicating thoughts/reduced clarity of speech Following commands: Follows one step commands inconsistently Cueing  Techniques: Verbal cues;Gestural cues;Tactile cues  Cognition Arousal: Alert Behavior During Therapy: WFL for tasks assessed/performed, Flat affect Overall Cognitive Status: Within Functional Limits for tasks assessed                                 General Comments: Oriented x4 however pt not following simple motor commands consistently        General Comments General comments (skin integrity, edema, etc.): VSS at start of session, HR in 70s throughout. Attempted further vestibular investigation however pt not consistent with following instructions today. Fatigues easily. No resting nystagmus or end range nystagmus noted with what little I was able to get her to do. Difficulty tracking upwards with eyes initially, then no issues.    Exercises     Assessment/Plan    PT Assessment Patient needs continued PT services  PT Problem List Decreased strength;Decreased balance;Decreased mobility;Decreased activity tolerance;Decreased knowledge of use of DME;Decreased safety awareness;Decreased cognition;Pain       PT Treatment Interventions DME instruction;Gait training;Balance training;Functional mobility training;Therapeutic activities;Therapeutic exercise;Patient/family education;Neuromuscular re-education;Modalities    PT Goals (Current goals can be found in the Care Plan section)  Acute Rehab PT Goals Patient Stated Goal: Go home PT Goal Formulation: With patient Time For Goal  Achievement: 02/04/23 Potential to Achieve Goals: Good    Frequency Min 1X/week     Co-evaluation               AM-PAC PT "6 Clicks" Mobility  Outcome Measure Help needed turning from your back to your side while in a flat bed without using bedrails?: A Little Help needed moving from lying on your back to sitting on the side of a flat bed without using bedrails?: A Little Help needed moving to and from a bed to a chair (including a wheelchair)?: A Lot Help needed standing up from a  chair using your arms (e.g., wheelchair or bedside chair)?: A Lot Help needed to walk in hospital room?: Total Help needed climbing 3-5 steps with a railing? : Total 6 Click Score: 12    End of Session Equipment Utilized During Treatment: Gait belt Activity Tolerance: Patient limited by fatigue Patient left: in bed;with call bell/phone within reach;with bed alarm set;with SCD's reapplied   PT Visit Diagnosis: Difficulty in walking, not elsewhere classified (R26.2);Muscle weakness (generalized) (M62.81);Dizziness and giddiness (R42);Unsteadiness on feet (R26.81);Other symptoms and signs involving the nervous system (R29.898)    Time: 1610-9604 PT Time Calculation (min) (ACUTE ONLY): 20 min   Charges:   PT Evaluation $PT Re-evaluation: 1 Re-eval   PT General Charges $$ ACUTE PT VISIT: 1 Visit         Kathlyn Sacramento, PT, DPT [facility]  Rehabilitation Services Physical Therapist Office: [phone] Website: [email or web address]   Berton Mount 01/21/2023, 5:27 PM

## 2023-01-22 DIAGNOSIS — R7401 Elevation of levels of liver transaminase levels: Secondary | ICD-10-CM | POA: Diagnosis not present

## 2023-01-22 DIAGNOSIS — D649 Anemia, unspecified: Secondary | ICD-10-CM | POA: Diagnosis not present

## 2023-01-22 DIAGNOSIS — Z8639 Personal history of other endocrine, nutritional and metabolic disease: Secondary | ICD-10-CM

## 2023-01-22 DIAGNOSIS — R10827 Generalized rebound abdominal tenderness: Secondary | ICD-10-CM | POA: Diagnosis not present

## 2023-01-22 DIAGNOSIS — U071 COVID-19: Secondary | ICD-10-CM | POA: Diagnosis not present

## 2023-01-22 LAB — COMPREHENSIVE METABOLIC PANEL
ALT: 97 U/L — ABNORMAL HIGH (ref 0–44)
AST: 61 U/L — ABNORMAL HIGH (ref 15–41)
Albumin: 2.8 g/dL — ABNORMAL LOW (ref 3.5–5.0)
Alkaline Phosphatase: 119 U/L (ref 38–126)
Anion gap: 8 (ref 5–15)
BUN: 18 mg/dL (ref 8–23)
CO2: 22 mmol/L (ref 22–32)
Calcium: 8.5 mg/dL — ABNORMAL LOW (ref 8.9–10.3)
Chloride: 111 mmol/L (ref 98–111)
Creatinine, Ser: 0.94 mg/dL (ref 0.44–1.00)
GFR, Estimated: >60 mL/min (ref >60)
Glucose, Bld: 115 mg/dL — ABNORMAL HIGH (ref 70–99)
Potassium: 4 mmol/L (ref 3.5–5.1)
Sodium: 141 mmol/L (ref 135–145)
Total Bilirubin: 0.6 mg/dL (ref 0.3–1.2)
Total Protein: 5.9 g/dL — ABNORMAL LOW (ref 6.5–8.1)

## 2023-01-22 LAB — CBC
HCT: 35.2 % — ABNORMAL LOW (ref 36.0–46.0)
Hemoglobin: 11.2 g/dL — ABNORMAL LOW (ref 12.0–15.0)
MCH: 26.5 pg (ref 26.0–34.0)
MCHC: 31.8 g/dL (ref 30.0–36.0)
MCV: 83.2 fL (ref 80.0–100.0)
Platelets: 320 10*3/uL (ref 150–400)
RBC: 4.23 MIL/uL (ref 3.87–5.11)
RDW: 17.4 % — ABNORMAL HIGH (ref 11.5–15.5)
WBC: 5.8 10*3/uL (ref 4.0–10.5)
nRBC: 0 % (ref 0.0–0.2)

## 2023-01-22 LAB — MAGNESIUM: Magnesium: 2 mg/dL (ref 1.7–2.4)

## 2023-01-22 NOTE — Plan of Care (Signed)
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
## 2023-01-23 DIAGNOSIS — R7401 Elevation of levels of liver transaminase levels: Secondary | ICD-10-CM | POA: Diagnosis not present

## 2023-01-23 DIAGNOSIS — U071 COVID-19: Secondary | ICD-10-CM | POA: Diagnosis not present

## 2023-01-23 DIAGNOSIS — D649 Anemia, unspecified: Secondary | ICD-10-CM | POA: Diagnosis not present

## 2023-01-23 DIAGNOSIS — R10827 Generalized rebound abdominal tenderness: Secondary | ICD-10-CM | POA: Diagnosis not present

## 2023-01-23 MED ORDER — HYDROCODONE BIT-HOMATROP MBR 5-1.5 MG/5ML PO SOLN: 5.0000 mL | Freq: Four times a day (QID) | ORAL | Status: DC | PRN

## 2023-01-23 MED ORDER — GABAPENTIN 100 MG PO CAPS
200.0000 mg | ORAL_CAPSULE | Freq: Two times a day (BID) | ORAL | Status: DC
  Administered 2023-01-23 – 2023-01-25 (×5): 200 mg via ORAL
  Filled 2023-01-23 (×5): qty 2

## 2023-01-23 MED ORDER — OXYCODONE HCL 5 MG PO TABS
5.0000 mg | ORAL_TABLET | Freq: Four times a day (QID) | ORAL | Status: DC | PRN
  Administered 2023-01-23 – 2023-01-24 (×5): 5 mg via ORAL
  Filled 2023-01-23 (×5): qty 1

## 2023-01-23 NOTE — Plan of Care (Signed)
  Problem: Clinical Measurements: Goal: Respiratory complications will improve Outcome: Progressing Goal: Cardiovascular complication will be avoided Outcome: Progressing   Problem: Activity: Goal: Risk for activity intolerance will decrease Outcome: Progressing   Problem: Nutrition: Goal: Adequate nutrition will be maintained Outcome: Progressing   Problem: Coping: Goal: Level of anxiety will decrease Outcome: Progressing   
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
## 2023-01-23 NOTE — Plan of Care (Signed)

## 2023-01-24 DIAGNOSIS — D649 Anemia, unspecified: Secondary | ICD-10-CM | POA: Diagnosis not present

## 2023-01-24 DIAGNOSIS — R7401 Elevation of levels of liver transaminase levels: Secondary | ICD-10-CM | POA: Diagnosis not present

## 2023-01-24 DIAGNOSIS — R10827 Generalized rebound abdominal tenderness: Secondary | ICD-10-CM | POA: Diagnosis not present

## 2023-01-24 DIAGNOSIS — U071 COVID-19: Secondary | ICD-10-CM | POA: Diagnosis not present

## 2023-01-24 LAB — GLUCOSE, CAPILLARY: Glucose-Capillary: 149 mg/dL — ABNORMAL HIGH (ref 70–99)

## 2023-01-24 NOTE — Progress Notes (Signed)
Physical Therapy Treatment Patient Details Name: [name] MRN: [id number] DOB: December 31, 1955 Today's Date: 01/24/2023   History of Present Illness Pt is a 67 year old female who presented with vertigo started 3 days prior to admission with nausea and vomiting and admitted 01/15/23 for Diarrhea/abdominal tenderness/elevated LFTs.  Also found to be Covid positive.  10/3 code stroke called, transferred to [facility], possible TIA. PMH anemia, anxiety, OA, asthma, CHF, chronic back pain, DM, hx DVT s/p IVC filter, dysrhythmia, fibro, peripheral neuropathy, hernia repair, R knee ligament repair, lumbar fusion, exocrine pancreatic insufficiency, frequent UTI, C. difficile, essential hypertension, chronic diastolic heart failure, gastric bypass    PT Comments  Able to mobilize out of bed this session. Moving with greater ease. CGA for sit to stand and step pivot transfer using RW from bed to recliner. Reviewed LE exercises and encouraged routine use during admission to mitigate effects of immobility. Valsalva maneuver negative for nystgamus and no increased dizziness reported, only sinus pressure, (there was concern for endolymphatic fistula,) although this cannot r/o EH, her reported symptoms seem less consistent with this. Complains of dizziness associated with pain radiating from Rt ear, neck, and entire RUE, worse with cervical ROM. Could be radiculpathic with cervicogenic dizziness. Would follow-up vestibular rehab as outpatient. Still appropriate for SNF until she can mobilize independently again. Patient will continue to benefit from skilled physical therapy services to further improve independence with functional mobility.    If plan is discharge home, recommend the following: Assistance with cooking/housework;Help with stairs or ramp for entrance;A little help with walking and/or transfers;A little help with bathing/dressing/bathroom   Can travel by private vehicle     Yes  Equipment  Recommendations  None recommended by PT (defer to next venue)    Recommendations for Other Services       Precautions / Restrictions Precautions Precautions: Fall Restrictions Weight Bearing Restrictions: No     Mobility  Bed Mobility Overal bed mobility: Needs Assistance Bed Mobility: Supine to Sit     Supine to sit: Contact guard, Used rails, HOB elevated     General bed mobility comments: CGA for safety, extra time, no assist.    Transfers Overall transfer level: Needs assistance Equipment used: Rolling walker (2 wheels) Transfers: Sit to/from Stand, Bed to chair/wheelchair/BSC Sit to Stand: Contact guard assist   Step pivot transfers: Contact guard assist       General transfer comment: CGA for sit to stand transition from EOB with cues for hand placement. Able to step pivot slowly to recliner with RW for support. Complains of cervical, RUE, and Rt ear pain with neck movement.    Ambulation/Gait               General Gait Details: deferred   Stairs             Wheelchair Mobility     Tilt Bed    Modified Rankin (Stroke Patients Only)       Balance Overall balance assessment: Needs assistance Sitting-balance support: No upper extremity supported, Feet supported Sitting balance-Leahy Scale: Fair Sitting balance - Comments: sat EOB at supervision level today   Standing balance support: Reliant on assistive device for balance, Bilateral upper extremity supported, During functional activity Standing balance-Leahy Scale: Poor Standing balance comment: Reliant on RW                            Cognition Arousal: Alert Behavior During Therapy: [facility] for  tasks assessed/performed, Flat affect Overall Cognitive Status: Within Functional Limits for tasks assessed                                          Exercises General Exercises - Lower Extremity Ankle Circles/Pumps: AROM, Both, 10 reps, Seated Quad Sets:  Strengthening, Both, 10 reps, Seated Gluteal Sets: Strengthening, Both, 10 reps, Seated    General Comments General comments (skin integrity, edema, etc.): SpO2 100% on RA.      Pertinent Vitals/Pain Pain Assessment Pain Assessment: Faces Faces Pain Scale: Hurts even more Pain Location: head and neck Pain Descriptors / Indicators: Headache, Grimacing Pain Intervention(s): Monitored during session    Home Living                          Prior Function            PT Goals (current goals can now be found in the care plan section) Acute Rehab PT Goals Patient Stated Goal: Go home PT Goal Formulation: With patient Time For Goal Achievement: 02/04/23 Potential to Achieve Goals: Good Progress towards PT goals: Progressing toward goals    Frequency    Min 1X/week      PT Plan      Co-evaluation              AM-PAC PT "6 Clicks" Mobility   Outcome Measure  Help needed turning from your back to your side while in a flat bed without using bedrails?: A Little Help needed moving from lying on your back to sitting on the side of a flat bed without using bedrails?: A Little Help needed moving to and from a bed to a chair (including a wheelchair)?: A Little Help needed standing up from a chair using your arms (e.g., wheelchair or bedside chair)?: A Little Help needed to walk in hospital room?: Total Help needed climbing 3-5 steps with a railing? : Total 6 Click Score: 14    End of Session Equipment Utilized During Treatment: Gait belt Activity Tolerance: Patient tolerated treatment well Patient left: with call bell/phone within reach;with SCD's reapplied;in chair;with chair alarm set Nurse Communication: Mobility status PT Visit Diagnosis: Difficulty in walking, not elsewhere classified (R26.2);Muscle weakness (generalized) (M62.81);Dizziness and giddiness (R42);Unsteadiness on feet (R26.81);Other symptoms and signs involving the nervous system (R29.898)      Time: 1829-9371 PT Time Calculation (min) (ACUTE ONLY): 23 min  Charges:    $Therapeutic Exercise: 8-22 mins $Therapeutic Activity: 8-22 mins PT General Charges $$ ACUTE PT VISIT: 1 Visit                     Kathlyn Sacramento, PT, DPT [facility]  Rehabilitation Services Physical Therapist Office: [phone] Website: [email or web address]    Berton Mount 01/24/2023, 1:52 PM

## 2023-01-24 NOTE — Progress Notes (Signed)
Consult received after patient pulled out midline. At this time the patient does not have infusions, tests, or other orders to substantiate PIV placement at this time. Secure chat sent to patient's instructing to consult VAST if patient status changes or MD places orders to substantiate PIV placement. Tomasita Morrow, RN VAST

## 2023-01-24 NOTE — Plan of Care (Signed)
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
## 2023-01-24 NOTE — Consult Note (Signed)
[facility] Customer service manager [facility]) Accountable Care Organization (ACO) [facility] Liaison Note  01/24/2023  [name] 1956/04/16 [id number]  Location: [facility] RN Hospital Liaison screened the patient remotely at [facility].  Insurance: [employer] HMO   [name] is a 67 y.o. female who is a Primary Care Patient of Myrlene Broker, MD [facility] [place]). The patient was screened for  readmission hospitalization with noted medium risk score for unplanned readmission risk with 1 IP in 6 months.  The patient was assessed for potential [facility]) Care Management service needs for post hospital transition for care coordination. Review of patient's electronic medical record reveals patient was admitted for Dizziness. Pt pending consult for CIR level of care for ongoing rehabilitation.   Plan: [facility] Liaison will continue to follow progress and disposition to asess for post hospital community care coordination/management needs.  Referral request for community care coordination: pending disposition.   [facility] Care Management/Population Health does not replace or interfere with any arrangements made by the Inpatient Transition of Care team.   For questions contact:   Elliot Cousin, RN, [facility] Liaison [place]   Population Health Office Hours MTWF  8:00 am-6:00 pm [phone] mobile [phone] [Office toll free line] Office Hours are M-F 8:30 - 5 pm [email or web address]

## 2023-01-25 DIAGNOSIS — R10827 Generalized rebound abdominal tenderness: Secondary | ICD-10-CM | POA: Diagnosis not present

## 2023-01-25 DIAGNOSIS — R7401 Elevation of levels of liver transaminase levels: Secondary | ICD-10-CM | POA: Diagnosis not present

## 2023-01-25 DIAGNOSIS — I10 Essential (primary) hypertension: Secondary | ICD-10-CM | POA: Diagnosis not present

## 2023-01-25 DIAGNOSIS — J189 Pneumonia, unspecified organism: Secondary | ICD-10-CM | POA: Diagnosis not present

## 2023-01-25 MED ORDER — GUAIFENESIN ER 600 MG PO TB12: 600.0000 mg | ORAL_TABLET | Freq: Two times a day (BID) | ORAL | 0 refills | Status: DC

## 2023-01-25 MED ORDER — ASPIRIN 81 MG PO TBEC: 81.0000 mg | DELAYED_RELEASE_TABLET | Freq: Every day | ORAL | 12 refills | Status: DC

## 2023-01-25 MED ORDER — OXYCODONE HCL 5 MG PO TABS: 5.0000 mg | ORAL_TABLET | Freq: Four times a day (QID) | ORAL | 0 refills | Status: DC | PRN

## 2023-01-25 NOTE — Plan of Care (Signed)
  Problem: Education: Goal: Knowledge of General Education information will improve Description: Including pain rating scale, medication(s)/side effects and non-pharmacologic comfort measures Outcome: Adequate for Discharge   Problem: Health Behavior/Discharge Planning: Goal: Ability to manage health-related needs will improve Outcome: Adequate for Discharge   Problem: Clinical Measurements: Goal: Ability to maintain clinical measurements within normal limits will improve Outcome: Adequate for Discharge Goal: Will remain free from infection Outcome: Adequate for Discharge Goal: Diagnostic test results will improve Outcome: Adequate for Discharge Goal: Respiratory complications will improve Outcome: Adequate for Discharge Goal: Cardiovascular complication will be avoided Outcome: Adequate for Discharge   Problem: Activity: Goal: Risk for activity intolerance will decrease Outcome: Adequate for Discharge   Problem: Nutrition: Goal: Adequate nutrition will be maintained Outcome: Adequate for Discharge   Problem: Coping: Goal: Level of anxiety will decrease Outcome: Adequate for Discharge   Problem: Elimination: Goal: Will not experience complications related to bowel motility Outcome: Adequate for Discharge Goal: Will not experience complications related to urinary retention Outcome: Adequate for Discharge   Problem: Pain Managment: Goal: General experience of comfort will improve Outcome: Adequate for Discharge   Problem: Safety: Goal: Ability to remain free from injury will improve Outcome: Adequate for Discharge   Problem: Skin Integrity: Goal: Risk for impaired skin integrity will decrease Outcome: Adequate for Discharge   Problem: Acute Rehab PT Goals(only PT should resolve) Goal: Pt Will Go Supine/Side To Sit Outcome: Adequate for Discharge Goal: Patient Will Transfer Sit To/From Stand Outcome: Adequate for Discharge Goal: Pt Will Ambulate Outcome: Adequate  for Discharge   Problem: Acute Rehab OT Goals (only OT should resolve) Goal: Pt. Will Perform Upper Body Dressing Outcome: Adequate for Discharge Goal: Pt. Will Transfer To Toilet Outcome: Adequate for Discharge Goal: Pt. Will Perform Toileting-Clothing Manipulation Outcome: Adequate for Discharge   Problem: Education: Goal: Knowledge of disease or condition will improve Outcome: Adequate for Discharge Goal: Knowledge of secondary prevention will improve (MUST DOCUMENT ALL) Outcome: Adequate for Discharge Goal: Knowledge of patient specific risk factors will improve Loraine Leriche N/A or DELETE if not current risk factor) Outcome: Adequate for Discharge   Problem: Ischemic Stroke/TIA Tissue Perfusion: Goal: Complications of ischemic stroke/TIA will be minimized Outcome: Adequate for Discharge   Problem: Coping: Goal: Will verbalize positive feelings about self Outcome: Adequate for Discharge Goal: Will identify appropriate support needs Outcome: Adequate for Discharge   Problem: Health Behavior/Discharge Planning: Goal: Ability to manage health-related needs will improve Outcome: Adequate for Discharge Goal: Goals will be collaboratively established with patient/family Outcome: Adequate for Discharge   Problem: Self-Care: Goal: Ability to participate in self-care as condition permits will improve Outcome: Adequate for Discharge Goal: Verbalization of feelings and concerns over difficulty with self-care will improve Outcome: Adequate for Discharge Goal: Ability to communicate needs accurately will improve Outcome: Adequate for Discharge   Problem: Nutrition: Goal: Risk of aspiration will decrease Outcome: Adequate for Discharge Goal: Dietary intake will improve Outcome: Adequate for Discharge

## 2023-01-25 NOTE — TOC Progression Note (Signed)
Transition of Care [facility]) - Progression Note    Patient Details  Name: [name] MRN: [id number] Date of Birth: 20-Aug-1955  Transition of Care [facility]) CM/SW Contact  Baldemar Lenis, [place] Phone Number: 01/25/2023, 11:45 AM  Clinical Narrative:   CSW spoke with patient this morning about SNF recommendation. Patient says she has been to SNF before and it was horrible, she wants to go home and have therapy at home. CSW explained to patient having help at home, and patient is sure that her sister will stay with her to help when she goes home. Patient to reach out to sister and CSW to check back with patient later about support. Patient already has RW and rollator at home. Patient asking for [facility] for home health. CSW contacted [facility], referral was already given when patient was at [facility]. CSW to follow.    Expected Discharge Plan: Home w Home Health Services Barriers to Discharge: Continued Medical Work up  Expected Discharge Plan and Services In-house Referral: Clinical Social Work Discharge Planning Services: NA Post Acute Care Choice: Skilled Nursing Facility Living arrangements for the past 2 months: Single Family Home Expected Discharge Date: 01/20/23               DME Arranged: N/A DME Agency: NA       HH Arranged: PT HH Agency: [facility] Date [facility] Agency Contacted: 01/18/23 Time HH Agency Contacted: 1119 Representative spoke with at [facility] Agency: Cindie   Social Determinants of Health (SDOH) Interventions SDOH Screenings   Food Insecurity: No Food Insecurity (01/15/2023)  Recent Concern: Food Insecurity - Food Insecurity Present (01/04/2023)  Housing: Low Risk  (01/15/2023)  Transportation Needs: No Transportation Needs (01/15/2023)  Recent Concern: Transportation Needs - Unmet Transportation Needs (12/31/2022)  Utilities: Not At Risk (01/15/2023)  Recent Concern: Utilities - At Risk (01/04/2023)  Alcohol Screen: Low Risk  (10/05/2022)  Depression  (PHQ2-9): Low Risk  (10/05/2022)  Financial Resource Strain: Medium Risk (10/05/2022)  Physical Activity: Inactive (10/05/2022)  Social Connections: Moderately Isolated (10/05/2022)  Stress: No Stress Concern Present (10/05/2022)  Tobacco Use: Medium Risk (01/15/2023)    Readmission Risk Interventions    01/17/2023    2:53 PM  Readmission Risk Prevention Plan  Post Dischage Appt Complete  Medication Screening Complete  Transportation Screening Complete
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
## 2023-01-25 NOTE — Plan of Care (Signed)

## 2023-01-25 NOTE — Progress Notes (Signed)
    Durable Medical Equipment  (From admission, onward)           Start     Ordered   01/25/23 1449  For home use only DME Bedside commode  Once       Question:  Patient needs a bedside commode to treat with the following condition  Answer:  Weakness   01/25/23 1448             The patient is confined to one level of the home environment and there is no toilet on the that level of the home.

## 2023-01-25 NOTE — TOC Transition Note (Signed)
Transition of Care [facility]) - CM/SW Discharge Note   Patient Details  Name: [name] MRN: [id number] Date of Birth: 02-May-1955  Transition of Care [facility]) CM/SW Contact:  Baldemar Lenis, LCSW Phone Number: 01/25/2023, 3:13 PM   Clinical Narrative:   CSW confirmed with OT that patient is safe for home with home health, sister will come stay with the patient to ensure her safety. Recommending a 3N1, patient in agreement. Order sent to [employer], they will bring to the room. Sister to provide transportation home. [facility] following for home health. No further TOC needs at this time.    Final next level of care: Home w Home Health Services Barriers to Discharge: Barriers Resolved   Patient Goals and CMS Choice CMS Medicare.gov Compare Post Acute Care list provided to:: Patient Choice offered to / list presented to : Patient  Discharge Placement                  Patient to be transferred to facility by: Sister Name of family member notified: Self Patient and family notified of of transfer: 01/25/23  Discharge Plan and Services Additional resources added to the After Visit Summary for   In-house Referral: Clinical Social Work Discharge Planning Services: NA Post Acute Care Choice: Skilled Nursing Facility          DME Arranged: 3-N-1 DME Agency: [employer] Date DME Agency Contacted: 01/25/23   Representative spoke with at DME Agency: Zack HH Arranged: RN, PT, OT, Nurse's Aide HH Agency: [facility] Date [facility] Agency Contacted: 01/25/23 Time HH Agency Contacted: 1119 Representative spoke with at [facility] Agency: Kandee Keen  Social Determinants of Health (SDOH) Interventions SDOH Screenings   Food Insecurity: No Food Insecurity (01/15/2023)  Recent Concern: Food Insecurity - Food Insecurity Present (01/04/2023)  Housing: Low Risk  (01/15/2023)  Transportation Needs: No Transportation Needs (01/15/2023)  Recent Concern: Transportation Needs - Unmet Transportation Needs  (12/31/2022)  Utilities: Not At Risk (01/15/2023)  Recent Concern: Utilities - At Risk (01/04/2023)  Alcohol Screen: Low Risk  (10/05/2022)  Depression (PHQ2-9): Low Risk  (10/05/2022)  Financial Resource Strain: Medium Risk (10/05/2022)  Physical Activity: Inactive (10/05/2022)  Social Connections: Moderately Isolated (10/05/2022)  Stress: No Stress Concern Present (10/05/2022)  Tobacco Use: Medium Risk (01/15/2023)     Readmission Risk Interventions    01/17/2023    2:53 PM  Readmission Risk Prevention Plan  Post Dischage Appt Complete  Medication Screening Complete  Transportation Screening Complete

## 2023-01-25 NOTE — TOC Progression Note (Signed)
Transition of Care [facility]) - Progression Note    Patient Details  Name: [name] MRN: [id number] Date of Birth: May 17, 1955  Transition of Care [facility]) CM/SW Contact  Baldemar Lenis, [place] Phone Number: 01/25/2023, 11:45 AM  Clinical Narrative:   CSW attempted to reach patient throughout the day but no answer, to discuss SNF placement. CSW to follow.    Expected Discharge Plan: Skilled Nursing Facility Barriers to Discharge: Continued Medical Work up  Expected Discharge Plan and Services In-house Referral: Clinical Social Work Discharge Planning Services: NA Post Acute Care Choice: Skilled Nursing Facility Living arrangements for the past 2 months: Single Family Home Expected Discharge Date: 01/20/23               DME Arranged: N/A DME Agency: NA       HH Arranged: PT HH Agency: [facility] Date [facility] Agency Contacted: 01/18/23 Time HH Agency Contacted: 1119 Representative spoke with at [facility] Agency: Cindie   Social Determinants of Health (SDOH) Interventions SDOH Screenings   Food Insecurity: No Food Insecurity (01/15/2023)  Recent Concern: Food Insecurity - Food Insecurity Present (01/04/2023)  Housing: Low Risk  (01/15/2023)  Transportation Needs: No Transportation Needs (01/15/2023)  Recent Concern: Transportation Needs - Unmet Transportation Needs (12/31/2022)  Utilities: Not At Risk (01/15/2023)  Recent Concern: Utilities - At Risk (01/04/2023)  Alcohol Screen: Low Risk  (10/05/2022)  Depression (PHQ2-9): Low Risk  (10/05/2022)  Financial Resource Strain: Medium Risk (10/05/2022)  Physical Activity: Inactive (10/05/2022)  Social Connections: Moderately Isolated (10/05/2022)  Stress: No Stress Concern Present (10/05/2022)  Tobacco Use: Medium Risk (01/15/2023)    Readmission Risk Interventions    01/17/2023    2:53 PM  Readmission Risk Prevention Plan  Post Dischage Appt Complete  Medication Screening Complete  Transportation Screening Complete

## 2023-01-25 NOTE — Progress Notes (Signed)
Per discussion with Dr. Tyson Babinski - pt is to resume amlodipine at discharge - and not to wait until 02/01/23 as stated on AVS. Pt informed, acknowledged and understood this change. AVS reviewed in full. All questions answered.

## 2023-01-25 NOTE — Progress Notes (Addendum)
Occupational Therapy Treatment Patient Details Name: [name] MRN: [id number] DOB: [birth year] Today's Date: 01/25/2023   History of present illness Pt is a 67 year old female who presented with vertigo started 3 days prior to admission with nausea and vomiting and admitted 01/15/23 for Diarrhea/abdominal tenderness/elevated LFTs.  Also found to be Covid positive.  10/3 code stroke called, transferred to [facility], possible TIA. PMH anemia, anxiety, OA, asthma, CHF, chronic back pain, DM, hx DVT s/p IVC filter, dysrhythmia, fibro, peripheral neuropathy, hernia repair, R knee ligament repair, lumbar fusion, exocrine pancreatic insufficiency, frequent UTI, C. difficile, essential hypertension, chronic diastolic heart failure, gastric bypass   OT comments  Pt progressing well towards goals this session, needing set up -min A for ADLs, and CGA for transfers and household distance ambulation in room with RW. Pt able to complete standing/seated bathing and dressing tasks at sink, mild dizziness during session but BP WNL. Pt states her sister will be at home to assist her. Pt presenting with impairments listed below, will follow acutely. Recommend HHOT at d/c.       If plan is discharge home, recommend the following:  A little help with walking and/or transfers;A little help with bathing/dressing/bathroom;Assistance with cooking/housework;Direct supervision/assist for financial management;Direct supervision/assist for medications management;Help with stairs or ramp for entrance;Assist for transportation   Equipment Recommendations  BSC/3in1 (as shower seat)    Recommendations for Other Services PT consult    Precautions / Restrictions Precautions Precautions: Fall Restrictions Weight Bearing Restrictions: No       Mobility Bed Mobility               General bed mobility comments: on BSC Upon arrival    Transfers Overall transfer level: Needs assistance Equipment used: Rolling  walker (2 wheels) Transfers: Sit to/from Stand, Bed to chair/wheelchair/BSC Sit to Stand: Contact guard assist     Step pivot transfers: Contact guard assist           Balance Overall balance assessment: Needs assistance Sitting-balance support: No upper extremity supported, Feet supported Sitting balance-Leahy Scale: Fair     Standing balance support: Reliant on assistive device for balance, Bilateral upper extremity supported, During functional activity Standing balance-Leahy Scale: Fair Standing balance comment: unilateral UE support at sink                           ADL either performed or assessed with clinical judgement   ADL Overall ADL's : Needs assistance/impaired     Grooming: Oral care;Wash/dry face;Set up;Standing   Upper Body Bathing: Set up;Sitting   Lower Body Bathing: Set up;Sitting/lateral leans   Upper Body Dressing : Minimal assistance;Sitting Upper Body Dressing Details (indicate cue type and reason): donning clean gown     Toilet Transfer: Contact guard assist;Ambulation;Rolling walker (2 wheels);Regular Toilet   Toileting- Clothing Manipulation and Hygiene: Supervision/safety       Functional mobility during ADLs: Contact guard assist;Rolling walker (2 wheels)      Extremity/Trunk Assessment Upper Extremity Assessment Upper Extremity Assessment: Generalized weakness   Lower Extremity Assessment Lower Extremity Assessment: Defer to PT evaluation        Vision   Vision Assessment?: No apparent visual deficits Additional Comments: WFL for BADL   Perception Perception Perception: Not tested   Praxis Praxis Praxis: Not tested    Cognition Arousal: Alert Behavior During Therapy: WFL for tasks assessed/performed, Flat affect Overall Cognitive Status: Within Functional Limits for tasks assessed  Exercises      Shoulder Instructions       General Comments VSS  on RA    Pertinent Vitals/ Pain       Pain Assessment Pain Assessment: No/denies pain  Home Living                                          Prior Functioning/Environment              Frequency  Min 1X/week        Progress Toward Goals  OT Goals(current goals can now be found in the care plan section)  Progress towards OT goals: Progressing toward goals  Acute Rehab OT Goals Patient Stated Goal: none stated OT Goal Formulation: With patient Time For Goal Achievement: 01/30/23 Potential to Achieve Goals: Fair ADL Goals Pt Will Perform Upper Body Dressing: with supervision;sitting Pt Will Transfer to Toilet: with supervision;ambulating;regular height toilet Pt Will Perform Toileting - Clothing Manipulation and hygiene: with supervision  Plan      Co-evaluation                 AM-PAC OT "6 Clicks" Daily Activity     Outcome Measure   Help from another person eating meals?: A Little Help from another person taking care of personal grooming?: A Little Help from another person toileting, which includes using toliet, bedpan, or urinal?: A Little Help from another person bathing (including washing, rinsing, drying)?: A Little Help from another person to put on and taking off regular upper body clothing?: A Little Help from another person to put on and taking off regular lower body clothing?: A Little 6 Click Score: 18    End of Session Equipment Utilized During Treatment: Gait belt;Rolling walker (2 wheels)  OT Visit Diagnosis: Unsteadiness on feet (R26.81);Other abnormalities of gait and mobility (R26.89);History of falling (Z91.81)   Activity Tolerance Patient tolerated treatment well   Patient Left in chair;with call bell/phone within reach;with chair alarm set   Nurse Communication Mobility status        Time: 6440-3474 OT Time Calculation (min): 28 min  Charges: OT General Charges $OT Visit: 1 Visit OT Treatments $Self  Care/Home Management : 23-37 mins  Carver Fila, OTD, OTR/L SecureChat Preferred Acute Rehab [phone]   Carver Fila Koonce 01/25/2023, 3:27 PM

## 2023-01-26 ENCOUNTER — Ambulatory Visit: Payer: Medicare HMO | Admitting: Licensed Clinical Social Worker

## 2023-01-26 ENCOUNTER — Ambulatory Visit: Payer: Self-pay

## 2023-01-26 ENCOUNTER — Telehealth: Payer: Self-pay

## 2023-01-26 NOTE — Patient Outreach (Signed)
Care Coordination   Follow Up Visit Note   01/26/2023 Name: [name] MRN: [id number] DOB: 1955-07-28  [name] is a 67 y.o. year old female who sees Myrlene Broker, MD for primary care. I spoke with  [name] by phone today.  What matters to the patients health and wellness today?  SW contacted the patient for a scheduled follow up regarding resource needs. Discussed the patient just returned home from the hospital yesterday. She has picked up her prescriptions and has family assisting as needed. She has not yet been contacted by [facility] regarding home health services.  SW encouraged the patient to contact care coordination team if she does not hear from [facility] in the next two days.  SDOH assessments and interventions completed:  No     Care Coordination Interventions:  Yes, provided   Interventions Today    Flowsheet Row Most Recent Value  General Interventions   General Interventions Discussed/Reviewed General Interventions Reviewed, Community Resources  [Contacted the patient to review resource needs. Discussed patient just returned home from the hospital. She reports her familyis assisting with care needs. Scheduled SW follow up call over the next two weeks]        Follow up plan:  SW will continue to follow.    Encounter Outcome:  Patient Visit Completed   Bevelyn Ngo, BSW, CDP [facility]  [facility], [facility] Social Worker Direct Dial: [phone]  Fax: [phone]

## 2023-01-26 NOTE — Transitions of Care (Post Inpatient/ED Visit) (Signed)
01/26/2023  Name: [name] MRN: [id number] DOB: June 01, 1955  Today's TOC FU Call Status: Today's TOC FU Call Status:: Successful TOC FU Call Completed TOC FU Call Complete Date: 01/26/23 Patient's Name and Date of Birth confirmed.  Transition Care Management Follow-up Telephone Call Date of Discharge: 01/25/23 Discharge Facility: [facility]) Type of Discharge: Inpatient Admission Primary Inpatient Discharge Diagnosis:: dizziness How have you been since you were released from the hospital?: Better Any questions or concerns?: No  Items Reviewed: Did you receive and understand the discharge instructions provided?: Yes Medications obtained,verified, and reconciled?: Yes (Medications Reviewed) Any new allergies since your discharge?: No Dietary orders reviewed?: Yes Do you have support at home?: No  Medications Reviewed Today: Medications Reviewed Today     Reviewed by Karena Addison, LPN (Licensed Practical Nurse) on 01/26/23 at 1100  Med List Status: <None>   Medication Order Taking? Sig Documenting Provider Last Dose Status Informant  albuterol (PROVENTIL) (2.5 MG/3ML) 0.083% nebulizer solution [id number] No Take 3 mLs (2.5 mg total) by nebulization every 6 (six) hours as needed for wheezing or shortness of breath. Margaretann Loveless, PA-C Past Week Active Self, Pharmacy Records  albuterol (VENTOLIN HFA) 108 (90 Base) MCG/ACT inhaler [id number] No INHALE 2 PUFFS INTO THE LUNGS EVERY 6 HOURS AS NEEDED FOR WHEEZING OR SHORTNESS OF BREATH Myrlene Broker, MD Unk Active Self, Pharmacy Records  amLODipine (NORVASC) 10 MG tablet [id number]  Take 1 tablet (10 mg total) by mouth daily. Marinda Elk, MD  Active   Ascorbic Acid (VITAMIN C) 1000 MG tablet [id number] No Take 1,000 mg by mouth daily. [provider] Past Week Active Self, Pharmacy Records  aspirin EC 81 MG tablet [id number]  Take 1 tablet (81 mg total) by mouth daily. Swallow whole. Pokhrel,  Laxman, MD  Active   benzonatate (TESSALON) 100 MG capsule [id number] No Take 1 capsule (100 mg total) by mouth 3 (three) times daily as needed for cough. Waldon Merl, PA-C Past Week Active Self, Pharmacy Records  buPROPion (WELLBUTRIN XL) 300 MG 24 hr tablet [id number] No Take 1 tablet (300 mg total) by mouth daily. Myrlene Broker, MD Past Week Active Self, Pharmacy Records  dextromethorphan-guaiFENesin [facility] DM) 30-600 MG 12hr tablet [id number] No Take 1 tablet by mouth 2 (two) times daily as needed for cough. [provider] Past Week Active Self, Pharmacy Records  ferrous sulfate 325 (65 FE) MG tablet [id number]  Take 1 tablet (325 mg total) by mouth daily with breakfast. Marinda Elk, MD  Active   gabapentin (NEURONTIN) 100 MG capsule [id number] No Take 2 capsules (200 mg total) by mouth 2 (two) times daily. Meuth, Lina Sar, PA-C Past Week Active Self, Pharmacy Records  guaiFENesin (MUCINEX) 600 MG 12 hr tablet [id number]  Take 1 tablet (600 mg total) by mouth 2 (two) times daily. Pokhrel, Laxman, MD  Active   ipratropium (ATROVENT) 0.03 % nasal spray [id number] No Place 2 sprays into both nostrils every 12 (twelve) hours. X 5-7 days  Patient taking differently: Place 2 sprays into both nostrils as needed for rhinitis.   Margaretann Loveless, PA-C Past Week Active Self, Pharmacy Records  lipase/protease/amylase (CREON) 36000 UNITS CPEP capsule [id number] No Take 36,000 Units by mouth 4 (four) times daily as needed (Stomach Pain). [provider] Past Week Active Self, Pharmacy Records  loperamide (IMODIUM) 2 MG capsule [id number] No Take 1 capsule (2 mg total) by mouth as needed for diarrhea or loose stools. Meuth, Lina Sar,  PA-C 01/15/2023 Active Self, Pharmacy Records  MAGNESIUM PO [id number] No Take 1 tablet by mouth daily. [provider] Past Week Active Self, Pharmacy Records  Menthol, Topical Analgesic, (BIOFREEZE EX) [id number] No Apply 1 Application  topically as needed (Pain). [provider] 01/15/2023 Active Self, Pharmacy Records  montelukast (SINGULAIR) 10 MG tablet [id number] No Take 1 tablet (10 mg total) by mouth at bedtime. Myrlene Broker, MD Past Week Active Self, Pharmacy Records  Multiple Vitamin (MULTI-VITAMIN) tablet [id number] No Take 1 tablet by mouth daily. [provider] Past Week Active Self, Pharmacy Records  ondansetron (ZOFRAN) 4 MG tablet [id number] No Take 1 tablet (4 mg total) by mouth every 8 (eight) hours as needed for nausea or vomiting. Myrlene Broker, MD 01/15/2023 Active Self, Pharmacy Records  oxyCODONE (OXY IR/ROXICODONE) 5 MG immediate release tablet [id number]  Take 1 tablet (5 mg total) by mouth every 6 (six) hours as needed for severe pain or moderate pain. Pokhrel, Laxman, MD  Active   PARoxetine (PAXIL) 20 MG tablet [id number] No Take 1 tablet (20 mg total) by mouth daily. Myrlene Broker, MD Past Week Active Self, Pharmacy Records  psyllium (HYDROCIL/METAMUCIL) 95 % PACK [id number] No Take 1 packet by mouth 2 (two) times daily.  Patient taking differently: Take 1 packet by mouth 2 (two) times daily as needed for mild constipation.   Franne Forts, PA-C Past Month Active Self, Pharmacy Records  traMADol (ULTRAM) 50 MG tablet [id number] No Take 2 tablets (100 mg total) by mouth 2 (two) times daily.  Patient taking differently: Take 50 mg by mouth 2 (two) times daily.   Myrlene Broker, MD Past Week Active Self, Pharmacy Records  traZODone (DESYREL) 50 MG tablet [id number] No Take 1 tablet (50 mg total) by mouth at bedtime as needed for sleep. Myrlene Broker, MD Past Week Active Self, Pharmacy Records  VITAMIN D PO [id number] No Take 1 capsule by mouth daily. [provider] Past Week Active Self, Pharmacy Records            Home Care and Equipment/Supplies: Were Home Health Services Ordered?: Yes Name of Home Health Agency:: [facility] Has Agency set up  a time to come to your home?: No Any new equipment or medical supplies ordered?: Yes Name of Medical supply agency?: unknown Were you able to get the equipment/medical supplies?: No Do you have any questions related to the use of the equipment/supplies?: Yes  Functional Questionnaire: Do you need assistance with bathing/showering or dressing?: Yes Do you need assistance with meal preparation?: Yes Do you need assistance with eating?: No Do you have difficulty maintaining continence: No Do you need assistance with getting out of bed/getting out of a chair/moving?: No Do you have difficulty managing or taking your medications?: No  Follow up appointments reviewed: PCP Follow-up appointment confirmed?: Yes Date of PCP follow-up appointment?: 02/04/23 Follow-up Provider: [facility] Follow-up appointment confirmed?: NA Do you need transportation to your follow-up appointment?: No Do you understand care options if your condition(s) worsen?: Yes-patient verbalized understanding    SIGNATURE Karena Addison, LPN [facility] Nurse Health Advisor Direct Dial [phone]

## 2023-01-27 ENCOUNTER — Telehealth: Payer: Self-pay | Admitting: Internal Medicine

## 2023-01-27 DIAGNOSIS — I110 Hypertensive heart disease with heart failure: Secondary | ICD-10-CM | POA: Diagnosis not present

## 2023-01-27 DIAGNOSIS — D649 Anemia, unspecified: Secondary | ICD-10-CM | POA: Diagnosis not present

## 2023-01-27 DIAGNOSIS — J1282 Pneumonia due to coronavirus disease 2019: Secondary | ICD-10-CM | POA: Diagnosis not present

## 2023-01-27 DIAGNOSIS — K5730 Diverticulosis of large intestine without perforation or abscess without bleeding: Secondary | ICD-10-CM | POA: Diagnosis not present

## 2023-01-27 DIAGNOSIS — I5032 Chronic diastolic (congestive) heart failure: Secondary | ICD-10-CM | POA: Diagnosis not present

## 2023-01-27 DIAGNOSIS — G4733 Obstructive sleep apnea (adult) (pediatric): Secondary | ICD-10-CM | POA: Diagnosis not present

## 2023-01-27 DIAGNOSIS — I951 Orthostatic hypotension: Secondary | ICD-10-CM | POA: Diagnosis not present

## 2023-01-27 DIAGNOSIS — E1142 Type 2 diabetes mellitus with diabetic polyneuropathy: Secondary | ICD-10-CM | POA: Diagnosis not present

## 2023-01-27 DIAGNOSIS — U071 COVID-19: Secondary | ICD-10-CM | POA: Diagnosis not present

## 2023-01-27 NOTE — Telephone Encounter (Signed)
Please advise,

## 2023-01-27 NOTE — Telephone Encounter (Signed)
Caller [employer]:  April from [place]   Phone Number:   [phone]   Needs Verbal orders for what service & frequency:skilled nursing weekly for 5 weeks due to recent hospitalization.  Add home health aide weekly for 3 weeks

## 2023-01-28 DIAGNOSIS — G4733 Obstructive sleep apnea (adult) (pediatric): Secondary | ICD-10-CM | POA: Diagnosis not present

## 2023-01-28 DIAGNOSIS — E1142 Type 2 diabetes mellitus with diabetic polyneuropathy: Secondary | ICD-10-CM | POA: Diagnosis not present

## 2023-01-28 DIAGNOSIS — I951 Orthostatic hypotension: Secondary | ICD-10-CM | POA: Diagnosis not present

## 2023-01-28 DIAGNOSIS — D649 Anemia, unspecified: Secondary | ICD-10-CM | POA: Diagnosis not present

## 2023-01-28 DIAGNOSIS — I5032 Chronic diastolic (congestive) heart failure: Secondary | ICD-10-CM | POA: Diagnosis not present

## 2023-01-28 DIAGNOSIS — I110 Hypertensive heart disease with heart failure: Secondary | ICD-10-CM | POA: Diagnosis not present

## 2023-01-28 DIAGNOSIS — J1282 Pneumonia due to coronavirus disease 2019: Secondary | ICD-10-CM | POA: Diagnosis not present

## 2023-01-28 DIAGNOSIS — K5730 Diverticulosis of large intestine without perforation or abscess without bleeding: Secondary | ICD-10-CM | POA: Diagnosis not present

## 2023-01-28 DIAGNOSIS — U071 COVID-19: Secondary | ICD-10-CM | POA: Diagnosis not present

## 2023-01-31 ENCOUNTER — Ambulatory Visit: Payer: Self-pay

## 2023-01-31 ENCOUNTER — Telehealth: Payer: Self-pay

## 2023-01-31 DIAGNOSIS — G4733 Obstructive sleep apnea (adult) (pediatric): Secondary | ICD-10-CM | POA: Diagnosis not present

## 2023-01-31 DIAGNOSIS — J1282 Pneumonia due to coronavirus disease 2019: Secondary | ICD-10-CM | POA: Diagnosis not present

## 2023-01-31 DIAGNOSIS — I110 Hypertensive heart disease with heart failure: Secondary | ICD-10-CM | POA: Diagnosis not present

## 2023-01-31 DIAGNOSIS — U071 COVID-19: Secondary | ICD-10-CM | POA: Diagnosis not present

## 2023-01-31 DIAGNOSIS — D649 Anemia, unspecified: Secondary | ICD-10-CM | POA: Diagnosis not present

## 2023-01-31 DIAGNOSIS — K5730 Diverticulosis of large intestine without perforation or abscess without bleeding: Secondary | ICD-10-CM | POA: Diagnosis not present

## 2023-01-31 DIAGNOSIS — E1142 Type 2 diabetes mellitus with diabetic polyneuropathy: Secondary | ICD-10-CM | POA: Diagnosis not present

## 2023-01-31 DIAGNOSIS — I951 Orthostatic hypotension: Secondary | ICD-10-CM | POA: Diagnosis not present

## 2023-01-31 DIAGNOSIS — I5032 Chronic diastolic (congestive) heart failure: Secondary | ICD-10-CM | POA: Diagnosis not present

## 2023-01-31 NOTE — Patient Instructions (Signed)
Visit Information  Thank you for taking time to visit with me today. Please don't hesitate to contact me if I can be of assistance to you.   Following are the goals we discussed today:  Contact Provider with health questions or concerns as needed Continue to take medications as prescribed. Continue to attend provider visits as scheduled Continue to eat healthy, lean meats, vegetables, fruits, avoid saturated and transfats Work with home health staff and perform exercises as recommended Continue to monitor Blood pressure as recommended.  Contact your insurance provider re: over the counter benefit if a new Blood pressure cuff needed; Also contact to see if you are eligible to received meals post hospitalization Contact number for Physician Referral Line: [phone]   Our next appointment is by telephone on 02/14/23 at 1:15 pm  Please call the care guide team at [phone] if you need to cancel or reschedule your appointment.   If you are experiencing a Mental Health or Behavioral Health Crisis or need someone to talk to, please call the Suicide and Crisis Lifeline: 988 call the [place] National Suicide Prevention Lifeline: 7013190567 or TTY: (608)842-9484 TTY 4400245318) to talk to a trained counselor call 1-800-273-TALK (toll free, 24 hour hotline)   Kathyrn Sheriff, RN, MSN, BSN, CCM Care Management Coordinator [phone]

## 2023-01-31 NOTE — Patient Outreach (Signed)
Care Coordination   01/31/2023 Name: [name] MRN: [id number] DOB: 10-20-1955   Care Coordination Outreach Attempts:  An unsuccessful telephone outreach was attempted for a scheduled appointment today.  Follow Up Plan:  Additional outreach attempts will be made to offer the patient care coordination information and services.   Encounter Outcome:  No Answer   Care Coordination Interventions:  No, not indicated    Kathyrn Sheriff, RN, MSN, BSN, CCM Care Management Coordinator [phone]

## 2023-01-31 NOTE — Patient Outreach (Signed)
Care Coordination   Follow Up Visit Note   01/31/2023 Name: [name] MRN: [id number] DOB: August 04, 1955  [name] is a 67 y.o. year old female who sees Myrlene Broker, MD for primary care. I spoke with  [name] by phone today.  What matters to the patients health and wellness today?  Admitted 01/15/23-01/25/23 with dizziness, transaminitis, covid, pna. Patient reports she is improving slowly. She states she is active with home health. Patient expressed concerns that she was not made aware sooner of issues with her liver. Patient has an upcoming appointment with primary provider for follow up 02/04/23  Goals Addressed             This Visit's Progress    Care coordination activities       Interventions Today    Flowsheet Row Most Recent Value  Chronic Disease   Chronic disease during today's visit Other  [admitted 9/28-10/8/24 Transaminitis, Covid, PNA treated for UTI.]  General Interventions   General Interventions Discussed/Reviewed General Interventions Reviewed, Doctor Visits, Communication with  [Evaluation of current treatment plan for health condition and patient's adherence to plan.]  Doctor Visits Discussed/Reviewed Doctor Visits Reviewed  [reviewed upcoming/scheduled appointments]  Communication with Social Work  [BSW updated on patient status]  Exercise Interventions   Exercise Discussed/Reviewed Exercise Discussed  [confirmed with patient that she has had a start of care visit with home health agency. per after visit summary increase activity slowly]  Education Interventions   Education Provided Provided Education  [discussed importance of open communication with provider and any health questions/concerns with provider as needed.]  Provided Verbal Education On Medication, When to see the doctor, Mental Health/Coping with Illness, Insurance Plans  [active listending and support. advised to take medications as prescribed, attend provider  appointments as scheduled. provided contact number for provider physician referral line. encouraged contact insurance for new BP monitor if needed.]  Mental Health Interventions   Mental Health Discussed/Reviewed Mental Health Discussed  [active listening and support. encouraged patient to take one day at a time and encouraged open communication with providers.]  Nutrition Interventions   Nutrition Discussed/Reviewed Nutrition Discussed  [discussed low salt diet]  Pharmacy Interventions   Pharmacy Dicussed/Reviewed Pharmacy Topics Reviewed, Medications and their functions  [reinforced indications for guiafenison and dextromethorphan. and advised to avoid taking tussin DM and guiafinisin at the same time.]  Safety Interventions   Safety Discussed/Reviewed Fall Risk, Safety Discussed, Home Safety  Home Safety Assistive Devices  [active with Home health]            SDOH assessments and interventions completed:  No  Care Coordination Interventions:  Yes, provided   Follow up plan: Follow up call scheduled for 02/14/23    Encounter Outcome:  Patient Visit Completed   Kathyrn Sheriff, RN, MSN, BSN, CCM Care Management Coordinator [phone]

## 2023-02-01 ENCOUNTER — Ambulatory Visit (INDEPENDENT_AMBULATORY_CARE_PROVIDER_SITE_OTHER): Payer: Medicare HMO | Admitting: Licensed Clinical Social Worker

## 2023-02-01 DIAGNOSIS — F331 Major depressive disorder, recurrent, moderate: Secondary | ICD-10-CM

## 2023-02-01 DIAGNOSIS — G4733 Obstructive sleep apnea (adult) (pediatric): Secondary | ICD-10-CM | POA: Diagnosis not present

## 2023-02-01 DIAGNOSIS — E1142 Type 2 diabetes mellitus with diabetic polyneuropathy: Secondary | ICD-10-CM | POA: Diagnosis not present

## 2023-02-01 DIAGNOSIS — I5032 Chronic diastolic (congestive) heart failure: Secondary | ICD-10-CM | POA: Diagnosis not present

## 2023-02-01 DIAGNOSIS — I951 Orthostatic hypotension: Secondary | ICD-10-CM | POA: Diagnosis not present

## 2023-02-01 DIAGNOSIS — U071 COVID-19: Secondary | ICD-10-CM | POA: Diagnosis not present

## 2023-02-01 DIAGNOSIS — I110 Hypertensive heart disease with heart failure: Secondary | ICD-10-CM | POA: Diagnosis not present

## 2023-02-01 DIAGNOSIS — D649 Anemia, unspecified: Secondary | ICD-10-CM | POA: Diagnosis not present

## 2023-02-01 DIAGNOSIS — J1282 Pneumonia due to coronavirus disease 2019: Secondary | ICD-10-CM | POA: Diagnosis not present

## 2023-02-01 DIAGNOSIS — K5730 Diverticulosis of large intestine without perforation or abscess without bleeding: Secondary | ICD-10-CM | POA: Diagnosis not present

## 2023-02-01 NOTE — Telephone Encounter (Signed)
Verbal orders has been given to April.

## 2023-02-01 NOTE — Progress Notes (Signed)
[facility] Counselor/Therapist Progress Note  Patient ID: [name], MRN: [id number]    Date: 02/01/23  Time Spent: 0205  pm - 0250 pm : 45 Minutes  Treatment Type: Individual Therapy.  Reported Symptoms: Symptoms of depression related to medical issues and life changes.  Mental Status Exam: Appearance:  Casual     Behavior: Appropriate  Motor: Normal  Speech/Language:  Slow  Affect: Depressed  Mood: depressed  Thought process: normal  Thought content:   WNL  Sensory/Perceptual disturbances:   WNL  Orientation: oriented to person, place, time/date, situation, day of week, month of year, and year  Attention: Good  Concentration: Good  Memory: WNL  Fund of knowledge:  Good  Insight:   Good  Judgment:  Good  Impulse Control: Good   Risk Assessment: Danger to Self:  No Self-injurious Behavior: No Danger to Others: No Duty to Warn:no Physical Aggression / Violence:No  Access to Firearms a concern: No  Gang Involvement:No   Subjective:   [name] participated from home, via video, and consented to treatment. Therapist participated from office. We met online due to patient request.  Patient presented for her session identifying she has been hospitalized. Patient was tearful and upset at her recent experience with  her PCP. Patient reported making multiple calls and having multiple appointments when she wasn't feeling well. Patient reports that they did not complete a COVID test and later she was hospitalized fro both COVID and Pneumonia. She reports that the ED doctor also reminded her that her labs were all over the place and according to her records they had been for a while. Patient stated she was looking for a new PCP and wasn't able to find any with openings anytime soon. Clinician provided information about the Primary Care Clinic at [facility] and offered a referral. Patient accepted and has an appointment on 02/15/2023 at 2:30 pm.Patient  reported an increase in her depression and feeling like she had been left to die. Clinician attempted to provide patient with verbal support and encouragement.    Interventions: Cognitive Behavioral Therapy and Solution-Oriented/Positive Psychology  Diagnosis: Major Depressive Disorder, recurrent episode, moderate   Plan: [name]  is to use CBT, mindfulness and coping skills to help manage decrease symptoms associated with their diagnosis.   Long-term goal:   [name] will reduce overall level, frequency, and intensity of the feelings of depression evidenced by decreased irritability, negative self talk, and helpless feelings from 6 to 7 days/week to 0 to 2 days/week per client report for at least 3 consecutive months. Treatment plan to be reviewed by 10/22/2023.  Short-term goal:  [name] will verbally express understanding of the relationship between feelings of depression and their impact on thinking patterns and behaviors. Verbalize an understanding of the role that distorted thinking plays in creating fears, excessive worry, and ruminations.  [name] MSW, LCSW DATE: 02/01/2023

## 2023-02-02 DIAGNOSIS — U071 COVID-19: Secondary | ICD-10-CM | POA: Diagnosis not present

## 2023-02-02 DIAGNOSIS — E1142 Type 2 diabetes mellitus with diabetic polyneuropathy: Secondary | ICD-10-CM | POA: Diagnosis not present

## 2023-02-02 DIAGNOSIS — G4733 Obstructive sleep apnea (adult) (pediatric): Secondary | ICD-10-CM | POA: Diagnosis not present

## 2023-02-02 DIAGNOSIS — D649 Anemia, unspecified: Secondary | ICD-10-CM | POA: Diagnosis not present

## 2023-02-02 DIAGNOSIS — J1282 Pneumonia due to coronavirus disease 2019: Secondary | ICD-10-CM | POA: Diagnosis not present

## 2023-02-02 DIAGNOSIS — I110 Hypertensive heart disease with heart failure: Secondary | ICD-10-CM | POA: Diagnosis not present

## 2023-02-02 DIAGNOSIS — I951 Orthostatic hypotension: Secondary | ICD-10-CM | POA: Diagnosis not present

## 2023-02-02 DIAGNOSIS — K5730 Diverticulosis of large intestine without perforation or abscess without bleeding: Secondary | ICD-10-CM | POA: Diagnosis not present

## 2023-02-02 DIAGNOSIS — I5032 Chronic diastolic (congestive) heart failure: Secondary | ICD-10-CM | POA: Diagnosis not present

## 2023-02-04 ENCOUNTER — Telehealth: Payer: Self-pay | Admitting: Internal Medicine

## 2023-02-04 ENCOUNTER — Inpatient Hospital Stay: Payer: Medicare HMO | Admitting: Internal Medicine

## 2023-02-04 DIAGNOSIS — I110 Hypertensive heart disease with heart failure: Secondary | ICD-10-CM | POA: Diagnosis not present

## 2023-02-04 DIAGNOSIS — I951 Orthostatic hypotension: Secondary | ICD-10-CM | POA: Diagnosis not present

## 2023-02-04 DIAGNOSIS — G4733 Obstructive sleep apnea (adult) (pediatric): Secondary | ICD-10-CM | POA: Diagnosis not present

## 2023-02-04 DIAGNOSIS — I5032 Chronic diastolic (congestive) heart failure: Secondary | ICD-10-CM | POA: Diagnosis not present

## 2023-02-04 DIAGNOSIS — U071 COVID-19: Secondary | ICD-10-CM | POA: Diagnosis not present

## 2023-02-04 DIAGNOSIS — K5730 Diverticulosis of large intestine without perforation or abscess without bleeding: Secondary | ICD-10-CM | POA: Diagnosis not present

## 2023-02-04 DIAGNOSIS — J1282 Pneumonia due to coronavirus disease 2019: Secondary | ICD-10-CM | POA: Diagnosis not present

## 2023-02-04 DIAGNOSIS — D649 Anemia, unspecified: Secondary | ICD-10-CM | POA: Diagnosis not present

## 2023-02-04 DIAGNOSIS — E1142 Type 2 diabetes mellitus with diabetic polyneuropathy: Secondary | ICD-10-CM | POA: Diagnosis not present

## 2023-02-04 NOTE — Telephone Encounter (Signed)
I see another provider authorized verbals for this patient. She will need visit within 30 days of start of service or we cannot sign these due to no face to face to justify. Please schedule.

## 2023-02-04 NOTE — Telephone Encounter (Signed)
Tresa Endo from [facility] called wanting to let Dr.Crawford know that the pt has gain weight on the 15th she was 246 and as of today she is 253 with no swelling but increase of SOB.

## 2023-02-08 ENCOUNTER — Telehealth: Payer: Self-pay | Admitting: Internal Medicine

## 2023-02-08 ENCOUNTER — Telehealth: Payer: Self-pay

## 2023-02-08 ENCOUNTER — Ambulatory Visit: Payer: Self-pay

## 2023-02-08 DIAGNOSIS — I951 Orthostatic hypotension: Secondary | ICD-10-CM | POA: Diagnosis not present

## 2023-02-08 DIAGNOSIS — G4733 Obstructive sleep apnea (adult) (pediatric): Secondary | ICD-10-CM | POA: Diagnosis not present

## 2023-02-08 DIAGNOSIS — D649 Anemia, unspecified: Secondary | ICD-10-CM | POA: Diagnosis not present

## 2023-02-08 DIAGNOSIS — K5730 Diverticulosis of large intestine without perforation or abscess without bleeding: Secondary | ICD-10-CM | POA: Diagnosis not present

## 2023-02-08 DIAGNOSIS — E1142 Type 2 diabetes mellitus with diabetic polyneuropathy: Secondary | ICD-10-CM | POA: Diagnosis not present

## 2023-02-08 DIAGNOSIS — I5032 Chronic diastolic (congestive) heart failure: Secondary | ICD-10-CM | POA: Diagnosis not present

## 2023-02-08 DIAGNOSIS — I110 Hypertensive heart disease with heart failure: Secondary | ICD-10-CM | POA: Diagnosis not present

## 2023-02-08 DIAGNOSIS — U071 COVID-19: Secondary | ICD-10-CM | POA: Diagnosis not present

## 2023-02-08 DIAGNOSIS — J1282 Pneumonia due to coronavirus disease 2019: Secondary | ICD-10-CM | POA: Diagnosis not present

## 2023-02-08 NOTE — Patient Outreach (Signed)
Care Coordination   Collaboration  Visit Note   02/08/2023 Name: [name] MRN: [id number] DOB: 1955/09/27  [name] is a 67 y.o. year old female who sees Jessica Broker, MD for primary care. No patient contact was made during this encounter.   Collaboration note: Communication with [name], BSW, who reports she spoke with patient today who sounded weak. Per review of chart, Home health nurse contacted Primary provider office to update on status. Primary provider to follow up. Per BSW, patient aware to contact provider office if needed prior to receiving a return call.     Goals Addressed             This Visit's Progress    Care coordination activities       Interventions Today    Flowsheet Row Most Recent Value  General Interventions   General Interventions Discussed/Reviewed Communication with  Communication with Social Work  [collaboration: Designer, fashion/clothing with RNCM regarding patient status.]            SDOH assessments and interventions completed:  No  Care Coordination Interventions:  Yes, provided   Follow up plan:  RNCM will continue to follow.    Encounter Outcome:  Patient Visit Completed   Jessica Sheriff, RN, MSN, BSN, CCM Care Management Coordinator [phone]

## 2023-02-08 NOTE — Telephone Encounter (Signed)
April from [facility] called wanting Dr. Okey Dupre to know pt is having slight arm pain and numbness and also in her right leg. April also stated pt have been feeling a little bit better today.    Best call back [phone]

## 2023-02-08 NOTE — Patient Outreach (Signed)
Care Coordination   Follow Up Visit Note   02/08/2023 Name: [name] MRN: [id number] DOB: [birth year]  [name] is a 67 y.o. year old female who sees Myrlene Broker, MD for primary care. I spoke with  [name] by phone today.  What matters to the patients health and wellness today?  I contacted the patient to follow up on care coordination needs. The patient reported she did not sleep well last night due to arm and leg pain. Upon chart review it is noted home health contacted the patients primary care provider this morning to report symptoms. Advised the patient to contact her provider if assistance is needed prior to receiving a return call.   SDOH assessments and interventions completed:  No     Care Coordination Interventions:  No, not indicated    Follow up plan:  SW will reschedule today's planned call.    Encounter Outcome:  Patient Visit Completed   Bevelyn Ngo, BSW, CDP [facility]  [facility], [facility] Social Worker Direct Dial: [phone]  Fax: [phone]

## 2023-02-08 NOTE — Telephone Encounter (Signed)
Looks like a stroke was just ruled out earlier this month.  Okay to wait for Dr. Okey Dupre

## 2023-02-09 DIAGNOSIS — I951 Orthostatic hypotension: Secondary | ICD-10-CM | POA: Diagnosis not present

## 2023-02-09 DIAGNOSIS — E1142 Type 2 diabetes mellitus with diabetic polyneuropathy: Secondary | ICD-10-CM | POA: Diagnosis not present

## 2023-02-09 DIAGNOSIS — J1282 Pneumonia due to coronavirus disease 2019: Secondary | ICD-10-CM | POA: Diagnosis not present

## 2023-02-09 DIAGNOSIS — G4733 Obstructive sleep apnea (adult) (pediatric): Secondary | ICD-10-CM | POA: Diagnosis not present

## 2023-02-09 DIAGNOSIS — I110 Hypertensive heart disease with heart failure: Secondary | ICD-10-CM | POA: Diagnosis not present

## 2023-02-09 DIAGNOSIS — D649 Anemia, unspecified: Secondary | ICD-10-CM | POA: Diagnosis not present

## 2023-02-09 DIAGNOSIS — U071 COVID-19: Secondary | ICD-10-CM | POA: Diagnosis not present

## 2023-02-09 DIAGNOSIS — I5032 Chronic diastolic (congestive) heart failure: Secondary | ICD-10-CM | POA: Diagnosis not present

## 2023-02-09 DIAGNOSIS — K5730 Diverticulosis of large intestine without perforation or abscess without bleeding: Secondary | ICD-10-CM | POA: Diagnosis not present

## 2023-02-10 DIAGNOSIS — I110 Hypertensive heart disease with heart failure: Secondary | ICD-10-CM | POA: Diagnosis not present

## 2023-02-10 DIAGNOSIS — I5032 Chronic diastolic (congestive) heart failure: Secondary | ICD-10-CM | POA: Diagnosis not present

## 2023-02-10 DIAGNOSIS — J1282 Pneumonia due to coronavirus disease 2019: Secondary | ICD-10-CM | POA: Diagnosis not present

## 2023-02-10 DIAGNOSIS — I951 Orthostatic hypotension: Secondary | ICD-10-CM | POA: Diagnosis not present

## 2023-02-10 DIAGNOSIS — D649 Anemia, unspecified: Secondary | ICD-10-CM | POA: Diagnosis not present

## 2023-02-10 DIAGNOSIS — K5730 Diverticulosis of large intestine without perforation or abscess without bleeding: Secondary | ICD-10-CM | POA: Diagnosis not present

## 2023-02-10 DIAGNOSIS — U071 COVID-19: Secondary | ICD-10-CM | POA: Diagnosis not present

## 2023-02-10 DIAGNOSIS — G4733 Obstructive sleep apnea (adult) (pediatric): Secondary | ICD-10-CM | POA: Diagnosis not present

## 2023-02-10 DIAGNOSIS — E1142 Type 2 diabetes mellitus with diabetic polyneuropathy: Secondary | ICD-10-CM | POA: Diagnosis not present

## 2023-02-11 ENCOUNTER — Encounter (HOSPITAL_COMMUNITY): Payer: Self-pay

## 2023-02-11 ENCOUNTER — Emergency Department (HOSPITAL_COMMUNITY)
Admission: EM | Admit: 2023-02-11 | Discharge: 2023-02-11 | Disposition: A | Discharge status: Home / Self Care | Payer: Medicare HMO | Attending: Emergency Medicine | Admitting: Emergency Medicine

## 2023-02-11 ENCOUNTER — Emergency Department (HOSPITAL_BASED_OUTPATIENT_CLINIC_OR_DEPARTMENT_OTHER): Payer: Medicare HMO

## 2023-02-11 ENCOUNTER — Emergency Department (HOSPITAL_COMMUNITY): Payer: Medicare HMO

## 2023-02-11 ENCOUNTER — Ambulatory Visit: Payer: Self-pay

## 2023-02-11 ENCOUNTER — Telehealth: Payer: Self-pay

## 2023-02-11 DIAGNOSIS — R079 Chest pain, unspecified: Secondary | ICD-10-CM | POA: Insufficient documentation

## 2023-02-11 DIAGNOSIS — M79604 Pain in right leg: Secondary | ICD-10-CM | POA: Diagnosis not present

## 2023-02-11 DIAGNOSIS — R0789 Other chest pain: Secondary | ICD-10-CM | POA: Diagnosis not present

## 2023-02-11 DIAGNOSIS — R0602 Shortness of breath: Secondary | ICD-10-CM | POA: Insufficient documentation

## 2023-02-11 DIAGNOSIS — M25551 Pain in right hip: Secondary | ICD-10-CM | POA: Insufficient documentation

## 2023-02-11 DIAGNOSIS — I10 Essential (primary) hypertension: Secondary | ICD-10-CM | POA: Diagnosis not present

## 2023-02-11 DIAGNOSIS — R918 Other nonspecific abnormal finding of lung field: Secondary | ICD-10-CM | POA: Diagnosis not present

## 2023-02-11 DIAGNOSIS — J189 Pneumonia, unspecified organism: Secondary | ICD-10-CM | POA: Diagnosis not present

## 2023-02-11 LAB — BASIC METABOLIC PANEL
Anion gap: 8 (ref 5–15)
BUN: 17 mg/dL (ref 8–23)
CO2: 19 mmol/L — ABNORMAL LOW (ref 22–32)
Calcium: 7.9 mg/dL — ABNORMAL LOW (ref 8.9–10.3)
Chloride: 115 mmol/L — ABNORMAL HIGH (ref 98–111)
Creatinine, Ser: 0.8 mg/dL (ref 0.44–1.00)
GFR, Estimated: >60 mL/min (ref >60)
Glucose, Bld: 90 mg/dL (ref 70–99)
Potassium: 5 mmol/L (ref 3.5–5.1)
Sodium: 142 mmol/L (ref 135–145)

## 2023-02-11 LAB — CBC
HCT: 34.6 % — ABNORMAL LOW (ref 36.0–46.0)
Hemoglobin: 10.6 g/dL — ABNORMAL LOW (ref 12.0–15.0)
MCH: 26.8 pg (ref 26.0–34.0)
MCHC: 30.6 g/dL (ref 30.0–36.0)
MCV: 87.4 fL (ref 80.0–100.0)
Platelets: 274 10*3/uL (ref 150–400)
RBC: 3.96 MIL/uL (ref 3.87–5.11)
RDW: 18.8 % — ABNORMAL HIGH (ref 11.5–15.5)
WBC: 5.9 10*3/uL (ref 4.0–10.5)
nRBC: 0 % (ref 0.0–0.2)

## 2023-02-11 LAB — TROPONIN I (HIGH SENSITIVITY)
Troponin I (High Sensitivity): 5 ng/L (ref <18)
Troponin I (High Sensitivity): 5 ng/L (ref <18)

## 2023-02-11 LAB — BRAIN NATRIURETIC PEPTIDE: B Natriuretic Peptide: 146.2 pg/mL — ABNORMAL HIGH (ref 0.0–100.0)

## 2023-02-11 NOTE — Discharge Instructions (Signed)
Your lab work appears to demonstrate a very mild heart failure exacerbation.  Please take Lasix 20 mg for the next 3 days and then start weighing yourself daily to make sure you do not regain the weight and follow-up with your primary doctor at that time.  Return with any change in your symptoms.

## 2023-02-11 NOTE — Patient Outreach (Signed)
Care Coordination   Follow Up Visit Note   02/11/2023 Name: [name] MRN: [id number] DOB: 1956-03-01  [name] is a 67 y.o. year old female who sees Jessica Broker, MD for primary care. I spoke with  [name] by phone today.  What matters to the patients health and wellness today?  RNCM called to follow up. [name] voice sounds weak. She is oriented to self place situation. She reports, "I am not feeling that good". She states the nurse with [name] saw her on Tuesday and reports the Physical therapist saw her on yesterday. She states she was able to do some exercises, but also had right leg pain. Occasional cough noted- patient reports has had every since she got out of hospital dry cough. She states she is able to ambulates with a walker- Denies any falls. "I am doing ok. I am just kind of tired today. I think I am just tired. I did not rest too well last night cause my leg was hurting". She states she was able to tolerate the exercises that the therapist did with her on yesterday. She states, the last two night has not slept that well. She reports appetite is "ok. It's so so". She states she is eating meals prepared by friends. She states she is checking her blood sugar- Did not check today, but yesterday it was 190 before breakfast; BP 117/77. "I just feel kind of tired". She reports taking an oxycodone this morning around 8 am and adds pain medication has this affect on her adding that is why she does not take pain medication often.  She states it has not helped her leg pain-she states it hurts when she stands on it. She states she is drinking water and staying hydrated. [name] declines RNCM's offer to assist with getting her an appointment. She states she will seek medical attention if she does not feel better by later today.  Goals Addressed             This Visit's Progress    Care coordination activities       Interventions Today     Flowsheet Row Most Recent Value  Chronic Disease   Chronic disease during today's visit Other  [(admitted 9/28-10/8/24 Transaminitis, Covid, PNA treated for UTI.]  General Interventions   General Interventions Discussed/Reviewed General Interventions Reviewed, Communication with  [advised patient to seek medical attention or go to the ED-declines RNCM to arrange same day appointment.]  Doctor Visits Discussed/Reviewed Doctor Visits Reviewed  [reviewed upcoming appointments]  Communication with PCP/Specialists  [update regarding patient status]  Education Interventions   Provided Verbal Education On --  [advsied to seek medical attention or call 911 if condition does not improve or worsens.]  Nutrition Interventions   Nutrition Discussed/Reviewed Nutrition Discussed  Pharmacy Interventions   Pharmacy Dicussed/Reviewed Pharmacy Topics Discussed  [discussed possiblity of liver taking longer to clear pain medication and other medications-encouraged patient to discuss with provider]  Safety Interventions   Safety Discussed/Reviewed Safety Discussed, Safety Reviewed  [confirmed still active with [facility]]            SDOH assessments and interventions completed:  No  Care Coordination Interventions:  Yes, provided   Follow up plan: Follow up call scheduled for 02/14/23    Encounter Outcome:  Patient Visit Completed   Jessica Sheriff, RN, MSN, BSN, CCM Care Management Coordinator [phone]

## 2023-02-11 NOTE — Telephone Encounter (Signed)
Called pt and lvm also stated that if she is having leg numbness or arm numbness then she would need to go to the ER

## 2023-02-11 NOTE — ED Triage Notes (Signed)
Pt is coming in for chest pain and SHOB that she expresses started this morning. Medic reports she has wheezing as well. Has not been taking  her lasix since being discharged for pneumonia. She has had an improvement of chest pain with  nitroglycerin x 1, pain went from 4/10 to 3/10. Pt did get ASA prior to arrival.  Medic vitals   20g left ac  112/78 76hr 95%ra 20

## 2023-02-11 NOTE — ED Provider Triage Note (Signed)
Emergency Medicine Provider Triage Evaluation Note  [name] , a 67 y.o. female  was evaluated in triage.  Pt complains of shortness of breath and right leg pain.  Patient says that she woke up this morning and felt short of breath.  She has also been having some pain in her right leg.  She has not had fever.  Patient recently hospitalized for pneumonia.  Patient received nitroglycerin from EMS.  Review of Systems    Physical Exam  There were no vitals taken for this visit. Gen:   Awake, no distress   Resp:  Normal effort  MSK:   Moves extremities without difficulty  Other:  Lower extremities-no edema.  Regular rate and rhythm.  Medical Decision Making  Medically screening exam initiated at 4:09 PM.  Appropriate orders placed.  [name] was informed that the remainder of the evaluation will be completed by another provider, this initial triage assessment does not replace that evaluation, and the importance of remaining in the ED until their evaluation is complete.  Chest pain workup, possibly mild volume overload.  Ultrasound for DVT.   Anders Simmonds T, DO 02/11/23 1610

## 2023-02-11 NOTE — Telephone Encounter (Signed)
New arm numbness with leg numbness should have gone to nurse triage. Was she triaged? If not we need more information about symptoms, timing, current condition so we can make a decision but any new stroke symptoms should be sent to ER to rule out stroke.

## 2023-02-11 NOTE — Patient Outreach (Signed)
Care Coordination   Follow Up Visit Note   02/11/2023 Name: [name] MRN: [id number] DOB: 1956-01-15  [name] is a 67 y.o. year old female who sees Myrlene Broker, MD for primary care. I spoke with  [name] by phone today.  What matters to the patients health and wellness today?  The patient reports ongoing leg pain and swelling which is causing difficulty with standing and walking. The patient reports she is also having chest pain. SW noted the patient is short of breath while speaking. The patient reports she has someone a text message  asking if they will transport her to the hospital. Advised the patient that due to her having chest pain we needed to dial 911. The patient requested time to shower prior to calling 911. SW outreached EMS out of concern for the patient due to shortness of breath, leg swelling, and chest pain. EMS routed to patients home. SW attempted to contact the patient to advise EMS were on the way, voice message left.   SDOH assessments and interventions completed:  No     Care Coordination Interventions:  Yes, provided   Interventions Today    Flowsheet Row Most Recent Value  Chronic Disease   Chronic disease during today's visit Other  [leg pain, chest pain]  General Interventions   General Interventions Discussed/Reviewed Communication with  [Dialed 911 due to reports of chest pain, shortness of breath, and leg swelling]  Communication with RN  [Collaboration with RN Care Manager to discuss patients self reported symptoms. Discussed SW has dialed 911 out of concern for the patient.]        Follow up plan:  The care management team will continue to follow.    Encounter Outcome:  Patient Visit Completed   Bevelyn Ngo, BSW, CDP [facility]  [facility], [facility] Social Worker Direct Dial: [phone]  Fax: [phone]

## 2023-02-11 NOTE — Progress Notes (Signed)
Right lower extremity venous duplex completed. Results in Epic.   [facility], RVT

## 2023-02-11 NOTE — ED Provider Notes (Signed)
[place] EMERGENCY DEPARTMENT AT [facility] Provider Note   CSN: [id number] Arrival date & time: 02/11/23  1555     History Chief Complaint  Patient presents with   Chest Pain    HPI [name] is a 67 y.o. female presenting for chest pain shortness of breath and right hip pain.  History of similar.  Follows in the outpatient setting for heart failure.  Recently admitted for pneumonia stopped on her diuretic due to dehydration. Has gained approximately 5 pounds since discharge feels mildly dyspneic on exertion no symptoms at rest. Otherwise denies fevers chills nausea vomiting chest pain or cough at this time. Patient's recorded medical, surgical, social, medication list and allergies were reviewed in the Snapshot window as part of the initial history.   Review of Systems   Review of Systems  Constitutional:  Negative for chills and fever.  HENT:  Negative for ear pain and sore throat.   Eyes:  Negative for pain and visual disturbance.  Respiratory:  Positive for shortness of breath. Negative for cough.   Cardiovascular:  Negative for chest pain and palpitations.  Gastrointestinal:  Negative for abdominal pain and vomiting.  Genitourinary:  Negative for dysuria and hematuria.  Musculoskeletal:  Negative for arthralgias and back pain.  Skin:  Negative for color change and rash.  Neurological:  Negative for seizures and syncope.  All other systems reviewed and are negative.   Physical Exam Updated Vital Signs BP 118/74   Pulse 64   Temp 98.2 F (36.8 C)   Resp 13   SpO2 97%  Physical Exam Constitutional:      General: She is not in acute distress.    Appearance: She is not ill-appearing or toxic-appearing.  HENT:     Head: Normocephalic and atraumatic.  Eyes:     Extraocular Movements: Extraocular movements intact.     Pupils: Pupils are equal, round, and reactive to light.  Cardiovascular:     Rate and Rhythm: Normal rate.  Pulmonary:      Effort: No respiratory distress.  Abdominal:     General: Abdomen is flat.  Musculoskeletal:        General: No swelling, deformity or signs of injury.     Cervical back: Normal range of motion. No rigidity.  Skin:    General: Skin is warm and dry.  Neurological:     General: No focal deficit present.     Mental Status: She is alert and oriented to person, place, and time.  Psychiatric:        Mood and Affect: Mood normal.      ED Course/ Medical Decision Making/ A&P Clinical Course as of 02/11/23 2123  Fri Feb 11, 2023  1759 Pending CX and 2nd trop. Maybe CTA? [CC]    Clinical Course User Index [CC] Glyn Ade, MD    Procedures Procedures   Medications Ordered in ED Medications - No data to display  Medical Decision Making:    [name] is a 68 y.o. female who presented to the ED today with mostly shortness of breath and some intermittent chest pain detailed above.     Handoff received from EMS.  Patient placed on continuous vitals and telemetry monitoring while in ED which was reviewed periodically.   Complete initial physical exam performed, notably the patient  was HDS in NAD.      Reviewed and confirmed nursing documentation for past medical history, family history, social history.    Initial Assessment:  Long conversation with the patient regarding ongoing care and management.  She appears to have a mild heart failure exacerbation on her workup today.  Considered ACS PE and alternative diagnosis but this seems mostly inconsistent with her presentation.  Shared medical decision making with the patient and she stated that she feels comfortable with p.o. Lasix at home does not want to be up tonight urinating frequently.  Given negative eval negative evidence of DVT or other pathology patient stable for outpatient care management.  She is having some ongoing right hip pain that seems to be more musculoskeletal consistent with septic arthritis given her  presentation and lab findings today. ACS considered less likely based on negative serial troponins and stable EKG from prior as well as lack of chest pain on serial reassessments.. Disposition:  I have considered need for hospitalization, however, considering all of the above, I believe this patient is stable for discharge at this time.  Patient/family educated about specific return precautions for given chief complaint and symptoms.  Patient/family educated about follow-up with PCP and cardiology.     Patient/family expressed understanding of return precautions and need for follow-up. Patient spoken to regarding all imaging and laboratory results and appropriate follow up for these results. All education provided in verbal form with additional information in written form. Time was allowed for answering of patient questions. Patient discharged.    Emergency Department Medication Summary:   Medications - No data to display      Clinical Impression:  1. Chest pain, unspecified type   2. SOB (shortness of breath)      Discharge   Final Clinical Impression(s) / ED Diagnoses Final diagnoses:  Chest pain, unspecified type  SOB (shortness of breath)    Rx / DC Orders ED Discharge Orders     None         Glyn Ade, MD 02/11/23 2123

## 2023-02-11 NOTE — ED Notes (Signed)
Awaiting patient from lobby 

## 2023-02-11 NOTE — Patient Instructions (Signed)
Visit Information  Thank you for taking time to visit with me today. Please don't hesitate to contact me if I can be of assistance to you.   Following are the goals we discussed today:  Seek medical assistance or emergency care if condition does not improve or worsens Discuss medications with your provider.  Attend provider visits as scheduled Continue to eat healthy, lean meats, vegetables, fruits, avoid saturated and transfats Continue to check blood pressure and blood sugar as recommended by provider and contact provider if questions or concerns  Our next appointment is by telephone on 02/14/23 at 1:15 pm  Please call the care guide team at [phone] if you need to cancel or reschedule your appointment.   If you are experiencing a Mental Health or Behavioral Health Crisis or need someone to talk to, please call the Suicide and Crisis Lifeline: 988 call the [place] National Suicide Prevention Lifeline: 480-151-3586 or TTY: (514) 582-8382 TTY 769-012-7286) to talk to a trained counselor call 1-800-273-TALK (toll free, 24 hour hotline)  Kathyrn Sheriff, RN, MSN, BSN, CCM Care Management Coordinator [phone]

## 2023-02-14 ENCOUNTER — Ambulatory Visit: Payer: Self-pay

## 2023-02-14 NOTE — Patient Instructions (Signed)
Visit Information  Thank you for taking time to visit with me today. Please don't hesitate to contact me if I can be of assistance to you.   Following are the goals we discussed today:  Continue to take medications as prescribed. Continue to attend provider visits as scheduled Continue to monitor weights and blood pressure. Take readings with you to provider office visits Continue to eat healthy, lean meats, vegetables, fruits, avoid saturated and transfats    Our next appointment is by telephone on 02/22/23 at 2:00 pm  Please call the care guide team at [phone] if you need to cancel or reschedule your appointment.   If you are experiencing a Mental Health or Behavioral Health Crisis or need someone to talk to, please call the Suicide and Crisis Lifeline: 988 call the [place] National Suicide Prevention Lifeline: 914-425-0598 or TTY: 479-198-7800 TTY 7320743271) to talk to a trained counselor call 1-800-273-TALK (toll free, 24 hour hotline)  Kathyrn Sheriff, RN, MSN, BSN, CCM Care Management Coordinator [phone]

## 2023-02-14 NOTE — Patient Outreach (Signed)
Care Coordination   Follow Up Visit Note   02/14/2023 Name: [name] MRN: [id number] DOB: Nov 30, 1955  [name] is a 67 y.o. year old female who sees Myrlene Broker, MD for primary care. I spoke with  [name] by phone today.  What matters to the patients health and wellness today?  ED visit on 10/25 with chest pain, SOB. She states she took her diuretic when she got home as instructed by ER provider and has lost 7 pounds. She questions why one time she is told not to take diuretic and another time she is told to take the diuretic. She reports she still does not feel well. Patient has a new primary appointment scheduled for tomorrow. Patient states she will get someone to take her to that appointments.  Goals Addressed             This Visit's Progress    Care coordination activities       Interventions Today    Flowsheet Row Most Recent Value  Chronic Disease   Chronic disease during today's visit Other  [admitted 9/28-10/8/24 Transaminitis, Covid, PNA treated for UTI. ER visit 10/25 chest pain, SOB]  General Interventions   General Interventions Discussed/Reviewed General Interventions Reviewed, Communication with, Doctor Visits  [Evaluation of current treatment plan for health condition and patient's adherence to plan.]  Doctor Visits Discussed/Reviewed Doctor Visits Discussed, PCP, Specialist  PCP/Specialist Visits Compliance with follow-up visit  [reviewed upcoming scheduled appointments and transportation to these appointments]  Communication with Social Work  [re: transportation options for patient]  Education Interventions   Education Provided Provided Education  [discussed daily weights,  how lasix works as a diuretic to get off extra fluid. but also discussed that it can also cause a person to become too dry/dehyration.]  Provided Verbal Education On Medication, When to see the doctor  [reinforced the importance of seeing provider  tomorrow for follow up/start of care and additional instructions. discussed transportation resources including [employer], [employer] and asking friends/family]  Pharmacy Interventions   Pharmacy Dicussed/Reviewed Pharmacy Topics Discussed            SDOH assessments and interventions completed:  No  Care Coordination Interventions:  Yes, provided   Follow up plan: Follow up call scheduled for 02/28/23    Encounter Outcome:  Patient Visit Completed   Kathyrn Sheriff, RN, MSN, BSN, CCM Care Management Coordinator [phone]

## 2023-02-15 ENCOUNTER — Encounter (HOSPITAL_BASED_OUTPATIENT_CLINIC_OR_DEPARTMENT_OTHER): Payer: Self-pay | Admitting: Family Medicine

## 2023-02-15 ENCOUNTER — Ambulatory Visit: Payer: Medicare HMO | Admitting: Nurse Practitioner

## 2023-02-15 ENCOUNTER — Ambulatory Visit (HOSPITAL_BASED_OUTPATIENT_CLINIC_OR_DEPARTMENT_OTHER): Payer: Medicare HMO | Admitting: Family Medicine

## 2023-02-15 VITALS — BP 129/67 | HR 71 | Ht 65.0 in | Wt 250.9 lb

## 2023-02-15 DIAGNOSIS — R0609 Other forms of dyspnea: Secondary | ICD-10-CM | POA: Diagnosis not present

## 2023-02-15 DIAGNOSIS — R7401 Elevation of levels of liver transaminase levels: Secondary | ICD-10-CM | POA: Diagnosis not present

## 2023-02-15 DIAGNOSIS — D649 Anemia, unspecified: Secondary | ICD-10-CM | POA: Diagnosis not present

## 2023-02-15 DIAGNOSIS — N3941 Urge incontinence: Secondary | ICD-10-CM | POA: Diagnosis not present

## 2023-02-15 DIAGNOSIS — J189 Pneumonia, unspecified organism: Secondary | ICD-10-CM

## 2023-02-15 DIAGNOSIS — I509 Heart failure, unspecified: Secondary | ICD-10-CM | POA: Insufficient documentation

## 2023-02-15 MED ORDER — LEVOFLOXACIN 750 MG PO TABS: 750.0000 mg | ORAL_TABLET | Freq: Every day | ORAL | 0 refills | Status: DC

## 2023-02-15 MED ORDER — FLUTICASONE-SALMETEROL 100-50 MCG/ACT IN AEPB: 1.0000 | INHALATION_SPRAY | Freq: Two times a day (BID) | RESPIRATORY_TRACT | 1 refills | Status: DC
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
## 2023-02-15 NOTE — Progress Notes (Unsigned)
Aleen Sells D.[facility] [address] [place] [address] Phone: [phone]   Assessment and Plan:     There are no diagnoses linked to this encounter.  ***   Pertinent previous records reviewed include ***   Follow Up: ***     Subjective:   I, Willowdean Luhmann, am serving as a Neurosurgeon for Doctor Richardean Sale   Chief Complaint: right knee pain    HPI:  04/01/2022 [name] is a 67 y.o. female who presents to [facility] at [facility] today for knee pain. Pt was previously seen by Dr. Katrinka Blazing on 02/25/22 for polyarthralgia, including R knee pain. Pt suffered a fall on last Thurs, 12/7, tripping on the leg of a chair, falling on her R side. Pt locates pain to midline of her neck, the anterior aspect of the L hip, and the anterior aspect of the R knee.    She had a right knee injection in early November with my partner Dr. Katrinka Blazing.   R Knee swelling: yes Mechanical symptoms: no Aggravates: walking Treatments tried: ice, heat, elevation, Tylenol, Gabapentin, Tramadol   Dx testing: 02/25/22 L-spine XR & Labs             02/06/20 R knee XR   Pertinent review of systems: No fevers or chills   Relevant historical information: Hypertension   04/08/2022 Patient states   02/16/2023 Patient states  Relevant Historical Information: Hypertension, fibromyalgia  Additional pertinent review of systems negative.   Current Outpatient Medications:    albuterol (PROVENTIL) (2.5 MG/3ML) 0.083% nebulizer solution, Take 3 mLs (2.5 mg total) by nebulization every 6 (six) hours as needed for wheezing or shortness of breath., Disp: 100 mL, Rfl: 1   albuterol (VENTOLIN HFA) 108 (90 Base) MCG/ACT inhaler, INHALE 2 PUFFS INTO THE LUNGS EVERY 6 HOURS AS NEEDED FOR WHEEZING OR SHORTNESS OF BREATH, Disp: 3 each, Rfl: 2   amLODipine (NORVASC) 10 MG tablet, Take 1 tablet (10 mg total) by mouth daily., Disp: 90 tablet, Rfl: 3    Ascorbic Acid (VITAMIN C) 1000 MG tablet, Take 1,000 mg by mouth daily., Disp: , Rfl:    aspirin EC 81 MG tablet, Take 1 tablet (81 mg total) by mouth daily. Swallow whole., Disp: 30 tablet, Rfl: 12   benzonatate (TESSALON) 100 MG capsule, Take 1 capsule (100 mg total) by mouth 3 (three) times daily as needed for cough., Disp: 30 capsule, Rfl: 0   buPROPion (WELLBUTRIN XL) 300 MG 24 hr tablet, Take 1 tablet (300 mg total) by mouth daily., Disp: 90 tablet, Rfl: 3   dextromethorphan-guaiFENesin (MUCINEX DM) 30-600 MG 12hr tablet, Take 1 tablet by mouth 2 (two) times daily as needed for cough., Disp: , Rfl:    ferrous sulfate 325 (65 FE) MG tablet, Take 1 tablet (325 mg total) by mouth daily with breakfast., Disp: 30 tablet, Rfl: 3   fluticasone-salmeterol (ADVAIR) 100-50 MCG/ACT AEPB, Inhale 1 puff into the lungs 2 (two) times daily., Disp: 60 each, Rfl: 1   gabapentin (NEURONTIN) 100 MG capsule, Take 2 capsules (200 mg total) by mouth 2 (two) times daily., Disp: , Rfl:    guaiFENesin (MUCINEX) 600 MG 12 hr tablet, Take 1 tablet (600 mg total) by mouth 2 (two) times daily., Disp: 30 tablet, Rfl: 0   ipratropium (ATROVENT) 0.03 % nasal spray, Place 2 sprays into both nostrils every 12 (twelve) hours. X 5-7 days (Patient taking differently: Place 2 sprays into both  nostrils as needed for rhinitis.), Disp: 30 mL, Rfl: 0   levofloxacin (LEVAQUIN) 750 MG tablet, Take 1 tablet (750 mg total) by mouth daily., Disp: 5 tablet, Rfl: 0   lipase/protease/amylase (CREON) 36000 UNITS CPEP capsule, Take 36,000 Units by mouth 4 (four) times daily as needed (Stomach Pain)., Disp: , Rfl:    loperamide (IMODIUM) 2 MG capsule, Take 1 capsule (2 mg total) by mouth as needed for diarrhea or loose stools., Disp: 30 capsule, Rfl: 0   MAGNESIUM PO, Take 1 tablet by mouth daily., Disp: , Rfl:    Menthol, Topical Analgesic, (BIOFREEZE EX), Apply 1 Application topically as needed (Pain)., Disp: , Rfl:    montelukast (SINGULAIR) 10  MG tablet, Take 1 tablet (10 mg total) by mouth at bedtime., Disp: 90 tablet, Rfl: 3   Multiple Vitamin (MULTI-VITAMIN) tablet, Take 1 tablet by mouth daily., Disp: , Rfl:    ondansetron (ZOFRAN) 4 MG tablet, Take 1 tablet (4 mg total) by mouth every 8 (eight) hours as needed for nausea or vomiting., Disp: 20 tablet, Rfl: 0   oxyCODONE (OXY IR/ROXICODONE) 5 MG immediate release tablet, Take 1 tablet (5 mg total) by mouth every 6 (six) hours as needed for severe pain or moderate pain., Disp: 15 tablet, Rfl: 0   PARoxetine (PAXIL) 20 MG tablet, Take 1 tablet (20 mg total) by mouth daily., Disp: 90 tablet, Rfl: 3   psyllium (HYDROCIL/METAMUCIL) 95 % PACK, Take 1 packet by mouth 2 (two) times daily. (Patient taking differently: Take 1 packet by mouth 2 (two) times daily as needed for mild constipation.), Disp: 240 each, Rfl:    traMADol (ULTRAM) 50 MG tablet, Take 2 tablets (100 mg total) by mouth 2 (two) times daily. (Patient taking differently: Take 50 mg by mouth 2 (two) times daily.), Disp: 120 tablet, Rfl: 5   traZODone (DESYREL) 50 MG tablet, Take 1 tablet (50 mg total) by mouth at bedtime as needed for sleep., Disp: 90 tablet, Rfl: 1   VITAMIN D PO, Take 1 capsule by mouth daily., Disp: , Rfl:    Objective:     There were no vitals filed for this visit.    There is no height or weight on file to calculate BMI.    Physical Exam:    ***   Electronically signed by:  Aleen Sells D.[facility] 4:20 PM 02/15/23

## 2023-02-16 ENCOUNTER — Ambulatory Visit: Payer: Medicare HMO | Admitting: Sports Medicine

## 2023-02-16 VITALS — BP 128/68 | HR 82 | Ht 65.0 in | Wt 250.0 lb

## 2023-02-16 DIAGNOSIS — M48062 Spinal stenosis, lumbar region with neurogenic claudication: Secondary | ICD-10-CM

## 2023-02-16 DIAGNOSIS — M5416 Radiculopathy, lumbar region: Secondary | ICD-10-CM

## 2023-02-16 LAB — CBC WITH DIFFERENTIAL/PLATELET
Basophils Absolute: 0 10*3/uL (ref 0.0–0.2)
Basos: 0 %
EOS (ABSOLUTE): 0.2 10*3/uL (ref 0.0–0.4)
Eos: 3 %
Hematocrit: 37.8 % (ref 34.0–46.6)
Hemoglobin: 11.7 g/dL (ref 11.1–15.9)
Immature Grans (Abs): 0 10*3/uL (ref 0.0–0.1)
Immature Granulocytes: 0 %
Lymphocytes Absolute: 2.3 10*3/uL (ref 0.7–3.1)
Lymphs: 30 %
MCH: 26.7 pg (ref 26.6–33.0)
MCHC: 31 g/dL — ABNORMAL LOW (ref 31.5–35.7)
MCV: 86 fL (ref 79–97)
Monocytes Absolute: 0.6 10*3/uL (ref 0.1–0.9)
Monocytes: 8 %
Neutrophils Absolute: 4.4 10*3/uL (ref 1.4–7.0)
Neutrophils: 59 %
Platelets: 314 10*3/uL (ref 150–450)
RBC: 4.39 x10E6/uL (ref 3.77–5.28)
RDW: 16.3 % — ABNORMAL HIGH (ref 11.7–15.4)
WBC: 7.5 10*3/uL (ref 3.4–10.8)

## 2023-02-16 LAB — COMPREHENSIVE METABOLIC PANEL
ALT: 35 [IU]/L — ABNORMAL HIGH (ref 0–32)
AST: 31 [IU]/L (ref 0–40)
Albumin: 4 g/dL (ref 3.9–4.9)
Alkaline Phosphatase: 122 [IU]/L — ABNORMAL HIGH (ref 44–121)
BUN/Creatinine Ratio: 17 (ref 12–28)
BUN: 19 mg/dL (ref 8–27)
Bilirubin Total: 0.3 mg/dL (ref 0.0–1.2)
CO2: 19 mmol/L — ABNORMAL LOW (ref 20–29)
Calcium: 8.9 mg/dL (ref 8.7–10.3)
Chloride: 109 mmol/L — ABNORMAL HIGH (ref 96–106)
Creatinine, Ser: 1.14 mg/dL — ABNORMAL HIGH (ref 0.57–1.00)
Globulin, Total: 2.6 g/dL (ref 1.5–4.5)
Glucose: 99 mg/dL (ref 70–99)
Potassium: 4.3 mmol/L (ref 3.5–5.2)
Sodium: 145 mmol/L — ABNORMAL HIGH (ref 134–144)
Total Protein: 6.6 g/dL (ref 6.0–8.5)
eGFR: 52 mL/min/{1.73_m2} — ABNORMAL LOW (ref >59)

## 2023-02-16 LAB — BRAIN NATRIURETIC PEPTIDE: BNP: 71.3 pg/mL (ref 0.0–100.0)

## 2023-02-16 MED ORDER — MELOXICAM 15 MG PO TABS: 15.0000 mg | ORAL_TABLET | Freq: Every day | ORAL | 0 refills | Status: DC

## 2023-02-16 NOTE — Patient Instructions (Signed)
-   Start meloxicam 15 mg daily x2 weeks.  If still having pain after 2 weeks, complete 3rd-week of meloxicam. May use remaining meloxicam as needed once daily for pain control.  Do not to use additional NSAIDs while taking meloxicam.  May use Tylenol [phone] mg 2 to 3 times a day for breakthrough pain. 4 week follow up

## 2023-02-18 ENCOUNTER — Emergency Department (HOSPITAL_COMMUNITY): Payer: Medicare HMO

## 2023-02-18 ENCOUNTER — Inpatient Hospital Stay (HOSPITAL_COMMUNITY)
Admission: EM | Admit: 2023-02-18 | Discharge: 2023-02-24 | DRG: 194 | Disposition: A | Discharge status: Home Health | Payer: Medicare HMO | Attending: Internal Medicine | Admitting: Internal Medicine

## 2023-02-18 ENCOUNTER — Telehealth: Payer: Self-pay

## 2023-02-18 DIAGNOSIS — Y95 Nosocomial condition: Secondary | ICD-10-CM | POA: Diagnosis not present

## 2023-02-18 DIAGNOSIS — R069 Unspecified abnormalities of breathing: Secondary | ICD-10-CM | POA: Diagnosis not present

## 2023-02-18 DIAGNOSIS — J984 Other disorders of lung: Secondary | ICD-10-CM | POA: Diagnosis not present

## 2023-02-18 DIAGNOSIS — D649 Anemia, unspecified: Secondary | ICD-10-CM | POA: Diagnosis not present

## 2023-02-18 DIAGNOSIS — Z9884 Bariatric surgery status: Secondary | ICD-10-CM | POA: Diagnosis not present

## 2023-02-18 DIAGNOSIS — Z820 Family history of epilepsy and other diseases of the nervous system: Secondary | ICD-10-CM

## 2023-02-18 DIAGNOSIS — K5730 Diverticulosis of large intestine without perforation or abscess without bleeding: Secondary | ICD-10-CM | POA: Diagnosis not present

## 2023-02-18 DIAGNOSIS — Z87891 Personal history of nicotine dependence: Secondary | ICD-10-CM

## 2023-02-18 DIAGNOSIS — Z7984 Long term (current) use of oral hypoglycemic drugs: Secondary | ICD-10-CM | POA: Diagnosis not present

## 2023-02-18 DIAGNOSIS — E119 Type 2 diabetes mellitus without complications: Secondary | ICD-10-CM | POA: Diagnosis not present

## 2023-02-18 DIAGNOSIS — I517 Cardiomegaly: Secondary | ICD-10-CM | POA: Diagnosis not present

## 2023-02-18 DIAGNOSIS — R0989 Other specified symptoms and signs involving the circulatory and respiratory systems: Secondary | ICD-10-CM | POA: Diagnosis not present

## 2023-02-18 DIAGNOSIS — U071 COVID-19: Secondary | ICD-10-CM | POA: Diagnosis not present

## 2023-02-18 DIAGNOSIS — Z8042 Family history of malignant neoplasm of prostate: Secondary | ICD-10-CM

## 2023-02-18 DIAGNOSIS — E1142 Type 2 diabetes mellitus with diabetic polyneuropathy: Secondary | ICD-10-CM | POA: Diagnosis not present

## 2023-02-18 DIAGNOSIS — E66813 Obesity, class 3: Secondary | ICD-10-CM | POA: Diagnosis present

## 2023-02-18 DIAGNOSIS — Z833 Family history of diabetes mellitus: Secondary | ICD-10-CM

## 2023-02-18 DIAGNOSIS — J189 Pneumonia, unspecified organism: Principal | ICD-10-CM | POA: Diagnosis present

## 2023-02-18 DIAGNOSIS — Z86718 Personal history of other venous thrombosis and embolism: Secondary | ICD-10-CM | POA: Diagnosis not present

## 2023-02-18 DIAGNOSIS — I110 Hypertensive heart disease with heart failure: Secondary | ICD-10-CM | POA: Diagnosis not present

## 2023-02-18 DIAGNOSIS — F321 Major depressive disorder, single episode, moderate: Secondary | ICD-10-CM | POA: Diagnosis not present

## 2023-02-18 DIAGNOSIS — Z79899 Other long term (current) drug therapy: Secondary | ICD-10-CM

## 2023-02-18 DIAGNOSIS — I5032 Chronic diastolic (congestive) heart failure: Secondary | ICD-10-CM | POA: Diagnosis present

## 2023-02-18 DIAGNOSIS — Z8616 Personal history of COVID-19: Secondary | ICD-10-CM | POA: Diagnosis not present

## 2023-02-18 DIAGNOSIS — Z860100 Personal history of colon polyps, unspecified: Secondary | ICD-10-CM

## 2023-02-18 DIAGNOSIS — I2510 Atherosclerotic heart disease of native coronary artery without angina pectoris: Secondary | ICD-10-CM | POA: Diagnosis not present

## 2023-02-18 DIAGNOSIS — G629 Polyneuropathy, unspecified: Secondary | ICD-10-CM

## 2023-02-18 DIAGNOSIS — Z9049 Acquired absence of other specified parts of digestive tract: Secondary | ICD-10-CM

## 2023-02-18 DIAGNOSIS — D638 Anemia in other chronic diseases classified elsewhere: Secondary | ICD-10-CM | POA: Diagnosis present

## 2023-02-18 DIAGNOSIS — Z791 Long term (current) use of non-steroidal anti-inflammatories (NSAID): Secondary | ICD-10-CM

## 2023-02-18 DIAGNOSIS — I700 Atherosclerosis of aorta: Secondary | ICD-10-CM | POA: Diagnosis not present

## 2023-02-18 DIAGNOSIS — J45909 Unspecified asthma, uncomplicated: Secondary | ICD-10-CM | POA: Diagnosis present

## 2023-02-18 DIAGNOSIS — Z821 Family history of blindness and visual loss: Secondary | ICD-10-CM

## 2023-02-18 DIAGNOSIS — I10 Essential (primary) hypertension: Secondary | ICD-10-CM | POA: Diagnosis not present

## 2023-02-18 DIAGNOSIS — E1140 Type 2 diabetes mellitus with diabetic neuropathy, unspecified: Secondary | ICD-10-CM | POA: Diagnosis not present

## 2023-02-18 DIAGNOSIS — E860 Dehydration: Secondary | ICD-10-CM | POA: Diagnosis present

## 2023-02-18 DIAGNOSIS — F32A Depression, unspecified: Secondary | ICD-10-CM | POA: Diagnosis not present

## 2023-02-18 DIAGNOSIS — Z95828 Presence of other vascular implants and grafts: Secondary | ICD-10-CM | POA: Diagnosis not present

## 2023-02-18 DIAGNOSIS — Z882 Allergy status to sulfonamides status: Secondary | ICD-10-CM

## 2023-02-18 DIAGNOSIS — Z8249 Family history of ischemic heart disease and other diseases of the circulatory system: Secondary | ICD-10-CM | POA: Diagnosis not present

## 2023-02-18 DIAGNOSIS — G4733 Obstructive sleep apnea (adult) (pediatric): Secondary | ICD-10-CM | POA: Diagnosis not present

## 2023-02-18 DIAGNOSIS — N179 Acute kidney failure, unspecified: Secondary | ICD-10-CM | POA: Diagnosis not present

## 2023-02-18 DIAGNOSIS — Z823 Family history of stroke: Secondary | ICD-10-CM

## 2023-02-18 DIAGNOSIS — R0602 Shortness of breath: Secondary | ICD-10-CM | POA: Diagnosis not present

## 2023-02-18 DIAGNOSIS — R531 Weakness: Secondary | ICD-10-CM | POA: Diagnosis not present

## 2023-02-18 DIAGNOSIS — R059 Cough, unspecified: Secondary | ICD-10-CM | POA: Diagnosis not present

## 2023-02-18 DIAGNOSIS — J1282 Pneumonia due to coronavirus disease 2019: Secondary | ICD-10-CM | POA: Diagnosis not present

## 2023-02-18 DIAGNOSIS — I951 Orthostatic hypotension: Secondary | ICD-10-CM | POA: Diagnosis not present

## 2023-02-18 DIAGNOSIS — G609 Hereditary and idiopathic neuropathy, unspecified: Secondary | ICD-10-CM | POA: Diagnosis not present

## 2023-02-18 DIAGNOSIS — Z981 Arthrodesis status: Secondary | ICD-10-CM

## 2023-02-18 DIAGNOSIS — M797 Fibromyalgia: Secondary | ICD-10-CM | POA: Diagnosis present

## 2023-02-18 DIAGNOSIS — Z888 Allergy status to other drugs, medicaments and biological substances status: Secondary | ICD-10-CM

## 2023-02-18 DIAGNOSIS — G6289 Other specified polyneuropathies: Secondary | ICD-10-CM | POA: Diagnosis not present

## 2023-02-18 DIAGNOSIS — Z6841 Body Mass Index (BMI) 40.0 and over, adult: Secondary | ICD-10-CM | POA: Diagnosis not present

## 2023-02-18 DIAGNOSIS — F419 Anxiety disorder, unspecified: Secondary | ICD-10-CM | POA: Diagnosis present

## 2023-02-18 DIAGNOSIS — Z7982 Long term (current) use of aspirin: Secondary | ICD-10-CM

## 2023-02-18 DIAGNOSIS — J069 Acute upper respiratory infection, unspecified: Secondary | ICD-10-CM

## 2023-02-18 DIAGNOSIS — Z7951 Long term (current) use of inhaled steroids: Secondary | ICD-10-CM

## 2023-02-18 DIAGNOSIS — Z841 Family history of disorders of kidney and ureter: Secondary | ICD-10-CM

## 2023-02-18 DIAGNOSIS — R918 Other nonspecific abnormal finding of lung field: Secondary | ICD-10-CM | POA: Diagnosis not present

## 2023-02-18 DIAGNOSIS — Z8261 Family history of arthritis: Secondary | ICD-10-CM

## 2023-02-18 LAB — COMPREHENSIVE METABOLIC PANEL
ALT: 27 U/L (ref 0–44)
AST: 26 U/L (ref 15–41)
Albumin: 3.2 g/dL — ABNORMAL LOW (ref 3.5–5.0)
Alkaline Phosphatase: 81 U/L (ref 38–126)
Anion gap: 7 (ref 5–15)
BUN: 20 mg/dL (ref 8–23)
CO2: 22 mmol/L (ref 22–32)
Calcium: 8 mg/dL — ABNORMAL LOW (ref 8.9–10.3)
Chloride: 113 mmol/L — ABNORMAL HIGH (ref 98–111)
Creatinine, Ser: 0.93 mg/dL (ref 0.44–1.00)
GFR, Estimated: >60 mL/min (ref >60)
Glucose, Bld: 97 mg/dL (ref 70–99)
Potassium: 3.6 mmol/L (ref 3.5–5.1)
Sodium: 142 mmol/L (ref 135–145)
Total Bilirubin: 0.7 mg/dL (ref 0.3–1.2)
Total Protein: 6.3 g/dL — ABNORMAL LOW (ref 6.5–8.1)

## 2023-02-18 LAB — CBC WITH DIFFERENTIAL/PLATELET
Abs Immature Granulocytes: 0.03 10*3/uL (ref 0.00–0.07)
Basophils Absolute: 0 10*3/uL (ref 0.0–0.1)
Basophils Relative: 0 %
Eosinophils Absolute: 0.1 10*3/uL (ref 0.0–0.5)
Eosinophils Relative: 2 %
HCT: 33.4 % — ABNORMAL LOW (ref 36.0–46.0)
Hemoglobin: 10.4 g/dL — ABNORMAL LOW (ref 12.0–15.0)
Immature Granulocytes: 0 %
Lymphocytes Relative: 26 %
Lymphs Abs: 2.1 10*3/uL (ref 0.7–4.0)
MCH: 26.9 pg (ref 26.0–34.0)
MCHC: 31.1 g/dL (ref 30.0–36.0)
MCV: 86.3 fL (ref 80.0–100.0)
Monocytes Absolute: 0.6 10*3/uL (ref 0.1–1.0)
Monocytes Relative: 8 %
Neutro Abs: 5 10*3/uL (ref 1.7–7.7)
Neutrophils Relative %: 64 %
Platelets: 236 10*3/uL (ref 150–400)
RBC: 3.87 MIL/uL (ref 3.87–5.11)
RDW: 18.6 % — ABNORMAL HIGH (ref 11.5–15.5)
WBC: 7.8 10*3/uL (ref 4.0–10.5)
nRBC: 0 % (ref 0.0–0.2)

## 2023-02-18 LAB — TROPONIN I (HIGH SENSITIVITY)
Troponin I (High Sensitivity): 4 ng/L (ref <18)
Troponin I (High Sensitivity): 4 ng/L (ref <18)

## 2023-02-18 LAB — BRAIN NATRIURETIC PEPTIDE: B Natriuretic Peptide: 55.1 pg/mL (ref 0.0–100.0)

## 2023-02-18 MED ORDER — VANCOMYCIN HCL IN DEXTROSE 1-5 GM/200ML-% IV SOLN
1000.0000 mg | Freq: Once | INTRAVENOUS | Status: AC
Start: 2023-02-19 — End: 2023-02-19
  Administered 2023-02-19: 1000 mg via INTRAVENOUS
  Filled 2023-02-18: qty 200

## 2023-02-18 MED ORDER — IPRATROPIUM-ALBUTEROL 0.5-2.5 (3) MG/3ML IN SOLN
3.0000 mL | Freq: Once | RESPIRATORY_TRACT | Status: AC
Start: 2023-02-18 — End: 2023-02-19
  Administered 2023-02-19: 3 mL via RESPIRATORY_TRACT
  Filled 2023-02-18: qty 3

## 2023-02-18 MED ORDER — IOHEXOL 350 MG/ML SOLN
75.0000 mL | Freq: Once | INTRAVENOUS | Status: AC | PRN
  Administered 2023-02-18: 75 mL via INTRAVENOUS

## 2023-02-18 MED ORDER — ASPIRIN 325 MG PO TABS: 325.0000 mg | ORAL_TABLET | Freq: Every day | ORAL | Status: DC

## 2023-02-18 MED ORDER — SODIUM CHLORIDE 0.9 % IV SOLN
2.0000 g | Freq: Once | INTRAVENOUS | Status: AC
  Administered 2023-02-19: 2 g via INTRAVENOUS
  Filled 2023-02-18: qty 12.5

## 2023-02-18 MED ORDER — VANCOMYCIN HCL IN DEXTROSE 1-5 GM/200ML-% IV SOLN
1000.0000 mg | Freq: Once | INTRAVENOUS | Status: AC
  Administered 2023-02-19: 1000 mg via INTRAVENOUS
  Filled 2023-02-18: qty 200

## 2023-02-18 NOTE — ED Provider Notes (Signed)
[place] EMERGENCY DEPARTMENT AT [facility] Provider Note   CSN: [id number] Arrival date & time: 02/18/23  1759     History {Add pertinent medical, surgical, social history, OB history to HPI:1} No chief complaint on file.   [name] is a 67 y.o. female who presents the emergency department via EMS for shortness of breath. History gathered at bedside from EMS as well as the patient.  I reviewed the patient's EMR.  She has a past medical history of diabetes, CHF, anxiety, asthma, hypertension, obstructive sleep apnea.  Patient was hospitalized at the beginning of October for elevated LFTs and abdominal pain.  During her visit she developed sudden aphasia confusion and facial droop.  She had stroke ruled out however had incidental finding of COVID positive COVID did test and community-acquired pneumonia. Patient was seen in the emergency department on 02/11/2023 for shortness of breath with apparent CHF exacerbation after having stopped her Lasix due to high dehydration with a 5 pound weight gain.  EMS reports that the patient called out for shortness of breath.  She was not found to be hypoxic but had increased work of breathing and tachypnea.  She was placed on 2 L for comfort.  EMS gave the patient 2 nebulizer treatments prior to arrival.  Patient was also coughing.  She reports progressively worsening shortness of breath and orthopnea as well as exertional dyspnea.  She denies any increased swelling in her lower extremities.    HPI     Home Medications Prior to Admission medications   Medication Sig Start Date End Date Taking? Authorizing Provider  albuterol (PROVENTIL) (2.5 MG/3ML) 0.083% nebulizer solution Take 3 mLs (2.5 mg total) by nebulization every 6 (six) hours as needed for wheezing or shortness of breath. 09/14/21   Margaretann Loveless, PA-C  albuterol (VENTOLIN HFA) 108 (90 Base) MCG/ACT inhaler INHALE 2 PUFFS INTO THE LUNGS EVERY 6 HOURS AS NEEDED FOR  WHEEZING OR SHORTNESS OF BREATH 02/07/20   Myrlene Broker, MD  amLODipine (NORVASC) 10 MG tablet Take 1 tablet (10 mg total) by mouth daily. 02/01/23   Marinda Elk, MD  Ascorbic Acid (VITAMIN C) 1000 MG tablet Take 1,000 mg by mouth daily. 11/07/10   [provider]  aspirin EC 81 MG tablet Take 1 tablet (81 mg total) by mouth daily. Swallow whole. 01/26/23   Pokhrel, Rebekah Chesterfield, MD  benzonatate (TESSALON) 100 MG capsule Take 1 capsule (100 mg total) by mouth 3 (three) times daily as needed for cough. 01/06/23   Waldon Merl, PA-C  buPROPion (WELLBUTRIN XL) 300 MG 24 hr tablet Take 1 tablet (300 mg total) by mouth daily. 12/27/22   Myrlene Broker, MD  dextromethorphan-guaiFENesin [facility] DM) 30-600 MG 12hr tablet Take 1 tablet by mouth 2 (two) times daily as needed for cough.    [provider]  ferrous sulfate 325 (65 FE) MG tablet Take 1 tablet (325 mg total) by mouth daily with breakfast. 01/18/23   Marinda Elk, MD  fluticasone-salmeterol (ADVAIR) 100-50 MCG/ACT AEPB Inhale 1 puff into the lungs 2 (two) times daily. 02/15/23   Caudle, Shelton Silvas, FNP  gabapentin (NEURONTIN) 100 MG capsule Take 2 capsules (200 mg total) by mouth 2 (two) times daily. 06/24/22   Meuth, Lina Sar, PA-C  guaiFENesin (MUCINEX) 600 MG 12 hr tablet Take 1 tablet (600 mg total) by mouth 2 (two) times daily. 01/25/23   Pokhrel, Rebekah Chesterfield, MD  ipratropium (ATROVENT) 0.03 % nasal spray Place 2  sprays into both nostrils every 12 (twelve) hours. X 5-7 days Patient taking differently: Place 2 sprays into both nostrils as needed for rhinitis. 09/14/21   Margaretann Loveless, PA-C  levofloxacin (LEVAQUIN) 750 MG tablet Take 1 tablet (750 mg total) by mouth daily. 02/15/23   Caudle, Shelton Silvas, FNP  lipase/protease/amylase (CREON) 36000 UNITS CPEP capsule Take 36,000 Units by mouth 4 (four) times daily as needed (Stomach Pain). 04/28/20   [provider]  loperamide (IMODIUM) 2 MG  capsule Take 1 capsule (2 mg total) by mouth as needed for diarrhea or loose stools. 06/24/22   Meuth, Lina Sar, PA-C  MAGNESIUM PO Take 1 tablet by mouth daily.    [provider]  meloxicam (MOBIC) 15 MG tablet Take 1 tablet (15 mg total) by mouth daily. 02/16/23   Richardean Sale, DO  Menthol, Topical Analgesic, (BIOFREEZE EX) Apply 1 Application topically as needed (Pain).    [provider]  montelukast (SINGULAIR) 10 MG tablet Take 1 tablet (10 mg total) by mouth at bedtime. 12/27/22   Myrlene Broker, MD  Multiple Vitamin (MULTI-VITAMIN) tablet Take 1 tablet by mouth daily.    [provider]  ondansetron (ZOFRAN) 4 MG tablet Take 1 tablet (4 mg total) by mouth every 8 (eight) hours as needed for nausea or vomiting. 12/27/22   Myrlene Broker, MD  oxyCODONE (OXY IR/ROXICODONE) 5 MG immediate release tablet Take 1 tablet (5 mg total) by mouth every 6 (six) hours as needed for severe pain or moderate pain. 01/25/23   Pokhrel, Rebekah Chesterfield, MD  PARoxetine (PAXIL) 20 MG tablet Take 1 tablet (20 mg total) by mouth daily. 12/27/22   Myrlene Broker, MD  psyllium (HYDROCIL/METAMUCIL) 95 % PACK Take 1 packet by mouth 2 (two) times daily. Patient taking differently: Take 1 packet by mouth 2 (two) times daily as needed for mild constipation. 06/24/22   Meuth, Brooke A, PA-C  traMADol (ULTRAM) 50 MG tablet Take 2 tablets (100 mg total) by mouth 2 (two) times daily. Patient taking differently: Take 50 mg by mouth 2 (two) times daily. 08/17/22   Myrlene Broker, MD  traZODone (DESYREL) 50 MG tablet Take 1 tablet (50 mg total) by mouth at bedtime as needed for sleep. 08/17/22   Myrlene Broker, MD  VITAMIN D PO Take 1 capsule by mouth daily.    [provider]      Allergies    Carafate [sucralfate] and Sulfonamide derivatives    Review of Systems   Review of Systems  Physical Exam Updated Vital Signs BP (!) 142/76   Pulse 71   Temp 98.1 F (36.7  C) (Oral)   Resp 18   SpO2 95%  Physical Exam Vitals and nursing note reviewed.  Constitutional:      General: She is not in acute distress.    Appearance: She is well-developed. She is obese. She is not diaphoretic.  HENT:     Head: Normocephalic and atraumatic.     Right Ear: External ear normal.     Left Ear: External ear normal.     Nose: Nose normal.     Mouth/Throat:     Mouth: Mucous membranes are moist.  Eyes:     General: No scleral icterus.    Conjunctiva/sclera: Conjunctivae normal.  Cardiovascular:     Rate and Rhythm: Regular rhythm. Tachycardia present.     Heart sounds:     No friction rub. No gallop.     Comments: Trace  edema bilateral lower extremities Pulmonary:     Effort: Pulmonary effort is normal. Tachypnea present. No accessory muscle usage, prolonged expiration or respiratory distress.     Breath sounds: Decreased air movement present. Decreased breath sounds present. No wheezing, rhonchi or rales.  Abdominal:     General: Bowel sounds are normal. There is no distension.     Palpations: Abdomen is soft. There is no mass.     Tenderness: There is no abdominal tenderness. There is no guarding.  Musculoskeletal:     Cervical back: Normal range of motion.  Skin:    General: Skin is warm and dry.  Neurological:     Mental Status: She is alert and oriented to person, place, and time.  Psychiatric:        Behavior: Behavior normal.     ED Results / Procedures / Treatments   Labs (all labs ordered are listed, but only abnormal results are displayed) Labs Reviewed  CBC WITH DIFFERENTIAL/PLATELET - Abnormal; Notable for the following components:      Result Value   Hemoglobin 10.4 (*)    HCT 33.4 (*)    RDW 18.6 (*)    All other components within normal limits  COMPREHENSIVE METABOLIC PANEL - Abnormal; Notable for the following components:   Chloride 113 (*)    Calcium 8.0 (*)    Total Protein 6.3 (*)    Albumin 3.2 (*)    All other components  within normal limits  BRAIN NATRIURETIC PEPTIDE  I-STAT VENOUS BLOOD GAS, ED  TROPONIN I (HIGH SENSITIVITY)  TROPONIN I (HIGH SENSITIVITY)    EKG None  Radiology CT Angio Chest PE W and/or Wo Contrast  Result Date: 02/18/2023 CLINICAL DATA:  Pulmonary embolism (PE) suspected, high prob. Shortness of breath EXAM: CT ANGIOGRAPHY CHEST WITH CONTRAST TECHNIQUE: Multidetector CT imaging of the chest was performed using the standard protocol during bolus administration of intravenous contrast. Multiplanar CT image reconstructions and MIPs were obtained to evaluate the vascular anatomy. RADIATION DOSE REDUCTION: This exam was performed according to the departmental dose-optimization program which includes automated exposure control, adjustment of the mA and/or kV according to patient size and/or use of iterative reconstruction technique. CONTRAST:  75mL OMNIPAQUE IOHEXOL 350 MG/ML SOLN COMPARISON:  06/17/2022 FINDINGS: Cardiovascular: No filling defects in the pulmonary arteries to suggest pulmonary emboli. Cardiomegaly. Scattered coronary artery and aortic calcifications. Aorta normal caliber. Mediastinum/Nodes: No mediastinal, hilar, or axillary adenopathy. Trachea and esophagus are unremarkable. Thyroid unremarkable. Lungs/Pleura: Scarring in the lingula. Ground-glass airspace opacity more superiorly in the left upper lobe is new since prior study. Right lung clear. No effusions. Upper Abdomen: No acute findings Musculoskeletal: Chest wall soft tissues are unremarkable. Scoliosis. No acute bony abnormality. Review of the MIP images confirms the above findings. IMPRESSION: No evidence of pulmonary embolus. Small area of ground-glass opacity in the left upper lobe is new since prior study and could reflect early infiltrate/pneumonia. Lingular scarring. Coronary artery disease. Aortic Atherosclerosis (ICD10-I70.0). Electronically Signed   By: Charlett Nose M.D.   On: 02/18/2023 23:25   DG Chest Port 1  View  Result Date: 02/18/2023 CLINICAL DATA:  Shortness of breath EXAM: PORTABLE CHEST 1 VIEW COMPARISON:  02/11/2023 FINDINGS: Cardiomegaly, vascular congestion. No confluent opacities, effusions or overt edema. No acute bony abnormality. IMPRESSION: Cardiomegaly, vascular congestion. Electronically Signed   By: Charlett Nose M.D.   On: 02/18/2023 20:14    Procedures Procedures  {Document cardiac monitor, telemetry assessment procedure when appropriate:1}  Medications Ordered in  ED Medications  aspirin tablet 325 mg (has no administration in time range)  ipratropium-albuterol (DUONEB) 0.5-2.5 (3) MG/3ML nebulizer solution 3 mL (has no administration in time range)  ceFEPIme (MAXIPIME) 1 g in sodium chloride 0.9 % 100 mL IVPB (has no administration in time range)  iohexol (OMNIPAQUE) 350 MG/ML injection 75 mL (75 mLs Intravenous Contrast Given 02/18/23 2236)    ED Course/ Medical Decision Making/ A&P Clinical Course as of 02/18/23 2354  Fri Feb 18, 2023  2351 Patient reevaluated at bedside.  She is coughing and wheezing.  Patient becomes markedly short of breath with even small amounts of movement in the bed. I reviewed patient's EMR.  She was treated while hospitalized with azithromycin and Rocephin for suspected community-acquired pneumonia.  Patient has 1 last dose of Levaquin at home which she did not take due to being evaluated in the emergency department.  At this point I suspect patient will need admission for failed antibiotic therapy given CT scan showing new groundglass opacity in the left upper lobe.  I have ordered antibiotics including vancomycin and cefepime.  I have also ordered a DuoNeb for continued wheezing and shortness of breath [AH]  2354 CBC with Differential(!) [AH]  2354 WBC: 7.8 [AH]  2354 Comprehensive metabolic panel(!) [AH]  2354 Troponin I (High Sensitivity) [AH]  2354 Brain natriuretic peptide [AH]    Clinical Course User Index [AH] Arthor Captain, PA-C   {    Click here for ABCD2, HEART and other calculatorsREFRESH Note before signing :1}                              Medical Decision Making Amount and/or Complexity of Data Reviewed Labs: ordered. Radiology: ordered.  Risk OTC drugs. Prescription drug management. Decision regarding hospitalization.   Initial evaluation: Patient here with shortness of breath. She appears hemodynamically stable without hypoxia. Difficulty hearing breath sounds but do not hear any overt wheezing or rhonchi.  Given her previous diagnosis of COVID-19 I would like to rule out pulmonary embolus.  Patient does not appear significantly volume overloaded suggesting acute CHF exacerbation.  Symptoms also do not seem consistent with asthma exacerbation.  Have also considered acute coronary syndrome.  Patient placed on oxygen and cardiac monitoring.   {Document critical care time when appropriate:1} {Document review of labs and clinical decision tools ie heart score, Chads2Vasc2 etc:1}  {Document your independent review of radiology images, and any outside records:1} {Document your discussion with family members, caretakers, and with consultants:1} {Document social determinants of health affecting pt's care:1} {Document your decision making why or why not admission, treatments were needed:1} Final Clinical Impression(s) / ED Diagnoses Final diagnoses:  HAP (hospital-acquired pneumonia)    Rx / DC Orders ED Discharge Orders     None

## 2023-02-18 NOTE — Patient Instructions (Signed)
Visit Information  Thank you for taking time to visit with me today. Please don't hesitate to contact me if I can be of assistance to you.   Following are the goals we discussed today:  Continue to take medications as prescribed. Continue to attend provider visits as scheduled Continue to eat healthy, lean meats, vegetables, fruits, avoid saturated and transfats Contact provider with health questions or concerns as needed Daily weights and record and monitor for fluid volume increase. Notify your provider with questions or concern Continue to participate with home health therapist as recommended.   Our next appointment is by telephone on 02/22/23 at 2:00 pm  Please call the care guide team at [phone] if you need to cancel or reschedule your appointment.   If you are experiencing a Mental Health or Behavioral Health Crisis or need someone to talk to, please call the Suicide and Crisis Lifeline: 988 call the [place] National Suicide Prevention Lifeline: 909-247-3469 or TTY: 865 269 5838 TTY 773-862-1555) to talk to a trained counselor call 1-800-273-TALK (toll free, 24 hour hotline)  Kathyrn Sheriff, RN, MSN, BSN, CCM Care Management Coordinator [phone]

## 2023-02-18 NOTE — ED Triage Notes (Signed)
Pt BIBA from home with c/o SOB, dx with COVID+ and pneumonia 10/25. Done 2 rounds of ABX. Weak, SOB, cough, using accessory muscle. Chest wall pain when coughing. Increased work of breathing. Pt had 2 neb treatments at home prior to calling EMS.   140/102-HR 68-94% RA-100% 4L [place]-CBG 109

## 2023-02-18 NOTE — Patient Outreach (Signed)
  Care Coordination   Follow Up Visit Note   02/18/2023 Name: [name] MRN: [id number] DOB: 20-Sep-1955  [name] is a 67 y.o. year old female who sees Caudle, Shelton Silvas, FNP for primary care. I spoke with  [name] by phone today.  What matters to the patients health and wellness today?  RNCM received request from Dr. Okey Dupre, patient needs to schedule appointment for home health orders. [name] states she made an appointment and then canceled it. She states she has changed Primary care Providers and has let her insurance company know. She has received a new insurance card with the new provider name. She also reports she has let previous provider know that she has changed Primary Providers. And she reports she has notified the home health agency as well. RNCM discussed with patient transitioning to another care manager who case manages for the new Practice-will try to complete a warm transfer next week.  Goals Addressed             This Visit's Progress    Care coordination activities       Interventions Today    Flowsheet Row Most Recent Value  Chronic Disease   Chronic disease during today's visit Other  [antibiotic for pneumonoa 02/15/23 per PCP. admitted 9/28-10/8/24 Transaminitis, Covid, PNA treated for UTI. ER visit 10/25 chest pain, SOB]  General Interventions   General Interventions Discussed/Reviewed Doctor Visits, General Interventions Reviewed  [Evaluation of current treatment plan for health condition and patient's adherence to plan. confirmed paitent has notified previous PCP, insurance company and home health agency of change in primary provider.]  Doctor Visits Discussed/Reviewed PCP  PCP/Specialist Visits Compliance with follow-up visit  [reviewed patient instructions per PCP visit on 02/15/23]  Education Interventions   Education Provided Provided Education  Provided Verbal Education On Medication, When to see the doctor, Other   [discussed importance of taking medicationa as prescribed and taking antibiotics as prescribed. discussed importance of  dialy weights and recording weights, encouraged to contact provider for health questions concerns or if condition does not improve.]  Pharmacy Interventions   Pharmacy Dicussed/Reviewed Pharmacy Topics Discussed  Safety Interventions   Safety Discussed/Reviewed Safety Reviewed  [continues to be active with home health. confirmed with patient that she has informed home health of change in primary care providers]            SDOH assessments and interventions completed:  No  Care Coordination Interventions:  Yes, provided   Follow up plan:  as previously scheduled    Encounter Outcome:  Patient Visit Completed   Kathyrn Sheriff, RN, MSN, BSN, CCM Care Management Coordinator [phone]

## 2023-02-19 ENCOUNTER — Encounter (HOSPITAL_COMMUNITY): Payer: Self-pay | Admitting: Family Medicine

## 2023-02-19 ENCOUNTER — Other Ambulatory Visit: Payer: Self-pay

## 2023-02-19 DIAGNOSIS — E66813 Obesity, class 3: Secondary | ICD-10-CM | POA: Diagnosis present

## 2023-02-19 DIAGNOSIS — Z87891 Personal history of nicotine dependence: Secondary | ICD-10-CM | POA: Diagnosis not present

## 2023-02-19 DIAGNOSIS — E119 Type 2 diabetes mellitus without complications: Secondary | ICD-10-CM

## 2023-02-19 DIAGNOSIS — Z8616 Personal history of COVID-19: Secondary | ICD-10-CM | POA: Diagnosis not present

## 2023-02-19 DIAGNOSIS — Z888 Allergy status to other drugs, medicaments and biological substances status: Secondary | ICD-10-CM | POA: Diagnosis not present

## 2023-02-19 DIAGNOSIS — F419 Anxiety disorder, unspecified: Secondary | ICD-10-CM | POA: Diagnosis present

## 2023-02-19 DIAGNOSIS — J189 Pneumonia, unspecified organism: Secondary | ICD-10-CM | POA: Diagnosis present

## 2023-02-19 DIAGNOSIS — Z7984 Long term (current) use of oral hypoglycemic drugs: Secondary | ICD-10-CM | POA: Diagnosis not present

## 2023-02-19 DIAGNOSIS — G609 Hereditary and idiopathic neuropathy, unspecified: Secondary | ICD-10-CM

## 2023-02-19 DIAGNOSIS — F321 Major depressive disorder, single episode, moderate: Secondary | ICD-10-CM | POA: Diagnosis not present

## 2023-02-19 DIAGNOSIS — J45909 Unspecified asthma, uncomplicated: Secondary | ICD-10-CM | POA: Diagnosis present

## 2023-02-19 DIAGNOSIS — I5032 Chronic diastolic (congestive) heart failure: Secondary | ICD-10-CM | POA: Diagnosis present

## 2023-02-19 DIAGNOSIS — J069 Acute upper respiratory infection, unspecified: Secondary | ICD-10-CM | POA: Diagnosis not present

## 2023-02-19 DIAGNOSIS — Z8249 Family history of ischemic heart disease and other diseases of the circulatory system: Secondary | ICD-10-CM | POA: Diagnosis not present

## 2023-02-19 DIAGNOSIS — Z6841 Body Mass Index (BMI) 40.0 and over, adult: Secondary | ICD-10-CM | POA: Diagnosis not present

## 2023-02-19 DIAGNOSIS — E860 Dehydration: Secondary | ICD-10-CM | POA: Diagnosis present

## 2023-02-19 DIAGNOSIS — Y95 Nosocomial condition: Secondary | ICD-10-CM | POA: Diagnosis present

## 2023-02-19 DIAGNOSIS — I110 Hypertensive heart disease with heart failure: Secondary | ICD-10-CM | POA: Diagnosis present

## 2023-02-19 DIAGNOSIS — G4733 Obstructive sleep apnea (adult) (pediatric): Secondary | ICD-10-CM | POA: Diagnosis present

## 2023-02-19 DIAGNOSIS — F32A Depression, unspecified: Secondary | ICD-10-CM | POA: Diagnosis present

## 2023-02-19 DIAGNOSIS — E1140 Type 2 diabetes mellitus with diabetic neuropathy, unspecified: Secondary | ICD-10-CM | POA: Diagnosis present

## 2023-02-19 DIAGNOSIS — M797 Fibromyalgia: Secondary | ICD-10-CM | POA: Diagnosis present

## 2023-02-19 DIAGNOSIS — Z95828 Presence of other vascular implants and grafts: Secondary | ICD-10-CM | POA: Diagnosis not present

## 2023-02-19 DIAGNOSIS — Z882 Allergy status to sulfonamides status: Secondary | ICD-10-CM | POA: Diagnosis not present

## 2023-02-19 DIAGNOSIS — D638 Anemia in other chronic diseases classified elsewhere: Secondary | ICD-10-CM | POA: Diagnosis present

## 2023-02-19 DIAGNOSIS — N179 Acute kidney failure, unspecified: Secondary | ICD-10-CM | POA: Diagnosis not present

## 2023-02-19 DIAGNOSIS — I10 Essential (primary) hypertension: Secondary | ICD-10-CM | POA: Diagnosis not present

## 2023-02-19 DIAGNOSIS — Z86718 Personal history of other venous thrombosis and embolism: Secondary | ICD-10-CM | POA: Diagnosis not present

## 2023-02-19 DIAGNOSIS — Z9884 Bariatric surgery status: Secondary | ICD-10-CM | POA: Diagnosis not present

## 2023-02-19 HISTORY — DX: Pneumonia, unspecified organism: J18.9

## 2023-02-19 LAB — CBC
HCT: 34.2 % — ABNORMAL LOW (ref 36.0–46.0)
Hemoglobin: 10.5 g/dL — ABNORMAL LOW (ref 12.0–15.0)
MCH: 26.9 pg (ref 26.0–34.0)
MCHC: 30.7 g/dL (ref 30.0–36.0)
MCV: 87.5 fL (ref 80.0–100.0)
Platelets: 251 10*3/uL (ref 150–400)
RBC: 3.91 MIL/uL (ref 3.87–5.11)
RDW: 18.5 % — ABNORMAL HIGH (ref 11.5–15.5)
WBC: 7.4 10*3/uL (ref 4.0–10.5)
nRBC: 0 % (ref 0.0–0.2)

## 2023-02-19 MED ORDER — PANCRELIPASE (LIP-PROT-AMYL) 12000-38000 UNITS PO CPEP: 36000.0000 [IU] | ORAL_CAPSULE | Freq: Four times a day (QID) | ORAL | Status: DC | PRN

## 2023-02-19 MED ORDER — HYDROCOD POLI-CHLORPHE POLI ER 10-8 MG/5ML PO SUER
5.0000 mL | Freq: Once | ORAL | Status: AC
  Administered 2023-02-19: 5 mL via ORAL
  Filled 2023-02-19: qty 5

## 2023-02-19 MED ORDER — FERROUS SULFATE 325 (65 FE) MG PO TABS
325.0000 mg | ORAL_TABLET | Freq: Every day | ORAL | Status: DC
  Administered 2023-02-19 – 2023-02-24 (×6): 325 mg via ORAL
  Filled 2023-02-19 (×6): qty 1

## 2023-02-19 MED ORDER — TRAMADOL HCL 50 MG PO TABS
50.0000 mg | ORAL_TABLET | Freq: Two times a day (BID) | ORAL | Status: DC
  Administered 2023-02-19 – 2023-02-24 (×9): 50 mg via ORAL
  Filled 2023-02-19 (×11): qty 1

## 2023-02-19 MED ORDER — HYDROCOD POLI-CHLORPHE POLI ER 10-8 MG/5ML PO SUER
5.0000 mL | Freq: Two times a day (BID) | ORAL | Status: DC | PRN
  Administered 2023-02-20 – 2023-02-22 (×3): 5 mL via ORAL
  Filled 2023-02-19 (×3): qty 5

## 2023-02-19 MED ORDER — LORATADINE 10 MG PO TABS
10.0000 mg | ORAL_TABLET | Freq: Every day | ORAL | Status: DC
  Administered 2023-02-19 – 2023-02-24 (×6): 10 mg via ORAL
  Filled 2023-02-19 (×6): qty 1

## 2023-02-19 MED ORDER — SODIUM CHLORIDE 0.9 % IV SOLN
2.0000 g | Freq: Three times a day (TID) | INTRAVENOUS | Status: DC
  Administered 2023-02-19 – 2023-02-20 (×4): 2 g via INTRAVENOUS
  Filled 2023-02-19 (×5): qty 12.5

## 2023-02-19 MED ORDER — PAROXETINE HCL 20 MG PO TABS
20.0000 mg | ORAL_TABLET | Freq: Every day | ORAL | Status: DC
  Administered 2023-02-19 – 2023-02-24 (×6): 20 mg via ORAL
  Filled 2023-02-19 (×6): qty 1

## 2023-02-19 MED ORDER — GUAIFENESIN ER 600 MG PO TB12
600.0000 mg | ORAL_TABLET | Freq: Two times a day (BID) | ORAL | Status: DC
  Administered 2023-02-19 – 2023-02-24 (×11): 600 mg via ORAL
  Filled 2023-02-19 (×11): qty 1

## 2023-02-19 MED ORDER — BENZONATATE 100 MG PO CAPS
100.0000 mg | ORAL_CAPSULE | Freq: Three times a day (TID) | ORAL | Status: AC
  Administered 2023-02-19 – 2023-02-20 (×6): 100 mg via ORAL
  Filled 2023-02-19 (×6): qty 1

## 2023-02-19 MED ORDER — MELOXICAM 15 MG PO TABS
15.0000 mg | ORAL_TABLET | Freq: Every day | ORAL | Status: DC
  Administered 2023-02-19 – 2023-02-24 (×6): 15 mg via ORAL
  Filled 2023-02-19 (×6): qty 1

## 2023-02-19 MED ORDER — BUPROPION HCL ER (XL) 150 MG PO TB24
300.0000 mg | ORAL_TABLET | Freq: Every day | ORAL | Status: DC
  Administered 2023-02-19 – 2023-02-24 (×6): 300 mg via ORAL
  Filled 2023-02-19 (×6): qty 2

## 2023-02-19 MED ORDER — MENTHOL 3 MG MT LOZG: 1.0000 | LOZENGE | Freq: Once | OROMUCOSAL | Status: DC

## 2023-02-19 MED ORDER — FLUTICASONE FUROATE-VILANTEROL 100-25 MCG/ACT IN AEPB
1.0000 | INHALATION_SPRAY | Freq: Every day | RESPIRATORY_TRACT | Status: DC
  Administered 2023-02-19 – 2023-02-24 (×6): 1 via RESPIRATORY_TRACT
  Filled 2023-02-19: qty 28

## 2023-02-19 MED ORDER — ASPIRIN 81 MG PO TBEC
81.0000 mg | DELAYED_RELEASE_TABLET | Freq: Every day | ORAL | Status: DC
  Administered 2023-02-19 – 2023-02-24 (×6): 81 mg via ORAL
  Filled 2023-02-19 (×6): qty 1

## 2023-02-19 MED ORDER — MONTELUKAST SODIUM 10 MG PO TABS
10.0000 mg | ORAL_TABLET | Freq: Every day | ORAL | Status: DC
  Administered 2023-02-19 – 2023-02-23 (×6): 10 mg via ORAL
  Filled 2023-02-19 (×6): qty 1

## 2023-02-19 MED ORDER — IPRATROPIUM-ALBUTEROL 0.5-2.5 (3) MG/3ML IN SOLN
3.0000 mL | Freq: Four times a day (QID) | RESPIRATORY_TRACT | Status: DC | PRN
  Administered 2023-02-20 – 2023-02-22 (×3): 3 mL via RESPIRATORY_TRACT
  Filled 2023-02-19 (×3): qty 3

## 2023-02-19 MED ORDER — ENOXAPARIN SODIUM 40 MG/0.4ML IJ SOSY
40.0000 mg | PREFILLED_SYRINGE | INTRAMUSCULAR | Status: DC
  Administered 2023-02-19 – 2023-02-24 (×6): 40 mg via SUBCUTANEOUS
  Filled 2023-02-19 (×6): qty 0.4

## 2023-02-19 MED ORDER — AMLODIPINE BESYLATE 10 MG PO TABS
10.0000 mg | ORAL_TABLET | Freq: Every day | ORAL | Status: DC
  Administered 2023-02-19 – 2023-02-24 (×6): 10 mg via ORAL
  Filled 2023-02-19: qty 1
  Filled 2023-02-19: qty 2
  Filled 2023-02-19 (×4): qty 1

## 2023-02-19 MED ORDER — GABAPENTIN 100 MG PO CAPS
200.0000 mg | ORAL_CAPSULE | Freq: Two times a day (BID) | ORAL | Status: DC
  Administered 2023-02-19 – 2023-02-24 (×12): 200 mg via ORAL
  Filled 2023-02-19 (×12): qty 2

## 2023-02-19 MED ORDER — TRAZODONE HCL 50 MG PO TABS
50.0000 mg | ORAL_TABLET | Freq: Every evening | ORAL | Status: DC | PRN
  Filled 2023-02-19: qty 1

## 2023-02-19 MED ORDER — VANCOMYCIN HCL 1250 MG/250ML IV SOLN
1250.0000 mg | INTRAVENOUS | Status: DC
  Administered 2023-02-19: 1250 mg via INTRAVENOUS
  Filled 2023-02-19: qty 250

## 2023-02-19 MED ORDER — BENZONATATE 100 MG PO CAPS
100.0000 mg | ORAL_CAPSULE | Freq: Three times a day (TID) | ORAL | Status: DC | PRN
  Administered 2023-02-21 (×3): 100 mg via ORAL
  Filled 2023-02-19 (×3): qty 1

## 2023-02-19 MED ORDER — OXYCODONE HCL 5 MG PO TABS
5.0000 mg | ORAL_TABLET | Freq: Four times a day (QID) | ORAL | Status: DC | PRN
  Administered 2023-02-19 (×2): 5 mg via ORAL
  Filled 2023-02-19 (×3): qty 1

## 2023-02-19 MED ORDER — ACETAMINOPHEN 325 MG PO TABS: 650.0000 mg | ORAL_TABLET | Freq: Four times a day (QID) | ORAL | Status: DC | PRN

## 2023-02-19 MED ORDER — VITAMIN C 500 MG PO TABS
1000.0000 mg | ORAL_TABLET | Freq: Every day | ORAL | Status: DC
  Administered 2023-02-19 – 2023-02-24 (×6): 1000 mg via ORAL
  Filled 2023-02-19 (×6): qty 2

## 2023-02-19 NOTE — Assessment & Plan Note (Signed)
-   Continue amlodipine ?

## 2023-02-19 NOTE — Progress Notes (Signed)
BNP improving. Patient currently hospitalized. Creatinine improved when compared to inpatient labs currently. Will continue to monitor and repeat at next appt post hospital admission. CBC stable.

## 2023-02-19 NOTE — Assessment & Plan Note (Signed)
Continue gabapentin.

## 2023-02-19 NOTE — Assessment & Plan Note (Signed)
Noted  

## 2023-02-19 NOTE — Assessment & Plan Note (Signed)
-   Controlled.  Most recent A1c of 5 .9

## 2023-02-19 NOTE — H&P (Signed)
History and Physical    Patient: [name] NFA:[id number] DOB: 1956/01/28 DOA: 02/18/2023 DOS: the patient was seen and examined on 02/19/2023 PCP: Hilbert Bible, FNP  Patient coming from: Home  Chief Complaint: Shortness of breath  HPI: [name] is a 67 y.o. female with medical history significant of exocrine pancreatic insufficiency, recurrent C. difficile infections, hypertension, chronic diastolic heart failure, gastric bypass, DVT, type 2 diabetes, OSA, depression and morbid obesity who presents with shortness of breath.  Patient recently hospitalized from 9/28 to 10/8 with community-acquired pneumonia and completed a course of antibiotics.  Also incidentally positive for COVID at that time.  She continued to have symptoms of fatigue and shortness of breath following discharge.  She started to take her as needed Lasix twice daily and had weight loss.  She then established with a new PCP on 02/14/2023 and was started on a 5-day course of Levaquin. However continued to feel short of breath even with talking so came to ED. Has been having dry cough, nausea and had episode of vomiting earlier.   On arrival to the ED, she was afebrile and, BP of 142/76 on room air.  CBC without leukocytosis, hemoglobin 10.4 which is her baseline.  CMP was unremarkable.  Previously elevated LFTs have now normalized.  CTA chest ruled out pulmonary embolism.  There was small area of groundglass opacity in the left upper lobe new from prior and could be early infiltrate/pneumonia.  She was started on IV vancomycin and Cefepime and hospitalist consulted for admission.  Review of Systems: As mentioned in the history of present illness. All other systems reviewed and are negative. Past Medical History:  Diagnosis Date   Allergy 2010   Anemia    takes Ferrous Sulfate daily   Anxiety    takes Citalopram daily   Arthritis    Asthma    2004-prior to gastric bypass and no problems  since   CHF (congestive heart failure) (HCC)    takes Lasix daily as needed   Chronic back pain    spondylolisthesis/stenosis/radiculopathy   Complication of anesthesia yrs ago   slow to wake up   Depression    Diabetes (HCC)    DVT (deep venous thrombosis) (HCC) 01/17/2013   past hx. -tx.5-6 yrs ago bilateral legs, occ. sporadic swelling, has IVC filter implanted   Dysrhythmia    "heart tends to flutter"   Fibromyalgia    Fracture    right foot and is in a cam boot   Gallstones    GERD (gastroesophageal reflux disease)    hx of-no meds now   Heart murmur    History of bronchitis 2012 or 2013   History of colon polyps    Hypertension    takes Metoprolol daily   Insomnia    takes Melatonin daily   Pelvic floor dysfunction    Peripheral neuropathy    Pneumonia 90's   hx of   S/P gastric bypass 2003   Sleep apnea    no cpap used in many yrs after weight lost-no machine now   Urinary frequency    Urinary urgency    Vitamin D deficiency    Past Surgical History:  Procedure Laterality Date   BALLOON DILATION N/A 01/31/2013   Procedure: BALLOON DILATION;  Surgeon: Willis Modena, MD;  Location: [facility];  Service: Endoscopy;  Laterality: N/A;   CARDIAC EVENT MONITOR  03/2019   Predominantly sinus rhythm.  Rates range from 48-124 bpm.  Average 74 bpm.  Frequent short bursts (3-15 beats) PAT/PSVT--not indicated as being symptomatic on diary.  Otherwise rare PACs and PVCs.   CARDIAC EVENT MONITOR  08/2021   14-Day Zio Patch: Predominantly sinus rhythm with rate range 45 to 135 bpm.  Average 73 bpm.  Occasional (1.3%) PACs with rare PVCs and rare PAC couplets.  24 episodes of atrial runs: Fastest was 11 beats at a rate of 184 bpm, longest was 13 beats with a rate of 92 bpm.  No sustained tachyarrhythmias or bradycardia arrhythmias.  No atrial fibrillation or flutter.   CARDIOPULMONARY EXERCISE STRESS TEST (CPX)  10/27/2021   Good effort despite dyspnea and wheezing.  Modified  Naughton protocol on treadmill.  Normal heart rate response.  No sustained arrhythmias.  Preexercise spirometry normal.  Low normal peak VO2. Low normal functional capacity with NO Clear Cardiopulmonary Limitation.  Overall limitation primarily due to obesity & deconditioning, along with a component of Diastolic Dysfunction   CHOLECYSTECTOMY  2007   COLONOSCOPY     CT CTA CORONARY W/CA SCORE W/CM &/OR WO/CM  09/21/2019   Coronary Calcium Score 0.  Normal RCA dominant coronary anatomy.  CAD RADS 1-minimal nonobstructive CAD (0-24%).  Consider nonatherosclerotic cause for chest pain.  Consider preventative therapy and risk factor modification.   DILATION AND CURETTAGE OF UTERUS  yrs ago   ESOPHAGOGASTRODUODENOSCOPY (EGD) WITH PROPOFOL  03/01/2012   Procedure: ESOPHAGOGASTRODUODENOSCOPY (EGD) WITH PROPOFOL;  Surgeon: Willis Modena, MD;  Location: [facility];  Service: Endoscopy;  Laterality: N/A;   ESOPHAGOGASTRODUODENOSCOPY (EGD) WITH PROPOFOL N/A 01/31/2013   Procedure: ESOPHAGOGASTRODUODENOSCOPY (EGD) WITH PROPOFOL;  Surgeon: Willis Modena, MD;  Location: [facility];  Service: Endoscopy;  Laterality: N/A;   ESOPHAGOGASTRODUODENOSCOPY (EGD) WITH PROPOFOL N/A 09/02/2015   Procedure: ESOPHAGOGASTRODUODENOSCOPY (EGD) WITH PROPOFOL;  Surgeon: Iva Boop, MD;  Location: [facility];  Service: Endoscopy;  Laterality: N/A;   FRACTURE SURGERY     GASTRIC BY-PASS  2004   HERNIA REPAIR  2005   INSERTION OF VENA CAVA FILTER  01/17/2013   inserted 2004- "abdomen"   LIGAMENT REPAIR Right 1987   Rt. knee scope   MAXIMUM ACCESS (MAS)POSTERIOR LUMBAR INTERBODY FUSION (PLIF) 2 LEVEL N/A 06/07/2014   Procedure: L4-5 L5-S1 FOR MAXIMUM ACCESS (MAS) POSTERIOR LUMBAR INTERBODY FUSION ;  Surgeon: Maeola Harman, MD;  Location: [facility];  Service: Neurosurgery;  Laterality: N/A;  L4-5 L5-S1 FOR MAXIMUM ACCESS (MAS) POSTERIOR LUMBAR INTERBODY FUSION    SPINE SURGERY  2017   TONSILLECTOMY     as child    TRANSTHORACIC ECHOCARDIOGRAM  12/2020   EF 55 to 60%.  No RWMA.  Mild LVH.  GR 1 DD-moderate LA dilation..  Mild MR..  Normal RV size/function.  Normal PAP.  Normal RAP. => Essentially stable from December 2020   Social History:  reports that she quit smoking about 13 years ago. Her smoking use included cigarettes. She started smoking about 23 years ago. She has a 5 pack-year smoking history. She has never used smokeless tobacco. She reports that she does not currently use alcohol. She reports that she does not use drugs.  Allergies  Allergen Reactions   Carafate [Sucralfate] Hives, Itching and Other (See Comments)    Angioedema      Sulfonamide Derivatives Rash    Syncope    Family History  Problem Relation Age of Onset   Diabetes Mother    Atrial fibrillation Mother    Hypertension Mother    Arthritis Mother    Heart disease Father  Unsure of details   Hypertension Father    Prostate cancer Father    Alzheimer's disease Father    Arthritis Father    Cancer Father    Kidney disease Brother    Cancer Brother    Healthy Sister    Diabetes Maternal Grandmother    Heart disease Maternal Grandmother    Vision loss Maternal Grandmother    Stroke Maternal Grandfather 50   Hypertension Maternal Grandfather    Hypertension Paternal Grandmother    Alzheimer's disease Paternal Grandmother    Colon cancer Neg Hx    Colon polyps Neg Hx    Stomach cancer Neg Hx    Esophageal cancer Neg Hx    Pancreatic cancer Neg Hx    Liver disease Neg Hx     Prior to Admission medications   Medication Sig Start Date End Date Taking? Authorizing Provider  albuterol (PROVENTIL) (2.5 MG/3ML) 0.083% nebulizer solution Take 3 mLs (2.5 mg total) by nebulization every 6 (six) hours as needed for wheezing or shortness of breath. 09/14/21  Yes Burnette, Alessandra Bevels, PA-C  amLODipine (NORVASC) 10 MG tablet Take 1 tablet (10 mg total) by mouth daily. 02/01/23  Yes Marinda Elk, MD  Ascorbic  Acid (VITAMIN C) 1000 MG tablet Take 1,000 mg by mouth daily. 11/07/10  Yes [provider]  aspirin EC 81 MG tablet Take 1 tablet (81 mg total) by mouth daily. Swallow whole. 01/26/23  Yes Pokhrel, Laxman, MD  benzonatate (TESSALON) 100 MG capsule Take 1 capsule (100 mg total) by mouth 3 (three) times daily as needed for cough. 01/06/23  Yes Waldon Merl, PA-C  buPROPion (WELLBUTRIN XL) 300 MG 24 hr tablet Take 1 tablet (300 mg total) by mouth daily. 12/27/22  Yes Myrlene Broker, MD  dextromethorphan-guaiFENesin [facility] DM) 30-600 MG 12hr tablet Take 1 tablet by mouth 2 (two) times daily as needed for cough.   Yes [provider]  ferrous sulfate 325 (65 FE) MG tablet Take 1 tablet (325 mg total) by mouth daily with breakfast. 01/18/23  Yes Marinda Elk, MD  fluticasone-salmeterol (ADVAIR) 100-50 MCG/ACT AEPB Inhale 1 puff into the lungs 2 (two) times daily. 02/15/23  Yes Caudle, Shelton Silvas, FNP  gabapentin (NEURONTIN) 100 MG capsule Take 2 capsules (200 mg total) by mouth 2 (two) times daily. 06/24/22  Yes Meuth, Brooke A, PA-C  guaiFENesin (MUCINEX) 600 MG 12 hr tablet Take 1 tablet (600 mg total) by mouth 2 (two) times daily. 01/25/23  Yes Pokhrel, Laxman, MD  ipratropium (ATROVENT) 0.03 % nasal spray Place 2 sprays into both nostrils every 12 (twelve) hours. X 5-7 days Patient taking differently: Place 2 sprays into both nostrils as needed for rhinitis. 09/14/21  Yes Margaretann Loveless, PA-C  levofloxacin (LEVAQUIN) 750 MG tablet Take 1 tablet (750 mg total) by mouth daily. 02/15/23  Yes Caudle, Shelton Silvas, FNP  lipase/protease/amylase (CREON) 36000 UNITS CPEP capsule Take 36,000 Units by mouth 4 (four) times daily as needed (Stomach Pain). 04/28/20  Yes [provider]  loperamide (IMODIUM) 2 MG capsule Take 1 capsule (2 mg total) by mouth as needed for diarrhea or loose stools. 06/24/22  Yes Meuth, Brooke A, PA-C  meloxicam (MOBIC) 15 MG tablet Take 1  tablet (15 mg total) by mouth daily. 02/16/23  Yes Richardean Sale, DO  Menthol, Topical Analgesic, (BIOFREEZE EX) Apply 1 Application topically as needed (Pain).   Yes [provider]  montelukast (SINGULAIR) 10 MG tablet Take 1 tablet (10  mg total) by mouth at bedtime. 12/27/22  Yes Myrlene Broker, MD  Multiple Vitamin (MULTI-VITAMIN) tablet Take 1 tablet by mouth daily.   Yes [provider]  ondansetron (ZOFRAN) 4 MG tablet Take 1 tablet (4 mg total) by mouth every 8 (eight) hours as needed for nausea or vomiting. 12/27/22  Yes Myrlene Broker, MD  oxyCODONE (OXY IR/ROXICODONE) 5 MG immediate release tablet Take 1 tablet (5 mg total) by mouth every 6 (six) hours as needed for severe pain or moderate pain. 01/25/23  Yes Pokhrel, Laxman, MD  PARoxetine (PAXIL) 20 MG tablet Take 1 tablet (20 mg total) by mouth daily. 12/27/22  Yes Myrlene Broker, MD  psyllium (HYDROCIL/METAMUCIL) 95 % PACK Take 1 packet by mouth 2 (two) times daily. Patient taking differently: Take 1 packet by mouth 2 (two) times daily as needed for mild constipation. 06/24/22  Yes Meuth, Brooke A, PA-C  traMADol (ULTRAM) 50 MG tablet Take 2 tablets (100 mg total) by mouth 2 (two) times daily. Patient taking differently: Take 50 mg by mouth 2 (two) times daily. 08/17/22  Yes Myrlene Broker, MD  traZODone (DESYREL) 50 MG tablet Take 1 tablet (50 mg total) by mouth at bedtime as needed for sleep. 08/17/22  Yes Myrlene Broker, MD  VITAMIN D PO Take 1 capsule by mouth daily.   Yes [provider]  albuterol (VENTOLIN HFA) 108 (90 Base) MCG/ACT inhaler INHALE 2 PUFFS INTO THE LUNGS EVERY 6 HOURS AS NEEDED FOR WHEEZING OR SHORTNESS OF BREATH 02/07/20   Myrlene Broker, MD  MAGNESIUM PO Take 1 tablet by mouth daily.    [provider]    Physical Exam: Vitals:   02/18/23 1939 02/18/23 1940 02/18/23 1940 02/19/23 0051  BP:      Pulse: 69 71    Resp:  18    Temp:    98.1 F (36.7 C) 98.2 F (36.8 C)  TempSrc:   Oral Oral  SpO2: 100% 95%     Constitutional: NAD, calm, comfortable, nontoxic appearing female laying upright in bed Eyes:  lids and conjunctivae normal ENMT: Mucous membranes are moist.  Neck: normal, supple Respiratory: clear to auscultation bilaterally, no wheezing, no crackles.  No accessory muscle use.  Appears to have labored respiration when speaking but still able to speak in full sentences.  On room air. Cardiovascular: Regular rate and rhythm, no murmurs / rubs / gallops. No extremity edema.  Abdomen: no tenderness, soft  musculoskeletal: no clubbing / cyanosis. No joint deformity upper and lower extremities.  Normal muscle tone.  Skin: no rashes, lesions, ulcers. No induration Neurologic: CN 2-12 grossly intact.  Psychiatric: Normal judgment and insight. Alert and oriented x 3. Normal mood. Data Reviewed:  See HPI  Assessment and Plan: * HCAP (healthcare-associated pneumonia) -Last hospitalized from 9/28 to 10/8 with community-acquired pneumonia.  On outpatient follow-up continue to have persistent pneumonia with symptoms of shortness of breath.  She has 1 more day left of a 5-day course of Levaquin.  CTA chest today revealing persistent pneumonia to the left upper lobe. -Will continue broaden coverage with IV vancomycin and cefepime.  Also test for Legionella and strep pneumo.  Type 2 diabetes mellitus (HCC) - Controlled.  Most recent A1c of 5 .9  Peripheral neuropathy - Continue gabapentin  Essential hypertension - Continue amlodipine  Depression - Continue Wellbutrin, Paxil, as needed trazodone  Morbid obesity (HCC) - Noted, BMI 41      Advance Care Planning: Full  Consults: none  Family Communication: none at bedside  Severity of Illness: The appropriate patient status for this patient is INPATIENT. Inpatient status is judged to be reasonable and necessary in order to provide the required intensity of  service to ensure the patient's safety. The patient's presenting symptoms, physical exam findings, and initial radiographic and laboratory data in the context of their chronic comorbidities is felt to place them at high risk for further clinical deterioration. Furthermore, it is not anticipated that the patient will be medically stable for discharge from the hospital within 2 midnights of admission.   * I certify that at the point of admission it is my clinical judgment that the patient will require inpatient hospital care spanning beyond 2 midnights from the point of admission due to high intensity of service, high risk for further deterioration and high frequency of surveillance required.*  Author: Anselm Jungling, DO 02/19/2023 1:41 AM  For on call review [email or web address].

## 2023-02-19 NOTE — Assessment & Plan Note (Addendum)
-   Continue Wellbutrin, Paxil, as needed trazodone

## 2023-02-19 NOTE — Plan of Care (Signed)
  Problem: Clinical Measurements: Goal: Ability to maintain a body temperature in the normal range will improve Outcome: Progressing   Problem: Respiratory: Goal: Ability to maintain adequate ventilation will improve Outcome: Progressing Goal: Ability to maintain a clear airway will improve Outcome: Progressing   Problem: Clinical Measurements: Goal: Ability to maintain clinical measurements within normal limits will improve Outcome: Progressing Goal: Will remain free from infection Outcome: Progressing Goal: Respiratory complications will improve Outcome: Progressing

## 2023-02-19 NOTE — Progress Notes (Signed)
[facility] Progress Note  Patient: [name]    JXB:[id number]  DOA: 02/18/2023     Date of Service: the patient was seen and examined on 02/19/2023  No chief complaint on file.  Brief hospital course: [name] is a 67 y.o. female with medical history significant of exocrine pancreatic insufficiency, recurrent C. difficile infections, hypertension, chronic diastolic heart failure, gastric bypass, DVT, type 2 diabetes, OSA, depression and morbid obesity who presents with shortness of breath.   Patient recently hospitalized from 9/28 to 10/8 with community-acquired pneumonia and completed a course of antibiotics.  Also incidentally positive for COVID at that time.  She continued to have symptoms of fatigue and shortness of breath following discharge.  She started to take her as needed Lasix twice daily and had weight loss.  She then established with a new PCP on 02/14/2023 and was started on a 5-day course of Levaquin. However continued to feel short of breath even with talking so came to ED. Has been having dry cough, nausea and had episode of vomiting earlier.    On arrival to the ED, she was afebrile and, BP of 142/76 on room air.   CBC without leukocytosis, hemoglobin 10.4 which is her baseline.   CMP was unremarkable.  Previously elevated LFTs have now normalized.   CTA chest ruled out pulmonary embolism.  There was small area of groundglass opacity in the left upper lobe new from prior and could be early infiltrate/pneumonia.   She was started on IV vancomycin and Cefepime and hospitalist consulted for admission.   Assessment and Plan: * HCAP (healthcare-associated pneumonia) -Last hospitalized from 9/28 to 10/8 with community-acquired pneumonia.  On outpatient follow-up continue to have persistent pneumonia with symptoms of shortness of breath.  She has 1 more day left of a 5-day course of Levaquin.  CTA chest today revealing persistent pneumonia to the left  upper lobe. - continue broad coverage with IV vancomycin and cefepime.  F/u Legionella and strep pneumo. Started Mucinex 6 mg p.o. twice daily, Tussionex prn for cough, Claritin 10 mg p.o. daily for 7 days and Tessalon Perles as needed DuoNeb every 6 hourly as needed, Patient was on Advair at home which has been switched to [employer] inhaler daily   Type 2 diabetes mellitus  - Controlled.  Most recent A1c of 5 .9   Peripheral neuropathy - Continue gabapentin   Essential hypertension - Continue amlodipine   Depression - Continue Wellbutrin, Paxil, as needed trazodone   Morbid obesity  Body mass index is 41.62 kg/m.  Interventions:  Diet: Heart healthy diet DVT Prophylaxis: Subcutaneous Lovenox   Advance goals of care discussion: Full code  Family Communication: family was not present at bedside, at the time of interview.  The pt provided permission to discuss medical plan with the family. Opportunity was given to ask question and all questions were answered satisfactorily.   Disposition:  Pt is from Home, admitted with HCAP, still on IV Abx, which precludes a safe discharge. Discharge to Home, when stable in 1-2 days.  Subjective: No significant events overnight, patient still having cough which is dry, feels chest congestion.  Denies any chest pain, no abdominal pain, no nausea vomiting or diarrhea.  Physical Exam: General: NAD, lying comfortably Appear in no distress, affect appropriate Eyes: PERRLA ENT: Oral Mucosa Clear, moist  Neck: no JVD,  Cardiovascular: S1 and S2 Present, no Murmur,  Respiratory: good respiratory effort, Bilateral Air entry equal and Decreased, no Crackles, no  wheezes Abdomen: Bowel Sound present, Soft and no tenderness,  Skin: no rashes Extremities: no Pedal edema, no calf tenderness Neurologic: without any new focal findings Gait not checked due to patient safety concerns  Vitals:   02/19/23 1143 02/19/23 1218 02/19/23 1324 02/19/23  1358  BP: (!) 143/64   99/64  Pulse: 65   73  Resp: 18   18  Temp: 98.7 F (37.1 C)   98.1 F (36.7 C)  TempSrc: Oral   Oral  SpO2: 96%  96% 92%  Weight:  113.5 kg    Height:  5\' 5"  (1.651 m)     No intake or output data in the 24 hours ending 02/19/23 1408 Filed Weights   02/19/23 1218  Weight: 113.5 kg    Data Reviewed: I have personally reviewed and interpreted daily labs, tele strips, imagings as discussed above. I reviewed all nursing notes, pharmacy notes, vitals, pertinent old records I have discussed plan of care as described above with RN and patient/family.  CBC: Recent Labs  Lab 02/15/23 1602 02/18/23 2115 02/19/23 0445  WBC 7.5 7.8 7.4  NEUTROABS 4.4 5.0  --   HGB 11.7 10.4* 10.5*  HCT 37.8 33.4* 34.2*  MCV 86 86.3 87.5  PLT 314 236 251   Basic Metabolic Panel: Recent Labs  Lab 02/15/23 1602 02/18/23 2115  NA 145* 142  K 4.3 3.6  CL 109* 113*  CO2 19* 22  GLUCOSE 99 97  BUN 19 20  CREATININE 1.14* 0.93  CALCIUM 8.9 8.0*    Studies: CT Angio Chest PE W and/or Wo Contrast  Result Date: 02/18/2023 CLINICAL DATA:  Pulmonary embolism (PE) suspected, high prob. Shortness of breath EXAM: CT ANGIOGRAPHY CHEST WITH CONTRAST TECHNIQUE: Multidetector CT imaging of the chest was performed using the standard protocol during bolus administration of intravenous contrast. Multiplanar CT image reconstructions and MIPs were obtained to evaluate the vascular anatomy. RADIATION DOSE REDUCTION: This exam was performed according to the departmental dose-optimization program which includes automated exposure control, adjustment of the mA and/or kV according to patient size and/or use of iterative reconstruction technique. CONTRAST:  75mL OMNIPAQUE IOHEXOL 350 MG/ML SOLN COMPARISON:  06/17/2022 FINDINGS: Cardiovascular: No filling defects in the pulmonary arteries to suggest pulmonary emboli. Cardiomegaly. Scattered coronary artery and aortic calcifications. Aorta normal  caliber. Mediastinum/Nodes: No mediastinal, hilar, or axillary adenopathy. Trachea and esophagus are unremarkable. Thyroid unremarkable. Lungs/Pleura: Scarring in the lingula. Ground-glass airspace opacity more superiorly in the left upper lobe is new since prior study. Right lung clear. No effusions. Upper Abdomen: No acute findings Musculoskeletal: Chest wall soft tissues are unremarkable. Scoliosis. No acute bony abnormality. Review of the MIP images confirms the above findings. IMPRESSION: No evidence of pulmonary embolus. Small area of ground-glass opacity in the left upper lobe is new since prior study and could reflect early infiltrate/pneumonia. Lingular scarring. Coronary artery disease. Aortic Atherosclerosis (ICD10-I70.0). Electronically Signed   By: Charlett Nose M.D.   On: 02/18/2023 23:25   DG Chest Port 1 View  Result Date: 02/18/2023 CLINICAL DATA:  Shortness of breath EXAM: PORTABLE CHEST 1 VIEW COMPARISON:  02/11/2023 FINDINGS: Cardiomegaly, vascular congestion. No confluent opacities, effusions or overt edema. No acute bony abnormality. IMPRESSION: Cardiomegaly, vascular congestion. Electronically Signed   By: Charlett Nose M.D.   On: 02/18/2023 20:14    Scheduled Meds:  amLODipine  10 mg Oral Daily   vitamin C  1,000 mg Oral Daily   aspirin EC  81 mg Oral Daily  benzonatate  100 mg Oral TID   buPROPion  300 mg Oral Daily   enoxaparin (LOVENOX) injection  40 mg Subcutaneous Q24H   ferrous sulfate  325 mg Oral Q breakfast   fluticasone furoate-vilanterol  1 puff Inhalation Daily   gabapentin  200 mg Oral BID   guaiFENesin  600 mg Oral BID   loratadine  10 mg Oral Daily   meloxicam  15 mg Oral Daily   montelukast  10 mg Oral QHS   PARoxetine  20 mg Oral Daily   traMADol  50 mg Oral BID   Continuous Infusions:  ceFEPime (MAXIPIME) IV Stopped (02/19/23 1005)   [START ON 02/20/2023] vancomycin     PRN Meds: acetaminophen, benzonatate **FOLLOWED BY** [START ON 02/21/2023]  benzonatate, chlorpheniramine-HYDROcodone, ipratropium-albuterol, lipase/protease/amylase, oxyCODONE, traZODone  Time spent: 35 minutes  Author: Gillis Santa. MD [facility] 02/19/2023 2:08 PM  To reach On-call, see care teams to locate the attending and reach out to them via [email or web address]. If 7PM-7AM, please contact night-coverage If you still have difficulty reaching the attending provider, please page the [facility] (Director on Call) for [facility] on amion for assistance.

## 2023-02-19 NOTE — Progress Notes (Addendum)
Pharmacy Antibiotic Note  [name] is a 67 y.o. female admitted on 02/18/2023 with c/o SOB, dx with COVID+ and pneumonia 10/25. Done 2 rounds of ABX. Weak, SOB, cough, using accessory muscle. Chest wall pain when coughing. Increased work of breathing. Marland Kitchen  Pharmacy has been consulted for vancomycin and cefepimedosing.  Plan: Vancomycin 2gm IV x 1 then 1250mg  q24h (AUC 468.7, Src 0.93) Cefepime 2gm IV q8h Follow renal function, cultures and clinical course    Temp (24hrs), Avg:98.1 F (36.7 C), Min:98.1 F (36.7 C), Max:98.1 F (36.7 C)  Recent Labs  Lab 02/15/23 1602 02/18/23 2115  WBC 7.5 7.8  CREATININE 1.14* 0.93    Estimated Creatinine Clearance: 71.7 mL/min (by C-G formula based on SCr of 0.93 mg/dL).    Allergies  Allergen Reactions   Carafate [Sucralfate] Hives, Itching and Other (See Comments)    Angioedema      Sulfonamide Derivatives Rash    Syncope    Antimicrobials this admission: 11/2 vanc >> 11/2 cefepime >>  Dose adjustments this admission:   Microbiology results:   Thank you for allowing pharmacy to be a part of this patient's care.  Arley Phenix RPh 02/19/2023, 12:06 AM

## 2023-02-19 NOTE — Assessment & Plan Note (Signed)
-   Noted, BMI 41

## 2023-02-19 NOTE — ED Notes (Signed)
ED TO INPATIENT HANDOFF REPORT  Name/Age/Gender [name] 67 y.o. female  Code Status    Code Status Orders  (From admission, onward)           Start     Ordered   02/19/23 0131  Full code  Continuous       Question:  By:  Answer:  Consent: discussion documented in EHR   02/19/23 0131           Code Status History     Date Active Date Inactive Code Status Order ID Comments User Context   01/15/2023 1939 01/25/2023 2246 Full Code [id number]  Therisa Doyne, MD ED   06/17/2022 2319 06/24/2022 2003 Full Code [id number]  Kinsinger, De Blanch, MD ED   12/19/2016 0931 12/21/2016 1715 Full Code [id number]  Kathrynn Running, MD ED   06/07/2014 1536 06/11/2014 1635 Full Code [id number]  Maeola Harman, MD Inpatient   05/21/2013 1928 05/22/2013 2202 Full Code [id number]  Orpah Cobb, MD Inpatient   02/20/2011 0924 02/22/2011 2050 Full Code [id number]  Mirian Mo, RN Inpatient       Home/SNF/Other Home  Chief Complaint HCAP (healthcare-associated pneumonia) [J18.9]  Level of Care/Admitting Diagnosis ED Disposition     ED Disposition  Admit   Condition  --   Comment  Hospital Area: [facility] [[id number]]  Level of Care: Med-Surg [16]  May admit patient to [facility] or [facility] if equivalent level of care is available:: No  Covid Evaluation: Asymptomatic - no recent exposure (last 10 days) testing not required  Diagnosis: HCAP (healthcare-associated pneumonia) [664403]  Admitting Physician: Anselm Jungling [[id number]]  Attending Physician: Anselm Jungling [[id number]]  Certification:: I certify this patient will need inpatient services for at least 2 midnights  Expected Medical Readiness: 02/21/2023          Medical History Past Medical History:  Diagnosis Date   Allergy 2010   Anemia    takes Ferrous Sulfate daily   Anxiety    takes Citalopram daily   Arthritis    Asthma    2004-prior to gastric bypass and no problems since   CHF  (congestive heart failure) (HCC)    takes Lasix daily as needed   Chronic back pain    spondylolisthesis/stenosis/radiculopathy   Complication of anesthesia yrs ago   slow to wake up   Depression    Diabetes (HCC)    DVT (deep venous thrombosis) (HCC) 01/17/2013   past hx. -tx.5-6 yrs ago bilateral legs, occ. sporadic swelling, has IVC filter implanted   Dysrhythmia    "heart tends to flutter"   Fibromyalgia    Fracture    right foot and is in a cam boot   Gallstones    GERD (gastroesophageal reflux disease)    hx of-no meds now   Heart murmur    History of bronchitis 2012 or 2013   History of colon polyps    Hypertension    takes Metoprolol daily   Insomnia    takes Melatonin daily   Pelvic floor dysfunction    Peripheral neuropathy    Pneumonia 90's   hx of   S/P gastric bypass 2003   Sleep apnea    no cpap used in many yrs after weight lost-no machine now   Urinary frequency    Urinary urgency    Vitamin D deficiency     Allergies Allergies  Allergen Reactions   Carafate [Sucralfate] Hives, Itching and Other (See Comments)  Angioedema      Sulfonamide Derivatives Rash    Syncope    IV Location/Drains/Wounds Patient Lines/Drains/Airways Status     Active Line/Drains/Airways     Name Placement date Placement time Site Days   Peripheral IV 02/11/23 20 G Left Antecubital 02/11/23  --  Antecubital  8   Peripheral IV 02/18/23 20 G 1.88" Right;Anterior Forearm 02/18/23  2050  Forearm  1            Labs/Imaging Results for orders placed or performed during the hospital encounter of 02/18/23 (from the past 48 hour(s))  Brain natriuretic peptide     Status: None   Collection Time: 02/18/23  9:15 PM  Result Value Ref Range   B Natriuretic Peptide 55.1 0.0 - 100.0 pg/mL    Comment: Performed at [facility], [address] [place], [place] [address]  CBC with Differential     Status: Abnormal   Collection Time: 02/18/23  9:15 PM   Result Value Ref Range   WBC 7.8 4.0 - 10.5 K/uL   RBC 3.87 3.87 - 5.11 MIL/uL   Hemoglobin 10.4 (L) 12.0 - 15.0 g/dL   HCT 27.2 (L) 53.6 - 64.4 %   MCV 86.3 80.0 - 100.0 fL   MCH 26.9 26.0 - 34.0 pg   MCHC 31.1 30.0 - 36.0 g/dL   RDW 03.4 (H) 74.2 - 59.5 %   Platelets 236 150 - 400 K/uL   nRBC 0.0 0.0 - 0.2 %   Neutrophils Relative % 64 %   Neutro Abs 5.0 1.7 - 7.7 K/uL   Lymphocytes Relative 26 %   Lymphs Abs 2.1 0.7 - 4.0 K/uL   Monocytes Relative 8 %   Monocytes Absolute 0.6 0.1 - 1.0 K/uL   Eosinophils Relative 2 %   Eosinophils Absolute 0.1 0.0 - 0.5 K/uL   Basophils Relative 0 %   Basophils Absolute 0.0 0.0 - 0.1 K/uL   Immature Granulocytes 0 %   Abs Immature Granulocytes 0.03 0.00 - 0.07 K/uL    Comment: Performed at [facility], [address], [place], [place] [address]  Comprehensive metabolic panel     Status: Abnormal   Collection Time: 02/18/23  9:15 PM  Result Value Ref Range   Sodium 142 135 - 145 mmol/L   Potassium 3.6 3.5 - 5.1 mmol/L   Chloride 113 (H) 98 - 111 mmol/L   CO2 22 22 - 32 mmol/L   Glucose, Bld 97 70 - 99 mg/dL    Comment: Glucose reference range applies only to samples taken after fasting for at least 8 hours.   BUN 20 8 - 23 mg/dL   Creatinine, Ser 6.43 0.44 - 1.00 mg/dL   Calcium 8.0 (L) 8.9 - 10.3 mg/dL   Total Protein 6.3 (L) 6.5 - 8.1 g/dL   Albumin 3.2 (L) 3.5 - 5.0 g/dL   AST 26 15 - 41 U/L   ALT 27 0 - 44 U/L   Alkaline Phosphatase 81 38 - 126 U/L   Total Bilirubin 0.7 0.3 - 1.2 mg/dL   GFR, Estimated >32 >95 mL/min    Comment: (NOTE) Calculated using the CKD-EPI Creatinine Equation (2021)    Anion gap 7 5 - 15    Comment: Performed at [facility], [address], [place], [place] [address]  Troponin I (High Sensitivity)     Status: None   Collection Time: 02/18/23  9:15 PM  Result Value Ref Range  Troponin I (High Sensitivity) 4 <18 ng/L    Comment: (NOTE) Elevated high  sensitivity troponin I (hsTnI) values and significant  changes across serial measurements may suggest ACS but many other  chronic and acute conditions are known to elevate hsTnI results.  Refer to the "Links" section for chest pain algorithms and additional  guidance. Performed at [facility], [address], [place], [place] [address]   Troponin I (High Sensitivity)     Status: None   Collection Time: 02/18/23 11:05 PM  Result Value Ref Range   Troponin I (High Sensitivity) 4 <18 ng/L    Comment: (NOTE) Elevated high sensitivity troponin I (hsTnI) values and significant  changes across serial measurements may suggest ACS but many other  chronic and acute conditions are known to elevate hsTnI results.  Refer to the "Links" section for chest pain algorithms and additional  guidance. Performed at [facility], [address] [place], [place] [address]   CBC     Status: Abnormal   Collection Time: 02/19/23  4:45 AM  Result Value Ref Range   WBC 7.4 4.0 - 10.5 K/uL   RBC 3.91 3.87 - 5.11 MIL/uL   Hemoglobin 10.5 (L) 12.0 - 15.0 g/dL   HCT 01.0 (L) 27.2 - 53.6 %   MCV 87.5 80.0 - 100.0 fL   MCH 26.9 26.0 - 34.0 pg   MCHC 30.7 30.0 - 36.0 g/dL   RDW 64.4 (H) 03.4 - 74.2 %   Platelets 251 150 - 400 K/uL   nRBC 0.0 0.0 - 0.2 %    Comment: Performed at [facility], [address] [place], [place] [address]   *Note: Due to a large number of results and/or encounters for the requested time period, some results have not been displayed. A complete set of results can be found in Results Review.   CT Angio Chest PE W and/or Wo Contrast  Result Date: 02/18/2023 CLINICAL DATA:  Pulmonary embolism (PE) suspected, high prob. Shortness of breath EXAM: CT ANGIOGRAPHY CHEST WITH CONTRAST TECHNIQUE: Multidetector CT imaging of the chest was performed using the standard protocol during bolus administration of intravenous contrast.  Multiplanar CT image reconstructions and MIPs were obtained to evaluate the vascular anatomy. RADIATION DOSE REDUCTION: This exam was performed according to the departmental dose-optimization program which includes automated exposure control, adjustment of the mA and/or kV according to patient size and/or use of iterative reconstruction technique. CONTRAST:  75mL OMNIPAQUE IOHEXOL 350 MG/ML SOLN COMPARISON:  06/17/2022 FINDINGS: Cardiovascular: No filling defects in the pulmonary arteries to suggest pulmonary emboli. Cardiomegaly. Scattered coronary artery and aortic calcifications. Aorta normal caliber. Mediastinum/Nodes: No mediastinal, hilar, or axillary adenopathy. Trachea and esophagus are unremarkable. Thyroid unremarkable. Lungs/Pleura: Scarring in the lingula. Ground-glass airspace opacity more superiorly in the left upper lobe is new since prior study. Right lung clear. No effusions. Upper Abdomen: No acute findings Musculoskeletal: Chest wall soft tissues are unremarkable. Scoliosis. No acute bony abnormality. Review of the MIP images confirms the above findings. IMPRESSION: No evidence of pulmonary embolus. Small area of ground-glass opacity in the left upper lobe is new since prior study and could reflect early infiltrate/pneumonia. Lingular scarring. Coronary artery disease. Aortic Atherosclerosis (ICD10-I70.0). Electronically Signed   By: Charlett Nose M.D.   On: 02/18/2023 23:25   DG Chest Port 1 View  Result Date: 02/18/2023 CLINICAL DATA:  Shortness of breath EXAM: PORTABLE CHEST 1 VIEW COMPARISON:  02/11/2023 FINDINGS: Cardiomegaly, vascular congestion. No confluent  opacities, effusions or overt edema. No acute bony abnormality. IMPRESSION: Cardiomegaly, vascular congestion. Electronically Signed   By: Charlett Nose M.D.   On: 02/18/2023 20:14    Pending Labs Unresulted Labs (From admission, onward)     Start     Ordered   02/20/23 0500  Creatinine, serum  Tomorrow morning,   R         02/19/23 1001   02/19/23 1002  MRSA Next Gen by PCR, Nasal  (MRSA Screening)  ONCE - URGENT,   URGENT        02/19/23 1001   02/19/23 0132  Expectorated Sputum Assessment w Gram Stain, Rflx to Resp Cult  (COPD / Pneumonia / Cellulitis / Lower Extremity Wound)  Once,   R        02/19/23 0131   02/19/23 0131  Legionella Pneumophila Serogp 1 Ur Ag  (COPD / Pneumonia / Cellulitis / Lower Extremity Wound)  Once,   R        02/19/23 0131   02/19/23 0131  Strep pneumoniae urinary antigen  (COPD / Pneumonia / Cellulitis / Lower Extremity Wound)  Once,   R        02/19/23 0131            Vitals/Pain Today's Vitals   02/19/23 0503 02/19/23 0503 02/19/23 0537 02/19/23 0800  BP:  (!) 126/99  (!) 143/81  Pulse:  66  75  Resp:  17  18  Temp: 98.5 F (36.9 C)   98.5 F (36.9 C)  TempSrc: Oral   Oral  SpO2:  97%  94%  PainSc:   Asleep 4     Isolation Precautions No active isolations  Medications Medications  enoxaparin (LOVENOX) injection 40 mg (40 mg Subcutaneous Given 02/19/23 1012)  aspirin EC tablet 81 mg (81 mg Oral Given 02/19/23 1006)  meloxicam (MOBIC) tablet 15 mg (15 mg Oral Given by Other 02/19/23 1009)  oxyCODONE (Oxy IR/ROXICODONE) immediate release tablet 5 mg (has no administration in time range)  traMADol (ULTRAM) tablet 50 mg (50 mg Oral Not Given 02/19/23 1011)  amLODipine (NORVASC) tablet 10 mg (10 mg Oral Given 02/19/23 1009)  buPROPion (WELLBUTRIN XL) 24 hr tablet 300 mg (300 mg Oral Given by Other 02/19/23 1006)  PARoxetine (PAXIL) tablet 20 mg (20 mg Oral Given by Other 02/19/23 1006)  traZODone (DESYREL) tablet 50 mg (has no administration in time range)  lipase/protease/amylase (CREON) capsule 36,000 Units (has no administration in time range)  ferrous sulfate tablet 325 mg (325 mg Oral Given 02/19/23 0757)  gabapentin (NEURONTIN) capsule 200 mg (200 mg Oral Given 02/19/23 1009)  ascorbic acid (VITAMIN C) tablet 1,000 mg (1,000 mg Oral Given 02/19/23 1009)  montelukast  (SINGULAIR) tablet 10 mg (10 mg Oral Given 02/19/23 0240)  ceFEPIme (MAXIPIME) 2 g in sodium chloride 0.9 % 100 mL IVPB (0 g Intravenous Stopped 02/19/23 1005)  vancomycin (VANCOREADY) IVPB 1250 mg/250 mL (has no administration in time range)  loratadine (CLARITIN) tablet 10 mg (has no administration in time range)  benzonatate (TESSALON) capsule 100 mg (has no administration in time range)    Followed by  benzonatate (TESSALON) capsule 100 mg (has no administration in time range)  fluticasone furoate-vilanterol (BREO ELLIPTA) 100-25 MCG/ACT 1 puff (has no administration in time range)  guaiFENesin (MUCINEX) 12 hr tablet 600 mg (has no administration in time range)  chlorpheniramine-HYDROcodone (TUSSIONEX) 10-8 MG/5ML suspension 5 mL (has no administration in time range)  chlorpheniramine-HYDROcodone (TUSSIONEX) 10-8 MG/5ML suspension 5  mL (has no administration in time range)  ipratropium-albuterol (DUONEB) 0.5-2.5 (3) MG/3ML nebulizer solution 3 mL (has no administration in time range)  iohexol (OMNIPAQUE) 350 MG/ML injection 75 mL (75 mLs Intravenous Contrast Given 02/18/23 2236)  ipratropium-albuterol (DUONEB) 0.5-2.5 (3) MG/3ML nebulizer solution 3 mL (3 mLs Nebulization Given 02/19/23 0048)  ceFEPIme (MAXIPIME) 2 g in sodium chloride 0.9 % 100 mL IVPB (0 g Intravenous Stopped 02/19/23 0444)  vancomycin (VANCOCIN) IVPB 1000 mg/200 mL premix (0 mg Intravenous Stopped 02/19/23 0444)    Followed by  vancomycin (VANCOCIN) IVPB 1000 mg/200 mL premix (0 mg Intravenous Stopped 02/19/23 0444)    Mobility walks with device

## 2023-02-19 NOTE — Assessment & Plan Note (Signed)
-  Last hospitalized from 9/28 to 10/8 with community-acquired pneumonia.  On outpatient follow-up continue to have persistent pneumonia with symptoms of shortness of breath.  She has 1 more day left of a 5-day course of Levaquin.  CTA chest today revealing persistent pneumonia to the left upper lobe. -Will continue broaden coverage with IV vancomycin and cefepime.  Also test for Legionella and strep pneumo.

## 2023-02-20 DIAGNOSIS — J189 Pneumonia, unspecified organism: Secondary | ICD-10-CM | POA: Diagnosis not present

## 2023-02-20 DIAGNOSIS — G6289 Other specified polyneuropathies: Secondary | ICD-10-CM

## 2023-02-20 DIAGNOSIS — I10 Essential (primary) hypertension: Secondary | ICD-10-CM | POA: Diagnosis not present

## 2023-02-20 DIAGNOSIS — F321 Major depressive disorder, single episode, moderate: Secondary | ICD-10-CM | POA: Diagnosis not present

## 2023-02-20 DIAGNOSIS — E119 Type 2 diabetes mellitus without complications: Secondary | ICD-10-CM

## 2023-02-20 LAB — MRSA NEXT GEN BY PCR, NASAL: MRSA by PCR Next Gen: NOT DETECTED

## 2023-02-20 LAB — CREATININE, SERUM
Creatinine, Ser: 1.28 mg/dL — ABNORMAL HIGH (ref 0.44–1.00)
GFR, Estimated: 45 mL/min — ABNORMAL LOW (ref >60)

## 2023-02-20 MED ORDER — SODIUM CHLORIDE 0.9 % IV SOLN
2.0000 g | Freq: Two times a day (BID) | INTRAVENOUS | Status: DC
  Administered 2023-02-20 – 2023-02-21 (×2): 2 g via INTRAVENOUS
  Filled 2023-02-20 (×2): qty 12.5

## 2023-02-20 MED ORDER — METHYLPREDNISOLONE SODIUM SUCC 125 MG IJ SOLR
60.0000 mg | Freq: Every day | INTRAMUSCULAR | Status: AC
  Administered 2023-02-20 – 2023-02-22 (×3): 60 mg via INTRAVENOUS
  Filled 2023-02-20 (×3): qty 2

## 2023-02-20 NOTE — Plan of Care (Signed)
  Problem: Activity: Goal: Ability to tolerate increased activity will improve Outcome: Progressing   Problem: Clinical Measurements: Goal: Ability to maintain a body temperature in the normal range will improve Outcome: Progressing   Problem: Respiratory: Goal: Ability to maintain adequate ventilation will improve Outcome: Progressing Goal: Ability to maintain a clear airway will improve Outcome: Progressing   Problem: Education: Goal: Knowledge of General Education information will improve Description: Including pain rating scale, medication(s)/side effects and non-pharmacologic comfort measures Outcome: Progressing   Problem: Health Behavior/Discharge Planning: Goal: Ability to manage health-related needs will improve Outcome: Progressing   Problem: Clinical Measurements: Goal: Ability to maintain clinical measurements within normal limits will improve Outcome: Progressing Goal: Will remain free from infection Outcome: Progressing Goal: Diagnostic test results will improve Outcome: Progressing Goal: Respiratory complications will improve Outcome: Progressing Goal: Cardiovascular complication will be avoided Outcome: Progressing   Problem: Activity: Goal: Risk for activity intolerance will decrease Outcome: Progressing   Problem: Nutrition: Goal: Adequate nutrition will be maintained Outcome: Progressing   Problem: Coping: Goal: Level of anxiety will decrease Outcome: Progressing   Problem: Elimination: Goal: Will not experience complications related to bowel motility Outcome: Progressing Goal: Will not experience complications related to urinary retention Outcome: Progressing   Problem: Pain Management: Goal: General experience of comfort will improve Outcome: Progressing   Problem: Safety: Goal: Ability to remain free from injury will improve Outcome: Progressing   Problem: Skin Integrity: Goal: Risk for impaired skin integrity will decrease Outcome:  Progressing   Problem: Activity: Goal: Ability to tolerate increased activity will improve Outcome: Progressing   Problem: Clinical Measurements: Goal: Ability to maintain a body temperature in the normal range will improve Outcome: Progressing   Problem: Respiratory: Goal: Ability to maintain adequate ventilation will improve Outcome: Progressing Goal: Ability to maintain a clear airway will improve Outcome: Progressing   Problem: Activity: Goal: Ability to tolerate increased activity will improve Outcome: Progressing   Problem: Clinical Measurements: Goal: Ability to maintain a body temperature in the normal range will improve Outcome: Progressing   Problem: Respiratory: Goal: Ability to maintain adequate ventilation will improve Outcome: Progressing Goal: Ability to maintain a clear airway will improve Outcome: Progressing
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
## 2023-02-20 NOTE — Progress Notes (Signed)
Pharmacy Antibiotic Note  [name] is a 67 y.o. female who was recently hospitalized from 01/15/23 to 01/18/23 for elevated LFTs and CAP, and dx with COVID+ and pneumonia on 10/25 . She presented back to the ED on  02/18/2023 with c/o SOB.  Chest CT on 02/18/23 was negative for PE, but had findings with concern for "early infiltrate/pneumonia." She was started on vancomycin and cefepime on admission for PNA.   Today, 02/20/2023: - day #2 abx - MRSA PCR neg - all cultures have been neg thus far - scr up 1.28 (crcl~52)  Plan: - Per Dr. Blake Divine, ok to d/c vancomycin since MRSA PCR is negative - adjust cefepime to 2gm IV q12h for renal function  ______________________________________  Height: 5\' 5"  (165.1 cm) Weight: 113.5 kg (250 lb 1.8 oz) IBW/kg (Calculated) : 57  Temp (24hrs), Avg:98.1 F (36.7 C), Min:97.6 F (36.4 C), Max:98.7 F (37.1 C)  Recent Labs  Lab 02/15/23 1602 02/18/23 2115 02/19/23 0445 02/20/23 0514  WBC 7.5 7.8 7.4  --   CREATININE 1.14* 0.93  --  1.28*    Estimated Creatinine Clearance: 52.1 mL/min (A) (by C-G formula based on SCr of 1.28 mg/dL (H)).    Allergies  Allergen Reactions   Carafate [Sucralfate] Hives, Itching and Other (See Comments)    Angioedema      Sulfonamide Derivatives Rash    Syncope     Thank you for allowing pharmacy to be a part of this patient's care.  Lucia Gaskins 02/20/2023 10:12 AM

## 2023-02-20 NOTE — Progress Notes (Signed)
Mobility Specialist - Progress Note   02/20/23 0943  Mobility  Activity Transferred from bed to chair  Level of Assistance Standby assist, set-up cues, supervision of patient - no hands on  Assistive Device Front wheel walker  Distance Ambulated (ft) 2 ft  Activity Response Tolerated well  Mobility Referral Yes  $Mobility charge 1 Mobility  Mobility Specialist Start Time (ACUTE ONLY) [id number]  Mobility Specialist Stop Time (ACUTE ONLY) 0929  Mobility Specialist Time Calculation (min) (ACUTE ONLY) 11 min   Pt received in bed and agreeable to transfer to the recliner. Pt had several coughing spells throughout session. Once standing, pt stated that her heads hurts when she is up in moving. RN made aware. No other complaints during session. Pt to recliner after session with all needs met.    [facility]

## 2023-02-20 NOTE — Progress Notes (Signed)
[facility]                                                                               [name], is a 67 y.o. female, DOB - 1955/10/29, NGE:[id number] Admit date - 02/18/2023    Outpatient Primary MD for the patient is Caudle, Shelton Silvas, FNP  LOS - 1  days    Brief summary   [name] is a 68 y.o. female with medical history significant of exocrine pancreatic insufficiency, recurrent C. difficile infections, hypertension, chronic diastolic heart failure, gastric bypass, DVT, type 2 diabetes, OSA, depression and morbid obesity who presents with shortness of breath.  Patient recently hospitalized from 9/28 to 10/8 with community-acquired pneumonia and completed a course of antibiotics. Also incidentally positive for COVID at that time. She continued to have symptoms of fatigue and shortness of breath following discharge.  CTA chest ruled out pulmonary embolism. There was small area of groundglass opacity in the left upper lobe new from prior and could be early infiltrate/pneumonia.    Assessment & Plan    Assessment and Plan: * HCAP (healthcare-associated pneumonia) -Last hospitalized from 9/28 to 10/8 with community-acquired pneumonia.  On outpatient follow-up continue to have persistent pneumonia with symptoms of shortness of breath.  CTA chest today revealing persistent pneumonia to the left upper lobe. - resume cefepime for another 3 days.  - she appears congested, and tight, wheezing heard . Will add IV solumedrol.  Continue with duonebs and home nebulizers.   Type 2 diabetes mellitus (HCC) - CBG (last 3)  No results for input(s): "GLUCAP" in the last 72 hours.  Most recent A1c of 5 .9  Peripheral neuropathy - Continue gabapentin  Essential hypertension - well controlled.  - resume home meds.   Depression - Continue Wellbutrin, Paxil, as needed trazodone  Morbid obesity (HCC) - Noted, BMI 41    Estimated body mass index is  41.62 kg/m as calculated from the following:   Height as of this encounter: 5\' 5"  (1.651 m).   Weight as of this encounter: 113.5 kg.  Code Status: full code.  DVT Prophylaxis:  enoxaparin (LOVENOX) injection 40 mg Start: 02/19/23 1000   Level of Care: Level of care: Med-Surg Family Communication: none at bedside.   Disposition Plan:     Remains inpatient appropriate:  IV antibiotics.   Procedures:  None.   Consultants:   None.   Antimicrobials:   Anti-infectives (From admission, onward)    Start     Dose/Rate Route Frequency Ordered Stop   02/20/23 2000  ceFEPIme (MAXIPIME) 2 g in sodium chloride 0.9 % 100 mL IVPB        2 g 200 mL/hr over 30 Minutes Intravenous Every 12 hours 02/20/23 1039     02/20/23 0000  vancomycin (VANCOREADY) IVPB 1250 mg/250 mL  Status:  Discontinued        1,250 mg 166.7 mL/hr over 90 Minutes Intravenous Every 24 hours 02/19/23 0308 02/20/23 1038   02/19/23 0800  ceFEPIme (MAXIPIME) 2 g in sodium chloride 0.9 % 100 mL IVPB  Status:  Discontinued        2 g 200 mL/hr over 30  Minutes Intravenous Every 8 hours 02/19/23 0146 02/20/23 1039   02/19/23 0100  vancomycin (VANCOCIN) IVPB 1000 mg/200 mL premix       Placed in "Followed by" Linked Group   1,000 mg 200 mL/hr over 60 Minutes Intravenous  Once 02/18/23 2357 02/19/23 0444   02/19/23 0000  ceFEPIme (MAXIPIME) 2 g in sodium chloride 0.9 % 100 mL IVPB        2 g 200 mL/hr over 30 Minutes Intravenous  Once 02/18/23 2351 02/19/23 0444   02/19/23 0000  vancomycin (VANCOCIN) IVPB 1000 mg/200 mL premix       Placed in "Followed by" Linked Group   1,000 mg 200 mL/hr over 60 Minutes Intravenous  Once 02/18/23 2357 02/19/23 0444        Medications  Scheduled Meds:  amLODipine  10 mg Oral Daily   vitamin C  1,000 mg Oral Daily   aspirin EC  81 mg Oral Daily   benzonatate  100 mg Oral TID   buPROPion  300 mg Oral Daily   enoxaparin (LOVENOX) injection  40 mg Subcutaneous Q24H   ferrous sulfate   325 mg Oral Q breakfast   fluticasone furoate-vilanterol  1 puff Inhalation Daily   gabapentin  200 mg Oral BID   guaiFENesin  600 mg Oral BID   loratadine  10 mg Oral Daily   meloxicam  15 mg Oral Daily   methylPREDNISolone (SOLU-MEDROL) injection  60 mg Intravenous Daily   montelukast  10 mg Oral QHS   PARoxetine  20 mg Oral Daily   traMADol  50 mg Oral BID   Continuous Infusions:  ceFEPime (MAXIPIME) IV     PRN Meds:.acetaminophen, benzonatate **FOLLOWED BY** [START ON 02/21/2023] benzonatate, chlorpheniramine-HYDROcodone, ipratropium-albuterol, lipase/protease/amylase, oxyCODONE, traZODone    Subjective:   [name] was seen and examined today.  Pt reports being congested.   Objective:   Vitals:   02/20/23 0821 02/20/23 0959 02/20/23 1013 02/20/23 1347  BP:  127/75 124/69 (!) 147/87  Pulse:   75 70  Resp:   16 18  Temp:   97.9 F (36.6 C) 97.8 F (36.6 C)  TempSrc:   Oral Oral  SpO2: 96%  96% 97%  Weight:      Height:        Intake/Output Summary (Last 24 hours) at 02/20/2023 1823 Last data filed at 02/20/2023 1700 Gross per 24 hour  Intake 1182.84 ml  Output 600 ml  Net 582.84 ml   Filed Weights   02/19/23 1218  Weight: 113.5 kg     Exam General: Alert and oriented x 3, NAD Cardiovascular: S1 S2 auscultated, no murmurs, RRR Respiratory: Clear to auscultation bilaterally, no wheezing, rales or rhonchi Gastrointestinal: Soft, nontender, nondistended, + bowel sounds Ext: no pedal edema bilaterally Neuro: AAOx3, Cr N's II- XII. Strength 5/5 upper and lower extremities bilaterally Skin: No rashes Psych: Normal affect and demeanor, alert and oriented x3    Data Reviewed:  I have personally reviewed following labs and imaging studies   CBC Lab Results  Component Value Date   WBC 7.4 02/19/2023   RBC 3.91 02/19/2023   HGB 10.5 (L) 02/19/2023   HCT 34.2 (L) 02/19/2023   MCV 87.5 02/19/2023   MCH 26.9 02/19/2023   PLT 251 02/19/2023   MCHC  30.7 02/19/2023   RDW 18.5 (H) 02/19/2023   LYMPHSABS 2.1 02/18/2023   MONOABS 0.6 02/18/2023   EOSABS 0.1 02/18/2023   BASOSABS 0.0 02/18/2023     Last metabolic  panel Lab Results  Component Value Date   NA 142 02/18/2023   K 3.6 02/18/2023   CL 113 (H) 02/18/2023   CO2 22 02/18/2023   BUN 20 02/18/2023   CREATININE 1.28 (H) 02/20/2023   GLUCOSE 97 02/18/2023   GFRNONAA 45 (L) 02/20/2023   GFRAA 95 05/01/2019   CALCIUM 8.0 (L) 02/18/2023   PHOS 2.9 01/16/2023   PROT 6.3 (L) 02/18/2023   ALBUMIN 3.2 (L) 02/18/2023   LABGLOB 2.6 02/15/2023   BILITOT 0.7 02/18/2023   ALKPHOS 81 02/18/2023   AST 26 02/18/2023   ALT 27 02/18/2023   ANIONGAP 7 02/18/2023    CBG (last 3)  No results for input(s): "GLUCAP" in the last 72 hours.    Coagulation Profile: No results for input(s): "INR", "PROTIME" in the last 168 hours.   Radiology Studies: CT Angio Chest PE W and/or Wo Contrast  Result Date: 02/18/2023 CLINICAL DATA:  Pulmonary embolism (PE) suspected, high prob. Shortness of breath EXAM: CT ANGIOGRAPHY CHEST WITH CONTRAST TECHNIQUE: Multidetector CT imaging of the chest was performed using the standard protocol during bolus administration of intravenous contrast. Multiplanar CT image reconstructions and MIPs were obtained to evaluate the vascular anatomy. RADIATION DOSE REDUCTION: This exam was performed according to the departmental dose-optimization program which includes automated exposure control, adjustment of the mA and/or kV according to patient size and/or use of iterative reconstruction technique. CONTRAST:  75mL OMNIPAQUE IOHEXOL 350 MG/ML SOLN COMPARISON:  06/17/2022 FINDINGS: Cardiovascular: No filling defects in the pulmonary arteries to suggest pulmonary emboli. Cardiomegaly. Scattered coronary artery and aortic calcifications. Aorta normal caliber. Mediastinum/Nodes: No mediastinal, hilar, or axillary adenopathy. Trachea and esophagus are unremarkable. Thyroid  unremarkable. Lungs/Pleura: Scarring in the lingula. Ground-glass airspace opacity more superiorly in the left upper lobe is new since prior study. Right lung clear. No effusions. Upper Abdomen: No acute findings Musculoskeletal: Chest wall soft tissues are unremarkable. Scoliosis. No acute bony abnormality. Review of the MIP images confirms the above findings. IMPRESSION: No evidence of pulmonary embolus. Small area of ground-glass opacity in the left upper lobe is new since prior study and could reflect early infiltrate/pneumonia. Lingular scarring. Coronary artery disease. Aortic Atherosclerosis (ICD10-I70.0). Electronically Signed   By: Charlett Nose M.D.   On: 02/18/2023 23:25   DG Chest Port 1 View  Result Date: 02/18/2023 CLINICAL DATA:  Shortness of breath EXAM: PORTABLE CHEST 1 VIEW COMPARISON:  02/11/2023 FINDINGS: Cardiomegaly, vascular congestion. No confluent opacities, effusions or overt edema. No acute bony abnormality. IMPRESSION: Cardiomegaly, vascular congestion. Electronically Signed   By: Charlett Nose M.D.   On: 02/18/2023 20:14       Kathlen Mody M.D. [facility] 02/20/2023, 6:23 PM  Available via Epic secure chat 7am-7pm After 7 pm, please refer to night coverage provider listed on amion.

## 2023-02-21 DIAGNOSIS — I10 Essential (primary) hypertension: Secondary | ICD-10-CM | POA: Diagnosis not present

## 2023-02-21 DIAGNOSIS — J189 Pneumonia, unspecified organism: Secondary | ICD-10-CM | POA: Diagnosis not present

## 2023-02-21 DIAGNOSIS — F321 Major depressive disorder, single episode, moderate: Secondary | ICD-10-CM | POA: Diagnosis not present

## 2023-02-21 LAB — BASIC METABOLIC PANEL
Anion gap: 10 (ref 5–15)
BUN: 27 mg/dL — ABNORMAL HIGH (ref 8–23)
CO2: 17 mmol/L — ABNORMAL LOW (ref 22–32)
Calcium: 9.1 mg/dL (ref 8.9–10.3)
Chloride: 111 mmol/L (ref 98–111)
Creatinine, Ser: 0.99 mg/dL (ref 0.44–1.00)
GFR, Estimated: >60 mL/min (ref >60)
Glucose, Bld: 192 mg/dL — ABNORMAL HIGH (ref 70–99)
Potassium: 4.9 mmol/L (ref 3.5–5.1)
Sodium: 138 mmol/L (ref 135–145)

## 2023-02-21 MED ORDER — ALUM & MAG HYDROXIDE-SIMETH 200-200-20 MG/5ML PO SUSP
15.0000 mL | Freq: Four times a day (QID) | ORAL | Status: DC | PRN
  Administered 2023-02-21 – 2023-02-23 (×2): 15 mL via ORAL
  Filled 2023-02-21 (×2): qty 30

## 2023-02-21 MED ORDER — IPRATROPIUM-ALBUTEROL 0.5-2.5 (3) MG/3ML IN SOLN
3.0000 mL | Freq: Two times a day (BID) | RESPIRATORY_TRACT | Status: DC
  Administered 2023-02-21 – 2023-02-24 (×5): 3 mL via RESPIRATORY_TRACT
  Filled 2023-02-21 (×6): qty 3

## 2023-02-21 MED ORDER — SODIUM CHLORIDE 0.9 % IV SOLN
2.0000 g | Freq: Three times a day (TID) | INTRAVENOUS | Status: DC
  Administered 2023-02-21 – 2023-02-24 (×9): 2 g via INTRAVENOUS
  Filled 2023-02-21 (×10): qty 12.5

## 2023-02-21 MED ORDER — PANTOPRAZOLE SODIUM 40 MG IV SOLR
40.0000 mg | INTRAVENOUS | Status: DC
  Administered 2023-02-21: 40 mg via INTRAVENOUS
  Filled 2023-02-21: qty 10

## 2023-02-21 NOTE — Evaluation (Signed)
Occupational Therapy Evaluation Patient Details Name: [name] MRN: [id number] DOB: August 07, 1955 Today's Date: 02/21/2023   History of Present Illness Pt is a 67 year old female who presented on 11/1 HCAP. Pt admitted 9/28-10/08 with CAP and COVID. PMH anemia, anxiety, OA, asthma, CHF, chronic back pain, DM, hx DVT s/p IVC filter, dysrhythmia, fibro, peripheral neuropathy, hernia repair, R knee ligament repair, lumbar fusion, exocrine pancreatic insufficiency, frequent UTI, C. difficile, essential hypertension, chronic diastolic heart failure, gastric bypass   Clinical Impression   Pt is functioning modified independently in her room/bathroom with RW. Limited by excessive coughing. Will follow to educate in energy conservation strategies. Do not anticipate pt will require post acute OT.       If plan is discharge home, recommend the following: Assist for transportation;Help with stairs or ramp for entrance    Functional Status Assessment  Patient has not had a recent decline in their functional status  Equipment Recommendations  None recommended by OT    Recommendations for Other Services       Precautions / Restrictions Precautions Precautions: Fall Restrictions Weight Bearing Restrictions: No      Mobility Bed Mobility Overal bed mobility: Modified Independent             General bed mobility comments: HOB up    Transfers Overall transfer level: Modified independent Equipment used: Rolling walker (2 wheels)                      Balance Overall balance assessment: Modified Independent                                         ADL either performed or assessed with clinical judgement   ADL Overall ADL's : Modified independent                                       General ADL Comments: pt with excessive coughing limiting tolerance for activity     Vision Ability to See in Adequate Light: 0 Adequate        Perception         Praxis         Pertinent Vitals/Pain Pain Assessment Pain Assessment: No/denies pain     Extremity/Trunk Assessment Upper Extremity Assessment Upper Extremity Assessment: Overall WFL for tasks assessed   Lower Extremity Assessment Lower Extremity Assessment: Defer to PT evaluation   Cervical / Trunk Assessment Cervical / Trunk Assessment: Normal   Communication     Cognition Arousal: Alert Behavior During Therapy: WFL for tasks assessed/performed Overall Cognitive Status: Within Functional Limits for tasks assessed                                       General Comments       Exercises     Shoulder Instructions      Home Living Family/patient expects to be discharged to:: Private residence Living Arrangements: Alone Available Help at Discharge: Family;Available PRN/intermittently Type of Home: House Home Access: Ramped entrance     Home Layout: One level     Bathroom Shower/Tub: Producer, television/film/video: Handicapped height     Home Equipment: Rollator (4 wheels);Cane -  single point;Shower seat - built in;Cane - quad          Prior Functioning/Environment Prior Level of Function : Independent/Modified Independent             Mobility Comments: using RW ADLs Comments: Modified independent        OT Problem List: Decreased activity tolerance;Impaired balance (sitting and/or standing)      OT Treatment/Interventions: Energy conservation    OT Goals(Current goals can be found in the care plan section) Acute Rehab OT Goals OT Goal Formulation: With patient Time For Goal Achievement: 03/07/23 Potential to Achieve Goals: Good  OT Frequency: Min 1X/week    Co-evaluation              AM-PAC OT "6 Clicks" Daily Activity     Outcome Measure Help from another person eating meals?: None Help from another person taking care of personal grooming?: None Help from another person toileting, which  includes using toliet, bedpan, or urinal?: None Help from another person bathing (including washing, rinsing, drying)?: None Help from another person to put on and taking off regular upper body clothing?: None Help from another person to put on and taking off regular lower body clothing?: None 6 Click Score: 24   End of Session Equipment Utilized During Treatment: Rolling walker (2 wheels)  Activity Tolerance: Treatment limited secondary to medical complications (Comment) (coughing) Patient left: in bed;with call bell/phone within reach  OT Visit Diagnosis: Other abnormalities of gait and mobility (R26.89);Other (comment) (decreased activity tolerance)                Time: 1610-9604 OT Time Calculation (min): 20 min Charges:  OT General Charges $OT Visit: 1 Visit OT Evaluation $OT Eval Low Complexity: 1 Low OT Treatments $Self Care/Home Management : 8-22 mins  Berna Spare, OTR/L Acute Rehabilitation Services Office: [phone]  Evern Bio 02/21/2023, 12:40 PM

## 2023-02-21 NOTE — Progress Notes (Signed)
[facility]                                                                               [name], is a 67 y.o. female, DOB - 11/29/1955, XBJ:[id number] Admit date - 02/18/2023    Outpatient Primary MD for the patient is Caudle, Shelton Silvas, FNP  LOS - 2  days    Brief summary   [name] is a 67 y.o. female with medical history significant of exocrine pancreatic insufficiency, recurrent C. difficile infections, hypertension, chronic diastolic heart failure, gastric bypass, DVT, type 2 diabetes, OSA, depression and morbid obesity who presents with shortness of breath.  Patient recently hospitalized from 9/28 to 10/8 with community-acquired pneumonia and completed a course of antibiotics. Also incidentally positive for COVID at that time. She continued to have symptoms of fatigue and shortness of breath following discharge.  CTA chest ruled out pulmonary embolism. There was small area of groundglass opacity in the left upper lobe new from prior and could be early infiltrate/pneumonia.    Assessment & Plan    Assessment and Plan: * HCAP (healthcare-associated pneumonia)/ vs sequela of COVID infection. -Last hospitalized from 9/28 to 10/8 with community-acquired pneumonia.  Patient continued to have sob, cough. - CTA chest showed  Small area of ground-glass opacity in the left upper lobe is new since prior study and could reflect early infiltrate/pneumonia. No pulmonary embolism. - resume cefepime , Added IV solumedrol 60 mg daily . She reports wheezy, like something stuck substernal.  - will add protonix for possible  reflux.  Continue with duonebs and home nebulizers.   Type 2 diabetes mellitus (HCC) - CBG (last 3)  No results for input(s): "GLUCAP" in the last 72 hours.  Most recent A1c of 5 .9  Peripheral neuropathy - Continue gabapentin  Essential hypertension - optimal BP parameters.  - resume home meds.   Depression - Continue  Wellbutrin, Paxil, as needed trazodone  Morbid obesity (HCC) - Noted, BMI 41   Anemia of chronic disease Hemoglobin stable around 10.5   Estimated body mass index is 41.62 kg/m as calculated from the following:   Height as of this encounter: 5\' 5"  (1.651 m).   Weight as of this encounter: 113.5 kg.  Code Status: full code.  DVT Prophylaxis:  enoxaparin (LOVENOX) injection 40 mg Start: 02/19/23 1000   Level of Care: Level of care: Med-Surg Family Communication: none at bedside.   Disposition Plan:     Remains inpatient appropriate:  IV antibiotics.   Procedures:  None.   Consultants:   None.   Antimicrobials:   Anti-infectives (From admission, onward)    Start     Dose/Rate Route Frequency Ordered Stop   02/21/23 1600  ceFEPIme (MAXIPIME) 2 g in sodium chloride 0.9 % 100 mL IVPB        2 g 200 mL/hr over 30 Minutes Intravenous Every 8 hours 02/21/23 0944     02/20/23 2000  ceFEPIme (MAXIPIME) 2 g in sodium chloride 0.9 % 100 mL IVPB  Status:  Discontinued        2 g 200 mL/hr over 30 Minutes Intravenous Every 12 hours 02/20/23 1039 02/21/23  1610   02/20/23 0000  vancomycin (VANCOREADY) IVPB 1250 mg/250 mL  Status:  Discontinued        1,250 mg 166.7 mL/hr over 90 Minutes Intravenous Every 24 hours 02/19/23 0308 02/20/23 1038   02/19/23 0800  ceFEPIme (MAXIPIME) 2 g in sodium chloride 0.9 % 100 mL IVPB  Status:  Discontinued        2 g 200 mL/hr over 30 Minutes Intravenous Every 8 hours 02/19/23 0146 02/20/23 1039   02/19/23 0100  vancomycin (VANCOCIN) IVPB 1000 mg/200 mL premix       Placed in "Followed by" Linked Group   1,000 mg 200 mL/hr over 60 Minutes Intravenous  Once 02/18/23 2357 02/19/23 0444   02/19/23 0000  ceFEPIme (MAXIPIME) 2 g in sodium chloride 0.9 % 100 mL IVPB        2 g 200 mL/hr over 30 Minutes Intravenous  Once 02/18/23 2351 02/19/23 0444   02/19/23 0000  vancomycin (VANCOCIN) IVPB 1000 mg/200 mL premix       Placed in "Followed by" Linked  Group   1,000 mg 200 mL/hr over 60 Minutes Intravenous  Once 02/18/23 2357 02/19/23 0444        Medications  Scheduled Meds:  amLODipine  10 mg Oral Daily   vitamin C  1,000 mg Oral Daily   aspirin EC  81 mg Oral Daily   buPROPion  300 mg Oral Daily   enoxaparin (LOVENOX) injection  40 mg Subcutaneous Q24H   ferrous sulfate  325 mg Oral Q breakfast   fluticasone furoate-vilanterol  1 puff Inhalation Daily   gabapentin  200 mg Oral BID   guaiFENesin  600 mg Oral BID   ipratropium-albuterol  3 mL Nebulization BID   loratadine  10 mg Oral Daily   meloxicam  15 mg Oral Daily   methylPREDNISolone (SOLU-MEDROL) injection  60 mg Intravenous Daily   montelukast  10 mg Oral QHS   pantoprazole (PROTONIX) IV  40 mg Intravenous Q24H   PARoxetine  20 mg Oral Daily   traMADol  50 mg Oral BID   Continuous Infusions:  ceFEPime (MAXIPIME) IV     PRN Meds:.acetaminophen, alum & mag hydroxide-simeth, [COMPLETED] benzonatate **FOLLOWED BY** benzonatate, chlorpheniramine-HYDROcodone, ipratropium-albuterol, lipase/protease/amylase, oxyCODONE, traZODone    Subjective:   [name] was seen and examined today.  She continues to cough, reports wheezy. No chest pain.   Objective:   Vitals:   02/20/23 1013 02/20/23 1347 02/20/23 2109 02/21/23 0612  BP: 124/69 (!) 147/87 (!) 157/96 (!) 148/89  Pulse: 75 70 86 82  Resp: 16 18 18 18   Temp: 97.9 F (36.6 C) 97.8 F (36.6 C) 98 F (36.7 C) 98.1 F (36.7 C)  TempSrc: Oral Oral Oral Oral  SpO2: 96% 97% 93% 94%  Weight:      Height:        Intake/Output Summary (Last 24 hours) at 02/21/2023 1315 Last data filed at 02/21/2023 0912 Gross per 24 hour  Intake 560 ml  Output 530 ml  Net 30 ml   Filed Weights   02/19/23 1218  Weight: 113.5 kg     Exam General exam: Appears calm and comfortable  Respiratory system: bilateral scattered wheezing posteriorly. Air entry fair.  On RA.  Cardiovascular system: S1 & S2 heard, RRR. No  JVD,  Gastrointestinal system: Abdomen is nondistended, soft and nontender.  Central nervous system: Alert and oriented. Extremities: Symmetric 5 x 5 power. Skin: No rashes, lesions or ulcers Psychiatry: Mood & affect appropriate.  Data Reviewed:  I have personally reviewed following labs and imaging studies   CBC Lab Results  Component Value Date   WBC 7.4 02/19/2023   RBC 3.91 02/19/2023   HGB 10.5 (L) 02/19/2023   HCT 34.2 (L) 02/19/2023   MCV 87.5 02/19/2023   MCH 26.9 02/19/2023   PLT 251 02/19/2023   MCHC 30.7 02/19/2023   RDW 18.5 (H) 02/19/2023   LYMPHSABS 2.1 02/18/2023   MONOABS 0.6 02/18/2023   EOSABS 0.1 02/18/2023   BASOSABS 0.0 02/18/2023     Last metabolic panel Lab Results  Component Value Date   NA 138 02/21/2023   K 4.9 02/21/2023   CL 111 02/21/2023   CO2 17 (L) 02/21/2023   BUN 27 (H) 02/21/2023   CREATININE 0.99 02/21/2023   GLUCOSE 192 (H) 02/21/2023   GFRNONAA >60 02/21/2023   GFRAA 95 05/01/2019   CALCIUM 9.1 02/21/2023   PHOS 2.9 01/16/2023   PROT 6.3 (L) 02/18/2023   ALBUMIN 3.2 (L) 02/18/2023   LABGLOB 2.6 02/15/2023   BILITOT 0.7 02/18/2023   ALKPHOS 81 02/18/2023   AST 26 02/18/2023   ALT 27 02/18/2023   ANIONGAP 10 02/21/2023    CBG (last 3)  No results for input(s): "GLUCAP" in the last 72 hours.    Coagulation Profile: No results for input(s): "INR", "PROTIME" in the last 168 hours.   Radiology Studies: No results found.     Kathlen Mody M.D. [facility] 02/21/2023, 1:15 PM  Available via Epic secure chat 7am-7pm After 7 pm, please refer to night coverage provider listed on amion.

## 2023-02-21 NOTE — Progress Notes (Signed)
Pharmacy Antibiotic Note  [name] is a 67 y.o. female who was recently hospitalized from 01/15/23 to 01/18/23 for elevated LFTs and CAP, and dx with COVID+ and pneumonia on 10/25 . She presented back to the ED on  02/18/2023 with c/o SOB.  Chest CT on 02/18/23 was negative for PE, but had findings with concern for "early infiltrate/pneumonia." She was started on vancomycin and cefepime on admission for PNA.   Today, 02/21/2023: - day #3 abx - AF - MRSA PCR neg - scr down 0.99  (crcl~67)  Plan: -  adjust cefepime to 2gm IV q8h for renal function  ______________________________________  Height: 5\' 5"  (165.1 cm) Weight: 113.5 kg (250 lb 1.8 oz) IBW/kg (Calculated) : 57  Temp (24hrs), Avg:98 F (36.7 C), Min:97.8 F (36.6 C), Max:98.1 F (36.7 C)  Recent Labs  Lab 02/15/23 1602 02/18/23 2115 02/19/23 0445 02/20/23 0514 02/21/23 0408  WBC 7.5 7.8 7.4  --   --   CREATININE 1.14* 0.93  --  1.28* 0.99    Estimated Creatinine Clearance: 67.4 mL/min (by C-G formula based on SCr of 0.99 mg/dL).    Allergies  Allergen Reactions   Carafate [Sucralfate] Hives, Itching and Other (See Comments)    Angioedema      Sulfonamide Derivatives Rash    Syncope     Thank you for allowing pharmacy to be a part of this patient's care.  Herby Abraham, Pharm.D Use secure chat for questions 02/21/2023 9:48 AM

## 2023-02-21 NOTE — Evaluation (Signed)
Physical Therapy Evaluation Patient Details Name: [name] MRN: [id number] DOB: 22-Dec-1955 Today's Date: 02/21/2023  History of Present Illness  Pt is a 67 year old female who presented on 11/1 HCAP. Pt admitted 9/28-10/08 with CAP and COVID. PMH anemia, anxiety, OA, asthma, CHF, chronic back pain, DM, hx DVT s/p IVC filter, dysrhythmia, fibro, peripheral neuropathy, hernia repair, R knee ligament repair, lumbar fusion, exocrine pancreatic insufficiency, frequent UTI, C. difficile, essential hypertension, chronic diastolic heart failure, gastric bypass  Clinical Impression  Pt admitted with above diagnosis. At baseline, pt lives alone and is independent.  She has recently been using a RW.  Today, pt limited by DOE and coughing with exertion.  She ambulated 9' with RW and supervision.  She was on RA with sats >94%.  Will benefit from acute PT to return to baseline, pt motivated - also encouraged ambulation with nursing or mobility team.  Likely no PT needs at d/c.  Pt currently with functional limitations due to the deficits listed below (see PT Problem List). Pt will benefit from acute skilled PT to increase their independence and safety with mobility to allow discharge.           If plan is discharge home, recommend the following: A little help with walking and/or transfers;A little help with bathing/dressing/bathroom;Assistance with cooking/housework   Can travel by private vehicle        Equipment Recommendations None recommended by PT  Recommendations for Other Services       Functional Status Assessment Patient has had a recent decline in their functional status and demonstrates the ability to make significant improvements in function in a reasonable and predictable amount of time.     Precautions / Restrictions Precautions Precautions: Fall Restrictions Weight Bearing Restrictions: No      Mobility  Bed Mobility Overal bed mobility: Needs Assistance Bed Mobility:  Supine to Sit     Supine to sit: HOB elevated, Supervision          Transfers Overall transfer level: Needs assistance Equipment used: Rolling walker (2 wheels) Transfers: Sit to/from Stand Sit to Stand: Supervision                Ambulation/Gait Ambulation/Gait assistance: Supervision Gait Distance (Feet): 30 Feet Assistive device: Rolling walker (2 wheels) Gait Pattern/deviations: Step-to pattern, Decreased stride length Gait velocity: decreased     General Gait Details: Fatigues easily; limited by DOE; cues for RW proximity; coughing after activity taking a few mins to recover  Stairs            Wheelchair Mobility     Tilt Bed    Modified Rankin (Stroke Patients Only)       Balance Overall balance assessment: Needs assistance Sitting-balance support: No upper extremity supported Sitting balance-Leahy Scale: Good     Standing balance support: No upper extremity supported, Bilateral upper extremity supported Standing balance-Leahy Scale: Fair Standing balance comment: RW to ambulate but able to static stand and transfer without AD                             Pertinent Vitals/Pain Pain Assessment Pain Assessment: No/denies pain    Home Living Family/patient expects to be discharged to:: Private residence Living Arrangements: Alone Available Help at Discharge: Family;Available PRN/intermittently Type of Home: House Home Access: Ramped entrance       Home Layout: One level Home Equipment: Rollator (4 wheels);Cane - single point;Shower seat - built  in;Cane - quad      Prior Function Prior Level of Function : Independent/Modified Independent             Mobility Comments: Using RW since recent admission, was using SPC prior to that; 1 fall this year (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) ADLs Comments: Modified independent     Extremity/Trunk Assessment   Upper Extremity Assessment Upper Extremity  Assessment: Overall WFL for tasks assessed    Lower Extremity Assessment Lower Extremity Assessment: Overall WFL for tasks assessed    Cervical / Trunk Assessment Cervical / Trunk Assessment: Normal  Communication      Cognition Arousal: Alert Behavior During Therapy: WFL for tasks assessed/performed Overall Cognitive Status: Within Functional Limits for tasks assessed                                 General Comments: MOtivated        General Comments General comments (skin integrity, edema, etc.): On RA with O2 sats >94%    Exercises     Assessment/Plan    PT Assessment Patient needs continued PT services  PT Problem List Decreased strength;Cardiopulmonary status limiting activity;Decreased activity tolerance;Decreased knowledge of use of DME;Decreased balance;Decreased mobility       PT Treatment Interventions DME instruction;Therapeutic exercise;Gait training;Functional mobility training;Therapeutic activities;Patient/family education;Balance training;Neuromuscular re-education;Modalities    PT Goals (Current goals can be found in the Care Plan section)  Acute Rehab PT Goals Patient Stated Goal: return home PT Goal Formulation: With patient Time For Goal Achievement: 03/07/23 Potential to Achieve Goals: Good    Frequency Min 1X/week     Co-evaluation               AM-PAC PT "6 Clicks" Mobility  Outcome Measure Help needed turning from your back to your side while in a flat bed without using bedrails?: None Help needed moving from lying on your back to sitting on the side of a flat bed without using bedrails?: None Help needed moving to and from a bed to a chair (including a wheelchair)?: A Little Help needed standing up from a chair using your arms (e.g., wheelchair or bedside chair)?: A Little Help needed to walk in hospital room?: A Little Help needed climbing 3-5 steps with a railing? : A Little 6 Click Score: 20    End of Session  Equipment Utilized During Treatment: Gait belt Activity Tolerance: Patient tolerated treatment well Patient left: in chair;with call bell/phone within reach Nurse Communication: Mobility status PT Visit Diagnosis: Other abnormalities of gait and mobility (R26.89)    Time: 0102-7253 PT Time Calculation (min) (ACUTE ONLY): 12 min   Charges:   PT Evaluation $PT Eval Low Complexity: 1 Low   PT General Charges $$ ACUTE PT VISIT: 1 Visit         Anise Salvo, PT Acute Rehab Services [facility] Rehab [phone]   Rayetta Humphrey 02/21/2023, 2:44 PM

## 2023-02-22 ENCOUNTER — Ambulatory Visit: Payer: Self-pay

## 2023-02-22 ENCOUNTER — Telehealth: Payer: Medicare HMO | Admitting: Internal Medicine

## 2023-02-22 DIAGNOSIS — F321 Major depressive disorder, single episode, moderate: Secondary | ICD-10-CM | POA: Diagnosis not present

## 2023-02-22 DIAGNOSIS — J189 Pneumonia, unspecified organism: Secondary | ICD-10-CM | POA: Diagnosis not present

## 2023-02-22 DIAGNOSIS — I10 Essential (primary) hypertension: Secondary | ICD-10-CM | POA: Diagnosis not present

## 2023-02-22 MED ORDER — PREDNISONE 20 MG PO TABS
40.0000 mg | ORAL_TABLET | Freq: Every day | ORAL | Status: DC
  Administered 2023-02-23 – 2023-02-24 (×2): 40 mg via ORAL
  Filled 2023-02-22 (×2): qty 2

## 2023-02-22 MED ORDER — MELATONIN 3 MG PO TABS
3.0000 mg | ORAL_TABLET | Freq: Every day | ORAL | Status: DC
  Administered 2023-02-22 – 2023-02-23 (×2): 3 mg via ORAL
  Filled 2023-02-22 (×2): qty 1

## 2023-02-22 MED ORDER — PANTOPRAZOLE SODIUM 40 MG PO TBEC
40.0000 mg | DELAYED_RELEASE_TABLET | Freq: Every day | ORAL | Status: DC
  Administered 2023-02-22 – 2023-02-24 (×3): 40 mg via ORAL
  Filled 2023-02-22 (×3): qty 1

## 2023-02-22 NOTE — TOC Progression Note (Signed)
Transition of Care [facility]) - Progression Note   Patient Details  Name: [name] MRN: [id number] Date of Birth: [birth year]  [facility] of [facility]) CM/SW Contact  Ewing Schlein, LCSW Phone Number: 02/22/2023, 12:23 PM  Clinical Narrative: Patient will resume HH (PT/OT/aide) through [facility] at discharge. HH orders placed by hospitalist. CSW updated Cindie with [facility].  Expected Discharge Plan: Home w Home Health Services Barriers to Discharge: Continued Medical Work up  Expected Discharge Plan and Services Discharge Planning Services: CM Consult Post Acute Care Choice: Resumption of Svcs/PTA Provider (HPT/OT/Aide w/ [facility]) Living arrangements for the past 2 months: Single Family Home HH Arranged: PT, OT, Nurse's Aide HH Agency: [facility] Date [facility] Agency Contacted: 02/22/23 Time HH Agency Contacted: 1221 Representative spoke with at [facility] Agency: Cindie  Social Determinants of Health (SDOH) Interventions SDOH Screenings   Food Insecurity: No Food Insecurity (02/20/2023)  Recent Concern: Food Insecurity - Food Insecurity Present (02/14/2023)  Housing: Low Risk  (02/20/2023)  Transportation Needs: No Transportation Needs (02/20/2023)  Recent Concern: Transportation Needs - Unmet Transportation Needs (02/19/2023)  Utilities: At Risk (02/20/2023)  Alcohol Screen: Low Risk  (02/14/2023)  Depression (PHQ2-9): Medium Risk (02/15/2023)  Financial Resource Strain: Low Risk  (02/14/2023)  Physical Activity: Inactive (02/14/2023)  Social Connections: Moderately Integrated (02/14/2023)  Stress: Stress Concern Present (02/14/2023)  Tobacco Use: Medium Risk (02/19/2023)  Health Literacy: Adequate Health Literacy (02/15/2023)   Readmission Risk Interventions    02/20/2023    3:34 PM 01/17/2023    2:53 PM  Readmission Risk Prevention Plan  Post Dischage Appt  Complete  Medication Screening  Complete  Transportation Screening Complete Complete  PCP or Specialist Appt within  5-7 Days Complete   Home Care Screening Complete   Medication Review (RN CM) Complete

## 2023-02-22 NOTE — Progress Notes (Signed)
Physical Therapy Treatment Patient Details Name: [name] MRN: [id number] DOB: [birth year] Today's Date: 02/22/2023   History of Present Illness Pt is a 67 year old female who presented on 11/1 HCAP. Pt admitted 9/28-10/08 with CAP and COVID. PMH anemia, anxiety, OA, asthma, CHF, chronic back pain, DM, hx DVT s/p IVC filter, dysrhythmia, fibro, peripheral neuropathy, hernia repair, R knee ligament repair, lumbar fusion, exocrine pancreatic insufficiency, frequent UTI, C. difficile, essential hypertension, chronic diastolic heart failure, gastric bypass    PT Comments  Pt with good improvement in DOE and activity tolerance today. She was able to increase gait to 70' with RW.  Pt did express that she had some lightheadedness and dizziness earlier today.  Initially, stating lightheaded when she first stood this morning - educated on safety with transfers, slow transitions, AROM in sitting before standing, weight shifting in standing before walking.  She then also reported that she had room spinning when she stood earlier and at times with laying for the past month.  Vestibular screen negative for central cause.  Also, screened BPPV - pt symptomatic on R first [facility] but kept closing eyes so unable to assess for nystagmus.  Tested L side, negative, retested R twice and negative.  Pt also reports ears feel clogged, may have potential vestibular hypofunction.  Does not have instability with mobility. Will continue to monitor and educated if not improving over next week or two could benefit from outpt PT.    If plan is discharge home, recommend the following: A little help with walking and/or transfers;A little help with bathing/dressing/bathroom;Assistance with cooking/housework   Can travel by private vehicle        Equipment Recommendations  None recommended by PT    Recommendations for Other Services       Precautions / Restrictions Precautions Precautions: Fall     Mobility   Bed Mobility Overal bed mobility: Needs Assistance Bed Mobility: Supine to Sit, Sit to Supine     Supine to sit: Supervision, HOB elevated Sit to supine: Supervision, +2 for safety/equipment        Transfers Overall transfer level: Needs assistance Equipment used: Rolling walker (2 wheels) Transfers: Sit to/from Stand Sit to Stand: Supervision                Ambulation/Gait Ambulation/Gait assistance: Supervision Gait Distance (Feet): 70 Feet Assistive device: Rolling walker (2 wheels) Gait Pattern/deviations: Step-to pattern, Decreased stride length Gait velocity: decreased     General Gait Details: Fatigues easily than baseline but much improved today.  Pt able to talk while walking today.  She was on RA with sats 96%   Stairs             Wheelchair Mobility     Tilt Bed    Modified Rankin (Stroke Patients Only)       Balance Overall balance assessment: Needs assistance Sitting-balance support: No upper extremity supported Sitting balance-Leahy Scale: Good     Standing balance support: No upper extremity supported, Bilateral upper extremity supported Standing balance-Leahy Scale: Fair Standing balance comment: RW to ambulate but able to static stand and transfer without AD                            Cognition Arousal: Alert Behavior During Therapy: WFL for tasks assessed/performed Overall Cognitive Status: Within Functional Limits for tasks assessed  General Comments: MOtivated        Exercises      General Comments   Vestibular assessment: History: Pt Initially, stating lightheaded when she first stood this morning.  Also, reports that she has had some room spinning sensations with standing at times and laying for the past month.  Pt with COVID dx ~1 month.  Reports ears still feel clogged.   Objective:  Pt not lightheaded with standing (after walking reports had symptoms  this morning), did not test orthostatic pressures  Spontaneous Nystagmus: Negative Gaze Induced Nystagmus: Negative Smooth Pursuit: coordinated, some dizziness Head Thrust: + bil, small correction VOR/gaze stabilization: Intact, some dizziness Test of Skew: slight horizontal correction bil, negative vertical  [address] R 1st: Symptomatic for ~20 sec but kept blinking/closing eyes, reports some nausea, returned to sitting [facility] L: negative Repeat x 2 [facility] R: negative with both retest, no symptoms, eyes open, no nystagmus  Educated pt on safety with transfers and lightheadedness (particularly in mornings if orthostatic).  Also, educated on BPPV - with R side symptomatic then no symptoms with 2 retest, question if mild BPPV that resolved with testing R and L [employer].  However, pt also reports ears clogged, recent COVID - potentially also vestibular hypofunction.  Dizziness likely multifactoral.  She was not symptomatic with mobility this afternoon.  Educated on safety, compensation techniques, and f/u outpt PT if not resolved next couple of weeks.   .      Pertinent Vitals/Pain Pain Assessment Pain Assessment: No/denies pain    Home Living                          Prior Function            PT Goals (current goals can now be found in the care plan section) Progress towards PT goals: Progressing toward goals    Frequency    Min 1X/week      PT Plan      Co-evaluation              AM-PAC PT "6 Clicks" Mobility   Outcome Measure  Help needed turning from your back to your side while in a flat bed without using bedrails?: None Help needed moving from lying on your back to sitting on the side of a flat bed without using bedrails?: None Help needed moving to and from a bed to a chair (including a wheelchair)?: A Little Help needed standing up from a chair using your arms (e.g., wheelchair or bedside chair)?: A Little Help needed to  walk in hospital room?: A Little Help needed climbing 3-5 steps with a railing? : A Little 6 Click Score: 20    End of Session Equipment Utilized During Treatment: Gait belt Activity Tolerance: Patient tolerated treatment well Patient left: with call bell/phone within reach;in bed;with bed alarm set (had been in chair all day) Nurse Communication: Mobility status PT Visit Diagnosis: Other abnormalities of gait and mobility (R26.89)     Time: 1425-1456 PT Time Calculation (min) (ACUTE ONLY): 31 min  Charges:    $Gait Training: 8-22 mins $Therapeutic Activity: 8-22 mins PT General Charges $$ ACUTE PT VISIT: 1 Visit                     Anise Salvo, PT Acute Rehab Services [facility] Rehab [phone]    Rayetta Humphrey 02/22/2023, 3:37 PM

## 2023-02-22 NOTE — Patient Outreach (Signed)
  Care Coordination   Care Coordination  Visit Note   02/22/2023 Name: [name] MRN: [id number] DOB: 28-Dec-1955  [name] is a 68 y.o. year old female who sees Caudle, Shelton Silvas, FNP for primary care.   Care Coordination: Patient transitioned to [facility] covering [place] [facility]- spoke with Danise Edge, RNCM to discussed patient status and plan of care. Patient currently in hospital.   Goals Addressed             This Visit's Progress    COMPLETED: Care coordination activities       Interventions Today    Flowsheet Row Most Recent Value  General Interventions   General Interventions Discussed/Reviewed Communication with  Communication with RN  [facility] spoke with [facility] for [facility]-discussed patient status/plan of care transition care to [facility] for [facility].]            SDOH assessments and interventions completed:  No  Care Coordination Interventions:  Yes, provided   Follow up plan:  Danise Edge, RNCM to follow up post patient discharge from hospital    Encounter Outcome:  Patient Visit Completed   Kathyrn Sheriff, RN, MSN, BSN, CCM Care Management Coordinator [phone]

## 2023-02-22 NOTE — Plan of Care (Signed)
  Problem: Activity: Goal: Ability to tolerate increased activity will improve Outcome: Progressing   Problem: Clinical Measurements: Goal: Ability to maintain a body temperature in the normal range will improve Outcome: Progressing   Problem: Respiratory: Goal: Ability to maintain adequate ventilation will improve Outcome: Progressing Goal: Ability to maintain a clear airway will improve Outcome: Progressing   

## 2023-02-22 NOTE — Progress Notes (Signed)
[facility]                                                                               [name], is a 67 y.o. female, DOB - June 25, 1955, WUJ:[id number] Admit date - 02/18/2023    Outpatient Primary MD for the patient is Caudle, Shelton Silvas, FNP  LOS - 3  days    Brief summary   [name] is a 67 y.o. female with medical history significant of exocrine pancreatic insufficiency, recurrent C. difficile infections, hypertension, chronic diastolic heart failure, gastric bypass, DVT, type 2 diabetes, OSA, depression and morbid obesity who presents with shortness of breath.  Patient recently hospitalized from 9/28 to 10/8 with community-acquired pneumonia and completed a course of antibiotics. Also incidentally positive for COVID at that time. She continued to have symptoms of fatigue and shortness of breath following discharge.  CTA chest  on admission ruled out pulmonary embolism. There was small area of groundglass opacity in the left upper lobe new from prior and could be early infiltrate/pneumonia.  She was started on IV cefepime and IV solumedrol with much improvement. Possible discharge in the next 24 hours if she continues to improve.    Assessment & Plan    Assessment and Plan: * HCAP (healthcare-associated pneumonia)/ vs sequela of COVID infection. -Last hospitalized from 9/28 to 10/8 with community-acquired pneumonia.  Patient continued to have sob, cough post discharge.  - CTA chest on admission. showed  Small area of ground-glass opacity in the left upper lobe is new since prior study and could reflect early infiltrate/pneumonia. No pulmonary embolism. - resume cefepime , completed 3 days of IV solumedrol with much improvement, transitioned to oral prednisone in am. Plan for a quick taper . She reports wheezy, like something stuck substernal,  added  protonix for possible  reflux.  Continue with duonebs and home nebulizers.   Type 2 diabetes  mellitus (HCC) - CBG (last 3)  No results for input(s): "GLUCAP" in the last 72 hours.  Most recent A1c of 5 .9  Peripheral neuropathy - Continue gabapentin  Essential hypertension Well controlled on Norvasc.   Depression - Continue Wellbutrin, Paxil, as needed trazodone  Morbid obesity (HCC) - Noted, BMI 41   Anemia of chronic disease Hemoglobin stable around 10.5   Estimated body mass index is 41.62 kg/m as calculated from the following:   Height as of this encounter: 5\' 5"  (1.651 m).   Weight as of this encounter: 113.5 kg.  Code Status: full code.  DVT Prophylaxis:  enoxaparin (LOVENOX) injection 40 mg Start: 02/19/23 1000   Level of Care: Level of care: Med-Surg Family Communication: none at bedside.   Disposition Plan:     Remains inpatient appropriate:  possible d/c home with HH in the next 24 hours.   Procedures:  None.   Consultants:   None.   Antimicrobials:   Anti-infectives (From admission, onward)    Start     Dose/Rate Route Frequency Ordered Stop   02/21/23 1600  ceFEPIme (MAXIPIME) 2 g in sodium chloride 0.9 % 100 mL IVPB        2 g 200 mL/hr over 30 Minutes Intravenous  Every 8 hours 02/21/23 0944     02/20/23 2000  ceFEPIme (MAXIPIME) 2 g in sodium chloride 0.9 % 100 mL IVPB  Status:  Discontinued        2 g 200 mL/hr over 30 Minutes Intravenous Every 12 hours 02/20/23 1039 02/21/23 0944   02/20/23 0000  vancomycin (VANCOREADY) IVPB 1250 mg/250 mL  Status:  Discontinued        1,250 mg 166.7 mL/hr over 90 Minutes Intravenous Every 24 hours 02/19/23 0308 02/20/23 1038   02/19/23 0800  ceFEPIme (MAXIPIME) 2 g in sodium chloride 0.9 % 100 mL IVPB  Status:  Discontinued        2 g 200 mL/hr over 30 Minutes Intravenous Every 8 hours 02/19/23 0146 02/20/23 1039   02/19/23 0100  vancomycin (VANCOCIN) IVPB 1000 mg/200 mL premix       Placed in "Followed by" Linked Group   1,000 mg 200 mL/hr over 60 Minutes Intravenous  Once 02/18/23 2357 02/19/23  0444   02/19/23 0000  ceFEPIme (MAXIPIME) 2 g in sodium chloride 0.9 % 100 mL IVPB        2 g 200 mL/hr over 30 Minutes Intravenous  Once 02/18/23 2351 02/19/23 0444   02/19/23 0000  vancomycin (VANCOCIN) IVPB 1000 mg/200 mL premix       Placed in "Followed by" Linked Group   1,000 mg 200 mL/hr over 60 Minutes Intravenous  Once 02/18/23 2357 02/19/23 0444        Medications  Scheduled Meds:  amLODipine  10 mg Oral Daily   vitamin C  1,000 mg Oral Daily   aspirin EC  81 mg Oral Daily   buPROPion  300 mg Oral Daily   enoxaparin (LOVENOX) injection  40 mg Subcutaneous Q24H   ferrous sulfate  325 mg Oral Q breakfast   fluticasone furoate-vilanterol  1 puff Inhalation Daily   gabapentin  200 mg Oral BID   guaiFENesin  600 mg Oral BID   ipratropium-albuterol  3 mL Nebulization BID   loratadine  10 mg Oral Daily   melatonin  3 mg Oral QHS   meloxicam  15 mg Oral Daily   montelukast  10 mg Oral QHS   pantoprazole  40 mg Oral Daily   PARoxetine  20 mg Oral Daily   traMADol  50 mg Oral BID   Continuous Infusions:  ceFEPime (MAXIPIME) IV 2 g (02/22/23 0727)   PRN Meds:.acetaminophen, alum & mag hydroxide-simeth, [COMPLETED] benzonatate **FOLLOWED BY** benzonatate, chlorpheniramine-HYDROcodone, ipratropium-albuterol, lipase/protease/amylase, oxyCODONE, traZODone    Subjective:   [name] was seen and examined today.  Cough is better. She is requesting something to sleep.  Sob has improved. No chest pain.  Flutter valve added.   Objective:   Vitals:   02/21/23 1341 02/21/23 2115 02/22/23 0506 02/22/23 1400  BP: (!) 144/80 129/78 128/75 (!) 152/78  Pulse: 80 75 87 79  Resp: 18 18 20 16   Temp: 98.1 F (36.7 C) 98.7 F (37.1 C) 98.3 F (36.8 C) 97.8 F (36.6 C)  TempSrc: Oral Oral Oral Oral  SpO2: 97% 95% 99% 91%  Weight:      Height:        Intake/Output Summary (Last 24 hours) at 02/22/2023 1516 Last data filed at 02/22/2023 1500 Gross per 24 hour  Intake  1740 ml  Output 250 ml  Net 1490 ml   Filed Weights   02/19/23 1218  Weight: 113.5 kg     Exam General exam: Appears calm and  comfortable  Respiratory system: Clear to auscultation. Respiratory effort normal. Cardiovascular system: S1 & S2 heard, RRR. No JVD,  Gastrointestinal system: Abdomen is nondistended, soft and nontender.  Central nervous system: Alert and oriented. No focal neurological deficits. Extremities: Symmetric 5 x 5 power. Skin: No rashes, lesions or ulcers Psychiatry:  Mood & affect appropriate.   Data Reviewed:  I have personally reviewed following labs and imaging studies   CBC Lab Results  Component Value Date   WBC 7.4 02/19/2023   RBC 3.91 02/19/2023   HGB 10.5 (L) 02/19/2023   HCT 34.2 (L) 02/19/2023   MCV 87.5 02/19/2023   MCH 26.9 02/19/2023   PLT 251 02/19/2023   MCHC 30.7 02/19/2023   RDW 18.5 (H) 02/19/2023   LYMPHSABS 2.1 02/18/2023   MONOABS 0.6 02/18/2023   EOSABS 0.1 02/18/2023   BASOSABS 0.0 02/18/2023     Last metabolic panel Lab Results  Component Value Date   NA 138 02/21/2023   K 4.9 02/21/2023   CL 111 02/21/2023   CO2 17 (L) 02/21/2023   BUN 27 (H) 02/21/2023   CREATININE 0.99 02/21/2023   GLUCOSE 192 (H) 02/21/2023   GFRNONAA >60 02/21/2023   GFRAA 95 05/01/2019   CALCIUM 9.1 02/21/2023   PHOS 2.9 01/16/2023   PROT 6.3 (L) 02/18/2023   ALBUMIN 3.2 (L) 02/18/2023   LABGLOB 2.6 02/15/2023   BILITOT 0.7 02/18/2023   ALKPHOS 81 02/18/2023   AST 26 02/18/2023   ALT 27 02/18/2023   ANIONGAP 10 02/21/2023    CBG (last 3)  No results for input(s): "GLUCAP" in the last 72 hours.    Coagulation Profile: No results for input(s): "INR", "PROTIME" in the last 168 hours.   Radiology Studies: No results found.     Kathlen Mody M.D. [facility] 02/22/2023, 3:16 PM  Available via Epic secure chat 7am-7pm After 7 pm, please refer to night coverage provider listed on amion.

## 2023-02-23 DIAGNOSIS — J189 Pneumonia, unspecified organism: Secondary | ICD-10-CM | POA: Diagnosis not present

## 2023-02-23 DIAGNOSIS — I10 Essential (primary) hypertension: Secondary | ICD-10-CM | POA: Diagnosis not present

## 2023-02-23 DIAGNOSIS — Y95 Nosocomial condition: Secondary | ICD-10-CM

## 2023-02-23 MED ORDER — BENZONATATE 100 MG PO CAPS
200.0000 mg | ORAL_CAPSULE | Freq: Three times a day (TID) | ORAL | Status: DC
  Administered 2023-02-23 – 2023-02-24 (×3): 200 mg via ORAL
  Filled 2023-02-23 (×3): qty 2

## 2023-02-23 MED ORDER — HYDROCOD POLI-CHLORPHE POLI ER 10-8 MG/5ML PO SUER
5.0000 mL | Freq: Two times a day (BID) | ORAL | Status: DC
  Administered 2023-02-23 – 2023-02-24 (×3): 5 mL via ORAL
  Filled 2023-02-23 (×3): qty 5

## 2023-02-23 MED ORDER — MENTHOL 3 MG MT LOZG: 1.0000 | LOZENGE | OROMUCOSAL | Status: DC | PRN

## 2023-02-23 MED ORDER — ONDANSETRON HCL 4 MG/2ML IJ SOLN
4.0000 mg | Freq: Four times a day (QID) | INTRAMUSCULAR | Status: DC | PRN
  Administered 2023-02-23: 4 mg via INTRAVENOUS
  Filled 2023-02-23 (×2): qty 2

## 2023-02-23 NOTE — Progress Notes (Signed)
Occupational Therapy Treatment Patient Details Name: Jessica BOHLIN MRN: [id number] DOB: 11/17/1955 Today's Date: 02/23/2023   History of present illness Pt is a 67 year old female who presented on 11/1 HCAP. Pt admitted 9/28-10/08 with CAP and COVID. PMH anemia, anxiety, OA, asthma, CHF, chronic back pain, DM, hx DVT s/p IVC filter, dysrhythmia, fibro, peripheral neuropathy, hernia repair, R knee ligament repair, lumbar fusion, exocrine pancreatic insufficiency, frequent UTI, C. difficile, essential hypertension, chronic diastolic heart failure, gastric bypass   OT comments  Pt seen for OT tx session. Increased bouts of coughing requires rest breaks. Pt completed ADL session with supervision for bed mobility/transfers/toileting task. Pt with increased fatigue/coughing/complaints of nausea toward end of session; OT provides close CGA for safety and IV pole mgmt. Vitals assessed: 127/60, HR 72, SpO2 98% on RA. Pt left in bed, needs within reach, RN notified. Pt advised to ask for assistance from staff for restroom needs due to increased weakness and nausea complaints this date, pt verbalizing understanding.       If plan is discharge home, recommend the following:  Assist for transportation;Help with stairs or ramp for entrance   Equipment Recommendations  None recommended by OT       Precautions / Restrictions Precautions Precautions: Fall Restrictions Weight Bearing Restrictions: No       Mobility Bed Mobility Overal bed mobility: Needs Assistance Bed Mobility: Supine to Sit, Sit to Supine     Supine to sit: Supervision, HOB elevated Sit to supine: Supervision        Transfers Overall transfer level: Needs assistance Equipment used: Rolling walker (2 wheels) Transfers: Sit to/from Stand Sit to Stand: Supervision                 Balance Overall balance assessment: Needs assistance Sitting-balance support: No upper extremity supported Sitting balance-Leahy  Scale: Good     Standing balance support: Reliant on assistive device for balance, No upper extremity supported Standing balance-Leahy Scale: Fair Standing balance comment: RW to ambulate, washes hands at sink no LOB, taking rest breaks with bilateral UE support On RW                           ADL either performed or assessed with clinical judgement   ADL Overall ADL's : Needs assistance/impaired     Grooming: Wash/dry hands;Standing                   Toilet Transfer: Regular Toilet;Supervision/safety;Rolling walker (2 wheels)   Toileting- Clothing Manipulation and Hygiene: Supervision/safety;Sit to/from stand       Functional mobility during ADLs: Supervision/safety;Contact guard assist;Rolling walker (2 wheels) General ADL Comments: excessive coughing which limits activity tolerance. pt initally supervision t/f bathroom with RW, incerased bouts of coughing with pt c/o nausea. OT provided CGA back to bed for safety/IV pole mgmt      Cognition Arousal: Alert Behavior During Therapy: WFL for tasks assessed/performed Overall Cognitive Status: Within Functional Limits for tasks assessed                                                     Pertinent Vitals/ Pain       Pain Assessment Pain Assessment: Faces Faces Pain Scale: Hurts little more Pain Location: stomach Pain Descriptors / Indicators: Cramping, Discomfort, Grimacing  Pain Intervention(s): Limited activity within patient's tolerance, Monitored during session (pt reports RN just gave meds)   Frequency  Min 1X/week        Progress Toward Goals  OT Goals(current goals can now be found in the care plan section)  Progress towards OT goals: Progressing toward goals  Acute Rehab OT Goals OT Goal Formulation: With patient Time For Goal Achievement: 03/07/23 Potential to Achieve Goals: Good  Plan         AM-PAC OT "6 Clicks" Daily Activity     Outcome Measure   Help  from another person eating meals?: None Help from another person taking care of personal grooming?: None Help from another person toileting, which includes using toliet, bedpan, or urinal?: A Little Help from another person bathing (including washing, rinsing, drying)?: A Little Help from another person to put on and taking off regular upper body clothing?: A Little Help from another person to put on and taking off regular lower body clothing?: A Little 6 Click Score: 20    End of Session Equipment Utilized During Treatment: Gait belt;Rolling walker (2 wheels)  OT Visit Diagnosis: Other abnormalities of gait and mobility (R26.89);Other (comment)   Activity Tolerance Treatment limited secondary to medical complications (Comment)   Patient Left in bed;with call bell/phone within reach;with bed alarm set   Nurse Communication Other (comment) (pts c/o nausea)        Time: 4034-7425 OT Time Calculation (min): 19 min  Charges: OT General Charges $OT Visit: 1 Visit OT Treatments $Self Care/Home Management : 8-22 mins  Graci Hulce L. Rodriguez Aguinaldo, OTR/L  02/23/23, 4:16 PM

## 2023-02-23 NOTE — Progress Notes (Signed)
[facility]                                                                              [name], is a 66 y.o. female, DOB - 05-20-1955, OVF:[id number] Admit date - 02/18/2023    Outpatient Primary MD for the patient is Caudle, Shelton Silvas, FNP  LOS - 4  days  No chief complaint on file.      Brief summary   Patient is a 67 y.o. female with exocrine pancreatic insufficiency, recurrent C. difficile infections, HTN, chronic diastolic CHF, gastric bypass, DVT, type 2 diabetes, OSA, depression and morbid obesity presented with shortness of breath.  Patient recently hospitalized from 9/28 to 10/8 with community-acquired pneumonia and completed a course of antibiotics. Also was incidentally positive for COVID at that time. She continued to have symptoms of fatigue and shortness of breath following discharge.  CTA chest  on admission ruled out pulmonary embolism. There was small area of groundglass opacity in the left upper lobe new from prior and could be early infiltrate/pneumonia.  She was started on IV cefepime and IV solumedrol with much improvement.   Assessment & Plan    Principal Problem:   HCAP (healthcare-associated pneumonia) -CTA chest on admission ruled out pulmonary embolism, small area of groundglass opacity in the left upper lobe, new from prior, could be early infiltrate/pneumonia. -During previous hospitalization 9/28-10/8 with the community-acquired pneumonia, was incidentally positive for COVID. -Continue IV cefepime, completed 3 days of IV Solu-Medrol, transition to oral prednisone -Continues to have intractable coughing, placed on scheduled Tussionex, Tessalon Perles, Cepacol -Continue Protonix for possible reflux, bronchodilators  Active problems Diabetes mellitus type 2, diet controlled, NIDDM -Hemoglobin A1c 5.9 on 10//24 - CBGs currently controlled, follow with steroids on board     Peripheral neuropathy - Continue gabapentin    Essential hypertension -BP stable, continue Norvasc   Depression - Continue Wellbutrin, Paxil, as needed trazodone   Anemia of chronic disease -H&H stable, 10.5   Morbid obesity Estimated body mass index is 41.62 kg/m as calculated from the following:   Height as of this encounter: 5\' 5"  (1.651 m).   Weight as of this encounter: 113.5 kg.  Code Status: Full code DVT Prophylaxis:  enoxaparin (LOVENOX) injection 40 mg Start: 02/19/23 1000   Level of Care: Level of care: Med-Surg Family Communication: Updated patient Disposition Plan:      Remains inpatient appropriate: Hopefully DC home in a.m.   Procedures:  None  Consultants:   None  Antimicrobials:   Anti-infectives (From admission, onward)    Start     Dose/Rate Route Frequency Ordered Stop   02/21/23 1600  ceFEPIme (MAXIPIME) 2 g in sodium chloride 0.9 % 100 mL IVPB        2 g 200 mL/hr over 30 Minutes Intravenous Every 8 hours 02/21/23 0944     02/20/23 2000  ceFEPIme (MAXIPIME) 2 g in sodium chloride 0.9 % 100 mL IVPB  Status:  Discontinued        2 g 200 mL/hr over 30 Minutes Intravenous Every 12 hours 02/20/23 1039 02/21/23 0944   02/20/23 0000  vancomycin (VANCOREADY) IVPB  1250 mg/250 mL  Status:  Discontinued        1,250 mg 166.7 mL/hr over 90 Minutes Intravenous Every 24 hours 02/19/23 0308 02/20/23 1038   02/19/23 0800  ceFEPIme (MAXIPIME) 2 g in sodium chloride 0.9 % 100 mL IVPB  Status:  Discontinued        2 g 200 mL/hr over 30 Minutes Intravenous Every 8 hours 02/19/23 0146 02/20/23 1039   02/19/23 0100  vancomycin (VANCOCIN) IVPB 1000 mg/200 mL premix       Placed in "Followed by" Linked Group   1,000 mg 200 mL/hr over 60 Minutes Intravenous  Once 02/18/23 2357 02/19/23 0444   02/19/23 0000  ceFEPIme (MAXIPIME) 2 g in sodium chloride 0.9 % 100 mL IVPB        2 g 200 mL/hr over 30 Minutes Intravenous  Once 02/18/23 2351 02/19/23 0444   02/19/23 0000  vancomycin (VANCOCIN) IVPB 1000 mg/200 mL  premix       Placed in "Followed by" Linked Group   1,000 mg 200 mL/hr over 60 Minutes Intravenous  Once 02/18/23 2357 02/19/23 0444          Medications  amLODipine  10 mg Oral Daily   vitamin C  1,000 mg Oral Daily   aspirin EC  81 mg Oral Daily   benzonatate  200 mg Oral TID   buPROPion  300 mg Oral Daily   chlorpheniramine-HYDROcodone  5 mL Oral Q12H   enoxaparin (LOVENOX) injection  40 mg Subcutaneous Q24H   ferrous sulfate  325 mg Oral Q breakfast   fluticasone furoate-vilanterol  1 puff Inhalation Daily   gabapentin  200 mg Oral BID   guaiFENesin  600 mg Oral BID   ipratropium-albuterol  3 mL Nebulization BID   loratadine  10 mg Oral Daily   melatonin  3 mg Oral QHS   meloxicam  15 mg Oral Daily   montelukast  10 mg Oral QHS   pantoprazole  40 mg Oral Daily   PARoxetine  20 mg Oral Daily   predniSONE  40 mg Oral QAC breakfast   traMADol  50 mg Oral BID      Subjective:   [name] was seen and examined today.  Having intractable coughing, mild wheezing.  No fevers or chills.  No productive phlegm.  No chest pain, nausea vomiting or abdominal pain.   Objective:   Vitals:   02/22/23 1753 02/22/23 2142 02/23/23 0601 02/23/23 0758  BP: (!) 142/96 (!) 146/85 (!) 105/59   Pulse: 76 76 62   Resp: 16 16 14    Temp: 98 F (36.7 C) (!) 97.5 F (36.4 C) 98.1 F (36.7 C)   TempSrc: Oral Oral Oral   SpO2: 98% 98% 94% 96%  Weight:      Height:        Intake/Output Summary (Last 24 hours) at 02/23/2023 1246 Last data filed at 02/23/2023 1041 Gross per 24 hour  Intake 1700 ml  Output 500 ml  Net 1200 ml     Wt Readings from Last 3 Encounters:  02/19/23 113.5 kg  02/16/23 113.4 kg  02/15/23 113.8 kg     Exam General: Alert and oriented x 3, NAD Cardiovascular: S1 S2 auscultated,  RRR Respiratory: Mild wheezing bilaterally Gastrointestinal: Soft, nontender, nondistended, + bowel sounds Ext: no pedal edema bilaterally Neuro: no new  deficits Psych: Normal affect     Data Reviewed:  I have personally reviewed following labs    CBC Lab Results  Component Value Date   WBC 7.4 02/19/2023   RBC 3.91 02/19/2023   HGB 10.5 (L) 02/19/2023   HCT 34.2 (L) 02/19/2023   MCV 87.5 02/19/2023   MCH 26.9 02/19/2023   PLT 251 02/19/2023   MCHC 30.7 02/19/2023   RDW 18.5 (H) 02/19/2023   LYMPHSABS 2.1 02/18/2023   MONOABS 0.6 02/18/2023   EOSABS 0.1 02/18/2023   BASOSABS 0.0 02/18/2023     Last metabolic panel Lab Results  Component Value Date   NA 138 02/21/2023   K 4.9 02/21/2023   CL 111 02/21/2023   CO2 17 (L) 02/21/2023   BUN 27 (H) 02/21/2023   CREATININE 0.99 02/21/2023   GLUCOSE 192 (H) 02/21/2023   GFRNONAA >60 02/21/2023   GFRAA 95 05/01/2019   CALCIUM 9.1 02/21/2023   PHOS 2.9 01/16/2023   PROT 6.3 (L) 02/18/2023   ALBUMIN 3.2 (L) 02/18/2023   LABGLOB 2.6 02/15/2023   BILITOT 0.7 02/18/2023   ALKPHOS 81 02/18/2023   AST 26 02/18/2023   ALT 27 02/18/2023   ANIONGAP 10 02/21/2023    CBG (last 3)  No results for input(s): "GLUCAP" in the last 72 hours.    Coagulation Profile: No results for input(s): "INR", "PROTIME" in the last 168 hours.   Radiology Studies: I have personally reviewed the imaging studies  No results found.     Thad Ranger M.D. [facility] 02/23/2023, 12:46 PM  Available via Epic secure chat 7am-7pm After 7 pm, please refer to night coverage provider listed on amion.

## 2023-02-23 NOTE — Progress Notes (Signed)
Mobility Specialist - Progress Note   02/23/23 1000  Mobility  Activity Ambulated with assistance in hallway  Level of Assistance Contact guard assist, steadying assist  Assistive Device Front wheel walker  Distance Ambulated (ft) 85 ft  Range of Motion/Exercises Active  Activity Response Tolerated well  Mobility Referral Yes  $Mobility charge 1 Mobility  Mobility Specialist Start Time (ACUTE ONLY) 0959  Mobility Specialist Stop Time (ACUTE ONLY) 1015  Mobility Specialist Time Calculation (min) (ACUTE ONLY) 16 min   Received in bed and agreed to mobility, pt was contact for entire session. Nearing EOS pt began to fatigue, needing recliner to rest and returned via recliner back to room where she was left with all needs met.  Marilynne Halsted Mobility Specialist

## 2023-02-24 ENCOUNTER — Encounter: Payer: Self-pay | Admitting: Licensed Clinical Social Worker

## 2023-02-24 ENCOUNTER — Ambulatory Visit: Payer: Self-pay | Admitting: Licensed Clinical Social Worker

## 2023-02-24 ENCOUNTER — Other Ambulatory Visit (HOSPITAL_COMMUNITY): Payer: Self-pay

## 2023-02-24 ENCOUNTER — Ambulatory Visit: Payer: Self-pay | Admitting: *Deleted

## 2023-02-24 DIAGNOSIS — J069 Acute upper respiratory infection, unspecified: Secondary | ICD-10-CM | POA: Diagnosis not present

## 2023-02-24 DIAGNOSIS — I10 Essential (primary) hypertension: Secondary | ICD-10-CM | POA: Diagnosis not present

## 2023-02-24 DIAGNOSIS — J189 Pneumonia, unspecified organism: Secondary | ICD-10-CM | POA: Diagnosis not present

## 2023-02-24 MED ORDER — HYDROCOD POLI-CHLORPHE POLI ER 10-8 MG/5ML PO SUER
5.0000 mL | Freq: Two times a day (BID) | ORAL | 0 refills | Status: DC | PRN
  Filled 2023-02-24: qty 115, 12d supply, fill #0

## 2023-02-24 MED ORDER — CEFPODOXIME PROXETIL 200 MG PO TABS
200.0000 mg | ORAL_TABLET | Freq: Two times a day (BID) | ORAL | 0 refills | Status: AC
  Filled 2023-02-24: qty 4, 2d supply, fill #0

## 2023-02-24 MED ORDER — ONDANSETRON HCL 4 MG PO TABS
4.0000 mg | ORAL_TABLET | Freq: Three times a day (TID) | ORAL | 0 refills | Status: DC | PRN
  Filled 2023-02-24: qty 30, 10d supply, fill #0

## 2023-02-24 MED ORDER — PANTOPRAZOLE SODIUM 40 MG PO TBEC
40.0000 mg | DELAYED_RELEASE_TABLET | Freq: Every day | ORAL | 3 refills | Status: DC
  Filled 2023-02-24: qty 30, 30d supply, fill #0

## 2023-02-24 MED ORDER — PREDNISONE 10 MG PO TABS
ORAL_TABLET | ORAL | 0 refills | Status: AC
  Filled 2023-02-24: qty 22, 10d supply, fill #0

## 2023-02-24 MED ORDER — BENZONATATE 200 MG PO CAPS
200.0000 mg | ORAL_CAPSULE | Freq: Three times a day (TID) | ORAL | 0 refills | Status: DC | PRN
  Filled 2023-02-24: qty 45, 15d supply, fill #0

## 2023-02-24 MED ORDER — TRAMADOL HCL 50 MG PO TABS: 50.0000 mg | ORAL_TABLET | Freq: Two times a day (BID) | ORAL | Status: DC

## 2023-02-24 NOTE — Patient Outreach (Signed)
  Care Management   Visit Note  02/24/2023 Name: [name] MRN: [id number] DOB: February 24, 1956  Subjective: [name] is a 67 y.o. year old female who is a primary care patient of Haywood Lasso, Shelton Silvas, FNP. The Care Management team was consulted for assistance.      Collaboration with SW and [facility] Liaison about patient pending discharge from hospital and follow up with [facility] post discharge.  Plan: The care management team will reach out to the patient again over the next 2-10 business days.   [name], BSN RN RN Care Manager  [place]  Ambulatory Care Management  Direct Number: [phone]

## 2023-02-24 NOTE — Discharge Summary (Signed)
Physician Discharge Summary   Patient: [name] MRN: [id number] DOB: January 19, 1956  Admit date:     02/18/2023  Discharge date: 02/24/23  Discharge Physician: Thad Ranger, MD    PCP: Hilbert Bible, FNP   Recommendations at discharge:   Continue Vantin 200 mg twice daily for 2 more days to complete the course Prednisone 40 mg tomorrow, then taper to 30 mg for 3 days, 20 mg for 3 days, 10 mg for 3 days then off Outpatient follow-up with pulmonology scheduled  Discharge Diagnoses:    HCAP (healthcare-associated pneumonia)   Morbid obesity (HCC)   Depression   Essential hypertension   Peripheral neuropathy   Type 2 diabetes mellitus (HCC) Morbid obesity OSA   Hospital Course:  Patient is a 67 y.o. female with exocrine pancreatic insufficiency, recurrent C. difficile infections, HTN, chronic diastolic CHF, gastric bypass, DVT, type 2 diabetes, OSA, depression and morbid obesity presented with shortness of breath.  Patient recently hospitalized from 9/28 to 10/8 with community-acquired pneumonia and completed a course of antibiotics. Also was incidentally positive for COVID at that time. She continued to have symptoms of fatigue and shortness of breath following discharge.  CTA chest  on admission ruled out pulmonary embolism. There was small area of groundglass opacity in the left upper lobe new from prior and could be early infiltrate/pneumonia.  She was started on IV cefepime and IV solumedrol with much improvement.   Assessment and Plan:    HCAP (healthcare-associated pneumonia) Recent COVID last month -CTA chest on admission ruled out pulmonary embolism, small area of groundglass opacity in the left upper lobe, new from prior, could be early infiltrate/pneumonia. -During previous hospitalization 9/28-10/8 with the community-acquired pneumonia, was positive for COVID. -Patient was placed on IV cefepime, transition to oral Vantin, completed 3 days of IV  Solu-Medrol, transition to oral prednisone with taper  -Placed on Tussionex, Tessalon Perles, Cepacol for coughing  -Continue Protonix for possible reflux, bronchodilators -Outpatient follow-up with pulmonology scheduled    Diabetes mellitus type 2, diet controlled, NIDDM -Hemoglobin A1c 5.9 on 10//24 - CBGs currently controlled, follow with steroids on board     Peripheral neuropathy - Continue gabapentin   Essential hypertension -BP stable, continue Norvasc   Depression - Continue Wellbutrin, Paxil, as needed trazodone   Anemia of chronic disease -H&H stable, 10.5   Morbid obesity Estimated body mass index is 41.62 kg/m as calculated from the following:   Height as of this encounter: 5\' 5"  (1.651 m).   Weight as of this encounter: 113.5 kg.     Pain control - [employer] Controlled Substance Reporting System database was reviewed. and patient was instructed, not to drive, operate heavy machinery, perform activities at heights, swimming or participation in water activities or provide baby-sitting services while on Pain, Sleep and Anxiety Medications; until their outpatient Physician has advised to do so again. Also recommended to not to take more than prescribed Pain, Sleep and Anxiety Medications.  Consultants: None Procedures performed: None Disposition: Home Diet recommendation:  Discharge Diet Orders (From admission, onward)     Start     Ordered   02/24/23 0000  Diet - low sodium heart healthy        02/24/23 1047            DISCHARGE MEDICATION: Allergies as of 02/24/2023       Reactions   Carafate [sucralfate] Hives, Itching, Other (See Comments)   Angioedema    Sulfonamide Derivatives Rash   Syncope  Medication List     STOP taking these medications    levofloxacin 750 MG tablet Commonly known as: Levaquin       TAKE these medications    albuterol 108 (90 Base) MCG/ACT inhaler Commonly known as: VENTOLIN HFA INHALE 2 PUFFS  INTO THE LUNGS EVERY 6 HOURS AS NEEDED FOR WHEEZING OR SHORTNESS OF BREATH   albuterol (2.5 MG/3ML) 0.083% nebulizer solution Commonly known as: PROVENTIL Take 3 mLs (2.5 mg total) by nebulization every 6 (six) hours as needed for wheezing or shortness of breath.   amLODipine 10 MG tablet Commonly known as: NORVASC Take 1 tablet (10 mg total) by mouth daily.   aspirin EC 81 MG tablet Take 1 tablet (81 mg total) by mouth daily. Swallow whole.   benzonatate 200 MG capsule Commonly known as: TESSALON Take 1 capsule (200 mg total) by mouth 3 (three) times daily as needed for cough. What changed:  medication strength how much to take   BIOFREEZE EX Apply 1 Application topically as needed (Pain).   buPROPion 300 MG 24 hr tablet Commonly known as: WELLBUTRIN XL Take 1 tablet (300 mg total) by mouth daily.   cefpodoxime 200 MG tablet Commonly known as: VANTIN Take 1 tablet (200 mg total) by mouth 2 (two) times daily for 2 days.   chlorpheniramine-HYDROcodone 10-8 MG/5ML Commonly known as: TUSSIONEX Take 5 mLs by mouth every 12 (twelve) hours as needed for cough.   dextromethorphan-guaiFENesin 30-600 MG 12hr tablet Commonly known as: MUCINEX DM Take 1 tablet by mouth 2 (two) times daily as needed for cough.   ferrous sulfate 325 (65 FE) MG tablet Take 1 tablet (325 mg total) by mouth daily with breakfast.   fluticasone-salmeterol 100-50 MCG/ACT Aepb Commonly known as: ADVAIR Inhale 1 puff into the lungs 2 (two) times daily.   gabapentin 100 MG capsule Commonly known as: NEURONTIN Take 2 capsules (200 mg total) by mouth 2 (two) times daily.   guaiFENesin 600 MG 12 hr tablet Commonly known as: MUCINEX Take 1 tablet (600 mg total) by mouth 2 (two) times daily.   ipratropium 0.03 % nasal spray Commonly known as: ATROVENT Place 2 sprays into both nostrils every 12 (twelve) hours. X 5-7 days What changed:  when to take this reasons to take this additional instructions    lipase/protease/amylase 41324 UNITS Cpep capsule Commonly known as: CREON Take 36,000 Units by mouth 4 (four) times daily as needed (Stomach Pain).   loperamide 2 MG capsule Commonly known as: IMODIUM Take 1 capsule (2 mg total) by mouth as needed for diarrhea or loose stools.   MAGNESIUM PO Take 1 tablet by mouth daily.   meloxicam 15 MG tablet Commonly known as: MOBIC Take 1 tablet (15 mg total) by mouth daily.   montelukast 10 MG tablet Commonly known as: SINGULAIR Take 1 tablet (10 mg total) by mouth at bedtime.   Multi-Vitamin tablet Take 1 tablet by mouth daily.   ondansetron 4 MG tablet Commonly known as: Zofran Take 1 tablet (4 mg total) by mouth every 8 (eight) hours as needed for nausea or vomiting.   oxyCODONE 5 MG immediate release tablet Commonly known as: Oxy IR/ROXICODONE Take 1 tablet (5 mg total) by mouth every 6 (six) hours as needed for severe pain or moderate pain.   pantoprazole 40 MG tablet Commonly known as: PROTONIX Take 1 tablet (40 mg total) by mouth daily.   PARoxetine 20 MG tablet Commonly known as: PAXIL Take 1 tablet (20 mg total) by  mouth daily.   predniSONE 10 MG tablet Commonly known as: DELTASONE Take 4 tablets (40 mg total) by mouth daily for 1 day, THEN 3 tablets (30 mg total) daily for 3 days, THEN 2 tablets (20 mg total) daily for 3 days, THEN 1 tablet (10 mg total) daily for 3 days. Start taking on: February 24, 2023   psyllium 95 % Pack Commonly known as: HYDROCIL/METAMUCIL Take 1 packet by mouth 2 (two) times daily. What changed:  when to take this reasons to take this   traMADol 50 MG tablet Commonly known as: ULTRAM Take 1 tablet (50 mg total) by mouth 2 (two) times daily.   traZODone 50 MG tablet Commonly known as: DESYREL Take 1 tablet (50 mg total) by mouth at bedtime as needed for sleep.   vitamin C 1000 MG tablet Take 1,000 mg by mouth daily.   VITAMIN D PO Take 1 capsule by mouth daily.         Follow-up Information     Care, [facility] Follow up.   Specialty: Home Health Services Why: Frances Furbish will resume PT, OT, and an aide in the home after discharge. Contact information: [address] [place] [address] [phone]         Hilbert Bible, FNP. Schedule an appointment as soon as possible for a visit in 2 week(s).   Specialty: Family Medicine Why: for hospital follow-up Contact information: [address] [place] [address] [phone]         [facility] [employer]. Call.   Contact information: [address] [place], [place] [address] [phone]        [employer] [facility]. Call.   Contact information: [address] [place], [place] [address] [phone]        [employer] [facility]. Call.   Contact information: Located in: [facility] [address] [place], [place] [address] [phone]        Charlott Holler, MD Follow up on 05/09/2023.   Specialty: Pulmonary Disease Why: at 2: 15 PM, for hospital follow-up Contact information: [address] [place] [address] [phone]                Discharge Exam: Ceasar Mons Weights   02/19/23 1218  Weight: 113.5 kg   S: States feeling somewhat better now.  No chest pain, fevers or chills.  Cough is better today.   BP (!) 160/76 (BP Location: Left Arm)   Pulse 78   Temp 98.5 F (36.9 C) (Oral)   Resp 15   Ht 5\' 5"  (1.651 m)   Wt 113.5 kg   SpO2 95%   BMI 41.62 kg/m   Physical Exam General: Alert and oriented x 3, NAD Cardiovascular: S1 S2 clear, RRR.  Respiratory: CTAB, no wheezing Gastrointestinal: Soft, nontender, nondistended, NBS Ext: no pedal edema bilaterally Neuro: no new deficits Psych: Normal affect    Condition at discharge: fair  The results of significant diagnostics from this hospitalization (including imaging, microbiology, ancillary and  laboratory) are listed below for reference.   Imaging Studies: CT Angio Chest PE W and/or Wo Contrast  Result Date: 02/18/2023 CLINICAL DATA:  Pulmonary embolism (PE) suspected, high prob. Shortness of breath EXAM: CT ANGIOGRAPHY CHEST WITH CONTRAST TECHNIQUE: Multidetector CT imaging of the chest was performed using the standard protocol during bolus administration of intravenous contrast. Multiplanar CT image reconstructions and MIPs were obtained to evaluate  the vascular anatomy. RADIATION DOSE REDUCTION: This exam was performed according to the departmental dose-optimization program which includes automated exposure control, adjustment of the mA and/or kV according to patient size and/or use of iterative reconstruction technique. CONTRAST:  75mL OMNIPAQUE IOHEXOL 350 MG/ML SOLN COMPARISON:  06/17/2022 FINDINGS: Cardiovascular: No filling defects in the pulmonary arteries to suggest pulmonary emboli. Cardiomegaly. Scattered coronary artery and aortic calcifications. Aorta normal caliber. Mediastinum/Nodes: No mediastinal, hilar, or axillary adenopathy. Trachea and esophagus are unremarkable. Thyroid unremarkable. Lungs/Pleura: Scarring in the lingula. Ground-glass airspace opacity more superiorly in the left upper lobe is new since prior study. Right lung clear. No effusions. Upper Abdomen: No acute findings Musculoskeletal: Chest wall soft tissues are unremarkable. Scoliosis. No acute bony abnormality. Review of the MIP images confirms the above findings. IMPRESSION: No evidence of pulmonary embolus. Small area of ground-glass opacity in the left upper lobe is new since prior study and could reflect early infiltrate/pneumonia. Lingular scarring. Coronary artery disease. Aortic Atherosclerosis (ICD10-I70.0). Electronically Signed   By: Charlett Nose M.D.   On: 02/18/2023 23:25   DG Chest Port 1 View  Result Date: 02/18/2023 CLINICAL DATA:  Shortness of breath EXAM: PORTABLE CHEST 1 VIEW COMPARISON:   02/11/2023 FINDINGS: Cardiomegaly, vascular congestion. No confluent opacities, effusions or overt edema. No acute bony abnormality. IMPRESSION: Cardiomegaly, vascular congestion. Electronically Signed   By: Charlett Nose M.D.   On: 02/18/2023 20:14   VAS [place] LOWER EXTREMITY VENOUS (DVT) (7a-7p)  Result Date: 02/12/2023  Lower Venous DVT Study Patient Name:  [name]  Date of Exam:   02/11/2023 Medical Rec #: [id number]            Accession #:    [id number] Date of Birth: 07-Jun-1955            Patient Gender: F Patient Age:   36 years Exam Location:  [facility] Procedure:      VAS [place] LOWER EXTREMITY VENOUS (DVT) Referring Phys: Jomarie Longs STEVENS --------------------------------------------------------------------------------  Indications: Pain, Swelling, and Edema. Other Indications: Unable to tolerate compression maneuvers due to pain. Limitations: Body habitus and Low pain tolerance. Comparison Study: Prior study on 10.4.2024. Performing Technologist: Fernande Bras  Examination Guidelines: A complete evaluation includes B-mode imaging, spectral Doppler, color Doppler, and power Doppler as needed of all accessible portions of each vessel. Bilateral testing is considered an integral part of a complete examination. Limited examinations for reoccurring indications may be performed as noted. The reflux portion of the exam is performed with the patient in reverse Trendelenburg.  +---------+---------------+---------+-----------+----------+---------------+ RIGHT    CompressibilityPhasicitySpontaneityPropertiesThrombus Aging  +---------+---------------+---------+-----------+----------+---------------+ CFV      Full           Yes      Yes                                  +---------+---------------+---------+-----------+----------+---------------+ SFJ      Full                                                          +---------+---------------+---------+-----------+----------+---------------+ FV Prox  Full                                                         +---------+---------------+---------+-----------+----------+---------------+  FV Mid   Full                                                         +---------+---------------+---------+-----------+----------+---------------+ FV Distal               Yes      Yes                  Patent by color +---------+---------------+---------+-----------+----------+---------------+ PFV      Full                                                         +---------+---------------+---------+-----------+----------+---------------+ POP      Full           Yes      Yes                                  +---------+---------------+---------+-----------+----------+---------------+ PTV      Full                                                         +---------+---------------+---------+-----------+----------+---------------+ PERO     Full                                                         +---------+---------------+---------+-----------+----------+---------------+   +----+---------------+---------+-----------+----------+--------------+ LEFTCompressibilityPhasicitySpontaneityPropertiesThrombus Aging +----+---------------+---------+-----------+----------+--------------+ CFV Full           Yes      Yes                                 +----+---------------+---------+-----------+----------+--------------+    Summary: RIGHT: - There is no evidence of deep vein thrombosis in the lower extremity.  - No cystic structure found in the popliteal fossa.  LEFT: - No evidence of common femoral vein obstruction.   *See table(s) above for measurements and observations. Electronically signed by Gerarda Fraction on 02/12/2023 at 10:42:18 AM.    Final    DG Chest 2 View  Result Date: 02/11/2023 CLINICAL DATA:  Shortness of breath and chest  tightness. Recent history of pneumonia. EXAM: CHEST - 2 VIEW COMPARISON:  Radiograph 01/15/2023 FINDINGS: Stable cardiomediastinal silhouette. Low lung volumes accentuate pulmonary vascularity. Chronic elevation right hemidiaphragm. Left perihilar airspace opacities. No definite pleural effusion. No pneumothorax. IMPRESSION: Left perihilar airspace opacities may be due to atelectasis or pneumonia. Electronically Signed   By: Minerva Fester M.D.   On: 02/11/2023 20:05    Microbiology: Results for orders placed or performed during the hospital encounter of 02/18/23  MRSA Next Gen by PCR, Nasal     Status: None   Collection Time: 02/20/23  5:00 AM   Specimen: Nasal Mucosa;  Nasal Swab  Result Value Ref Range Status   MRSA by PCR Next Gen NOT DETECTED NOT DETECTED Final    Comment: (NOTE) The GeneXpert MRSA Assay (FDA approved for NASAL specimens only), is one component of a comprehensive MRSA colonization surveillance program. It is not intended to diagnose MRSA infection nor to guide or monitor treatment for MRSA infections. Test performance is not FDA approved in patients less than 33 years old. Performed at [facility], [address], [place], [place] [address]    *Note: Due to a large number of results and/or encounters for the requested time period, some results have not been displayed. A complete set of results can be found in Results Review.    Labs: CBC: Recent Labs  Lab 02/18/23 2115 02/19/23 0445  WBC 7.8 7.4  NEUTROABS 5.0  --   HGB 10.4* 10.5*  HCT 33.4* 34.2*  MCV 86.3 87.5  PLT 236 251   Basic Metabolic Panel: Recent Labs  Lab 02/18/23 2115 02/20/23 0514 02/21/23 0408  NA 142  --  138  K 3.6  --  4.9  CL 113*  --  111  CO2 22  --  17*  GLUCOSE 97  --  192*  BUN 20  --  27*  CREATININE 0.93 1.28* 0.99  CALCIUM 8.0*  --  9.1   Liver Function Tests: Recent Labs  Lab 02/18/23 2115  AST 26  ALT 27  ALKPHOS 81  BILITOT 0.7  PROT 6.3*   ALBUMIN 3.2*   CBG: No results for input(s): "GLUCAP" in the last 168 hours.  Discharge time spent: greater than 30 minutes.  Signed: Thad Ranger, MD [facility] 02/24/2023

## 2023-02-24 NOTE — Patient Outreach (Signed)
  Care Coordination   Multidisciplinary Case Review Note    02/24/2023 Name: [name] MRN: [id number] DOB: 07-Sep-1955  [name] is a 67 y.o. year old female who sees Caudle, Shelton Silvas, FNP for primary care.  The  multidisciplinary care team met today to review patient care needs and barriers.     Goals Addressed   None     SDOH assessments and interventions completed:  No     Care Coordination Interventions Activated:  Yes  Interventions Today    Flowsheet Row Most Recent Value  Chronic Disease   Chronic disease during today's visit Other  [Patient is hospitalized]  General Interventions   Communication with RN  [SW Spoke with RN J. Wallace]        Care Coordination Interventions:  Yes, provided  Interventions Today    Flowsheet Row Most Recent Value  Chronic Disease   Chronic disease during today's visit Other  [Patient is hospitalized]  General Interventions   Communication with RN  [SW Spoke with RN J. Wallace]        Follow up plan: Follow up call scheduled for 03/11/2023 at 10:30 am    Multidisciplinary Team Attendees:   Remigio Eisenmenger, BSW, PHD Kathyrn Sheriff, [facility]  Scribe for Multidisciplinary Case Review:  [name], PhD [facility], [facility] Social Worker Direct Dial: [phone]  Fax: [phone]

## 2023-02-24 NOTE — Progress Notes (Signed)
Pharmacy Antibiotic Note  [name] is a 67 y.o. female who was recently hospitalized from 01/15/23 to 01/18/23 for elevated LFTs and CAP, and dx with COVID+ and pneumonia on 10/25 . She presented back to the ED on  02/18/2023 with c/o SOB.  Chest CT on 02/18/23 was negative for PE, but had findings with concern for "early infiltrate/pneumonia." She was started on vancomycin and cefepime on admission for PNA.   Today, 02/24/2023: - Day #6 abx - Afebrile - MRSA PCR neg  Plan: -  Continue cefepime to 2g IV q8h -  Recommend d/c after tomorrow's doses to complete 7 days  ______________________________________  Height: 5\' 5"  (165.1 cm) Weight: 113.5 kg (250 lb 1.8 oz) IBW/kg (Calculated) : 57  Temp (24hrs), Avg:98.6 F (37 C), Min:98.5 F (36.9 C), Max:98.7 F (37.1 C)  Recent Labs  Lab 02/18/23 2115 02/19/23 0445 02/20/23 0514 02/21/23 0408  WBC 7.8 7.4  --   --   CREATININE 0.93  --  1.28* 0.99    Estimated Creatinine Clearance: 67.4 mL/min (by C-G formula based on SCr of 0.99 mg/dL).    Allergies  Allergen Reactions   Carafate [Sucralfate] Hives, Itching and Other (See Comments)    Angioedema      Sulfonamide Derivatives Rash    Syncope     Thank you for allowing pharmacy to be a part of this patient's care.  Hessie Knows, PharmD, BCPS Secure Chat if ?s 02/24/2023 8:14 AM

## 2023-02-24 NOTE — Consult Note (Signed)
[facility]-Based [facility] Liaison Consult Note    02/24/2023  SUMMER MCCOLGAN 06/09/1955 [id number]  [facility] [VBCI] remote coverage review for patient admitted to [facility] with patient at bedside phone, HIPAA verified.  Primary Care Provider:  Hilbert Bible, FNP,  with [facility] Primary Care at [facility], this provider is listed to provide the community transition of care follow up and [facility] calls  Insurance: [facility]  Patient is currently active with [facility] for care coordination services.  Patient has been engaged by a [employer] and Child psychotherapist noted.  The community based plan of care has focused on disease management and community resource support. Review reveals patient asking for Food Resources and spoke with her about the potential food program with her insurance plan.    Patient will receive a post hospital call and will be evaluated for assessments and disease process education.  Will   Plan: Will update the VBCI RN and SW of post hospital needs for food resources as well via secure chat messaging.     The Community based VBCI team to continue to follow for any additional community care coordination needs for post hospital/community needs.    Of note, [facility] services does not replace or interfere with any services that are needed or arranged by inpatient [facility] care management team.   Charlesetta Shanks, RN, BSN, CCM [place]  [facility] Liaison Direct Dial: [phone] or secure chat Email: [email or web address]

## 2023-02-24 NOTE — Progress Notes (Signed)
Patient discharged home, IV removed, discharge paperwork provided and explained, patient verbalized understanding, discharge medications delivered at the bedside prior to discharge.

## 2023-02-24 NOTE — TOC Transition Note (Signed)
Transition of Care [facility]) - CM/SW Discharge Note  Patient Details  Name: [name] MRN: [id number] Date of Birth: 1956-01-15  Transition of Care [facility]) CM/SW Contact:  Ewing Schlein, LCSW Phone Number: 02/24/2023, 10:53 AM  Clinical Narrative: Patient is medically ready for discharge home. CSW notified Cindie with [facility] of discharge. CSW updated patient. Patient requested food assistance resources. CSW added food pantry information to AVS. TOC signing off.  Final next level of care: Home w Home Health Services Barriers to Discharge: Barriers Resolved  Patient Goals and CMS Choice CMS Medicare.gov Compare Post Acute Care list provided to:: Patient Choice offered to / list presented to : Patient  Discharge Plan and Services Additional resources added to the After Visit Summary for   Discharge Planning Services: CM Consult Post Acute Care Choice: Resumption of Svcs/PTA Provider (HPT/OT/Aide w/ [facility])          DME Arranged: N/A DME Agency: NA HH Arranged: PT, OT, Nurse's Aide HH Agency: [facility] Home Health Care Date [facility] Agency Contacted: 02/22/23 Time HH Agency Contacted: 1221 Representative spoke with at [facility] Agency: Cindie  Social Determinants of Health (SDOH) Interventions SDOH Screenings   Food Insecurity: No Food Insecurity (02/20/2023)  Recent Concern: Food Insecurity - Food Insecurity Present (02/14/2023)  Housing: Low Risk  (02/20/2023)  Transportation Needs: No Transportation Needs (02/20/2023)  Recent Concern: Transportation Needs - Unmet Transportation Needs (02/19/2023)  Utilities: At Risk (02/20/2023)  Alcohol Screen: Low Risk  (02/14/2023)  Depression (PHQ2-9): Medium Risk (02/15/2023)  Financial Resource Strain: Low Risk  (02/14/2023)  Physical Activity: Inactive (02/14/2023)  Social Connections: Moderately Integrated (02/14/2023)  Stress: Stress Concern Present (02/14/2023)  Tobacco Use: Medium Risk (02/19/2023)  Health Literacy: Adequate Health Literacy  (02/15/2023)   Readmission Risk Interventions    02/20/2023    3:34 PM 01/17/2023    2:53 PM  Readmission Risk Prevention Plan  Post Dischage Appt  Complete  Medication Screening  Complete  Transportation Screening Complete Complete  PCP or Specialist Appt within 5-7 Days Complete   Home Care Screening Complete   Medication Review (RN CM) Complete

## 2023-02-25 ENCOUNTER — Other Ambulatory Visit (HOSPITAL_COMMUNITY): Payer: Self-pay

## 2023-02-25 ENCOUNTER — Telehealth: Payer: Self-pay

## 2023-02-25 ENCOUNTER — Other Ambulatory Visit: Payer: Medicare HMO | Admitting: *Deleted

## 2023-02-25 NOTE — Transitions of Care (Post Inpatient/ED Visit) (Signed)
02/25/2023  Name: [name] MRN: [id number] DOB: 1955-09-06  Today's TOC FU Call Status: Today's TOC FU Call Status:: Successful TOC FU Call Completed TOC FU Call Complete Date: 02/25/23 Patient's Name and Date of Birth confirmed.  Transition Care Management Follow-up Telephone Call Date of Discharge: 02/24/23 Discharge Facility: [facility]) Type of Discharge: Inpatient Admission Primary Inpatient Discharge Diagnosis:: Healthcare Associated Pneumonia How have you been since you were released from the hospital?: Same (patient has been home less than 24 hours) Any questions or concerns?: No  Items Reviewed: Medications obtained,verified, and reconciled?: Yes (Medications Reviewed) Any new allergies since your discharge?: No Dietary orders reviewed?: Yes Type of Diet Ordered:: Low sodium, Heart Healthy Do you have support at home?: Yes People in Home: sibling(s) Name of Support/Comfort Primary Source: Patient's sister, [name] brings her meals  Medications Reviewed Today: Medications Reviewed Today     Reviewed by Jodelle Gross, RN (Case Manager) on 02/25/23 at 1135  Med List Status: <None>   Medication Order Taking? Sig Documenting Provider Last Dose Status Informant  albuterol (PROVENTIL) (2.5 MG/3ML) 0.083% nebulizer solution [id number] Yes Take 3 mLs (2.5 mg total) by nebulization every 6 (six) hours as needed for wheezing or shortness of breath. Margaretann Loveless, PA-C Taking Active Self, Pharmacy Records  albuterol (VENTOLIN HFA) 108 (90 Base) MCG/ACT inhaler [id number] Yes INHALE 2 PUFFS INTO THE LUNGS EVERY 6 HOURS AS NEEDED FOR WHEEZING OR SHORTNESS OF Elray Buba, MD Taking Active Self, Pharmacy Records  amLODipine (NORVASC) 10 MG tablet [id number] Yes Take 1 tablet (10 mg total) by mouth daily. Marinda Elk, MD Taking Active   Ascorbic Acid (VITAMIN C) 1000 MG tablet [id number] Yes Take 1,000 mg by mouth daily. [provider] Taking Active Self, Pharmacy Records  aspirin EC 81 MG tablet [id number] Yes Take 1 tablet (81 mg total) by mouth daily. Swallow whole. Pokhrel, Laxman, MD Taking Active   benzonatate (TESSALON) 200 MG capsule [id number] No Take 1 capsule (200 mg total) by mouth 3 (three) times daily as needed for cough.  Patient not taking: Reported on 02/25/2023   Cathren Harsh, MD Not Taking Active   buPROPion (WELLBUTRIN XL) 300 MG 24 hr tablet [id number] Yes Take 1 tablet (300 mg total) by mouth daily. Myrlene Broker, MD Taking Active Self, Pharmacy Records  cefpodoxime Varney Baas) 200 MG tablet [id number] Yes Take 1 tablet (200 mg total) by mouth 2 (two) times daily for 2 days. Cathren Harsh, MD Taking Active   chlorpheniramine-HYDROcodone (TUSSIONEX) 10-8 MG/5ML [id number] Yes Take 5 mLs by mouth every 12 (twelve) hours as needed for cough. Rai, Delene Ruffini, MD Taking Active   dextromethorphan-guaiFENesin [facility] DM) 30-600 MG 12hr tablet [id number] Yes Take 1 tablet by mouth 2 (two) times daily as needed for cough. [provider] Taking Active Self, Pharmacy Records  ferrous sulfate 325 (65 FE) MG tablet [id number] Yes Take 1 tablet (325 mg total) by mouth daily with breakfast. Marinda Elk, MD Taking Active   fluticasone-salmeterol (ADVAIR) 100-50 MCG/ACT AEPB [id number] Yes Inhale 1 puff into the lungs 2 (two) times daily. Hilbert Bible, FNP Taking Active   gabapentin (NEURONTIN) 100 MG capsule [id number] Yes Take 2 capsules (200 mg total) by mouth 2 (two) times daily. Meuth, Lina Sar, PA-C Taking Active Self, Pharmacy Records  guaiFENesin (MUCINEX) 600 MG 12 hr tablet [id number] Yes Take 1 tablet (600 mg total) by mouth 2 (two) times daily. Pokhrel, Rebekah Chesterfield, MD Taking Active  Med Note Colletta [place]   Mon Jan 31, 2023  1:55 PM) Reports as needed  ipratropium (ATROVENT) 0.03 % nasal spray [id number] Yes Place 2 sprays into both nostrils every 12 (twelve) hours. X 5-7 days   Patient taking differently: Place 2 sprays into both nostrils as needed for rhinitis.   Margaretann Loveless, PA-C Taking Active Self, Pharmacy Records  lipase/protease/amylase (CREON) 36000 UNITS CPEP capsule [id number] Yes Take 36,000 Units by mouth 4 (four) times daily as needed (Stomach Pain). [provider] Taking Active Self, Pharmacy Records  loperamide (IMODIUM) 2 MG capsule [id number] Yes Take 1 capsule (2 mg total) by mouth as needed for diarrhea or loose stools. Franne Forts, PA-C Taking Active Self, Pharmacy Records  MAGNESIUM PO [id number] Yes Take 1 tablet by mouth daily. [provider] Taking Active Self, Pharmacy Records  meloxicam (MOBIC) 15 MG tablet [id number] Yes Take 1 tablet (15 mg total) by mouth daily. Richardean Sale, DO Taking Active   Menthol, Topical Analgesic, [facility] EX) [id number] Yes Apply 1 Application topically as needed (Pain). [provider] Taking Active Self, Pharmacy Records  montelukast (SINGULAIR) 10 MG tablet [id number] Yes Take 1 tablet (10 mg total) by mouth at bedtime. Myrlene Broker, MD Taking Active Self, Pharmacy Records  Multiple Vitamin (MULTI-VITAMIN) tablet [id number] Yes Take 1 tablet by mouth daily. [provider] Taking Active Self, Pharmacy Records  ondansetron [facility]) 4 MG tablet [id number] Yes Take 1 tablet (4 mg total) by mouth every 8 (eight) hours as needed for nausea or vomiting. Cathren Harsh, MD Taking Active   oxyCODONE (OXY IR/ROXICODONE) 5 MG immediate release tablet [id number] Yes Take 1 tablet (5 mg total) by mouth every 6 (six) hours as needed for severe pain or moderate pain. Pokhrel, Laxman, MD Taking Active   pantoprazole (PROTONIX) 40 MG tablet [id number] Yes Take 1 tablet (40 mg total) by mouth daily. Rai, Delene Ruffini, MD Taking Active   PARoxetine (PAXIL) 20 MG tablet [id number] Yes Take 1 tablet (20 mg total) by mouth daily. Myrlene Broker, MD Taking Active Self, Pharmacy  Records  predniSONE (DELTASONE) 10 MG tablet [id number] Yes Take 4 tablets (40 mg total) by mouth daily for 1 day, THEN 3 tablets (30 mg total) daily for 3 days, THEN 2 tablets (20 mg total) daily for 3 days, THEN 1 tablet (10 mg total) daily for 3 days. Rai, Delene Ruffini, MD Taking Active   psyllium (HYDROCIL/METAMUCIL) 95 % PACK [id number] Yes Take 1 packet by mouth 2 (two) times daily.  Patient taking differently: Take 1 packet by mouth 2 (two) times daily as needed for mild constipation.   Franne Forts, PA-C Taking Active Self, Pharmacy Records  traMADol (ULTRAM) 50 MG tablet [id number] Yes Take 1 tablet (50 mg total) by mouth 2 (two) times daily. Rai, Delene Ruffini, MD Taking Active   traZODone (DESYREL) 50 MG tablet [id number] Yes Take 1 tablet (50 mg total) by mouth at bedtime as needed for sleep. Myrlene Broker, MD Taking Active Self, Pharmacy Records  VITAMIN D PO [id number] Yes Take 1 capsule by mouth daily. [provider] Taking Active Self, Pharmacy Records            Home Care and Equipment/Supplies: Were Home Health Services Ordered?: Yes Name of Home Health Agency:: [facility] Has Agency set up a time to come to your home?: No EMR reviewed for Home Health Orders:  [facility], spoke with Marylene Land who verified they are scheduled to see  patient tomorrow.) Any new equipment or medical supplies ordered?: No  Functional Questionnaire: Do you need assistance with bathing/showering or dressing?: Yes Do you need assistance with meal preparation?: No Do you need assistance with eating?: No Do you have difficulty maintaining continence: No Do you need assistance with getting out of bed/getting out of a chair/moving?: No Do you have difficulty managing or taking your medications?: No  Follow up appointments reviewed: PCP Follow-up appointment confirmed?: Yes Date of PCP follow-up appointment?: 03/15/23 [facility]  Follow-up appointment confirmed?: Yes Date of Specialist follow-up appointment?: 03/14/23 Follow-Up Specialty Provider:: Bernadene Person Do you need transportation to your follow-up appointment?: No Do you understand care options if your condition(s) worsen?: Yes-patient verbalized understanding  Patient has been scheduled with Danise Edge, RNCM on Tuesday, Nov 12@ 2:15 for follow up.  Jodelle Gross RN, BSN, CCM RN Care Manager  Transitions of Care  [facility]  [phone]

## 2023-02-26 DIAGNOSIS — J1282 Pneumonia due to coronavirus disease 2019: Secondary | ICD-10-CM | POA: Diagnosis not present

## 2023-02-26 DIAGNOSIS — I951 Orthostatic hypotension: Secondary | ICD-10-CM | POA: Diagnosis not present

## 2023-02-26 DIAGNOSIS — I5032 Chronic diastolic (congestive) heart failure: Secondary | ICD-10-CM | POA: Diagnosis not present

## 2023-02-26 DIAGNOSIS — E1142 Type 2 diabetes mellitus with diabetic polyneuropathy: Secondary | ICD-10-CM | POA: Diagnosis not present

## 2023-02-26 DIAGNOSIS — I110 Hypertensive heart disease with heart failure: Secondary | ICD-10-CM | POA: Diagnosis not present

## 2023-02-26 DIAGNOSIS — K5730 Diverticulosis of large intestine without perforation or abscess without bleeding: Secondary | ICD-10-CM | POA: Diagnosis not present

## 2023-02-26 DIAGNOSIS — D649 Anemia, unspecified: Secondary | ICD-10-CM | POA: Diagnosis not present

## 2023-02-26 DIAGNOSIS — U071 COVID-19: Secondary | ICD-10-CM | POA: Diagnosis not present

## 2023-02-26 DIAGNOSIS — G4733 Obstructive sleep apnea (adult) (pediatric): Secondary | ICD-10-CM | POA: Diagnosis not present

## 2023-03-01 ENCOUNTER — Other Ambulatory Visit (HOSPITAL_COMMUNITY): Payer: Self-pay

## 2023-03-01 ENCOUNTER — Other Ambulatory Visit: Payer: Self-pay | Admitting: *Deleted

## 2023-03-01 ENCOUNTER — Other Ambulatory Visit: Payer: Self-pay | Admitting: Family Medicine

## 2023-03-01 DIAGNOSIS — D649 Anemia, unspecified: Secondary | ICD-10-CM | POA: Diagnosis not present

## 2023-03-01 DIAGNOSIS — J189 Pneumonia, unspecified organism: Secondary | ICD-10-CM

## 2023-03-01 DIAGNOSIS — J1282 Pneumonia due to coronavirus disease 2019: Secondary | ICD-10-CM | POA: Diagnosis not present

## 2023-03-01 DIAGNOSIS — Z1231 Encounter for screening mammogram for malignant neoplasm of breast: Secondary | ICD-10-CM

## 2023-03-01 DIAGNOSIS — I10 Essential (primary) hypertension: Secondary | ICD-10-CM

## 2023-03-01 DIAGNOSIS — K5730 Diverticulosis of large intestine without perforation or abscess without bleeding: Secondary | ICD-10-CM | POA: Diagnosis not present

## 2023-03-01 DIAGNOSIS — I951 Orthostatic hypotension: Secondary | ICD-10-CM | POA: Diagnosis not present

## 2023-03-01 DIAGNOSIS — E1142 Type 2 diabetes mellitus with diabetic polyneuropathy: Secondary | ICD-10-CM | POA: Diagnosis not present

## 2023-03-01 DIAGNOSIS — I509 Heart failure, unspecified: Secondary | ICD-10-CM

## 2023-03-01 DIAGNOSIS — G4733 Obstructive sleep apnea (adult) (pediatric): Secondary | ICD-10-CM | POA: Diagnosis not present

## 2023-03-01 DIAGNOSIS — I5032 Chronic diastolic (congestive) heart failure: Secondary | ICD-10-CM | POA: Diagnosis not present

## 2023-03-01 DIAGNOSIS — I110 Hypertensive heart disease with heart failure: Secondary | ICD-10-CM | POA: Diagnosis not present

## 2023-03-01 DIAGNOSIS — U071 COVID-19: Secondary | ICD-10-CM | POA: Diagnosis not present

## 2023-03-01 NOTE — Patient Outreach (Signed)
Care Management   Visit Note  03/01/2023 Name: [name] MRN: [id number] DOB: 10-Aug-1955  Subjective: [name] is a 67 y.o. year old female who is a primary care patient of Haywood Lasso, Shelton Silvas, FNP. The Care Management team was consulted for assistance.      Engaged with patient spoke with patient by telephone.    Goals Addressed             This Visit's Progress    RNCM Care Managment Expected Outcome: Monitor, Self-Manage and Reduced Symptoms of: CHF, Hypertension, DM, Depression       Current Barriers:  Knowledge Deficits related to plan of care for management of CHF, HTN, DMII, Depression, and Pulmonary Disease  Care Coordination needs related to Limited access to food and Lack of essential utilities - [employer]* Chronic Disease Management support and education needs related to CHF, HTN, DMII, Depression, and Pulmonary Disease  Lacks caregiver support Environmental consultant barriers  RNCM Clinical Goal(s):  Patient will verbalize basic understanding of  CHF, HTN, DMII, and Depression disease process and self health management plan as evidenced by verbal explanation, recognizing symptoms and when to seek help, lifestyle modifications and daily monitoring take all medications exactly as prescribed and will call provider for medication related questions as evidenced by compliance with all medications attend all scheduled medical appointments: with primary care provider and specialist as evidenced by maintaining all scheduled appointments demonstrate Improved and Ongoing adherence to prescribed treatment plan for CHF, HTN, DMII, Depression, and Pulmonary Disease as evidenced by consistent medication compliance, symptom monitoring, continued lifestyle modifications, daily weights and continued self-management continue to work with RN Care Manager to address care management and care coordination needs related to  CHF, HTN, DMII, and Depression as  evidenced by adherence to CM Team Scheduled appointments work with community resource care guide to address needs related to  Lack of essential utilities - electricity* as evidenced by patient and/or community resource care guide support experience decrease in ED visits as evidenced by EMR review.  ED visits in in last 6 months = 3  through collaboration with RN Care manager, provider, and care team.   Interventions: Evaluation of current treatment plan related to  self management and patient's adherence to plan as established by provider   Heart Failure Interventions:  (Status:  New goal. and Goal on track:  Yes.) Long Term Goal Provided education on low sodium diet Assessed need for readable accurate scales in home. Weighs daily every morning. States her weight has been elevated since her discharge from the hospital. States her Demadex was discontinued-RNCM to send message to provider. Provided education about placing scale on hard, flat surface Advised patient to weigh each morning after emptying bladder Discussed importance of daily weight and advised patient to weigh and record daily Reviewed role of diuretics in prevention of fluid overload and management of heart failure; Discussed the importance of keeping all appointments with provider Screening for signs and symptoms of depression related to chronic disease state   Diabetes Interventions:  (Status:  New goal. and Goal on track:  Yes.) Long Term Goal Assessed patient's understanding of A1c goal: <6.5% Provided education to patient about basic DM disease process. States she only checks her blood sugar once to twice per week. Follows her recommended diet. Reviewed medications with patient and discussed importance of medication adherence Counseled on importance of regular laboratory monitoring as prescribed Discussed plans with patient for ongoing care management follow up and provided  patient with direct contact information for care  management team Lab Results  Component Value Date   HGBA1C 5.9 (H) 01/21/2023    Depression  (Status:  New goal. and Goal on track:  Yes.)  Long Term Goal Evaluation of current treatment plan related to Depression, Mental Health Concerns  self-management and patient's adherence to plan as established by provider. 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   Hypertension Interventions:  (Status:  New goal. and Goal on track:  Yes.) Long Term Goal Last practice recorded BP readings:  BP Readings from Last 3 Encounters:  02/24/23 (!) 160/76  02/16/23 128/68  02/15/23 129/67   Most recent eGFR/CrCl:  Lab Results  Component Value Date   EGFR 52 (L) 02/15/2023    No components found for: "CRCL"  Evaluation of current treatment plan related to hypertension self management and patient's adherence to plan as established by provider Provided education to patient re: stroke prevention, s/s of heart attack and stroke Reviewed medications with patient and discussed importance of compliance Counseled on adverse effects of illicit drug and excessive alcohol use in patients with high blood pressure  Discussed plans with patient for ongoing care management follow up and provided patient with direct contact information for care management team Discussed complications of poorly controlled blood pressure such as heart disease, stroke, circulatory complications, vision complications, kidney impairment, sexual dysfunction   Pneumonia  (Status:  New goal. and Goal on track:   Yes.)  Long Term Goal Evaluation of current treatment plan related to Pulmonary Disease, self-management and patient's adherence to plan as established by provider. Discussed plans with patient for ongoing care management follow up and provided patient with direct contact information for care management team Provided education to the patient about pneumonia, its causes, treatment options and prevention strategies (e.g. vaccinations) Education patient about continue use of incentive spirometer to promote lung expansion and to prevent atelectasis. Advise to complete all medications: prednisone taper Keep scheduled appointment   Patient Goals/Self-Care Activities: Take all medications as prescribed Attend all scheduled provider appointments Call pharmacy for medication refills 3-7 days in advance of running out of medications Attend church or other social activities Call provider office for new concerns or questions  call the Suicide and Crisis Lifeline: 988 call the [place] National Suicide Prevention Lifeline: 4132041123 or TTY: (812)088-0847 TTY (803)324-9439) to talk to a trained counselor call 1-800-273-TALK (toll free, 24 hour hotline) go to [facility] [address] [place] [phone]) call 911 if experiencing a Mental Health or Behavioral Health Crisis  identify and remove indoor air pollutants do breathing exercises every day begin a symptom diary eliminate symptom triggers at home write blood pressure results in a log or diary call doctor for signs and symptoms of high blood pressure  Follow Up Plan:  Telephone follow up appointment with care management team member scheduled for:  03-16-2023 at 2:00 pm              Consent to Services:  Patient was given information about care management services, agreed to services, and gave verbal consent to participate.  Plan: Telephone follow up appointment with care management team member  scheduled for: 03-16-2023 at 2:00 pm  Danise Edge, BSN RN RN Care Manager  [facility]  Ambulatory Care Management  Direct Number: [phone]

## 2023-03-01 NOTE — Patient Instructions (Signed)
Care Management   Initial Visit Note  03/01/2023 Name: [name] MRN: [id number] DOB: 29-Oct-1955  [name] is enrolled in a Managed Medicaid plan: No. Outreach attempt today was successful.   Subjective:   Objective:  Assessment: [name] is a 67 y.o. year old female who sees Caudle, Shelton Silvas, FNP for primary care. The care management team was consulted for assistance with care management and care coordination needs related to Disease Management, Educational Needs, and Care Coordination.   Review of patient status, including review of consultants reports, relevant laboratory and other test results, and collaboration with appropriate care team members and the patient's provider was performed as part of comprehensive patient evaluation and provision of care management services.    SDOH (Social Determinants of Health) screening performed today. See Care Plan Entry related to challenges with: Food Insecurity  and Depression     Goals Addressed             This Visit's Progress    RNCM Care Managment Expected Outcome: Monitor, Self-Manage and Reduced Symptoms of: CHF, Hypertension, DM, Depression       Current Barriers:  Knowledge Deficits related to plan of care for management of CHF, HTN, DMII, Depression, and Pulmonary Disease  Care Coordination needs related to Limited access to food and Lack of essential utilities - [employer]* Chronic Disease Management support and education needs related to CHF, HTN, DMII, Depression, and Pulmonary Disease  Lacks caregiver support Environmental consultant barriers  RNCM Clinical Goal(s):  Patient will verbalize basic understanding of  CHF, HTN, DMII, and Depression disease process and self health management plan as evidenced by verbal explanation, recognizing symptoms and when to seek help, lifestyle modifications and daily monitoring take all medications exactly as prescribed and will call  provider for medication related questions as evidenced by compliance with all medications attend all scheduled medical appointments: with primary care provider and specialist as evidenced by maintaining all scheduled appointments demonstrate Improved and Ongoing adherence to prescribed treatment plan for CHF, HTN, DMII, Depression, and Pulmonary Disease as evidenced by consistent medication compliance, symptom monitoring, continued lifestyle modifications, daily weights and continued self-management continue to work with RN Care Manager to address care management and care coordination needs related to  CHF, HTN, DMII, and Depression as evidenced by adherence to CM Team Scheduled appointments work with community resource care guide to address needs related to  Lack of essential utilities - electricity* as evidenced by patient and/or community resource care guide support experience decrease in ED visits as evidenced by EMR review.  ED visits in in last 6 months = 3  through collaboration with RN Care manager, provider, and care team.   Interventions: Evaluation of current treatment plan related to  self management and patient's adherence to plan as established by provider   Heart Failure Interventions:  (Status:  New goal. and Goal on track:  Yes.) Long Term Goal Provided education on low sodium diet Assessed need for readable accurate scales in home. Weighs daily every morning. States her weight has been elevated since her discharge from the hospital. States her Demadex was discontinued-RNCM to send message to provider. Provided education about placing scale on hard, flat surface Advised patient to weigh each morning after emptying bladder Discussed importance of daily weight and advised patient to weigh and record daily Reviewed role of diuretics in prevention of fluid overload and management of heart failure; Discussed the importance of keeping all appointments with provider Screening  for signs  and symptoms of depression related to chronic disease state   Diabetes Interventions:  (Status:  New goal. and Goal on track:  Yes.) Long Term Goal Assessed patient's understanding of A1c goal: <6.5% Provided education to patient about basic DM disease process. States she only checks her blood sugar once to twice per week. Follows her recommended diet. Reviewed medications with patient and discussed importance of medication adherence Counseled on importance of regular laboratory monitoring as prescribed Discussed plans with patient for ongoing care management follow up and provided patient with direct contact information for care management team Lab Results  Component Value Date   HGBA1C 5.9 (H) 01/21/2023    Depression  (Status:  New goal. and Goal on track:  Yes.)  Long Term Goal Evaluation of current treatment plan related to Depression, Mental Health Concerns  self-management and patient's adherence to plan as established by provider. 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   Hypertension Interventions:  (Status:  New goal. and Goal on track:  Yes.) Long Term Goal Last practice recorded BP readings:  BP Readings from Last 3 Encounters:  02/24/23 (!) 160/76  02/16/23 128/68  02/15/23 129/67   Most recent eGFR/CrCl:  Lab Results  Component Value Date   EGFR 52 (L) 02/15/2023    No components found for: "CRCL"  Evaluation of current treatment plan related to hypertension self management and patient's adherence to plan as established by  provider Provided education to patient re: stroke prevention, s/s of heart attack and stroke Reviewed medications with patient and discussed importance of compliance Counseled on adverse effects of illicit drug and excessive alcohol use in patients with high blood pressure  Discussed plans with patient for ongoing care management follow up and provided patient with direct contact information for care management team Discussed complications of poorly controlled blood pressure such as heart disease, stroke, circulatory complications, vision complications, kidney impairment, sexual dysfunction   Pneumonia  (Status:  New goal. and Goal on track:  Yes.)  Long Term Goal Evaluation of current treatment plan related to Pulmonary Disease, self-management and patient's adherence to plan as established by provider. Discussed plans with patient for ongoing care management follow up and provided patient with direct contact information for care management team Provided education to the patient about pneumonia, its causes, treatment options and prevention strategies (e.g. vaccinations) Education patient about continue use of incentive spirometer to promote lung expansion and to prevent atelectasis. Advise to complete all medications: prednisone taper Keep scheduled appointment   Patient Goals/Self-Care Activities: Take all medications as prescribed Attend all scheduled provider appointments Call pharmacy for medication refills 3-7 days in advance of running out of medications Attend church or other social activities Call provider office for new concerns or questions  call the Suicide and Crisis Lifeline: 988 call the [place] National Suicide Prevention Lifeline: 330-353-5417 or TTY: (548)095-1082 TTY 480-709-1115) to talk to a trained counselor call 1-800-273-TALK (toll free, 24 hour hotline) go to [facility] [address], [place] [phone]) call 911 if  experiencing a Mental Health or Behavioral Health Crisis  identify  and remove indoor air pollutants do breathing exercises every day begin a symptom diary eliminate symptom triggers at home write blood pressure results in a log or diary call doctor for signs and symptoms of high blood pressure  Follow Up Plan:  Telephone follow up appointment with care management team member scheduled for:  03-16-2023 at 2:00 pm           Follow up plan:  Telephone follow up appointment with care management team member scheduled for: 03-16-2023 at 2:00 pm  Ms. Arellano was given information about Care Management services today including:  Care Management services include personalized support from designated clinical staff supervised by a physician, including individualized plan of care and coordination with other care providers 24/7 contact phone numbers for assistance for urgent and routine care needs. The patient may stop CCM services at any time (effective at the end of the month) by phone call to the office staff.  Patient agreed to services and verbal consent obtained.  Danise Edge, BSN RN RN Care Manager    Ambulatory Care Management  Direct Number: [phone]

## 2023-03-02 ENCOUNTER — Other Ambulatory Visit (HOSPITAL_COMMUNITY): Payer: Self-pay

## 2023-03-02 ENCOUNTER — Other Ambulatory Visit (HOSPITAL_BASED_OUTPATIENT_CLINIC_OR_DEPARTMENT_OTHER): Payer: Self-pay | Admitting: Family Medicine

## 2023-03-02 MED ORDER — TORSEMIDE 10 MG PO TABS
10.0000 mg | ORAL_TABLET | ORAL | 3 refills | Status: DC
  Filled 2023-03-02: qty 36, 84d supply, fill #0

## 2023-03-03 DIAGNOSIS — D649 Anemia, unspecified: Secondary | ICD-10-CM | POA: Diagnosis not present

## 2023-03-03 DIAGNOSIS — K5730 Diverticulosis of large intestine without perforation or abscess without bleeding: Secondary | ICD-10-CM | POA: Diagnosis not present

## 2023-03-03 DIAGNOSIS — E1142 Type 2 diabetes mellitus with diabetic polyneuropathy: Secondary | ICD-10-CM | POA: Diagnosis not present

## 2023-03-03 DIAGNOSIS — I5032 Chronic diastolic (congestive) heart failure: Secondary | ICD-10-CM | POA: Diagnosis not present

## 2023-03-03 DIAGNOSIS — G4733 Obstructive sleep apnea (adult) (pediatric): Secondary | ICD-10-CM | POA: Diagnosis not present

## 2023-03-03 DIAGNOSIS — I951 Orthostatic hypotension: Secondary | ICD-10-CM | POA: Diagnosis not present

## 2023-03-03 DIAGNOSIS — U071 COVID-19: Secondary | ICD-10-CM | POA: Diagnosis not present

## 2023-03-03 DIAGNOSIS — I110 Hypertensive heart disease with heart failure: Secondary | ICD-10-CM | POA: Diagnosis not present

## 2023-03-03 DIAGNOSIS — J1282 Pneumonia due to coronavirus disease 2019: Secondary | ICD-10-CM | POA: Diagnosis not present

## 2023-03-04 DIAGNOSIS — U071 COVID-19: Secondary | ICD-10-CM | POA: Diagnosis not present

## 2023-03-04 DIAGNOSIS — K5730 Diverticulosis of large intestine without perforation or abscess without bleeding: Secondary | ICD-10-CM | POA: Diagnosis not present

## 2023-03-04 DIAGNOSIS — I110 Hypertensive heart disease with heart failure: Secondary | ICD-10-CM | POA: Diagnosis not present

## 2023-03-04 DIAGNOSIS — G4733 Obstructive sleep apnea (adult) (pediatric): Secondary | ICD-10-CM | POA: Diagnosis not present

## 2023-03-04 DIAGNOSIS — I5032 Chronic diastolic (congestive) heart failure: Secondary | ICD-10-CM | POA: Diagnosis not present

## 2023-03-04 DIAGNOSIS — E1142 Type 2 diabetes mellitus with diabetic polyneuropathy: Secondary | ICD-10-CM | POA: Diagnosis not present

## 2023-03-04 DIAGNOSIS — D649 Anemia, unspecified: Secondary | ICD-10-CM | POA: Diagnosis not present

## 2023-03-04 DIAGNOSIS — I951 Orthostatic hypotension: Secondary | ICD-10-CM | POA: Diagnosis not present

## 2023-03-04 DIAGNOSIS — J1282 Pneumonia due to coronavirus disease 2019: Secondary | ICD-10-CM | POA: Diagnosis not present

## 2023-03-08 ENCOUNTER — Telehealth (HOSPITAL_BASED_OUTPATIENT_CLINIC_OR_DEPARTMENT_OTHER): Payer: Self-pay | Admitting: Family Medicine

## 2023-03-08 DIAGNOSIS — E1142 Type 2 diabetes mellitus with diabetic polyneuropathy: Secondary | ICD-10-CM | POA: Diagnosis not present

## 2023-03-08 DIAGNOSIS — J1282 Pneumonia due to coronavirus disease 2019: Secondary | ICD-10-CM | POA: Diagnosis not present

## 2023-03-08 DIAGNOSIS — I951 Orthostatic hypotension: Secondary | ICD-10-CM | POA: Diagnosis not present

## 2023-03-08 DIAGNOSIS — I5032 Chronic diastolic (congestive) heart failure: Secondary | ICD-10-CM | POA: Diagnosis not present

## 2023-03-08 DIAGNOSIS — D649 Anemia, unspecified: Secondary | ICD-10-CM | POA: Diagnosis not present

## 2023-03-08 DIAGNOSIS — U071 COVID-19: Secondary | ICD-10-CM | POA: Diagnosis not present

## 2023-03-08 DIAGNOSIS — K5730 Diverticulosis of large intestine without perforation or abscess without bleeding: Secondary | ICD-10-CM | POA: Diagnosis not present

## 2023-03-08 DIAGNOSIS — I110 Hypertensive heart disease with heart failure: Secondary | ICD-10-CM | POA: Diagnosis not present

## 2023-03-08 DIAGNOSIS — G4733 Obstructive sleep apnea (adult) (pediatric): Secondary | ICD-10-CM | POA: Diagnosis not present

## 2023-03-08 NOTE — Telephone Encounter (Signed)
Alexis, please advise on this.

## 2023-03-08 NOTE — Telephone Encounter (Signed)
[name] with [facility] is calling  [phone]) to advise that the pt gained weight 252 to 257 and needs to be evaluated by [facility] Health Nurse

## 2023-03-10 ENCOUNTER — Telehealth (HOSPITAL_BASED_OUTPATIENT_CLINIC_OR_DEPARTMENT_OTHER): Payer: Self-pay | Admitting: Family Medicine

## 2023-03-10 DIAGNOSIS — I110 Hypertensive heart disease with heart failure: Secondary | ICD-10-CM | POA: Diagnosis not present

## 2023-03-10 DIAGNOSIS — I5032 Chronic diastolic (congestive) heart failure: Secondary | ICD-10-CM | POA: Diagnosis not present

## 2023-03-10 DIAGNOSIS — D649 Anemia, unspecified: Secondary | ICD-10-CM | POA: Diagnosis not present

## 2023-03-10 DIAGNOSIS — E1142 Type 2 diabetes mellitus with diabetic polyneuropathy: Secondary | ICD-10-CM | POA: Diagnosis not present

## 2023-03-10 DIAGNOSIS — J1282 Pneumonia due to coronavirus disease 2019: Secondary | ICD-10-CM | POA: Diagnosis not present

## 2023-03-10 DIAGNOSIS — U071 COVID-19: Secondary | ICD-10-CM | POA: Diagnosis not present

## 2023-03-10 DIAGNOSIS — G4733 Obstructive sleep apnea (adult) (pediatric): Secondary | ICD-10-CM | POA: Diagnosis not present

## 2023-03-10 DIAGNOSIS — K5730 Diverticulosis of large intestine without perforation or abscess without bleeding: Secondary | ICD-10-CM | POA: Diagnosis not present

## 2023-03-10 DIAGNOSIS — I951 Orthostatic hypotension: Secondary | ICD-10-CM | POA: Diagnosis not present

## 2023-03-10 NOTE — Telephone Encounter (Signed)
Paperwork from home health was received and given to [place] for her to sign.

## 2023-03-10 NOTE — Telephone Encounter (Signed)
[name], please advise on this what you recommend.

## 2023-03-10 NOTE — Telephone Encounter (Signed)
Tresa Endo is working with the patient for therapy, reporting SOB more today, weight 250, and has cough starting yesterday, right leg has a place on leg and is warm to tough and has redness mid shin to ankle. Not a big area but discolored and warm, no number left to call. Please call patient

## 2023-03-11 ENCOUNTER — Other Ambulatory Visit (HOSPITAL_COMMUNITY): Payer: Self-pay

## 2023-03-11 ENCOUNTER — Ambulatory Visit: Payer: Self-pay | Admitting: Licensed Clinical Social Worker

## 2023-03-11 DIAGNOSIS — E1142 Type 2 diabetes mellitus with diabetic polyneuropathy: Secondary | ICD-10-CM | POA: Diagnosis not present

## 2023-03-11 DIAGNOSIS — I951 Orthostatic hypotension: Secondary | ICD-10-CM | POA: Diagnosis not present

## 2023-03-11 DIAGNOSIS — U071 COVID-19: Secondary | ICD-10-CM | POA: Diagnosis not present

## 2023-03-11 DIAGNOSIS — G4733 Obstructive sleep apnea (adult) (pediatric): Secondary | ICD-10-CM | POA: Diagnosis not present

## 2023-03-11 DIAGNOSIS — I5032 Chronic diastolic (congestive) heart failure: Secondary | ICD-10-CM | POA: Diagnosis not present

## 2023-03-11 DIAGNOSIS — M25519 Pain in unspecified shoulder: Secondary | ICD-10-CM

## 2023-03-11 DIAGNOSIS — J1282 Pneumonia due to coronavirus disease 2019: Secondary | ICD-10-CM | POA: Diagnosis not present

## 2023-03-11 DIAGNOSIS — I110 Hypertensive heart disease with heart failure: Secondary | ICD-10-CM | POA: Diagnosis not present

## 2023-03-11 DIAGNOSIS — I10 Essential (primary) hypertension: Secondary | ICD-10-CM | POA: Diagnosis not present

## 2023-03-11 DIAGNOSIS — G8929 Other chronic pain: Secondary | ICD-10-CM

## 2023-03-11 DIAGNOSIS — M542 Cervicalgia: Secondary | ICD-10-CM | POA: Diagnosis not present

## 2023-03-11 DIAGNOSIS — Z9181 History of falling: Secondary | ICD-10-CM

## 2023-03-11 NOTE — Telephone Encounter (Signed)
Called and spoke with pt letting her know the info per [employer]. Pt said she did not think she could be able to get a ride to come into the office today, 11/22. Told her that we did recommend her to go to either UC or ED for evaluation due to her leg needing to be checked out to rule out DVT. Pt verbalized understanding. Nothing further needed.
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
## 2023-03-13 DIAGNOSIS — J1282 Pneumonia due to coronavirus disease 2019: Secondary | ICD-10-CM | POA: Diagnosis not present

## 2023-03-13 DIAGNOSIS — G4733 Obstructive sleep apnea (adult) (pediatric): Secondary | ICD-10-CM | POA: Diagnosis not present

## 2023-03-13 DIAGNOSIS — I5032 Chronic diastolic (congestive) heart failure: Secondary | ICD-10-CM | POA: Diagnosis not present

## 2023-03-13 DIAGNOSIS — D649 Anemia, unspecified: Secondary | ICD-10-CM | POA: Diagnosis not present

## 2023-03-13 DIAGNOSIS — K5730 Diverticulosis of large intestine without perforation or abscess without bleeding: Secondary | ICD-10-CM | POA: Diagnosis not present

## 2023-03-13 DIAGNOSIS — E1142 Type 2 diabetes mellitus with diabetic polyneuropathy: Secondary | ICD-10-CM | POA: Diagnosis not present

## 2023-03-13 DIAGNOSIS — I110 Hypertensive heart disease with heart failure: Secondary | ICD-10-CM | POA: Diagnosis not present

## 2023-03-13 DIAGNOSIS — I951 Orthostatic hypotension: Secondary | ICD-10-CM | POA: Diagnosis not present

## 2023-03-13 DIAGNOSIS — U071 COVID-19: Secondary | ICD-10-CM | POA: Diagnosis not present

## 2023-03-13 NOTE — Progress Notes (Unsigned)
Cardiology Office Note:    Date:  03/14/2023  ID:  [name], DOB 1955-05-28, MRN [id number] PCP: Hilbert Bible, FNP  [facility] Providers Cardiologist:  Bryan Lemma, MD Cardiology APP:  Marcelino Duster, [place]       Patient Profile:      [name].  [name] is a 67 year old female with history notable for morbid obesity, hypertension, chronic diastolic CHF, type 2 diabetes, bilateral DVTs s/p IVC filter 2009, hypertension, OSA on CPAP, and documented sedentary lifestyle.  Cardiac CT 09/21/2019 ordered in the setting of reported chest tightness and shortness of breath with ambulation showing calcium score 117 which was 83 percentile for age and sex, minimal nonobstructive CAD.  Echocardiogram 12/2020 with LVEF 55-60%, grade 1 diastolic dysfunction, moderately dilated left atrium, mild MR, and 39 mm ascending aorta.  Seen by Micah Flesher, PA on 09/05/2021 where she noted her Apple Watch reported episodes of A-fib.  She had reported some palpitations and DOE with some lightheadedness with increased exertion.  14-day Zio patch was ordered showing heart rate range 45 to 135 bpm, average 73 bpm.  24 episodes of supraventricular tachycardia/atrial tachycardia, fastest was 11 beats at 184 bpm, longest was 13 beats an average rate of 92 bpm.  No atrial fib noted.  Was seen by Dr. Herbie Baltimore on 10/06/2021 where she noted ongoing DOE and chest pain on breathing.  She was noted to have orthopnea and torsemide was ordered that she was to take 2-3 times a week.  CPX was ordered and completed on 10/27/2021 showing low normal functional capacity with no clear cardiopulmonary limitation, overall limitation felt to be primarily due to vitamin D habitus/obesity and deconditioning tolerance.  She was last seen in office on 12/23/2021 by Dr. Herbie Baltimore at that time she continued to be short of breath and experience DOE.  It was noted this is likely due to her morbid obesity as her diastolic  dysfunction and reassuring CPX did not explain the ongoing DOE.  She was urged to start exercising.  A sleep study was ordered due to her Epworth score of 17.    She was admitted to the hospital from 01/15/2023 to 01/25/2023 with community-acquired pneumonia and COVID.  She was admitted again from 02/18/2023 to 02/24/2023 with ongoing shortness of breath and fatigue following her previous discharge.  CTA chest on admission ruled out pulmonary embolism which showed small area of groundglass off of 68 and left upper lobe which was new from her prior admission.  Diagnosed with healthcare associated pneumonia and discharged on Vantin and prednisone. Echocardiogram was ordered and completed on 03/23/2023 showing LVEF 60-65%, no RWMA, grade 1 DD, RV SF normal      History of Present Illness:   [name] is a 67 y.o. female who returns for 1 year follow-up and hospital follow-up  Today, she is doing well from a cardiac standpoint.  She denies any exertional angina.  She notes that since her admission for pneumonia and COVID her chronic shortness of breath and DOE have worsened.  She notes that she has daily orthopnea and has had 2-3 episodes of PND over the last 3 to 4 months.  She tells me that on days that she takes her furosemide which is 3 times weekly she does feel less fluid overloaded and can breathe better throughout the day.  She notes mild weight gain over the past 2 to 3 months.  She admits to not eating healthy, she notes that she lives  by herself and will typically eat out instead of cooking at home.  She notes drinking coffee throughout most of the day and using sugar to mix.  She does note that she has had some right leg swelling after cutting her leg last month.  Her PCP has ordered a DVT study and prescribed her antibiotics for right leg cellulitis.  Of note she does have appointment with pulmonology on 05/08/2022 to establish care.  She denies any chest pain, lightheadedness, dizziness,  syncope, palpitations, irregular heartbeats.        Review of Systems  Constitutional: Positive for weight gain. Negative for weight loss.  Cardiovascular:  Positive for dyspnea on exertion, orthopnea and paroxysmal nocturnal dyspnea. Negative for chest pain, claudication, cyanosis, irregular heartbeat, leg swelling, near-syncope, palpitations and syncope.  Respiratory:  Positive for shortness of breath. Negative for cough and hemoptysis.   Gastrointestinal:  Negative for abdominal pain, hematochezia and melena.  Genitourinary:  Negative for hematuria.     See HPI    Studies Reviewed:       Echocardiogram 01/21/2023 1. Left ventricular ejection fraction, by estimation, is 60 to 65%. The  left ventricle has normal function. The left ventricle has no regional  wall motion abnormalities. Left ventricular diastolic parameters are  consistent with Grade I diastolic  dysfunction (impaired relaxation).   2. Right ventricular systolic function is normal. The right ventricular  size is normal.   3. The mitral valve is normal in structure. Trivial mitral valve  regurgitation. No evidence of mitral stenosis.   4. Tricuspid valve regurgitation is mild to moderate.   5. The aortic valve has an indeterminant number of cusps. Aortic valve  regurgitation is not visualized. No aortic stenosis is present.   6. There is borderline dilatation of the aortic root, measuring 37 mm.   7. The inferior vena cava is normal in size with greater than 50%  respiratory variability, suggesting right atrial pressure of 3 mmHg.   Cardiopulmonary exercise test 10/27/2021 Conclusion: Exercise testing with gas exchange demonstrates low-normal functional capacity when compared to matched sedentary norms. There is no clear cardiopulmonary limitation. However, patient's body habitus and deconditioning is greatly contributing to exercise intolerance (PVO2 improves 16% with corrections for ideal body weight). Elevated VE/VCO2  slope is likely related to hyperventilation at peak exercise with low breathing reserve and high respiratory rate.   ZIO 08/28/2021   Predominant Underlying Rhythm Was Sinus Rhythm: Heart rate range 45 to 135 bpm.  Average 73 bpm.   24 episodes of Supraventricular Tachycardia (SVT)/Atrial Tachycardia (atrial runs): Fastest was 11 beats at 184 bpm, longest was 13 beats at an average rate 92 bpm. => Episodes were noted on log.   Occasional PACs (1.3%) with rare couplets and triplets.  Rare PVCs.  Echo 01/06/2021 1. Left ventricular ejection fraction, by estimation, is 55 to 60%. The  left ventricle has normal function. The left ventricle has no regional  wall motion abnormalities. There is mild left ventricular hypertrophy.  Left ventricular diastolic parameters  are consistent with Grade I diastolic dysfunction (impaired relaxation).   2. Right ventricular systolic function is normal. The right ventricular  size is normal. There is normal pulmonary artery systolic pressure.   3. Left atrial size was moderately dilated.   4. The mitral valve is normal in structure. Mild mitral valve  regurgitation. No evidence of mitral stenosis.   5. The aortic valve is tricuspid. Aortic valve regurgitation is trivial.  No aortic stenosis is present.  6. Aortic dilatation noted. There is borderline dilatation of the aortic  root, measuring 39 mm.   7. The inferior vena cava is normal in size with greater than 50%  respiratory variability, suggesting right atrial pressure of 3 mmHg.   Coronary CTA 09/21/2019 1. Coronary calcium score of 0. This was 0 percentile for age and sex matched control.   2. Normal coronary origin with right dominance.   3. CAD-RADS 1. Minimal non-obstructive CAD (0-24%). Consider non-atherosclerotic causes of chest pain. Consider preventive therapy and risk factor modification.  Echocardiogram 03/28/2019 1. Left ventricular ejection fraction, by visual estimation, is 60 to   65%. The left ventricle has normal function. There is mildly increased  left ventricular hypertrophy.   2. The left ventricle has no regional wall motion abnormalities.   3. Global right ventricle has normal systolic function.The right  ventricular size is normal. No increase in right ventricular wall  thickness.   4. Left atrial size was normal.   5. Right atrial size was mildly dilated.   6. The mitral valve is normal in structure. Trivial mitral valve  regurgitation. No evidence of mitral stenosis.   7. The tricuspid valve is normal in structure. Tricuspid valve  regurgitation is mild.   8. The aortic valve is tricuspid. Aortic valve regurgitation is not  visualized. No evidence of aortic valve sclerosis or stenosis.   9. Pulmonic regurgitation is mild.  10. The pulmonic valve was normal in structure. Pulmonic valve  regurgitation is mild.  Risk Assessment/Calculations:         STOP-Bang Score:  7      Physical Exam:   VS:  BP 130/60   Pulse 92   Ht 5\' 5"  (1.651 m)   Wt 255 lb (115.7 kg)   SpO2 97%   BMI 42.43 kg/m    Wt Readings from Last 3 Encounters:  03/14/23 255 lb (115.7 kg)  03/14/23 255 lb 3.2 oz (115.8 kg)  02/19/23 250 lb 1.8 oz (113.5 kg)    Constitutional:      Appearance: Normal and healthy appearance.  HENT:     Head: Normocephalic.  Neck:     Vascular: JVD normal.  Pulmonary:     Effort: Pulmonary effort is normal.     Breath sounds: Normal breath sounds.  Chest:     Chest wall: Not tender to palpatation.  Cardiovascular:     PMI at left midclavicular line. Normal rate. Regular rhythm. Normal S1. Normal S2.      Murmurs: There is a grade 1/6 systolic murmur.     No gallop.  No click. No rub.  Pulses:    Intact distal pulses.  Edema:    Peripheral edema absent.  Musculoskeletal: Normal range of motion.     Cervical back: Normal range of motion and neck supple. Skin:    General: Skin is warm and dry.  Neurological:     General: No focal  deficit present.     Mental Status: Alert, oriented to person, place, and time and oriented to person, place and time.  Psychiatric:        Attention and Perception: Attention and perception normal.        Mood and Affect: Mood normal.        Behavior: Behavior is cooperative.        Thought Content: Thought content normal.        Assessment and Plan:  Chronic diastolic heart failure/DOE -Echocardiogram 01/21/2023 with LVEF 60-65%, no RWMA,  grade 1 diastolic dysfunction -CPX 10/27/2021 with no clear coronary pulmonary component to her SOB -She noted improvement in symptoms on days she took her torsemide versus day she did not -She notes worsening of her chronic SOB, DOE, orthopnea since her pneumonia/COVID admission on 12/2022 -Sedentary lifestyle and obesity likely contributing factors in noted symptoms   -She plans to establish care with pulmonology on 05/09/2023 for ongoing SOB/DOE -Plan to increase torsemide 10 mg once daily. Monitor daily weights, <2g sodium restriction, <2L fluid retcition  -Repeat BMP x 1 to 2 weeks  Hypertension -BP today 130/60, well-controlled -Tight control due to diastolic dysfunction -Continue amlodipine 10 mg  OSA -STOP-BANG score 7 -She needs to be reassessed for sleep apnea, sleep study ordered last year on 12/2021, patient never followed up on this -She will need to get reestablished with CPAP -Recommend she discuss with pulmonology on her visit 04/2023   Obesity -This is likely a large contributor to her SOB -We spoke regarding heart healthy and Mediterranean dieting -Discussed increasing fruits and vegetables and fiber in diet decrease fatty foods and sugar -We had a long discussion regarding nutrition and losing weight.  She would like to begin working out but she has not had the motivation to lose weight at this  -Encouraged her to find family member/friend/neighbor to make lifestyle changes with to boost her motivation.                     Dispo:  Return in about 4 months (around 07/12/2023).  Signed, Denyce Robert, NP

## 2023-03-14 ENCOUNTER — Other Ambulatory Visit: Payer: Self-pay

## 2023-03-14 ENCOUNTER — Encounter (HOSPITAL_COMMUNITY): Payer: Self-pay

## 2023-03-14 ENCOUNTER — Encounter (HOSPITAL_BASED_OUTPATIENT_CLINIC_OR_DEPARTMENT_OTHER): Payer: Self-pay | Admitting: Family Medicine

## 2023-03-14 ENCOUNTER — Encounter: Payer: Self-pay | Admitting: Nurse Practitioner

## 2023-03-14 ENCOUNTER — Ambulatory Visit (INDEPENDENT_AMBULATORY_CARE_PROVIDER_SITE_OTHER): Payer: Medicare HMO | Admitting: Family Medicine

## 2023-03-14 ENCOUNTER — Ambulatory Visit
Admission: RE | Admit: 2023-03-14 | Discharge: 2023-03-14 | Disposition: A | Discharge status: Home / Self Care | Payer: Medicare HMO | Source: Ambulatory Visit | Attending: Family Medicine | Admitting: Family Medicine

## 2023-03-14 ENCOUNTER — Ambulatory Visit (INDEPENDENT_AMBULATORY_CARE_PROVIDER_SITE_OTHER): Payer: Medicare HMO

## 2023-03-14 ENCOUNTER — Ambulatory Visit (HOSPITAL_COMMUNITY): Payer: Medicare HMO

## 2023-03-14 ENCOUNTER — Ambulatory Visit: Payer: Medicare HMO | Admitting: Nurse Practitioner

## 2023-03-14 VITALS — BP 158/89 | HR 87 | Ht 65.0 in | Wt 255.2 lb

## 2023-03-14 VITALS — BP 130/60 | HR 92 | Ht 65.0 in | Wt 255.0 lb

## 2023-03-14 DIAGNOSIS — I5032 Chronic diastolic (congestive) heart failure: Secondary | ICD-10-CM

## 2023-03-14 DIAGNOSIS — R2241 Localized swelling, mass and lump, right lower limb: Secondary | ICD-10-CM | POA: Insufficient documentation

## 2023-03-14 DIAGNOSIS — I10 Essential (primary) hypertension: Secondary | ICD-10-CM

## 2023-03-14 DIAGNOSIS — D649 Anemia, unspecified: Secondary | ICD-10-CM | POA: Diagnosis not present

## 2023-03-14 DIAGNOSIS — Z09 Encounter for follow-up examination after completed treatment for conditions other than malignant neoplasm: Secondary | ICD-10-CM | POA: Diagnosis not present

## 2023-03-14 DIAGNOSIS — J189 Pneumonia, unspecified organism: Secondary | ICD-10-CM

## 2023-03-14 DIAGNOSIS — G4733 Obstructive sleep apnea (adult) (pediatric): Secondary | ICD-10-CM | POA: Diagnosis not present

## 2023-03-14 DIAGNOSIS — Y95 Nosocomial condition: Secondary | ICD-10-CM | POA: Diagnosis not present

## 2023-03-14 DIAGNOSIS — Z79899 Other long term (current) drug therapy: Secondary | ICD-10-CM

## 2023-03-14 DIAGNOSIS — I517 Cardiomegaly: Secondary | ICD-10-CM | POA: Diagnosis not present

## 2023-03-14 DIAGNOSIS — R0609 Other forms of dyspnea: Secondary | ICD-10-CM

## 2023-03-14 MED ORDER — DOXYCYCLINE HYCLATE 100 MG PO TABS: 100.0000 mg | ORAL_TABLET | Freq: Two times a day (BID) | ORAL | 0 refills | Status: AC

## 2023-03-14 MED ORDER — TORSEMIDE 10 MG PO TABS: 10.0000 mg | ORAL_TABLET | Freq: Every day | ORAL | 3 refills | Status: DC

## 2023-03-14 MED ORDER — AMOXICILLIN 875 MG PO TABS: 875.0000 mg | ORAL_TABLET | Freq: Two times a day (BID) | ORAL | 0 refills | Status: DC
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
## 2023-03-14 NOTE — Patient Instructions (Signed)
Medication Instructions:  Torsemide 10 mg daily  *If you need a refill on your cardiac medications before your next appointment, please call your pharmacy*   Lab Work: BMET in 2 weeks  Testing/Procedures: NONE ordered at this time of appointment   Follow-Up: At [facility], you and your health needs are our priority.  As part of our continuing mission to provide you with exceptional heart care, we have created designated Provider Care Teams.  These Care Teams include your primary Cardiologist (physician) and Advanced Practice Providers (APPs -  Physician Assistants and Nurse Practitioners) who all work together to provide you with the care you need, when you need it.  We recommend signing up for the patient portal called "MyChart".  Sign up information is provided on this After Visit Summary.  MyChart is used to connect with patients for Virtual Visits (Telemedicine).  Patients are able to view lab/test results, encounter notes, upcoming appointments, etc.  Non-urgent messages can be sent to your provider as well.   To learn more about what you can do with MyChart, go to [email or web address].    Your next appointment:   4 month(s)  Provider:   Any APP    Other Instructions

## 2023-03-14 NOTE — Patient Instructions (Addendum)
Restart your Torsemide  Go downstairs to 2nd floor for your Chest xray.   If your leg is not improving by Wednesday, please call my office [phone]

## 2023-03-14 NOTE — Progress Notes (Deleted)
Aleen Sells D.[facility] [address] [place] [address] Phone: [phone]   Assessment and Plan:     There are no diagnoses linked to this encounter.  ***   Pertinent previous records reviewed include ***    Follow Up: ***     Subjective:   I, [name], am serving as a Neurosurgeon for Doctor Richardean Sale   Chief Complaint: right knee pain    HPI:  04/01/2022 [name] is a 67 y.o. female who presents to [facility] at [facility] today for knee pain. Pt was previously seen by Dr. Katrinka Blazing on 02/25/22 for polyarthralgia, including R knee pain. Pt suffered a fall on last Thurs, 12/7, tripping on the leg of a chair, falling on her R side. Pt locates pain to midline of her neck, the anterior aspect of the L hip, and the anterior aspect of the R knee.    She had a right knee injection in early November with my partner Dr. Katrinka Blazing.   R Knee swelling: yes Mechanical symptoms: no Aggravates: walking Treatments tried: ice, heat, elevation, Tylenol, Gabapentin, Tramadol   Dx testing: 02/25/22 L-spine XR & Labs             02/06/20 R knee XR   Pertinent review of systems: No fevers or chills   Relevant historical information: Hypertension   04/08/2022 Patient states   02/16/2023 Patient states pain in the hip that radiates down to her knee . Leg goes numb and is painful at night when she lays down . Ice and heat calm down the pain. Pain when walking . The pain is stronger and radiating from the lst time she saw [place]    03/16/2023 Patient states  Relevant Historical Information: Hypertension, fibromyalgia  Additional pertinent review of systems negative.   Current Outpatient Medications:    albuterol (PROVENTIL) (2.5 MG/3ML) 0.083% nebulizer solution, Take 3 mLs (2.5 mg total) by nebulization every 6 (six) hours as needed for wheezing or shortness of breath., Disp: 100 mL, Rfl: 1   albuterol (VENTOLIN HFA)  108 (90 Base) MCG/ACT inhaler, INHALE 2 PUFFS INTO THE LUNGS EVERY 6 HOURS AS NEEDED FOR WHEEZING OR SHORTNESS OF BREATH, Disp: 3 each, Rfl: 2   amLODipine (NORVASC) 10 MG tablet, Take 1 tablet (10 mg total) by mouth daily., Disp: 90 tablet, Rfl: 3   amoxicillin (AMOXIL) 875 MG tablet, Take 1 tablet (875 mg total) by mouth 2 (two) times daily for 7 days., Disp: 14 tablet, Rfl: 0   Ascorbic Acid (VITAMIN C) 1000 MG tablet, Take 1,000 mg by mouth daily., Disp: , Rfl:    aspirin EC 81 MG tablet, Take 1 tablet (81 mg total) by mouth daily. Swallow whole., Disp: 30 tablet, Rfl: 12   benzonatate (TESSALON) 200 MG capsule, Take 1 capsule (200 mg total) by mouth 3 (three) times daily as needed for cough., Disp: 45 capsule, Rfl: 0   buPROPion (WELLBUTRIN XL) 300 MG 24 hr tablet, Take 1 tablet (300 mg total) by mouth daily., Disp: 90 tablet, Rfl: 3   chlorpheniramine-HYDROcodone (TUSSIONEX) 10-8 MG/5ML, Take 5 mLs by mouth every 12 (twelve) hours as needed for cough., Disp: 115 mL, Rfl: 0   dextromethorphan-guaiFENesin (MUCINEX DM) 30-600 MG 12hr tablet, Take 1 tablet by mouth 2 (two) times daily as needed for cough., Disp: , Rfl:    doxycycline (VIBRA-TABS) 100 MG tablet, Take 1 tablet (100 mg total) by mouth 2 (two) times daily  for 5 days., Disp: 10 tablet, Rfl: 0   ferrous sulfate 325 (65 FE) MG tablet, Take 1 tablet (325 mg total) by mouth daily with breakfast., Disp: 30 tablet, Rfl: 3   fluticasone-salmeterol (ADVAIR) 100-50 MCG/ACT AEPB, Inhale 1 puff into the lungs 2 (two) times daily., Disp: 60 each, Rfl: 1   gabapentin (NEURONTIN) 100 MG capsule, Take 2 capsules (200 mg total) by mouth 2 (two) times daily., Disp: , Rfl:    guaiFENesin (MUCINEX) 600 MG 12 hr tablet, Take 1 tablet (600 mg total) by mouth 2 (two) times daily., Disp: 30 tablet, Rfl: 0   ipratropium (ATROVENT) 0.03 % nasal spray, Place 2 sprays into both nostrils every 12 (twelve) hours. X 5-7 days (Patient taking differently: Place 2 sprays  into both nostrils as needed for rhinitis.), Disp: 30 mL, Rfl: 0   lipase/protease/amylase (CREON) 36000 UNITS CPEP capsule, Take 36,000 Units by mouth 4 (four) times daily as needed (Stomach Pain)., Disp: , Rfl:    loperamide (IMODIUM) 2 MG capsule, Take 1 capsule (2 mg total) by mouth as needed for diarrhea or loose stools., Disp: 30 capsule, Rfl: 0   MAGNESIUM PO, Take 1 tablet by mouth daily., Disp: , Rfl:    meloxicam (MOBIC) 15 MG tablet, Take 1 tablet (15 mg total) by mouth daily., Disp: 30 tablet, Rfl: 0   Menthol, Topical Analgesic, (BIOFREEZE EX), Apply 1 Application topically as needed (Pain)., Disp: , Rfl:    montelukast (SINGULAIR) 10 MG tablet, Take 1 tablet (10 mg total) by mouth at bedtime., Disp: 90 tablet, Rfl: 3   Multiple Vitamin (MULTI-VITAMIN) tablet, Take 1 tablet by mouth daily., Disp: , Rfl:    ondansetron (ZOFRAN) 4 MG tablet, Take 1 tablet (4 mg total) by mouth every 8 (eight) hours as needed for nausea or vomiting., Disp: 30 tablet, Rfl: 0   oxyCODONE (OXY IR/ROXICODONE) 5 MG immediate release tablet, Take 1 tablet (5 mg total) by mouth every 6 (six) hours as needed for severe pain or moderate pain., Disp: 15 tablet, Rfl: 0   pantoprazole (PROTONIX) 40 MG tablet, Take 1 tablet (40 mg total) by mouth daily., Disp: 30 tablet, Rfl: 3   PARoxetine (PAXIL) 20 MG tablet, Take 1 tablet (20 mg total) by mouth daily., Disp: 90 tablet, Rfl: 3   psyllium (HYDROCIL/METAMUCIL) 95 % PACK, Take 1 packet by mouth 2 (two) times daily. (Patient taking differently: Take 1 packet by mouth 2 (two) times daily as needed for mild constipation.), Disp: 240 each, Rfl:    torsemide (DEMADEX) 10 MG tablet, Take 1 tablet (10 mg total) by mouth daily., Disp: 90 tablet, Rfl: 3   traMADol (ULTRAM) 50 MG tablet, Take 1 tablet (50 mg total) by mouth 2 (two) times daily., Disp: , Rfl:    traZODone (DESYREL) 50 MG tablet, Take 1 tablet (50 mg total) by mouth at bedtime as needed for sleep., Disp: 90 tablet,  Rfl: 1   VITAMIN D PO, Take 1 capsule by mouth daily., Disp: , Rfl:    Objective:     There were no vitals filed for this visit.    There is no height or weight on file to calculate BMI.    Physical Exam:    ***   Electronically signed by:  Aleen Sells [facility] 4:08 PM 03/14/23

## 2023-03-14 NOTE — Progress Notes (Signed)
Reviewed chest x-ray results with the patient.  Will await labs.  Pneumonia resolved per chest x-ray.  Will continue with pulmonology follow-up as directed.

## 2023-03-14 NOTE — Progress Notes (Signed)
Right lower leg ultrasound negative for DVT.  Likely cellulitis.  Patient will start dual antibiotic therapy and follow-up in 48 hours if no improvement.

## 2023-03-15 ENCOUNTER — Ambulatory Visit (HOSPITAL_BASED_OUTPATIENT_CLINIC_OR_DEPARTMENT_OTHER): Payer: Medicare HMO | Admitting: Family Medicine

## 2023-03-15 ENCOUNTER — Encounter (HOSPITAL_COMMUNITY): Payer: Medicare HMO

## 2023-03-15 ENCOUNTER — Ambulatory Visit (INDEPENDENT_AMBULATORY_CARE_PROVIDER_SITE_OTHER): Payer: Medicare HMO | Admitting: Licensed Clinical Social Worker

## 2023-03-15 ENCOUNTER — Encounter (HOSPITAL_BASED_OUTPATIENT_CLINIC_OR_DEPARTMENT_OTHER): Payer: Self-pay

## 2023-03-15 DIAGNOSIS — F331 Major depressive disorder, recurrent, moderate: Secondary | ICD-10-CM

## 2023-03-15 LAB — CBC WITH DIFFERENTIAL/PLATELET
Basophils Absolute: 0 10*3/uL (ref 0.0–0.2)
Basos: 0 %
EOS (ABSOLUTE): 0.1 10*3/uL (ref 0.0–0.4)
Eos: 1 %
Hematocrit: 36.2 % (ref 34.0–46.6)
Hemoglobin: 11.2 g/dL (ref 11.1–15.9)
Immature Grans (Abs): 0 10*3/uL (ref 0.0–0.1)
Immature Granulocytes: 0 %
Lymphocytes Absolute: 1 10*3/uL (ref 0.7–3.1)
Lymphs: 13 %
MCH: 26.7 pg (ref 26.6–33.0)
MCHC: 30.9 g/dL — ABNORMAL LOW (ref 31.5–35.7)
MCV: 86 fL (ref 79–97)
Monocytes Absolute: 0.6 10*3/uL (ref 0.1–0.9)
Monocytes: 8 %
Neutrophils Absolute: 6 10*3/uL (ref 1.4–7.0)
Neutrophils: 78 %
Platelets: 231 10*3/uL (ref 150–450)
RBC: 4.2 x10E6/uL (ref 3.77–5.28)
RDW: 16 % — ABNORMAL HIGH (ref 11.7–15.4)
WBC: 7.6 10*3/uL (ref 3.4–10.8)

## 2023-03-15 LAB — BASIC METABOLIC PANEL
BUN/Creatinine Ratio: 16 (ref 12–28)
BUN: 14 mg/dL (ref 8–27)
CO2: 17 mmol/L — ABNORMAL LOW (ref 20–29)
Calcium: 9.1 mg/dL (ref 8.7–10.3)
Chloride: 109 mmol/L — ABNORMAL HIGH (ref 96–106)
Creatinine, Ser: 0.87 mg/dL (ref 0.57–1.00)
Glucose: 140 mg/dL — ABNORMAL HIGH (ref 70–99)
Potassium: 3.7 mmol/L (ref 3.5–5.2)
Sodium: 143 mmol/L (ref 134–144)
eGFR: 72 mL/min/{1.73_m2} (ref >59)

## 2023-03-15 NOTE — Progress Notes (Signed)
[facility] Counselor/Therapist Progress Note  Patient ID: [name], MRN: [id number]    Date: 03/15/23  Time Spent: 0905  am - 0950 am : 45 Minutes  Treatment Type: Individual Therapy.  Reported Symptoms: Depression related to decrease in health.  Mental Status Exam: Appearance:  Unable to see patient, her camera wasn't working.      Behavior: Drowsy  Motor: Normal  Speech/Language:  Slurred  Affect: Unable to see patient due to her camera not working.  Mood: depressed  Thought process: normal  Thought content:   WNL  Sensory/Perceptual disturbances:   WNL  Orientation: oriented to person, place, time/date, situation, day of week, month of year, and year  Attention: Good  Concentration: Fair  Memory: WNL  Fund of knowledge:  Good  Insight:   Fair  Judgment:  Fair  Impulse Control: Good   Risk Assessment: Danger to Self:  No Self-injurious Behavior: No Danger to Others: No Duty to Warn:no Physical Aggression / Violence:No  Access to Firearms a concern: No  Gang Involvement:No   Subjective:   [name] participated from home, via video, and consented to treatment. Therapist participated from office. We met online due to patient request.   Patient presented online for her session identifying that she was very ill. She reported that she has been in the hospital off and on since September 30 th. Patient was very tired and lethargic during the session. She reports that she went to her PCP yesterday and received additional antibiotics. Patient reports feeling hopeless and uncertain of the outcome of her health situation. She states that she feels that it has gone on so long that there is no end in sight. Patient reports feeling fearful of her future and not knowing how she will accomplish all of her task that need to be completed.   Interventions: Cognitive Behavioral Therapy  Diagnosis: Major Depressive Disorder, recurrent,  moderate   [name] will reduce the severity of depressive symptoms by 50% within 6 months to a year, through consistent engagement in treatment, including medication adherence, therapy sessions, and self-care practices focused on getting up each day and not staying in bed, with the aim of improving daily functioning in areas like  social interaction, personal hygiene by 50%.  Symptom reduction: Decrease the frequency and intensity of depressive symptoms like sadness, hopelessness, fatigue, and loss of interest in activities.  Identify and manage specific triggers that worsen symptoms.  Functional improvement: Enhance daily living skills like hygiene, meal preparation, and maintaining a regular sleep schedule.  Improve engagement in social activities and relationships.  Maintain daily responsibilities to a reasonable level.  Cognitive changes: Challenge negative thought patterns and replace them with more positive thinking.  Develop coping mechanisms to manage stress and difficult emotions.  Self-care practices: Regular physical exercise  Healthy eating habits  Sufficient sleep hygiene  Relaxation techniques like meditation or deep breathing  Short-term goals  Attend therapy sessions consistently.  Identify and list 3 positive activities to engage in daily. Increase sleep duration by 30 minutes per night.  Practice relaxation techniques for 10 minutes daily.  Long-term goals  Reduce depressive symptom severity by 50% on a rating scale.  Engage in one social activity per week with friends or family.  Begin to identify and challenge negative thoughts.  Increase physical activity to 30 minutes most days.  Achieve sustained remission from depressive symptoms.  Maintain a healthy lifestyle with regular exercise and balanced diet.  Develop a strong support network  to manage stress and challenges.  Effectively manage potential relapse.    [name] MSW, LCSW DATE: 03/15/2023

## 2023-03-15 NOTE — Progress Notes (Signed)
Hi [name], Your kidney function looks great and your electrolytes were normal.  Your blood white blood cell count does not show a systemic infection from the cellulitis and your hemoglobin levels have improved as well.  If you have further questions or you do not feel that the antibiotics are working well please let me know.  I will see you next week

## 2023-03-16 ENCOUNTER — Other Ambulatory Visit: Payer: Self-pay | Admitting: *Deleted

## 2023-03-16 ENCOUNTER — Ambulatory Visit: Payer: Medicare HMO | Admitting: Sports Medicine

## 2023-03-16 ENCOUNTER — Encounter (HOSPITAL_BASED_OUTPATIENT_CLINIC_OR_DEPARTMENT_OTHER): Payer: Self-pay | Admitting: Family Medicine

## 2023-03-16 ENCOUNTER — Ambulatory Visit: Payer: Self-pay | Admitting: Licensed Clinical Social Worker

## 2023-03-16 NOTE — Telephone Encounter (Signed)
Please see mychart message from pt as an FYI.

## 2023-03-16 NOTE — Patient Instructions (Signed)
Visit Information  Thank you for taking time to visit with me today. Please don't hesitate to contact me if I can be of assistance to you before our next scheduled telephone appointment.  Following are the goals we discussed today:   Goals Addressed             This Visit's Progress    RNCM Care Managment Expected Outcome: Monitor, Self-Manage and Reduced Symptoms of: CHF, Hypertension, DM, Depression       Current Barriers:  Knowledge Deficits related to plan of care for management of CHF, HTN, DMII, Depression, and Pulmonary Disease  Care Coordination needs related to Limited access to food and Lack of essential utilities - [employer]* Chronic Disease Management support and education needs related to CHF, HTN, DMII, Depression, and Pulmonary Disease  Lacks caregiver support Environmental consultant barriers  Patient states that she was recently treated for cellulitis of her lower leg and was given Amoxil 875 and Doxycycline. She states that her leg is doing much better at this time. At the time of the call, she noticed a scratch to her lower leg and RNCM advised that she cleanse the area well and monitor for any S/S infection.  Patient is also requesting her pulmonary visit be changed to [facility], RNCM to follow up to see if this change can be completed.  RNCM Clinical Goal(s):  Patient will verbalize basic understanding of  CHF, HTN, DMII, and Depression disease process and self health management plan as evidenced by verbal explanation, recognizing symptoms and when to seek help, lifestyle modifications and daily monitoring take all medications exactly as prescribed and will call provider for medication related questions as evidenced by compliance with all medications attend all scheduled medical appointments: with primary care provider and specialist as evidenced by maintaining all scheduled appointments demonstrate Improved and Ongoing adherence to  prescribed treatment plan for CHF, HTN, DMII, Depression, and Pulmonary Disease as evidenced by consistent medication compliance, symptom monitoring, continued lifestyle modifications, daily weights and continued self-management continue to work with RN Care Manager to address care management and care coordination needs related to  CHF, HTN, DMII, and Depression as evidenced by adherence to CM Team Scheduled appointments work with community resource care guide to address needs related to  Lack of essential utilities - electricity* as evidenced by patient and/or community resource care guide support experience decrease in ED visits as evidenced by EMR review.  ED visits in in last 6 months = 3  through collaboration with RN Care manager, provider, and care team.   Interventions: Evaluation of current treatment plan related to  self management and patient's adherence to plan as established by provider   Heart Failure Interventions:  (Status:  New goal. and Goal on track:  Yes.) Long Term Goal Provided education on low sodium diet Assessed need for readable accurate scales in home.  Provided education about placing scale on hard, flat surface Advised patient to weigh each morning after emptying bladder Discussed importance of daily weight and advised patient to weigh and record daily. Weighs daily, every morning. Today she is 252.5 lbs. Reviewed role of diuretics in prevention of fluid overload and management of heart failure. Patient denies any swelling. Discussed the importance of keeping all appointments with provider. Cardiology follow up on 07-12-2023 Screening for signs and symptoms of depression related to chronic disease state   Wt Readings from Last 3 Encounters:  03/14/23 255 lb (115.7 kg)  03/14/23 255 lb 3.2 oz (115.8  kg)  02/19/23 250 lb 1.8 oz (113.5 kg)  03-16-2023: 252.5 lb  Diabetes Interventions:  (Status:  New goal. and Goal on track:  Yes.) Long Term Goal Assessed patient's  understanding of A1c goal: <6.5% Provided education to patient about basic DM disease process. States she only checks her blood sugar once to twice per week. Follows her recommended diet. Blood sugar on 03-14-2023 was 91. States she ranges from 90s-100s. No hypo/hyperglycemia noted. Reviewed medications with patient and discussed importance of medication adherence Counseled on importance of regular laboratory monitoring as prescribed Discussed plans with patient for ongoing care management follow up and provided patient with direct contact information for care management team Lab Results  Component Value Date   HGBA1C 5.9 (H) 01/21/2023    Depression  (Status:  New goal. and Goal on track:  Yes.)  Long Term Goal Evaluation of current treatment plan related to Depression, Mental Health Concerns  self-management and patient's adherence to plan as established by provider. Discussed plans with patient for ongoing care management follow up and provided patient with direct contact information for care management team Provided education about importance of maintaining regular sleep scheduled and creating a restful environment Patient states she has ups and downs but overall she is doing well. She recently had a visit with the LCSW. RNCM set up call today for SW regarding assistance with water bill and meal delivery.   Hypertension Interventions:  (Status:  New goal. and Goal on track:  Yes.) Long Term Goal Last practice recorded BP readings:  BP Readings from Last 3 Encounters:  03/14/23 130/60  03/14/23 (!) 158/89  02/24/23 (!) 160/76  03-16-2023: 149/88 Most recent eGFR/CrCl:  Lab Results  Component Value Date   EGFR 72 03/14/2023    No components found for: "CRCL"  Evaluation of current treatment plan related to hypertension self management and patient's adherence to plan as established by provider. Checks BP daily in the morning and keeps log as recommended. Provided education to patient  re: stroke prevention, s/s of heart attack and stroke Reviewed medications with patient and discussed importance of compliance Counseled on adverse effects of illicit drug and excessive alcohol use in patients with high blood pressure  Discussed plans with patient for ongoing care management follow up and provided patient with direct contact information for care management team Discussed complications of poorly controlled blood pressure such as heart disease, stroke, circulatory complications, vision complications, kidney impairment, sexual dysfunction   Pneumonia  (Status:  Goal Met.)  Long Term Goal Evaluation of current treatment plan related to Pulmonary Disease, self-management and patient's adherence to plan as established by provider. Discussed plans with patient for ongoing care management follow up and provided patient with direct contact information for care management team Provided education to the patient about pneumonia, its causes, treatment options and prevention strategies (e.g. vaccinations) Education patient about continue use of incentive spirometer to promote lung expansion and to prevent atelectasis. Advise to complete all medications: prednisone taper Keep scheduled appointment  Patient Goals/Self-Care Activities: Take all medications as prescribed Attend all scheduled provider appointments Call pharmacy for medication refills 3-7 days in advance of running out of medications Attend church or other social activities Call provider office for new concerns or questions  call the Suicide and Crisis Lifeline: 988 call the [place] National Suicide Prevention Lifeline: 8547471090 or TTY: 236-348-8105 TTY 443-209-2191) to talk to a trained counselor call 1-800-273-TALK (toll free, 24 hour hotline) go to [facility] [address] [place] [phone]) call 911 if experiencing a Mental Health or Behavioral Health Crisis  identify and  remove indoor air pollutants do breathing exercises every day begin a symptom diary eliminate symptom triggers at home write blood pressure results in a log or diary call doctor for signs and symptoms of high blood pressure  Follow Up Plan:  Telephone follow up appointment with care management team member scheduled for:  04-15-2023 at 2:30 pm           Our next appointment is by telephone on 04-15-2023 at 2:30 pm  Please call the care guide team at [phone] if you need to cancel or reschedule your appointment.   If you are experiencing a Mental Health or Behavioral Health Crisis or need someone to talk to, please call the Suicide and Crisis Lifeline: 988 call the [place] National Suicide Prevention Lifeline: (772) 457-7612 or TTY: 8542088519 TTY (862) 681-4695) to talk to a trained counselor call 1-800-273-TALK (toll free, 24 hour hotline) go to [facility] [address], [place] ([phone]) call 911   Patient verbalizes understanding of instructions and care plan provided today and agrees to view in MyChart. Active MyChart status and patient understanding of how to access instructions and care plan via MyChart confirmed with patient.     Danise Edge, BSN RN RN Care Manager  [facility]  Direct Number: [phone]

## 2023-03-16 NOTE — Patient Outreach (Signed)
Care Management   Visit Note  03/16/2023 Name: [name] MRN: [id number] DOB: 1956/04/17  Subjective: [name] is a 67 y.o. year old female who is a primary care patient of Haywood Lasso, Shelton Silvas, FNP. The Care Management team was consulted for assistance.      Engaged with patient spoke with patient by telephone.    Goals Addressed             This Visit's Progress    RNCM Care Managment Expected Outcome: Monitor, Self-Manage and Reduced Symptoms of: CHF, Hypertension, DM, Depression       Current Barriers:  Knowledge Deficits related to plan of care for management of CHF, HTN, DMII, Depression, and Pulmonary Disease  Care Coordination needs related to Limited access to food and Lack of essential utilities - [employer]* Chronic Disease Management support and education needs related to CHF, HTN, DMII, Depression, and Pulmonary Disease  Lacks caregiver support Environmental consultant barriers  Patient states that she was recently treated for cellulitis of her lower leg and was given Amoxil 875 and Doxycycline. She states that her leg is doing much better at this time. At the time of the call, she noticed a scratch to her lower leg and RNCM advised that she cleanse the area well and monitor for any S/S infection.  Patient is also requesting her pulmonary visit be changed to [facility], RNCM to follow up to see if this change can be completed.  RNCM Clinical Goal(s):  Patient will verbalize basic understanding of  CHF, HTN, DMII, and Depression disease process and self health management plan as evidenced by verbal explanation, recognizing symptoms and when to seek help, lifestyle modifications and daily monitoring take all medications exactly as prescribed and will call provider for medication related questions as evidenced by compliance with all medications attend all scheduled medical appointments: with primary care provider and  specialist as evidenced by maintaining all scheduled appointments demonstrate Improved and Ongoing adherence to prescribed treatment plan for CHF, HTN, DMII, Depression, and Pulmonary Disease as evidenced by consistent medication compliance, symptom monitoring, continued lifestyle modifications, daily weights and continued self-management continue to work with RN Care Manager to address care management and care coordination needs related to  CHF, HTN, DMII, and Depression as evidenced by adherence to CM Team Scheduled appointments work with community resource care guide to address needs related to  Lack of essential utilities - electricity* as evidenced by patient and/or community resource care guide support experience decrease in ED visits as evidenced by EMR review.  ED visits in in last 6 months = 3  through collaboration with RN Care manager, provider, and care team.   Interventions: Evaluation of current treatment plan related to  self management and patient's adherence to plan as established by provider   Heart Failure Interventions:  (Status:  New goal. and Goal on track:  Yes.) Long Term Goal Provided education on low sodium diet Assessed need for readable accurate scales in home.  Provided education about placing scale on hard, flat surface Advised patient to weigh each morning after emptying bladder Discussed importance of daily weight and advised patient to weigh and record daily. Weighs daily, every morning. Today she is 252.5 lbs. Reviewed role of diuretics in prevention of fluid overload and management of heart failure. Patient denies any swelling. Discussed the importance of keeping all appointments with provider. Cardiology follow up on 07-12-2023 Screening for signs and symptoms of depression related to chronic disease state  Wt Readings from Last 3 Encounters:  03/14/23 255 lb (115.7 kg)  03/14/23 255 lb 3.2 oz (115.8 kg)  02/19/23 250 lb 1.8 oz (113.5 kg)  03-16-2023: 252.5  lb  Diabetes Interventions:  (Status:  New goal. and Goal on track:  Yes.) Long Term Goal Assessed patient's understanding of A1c goal: <6.5% Provided education to patient about basic DM disease process. States she only checks her blood sugar once to twice per week. Follows her recommended diet. Blood sugar on 03-14-2023 was 91. States she ranges from 90s-100s. No hypo/hyperglycemia noted. Reviewed medications with patient and discussed importance of medication adherence Counseled on importance of regular laboratory monitoring as prescribed Discussed plans with patient for ongoing care management follow up and provided patient with direct contact information for care management team Lab Results  Component Value Date   HGBA1C 5.9 (H) 01/21/2023    Depression  (Status:  New goal. and Goal on track:  Yes.)  Long Term Goal Evaluation of current treatment plan related to Depression, Mental Health Concerns  self-management and patient's adherence to plan as established by provider. Discussed plans with patient for ongoing care management follow up and provided patient with direct contact information for care management team Provided education about importance of maintaining regular sleep scheduled and creating a restful environment Patient states she has ups and downs but overall she is doing well. She recently had a visit with the LCSW. RNCM set up call today for SW regarding assistance with water bill and meal delivery.   Hypertension Interventions:  (Status:  New goal. and Goal on track:  Yes.) Long Term Goal Last practice recorded BP readings:  BP Readings from Last 3 Encounters:  03/14/23 130/60  03/14/23 (!) 158/89  02/24/23 (!) 160/76  03-16-2023: 149/88 Most recent eGFR/CrCl:  Lab Results  Component Value Date   EGFR 72 03/14/2023    No components found for: "CRCL"  Evaluation of current treatment plan related to hypertension self management and patient's adherence to plan as  established by provider. Checks BP daily in the morning and keeps log as recommended. Provided education to patient re: stroke prevention, s/s of heart attack and stroke Reviewed medications with patient and discussed importance of compliance Counseled on adverse effects of illicit drug and excessive alcohol use in patients with high blood pressure  Discussed plans with patient for ongoing care management follow up and provided patient with direct contact information for care management team Discussed complications of poorly controlled blood pressure such as heart disease, stroke, circulatory complications, vision complications, kidney impairment, sexual dysfunction   Pneumonia  (Status:  Goal Met.)  Long Term Goal Evaluation of current treatment plan related to Pulmonary Disease, self-management and patient's adherence to plan as established by provider. Discussed plans with patient for ongoing care management follow up and provided patient with direct contact information for care management team Provided education to the patient about pneumonia, its causes, treatment options and prevention strategies (e.g. vaccinations) Education patient about continue use of incentive spirometer to promote lung expansion and to prevent atelectasis. Advise to complete all medications: prednisone taper Keep scheduled appointment  Patient Goals/Self-Care Activities: Take all medications as prescribed Attend all scheduled provider appointments Call pharmacy for medication refills 3-7 days in advance of running out of medications Attend church or other social activities Call provider office for new concerns or questions  call the Suicide and Crisis Lifeline: 988 call the [place] National Suicide Prevention Lifeline: 956 769 0642 or TTY: 805-351-9228 TTY 979-295-4036) to talk to  a trained counselor call 1-800-273-TALK (toll free, 24 hour hotline) go to [facility] [address], [place] [phone]) call 911 if experiencing a Mental Health or Behavioral Health Crisis  identify and remove indoor air pollutants do breathing exercises every day begin a symptom diary eliminate symptom triggers at home write blood pressure results in a log or diary call doctor for signs and symptoms of high blood pressure  Follow Up Plan:  Telephone follow up appointment with care management team member scheduled for:  04-15-2023 at 2:30 pm            Consent to Services:  Patient was given information about care management services, agreed to services, and gave verbal consent to participate.   Plan: Telephone follow up appointment with care management team member scheduled for:04-15-2023 at 2:30 pm   Goals Addressed             This Visit's Progress    RNCM Care Managment Expected Outcome: Monitor, Self-Manage and Reduced Symptoms of: CHF, Hypertension, DM, Depression       Current Barriers:  Knowledge Deficits related to plan of care for management of CHF, HTN, DMII, Depression, and Pulmonary Disease  Care Coordination needs related to Limited access to food and Lack of essential utilities - [employer]* Chronic Disease Management support and education needs related to CHF, HTN, DMII, Depression, and Pulmonary Disease  Lacks caregiver support Environmental consultant barriers  Patient states that she was recently treated for cellulitis of her lower leg and was given Amoxil 875 and Doxycycline. She states that her leg is doing much better at this time. At the time of the call, she noticed a scratch to her lower leg and RNCM advised that she cleanse the area well and monitor for any S/S infection.  Patient is also requesting her pulmonary visit be changed to [facility], RNCM to follow up to see if this change can be completed.  RNCM Clinical Goal(s):  Patient will verbalize basic understanding of  CHF, HTN, DMII, and Depression disease  process and self health management plan as evidenced by verbal explanation, recognizing symptoms and when to seek help, lifestyle modifications and daily monitoring take all medications exactly as prescribed and will call provider for medication related questions as evidenced by compliance with all medications attend all scheduled medical appointments: with primary care provider and specialist as evidenced by maintaining all scheduled appointments demonstrate Improved and Ongoing adherence to prescribed treatment plan for CHF, HTN, DMII, Depression, and Pulmonary Disease as evidenced by consistent medication compliance, symptom monitoring, continued lifestyle modifications, daily weights and continued self-management continue to work with RN Care Manager to address care management and care coordination needs related to  CHF, HTN, DMII, and Depression as evidenced by adherence to CM Team Scheduled appointments work with community resource care guide to address needs related to  Lack of essential utilities - electricity* as evidenced by patient and/or community resource care guide support experience decrease in ED visits as evidenced by EMR review.  ED visits in in last 6 months = 3  through collaboration with RN Care manager, provider, and care team.   Interventions: Evaluation of current treatment plan related to  self management and patient's adherence to plan as established by provider   Heart Failure Interventions:  (Status:  New goal. and Goal on track:  Yes.) Long Term Goal Provided education on low sodium diet Assessed need for readable accurate scales in home.  Provided  education about placing scale on hard, flat surface Advised patient to weigh each morning after emptying bladder Discussed importance of daily weight and advised patient to weigh and record daily. Weighs daily, every morning. Today she is 252.5 lbs. Reviewed role of diuretics in prevention of fluid overload and management of  heart failure. Patient denies any swelling. Discussed the importance of keeping all appointments with provider. Cardiology follow up on 07-12-2023 Screening for signs and symptoms of depression related to chronic disease state   Wt Readings from Last 3 Encounters:  03/14/23 255 lb (115.7 kg)  03/14/23 255 lb 3.2 oz (115.8 kg)  02/19/23 250 lb 1.8 oz (113.5 kg)  03-16-2023: 252.5 lb  Diabetes Interventions:  (Status:  New goal. and Goal on track:  Yes.) Long Term Goal Assessed patient's understanding of A1c goal: <6.5% Provided education to patient about basic DM disease process. States she only checks her blood sugar once to twice per week. Follows her recommended diet. Blood sugar on 03-14-2023 was 91. States she ranges from 90s-100s. No hypo/hyperglycemia noted. Reviewed medications with patient and discussed importance of medication adherence Counseled on importance of regular laboratory monitoring as prescribed Discussed plans with patient for ongoing care management follow up and provided patient with direct contact information for care management team Lab Results  Component Value Date   HGBA1C 5.9 (H) 01/21/2023    Depression  (Status:  New goal. and Goal on track:  Yes.)  Long Term Goal Evaluation of current treatment plan related to Depression, Mental Health Concerns  self-management and patient's adherence to plan as established by provider. Discussed plans with patient for ongoing care management follow up and provided patient with direct contact information for care management team Provided education about importance of maintaining regular sleep scheduled and creating a restful environment Patient states she has ups and downs but overall she is doing well. She recently had a visit with the LCSW. RNCM set up call today for SW regarding assistance with water bill and meal delivery.   Hypertension Interventions:  (Status:  New goal. and Goal on track:  Yes.) Long Term Goal Last  practice recorded BP readings:  BP Readings from Last 3 Encounters:  03/14/23 130/60  03/14/23 (!) 158/89  02/24/23 (!) 160/76  03-16-2023: 149/88 Most recent eGFR/CrCl:  Lab Results  Component Value Date   EGFR 72 03/14/2023    No components found for: "CRCL"  Evaluation of current treatment plan related to hypertension self management and patient's adherence to plan as established by provider. Checks BP daily in the morning and keeps log as recommended. Provided education to patient re: stroke prevention, s/s of heart attack and stroke Reviewed medications with patient and discussed importance of compliance Counseled on adverse effects of illicit drug and excessive alcohol use in patients with high blood pressure  Discussed plans with patient for ongoing care management follow up and provided patient with direct contact information for care management team Discussed complications of poorly controlled blood pressure such as heart disease, stroke, circulatory complications, vision complications, kidney impairment, sexual dysfunction   Pneumonia  (Status:  Goal Met.)  Long Term Goal Evaluation of current treatment plan related to Pulmonary Disease, self-management and patient's adherence to plan as established by provider. Discussed plans with patient for ongoing care management follow up and provided patient with direct contact information for care management team Provided education to the patient about pneumonia, its causes, treatment options and prevention strategies (e.g. vaccinations) Education patient about continue use of incentive  spirometer to promote lung expansion and to prevent atelectasis. Advise to complete all medications: prednisone taper Keep scheduled appointment  Patient Goals/Self-Care Activities: Take all medications as prescribed Attend all scheduled provider appointments Call pharmacy for medication refills 3-7 days in advance of running out of medications Attend  church or other social activities Call provider office for new concerns or questions  call the Suicide and Crisis Lifeline: 988 call the [place] National Suicide Prevention Lifeline: 506-023-1870 or TTY: (765)767-2544 TTY 3041104845) to talk to a trained counselor call 1-800-273-TALK (toll free, 24 hour hotline) go to [facility] [address], [place] [phone]) call 911 if experiencing a Mental Health or Behavioral Health Crisis  identify and remove indoor air pollutants do breathing exercises every day begin a symptom diary eliminate symptom triggers at home write blood pressure results in a log or diary call doctor for signs and symptoms of high blood pressure  Follow Up Plan:  Telephone follow up appointment with care management team member scheduled for:  04-15-2023 at 2:30 pm

## 2023-03-16 NOTE — Patient Outreach (Signed)
  Care Coordination   Follow Up Visit Note   03/16/2023 Name: [name] MRN: [id number] DOB: [birth year]  [name] is a 67 y.o. year old female who sees Caudle, Shelton Silvas, FNP for primary care. I spoke with  [name] by phone today.  What matters to the patients health and wellness today?  Utility and Food Follow Up     Goals Addressed             This Visit's Progress    Care Coordination Activities- BSW Plan of Care       Care Coordination Interventions: Patient received a disconnection notice for the watehr bill and the electirc bill, SW called the [place] [employer] department with the patient on the phone, We spoke to a representative Jacquenette Shone) He stated that he would put a ticket in for someone form the billing department to call her in within 72 hours and since they are closed tomorrow and Friday for Thanksgiving then someone should call her by Monday. The patient provided her contact information and her water account number and explained that she was recently in the hospital and that she is currently on new medications and that she had a choice to get her medications or her bill and she stated that she got her medications. Horton Chin verified that there was not a disconnection scheduled as of today. SW Delton See provided the agent with her contact information and stated that she is the SW trying to advocate for the patient. Patient stated that she has reached out to her sister to see if she could assist her with the bill and she is just waiting to hear back form her. The patient stated that she also had an electric bill that was also on an arrangement and the SW advised her to call [employer] to ask for a new payment arrangement. SW is still searching a meal to be delivered for Thanksgiving.  SW has a scheduled appointment to follow up with the patient on 03/29/2023 at 11:30 am         SDOH assessments and interventions completed:  Yes  SDOH  Interventions Today    Flowsheet Row Most Recent Value  SDOH Interventions   Utilities Interventions --  [Patient received a disconnection notice for the watehr bill and the electirc bill]        Care Coordination Interventions:  Yes, provided  Interventions Today    Flowsheet Row Most Recent Value  General Interventions   General Interventions Discussed/Reviewed General Interventions Discussed, Science writer received a disconnection notice for the [employer] and the electirc bill]        Follow up plan: Follow up call scheduled for 03/29/2023 at 11:30 am    Encounter Outcome:  Patient Visit Completed   [name], PhD [facility], [facility] Social Worker Direct Dial: [phone]  Fax: [phone]

## 2023-03-16 NOTE — Patient Instructions (Signed)
Visit Information  Thank you for taking time to visit with me today. Please don't hesitate to contact me if I can be of assistance to you.   Following are the goals we discussed today:   Goals Addressed             This Visit's Progress    Care Coordination Activities- BSW Plan of Care       Care Coordination Interventions: Patient received a disconnection notice for the watehr bill and the electirc bill, SW called the [place] of [employer] department with the patient on the phone, We spoke to a representative Jacquenette Shone) He stated that he would put a ticket in for someone form the billing department to call her in within 72 hours and since they are closed tomorrow and Friday for Thanksgiving then someone should call her by Monday. The patient provided her contact information and her water account number and explained that she was recently in the hospital and that she is currently on new medications and that she had a choice to get her medications or her bill and she stated that she got her medications. Horton Chin verified that there was not a disconnection scheduled as of today. SW Delton See provided the agent with her contact information and stated that she is the SW trying to advocate for the patient. Patient stated that she has reached out to her sister to see if she could assist her with the bill and she is just waiting to hear back form her. The patient stated that she also had an electric bill that was also on an arrangement and the SW advised her to call [employer] to ask for a new payment arrangement. SW is still searching a meal to be delivered for Thanksgiving.  SW has a scheduled appointment to follow up with the patient on 03/29/2023 at 11:30 am         Our next appointment is by telephone on 03/29/2023 at 12/110/2024 at 11:30 am  Please call the care guide team at [phone] if you need to cancel or reschedule your appointment.   If you are experiencing a Mental Health or  Behavioral Health Crisis or need someone to talk to, please call the Suicide and Crisis Lifeline: 988 go to [facility] [address], [place] [phone]) call 911  Patient verbalizes understanding of instructions and care plan provided today and agrees to view in MyChart. Active MyChart status and patient understanding of how to access instructions and care plan via MyChart confirmed with patient.     [name], PhD [facility], [facility] Social Worker Direct Dial: [phone]  Fax: [phone]

## 2023-03-21 ENCOUNTER — Encounter (HOSPITAL_BASED_OUTPATIENT_CLINIC_OR_DEPARTMENT_OTHER): Payer: Self-pay | Admitting: Family Medicine

## 2023-03-21 ENCOUNTER — Ambulatory Visit (HOSPITAL_BASED_OUTPATIENT_CLINIC_OR_DEPARTMENT_OTHER): Payer: Medicare HMO | Admitting: Family Medicine

## 2023-03-21 VITALS — BP 129/64 | HR 81 | Temp 98.2°F | Ht 65.0 in | Wt 254.5 lb

## 2023-03-21 DIAGNOSIS — J019 Acute sinusitis, unspecified: Secondary | ICD-10-CM

## 2023-03-21 DIAGNOSIS — R2241 Localized swelling, mass and lump, right lower limb: Secondary | ICD-10-CM

## 2023-03-21 DIAGNOSIS — B9689 Other specified bacterial agents as the cause of diseases classified elsewhere: Secondary | ICD-10-CM | POA: Diagnosis not present

## 2023-03-21 DIAGNOSIS — L03115 Cellulitis of right lower limb: Secondary | ICD-10-CM | POA: Diagnosis not present

## 2023-03-21 DIAGNOSIS — R6889 Other general symptoms and signs: Secondary | ICD-10-CM

## 2023-03-21 DIAGNOSIS — R7303 Prediabetes: Secondary | ICD-10-CM | POA: Insufficient documentation

## 2023-03-21 LAB — POCT COVID BINAXNOW CARD: SARS Coronavirus 2 Ag: NEGATIVE

## 2023-03-21 LAB — POCT INFLUENZA A/B
Influenza A, POC: NEGATIVE
Influenza B, POC: NEGATIVE

## 2023-03-21 LAB — POCT RAPID STREP A (OFFICE): Rapid Strep A Screen: NEGATIVE

## 2023-03-21 MED ORDER — AMOXICILLIN 875 MG PO TABS
875.0000 mg | ORAL_TABLET | Freq: Two times a day (BID) | ORAL | 0 refills | Status: AC
Start: 2023-03-21 — End: 2023-03-28

## 2023-03-21 MED ORDER — FLUTICASONE PROPIONATE 50 MCG/ACT NA SUSP: 2.0000 | Freq: Every day | NASAL | 6 refills | Status: DC

## 2023-03-21 MED ORDER — ALBUTEROL SULFATE (2.5 MG/3ML) 0.083% IN NEBU: 2.5000 mg | INHALATION_SOLUTION | Freq: Four times a day (QID) | RESPIRATORY_TRACT | 1 refills | Status: DC | PRN

## 2023-03-21 MED ORDER — IPRATROPIUM BROMIDE 0.02 % IN SOLN: 0.5000 mg | Freq: Four times a day (QID) | RESPIRATORY_TRACT | 12 refills | Status: AC | PRN

## 2023-03-21 MED ORDER — DOXYCYCLINE HYCLATE 100 MG PO TABS: 100.0000 mg | ORAL_TABLET | Freq: Two times a day (BID) | ORAL | 0 refills | Status: DC
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
## 2023-03-21 NOTE — Patient Instructions (Signed)
Send a MyChart on Wednesday with update on your leg. If pain, redness, warmth is worsening, call the office.   Coricidin HBP- safe for high blood pressure and cold use    Rest, drink plenty of fluids.  Tylenol for fever, body aches.   For cough: Take Mucinex plain or Robitussin-DM OTC.  Follow the instructions in the box.  For nasal congestion: -Use over-the-counter Flonase: 2 nasal sprays on each side of the nose in the morning until you feel better  Avoid decongestants such as  Pseudoephedrine or phenylephrine   Take the antibiotic as prescribed    Call if not gradually better over the next  10 days  Call anytime if the symptoms are severe, you have high fever, short of breath, chest pain

## 2023-03-22 ENCOUNTER — Telehealth: Payer: Self-pay | Admitting: Gastroenterology

## 2023-03-22 DIAGNOSIS — D649 Anemia, unspecified: Secondary | ICD-10-CM | POA: Diagnosis not present

## 2023-03-22 DIAGNOSIS — E1142 Type 2 diabetes mellitus with diabetic polyneuropathy: Secondary | ICD-10-CM | POA: Diagnosis not present

## 2023-03-22 DIAGNOSIS — G4733 Obstructive sleep apnea (adult) (pediatric): Secondary | ICD-10-CM | POA: Diagnosis not present

## 2023-03-22 DIAGNOSIS — I5032 Chronic diastolic (congestive) heart failure: Secondary | ICD-10-CM | POA: Diagnosis not present

## 2023-03-22 DIAGNOSIS — K5730 Diverticulosis of large intestine without perforation or abscess without bleeding: Secondary | ICD-10-CM | POA: Diagnosis not present

## 2023-03-22 DIAGNOSIS — I951 Orthostatic hypotension: Secondary | ICD-10-CM | POA: Diagnosis not present

## 2023-03-22 DIAGNOSIS — U071 COVID-19: Secondary | ICD-10-CM | POA: Diagnosis not present

## 2023-03-22 DIAGNOSIS — J1282 Pneumonia due to coronavirus disease 2019: Secondary | ICD-10-CM | POA: Diagnosis not present

## 2023-03-22 DIAGNOSIS — I110 Hypertensive heart disease with heart failure: Secondary | ICD-10-CM | POA: Diagnosis not present

## 2023-03-22 NOTE — Telephone Encounter (Signed)
Good morning Dr. Meridee Score Supervising MD AM  The following patient is being referred to [place] for anemia. She has been a PT of [facility] and no longer wants to continue care since Dr. Kinnie Scales is not longer there. Her records are available with care everywhere in Epic. Please advise.

## 2023-03-22 NOTE — Telephone Encounter (Signed)
Patient currently has no evidence of anemia on most recent set of labs within the last 2 weeks. We can certainly become involved with her care with her prior doctor having retired. Recommend that she have iron/TIBC/ferritin/B12/folate sent, if there still remains concern for being evaluated for anemia. I cannot see her colonoscopy or recent EGD reports so those would need to be obtained and scanned into the chart for our evaluation and follow-up. In the interim she needs to follow-up with [facility] until a new patient visit is scheduled with myself or one of the APP's first available. If PCP finds that she has evidence of iron deficiency, please forward that information to [place] as we try to then further expedite evaluation. Thanks. GM

## 2023-03-23 ENCOUNTER — Ambulatory Visit: Payer: Medicare HMO | Admitting: Sports Medicine

## 2023-03-24 ENCOUNTER — Telehealth (HOSPITAL_BASED_OUTPATIENT_CLINIC_OR_DEPARTMENT_OTHER): Payer: Self-pay | Admitting: *Deleted

## 2023-03-24 DIAGNOSIS — I5032 Chronic diastolic (congestive) heart failure: Secondary | ICD-10-CM | POA: Diagnosis not present

## 2023-03-24 DIAGNOSIS — E1142 Type 2 diabetes mellitus with diabetic polyneuropathy: Secondary | ICD-10-CM | POA: Diagnosis not present

## 2023-03-24 DIAGNOSIS — K5730 Diverticulosis of large intestine without perforation or abscess without bleeding: Secondary | ICD-10-CM | POA: Diagnosis not present

## 2023-03-24 DIAGNOSIS — J1282 Pneumonia due to coronavirus disease 2019: Secondary | ICD-10-CM | POA: Diagnosis not present

## 2023-03-24 DIAGNOSIS — U071 COVID-19: Secondary | ICD-10-CM | POA: Diagnosis not present

## 2023-03-24 DIAGNOSIS — I951 Orthostatic hypotension: Secondary | ICD-10-CM | POA: Diagnosis not present

## 2023-03-24 DIAGNOSIS — G4733 Obstructive sleep apnea (adult) (pediatric): Secondary | ICD-10-CM | POA: Diagnosis not present

## 2023-03-24 DIAGNOSIS — D649 Anemia, unspecified: Secondary | ICD-10-CM | POA: Diagnosis not present

## 2023-03-24 DIAGNOSIS — I110 Hypertensive heart disease with heart failure: Secondary | ICD-10-CM | POA: Diagnosis not present

## 2023-03-24 NOTE — Telephone Encounter (Signed)
Copied from CRM [id number]. Topic: Clinical - Home Health Verbal Orders >> Mar 24, 2023  1:46 PM Avel Peace wrote: Caller/Agency: [facility] Callback Number: [phone] Service Requested: Skilled Nursing Frequency: 1 week 4  Any new concerns about the patient? No

## 2023-03-24 NOTE — Telephone Encounter (Signed)
[name], please advise on info from [place] if you are okay with this.

## 2023-03-28 ENCOUNTER — Ambulatory Visit (HOSPITAL_BASED_OUTPATIENT_CLINIC_OR_DEPARTMENT_OTHER): Payer: Medicare HMO | Admitting: Family Medicine

## 2023-03-28 DIAGNOSIS — Z1231 Encounter for screening mammogram for malignant neoplasm of breast: Secondary | ICD-10-CM

## 2023-03-29 ENCOUNTER — Ambulatory Visit: Payer: Self-pay | Admitting: Licensed Clinical Social Worker

## 2023-03-29 NOTE — Patient Instructions (Signed)
Visit Information  Thank you for taking time to visit with me today. Please don't hesitate to contact me if I can be of assistance to you.   Following are the goals we discussed today:   Goals Addressed             This Visit's Progress    Care Coordination Activities- BSW Plan of Care       Care Coordination Interventions: Patient stated that she was able to pay her water bill and now she is trying to find a way to pay her [employer] bill of $215.00, she stated that all the places want you to come in and she does not have reliable transportation and depends on family. Patient stated that she called [employer] to see if the arrangement could be extended and she was told no. Patient is going to reach out to family for help ans support. Sw is going to research some other resources in the community that can help if possible.  SW has a scheduled appointment to follow up with the patient on 04/18/2023 at 10:45 am         Our next appointment is by telephone on 04/18/2023 at 10:45 am  Please call the care guide team at [phone] if you need to cancel or reschedule your appointment.   If you are experiencing a Mental Health or Behavioral Health Crisis or need someone to talk to, please call the Suicide and Crisis Lifeline: 988 go to [facility] [address], [place] [phone]) call 911  Patient verbalizes understanding of instructions and care plan provided today and agrees to view in MyChart. Active MyChart status and patient understanding of how to access instructions and care plan via MyChart confirmed with patient.     [name], PhD [facility], [facility] Social Worker Direct Dial: [phone]  Fax: [phone]

## 2023-03-29 NOTE — Patient Outreach (Signed)
  Care Coordination   Follow Up Visit Note   03/29/2023 Name: [name] MRN: [id number] DOB: 22-Dec-1955  [name] is a 67 y.o. year old female who sees Caudle, Shelton Silvas, FNP for primary care. I spoke with  [name] by phone today.  What matters to the patients health and wellness today?  Utility bill assistance     Goals Addressed             This Visit's Progress    Care Coordination Activities- BSW Plan of Care       Care Coordination Interventions: Patient stated that she was able to pay her water bill and now she is trying to find a way to pay her [employer] bill of $215.00, she stated that all the places want you to come in and she does not have reliable transportation and depends on family. Patient stated that she called [employer] to see if the arrangement could be extended and she was told no. Patient is going to reach out to family for help ans support. Sw is going to research some other resources in the community that can help if possible.  SW has a scheduled appointment to follow up with the patient on 04/18/2023 at 10:45 am         SDOH assessments and interventions completed:  Yes  SDOH Interventions Today    Flowsheet Row Most Recent Value  SDOH Interventions   Food Insecurity Interventions Intervention Not Indicated  Housing Interventions Intervention Not Indicated  Utilities Interventions Other (Comment)  [Trying to find resources and will ask family to assist.]        Care Coordination Interventions:  Yes, provided  Interventions Today    Flowsheet Row Most Recent Value  General Interventions   General Interventions Discussed/Reviewed General Interventions Reviewed, [employer] with energy bill]        Follow up plan: Follow up call scheduled for 04/18/2023 at 10:45 am    Encounter Outcome:  Patient Visit Completed   [name], PhD [facility],  [facility] Social Worker Direct Dial: [phone]  Fax: [phone]

## 2023-03-30 ENCOUNTER — Telehealth (HOSPITAL_BASED_OUTPATIENT_CLINIC_OR_DEPARTMENT_OTHER): Payer: Self-pay | Admitting: *Deleted

## 2023-03-30 DIAGNOSIS — I951 Orthostatic hypotension: Secondary | ICD-10-CM | POA: Diagnosis not present

## 2023-03-30 DIAGNOSIS — K5730 Diverticulosis of large intestine without perforation or abscess without bleeding: Secondary | ICD-10-CM | POA: Diagnosis not present

## 2023-03-30 DIAGNOSIS — K8681 Exocrine pancreatic insufficiency: Secondary | ICD-10-CM | POA: Diagnosis not present

## 2023-03-30 DIAGNOSIS — I5032 Chronic diastolic (congestive) heart failure: Secondary | ICD-10-CM | POA: Diagnosis not present

## 2023-03-30 DIAGNOSIS — G4733 Obstructive sleep apnea (adult) (pediatric): Secondary | ICD-10-CM | POA: Diagnosis not present

## 2023-03-30 DIAGNOSIS — E1142 Type 2 diabetes mellitus with diabetic polyneuropathy: Secondary | ICD-10-CM | POA: Diagnosis not present

## 2023-03-30 DIAGNOSIS — D649 Anemia, unspecified: Secondary | ICD-10-CM | POA: Diagnosis not present

## 2023-03-30 DIAGNOSIS — J181 Lobar pneumonia, unspecified organism: Secondary | ICD-10-CM | POA: Diagnosis not present

## 2023-03-30 DIAGNOSIS — I110 Hypertensive heart disease with heart failure: Secondary | ICD-10-CM | POA: Diagnosis not present

## 2023-03-30 NOTE — Progress Notes (Unsigned)
[name] D.[facility] [address] [place] [address] Phone: [phone]   Assessment and Plan:     There are no diagnoses linked to this encounter.  ***   Pertinent previous records reviewed include ***    Follow Up: ***     Subjective:   I, [name], am serving as a Neurosurgeon for Doctor Richardean Sale  Chief Complaint: neck and shoulder pain   HPI:   03/31/2023 Patient is a 67 year old female with neck and shoulder pain. Patient states  Relevant Historical Information: ***  Additional pertinent review of systems negative.   Current Outpatient Medications:    albuterol (PROVENTIL) (2.5 MG/3ML) 0.083% nebulizer solution, Take 3 mLs (2.5 mg total) by nebulization every 6 (six) hours as needed for wheezing or shortness of breath., Disp: 100 mL, Rfl: 1   albuterol (VENTOLIN HFA) 108 (90 Base) MCG/ACT inhaler, INHALE 2 PUFFS INTO THE LUNGS EVERY 6 HOURS AS NEEDED FOR WHEEZING OR SHORTNESS OF BREATH, Disp: 3 each, Rfl: 2   amLODipine (NORVASC) 10 MG tablet, Take 1 tablet (10 mg total) by mouth daily., Disp: 90 tablet, Rfl: 3   Ascorbic Acid (VITAMIN C) 1000 MG tablet, Take 1,000 mg by mouth daily., Disp: , Rfl:    aspirin EC 81 MG tablet, Take 1 tablet (81 mg total) by mouth daily. Swallow whole., Disp: 30 tablet, Rfl: 12   benzonatate (TESSALON) 200 MG capsule, Take 1 capsule (200 mg total) by mouth 3 (three) times daily as needed for cough., Disp: 45 capsule, Rfl: 0   buPROPion (WELLBUTRIN XL) 300 MG 24 hr tablet, Take 1 tablet (300 mg total) by mouth daily., Disp: 90 tablet, Rfl: 3   chlorpheniramine-HYDROcodone (TUSSIONEX) 10-8 MG/5ML, Take 5 mLs by mouth every 12 (twelve) hours as needed for cough., Disp: 115 mL, Rfl: 0   doxycycline (VIBRA-TABS) 100 MG tablet, Take 1 tablet (100 mg total) by mouth 2 (two) times daily., Disp: 14 tablet, Rfl: 0   ferrous sulfate 325 (65 FE) MG tablet, Take 1 tablet (325 mg total) by mouth  daily with breakfast., Disp: 30 tablet, Rfl: 3   fluticasone (FLONASE) 50 MCG/ACT nasal spray, Place 2 sprays into both nostrils daily., Disp: 16 g, Rfl: 6   fluticasone-salmeterol (ADVAIR) 100-50 MCG/ACT AEPB, Inhale 1 puff into the lungs 2 (two) times daily., Disp: 60 each, Rfl: 1   gabapentin (NEURONTIN) 100 MG capsule, Take 2 capsules (200 mg total) by mouth 2 (two) times daily., Disp: , Rfl:    guaiFENesin (MUCINEX) 600 MG 12 hr tablet, Take 1 tablet (600 mg total) by mouth 2 (two) times daily., Disp: 30 tablet, Rfl: 0   ipratropium (ATROVENT) 0.02 % nebulizer solution, Take 2.5 mLs (0.5 mg total) by nebulization every 6 (six) hours as needed for wheezing or shortness of breath., Disp: 75 mL, Rfl: 12   ipratropium (ATROVENT) 0.03 % nasal spray, Place 2 sprays into both nostrils every 12 (twelve) hours. X 5-7 days (Patient taking differently: Place 2 sprays into both nostrils as needed for rhinitis.), Disp: 30 mL, Rfl: 0   lipase/protease/amylase (CREON) 36000 UNITS CPEP capsule, Take 36,000 Units by mouth 4 (four) times daily as needed (Stomach Pain)., Disp: , Rfl:    loperamide (IMODIUM) 2 MG capsule, Take 1 capsule (2 mg total) by mouth as needed for diarrhea or loose stools., Disp: 30 capsule, Rfl: 0   MAGNESIUM PO, Take 1 tablet by mouth daily., Disp: , Rfl:  Menthol, Topical Analgesic, (BIOFREEZE EX), Apply 1 Application topically as needed (Pain)., Disp: , Rfl:    montelukast (SINGULAIR) 10 MG tablet, Take 1 tablet (10 mg total) by mouth at bedtime., Disp: 90 tablet, Rfl: 3   Multiple Vitamin (MULTI-VITAMIN) tablet, Take 1 tablet by mouth daily., Disp: , Rfl:    ondansetron (ZOFRAN) 4 MG tablet, Take 1 tablet (4 mg total) by mouth every 8 (eight) hours as needed for nausea or vomiting., Disp: 30 tablet, Rfl: 0   oxyCODONE (OXY IR/ROXICODONE) 5 MG immediate release tablet, Take 1 tablet (5 mg total) by mouth every 6 (six) hours as needed for severe pain or moderate pain., Disp: 15 tablet,  Rfl: 0   pantoprazole (PROTONIX) 40 MG tablet, Take 1 tablet (40 mg total) by mouth daily., Disp: 30 tablet, Rfl: 3   PARoxetine (PAXIL) 20 MG tablet, Take 1 tablet (20 mg total) by mouth daily., Disp: 90 tablet, Rfl: 3   psyllium (HYDROCIL/METAMUCIL) 95 % PACK, Take 1 packet by mouth 2 (two) times daily. (Patient taking differently: Take 1 packet by mouth 2 (two) times daily as needed for mild constipation.), Disp: 240 each, Rfl:    torsemide (DEMADEX) 10 MG tablet, Take 1 tablet (10 mg total) by mouth daily., Disp: 90 tablet, Rfl: 3   traMADol (ULTRAM) 50 MG tablet, Take 1 tablet (50 mg total) by mouth 2 (two) times daily., Disp: , Rfl:    traZODone (DESYREL) 50 MG tablet, Take 1 tablet (50 mg total) by mouth at bedtime as needed for sleep., Disp: 90 tablet, Rfl: 1   VITAMIN D PO, Take 1 capsule by mouth daily., Disp: , Rfl:    Objective:     There were no vitals filed for this visit.    There is no height or weight on file to calculate BMI.    Physical Exam:    ***   Electronically signed by:  [name] D.[facility] 8:06 AM 03/30/23

## 2023-03-30 NOTE — Telephone Encounter (Signed)
Copied from CRM [id number]. Topic: Clinical - Home Health Verbal Orders >> Mar 24, 2023  1:06 PM Joanette Gula wrote: Caller/Agency: Wynona Canes w/ [facility].  Callback Number: [phone] Service Requested: Physical Therapy, Home health Nurse, & Home Health Aid, Medical Social Worker Frequency: 1 weekly for 8 weeks for PT, Nurse will be the same  Any new concerns about the patient? Yes SOB, High Fall Risk, Weakness   Per encounter from 12/5, [name] was fine with verbal being given. Called and spoke with Wynona Canes letting her know this and she verbalized understanding. Nothing further needed.

## 2023-03-31 ENCOUNTER — Ambulatory Visit (INDEPENDENT_AMBULATORY_CARE_PROVIDER_SITE_OTHER): Payer: Medicare HMO

## 2023-03-31 ENCOUNTER — Ambulatory Visit (INDEPENDENT_AMBULATORY_CARE_PROVIDER_SITE_OTHER): Payer: Medicare HMO | Admitting: Sports Medicine

## 2023-03-31 VITALS — HR 93 | Ht 65.0 in | Wt 254.0 lb

## 2023-03-31 DIAGNOSIS — G8929 Other chronic pain: Secondary | ICD-10-CM | POA: Diagnosis not present

## 2023-03-31 DIAGNOSIS — M5010 Cervical disc disorder with radiculopathy, unspecified cervical region: Secondary | ICD-10-CM | POA: Diagnosis not present

## 2023-03-31 DIAGNOSIS — M25511 Pain in right shoulder: Secondary | ICD-10-CM | POA: Diagnosis not present

## 2023-03-31 DIAGNOSIS — M1711 Unilateral primary osteoarthritis, right knee: Secondary | ICD-10-CM

## 2023-03-31 DIAGNOSIS — M25561 Pain in right knee: Secondary | ICD-10-CM | POA: Diagnosis not present

## 2023-03-31 DIAGNOSIS — M48062 Spinal stenosis, lumbar region with neurogenic claudication: Secondary | ICD-10-CM

## 2023-03-31 DIAGNOSIS — M19011 Primary osteoarthritis, right shoulder: Secondary | ICD-10-CM

## 2023-03-31 DIAGNOSIS — M542 Cervicalgia: Secondary | ICD-10-CM

## 2023-03-31 MED ORDER — METHYLPREDNISOLONE 4 MG PO TBPK: ORAL_TABLET | ORAL | 0 refills | Status: DC

## 2023-03-31 NOTE — Patient Instructions (Signed)
Prednisone dos pak  Tylenol 650 mg 3 times a day for pain relief  Knee and shoulder HEP  May use tramadol as needed for sever breakthrough pain  3 week follow up

## 2023-04-01 ENCOUNTER — Ambulatory Visit: Payer: Medicare HMO | Admitting: Licensed Clinical Social Worker

## 2023-04-02 DIAGNOSIS — I5032 Chronic diastolic (congestive) heart failure: Secondary | ICD-10-CM | POA: Diagnosis not present

## 2023-04-02 DIAGNOSIS — G4733 Obstructive sleep apnea (adult) (pediatric): Secondary | ICD-10-CM | POA: Diagnosis not present

## 2023-04-02 DIAGNOSIS — D649 Anemia, unspecified: Secondary | ICD-10-CM | POA: Diagnosis not present

## 2023-04-02 DIAGNOSIS — I951 Orthostatic hypotension: Secondary | ICD-10-CM | POA: Diagnosis not present

## 2023-04-02 DIAGNOSIS — K5730 Diverticulosis of large intestine without perforation or abscess without bleeding: Secondary | ICD-10-CM | POA: Diagnosis not present

## 2023-04-02 DIAGNOSIS — J181 Lobar pneumonia, unspecified organism: Secondary | ICD-10-CM | POA: Diagnosis not present

## 2023-04-02 DIAGNOSIS — I110 Hypertensive heart disease with heart failure: Secondary | ICD-10-CM | POA: Diagnosis not present

## 2023-04-02 DIAGNOSIS — E1142 Type 2 diabetes mellitus with diabetic polyneuropathy: Secondary | ICD-10-CM | POA: Diagnosis not present

## 2023-04-02 DIAGNOSIS — K8681 Exocrine pancreatic insufficiency: Secondary | ICD-10-CM | POA: Diagnosis not present

## 2023-04-04 ENCOUNTER — Encounter (HOSPITAL_BASED_OUTPATIENT_CLINIC_OR_DEPARTMENT_OTHER): Payer: Self-pay | Admitting: *Deleted

## 2023-04-04 ENCOUNTER — Encounter (HOSPITAL_BASED_OUTPATIENT_CLINIC_OR_DEPARTMENT_OTHER): Payer: Self-pay | Admitting: Family Medicine

## 2023-04-04 ENCOUNTER — Ambulatory Visit (HOSPITAL_BASED_OUTPATIENT_CLINIC_OR_DEPARTMENT_OTHER): Payer: Medicare HMO | Admitting: Family Medicine

## 2023-04-04 VITALS — BP 134/77 | HR 79 | Ht 65.0 in | Wt 254.4 lb

## 2023-04-04 DIAGNOSIS — Z130 Encounter for screening for diseases of the blood and blood-forming organs and certain disorders involving the immune mechanism: Secondary | ICD-10-CM | POA: Diagnosis not present

## 2023-04-04 DIAGNOSIS — L03115 Cellulitis of right lower limb: Secondary | ICD-10-CM

## 2023-04-04 DIAGNOSIS — E611 Iron deficiency: Secondary | ICD-10-CM | POA: Diagnosis not present

## 2023-04-04 DIAGNOSIS — H6990 Unspecified Eustachian tube disorder, unspecified ear: Secondary | ICD-10-CM

## 2023-04-04 DIAGNOSIS — I509 Heart failure, unspecified: Secondary | ICD-10-CM

## 2023-04-04 DIAGNOSIS — D649 Anemia, unspecified: Secondary | ICD-10-CM

## 2023-04-04 NOTE — Patient Instructions (Signed)
Please continue your Singulair as directed.  Use your Flonase twice daily, and/or nightly if irritation occurs for 2 weeks.
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
## 2023-04-05 ENCOUNTER — Encounter (HOSPITAL_BASED_OUTPATIENT_CLINIC_OR_DEPARTMENT_OTHER): Payer: Self-pay | Admitting: Family Medicine

## 2023-04-05 DIAGNOSIS — G8929 Other chronic pain: Secondary | ICD-10-CM | POA: Diagnosis not present

## 2023-04-05 DIAGNOSIS — M4803 Spinal stenosis, cervicothoracic region: Secondary | ICD-10-CM | POA: Diagnosis not present

## 2023-04-05 DIAGNOSIS — M25519 Pain in unspecified shoulder: Secondary | ICD-10-CM | POA: Diagnosis not present

## 2023-04-05 DIAGNOSIS — M1990 Unspecified osteoarthritis, unspecified site: Secondary | ICD-10-CM | POA: Diagnosis not present

## 2023-04-05 DIAGNOSIS — M4316 Spondylolisthesis, lumbar region: Secondary | ICD-10-CM | POA: Diagnosis not present

## 2023-04-05 DIAGNOSIS — M5412 Radiculopathy, cervical region: Secondary | ICD-10-CM | POA: Diagnosis not present

## 2023-04-05 LAB — BASIC METABOLIC PANEL
BUN/Creatinine Ratio: 18 (ref 12–28)
BUN: 21 mg/dL (ref 8–27)
CO2: 20 mmol/L (ref 20–29)
Calcium: 9.2 mg/dL (ref 8.7–10.3)
Chloride: 107 mmol/L — ABNORMAL HIGH (ref 96–106)
Creatinine, Ser: 1.2 mg/dL — ABNORMAL HIGH (ref 0.57–1.00)
Glucose: 103 mg/dL — ABNORMAL HIGH (ref 70–99)
Potassium: 4.2 mmol/L (ref 3.5–5.2)
Sodium: 143 mmol/L (ref 134–144)
eGFR: 49 mL/min/{1.73_m2} — ABNORMAL LOW (ref >59)

## 2023-04-05 LAB — B12 AND FOLATE PANEL
Folate: 10.2 ng/mL (ref >3.0)
Vitamin B-12: 1224 pg/mL (ref 232–1245)

## 2023-04-05 LAB — IRON,TIBC AND FERRITIN PANEL
Ferritin: 43 ng/mL (ref 15–150)
Iron Saturation: 9 % — CL (ref 15–55)
Iron: 39 ug/dL (ref 27–139)
Total Iron Binding Capacity: 433 ug/dL (ref 250–450)
UIBC: 394 ug/dL — ABNORMAL HIGH (ref 118–369)

## 2023-04-06 DIAGNOSIS — K8681 Exocrine pancreatic insufficiency: Secondary | ICD-10-CM | POA: Diagnosis not present

## 2023-04-06 DIAGNOSIS — D649 Anemia, unspecified: Secondary | ICD-10-CM | POA: Diagnosis not present

## 2023-04-06 DIAGNOSIS — I951 Orthostatic hypotension: Secondary | ICD-10-CM | POA: Diagnosis not present

## 2023-04-06 DIAGNOSIS — J181 Lobar pneumonia, unspecified organism: Secondary | ICD-10-CM | POA: Diagnosis not present

## 2023-04-06 DIAGNOSIS — I110 Hypertensive heart disease with heart failure: Secondary | ICD-10-CM | POA: Diagnosis not present

## 2023-04-06 DIAGNOSIS — E1142 Type 2 diabetes mellitus with diabetic polyneuropathy: Secondary | ICD-10-CM | POA: Diagnosis not present

## 2023-04-06 DIAGNOSIS — I5032 Chronic diastolic (congestive) heart failure: Secondary | ICD-10-CM | POA: Diagnosis not present

## 2023-04-06 DIAGNOSIS — G4733 Obstructive sleep apnea (adult) (pediatric): Secondary | ICD-10-CM | POA: Diagnosis not present

## 2023-04-06 DIAGNOSIS — K5730 Diverticulosis of large intestine without perforation or abscess without bleeding: Secondary | ICD-10-CM | POA: Diagnosis not present

## 2023-04-08 ENCOUNTER — Encounter (HOSPITAL_BASED_OUTPATIENT_CLINIC_OR_DEPARTMENT_OTHER): Payer: Self-pay | Admitting: Family Medicine

## 2023-04-08 ENCOUNTER — Telehealth: Payer: Self-pay

## 2023-04-08 DIAGNOSIS — I5032 Chronic diastolic (congestive) heart failure: Secondary | ICD-10-CM | POA: Diagnosis not present

## 2023-04-08 DIAGNOSIS — D649 Anemia, unspecified: Secondary | ICD-10-CM | POA: Diagnosis not present

## 2023-04-08 DIAGNOSIS — I951 Orthostatic hypotension: Secondary | ICD-10-CM | POA: Diagnosis not present

## 2023-04-08 DIAGNOSIS — E1142 Type 2 diabetes mellitus with diabetic polyneuropathy: Secondary | ICD-10-CM | POA: Diagnosis not present

## 2023-04-08 DIAGNOSIS — G4733 Obstructive sleep apnea (adult) (pediatric): Secondary | ICD-10-CM | POA: Diagnosis not present

## 2023-04-08 DIAGNOSIS — K8681 Exocrine pancreatic insufficiency: Secondary | ICD-10-CM | POA: Diagnosis not present

## 2023-04-08 DIAGNOSIS — I110 Hypertensive heart disease with heart failure: Secondary | ICD-10-CM | POA: Diagnosis not present

## 2023-04-08 DIAGNOSIS — J181 Lobar pneumonia, unspecified organism: Secondary | ICD-10-CM | POA: Diagnosis not present

## 2023-04-08 DIAGNOSIS — K5730 Diverticulosis of large intestine without perforation or abscess without bleeding: Secondary | ICD-10-CM | POA: Diagnosis not present

## 2023-04-08 DIAGNOSIS — R7989 Other specified abnormal findings of blood chemistry: Secondary | ICD-10-CM

## 2023-04-08 DIAGNOSIS — Z79899 Other long term (current) drug therapy: Secondary | ICD-10-CM

## 2023-04-08 NOTE — Telephone Encounter (Signed)
Spoke with pt. Pt was notified of lab results and recommendations. Pt will decrease Torsemide 10 mg every other day as directed. Pt will repeat lab work in 1 month. Lab order placed ([facility]).

## 2023-04-08 NOTE — Progress Notes (Signed)
[name], your iron levels are still low and have decreased since the past 2 months. I will notify GI and they should reach out to contact you regarding setting up a colonoscopy. Your b12 is normal. We will  continue to monitor your kidney function with taking your torsemide.

## 2023-04-09 NOTE — Telephone Encounter (Signed)
AOC, Thanks for reaching out. Her appointment with [facility] looks to be sooner than we likely would have an appointment. As she is already established there I think, she can remain there at this time. If other issues arise, reach back out to me and I'll forward again to our scheduling team. GM

## 2023-04-11 ENCOUNTER — Telehealth: Payer: Self-pay | Admitting: Sports Medicine

## 2023-04-11 DIAGNOSIS — D649 Anemia, unspecified: Secondary | ICD-10-CM | POA: Diagnosis not present

## 2023-04-11 DIAGNOSIS — K5730 Diverticulosis of large intestine without perforation or abscess without bleeding: Secondary | ICD-10-CM | POA: Diagnosis not present

## 2023-04-11 DIAGNOSIS — K8681 Exocrine pancreatic insufficiency: Secondary | ICD-10-CM | POA: Diagnosis not present

## 2023-04-11 DIAGNOSIS — G4733 Obstructive sleep apnea (adult) (pediatric): Secondary | ICD-10-CM | POA: Diagnosis not present

## 2023-04-11 DIAGNOSIS — I5032 Chronic diastolic (congestive) heart failure: Secondary | ICD-10-CM | POA: Diagnosis not present

## 2023-04-11 DIAGNOSIS — I110 Hypertensive heart disease with heart failure: Secondary | ICD-10-CM | POA: Diagnosis not present

## 2023-04-11 DIAGNOSIS — E1142 Type 2 diabetes mellitus with diabetic polyneuropathy: Secondary | ICD-10-CM | POA: Diagnosis not present

## 2023-04-11 DIAGNOSIS — J181 Lobar pneumonia, unspecified organism: Secondary | ICD-10-CM | POA: Diagnosis not present

## 2023-04-11 DIAGNOSIS — I951 Orthostatic hypotension: Secondary | ICD-10-CM | POA: Diagnosis not present

## 2023-04-11 NOTE — Telephone Encounter (Signed)
I received a voicemail after hours from [place] at [facility]. She said that Breasia finished the Prednisone and has been reporting moderate to severe pain. She has been using tramadol and heat as needed but is still having a lot pain. Please advise on other options.  Tresa Endo: [phone]

## 2023-04-12 DIAGNOSIS — G4733 Obstructive sleep apnea (adult) (pediatric): Secondary | ICD-10-CM | POA: Diagnosis not present

## 2023-04-12 DIAGNOSIS — K5730 Diverticulosis of large intestine without perforation or abscess without bleeding: Secondary | ICD-10-CM | POA: Diagnosis not present

## 2023-04-12 DIAGNOSIS — K8681 Exocrine pancreatic insufficiency: Secondary | ICD-10-CM | POA: Diagnosis not present

## 2023-04-12 DIAGNOSIS — D649 Anemia, unspecified: Secondary | ICD-10-CM | POA: Diagnosis not present

## 2023-04-12 DIAGNOSIS — J181 Lobar pneumonia, unspecified organism: Secondary | ICD-10-CM | POA: Diagnosis not present

## 2023-04-12 DIAGNOSIS — I5032 Chronic diastolic (congestive) heart failure: Secondary | ICD-10-CM | POA: Diagnosis not present

## 2023-04-12 DIAGNOSIS — I951 Orthostatic hypotension: Secondary | ICD-10-CM | POA: Diagnosis not present

## 2023-04-12 DIAGNOSIS — I110 Hypertensive heart disease with heart failure: Secondary | ICD-10-CM | POA: Diagnosis not present

## 2023-04-12 DIAGNOSIS — E1142 Type 2 diabetes mellitus with diabetic polyneuropathy: Secondary | ICD-10-CM | POA: Diagnosis not present

## 2023-04-12 NOTE — Telephone Encounter (Signed)
Called and left vm relaying Dr. Alden Hipp message

## 2023-04-14 ENCOUNTER — Encounter (HOSPITAL_BASED_OUTPATIENT_CLINIC_OR_DEPARTMENT_OTHER): Payer: Self-pay | Admitting: Family Medicine

## 2023-04-15 ENCOUNTER — Other Ambulatory Visit: Payer: Self-pay | Admitting: *Deleted

## 2023-04-15 ENCOUNTER — Ambulatory Visit (INDEPENDENT_AMBULATORY_CARE_PROVIDER_SITE_OTHER): Payer: Medicare HMO | Admitting: Licensed Clinical Social Worker

## 2023-04-15 DIAGNOSIS — F331 Major depressive disorder, recurrent, moderate: Secondary | ICD-10-CM

## 2023-04-15 NOTE — Progress Notes (Signed)
[facility] Counselor/Therapist Progress Note  Patient ID: [name], MRN: [id number]    Date: 04/15/23  Time Spent: 1002  am - 1047 am : 45 Minutes  Treatment Type: Individual Therapy.  Reported Symptoms: major Depressive Disorder, recurrent moderate  Mental Status Exam: Appearance:  Casual     Behavior: Appropriate  Motor: Normal  Speech/Language:  Clear and Coherent  Affect: Appropriate  Mood: normal  Thought process: normal  Thought content:   WNL  Sensory/Perceptual disturbances:   WNL  Orientation: oriented to person, place, time/date, situation, day of week, month of year, and year  Attention: Good  Concentration: Good  Memory: WNL  Fund of knowledge:  Good  Insight:   Good  Judgment:  Good  Impulse Control: Good   Risk Assessment: Danger to Self:  No Self-injurious Behavior: No Danger to Others: No Duty to Warn:no Physical Aggression / Violence:No  Access to Firearms a concern: No  Gang Involvement:No   Subjective:   [name] participated from home, via video, and consented to treatment. Therapist participated from home office. We met online due to patient request.  Subjective : "I am feeling so much better emotionally since I have recovered physically."  Objective: [name] presented at the session on-time. She was oriented to person, place, and time. Her eye contact and speech were appropriate. [name] readily engaged in conversation with the therapist, showing no signs of guardedness. Her mood and affect were congruent.  Assessment: [name]'s insight remains fair. She is gaining insight and understanding that small steps lead to big progress. She has shown significant improvement since our last session on 03/15/2023. Patient was severely ill with pneumonia and it impacted her overall outlook and progress both in her physical and mental health goals. [name] reports that she has continued to work on packing and  eliminating things in her home to prepare for her move. She reports that she is currently working with 2 different agencies to assist her in finding housing she can afford. [name] has voiced this move as a major concern for her future and how that it feels overwhelming. Currently she reports being more optimistic and not as stressed. [name] is gaining understanding about how her thoughts can influence her mental well-being. [name] continues to demonstrate motivation to improve her  Depression and reports that she feels if she had her own transportation it would help. Clinician and patient processed her buying another car, but patient states she wants to make sure she has the money for a home first. [name] continues to deny the presence of suicidal or homicidal ideation. Clinical intervention: Provided more psychoeducation about CBT techniques and the cognitive triangle. Clinician and patient discussed the relationship between  thoughts, feelings, and behaviors.   Progress: [name] continues to work toward progress on his first long-term goal to improve her depression and motivation. [name] has successfully implemented two behaviors in his routine at least twice a week by working around her home doing small things to assist in minimizing the stress of her move. Additionally,  [name] has demonstrated understanding of the cognitive triangle and how his thoughts,  behaviors, and emotions influence her overall well being.   Plan: [name] will continue with biweekly individual therapy sessions. Treatment plan to be reviewed by 10/22/2023.  Interventions: Cognitive Behavioral Therapy  Diagnosis: Major Depressive Disorder, recurrent episode, moderate    [name] MSW, LCSW/DATE 04/15/2023

## 2023-04-15 NOTE — Patient Instructions (Signed)
Visit Information  Thank you for taking time to visit with me today. Please don't hesitate to contact me if I can be of assistance to you before our next scheduled telephone appointment.  Following are the goals we discussed today:   Goals Addressed             This Visit's Progress    RNCM Care Managment Expected Outcome: Monitor, Self-Manage and Reduced Symptoms of: CHF, Hypertension, DM, Depression       Current Barriers:  Knowledge Deficits related to plan of care for management of CHF, HTN, DMII, Depression, and Pulmonary Disease  Care Coordination needs related to Limited access to food and Lack of essential utilities - [employer]* Chronic Disease Management support and education needs related to CHF, HTN, DMII, Depression, and Pulmonary Disease  Lacks caregiver support Environmental consultant barriers    RNCM Clinical Goal(s):  Patient will verbalize basic understanding of  CHF, HTN, DMII, and Depression disease process and self health management plan as evidenced by verbal explanation, recognizing symptoms and when to seek help, lifestyle modifications and daily monitoring take all medications exactly as prescribed and will call provider for medication related questions as evidenced by compliance with all medications attend all scheduled medical appointments: with primary care provider and specialist as evidenced by maintaining all scheduled appointments demonstrate Improved and Ongoing adherence to prescribed treatment plan for CHF, HTN, DMII, Depression, and Pulmonary Disease as evidenced by consistent medication compliance, symptom monitoring, continued lifestyle modifications, daily weights and continued self-management continue to work with RN Care Manager to address care management and care coordination needs related to  CHF, HTN, DMII, and Depression as evidenced by adherence to CM Team Scheduled appointments work with community resource care guide to  address needs related to  Lack of essential utilities - electricity* as evidenced by patient and/or community resource care guide support experience decrease in ED visits as evidenced by EMR review.  ED visits in in last 6 months = 3  through collaboration with RN Care manager, provider, and care team.   Interventions: Evaluation of current treatment plan related to  self management and patient's adherence to plan as established by provider   Heart Failure Interventions:  (Status:  Goal on track:  Yes.) Long Term Goal Provided education on low sodium diet Assessed need for readable accurate scales in home.  Provided education about placing scale on hard, flat surface Advised patient to weigh each morning after emptying bladder Discussed importance of daily weight and advised patient to weigh and record daily. Weighs daily, every morning. Today she is 252 lbs. Reviewed role of diuretics in prevention of fluid overload and management of heart failure. Patient denies any swelling. Discussed the importance of keeping all appointments with provider. Cardiology follow up on 07-12-2023 Screening for signs and symptoms of depression related to chronic disease state   Wt Readings from Last 3 Encounters:  04/04/23 254 lb 6.4 oz (115.4 kg)  03/31/23 254 lb (115.2 kg)  03/21/23 254 lb 8 oz (115.4 kg)  04-15-2023: 252 lb  Diabetes Interventions:  (Status:  Goal on track:  Yes.) Long Term Goal Assessed patient's understanding of A1c goal: <6.5% Provided education to patient about basic DM disease process. No hypo/hyperglycemia noted. No reports of recent readings. States she has been busy and has not checked her blood sugar recently. Reviewed medications with patient and discussed importance of medication adherence Counseled on importance of regular laboratory monitoring as prescribed Discussed plans with patient for  ongoing care management follow up and provided patient with direct contact information  for care management team Lab Results  Component Value Date   HGBA1C 5.9 (H) 01/21/2023    Depression  (Status:  Goal on track:  Yes.)  Long Term Goal Evaluation of current treatment plan related to Depression, Mental Health Concerns  self-management and patient's adherence to plan as established by provider. Discussed plans with patient for ongoing care management follow up and provided patient with direct contact information for care management team Provided education about importance of maintaining regular sleep scheduled and creating a restful environment Patient had appointment with counselor today. Patient noted to not be as upbeat during call today but stated she was doing ok.    Hypertension Interventions:  (Status:  Goal on track:  Yes.) Long Term Goal Last practice recorded BP readings:  BP Readings from Last 3 Encounters:  04/04/23 134/77  03/21/23 129/64  03/14/23 130/60  03-16-2023: 149/88 Most recent eGFR/CrCl:  Lab Results  Component Value Date   EGFR 49 (L) 04/04/2023    No components found for: "CRCL"  Evaluation of current treatment plan related to hypertension self management and patient's adherence to plan as established by provider. BP checks have been performed by home health recently and states when her nurse visited on Tuesday her blood pressure was elevated, 180/70 but states she was having some pain at that time. Patient has not rechecked her blood pressure and recorded the findings since her last home health visit.  Provided education to patient re: stroke prevention, s/s of heart attack and stroke Reviewed medications with patient and discussed importance of compliance Counseled on adverse effects of illicit drug and excessive alcohol use in patients with high blood pressure  Discussed plans with patient for ongoing care management follow up and provided patient with direct contact information for care management team Discussed complications of poorly  controlled blood pressure such as heart disease, stroke, circulatory complications, vision complications, kidney impairment, sexual dysfunction   Patient Goals/Self-Care Activities: Take all medications as prescribed Attend all scheduled provider appointments Call pharmacy for medication refills 3-7 days in advance of running out of medications Attend church or other social activities Call provider office for new concerns or questions  call the Suicide and Crisis Lifeline: 988 call the [place] National Suicide Prevention Lifeline: 281 225 1162 or TTY: 779-773-7362 TTY 618-089-4622) to talk to a trained counselor call 1-800-273-TALK (toll free, 24 hour hotline) go to [facility] [address] [place] [phone]) call 911 if experiencing a Mental Health or Behavioral Health Crisis  identify and remove indoor air pollutants do breathing exercises every day begin a symptom diary eliminate symptom triggers at home write blood pressure results in a log or diary call doctor for signs and symptoms of high blood pressure  Follow Up Plan:  Telephone follow up appointment with care management team member scheduled for:  05-18-2023 at 2:30 pm           Our next appointment is by telephone on 05-18-2023 at 2:30 pm  Please call the care guide team at [phone] if you need to cancel or reschedule your appointment.   If you are experiencing a Mental Health or Behavioral Health Crisis or need someone to talk to, please call the Suicide and Crisis Lifeline: 988 call the [place] National Suicide Prevention Lifeline: 670 433 8183 or TTY: (703) 080-5209 TTY 318-698-5152) to talk to a trained counselor call 1-800-273-TALK (toll free, 24 hour hotline) go to [facility] Urgent [facility] [address] [place] [phone])   Patient verbalizes understanding of instructions and care plan provided today and agrees to view in MyChart. Active  MyChart status and patient understanding of how to access instructions and care plan via MyChart confirmed with patient.     Telephone follow up appointment with care management team member scheduled for:05-18-2023 at 2:30 pm  Danise Edge, BSN RN RN Care Manager  [facility]  Ambulatory Care Management  Direct Number: [phone]

## 2023-04-15 NOTE — Patient Outreach (Signed)
Care Management   Visit Note  04/15/2023 Name: [name] MRN: [id number] DOB: [birth year]  Subjective: [name] is a 67 y.o. year old female who is a primary care patient of Haywood Lasso, Shelton Silvas, FNP. The Care Management team was consulted for assistance.      Engaged with patient spoke with patient by telephone.    Goals Addressed             This Visit's Progress    RNCM Care Managment Expected Outcome: Monitor, Self-Manage and Reduced Symptoms of: CHF, Hypertension, DM, Depression       Current Barriers:  Knowledge Deficits related to plan of care for management of CHF, HTN, DMII, Depression, and Pulmonary Disease  Care Coordination needs related to Limited access to food and Lack of essential utilities - [employer]* Chronic Disease Management support and education needs related to CHF, HTN, DMII, Depression, and Pulmonary Disease  Lacks caregiver support Environmental consultant barriers    RNCM Clinical Goal(s):  Patient will verbalize basic understanding of  CHF, HTN, DMII, and Depression disease process and self health management plan as evidenced by verbal explanation, recognizing symptoms and when to seek help, lifestyle modifications and daily monitoring take all medications exactly as prescribed and will call provider for medication related questions as evidenced by compliance with all medications attend all scheduled medical appointments: with primary care provider and specialist as evidenced by maintaining all scheduled appointments demonstrate Improved and Ongoing adherence to prescribed treatment plan for CHF, HTN, DMII, Depression, and Pulmonary Disease as evidenced by consistent medication compliance, symptom monitoring, continued lifestyle modifications, daily weights and continued self-management continue to work with RN Care Manager to address care management and care coordination needs related to  CHF, HTN, DMII, and  Depression as evidenced by adherence to CM Team Scheduled appointments work with community resource care guide to address needs related to  Lack of essential utilities - electricity* as evidenced by patient and/or community resource care guide support experience decrease in ED visits as evidenced by EMR review.  ED visits in in last 6 months = 3  through collaboration with RN Care manager, provider, and care team.   Interventions: Evaluation of current treatment plan related to  self management and patient's adherence to plan as established by provider   Heart Failure Interventions:  (Status:  Goal on track:  Yes.) Long Term Goal Provided education on low sodium diet Assessed need for readable accurate scales in home.  Provided education about placing scale on hard, flat surface Advised patient to weigh each morning after emptying bladder Discussed importance of daily weight and advised patient to weigh and record daily. Weighs daily, every morning. Today she is 252 lbs. Reviewed role of diuretics in prevention of fluid overload and management of heart failure. Patient denies any swelling. Discussed the importance of keeping all appointments with provider. Cardiology follow up on 07-12-2023 Screening for signs and symptoms of depression related to chronic disease state   Wt Readings from Last 3 Encounters:  04/04/23 254 lb 6.4 oz (115.4 kg)  03/31/23 254 lb (115.2 kg)  03/21/23 254 lb 8 oz (115.4 kg)  04-15-2023: 252 lb  Diabetes Interventions:  (Status:  Goal on track:  Yes.) Long Term Goal Assessed patient's understanding of A1c goal: <6.5% Provided education to patient about basic DM disease process. No hypo/hyperglycemia noted. No reports of recent readings. States she has been busy and has not checked her blood sugar recently. Reviewed medications with patient  and discussed importance of medication adherence Counseled on importance of regular laboratory monitoring as  prescribed Discussed plans with patient for ongoing care management follow up and provided patient with direct contact information for care management team Lab Results  Component Value Date   HGBA1C 5.9 (H) 01/21/2023    Depression  (Status:  Goal on track:  Yes.)  Long Term Goal Evaluation of current treatment plan related to Depression, Mental Health Concerns  self-management and patient's adherence to plan as established by provider. Discussed plans with patient for ongoing care management follow up and provided patient with direct contact information for care management team Provided education about importance of maintaining regular sleep scheduled and creating a restful environment Patient had appointment with counselor today. Patient noted to not be as upbeat during call today but stated she was doing ok.    Hypertension Interventions:  (Status:  Goal on track:  Yes.) Long Term Goal Last practice recorded BP readings:  BP Readings from Last 3 Encounters:  04/04/23 134/77  03/21/23 129/64  03/14/23 130/60  03-16-2023: 149/88 Most recent eGFR/CrCl:  Lab Results  Component Value Date   EGFR 49 (L) 04/04/2023    No components found for: "CRCL"  Evaluation of current treatment plan related to hypertension self management and patient's adherence to plan as established by provider. BP checks have been performed by home health recently and states when her nurse visited on Tuesday her blood pressure was elevated, 180/70 but states she was having some pain at that time. Patient has not rechecked her blood pressure and recorded the findings since her last home health visit.  Provided education to patient re: stroke prevention, s/s of heart attack and stroke Reviewed medications with patient and discussed importance of compliance Counseled on adverse effects of illicit drug and excessive alcohol use in patients with high blood pressure  Discussed plans with patient for ongoing care  management follow up and provided patient with direct contact information for care management team Discussed complications of poorly controlled blood pressure such as heart disease, stroke, circulatory complications, vision complications, kidney impairment, sexual dysfunction   Patient Goals/Self-Care Activities: Take all medications as prescribed Attend all scheduled provider appointments Call pharmacy for medication refills 3-7 days in advance of running out of medications Attend church or other social activities Call provider office for new concerns or questions  call the Suicide and Crisis Lifeline: 988 call the [place] National Suicide Prevention Lifeline: 518-358-3806 or TTY: (913)372-2091 TTY 207 493 8761) to talk to a trained counselor call 1-800-273-TALK (toll free, 24 hour hotline) go to [facility]gent [facility] [address], [place] [phone]) call 911 if experiencing a Mental Health or Behavioral Health Crisis  identify and remove indoor air pollutants do breathing exercises every day begin a symptom diary eliminate symptom triggers at home write blood pressure results in a log or diary call doctor for signs and symptoms of high blood pressure  Follow Up Plan:  Telephone follow up appointment with care management team member scheduled for:  05-18-2023 at 2:30 pm           Consent to Services:  Patient was given information about care management services, agreed to services, and gave verbal consent to participate.   Plan: Telephone follow up appointment with care management team member scheduled for:05-18-2023 at 2:30 pm  Danise Edge, BSN RN RN Care Manager  [facility]  Ambulatory Care Management  Direct Number: [phone]

## 2023-04-18 ENCOUNTER — Ambulatory Visit: Payer: Self-pay | Admitting: Licensed Clinical Social Worker

## 2023-04-18 DIAGNOSIS — K8681 Exocrine pancreatic insufficiency: Secondary | ICD-10-CM | POA: Diagnosis not present

## 2023-04-18 DIAGNOSIS — I951 Orthostatic hypotension: Secondary | ICD-10-CM | POA: Diagnosis not present

## 2023-04-18 DIAGNOSIS — E1142 Type 2 diabetes mellitus with diabetic polyneuropathy: Secondary | ICD-10-CM | POA: Diagnosis not present

## 2023-04-18 DIAGNOSIS — G4733 Obstructive sleep apnea (adult) (pediatric): Secondary | ICD-10-CM | POA: Diagnosis not present

## 2023-04-18 DIAGNOSIS — I110 Hypertensive heart disease with heart failure: Secondary | ICD-10-CM | POA: Diagnosis not present

## 2023-04-18 DIAGNOSIS — I5032 Chronic diastolic (congestive) heart failure: Secondary | ICD-10-CM | POA: Diagnosis not present

## 2023-04-18 DIAGNOSIS — J181 Lobar pneumonia, unspecified organism: Secondary | ICD-10-CM | POA: Diagnosis not present

## 2023-04-18 DIAGNOSIS — K5730 Diverticulosis of large intestine without perforation or abscess without bleeding: Secondary | ICD-10-CM | POA: Diagnosis not present

## 2023-04-18 DIAGNOSIS — D649 Anemia, unspecified: Secondary | ICD-10-CM | POA: Diagnosis not present

## 2023-04-18 NOTE — Patient Instructions (Signed)
Visit Information  Thank you for taking time to visit with me today. Please don't hesitate to contact me if I can be of assistance to you.   Following are the goals we discussed today:   Goals Addressed             This Visit's Progress    Care Coordination Activities- BSW Plan of Care       Care Coordination Interventions: Patient stated that she was able to get her utility bill paid and that is no longer a concern. Patient was not feeling well today she stated that she was congested and stopped up, Sw offered to have the nurse to call her and the patient declined.  Patient stated that he sister provided transportation to her medical appointments and if something changes she will let the SW know. The Sw will follow up on this and offer other resources for the future if the patient wants it. SW has a scheduled appointment to follow up with the patient on 05/05/2023 at 10:45 am         Our next appointment is by telephone on 05/05/2023 at 10:45 am  Please call the care guide team at [phone] if you need to cancel or reschedule your appointment.   If you are experiencing a Mental Health or Behavioral Health Crisis or need someone to talk to, please call the Suicide and Crisis Lifeline: 988 go to [facility] Urgent [facility] [address], [place] [phone]) call 911  Patient verbalizes understanding of instructions and care plan provided today and agrees to view in MyChart. Active MyChart status and patient understanding of how to access instructions and care plan via MyChart confirmed with patient.     [name], PhD [facility], [facility] Social Worker Direct Dial: [phone]  Fax: [phone]

## 2023-04-18 NOTE — Patient Outreach (Signed)
  Care Coordination   Follow Up Visit Note   04/18/2023 Name: [name] MRN: [id number] DOB: 1956/03/20  CHRISTIAN HETZER is a 67 y.o. year old female who sees Caudle, Shelton Silvas, FNP for primary care. I spoke with  [name] by phone today.  What matters to the patients health and wellness today?  Utility and Transportation     Goals Addressed             This Visit's Progress    Care Coordination Activities- BSW Plan of Care       Care Coordination Interventions: Patient stated that she was able to get her utility bill paid and that is no longer a concern. Patient was not feeling well today she stated that she was congested and stopped up, Sw offered to have the nurse to call her and the patient declined.  Patient stated that he sister provided transportation to her medical appointments and if something changes she will let the SW know. The Sw will follow up on this and offer other resources for the future if the patient wants it. SW has a scheduled appointment to follow up with the patient on 05/05/2023 at 10:45 am         SDOH assessments and interventions completed:  Yes  SDOH Interventions Today    Flowsheet Row Most Recent Value  SDOH Interventions   Food Insecurity Interventions Intervention Not Indicated  Housing Interventions Intervention Not Indicated  Transportation Interventions Intervention Not Indicated  [Patient stated that her sister takes her to her appointments]  Utilities Interventions Intervention Not Indicated        Care Coordination Interventions:  Yes, provided  Interventions Today    Flowsheet Row Most Recent Value  General Interventions   General Interventions Discussed/Reviewed General Interventions Reviewed, [employer] stated that her sister takes her to her appointments, SW will follow up to see if other resources need to be added.]        Follow up plan: Follow up call scheduled for  05/05/2023 at 10:45 am    Encounter Outcome:  Patient Visit Completed   [name], PhD [facility], [facility] Social Worker Direct Dial: [phone]  Fax: [phone]

## 2023-04-22 DIAGNOSIS — G4733 Obstructive sleep apnea (adult) (pediatric): Secondary | ICD-10-CM | POA: Diagnosis not present

## 2023-04-22 DIAGNOSIS — I5032 Chronic diastolic (congestive) heart failure: Secondary | ICD-10-CM | POA: Diagnosis not present

## 2023-04-22 DIAGNOSIS — K8681 Exocrine pancreatic insufficiency: Secondary | ICD-10-CM | POA: Diagnosis not present

## 2023-04-22 DIAGNOSIS — E1142 Type 2 diabetes mellitus with diabetic polyneuropathy: Secondary | ICD-10-CM | POA: Diagnosis not present

## 2023-04-22 DIAGNOSIS — D649 Anemia, unspecified: Secondary | ICD-10-CM | POA: Diagnosis not present

## 2023-04-22 DIAGNOSIS — I110 Hypertensive heart disease with heart failure: Secondary | ICD-10-CM | POA: Diagnosis not present

## 2023-04-22 DIAGNOSIS — I951 Orthostatic hypotension: Secondary | ICD-10-CM | POA: Diagnosis not present

## 2023-04-22 DIAGNOSIS — J181 Lobar pneumonia, unspecified organism: Secondary | ICD-10-CM | POA: Diagnosis not present

## 2023-04-22 DIAGNOSIS — K5730 Diverticulosis of large intestine without perforation or abscess without bleeding: Secondary | ICD-10-CM | POA: Diagnosis not present

## 2023-04-27 DIAGNOSIS — K219 Gastro-esophageal reflux disease without esophagitis: Secondary | ICD-10-CM | POA: Diagnosis not present

## 2023-04-27 DIAGNOSIS — G4733 Obstructive sleep apnea (adult) (pediatric): Secondary | ICD-10-CM | POA: Diagnosis not present

## 2023-04-27 DIAGNOSIS — E1142 Type 2 diabetes mellitus with diabetic polyneuropathy: Secondary | ICD-10-CM | POA: Diagnosis not present

## 2023-04-27 DIAGNOSIS — J181 Lobar pneumonia, unspecified organism: Secondary | ICD-10-CM | POA: Diagnosis not present

## 2023-04-27 DIAGNOSIS — K8681 Exocrine pancreatic insufficiency: Secondary | ICD-10-CM | POA: Diagnosis not present

## 2023-04-27 DIAGNOSIS — I5032 Chronic diastolic (congestive) heart failure: Secondary | ICD-10-CM | POA: Diagnosis not present

## 2023-04-27 DIAGNOSIS — R1032 Left lower quadrant pain: Secondary | ICD-10-CM | POA: Diagnosis not present

## 2023-04-27 DIAGNOSIS — I951 Orthostatic hypotension: Secondary | ICD-10-CM | POA: Diagnosis not present

## 2023-04-27 DIAGNOSIS — E611 Iron deficiency: Secondary | ICD-10-CM | POA: Insufficient documentation

## 2023-04-27 DIAGNOSIS — I110 Hypertensive heart disease with heart failure: Secondary | ICD-10-CM | POA: Diagnosis not present

## 2023-04-27 DIAGNOSIS — K5730 Diverticulosis of large intestine without perforation or abscess without bleeding: Secondary | ICD-10-CM | POA: Diagnosis not present

## 2023-04-27 DIAGNOSIS — D649 Anemia, unspecified: Secondary | ICD-10-CM | POA: Diagnosis not present

## 2023-04-27 NOTE — Progress Notes (Deleted)
 Ben Jackson D.[facility] [address] [place] [address] Phone: [phone]   Assessment and Plan:     There are no diagnoses linked to this encounter.  ***   Pertinent previous records reviewed include ***    Follow Up: ***     Subjective:   I, Addilynn Mowrer, am serving as a neurosurgeon for Doctor Morene Mace   Chief Complaint: neck and shoulder pain    HPI:    03/31/2023 Patient is a 68 year old female with neck and shoulder pain. Patient states right shoulder down to her elbow for a few weeks, intermittently pain was really intense Wednesday. Right knee is in pain as well. Pain for a few days now. Meloxicam  does not help with the pain. No MOI. Pain with all movement even at rest. Decrease ROM    04/28/2023 Patient states  Relevant Historical Information: Hypertension, fibromyalgia  Additional pertinent review of systems negative.   Current Outpatient Medications:    albuterol  (PROVENTIL ) (2.5 MG/3ML) 0.083% nebulizer solution, Take 3 mLs (2.5 mg total) by nebulization every 6 (six) hours as needed for wheezing or shortness of breath., Disp: 100 mL, Rfl: 1   albuterol  (VENTOLIN  HFA) 108 (90 Base) MCG/ACT inhaler, INHALE 2 PUFFS INTO THE LUNGS EVERY 6 HOURS AS NEEDED FOR WHEEZING OR SHORTNESS OF BREATH, Disp: 3 each, Rfl: 2   amLODipine  (NORVASC ) 10 MG tablet, Take 1 tablet (10 mg total) by mouth daily., Disp: 90 tablet, Rfl: 3   Ascorbic Acid  (VITAMIN C ) 1000 MG tablet, Take 1,000 mg by mouth daily., Disp: , Rfl:    aspirin  EC 81 MG tablet, Take 1 tablet (81 mg total) by mouth daily. Swallow whole., Disp: 30 tablet, Rfl: 12   benzonatate  (TESSALON ) 200 MG capsule, Take 1 capsule (200 mg total) by mouth 3 (three) times daily as needed for cough., Disp: 45 capsule, Rfl: 0   buPROPion  (WELLBUTRIN  XL) 300 MG 24 hr tablet, Take 1 tablet (300 mg total) by mouth daily., Disp: 90 tablet, Rfl: 3   ferrous sulfate  325 (65 FE) MG tablet,  Take 1 tablet (325 mg total) by mouth daily with breakfast., Disp: 30 tablet, Rfl: 3   fluticasone  (FLONASE ) 50 MCG/ACT nasal spray, Place 2 sprays into both nostrils daily., Disp: 16 g, Rfl: 6   fluticasone -salmeterol (ADVAIR) 100-50 MCG/ACT AEPB, Inhale 1 puff into the lungs 2 (two) times daily., Disp: 60 each, Rfl: 1   gabapentin  (NEURONTIN ) 100 MG capsule, Take 2 capsules (200 mg total) by mouth 2 (two) times daily., Disp: , Rfl:    ipratropium (ATROVENT ) 0.02 % nebulizer solution, Take 2.5 mLs (0.5 mg total) by nebulization every 6 (six) hours as needed for wheezing or shortness of breath., Disp: 75 mL, Rfl: 12   ipratropium (ATROVENT ) 0.03 % nasal spray, Place 2 sprays into both nostrils every 12 (twelve) hours. X 5-7 days (Patient taking differently: Place 2 sprays into both nostrils as needed for rhinitis.), Disp: 30 mL, Rfl: 0   lipase/protease/amylase (CREON ) 36000 UNITS CPEP capsule, Take 36,000 Units by mouth 4 (four) times daily as needed (Stomach Pain)., Disp: , Rfl:    loperamide  (IMODIUM ) 2 MG capsule, Take 1 capsule (2 mg total) by mouth as needed for diarrhea or loose stools., Disp: 30 capsule, Rfl: 0   MAGNESIUM PO, Take 1 tablet by mouth daily., Disp: , Rfl:    methylPREDNISolone  (MEDROL  DOSEPAK) 4 MG TBPK tablet, Take 6 tablets on day 1.  Take 5  tablets on day 2.  Take 4 tablets on day 3.  Take 3 tablets on day 4.  Take 2 tablets on day 5.  Take 1 tablet on day 6., Disp: 21 tablet, Rfl: 0   montelukast  (SINGULAIR ) 10 MG tablet, Take 1 tablet (10 mg total) by mouth at bedtime., Disp: 90 tablet, Rfl: 3   Multiple Vitamin (MULTI-VITAMIN) tablet, Take 1 tablet by mouth daily., Disp: , Rfl:    ondansetron  (ZOFRAN ) 4 MG tablet, Take 1 tablet (4 mg total) by mouth every 8 (eight) hours as needed for nausea or vomiting., Disp: 30 tablet, Rfl: 0   pantoprazole  (PROTONIX ) 40 MG tablet, Take 1 tablet (40 mg total) by mouth daily., Disp: 30 tablet, Rfl: 3   PARoxetine  (PAXIL ) 20 MG tablet,  Take 1 tablet (20 mg total) by mouth daily., Disp: 90 tablet, Rfl: 3   psyllium (HYDROCIL/METAMUCIL) 95 % PACK, Take 1 packet by mouth 2 (two) times daily. (Patient taking differently: Take 1 packet by mouth 2 (two) times daily as needed for mild constipation.), Disp: 240 each, Rfl:    torsemide  (DEMADEX ) 10 MG tablet, Take 1 tablet (10 mg total) by mouth daily., Disp: 90 tablet, Rfl: 3   traMADol  (ULTRAM ) 50 MG tablet, Take 1 tablet (50 mg total) by mouth 2 (two) times daily., Disp: , Rfl:    traZODone  (DESYREL ) 50 MG tablet, Take 1 tablet (50 mg total) by mouth at bedtime as needed for sleep., Disp: 90 tablet, Rfl: 1   VITAMIN D  PO, Take 1 capsule by mouth daily., Disp: , Rfl:    Objective:     There were no vitals filed for this visit.    There is no height or weight on file to calculate BMI.    Physical Exam:    ***   Electronically signed by:  Odis Mace D[facility] 7:34 AM 04/27/23

## 2023-04-28 ENCOUNTER — Ambulatory Visit: Payer: Medicare HMO | Admitting: Sports Medicine

## 2023-04-29 ENCOUNTER — Ambulatory Visit: Payer: Medicare HMO | Admitting: Licensed Clinical Social Worker

## 2023-04-29 DIAGNOSIS — E1142 Type 2 diabetes mellitus with diabetic polyneuropathy: Secondary | ICD-10-CM | POA: Diagnosis not present

## 2023-04-29 DIAGNOSIS — E611 Iron deficiency: Secondary | ICD-10-CM | POA: Diagnosis not present

## 2023-04-29 DIAGNOSIS — D649 Anemia, unspecified: Secondary | ICD-10-CM | POA: Diagnosis not present

## 2023-04-29 DIAGNOSIS — K219 Gastro-esophageal reflux disease without esophagitis: Secondary | ICD-10-CM | POA: Diagnosis not present

## 2023-04-29 DIAGNOSIS — I110 Hypertensive heart disease with heart failure: Secondary | ICD-10-CM | POA: Diagnosis not present

## 2023-04-29 DIAGNOSIS — F331 Major depressive disorder, recurrent, moderate: Secondary | ICD-10-CM

## 2023-04-29 DIAGNOSIS — I5032 Chronic diastolic (congestive) heart failure: Secondary | ICD-10-CM | POA: Diagnosis not present

## 2023-04-29 DIAGNOSIS — K8681 Exocrine pancreatic insufficiency: Secondary | ICD-10-CM | POA: Diagnosis not present

## 2023-04-29 DIAGNOSIS — G4733 Obstructive sleep apnea (adult) (pediatric): Secondary | ICD-10-CM | POA: Diagnosis not present

## 2023-04-29 DIAGNOSIS — I951 Orthostatic hypotension: Secondary | ICD-10-CM | POA: Diagnosis not present

## 2023-04-29 DIAGNOSIS — J181 Lobar pneumonia, unspecified organism: Secondary | ICD-10-CM | POA: Diagnosis not present

## 2023-04-29 DIAGNOSIS — K5730 Diverticulosis of large intestine without perforation or abscess without bleeding: Secondary | ICD-10-CM | POA: Diagnosis not present
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
## 2023-05-02 ENCOUNTER — Inpatient Hospital Stay: Admission: RE | Admit: 2023-05-02 | Payer: Medicare HMO | Source: Ambulatory Visit

## 2023-05-04 DIAGNOSIS — I951 Orthostatic hypotension: Secondary | ICD-10-CM | POA: Diagnosis not present

## 2023-05-04 DIAGNOSIS — D649 Anemia, unspecified: Secondary | ICD-10-CM | POA: Diagnosis not present

## 2023-05-04 DIAGNOSIS — K5730 Diverticulosis of large intestine without perforation or abscess without bleeding: Secondary | ICD-10-CM | POA: Diagnosis not present

## 2023-05-04 DIAGNOSIS — J181 Lobar pneumonia, unspecified organism: Secondary | ICD-10-CM | POA: Diagnosis not present

## 2023-05-04 DIAGNOSIS — E1142 Type 2 diabetes mellitus with diabetic polyneuropathy: Secondary | ICD-10-CM | POA: Diagnosis not present

## 2023-05-04 DIAGNOSIS — K8681 Exocrine pancreatic insufficiency: Secondary | ICD-10-CM | POA: Diagnosis not present

## 2023-05-04 DIAGNOSIS — G4733 Obstructive sleep apnea (adult) (pediatric): Secondary | ICD-10-CM | POA: Diagnosis not present

## 2023-05-04 DIAGNOSIS — I5032 Chronic diastolic (congestive) heart failure: Secondary | ICD-10-CM | POA: Diagnosis not present

## 2023-05-04 DIAGNOSIS — I110 Hypertensive heart disease with heart failure: Secondary | ICD-10-CM | POA: Diagnosis not present

## 2023-05-05 ENCOUNTER — Ambulatory Visit: Payer: Self-pay | Admitting: Licensed Clinical Social Worker

## 2023-05-05 ENCOUNTER — Encounter (HOSPITAL_BASED_OUTPATIENT_CLINIC_OR_DEPARTMENT_OTHER): Payer: Self-pay | Admitting: *Deleted

## 2023-05-05 NOTE — Patient Outreach (Signed)
  Care Coordination   Follow Up Visit Note   05/05/2023 Name: [name] MRN: [id number] DOB: [birth year]  [name] is a 68 y.o. year old female who sees Caudle, Shelton Silvas, FNP for primary care. I spoke with  [name] by phone today.  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 Patient stated that he sister provided transportation to her medical appointments and if something changes she will let the SW know. The Sw will follow up on this and offer other resources for the future if the patient wants it. 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 She has talked to her sister about living with her but they have not discussed it anymore. Sw will mail out a senior housing resources list  SW has a scheduled appointment to follow up with the patient on 05/23/2023 at 1:00 pm         SDOH assessments and interventions completed:  Yes  SDOH Interventions Today    Flowsheet Row Most Recent Value  SDOH Interventions   Food Insecurity Interventions Intervention Not Indicated  Housing Interventions Community Resources Provided  [SW will  mail out housing resources because the [employer] will be building more around her and the landlord may be selling property.]  Utilities Interventions Intervention Not Indicated, Community Resources Provided  [Got utility bills up to date]        Care Coordination Interventions:  Yes, provided  Interventions Today    Flowsheet Row Most  Recent Value  General Interventions   General Interventions Discussed/Reviewed General Interventions Reviewed, [employer]  [Sw will mail out [employer] and SW and patient will follow up on SCAT Application]        Follow up plan: Follow up call scheduled for 05/23/2023 at 1:00 pm    Encounter Outcome:  Patient Visit Completed  [name], PhD [facility], [facility] Social Worker Direct Dial: [phone]  Fax: [phone]

## 2023-05-05 NOTE — Patient Instructions (Signed)
Visit Information  Thank you for taking time to visit with me today. Please don't hesitate to contact me if I can be of assistance to you.   Following are the goals we discussed today:   Goals Addressed             This Visit's Progress    Care Coordination Activities- BSW Plan of Care       Care Coordination Interventions: Patient stated that she was able to get her utility bill paid and that is no longer a concern. Patient stated that he sister provided transportation to her medical appointments and if something changes she will let the SW know. The Sw will follow up on this and offer other resources for the future if the patient wants it. 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 She has talked to her sister about living with her but they have not discussed it anymore. Sw will mail out a senior housing resources list  SW has a scheduled appointment to follow up with the patient on 05/23/2023 at 1:00 pm         Our next appointment is by telephone on 05/23/2023 at 1:00 pm  Please call the care guide team at [phone] if you need to cancel or reschedule your appointment.   If you are experiencing a Mental Health or Behavioral Health Crisis or need someone to talk to, please call the Suicide and Crisis Lifeline: 988 go to [facility] [address], [place] [phone]) call 911  Patient verbalizes understanding of instructions and care plan provided today and agrees to view in MyChart. Active MyChart status and patient understanding of how to access instructions and care plan via MyChart confirmed with patient.     [name], PhD [facility], [facility] Social Worker Direct Dial:  [phone]  Fax: [phone]

## 2023-05-09 ENCOUNTER — Encounter: Payer: Self-pay | Admitting: Internal Medicine

## 2023-05-09 ENCOUNTER — Ambulatory Visit: Payer: Medicare HMO | Admitting: Internal Medicine

## 2023-05-09 VITALS — BP 114/80 | HR 92 | Temp 98.2°F | Ht 65.0 in | Wt 259.4 lb

## 2023-05-09 DIAGNOSIS — J45909 Unspecified asthma, uncomplicated: Secondary | ICD-10-CM | POA: Diagnosis not present

## 2023-05-09 DIAGNOSIS — K219 Gastro-esophageal reflux disease without esophagitis: Secondary | ICD-10-CM

## 2023-05-09 LAB — POCT EXHALED NITRIC OXIDE: FeNO level (ppb): 21

## 2023-05-09 MED ORDER — PANTOPRAZOLE SODIUM 40 MG PO TBEC: 40.0000 mg | DELAYED_RELEASE_TABLET | Freq: Two times a day (BID) | ORAL | 3 refills | Status: AC

## 2023-05-09 MED ORDER — FLUTICASONE-SALMETEROL 250-50 MCG/ACT IN AEPB: 1.0000 | INHALATION_SPRAY | Freq: Two times a day (BID) | RESPIRATORY_TRACT | 11 refills | Status: DC

## 2023-05-09 NOTE — Progress Notes (Signed)
[name]    [id number]    1955/12/14  Primary Care Physician:Caudle, Shelton Silvas, FNP  Referring Physician: Hilbert Bible, FNP [address] [place],  [place] [address] Reason for Consultation: recurrent pneumonia Date of Consultation: 05/09/2023  Chief complaint:   Chief Complaint  Patient presents with   Hospitalization Follow-up     HPI: [name] is a 68 y.o. woman who presents for new patient evaluation for wheezing, dyspnea and recurrent pneumonia.   Symptoms started with exertion about 6 months ago. She has seasonal bronchitis about once a year, usually in the winter. Takes her longer to recover from URIs. She denies childhood respiratory disease - but was in and out of the hospital as a child.   Symptoms respond to steroids. She has albuterol inhaler uses about once/day. Also has advair which she started around the first time she got pneumonia. She does feel like it's helping her breathing and opens her up. She coughs also has a dry cough which does come with shortness of breath. She has a nebulizer machine which helps the coughing as well.   Symptoms are always with exertion and the coughing is worse at night.    Social history:  Occupation: retired Runner, broadcasting/film/video - kg and first grade.  Exposures: lives at home independently.  Smoking history: brief smoking history, nothing since 2011. Husband smoked as well as father.   Social History   Occupational History   Occupation: retired  Tobacco Use   Smoking status: Former    Current packs/day: 0.00    Average packs/day: 0.5 packs/day for 10.0 years (5.0 ttl pk-yrs)    Types: Cigarettes    Start date: 04/20/1999    Quit date: 04/19/2009    Years since quitting: 14.0   Smokeless tobacco: Never  Vaping Use   Vaping status: Never Used  Substance and Sexual Activity   Alcohol use: Not Currently    Comment: occ. social- wine-1 drink monthly   Drug use: No   Sexual  activity: Not Currently    Relevant family history:  Family History  Problem Relation Age of Onset   Diabetes Mother    Atrial fibrillation Mother    Hypertension Mother    Arthritis Mother    Heart disease Father        Unsure of details   Hypertension Father    Prostate cancer Father    Alzheimer's disease Father    Arthritis Father    Cancer Father    Kidney disease Brother    Cancer Brother    Healthy Sister    Diabetes Maternal Grandmother    Heart disease Maternal Grandmother    Vision loss Maternal Grandmother    Stroke Maternal Grandfather 50   Hypertension Maternal Grandfather    Hypertension Paternal Grandmother    Alzheimer's disease Paternal Grandmother    Colon cancer Neg Hx    Colon polyps Neg Hx    Stomach cancer Neg Hx    Esophageal cancer Neg Hx    Pancreatic cancer Neg Hx    Liver disease Neg Hx     Past Medical History:  Diagnosis Date   Allergy 2010   Anemia    takes Ferrous Sulfate daily   Anxiety    takes Citalopram daily   Arthritis    Asthma    2004-prior to gastric bypass and no problems since   CAP (community acquired pneumonia) 07/23/2021   CHF (congestive heart  failure) (HCC)    takes Lasix daily as needed   Chronic back pain    spondylolisthesis/stenosis/radiculopathy   Complication of anesthesia yrs ago   slow to wake up   Depression    Diabetes (HCC)    DVT (deep venous thrombosis) (HCC) 01/17/2013   past hx. -tx.5-6 yrs ago bilateral legs, occ. sporadic swelling, has IVC filter implanted   Dysrhythmia    "heart tends to flutter"   Fibromyalgia    Fracture    right foot and is in a cam boot   Gallstones    GERD (gastroesophageal reflux disease)    hx of-no meds now   HCAP (healthcare-associated pneumonia) 02/19/2023   Heart murmur    History of bronchitis 2012 or 2013   History of colon polyps    Hypertension    takes Metoprolol daily   Insomnia    takes Melatonin daily   Pelvic floor dysfunction    Peripheral  neuropathy    Pneumonia 90's   hx of   S/P gastric bypass 2003   Sleep apnea    no cpap used in many yrs after weight lost-no machine now   Urinary frequency    Urinary urgency    Vitamin D deficiency     Past Surgical History:  Procedure Laterality Date   BALLOON DILATION N/A 01/31/2013   Procedure: BALLOON DILATION;  Surgeon: Willis Modena, MD;  Location: [facility];  Service: Endoscopy;  Laterality: N/A;   CARDIAC EVENT MONITOR  03/2019   Predominantly sinus rhythm.  Rates range from 48-124 bpm.  Average 74 bpm.  Frequent short bursts (3-15 beats) PAT/PSVT--not indicated as being symptomatic on diary.  Otherwise rare PACs and PVCs.   CARDIAC EVENT MONITOR  08/2021   14-Day Zio Patch: Predominantly sinus rhythm with rate range 45 to 135 bpm.  Average 73 bpm.  Occasional (1.3%) PACs with rare PVCs and rare PAC couplets.  24 episodes of atrial runs: Fastest was 11 beats at a rate of 184 bpm, longest was 13 beats with a rate of 92 bpm.  No sustained tachyarrhythmias or bradycardia arrhythmias.  No atrial fibrillation or flutter.   CARDIOPULMONARY EXERCISE STRESS TEST (CPX)  10/27/2021   Good effort despite dyspnea and wheezing.  Modified Naughton protocol on treadmill.  Normal heart rate response.  No sustained arrhythmias.  Preexercise spirometry normal.  Low normal peak VO2. Low normal functional capacity with NO Clear Cardiopulmonary Limitation.  Overall limitation primarily due to obesity & deconditioning, along with a component of Diastolic Dysfunction   CHOLECYSTECTOMY  2007   COLONOSCOPY     CT CTA CORONARY W/CA SCORE W/CM &/OR WO/CM  09/21/2019   Coronary Calcium Score 0.  Normal RCA dominant coronary anatomy.  CAD RADS 1-minimal nonobstructive CAD (0-24%).  Consider nonatherosclerotic cause for chest pain.  Consider preventative therapy and risk factor modification.   DILATION AND CURETTAGE OF UTERUS  yrs ago   ESOPHAGOGASTRODUODENOSCOPY (EGD) WITH PROPOFOL  03/01/2012    Procedure: ESOPHAGOGASTRODUODENOSCOPY (EGD) WITH PROPOFOL;  Surgeon: Willis Modena, MD;  Location: [facility];  Service: Endoscopy;  Laterality: N/A;   ESOPHAGOGASTRODUODENOSCOPY (EGD) WITH PROPOFOL N/A 01/31/2013   Procedure: ESOPHAGOGASTRODUODENOSCOPY (EGD) WITH PROPOFOL;  Surgeon: Willis Modena, MD;  Location: [facility];  Service: Endoscopy;  Laterality: N/A;   ESOPHAGOGASTRODUODENOSCOPY (EGD) WITH PROPOFOL N/A 09/02/2015   Procedure: ESOPHAGOGASTRODUODENOSCOPY (EGD) WITH PROPOFOL;  Surgeon: Iva Boop, MD;  Location: [facility];  Service: Endoscopy;  Laterality: N/A;   FRACTURE SURGERY     GASTRIC  BY-PASS  2004   HERNIA REPAIR  2005   INSERTION OF VENA CAVA FILTER  01/17/2013   inserted 2004- "abdomen"   LIGAMENT REPAIR Right 1987   Rt. knee scope   MAXIMUM ACCESS (MAS)POSTERIOR LUMBAR INTERBODY FUSION (PLIF) 2 LEVEL N/A 06/07/2014   Procedure: L4-5 L5-S1 FOR MAXIMUM ACCESS (MAS) POSTERIOR LUMBAR INTERBODY FUSION ;  Surgeon: Maeola Harman, MD;  Location: [facility];  Service: Neurosurgery;  Laterality: N/A;  L4-5 L5-S1 FOR MAXIMUM ACCESS (MAS) POSTERIOR LUMBAR INTERBODY FUSION    SPINE SURGERY  2017   TONSILLECTOMY     as child   TRANSTHORACIC ECHOCARDIOGRAM  12/2020   EF 55 to 60%.  No RWMA.  Mild LVH.  GR 1 DD-moderate LA dilation..  Mild MR..  Normal RV size/function.  Normal PAP.  Normal RAP. => Essentially stable from December 2020     Physical Exam: Blood pressure 114/80, pulse 92, temperature 98.2 F (36.8 C), temperature source Oral, height 5\' 5"  (1.651 m), weight 259 lb 6.4 oz (117.7 kg), SpO2 94%. Gen:      No acute distress ENT:  +cobblestoning in oropharynx, no nasal polyps, mucus membranes moist Lungs:    No increased respiratory effort, symmetric chest wall excursion, clear to auscultation bilaterally, no wheezes or crackles CV:         Regular rate and rhythm; no murmurs, rubs, or gallops.  No pedal edema Abd:      + bowel sounds; soft, non-tender;  obese MSK: no acute synovitis of DIP or PIP joints, no mechanics hands.  Skin:      Warm and dry; no rashes Neuro: normal speech, no focal facial asymmetry Psych: alert and oriented x3, normal mood and affect   Data Reviewed/Medical Decision Making:  Independent interpretation of tests: Imaging:  Review of patient's CT Chest PE Nov 2024 images revealed mild peribronchial thickening, cardiomegaly, small LUL ground glass opacity. The patient's images have been independently reviewed by me.    PFTs: I have personally reviewed the patient's PFTs and normal pulmonary function    Latest Ref Rng & Units 12/23/2020    3:01 PM  PFT Results  FVC-Pre L 1.88   FVC-Predicted Pre % 80   FVC-Post L 1.94   FVC-Predicted Post % 82   Pre FEV1/FVC % % 78   Post FEV1/FCV % % 76   FEV1-Pre L 1.46   FEV1-Predicted Pre % 80   FEV1-Post L 1.47   DLCO uncorrected ml/min/mmHg 14.40   DLCO UNC% % 75   DLCO corrected ml/min/mmHg 14.32   DLCO COR %Predicted % 75   DLVA Predicted % 100   TLC L 4.47   TLC % Predicted % 91   RV % Predicted % 113     Labs:  Lab Results  Component Value Date   WBC 7.6 03/14/2023   HGB 11.2 03/14/2023   HCT 36.2 03/14/2023   MCV 86 03/14/2023   PLT 231 03/14/2023   Lab Results  Component Value Date   NA 143 04/04/2023   K 4.2 04/04/2023   CL 107 (H) 04/04/2023   CO2 20 04/04/2023     Immunization status:  Immunization History  Administered Date(s) Administered   Fluad Quad(high Dose 65+) 12/26/2020   Influenza Split 02/01/2007, 12/16/2017   Influenza Whole 02/01/2007   Influenza, High Dose Seasonal PF 01/11/2023   Influenza,inj,Quad PF,6+ Mos 12/16/2017   Influenza-Unspecified 02/19/2013, 05/20/2014, 02/01/2016, 12/17/2016, 12/21/2018, 02/19/2020, 01/06/2022   Moderna Covid-19 Vaccine Bivalent Booster 47yrs & up  01/06/2022   Moderna Sars-Covid-2 Vaccination 05/13/2019, 06/10/2019, 02/12/2020, 08/12/2020, 12/26/2020   PNEUMOCOCCAL CONJUGATE-20 03/05/2021    Pneumococcal Conjugate-13 01/02/2019   Respiratory Syncytial Virus Vaccine,Recomb Aduvanted(Arexvy) 03/08/2022   Td 11/12/1997   Td (Adult),5 Lf Tetanus Toxid, Preservative Free 11/12/1997   Tdap 11/12/1997, 08/07/2014   Zoster Recombinant(Shingrix) 10/04/2019, 03/08/2022   Zoster, Unspecified 10/11/2019     I reviewed prior external note(s) from hospital stay, primary care  I reviewed the result(s) of the labs and imaging as noted above.   I have ordered feno  Assessment:  Moderate persistent asthma, not well controlled History of passive smoke exposure GERD, not well controlled  Plan/Recommendations: Feno today 21 ppb  Increase your advair to a higher dose 250 mcg 1 puff twice daily. Gargle after use.  Continue albuterol inhaler or duoneb nebulizer as needed for wheezing, chest tightness, shortness of breath.   Increase your protonix to twice daily.   Talk to your primary care about wegovy/ozempic. Weight loss will help with both symptoms.   We discussed disease management and progression at length today including diet and lifestyle adjustments for GERD   Return to Care: Return in about 3 months (around 08/07/2023).  Durel Salts, MD Pulmonary and Critical Care Medicine [facility] Office:[phone]  CC: Haywood Lasso, Shelton Silvas, [place]

## 2023-05-09 NOTE — Patient Instructions (Addendum)
It was a pleasure to see you today!  Please schedule follow up scheduled with myself in 3 months.  If my schedule is not open yet, we will contact you with a reminder closer to that time. Please call [phone] if you haven't heard from [place] a month before, and always call us sooner if issues or concerns arise. You can also send [place] a message through MyChart, but but aware that this is not to be used for urgent issues and it may take up to 5-7 days to receive a reply. Please be aware that you will likely be able to view your results before I have a chance to respond to them. Please give [place] 5 business days to respond to any non-urgent results.   I think your symptoms are related to poorly controlled asthma.   Increase your advair to a higher dose 250 mcg 1 puff twice daily. Gargle after use.  Continue albuterol inhaler or duoneb nebulizer as needed for wheezing, chest tightness, shortness of breath.   Increase your protonix to twice daily.   Talk to your primary care about wegovy/ozempic. Weight loss will help with both GERD and asthma.   What is GERD? Gastroesophageal reflux disease (GERD) is gastroesophageal reflux diseasewhich occurs when the lower esophageal sphincter (LES) opens spontaneously, for varying periods of time, or does not close properly and stomach contents rise up into the esophagus. GER is also called acid reflux or acid regurgitation, because digestive juices--called acids--rise up with the food. The esophagus is the tube that carries food from the mouth to the stomach. The LES is a ring of muscle at the bottom of the esophagus that acts like a valve between the esophagus and stomach.  When acid reflux occurs, food or fluid can be tasted in the back of the mouth. When refluxed stomach acid touches the lining of the esophagus it may cause a burning sensation in the chest or throat called heartburn or acid indigestion. Occasional reflux is common. Persistent reflux that occurs more  than twice a week is considered GERD, and it can eventually lead to more serious health problems. People of all ages can have GERD. Studies have shown that GERD may worsen or contribute to asthma, chronic cough, and pulmonary fibrosis.   What are the symptoms of GERD? The main symptom of GERD in adults is frequent heartburn, also called acid indigestion--burning-type pain in the lower part of the mid-chest, behind the breast bone, and in the mid-abdomen.  Not all reflux is acidic in nature, and many patients don't have heart burn at all. Sometimes it feels like a cough (either dry or with mucus), choking sensation, asthma, shortness of breath, waking up at night, frequent throat clearing, or trouble swallowing.    What causes GERD? The reason some people develop GERD is still unclear. However, research shows that in people with GERD, the LES relaxes while the rest of the esophagus is working. Anatomical abnormalities such as a hiatal hernia may also contribute to GERD. A hiatal hernia occurs when the upper part of the stomach and the LES move above the diaphragm, the muscle wall that separates the stomach from the chest. Normally, the diaphragm helps the LES keep acid from rising up into the esophagus. When a hiatal hernia is present, acid reflux can occur more easily. A hiatal hernia can occur in people of any age and is most often a normal finding in otherwise healthy people over age 60. Most of the time, a hiatal  hernia produces no symptoms.   Other factors that may contribute to GERD include - Obesity or recent weight gain - Pregnancy  - Smoking  - Diet - Certain medications  Common foods that can worsen reflux symptoms include: - carbonated beverages - artificial sweeteners - citrus fruits  - chocolate  - drinks with caffeine or alcohol  - fatty and fried foods  - garlic and onions  - mint flavorings  - spicy foods  - tomato-based foods, like spaghetti sauce, salsa, chili, and pizza    Lifestyle Changes If you smoke, stop.  Avoid foods and beverages that worsen symptoms (see above.) Lose weight if needed.  Eat small, frequent meals.  Wear loose-fitting clothes.  Avoid lying down for 3 hours after a meal.  Raise the head of your bed 6 to 8 inches by securing wood blocks under the bedposts. Just using extra pillows will not help, but using a wedge-shaped pillow may be helpful.  Medications  H2 blockers, such as cimetidine (Tagamet HB), famotidine (Pepcid AC), nizatidine (Axid AR), and ranitidine (Zantac 75), decrease acid production. They are available in prescription strength and over-the-counter strength. These drugs provide short-term relief and are effective for about half of those who have GERD symptoms.  Proton pump inhibitors include omeprazole (Prilosec, Zegerid), lansoprazole (Prevacid), pantoprazole (Protonix), rabeprazole (Aciphex), and esomeprazole (Nexium), which are available by prescription. Prilosec is also available in over-the-counter strength. Proton pump inhibitors are more effective than H2 blockers and can relieve symptoms and heal the esophageal lining in almost everyone who has GERD.  Because drugs work in different ways, combinations of medications may help control symptoms. People who get heartburn after eating may take both antacids and H2 blockers. The antacids work first to neutralize the acid in the stomach, and then the H2 blockers act on acid production. By the time the antacid stops working, the H2 blocker will have stopped acid production. Your health care provider is the best source of information about how to use medications for GERD.   Points to Remember 1. You can have GERD without having heartburn. Your symptoms could include a dry cough, asthma symptoms, or trouble swallowing.  2. Taking medications daily as prescribed is important in controlling you symptoms.  Sometimes it can take up to 8 weeks to fully achieve the effects of the  medications prescribed.  3. Coughing related to GERD can be difficult to treat and is very frustrating!  However, it is important to stick with these medications and lifestyle modifications before pursuing more aggressive or invasive test and treatments.

## 2023-05-09 NOTE — Progress Notes (Signed)
The patient has been prescribed the inhaler advair. Inhaler technique was demonstrated to patient. The patient subsequently demonstrated correct technique.  

## 2023-05-10 DIAGNOSIS — J181 Lobar pneumonia, unspecified organism: Secondary | ICD-10-CM | POA: Diagnosis not present

## 2023-05-10 DIAGNOSIS — K8681 Exocrine pancreatic insufficiency: Secondary | ICD-10-CM | POA: Diagnosis not present

## 2023-05-10 DIAGNOSIS — G4733 Obstructive sleep apnea (adult) (pediatric): Secondary | ICD-10-CM | POA: Diagnosis not present

## 2023-05-10 DIAGNOSIS — I951 Orthostatic hypotension: Secondary | ICD-10-CM | POA: Diagnosis not present

## 2023-05-10 DIAGNOSIS — I110 Hypertensive heart disease with heart failure: Secondary | ICD-10-CM | POA: Diagnosis not present

## 2023-05-10 DIAGNOSIS — E1142 Type 2 diabetes mellitus with diabetic polyneuropathy: Secondary | ICD-10-CM | POA: Diagnosis not present

## 2023-05-10 DIAGNOSIS — K5730 Diverticulosis of large intestine without perforation or abscess without bleeding: Secondary | ICD-10-CM | POA: Diagnosis not present

## 2023-05-10 DIAGNOSIS — I5032 Chronic diastolic (congestive) heart failure: Secondary | ICD-10-CM | POA: Diagnosis not present

## 2023-05-10 DIAGNOSIS — D649 Anemia, unspecified: Secondary | ICD-10-CM | POA: Diagnosis not present

## 2023-05-11 ENCOUNTER — Inpatient Hospital Stay: Admission: RE | Admit: 2023-05-11 | Payer: Medicare HMO | Source: Ambulatory Visit

## 2023-05-12 ENCOUNTER — Other Ambulatory Visit: Payer: Medicare HMO

## 2023-05-12 ENCOUNTER — Encounter (HOSPITAL_BASED_OUTPATIENT_CLINIC_OR_DEPARTMENT_OTHER): Payer: Self-pay | Admitting: Family Medicine

## 2023-05-12 NOTE — Telephone Encounter (Signed)
Mychart messages were sent to pt as we received a message from [employer] that pt was needing microalbumin done. Appt scheduled for pt to come by the office to provide [place] a urine sample for this.   Mychart message was also sent by pt:  [name]     I really need to see her because my tramadol needs to be refilled and the other doctors that I visited said I need to discuss some things with primary care doctor. I could schedule a virtual appointment with [name] as well. You can schedule appointment for urine sample on Wednesday.  Thanks    Looked at [employer] schedule next week and I'm not seeing any openings. Alexis, please advise on what you recommend for pt.

## 2023-05-13 ENCOUNTER — Other Ambulatory Visit (HOSPITAL_BASED_OUTPATIENT_CLINIC_OR_DEPARTMENT_OTHER): Payer: Self-pay

## 2023-05-13 ENCOUNTER — Ambulatory Visit (INDEPENDENT_AMBULATORY_CARE_PROVIDER_SITE_OTHER): Payer: Medicare HMO | Admitting: Licensed Clinical Social Worker

## 2023-05-13 ENCOUNTER — Telehealth: Payer: Self-pay | Admitting: Family Medicine

## 2023-05-13 ENCOUNTER — Telehealth: Payer: Self-pay | Admitting: Internal Medicine

## 2023-05-13 DIAGNOSIS — F331 Major depressive disorder, recurrent, moderate: Secondary | ICD-10-CM | POA: Diagnosis not present

## 2023-05-13 NOTE — Telephone Encounter (Signed)
I called and spoke with [name] and notified her that unfortunately we do not get advair samples  She states nothing further needed at this time  Will close encounter

## 2023-05-13 NOTE — Progress Notes (Unsigned)
[facility] Counselor/Therapist Progress Note  Patient ID: [name], MRN: [id number]    Date: 05/13/23  Time Spent: 1004  am - 1048 am : 44 Minutes  Treatment Type: Individual Therapy.  Reported Symptoms: Symptoms of depression related to current life circumstances.   Mental Status Exam: Appearance:  Casual     Behavior: Appropriate  Motor: Normal  Speech/Language:  Slow  Affect: Flat  Mood: depressed  Thought process: normal  Thought content:   WNL  Sensory/Perceptual disturbances:   WNL  Orientation: oriented to person, place, time/date, situation, day of week, month of year, and year  Attention: Fair  Concentration: Fair  Memory: WNL  Fund of knowledge:  Good  Insight:   Fair  Judgment:  Good  Impulse Control: Good   Risk Assessment: Danger to Self:  No Self-injurious Behavior: No Danger to Others: No Duty to Warn:no Physical Aggression / Violence:No  Access to Firearms a concern: No  Gang Involvement:No   Subjective:   [name] participated from home, via video. Patient is aware of risk and limitations with virtual session and consented to treatment. Therapist participated from home office. We met online due to patient request.  [name] presented for her session looking poorly. She reported that she hasn't been feeling too well an was having difficulty breathing. Clinician inquired about her health and patient reports that she was recently notified that an asthma condition she hasn't dealt with since childhood has returned.  Clinician inquired if she had been prescribed anything to assist her when having difficulty breathing she stated she was given an inhaler but did not have it at the time. As Clinician encouraged patient to get the inhaler she admitted that she could not get it from the pharmacy because she didn't have enough money. Clinician processed with patient options for payment for the medication. Clinician cut the video  call to contact the provider and see if there was a generic they could provide that was more affordable. Clinician then contacted several pharmacies to compare pricing for patient. Clinician was able to contact a group from a local church to assist with payment and to get patient her medication. Clinician contacted patient to let her know the medication would be delivered. Clinician the processed with patient the importance of taking her medications as prescribed. Clinician encouraged patient to be transparent with her providers about her financial situation and to be open to assistance. Patients medication arrived while Clinician was on the phone and patient utilized her inhaler and was breathing much better by the time the call ended.   GOALS:   Symptom reduction: Reducing the severity and frequency of depressive symptoms like sadness, hopelessness, fatigue, and loss of interest in activities.  Functional improvement: Enhancing daily functioning by improving sleep patterns, appetite, energy levels, and ability to participate in social activities.  Coping skill development: Teaching strategies to manage stress, frustration, and negative thoughts related to their medical condition.  Self-care promotion: Encouraging healthy lifestyle habits like regular exercise, balanced diet, and relaxation techniques.  Education and awareness: Providing information about the relationship between their medical condition and depression, and how to manage both effectively.  Collaboration with medical team: Working closely with the patient's doctors to coordinate treatment plans and address any concerns related to their medical condition.  Addressing specific challenges: Pain management: Developing strategies to manage chronic pain if present  Disability concerns: Addressing anxieties and limitations related to functional impairments  Treatment adherence: Encouraging consistent medication use and adherence  to  medical recommendations  Treatment modalities that might be used: Psychotherapy: Cognitive Behavioral Therapy (CBT), Interpersonal Therapy (IPT), or supportive therapy to identify negative thought patterns and develop coping mechanisms.  Medication management: Antidepressant medication prescribed by a healthcare provider, considering potential interactions with other medications.  Lifestyle modifications: Promoting regular exercise, healthy sleep hygiene, stress management techniques, and social engagement.  Important considerations: Individualized approach: Treatment goals should be tailored to the specific needs and circumstances of each patient. Regular monitoring: Ongoing assessment of symptoms and treatment effectiveness is crucial to adjust interventions as needed. Patient engagement: Active participation and collaboration with the patient throughout the treatment process is key for optimal outcomes.  Continue with bi weekly therapy sessions. Treatment plan will be reviewed by 11/02/2023.   Interventions: Cognitive Behavioral Therapy and Solution-Oriented/Positive Psychology  Diagnosis: Major Depressive Disorder, recurrent, moderate        [name] MSW, LCSW/DATE 05/13/2023

## 2023-05-13 NOTE — Telephone Encounter (Signed)
This is not my patient I cannot address

## 2023-05-13 NOTE — Telephone Encounter (Signed)
[facility] states that when they went to r/s the pt's bone density appointment that the order 'got lost' and they need another bone density scan order put in.

## 2023-05-13 NOTE — Telephone Encounter (Signed)
Irving Burton states patient Advair is too expensive. Would like to know if we have samples. Would need before the weekend. Irving Burton will be coming by office to pick up samples. Irving Burton phone number is [phone].

## 2023-05-15 ENCOUNTER — Other Ambulatory Visit (HOSPITAL_BASED_OUTPATIENT_CLINIC_OR_DEPARTMENT_OTHER): Payer: Self-pay | Admitting: Family Medicine

## 2023-05-15 DIAGNOSIS — Z13820 Encounter for screening for osteoporosis: Secondary | ICD-10-CM

## 2023-05-16 DIAGNOSIS — I951 Orthostatic hypotension: Secondary | ICD-10-CM | POA: Diagnosis not present

## 2023-05-16 DIAGNOSIS — K8681 Exocrine pancreatic insufficiency: Secondary | ICD-10-CM | POA: Diagnosis not present

## 2023-05-16 DIAGNOSIS — J181 Lobar pneumonia, unspecified organism: Secondary | ICD-10-CM | POA: Diagnosis not present

## 2023-05-16 DIAGNOSIS — G4733 Obstructive sleep apnea (adult) (pediatric): Secondary | ICD-10-CM | POA: Diagnosis not present

## 2023-05-16 DIAGNOSIS — I110 Hypertensive heart disease with heart failure: Secondary | ICD-10-CM | POA: Diagnosis not present

## 2023-05-16 DIAGNOSIS — I5032 Chronic diastolic (congestive) heart failure: Secondary | ICD-10-CM | POA: Diagnosis not present

## 2023-05-16 DIAGNOSIS — K5730 Diverticulosis of large intestine without perforation or abscess without bleeding: Secondary | ICD-10-CM | POA: Diagnosis not present

## 2023-05-16 DIAGNOSIS — E1142 Type 2 diabetes mellitus with diabetic polyneuropathy: Secondary | ICD-10-CM | POA: Diagnosis not present

## 2023-05-16 DIAGNOSIS — D649 Anemia, unspecified: Secondary | ICD-10-CM | POA: Diagnosis not present

## 2023-05-17 ENCOUNTER — Encounter (HOSPITAL_BASED_OUTPATIENT_CLINIC_OR_DEPARTMENT_OTHER): Payer: Self-pay | Admitting: Family Medicine

## 2023-05-17 ENCOUNTER — Telehealth (HOSPITAL_BASED_OUTPATIENT_CLINIC_OR_DEPARTMENT_OTHER): Payer: Medicare HMO | Admitting: Family Medicine

## 2023-05-17 ENCOUNTER — Encounter: Payer: Self-pay | Admitting: Internal Medicine

## 2023-05-17 VITALS — Ht 65.0 in | Wt 259.0 lb

## 2023-05-17 DIAGNOSIS — E611 Iron deficiency: Secondary | ICD-10-CM | POA: Diagnosis not present

## 2023-05-17 DIAGNOSIS — J4540 Moderate persistent asthma, uncomplicated: Secondary | ICD-10-CM | POA: Diagnosis not present

## 2023-05-17 DIAGNOSIS — K219 Gastro-esophageal reflux disease without esophagitis: Secondary | ICD-10-CM

## 2023-05-17 DIAGNOSIS — K8689 Other specified diseases of pancreas: Secondary | ICD-10-CM | POA: Insufficient documentation

## 2023-05-17 DIAGNOSIS — G4733 Obstructive sleep apnea (adult) (pediatric): Secondary | ICD-10-CM

## 2023-05-17 DIAGNOSIS — E66813 Obesity, class 3: Secondary | ICD-10-CM

## 2023-05-17 DIAGNOSIS — Z6841 Body Mass Index (BMI) 40.0 and over, adult: Secondary | ICD-10-CM

## 2023-05-17 MED ORDER — TRELEGY ELLIPTA 100-62.5-25 MCG/ACT IN AEPB: 1.0000 | INHALATION_SPRAY | Freq: Every day | RESPIRATORY_TRACT | 11 refills | Status: DC

## 2023-05-17 NOTE — Progress Notes (Signed)
Virtual Video Visit  I connected with [name] on 05/17/23 at 10:50 AM EST by a video enabled telemedicine application and verified that I am speaking with the correct person using two identifiers.   Location patient: Home Location provider: Clinic Persons participating in the virtual visit: Patient; Cristy Hilts CMA; Jerre Simon, FNP-C  I discussed the limitations of evaluation and management by telemedicine and the availability of in-person appointments. The patient expressed understanding and agreed to proceed.  Chief Complaint  Patient presents with   Medical Management of Chronic Issues    Called and spoke with pt who has some concerns about still having problems with fatigue, not having any energy, and is always feeling cold.    SUBJECTIVE:  HPI:   FATIGUE AND SHORTNESS OF BREATH:   [name] is a 68 year old female with history of moderate persistent asthma, CHF, iron deficiency anemia, GERD, OSA presenting virtually to our office for concerns of ongoing shortness of breath and fatigue. Patient recently saw pulmonologist on May 09, 2023 and was recommended to start Advair inhaler twice daily in addition to using her albuterol rescue inhaler.  Her PPI pantoprazole was increased to twice daily as well and her pulmonologist recommend discussing possible weight management options to help with OSA and likely obesity related hypoventilation that is complicating patient's asthma.  Patient is concerned as she has been taking the Advair for the past week and has not noticed any improvement in her shortness of breath.  She states that she becomes short of breath with exertion and has noticed no improvement in her fatigue.  She denies swelling, weight gain, palpitations, or chest pain.  She is using her albuterol inhaler throughout the day as needed and at times using her albuterol nebulizer treatment.  Patient also has seen digestive health specialist post  hospital admission for pneumonia and chronic anemia for follow-up of possible lower GI bleed due to oral impaired iron absorption and consistently low iron saturation levels.  Patient had a Hemoccult test by digestive health which was negative for blood.  She is repeating this in approximately 1 month.  They are also obtaining a CT abdomen to rule out further issues and patient is also receiving a transfusion of iron in the next 1 to 2 weeks with [facility] infusion center.  Patient was recommended to start Wegovy or Zepbound for weight management and discussed these medications with her GI specialist due to chronic pancreatic insufficiency and status post bariatric surgery...  GI did clear patient to try these medications as long as patient stays on her Creon pancreatic enzyme replacement.   The following portions of the patient's history were reviewed and updated as appropriate: medical history, surgical history, medications, allergies, social history, and family history.    Past Medical History:  Diagnosis Date   Allergy 2010   Anemia    takes Ferrous Sulfate daily   Anxiety    takes Citalopram daily   Arthritis    Asthma    2004-prior to gastric bypass and no problems since   CAP (community acquired pneumonia) 07/23/2021   CHF (congestive heart failure) (HCC)    takes Lasix daily as needed   Chronic back pain    spondylolisthesis/stenosis/radiculopathy   Complication of anesthesia yrs ago   slow to wake up   Depression    Diabetes (HCC)    DVT (deep venous thrombosis) (HCC) 01/17/2013   past hx. -tx.5-6 yrs ago bilateral legs, occ. sporadic swelling, has IVC  filter implanted   Dysrhythmia    "heart tends to flutter"   Fibromyalgia    Fracture    right foot and is in a cam boot   Gallstones    GERD (gastroesophageal reflux disease)    hx of-no meds now   HCAP (healthcare-associated pneumonia) 02/19/2023   Heart murmur    History of bronchitis 2012 or 2013    History of colon polyps    Hypertension    takes Metoprolol daily   Insomnia    takes Melatonin daily   Pelvic floor dysfunction    Peripheral neuropathy    Pneumonia 90's   hx of   Recurrent Clostridioides difficile infection 02/28/2020   S/P gastric bypass 2003   Sleep apnea    no cpap used in many yrs after weight lost-no machine now   Urinary frequency    Urinary urgency    Vitamin D deficiency    Past Surgical History:  Procedure Laterality Date   BALLOON DILATION N/A 01/31/2013   Procedure: BALLOON DILATION;  Surgeon: Willis Modena, MD;  Location: [facility];  Service: Endoscopy;  Laterality: N/A;   CARDIAC EVENT MONITOR  03/2019   Predominantly sinus rhythm.  Rates range from 48-124 bpm.  Average 74 bpm.  Frequent short bursts (3-15 beats) PAT/PSVT--not indicated as being symptomatic on diary.  Otherwise rare PACs and PVCs.   CARDIAC EVENT MONITOR  08/2021   14-Day Zio Patch: Predominantly sinus rhythm with rate range 45 to 135 bpm.  Average 73 bpm.  Occasional (1.3%) PACs with rare PVCs and rare PAC couplets.  24 episodes of atrial runs: Fastest was 11 beats at a rate of 184 bpm, longest was 13 beats with a rate of 92 bpm.  No sustained tachyarrhythmias or bradycardia arrhythmias.  No atrial fibrillation or flutter.   CARDIOPULMONARY EXERCISE STRESS TEST (CPX)  10/27/2021   Good effort despite dyspnea and wheezing.  Modified Naughton protocol on treadmill.  Normal heart rate response.  No sustained arrhythmias.  Preexercise spirometry normal.  Low normal peak VO2. Low normal functional capacity with NO Clear Cardiopulmonary Limitation.  Overall limitation primarily due to obesity & deconditioning, along with a component of Diastolic Dysfunction   CHOLECYSTECTOMY  2007   COLONOSCOPY     CT CTA CORONARY W/CA SCORE W/CM &/OR WO/CM  09/21/2019   Coronary Calcium Score 0.  Normal RCA dominant coronary anatomy.  CAD RADS 1-minimal nonobstructive CAD (0-24%).  Consider  nonatherosclerotic cause for chest pain.  Consider preventative therapy and risk factor modification.   DILATION AND CURETTAGE OF UTERUS  yrs ago   ESOPHAGOGASTRODUODENOSCOPY (EGD) WITH PROPOFOL  03/01/2012   Procedure: ESOPHAGOGASTRODUODENOSCOPY (EGD) WITH PROPOFOL;  Surgeon: Willis Modena, MD;  Location: [facility];  Service: Endoscopy;  Laterality: N/A;   ESOPHAGOGASTRODUODENOSCOPY (EGD) WITH PROPOFOL N/A 01/31/2013   Procedure: ESOPHAGOGASTRODUODENOSCOPY (EGD) WITH PROPOFOL;  Surgeon: Willis Modena, MD;  Location: [facility];  Service: Endoscopy;  Laterality: N/A;   ESOPHAGOGASTRODUODENOSCOPY (EGD) WITH PROPOFOL N/A 09/02/2015   Procedure: ESOPHAGOGASTRODUODENOSCOPY (EGD) WITH PROPOFOL;  Surgeon: Iva Boop, MD;  Location: [facility];  Service: Endoscopy;  Laterality: N/A;   FRACTURE SURGERY     GASTRIC BY-PASS  2004   HERNIA REPAIR  2005   INSERTION OF VENA CAVA FILTER  01/17/2013   inserted 2004- "abdomen"   LIGAMENT REPAIR Right 1987   Rt. knee scope   MAXIMUM ACCESS (MAS)POSTERIOR LUMBAR INTERBODY FUSION (PLIF) 2 LEVEL N/A 06/07/2014   Procedure: L4-5 L5-S1 FOR MAXIMUM ACCESS (MAS) POSTERIOR  LUMBAR INTERBODY FUSION ;  Surgeon: Maeola Harman, MD;  Location: [facility];  Service: Neurosurgery;  Laterality: N/A;  L4-5 L5-S1 FOR MAXIMUM ACCESS (MAS) POSTERIOR LUMBAR INTERBODY FUSION    SPINE SURGERY  2017   TONSILLECTOMY     as child   TRANSTHORACIC ECHOCARDIOGRAM  12/2020   EF 55 to 60%.  No RWMA.  Mild LVH.  GR 1 DD-moderate LA dilation..  Mild MR..  Normal RV size/function.  Normal PAP.  Normal RAP. => Essentially stable from December 2020     Current Outpatient Medications:    albuterol (PROVENTIL) (2.5 MG/3ML) 0.083% nebulizer solution, Take 3 mLs (2.5 mg total) by nebulization every 6 (six) hours as needed for wheezing or shortness of breath., Disp: 100 mL, Rfl: 1   albuterol (VENTOLIN HFA) 108 (90 Base) MCG/ACT inhaler, INHALE 2 PUFFS INTO THE LUNGS EVERY 6 HOURS AS  NEEDED FOR WHEEZING OR SHORTNESS OF BREATH, Disp: 3 each, Rfl: 2   amLODipine (NORVASC) 10 MG tablet, Take 1 tablet (10 mg total) by mouth daily., Disp: 90 tablet, Rfl: 3   Ascorbic Acid (VITAMIN C) 1000 MG tablet, Take 1,000 mg by mouth daily., Disp: , Rfl:    aspirin EC 81 MG tablet, Take 1 tablet (81 mg total) by mouth daily. Swallow whole., Disp: 30 tablet, Rfl: 12   buPROPion (WELLBUTRIN XL) 300 MG 24 hr tablet, Take 1 tablet (300 mg total) by mouth daily., Disp: 90 tablet, Rfl: 3   ferrous sulfate 325 (65 FE) MG tablet, Take 1 tablet (325 mg total) by mouth daily with breakfast., Disp: 30 tablet, Rfl: 3   fluticasone (FLONASE) 50 MCG/ACT nasal spray, Place 2 sprays into both nostrils daily., Disp: 16 g, Rfl: 6   Fluticasone-Umeclidin-Vilant (TRELEGY ELLIPTA) 100-62.5-25 MCG/ACT AEPB, Inhale 1 puff into the lungs daily., Disp: 60 each, Rfl: 11   gabapentin (NEURONTIN) 100 MG capsule, Take 2 capsules (200 mg total) by mouth 2 (two) times daily., Disp: , Rfl:    ipratropium (ATROVENT) 0.02 % nebulizer solution, Take 2.5 mLs (0.5 mg total) by nebulization every 6 (six) hours as needed for wheezing or shortness of breath., Disp: 75 mL, Rfl: 12   lipase/protease/amylase (CREON) 36000 UNITS CPEP capsule, Take 36,000 Units by mouth 4 (four) times daily as needed (Stomach Pain)., Disp: , Rfl:    loperamide (IMODIUM) 2 MG capsule, Take 1 capsule (2 mg total) by mouth as needed for diarrhea or loose stools., Disp: 30 capsule, Rfl: 0   MAGNESIUM PO, Take 1 tablet by mouth daily., Disp: , Rfl:    montelukast (SINGULAIR) 10 MG tablet, Take 1 tablet (10 mg total) by mouth at bedtime., Disp: 90 tablet, Rfl: 3   Multiple Vitamin (MULTI-VITAMIN) tablet, Take 1 tablet by mouth daily., Disp: , Rfl:    ondansetron (ZOFRAN) 4 MG tablet, Take 1 tablet (4 mg total) by mouth every 8 (eight) hours as needed for nausea or vomiting., Disp: 30 tablet, Rfl: 0   pantoprazole (PROTONIX) 40 MG tablet, Take 1 tablet (40 mg  total) by mouth 2 (two) times daily before a meal., Disp: 60 tablet, Rfl: 3   PARoxetine (PAXIL) 20 MG tablet, Take 1 tablet (20 mg total) by mouth daily., Disp: 90 tablet, Rfl: 3   psyllium (HYDROCIL/METAMUCIL) 95 % PACK, Take 1 packet by mouth 2 (two) times daily. (Patient taking differently: Take 1 packet by mouth 2 (two) times daily as needed for mild constipation.), Disp: 240 each, Rfl:    torsemide (DEMADEX) 10 MG  tablet, Take 1 tablet (10 mg total) by mouth daily., Disp: 90 tablet, Rfl: 3   traMADol (ULTRAM) 50 MG tablet, Take 1 tablet (50 mg total) by mouth 2 (two) times daily., Disp: , Rfl:    traZODone (DESYREL) 50 MG tablet, Take 1 tablet (50 mg total) by mouth at bedtime as needed for sleep., Disp: 90 tablet, Rfl: 1   VITAMIN D PO, Take 1 capsule by mouth daily., Disp: , Rfl:  Allergies  Allergen Reactions   Carafate [Sucralfate] Hives, Itching and Other (See Comments)    Angioedema      Sulfonamide Derivatives Rash    Syncope    Social History   Socioeconomic History   Marital status: Widowed    Spouse name: Not on file   Number of children: 0   Years of education: college   Highest education level: Master's degree (e.g., MA, MS, MEng, MEd, MSW, MBA)  Occupational History   Occupation: retired  Tobacco Use   Smoking status: Former    Current packs/day: 0.00    Average packs/day: 0.5 packs/day for 10.0 years (5.0 ttl pk-yrs)    Types: Cigarettes    Start date: 04/20/1999    Quit date: 04/19/2009    Years since quitting: 14.0   Smokeless tobacco: Never  Vaping Use   Vaping status: Never Used  Substance and Sexual Activity   Alcohol use: Not Currently    Comment: occ. social- wine-1 drink monthly   Drug use: No   Sexual activity: Not Currently  Other Topics Concern   Not on file  Social History Narrative   Widow.  No kids.  Lives with her mother who is 39.  She does walk with a cane.  She is a retired Pharmacist, community   She quit smoking in 2003   Social  Drivers of Health   Financial Resource Strain: High Risk (04/01/2023)   Overall Financial Resource Strain (CARDIA)    Difficulty of Paying Living Expenses: Hard  Food Insecurity: No Food Insecurity (05/05/2023)   Hunger Vital Sign    Worried About Running Out of Food in the Last Year: Never true    Ran Out of Food in the Last Year: Never true  Recent Concern: Food Insecurity - Food Insecurity Present (04/01/2023)   Hunger Vital Sign    Worried About Programme researcher, broadcasting/film/video in the Last Year: Often true    Ran Out of Food in the Last Year: Often true  Transportation Needs: No Transportation Needs (04/27/2023)   Received from [employer]    In the past 12 months, has lack of reliable transportation kept you from medical appointments, meetings, work or from getting things needed for daily living? : No  Recent Concern: Transportation Needs - Unmet Transportation Needs (04/01/2023)   PRAPARE - Administrator, Civil Service (Medical): Yes    Lack of Transportation (Non-Medical): Yes  Physical Activity: Inactive (04/01/2023)   Exercise Vital Sign    Days of Exercise per Week: 0 days    Minutes of Exercise per Session: 0 min  Stress: No Stress Concern Present (04/01/2023)   [employer] of Occupational Health - Occupational Stress Questionnaire    Feeling of Stress : Only a little  Recent Concern: Stress - Stress Concern Present (02/14/2023)   [employer] of Occupational Health - Occupational Stress Questionnaire    Feeling of Stress : To some extent  Social Connections: Moderately Integrated (04/01/2023)   Social Connection and Isolation Panel [  NHANES]    Frequency of Communication with Friends and Family: More than three times a week    Frequency of Social Gatherings with Friends and Family: Once a week    Attends Religious Services: More than 4 times per year    Active Member of [employer] or Organizations: Yes    Attends Banker Meetings: More  than 4 times per year    Marital Status: Widowed  Intimate Partner Violence: Not At Risk (05/05/2023)   Humiliation, Afraid, Rape, and Kick questionnaire    Fear of Current or Ex-Partner: No    Emotionally Abused: No    Physically Abused: No    Sexually Abused: No    Family History  Problem Relation Age of Onset   Diabetes Mother    Atrial fibrillation Mother    Hypertension Mother    Arthritis Mother    Heart disease Father        Unsure of details   Hypertension Father    Prostate cancer Father    Alzheimer's disease Father    Arthritis Father    Cancer Father    Kidney disease Brother    Cancer Brother    Healthy Sister    Diabetes Maternal Grandmother    Heart disease Maternal Grandmother    Vision loss Maternal Grandmother    Stroke Maternal Grandfather 50   Hypertension Maternal Grandfather    Hypertension Paternal Grandmother    Alzheimer's disease Paternal Grandmother    Colon cancer Neg Hx    Colon polyps Neg Hx    Stomach cancer Neg Hx    Esophageal cancer Neg Hx    Pancreatic cancer Neg Hx    Liver disease Neg Hx      ROS: A complete ROS was performed with pertinent positives/negatives noted in the HPI. The remainder of the ROS are negative.    OBJECTIVE:  VITALS per patient if applicable: Today's Vitals   05/17/23 1045  Weight: 259 lb (117.5 kg)  Height: 5\' 5"  (1.651 m)   Body mass index is 43.1 kg/m.   GENERAL: Alert and oriented. Appears well and in no acute distress. HEENT: Atraumatic. Conjunctiva clear. No obvious abnormalities on inspection of external nose and ears. NECK: Normal movements of the head and neck. LUNGS: On inspection, no signs of respiratory distress. Breathing rate appears normal. No obvious gross SOB, gasping or wheezing, and no conversational dyspnea. CV: No obvious cyanosis. MS: Moves all visible extremities without noticeable abnormality. PSYCH/NEURO: Pleasant and cooperative. No obvious depression or anxiety. Speech  and thought processing grossly intact.  ASSESSMENT AND PLAN: 1. Moderate persistent asthma without complication Recommend patient switch from Advair to Trelegy for improvement in her asthma control and maintenance.  Patient will reach out to pulmonology to see if they have a sample for her to try of Trelegy prior to filling as our office and family medicine does not carry the samples.  Patient was agreeable to switching inhalers and will plan on follow-up as directed by PCP and pulmonology.  Will notify PCP if no improvement within 1 to 2 weeks.  Her symptoms are worsening. - Fluticasone-Umeclidin-Vilant (TRELEGY ELLIPTA) 100-62.5-25 MCG/ACT AEPB; Inhale 1 puff into the lungs daily.  Dispense: 60 each; Refill: 11  2. Class 3 severe obesity due to excess calories with serious comorbidity and body mass index (BMI) of 40.0 to 44.9 in adult (HCC) 3. OBSTRUCTIVE SLEEP APNEA (Primary) Patient would likely have significant benefit for asthma, OSA, fatigue with weight loss .  Discussed  possible benefits, adverse effects and side effects of GLP-1 medications with patient and patient was agreeable to proceed.  Patient has been cleared to start these from GI standpoint and will start Zepbound if insurance allows at 2.5 mg weekly for 4 weeks and then increase to 5 mg.  Will obtain CMP to check electrolyte, renal and hepatic function prior to starting these within the next week. - Comprehensive metabolic panel; Future   4. Gastroesophageal reflux disease, unspecified whether esophagitis present Currently well-controlled on pantoprazole 40 mg twice daily.  Will continue current regimen.  5. Iron Deficiency Patient will proceed with iron infusion per [facility] gastroenterology recommendations will continue to follow-up with them regarding repeat of iron and CBC.  Likely this is a contributing factor to patient's chronic fatigue.   I discussed the assessment and treatment plan with the patient. The  patient was provided an opportunity to ask questions and all were answered. The patient agreed with the plan and demonstrated an understanding of the instructions.   The patient was advised to call back or seek an in-person evaluation if the symptoms worsen or if the condition fails to improve as anticipated.  I provided 17 minutes of face-to-face time during this encounter.  Return for As scheduled w/ PCP.  Hilbert Bible, [place]

## 2023-05-18 ENCOUNTER — Encounter (HOSPITAL_BASED_OUTPATIENT_CLINIC_OR_DEPARTMENT_OTHER): Payer: Self-pay | Admitting: Family Medicine

## 2023-05-18 ENCOUNTER — Telehealth: Payer: Self-pay | Admitting: *Deleted

## 2023-05-18 ENCOUNTER — Other Ambulatory Visit (HOSPITAL_BASED_OUTPATIENT_CLINIC_OR_DEPARTMENT_OTHER): Payer: Self-pay | Admitting: Family Medicine

## 2023-05-18 ENCOUNTER — Ambulatory Visit (INDEPENDENT_AMBULATORY_CARE_PROVIDER_SITE_OTHER): Payer: Medicare HMO

## 2023-05-18 ENCOUNTER — Other Ambulatory Visit: Payer: Self-pay | Admitting: *Deleted

## 2023-05-18 DIAGNOSIS — Z0283 Encounter for blood-alcohol and blood-drug test: Secondary | ICD-10-CM

## 2023-05-18 DIAGNOSIS — R8290 Unspecified abnormal findings in urine: Secondary | ICD-10-CM

## 2023-05-18 DIAGNOSIS — R350 Frequency of micturition: Secondary | ICD-10-CM | POA: Diagnosis not present

## 2023-05-18 DIAGNOSIS — R7303 Prediabetes: Secondary | ICD-10-CM | POA: Diagnosis not present

## 2023-05-18 DIAGNOSIS — Z6841 Body Mass Index (BMI) 40.0 and over, adult: Secondary | ICD-10-CM | POA: Diagnosis not present

## 2023-05-18 DIAGNOSIS — E66813 Obesity, class 3: Secondary | ICD-10-CM | POA: Diagnosis not present

## 2023-05-18 LAB — POCT URINALYSIS DIP (CLINITEK)
Blood, UA: NEGATIVE
Glucose, UA: NEGATIVE mg/dL
Ketones, POC UA: NEGATIVE mg/dL
Leukocytes, UA: NEGATIVE
Nitrite, UA: NEGATIVE
POC PROTEIN,UA: 30 — AB
Spec Grav, UA: >=1.03 — AB (ref 1.010–1.025)
Urobilinogen, UA: 1 U/dL
pH, UA: 6 (ref 5.0–8.0)

## 2023-05-18 LAB — POCT URINE DRUG SCREEN
Methylenedioxyamphetamine: NOT DETECTED
POC BENZODIAZEPINES UR: NOT DETECTED
POC Cocaine UR: NOT DETECTED
POC Marijuana UR: NOT DETECTED
POC Oxycodone UR: NOT DETECTED

## 2023-05-18 LAB — POCT UA - MICROALBUMIN
Creatinine, POC: 300 mg/dL
Microalbumin Ur, POC: 80 mg/L

## 2023-05-18 MED ORDER — NITROFURANTOIN MONOHYD MACRO 100 MG PO CAPS: 100.0000 mg | ORAL_CAPSULE | Freq: Two times a day (BID) | ORAL | 0 refills | Status: DC

## 2023-05-18 NOTE — Addendum Note (Signed)
Addended by: Carleene Mains A on: 05/18/2023 03:19 PM   Modules accepted: Orders

## 2023-05-18 NOTE — Progress Notes (Addendum)
.  nu   Patient is in office today for a nurse visit for  UDS & Micro albumin  & POC Urinalysis

## 2023-05-18 NOTE — Addendum Note (Signed)
Addended by: Carleene Mains A on: 05/18/2023 03:08 PM   Modules accepted: Orders

## 2023-05-18 NOTE — Progress Notes (Signed)
Patient came to office for UDS, urine albumin completion.  Patient needed refill fill of her tramadol and need to complete UDS and controlled substance agreement.  Agreement signed by patient and faxed into chart.  While here, patient reported strong odor to her urine and increased urinary frequency.  Patient gets recurrent UTIs and is usually treated with Macrobid.  Macrobid sent in for patient to start and if no improvement within 24 to 48 hours to reach out to PCP

## 2023-05-18 NOTE — Patient Outreach (Signed)
  Care Management   Follow Up Note   05/18/2023 Name: [name] MRN: [id number] DOB: 1955/08/17   Referred by: Hilbert Bible, FNP Reason for referral : Care Management (RNCM: ATTEMPT to Follow Up For Chronic Disease Management & Care Coordination Needs)   An unsuccessful telephone outreach was attempted today. The patient was referred to the case management team for assistance with care management and care coordination.   Follow Up Plan: The care management team will reach out to the patient again over the next 30 days.   Larey Brick, BSN RN [facility], [facility] RN Care Manager Direct Dial: [phone]  Fax: [phone]

## 2023-05-19 ENCOUNTER — Other Ambulatory Visit: Payer: Self-pay | Admitting: *Deleted

## 2023-05-19 ENCOUNTER — Telehealth: Payer: Self-pay | Admitting: *Deleted

## 2023-05-19 LAB — COMPREHENSIVE METABOLIC PANEL
ALT: 168 [IU]/L — ABNORMAL HIGH (ref 0–32)
AST: 577 [IU]/L (ref 0–40)
Albumin: 3.9 g/dL (ref 3.9–4.9)
Alkaline Phosphatase: 165 [IU]/L — ABNORMAL HIGH (ref 44–121)
BUN/Creatinine Ratio: 17 (ref 12–28)
BUN: 15 mg/dL (ref 8–27)
Bilirubin Total: 0.7 mg/dL (ref 0.0–1.2)
CO2: 20 mmol/L (ref 20–29)
Calcium: 8.8 mg/dL (ref 8.7–10.3)
Chloride: 110 mmol/L — ABNORMAL HIGH (ref 96–106)
Creatinine, Ser: 0.88 mg/dL (ref 0.57–1.00)
Globulin, Total: 2.4 g/dL (ref 1.5–4.5)
Glucose: 125 mg/dL — ABNORMAL HIGH (ref 70–99)
Potassium: 4.2 mmol/L (ref 3.5–5.2)
Sodium: 144 mmol/L (ref 134–144)
Total Protein: 6.3 g/dL (ref 6.0–8.5)
eGFR: 71 mL/min/{1.73_m2} (ref >59)

## 2023-05-19 NOTE — Telephone Encounter (Signed)
Dr. Celine Mans, Our office was contacted by the patient regarding a new inhaler her PCP Jerre Simon FNP) put her on recently, Trelegy 100.  She stated this is the cheapest inhaler for her and it is $211.  Her PCP asked her to contact us to see if we could give her some samples to see if it works for her before she pays this price for it.  I just wanted to let you know that I put 2 samples up front for her to pick up.   Thank you.

## 2023-05-20 NOTE — Progress Notes (Signed)
I have attempted to call patient and left a voicemail due to significantly elevated liver function levels.  I would like her to get some additional lab work completed and also I understand that she has a CT abdomen pelvis ordered by her GI with [facility].  If she returns to call, please ask her when she is scheduled for the CT abdomen.  If it is not within the next week, I would like to go ahead and get this scheduled for her and also go ahead and get this lab work completed.

## 2023-05-21 LAB — URINE CULTURE

## 2023-05-23 ENCOUNTER — Telehealth: Payer: Self-pay | Admitting: *Deleted

## 2023-05-23 ENCOUNTER — Ambulatory Visit: Payer: Self-pay | Admitting: Licensed Clinical Social Worker

## 2023-05-23 DIAGNOSIS — E1142 Type 2 diabetes mellitus with diabetic polyneuropathy: Secondary | ICD-10-CM | POA: Diagnosis not present

## 2023-05-23 DIAGNOSIS — J181 Lobar pneumonia, unspecified organism: Secondary | ICD-10-CM | POA: Diagnosis not present

## 2023-05-23 DIAGNOSIS — G4733 Obstructive sleep apnea (adult) (pediatric): Secondary | ICD-10-CM | POA: Diagnosis not present

## 2023-05-23 DIAGNOSIS — I110 Hypertensive heart disease with heart failure: Secondary | ICD-10-CM | POA: Diagnosis not present

## 2023-05-23 DIAGNOSIS — I951 Orthostatic hypotension: Secondary | ICD-10-CM | POA: Diagnosis not present

## 2023-05-23 DIAGNOSIS — I5032 Chronic diastolic (congestive) heart failure: Secondary | ICD-10-CM | POA: Diagnosis not present

## 2023-05-23 DIAGNOSIS — K8681 Exocrine pancreatic insufficiency: Secondary | ICD-10-CM | POA: Diagnosis not present

## 2023-05-23 DIAGNOSIS — K5730 Diverticulosis of large intestine without perforation or abscess without bleeding: Secondary | ICD-10-CM | POA: Diagnosis not present

## 2023-05-23 DIAGNOSIS — D649 Anemia, unspecified: Secondary | ICD-10-CM | POA: Diagnosis not present

## 2023-05-23 NOTE — Progress Notes (Signed)
Complex Care Management Care Guide Note  05/23/2023 Name: [name] MRN: [id number] DOB: [birth year]  [name] is a 68 y.o. year old female who is a primary care patient of Haywood Lasso, Shelton Silvas, FNP and is actively engaged with the care management team. I reached out to [name] by phone today to assist with scheduling  with the RN Case Manager.  Follow up plan: Unsuccessful telephone outreach attempt made. A HIPAA compliant phone message was left for the patient providing contact information and requesting a return call.  Gwenevere Ghazi  [facility]  [facility], [facility] Guide  Direct Dial: [phone]  Fax [phone]

## 2023-05-23 NOTE — Patient Outreach (Signed)
  Care Coordination   Follow Up Visit Note   05/23/2023 Name: [name] MRN: [id number] DOB: [birth year]  [name] is a 68 y.o. year old female who sees Caudle, Shelton Silvas, FNP for primary care. I spoke with  [name] by phone today.  What matters to the patients health and wellness today?  transportation    Goals Addressed             This Visit's Progress    Care Coordination Activities- BSW Plan of Care       Care Coordination Interventions: Patient stated that she was able to get her utility bill paid and that is no longer a concern. Patient stated that he sister provided transportation to her medical appointments and if something changes she will let the SW know. The Sw will follow up on this and offer other resources for the future if the patient wants it. 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 She has talked to her sister about living with her but they have not discussed it anymore. Sw will mail out a senior housing resources list  SW has a scheduled appointment to follow up with the patient on 06/09/2023 at 10:00 am         SDOH assessments and interventions completed:  Yes  SDOH Interventions Today    Flowsheet Row Most Recent Value  SDOH Interventions   Food Insecurity Interventions Intervention Not Indicated  Housing Interventions Intervention Not Indicated  Transportation Interventions Intervention Not Indicated  [Patient is getting help from family to appointments]  Utilities Interventions Intervention Not Indicated        Care Coordination Interventions:  Yes, provided  Interventions Today    Flowsheet Row Most Recent Value  General Interventions   General Interventions Discussed/Reviewed General Interventions Reviewed   [Patient is getting assistance from family for trnasportation]        Follow up plan: Follow up call scheduled for 06/09/2023 at 10:00 am    Encounter Outcome:  Patient Visit Completed  [name], PhD [facility], [facility] [place] Social Worker Direct Dial: [phone]  Fax: [phone]

## 2023-05-23 NOTE — Progress Notes (Signed)
Complex Care Management Care Guide Note  05/23/2023 Name: [name] MRN: [id number] DOB: [birth year]  [name] is a 68 y.o. year old female who is a primary care patient of Haywood Lasso, Shelton Silvas, FNP and is actively engaged with the care management team. I reached out to [name] by phone today to assist with re-scheduling  with the RN Case Manager.  Follow up plan: Telephone appointment with complex care management team member scheduled for:  05/26/23  Gwenevere Ghazi  [facility]  [facility], [facility] Guide  Direct Dial: [phone]  Fax [phone]

## 2023-05-23 NOTE — Patient Instructions (Addendum)
Visit Information  Thank you for taking time to visit with me today. Please don't hesitate to contact me if I can be of assistance to you.   Following are the goals we discussed today:   Goals Addressed             This Visit's Progress    Care Coordination Activities- BSW Plan of Care       Care Coordination Interventions: Patient stated that she was able to get her utility bill paid and that is no longer a concern. Patient stated that he sister provided transportation to her medical appointments and if something changes she will let the SW know. The Sw will follow up on this and offer other resources for the future if the patient wants it. 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 She has talked to her sister about living with her but they have not discussed it anymore. Sw will mail out a senior housing resources list  SW has a scheduled appointment to follow up with the patient on 06/09/2023 at 10:00 am         Our next appointment is by telephone on 06/09/2023 at 10:00am  Please call the care guide team at [phone] if you need to cancel or reschedule your appointment.   If you are experiencing a Mental Health or Behavioral Health Crisis or need someone to talk to, please call the Suicide and Crisis Lifeline: 988 go to [facility] Urgent [facility] [address], [place] [phone]) call 911  Patient verbalizes understanding of instructions and care plan provided today and agrees to view in MyChart. Active MyChart status and patient understanding of how to access instructions and care plan via MyChart confirmed with patient.     [name], PhD [facility], [facility] Social Worker Direct Dial:  [phone]  Fax: [phone]

## 2023-05-23 NOTE — Progress Notes (Signed)
No change needed following urine culture report.  Patient reports improvement of symptoms with Macrobid use.

## 2023-05-24 NOTE — Progress Notes (Signed)
Called [name], [place] with [facility]. She will return my call. Recent CMP results faxed over to make GI aware and to schedule CT Abd/Pelvis.

## 2023-05-25 DIAGNOSIS — E611 Iron deficiency: Secondary | ICD-10-CM | POA: Diagnosis not present

## 2023-05-26 ENCOUNTER — Encounter: Payer: Self-pay | Admitting: *Deleted

## 2023-05-26 ENCOUNTER — Other Ambulatory Visit: Payer: Self-pay | Admitting: *Deleted

## 2023-05-26 NOTE — Patient Outreach (Signed)
 Care Management   Visit Note  05/26/2023 Name: [name] MRN: [id number] DOB: 12-Feb-1956  Subjective: [name] is a 68 y.o. year old female who is a primary care patient of Knute, Thersia Bitters, FNP. The Care Management team was consulted for assistance.      Engaged with patient spoke with patient by telephone.    Goals Addressed             This Visit's Progress    RNCM Care Managment Expected Outcome: Monitor, Self-Manage and Reduced Symptoms of: CHF, Hypertension, DM, Depression       Current Barriers:  Knowledge Deficits related to plan of care for management of CHF, HTN, DMII, Depression, and Pulmonary Disease  Care Coordination needs related to Limited access to food and Lack of essential utilities - [employer]* Chronic Disease Management support and education needs related to CHF, HTN, DMII, Depression, and Pulmonary Disease  Lacks caregiver support Environmental Consultant barriers    RNCM Clinical Goal(s):  Patient will verbalize basic understanding of  CHF, HTN, DMII, and Depression disease process and self health management plan as evidenced by verbal explanation, recognizing symptoms and when to seek help, lifestyle modifications and daily monitoring take all medications exactly as prescribed and will call provider for medication related questions as evidenced by compliance with all medications attend all scheduled medical appointments: with primary care provider and specialist as evidenced by maintaining all scheduled appointments demonstrate Improved and Ongoing adherence to prescribed treatment plan for CHF, HTN, DMII, Depression, and Pulmonary Disease as evidenced by consistent medication compliance, symptom monitoring, continued lifestyle modifications, daily weights and continued self-management continue to work with RN Care Manager to address care management and care coordination needs related to  CHF, HTN, DMII, and Depression  as evidenced by adherence to CM Team Scheduled appointments work with community resource care guide to address needs related to  Lack of essential utilities - electricity* as evidenced by patient and/or community resource care guide support experience decrease in ED visits as evidenced by EMR review.  ED visits in in last 6 months = 3  through collaboration with RN Care manager, provider, and care team.   Interventions: Evaluation of current treatment plan related to  self management and patient's adherence to plan as established by provider   Heart Failure Interventions:  (Status:  Goal on track:  Yes.) Long Term Goal Provided education on low sodium diet Assessed need for readable accurate scales in home.  Provided education about placing scale on hard, flat surface Advised patient to weigh each morning after emptying bladder Discussed importance of daily weight and advised patient to weigh and record daily. Weighs daily, every morning. Today she is 255 lbs. Reviewed role of diuretics in prevention of fluid overload and management of heart failure. Patient denies any swelling. Discussed the importance of keeping all appointments with provider. Cardiology follow up on 07-12-2023 Screening for signs and symptoms of depression related to chronic disease state   Wt Readings from Last 3 Encounters:  04/04/23 254 lb 6.4 oz (115.4 kg)  03/31/23 254 lb (115.2 kg)  03/21/23 254 lb 8 oz (115.4 kg)    Diabetes Interventions:  (Status:  Goal on track:  Yes.) Long Term Goal Assessed patient's understanding of A1c goal: <6.5% Provided education to patient about basic DM disease process. No hypo/hyperglycemia noted. Reports reading Tuesday 177 . Reviewed medications with patient and discussed importance of medication adherence Counseled on importance of regular laboratory monitoring as prescribed  Discussed plans with patient for ongoing care management follow up and provided patient with direct  contact information for care management team Lab Results  Component Value Date   HGBA1C 5.9 (H) 01/21/2023    Depression  (Status:  Goal on track:  Yes.)  Long Term Goal Evaluation of current treatment plan related to Depression, Mental Health Concerns  self-management and patient's adherence to plan as established by provider. Discussed plans with patient for ongoing care management follow up and provided patient with direct contact information for care management team Provided education about importance of maintaining regular sleep scheduled and creating a restful environment Reports seeing LCSW biweekly.   Hypertension Interventions:  (Status:  Goal on track:  Yes.) Long Term Goal Last practice recorded BP readings:  BP Readings from Last 3 Encounters:  05/09/23 114/80  04/04/23 134/77  03/21/23 129/64  03-16-2023: 149/88 Most recent eGFR/CrCl:  Lab Results  Component Value Date   EGFR 71 05/18/2023    No components found for: CRCL  Evaluation of current treatment plan related to hypertension self management and patient's adherence to plan as established by provider. Checks BP often, reports reading today of 136/80. Provided education to patient re: stroke prevention, s/s of heart attack and stroke Reviewed medications with patient and discussed importance of compliance. Reports compliance with all medications Counseled on adverse effects of illicit drug and excessive alcohol  use in patients with high blood pressure  Discussed plans with patient for ongoing care management follow up and provided patient with direct contact information for care management team Discussed complications of poorly controlled blood pressure such as heart disease, stroke, circulatory complications, vision complications, kidney impairment, sexual dysfunction   Patient Goals/Self-Care Activities: Take all medications as prescribed Attend all scheduled provider appointments Call pharmacy for  medication refills 3-7 days in advance of running out of medications Attend church or other social activities Call provider office for new concerns or questions  call the Suicide and Crisis Lifeline: 988 call the [place]  National Suicide Prevention Lifeline: (731)842-5033 or TTY: 2138612803 TTY (479) 011-6011) to talk to a trained counselor call 1-800-273-TALK (toll free, 24 hour hotline) go to [facility] [address], [place] [phone]) call 911 if experiencing a Mental Health or Behavioral Health Crisis  identify and remove indoor air pollutants do breathing exercises every day begin a symptom diary eliminate symptom triggers at home write blood pressure results in a log or diary call doctor for signs and symptoms of high blood pressure  Follow Up Plan:  Telephone follow up appointment with care management team member scheduled for:  06-28-2023 at 2:30 pm           Consent to Services:  Patient was given information about care management services, agreed to services, and gave verbal consent to participate.   Plan: Telephone follow up appointment with care management team member scheduled for:06-28-2023 at 2:30 pm  Rosina Sicard, BSN RN [facility], [facility] RN Care Manager Direct Dial: [phone]  Fax: [phone]

## 2023-05-26 NOTE — Patient Instructions (Signed)
 Visit Information  Thank you for taking time to visit with me today. Please don't hesitate to contact me if I can be of assistance to you before our next scheduled telephone appointment.  Following are the goals we discussed today:   Goals Addressed             This Visit's Progress    RNCM Care Managment Expected Outcome: Monitor, Self-Manage and Reduced Symptoms of: CHF, Hypertension, DM, Depression       Current Barriers:  Knowledge Deficits related to plan of care for management of CHF, HTN, DMII, Depression, and Pulmonary Disease  Care Coordination needs related to Limited access to food and Lack of essential utilities - [employer]* Chronic Disease Management support and education needs related to CHF, HTN, DMII, Depression, and Pulmonary Disease  Lacks caregiver support Environmental Consultant barriers    RNCM Clinical Goal(s):  Patient will verbalize basic understanding of  CHF, HTN, DMII, and Depression disease process and self health management plan as evidenced by verbal explanation, recognizing symptoms and when to seek help, lifestyle modifications and daily monitoring take all medications exactly as prescribed and will call provider for medication related questions as evidenced by compliance with all medications attend all scheduled medical appointments: with primary care provider and specialist as evidenced by maintaining all scheduled appointments demonstrate Improved and Ongoing adherence to prescribed treatment plan for CHF, HTN, DMII, Depression, and Pulmonary Disease as evidenced by consistent medication compliance, symptom monitoring, continued lifestyle modifications, daily weights and continued self-management continue to work with RN Care Manager to address care management and care coordination needs related to  CHF, HTN, DMII, and Depression as evidenced by adherence to CM Team Scheduled appointments work with community resource care guide to  address needs related to  Lack of essential utilities - electricity* as evidenced by patient and/or community resource care guide support experience decrease in ED visits as evidenced by EMR review.  ED visits in in last 6 months = 3  through collaboration with RN Care manager, provider, and care team.   Interventions: Evaluation of current treatment plan related to  self management and patient's adherence to plan as established by provider   Heart Failure Interventions:  (Status:  Goal on track:  Yes.) Long Term Goal Provided education on low sodium diet Assessed need for readable accurate scales in home.  Provided education about placing scale on hard, flat surface Advised patient to weigh each morning after emptying bladder Discussed importance of daily weight and advised patient to weigh and record daily. Weighs daily, every morning. Today she is 255 lbs. Reviewed role of diuretics in prevention of fluid overload and management of heart failure. Patient denies any swelling. Discussed the importance of keeping all appointments with provider. Cardiology follow up on 07-12-2023 Screening for signs and symptoms of depression related to chronic disease state   Wt Readings from Last 3 Encounters:  04/04/23 254 lb 6.4 oz (115.4 kg)  03/31/23 254 lb (115.2 kg)  03/21/23 254 lb 8 oz (115.4 kg)    Diabetes Interventions:  (Status:  Goal on track:  Yes.) Long Term Goal Assessed patient's understanding of A1c goal: <6.5% Provided education to patient about basic DM disease process. No hypo/hyperglycemia noted. Reports reading Tuesday 177 . Reviewed medications with patient and discussed importance of medication adherence Counseled on importance of regular laboratory monitoring as prescribed Discussed plans with patient for ongoing care management follow up and provided patient with direct contact information for care management  team Lab Results  Component Value Date   HGBA1C 5.9 (H) 01/21/2023     Depression  (Status:  Goal on track:  Yes.)  Long Term Goal Evaluation of current treatment plan related to Depression, Mental Health Concerns  self-management and patient's adherence to plan as established by provider. Discussed plans with patient for ongoing care management follow up and provided patient with direct contact information for care management team Provided education about importance of maintaining regular sleep scheduled and creating a restful environment Reports seeing LCSW biweekly.   Hypertension Interventions:  (Status:  Goal on track:  Yes.) Long Term Goal Last practice recorded BP readings:  BP Readings from Last 3 Encounters:  05/09/23 114/80  04/04/23 134/77  03/21/23 129/64  03-16-2023: 149/88 Most recent eGFR/CrCl:  Lab Results  Component Value Date   EGFR 71 05/18/2023    No components found for: CRCL  Evaluation of current treatment plan related to hypertension self management and patient's adherence to plan as established by provider. Checks BP often, reports reading today of 136/80. Provided education to patient re: stroke prevention, s/s of heart attack and stroke Reviewed medications with patient and discussed importance of compliance. Reports compliance with all medications Counseled on adverse effects of illicit drug and excessive alcohol  use in patients with high blood pressure  Discussed plans with patient for ongoing care management follow up and provided patient with direct contact information for care management team Discussed complications of poorly controlled blood pressure such as heart disease, stroke, circulatory complications, vision complications, kidney impairment, sexual dysfunction   Patient Goals/Self-Care Activities: Take all medications as prescribed Attend all scheduled provider appointments Call pharmacy for medication refills 3-7 days in advance of running out of medications Attend church or other social activities Call  provider office for new concerns or questions  call the Suicide and Crisis Lifeline: 988 call the [place]  National Suicide Prevention Lifeline: 2547985854 or TTY: (947)086-1407 TTY (331)561-6204) to talk to a trained counselor call 1-800-273-TALK (toll free, 24 hour hotline) go to [facility]gent Care [address] [place] [phone]) call 911 if experiencing a Mental Health or Behavioral Health Crisis  identify and remove indoor air pollutants do breathing exercises every day begin a symptom diary eliminate symptom triggers at home write blood pressure results in a log or diary call doctor for signs and symptoms of high blood pressure  Follow Up Plan:  Telephone follow up appointment with care management team member scheduled for:  06-28-2023 at 2:30 pm           Our next appointment is by telephone on 06-28-2023 at 2:30 pm  Please call the care guide team at [phone] if you need to cancel or reschedule your appointment.   If you are experiencing a Mental Health or Behavioral Health Crisis or need someone to talk to, please call the Suicide and Crisis Lifeline: 988 call the [place]  National Suicide Prevention Lifeline: 818 480 2505 or TTY: 6065452481 TTY 7064156487) to talk to a trained counselor call 1-800-273-TALK (toll free, 24 hour hotline) go to [facility]gent Care [address], [place] [phone])   Patient verbalizes understanding of instructions and care plan provided today and agrees to view in MyChart. Active MyChart status and patient understanding of how to access instructions and care plan via MyChart confirmed with patient.     Telephone follow up appointment with care management team member scheduled for:06-28-2023 at 2:30 pm  Rosina Sicard, BSN RN [facility]  Institute, It Trainer Dial: [phone]  Fax: [phone]

## 2023-05-27 ENCOUNTER — Ambulatory Visit: Payer: Medicare HMO | Admitting: Licensed Clinical Social Worker

## 2023-05-27 NOTE — Progress Notes (Deleted)
 [facility] Counselor/Therapist Progress Note  Patient ID: HAILEYANN STAIGER, MRN: [id number]    Date: 05/27/23  Time Spent: ***  {LBBHAMPM:26719} - *** {LBBHAMPM:26719} : *** Minutes  Treatment Type: Individual Therapy.  Reported Symptoms: ***  Mental Status Exam: Appearance:  {PSY:22683}     Behavior: {PSY:21022743}  Motor: {PSY:22302}  Speech/Language:  {PSY:22685}  Affect: {PSY:22687}  Mood: {PSY:31886}  Thought process: {PSY:31888}  Thought content:   {PSY:716-731-8159}  Sensory/Perceptual disturbances:   {[phone]}  Orientation: {PSY:30297}  Attention: {PSY:22877}  Concentration: {PSY:336 098 5101}  Memory: {PSY:717-128-9673}  Fund of knowledge:  {PSY:336 098 5101}  Insight:   {PSY:336 098 5101}  Judgment:  {PSY:336 098 5101}  Impulse Control: {PSY:336 098 5101}   Risk Assessment: Danger to Self:  {PSY:22692} Self-injurious Behavior: {PSY:22692} Danger to Others: {PSY:22692} Duty to Warn:{PSY:311194} Physical Aggression / Violence:{PSY:21197} Access to Firearms a concern: {PSY:21197} Gang Involvement:{PSY:21197}  Subjective:   Comer DELENA Hines participated from {Patient Location:[address]::home}, via {LBBHVIDEOORPHONE:26720}, and consented to treatment. Therapist participated from {LBBHPROVIDERLOCATION:26721}. We met online due to COVID pandemic.   ***   Interventions: {[phone]}  Diagnosis: No diagnosis found.   Plan: ***Patient is to use CBT, mindfulness and coping skills to help manage decrease symptoms associated with their diagnosis.   Long-term goal:   ***Reduce overall level, frequency, and intensity of the feelings of depression, anxiety and panic evidenced by       decreased irritability, negative self talk, and helpless feelings from 6 to 7 days/week to 0 to 1 days/week per client report for at least 3 consecutive months.  Short-term goal:  ***Verbally express understanding of the relationship between feelings of depression,  anxiety and their impact on thinking patterns and behaviors. Verbalize an understanding of the role that distorted thinking plays in creating fears, excessive worry, and ruminations.  Damien Junk MSW, LCSW/DATE

## 2023-05-30 DIAGNOSIS — E611 Iron deficiency: Secondary | ICD-10-CM | POA: Diagnosis not present

## 2023-05-30 DIAGNOSIS — R748 Abnormal levels of other serum enzymes: Secondary | ICD-10-CM | POA: Diagnosis not present

## 2023-05-31 NOTE — Progress Notes (Signed)
Clinician made virtual session call to patient at the assigned appointment time. Patient was in bed and lethargic. Patient stated that she thinks it would be best to reschedule.

## 2023-06-07 ENCOUNTER — Other Ambulatory Visit: Payer: Self-pay | Admitting: Internal Medicine

## 2023-06-07 ENCOUNTER — Other Ambulatory Visit: Payer: Self-pay | Admitting: Family Medicine

## 2023-06-07 DIAGNOSIS — D509 Iron deficiency anemia, unspecified: Secondary | ICD-10-CM | POA: Diagnosis not present

## 2023-06-07 DIAGNOSIS — R1032 Left lower quadrant pain: Secondary | ICD-10-CM | POA: Diagnosis not present

## 2023-06-08 ENCOUNTER — Encounter (HOSPITAL_BASED_OUTPATIENT_CLINIC_OR_DEPARTMENT_OTHER): Payer: Self-pay | Admitting: Family Medicine

## 2023-06-08 NOTE — Telephone Encounter (Signed)
 Please see mychart sent by pt and advise.

## 2023-06-09 ENCOUNTER — Ambulatory Visit: Payer: Self-pay | Admitting: Licensed Clinical Social Worker

## 2023-06-09 NOTE — Patient Outreach (Addendum)
  Care Coordination   Follow Up Visit Note   06/09/2023 Name: [name] MRN: [id number] DOB: January 09, 1956  [name] is a 68 y.o. year old female who sees [name], Jessica Silvas, FNP for primary care. I spoke with  [name] by phone today.  What matters to the patients health and wellness today?  Housing and SDOH needs    Goals Addressed             This Visit's Progress    Care Coordination Activities- BSW Plan of Care       Care Coordination Interventions: Patient stated that things are going well, she has food but would like to know if any of the food banks deliver if she ever runs low, the SW explained that most of them don't and those that do it is done during the holidays.  Patient stated that her sister is providing transportation for her medical and other places that she needs to go. Patient stated that her electric bill was over $400 for one month but she did call the electric company to discuss and to have it explained and it was stated due to the colder night and the system working harder and she stated that the [employer] also mailed her a Pharmacist, hospital, but she said that it is the same list the SW provided her. SW will follow up 06/30/2023 at 10:00 am         SDOH assessments and interventions completed:  Yes  SDOH Interventions Today    Flowsheet Row Most Recent Value  SDOH Interventions   Food Insecurity Interventions Intervention Not Indicated  Housing Interventions Other (Comment)  [SW mailed out senior living resources for the patients possible move]  Transportation Interventions Community Resources Provided  [Patients sister provides transportation, Patient wants the [facility] Application mailed, SW will mail]  Utilities Interventions Intervention Not Indicated        Care Coordination Interventions:  Yes, provided  Interventions Today    Flowsheet Row Most Recent Value  General Interventions   General Interventions  Discussed/Reviewed --  [SW will mail out information about the [facility] and application]        Follow up plan: Follow up call scheduled for 06/30/2023 at 10:00 am    Encounter Outcome:  Patient Visit Completed  Jessica Cooks, PhD [facility], [facility] Social Worker Direct Dial: [phone]  Fax: [phone]

## 2023-06-09 NOTE — Patient Instructions (Signed)
Visit Information  Thank you for taking time to visit with me today. Please don't hesitate to contact me if I can be of assistance to you.   Following are the goals we discussed today:   Goals Addressed             This Visit's Progress    Care Coordination Activities- BSW Plan of Care       Care Coordination Interventions: Patient stated that things are going well, she has food but would like to know if any of the food banks deliver if she ever runs low, the SW explained that most of them don't and those that do it is done during the holidays.  Patient stated that her sister is providing transportation for her medical and other places that she needs to go. Patient stated that her electric bill was over $400 for one month but she did call the electric company to discuss and to have it explained and it was stated due to the colder night and the system working harder and she stated that the [employer] also mailed her a Pharmacist, hospital, but she said that it is the same list the SW provided her. SW will follow up 06/30/2023 at 10:00 am         Our next appointment is by telephone on 06/30/2023 at 10:00 am  Please call the care guide team at [phone] if you need to cancel or reschedule your appointment.   If you are experiencing a Mental Health or Behavioral Health Crisis or need someone to talk to, please call the Suicide and Crisis Lifeline: 988 go to [facility] [address] [place] [phone]) call 911  Patient verbalizes understanding of instructions and care plan provided today and agrees to view in MyChart. Active MyChart status and patient understanding of how to access instructions and care plan via MyChart confirmed with patient.     [name], PhD [facility], [facility] Social Worker Direct Dial: [phone]  Fax: [phone]

## 2023-06-13 NOTE — Telephone Encounter (Signed)
 [name], please see mychart message sent by pt.

## 2023-06-19 ENCOUNTER — Other Ambulatory Visit: Payer: Self-pay | Admitting: Internal Medicine

## 2023-06-19 ENCOUNTER — Other Ambulatory Visit: Payer: Self-pay | Admitting: Family Medicine

## 2023-06-20 ENCOUNTER — Other Ambulatory Visit (HOSPITAL_BASED_OUTPATIENT_CLINIC_OR_DEPARTMENT_OTHER): Payer: Self-pay | Admitting: Family Medicine

## 2023-06-20 DIAGNOSIS — R32 Unspecified urinary incontinence: Secondary | ICD-10-CM

## 2023-06-20 DIAGNOSIS — N281 Cyst of kidney, acquired: Secondary | ICD-10-CM | POA: Insufficient documentation

## 2023-06-20 DIAGNOSIS — N390 Urinary tract infection, site not specified: Secondary | ICD-10-CM

## 2023-06-22 ENCOUNTER — Ambulatory Visit (HOSPITAL_BASED_OUTPATIENT_CLINIC_OR_DEPARTMENT_OTHER): Admitting: Family Medicine

## 2023-06-22 ENCOUNTER — Encounter (HOSPITAL_BASED_OUTPATIENT_CLINIC_OR_DEPARTMENT_OTHER): Payer: Self-pay | Admitting: Family Medicine

## 2023-06-22 ENCOUNTER — Telehealth (HOSPITAL_BASED_OUTPATIENT_CLINIC_OR_DEPARTMENT_OTHER): Payer: Self-pay | Admitting: Family Medicine

## 2023-06-22 ENCOUNTER — Ambulatory Visit (INDEPENDENT_AMBULATORY_CARE_PROVIDER_SITE_OTHER)

## 2023-06-22 VITALS — BP 125/75 | HR 74 | Ht 65.0 in | Wt 261.0 lb

## 2023-06-22 DIAGNOSIS — R062 Wheezing: Secondary | ICD-10-CM | POA: Diagnosis not present

## 2023-06-22 DIAGNOSIS — N390 Urinary tract infection, site not specified: Secondary | ICD-10-CM | POA: Diagnosis not present

## 2023-06-22 DIAGNOSIS — R32 Unspecified urinary incontinence: Secondary | ICD-10-CM | POA: Diagnosis not present

## 2023-06-22 DIAGNOSIS — R5382 Chronic fatigue, unspecified: Secondary | ICD-10-CM

## 2023-06-22 DIAGNOSIS — R0609 Other forms of dyspnea: Secondary | ICD-10-CM | POA: Diagnosis not present

## 2023-06-22 DIAGNOSIS — R0602 Shortness of breath: Secondary | ICD-10-CM | POA: Diagnosis not present

## 2023-06-22 DIAGNOSIS — R059 Cough, unspecified: Secondary | ICD-10-CM | POA: Diagnosis not present

## 2023-06-22 LAB — POCT URINALYSIS DIP (CLINITEK)
Bilirubin, UA: NEGATIVE
Blood, UA: NEGATIVE
Glucose, UA: NEGATIVE mg/dL
Ketones, POC UA: NEGATIVE mg/dL
Leukocytes, UA: NEGATIVE
Nitrite, UA: NEGATIVE
Spec Grav, UA: 1.025 (ref 1.010–1.025)
Urobilinogen, UA: 0.2 U/dL
pH, UA: 6.5 (ref 5.0–8.0)

## 2023-06-22 MED ORDER — CEPHALEXIN 250 MG PO CAPS: 250.0000 mg | ORAL_CAPSULE | Freq: Four times a day (QID) | ORAL | 0 refills | Status: AC
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
## 2023-06-22 NOTE — Telephone Encounter (Signed)
 Primary care patient of Jerre Simon. Pt has an upcoming appt with pulmonary doctor Dr. Celine Mans. Per [name], if able, can we move pt's appt up sooner as she is having worsening breathing.  Routing to [place] as well as Hydrographic surveyor pool for assistance with this.

## 2023-06-22 NOTE — Telephone Encounter (Signed)
 Left voicemail for patient to call back to schedule sooner

## 2023-06-23 ENCOUNTER — Encounter (HOSPITAL_BASED_OUTPATIENT_CLINIC_OR_DEPARTMENT_OTHER): Payer: Self-pay | Admitting: Family Medicine

## 2023-06-23 DIAGNOSIS — R0609 Other forms of dyspnea: Secondary | ICD-10-CM | POA: Insufficient documentation

## 2023-06-23 DIAGNOSIS — R32 Unspecified urinary incontinence: Secondary | ICD-10-CM | POA: Insufficient documentation

## 2023-06-23 NOTE — Progress Notes (Signed)
 Hi [name], Your chest x-ray is negative for signs of pleural effusion or pneumonia.  I am awaiting your blood work to return to decide whether or not to proceed with repeating your echocardiogram.  Pulmonology should be calling you again today to set up an appointment due to your increasing shortness of breath.  If you do not hear from them, please let me know

## 2023-06-23 NOTE — Telephone Encounter (Signed)
 [name], please see mychart sent by pt and advise.

## 2023-06-26 LAB — URINE CULTURE

## 2023-06-28 ENCOUNTER — Emergency Department (HOSPITAL_BASED_OUTPATIENT_CLINIC_OR_DEPARTMENT_OTHER): Admitting: Radiology

## 2023-06-28 ENCOUNTER — Other Ambulatory Visit: Payer: Self-pay | Admitting: *Deleted

## 2023-06-28 ENCOUNTER — Other Ambulatory Visit: Payer: Self-pay

## 2023-06-28 ENCOUNTER — Encounter (HOSPITAL_BASED_OUTPATIENT_CLINIC_OR_DEPARTMENT_OTHER): Payer: Self-pay | Admitting: Emergency Medicine

## 2023-06-28 ENCOUNTER — Emergency Department (HOSPITAL_BASED_OUTPATIENT_CLINIC_OR_DEPARTMENT_OTHER)

## 2023-06-28 ENCOUNTER — Encounter (HOSPITAL_BASED_OUTPATIENT_CLINIC_OR_DEPARTMENT_OTHER): Payer: Self-pay | Admitting: Family Medicine

## 2023-06-28 ENCOUNTER — Emergency Department (HOSPITAL_BASED_OUTPATIENT_CLINIC_OR_DEPARTMENT_OTHER)
Admission: EM | Admit: 2023-06-28 | Discharge: 2023-06-29 | Disposition: A | Discharge status: Home / Self Care | Attending: Emergency Medicine | Admitting: Emergency Medicine

## 2023-06-28 DIAGNOSIS — R918 Other nonspecific abnormal finding of lung field: Secondary | ICD-10-CM | POA: Diagnosis not present

## 2023-06-28 DIAGNOSIS — R1030 Lower abdominal pain, unspecified: Secondary | ICD-10-CM | POA: Insufficient documentation

## 2023-06-28 DIAGNOSIS — R0602 Shortness of breath: Secondary | ICD-10-CM | POA: Diagnosis not present

## 2023-06-28 DIAGNOSIS — I517 Cardiomegaly: Secondary | ICD-10-CM | POA: Diagnosis not present

## 2023-06-28 DIAGNOSIS — I509 Heart failure, unspecified: Secondary | ICD-10-CM | POA: Insufficient documentation

## 2023-06-28 DIAGNOSIS — K439 Ventral hernia without obstruction or gangrene: Secondary | ICD-10-CM | POA: Diagnosis not present

## 2023-06-28 DIAGNOSIS — I110 Hypertensive heart disease with heart failure: Secondary | ICD-10-CM | POA: Diagnosis not present

## 2023-06-28 DIAGNOSIS — Z79899 Other long term (current) drug therapy: Secondary | ICD-10-CM | POA: Diagnosis not present

## 2023-06-28 DIAGNOSIS — K5730 Diverticulosis of large intestine without perforation or abscess without bleeding: Secondary | ICD-10-CM | POA: Diagnosis not present

## 2023-06-28 DIAGNOSIS — R0989 Other specified symptoms and signs involving the circulatory and respiratory systems: Secondary | ICD-10-CM | POA: Diagnosis not present

## 2023-06-28 DIAGNOSIS — J40 Bronchitis, not specified as acute or chronic: Secondary | ICD-10-CM

## 2023-06-28 DIAGNOSIS — N281 Cyst of kidney, acquired: Secondary | ICD-10-CM | POA: Diagnosis not present

## 2023-06-28 DIAGNOSIS — Z7982 Long term (current) use of aspirin: Secondary | ICD-10-CM | POA: Insufficient documentation

## 2023-06-28 DIAGNOSIS — R197 Diarrhea, unspecified: Secondary | ICD-10-CM | POA: Diagnosis not present

## 2023-06-28 DIAGNOSIS — J9811 Atelectasis: Secondary | ICD-10-CM | POA: Diagnosis not present

## 2023-06-28 DIAGNOSIS — J45909 Unspecified asthma, uncomplicated: Secondary | ICD-10-CM | POA: Insufficient documentation

## 2023-06-28 LAB — CBC
HCT: 36.9 % (ref 36.0–46.0)
Hemoglobin: 11.6 g/dL — ABNORMAL LOW (ref 12.0–15.0)
MCH: 26.9 pg (ref 26.0–34.0)
MCHC: 31.4 g/dL (ref 30.0–36.0)
MCV: 85.6 fL (ref 80.0–100.0)
Platelets: 269 10*3/uL (ref 150–400)
RBC: 4.31 MIL/uL (ref 3.87–5.11)
RDW: 18.5 % — ABNORMAL HIGH (ref 11.5–15.5)
WBC: 6.7 10*3/uL (ref 4.0–10.5)
nRBC: 0 % (ref 0.0–0.2)

## 2023-06-28 LAB — COMPREHENSIVE METABOLIC PANEL
ALT: 46 U/L — ABNORMAL HIGH (ref 0–44)
AST: 31 U/L (ref 15–41)
Albumin: 3.8 g/dL (ref 3.5–5.0)
Alkaline Phosphatase: 136 U/L — ABNORMAL HIGH (ref 38–126)
Anion gap: 10 (ref 5–15)
BUN: 20 mg/dL (ref 8–23)
CO2: 23 mmol/L (ref 22–32)
Calcium: 8.5 mg/dL — ABNORMAL LOW (ref 8.9–10.3)
Chloride: 109 mmol/L (ref 98–111)
Creatinine, Ser: 0.92 mg/dL (ref 0.44–1.00)
GFR, Estimated: >60 mL/min (ref >60)
Glucose, Bld: 132 mg/dL — ABNORMAL HIGH (ref 70–99)
Potassium: 3.7 mmol/L (ref 3.5–5.1)
Sodium: 142 mmol/L (ref 135–145)
Total Bilirubin: 0.4 mg/dL (ref 0.0–1.2)
Total Protein: 6.7 g/dL (ref 6.5–8.1)

## 2023-06-28 LAB — RESP PANEL BY RT-PCR (RSV, FLU A&B, COVID)  RVPGX2
Influenza A by PCR: NEGATIVE
Influenza B by PCR: NEGATIVE
Resp Syncytial Virus by PCR: NEGATIVE
SARS Coronavirus 2 by RT PCR: NEGATIVE

## 2023-06-28 LAB — TROPONIN I (HIGH SENSITIVITY)
Troponin I (High Sensitivity): 4 ng/L (ref <18)
Troponin I (High Sensitivity): 4 ng/L (ref <18)

## 2023-06-28 LAB — BRAIN NATRIURETIC PEPTIDE: B Natriuretic Peptide: 108.2 pg/mL — ABNORMAL HIGH (ref 0.0–100.0)

## 2023-06-28 MED ORDER — MORPHINE SULFATE (PF) 4 MG/ML IV SOLN
4.0000 mg | Freq: Once | INTRAVENOUS | Status: AC
  Administered 2023-06-28: 4 mg via INTRAVENOUS
  Filled 2023-06-28: qty 1

## 2023-06-28 MED ORDER — IOHEXOL 350 MG/ML SOLN
100.0000 mL | Freq: Once | INTRAVENOUS | Status: AC | PRN
Start: 2023-06-28 — End: 2023-06-28
  Administered 2023-06-28: 100 mL via INTRAVENOUS

## 2023-06-28 MED ORDER — ALBUTEROL SULFATE (2.5 MG/3ML) 0.083% IN NEBU
5.0000 mg | INHALATION_SOLUTION | Freq: Once | RESPIRATORY_TRACT | Status: AC
  Administered 2023-06-28: 5 mg via RESPIRATORY_TRACT
  Filled 2023-06-28: qty 6

## 2023-06-28 MED ORDER — IPRATROPIUM BROMIDE 0.02 % IN SOLN
0.5000 mg | Freq: Once | RESPIRATORY_TRACT | Status: AC
  Administered 2023-06-28: 0.5 mg via RESPIRATORY_TRACT
  Filled 2023-06-28: qty 2.5

## 2023-06-28 MED ORDER — ONDANSETRON HCL 4 MG/2ML IJ SOLN
4.0000 mg | Freq: Once | INTRAMUSCULAR | Status: AC
  Administered 2023-06-28: 4 mg via INTRAVENOUS
  Filled 2023-06-28: qty 2

## 2023-06-28 NOTE — Patient Outreach (Signed)
 Care Management   Visit Note  06/28/2023 Name: [name] MRN: [id number] DOB: 1956-02-04  Subjective: [name] is a 68 y.o. year old female who is a primary care patient of Haywood Lasso, Shelton Silvas, FNP. The Care Management team was consulted for assistance.      Engaged with patient spoke with patient by telephone.    Goals Addressed             This Visit's Progress    RNCM Care Managment Expected Outcome: Monitor, Self-Manage and Reduced Symptoms of: CHF, Hypertension, DM, Depression       Current Barriers:  Knowledge Deficits related to plan of care for management of CHF, HTN, DMII, Depression, and Pulmonary Disease  Care Coordination needs related to Limited access to food and Lack of essential utilities - [employer]* Chronic Disease Management support and education needs related to CHF, HTN, DMII, Depression, and Pulmonary Disease  Lacks caregiver support Environmental consultant barriers    RNCM Clinical Goal(s):  Patient will verbalize basic understanding of  CHF, HTN, DMII, and Depression disease process and self health management plan as evidenced by verbal explanation, recognizing symptoms and when to seek help, lifestyle modifications and daily monitoring take all medications exactly as prescribed and will call provider for medication related questions as evidenced by compliance with all medications attend all scheduled medical appointments: with primary care provider and specialist as evidenced by maintaining all scheduled appointments demonstrate Improved and Ongoing adherence to prescribed treatment plan for CHF, HTN, DMII, Depression, and Pulmonary Disease as evidenced by consistent medication compliance, symptom monitoring, continued lifestyle modifications, daily weights and continued self-management continue to work with RN Care Manager to address care management and care coordination needs related to  CHF, HTN, DMII, and Depression  as evidenced by adherence to CM Team Scheduled appointments work with community resource care guide to address needs related to  Lack of essential utilities - electricity* as evidenced by patient and/or community resource care guide support experience decrease in ED visits as evidenced by EMR review.  ED visits in in last 6 months = 3  through collaboration with RN Care manager, provider, and care team.   Interventions: Evaluation of current treatment plan related to  self management and patient's adherence to plan as established by provider   Heart Failure Interventions:  (Status:  Goal on track:  Yes.) Long Term Goal Provided education on low sodium diet Assessed need for readable accurate scales in home.  Provided education about placing scale on hard, flat surface Advised patient to weigh each morning after emptying bladder Discussed importance of daily weight and advised patient to weigh and record daily. Weighs daily, every morning. Today she is 255 lbs and states that her weight is up 5 lbs Reviewed role of diuretics in prevention of fluid overload and management of heart failure. Patient denies any swelling in her lower extremities but noted to have labored breathing during call Discussed the importance of keeping all appointments with provider. Cardiology follow up on 07-12-2023 Screening for signs and symptoms of depression related to chronic disease state   Wt Readings from Last 3 Encounters:  06/22/23 261 lb (118.4 kg)  05/17/23 259 lb (117.5 kg)  05/09/23 259 lb 6.4 oz (117.7 kg)    Diabetes Interventions:  (Status:  Goal on track:  Yes.) Long Term Goal Assessed patient's understanding of A1c goal: <6.5% Provided education to patient about basic DM disease process. A1C goal met. Checks blood sugar often and  reports a fasting reading on Monday of 148. Denies any hypo/hyperglycemic episodes.Reviewed medications with patient and discussed importance of medication adherence. Reports  compliance with all medications. Counseled on importance of regular laboratory monitoring as prescribed Discussed plans with patient for ongoing care management follow up and provided patient with direct contact information for care management team Lab Results  Component Value Date   HGBA1C 5.9 (H) 01/21/2023    Depression  (Status:  Condition stable.  Not addressed this visit.)  Long Term Goal Evaluation of current treatment plan related to Depression, Mental Health Concerns  self-management and patient's adherence to plan as established by provider. Has worked with therapist in the past. Discussed plans with patient for ongoing care management follow up and provided patient with direct contact information for care management team Provided education about importance of maintaining regular sleep scheduled and creating a restful environment   Hypertension Interventions:  (Status:  Goal on track:  Yes.) Long Term Goal Last practice recorded BP readings:  BP Readings from Last 3 Encounters:  06/22/23 125/75  05/09/23 114/80  04/04/23 134/77   Most recent eGFR/CrCl:  Lab Results  Component Value Date   EGFR 71 05/18/2023    No components found for: "CRCL"  Evaluation of current treatment plan related to hypertension self management and patient's adherence to plan as established by provider. Denies any chest pain, dizziness or headaches at this time. Provided education to patient re: stroke prevention, s/s of heart attack and stroke. Education and support provided Reviewed medications with patient and discussed importance of compliance. Reports compliance with all medications Counseled on adverse effects of illicit drug and excessive alcohol use in patients with high blood pressure  Discussed plans with patient for ongoing care management follow up and provided patient with direct contact information for care management team Discussed complications of poorly controlled blood pressure such  as heart disease, stroke, circulatory complications, vision complications, kidney impairment, sexual dysfunction   Patient Goals/Self-Care Activities: Take all medications as prescribed Attend all scheduled provider appointments Call pharmacy for medication refills 3-7 days in advance of running out of medications Attend church or other social activities Call provider office for new concerns or questions  call the Suicide and Crisis Lifeline: 988 call the [place] National Suicide Prevention Lifeline: (412)140-5112 or TTY: 757-832-1313 TTY 779-522-8812) to talk to a trained counselor call 1-800-273-TALK (toll free, 24 hour hotline) go to [facility] [address], [place] [phone]) call 911 if experiencing a Mental Health or Behavioral Health Crisis  identify and remove indoor air pollutants do breathing exercises every day begin a symptom diary eliminate symptom triggers at home write blood pressure results in a log or diary call doctor for signs and symptoms of high blood pressure  Follow Up Plan:  Telephone follow up appointment with care management team member scheduled for:  07-26-2023 at 10:30 am           Consent to Services:  Patient was given information about care management services, agreed to services, and gave verbal consent to participate.   Plan: Telephone follow up appointment with care management team member scheduled for:07-26-2023 at 10:30 am  Larey Brick, BSN RN [facility], [facility] RN Care Manager Direct Dial: [phone]  Fax: [phone]

## 2023-06-28 NOTE — Progress Notes (Signed)
 Community care RN Danise Edge sent a message to PCP and cardiology Bernadene Person, NP regarding conversation with patient this morning.  RN was concerned about patient's shortness of breath during conversation, ongoing diarrhea, and recent increase of 5 pounds overnight in patient's weight.  I contacted patient with a telephone call this afternoon and discussed concerns.  Patient did have mild to moderate shortness of breath while on the phone during conversation.  She states she was feeling better after discussion with community care RN, but is still having significant fatigue.  Patient was seen by PCP within the past week for workup of shortness of breath and fatigue.  BNP, BMP and H&H were ordered, but unable to be drawn and patient was to return to have these done but was unable to get to the clinic.  Chest x-ray was unremarkable for possible pleural effusion or pneumonia.  Patient states she did take her torsemide this morning and usually voids approximately 5-6 times after taking, but was only able to go once today.  PCP is concerned regarding dehydration with ongoing diarrhea and history of C. difficile versus fluid overload and AKI.  PCP recommend patient go to the emergency department and patient was agreeable.  She states family member will drive her there now.  Cardiology NP updated

## 2023-06-28 NOTE — Patient Instructions (Signed)
 Visit Information  Thank you for taking time to visit with me today. Please don't hesitate to contact me if I can be of assistance to you before our next scheduled telephone appointment.  Following are the goals we discussed today:   Goals Addressed             This Visit's Progress    RNCM Care Managment Expected Outcome: Monitor, Self-Manage and Reduced Symptoms of: CHF, Hypertension, DM, Depression       Current Barriers:  Knowledge Deficits related to plan of care for management of CHF, HTN, DMII, Depression, and Pulmonary Disease  Care Coordination needs related to Limited access to food and Lack of essential utilities - [employer]* Chronic Disease Management support and education needs related to CHF, HTN, DMII, Depression, and Pulmonary Disease  Lacks caregiver support Environmental consultant barriers    RNCM Clinical Goal(s):  Patient will verbalize basic understanding of  CHF, HTN, DMII, and Depression disease process and self health management plan as evidenced by verbal explanation, recognizing symptoms and when to seek help, lifestyle modifications and daily monitoring take all medications exactly as prescribed and will call provider for medication related questions as evidenced by compliance with all medications attend all scheduled medical appointments: with primary care provider and specialist as evidenced by maintaining all scheduled appointments demonstrate Improved and Ongoing adherence to prescribed treatment plan for CHF, HTN, DMII, Depression, and Pulmonary Disease as evidenced by consistent medication compliance, symptom monitoring, continued lifestyle modifications, daily weights and continued self-management continue to work with RN Care Manager to address care management and care coordination needs related to  CHF, HTN, DMII, and Depression as evidenced by adherence to CM Team Scheduled appointments work with community resource care guide to  address needs related to  Lack of essential utilities - electricity* as evidenced by patient and/or community resource care guide support experience decrease in ED visits as evidenced by EMR review.  ED visits in in last 6 months = 3  through collaboration with RN Care manager, provider, and care team.   Interventions: Evaluation of current treatment plan related to  self management and patient's adherence to plan as established by provider   Heart Failure Interventions:  (Status:  Goal on track:  Yes.) Long Term Goal Provided education on low sodium diet Assessed need for readable accurate scales in home.  Provided education about placing scale on hard, flat surface Advised patient to weigh each morning after emptying bladder Discussed importance of daily weight and advised patient to weigh and record daily. Weighs daily, every morning. Today she is 255 lbs and states that her weight is up 5 lbs Reviewed role of diuretics in prevention of fluid overload and management of heart failure. Patient denies any swelling in her lower extremities but noted to have labored breathing during call Discussed the importance of keeping all appointments with provider. Cardiology follow up on 07-12-2023 Screening for signs and symptoms of depression related to chronic disease state   Wt Readings from Last 3 Encounters:  06/22/23 261 lb (118.4 kg)  05/17/23 259 lb (117.5 kg)  05/09/23 259 lb 6.4 oz (117.7 kg)    Diabetes Interventions:  (Status:  Goal on track:  Yes.) Long Term Goal Assessed patient's understanding of A1c goal: <6.5% Provided education to patient about basic DM disease process. A1C goal met. Checks blood sugar often and reports a fasting reading on Monday of 148. Denies any hypo/hyperglycemic episodes.Reviewed medications with patient and discussed importance of medication  adherence. Reports compliance with all medications. Counseled on importance of regular laboratory monitoring as  prescribed Discussed plans with patient for ongoing care management follow up and provided patient with direct contact information for care management team Lab Results  Component Value Date   HGBA1C 5.9 (H) 01/21/2023    Depression  (Status:  Condition stable.  Not addressed this visit.)  Long Term Goal Evaluation of current treatment plan related to Depression, Mental Health Concerns  self-management and patient's adherence to plan as established by provider. Has worked with therapist in the past. Discussed plans with patient for ongoing care management follow up and provided patient with direct contact information for care management team Provided education about importance of maintaining regular sleep scheduled and creating a restful environment   Hypertension Interventions:  (Status:  Goal on track:  Yes.) Long Term Goal Last practice recorded BP readings:  BP Readings from Last 3 Encounters:  06/22/23 125/75  05/09/23 114/80  04/04/23 134/77   Most recent eGFR/CrCl:  Lab Results  Component Value Date   EGFR 71 05/18/2023    No components found for: "CRCL"  Evaluation of current treatment plan related to hypertension self management and patient's adherence to plan as established by provider. Denies any chest pain, dizziness or headaches at this time. Provided education to patient re: stroke prevention, s/s of heart attack and stroke. Education and support provided Reviewed medications with patient and discussed importance of compliance. Reports compliance with all medications Counseled on adverse effects of illicit drug and excessive alcohol use in patients with high blood pressure  Discussed plans with patient for ongoing care management follow up and provided patient with direct contact information for care management team Discussed complications of poorly controlled blood pressure such as heart disease, stroke, circulatory complications, vision complications, kidney impairment,  sexual dysfunction   Patient Goals/Self-Care Activities: Take all medications as prescribed Attend all scheduled provider appointments Call pharmacy for medication refills 3-7 days in advance of running out of medications Attend church or other social activities Call provider office for new concerns or questions  call the Suicide and Crisis Lifeline: 988 call the [place] National Suicide Prevention Lifeline: 416-293-1442 or TTY: 318 219 7746 TTY 928-195-9380) to talk to a trained counselor call 1-800-273-TALK (toll free, 24 hour hotline) go to [facility] [address], [place] [phone]) call 911 if experiencing a Mental Health or Behavioral Health Crisis  identify and remove indoor air pollutants do breathing exercises every day begin a symptom diary eliminate symptom triggers at home write blood pressure results in a log or diary call doctor for signs and symptoms of high blood pressure  Follow Up Plan:  Telephone follow up appointment with care management team member scheduled for:  07-26-2023 at 10:30 am           Our next appointment is by telephone on 07-26-2023 at 10:30 am  Please call the care guide team at [phone] if you need to cancel or reschedule your appointment.   If you are experiencing a Mental Health or Behavioral Health Crisis or need someone to talk to, please call the Suicide and Crisis Lifeline: 988 call the [place] National Suicide Prevention Lifeline: 6395435516 or TTY: 301-680-9703 TTY 902-725-4894) to talk to a trained counselor call 1-800-273-TALK (toll free, 24 hour hotline) go to [facility] [address] [place] [phone])   Patient verbalizes understanding of instructions and care plan provided today and agrees to view in MyChart. Active MyChart  status and patient understanding of how to access instructions and care plan via MyChart confirmed with patient.      Telephone follow up appointment with care management team member scheduled for:07-26-2023 at 10:30 am  Larey Brick, BSN RN [facility], [facility] RN Care Manager Direct Dial: [phone]  Fax: [phone]

## 2023-06-28 NOTE — ED Provider Notes (Signed)
 [place] EMERGENCY DEPARTMENT AT [facility] Provider Note   CSN: [id number] Arrival date & time: 06/28/23  1756     History  Chief Complaint  Patient presents with   Shortness of Breath    [name] is a 68 y.o. female.  Pt is a 68 year old female with history of lumbar radiculopathy, CHF with EF of 60-65%, pancreatic insufficiency, and status post gastric bypass, persistent asthma, prior DVT no longer on anticoagulation, htn and recurrent episodes of cdiff who presents today with several complaints.  Patient reports that on Thursday she started having cough, general malaise and diarrhea.  She reports that she has been using her inhaler with some improvement but today it became worse and her inhaler did not help.  She has become very winded even with trying to do minimal things at home and the diarrhea is getting worse with lower abdominal pain.  She denies any vomiting and reports about 2-3 episodes of diarrhea per day.  She did see her doctor last week and reports she went on antibiotic (Cipro) for a UTI but stopped it early because the diarrhea became worse.  She reports she is up between 5 and 8 pounds from her baseline because she checks her weight daily and her torsemide did not seem to be making her urinate as much as she normally would.  She does report that she is not eating or drinking as much either.  She has not had a fever that she is aware of and has not had a productive cough.  She spoke with her doctor who recommended she come here.  The history is provided by the patient and medical records.  Shortness of Breath      Home Medications Prior to Admission medications   Medication Sig Start Date End Date Taking? Authorizing Provider  albuterol (PROVENTIL) (2.5 MG/3ML) 0.083% nebulizer solution Take 3 mLs (2.5 mg total) by nebulization every 6 (six) hours as needed for wheezing or shortness of breath. 03/21/23   Hilbert Bible, FNP  albuterol  (VENTOLIN HFA) 108 (90 Base) MCG/ACT inhaler INHALE 2 PUFFS INTO THE LUNGS EVERY 6 HOURS AS NEEDED FOR WHEEZING OR SHORTNESS OF BREATH 02/07/20   Myrlene Broker, MD  amLODipine (NORVASC) 10 MG tablet Take 1 tablet (10 mg total) by mouth daily. 02/01/23   Marinda Elk, MD  Ascorbic Acid (VITAMIN C) 1000 MG tablet Take 1,000 mg by mouth daily. 11/07/10   [provider]  aspirin EC 81 MG tablet Take 1 tablet (81 mg total) by mouth daily. Swallow whole. 01/26/23   Pokhrel, Rebekah Chesterfield, MD  buPROPion (WELLBUTRIN XL) 300 MG 24 hr tablet TAKE 1 TABLET(300 MG) BY MOUTH DAILY 06/20/23   Caudle, Shelton Silvas, FNP  cephALEXin (KEFLEX) 250 MG capsule Take 1 capsule (250 mg total) by mouth 4 (four) times daily for 7 days. 06/22/23 06/29/23  Hilbert Bible, FNP  ferrous sulfate 325 (65 FE) MG tablet Take 1 tablet (325 mg total) by mouth daily with breakfast. 01/18/23   Marinda Elk, MD  fluticasone [facility]) 50 MCG/ACT nasal spray Place 2 sprays into both nostrils daily. 03/21/23   Caudle, Shelton Silvas, FNP  Fluticasone-Umeclidin-Vilant (TRELEGY ELLIPTA) 100-62.5-25 MCG/ACT AEPB Inhale 1 puff into the lungs daily. 05/17/23   Caudle, Shelton Silvas, FNP  gabapentin (NEURONTIN) 100 MG capsule Take 2 capsules (200 mg total) by mouth 2 (two) times daily. 06/24/22   Meuth, Brooke A, PA-C  ipratropium (ATROVENT) 0.02 % nebulizer solution Take 2.5  mLs (0.5 mg total) by nebulization every 6 (six) hours as needed for wheezing or shortness of breath. 03/21/23   Caudle, Shelton Silvas, FNP  lipase/protease/amylase (CREON) 36000 UNITS CPEP capsule Take 36,000 Units by mouth 4 (four) times daily as needed (Stomach Pain). 04/28/20   [provider]  loperamide (IMODIUM) 2 MG capsule Take 1 capsule (2 mg total) by mouth as needed for diarrhea or loose stools. 06/24/22   Meuth, Brooke A, PA-C  montelukast (SINGULAIR) 10 MG tablet Take 1 tablet (10 mg total) by mouth at bedtime. 12/27/22   Myrlene Broker, MD  Multiple Vitamin (MULTI-VITAMIN) tablet Take 1 tablet by mouth daily.    [provider]  ondansetron (ZOFRAN) 4 MG tablet Take 1 tablet (4 mg total) by mouth every 8 (eight) hours as needed for nausea or vomiting. 02/24/23   Rai, Delene Ruffini, MD  pantoprazole (PROTONIX) 40 MG tablet Take 1 tablet (40 mg total) by mouth 2 (two) times daily before a meal. 05/09/23   Charlott Holler, MD  PARoxetine (PAXIL) 20 MG tablet Take 1 tablet (20 mg total) by mouth daily. 12/27/22   Myrlene Broker, MD  psyllium (HYDROCIL/METAMUCIL) 95 % PACK Take 1 packet by mouth 2 (two) times daily. Patient taking differently: Take 1 packet by mouth 2 (two) times daily as needed for mild constipation. 06/24/22   Meuth, Brooke A, PA-C  torsemide (DEMADEX) 20 MG tablet Take by mouth.    [provider]  traMADol (ULTRAM) 50 MG tablet Take 1 tablet (50 mg total) by mouth every 6 (six) hours as needed for severe pain (pain score 7-10). 06/10/23   Caudle, Shelton Silvas, FNP  traZODone (DESYREL) 50 MG tablet Take 1 tablet (50 mg total) by mouth at bedtime as needed for sleep. 08/17/22   Myrlene Broker, MD  VITAMIN D PO Take 1 capsule by mouth daily.    [provider]      Allergies    Macrobid [nitrofurantoin], Carafate [sucralfate], Sulfonamide derivatives, and Infed [iron dextran]    Review of Systems   Review of Systems  Respiratory:  Positive for shortness of breath.     Physical Exam Updated Vital Signs BP (!) 115/58   Pulse 69   Temp 98.3 F (36.8 C) (Oral)   Resp 20   SpO2 95%  Physical Exam Vitals and nursing note reviewed.  Constitutional:      General: She is not in acute distress.    Appearance: She is well-developed.  HENT:     Head: Normocephalic and atraumatic.  Eyes:     Pupils: Pupils are equal, round, and reactive to light.  Cardiovascular:     Rate and Rhythm: Normal rate and regular rhythm.     Heart sounds: Normal heart sounds. No murmur  heard.    No friction rub.  Pulmonary:     Effort: Pulmonary effort is normal. Tachypnea present.     Breath sounds: Examination of the right-middle field reveals decreased breath sounds. Examination of the left-middle field reveals decreased breath sounds. Examination of the right-lower field reveals decreased breath sounds. Examination of the left-lower field reveals decreased breath sounds. Decreased breath sounds and wheezing present. No rales.  Abdominal:     General: Bowel sounds are normal. There is distension.     Palpations: Abdomen is soft.     Tenderness: There is abdominal tenderness in the right lower quadrant, suprapubic area and left lower quadrant. There is no guarding or rebound.  Musculoskeletal:        General: No tenderness. Normal range of motion.     Right lower leg: Edema present.     Left lower leg: Edema present.     Comments: Trace edema in bilateral ankles  Skin:    General: Skin is warm and dry.     Findings: No rash.  Neurological:     Mental Status: She is alert and oriented to person, place, and time. Mental status is at baseline.     Cranial Nerves: No cranial nerve deficit.  Psychiatric:        Mood and Affect: Mood normal.        Behavior: Behavior normal.     ED Results / Procedures / Treatments   Labs (all labs ordered are listed, but only abnormal results are displayed) Labs Reviewed  CBC - Abnormal; Notable for the following components:      Result Value   Hemoglobin 11.6 (*)    RDW 18.5 (*)    All other components within normal limits  COMPREHENSIVE METABOLIC PANEL - Abnormal; Notable for the following components:   Glucose, Bld 132 (*)    Calcium 8.5 (*)    ALT 46 (*)    Alkaline Phosphatase 136 (*)    All other components within normal limits  BRAIN NATRIURETIC PEPTIDE - Abnormal; Notable for the following components:   B Natriuretic Peptide 108.2 (*)    All other components within normal limits  RESP PANEL BY RT-PCR (RSV, FLU A&B,  COVID)  RVPGX2  C DIFFICILE QUICK SCREEN W PCR REFLEX    TROPONIN I (HIGH SENSITIVITY)  TROPONIN I (HIGH SENSITIVITY)    EKG None  Radiology DG Chest 2 View Result Date: 06/28/2023 CLINICAL DATA:  Shortness of breath EXAM: CHEST - 2 VIEW COMPARISON:  Chest x-ray 06/22/2023 FINDINGS: Heart is mildly enlarged. There central pulmonary vascular congestion. There is linear atelectasis in the left mid lung. There is no pleural effusion or pneumothorax. No acute fractures are seen. There is dextroconvex scoliosis of the spine. IMPRESSION: 1. Mild cardiomegaly with central pulmonary vascular congestion. 2. Linear atelectasis in the left mid lung. Electronically Signed   By: Darliss Cheney M.D.   On: 06/28/2023 20:28    Procedures Procedures    Medications Ordered in ED Medications  albuterol (PROVENTIL) (2.5 MG/3ML) 0.083% nebulizer solution 5 mg (has no administration in time range)  ipratropium (ATROVENT) nebulizer solution 0.5 mg (has no administration in time range)  ondansetron (ZOFRAN) injection 4 mg (4 mg Intravenous Given 06/28/23 2210)  morphine (PF) 4 MG/ML injection 4 mg (4 mg Intravenous Given 06/28/23 2211)  iohexol (OMNIPAQUE) 350 MG/ML injection 100 mL (100 mLs Intravenous Contrast Given 06/28/23 2154)    ED Course/ Medical Decision Making/ A&P                                 Medical Decision Making Amount and/or Complexity of Data Reviewed Labs: ordered. Radiology: ordered.  Risk Prescription drug management.   Pt with multiple medical problems and comorbidities and presenting today with a complaint that caries a high risk for morbidity and mortality.  Here today with the above complaints.  Concern for asthma exacerbation, bronchitis/pneumonia, CHF exacerbation, PE.  Also patient is having lower abdominal pain with diarrhea in the setting of having recent antibiotics and a prior history of C. difficile and concern for colitis, pyelonephritis, viral etiology.  Patient is  wheezing with only minimal activity here but sats are greater than 90%.  Patient given albuterol, Atrovent.  I ended I independently interpreted patient's labs and EKG.  EKG is a sinus rhythm without acute changes.  CBC, troponin, viral panel are within normal limits, CMP with no acute findings, BNP minimally elevated at 108. I have independently visualized and interpreted pt's images today.  Chest x-ray with some mild pulmonary edema.           Final Clinical Impression(s) / ED Diagnoses Final diagnoses:  None    Rx / DC Orders ED Discharge Orders     None         Gwyneth Sprout, MD 06/28/23 2326

## 2023-06-28 NOTE — ED Triage Notes (Signed)
 Increased SOB a couple days Takes torsemide, but feels like she hasn't been urinating as much Increased abdo swelling/girth  Diminished lung sounds

## 2023-06-28 NOTE — ED Notes (Signed)
 PT placed on Enteric Precautions

## 2023-06-28 NOTE — Progress Notes (Signed)
 This encounter was created in error - please disregard.

## 2023-06-28 NOTE — ED Provider Notes (Signed)
  Physical Exam  BP (!) 115/58   Pulse 69   Temp 98.3 F (36.8 C) (Oral)   Resp 20   SpO2 94%   Physical Exam  Procedures  Procedures  ED Course / MDM    Medical Decision Making Amount and/or Complexity of Data Reviewed Labs: ordered. Radiology: ordered.  Risk Prescription drug management.   3F hx of asthma, c diff, gastric bypass, CHF presenting with URI sx, poss CHF exacerbation, worsening SOB. 5-8lb weight gain, not responding to diuretics, no CP. Abd tightness. Recently started Cipro for UTI, worsening diarrhea and lower abd pain. Got nebs for wheezing. Waiting on scans and reassessment.   CTA PE: IMPRESSION:  1. No evidence for pulmonary embolism.  2. Cardiomegaly.  3. Scattered linear areas of atelectasis or scarring in both lungs.    CT Abd Pel: IMPRESSION:  1. No acute intra-abdominal or intrapelvic abnormality.  2. Colonic diverticulosis.  3. IVC filter in appropriate position.  4. Status post cholecystectomy, gastric sleeve formation, hernia  repair with mesh.   Patient feeling improved, comfortable with plan for discharge.  CT imaging reassuring as was her laboratory evaluation.  Unable to provide a stool sample in the ER.  I recommended that she follow-up with her PCP to provide a stool sample if concern persists for C. difficile infection.  Will treat with a course of azithromycin, prednisone, Tessalon for her cough which is likely due to mild bronchitis.       Ernie Avena, MD 06/29/23 [id number]

## 2023-06-29 ENCOUNTER — Encounter (HOSPITAL_BASED_OUTPATIENT_CLINIC_OR_DEPARTMENT_OTHER): Payer: Self-pay | Admitting: Family Medicine

## 2023-06-29 MED ORDER — PREDNISONE 10 MG PO TABS: 40.0000 mg | ORAL_TABLET | Freq: Every day | ORAL | 0 refills | Status: AC

## 2023-06-29 MED ORDER — BENZONATATE 100 MG PO CAPS: 100.0000 mg | ORAL_CAPSULE | Freq: Three times a day (TID) | ORAL | 0 refills | Status: DC

## 2023-06-29 MED ORDER — AZITHROMYCIN 250 MG PO TABS: 250.0000 mg | ORAL_TABLET | Freq: Every day | ORAL | 0 refills | Status: DC

## 2023-06-29 MED ORDER — PREDNISONE 10 MG PO TABS: 40.0000 mg | ORAL_TABLET | Freq: Every day | ORAL | 0 refills | Status: DC

## 2023-06-29 NOTE — Telephone Encounter (Signed)
 [name], Please see mychart message sent by pt and advise.

## 2023-06-29 NOTE — Discharge Instructions (Addendum)
 Follow-up with your primary care provider for a check of your stool sample to determine if you have evidence of C. difficile as you were not able to provide a stool sample here in the emergency department.  We will treat your acute bronchitis with azithromycin, Tessalon and prednisone.  Return for any severe worsening symptoms.

## 2023-06-30 ENCOUNTER — Ambulatory Visit: Payer: Self-pay | Admitting: Licensed Clinical Social Worker

## 2023-06-30 ENCOUNTER — Telehealth: Payer: Self-pay

## 2023-06-30 NOTE — Patient Instructions (Signed)
 Visit Information  Thank you for taking time to visit with me today. Please don't hesitate to contact me if I can be of assistance to you.   Following are the goals we discussed today:   Goals Addressed             This Visit's Progress    COMPLETED: Care Coordination Activities- BSW Plan of Care       Care Coordination Interventions: Patient stated that things are going well, she has food but would like to know if any of the food banks deliver if she ever runs low, the SW explained that most of them don't and those that do it is done during the holidays.  Patient stated that her sister is providing transportation for her medical and other places that she needs to go. Patient stated that her electric bill was over $400 for one month but she did call the electric company to discuss and to have it explained and it was stated due to the colder night and the system working harder and she stated that the [employer] also mailed her a Pharmacist, hospital, but she said that it is the same list the SW provided her. SW will follow up 06/30/2023 at 10:00 am         No further follow up, SW encouraged the patient to contact the PCP for any SDOH SW needs.   Please call the care guide team at [phone] if you need to cancel or reschedule your appointment.   If you are experiencing a Mental Health or Behavioral Health Crisis or need someone to talk to, please call the Suicide and Crisis Lifeline: 988 go to [facility] [address] [place] [phone]) call 911  Patient verbalizes understanding of instructions and care plan provided today and agrees to view in MyChart. Active MyChart status and patient understanding of how to access instructions and care plan via MyChart confirmed with patient.     [name], PhD [facility], [facility] Social Worker Direct Dial: [phone]  Fax: [phone]

## 2023-06-30 NOTE — Patient Outreach (Addendum)
 Care Coordination   Follow Up Visit Note   06/30/2023 Name: [name] MRN: [id number] DOB: 29-Dec-1955  [name] is a 68 y.o. year old female who sees Caudle, Shelton Silvas, FNP for primary care. I spoke with  [name] by phone today.  What matters to the patients health and wellness today?  SDOH Follow up     Goals Addressed             This Visit's Progress    COMPLETED: Care Coordination Activities- BSW Plan of Care       Care Coordination Interventions: Patient stated that things are going well, she has food but would like to know if any of the food banks deliver if she ever runs low, the SW explained that most of them don't and those that do it is done during the holidays.  Patient stated that her sister is providing transportation for her medical and other places that she needs to go. Patient stated that her electric bill was over $400 for one month but she did call the electric company to discuss and to have it explained and it was stated due to the colder night and the system working harder and she stated that the [employer] also mailed her a Pharmacist, hospital, but she said that it is the same list the SW provided her. SW will follow up 06/30/2023 at 10:00 am         SDOH assessments and interventions completed:  Yes  SDOH Interventions Today    Flowsheet Row Most Recent Value  SDOH Interventions   Food Insecurity Interventions Intervention Not Indicated  Housing Interventions Intervention Not Indicated  Transportation Interventions Intervention Not Indicated  Utilities Interventions Intervention Not Indicated        Care Coordination Interventions:  Yes, provided  Interventions Today    Flowsheet Row Most Recent Value  General Interventions   General Interventions Discussed/Reviewed General Interventions Reviewed, Science writer received the [facility] application and will wait to see what her landlord will do about the  property.]       Interventions Today    Flowsheet Row Most Recent Value  General Interventions   General Interventions Discussed/Reviewed General Interventions Reviewed, [employer] is doing well and no other SDOH needs at this time]        Follow up plan: No further intervention required.   Encounter Outcome:  Patient Visit Completed   [name], PhD [facility], [facility] Social Worker Direct Dial: [phone]  Fax: [phone]

## 2023-06-30 NOTE — Transitions of Care (Post Inpatient/ED Visit) (Signed)
 06/30/2023  Name: [name] MRN: [id number] DOB: 1955/12/05  Today's TOC FU Call Status: Today's TOC FU Call Status:: Successful TOC FU Call Completed TOC FU Call Complete Date: 06/30/23 Patient's Name and Date of Birth confirmed.  Transition Care Management Follow-up Telephone Call Date of Discharge: 06/29/23 Discharge Facility: [facility] ([facility]) Type of Discharge: Emergency Department Reason for ED Visit: Other: (diarrhea) How have you been since you were released from the hospital?: Same Any questions or concerns?: No  Items Reviewed: Did you receive and understand the discharge instructions provided?: Yes Medications obtained,verified, and reconciled?: Yes (Medications Reviewed) Any new allergies since your discharge?: No Dietary orders reviewed?: Yes Do you have support at home?: No  Medications Reviewed Today: Medications Reviewed Today     Reviewed by Karena Addison, LPN (Licensed Practical Nurse) on 06/30/23 at 1237  Med List Status: <None>   Medication Order Taking? Sig Documenting Provider Last Dose Status Informant  albuterol (PROVENTIL) (2.5 MG/3ML) 0.083% nebulizer solution [id number] No Take 3 mLs (2.5 mg total) by nebulization every 6 (six) hours as needed for wheezing or shortness of breath. Hilbert Bible, FNP Taking Active   albuterol (VENTOLIN HFA) 108 (90 Base) MCG/ACT inhaler [id number] No INHALE 2 PUFFS INTO THE LUNGS EVERY 6 HOURS AS NEEDED FOR WHEEZING OR SHORTNESS OF Elray Buba, MD Taking Active Self, Pharmacy Records  amLODipine (NORVASC) 10 MG tablet [id number] No Take 1 tablet (10 mg total) by mouth daily. Marinda Elk, MD Taking Active   Ascorbic Acid (VITAMIN C) 1000 MG tablet [id number] No Take 1,000 mg by mouth daily. [provider] Taking Active Self, Pharmacy Records  aspirin EC 81 MG tablet [id number] No Take 1 tablet (81 mg total) by mouth daily. Swallow whole. Pokhrel, Laxman, MD Taking  Active   azithromycin (ZITHROMAX) 250 MG tablet [id number]  Take 1 tablet (250 mg total) by mouth daily. Take first 2 tablets together, then 1 every day until finished. Ernie Avena, MD  Active   benzonatate (TESSALON) 100 MG capsule [id number]  Take 1 capsule (100 mg total) by mouth every 8 (eight) hours. Ernie Avena, MD  Active   buPROPion (WELLBUTRIN XL) 300 MG 24 hr tablet [id number] No TAKE 1 TABLET(300 MG) BY MOUTH DAILY Caudle, Shelton Silvas, FNP Taking Active   ferrous sulfate 325 (65 FE) MG tablet [id number] No Take 1 tablet (325 mg total) by mouth daily with breakfast. Marinda Elk, MD Taking Active   fluticasone [facility]) 50 MCG/ACT nasal spray [id number] No Place 2 sprays into both nostrils daily. Hilbert Bible, FNP Taking Active   Fluticasone-Umeclidin-Vilant (TRELEGY ELLIPTA) 100-62.5-25 MCG/ACT AEPB [id number] No Inhale 1 puff into the lungs daily. Hilbert Bible, FNP Taking Active   gabapentin (NEURONTIN) 100 MG capsule [id number] No Take 2 capsules (200 mg total) by mouth 2 (two) times daily. Franne Forts, PA-C Taking Active Self, Pharmacy Records  ipratropium (ATROVENT) 0.02 % nebulizer solution [id number] No Take 2.5 mLs (0.5 mg total) by nebulization every 6 (six) hours as needed for wheezing or shortness of breath. Hilbert Bible, FNP Taking Active   lipase/protease/amylase (CREON) 36000 UNITS CPEP capsule [id number] No Take 36,000 Units by mouth 4 (four) times daily as needed (Stomach Pain). [provider] Taking Active Self, Pharmacy Records  loperamide (IMODIUM) 2 MG capsule [id number] No Take 1 capsule (2 mg total) by mouth as needed for diarrhea or loose stools. Meuth, Lina Sar, PA-C Taking Active Self, Pharmacy Records  montelukast (SINGULAIR) 10 MG  tablet [id number] No Take 1 tablet (10 mg total) by mouth at bedtime. Myrlene Broker, MD Taking Active Self, Pharmacy Records  Multiple Vitamin (MULTI-VITAMIN) tablet [id number] No Take 1  tablet by mouth daily. [provider] Taking Active Self, Pharmacy Records  ondansetron (ZOFRAN) 4 MG tablet [id number] No Take 1 tablet (4 mg total) by mouth every 8 (eight) hours as needed for nausea or vomiting. Rai, Delene Ruffini, MD Taking Active   pantoprazole (PROTONIX) 40 MG tablet [id number] No Take 1 tablet (40 mg total) by mouth 2 (two) times daily before a meal. Charlott Holler, MD Taking Active   PARoxetine (PAXIL) 20 MG tablet [id number] No Take 1 tablet (20 mg total) by mouth daily. Myrlene Broker, MD Taking Active Self, Pharmacy Records  predniSONE (DELTASONE) 10 MG tablet [id number]  Take 4 tablets (40 mg total) by mouth daily for 5 days. Ernie Avena, MD  Active   psyllium (HYDROCIL/METAMUCIL) 95 % PACK [id number] No Take 1 packet by mouth 2 (two) times daily.  Patient taking differently: Take 1 packet by mouth 2 (two) times daily as needed for mild constipation.   Franne Forts, PA-C Taking Active Self, Pharmacy Records  torsemide (DEMADEX) 20 MG tablet [id number] No Take by mouth. [provider] Taking Active   traMADol (ULTRAM) 50 MG tablet [id number] No Take 1 tablet (50 mg total) by mouth every 6 (six) hours as needed for severe pain (pain score 7-10). Hilbert Bible, FNP Taking Active   traZODone (DESYREL) 50 MG tablet [id number] No Take 1 tablet (50 mg total) by mouth at bedtime as needed for sleep. Myrlene Broker, MD Taking Active Self, Pharmacy Records  VITAMIN D PO [id number] No Take 1 capsule by mouth daily. [provider] Taking Active Self, Pharmacy Records            Home Care and Equipment/Supplies: Were Home Health Services Ordered?: NA Any new equipment or medical supplies ordered?: NA  Functional Questionnaire: Do you need assistance with bathing/showering or dressing?: No Do you need assistance with meal preparation?: No Do you need assistance with eating?: No Do you have difficulty maintaining continence:  No Do you need assistance with getting out of bed/getting out of a chair/moving?: No Do you have difficulty managing or taking your medications?: No  Follow up appointments reviewed: PCP Follow-up appointment confirmed?: No (declined appt) MD Provider Line Number:[phone] Given: No Specialist Hospital Follow-up appointment confirmed?: NA Do you need transportation to your follow-up appointment?: No Do you understand care options if your condition(s) worsen?: Yes-patient verbalized understanding    SIGNATURE Karena Addison, LPN [facility] Nurse Health Advisor Direct Dial [phone]

## 2023-07-12 ENCOUNTER — Ambulatory Visit: Payer: Medicare HMO | Attending: Nurse Practitioner | Admitting: Nurse Practitioner

## 2023-07-12 ENCOUNTER — Encounter: Payer: Self-pay | Admitting: Nurse Practitioner

## 2023-07-12 VITALS — BP 124/68 | HR 70 | Ht 65.0 in | Wt 259.0 lb

## 2023-07-12 DIAGNOSIS — I340 Nonrheumatic mitral (valve) insufficiency: Secondary | ICD-10-CM

## 2023-07-12 DIAGNOSIS — Z86718 Personal history of other venous thrombosis and embolism: Secondary | ICD-10-CM | POA: Diagnosis not present

## 2023-07-12 DIAGNOSIS — G4733 Obstructive sleep apnea (adult) (pediatric): Secondary | ICD-10-CM

## 2023-07-12 DIAGNOSIS — I2510 Atherosclerotic heart disease of native coronary artery without angina pectoris: Secondary | ICD-10-CM

## 2023-07-12 DIAGNOSIS — I77810 Thoracic aortic ectasia: Secondary | ICD-10-CM | POA: Diagnosis not present

## 2023-07-12 DIAGNOSIS — R002 Palpitations: Secondary | ICD-10-CM

## 2023-07-12 DIAGNOSIS — I5032 Chronic diastolic (congestive) heart failure: Secondary | ICD-10-CM

## 2023-07-12 DIAGNOSIS — I10 Essential (primary) hypertension: Secondary | ICD-10-CM

## 2023-07-12 MED ORDER — POTASSIUM CHLORIDE CRYS ER 20 MEQ PO TBCR: 20.0000 meq | EXTENDED_RELEASE_TABLET | ORAL | 3 refills | Status: DC | PRN

## 2023-07-12 MED ORDER — TORSEMIDE 10 MG PO TABS: 10.0000 mg | ORAL_TABLET | Freq: Every day | ORAL | 3 refills | Status: DC

## 2023-07-12 NOTE — Progress Notes (Unsigned)
 Office Visit    Patient Name: [name] Date of Encounter: 07/12/2023  Primary Care Provider:  Hilbert Bible, FNP Primary Cardiologist:  Bryan Lemma, MD  Chief Complaint    68 year old female with a history of minimal minimal nonobstructive CAD, PSVT, chronic diastolic heart failure, mild mitral valve regurgitation, mild dilation of the ascending aorta, hypertension, bilateral DVTs s/p IVC filter in 2009, OSA, and obesity.  Past Medical History    Past Medical History:  Diagnosis Date   Allergy 2010   Anemia    takes Ferrous Sulfate daily   Anxiety    takes Citalopram daily   Arthritis    Asthma    2004-prior to gastric bypass and no problems since   CAP (community acquired pneumonia) 07/23/2021   CHF (congestive heart failure) (HCC)    takes Lasix daily as needed   Chronic back pain    spondylolisthesis/stenosis/radiculopathy   CKD (chronic kidney disease) stage 2, GFR 60-89 ml/min    Complication of anesthesia yrs ago   slow to wake up   Depression    Diabetes (HCC)    DVT (deep venous thrombosis) (HCC) 01/17/2013   past hx. -tx.5-6 yrs ago bilateral legs, occ. sporadic swelling, has IVC filter implanted   Dysrhythmia    "heart tends to flutter"   Fibromyalgia    Fracture    right foot and is in a cam boot   Gallstones    GERD (gastroesophageal reflux disease)    hx of-no meds now   HCAP (healthcare-associated pneumonia) 02/19/2023   Heart murmur    History of bronchitis 2012 or 2013   History of colon polyps    Hypertension    takes Metoprolol daily   Insomnia    takes Melatonin daily   Microalbuminuria    Pelvic floor dysfunction    Peripheral neuropathy    Pneumonia 90's   hx of   Recurrent Clostridioides difficile infection 02/28/2020   S/P gastric bypass 2003   Sleep apnea    no cpap used in many yrs after weight lost-no machine now   Urinary frequency    Urinary urgency    Vitamin D deficiency    Past Surgical History:   Procedure Laterality Date   BALLOON DILATION N/A 01/31/2013   Procedure: BALLOON DILATION;  Surgeon: Willis Modena, MD;  Location: [facility];  Service: Endoscopy;  Laterality: N/A;   CARDIAC EVENT MONITOR  03/2019   Predominantly sinus rhythm.  Rates range from 48-124 bpm.  Average 74 bpm.  Frequent short bursts (3-15 beats) PAT/PSVT--not indicated as being symptomatic on diary.  Otherwise rare PACs and PVCs.   CARDIAC EVENT MONITOR  08/2021   14-Day Zio Patch: Predominantly sinus rhythm with rate range 45 to 135 bpm.  Average 73 bpm.  Occasional (1.3%) PACs with rare PVCs and rare PAC couplets.  24 episodes of atrial runs: Fastest was 11 beats at a rate of 184 bpm, longest was 13 beats with a rate of 92 bpm.  No sustained tachyarrhythmias or bradycardia arrhythmias.  No atrial fibrillation or flutter.   CARDIOPULMONARY EXERCISE STRESS TEST (CPX)  10/27/2021   Good effort despite dyspnea and wheezing.  Modified Naughton protocol on treadmill.  Normal heart rate response.  No sustained arrhythmias.  Preexercise spirometry normal.  Low normal peak VO2. Low normal functional capacity with NO Clear Cardiopulmonary Limitation.  Overall limitation primarily due to obesity & deconditioning, along with a component of Diastolic Dysfunction   CHOLECYSTECTOMY  2007  COLONOSCOPY     CT CTA CORONARY W/CA SCORE W/CM &/OR WO/CM  09/21/2019   Coronary Calcium Score 0.  Normal RCA dominant coronary anatomy.  CAD RADS 1-minimal nonobstructive CAD (0-24%).  Consider nonatherosclerotic cause for chest pain.  Consider preventative therapy and risk factor modification.   DILATION AND CURETTAGE OF UTERUS  yrs ago   ESOPHAGOGASTRODUODENOSCOPY (EGD) WITH PROPOFOL  03/01/2012   Procedure: ESOPHAGOGASTRODUODENOSCOPY (EGD) WITH PROPOFOL;  Surgeon: Willis Modena, MD;  Location: [facility];  Service: Endoscopy;  Laterality: N/A;   ESOPHAGOGASTRODUODENOSCOPY (EGD) WITH PROPOFOL N/A 01/31/2013   Procedure:  ESOPHAGOGASTRODUODENOSCOPY (EGD) WITH PROPOFOL;  Surgeon: Willis Modena, MD;  Location: [facility];  Service: Endoscopy;  Laterality: N/A;   ESOPHAGOGASTRODUODENOSCOPY (EGD) WITH PROPOFOL N/A 09/02/2015   Procedure: ESOPHAGOGASTRODUODENOSCOPY (EGD) WITH PROPOFOL;  Surgeon: Iva Boop, MD;  Location: [facility];  Service: Endoscopy;  Laterality: N/A;   FRACTURE SURGERY     GASTRIC BY-PASS  2004   HERNIA REPAIR  2005   INSERTION OF VENA CAVA FILTER  01/17/2013   inserted 2004- "abdomen"   LIGAMENT REPAIR Right 1987   Rt. knee scope   MAXIMUM ACCESS (MAS)POSTERIOR LUMBAR INTERBODY FUSION (PLIF) 2 LEVEL N/A 06/07/2014   Procedure: L4-5 L5-S1 FOR MAXIMUM ACCESS (MAS) POSTERIOR LUMBAR INTERBODY FUSION ;  Surgeon: Maeola Harman, MD;  Location: [facility];  Service: Neurosurgery;  Laterality: N/A;  L4-5 L5-S1 FOR MAXIMUM ACCESS (MAS) POSTERIOR LUMBAR INTERBODY FUSION    SPINE SURGERY  2017   TONSILLECTOMY     as child   TRANSTHORACIC ECHOCARDIOGRAM  12/2020   EF 55 to 60%.  No RWMA.  Mild LVH.  GR 1 DD-moderate LA dilation..  Mild MR..  Normal RV size/function.  Normal PAP.  Normal RAP. => Essentially stable from December 2020    Allergies  Allergies  Allergen Reactions   Macrobid [Nitrofurantoin] Other (See Comments)    C.Diff Infection   Carafate [Sucralfate] Hives, Itching and Other (See Comments)    Angioedema      Sulfonamide Derivatives Rash    Syncope   Infed [Iron Dextran]      Labs/Other Studies Reviewed    The following studies were reviewed today:  Cardiac Studies & Procedures   ______________________________________________________________________________________________     ECHOCARDIOGRAM  ECHOCARDIOGRAM COMPLETE 01/21/2023  Narrative ECHOCARDIOGRAM REPORT    Patient Name:   [name] Date of Exam: 01/21/2023 Medical Rec #:  [id number]           Height:       65.0 in Accession #:    [id number]          Weight:       247.0 lb Date of Birth:   1956/03/16           BSA:          2.164 m Patient Age:    67 years            BP:           144/110 mmHg Patient Gender: F                   HR:           70 bpm. Exam Location:  Inpatient  Procedure: 2D Echo, Cardiac Doppler and Color Doppler  Indications:    TIA G45.9  History:        Patient has prior history of Echocardiogram examinations, most recent 01/14/2021. COVID; Risk Factors:Hypertension, Diabetes and Former Smoker.  Sonographer:  Dondra Prader RVT RCS Referring Phys: [phone] [facility]   Sonographer Comments: Technically challenging study due to limited acoustic windows, Technically difficult study due to poor echo windows, suboptimal parasternal window and suboptimal apical window. Image acquisition challenging due to patient body habitus. Unable to complete ECHO; patient unable to tolerate probe pressure. IMPRESSIONS   1. Left ventricular ejection fraction, by estimation, is 60 to 65%. The left ventricle has normal function. The left ventricle has no regional wall motion abnormalities. Left ventricular diastolic parameters are consistent with Grade I diastolic dysfunction (impaired relaxation). 2. Right ventricular systolic function is normal. The right ventricular size is normal. 3. The mitral valve is normal in structure. Trivial mitral valve regurgitation. No evidence of mitral stenosis. 4. Tricuspid valve regurgitation is mild to moderate. 5. The aortic valve has an indeterminant number of cusps. Aortic valve regurgitation is not visualized. No aortic stenosis is present. 6. There is borderline dilatation of the aortic root, measuring 37 mm. 7. The inferior vena cava is normal in size with greater than 50% respiratory variability, suggesting right atrial pressure of 3 mmHg.  FINDINGS Left Ventricle: Left ventricular ejection fraction, by estimation, is 60 to 65%. The left ventricle has normal function. The left ventricle has no regional wall motion  abnormalities. The left ventricular internal cavity size was normal in size. There is no left ventricular hypertrophy. Left ventricular diastolic parameters are consistent with Grade I diastolic dysfunction (impaired relaxation).  Right Ventricle: The right ventricular size is normal. No increase in right ventricular wall thickness. Right ventricular systolic function is normal.  Left Atrium: Left atrial size was normal in size.  Right Atrium: Right atrial size was normal in size.  Pericardium: There is no evidence of pericardial effusion. Presence of epicardial fat layer.  Mitral Valve: The mitral valve is normal in structure. Trivial mitral valve regurgitation. No evidence of mitral valve stenosis.  Tricuspid Valve: The tricuspid valve is normal in structure. Tricuspid valve regurgitation is mild to moderate. No evidence of tricuspid stenosis.  Aortic Valve: The aortic valve has an indeterminant number of cusps. Aortic valve regurgitation is not visualized. No aortic stenosis is present. Aortic valve mean gradient measures 3.0 mmHg. Aortic valve peak gradient measures 6.2 mmHg. Aortic valve area, by VTI measures 2.64 cm.  Pulmonic Valve: The pulmonic valve was not well visualized. Pulmonic valve regurgitation is not visualized. No evidence of pulmonic stenosis.  Aorta: There is borderline dilatation of the aortic root, measuring 37 mm.  Venous: The inferior vena cava is normal in size with greater than 50% respiratory variability, suggesting right atrial pressure of 3 mmHg.  IAS/Shunts: No atrial level shunt detected by color flow Doppler.   LEFT VENTRICLE PLAX 2D LVIDd:         5.20 cm   Diastology LVIDs:         3.50 cm   LV e' medial:    5.48 cm/s LV PW:         0.90 cm   LV E/e' medial:  9.5 LV IVS:        1.00 cm   LV e' lateral:   6.53 cm/s LVOT diam:     2.10 cm   LV E/e' lateral: 8.0 LV SV:         80 LV SV Index:   37 LVOT Area:     3.46 cm   RIGHT VENTRICLE RV S  prime:     9.30 cm/s TAPSE (M-mode): 1.7 cm  LEFT ATRIUM  Index        RIGHT ATRIUM           Index LA diam:        4.40 cm 2.03 cm/m   RA Area:     16.60 cm LA Vol (A2C):   72.0 ml 33.28 ml/m  RA Volume:   44.90 ml  20.75 ml/m LA Vol (A4C):   58.0 ml 26.80 ml/m LA Biplane Vol: 65.0 ml 30.04 ml/m AORTIC VALVE                    PULMONIC VALVE AV Area (Vmax):    2.61 cm     PV Vmax:          0.86 m/s AV Area (Vmean):   2.59 cm     PV Peak grad:     2.9 mmHg AV Area (VTI):     2.64 cm     PR End Diast Vel: 6.45 msec AV Vmax:           124.00 cm/s AV Vmean:          82.800 cm/s AV VTI:            0.302 m AV Peak Grad:      6.2 mmHg AV Mean Grad:      3.0 mmHg LVOT Vmax:         93.60 cm/s LVOT Vmean:        62.000 cm/s LVOT VTI:          0.230 m LVOT/AV VTI ratio: 0.76  AORTA Ao Root diam: 3.70 cm Ao Asc diam:  3.50 cm  MITRAL VALVE MV Area (PHT): 2.80 cm    SHUNTS MV Decel Time: 271 msec    Systemic VTI:  0.23 m MV E velocity: 52.10 cm/s  Systemic Diam: 2.10 cm MV A velocity: 70.60 cm/s MV E/A ratio:  0.74  Kardie Tobb DO Electronically signed by Thomasene Ripple DO Signature Date/Time: 01/21/2023/3:05:25 PM    Final    MONITORS  LONG TERM MONITOR (3-14 DAYS) 09/23/2021  Narrative   Zio Patch Wear Time:  8 days and 12 hours (2023-05-18T07:53:20-0400 to 2023-05-26T20:45:22-0400)   Predominant Underlying Rhythm Was Sinus Rhythm: Heart rate range 45 to 135 bpm.  Average 73 bpm.   24 episodes of Supraventricular Tachycardia (SVT)/Atrial Tachycardia (atrial runs): Fastest was 11 beats at 184 bpm, longest was 13 beats at an average rate 92 bpm. => Episodes were noted on log.   Occasional PACs (1.3%) with rare couplets and triplets.  Rare PVCs.   Relatively benign findings.  Reviewed in clinic.  Bryan Lemma, MD   CT SCANS  CT CORONARY MORPH W/CTA COR W/SCORE 09/21/2019  Addendum 09/21/2019  6:16 PM ADDENDUM REPORT: 09/21/2019 18:14  CLINICAL DATA:   67 year old female with h/o hypertension, hyperlipidemia and dyspnea on exertion.  EXAM: Cardiac/Coronary  CTA  TECHNIQUE: The patient was scanned on a [employer].  FINDINGS: A 100 kV prospective scan was triggered in the descending thoracic aorta at 111 HU's. Axial non-contrast 3 mm slices were carried out through the heart. The data set was analyzed on a dedicated work station and scored using the Agatson method. Gantry rotation speed was 250 msecs and collimation was .6 mm. No beta blockade and 0.8 mg of sl NTG was given. The 3D data set was reconstructed in 5% intervals of the 67-82 % of the R-R cycle. Diastolic phases were analyzed on a dedicated work station using MPR, MIP and  VRT modes. The patient received 80 cc of contrast.  Aorta:  Normal size.  No calcifications.  No dissection.  Aortic Valve:  Trileaflet.  No calcifications.  Coronary Arteries:  Normal coronary origin.  Left dominance.  RCA is a small non-dominant artery.  Left main is a large artery that gives rise to LAD and LCX arteries. Left main has no plaque.  LAD is a large vessel that gives rise to one large diagonal artery and wraps around the apex. Proximal LAD has mild diffuse calcified plaque with stenosis 0-25%. Mid and distal LAD has only luminal irregularities.  D1 is a large artery with minimal plaque.  LCX is a large lumen dominant artery that gives rise to two large OM branches, PDA and PLA. There is minimal plaque.  OM1, OM2 are large branches with minimal irregularities.  PDA, PLA have minimal plaque.  Other findings:  Normal pulmonary vein drainage into the left atrium.  Normal left atrial appendage without a thrombus.  Normal size of the pulmonary artery.  IMPRESSION: 1. Coronary calcium score of 117. This was 82 percentile for age and sex matched control.  2. Normal coronary origin with left dominance.  3.  EXAM: Cardiac/Coronary  CTA  TECHNIQUE: The  patient was scanned on a [employer].  FINDINGS: A 100 kV prospective scan was triggered in the descending thoracic aorta at 111 HU's. Axial non-contrast 3 mm slices were carried out through the heart. The data set was analyzed on a dedicated work station and scored using the Agatson method. Gantry rotation speed was 250 msecs and collimation was .6 mm. No beta blockade and 0.8 mg of sl NTG was given. The 3D data set was reconstructed in 5% intervals of the 67-82 % of the R-R cycle. Diastolic phases were analyzed on a dedicated work station using MPR, MIP and VRT modes. The patient received 80 cc of contrast.  Aorta:  Normal size.  No calcifications.  No dissection.  Aortic Valve:  Trileaflet.  No calcifications.  Coronary Arteries:  Normal coronary origin.  Right dominance.  RCA is a large dominant artery that gives rise to PDA and PLA. There is no plaque.  Left main is a large artery that gives rise to LAD and LCX arteries.  LAD is a large vessel that has no plaque.  LCX is a non-dominant artery that gives rise to one large OM1 branch. There is no plaque.  Other findings:  Normal pulmonary vein drainage into the left atrium.  Normal left atrial appendage without a thrombus.  Normal size of the pulmonary artery.  IMPRESSION: 1. Coronary calcium score of 0. This was 0 percentile for age and sex matched control.  2. Normal coronary origin with right dominance.  3. CAD-RADS 1. Minimal non-obstructive CAD (0-24%). Consider non-atherosclerotic causes of chest pain. Consider preventive therapy and risk factor modification.   Electronically Signed By: Tobias Alexander On: 09/21/2019 18:14  Narrative EXAM: OVER-READ INTERPRETATION  CT CHEST  The following report is an over-read performed by radiologist Dr. Charlett Nose of [facility] on 09/21/2019. This over-read does not include interpretation of cardiac or coronary anatomy or pathology. The  coronary CTA interpretation by the cardiologist is attached.  COMPARISON:  12/10/2016  FINDINGS: Vascular: Heart is normal size.  Aorta normal caliber.  Mediastinum/Nodes: No adenopathy in the lower mediastinum or hila.  Lungs/Pleura: Lingular scarring. No confluent opacities or effusions.  Upper Abdomen: Imaging into the upper abdomen shows no acute findings.  Musculoskeletal: Chest  wall soft tissues are unremarkable. No acute bony abnormality.  IMPRESSION: No acute or significant extracardiac abnormality.  Electronically Signed: By: Charlett Nose M.D. On: 09/21/2019 16:03     ______________________________________________________________________________________________     Recent Labs: 01/15/2023: TSH 0.396 01/22/2023: Magnesium 2.0 06/28/2023: ALT 46; B Natriuretic Peptide 108.2; BUN 20; Creatinine, Ser 0.92; Hemoglobin 11.6; Platelets 269; Potassium 3.7; Sodium 142  Recent Lipid Panel    Component Value Date/Time   CHOL 156 01/21/2023 0650   TRIG 96 01/21/2023 0650   TRIG 70 02/18/2006 0933   HDL 57 01/21/2023 0650   CHOLHDL 2.7 01/21/2023 0650   VLDL 19 01/21/2023 0650   LDLCALC 80 01/21/2023 0650    History of Present Illness    68 year old female with the above past medical history including minimal nonobstructive CAD, PSVT, chronic diastolic heart failure, mild mitral valve regurgitation, mild dilation of the ascending aorta, hypertension, bilateral DVTs s/p IVC filter in 2009, OSA, and obesity.  Coronary CT angiogram in 2021 in the setting of chest tightness, shortness of breath, revealed coronary calcium score 117 (83rd percentile), minimal nonobstructive CAD.  Echocardiogram in 2022 showed EF 55 to 60%, grade 1 diastolic function, mildly dilated left atrium, mild mitral valve regurgitation, mild dilation of ascending aorta measuring 39 mm.  14-day Zio patch in 2023 in the setting of palpitations showed 24 episodes of SVT, longest episode lasting 13 beats, no  evidence of atrial fibrillation.  CPX in 2023 showed low normal functional capacity, no clear cardiopulmonary limitation.  Echocardiogram in 01/2023 showed EF 60 to 65%, no RWMA, G1 DD, normal RV systolic function.  She was last seen in the office on 03/14/2023 and reported worsening shortness of breath, mild orthopnea.  Torsemide was increased to 10 mg daily.  She noted planned follow-up with pulmonology.  She was evaluated in the ED on 06/28/2023 in the setting of shortness of breath.  She also noted symptoms of cough, general malaise, diarrhea and abdominal pain. CT of the abdomen pelvis was negative for acute process..  Chest x-ray revealed mild pulmonary edema.  BNP was minimally elevated at 108.  She had been recently started on Cipro for UTI.  She was advised to follow-up with her PCP to rule out possible C. difficile infection.  She was treated with a course of azithromycin, prednisone, Tessalon Perles her symptoms were felt to be due to mild bronchitis. She was discharged home in stable condition.   She presents today for follow-up accompanied by her sister.  Since her last visit she has been stable overall from a cardiac standpoint.  She does report some dietary indiscretion, she does not watch her salt intake.  She feels better when her weight is down.  Will have her take torsemide 10 mg daily.  She may take an additional 10 mg daily as needed for swelling, weight gain, shortness of breath.  Will check ABIs.  Will add potassium 20 mill equivalents daily to take with additional torsemide dosing.  Follow-up in 6 to 8 weeks.  Reviewed ongoing monitoring with daily weights.  1. Nonobstructive CAD: Coronary CT angiogram in 2021 in the setting of chest tightness, shortness of breath, revealed coronary calcium score 117 (83rd percentile), minimal nonobstructive CAD.   2. Palpitations/PSVT: 14-day Zio patch in 2023 in the setting of palpitations showed 24 episodes of SVT, longest episode lasting 13 beats, no  evidence of atrial fibrillation. Denies any recent palpitations.   3. Chronic diastolic heart failure/shortness of breath: Echocardiogram in 01/2023 showed EF  60 to 65%, no RWMA, G1 DD, normal RV systolic function.  4. Mild mitral valve regurgitation: Not appreciated on most recent echo.   5. Mild dilation of the ascending aorta: Noted on prior CT, not appreciated on most recent echo.  6. Hypertension: BP well controlled. Continue current antihypertensive regimen.   7. History of bilateral DVTs: S/p IVC filter.   8. OSA: Not currently on CPAP.  She has follow-up scheduled with pulmonology in April 2025, she will discuss this with them at this time.v  9. Disposition: Follow-up in 6-8 weeks.   Home Medications    Current Outpatient Medications  Medication Sig Dispense Refill   albuterol (VENTOLIN HFA) 108 (90 Base) MCG/ACT inhaler INHALE 2 PUFFS INTO THE LUNGS EVERY 6 HOURS AS NEEDED FOR WHEEZING OR SHORTNESS OF BREATH 3 each 2   amLODipine (NORVASC) 10 MG tablet Take 1 tablet (10 mg total) by mouth daily. 90 tablet 3   Ascorbic Acid (VITAMIN C) 1000 MG tablet Take 1,000 mg by mouth daily.     aspirin EC 81 MG tablet Take 1 tablet (81 mg total) by mouth daily. Swallow whole. 30 tablet 12   buPROPion (WELLBUTRIN XL) 300 MG 24 hr tablet TAKE 1 TABLET(300 MG) BY MOUTH DAILY 90 tablet 3   ferrous sulfate 325 (65 FE) MG tablet Take 1 tablet (325 mg total) by mouth daily with breakfast. 30 tablet 3   fluticasone (FLONASE) 50 MCG/ACT nasal spray Place 2 sprays into both nostrils daily. 16 g 6   Fluticasone-Umeclidin-Vilant (TRELEGY ELLIPTA) 100-62.5-25 MCG/ACT AEPB Inhale 1 puff into the lungs daily. 60 each 11   gabapentin (NEURONTIN) 100 MG capsule Take 2 capsules (200 mg total) by mouth 2 (two) times daily.     lipase/protease/amylase (CREON) 36000 UNITS CPEP capsule Take 36,000 Units by mouth 4 (four) times daily as needed (Stomach Pain).     montelukast (SINGULAIR) 10 MG tablet Take 1  tablet (10 mg total) by mouth at bedtime. 90 tablet 3   Multiple Vitamin (MULTI-VITAMIN) tablet Take 1 tablet by mouth daily.     pantoprazole (PROTONIX) 40 MG tablet Take 1 tablet (40 mg total) by mouth 2 (two) times daily before a meal. 60 tablet 3   PARoxetine (PAXIL) 20 MG tablet Take 1 tablet (20 mg total) by mouth daily. 90 tablet 3   VITAMIN D PO Take 1 capsule by mouth daily.     albuterol (PROVENTIL) (2.5 MG/3ML) 0.083% nebulizer solution Take 3 mLs (2.5 mg total) by nebulization every 6 (six) hours as needed for wheezing or shortness of breath. (Patient not taking: Reported on 07/12/2023) 100 mL 1   azithromycin (ZITHROMAX) 250 MG tablet Take 1 tablet (250 mg total) by mouth daily. Take first 2 tablets together, then 1 every day until finished. 6 tablet 0   benzonatate (TESSALON) 100 MG capsule Take 1 capsule (100 mg total) by mouth every 8 (eight) hours. 21 capsule 0   ipratropium (ATROVENT) 0.02 % nebulizer solution Take 2.5 mLs (0.5 mg total) by nebulization every 6 (six) hours as needed for wheezing or shortness of breath. (Patient not taking: Reported on 07/12/2023) 75 mL 12   loperamide (IMODIUM) 2 MG capsule Take 1 capsule (2 mg total) by mouth as needed for diarrhea or loose stools. (Patient not taking: Reported on 07/12/2023) 30 capsule 0   ondansetron (ZOFRAN) 4 MG tablet Take 1 tablet (4 mg total) by mouth every 8 (eight) hours as needed for nausea or vomiting. (Patient not taking: Reported  on 07/12/2023) 30 tablet 0   psyllium (HYDROCIL/METAMUCIL) 95 % PACK Take 1 packet by mouth 2 (two) times daily. (Patient taking differently: Take 1 packet by mouth 2 (two) times daily as needed for mild constipation.) 240 each    torsemide (DEMADEX) 20 MG tablet Take by mouth.     traMADol (ULTRAM) 50 MG tablet Take 1 tablet (50 mg total) by mouth every 6 (six) hours as needed for severe pain (pain score 7-10). (Patient not taking: Reported on 07/12/2023) 60 tablet 0   traZODone (DESYREL) 50 MG  tablet Take 1 tablet (50 mg total) by mouth at bedtime as needed for sleep. (Patient not taking: Reported on 07/12/2023) 90 tablet 1   No current facility-administered medications for this visit.     Review of Systems    ***.  All other systems reviewed and are otherwise negative except as noted above.    Physical Exam    VS:  BP 124/68 (BP Location: Left Arm, Patient Position: Sitting, Cuff Size: Large)   Pulse 70   Ht 5\' 5"  (1.651 m)   Wt 259 lb (117.5 kg)   SpO2 94%   BMI 43.10 kg/m  STOP-Bang Score:  7  { Consider Dx Sleep Disordered Breathing or Sleep Apnea  ICD G47.33          :1}    GEN: Well nourished, well developed, in no acute distress. HEENT: normal. Neck: Supple, no JVD, carotid bruits, or masses. Cardiac: RRR, no murmurs, rubs, or gallops. No clubbing, cyanosis, edema.  Radials/DP/PT 2+ and equal bilaterally.  Respiratory:  Respirations regular and unlabored, clear to auscultation bilaterally. GI: Soft, nontender, nondistended, BS + x 4. MS: no deformity or atrophy. Skin: warm and dry, no rash. Neuro:  Strength and sensation are intact. Psych: Normal affect.  Accessory Clinical Findings    ECG personally reviewed by me today - EKG Interpretation Date/Time:  Tuesday July 12 2023 13:51:42 EDT Ventricular Rate:  70 PR Interval:  128 QRS Duration:  88 QT Interval:  420 QTC Calculation: 453 R Axis:   56  Text Interpretation: Normal sinus rhythm Normal ECG When compared with ECG of 28-Jun-2023 21:30, PREVIOUS ECG IS PRESENT Confirmed by Bernadene Person ([id number]) on 07/12/2023 1:56:08 PM  - no acute changes.   Lab Results  Component Value Date   WBC 6.7 06/28/2023   HGB 11.6 (L) 06/28/2023   HCT 36.9 06/28/2023   MCV 85.6 06/28/2023   PLT 269 06/28/2023   Lab Results  Component Value Date   CREATININE 0.92 06/28/2023   BUN 20 06/28/2023   NA 142 06/28/2023   K 3.7 06/28/2023   CL 109 06/28/2023   CO2 23 06/28/2023   Lab Results  Component Value Date    ALT 46 (H) 06/28/2023   AST 31 06/28/2023   ALKPHOS 136 (H) 06/28/2023   BILITOT 0.4 06/28/2023   Lab Results  Component Value Date   CHOL 156 01/21/2023   HDL 57 01/21/2023   LDLCALC 80 01/21/2023   TRIG 96 01/21/2023   CHOLHDL 2.7 01/21/2023    Lab Results  Component Value Date   HGBA1C 5.9 (H) 01/21/2023    Assessment & Plan    1.  ***      Joylene Grapes, NP 07/12/2023, 1:59 PM

## 2023-07-12 NOTE — Patient Instructions (Addendum)
 Medication Instructions:  Take Torsemide 10 mg once a day, take an additional 10 mg once day as needed for SOB, weight gain of 3 lbs overnight or 5 lbs over a week. Start Potassium 20 meq daily as needed with the additional torsemide. *If you need a refill on your cardiac medications before your next appointment, please call your pharmacy*  Lab Work: No labs  Testing/Procedures: Your physician has requested that you have an ankle brachial index (ABI). During this test an ultrasound and blood pressure cuff are used to evaluate the arteries that supply the arms and legs with blood. Allow thirty minutes for this exam. There are no restrictions or special instructions. This will take place at [address] [facility], [address].   Please note: We ask at that you not bring children with you during ultrasound (echo/ vascular) testing. Due to room size and safety concerns, children are not allowed in the ultrasound rooms during exams. Our front office staff cannot provide observation of children in our lobby area while testing is being conducted. An adult accompanying a patient to their appointment will only be allowed in the ultrasound room at the discretion of the ultrasound technician under special circumstances. We apologize for any inconvenience.  Follow-Up: At [facility], you and your health needs are our priority.  As part of our continuing mission to provide you with exceptional heart care, we have created designated Provider Care Teams.  These Care Teams include your primary Cardiologist (physician) and Advanced Practice Providers (APPs -  Physician Assistants and Nurse Practitioners) who all work together to provide you with the care you need, when you need it.  We recommend signing up for the patient portal called "MyChart".  Sign up information is provided on this After Visit Summary.  MyChart is used to connect with patients for Virtual Visits (Telemedicine).  Patients are able to view  lab/test results, encounter notes, upcoming appointments, etc.  Non-urgent messages can be sent to your provider as well.   To learn more about what you can do with MyChart, go to [email or web address].    Your next appointment:   6 week(s)  Provider:   Bernadene Person, NP  Other Instructions

## 2023-07-13 ENCOUNTER — Encounter: Payer: Self-pay | Admitting: Nurse Practitioner

## 2023-07-24 NOTE — Progress Notes (Signed)
 Chief Complaint: No chief complaint on file.   History of Present Illness:  [name] is a 68 y.o. female who is seen in consultation from Hilbert Bible, FNP for evaluation of recurrent urinary tract infections.  She has been  treated for 3 urinary tract infections within the past 6 months.  Her most recent urine culture from 1 month ago revealed mixed urogenital organisms.  Prior to that, on 29 January she grew Aerococcus, on January 5 her culture was negative.   The patient denies recent febrile UTIs.  Her symptoms typically include frequency, urgency, urgency incontinence.  In the past, she has been seen for this incontinence as well as UTIs by Dr. Perley Jain at [facility] urology.  She was last seen at [facility] urology in 2021.  At that time she was treated for urgency incontinence as well as recurrent urinary tract infections.  Her recent symptoms were treated with Cipro.  She developed C. difficile after that.  I believe that culture at the time of treatment was negative.  She still has urgency frequency and urgency incontinence.  She also states that at times she feels like urine is coming from her anus.  Past Medical History:  Past Medical History:  Diagnosis Date   Allergy 2010   Anemia    takes Ferrous Sulfate daily   Anxiety    takes Citalopram daily   Arthritis    Asthma    2004-prior to gastric bypass and no problems since   CAP (community acquired pneumonia) 07/23/2021   CHF (congestive heart failure) (HCC)    takes Lasix daily as needed   Chronic back pain    spondylolisthesis/stenosis/radiculopathy   CKD (chronic kidney disease) stage 2, GFR 60-89 ml/min    Complication of anesthesia yrs ago   slow to wake up   Depression    Diabetes (HCC)    DVT (deep venous thrombosis) (HCC) 01/17/2013   past hx. -tx.5-6 yrs ago bilateral legs, occ. sporadic swelling, has IVC filter implanted   Dysrhythmia    "heart tends to flutter"   Fibromyalgia     Fracture    right foot and is in a cam boot   Gallstones    GERD (gastroesophageal reflux disease)    hx of-no meds now   HCAP (healthcare-associated pneumonia) 02/19/2023   Heart murmur    History of bronchitis 2012 or 2013   History of colon polyps    Hypertension    takes Metoprolol daily   Insomnia    takes Melatonin daily   Microalbuminuria    Pelvic floor dysfunction    Peripheral neuropathy    Pneumonia 90's   hx of   Recurrent Clostridioides difficile infection 02/28/2020   S/P gastric bypass 2003   Sleep apnea    no cpap used in many yrs after weight lost-no machine now   Urinary frequency    Urinary urgency    Vitamin D deficiency     Past Surgical History:  Past Surgical History:  Procedure Laterality Date   BALLOON DILATION N/A 01/31/2013   Procedure: BALLOON DILATION;  Surgeon: Willis Modena, MD;  Location: [facility];  Service: Endoscopy;  Laterality: N/A;   CARDIAC EVENT MONITOR  03/2019   Predominantly sinus rhythm.  Rates range from 48-124 bpm.  Average 74 bpm.  Frequent short bursts (3-15 beats) PAT/PSVT--not indicated as being symptomatic on diary.  Otherwise rare PACs and PVCs.   CARDIAC EVENT MONITOR  08/2021   14-Day Zio Patch: Predominantly  sinus rhythm with rate range 45 to 135 bpm.  Average 73 bpm.  Occasional (1.3%) PACs with rare PVCs and rare PAC couplets.  24 episodes of atrial runs: Fastest was 11 beats at a rate of 184 bpm, longest was 13 beats with a rate of 92 bpm.  No sustained tachyarrhythmias or bradycardia arrhythmias.  No atrial fibrillation or flutter.   CARDIOPULMONARY EXERCISE STRESS TEST (CPX)  10/27/2021   Good effort despite dyspnea and wheezing.  Modified Naughton protocol on treadmill.  Normal heart rate response.  No sustained arrhythmias.  Preexercise spirometry normal.  Low normal peak VO2. Low normal functional capacity with NO Clear Cardiopulmonary Limitation.  Overall limitation primarily due to obesity & deconditioning,  along with a component of Diastolic Dysfunction   CHOLECYSTECTOMY  2007   COLONOSCOPY     CT CTA CORONARY W/CA SCORE W/CM &/OR WO/CM  09/21/2019   Coronary Calcium Score 0.  Normal RCA dominant coronary anatomy.  CAD RADS 1-minimal nonobstructive CAD (0-24%).  Consider nonatherosclerotic cause for chest pain.  Consider preventative therapy and risk factor modification.   DILATION AND CURETTAGE OF UTERUS  yrs ago   ESOPHAGOGASTRODUODENOSCOPY (EGD) WITH PROPOFOL  03/01/2012   Procedure: ESOPHAGOGASTRODUODENOSCOPY (EGD) WITH PROPOFOL;  Surgeon: Willis Modena, MD;  Location: [facility];  Service: Endoscopy;  Laterality: N/A;   ESOPHAGOGASTRODUODENOSCOPY (EGD) WITH PROPOFOL N/A 01/31/2013   Procedure: ESOPHAGOGASTRODUODENOSCOPY (EGD) WITH PROPOFOL;  Surgeon: Willis Modena, MD;  Location: [facility];  Service: Endoscopy;  Laterality: N/A;   ESOPHAGOGASTRODUODENOSCOPY (EGD) WITH PROPOFOL N/A 09/02/2015   Procedure: ESOPHAGOGASTRODUODENOSCOPY (EGD) WITH PROPOFOL;  Surgeon: Iva Boop, MD;  Location: [facility];  Service: Endoscopy;  Laterality: N/A;   FRACTURE SURGERY     GASTRIC BY-PASS  2004   HERNIA REPAIR  2005   INSERTION OF VENA CAVA FILTER  01/17/2013   inserted 2004- "abdomen"   LIGAMENT REPAIR Right 1987   Rt. knee scope   MAXIMUM ACCESS (MAS)POSTERIOR LUMBAR INTERBODY FUSION (PLIF) 2 LEVEL N/A 06/07/2014   Procedure: L4-5 L5-S1 FOR MAXIMUM ACCESS (MAS) POSTERIOR LUMBAR INTERBODY FUSION ;  Surgeon: Maeola Harman, MD;  Location: [facility];  Service: Neurosurgery;  Laterality: N/A;  L4-5 L5-S1 FOR MAXIMUM ACCESS (MAS) POSTERIOR LUMBAR INTERBODY FUSION    SPINE SURGERY  2017   TONSILLECTOMY     as child   TRANSTHORACIC ECHOCARDIOGRAM  12/2020   EF 55 to 60%.  No RWMA.  Mild LVH.  GR 1 DD-moderate LA dilation..  Mild MR..  Normal RV size/function.  Normal PAP.  Normal RAP. => Essentially stable from December 2020    Allergies:  Allergies  Allergen Reactions   Macrobid  [Nitrofurantoin] Other (See Comments)    C.Diff Infection   Carafate [Sucralfate] Hives, Itching and Other (See Comments)    Angioedema      Sulfonamide Derivatives Rash    Syncope   Infed [Iron Dextran]     Family History:  Family History  Problem Relation Age of Onset   Diabetes Mother    Atrial fibrillation Mother    Hypertension Mother    Arthritis Mother    Heart disease Father        Unsure of details   Hypertension Father    Prostate cancer Father    Alzheimer's disease Father    Arthritis Father    Cancer Father    Kidney disease Brother    Cancer Brother    Healthy Sister    Diabetes Maternal Grandmother    Heart disease  Maternal Grandmother    Vision loss Maternal Grandmother    Stroke Maternal Grandfather 50   Hypertension Maternal Grandfather    Hypertension Paternal Grandmother    Alzheimer's disease Paternal Grandmother    Colon cancer Neg Hx    Colon polyps Neg Hx    Stomach cancer Neg Hx    Esophageal cancer Neg Hx    Pancreatic cancer Neg Hx    Liver disease Neg Hx     Social History:  Social History   Tobacco Use   Smoking status: Former    Current packs/day: 0.00    Average packs/day: 0.5 packs/day for 10.0 years (5.0 ttl pk-yrs)    Types: Cigarettes    Start date: 04/20/1999    Quit date: 04/19/2009    Years since quitting: 14.2   Smokeless tobacco: Never  Vaping Use   Vaping status: Never Used  Substance Use Topics   Alcohol use: Not Currently    Comment: occ. social- wine-1 drink monthly   Drug use: No    Review of symptoms:  Constitutional:  Negative for unexplained weight loss, night sweats, fever, chills ENT:  Negative for nose bleeds, sinus pain, painful swallowing CV:  Negative for chest pain, shortness of breath, exercise intolerance, palpitations, loss of consciousness Resp:  Negative for cough, wheezing, shortness of breath GI:  Negative for nausea, vomiting, diarrhea, bloody stools GU:  Positives noted in HPI; otherwise  negative for gross hematuria, dysuria, urinary incontinence Neuro:  Negative for seizures, poor balance, limb weakness, slurred speech Psych:  Negative for lack of energy, depression, anxiety Endocrine:  Negative for polydipsia, polyuria, symptoms of hypoglycemia (dizziness, hunger, sweating) Hematologic:  Negative for anemia, purpura, petechia, prolonged or excessive bleeding, use of anticoagulants  Allergic:  Negative for difficulty breathing or choking as a result of exposure to anything; no shellfish allergy; no allergic response (rash/itch) to materials, foods  Physical exam: There were no vitals taken for this visit. GENERAL APPEARANCE:  Well appearing, well developed, well nourished, NAD HEENT: Atraumatic, Normocephalic. NECK: Normal in appearance LUNGS: Dyspnea at rest HEART: Regular Rate. ABDOMEN: Obese EXTREMITIES: Moves all extremities well.  Without clubbing, cyanosis, or edema. NEUROLOGIC:  Alert and oriented x 3, normal gait, CN II-XII grossly intact.  MENTAL STATUS:  Appropriate. SKIN:  Warm, dry and intact.    Results: No results found. However, due to the size of the patient record, not all encounters were searched. Please check Results Review for a complete set of results.  I have reviewed prior patient's records  I have reviewed referring/prior physicians records--PCP and [facility]   I have reviewed prior urine cultures  I reviewed prior imaging studies-CT abdomen and pelvis with contrast in March of this year for diarrhea and left lower quadrant abdominal pain revealed small left renal cyst, simple in nature.  No other GU abnormalities.  Assessment: 1.  History of cystitis, although I think some of her symptoms were not associated with cystitis even though she was treated with antibiotics  2.  Urgency, urgency incontinence--OAB, persistent  3.  Vaginal atrophic changes secondary to age   Plan: 1.  I let the patient know that not all urinary symptoms  are related to cystitis and she should be very careful with taking antibiotics  2.  I will start her on vaginal estrogen cream 2 nights a week  3.  Regarding her complaints of urine coming from her anus, I think more than likely it is from obesity with urine directed downward with  voiding.  I instructed her to spread her legs when she does void  4.  I will have her come back in about 2 months to check symptoms  5.  She was given an overactive bladder guide sheet

## 2023-07-25 ENCOUNTER — Ambulatory Visit (INDEPENDENT_AMBULATORY_CARE_PROVIDER_SITE_OTHER): Admitting: Urology

## 2023-07-25 DIAGNOSIS — N3020 Other chronic cystitis without hematuria: Secondary | ICD-10-CM

## 2023-07-25 DIAGNOSIS — N3941 Urge incontinence: Secondary | ICD-10-CM | POA: Diagnosis not present

## 2023-07-25 DIAGNOSIS — N952 Postmenopausal atrophic vaginitis: Secondary | ICD-10-CM | POA: Diagnosis not present

## 2023-07-25 DIAGNOSIS — Z87440 Personal history of urinary (tract) infections: Secondary | ICD-10-CM | POA: Diagnosis not present

## 2023-07-25 DIAGNOSIS — N3281 Overactive bladder: Secondary | ICD-10-CM

## 2023-07-25 DIAGNOSIS — N281 Cyst of kidney, acquired: Secondary | ICD-10-CM

## 2023-07-25 MED ORDER — ESTRADIOL 0.1 MG/GM VA CREA
TOPICAL_CREAM | VAGINAL | 3 refills | Status: DC
Start: 2023-07-25 — End: 2023-08-24

## 2023-07-25 NOTE — Addendum Note (Signed)
 Addended by: Carolin Coy on: 07/25/2023 01:09 PM   Modules accepted: Orders

## 2023-07-26 ENCOUNTER — Other Ambulatory Visit: Payer: Self-pay

## 2023-07-26 DIAGNOSIS — J441 Chronic obstructive pulmonary disease with (acute) exacerbation: Secondary | ICD-10-CM

## 2023-07-26 NOTE — Patient Outreach (Addendum)
 Complex Care Management   Visit Note  07/26/2023  Name:  [name] MRN: [id number] DOB: September 21, 1955  Situation: Referral received for Complex Care Management related to Heart Failure and Diabetes with Complications I obtained verbal consent from patient.  Visit completed with patient  on the phone  Background:   Past Medical History:  Diagnosis Date   Allergy 2010   Anemia    takes Ferrous Sulfate daily   Anxiety    takes Citalopram daily   Arthritis    Asthma    2004-prior to gastric bypass and no problems since   CAP (community acquired pneumonia) 07/23/2021   CHF (congestive heart failure) (HCC)    takes Lasix daily as needed   Chronic back pain    spondylolisthesis/stenosis/radiculopathy   CKD (chronic kidney disease) stage 2, GFR 60-89 ml/min    Complication of anesthesia yrs ago   slow to wake up   Depression    Diabetes (HCC)    DVT (deep venous thrombosis) (HCC) 01/17/2013   past hx. -tx.5-6 yrs ago bilateral legs, occ. sporadic swelling, has IVC filter implanted   Dysrhythmia    "heart tends to flutter"   Fibromyalgia    Fracture    right foot and is in a cam boot   Gallstones    GERD (gastroesophageal reflux disease)    hx of-no meds now   HCAP (healthcare-associated pneumonia) 02/19/2023   Heart murmur    History of bronchitis 2012 or 2013   History of colon polyps    Hypertension    takes Metoprolol daily   Insomnia    takes Melatonin daily   Microalbuminuria    Pelvic floor dysfunction    Peripheral neuropathy    Pneumonia 90's   hx of   Recurrent Clostridioides difficile infection 02/28/2020   S/P gastric bypass 2003   Sleep apnea    no cpap used in many yrs after weight lost-no machine now   Urinary frequency    Urinary urgency    Vitamin D deficiency     Assessment: Patient Reported Symptoms:  Cognitive Alert and oriented to person, place, and time  Neurological No symptoms reported    HEENT Nasal discharge (seasonal allergies  treated with medication)    Cardiovascular No symptoms reported    Respiratory Shortness of breath, Wheezing dyspnea upon exertion, treated with medication therapy  Endocrine No symptoms reported    Gastrointestinal No symptoms reported    Genitourinary Frequency, Incontinence, Pain with urination    Integumentary No symptoms reported, Bruising    Musculoskeletal Difficulty walking    Psychosocial No symptoms reported     There were no vitals filed for this visit.  Medications Reviewed Today     Reviewed by Ruel Favors, RN (Registered Nurse) on 07/26/23 at 1106  Med List Status: <None>   Medication Order Taking? Sig Documenting Provider Last Dose Status Informant  albuterol (PROVENTIL) (2.5 MG/3ML) 0.083% nebulizer solution [id number] Yes Take 3 mLs (2.5 mg total) by nebulization every 6 (six) hours as needed for wheezing or shortness of breath. Hilbert Bible, FNP Taking Active   albuterol (VENTOLIN HFA) 108 (90 Base) MCG/ACT inhaler [id number] Yes INHALE 2 PUFFS INTO THE LUNGS EVERY 6 HOURS AS NEEDED FOR WHEEZING OR SHORTNESS OF Elray Buba, MD Taking Active Self, Pharmacy Records  amLODipine (NORVASC) 10 MG tablet [id number] Yes Take 1 tablet (10 mg total) by mouth daily. Marinda Elk, MD Taking Active   Ascorbic Acid (VITAMIN C) 1000 MG  tablet [id number] Yes Take 1,000 mg by mouth daily. [provider] Taking Active Self, Pharmacy Records  aspirin EC 81 MG tablet [id number] Yes Take 1 tablet (81 mg total) by mouth daily. Swallow whole. Pokhrel, Rebekah Chesterfield, MD Taking Active   buPROPion (WELLBUTRIN XL) 300 MG 24 hr tablet [id number] Yes TAKE 1 TABLET(300 MG) BY MOUTH DAILY Caudle, Shelton Silvas, FNP Taking Active   estradiol (ESTRACE) 0.1 MG/GM vaginal cream [id number] No Use a pea-sized amount on your index finger and apply to vaginal opening 2 nights a week  Patient not taking: Reported on 07/26/2023   Marcine Matar, MD Not Taking Active             Med Note Julian Reil, Toia Micale   Tue Jul 26, 2023 10:58 AM) Hasn't yet picked up from pharmacy, just prescribed yesterday  ferrous sulfate 325 (65 FE) MG tablet [id number] Yes Take 1 tablet (325 mg total) by mouth daily with breakfast. Marinda Elk, MD Taking Active   fluticasone [facility]) 50 MCG/ACT nasal spray [id number] Yes Place 2 sprays into both nostrils daily. Hilbert Bible, FNP Taking Active   Fluticasone-Umeclidin-Vilant (TRELEGY ELLIPTA) 100-62.5-25 MCG/ACT AEPB [id number] Yes Inhale 1 puff into the lungs daily. Hilbert Bible, FNP Taking Active   gabapentin (NEURONTIN) 100 MG capsule [id number] Yes Take 2 capsules (200 mg total) by mouth 2 (two) times daily. Franne Forts, PA-C Taking Active Self, Pharmacy Records           Med Note Julian Reil, Zerrick Hanssen   Tue Jul 26, 2023 11:02 AM) Patient taking 100 mg capsule one in am, one at bedtime  ipratropium (ATROVENT) 0.02 % nebulizer solution [id number] Yes Take 2.5 mLs (0.5 mg total) by nebulization every 6 (six) hours as needed for wheezing or shortness of breath. Hilbert Bible, FNP Taking Active   lipase/protease/amylase (CREON) 36000 UNITS CPEP capsule [id number] Yes Take 36,000 Units by mouth 4 (four) times daily as needed (Stomach Pain). [provider] Taking Active Self, Pharmacy Records  loperamide (IMODIUM) 2 MG capsule [id number] Yes Take 1 capsule (2 mg total) by mouth as needed for diarrhea or loose stools. Meuth, Lina Sar, PA-C Taking Active Self, Pharmacy Records  montelukast (SINGULAIR) 10 MG tablet [id number] Yes Take 1 tablet (10 mg total) by mouth at bedtime. Myrlene Broker, MD Taking Active Self, Pharmacy Records  Multiple Vitamin (MULTI-VITAMIN) tablet [id number] Yes Take 1 tablet by mouth daily. [provider] Taking Active Self, Pharmacy Records  ondansetron [facility]) 4 MG tablet [id number] Yes Take 1 tablet (4 mg total) by mouth every 8 (eight) hours as needed for nausea or vomiting.  Rai, Delene Ruffini, MD Taking Active   pantoprazole (PROTONIX) 40 MG tablet [id number] Yes Take 1 tablet (40 mg total) by mouth 2 (two) times daily before a meal. Charlott Holler, MD Taking Active   PARoxetine (PAXIL) 20 MG tablet [id number] Yes Take 1 tablet (20 mg total) by mouth daily. Myrlene Broker, MD Taking Active Self, Pharmacy Records  potassium chloride SA (KLOR-CON M) 20 MEQ tablet [id number] Yes Take 1 tablet (20 mEq total) by mouth as needed (Take on tablet once a day as needed witht he aditional dose of torsemide.). Joylene Grapes, NP Taking Active   torsemide (DEMADEX) 10 MG tablet [id number] Yes Take 1 tablet (10 mg total) by mouth daily. Take an additional 10 mg once a day for Lower extremity swelling, SOB, weight gain of 3 lbs overnight and 5 lbs over a weeks. Joylene Grapes,  NP Taking Active   traMADol (ULTRAM) 50 MG tablet [id number] Yes Take 1 tablet (50 mg total) by mouth every 6 (six) hours as needed for severe pain (pain score 7-10). Hilbert Bible, FNP Taking Active   traZODone (DESYREL) 50 MG tablet [id number] Yes Take 1 tablet (50 mg total) by mouth at bedtime as needed for sleep. Myrlene Broker, MD Taking Active Self, Pharmacy Records  VITAMIN D PO [id number] Yes Take 1 capsule by mouth daily. [provider] Taking Active Self, Pharmacy Records            Recommendation:   PCP Follow-up Discuss referral to new PCP Schedule appointment with Dr Charlsie Merles [facility]) for foot exam. Refereral placed to Clinical Pharmacist for medication assistance. Follow Up Plan:   Telephone follow-up in two weeks   Ruel Favors BSN RN CCM [place]  [facility] RN Care Manager Direct Dial: [phone] Fax: [phone]

## 2023-07-26 NOTE — Patient Instructions (Signed)
 Visit Information  Thank you for taking time to visit with me today. Please don't hesitate to contact me if I can be of assistance to you before our next scheduled telephone appointment.  Our next appointment is by telephone on 08/09/23  at 1:00pm  Following is a copy of your care plan:   Goals Addressed             This Visit's Progress    VBCI RN Care Plan   On track    Problems:  Chronic Disease Management support and education needs related to CHF and DMII Difficulty obtaining medications Financial Constraints. Transportation barriers  Goal: Over the next 2 weeks the Patient will attend all scheduled medical appointments:   as evidenced by chart review and patient report        continue to work with Medical illustrator and/or Social Worker to address care management and care coordination needs related to CHF and DMII as evidenced by adherence to CM Team Scheduled appointments     demonstrate Improved health management independence as evidenced by tracking daily weights, and regular blood sugar monitoring        demonstrate ongoing self health care management ability   as evidenced by    attending all appointments as scheduled, medication adherence.  Interventions:   Heart Failure Interventions:  (Status:  Goal on track:  Yes.) Short Term Goal Basic overview and discussion of pathophysiology of Heart Failure reviewed Provided education on low sodium diet Assessed need for readable accurate scales in home Provided education about placing scale on hard, flat surface Advised patient to weigh each morning after emptying bladder Discussed importance of daily weight and advised patient to weigh and record daily Reviewed role of diuretics in prevention of fluid overload and management of heart failure; Discussed the importance of keeping all appointments with provider Provided patient with education about the role of exercise in the management of heart failure Referral made to  community resources care guide team for assistance with transportation barriers; Advised patient to discuss referral to Internal Medicine PCP with provider Assessed social determinant of health barriers   Patient Self-Care Activities:  Attend all scheduled provider appointments Attend church or other social activities Call pharmacy for medication refills 3-7 days in advance of running out of medications Call provider office for new concerns or questions  Perform all self care activities independently  Take medications as prescribed   Work with the social worker to address care coordination needs and will continue to work with the clinical team to address health care and disease management related needs Work with the pharmacist to address medication management needs and will continue to work with the clinical team to address health care and disease management related needs  Plan:  Next PCP appointment scheduled for:  Telephone follow up appointment with care management team member scheduled for:  08/09/23 @ 1:00             Patient verbalizes understanding of instructions and care plan provided today and agrees to view in MyChart. Active MyChart status and patient understanding of how to access instructions and care plan via MyChart confirmed with patient.     Telephone follow up appointment with care management team member scheduled for:08/09/23 @ 1:00  Please call the care guide team at [phone] if you need to cancel or reschedule your appointment.   Please call the Suicide and Crisis Lifeline: 988 call the [place] National Suicide Prevention Lifeline: 276-462-0004 or TTY: 250-738-6540 TTY 708-342-5586)  to talk to a trained counselor call 1-800-273-TALK (toll free, 24 hour hotline) if you are experiencing a Mental Health or Behavioral Health Crisis or need someone to talk to.    Ruel Favors BSN RN CCM [place]  [facility], [facility] RN Care  Manager Direct Dial: [phone] Fax: [phone]

## 2023-07-27 ENCOUNTER — Ambulatory Visit (HOSPITAL_COMMUNITY)
Admission: RE | Admit: 2023-07-27 | Discharge: 2023-07-27 | Disposition: A | Discharge status: Home / Self Care | Source: Ambulatory Visit | Attending: Nurse Practitioner | Admitting: Nurse Practitioner

## 2023-07-27 DIAGNOSIS — I340 Nonrheumatic mitral (valve) insufficiency: Secondary | ICD-10-CM | POA: Insufficient documentation

## 2023-07-27 DIAGNOSIS — R002 Palpitations: Secondary | ICD-10-CM | POA: Diagnosis not present

## 2023-07-27 DIAGNOSIS — M79604 Pain in right leg: Secondary | ICD-10-CM | POA: Diagnosis not present

## 2023-07-27 DIAGNOSIS — Z86718 Personal history of other venous thrombosis and embolism: Secondary | ICD-10-CM | POA: Insufficient documentation

## 2023-07-27 DIAGNOSIS — G4733 Obstructive sleep apnea (adult) (pediatric): Secondary | ICD-10-CM | POA: Diagnosis present

## 2023-07-27 DIAGNOSIS — M79605 Pain in left leg: Secondary | ICD-10-CM

## 2023-07-27 DIAGNOSIS — I77810 Thoracic aortic ectasia: Secondary | ICD-10-CM

## 2023-07-27 DIAGNOSIS — I10 Essential (primary) hypertension: Secondary | ICD-10-CM | POA: Diagnosis not present

## 2023-07-27 DIAGNOSIS — I2510 Atherosclerotic heart disease of native coronary artery without angina pectoris: Secondary | ICD-10-CM | POA: Insufficient documentation

## 2023-07-27 DIAGNOSIS — I5032 Chronic diastolic (congestive) heart failure: Secondary | ICD-10-CM | POA: Insufficient documentation

## 2023-07-28 ENCOUNTER — Telehealth: Payer: Self-pay

## 2023-07-28 ENCOUNTER — Telehealth: Payer: Self-pay | Admitting: Licensed Clinical Social Worker

## 2023-07-28 LAB — VAS US ABI WITH/WO TBI
Left ABI: 1.13
Right ABI: 1.13

## 2023-07-28 NOTE — Progress Notes (Signed)
 Heart and Vascular Care Navigation  07/28/2023  [name] July 27, 1955 [id number]  Reason for Referral: home assistance and community resources Patient is participating in a Managed Medicaid Plan: No, [employer] Medicare only  Engaged with patient by telephone for initial visit for Heart and Vascular Care Coordination.                                                                                                   Assessment:         LCSW was able to reach pt today at [phone]. Introduced self, role, reason for call. Pt confirmed home address, PCP (working on possibly establishing with a new provider that can handle her chronic care more closely), and emergency contacts. Pt lives alone, uses a rollator to ambulate. She has family and friends that assist her with transportation and other things as needed. Receives Social Security only, no Medicaid or other benefits. Reviewed previous BSW engagement through [facility]. She owns her home, denies any issues with that currently but is behind on multiple utilities. She has a hard time managing assistance program applications since she doesn't have transportation. She applied for [employer] online but "didn't hear anything back," also never heard back anything about [employer] etc this year. She has access to food and basics but struggles financially with medications and medical bills as well at this time.   We discussed that I could follow up on several resources provided by BSW including [employer] and recommended she call [facility] for concerns regarding medication and medical costs as she should be eligible for Extra Help program and Medicare Savings Program. I will also assess for any additional assistance we may be able to help with for utilities.  Allowed pt space to decompress about multiple chronic health issues. She is short of breath on the phone and frustrated by being winded. Is aware of how to seek care if needed, has upcoming pulmonology appt but  feels she could use some additional help in home. Only has Medicare and not Medicaid so we discussed how to access private pay options or to request re-authorization for more home health services if eligible.   Pt encouraged to call me with any additional questions or concerns. I will f/u on resources discussed as well                               HRT/VAS Care Coordination     Patients Home Cardiology Office [facility]   Outpatient Care Team Social Worker   Social Worker Name: Octavio Graves, [place], [phone]   Living arrangements for the past 2 months Single Family Home   Lives with: Self   Patient Current Insurance Coverage Managed Medicare   Patient Has Concern With Paying Medical Bills Yes   Patient Concerns With Medical Bills ongoing medical costs on a small budget   Medical Bill Referrals: referred to [facility] for Medicare Savings Program and billing for payment plan   Does Patient Have Prescription Coverage? Yes  referred to [facility] for Extra Help/LIS program  Home Assistive Devices/Equipment Blood pressure cuff; Built-in shower seat; CBG Meter; Dentures (specify type); Grab bars in shower; Nebulizer; Raised toilet seat with rails; Scales; Walker (specify type)  full upper and lower dentures   DME Agency NA   [facility] Agency [facility] Care   Current home services DME; Home OT; Home PT; Other (comment)  cane, walker, wheelchair; BSC, shower chair; HHPT/OT/Aide w/ Frances Furbish       Social History:                                                                             SDOH Screenings   Food Insecurity: Food Insecurity Present (07/28/2023)  Housing: Low Risk  (07/28/2023)  Transportation Needs: Unmet Transportation Needs (07/28/2023)  Utilities: At Risk (07/28/2023)  Alcohol Screen: Low Risk  (02/14/2023)  Depression (PHQ2-9): Low Risk  (06/22/2023)  Recent Concern: Depression (PHQ2-9) - Medium Risk (05/17/2023)  Financial Resource Strain: High Risk (07/28/2023)  Physical  Activity: Inactive (04/01/2023)  Social Connections: Moderately Integrated (04/01/2023)  Stress: Stress Concern Present (07/28/2023)  Tobacco Use: Medium Risk (07/13/2023)  Health Literacy: Adequate Health Literacy (07/28/2023)    SDOH Interventions: Financial Resources:  Surveyor, quantity Strain Interventions: [employer] Provided DSS for financial assistance; urban Engineer, production Insecurity:  Food Insecurity Interventions: [employer] Provided- Environmental manager, [place] Location manager, is on waitlist for [employer] on [employer] Insecurity:  Housing Interventions: Intervention Not Indicated  Transportation:   Transportation Interventions: [employer] ([employer]), [employer] Provided- will f/u on previous application    Other Care Navigation Interventions:     Provided Pharmacy assistance resources  Referred to [facility] for Extra Help application and assistance  Patient expressed Mental Health concerns Yes, Referred to:  sees provider- will f/u to ensure has resources and is engaged ongoing with that provider for counseling   Follow-up plan:   LCSW mailed the following resources to pt: [employer], personal care services list, Programme researcher, broadcasting/film/video and Pensions consultant, Designer, television/film set, urban Mudlogger. Will f/u with [employer] (formerly [employer]) for past application. Pt was provided with number for [facility] (is familiar with Geographical information systems officer and [place] at agency. Will f/u to provide additional support and resources as needed.

## 2023-08-01 ENCOUNTER — Other Ambulatory Visit: Payer: Self-pay | Admitting: Internal Medicine

## 2023-08-01 ENCOUNTER — Other Ambulatory Visit: Payer: Self-pay | Admitting: Nurse Practitioner

## 2023-08-01 NOTE — Progress Notes (Signed)
 Complex Care Management Note  Care Guide Note 08/01/2023 Name: [name] MRN: [id number] DOB: [birth year]  [name] is a 67 y.o. year old female who sees Caudle, Ar[place]a Kocher, FNP for primary care. I reached out to Alexia Idler by phone today to offer complex care management services.  Ms. Stimson was given information about Complex Care Management services today including:   The Complex Care Management services include support from the care team which includes your Nurse Care Manager, Clinical Social Worker, or Pharmacist.  The Complex Care Management team is here to help remove barriers to the health concerns and goals most important to you. Complex Care Management services are voluntary, and the patient may decline or stop services at any time by request to their care team member.   Complex Care Management Consent Status: Patient agreed to services and verbal consent obtained.   Follow up plan:  Telephone appointment with complex care management team member scheduled for:  08/11/23 at 2:00 p.m.  Encounter Outcome:  Patient Scheduled  [facility]  [facility], [facility] Health Care Management Assistant Direct Dial: [phone]  Fax: [phone]

## 2023-08-02 ENCOUNTER — Telehealth: Payer: Self-pay | Admitting: Licensed Clinical Social Worker

## 2023-08-02 ENCOUNTER — Other Ambulatory Visit (HOSPITAL_BASED_OUTPATIENT_CLINIC_OR_DEPARTMENT_OTHER): Payer: Self-pay | Admitting: *Deleted

## 2023-08-02 DIAGNOSIS — B9689 Other specified bacterial agents as the cause of diseases classified elsewhere: Secondary | ICD-10-CM

## 2023-08-02 MED ORDER — AMLODIPINE BESYLATE 10 MG PO TABS: 10.0000 mg | ORAL_TABLET | Freq: Every day | ORAL | 3 refills | Status: AC

## 2023-08-02 MED ORDER — ASPIRIN 81 MG PO TBEC: 81.0000 mg | DELAYED_RELEASE_TABLET | Freq: Every day | ORAL | 12 refills | Status: AC

## 2023-08-02 MED ORDER — ALBUTEROL SULFATE (2.5 MG/3ML) 0.083% IN NEBU: 2.5000 mg | INHALATION_SOLUTION | Freq: Four times a day (QID) | RESPIRATORY_TRACT | 1 refills | Status: DC | PRN

## 2023-08-02 MED ORDER — PAROXETINE HCL 20 MG PO TABS: 20.0000 mg | ORAL_TABLET | Freq: Every day | ORAL | 3 refills | Status: AC

## 2023-08-02 MED ORDER — FLUTICASONE PROPIONATE 50 MCG/ACT NA SUSP: 2.0000 | Freq: Every day | NASAL | 6 refills | Status: DC

## 2023-08-02 NOTE — Telephone Encounter (Signed)
 Please advise,don't see in meds list

## 2023-08-02 NOTE — Telephone Encounter (Signed)
 H&V Care Navigation CSW Progress Note  Clinical Social Worker contacted patient by phone to f/u on [employer] application and community resources. When I first called pt needed a moment to go get water and states she will call me back. I didn't hear back from her so I called her and was able to reach her again. Shared I have sent [employer] application to PCP team since she has an appt there tomorrow. She is working on a ride there and will let me know if any changes to plan. She is aware of pulmonology appt on 4/21. Pt shares the pollen she thinks is making things worse for her breathing today.   We discussed if any concerns to reach out to the pulmonology team or to proceed to the emergency department to seek care.  Patient is participating in a Managed Medicaid Plan:  No, [employer] Medicare only   SDOH Screenings   Food Insecurity: Food Insecurity Present (07/28/2023)  Housing: Low Risk  (07/28/2023)  Transportation Needs: Unmet Transportation Needs (07/28/2023)  Utilities: At Risk (07/28/2023)  Alcohol Screen: Low Risk  (02/14/2023)  Depression (PHQ2-9): Low Risk  (06/22/2023)  Recent Concern: Depression (PHQ2-9) - Medium Risk (05/17/2023)  Financial Resource Strain: High Risk (07/28/2023)  Physical Activity: Inactive (04/01/2023)  Social Connections: Moderately Integrated (04/01/2023)  Stress: Stress Concern Present (07/28/2023)  Tobacco Use: Medium Risk (07/13/2023)  Health Literacy: Adequate Health Literacy (07/28/2023)    Nathen Balder, MSW, LCSW Clinical Social Worker II [facility] Health Heart/Vascular Care Navigation  [phone]- work cell phone (preferred) [phone]- desk phone

## 2023-08-03 ENCOUNTER — Encounter (HOSPITAL_BASED_OUTPATIENT_CLINIC_OR_DEPARTMENT_OTHER): Payer: Self-pay | Admitting: Family Medicine

## 2023-08-03 ENCOUNTER — Telehealth: Payer: Self-pay | Admitting: Cardiology

## 2023-08-03 ENCOUNTER — Ambulatory Visit (INDEPENDENT_AMBULATORY_CARE_PROVIDER_SITE_OTHER): Payer: Medicare HMO | Admitting: Family Medicine

## 2023-08-03 VITALS — BP 122/89 | HR 68 | Ht 65.0 in | Wt 261.0 lb

## 2023-08-03 DIAGNOSIS — R0609 Other forms of dyspnea: Secondary | ICD-10-CM

## 2023-08-03 DIAGNOSIS — R7989 Other specified abnormal findings of blood chemistry: Secondary | ICD-10-CM | POA: Diagnosis not present

## 2023-08-03 DIAGNOSIS — G6289 Other specified polyneuropathies: Secondary | ICD-10-CM | POA: Diagnosis not present

## 2023-08-03 DIAGNOSIS — G4733 Obstructive sleep apnea (adult) (pediatric): Secondary | ICD-10-CM

## 2023-08-03 DIAGNOSIS — R69 Illness, unspecified: Secondary | ICD-10-CM

## 2023-08-03 DIAGNOSIS — Z6841 Body Mass Index (BMI) 40.0 and over, adult: Secondary | ICD-10-CM | POA: Diagnosis not present

## 2023-08-03 DIAGNOSIS — E66813 Obesity, class 3: Secondary | ICD-10-CM | POA: Diagnosis not present

## 2023-08-03 DIAGNOSIS — I509 Heart failure, unspecified: Secondary | ICD-10-CM

## 2023-08-03 DIAGNOSIS — R7303 Prediabetes: Secondary | ICD-10-CM

## 2023-08-03 LAB — HEMOGLOBIN A1C
Est. average glucose Bld gHb Est-mCnc: 105 mg/dL
Hgb A1c MFr Bld: 5.3 % (ref 4.8–5.6)

## 2023-08-03 MED ORDER — TRAMADOL HCL 50 MG PO TABS: 50.0000 mg | ORAL_TABLET | Freq: Four times a day (QID) | ORAL | 2 refills | Status: DC | PRN

## 2023-08-03 MED ORDER — TORSEMIDE 10 MG PO TABS: 10.0000 mg | ORAL_TABLET | Freq: Every day | ORAL | 3 refills | Status: DC

## 2023-08-03 MED ORDER — POTASSIUM CHLORIDE CRYS ER 20 MEQ PO TBCR
20.0000 meq | EXTENDED_RELEASE_TABLET | ORAL | 3 refills | Status: AC | PRN
Start: 2023-08-03 — End: ?

## 2023-08-03 NOTE — Telephone Encounter (Signed)
*  STAT* If patient is at the pharmacy, call can be transferred to refill team.   1. Which medications need to be refilled? (please list name of each medication and dose if known)  potassium chloride SA (KLOR-CON M) 20 MEQ tablet   torsemide (DEMADEX) 10 MG tablet   2. Which pharmacy/location (including street and city if local pharmacy) is medication to be sent to? [facility] - [address] [place] - [address] Phone: [phone]  Fax: [phone]     3. Do they need a 30 day or 90 day supply? 90
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
## 2023-08-03 NOTE — Patient Instructions (Addendum)
 Torsemide:  MWF- 20mg  Tues, Thur, Sat/Sun: 10mg  in am   Gabapentin:  Take 1 capsule in Am and 2 capsules at night for 2 weeks. THEN Take 1 capsule(1 capsule) in the morning and 1 capsule at night.

## 2023-08-03 NOTE — Telephone Encounter (Signed)
 Pt's medications were sent to pt's pharmacy as requested. Confirmation received.

## 2023-08-04 ENCOUNTER — Other Ambulatory Visit (HOSPITAL_BASED_OUTPATIENT_CLINIC_OR_DEPARTMENT_OTHER): Payer: Self-pay | Admitting: Family Medicine

## 2023-08-04 ENCOUNTER — Telehealth: Payer: Self-pay

## 2023-08-04 DIAGNOSIS — R7989 Other specified abnormal findings of blood chemistry: Secondary | ICD-10-CM

## 2023-08-04 NOTE — Progress Notes (Signed)
 Complex Care Management Care Guide Note  08/04/2023 Name: BRISSIA DELISA MRN: [id number] DOB: [birth year]  CLORIA CIRESI is a 68 y.o. year old female who is a primary care patient of Paulla Bossier, [place]a Kocher, FNP and is actively engaged with the care management team.   Patient called overwhelmed about medications as well as follow up visits. Advised patient of upcoming appointments with [facility] RNCM and [facility] RPH and advised they would be best to assist. Also, reminded patient to write down any additional questions she had regarding additional resources needed.   [facility]  [facility] Care Management Assistant Direct Dial: [phone]  Fax: [phone]

## 2023-08-08 ENCOUNTER — Ambulatory Visit: Payer: Medicare HMO | Admitting: Internal Medicine

## 2023-08-08 ENCOUNTER — Telehealth (HOSPITAL_BASED_OUTPATIENT_CLINIC_OR_DEPARTMENT_OTHER): Payer: Self-pay | Admitting: Licensed Clinical Social Worker

## 2023-08-08 ENCOUNTER — Encounter: Payer: Self-pay | Admitting: Internal Medicine

## 2023-08-08 VITALS — BP 130/78 | HR 72 | Ht 65.0 in | Wt 257.0 lb

## 2023-08-08 DIAGNOSIS — J309 Allergic rhinitis, unspecified: Secondary | ICD-10-CM

## 2023-08-08 DIAGNOSIS — G4733 Obstructive sleep apnea (adult) (pediatric): Secondary | ICD-10-CM | POA: Diagnosis not present

## 2023-08-08 DIAGNOSIS — R053 Chronic cough: Secondary | ICD-10-CM

## 2023-08-08 DIAGNOSIS — K219 Gastro-esophageal reflux disease without esophagitis: Secondary | ICD-10-CM | POA: Diagnosis not present

## 2023-08-08 DIAGNOSIS — J4540 Moderate persistent asthma, uncomplicated: Secondary | ICD-10-CM

## 2023-08-08 MED ORDER — BUDESONIDE 0.25 MG/2ML IN SUSP: 0.2500 mg | Freq: Two times a day (BID) | RESPIRATORY_TRACT | 12 refills | Status: DC

## 2023-08-08 MED ORDER — AZELASTINE HCL 0.1 % NA SOLN
1.0000 | Freq: Two times a day (BID) | NASAL | 12 refills | Status: AC
Start: 2023-08-08 — End: ?

## 2023-08-08 NOTE — Patient Instructions (Addendum)
 It was a pleasure to see you today!  Please schedule follow up with myself in 3 months.  If my schedule is not open yet, we will contact you with a reminder closer to that time. Please call [phone] if you haven't heard from us  a month before, and always call us  sooner if issues or concerns arise. You can also send us  a message through MyChart, but but aware that this is not to be used for urgent issues and it may take up to 5-7 days to receive a reply. Please be aware that you will likely be able to view your results before I have a chance to respond to them. Please give us  5 business days to respond to any non-urgent results.    I suspect your chronic cough is related to a combination of irritation from reflux as well as postnasal drainage.  It may also be made worse by irritation from dry powder inhaler such as Trelegy and Advair.  For your asthma -stop Trelegy and Advair.  We will switch you to budesonide  nebulizer treatments.  This is your new maintenance medication once in the morning and once at night.  You can still use the albuterol  nebulizer treatments in between as needed.  For your allergies - continue Flonase  nasal spray daily.  Continue montelukast  daily.  Add Astelin  nasal spray daily.  You can take this with the Flonase .  For your reflux -this is certainly being made worse by things like caffeine and carbonated beverages.  You need to cut way back and potentially eliminate those beverages.  I have given you some additional lifestyle modification below.  Sleeping upright with your adjustable bed will help with the nighttime cough symptoms.  If you are having trouble swallowing I suspect you may need an esophageal dilation again in which case you will need to see a gastroenterologist.  For your sleep apnea - based on what you are describing I suspect your sleep apnea has returned.  You probably need to be back on a CPAP.  I am ordering a home sleep test.  We will arrange for this and  you can do it in your own home.  I can prescribe CPAP once I have those results.

## 2023-08-08 NOTE — Progress Notes (Signed)
 [name]    [id number]    04/03/1956  Primary Care Physician:Caudle, Arcola Kocher, FNP Date of Appointment: 08/08/2023 Established Patient Visit  Chief complaint:   Chief Complaint  Patient presents with   Follow-up    Troubled breathing since Jan.      HPI: [name] is a 68 y.o. woman with moderate persistent asthma, reflux, allergic rhinitis. History of gastric bypass.   Interval Updates: Here for follow up after increase to higher dose advair. No better. Coughing is persistent. Her primary care switched her to trelegy inhaler.  This did not help either.  She coughs all day, dry nagging cough. She is also coughing worse at night.   I also started her on pantoprazole  twice daily instead of once a day - no improvement in her cough. She saw the GI PA at Dr. [name] Lev office.  Seasonal allergies not doing well. Taking singulair  once daily. Taking flonase  nasal spray some days.   She is also having a hard time swallowing. Having worsening dryness in her throat the more she talks.   She is also having symptoms of sleep apnea. She wakes up at night gasping for air.   I have reviewed the patient's family social and past medical history and updated as appropriate.   Past Medical History:  Diagnosis Date   Allergy 2010   Anemia    takes Ferrous Sulfate  daily   Anxiety    takes Citalopram  daily   Arthritis    Asthma    2004-prior to gastric bypass and no problems since   CAP (community acquired pneumonia) 07/23/2021   CHF (congestive heart failure) (HCC)    takes Lasix  daily as needed   Chronic back pain    spondylolisthesis/stenosis/radiculopathy   CKD (chronic kidney disease) stage 2, GFR 60-89 ml/min    Complication of anesthesia yrs ago   slow to wake up   Depression    Diabetes (HCC)    DVT (deep venous thrombosis) (HCC) 01/17/2013   past hx. -tx.5-6 yrs ago bilateral legs, occ. sporadic swelling, has IVC filter implanted    Dysrhythmia    "heart tends to flutter"   Fibromyalgia    Fracture    right foot and is in a cam boot   Gallstones    GERD (gastroesophageal reflux disease)    hx of-no meds now   HCAP (healthcare-associated pneumonia) 02/19/2023   Heart murmur    History of bronchitis 2012 or 2013   History of colon polyps    Hypertension    takes Metoprolol  daily   Insomnia    takes Melatonin daily   Microalbuminuria    Pelvic floor dysfunction    Peripheral neuropathy    Pneumonia 90's   hx of   Recurrent Clostridioides difficile infection 02/28/2020   S/P gastric bypass 2003   Sleep apnea    no cpap used in many yrs after weight lost-no machine now   Urinary frequency    Urinary urgency    Vitamin D  deficiency     Past Surgical History:  Procedure Laterality Date   BALLOON DILATION N/A 01/31/2013   Procedure: BALLOON DILATION;  Surgeon: Evangeline Hilts, MD;  Location: [facility];  Service: Endoscopy;  Laterality: N/A;   CARDIAC EVENT MONITOR  03/2019   Predominantly sinus rhythm.  Rates range from 48-124 bpm.  Average 74 bpm.  Frequent short bursts (3-15 beats) PAT/PSVT--not indicated as being symptomatic on diary.  Otherwise rare PACs  and PVCs.   CARDIAC EVENT MONITOR  08/2021   14-Day Zio Patch: Predominantly sinus rhythm with rate range 45 to 135 bpm.  Average 73 bpm.  Occasional (1.3%) PACs with rare PVCs and rare PAC couplets.  24 episodes of atrial runs: Fastest was 11 beats at a rate of 184 bpm, longest was 13 beats with a rate of 92 bpm.  No sustained tachyarrhythmias or bradycardia arrhythmias.  No atrial fibrillation or flutter.   CARDIOPULMONARY EXERCISE STRESS TEST (CPX)  10/27/2021   Good effort despite dyspnea and wheezing.  Modified Naughton protocol on treadmill.  Normal heart rate response.  No sustained arrhythmias.  Preexercise spirometry normal.  Low normal peak VO2. Low normal functional capacity with NO Clear Cardiopulmonary Limitation.  Overall limitation primarily  due to obesity & deconditioning, along with a component of Diastolic Dysfunction   CHOLECYSTECTOMY  2007   COLONOSCOPY     CT CTA CORONARY W/CA SCORE W/CM &/OR WO/CM  09/21/2019   Coronary Calcium  Score 0.  Normal RCA dominant coronary anatomy.  CAD RADS 1-minimal nonobstructive CAD (0-24%).  Consider nonatherosclerotic cause for chest pain.  Consider preventative therapy and risk factor modification.   DILATION AND CURETTAGE OF UTERUS  yrs ago   ESOPHAGOGASTRODUODENOSCOPY (EGD) WITH PROPOFOL   03/01/2012   Procedure: ESOPHAGOGASTRODUODENOSCOPY (EGD) WITH PROPOFOL ;  Surgeon: Evangeline Hilts, MD;  [facility]: [facility];  Service: Endoscopy;  Laterality: N/A;   ESOPHAGOGASTRODUODENOSCOPY (EGD) WITH PROPOFOL  N/A 01/31/2013   Procedure: ESOPHAGOGASTRODUODENOSCOPY (EGD) WITH PROPOFOL ;  Surgeon: Evangeline Hilts, MD;  Location[facility];  Service: Endoscopy;  Laterality: N/A;   ESOPHAGOGASTRODUODENOSCOPY (EGD) WITH PROPOFOL  N/A 09/02/2015   Procedure: ESOPHAGOGASTRODUODENOSCOPY (EGD) WITH PROPOFOL ;  Surgeon: Kenney Peacemaker, MD;  Location[facility];  Service: Endoscopy;  Laterality: N/A;   FRACTURE SURGERY     GASTRIC BY-PASS  2004   HERNIA REPAIR  2005   INSERTION OF VENA CAVA FILTER  01/17/2013   inserted 2004- "abdomen"   LIGAMENT REPAIR Right 1987   Rt. knee scope   MAXIMUM ACCESS (MAS)POSTERIOR LUMBAR INTERBODY FUSION (PLIF) 2 LEVEL N/A 06/07/2014   Procedure: L4-5 L5-S1 FOR MAXIMUM ACCESS (MAS) POSTERIOR LUMBAR INTERBODY FUSION ;  Surgeon: Manya Sells, MD;  [facility];  Service: Neurosurgery;  Laterality: N/A;  L4-5 L5-S1 FOR MAXIMUM ACCESS (MAS) POSTERIOR LUMBAR INTERBODY FUSION    SPINE SURGERY  2017   TONSILLECTOMY     as child   TRANSTHORACIC ECHOCARDIOGRAM  12/2020   EF 55 to 60%.  No RWMA.  Mild LVH.  GR 1 DD-moderate LA dilation..  Mild MR..  Normal RV size/function.  Normal PAP.  Normal RAP. => Essentially stable from December 2020    Family History  Problem  Relation Age of Onset   Diabetes Mother    Atrial fibrillation Mother    Hypertension Mother    Arthritis Mother    Heart disease Father        Unsure of details   Hypertension Father    Prostate cancer Father    Alzheimer's disease Father    Arthritis Father    Cancer Father    Kidney disease Brother    Cancer Brother    Healthy Sister    Diabetes Maternal Grandmother    Heart disease Maternal Grandmother    Vision loss Maternal Grandmother    Stroke Maternal Grandfather 50   Hypertension Maternal Grandfather    Hypertension Paternal Grandmother    Alzheimer's disease Paternal Grandmother    Colon cancer Neg Hx  Colon polyps Neg Hx    Stomach cancer Neg Hx    Esophageal cancer Neg Hx    Pancreatic cancer Neg Hx    Liver disease Neg Hx     Social History   Occupational History   Occupation: retired  Tobacco Use   Smoking status: Former    Current packs/day: 0.00    Average packs/day: 0.5 packs/day for 10.0 years (5.0 ttl pk-yrs)    Types: Cigarettes    Start date: 04/20/1999    Quit date: 04/19/2009    Years since quitting: 14.3   Smokeless tobacco: Never  Vaping Use   Vaping status: Never Used  Substance and Sexual Activity   Alcohol  use: Not Currently    Comment: occ. social- wine-1 drink monthly   Drug use: No   Sexual activity: Not Currently     Physical Exam: Blood pressure 130/78, pulse 72, height 5\' 5"  (1.651 m), weight 257 lb (116.6 kg), SpO2 96%.  Gen:      No acute distress, frequent dry cough ENT:  +cobblestoning, no nasal polyps, mucus membranes moist Lungs:    No increased respiratory effort, symmetric chest wall excursion, clear to auscultation bilaterally, no wheezes or crackles CV:         Regular rate and rhythm; no murmurs, rubs, or gallops.  No pedal edema Abd: Obese, soft  Data Reviewed: Imaging: I have personally reviewed the chest xray March 2025 - mild pulmonary vascular congestion left lung linear atelectasis.  PFTs:      Latest Ref Rng & Units 12/23/2020    3:01 PM  PFT Results  FVC-Pre L 1.88   FVC-Predicted Pre % 80   FVC-Post L 1.94   FVC-Predicted Post % 82   Pre FEV1/FVC % % 78   Post FEV1/FCV % % 76   FEV1-Pre L 1.46   FEV1-Predicted Pre % 80   FEV1-Post L 1.47   DLCO uncorrected ml/min/mmHg 14.40   DLCO UNC% % 75   DLCO corrected ml/min/mmHg 14.32   DLCO COR %Predicted % 75   DLVA Predicted % 100   TLC L 4.47   TLC % Predicted % 91   RV % Predicted % 113    I have personally reviewed the patient's PFTs and no airflow limitation  Labs: Lab Results  Component Value Date   NA 142 06/28/2023   K 3.7 06/28/2023   CO2 23 06/28/2023   GLUCOSE 132 (H) 06/28/2023   BUN 20 06/28/2023   CREATININE 0.92 06/28/2023   CALCIUM  8.5 (L) 06/28/2023   GFR 63.00 08/17/2022   EGFR 71 05/18/2023   GFRNONAA >60 06/28/2023   Lab Results  Component Value Date   WBC 6.7 06/28/2023   HGB 11.6 (L) 06/28/2023   HCT 36.9 06/28/2023   MCV 85.6 06/28/2023   PLT 269 06/28/2023     Immunization status: Immunization History  Administered Date(s) Administered   Fluad Quad(high Dose 65+) 12/26/2020   Influenza Split 02/01/2007, 12/16/2017   Influenza Whole 02/01/2007   Influenza, High Dose Seasonal PF 01/11/2023   Influenza,inj,Quad PF,6+ Mos 12/16/2017   Influenza-Unspecified 02/19/2013, 05/20/2014, 02/01/2016, 12/17/2016, 12/21/2018, 02/19/2020, 01/06/2022   Moderna Covid-19 Vaccine Bivalent Booster 66yrs & up 01/06/2022   Moderna Sars-Covid-2 Vaccination 05/13/2019, 06/10/2019, 02/12/2020, 08/12/2020, 12/26/2020   PNEUMOCOCCAL CONJUGATE-20 03/05/2021   Pneumococcal Conjugate-13 01/02/2019   Respiratory Syncytial Virus Vaccine,Recomb Aduvanted(Arexvy) 03/08/2022   Td 11/12/1997   Td (Adult),5 Lf Tetanus Toxid, Preservative Free 11/12/1997   Tdap 11/12/1997, 08/07/2014   Zoster Recombinant(Shingrix)  10/04/2019, 03/08/2022   Zoster, Unspecified 10/11/2019    External Records Personally Reviewed:  pcp  Assessment:  Chronic cough Moderate persistent asthma, not well controlled History of passive smoke exposure GERD, not well controlled  Plan/Recommendations: I suspect your chronic cough is related to a combination of irritation from reflux as well as postnasal drainage.  It may also be made worse by irritation from dry powder inhaler such as Trelegy and Advair.  For your asthma -stop Trelegy and Advair.  We will switch you to budesonide  nebulizer treatments.  This is your new maintenance medication once in the morning and once at night.  You can still use the albuterol  nebulizer treatments in between as needed.  For your allergies - continue Flonase  nasal spray daily.  Continue montelukast  daily.  Add Astelin  nasal spray daily.  You can take this with the Flonase .  For your reflux -this is certainly being made worse by things like caffeine and carbonated beverages.  You need to cut way back and potentially eliminate those beverages.  I have given you some additional lifestyle modification below.  Sleeping upright with your adjustable bed will help with the nighttime cough symptoms.  If you are having trouble swallowing I suspect you may need an esophageal dilation again in which case you will need to see a gastroenterologist.   Return to Care: Return in about 3 months (around 11/07/2023).   Louie Rover, MD Pulmonary and Critical Care Medicine [facility] Office:[phone]

## 2023-08-08 NOTE — Telephone Encounter (Signed)
 H&V Care Navigation CSW Progress Note  Clinical Social Worker contacted patient by phone to f/u on [employer] application. Did not have anything left in my box and wanted to make sure pt had guidance to complete/submit as needed. Also wanted to make sure pt had been able to get in touch with [facility] team for Extra Help/Low income [place] plan. No answer today at [phone]. Left voicemail. Will re-attempt again as able.  Patient is participating in a Managed Medicaid Plan:  No, [employer] Medicare  SDOH Screenings   Food Insecurity: Food Insecurity Present (07/28/2023)  Housing: Low Risk  (07/28/2023)  Transportation Needs: Unmet Transportation Needs (07/28/2023)  Utilities: At Risk (07/28/2023)  Alcohol  Screen: Low Risk  (02/14/2023)  Depression (PHQ2-9): Low Risk  (08/03/2023)  Recent Concern: Depression (PHQ2-9) - Medium Risk (05/17/2023)  Financial Resource Strain: High Risk (07/28/2023)  Physical Activity: Inactive (04/01/2023)  Social Connections: Moderately Integrated (04/01/2023)  Stress: Stress Concern Present (07/28/2023)  Tobacco Use: Medium Risk (08/03/2023)  Health Literacy: Adequate Health Literacy (07/28/2023)    Nathen Balder, MSW, LCSW Clinical Social Worker II [facility] Heart/Vascular Care Navigation  [phone]- work cell phone (preferred) [phone]- desk phone

## 2023-08-09 ENCOUNTER — Telehealth: Payer: Self-pay

## 2023-08-09 ENCOUNTER — Telehealth (HOSPITAL_BASED_OUTPATIENT_CLINIC_OR_DEPARTMENT_OTHER): Payer: Self-pay | Admitting: Family Medicine

## 2023-08-09 MED ORDER — BENZONATATE 100 MG PO CAPS: 100.0000 mg | ORAL_CAPSULE | Freq: Three times a day (TID) | ORAL | 3 refills | Status: AC | PRN

## 2023-08-09 NOTE — Telephone Encounter (Signed)
 Not on list   Copied from CRM [id number]. Topic: Clinical - Medication Refill >> Aug 09, 2023 10:07 AM Baldemar Lev wrote: Most Recent Primary Care Visit:  Provider: Nonda Bays  Department: [facility]  Visit Type: FOLLOW UP  Date: 08/03/2023  Medication: benzonatate  (TESSALON ) capsule 100 mg    Has the patient contacted their pharmacy? Yes (Agent: If no, request that the patient contact the pharmacy for the refill. If patient does not wish to contact the pharmacy document the reason why and proceed with request.) (Agent: If yes, when and what did the pharmacy advise?)  Is this the correct pharmacy for this prescription? Yes If no, delete pharmacy and type the correct one.  This is the patient's preferred pharmacy:   [facility], [place] - [address] [place] [address] Phone: [phone] Fax: [phone]   Has the prescription been filled recently? No  Is the patient out of the medication? Does not know   Has the patient been seen for an appointment in the last year OR does the patient have an upcoming appointment? Yes  Can we respond through MyChart? Yes  Agent: Please be advised that Rx refills may take up to 3 business days. We ask that you follow-up with your pharmacy.

## 2023-08-09 NOTE — Telephone Encounter (Signed)
 Attempted to call patient- no answer- left message to call office regarding this Rx request. Last filled 06/29/23 by outside provider.

## 2023-08-09 NOTE — Telephone Encounter (Signed)
 Alexis, please advise if you are okay with us  refilling pt's benzonatate .

## 2023-08-09 NOTE — Telephone Encounter (Signed)
 Refill of pt's benzonatate  has been sent to preferred pharmacy for pt. Attempted to call pt to let her know this had been done but unable to reach. Left a detailed message letting her know that the Rx had been sent.

## 2023-08-10 DIAGNOSIS — E1142 Type 2 diabetes mellitus with diabetic polyneuropathy: Secondary | ICD-10-CM | POA: Diagnosis not present

## 2023-08-10 DIAGNOSIS — D631 Anemia in chronic kidney disease: Secondary | ICD-10-CM | POA: Diagnosis not present

## 2023-08-10 DIAGNOSIS — I509 Heart failure, unspecified: Secondary | ICD-10-CM | POA: Diagnosis not present

## 2023-08-10 DIAGNOSIS — E1122 Type 2 diabetes mellitus with diabetic chronic kidney disease: Secondary | ICD-10-CM | POA: Diagnosis not present

## 2023-08-10 DIAGNOSIS — N182 Chronic kidney disease, stage 2 (mild): Secondary | ICD-10-CM | POA: Diagnosis not present

## 2023-08-10 DIAGNOSIS — I130 Hypertensive heart and chronic kidney disease with heart failure and stage 1 through stage 4 chronic kidney disease, or unspecified chronic kidney disease: Secondary | ICD-10-CM | POA: Diagnosis not present

## 2023-08-10 DIAGNOSIS — M5010 Cervical disc disorder with radiculopathy, unspecified cervical region: Secondary | ICD-10-CM | POA: Diagnosis not present

## 2023-08-10 DIAGNOSIS — M5416 Radiculopathy, lumbar region: Secondary | ICD-10-CM | POA: Diagnosis not present

## 2023-08-10 DIAGNOSIS — K8681 Exocrine pancreatic insufficiency: Secondary | ICD-10-CM | POA: Diagnosis not present

## 2023-08-11 ENCOUNTER — Telehealth: Payer: Self-pay | Admitting: Licensed Clinical Social Worker

## 2023-08-11 ENCOUNTER — Telehealth (HOSPITAL_BASED_OUTPATIENT_CLINIC_OR_DEPARTMENT_OTHER): Payer: Self-pay | Admitting: *Deleted

## 2023-08-11 ENCOUNTER — Other Ambulatory Visit: Payer: Self-pay | Admitting: Pharmacist

## 2023-08-11 DIAGNOSIS — J4489 Other specified chronic obstructive pulmonary disease: Secondary | ICD-10-CM

## 2023-08-11 DIAGNOSIS — R7989 Other specified abnormal findings of blood chemistry: Secondary | ICD-10-CM | POA: Diagnosis not present

## 2023-08-11 NOTE — Telephone Encounter (Signed)
 H&V Care Navigation CSW Progress Note  Clinical Social Worker contacted patient by phone to f/u on [employer] application and on recommendation to call [facility]. No answer again today at [phone]. Left 2nd voicemail. Will re-attempt again as able.  Patient is participating in a Managed Medicaid Plan:  No, [employer] Medicare  SDOH Screenings   Food Insecurity: Food Insecurity Present (07/28/2023)  Housing: Low Risk  (07/28/2023)  Transportation Needs: Unmet Transportation Needs (07/28/2023)  Utilities: At Risk (07/28/2023)  Alcohol  Screen: Low Risk  (02/14/2023)  Depression (PHQ2-9): Low Risk  (08/03/2023)  Recent Concern: Depression (PHQ2-9) - Medium Risk (05/17/2023)  Financial Resource Strain: High Risk (07/28/2023)  Physical Activity: Inactive (04/01/2023)  Social Connections: Moderately Integrated (04/01/2023)  Stress: Stress Concern Present (07/28/2023)  Tobacco Use: Medium Risk (08/08/2023)  Health Literacy: Adequate Health Literacy (07/28/2023)    [name], MSW, LCSW Clinical Social Worker II [facility] Heart/Vascular Care Navigation  [phone]- work cell phone (preferred) [phone]- desk phone

## 2023-08-11 NOTE — Telephone Encounter (Signed)
 H&V Care Navigation CSW Progress Note  Clinical Social Worker  received return call  from pt, shares she left her phone at a friends and just got it back- aware of other missed calls. She will reach out to those providers. Applied for Extra Help with assistance of [facility] team and hasn't yet received determination- we discussed how to call Medicare on Monday if still no letter. Pt also confirmed receiving [employer] application, will fill out and bring to appt in May at [facility] V[facility] center. Discussed new building. Encouraged her to call as needed. Will check in next week- had some concerns about affordability for new meds from pulmonologist until Extra Help determination- discussed calling the office regarding any samples or assistance until that is processed.  Patient is participating in a Managed Medicaid Plan:  No, [employer] Medicare  SDOH Screenings   Food Insecurity: Food Insecurity Present (07/28/2023)  Housing: Low Risk  (07/28/2023)  Transportation Needs: Unmet Transportation Needs (07/28/2023)  Utilities: At Risk (07/28/2023)  Alcohol  Screen: Low Risk  (02/14/2023)  Depression (PHQ2-9): Low Risk  (08/03/2023)  Recent Concern: Depression (PHQ2-9) - Medium Risk (05/17/2023)  Financial Resource Strain: High Risk (07/28/2023)  Physical Activity: Inactive (04/01/2023)  Social Connections: Moderately Integrated (04/01/2023)  Stress: Stress Concern Present (07/28/2023)  Tobacco Use: Medium Risk (08/08/2023)  Health Literacy: Adequate Health Literacy (07/28/2023)    Nathen Balder, MSW, LCSW Clinical Social Worker II [facility] Heart/Vascular Care Navigation  [phone]- work cell phone (preferred) [phone]- desk phone

## 2023-08-11 NOTE — Progress Notes (Signed)
   08/11/2023  Patient ID: [name], female   DOB: 1955/07/18, 68 y.o.   MRN: [id number]  Called and spoke with the patient on the phone for medication management referral. Already paid $250 drug deductible with Trelegy earlier in the year.  For Trelegy, patient is no longer taking. Was prescribed Budesonide  and Albuterol  as needed. Both are filled for 7 day-supply. Albuterol = $2.44 anBudesonide = $31.18. Will send Rx to [facility] to see if they can be filled here instead.   Estradiol  cream was $412 for brand, but filled with generic for $27.   The Creon  (has been getting samples) will be $47 monthly. Can apply to get for free with [employer], but waiting for ExtraHelp application approval/rejection notification first.   Allergies are doing better since using Azelastine  and wearing a mask outside.    Delvin File, PharmD [facility]  Phone Number: [phone]

## 2023-08-11 NOTE — Telephone Encounter (Signed)
 Copied from CRM [id number]. Topic: General - Other >> Aug 11, 2023  1:00 PM Santiya F wrote: Reason for CRM: Jenna with [facility] is calling because the patient was opened with [facility] but requested [facility] and she is requesting female nurses and only nurses she's seen at [facility] and services will be starting 08/12/23 due to them having issues contacting patient.

## 2023-08-11 NOTE — Telephone Encounter (Signed)
 FYI

## 2023-08-12 ENCOUNTER — Telehealth: Payer: Self-pay | Admitting: Cardiology

## 2023-08-12 DIAGNOSIS — F419 Anxiety disorder, unspecified: Secondary | ICD-10-CM | POA: Diagnosis not present

## 2023-08-12 DIAGNOSIS — E1142 Type 2 diabetes mellitus with diabetic polyneuropathy: Secondary | ICD-10-CM | POA: Diagnosis not present

## 2023-08-12 DIAGNOSIS — D631 Anemia in chronic kidney disease: Secondary | ICD-10-CM | POA: Diagnosis not present

## 2023-08-12 DIAGNOSIS — F331 Major depressive disorder, recurrent, moderate: Secondary | ICD-10-CM | POA: Diagnosis not present

## 2023-08-12 DIAGNOSIS — I951 Orthostatic hypotension: Secondary | ICD-10-CM | POA: Diagnosis not present

## 2023-08-12 DIAGNOSIS — N182 Chronic kidney disease, stage 2 (mild): Secondary | ICD-10-CM | POA: Diagnosis not present

## 2023-08-12 DIAGNOSIS — I130 Hypertensive heart and chronic kidney disease with heart failure and stage 1 through stage 4 chronic kidney disease, or unspecified chronic kidney disease: Secondary | ICD-10-CM | POA: Diagnosis not present

## 2023-08-12 DIAGNOSIS — I509 Heart failure, unspecified: Secondary | ICD-10-CM | POA: Diagnosis not present

## 2023-08-12 DIAGNOSIS — E1122 Type 2 diabetes mellitus with diabetic chronic kidney disease: Secondary | ICD-10-CM | POA: Diagnosis not present

## 2023-08-12 LAB — HEPATIC FUNCTION PANEL
ALT: 31 IU/L (ref 0–32)
AST: 33 IU/L (ref 0–40)
Albumin: 4.3 g/dL (ref 3.9–4.9)
Alkaline Phosphatase: 101 IU/L (ref 44–121)
Bilirubin Total: 0.3 mg/dL (ref 0.0–1.2)
Bilirubin, Direct: 0.11 mg/dL (ref 0.00–0.40)
Total Protein: 6.6 g/dL (ref 6.0–8.5)

## 2023-08-12 NOTE — Telephone Encounter (Signed)
  Pt c/o medication issue:  1. Name of Medication:   torsemide  (DEMADEX ) 10 MG tablet    2. How are you currently taking this medication (dosage and times per day)? Take 1 tablet (10 mg total) by mouth daily. Take an additional 10 mg once a day for Lower extremity swelling, SOB, weight gain of 3 lbs overnight and 5 lbs over a weeks.   3. Are you having a reaction (difficulty breathing--STAT)? No   4. What is your medication issue? [name] from [facility] called to request clarification regarding the patient's medication. The patient informed them that she is also taking 20 mg of torsemide ; however, they have not received a prescription for the 20 mg dosage.

## 2023-08-12 NOTE — Telephone Encounter (Signed)
 Patient identification verified by 2 forms. Sims Duck, RN     Called and spoke with pharmacy team at [facility]. Verified that patient is to take 10 mg torsemide  daily but may take an additional 10 mg as needed for swelling or weight gain of 3 lbs overnight or 5 lbs in a week.   Called and reviewed medication instructions with patient as well.  Patient verbalized understanding. No further questions at this time.

## 2023-08-15 ENCOUNTER — Telehealth: Payer: Self-pay | Admitting: *Deleted

## 2023-08-15 ENCOUNTER — Telehealth: Payer: Self-pay

## 2023-08-15 DIAGNOSIS — I130 Hypertensive heart and chronic kidney disease with heart failure and stage 1 through stage 4 chronic kidney disease, or unspecified chronic kidney disease: Secondary | ICD-10-CM | POA: Diagnosis not present

## 2023-08-15 DIAGNOSIS — E1122 Type 2 diabetes mellitus with diabetic chronic kidney disease: Secondary | ICD-10-CM | POA: Diagnosis not present

## 2023-08-15 DIAGNOSIS — F331 Major depressive disorder, recurrent, moderate: Secondary | ICD-10-CM | POA: Diagnosis not present

## 2023-08-15 DIAGNOSIS — I509 Heart failure, unspecified: Secondary | ICD-10-CM | POA: Diagnosis not present

## 2023-08-15 DIAGNOSIS — I951 Orthostatic hypotension: Secondary | ICD-10-CM | POA: Diagnosis not present

## 2023-08-15 DIAGNOSIS — D631 Anemia in chronic kidney disease: Secondary | ICD-10-CM | POA: Diagnosis not present

## 2023-08-15 DIAGNOSIS — Z599 Problem related to housing and economic circumstances, unspecified: Secondary | ICD-10-CM

## 2023-08-15 DIAGNOSIS — E1142 Type 2 diabetes mellitus with diabetic polyneuropathy: Secondary | ICD-10-CM | POA: Diagnosis not present

## 2023-08-15 DIAGNOSIS — N182 Chronic kidney disease, stage 2 (mild): Secondary | ICD-10-CM | POA: Diagnosis not present

## 2023-08-15 DIAGNOSIS — F419 Anxiety disorder, unspecified: Secondary | ICD-10-CM | POA: Diagnosis not present

## 2023-08-15 NOTE — Progress Notes (Signed)
 Complex Care Management Note  Care Guide Note 08/15/2023 Name: ARNELL PORATH MRN: [id number] DOB: 1956-03-07  JONIKKA LASCOLA is a 68 y.o. year old female who sees Caudle, Arcola Kocher, FNP for primary care. I reached out to Alexia Idler by phone today to offer complex care management services.  Ms. Weidner was given information about Complex Care Management services today including:   The Complex Care Management services include support from the care team which includes your Nurse Care Manager, Clinical Social Worker, or Pharmacist.  The Complex Care Management team is here to help remove barriers to the health concerns and goals most important to you. Complex Care Management services are voluntary, and the patient may decline or stop services at any time by request to their care team member.   Complex Care Management Consent Status: Patient agreed to services and verbal consent obtained.   Follow up plan:  Telephone appointment with complex care management team member scheduled for:  4/29  Encounter Outcome:  Patient Scheduled  Barnie Bora  [facility]  [facility] Guide  Direct Dial: [phone]  Fax [phone]

## 2023-08-15 NOTE — Progress Notes (Signed)
   Telephone encounter was:  Successful.  Complex Care Management Note Care Guide Note  08/15/2023 Name: [name] MRN: [id number] DOB: Dec 10, 1955  [name] is a 68 y.o. year old female who is a primary care patient of Paulla Bossier, [place] Kocher, FNP . The community resource team was consulted for assistance with Financial Difficulties related to Financial strain  SDOH screenings and interventions completed:  Yes  Social Drivers of Health From This Encounter   Financial Resource Strain: High Risk (08/15/2023)   Overall Financial Resource Strain (CARDIA)    Difficulty of Paying Living Expenses: Very hard  Utilities: At Risk (08/15/2023)   Utilities    Threatened with loss of utilities: Yes    SDOH Interventions Today    Flowsheet Row Most Recent Value  SDOH Interventions   Utilities Interventions Community Resources Provided  Financial Strain Interventions Community Resources Provided        Care guide performed the following interventions: Patient provided with information about care guide support team and interviewed to confirm resource needs.Pt is having financial strain and power will be shut off today. I gave resources over the phone and emailed resources for [facility]. I have added a refferal for [employer] for food. PT has already applied for [employer]  Follow Up Plan:  No further follow up planned at this time. The patient has been provided with needed resources.  Encounter Outcome:  Patient Visit Completed    Azell Leopard [facility]  [facility] Guide, Phone: [phone] Fax: [phone] Website: [email or web address]

## 2023-08-15 NOTE — Progress Notes (Unsigned)
 Please send albuterol  nebs and budesonide  nebs through her DME with 1 year refills

## 2023-08-16 ENCOUNTER — Telehealth: Payer: Self-pay

## 2023-08-16 ENCOUNTER — Other Ambulatory Visit: Payer: Self-pay | Admitting: Licensed Clinical Social Worker

## 2023-08-16 ENCOUNTER — Telehealth: Payer: Self-pay | Admitting: Licensed Clinical Social Worker

## 2023-08-16 NOTE — Progress Notes (Signed)
 Complex Care Management Note  Care Guide Note 08/16/2023 Name: [name] MRN: [id number] DOB: 02-17-1956  [name] is a 68 y.o. year old female who sees Caudle, Arcola Kocher, FNP for primary care. I reached out to Alexia Idler by phone today to offer complex care management services.  [name] was given information about Complex Care Management services today including:   The Complex Care Management services include support from the care team which includes your Nurse Care Manager, Clinical Social Worker, or Pharmacist.  The Complex Care Management team is here to help remove barriers to the health concerns and goals most important to you. Complex Care Management services are voluntary, and the patient may decline or stop services at any time by request to their care team member.   Complex Care Management Consent Status: Patient agreed to services and verbal consent obtained.   Follow up plan:  Telephone appointment with complex care management team member scheduled for:  08/31/23 at 3:00 p.m.  Encounter Outcome:  Patient Scheduled  [facility]  [facility], [facility] Health Care Management Assistant Direct Dial: [phone]  Fax: [phone]

## 2023-08-16 NOTE — Patient Outreach (Signed)
 Complex Care Management   Visit Note  08/16/2023  Name:  [name] MRN: [id number] DOB: [birth year]  Situation: Referral received for Complex Care Management related to SDOH Barriers:  Financial Resource Strain I obtained verbal consent from Patient.  Visit completed with Thayer Finders  on the phone  Background:   Past Medical History:  Diagnosis Date   Allergy 2010   Anemia    takes Ferrous Sulfate  daily   Anxiety    takes Citalopram  daily   Arthritis    Asthma    2004-prior to gastric bypass and no problems since   CAP (community acquired pneumonia) 07/23/2021   CHF (congestive heart failure) (HCC)    takes Lasix  daily as needed   Chronic back pain    spondylolisthesis/stenosis/radiculopathy   CKD (chronic kidney disease) stage 2, GFR 60-89 ml/min    Complication of anesthesia yrs ago   slow to wake up   Depression    Diabetes (HCC)    DVT (deep venous thrombosis) (HCC) 01/17/2013   past hx. -tx.5-6 yrs ago bilateral legs, occ. sporadic swelling, has IVC filter implanted   Dysrhythmia    "heart tends to flutter"   Fibromyalgia    Fracture    right foot and is in a cam boot   Gallstones    GERD (gastroesophageal reflux disease)    hx of-no meds now   HCAP (healthcare-associated pneumonia) 02/19/2023   Heart murmur    History of bronchitis 2012 or 2013   History of colon polyps    Hypertension    takes Metoprolol  daily   Insomnia    takes Melatonin daily   Microalbuminuria    Pelvic floor dysfunction    Peripheral neuropathy    Pneumonia 90's   hx of   Recurrent Clostridioides difficile infection 02/28/2020   S/P gastric bypass 2003   Sleep apnea    no cpap used in many yrs after weight lost-no machine now   Urinary frequency    Urinary urgency    Vitamin D  deficiency     Assessment: Patient Reported Symptoms:  Cognitive Cognitive Status: Alert and oriented to person, place, and time, Able to follow simple commands Cognitive/Intellectual  Conditions Management [RPT]: Not Assessed      Neurological Neurological Review of Symptoms: No symptoms reported    HEENT        Cardiovascular      Respiratory      Endocrine Patient reports the following symptoms related to hypoglycemia or hyperglycemia : No symptoms reported Is patient diabetic?: Yes Is patient checking blood sugars at home?: Yes    Gastrointestinal Gastrointestinal Symptoms Reported: No symptoms reported      Genitourinary Genitourinary Symptoms Reported: No symptoms reported    Integumentary Integumentary Symptoms Reported: No symptoms reported    Musculoskeletal Musculoskelatal Symptoms Reviewed: Difficulty walking   Falls in the past year?: No Number of falls in past year: 1 or less Was there an injury with Fall?: No Fall Risk Category Calculator: 0 Patient Fall Risk Level: Low Fall Risk Patient at Risk for Falls Due to: No Fall Risks  Psychosocial Psychosocial Symptoms Reported: No symptoms reported     Do you feel physically threatened by others?: No      08/16/2023    3:12 PM  Depression screen PHQ 2/9  Decreased Interest 0  Down, Depressed, Hopeless 0  PHQ - 2 Score 0  Altered sleeping 1  Tired, decreased energy 1  Feeling bad or failure about yourself  0  Trouble concentrating 0  Moving slowly or fidgety/restless 0  Suicidal thoughts 0  PHQ-9 Score 2    There were no vitals filed for this visit.  Medications Reviewed Today     Reviewed by Fletcher Humble, LCSW (Social Worker) on 08/16/23 at 1505  Med List Status: <None>   Medication Order Taking? Sig Documenting Provider Last Dose Status Informant  albuterol  (PROVENTIL ) (2.5 MG/3ML) 0.083% nebulizer solution [id number] No Take 3 mLs (2.5 mg total) by nebulization every 6 (six) hours as needed for wheezing or shortness of breath. Nonda Bays, FNP Taking Active   albuterol  (VENTOLIN  HFA) 108 [phone] Base) MCG/ACT inhaler [id number] No INHALE 2 PUFFS INTO THE LUNGS EVERY 6  HOURS AS NEEDED FOR WHEEZING OR SHORTNESS OF BREATH Adelia Homestead, MD Taking Active Self, Pharmacy Records  amLODipine  (NORVASC ) 10 MG tablet [id number] No Take 1 tablet (10 mg total) by mouth daily. Nonda Bays, FNP Taking Active   Ascorbic Acid  (VITAMIN C ) 1000 MG tablet [id number] No Take 1,000 mg by mouth daily. [provider] Taking Active Self, Pharmacy Records  aspirin  EC 81 MG tablet [id number] No Take 1 tablet (81 mg total) by mouth daily. Swallow whole. Nonda Bays, FNP Taking Active   azelastine  (ASTELIN ) 0.1 % nasal spray [id number]  Place 1 spray into both nostrils 2 (two) times daily. Use in each nostril as directed Aleck Hurdle, MD  Active   benzonatate  (TESSALON ) 100 MG capsule [id number]  Take 1 capsule (100 mg total) by mouth every 8 (eight) hours as needed for cough. Nonda Bays, FNP  Active   budesonide  (PULMICORT ) 0.25 MG/2ML nebulizer solution [id number]  Take 2 mLs (0.25 mg total) by nebulization in the morning and at bedtime. Desai, Nikita S, MD  Active   buPROPion  (WELLBUTRIN  XL) 300 MG 24 hr tablet [id number] No TAKE 1 TABLET(300 MG) BY MOUTH DAILY Caudle, Arcola Kocher, FNP Taking Active   estradiol  (ESTRACE ) 0.1 MG/GM vaginal cream [id number] No Use a pea-sized amount on your index finger and apply to vaginal opening 2 nights a week Trent Frizzle, MD Taking Active            Med Note Burley Carpenter, LISA   Tue Jul 26, 2023 10:58 AM) Hasn't yet picked up from pharmacy, just prescribed yesterday  ferrous sulfate  325 (65 FE) MG tablet [id number] No Take 1 tablet (325 mg total) by mouth daily with breakfast. Macdonald Savoy, MD Taking Active   fluticasone  (FLONASE ) 50 MCG/ACT nasal spray [id number] No Place 2 sprays into both nostrils daily. Nonda Bays, FNP Taking Active   gabapentin  (NEURONTIN ) 100 MG capsule [id number] No Take 2 capsules (200 mg total) by mouth 2 (two) times daily. Izora Marten, PA-C Taking Active Self,  Pharmacy Records           Med Note Burley Carpenter, LISA   Tue Jul 26, 2023 11:02 AM) Patient taking 100 mg capsule one in am, one at bedtime  ipratropium (ATROVENT ) 0.02 % nebulizer solution [id number] No Take 2.5 mLs (0.5 mg total) by nebulization every 6 (six) hours as needed for wheezing or shortness of breath. Nonda Bays, FNP Taking Active   lipase/protease/amylase (CREON ) 36000 UNITS CPEP capsule [id number] No Take 36,000 Units by mouth 4 (four) times daily as needed (Stomach Pain). [provider] Taking Active Self, Pharmacy Records  loperamide  (IMODIUM ) 2 MG capsule [id number] No Take 1 capsule (2 mg total) by mouth as needed for diarrhea or loose stools. Meuth, Quillian Brunt,  PA-C Taking Active Self, Pharmacy Records  montelukast  (SINGULAIR ) 10 MG tablet [id number] No Take 1 tablet (10 mg total) by mouth at bedtime. Adelia Homestead, MD Taking Active Self, Pharmacy Records  Multiple Vitamin (MULTI-VITAMIN) tablet [id number] No Take 1 tablet by mouth daily. [provider] Taking Active Self, Pharmacy Records  ondansetron  (ZOFRAN ) 4 MG tablet [id number] No Take 1 tablet (4 mg total) by mouth every 8 (eight) hours as needed for nausea or vomiting. Rai, Hurman Maiden, MD Taking Active   pantoprazole  (PROTONIX ) 40 MG tablet [id number] No Take 1 tablet (40 mg total) by mouth 2 (two) times daily before a meal. Aleck Hurdle, MD Taking Active   PARoxetine  (PAXIL ) 20 MG tablet [id number] No Take 1 tablet (20 mg total) by mouth daily. Nonda Bays, FNP Taking Active   potassium chloride  SA (KLOR-CON  M) 20 MEQ tablet [id number] No Take 1 tablet (20 mEq total) by mouth as needed (Take on tablet once a day as needed witht he aditional dose of torsemide .). Arleen Lacer, MD Taking Active   torsemide  (DEMADEX ) 10 MG tablet [id number] No Take 1 tablet (10 mg total) by mouth daily. Take an additional 10 mg once a day for Lower extremity swelling, SOB, weight gain of 3 lbs overnight and 5  lbs over a weeks. Arleen Lacer, MD Taking Active   traMADol  (ULTRAM ) 50 MG tablet [id number] No Take 1 tablet (50 mg total) by mouth every 6 (six) hours as needed for severe pain (pain score 7-10). Nonda Bays, FNP Taking Active   traZODone  (DESYREL ) 50 MG tablet [id number] No Take 1 tablet (50 mg total) by mouth at bedtime as needed for sleep. Adelia Homestead, MD Taking Active Self, Pharmacy Records  VITAMIN D  PO [id number] No Take 1 capsule by mouth daily. [provider] Taking Active Self, Pharmacy Records            Recommendation:   Pt encourage to contact [facility] for they utility assistance program [phone], [employer] and [employer].   Follow Up Plan:   Telephone follow up appointment date/time:  08/30/2023 at 0930  Fletcher Humble MSW, LCSW Licensed Clinical Social Worker  [facility], Population Health Direct Dial: [phone]  Fax: [phone]

## 2023-08-16 NOTE — Patient Instructions (Signed)
 Visit Information  Thank you for taking time to visit with me today. Please don't hesitate to contact me if I can be of assistance to you before our next scheduled appointment.  Our next appointment is by telephone on 08/30/2023 at 11:00 am Please call the care guide team at [phone] if you need to cancel or reschedule your appointment.   Following is a copy of your care plan:   Goals Addressed             This Visit's Progress    [facility] BSW Social Work Care Plan       Problems:   Financial Strain   CSW Clinical Goal(s):   Over the next 1 weeks the Patient will will follow up with Utility resources and family/friends as directed by Social Work.  Interventions:  SW provided patient resources for utility resources assistance  Patient Goals/Self-Care Activities:  Coordinate with DSS and other resources  to assist with utility bill .  Plan:   Telephone follow up appointment with care management team member scheduled for:  08/29/2023 at 11:00 am        Please call the Suicide and Crisis Lifeline: 988 go to [facility] [address] [place] [phone]) call 911 if you are experiencing a Mental Health or Behavioral Health Crisis or need someone to talk to.  Patient verbalizes understanding of instructions and care plan provided today and agrees to view in MyChart. Active MyChart status and patient understanding of how to access instructions and care plan via MyChart confirmed with patient.     [name], PhD [facility], [facility] Social Worker Direct Dial: [phone]  Fax: [phone]

## 2023-08-16 NOTE — Telephone Encounter (Signed)
-----   Message from Nurse Joetta Mustache sent at 08/09/2023  1:19 PM EDT ----- Please attempt outreach to reschedule.  Thank you!   Clarnce Crow BSN RN CCM [place]  [facility], [facility] Health RN Care Manager Direct Dial: [phone] [phone]

## 2023-08-16 NOTE — Telephone Encounter (Signed)
 H&V Care Navigation CSW Progress Note  Clinical Social Worker contacted patient by phone to f/u on notes from [facility] team noting that she has a disconnection notice from [employer]. LCSW was able to reach her at [phone]. Pt confirms that she was previously on a payment plan with [employer] but after missed payments she is now responsible for the whole past due amount or they will disconnect. She is also behind on her water bill. She unfortunately has called [employer], DSS, [address] and [address] and no one has funds at this time. May have funds beginning May 1st but that is not certain. [employer] unfortunately will not delay disconnection despite her getting her check this week and being able to pay then. LCSW discussed case with team lead Fredrik Jensen and [place], [place] with [employer] who is very familiar with pt. We do not know of other resources at this time, pt does not qualify for assistance through Patient Care Fund presumptive eligibility at this time. Called pt back and made her aware. Encouraged her to f/u on 5/1 with programs she called today, even if they are only able to provide partial assistance so she does not have to use her whole check and run risk of getting behind on other bills. Will f/u if any additional resources are noted. Will make referral to [facility] for Housing as well to see if any other funds available.   Patient is participating in a Managed Medicaid Plan:  No, [employer] Medicare only  SDOH Screenings   Food Insecurity: Food Insecurity Present (08/16/2023)  Housing: Low Risk  (08/16/2023)  Transportation Needs: No Transportation Needs (08/16/2023)  Recent Concern: Transportation Needs - Unmet Transportation Needs (07/28/2023)  Utilities: At Risk (08/16/2023)  Alcohol  Screen: Low Risk  (02/14/2023)  Depression (PHQ2-9): Low Risk  (08/03/2023)  Recent Concern: Depression (PHQ2-9) - Medium Risk (05/17/2023)  Financial Resource Strain: High Risk (08/15/2023)  Physical  Activity: Inactive (04/01/2023)  Social Connections: Moderately Integrated (04/01/2023)  Stress: Stress Concern Present (07/28/2023)  Tobacco Use: Medium Risk (08/08/2023)  Health Literacy: Adequate Health Literacy (07/28/2023)    Nathen Balder, MSW, LCSW Clinical Social Worker II [facility] Health Heart/Vascular Care Navigation  [phone]- work cell phone (preferred)

## 2023-08-16 NOTE — Patient Outreach (Signed)
 Complex Care Management   Visit Note  08/16/2023  Name:  [name] MRN: [id number] DOB: 23-Apr-1955  Situation: Referral received for Complex Care Management related to SDOH Barriers:  Financial Resource Strain I obtained verbal consent from Patient.  Visit completed with patient   on the phone  Background:   Past Medical History:  Diagnosis Date   Allergy 2010   Anemia    takes Ferrous Sulfate  daily   Anxiety    takes Citalopram  daily   Arthritis    Asthma    2004-prior to gastric bypass and no problems since   CAP (community acquired pneumonia) 07/23/2021   CHF (congestive heart failure) (HCC)    takes Lasix  daily as needed   Chronic back pain    spondylolisthesis/stenosis/radiculopathy   CKD (chronic kidney disease) stage 2, GFR 60-89 ml/min    Complication of anesthesia yrs ago   slow to wake up   Depression    Diabetes (HCC)    DVT (deep venous thrombosis) (HCC) 01/17/2013   past hx. -tx.5-6 yrs ago bilateral legs, occ. sporadic swelling, has IVC filter implanted   Dysrhythmia    "heart tends to flutter"   Fibromyalgia    Fracture    right foot and is in a cam boot   Gallstones    GERD (gastroesophageal reflux disease)    hx of-no meds now   HCAP (healthcare-associated pneumonia) 02/19/2023   Heart murmur    History of bronchitis 2012 or 2013   History of colon polyps    Hypertension    takes Metoprolol  daily   Insomnia    takes Melatonin daily   Microalbuminuria    Pelvic floor dysfunction    Peripheral neuropathy    Pneumonia 90's   hx of   Recurrent Clostridioides difficile infection 02/28/2020   S/P gastric bypass 2003   Sleep apnea    no cpap used in many yrs after weight lost-no machine now   Urinary frequency    Urinary urgency    Vitamin D  deficiency     Assessment: Patient was having some financial difficulty and in threat of having her lights disconnected  Recommendation:   SW provided resources for utility assistance  Follow  Up Plan:   Telephone follow up appointment date/time:  08/30/2023 at 11:00 am  [name], PhD [facility], [facility] Social Worker Direct Dial: [phone]  Fax: [phone]

## 2023-08-16 NOTE — Patient Instructions (Signed)
 Visit Information  Thank you for taking time to visit with me today. Please don't hesitate to contact me if I can be of assistance to you before our next scheduled appointment.  Your next care management appointment is by telephone on 08/30/23 at 0930am  Telephone follow up appointment date/time:  08/30/2023 0930  Please call the care guide team at [phone] if you need to cancel, schedule, or reschedule an appointment.   Please call 911 if you are experiencing a Mental Health or Behavioral Health Crisis or need someone to talk to.  Fletcher Humble MSW, LCSW Licensed Clinical Social Worker  [facility], Population Health Direct Dial: [phone]  Fax: [phone]

## 2023-08-18 ENCOUNTER — Other Ambulatory Visit (HOSPITAL_BASED_OUTPATIENT_CLINIC_OR_DEPARTMENT_OTHER): Payer: Self-pay | Admitting: Family Medicine

## 2023-08-18 ENCOUNTER — Telehealth: Payer: Self-pay | Admitting: Pharmacist

## 2023-08-18 DIAGNOSIS — G8929 Other chronic pain: Secondary | ICD-10-CM | POA: Diagnosis not present

## 2023-08-18 DIAGNOSIS — J4540 Moderate persistent asthma, uncomplicated: Secondary | ICD-10-CM

## 2023-08-18 DIAGNOSIS — Q6100 Congenital renal cyst, unspecified: Secondary | ICD-10-CM | POA: Diagnosis not present

## 2023-08-18 DIAGNOSIS — M549 Dorsalgia, unspecified: Secondary | ICD-10-CM | POA: Diagnosis not present

## 2023-08-18 DIAGNOSIS — M4800 Spinal stenosis, site unspecified: Secondary | ICD-10-CM | POA: Diagnosis not present

## 2023-08-18 DIAGNOSIS — M5010 Cervical disc disorder with radiculopathy, unspecified cervical region: Secondary | ICD-10-CM | POA: Diagnosis not present

## 2023-08-18 DIAGNOSIS — E611 Iron deficiency: Secondary | ICD-10-CM | POA: Diagnosis not present

## 2023-08-18 DIAGNOSIS — N182 Chronic kidney disease, stage 2 (mild): Secondary | ICD-10-CM | POA: Diagnosis not present

## 2023-08-18 DIAGNOSIS — G4700 Insomnia, unspecified: Secondary | ICD-10-CM | POA: Diagnosis not present

## 2023-08-18 DIAGNOSIS — M5416 Radiculopathy, lumbar region: Secondary | ICD-10-CM | POA: Diagnosis not present

## 2023-08-18 DIAGNOSIS — M1990 Unspecified osteoarthritis, unspecified site: Secondary | ICD-10-CM | POA: Diagnosis not present

## 2023-08-18 DIAGNOSIS — L304 Erythema intertrigo: Secondary | ICD-10-CM

## 2023-08-18 DIAGNOSIS — I493 Ventricular premature depolarization: Secondary | ICD-10-CM | POA: Diagnosis not present

## 2023-08-18 DIAGNOSIS — M439 Deforming dorsopathy, unspecified: Secondary | ICD-10-CM | POA: Diagnosis not present

## 2023-08-18 DIAGNOSIS — M797 Fibromyalgia: Secondary | ICD-10-CM | POA: Diagnosis not present

## 2023-08-18 DIAGNOSIS — E559 Vitamin D deficiency, unspecified: Secondary | ICD-10-CM | POA: Diagnosis not present

## 2023-08-18 DIAGNOSIS — G4733 Obstructive sleep apnea (adult) (pediatric): Secondary | ICD-10-CM | POA: Diagnosis not present

## 2023-08-18 DIAGNOSIS — D631 Anemia in chronic kidney disease: Secondary | ICD-10-CM | POA: Diagnosis not present

## 2023-08-18 DIAGNOSIS — I491 Atrial premature depolarization: Secondary | ICD-10-CM | POA: Diagnosis not present

## 2023-08-18 DIAGNOSIS — I509 Heart failure, unspecified: Secondary | ICD-10-CM | POA: Diagnosis not present

## 2023-08-18 DIAGNOSIS — I130 Hypertensive heart and chronic kidney disease with heart failure and stage 1 through stage 4 chronic kidney disease, or unspecified chronic kidney disease: Secondary | ICD-10-CM | POA: Diagnosis not present

## 2023-08-18 DIAGNOSIS — I951 Orthostatic hypotension: Secondary | ICD-10-CM | POA: Diagnosis not present

## 2023-08-18 DIAGNOSIS — E1142 Type 2 diabetes mellitus with diabetic polyneuropathy: Secondary | ICD-10-CM | POA: Diagnosis not present

## 2023-08-18 DIAGNOSIS — J45909 Unspecified asthma, uncomplicated: Secondary | ICD-10-CM | POA: Diagnosis not present

## 2023-08-18 DIAGNOSIS — E1122 Type 2 diabetes mellitus with diabetic chronic kidney disease: Secondary | ICD-10-CM | POA: Diagnosis not present

## 2023-08-18 MED ORDER — NYSTATIN 100000 UNIT/GM EX CREA: 1.0000 | TOPICAL_CREAM | Freq: Two times a day (BID) | CUTANEOUS | 3 refills | Status: AC

## 2023-08-18 NOTE — Progress Notes (Addendum)
   08/18/2023  Patient ID: Alexia Idler, female   DOB: Oct 05, 1955, 68 y.o.   MRN: [id number]  Tried calling patient regarding [facility] follow-up. Appears she received Albuterol  solution from [facility], but would still need the Budesonide  if not received from pharmacy.   Left message requesting call back at earliest convenience to discuss further.   Update from 08/18/23:  - Returned call to patient after voicemail left earlier today - Reports $27 for a cream is too much- would prefer to look for alternatives  - Reviewed all options for vaginal atrophy: vaginal insert/tablet, creams, rings, etc   - Option selected is cheapest available (oral estradiol  is no charge, but not for same issue) - Has NOT heard from [facility]- appears nurse has yet to fax information over to them - Requested something for yeast infection on skin on Tuesday (nothing noted in chart)- sending message to PCP regarding concerns  - Cheapest option is Nystatin  1000,000U/gm cream for $5 or Clotrimazole 1% for $5 - Still no response regarding [facility] as well  Update from 5/7: Budesonide  Rx manually faxed to [facility] fax [phone] as recommended when calling them about script yesterday.    Delvin File, PharmD [facility] Health  Phone Number: [phone]

## 2023-08-18 NOTE — Addendum Note (Signed)
 Addended by: Romone Shaff on: 08/18/2023 05:47 PM   Modules accepted: Orders

## 2023-08-19 ENCOUNTER — Telehealth: Payer: Self-pay | Admitting: Licensed Clinical Social Worker

## 2023-08-19 ENCOUNTER — Encounter (HOSPITAL_BASED_OUTPATIENT_CLINIC_OR_DEPARTMENT_OTHER): Payer: Self-pay | Admitting: Family Medicine

## 2023-08-19 NOTE — Telephone Encounter (Signed)
 Please see mychart message sent by pt and advise.

## 2023-08-19 NOTE — Telephone Encounter (Signed)
 H&V Care Navigation CSW Progress Note  Clinical Social Worker contacted patient by phone to f/u on utility assistance. No answer today at [phone]. Left voicemail with information about [facility] [employer] and reminded her funding opens Monday she can call directly and apply if eligible with their team from 9-930am, left my number as well if needed.  Patient is participating in a Managed Medicaid Plan:  No, [employer] Medicare  SDOH Screenings   Food Insecurity: Food Insecurity Present (08/16/2023)  Housing: Low Risk  (08/16/2023)  Transportation Needs: No Transportation Needs (08/16/2023)  Recent Concern: Transportation Needs - Unmet Transportation Needs (07/28/2023)  Utilities: At Risk (08/16/2023)  Alcohol  Screen: Low Risk  (02/14/2023)  Depression (PHQ2-9): Low Risk  (08/16/2023)  Financial Resource Strain: High Risk (08/15/2023)  Physical Activity: Inactive (04/01/2023)  Social Connections: Moderately Integrated (04/01/2023)  Stress: Stress Concern Present (07/28/2023)  Tobacco Use: Medium Risk (08/08/2023)  Health Literacy: Adequate Health Literacy (07/28/2023)   Nathen Balder, MSW, LCSW Clinical Social Worker II [facility] Health Heart/Vascular Care Navigation  [phone]- work cell phone (preferred)

## 2023-08-22 ENCOUNTER — Encounter (HOSPITAL_BASED_OUTPATIENT_CLINIC_OR_DEPARTMENT_OTHER): Payer: Self-pay | Admitting: Family Medicine

## 2023-08-22 NOTE — Progress Notes (Signed)
 Hi [name],  Your liver function has returned to baseline and your A1C is improved down to 5.3 as well. If you are still desiring to start GLP 1 therapy for weight loss, please check with the PCP you are establishing with and your GI provider to ensure you are safe to take this with pancreatic insufficiency.

## 2023-08-23 ENCOUNTER — Ambulatory Visit: Admitting: Nurse Practitioner

## 2023-08-23 ENCOUNTER — Telehealth: Payer: Self-pay | Admitting: Licensed Clinical Social Worker

## 2023-08-23 MED ORDER — BUDESONIDE 0.25 MG/2ML IN SUSP
0.2500 mg | Freq: Two times a day (BID) | RESPIRATORY_TRACT | 11 refills | Status: DC
Start: 2023-08-23 — End: 2023-08-23

## 2023-08-23 MED ORDER — BUDESONIDE 0.25 MG/2ML IN SUSP: 0.2500 mg | Freq: Two times a day (BID) | RESPIRATORY_TRACT | 11 refills | Status: DC

## 2023-08-23 NOTE — Addendum Note (Signed)
 Addended by: Daved Mcfann M on: 08/23/2023 04:39 PM   Modules accepted: Orders

## 2023-08-23 NOTE — Telephone Encounter (Signed)
 H&V Care Navigation CSW Progress Note  Clinical Social Worker contacted patient by phone to f/u on utility assistance. No answer today at [phone]. Left voicemail with information about [employer] and reminded her funding opens Monday she can call directly and apply if eligible with their team from 9-930am, left my number as well if needed.   Patient is participating in a Managed Medicaid Plan:  No, [employer] Medicare only  SDOH Screenings   Food Insecurity: Food Insecurity Present (08/16/2023)  Housing: Low Risk  (08/16/2023)  Transportation Needs: No Transportation Needs (08/16/2023)  Recent Concern: Transportation Needs - Unmet Transportation Needs (07/28/2023)  Utilities: At Risk (08/16/2023)  Alcohol  Screen: Low Risk  (02/14/2023)  Depression (PHQ2-9): Low Risk  (08/16/2023)  Financial Resource Strain: High Risk (08/15/2023)  Physical Activity: Inactive (04/01/2023)  Social Connections: Moderately Integrated (04/01/2023)  Stress: Stress Concern Present (07/28/2023)  Tobacco Use: Medium Risk (08/08/2023)  Health Literacy: Adequate Health Literacy (07/28/2023)    Nathen Balder, MSW, LCSW Clinical Social Worker II [facility] Health Heart/Vascular Care Navigation  [phone]- work cell phone (preferred)

## 2023-08-24 ENCOUNTER — Other Ambulatory Visit: Payer: Self-pay | Admitting: Urology

## 2023-08-24 DIAGNOSIS — I509 Heart failure, unspecified: Secondary | ICD-10-CM | POA: Diagnosis not present

## 2023-08-24 DIAGNOSIS — E559 Vitamin D deficiency, unspecified: Secondary | ICD-10-CM | POA: Diagnosis not present

## 2023-08-24 DIAGNOSIS — D631 Anemia in chronic kidney disease: Secondary | ICD-10-CM | POA: Diagnosis not present

## 2023-08-24 DIAGNOSIS — M4800 Spinal stenosis, site unspecified: Secondary | ICD-10-CM | POA: Diagnosis not present

## 2023-08-24 DIAGNOSIS — E1122 Type 2 diabetes mellitus with diabetic chronic kidney disease: Secondary | ICD-10-CM | POA: Diagnosis not present

## 2023-08-24 DIAGNOSIS — I951 Orthostatic hypotension: Secondary | ICD-10-CM | POA: Diagnosis not present

## 2023-08-24 DIAGNOSIS — Q6100 Congenital renal cyst, unspecified: Secondary | ICD-10-CM | POA: Diagnosis not present

## 2023-08-24 DIAGNOSIS — J45909 Unspecified asthma, uncomplicated: Secondary | ICD-10-CM | POA: Diagnosis not present

## 2023-08-24 DIAGNOSIS — E1142 Type 2 diabetes mellitus with diabetic polyneuropathy: Secondary | ICD-10-CM | POA: Diagnosis not present

## 2023-08-24 DIAGNOSIS — E611 Iron deficiency: Secondary | ICD-10-CM | POA: Diagnosis not present

## 2023-08-24 DIAGNOSIS — I491 Atrial premature depolarization: Secondary | ICD-10-CM | POA: Diagnosis not present

## 2023-08-24 DIAGNOSIS — M5416 Radiculopathy, lumbar region: Secondary | ICD-10-CM | POA: Diagnosis not present

## 2023-08-24 DIAGNOSIS — M549 Dorsalgia, unspecified: Secondary | ICD-10-CM | POA: Diagnosis not present

## 2023-08-24 DIAGNOSIS — M797 Fibromyalgia: Secondary | ICD-10-CM | POA: Diagnosis not present

## 2023-08-24 DIAGNOSIS — G4733 Obstructive sleep apnea (adult) (pediatric): Secondary | ICD-10-CM | POA: Diagnosis not present

## 2023-08-24 DIAGNOSIS — N182 Chronic kidney disease, stage 2 (mild): Secondary | ICD-10-CM | POA: Diagnosis not present

## 2023-08-24 DIAGNOSIS — N952 Postmenopausal atrophic vaginitis: Secondary | ICD-10-CM

## 2023-08-24 DIAGNOSIS — I493 Ventricular premature depolarization: Secondary | ICD-10-CM | POA: Diagnosis not present

## 2023-08-24 DIAGNOSIS — M439 Deforming dorsopathy, unspecified: Secondary | ICD-10-CM | POA: Diagnosis not present

## 2023-08-24 DIAGNOSIS — G4700 Insomnia, unspecified: Secondary | ICD-10-CM | POA: Diagnosis not present

## 2023-08-24 DIAGNOSIS — I130 Hypertensive heart and chronic kidney disease with heart failure and stage 1 through stage 4 chronic kidney disease, or unspecified chronic kidney disease: Secondary | ICD-10-CM | POA: Diagnosis not present

## 2023-08-24 DIAGNOSIS — M5010 Cervical disc disorder with radiculopathy, unspecified cervical region: Secondary | ICD-10-CM | POA: Diagnosis not present

## 2023-08-24 DIAGNOSIS — M1990 Unspecified osteoarthritis, unspecified site: Secondary | ICD-10-CM | POA: Diagnosis not present

## 2023-08-24 DIAGNOSIS — G8929 Other chronic pain: Secondary | ICD-10-CM | POA: Diagnosis not present

## 2023-08-26 ENCOUNTER — Telehealth: Payer: Self-pay | Admitting: Pharmacist

## 2023-08-26 NOTE — Progress Notes (Signed)
   08/26/2023  Patient ID: [name], female   DOB: 06/12/1955, 67 y.o.   MRN: [id number]  Received message in EPIC from [facility]. Said they cannot accept scripts both ways they have been sent. Need a wet signature on the script.   Form to be completed has been emailed to me and uploaded in the chart. Will need PCP's signature and will attach additional information as needed.   Called and LVM on 5/16 and 5/19 with [facility] regarding Budesonide  script- still no returned call regarding order   Delvin File, PharmD [facility] Health  Phone Number: [phone]

## 2023-08-29 ENCOUNTER — Telehealth: Payer: Self-pay | Admitting: Licensed Clinical Social Worker

## 2023-08-29 NOTE — Telephone Encounter (Signed)
 H&V Care Navigation CSW Progress Note  Clinical Social Worker contacted patient by phone again this morning to f/u on utility assistance. No answer again today at [phone]. Left voicemail to check and see if she was able to reach [facility] and if she ever received Extra Help/LIS information (confirmation of approval or denial), left my number again. Remain available as needed should pt return my calls.    Patient is participating in a Managed Medicaid Plan:  No, UHC Medicare and Medicaid  SDOH Screenings   Food Insecurity: Food Insecurity Present (08/16/2023)  Housing: Low Risk  (08/16/2023)  Transportation Needs: No Transportation Needs (08/16/2023)  Recent Concern: Transportation Needs - Unmet Transportation Needs (07/28/2023)  Utilities: At Risk (08/16/2023)  Alcohol  Screen: Low Risk  (02/14/2023)  Depression (PHQ2-9): Low Risk  (08/16/2023)  Financial Resource Strain: High Risk (08/15/2023)  Physical Activity: Inactive (04/01/2023)  Social Connections: Moderately Integrated (04/01/2023)  Stress: Stress Concern Present (07/28/2023)  Tobacco Use: Medium Risk (08/08/2023)  Health Literacy: Adequate Health Literacy (07/28/2023)     Nathen Balder, MSW, LCSW Clinical Social Worker II [facility] Heart/Vascular Care Navigation  [phone]- work cell phone (preferred)

## 2023-08-30 ENCOUNTER — Other Ambulatory Visit: Payer: Self-pay | Admitting: Licensed Clinical Social Worker

## 2023-08-30 ENCOUNTER — Telehealth (HOSPITAL_BASED_OUTPATIENT_CLINIC_OR_DEPARTMENT_OTHER): Payer: Self-pay | Admitting: Family Medicine

## 2023-08-30 NOTE — Telephone Encounter (Signed)
 Recieved Via fax Made Copies Attached billing form  placed in Providers Box Advised pt to allow 7-10 business days

## 2023-08-30 NOTE — Patient Instructions (Signed)

## 2023-08-30 NOTE — Patient Outreach (Signed)
 Complex Care Management   Visit Note  08/30/2023  Name:  [name] MRN: [id number] DOB: [birth year]  Situation: Referral received for Complex Care Management related to SDOH Barriers:  Financial Resource Strain I obtained verbal consent from Patient.  Visit completed with patient  on the phone  Background:   Past Medical History:  Diagnosis Date   Allergy 2010   Anemia    takes Ferrous Sulfate  daily   Anxiety    takes Citalopram  daily   Arthritis    Asthma    2004-prior to gastric bypass and no problems since   CAP (community acquired pneumonia) 07/23/2021   CHF (congestive heart failure) (HCC)    takes Lasix  daily as needed   Chronic back pain    spondylolisthesis/stenosis/radiculopathy   CKD (chronic kidney disease) stage 2, GFR 60-89 ml/min    Complication of anesthesia yrs ago   slow to wake up   Depression    Diabetes (HCC)    DVT (deep venous thrombosis) (HCC) 01/17/2013   past hx. -tx.5-6 yrs ago bilateral legs, occ. sporadic swelling, has IVC filter implanted   Dysrhythmia    "heart tends to flutter"   Fibromyalgia    Fracture    right foot and is in a cam boot   Gallstones    GERD (gastroesophageal reflux disease)    hx of-no meds now   HCAP (healthcare-associated pneumonia) 02/19/2023   Heart murmur    History of bronchitis 2012 or 2013   History of colon polyps    Hypertension    takes Metoprolol  daily   Insomnia    takes Melatonin daily   Microalbuminuria    Pelvic floor dysfunction    Peripheral neuropathy    Pneumonia 90's   hx of   Recurrent Clostridioides difficile infection 02/28/2020   S/P gastric bypass 2003   Sleep apnea    no cpap used in many yrs after weight lost-no machine now   Urinary frequency    Urinary urgency    Vitamin D  deficiency     Assessment: Patient was able to pay on her light bill not the amount in which she was supposed to pay but she paid $800 of the balance, her lights are still on and no further follow up  at this time SDOH Interventions    Flowsheet Row Patient Outreach Telephone from 08/16/2023 in [place] [facility]ATION [facility] DEPARTMENT Telephone from 08/15/2023 in [place] POPULATION HEALTH DEPARTMENT Telephone from 07/28/2023 in [place] [facility] Patient Outreach from 07/26/2023 in [place] POPULATION HEALTH DEPARTMENT Care Coordination from 06/30/2023 in [facility] Community Care Coordination Care Coordination from 06/09/2023 in [facility] Community Care Coordination  SDOH Interventions        Food Insecurity Interventions -- -- [employer] Provided Intervention Not Indicated Intervention Not Indicated Intervention Not Indicated  Housing Interventions Intervention Not Indicated -- Intervention Not Indicated Intervention Not Indicated Intervention Not Indicated Other (Comment)  [SW mailed out senior living resources for the patients possible move]  Transportation Interventions -- -- [facility] Psychologist, clinical), [employer] Resources Provided AMB Referral Intervention Not Indicated Community Resources Provided  [employer] sister provides transportation, Patient wants the [employer], SW will mail]  Utilities Interventions Community Resources Provided  [lieap] [employer] Provided [employer] Provided AMB Referral, [employer] Resources Provided Intervention Not Indicated Intervention Not Indicated  Financial Strain Interventions -- Programmer, applications Provided [employer] Provided -- -- --  Stress Interventions -- -- [employer]  Provided -- -- --  Health Literacy Interventions -- -- Intervention Not Indicated -- -- --         Recommendation:   none  Follow Up Plan:   Closing From:  Complex Care Management  Jessica Neighbours, PhD [facility], [facility] Social Worker Direct Dial: [phone]  Fax: [phone]

## 2023-08-30 NOTE — Patient Instructions (Signed)

## 2023-08-30 NOTE — Patient Outreach (Signed)
 Complex Care Management   Visit Note  08/30/2023  Name:  [name] MRN: [id number] DOB: 09/01/1955  Situation: Referral received for Complex Care Management related to SDOH Barriers:  Food insecurity Lack of essential utilities electricity I obtained verbal consent from Patient.  Visit completed with Thayer Finders   on the phone  Background:   Past Medical History:  Diagnosis Date   Allergy 2010   Anemia    takes Ferrous Sulfate  daily   Anxiety    takes Citalopram  daily   Arthritis    Asthma    2004-prior to gastric bypass and no problems since   CAP (community acquired pneumonia) 07/23/2021   CHF (congestive heart failure) (HCC)    takes Lasix  daily as needed   Chronic back pain    spondylolisthesis/stenosis/radiculopathy   CKD (chronic kidney disease) stage 2, GFR 60-89 ml/min    Complication of anesthesia yrs ago   slow to wake up   Depression    Diabetes (HCC)    DVT (deep venous thrombosis) (HCC) 01/17/2013   past hx. -tx.5-6 yrs ago bilateral legs, occ. sporadic swelling, has IVC filter implanted   Dysrhythmia    "heart tends to flutter"   Fibromyalgia    Fracture    right foot and is in a cam boot   Gallstones    GERD (gastroesophageal reflux disease)    hx of-no meds now   HCAP (healthcare-associated pneumonia) 02/19/2023   Heart murmur    History of bronchitis 2012 or 2013   History of colon polyps    Hypertension    takes Metoprolol  daily   Insomnia    takes Melatonin daily   Microalbuminuria    Pelvic floor dysfunction    Peripheral neuropathy    Pneumonia 90's   hx of   Recurrent Clostridioides difficile infection 02/28/2020   S/P gastric bypass 2003   Sleep apnea    no cpap used in many yrs after weight lost-no machine now   Urinary frequency    Urinary urgency    Vitamin D  deficiency     Assessment: Patient Reported Symptoms:  Cognitive Cognitive Status: Able to follow simple commands, Alert and oriented to person, place, and time       Neurological Neurological Review of Symptoms: No symptoms reported    HEENT HEENT Symptoms Reported: No symptoms reported      Cardiovascular Cardiovascular Symptoms Reported: No symptoms reported    Respiratory Respiratory Symptoms Reported: No symptoms reported    Endocrine Patient reports the following symptoms related to hypoglycemia or hyperglycemia : No symptoms reported    Gastrointestinal Gastrointestinal Symptoms Reported: No symptoms reported      Genitourinary Genitourinary Symptoms Reported: No symptoms reported    Integumentary Integumentary Symptoms Reported: No symptoms reported    Musculoskeletal Musculoskelatal Symptoms Reviewed: No symptoms reported        Psychosocial Psychosocial Symptoms Reported: No symptoms reported            08/16/2023    3:12 PM  Depression screen PHQ 2/9  Decreased Interest 0  Down, Depressed, Hopeless 0  PHQ - 2 Score 0  Altered sleeping 1  Tired, decreased energy 1  Feeling bad or failure about yourself  0  Trouble concentrating 0  Moving slowly or fidgety/restless 0  Suicidal thoughts 0  PHQ-9 Score 2    There were no vitals filed for this visit.  Medications Reviewed Today     Reviewed by Fletcher Humble, LCSW (Social Worker) on 08/30/23  at 1714  Med List Status: <None>   Medication Order Taking? Sig Documenting Provider Last Dose Status Informant  albuterol  (PROVENTIL ) (2.5 MG/3ML) 0.083% nebulizer solution [id number] No Take 3 mLs (2.5 mg total) by nebulization every 6 (six) hours as needed for wheezing or shortness of breath. Nonda Bays, FNP Taking Active   albuterol  (VENTOLIN  HFA) [phone] Base) MCG/ACT inhaler [id number] No INHALE 2 PUFFS INTO THE LUNGS EVERY 6 HOURS AS NEEDED FOR WHEEZING OR SHORTNESS OF BREATH Adelia Homestead, MD Taking Active Self, Pharmacy Records  amLODipine  (NORVASC ) 10 MG tablet [id number] No Take 1 tablet (10 mg total) by mouth daily. Nonda Bays, FNP Taking  Active   Ascorbic Acid  (VITAMIN C ) 1000 MG tablet [id number] No Take 1,000 mg by mouth daily. [provider] Taking Active Self, Pharmacy Records  aspirin  EC 81 MG tablet [id number] No Take 1 tablet (81 mg total) by mouth daily. Swallow whole. Nonda Bays, FNP Taking Active   azelastine  (ASTELIN ) 0.1 % nasal spray [id number]  Place 1 spray into both nostrils 2 (two) times daily. Use in each nostril as directed Aleck Hurdle, MD  Active   benzonatate  (TESSALON ) 100 MG capsule [id number]  Take 1 capsule (100 mg total) by mouth every 8 (eight) hours as needed for cough. Nonda Bays, FNP  Active   budesonide  (PULMICORT ) 0.25 MG/2ML nebulizer solution [id number]  Take 2 mLs (0.25 mg total) by nebulization in the morning and at bedtime. Dx code: J34.4 Desai, Nikita S, MD  Active   buPROPion  (WELLBUTRIN  XL) 300 MG 24 hr tablet [id number] No TAKE 1 TABLET(300 MG) BY MOUTH DAILY Caudle, Arcola Kocher, FNP Taking Active   estradiol  (ESTRACE ) 0.1 MG/GM vaginal cream [id number]  USE A PEA SIZED AMOUNT ON YOUR INDEX FINGER AND APPLY TO VAGINAL OPENING 2 NIGHTS A Ailene Alexandria, MD  Active   ferrous sulfate  325 (65 FE) MG tablet [id number] No Take 1 tablet (325 mg total) by mouth daily with breakfast. Macdonald Savoy, MD Taking Active   fluticasone  (FLONASE ) 50 MCG/ACT nasal spray [id number] No Place 2 sprays into both nostrils daily. Nonda Bays, FNP Taking Active   gabapentin  (NEURONTIN ) 100 MG capsule [id number] No Take 2 capsules (200 mg total) by mouth 2 (two) times daily. Izora Marten, PA-C Taking Active Self, Pharmacy Records           Med Note Burley Carpenter, LISA   Tue Jul 26, 2023 11:02 AM) Patient taking 100 mg capsule one in am, one at bedtime  ipratropium (ATROVENT ) 0.02 % nebulizer solution [id number] No Take 2.5 mLs (0.5 mg total) by nebulization every 6 (six) hours as needed for wheezing or shortness of breath. Nonda Bays, FNP Taking Active    lipase/protease/amylase (CREON ) 36000 UNITS CPEP capsule [id number] No Take 36,000 Units by mouth 4 (four) times daily as needed (Stomach Pain). [provider] Taking Active Self, Pharmacy Records  loperamide  (IMODIUM ) 2 MG capsule [id number] No Take 1 capsule (2 mg total) by mouth as needed for diarrhea or loose stools. Meuth, Quillian Brunt, PA-C Taking Active Self, Pharmacy Records  montelukast  (SINGULAIR ) 10 MG tablet [id number] No Take 1 tablet (10 mg total) by mouth at bedtime. Adelia Homestead, MD Taking Active Self, Pharmacy Records  Multiple Vitamin (MULTI-VITAMIN) tablet [id number] No Take 1 tablet by mouth daily. [provider] Taking Active Self, Pharmacy Records  nystatin  cream (MYCOSTATIN ) [id number]  Apply 1 Application topically 2 (two) times daily. Caudle, Arcola Kocher, FNP  Active   ondansetron  (ZOFRAN ) 4 MG tablet [id number] No Take 1 tablet (4 mg total) by mouth every 8 (eight) hours as needed for nausea or vomiting. Rai, Hurman Maiden, MD Taking Active   pantoprazole  (PROTONIX ) 40 MG tablet [id number] No Take 1 tablet (40 mg total) by mouth 2 (two) times daily before a meal. Aleck Hurdle, MD Taking Active   PARoxetine  (PAXIL ) 20 MG tablet [id number] No Take 1 tablet (20 mg total) by mouth daily. Nonda Bays, FNP Taking Active   potassium chloride  SA (KLOR-CON  M) 20 MEQ tablet [id number] No Take 1 tablet (20 mEq total) by mouth as needed (Take on tablet once a day as needed witht he aditional dose of torsemide .). Arleen Lacer, MD Taking Active   torsemide  (DEMADEX ) 10 MG tablet [id number] No Take 1 tablet (10 mg total) by mouth daily. Take an additional 10 mg once a day for Lower extremity swelling, SOB, weight gain of 3 lbs overnight and 5 lbs [name] a weeks. Arleen Lacer, MD Taking Active   traMADol  (ULTRAM ) 50 MG tablet [id number] No Take 1 tablet (50 mg total) by mouth every 6 (six) hours as needed for severe pain (pain score 7-10). Nonda Bays,  FNP Taking Active   traZODone  (DESYREL ) 50 MG tablet [id number] No Take 1 tablet (50 mg total) by mouth at bedtime as needed for sleep. Adelia Homestead, MD Taking Active Self, Pharmacy Records  VITAMIN D  PO [id number] No Take 1 capsule by mouth daily. [provider] Taking Active Self, Pharmacy Records            Recommendation:   Contact [facility] [employer] for distribution  Follow Up Plan:   Patient has met all care management goals. Care Management case will be closed. Patient has been provided contact information should new needs arise.   Fletcher Humble MSW, LCSW Licensed Clinical Social Worker  [facility], Population Health Direct Dial: [phone]  Fax: [phone]

## 2023-08-31 ENCOUNTER — Other Ambulatory Visit: Payer: Self-pay

## 2023-08-31 ENCOUNTER — Other Ambulatory Visit (HOSPITAL_BASED_OUTPATIENT_CLINIC_OR_DEPARTMENT_OTHER): Payer: Self-pay

## 2023-08-31 NOTE — Patient Outreach (Signed)
 Complex Care Management   Visit Note  08/31/2023  Name:  [name] MRN: [id number] DOB: June 28, 1955  Situation: Referral received for Complex Care Management related to Heart Failure and Diabetes with Complications I obtained verbal consent from Patient.  Visit completed with patient  on the phone  Background:   Past Medical History:  Diagnosis Date   Allergy 2010   Anemia    takes Ferrous Sulfate  daily   Anxiety    takes Citalopram  daily   Arthritis    Asthma    2004-prior to gastric bypass and no problems since   CAP (community acquired pneumonia) 07/23/2021   CHF (congestive heart failure) (HCC)    takes Lasix  daily as needed   Chronic back pain    spondylolisthesis/stenosis/radiculopathy   CKD (chronic kidney disease) stage 2, GFR 60-89 ml/min    Complication of anesthesia yrs ago   slow to wake up   Depression    Diabetes (HCC)    DVT (deep venous thrombosis) (HCC) 01/17/2013   past hx. -tx.5-6 yrs ago bilateral legs, occ. sporadic swelling, has IVC filter implanted   Dysrhythmia    "heart tends to flutter"   Fibromyalgia    Fracture    right foot and is in a cam boot   Gallstones    GERD (gastroesophageal reflux disease)    hx of-no meds now   HCAP (healthcare-associated pneumonia) 02/19/2023   Heart murmur    History of bronchitis 2012 or 2013   History of colon polyps    Hypertension    takes Metoprolol  daily   Insomnia    takes Melatonin daily   Microalbuminuria    Pelvic floor dysfunction    Peripheral neuropathy    Pneumonia 90's   hx of   Recurrent Clostridioides difficile infection 02/28/2020   S/P gastric bypass 2003   Sleep apnea    no cpap used in many yrs after weight lost-no machine now   Urinary frequency    Urinary urgency    Vitamin D  deficiency     Assessment: Patient Reported Symptoms:  Cognitive        Neurological Neurological Review of Symptoms: Headaches (patient reports headache in the back of head for 3-4  days) Neurological Conditions: Headache Neurological Management Strategies: Adequate rest, Medication therapy, Routine screening  HEENT HEENT Symptoms Reported: No symptoms reported      Cardiovascular Cardiovascular Symptoms Reported: No symptoms reported (patient reports abdomen swelling and weight gain, will take extra dose of torsemide  today, and montior closely) Cardiovascular Conditions: Heart failure, Hypertension Cardiovascular Management Strategies: Routine screening, Medication therapy, Weight management Do You Have a Working Readable Scale?: Yes Weight: 257 lb (116.6 kg) Cardiovascular Self-Management Outcome: 3 (uncertain)  Respiratory Respiratory Symptoms Reported: Shortness of breath Additional Respiratory Details: uses nebulizer, education on warning signs for fluid overload Respiratory Conditions: Shortness of breath  Endocrine Is patient diabetic?: Yes Is patient checking blood sugars at home?: Yes Endocrine Conditions: Diabetes Endocrine Management Strategies: Weight management, Medication therapy, Routine screening, Diet modification Endocrine Self-Management Outcome: 4 (good)  Gastrointestinal Gastrointestinal Symptoms Reported: Abdominal pain or discomfort (left side umbilical pain  has appointment with GI 09/02/23) Gastrointestinal Conditions: Abdominal pain Gastrointestinal Management Strategies: Diet modification Gastrointestinal Self-Management Outcome: 3 (uncertain) Nutrition Risk Screen (CP): No indicators present  Genitourinary Genitourinary Symptoms Reported: No symptoms reported, Urgency Additional Genitourinary Details: takes cranberry pills Genitourinary Conditions: Frequency (has urology appointment scheduled) Genitourinary Management Strategies: Medication therapy  Integumentary Integumentary Symptoms Reported: No symptoms reported  Musculoskeletal Musculoskelatal Symptoms Reviewed: No symptoms reported        Psychosocial Psychosocial Symptoms  Reported: No symptoms reported            08/16/2023    3:12 PM  Depression screen PHQ 2/9  Decreased Interest 0  Down, Depressed, Hopeless 0  PHQ - 2 Score 0  Altered sleeping 1  Tired, decreased energy 1  Feeling bad or failure about yourself  0  Trouble concentrating 0  Moving slowly or fidgety/restless 0  Suicidal thoughts 0  PHQ-9 Score 2    Vitals:   08/31/23 1517  BP: (!) 143/81    Medications Reviewed Today     Reviewed by Clarnce Crow, RN (Registered Nurse) on 08/31/23 at 1525  Med List Status: <None>   Medication Order Taking? Sig Documenting Provider Last Dose Status Informant  albuterol  (PROVENTIL ) (2.5 MG/3ML) 0.083% nebulizer solution [id number] Yes Take 3 mLs (2.5 mg total) by nebulization every 6 (six) hours as needed for wheezing or shortness of breath. Nonda Bays, FNP Taking Active   albuterol  (VENTOLIN  HFA) 108 [phone] Base) MCG/ACT inhaler [id number] Yes INHALE 2 PUFFS INTO THE LUNGS EVERY 6 HOURS AS NEEDED FOR WHEEZING OR SHORTNESS OF BREATH Adelia Homestead, MD Taking Active Self, Pharmacy Records  amLODipine  (NORVASC ) 10 MG tablet [id number] Yes Take 1 tablet (10 mg total) by mouth daily. Nonda Bays, FNP Taking Active   Ascorbic Acid  (VITAMIN C ) 1000 MG tablet [id number] Yes Take 1,000 mg by mouth daily. [provider] Taking Active Self, Pharmacy Records  aspirin  EC 81 MG tablet [id number] Yes Take 1 tablet (81 mg total) by mouth daily. Swallow whole. Nonda Bays, FNP Taking Active   azelastine  (ASTELIN ) 0.1 % nasal spray [id number] Yes Place 1 spray into both nostrils 2 (two) times daily. Use in each nostril as directed Aleck Hurdle, MD Taking Active   benzonatate  (TESSALON ) 100 MG capsule [id number] Yes Take 1 capsule (100 mg total) by mouth every 8 (eight) hours as needed for cough. Nonda Bays, FNP Taking Active   budesonide  (PULMICORT ) 0.25 MG/2ML nebulizer solution [id number] Yes Take 2 mLs (0.25 mg  total) by nebulization in the morning and at bedtime. Dx code: J70.4 Desai, Nikita S, MD Taking Active   buPROPion  (WELLBUTRIN  XL) 300 MG 24 hr tablet [id number] Yes TAKE 1 TABLET(300 MG) BY MOUTH DAILY Caudle, Arcola Kocher, FNP Taking Active   estradiol  (ESTRACE ) 0.1 MG/GM vaginal cream [id number] Yes USE A PEA SIZED AMOUNT ON YOUR INDEX FINGER AND APPLY TO VAGINAL OPENING 2 NIGHTS A Ailene Alexandria, MD Taking Active   ferrous sulfate  325 (65 FE) MG tablet [id number] Yes Take 1 tablet (325 mg total) by mouth daily with breakfast. Macdonald Savoy, MD Taking Active   fluticasone  (FLONASE ) 50 MCG/ACT nasal spray [id number] Yes Place 2 sprays into both nostrils daily. Nonda Bays, FNP Taking Active   gabapentin  (NEURONTIN ) 100 MG capsule [id number] Yes Take 2 capsules (200 mg total) by mouth 2 (two) times daily. Izora Marten, PA-C Taking Active Self, Pharmacy Records           Med Note Burley Carpenter, Hershel Corkery   Tue Jul 26, 2023 11:02 AM) Patient taking 100 mg capsule one in am, one at bedtime  ipratropium (ATROVENT ) 0.02 % nebulizer solution [id number] Yes Take 2.5 mLs (0.5 mg total) by nebulization every 6 (six) hours as needed for wheezing or shortness of breath. Nonda Bays, FNP Taking Active   lipase/protease/amylase (CREON )  36000 UNITS CPEP capsule [id number] Yes Take 36,000 Units by mouth 4 (four) times daily as needed (Stomach Pain). [provider] Taking Active Self, Pharmacy Records  loperamide  (IMODIUM ) 2 MG capsule [id number] Yes Take 1 capsule (2 mg total) by mouth as needed for diarrhea or loose stools. Izora Marten, PA-C Taking Active Self, Pharmacy Records  montelukast  (SINGULAIR ) 10 MG tablet [id number] Yes Take 1 tablet (10 mg total) by mouth at bedtime. Adelia Homestead, MD Taking Active Self, Pharmacy Records  Multiple Vitamin (MULTI-VITAMIN) tablet [id number] Yes Take 1 tablet by mouth daily. [provider] Taking Active Self, Pharmacy Records   nystatin  cream (MYCOSTATIN ) [id number] Yes Apply 1 Application topically 2 (two) times daily. Nonda Bays, FNP Taking Active   ondansetron  (ZOFRAN ) 4 MG tablet [id number] Yes Take 1 tablet (4 mg total) by mouth every 8 (eight) hours as needed for nausea or vomiting. Rai, Hurman Maiden, MD Taking Active   pantoprazole  (PROTONIX ) 40 MG tablet [id number] Yes Take 1 tablet (40 mg total) by mouth 2 (two) times daily before a meal. Aleck Hurdle, MD Taking Active   PARoxetine  (PAXIL ) 20 MG tablet [id number] Yes Take 1 tablet (20 mg total) by mouth daily. Nonda Bays, FNP Taking Active   potassium chloride  SA (KLOR-CON  M) 20 MEQ tablet [id number] Yes Take 1 tablet (20 mEq total) by mouth as needed (Take on tablet once a day as needed witht he aditional dose of torsemide .). Arleen Lacer, MD Taking Active   torsemide  (DEMADEX ) 10 MG tablet [id number] Yes Take 1 tablet (10 mg total) by mouth daily. Take an additional 10 mg once a day for Lower extremity swelling, SOB, weight gain of 3 lbs overnight and 5 lbs over a weeks. Arleen Lacer, MD Taking Active   traMADol  (ULTRAM ) 50 MG tablet [id number]  Take 1 tablet (50 mg total) by mouth every 6 (six) hours as needed for severe pain (pain score 7-10). Nonda Bays, FNP  Active   traZODone  (DESYREL ) 50 MG tablet [id number]  Take 1 tablet (50 mg total) by mouth at bedtime as needed for sleep. Adelia Homestead, MD  Active Self, Pharmacy Records  VITAMIN D  PO [id number]  Take 1 capsule by mouth daily. [provider]  Active Self, Pharmacy Records            Recommendation:   PCP Follow-up  Patient was instructed to take extra tablet of torsemide  as per provider instructions in [facility] due to increase in weight from 253 on 08/26/23 to 257 on 09/01/23  Patient also reports increase in BP 143/81 taken today.  Patient states she has noticed swelling in her abdomen, which is a past indicator of fluid volume increase. Denies lower  extremity swelling, but says has some mild SOB, using nebulizer and resting as needed.Encouraged patient to call clinic if there is no improvement tomorrow.  Follow Up Plan:   Closing From:  Complex Care Management Patient has scheduled appointment with [facility] for new PCP, but states she has decided to go to [facility] instead.  Disenrolled from CCM program at this time.     Clarnce Crow BSN RN CCM [place]  [facility], [facility] Health RN Care Manager Direct Dial: [phone] Fax: [phone]

## 2023-08-31 NOTE — Patient Instructions (Signed)
 Visit Information  Thank you for taking time to visit with me today. Please don't hesitate to contact me if I can be of assistance to you before our next scheduled appointment.  Our next appointment is no further scheduled appointments.  on  at  Please call the care guide team at [phone] if you need to cancel or reschedule your appointment.   Following is a copy of your care plan:   Goals Addressed             This Visit's Progress    VBCI RN Care Plan   On track    Problems:  Chronic Disease Management support and education needs related to CHF and DMII Difficulty obtaining medications Financial Constraints. Transportation barriers  Goal: Over the next 2 weeks the Patient will attend all scheduled medical appointments:   as evidenced by chart review and patient report        continue to work with Medical illustrator and/or Social Worker to address care management and care coordination needs related to CHF and DMII as evidenced by adherence to CM Team Scheduled appointments     demonstrate Improved health management independence as evidenced by tracking daily weights, and regular blood sugar monitoring        demonstrate ongoing self health care management ability   as evidenced by    attending all appointments as scheduled, medication adherence.  Interventions:   Heart Failure Interventions:  (Status:  Goal on track:  Yes.) Short Term Goal Basic overview and discussion of pathophysiology of Heart Failure reviewed Provided education on low sodium diet Assessed need for readable accurate scales in home Provided education about placing scale on hard, flat surface Advised patient to weigh each morning after emptying bladder Discussed importance of daily weight and advised patient to weigh and record daily Reviewed role of diuretics in prevention of fluid overload and management of heart failure; Discussed the importance of keeping all appointments with provider Provided patient  with education about the role of exercise in the management of heart failure Referral made to community resources care guide team for assistance with transportation barriers; Advised patient to discuss referral to Internal Medicine PCP with provider Assessed social determinant of health barriers   Patient Self-Care Activities:  Attend all scheduled provider appointments Attend church or other social activities Call pharmacy for medication refills 3-7 days in advance of running out of medications Call provider office for new concerns or questions  Perform all self care activities independently  Take medications as prescribed   Work with the social worker to address care coordination needs and will continue to work with the clinical team to address health care and disease management related needs Work with the pharmacist to address medication management needs and will continue to work with the clinical team to address health care and disease management related needs  Plan:  Next PCP appointment scheduled for:  The patient has been provided with contact information for the care management team and has been advised to call with any health related questions or concerns.  Patient will take extra dose of torsemide  10 mg as instructed due to recent weight gain of 4 lbs since 08/26/23, and slightly elevated BP of 143/81. Patient will report to PCP if there is no reduction in weight over the next few days.             Please call the Suicide and Crisis Lifeline: 988 call the [place]  National Suicide Prevention Lifeline: 314-705-9905 or TTY:  970-713-5320 TTY (786) 050-8652) to talk to a trained counselor call 1-800-273-TALK (toll free, 24 hour hotline) if you are experiencing a Mental Health or Behavioral Health Crisis or need someone to talk to.  The patient verbalized understanding of instructions, educational materials, and care plan provided today and agreed to receive a mailed copy of patient  instructions, educational materials, and care plan.    Clarnce Crow BSN RN CCM [place]  [facility], [facility] Health RN Care Manager Direct Dial: [phone] Fax: [phone]

## 2023-08-31 NOTE — Telephone Encounter (Signed)
Ok will send

## 2023-09-01 ENCOUNTER — Other Ambulatory Visit: Payer: Self-pay | Admitting: Family Medicine

## 2023-09-01 DIAGNOSIS — M549 Dorsalgia, unspecified: Secondary | ICD-10-CM | POA: Diagnosis not present

## 2023-09-01 DIAGNOSIS — M5416 Radiculopathy, lumbar region: Secondary | ICD-10-CM | POA: Diagnosis not present

## 2023-09-01 DIAGNOSIS — I493 Ventricular premature depolarization: Secondary | ICD-10-CM | POA: Diagnosis not present

## 2023-09-01 DIAGNOSIS — E1142 Type 2 diabetes mellitus with diabetic polyneuropathy: Secondary | ICD-10-CM | POA: Diagnosis not present

## 2023-09-01 DIAGNOSIS — I509 Heart failure, unspecified: Secondary | ICD-10-CM | POA: Diagnosis not present

## 2023-09-01 DIAGNOSIS — R1033 Periumbilical pain: Secondary | ICD-10-CM | POA: Diagnosis not present

## 2023-09-01 DIAGNOSIS — Q6100 Congenital renal cyst, unspecified: Secondary | ICD-10-CM | POA: Diagnosis not present

## 2023-09-01 DIAGNOSIS — K219 Gastro-esophageal reflux disease without esophagitis: Secondary | ICD-10-CM | POA: Diagnosis not present

## 2023-09-01 DIAGNOSIS — D509 Iron deficiency anemia, unspecified: Secondary | ICD-10-CM | POA: Diagnosis not present

## 2023-09-01 DIAGNOSIS — G8929 Other chronic pain: Secondary | ICD-10-CM | POA: Diagnosis not present

## 2023-09-01 DIAGNOSIS — E559 Vitamin D deficiency, unspecified: Secondary | ICD-10-CM | POA: Diagnosis not present

## 2023-09-01 DIAGNOSIS — N182 Chronic kidney disease, stage 2 (mild): Secondary | ICD-10-CM | POA: Diagnosis not present

## 2023-09-01 DIAGNOSIS — D631 Anemia in chronic kidney disease: Secondary | ICD-10-CM | POA: Diagnosis not present

## 2023-09-01 DIAGNOSIS — M1990 Unspecified osteoarthritis, unspecified site: Secondary | ICD-10-CM | POA: Diagnosis not present

## 2023-09-01 DIAGNOSIS — G4733 Obstructive sleep apnea (adult) (pediatric): Secondary | ICD-10-CM | POA: Diagnosis not present

## 2023-09-01 DIAGNOSIS — M797 Fibromyalgia: Secondary | ICD-10-CM | POA: Diagnosis not present

## 2023-09-01 DIAGNOSIS — E1122 Type 2 diabetes mellitus with diabetic chronic kidney disease: Secondary | ICD-10-CM | POA: Diagnosis not present

## 2023-09-01 DIAGNOSIS — I491 Atrial premature depolarization: Secondary | ICD-10-CM | POA: Diagnosis not present

## 2023-09-01 DIAGNOSIS — I951 Orthostatic hypotension: Secondary | ICD-10-CM | POA: Diagnosis not present

## 2023-09-01 DIAGNOSIS — M5010 Cervical disc disorder with radiculopathy, unspecified cervical region: Secondary | ICD-10-CM | POA: Diagnosis not present

## 2023-09-01 DIAGNOSIS — J45909 Unspecified asthma, uncomplicated: Secondary | ICD-10-CM | POA: Diagnosis not present

## 2023-09-01 DIAGNOSIS — M439 Deforming dorsopathy, unspecified: Secondary | ICD-10-CM | POA: Diagnosis not present

## 2023-09-01 DIAGNOSIS — E611 Iron deficiency: Secondary | ICD-10-CM | POA: Diagnosis not present

## 2023-09-01 DIAGNOSIS — R1310 Dysphagia, unspecified: Secondary | ICD-10-CM | POA: Diagnosis not present

## 2023-09-01 DIAGNOSIS — I130 Hypertensive heart and chronic kidney disease with heart failure and stage 1 through stage 4 chronic kidney disease, or unspecified chronic kidney disease: Secondary | ICD-10-CM | POA: Diagnosis not present

## 2023-09-01 DIAGNOSIS — K8681 Exocrine pancreatic insufficiency: Secondary | ICD-10-CM | POA: Diagnosis not present

## 2023-09-01 DIAGNOSIS — G4700 Insomnia, unspecified: Secondary | ICD-10-CM | POA: Diagnosis not present

## 2023-09-01 DIAGNOSIS — M4800 Spinal stenosis, site unspecified: Secondary | ICD-10-CM | POA: Diagnosis not present

## 2023-09-02 ENCOUNTER — Ambulatory Visit: Payer: Self-pay | Admitting: *Deleted

## 2023-09-02 ENCOUNTER — Telehealth: Payer: Self-pay

## 2023-09-02 NOTE — Patient Outreach (Signed)
 RNCM Telephone Encounter Note:  Called patient to check in re: recent weigh gain and elevated BP.  Todays weight is 257 which is a two lb increase.  BP is 146/84.  Patient states also having some SOB, found some relief after using nebulizer this morning, but not completely resolved.  Called PCP office, they were closed for the day.  Was transferred to [facility] Triage RN Angie.  Relayed patient recent vital signs, gave my contact information.  They will call patient to obtain more information and provide patient with additional instructions as needed.   Clarnce Crow BSN RN CCM [place]  [facility], [facility] RN Care Manager Direct Dial: [phone] Fax: [phone]

## 2023-09-02 NOTE — Telephone Encounter (Signed)
 Call received by Jessica Gram, RN from [facility] regarding sx of SOB, weight gain and elevated BP 146/84 yesterday , not with patient or on phone with patient at this time.  NT called patient to review sx.   Chief Complaint: SOB same , weight gain noted, patient reports she is shopping at this time and feels "ok". Does have headaches at times since last week. Symptoms: SOB with exertion, weight gain from 255 lbs on 08/27/23 to 259 lbs on 09/02/23. Patient out shopping and denies SOB now at rest. Unable to check BP . Frequency: today  Pertinent Negatives: Patient denies chest pain no difficulty breathing no SOB at rest, no wheezing, no leg swelling . No blurred vision no slurred speech no headache now .  Disposition: [] ED /[] Urgent Care (no appt availability in office) / [] Appointment(In office/virtual)/ []  [place] [facility]/ [] Home Care/ [] Refused Recommended Disposition /[] [place] [facility]/ [x]  Follow-up with PCP Additional Notes:   Patient in transition of changing providers and new patient appt 09/07/23 at [facility]. Previous office closed today at noon,Sports med at [place].  On call provider called to review if medication changes need to be addressed. Recommended patient get evaluated and  if sx  worsen patient needs to go to ED. Due to chronic condition patient reports she wants to wait to see new provider unless sx worsen.      Copied from CRM [id number]. Topic: Clinical - Red Word Triage >> Sep 02, 2023  3:19 PM [name] wrote: Red Word that prompted transfer to Nurse Triage: [name] with [facility] nurse case manager for [facility] practice calling with concerning details about the patient. Water weight gain, elevated BP, increased shortness of breath. Reason for Disposition  [1] MODERATE longstanding difficulty breathing (e.g., speaks in phrases, SOB even at rest, pulse 100-120) AND [2] SAME as normal  Answer Assessment - Initial Assessment Questions 1. RESPIRATORY STATUS:  "Describe your breathing?" (e.g., wheezing, shortness of breath, unable to speak, severe coughing)      Worsening shortness of breath per Jessica Gram, RN  patient called and reports SOB is same as always. 2. ONSET: "When did this breathing problem begin?"      Today / patient reports on going and is out shopping now 3. PATTERN "Does the difficult breathing come and go, or has it been constant since it started?"      SOB with exertion not at rest 4. SEVERITY: "How bad is your breathing?" (e.g., mild, moderate, severe)    - MILD: No SOB at rest, mild SOB with walking, speaks normally in sentences, can lie down, no retractions, pulse < 100.    - MODERATE: SOB at rest, SOB with minimal exertion and prefers to sit, cannot lie down flat, speaks in phrases, mild retractions, audible wheezing, pulse 100-120.    - SEVERE: Very SOB at rest, speaks in single words, struggling to breathe, sitting hunched forward, retractions, pulse > 120      RN reports patient with weight gain, elevated BP and SOB patient reports no SOB at rest on with exertion 5. RECURRENT SYMPTOM: "Have you had difficulty breathing before?" If Yes, ask: "When was the last time?" and "What happened that time?"      Yes  6. CARDIAC HISTORY: "Do you have any history of heart disease?" (e.g., heart attack, angina, bypass surgery, angioplasty)      See hx  7. LUNG HISTORY: "Do you have any history of lung disease?"  (e.g., pulmonary embolus, asthma,  emphysema)     See hx  8. CAUSE: "What do you think is causing the breathing problem?"      Heart issues  9. OTHER SYMPTOMS: "Do you have any other symptoms? (e.g., dizziness, runny nose, cough, chest pain, fever)     SOB, weight gain 255- 259 lbs elevated BP yesterday 146/84/ patient reports SOB with exertion and weight gain has been increasing since 08/27/23 255 lbs- 259 today  10. O2 SATURATION MONITOR:  "Do you use an oxygen saturation monitor (pulse oximeter) at home?" If Yes, ask: "What is your  reading (oxygen level) today?" "What is your usual oxygen saturation reading?" (e.g., 95%)       na 11. PREGNANCY: "Is there any chance you are pregnant?" "When was your last menstrual period?"       na 12. TRAVEL: "Have you traveled out of the country in the last month?" (e.g., travel history, exposures)       na  Protocols used: Breathing Difficulty-A-AH

## 2023-09-06 ENCOUNTER — Telehealth: Payer: Self-pay | Admitting: *Deleted

## 2023-09-06 DIAGNOSIS — J4540 Moderate persistent asthma, uncomplicated: Secondary | ICD-10-CM

## 2023-09-06 MED ORDER — BUDESONIDE 0.25 MG/2ML IN SUSP: 0.2500 mg | Freq: Two times a day (BID) | RESPIRATORY_TRACT | 11 refills | Status: AC

## 2023-09-06 NOTE — Telephone Encounter (Signed)
 Copied from CRM [id number]. Topic: Clinical - Prescription Issue >> Sep 01, 2023  2:25 PM Justina Oman C wrote: Reason for CRM: Shelley Dew from [place] [facility] [phone] option 5 states received budesonide  (PULMICORT ) 0.25 MG/2ML nebulizer solution prescription however there's a note, "verbal with Read Back: Cosign Required" and Medicare B will not cover verbal orders. Shelley Dew states they need clarification. Please advise and call back.  Called [facility]

## 2023-09-07 ENCOUNTER — Ambulatory Visit: Payer: Self-pay

## 2023-09-07 ENCOUNTER — Encounter: Payer: Self-pay | Admitting: Sports Medicine

## 2023-09-07 ENCOUNTER — Ambulatory Visit: Admitting: Adult Health

## 2023-09-07 DIAGNOSIS — E611 Iron deficiency: Secondary | ICD-10-CM | POA: Diagnosis not present

## 2023-09-07 DIAGNOSIS — I493 Ventricular premature depolarization: Secondary | ICD-10-CM | POA: Diagnosis not present

## 2023-09-07 DIAGNOSIS — M549 Dorsalgia, unspecified: Secondary | ICD-10-CM | POA: Diagnosis not present

## 2023-09-07 DIAGNOSIS — J45909 Unspecified asthma, uncomplicated: Secondary | ICD-10-CM | POA: Diagnosis not present

## 2023-09-07 DIAGNOSIS — E559 Vitamin D deficiency, unspecified: Secondary | ICD-10-CM | POA: Diagnosis not present

## 2023-09-07 DIAGNOSIS — D631 Anemia in chronic kidney disease: Secondary | ICD-10-CM | POA: Diagnosis not present

## 2023-09-07 DIAGNOSIS — G4733 Obstructive sleep apnea (adult) (pediatric): Secondary | ICD-10-CM | POA: Diagnosis not present

## 2023-09-07 DIAGNOSIS — I951 Orthostatic hypotension: Secondary | ICD-10-CM | POA: Diagnosis not present

## 2023-09-07 DIAGNOSIS — M5010 Cervical disc disorder with radiculopathy, unspecified cervical region: Secondary | ICD-10-CM | POA: Diagnosis not present

## 2023-09-07 DIAGNOSIS — G4700 Insomnia, unspecified: Secondary | ICD-10-CM | POA: Diagnosis not present

## 2023-09-07 DIAGNOSIS — G8929 Other chronic pain: Secondary | ICD-10-CM | POA: Diagnosis not present

## 2023-09-07 DIAGNOSIS — Q6100 Congenital renal cyst, unspecified: Secondary | ICD-10-CM | POA: Diagnosis not present

## 2023-09-07 DIAGNOSIS — M4800 Spinal stenosis, site unspecified: Secondary | ICD-10-CM | POA: Diagnosis not present

## 2023-09-07 DIAGNOSIS — M439 Deforming dorsopathy, unspecified: Secondary | ICD-10-CM | POA: Diagnosis not present

## 2023-09-07 DIAGNOSIS — I491 Atrial premature depolarization: Secondary | ICD-10-CM | POA: Diagnosis not present

## 2023-09-07 DIAGNOSIS — M1990 Unspecified osteoarthritis, unspecified site: Secondary | ICD-10-CM | POA: Diagnosis not present

## 2023-09-07 DIAGNOSIS — M797 Fibromyalgia: Secondary | ICD-10-CM | POA: Diagnosis not present

## 2023-09-07 DIAGNOSIS — I509 Heart failure, unspecified: Secondary | ICD-10-CM | POA: Diagnosis not present

## 2023-09-07 DIAGNOSIS — M5416 Radiculopathy, lumbar region: Secondary | ICD-10-CM | POA: Diagnosis not present

## 2023-09-07 DIAGNOSIS — I130 Hypertensive heart and chronic kidney disease with heart failure and stage 1 through stage 4 chronic kidney disease, or unspecified chronic kidney disease: Secondary | ICD-10-CM | POA: Diagnosis not present

## 2023-09-07 DIAGNOSIS — N182 Chronic kidney disease, stage 2 (mild): Secondary | ICD-10-CM | POA: Diagnosis not present

## 2023-09-07 DIAGNOSIS — E1122 Type 2 diabetes mellitus with diabetic chronic kidney disease: Secondary | ICD-10-CM | POA: Diagnosis not present

## 2023-09-07 DIAGNOSIS — E1142 Type 2 diabetes mellitus with diabetic polyneuropathy: Secondary | ICD-10-CM | POA: Diagnosis not present

## 2023-09-07 NOTE — Telephone Encounter (Signed)
 Copied from CRM [id number]. Topic: Clinical - Red Word Triage >> Sep 07, 2023 10:13 AM Danelle Dunning F wrote: [facility] that prompted transfer to Nurse Triage:   Possible UTI; Burning sensation when urinating  Chief Complaint: Burning while urinating Symptoms: Urgency, flank pain Frequency: Since Sunday/Monday Pertinent Negatives: Patient denies fever Disposition: [] ED /[] Urgent Care (no appt availability in office) / [x] Appointment(In office/virtual)/ []  [place] [facility]/ [] Home Care/ [] Refused Recommended Disposition /[] [place] [facility]/ []  Follow-up with PCP Additional Notes: Patient was originally supposed to have a NP appointment today. Patient stated the office called her to reschedule because the provider was going to be out of the office. Patient is also experiencing urinary symptoms. Patient reported burning while urinating, urinary urgency and left flank pain. Patient denied fever and hematuria. Advised patient to be seen within 24 hours, per protocol. Rescheduled NP appointment for today with alternate provider in office. Provided care advice and instructed patient to call back if symptoms worsen. Patient complied.   Reason for Disposition  Side (flank) or lower back pain present  Answer Assessment - Initial Assessment Questions 1. SYMPTOM: "What's the main symptom you're concerned about?" (e.g., frequency, incontinence)     Burning during urination 2. ONSET: "When did the symptoms start?"     Sunday-Monday 3. PAIN: "Is there any pain?" If Yes, ask: "How bad is it?" (Scale: 1-10; mild, moderate, severe)     Middle lower back pain, more so left side 4. CAUSE: "What do you think is causing the symptoms?"     UTI, states she has a history of having UTIs off and on 5. OTHER SYMPTOMS: "Do you have any other symptoms?" (e.g., blood in urine, fever, flank pain, pain with urination)     Urinary urgency, denies hematuria, denies fever  Protocols used: Urinary Symptoms-A-AH

## 2023-09-07 NOTE — Telephone Encounter (Signed)
 Noted

## 2023-09-08 NOTE — Progress Notes (Signed)
 This encounter was created in error - please disregard.

## 2023-09-09 DIAGNOSIS — J4540 Moderate persistent asthma, uncomplicated: Secondary | ICD-10-CM | POA: Diagnosis not present

## 2023-09-14 ENCOUNTER — Telehealth: Payer: Self-pay | Admitting: Urology

## 2023-09-14 DIAGNOSIS — E611 Iron deficiency: Secondary | ICD-10-CM | POA: Diagnosis not present

## 2023-09-14 DIAGNOSIS — N182 Chronic kidney disease, stage 2 (mild): Secondary | ICD-10-CM | POA: Diagnosis not present

## 2023-09-14 DIAGNOSIS — I493 Ventricular premature depolarization: Secondary | ICD-10-CM | POA: Diagnosis not present

## 2023-09-14 DIAGNOSIS — E559 Vitamin D deficiency, unspecified: Secondary | ICD-10-CM | POA: Diagnosis not present

## 2023-09-14 DIAGNOSIS — I951 Orthostatic hypotension: Secondary | ICD-10-CM | POA: Diagnosis not present

## 2023-09-14 DIAGNOSIS — M439 Deforming dorsopathy, unspecified: Secondary | ICD-10-CM | POA: Diagnosis not present

## 2023-09-14 DIAGNOSIS — I509 Heart failure, unspecified: Secondary | ICD-10-CM | POA: Diagnosis not present

## 2023-09-14 DIAGNOSIS — M797 Fibromyalgia: Secondary | ICD-10-CM | POA: Diagnosis not present

## 2023-09-14 DIAGNOSIS — J45909 Unspecified asthma, uncomplicated: Secondary | ICD-10-CM | POA: Diagnosis not present

## 2023-09-14 DIAGNOSIS — M5416 Radiculopathy, lumbar region: Secondary | ICD-10-CM | POA: Diagnosis not present

## 2023-09-14 DIAGNOSIS — E1142 Type 2 diabetes mellitus with diabetic polyneuropathy: Secondary | ICD-10-CM | POA: Diagnosis not present

## 2023-09-14 DIAGNOSIS — M4800 Spinal stenosis, site unspecified: Secondary | ICD-10-CM | POA: Diagnosis not present

## 2023-09-14 DIAGNOSIS — G4733 Obstructive sleep apnea (adult) (pediatric): Secondary | ICD-10-CM | POA: Diagnosis not present

## 2023-09-14 DIAGNOSIS — M5010 Cervical disc disorder with radiculopathy, unspecified cervical region: Secondary | ICD-10-CM | POA: Diagnosis not present

## 2023-09-14 DIAGNOSIS — M549 Dorsalgia, unspecified: Secondary | ICD-10-CM | POA: Diagnosis not present

## 2023-09-14 DIAGNOSIS — D631 Anemia in chronic kidney disease: Secondary | ICD-10-CM | POA: Diagnosis not present

## 2023-09-14 DIAGNOSIS — G8929 Other chronic pain: Secondary | ICD-10-CM | POA: Diagnosis not present

## 2023-09-14 DIAGNOSIS — M1990 Unspecified osteoarthritis, unspecified site: Secondary | ICD-10-CM | POA: Diagnosis not present

## 2023-09-14 DIAGNOSIS — Q6100 Congenital renal cyst, unspecified: Secondary | ICD-10-CM | POA: Diagnosis not present

## 2023-09-14 DIAGNOSIS — I130 Hypertensive heart and chronic kidney disease with heart failure and stage 1 through stage 4 chronic kidney disease, or unspecified chronic kidney disease: Secondary | ICD-10-CM | POA: Diagnosis not present

## 2023-09-14 DIAGNOSIS — G4700 Insomnia, unspecified: Secondary | ICD-10-CM | POA: Diagnosis not present

## 2023-09-14 DIAGNOSIS — E1122 Type 2 diabetes mellitus with diabetic chronic kidney disease: Secondary | ICD-10-CM | POA: Diagnosis not present

## 2023-09-14 DIAGNOSIS — I491 Atrial premature depolarization: Secondary | ICD-10-CM | POA: Diagnosis not present

## 2023-09-14 NOTE — Telephone Encounter (Signed)
 One of pt caregivers  named April at [facility]  phone number is [phone].Called to see if she could get verbal orders for a UA and cns for [name] she stated she has been having uti symptoms since Thursday of last week with Dysuria. Please Advise.

## 2023-09-15 NOTE — Telephone Encounter (Signed)
 LMOM for April. Requested a return call.

## 2023-09-15 NOTE — Telephone Encounter (Signed)
 LMOM for April. Made aware will need a cath specimen for u/a and cx from pt. Also, if they dont use [facility] will need results sent to us . Requested a return call with what she is able to do.

## 2023-09-15 NOTE — Telephone Encounter (Signed)
 [name] from [place] [facility] called back and stated if she did not anwser that you could leave a detailed msg since her vm is private.

## 2023-09-16 DIAGNOSIS — M5416 Radiculopathy, lumbar region: Secondary | ICD-10-CM | POA: Diagnosis not present

## 2023-09-16 DIAGNOSIS — N182 Chronic kidney disease, stage 2 (mild): Secondary | ICD-10-CM | POA: Diagnosis not present

## 2023-09-16 DIAGNOSIS — M549 Dorsalgia, unspecified: Secondary | ICD-10-CM | POA: Diagnosis not present

## 2023-09-16 DIAGNOSIS — M439 Deforming dorsopathy, unspecified: Secondary | ICD-10-CM | POA: Diagnosis not present

## 2023-09-16 DIAGNOSIS — E559 Vitamin D deficiency, unspecified: Secondary | ICD-10-CM | POA: Diagnosis not present

## 2023-09-16 DIAGNOSIS — J45909 Unspecified asthma, uncomplicated: Secondary | ICD-10-CM | POA: Diagnosis not present

## 2023-09-16 DIAGNOSIS — E611 Iron deficiency: Secondary | ICD-10-CM | POA: Diagnosis not present

## 2023-09-16 DIAGNOSIS — I509 Heart failure, unspecified: Secondary | ICD-10-CM | POA: Diagnosis not present

## 2023-09-16 DIAGNOSIS — M797 Fibromyalgia: Secondary | ICD-10-CM | POA: Diagnosis not present

## 2023-09-16 DIAGNOSIS — E1142 Type 2 diabetes mellitus with diabetic polyneuropathy: Secondary | ICD-10-CM | POA: Diagnosis not present

## 2023-09-16 DIAGNOSIS — M5010 Cervical disc disorder with radiculopathy, unspecified cervical region: Secondary | ICD-10-CM | POA: Diagnosis not present

## 2023-09-16 DIAGNOSIS — G4733 Obstructive sleep apnea (adult) (pediatric): Secondary | ICD-10-CM | POA: Diagnosis not present

## 2023-09-16 DIAGNOSIS — Q6100 Congenital renal cyst, unspecified: Secondary | ICD-10-CM | POA: Diagnosis not present

## 2023-09-16 DIAGNOSIS — E1122 Type 2 diabetes mellitus with diabetic chronic kidney disease: Secondary | ICD-10-CM | POA: Diagnosis not present

## 2023-09-16 DIAGNOSIS — G8929 Other chronic pain: Secondary | ICD-10-CM | POA: Diagnosis not present

## 2023-09-16 DIAGNOSIS — I491 Atrial premature depolarization: Secondary | ICD-10-CM | POA: Diagnosis not present

## 2023-09-16 DIAGNOSIS — G4700 Insomnia, unspecified: Secondary | ICD-10-CM | POA: Diagnosis not present

## 2023-09-16 DIAGNOSIS — I951 Orthostatic hypotension: Secondary | ICD-10-CM | POA: Diagnosis not present

## 2023-09-16 DIAGNOSIS — D631 Anemia in chronic kidney disease: Secondary | ICD-10-CM | POA: Diagnosis not present

## 2023-09-16 DIAGNOSIS — I130 Hypertensive heart and chronic kidney disease with heart failure and stage 1 through stage 4 chronic kidney disease, or unspecified chronic kidney disease: Secondary | ICD-10-CM | POA: Diagnosis not present

## 2023-09-16 DIAGNOSIS — R350 Frequency of micturition: Secondary | ICD-10-CM | POA: Diagnosis not present

## 2023-09-16 DIAGNOSIS — M4800 Spinal stenosis, site unspecified: Secondary | ICD-10-CM | POA: Diagnosis not present

## 2023-09-16 DIAGNOSIS — I493 Ventricular premature depolarization: Secondary | ICD-10-CM | POA: Diagnosis not present

## 2023-09-16 DIAGNOSIS — M1990 Unspecified osteoarthritis, unspecified site: Secondary | ICD-10-CM | POA: Diagnosis not present

## 2023-09-19 NOTE — Telephone Encounter (Signed)
 [facility] for April requesting a return call.

## 2023-09-20 ENCOUNTER — Ambulatory Visit: Payer: Self-pay

## 2023-09-20 DIAGNOSIS — M5416 Radiculopathy, lumbar region: Secondary | ICD-10-CM | POA: Diagnosis not present

## 2023-09-20 DIAGNOSIS — M1990 Unspecified osteoarthritis, unspecified site: Secondary | ICD-10-CM | POA: Diagnosis not present

## 2023-09-20 DIAGNOSIS — N182 Chronic kidney disease, stage 2 (mild): Secondary | ICD-10-CM | POA: Diagnosis not present

## 2023-09-20 DIAGNOSIS — M4800 Spinal stenosis, site unspecified: Secondary | ICD-10-CM | POA: Diagnosis not present

## 2023-09-20 DIAGNOSIS — E1122 Type 2 diabetes mellitus with diabetic chronic kidney disease: Secondary | ICD-10-CM | POA: Diagnosis not present

## 2023-09-20 DIAGNOSIS — G4733 Obstructive sleep apnea (adult) (pediatric): Secondary | ICD-10-CM | POA: Diagnosis not present

## 2023-09-20 DIAGNOSIS — E611 Iron deficiency: Secondary | ICD-10-CM | POA: Diagnosis not present

## 2023-09-20 DIAGNOSIS — M439 Deforming dorsopathy, unspecified: Secondary | ICD-10-CM | POA: Diagnosis not present

## 2023-09-20 DIAGNOSIS — I491 Atrial premature depolarization: Secondary | ICD-10-CM | POA: Diagnosis not present

## 2023-09-20 DIAGNOSIS — I493 Ventricular premature depolarization: Secondary | ICD-10-CM | POA: Diagnosis not present

## 2023-09-20 DIAGNOSIS — M797 Fibromyalgia: Secondary | ICD-10-CM | POA: Diagnosis not present

## 2023-09-20 DIAGNOSIS — I951 Orthostatic hypotension: Secondary | ICD-10-CM | POA: Diagnosis not present

## 2023-09-20 DIAGNOSIS — Q6100 Congenital renal cyst, unspecified: Secondary | ICD-10-CM | POA: Diagnosis not present

## 2023-09-20 DIAGNOSIS — D631 Anemia in chronic kidney disease: Secondary | ICD-10-CM | POA: Diagnosis not present

## 2023-09-20 DIAGNOSIS — I130 Hypertensive heart and chronic kidney disease with heart failure and stage 1 through stage 4 chronic kidney disease, or unspecified chronic kidney disease: Secondary | ICD-10-CM | POA: Diagnosis not present

## 2023-09-20 DIAGNOSIS — I509 Heart failure, unspecified: Secondary | ICD-10-CM | POA: Diagnosis not present

## 2023-09-20 DIAGNOSIS — M549 Dorsalgia, unspecified: Secondary | ICD-10-CM | POA: Diagnosis not present

## 2023-09-20 DIAGNOSIS — G4700 Insomnia, unspecified: Secondary | ICD-10-CM | POA: Diagnosis not present

## 2023-09-20 DIAGNOSIS — E559 Vitamin D deficiency, unspecified: Secondary | ICD-10-CM | POA: Diagnosis not present

## 2023-09-20 DIAGNOSIS — J45909 Unspecified asthma, uncomplicated: Secondary | ICD-10-CM | POA: Diagnosis not present

## 2023-09-20 DIAGNOSIS — M5010 Cervical disc disorder with radiculopathy, unspecified cervical region: Secondary | ICD-10-CM | POA: Diagnosis not present

## 2023-09-20 DIAGNOSIS — E1142 Type 2 diabetes mellitus with diabetic polyneuropathy: Secondary | ICD-10-CM | POA: Diagnosis not present

## 2023-09-20 DIAGNOSIS — G8929 Other chronic pain: Secondary | ICD-10-CM | POA: Diagnosis not present

## 2023-09-20 NOTE — Telephone Encounter (Signed)
 Copied from CRM [id number]. Topic: Clinical - Red Word Triage >> Sep 20, 2023 11:13 AM Ala Alice wrote: Red Word that prompted transfer to Nurse Triage: April Nurse with [facility] states that she is with the patient. Patient is overall not feeling well: fatigue, diarrhea, no appetite.  Chief Complaint: diarrhea Symptoms: fatigued, diarrhea, low apetitie, n/v and abd pain Frequency: x 3 to 5 days Pertinent Negatives: Patient denies fever Disposition: [] ED /[x] Urgent Care (no appt availability in office) / [] Appointment(In office/virtual)/ []  [place] [facility]/ [] Home Care/ [] Refused Recommended Disposition /[] [place] [facility]/ []  Follow-up with PCP Additional Notes: April with [facility]  Answer Assessment - Initial Assessment Questions 1. DIARRHEA SEVERITY: "How bad is the diarrhea?" "How many more stools have you had in the past 24 hours than normal?"    - NO DIARRHEA (SCALE 0)   - MILD (SCALE 1-3): Few loose or mushy BMs; increase of 1-3 stools over normal daily number of stools; mild increase in ostomy output.   -  MODERATE (SCALE 4-7): Increase of 4-6 stools daily over normal; moderate increase in ostomy output.   -  SEVERE (SCALE 8-10; OR "WORST POSSIBLE"): Increase of 7 or more stools daily over normal; moderate increase in ostomy output; incontinence.     severe 2. ONSET: "When did the diarrhea begin?"      X3 to 5 days  3. BM CONSISTENCY: "How loose or watery is the diarrhea?"      Watery, loose stools 4. VOMITING: "Are you also vomiting?" If Yes, ask: "How many times in the past 24 hours?"      unknown 5. ABDOMEN PAIN: "Are you having any abdomen pain?" If Yes, ask: "What does it feel like?" (e.g., crampy, dull, intermittent, constant)      Yes - middle right of belly button 6. ABDOMEN PAIN SEVERITY: If present, ask: "How bad is the pain?"  (e.g., Scale 1-10; mild, moderate, or severe)   - MILD (1-3): doesn't interfere with normal activities, abdomen  soft and not tender to touch    - MODERATE (4-7): interferes with normal activities or awakens from sleep, abdomen tender to touch    - SEVERE (8-10): excruciating pain, doubled over, unable to do any normal activities       5/10 7. ORAL INTAKE: If vomiting, "Have you been able to drink liquids?" "How much liquids have you had in the past 24 hours?"     Not much 8. HYDRATION: "Any signs of dehydration?" (e.g., dry mouth [not just dry lips], too weak to stand, dizziness, new weight loss) "When did you last urinate?"     Weak,  9. EXPOSURE: "Have you traveled to a foreign country recently?" "Have you been exposed to anyone with diarrhea?" "Could you have eaten any food that was spoiled?"     N/a 10. ANTIBIOTIC USE: "Are you taking antibiotics now or have you taken antibiotics in the past 2 months?"       no 11. OTHER SYMPTOMS: "Do you have any other symptoms?" (e.g., fever, blood in stool)       Some blood in stool x 1  12. PREGNANCY: "Is there any chance you are pregnant?" "When was your last menstrual period?"       N/a  Protocols used: [facility]

## 2023-09-21 ENCOUNTER — Encounter: Payer: Self-pay | Admitting: Urology

## 2023-09-21 NOTE — Progress Notes (Signed)
 Assessment: 1.  History of cystitis, although I think some of her symptoms were not associated with cystitis even though she was treated with antibiotics  2.  Urgency, urgency incontinence--OAB, persistent  3.  Vaginal atrophic changes secondary to age   Plan: 1.  I reassured Bynum [name] that she does not have a fistula  2.  I gave her Gemtesa samples to take 3 nights a week-Monday Wednesday and Friday.  Instructions written down for her  3.  I will have her come back in 6 weeks to recheck symptoms.   History of Present Illness:  First seen about 2 months ago for evaluation of recurrent urinary tract infections.  She has been  treated for 3 urinary tract infections within the past 6 months.  Her most recent urine culture from 1 month ago revealed mixed urogenital organisms.  Prior to that, on 29 January she grew Aerococcus, on January 5 her culture was negative.   The patient denies recent febrile UTIs.  Her symptoms typically include frequency, urgency, urgency incontinence.  In the past, she has been seen for this incontinence as well as UTIs by Dr. Arita Belch at [facility] urology.  She was last seen at [facility] urology in 2021.  At that time she was treated for urgency incontinence as well as recurrent urinary tract infections.  Her recent symptoms were treated with Cipro .  She developed C. difficile after that.  I believe that culture at the time of treatment was negative.  She still has urgency frequency and urgency incontinence.  She also states that at times she feels like urine is coming from her anus.  6.9.2025: Here today for follow-up.  She still feels like there is urine coming from her rectum occasionally.  Also, she does say that she has occasional pneumaturia.  No blood in her urine but she does have dysuria occasionally.  No recent cloudy urine.  Past Medical History:  Past Medical History:  Diagnosis Date   Allergy 2010   Anemia    takes Ferrous Sulfate  daily    Anxiety    takes Citalopram  daily   Arthritis    Asthma    2004-prior to gastric bypass and no problems since   CAP (community acquired pneumonia) 07/23/2021   CHF (congestive heart failure) (HCC)    takes Lasix  daily as needed   Chronic back pain    spondylolisthesis/stenosis/radiculopathy   CKD (chronic kidney disease) stage 2, GFR 60-89 ml/min    Complication of anesthesia yrs ago   slow to wake up   Depression    Diabetes (HCC)    DVT (deep venous thrombosis) (HCC) 01/17/2013   past hx. -tx.5-6 yrs ago bilateral legs, occ. sporadic swelling, has IVC filter implanted   Dysrhythmia    "heart tends to flutter"   Fibromyalgia    Fracture    right foot and is in a cam boot   Gallstones    GERD (gastroesophageal reflux disease)    hx of-no meds now   HCAP (healthcare-associated pneumonia) 02/19/2023   Heart murmur    History of bronchitis 2012 or 2013   History of colon polyps    Hypertension    takes Metoprolol  daily   Insomnia    takes Melatonin daily   Microalbuminuria    Pelvic floor dysfunction    Peripheral neuropathy    Pneumonia 90's   hx of   Recurrent Clostridioides difficile infection 02/28/2020   S/P gastric bypass 2003   Sleep apnea  no cpap used in many yrs after weight lost-no machine now   Urinary frequency    Urinary urgency    Vitamin D  deficiency     Past Surgical History:  Past Surgical History:  Procedure Laterality Date   BALLOON DILATION N/A 01/31/2013   Procedure: BALLOON DILATION;  Surgeon: Evangeline Hilts, MD;  Location: [facility];  Service: Endoscopy;  Laterality: N/A;   CARDIAC EVENT MONITOR  03/2019   Predominantly sinus rhythm.  Rates range from 48-124 bpm.  Average 74 bpm.  Frequent short bursts (3-15 beats) PAT/PSVT--not indicated as being symptomatic on diary.  Otherwise rare PACs and PVCs.   CARDIAC EVENT MONITOR  08/2021   14-Day Zio Patch: Predominantly sinus rhythm with rate range 45 to 135 bpm.  Average 73 bpm.  Occasional  (1.3%) PACs with rare PVCs and rare PAC couplets.  24 episodes of atrial runs: Fastest was 11 beats at a rate of 184 bpm, longest was 13 beats with a rate of 92 bpm.  No sustained tachyarrhythmias or bradycardia arrhythmias.  No atrial fibrillation or flutter.   CARDIOPULMONARY EXERCISE STRESS TEST (CPX)  10/27/2021   Good effort despite dyspnea and wheezing.  Modified Naughton protocol on treadmill.  Normal heart rate response.  No sustained arrhythmias.  Preexercise spirometry normal.  Low normal peak VO2. Low normal functional capacity with NO Clear Cardiopulmonary Limitation.  Overall limitation primarily due to obesity & deconditioning, along with a component of Diastolic Dysfunction   CHOLECYSTECTOMY  2007   COLONOSCOPY     CT CTA CORONARY W/CA SCORE W/CM &/OR WO/CM  09/21/2019   Coronary Calcium  Score 0.  Normal RCA dominant coronary anatomy.  CAD RADS 1-minimal nonobstructive CAD (0-24%).  Consider nonatherosclerotic cause for chest pain.  Consider preventative therapy and risk factor modification.   DILATION AND CURETTAGE OF UTERUS  yrs ago   ESOPHAGOGASTRODUODENOSCOPY (EGD) WITH PROPOFOL   03/01/2012   Procedure: ESOPHAGOGASTRODUODENOSCOPY (EGD) WITH PROPOFOL ;  Surgeon: Evangeline Hilts, MD;  Location: [facility];  Service: Endoscopy;  Laterality: N/A;   ESOPHAGOGASTRODUODENOSCOPY (EGD) WITH PROPOFOL  N/A 01/31/2013   Procedure: ESOPHAGOGASTRODUODENOSCOPY (EGD) WITH PROPOFOL ;  Surgeon: Evangeline Hilts, MD;  Location: [facility];  Service: Endoscopy;  Laterality: N/A;   ESOPHAGOGASTRODUODENOSCOPY (EGD) WITH PROPOFOL  N/A 09/02/2015   Procedure: ESOPHAGOGASTRODUODENOSCOPY (EGD) WITH PROPOFOL ;  Surgeon: Kenney Peacemaker, MD;  Location: [facility];  Service: Endoscopy;  Laterality: N/A;   FRACTURE SURGERY     GASTRIC BY-PASS  2004   HERNIA REPAIR  2005   INSERTION OF VENA CAVA FILTER  01/17/2013   inserted 2004- "abdomen"   LIGAMENT REPAIR Right 1987   Rt. knee scope   MAXIMUM ACCESS  (MAS)POSTERIOR LUMBAR INTERBODY FUSION (PLIF) 2 LEVEL N/A 06/07/2014   Procedure: L4-5 L5-S1 FOR MAXIMUM ACCESS (MAS) POSTERIOR LUMBAR INTERBODY FUSION ;  Surgeon: Manya Sells, MD;  Location: [facility];  Service: Neurosurgery;  Laterality: N/A;  L4-5 L5-S1 FOR MAXIMUM ACCESS (MAS) POSTERIOR LUMBAR INTERBODY FUSION    SPINE SURGERY  2017   TONSILLECTOMY     as child   TRANSTHORACIC ECHOCARDIOGRAM  12/2020   EF 55 to 60%.  No RWMA.  Mild LVH.  GR 1 DD-moderate LA dilation..  Mild MR..  Normal RV size/function.  Normal PAP.  Normal RAP. => Essentially stable from December 2020    Allergies:  Allergies  Allergen Reactions   Macrobid  [Nitrofurantoin ] Other (See Comments)    C.Diff Infection   Carafate  [Sucralfate ] Hives, Itching and Other (See Comments)    Angioedema  Sulfonamide Derivatives Rash    Syncope   Infed  [Iron Dextran ]     Family History:  Family History  Problem Relation Age of Onset   Diabetes Mother    Atrial fibrillation Mother    Hypertension Mother    Arthritis Mother    Heart disease Father        Unsure of details   Hypertension Father    Prostate cancer Father    Alzheimer's disease Father    Arthritis Father    Cancer Father    Kidney disease Brother    Cancer Brother    Healthy Sister    Diabetes Maternal Grandmother    Heart disease Maternal Grandmother    Vision loss Maternal Grandmother    Stroke Maternal Grandfather 50   Hypertension Maternal Grandfather    Hypertension Paternal Grandmother    Alzheimer's disease Paternal Grandmother    Colon cancer Neg Hx    Colon polyps Neg Hx    Stomach cancer Neg Hx    Esophageal cancer Neg Hx    Pancreatic cancer Neg Hx    Liver disease Neg Hx     Social History:  Social History   Tobacco Use   Smoking status: Former    Current packs/day: 0.00    Average packs/day: 0.5 packs/day for 10.0 years (5.0 ttl pk-yrs)    Types: Cigarettes    Start date: 04/20/1999    Quit date: 04/19/2009    Years  since quitting: 14.4   Smokeless tobacco: Never  Vaping Use   Vaping status: Never Used  Substance Use Topics   Alcohol  use: Not Currently    Comment: occ. social- wine-1 drink monthly   Drug use: No    Review of symptoms:  Constitutional:  Negative for unexplained weight loss, night sweats, fever, chills ENT:  Negative for nose bleeds, sinus pain, painful swallowing CV:  Negative for chest pain, shortness of breath, exercise intolerance, palpitations, loss of consciousness Resp:  Negative for cough, wheezing, shortness of breath GI:  Negative for nausea, vomiting, diarrhea, bloody stools GU:  Positives noted in HPI; otherwise negative for gross hematuria, dysuria, urinary incontinence Neuro:  Negative for seizures, poor balance, limb weakness, slurred speech Psych:  Negative for lack of energy, depression, anxiety Endocrine:  Negative for polydipsia, polyuria, symptoms of hypoglycemia (dizziness, hunger, sweating) Hematologic:  Negative for anemia, purpura, petechia, prolonged or excessive bleeding, use of anticoagulants  Allergic:  Negative for difficulty breathing or choking as a result of exposure to anything; no shellfish allergy; no allergic response (rash/itch) to materials, foods      Results:   I have reviewed prior patient's records  I have reviewed referring/prior physicians records--PCP and [facility]   I have reviewed prior urine cultures  I reviewed prior imaging studies-CT abdomen and pelvis with contrast in March of this year for diarrhea and left lower quadrant abdominal pain revealed small left renal cyst, simple in nature.  No other GU abnormalities.  Indication: Pneumaturia, question of urine from rectum  After informed consent and discussion of the procedure and its risks, pt was positioned and prepped in the standard fashion.  Cystoscopy was performed with a flexible cystoscope.   Findings:  Urethra: No lesions or stricture Ureteral orifices:  Orthotopic bilaterally, normal configuration Bladder: No urothelial lesions.  No evidence of fistula or foreign bodies

## 2023-09-26 ENCOUNTER — Ambulatory Visit (INDEPENDENT_AMBULATORY_CARE_PROVIDER_SITE_OTHER): Admitting: Urology

## 2023-09-26 VITALS — BP 148/83 | HR 101 | Ht 65.0 in | Wt 257.0 lb

## 2023-09-26 DIAGNOSIS — N3941 Urge incontinence: Secondary | ICD-10-CM | POA: Diagnosis not present

## 2023-09-26 DIAGNOSIS — R3989 Other symptoms and signs involving the genitourinary system: Secondary | ICD-10-CM

## 2023-09-26 DIAGNOSIS — N3020 Other chronic cystitis without hematuria: Secondary | ICD-10-CM

## 2023-09-26 DIAGNOSIS — N3281 Overactive bladder: Secondary | ICD-10-CM

## 2023-09-26 DIAGNOSIS — N952 Postmenopausal atrophic vaginitis: Secondary | ICD-10-CM | POA: Diagnosis not present

## 2023-09-26 DIAGNOSIS — Z87440 Personal history of urinary (tract) infections: Secondary | ICD-10-CM | POA: Diagnosis not present

## 2023-09-26 MED ORDER — CIPROFLOXACIN HCL 500 MG PO TABS
500.0000 mg | ORAL_TABLET | Freq: Once | ORAL | Status: AC
Start: 2023-09-26 — End: 2023-09-26
  Administered 2023-09-26: 500 mg via ORAL

## 2023-09-27 ENCOUNTER — Other Ambulatory Visit (HOSPITAL_BASED_OUTPATIENT_CLINIC_OR_DEPARTMENT_OTHER): Payer: Self-pay | Admitting: Family Medicine

## 2023-10-03 ENCOUNTER — Ambulatory Visit: Attending: Nurse Practitioner | Admitting: Nurse Practitioner

## 2023-10-03 VITALS — BP 116/80 | HR 77 | Ht 65.0 in | Wt 255.6 lb

## 2023-10-03 DIAGNOSIS — I5032 Chronic diastolic (congestive) heart failure: Secondary | ICD-10-CM | POA: Diagnosis not present

## 2023-10-03 DIAGNOSIS — G4733 Obstructive sleep apnea (adult) (pediatric): Secondary | ICD-10-CM

## 2023-10-03 DIAGNOSIS — I77810 Thoracic aortic ectasia: Secondary | ICD-10-CM | POA: Diagnosis not present

## 2023-10-03 DIAGNOSIS — Z86718 Personal history of other venous thrombosis and embolism: Secondary | ICD-10-CM | POA: Diagnosis not present

## 2023-10-03 DIAGNOSIS — I340 Nonrheumatic mitral (valve) insufficiency: Secondary | ICD-10-CM

## 2023-10-03 DIAGNOSIS — R002 Palpitations: Secondary | ICD-10-CM | POA: Diagnosis not present

## 2023-10-03 DIAGNOSIS — I10 Essential (primary) hypertension: Secondary | ICD-10-CM | POA: Diagnosis not present

## 2023-10-03 DIAGNOSIS — I2510 Atherosclerotic heart disease of native coronary artery without angina pectoris: Secondary | ICD-10-CM

## 2023-10-03 MED ORDER — TORSEMIDE 20 MG PO TABS: 20.0000 mg | ORAL_TABLET | Freq: Every day | ORAL | 3 refills | Status: DC

## 2023-10-03 MED ORDER — POTASSIUM CHLORIDE CRYS ER 10 MEQ PO TBCR: 10.0000 meq | EXTENDED_RELEASE_TABLET | Freq: Two times a day (BID) | ORAL | 3 refills | Status: DC

## 2023-10-03 MED ORDER — TORSEMIDE 10 MG PO TABS: 10.0000 mg | ORAL_TABLET | Freq: Two times a day (BID) | ORAL | 3 refills | Status: DC

## 2023-10-03 MED ORDER — POTASSIUM CHLORIDE CRYS ER 20 MEQ PO TBCR: 20.0000 meq | EXTENDED_RELEASE_TABLET | Freq: Two times a day (BID) | ORAL | 3 refills | Status: DC

## 2023-10-03 NOTE — Patient Instructions (Addendum)
 Medication Instructions:  Increase Torsemide  10 mg twice daily Take Potassium 20 mEq daily  *If you need a refill on your cardiac medications before your next appointment, please call your pharmacy*  Lab Work: BMET today BMET in 7-10 days   Testing/Procedures: NONE ordered at this time of appointment   Follow-Up: At [facility], you and your health needs are our priority.  As part of our continuing mission to provide you with exceptional heart care, our providers are all part of one team.  This team includes your primary Cardiologist (physician) and Advanced Practice Providers or APPs (Physician Assistants and Nurse Practitioners) who all work together to provide you with the care you need, when you need it.  Your next appointment:   6 week(s)  Provider:   Marlana Silvan, NP          We recommend signing up for the patient portal called MyChart.  Sign up information is provided on this After Visit Summary.  MyChart is used to connect with patients for Virtual Visits (Telemedicine).  Patients are able to view lab/test results, encounter notes, upcoming appointments, etc.  Non-urgent messages can be sent to your provider as well.   To learn more about what you can do with MyChart, go to [email or web address].

## 2023-10-03 NOTE — Progress Notes (Unsigned)
 H&V Care Navigation CSW Progress Note  Clinical Social Worker met with patient and pt sister [name] today in clinic to introduce self in person and provide assistance application for [employer]. Pt plans to bring to appt tomorrow to establish with PCP team. Per pt she now has Medicaid, encouraged her to call billing and provide them with any new ID information she may have and request that they rebill that. Note that only Medicaid family planning is pulling at this time and that will not provide additional coverage, but billing department and pharmacy should be able to run any new benefits. No additional questions at this time.   Patient is participating in a Managed Medicaid Plan:  No, UHC Medicare only  SDOH Screenings   Food Insecurity: Food Insecurity Present (10/03/2023)  Housing: High Risk (10/03/2023)  Transportation Needs: Unmet Transportation Needs (10/03/2023)  Utilities: At Risk (08/30/2023)  Alcohol  Screen: Low Risk  (10/03/2023)  Depression (PHQ2-9): Low Risk  (08/16/2023)  Financial Resource Strain: High Risk (10/03/2023)  Physical Activity: Insufficiently Active (10/03/2023)  Social Connections: Moderately Integrated (10/03/2023)  Stress: No Stress Concern Present (10/03/2023)  Recent Concern: Stress - Stress Concern Present (07/28/2023)  Tobacco Use: Medium Risk (10/03/2023)  Health Literacy: Adequate Health Literacy (07/28/2023)    Nathen Balder, MSW, LCSW Clinical Social Worker II [facility] Heart/Vascular Care Navigation  [phone]- work cell phone (preferred)

## 2023-10-03 NOTE — Progress Notes (Unsigned)
 Office Visit    Patient Name: LADASHA SCHNACKENBERG Date of Encounter: 10/03/2023  Primary Care Provider:  No primary care provider on file. Primary Cardiologist:  Randene Bustard, MD  Chief Complaint    68 year old female with a history of minimal minimal nonobstructive CAD, PSVT, chronic diastolic heart failure, mild mitral valve regurgitation, mild dilation of the ascending aorta, hypertension, bilateral DVTs s/p IVC filter in 2009, OSA, and obesity who presents for follow-up related to CAD and heart failure.   Past Medical History    Past Medical History:  Diagnosis Date   Allergy 2010   Anemia    takes Ferrous Sulfate  daily   Anxiety    takes Citalopram  daily   Arthritis    Asthma    2004-prior to gastric bypass and no problems since   CAP (community acquired pneumonia) 07/23/2021   CHF (congestive heart failure) (HCC)    takes Lasix  daily as needed   Chronic back pain    spondylolisthesis/stenosis/radiculopathy   CKD (chronic kidney disease) stage 2, GFR 60-89 ml/min    Complication of anesthesia yrs ago   slow to wake up   Depression    Diabetes (HCC)    DVT (deep venous thrombosis) (HCC) 01/17/2013   past hx. -tx.5-6 yrs ago bilateral legs, occ. sporadic swelling, has IVC filter implanted   Dysrhythmia    heart tends to flutter   Fibromyalgia    Fracture    right foot and is in a cam boot   Gallstones    GERD (gastroesophageal reflux disease)    hx of-no meds now   HCAP (healthcare-associated pneumonia) 02/19/2023   Heart murmur    History of bronchitis 2012 or 2013   History of colon polyps    Hypertension    takes Metoprolol  daily   Insomnia    takes Melatonin daily   Microalbuminuria    Pelvic floor dysfunction    Peripheral neuropathy    Pneumonia 90's   hx of   Recurrent Clostridioides difficile infection 02/28/2020   S/P gastric bypass 2003   Sleep apnea    no cpap used in many yrs after weight lost-no machine now   Urinary frequency     Urinary urgency    Vitamin D  deficiency    Past Surgical History:  Procedure Laterality Date   BALLOON DILATION N/A 01/31/2013   Procedure: BALLOON DILATION;  Surgeon: Evangeline Hilts, MD;  Location: [facility];  Service: Endoscopy;  Laterality: N/A;   CARDIAC EVENT MONITOR  03/2019   Predominantly sinus rhythm.  Rates range from 48-124 bpm.  Average 74 bpm.  Frequent short bursts (3-15 beats) PAT/PSVT--not indicated as being symptomatic on diary.  Otherwise rare PACs and PVCs.   CARDIAC EVENT MONITOR  08/2021   14-Day Zio Patch: Predominantly sinus rhythm with rate range 45 to 135 bpm.  Average 73 bpm.  Occasional (1.3%) PACs with rare PVCs and rare PAC couplets.  24 episodes of atrial runs: Fastest was 11 beats at a rate of 184 bpm, longest was 13 beats with a rate of 92 bpm.  No sustained tachyarrhythmias or bradycardia arrhythmias.  No atrial fibrillation or flutter.   CARDIOPULMONARY EXERCISE STRESS TEST (CPX)  10/27/2021   Good effort despite dyspnea and wheezing.  Modified Naughton protocol on treadmill.  Normal heart rate response.  No sustained arrhythmias.  Preexercise spirometry normal.  Low normal peak VO2. Low normal functional capacity with NO Clear Cardiopulmonary Limitation.  Overall limitation primarily due to obesity & deconditioning, along  with a component of Diastolic Dysfunction   CHOLECYSTECTOMY  2007   COLONOSCOPY     CT CTA CORONARY W/CA SCORE W/CM &/OR WO/CM  09/21/2019   Coronary Calcium  Score 0.  Normal RCA dominant coronary anatomy.  CAD RADS 1-minimal nonobstructive CAD (0-24%).  Consider nonatherosclerotic cause for chest pain.  Consider preventative therapy and risk factor modification.   DILATION AND CURETTAGE OF UTERUS  yrs ago   ESOPHAGOGASTRODUODENOSCOPY (EGD) WITH PROPOFOL   03/01/2012   Procedure: ESOPHAGOGASTRODUODENOSCOPY (EGD) WITH PROPOFOL ;  Surgeon: Evangeline Hilts, MD;  Location: [facility];  Service: Endoscopy;  Laterality: N/A;    ESOPHAGOGASTRODUODENOSCOPY (EGD) WITH PROPOFOL  N/A 01/31/2013   Procedure: ESOPHAGOGASTRODUODENOSCOPY (EGD) WITH PROPOFOL ;  Surgeon: Evangeline Hilts, MD;  Location: [facility];  Service: Endoscopy;  Laterality: N/A;   ESOPHAGOGASTRODUODENOSCOPY (EGD) WITH PROPOFOL  N/A 09/02/2015   Procedure: ESOPHAGOGASTRODUODENOSCOPY (EGD) WITH PROPOFOL ;  Surgeon: Kenney Peacemaker, MD;  Location: [facility];  Service: Endoscopy;  Laterality: N/A;   FRACTURE SURGERY     GASTRIC BY-PASS  2004   HERNIA REPAIR  2005   INSERTION OF VENA CAVA FILTER  01/17/2013   inserted 2004- abdomen   LIGAMENT REPAIR Right 1987   Rt. knee scope   MAXIMUM ACCESS (MAS)POSTERIOR LUMBAR INTERBODY FUSION (PLIF) 2 LEVEL N/A 06/07/2014   Procedure: L4-5 L5-S1 FOR MAXIMUM ACCESS (MAS) POSTERIOR LUMBAR INTERBODY FUSION ;  Surgeon: Manya Sells, MD;  Location: [facility];  Service: Neurosurgery;  Laterality: N/A;  L4-5 L5-S1 FOR MAXIMUM ACCESS (MAS) POSTERIOR LUMBAR INTERBODY FUSION    SPINE SURGERY  2017   TONSILLECTOMY     as child   TRANSTHORACIC ECHOCARDIOGRAM  12/2020   EF 55 to 60%.  No RWMA.  Mild LVH.  GR 1 DD-moderate LA dilation..  Mild MR..  Normal RV size/function.  Normal PAP.  Normal RAP. => Essentially stable from December 2020    Allergies  Allergies  Allergen Reactions   Macrobid  [Nitrofurantoin ] Other (See Comments)    C.Diff Infection   Carafate  [Sucralfate ] Hives, Itching and Other (See Comments)    Angioedema      Sulfonamide Derivatives Rash    Syncope   Infed  [Iron Dextran ]      Labs/Other Studies Reviewed    The following studies were reviewed today:  Cardiac Studies & Procedures   ______________________________________________________________________________________________     ECHOCARDIOGRAM  ECHOCARDIOGRAM COMPLETE 01/21/2023  Narrative ECHOCARDIOGRAM REPORT    Patient Name:   Katharine A Keatley Date of Exam: 01/21/2023 Medical Rec #:  [id number]           Height:       65.0  in Accession #:    [id number]          Weight:       247.0 lb Date of Birth:  [birth year]           BSA:          2.164 m Patient Age:    67 years            BP:           144/110 mmHg Patient Gender: F                   HR:           70 bpm. Exam Location:  Inpatient  Procedure: 2D Echo, Cardiac Doppler and Color Doppler  Indications:    TIA G45.9  History:        Patient has prior history of Echocardiogram  examinations, most recent 01/14/2021. COVID; Risk Factors:Hypertension, Diabetes and Former Smoker.  Sonographer:    Adelia Homestead RVT RCS Referring Phys: [phone] MCNEILL P KIRKPATRICK   Sonographer Comments: Technically challenging study due to limited acoustic windows, Technically difficult study due to poor echo windows, suboptimal parasternal window and suboptimal apical window. Image acquisition challenging due to patient body habitus. Unable to complete ECHO; patient unable to tolerate probe pressure. IMPRESSIONS   1. Left ventricular ejection fraction, by estimation, is 60 to 65%. The left ventricle has normal function. The left ventricle has no regional wall motion abnormalities. Left ventricular diastolic parameters are consistent with Grade I diastolic dysfunction (impaired relaxation). 2. Right ventricular systolic function is normal. The right ventricular size is normal. 3. The mitral valve is normal in structure. Trivial mitral valve regurgitation. No evidence of mitral stenosis. 4. Tricuspid valve regurgitation is mild to moderate. 5. The aortic valve has an indeterminant number of cusps. Aortic valve regurgitation is not visualized. No aortic stenosis is present. 6. There is borderline dilatation of the aortic root, measuring 37 mm. 7. The inferior vena cava is normal in size with greater than 50% respiratory variability, suggesting right atrial pressure of 3 mmHg.  FINDINGS Left Ventricle: Left ventricular ejection fraction, by estimation, is 60 to 65%. The left  ventricle has normal function. The left ventricle has no regional wall motion abnormalities. The left ventricular internal cavity size was normal in size. There is no left ventricular hypertrophy. Left ventricular diastolic parameters are consistent with Grade I diastolic dysfunction (impaired relaxation).  Right Ventricle: The right ventricular size is normal. No increase in right ventricular wall thickness. Right ventricular systolic function is normal.  Left Atrium: Left atrial size was normal in size.  Right Atrium: Right atrial size was normal in size.  Pericardium: There is no evidence of pericardial effusion. Presence of epicardial fat layer.  Mitral Valve: The mitral valve is normal in structure. Trivial mitral valve regurgitation. No evidence of mitral valve stenosis.  Tricuspid Valve: The tricuspid valve is normal in structure. Tricuspid valve regurgitation is mild to moderate. No evidence of tricuspid stenosis.  Aortic Valve: The aortic valve has an indeterminant number of cusps. Aortic valve regurgitation is not visualized. No aortic stenosis is present. Aortic valve mean gradient measures 3.0 mmHg. Aortic valve peak gradient measures 6.2 mmHg. Aortic valve area, by VTI measures 2.64 cm.  Pulmonic Valve: The pulmonic valve was not well visualized. Pulmonic valve regurgitation is not visualized. No evidence of pulmonic stenosis.  Aorta: There is borderline dilatation of the aortic root, measuring 37 mm.  Venous: The inferior vena cava is normal in size with greater than 50% respiratory variability, suggesting right atrial pressure of 3 mmHg.  IAS/Shunts: No atrial level shunt detected by color flow Doppler.   LEFT VENTRICLE PLAX 2D LVIDd:         5.20 cm   Diastology LVIDs:         3.50 cm   LV e' medial:    5.48 cm/s LV PW:         0.90 cm   LV E/e' medial:  9.5 LV IVS:        1.00 cm   LV e' lateral:   6.53 cm/s LVOT diam:     2.10 cm   LV E/e' lateral: 8.0 LV SV:          80 LV SV Index:   37 LVOT Area:     3.46 cm   RIGHT VENTRICLE  RV S prime:     9.30 cm/s TAPSE (M-mode): 1.7 cm  LEFT ATRIUM             Index        RIGHT ATRIUM           Index LA diam:        4.40 cm 2.03 cm/m   RA Area:     16.60 cm LA Vol (A2C):   72.0 ml 33.28 ml/m  RA Volume:   44.90 ml  20.75 ml/m LA Vol (A4C):   58.0 ml 26.80 ml/m LA Biplane Vol: 65.0 ml 30.04 ml/m AORTIC VALVE                    PULMONIC VALVE AV Area (Vmax):    2.61 cm     PV Vmax:          0.86 m/s AV Area (Vmean):   2.59 cm     PV Peak grad:     2.9 mmHg AV Area (VTI):     2.64 cm     PR End Diast Vel: 6.45 msec AV Vmax:           124.00 cm/s AV Vmean:          82.800 cm/s AV VTI:            0.302 m AV Peak Grad:      6.2 mmHg AV Mean Grad:      3.0 mmHg LVOT Vmax:         93.60 cm/s LVOT Vmean:        62.000 cm/s LVOT VTI:          0.230 m LVOT/AV VTI ratio: 0.76  AORTA Ao Root diam: 3.70 cm Ao Asc diam:  3.50 cm  MITRAL VALVE MV Area (PHT): 2.80 cm    SHUNTS MV Decel Time: 271 msec    Systemic VTI:  0.23 m MV E velocity: 52.10 cm/s  Systemic Diam: 2.10 cm MV A velocity: 70.60 cm/s MV E/A ratio:  0.74  Kardie Tobb DO Electronically signed by Jerryl Morin DO Signature Date/Time: 01/21/2023/3:05:25 PM    Final    MONITORS  LONG TERM MONITOR (3-14 DAYS) 09/23/2021  Narrative   Zio Patch Wear Time:  8 days and 12 hours (2023-05-18T07:53:20-0400 to 2023-05-26T20:45:22-0400)   Predominant Underlying Rhythm Was Sinus Rhythm: Heart rate range 45 to 135 bpm.  Average 73 bpm.   24 episodes of Supraventricular Tachycardia (SVT)/Atrial Tachycardia (atrial runs): Fastest was 11 beats at 184 bpm, longest was 13 beats at an average rate 92 bpm. => Episodes were noted on log.   Occasional PACs (1.3%) with rare couplets and triplets.  Rare PVCs.   Relatively benign findings.  Reviewed in clinic.  Randene Bustard, MD   CT SCANS  CT CORONARY MORPH W/CTA COR W/SCORE  09/21/2019  Addendum 09/21/2019  6:16 PM ADDENDUM REPORT: 09/21/2019 18:14  CLINICAL DATA:  68 year old female with h/o hypertension, hyperlipidemia and dyspnea on exertion.  EXAM: Cardiac/Coronary  CTA  TECHNIQUE: The patient was scanned on a [employer].  FINDINGS: A 100 kV prospective scan was triggered in the descending thoracic aorta at 111 HU's. Axial non-contrast 3 mm slices were carried out through the heart. The data set was analyzed on a dedicated work station and scored using the Agatson method. Gantry rotation speed was 250 msecs and collimation was .6 mm. No beta blockade and 0.8 mg of sl NTG was given. The 3D  data set was reconstructed in 5% intervals of the 67-82 % of the R-R cycle. Diastolic phases were analyzed on a dedicated work station using MPR, MIP and VRT modes. The patient received 80 cc of contrast.  Aorta:  Normal size.  No calcifications.  No dissection.  Aortic Valve:  Trileaflet.  No calcifications.  Coronary Arteries:  Normal coronary origin.  Left dominance.  RCA is a small non-dominant artery.  Left main is a large artery that gives rise to LAD and LCX arteries. Left main has no plaque.  LAD is a large vessel that gives rise to one large diagonal artery and wraps around the apex. Proximal LAD has mild diffuse calcified plaque with stenosis 0-25%. Mid and distal LAD has only luminal irregularities.  D1 is a large artery with minimal plaque.  LCX is a large lumen dominant artery that gives rise to two large OM branches, PDA and PLA. There is minimal plaque.  OM1, OM2 are large branches with minimal irregularities.  PDA, PLA have minimal plaque.  Other findings:  Normal pulmonary vein drainage into the left atrium.  Normal left atrial appendage without a thrombus.  Normal size of the pulmonary artery.  IMPRESSION: 1. Coronary calcium  score of 117. This was 52 percentile for age and sex matched control.  2. Normal  coronary origin with left dominance.  3.  EXAM: Cardiac/Coronary  CTA  TECHNIQUE: The patient was scanned on a [employer].  FINDINGS: A 100 kV prospective scan was triggered in the descending thoracic aorta at 111 HU's. Axial non-contrast 3 mm slices were carried out through the heart. The data set was analyzed on a dedicated work station and scored using the Agatson method. Gantry rotation speed was 250 msecs and collimation was .6 mm. No beta blockade and 0.8 mg of sl NTG was given. The 3D data set was reconstructed in 5% intervals of the 67-82 % of the R-R cycle. Diastolic phases were analyzed on a dedicated work station using MPR, MIP and VRT modes. The patient received 80 cc of contrast.  Aorta:  Normal size.  No calcifications.  No dissection.  Aortic Valve:  Trileaflet.  No calcifications.  Coronary Arteries:  Normal coronary origin.  Right dominance.  RCA is a large dominant artery that gives rise to PDA and PLA. There is no plaque.  Left main is a large artery that gives rise to LAD and LCX arteries.  LAD is a large vessel that has no plaque.  LCX is a non-dominant artery that gives rise to one large OM1 branch. There is no plaque.  Other findings:  Normal pulmonary vein drainage into the left atrium.  Normal left atrial appendage without a thrombus.  Normal size of the pulmonary artery.  IMPRESSION: 1. Coronary calcium  score of 0. This was 0 percentile for age and sex matched control.  2. Normal coronary origin with right dominance.  3. CAD-RADS 1. Minimal non-obstructive CAD (0-24%). Consider non-atherosclerotic causes of chest pain. Consider preventive therapy and risk factor modification.   Electronically Signed By: Christoper Crafts On: 09/21/2019 18:14  Narrative EXAM: OVER-READ INTERPRETATION  CT CHEST  The following report is an over-read performed by radiologist Dr. Janeece Mechanic of [facility] on 09/21/2019. This  over-read does not include interpretation of cardiac or coronary anatomy or pathology. The coronary CTA interpretation by the cardiologist is attached.  COMPARISON:  12/10/2016  FINDINGS: Vascular: Heart is normal size.  Aorta normal caliber.  Mediastinum/Nodes: No adenopathy in the  lower mediastinum or hila.  Lungs/Pleura: Lingular scarring. No confluent opacities or effusions.  Upper Abdomen: Imaging into the upper abdomen shows no acute findings.  Musculoskeletal: Chest wall soft tissues are unremarkable. No acute bony abnormality.  IMPRESSION: No acute or significant extracardiac abnormality.  Electronically Signed: By: Janeece Mechanic M.D. On: 09/21/2019 16:03     ______________________________________________________________________________________________     Recent Labs: 01/15/2023: TSH 0.396 01/22/2023: Magnesium 2.0 06/28/2023: B Natriuretic Peptide 108.2; BUN 20; Creatinine, Ser 0.92; Hemoglobin 11.6; Platelets 269; Potassium 3.7; Sodium 142 08/11/2023: ALT 31  Recent Lipid Panel    Component Value Date/Time   CHOL 156 01/21/2023 0650   TRIG 96 01/21/2023 0650   TRIG 70 02/18/2006 0933   HDL 57 01/21/2023 0650   CHOLHDL 2.7 01/21/2023 0650   VLDL 19 01/21/2023 0650   LDLCALC 80 01/21/2023 0650    History of Present Illness    68 year old female with the above past medical history including minimal nonobstructive CAD, PSVT, chronic diastolic heart failure, mild mitral valve regurgitation, mild dilation of the ascending aorta, hypertension, bilateral DVTs s/p IVC filter in 2009, OSA, and obesity.   Coronary CT angiogram in 2021 in the setting of chest tightness, shortness of breath, revealed coronary calcium  score 117 (83rd percentile), minimal nonobstructive CAD.  Echocardiogram in 2022 showed EF 55 to 60%, grade 1 diastolic function, mildly dilated left atrium, mild mitral valve regurgitation, mild dilation of ascending aorta measuring 39 mm.  14-day Zio patch  in 2023 in the setting of palpitations showed 24 episodes of SVT, longest episode lasting 13 beats, no evidence of atrial fibrillation.  CPX in 2023 showed low normal functional capacity, no clear cardiopulmonary limitation.  Echocardiogram in 01/2023 showed EF 60 to 65%, no RWMA, G1 DD, normal RV systolic function.  She was evaluated in the ED on 06/28/2023 in the setting of shortness of breath.  She also noted symptoms of cough, general malaise, diarrhea and abdominal pain. CT of the abdomen pelvis was negative for acute process..  Chest x-ray revealed mild pulmonary edema.  BNP was minimally elevated at 108.  She had been recently started on Cipro  for UTI.  She was advised to follow-up with her PCP to rule out possible C. difficile infection.  She was treated with a course of azithromycin , prednisone , Tessalon  Perles her symptoms were felt to be due to mild bronchitis. She was discharged home in stable condition.  She was last in office on 07/12/2023 and was stable overall from a cardiac standpoint.  She did note some mild dyspnea on exertion, abdominal fullness.  She was instructed to take an additional torsemide  10 mg daily as needed.  ABIs in the setting of bilateral leg pain revealed no evidence for peripheral arterial disease.   She presents today for follow-up accompanied by her sister. Since her last visit she has been stable from a cardiac standpoint. She continues to note dyspnea on exertion, fatigue, weight fluctuation, as well as intermittent bilateral lower extremity edema. She has been taking an additional dose of torsemide  approximately 2 days a week with some improvement in her symptoms.  She denies chest pain, palpitations, dizziness, PND, orthopnea.   Home Medications    Current Outpatient Medications  Medication Sig Dispense Refill   albuterol  (PROVENTIL ) (2.5 MG/3ML) 0.083% nebulizer solution Take 3 mLs (2.5 mg total) by nebulization every 6 (six) hours as needed for wheezing or shortness  of breath. 100 mL 1   albuterol  (VENTOLIN  HFA) 108 (90 Base) MCG/ACT inhaler INHALE  2 PUFFS INTO THE LUNGS EVERY 6 HOURS AS NEEDED FOR WHEEZING OR SHORTNESS OF BREATH 3 each 2   amLODipine  (NORVASC ) 10 MG tablet Take 1 tablet (10 mg total) by mouth daily. 90 tablet 3   Ascorbic Acid  (VITAMIN C ) 1000 MG tablet Take 1,000 mg by mouth daily.     aspirin  EC 81 MG tablet Take 1 tablet (81 mg total) by mouth daily. Swallow whole. 30 tablet 12   azelastine  (ASTELIN ) 0.1 % nasal spray Place 1 spray into both nostrils 2 (two) times daily. Use in each nostril as directed 30 mL 12   benzonatate  (TESSALON ) 100 MG capsule Take 1 capsule (100 mg total) by mouth every 8 (eight) hours as needed for cough. 45 capsule 3   budesonide  (PULMICORT ) 0.25 MG/2ML nebulizer solution Take 2 mLs (0.25 mg total) by nebulization in the morning and at bedtime. 120 mL 11   buPROPion  (WELLBUTRIN  XL) 300 MG 24 hr tablet TAKE 1 TABLET(300 MG) BY MOUTH DAILY 90 tablet 3   estradiol  (ESTRACE ) 0.1 MG/GM vaginal cream USE A PEA SIZED AMOUNT ON YOUR INDEX FINGER AND APPLY TO VAGINAL OPENING 2 NIGHTS A WEEK 42.5 g 3   ferrous sulfate  325 (65 FE) MG tablet Take 1 tablet (325 mg total) by mouth daily with breakfast. 30 tablet 3   fluticasone  (FLONASE ) 50 MCG/ACT nasal spray Place 2 sprays into both nostrils daily. 16 g 6   gabapentin  (NEURONTIN ) 100 MG capsule TAKE 2 CAPSULES(200 MG) BY MOUTH TWICE DAILY 180 capsule 0   ipratropium (ATROVENT ) 0.02 % nebulizer solution Take 2.5 mLs (0.5 mg total) by nebulization every 6 (six) hours as needed for wheezing or shortness of breath. 75 mL 12   lipase/protease/amylase (CREON ) 36000 UNITS CPEP capsule Take 36,000 Units by mouth 4 (four) times daily as needed (Stomach Pain).     loperamide  (IMODIUM ) 2 MG capsule Take 1 capsule (2 mg total) by mouth as needed for diarrhea or loose stools. 30 capsule 0   montelukast  (SINGULAIR ) 10 MG tablet Take 1 tablet (10 mg total) by mouth at bedtime. 90 tablet 3    Multiple Vitamin (MULTI-VITAMIN) tablet Take 1 tablet by mouth daily.     nystatin  cream (MYCOSTATIN ) Apply 1 Application topically 2 (two) times daily. 60 g 3   ondansetron  (ZOFRAN ) 4 MG tablet Take 1 tablet (4 mg total) by mouth every 8 (eight) hours as needed for nausea or vomiting. 30 tablet 0   pantoprazole  (PROTONIX ) 40 MG tablet Take 1 tablet (40 mg total) by mouth 2 (two) times daily before a meal. 60 tablet 3   PARoxetine  (PAXIL ) 20 MG tablet Take 1 tablet (20 mg total) by mouth daily. 90 tablet 3   potassium chloride  SA (KLOR-CON  M) 20 MEQ tablet Take 1 tablet (20 mEq total) by mouth as needed (Take on tablet once a day as needed witht he aditional dose of torsemide .). 45 tablet 3   torsemide  (DEMADEX ) 10 MG tablet Take 1 tablet (10 mg total) by mouth daily. Take an additional 10 mg once a day for Lower extremity swelling, SOB, weight gain of 3 lbs overnight and 5 lbs over a weeks. 135 tablet 3   traMADol  (ULTRAM ) 50 MG tablet Take 1 tablet (50 mg total) by mouth every 6 (six) hours as needed for severe pain (pain score 7-10). 60 tablet 2   traZODone  (DESYREL ) 50 MG tablet Take 1 tablet (50 mg total) by mouth at bedtime as needed for sleep. 90 tablet 1  Vibegron (GEMTESA) 75 MG TABS Take 75 mg by mouth 2 (two) times a week.     VITAMIN D  PO Take 1 capsule by mouth daily.     No current facility-administered medications for this visit.     Review of Systems   She denies chest pain, palpitations, pnd, orthopnea, n, v, dizziness, syncope, or early satiety. All other systems reviewed and are otherwise negative except as noted above.   Physical Exam    VS:  BP 116/80 (BP Location: Left Arm, Patient Position: Sitting, Cuff Size: Large)   Pulse 77   Ht 5' 5 (1.651 m)   Wt 255 lb 9.6 oz (115.9 kg)   SpO2 95%   BMI 42.53 kg/m . STOP-Bang Score:  7      GEN: Well nourished, well developed, in no acute distress. HEENT: normal. Neck: Supple, no JVD, carotid bruits, or masses. Cardiac:  RRR, no murmurs, rubs, or gallops. No clubbing, cyanosis, nonpitting bilateral lower extremity edema.  Radials/DP/PT 2+ and equal bilaterally.  Respiratory:  Respirations regular and unlabored, clear to auscultation bilaterally. GI: Soft, nontender, nondistended, BS + x 4. MS: no deformity or atrophy. Skin: warm and dry, no rash. Neuro:  Strength and sensation are intact. Psych: Normal affect.  Accessory Clinical Findings    ECG personally reviewed by me today -    - no acute changes.   Lab Results  Component Value Date   WBC 6.7 06/28/2023   HGB 11.6 (L) 06/28/2023   HCT 36.9 06/28/2023   MCV 85.6 06/28/2023   PLT 269 06/28/2023   Lab Results  Component Value Date   CREATININE 0.92 06/28/2023   BUN 20 06/28/2023   NA 142 06/28/2023   K 3.7 06/28/2023   CL 109 06/28/2023   CO2 23 06/28/2023   Lab Results  Component Value Date   ALT 31 08/11/2023   AST 33 08/11/2023   ALKPHOS 101 08/11/2023   BILITOT 0.3 08/11/2023   Lab Results  Component Value Date   CHOL 156 01/21/2023   HDL 57 01/21/2023   LDLCALC 80 01/21/2023   TRIG 96 01/21/2023   CHOLHDL 2.7 01/21/2023    Lab Results  Component Value Date   HGBA1C 5.3 08/03/2023    Assessment & Plan    1. Chronic diastolic heart failure/shortness of breath: Echocardiogram in 01/2023 showed EF 60 to 65%, no RWMA, G1 DD, normal RV systolic function.  She reports ongoing dyspnea on exertion, intermittent bilateral lower extremity edema, weight fluctuation. Will increase torsemide  to 20 mg daily.  She will take potassium 20 mill equivalents daily.  Will check BMET today.  Will repeat BMET in 7 to 10 days. Reviewed ongoing monitoring with daily weights, sodium and fluid recommendations.    2. Nonobstructive CAD: Coronary CT angiogram in 2021 in the setting of chest tightness, shortness of breath, revealed coronary calcium  score 117 (83rd percentile), minimal nonobstructive CAD.  Recent increase in dyspnea on exertion, fatigue.   Recent echo reassuring.  Should symptoms persist despite additional diuresis, consider need for ischemic evaluation (consider coronary CT angiogram).  She is not on statin therapy.  We discussed possible initiation of statin therapy in office today, will revisit at follow-up.  Continue aspirin , amlodipine .  3. Palpitations/PSVT: 14-day Zio patch in 2023 in the setting of palpitations showed 24 episodes of SVT, longest episode lasting 13 beats, no evidence of atrial fibrillation. Denies any recent palpitations.   4. Mild mitral valve regurgitation: Not appreciated on most recent echo.  5. Mild dilation of the ascending aorta: Noted on prior CT, not appreciated on most recent echo.   6. Hypertension: BP well controlled. Continue current antihypertensive regimen.    7. Hyperlipidemia: LDL was 80 in 01/2023, above goal.  She is not on any lipid-lowering therapy at this time. We discussed possible initiation of statin therapy.  She did have a recent elevation in liver enzymes, would need close monitoring.  Alternatively, could consider referral to Pharm.D. for PCSK9 inhibitor.  Will defer for no, can revisit at follow-up.    8. History of bilateral DVTs: S/p IVC filter.    9. OSA: She is pending repeat sleep study.  Followed by pulmonology.  10.  Bilateral leg pain:  ABIs in 2017 and 07/2023 were normal.  Denies worsening symptoms.   11.  Disposition: Follow-up in 6 weeks, sooner if needed.      Jude Norton, NP 10/03/2023, 3:17 PM

## 2023-10-04 ENCOUNTER — Ambulatory Visit: Admitting: Sports Medicine

## 2023-10-04 ENCOUNTER — Encounter: Payer: Self-pay | Admitting: Sports Medicine

## 2023-10-04 ENCOUNTER — Telehealth: Payer: Self-pay | Admitting: Licensed Clinical Social Worker

## 2023-10-04 VITALS — BP 122/74 | HR 74 | Temp 97.9°F | Resp 12 | Ht 65.0 in | Wt 258.7 lb

## 2023-10-04 DIAGNOSIS — I10 Essential (primary) hypertension: Secondary | ICD-10-CM

## 2023-10-04 DIAGNOSIS — I509 Heart failure, unspecified: Secondary | ICD-10-CM

## 2023-10-04 DIAGNOSIS — R0602 Shortness of breath: Secondary | ICD-10-CM

## 2023-10-04 DIAGNOSIS — R7401 Elevation of levels of liver transaminase levels: Secondary | ICD-10-CM

## 2023-10-04 DIAGNOSIS — R2689 Other abnormalities of gait and mobility: Secondary | ICD-10-CM

## 2023-10-04 DIAGNOSIS — D649 Anemia, unspecified: Secondary | ICD-10-CM | POA: Diagnosis not present

## 2023-10-04 DIAGNOSIS — M25561 Pain in right knee: Secondary | ICD-10-CM

## 2023-10-04 DIAGNOSIS — G4733 Obstructive sleep apnea (adult) (pediatric): Secondary | ICD-10-CM

## 2023-10-04 DIAGNOSIS — Z6841 Body Mass Index (BMI) 40.0 and over, adult: Secondary | ICD-10-CM

## 2023-10-04 LAB — HEPATIC FUNCTION PANEL
AG Ratio: 1.5 (calc) (ref 1.0–2.5)
ALT: 29 U/L (ref 6–29)
AST: 23 U/L (ref 10–35)
Albumin: 3.9 g/dL (ref 3.6–5.1)
Alkaline phosphatase (APISO): 110 U/L (ref 37–153)
Bilirubin, Direct: 0.1 mg/dL (ref 0.0–0.2)
Globulin: 2.6 g/dL (ref 1.9–3.7)
Indirect Bilirubin: 0.2 mg/dL (ref 0.2–1.2)
Total Bilirubin: 0.3 mg/dL (ref 0.2–1.2)
Total Protein: 6.5 g/dL (ref 6.1–8.1)

## 2023-10-04 LAB — BASIC METABOLIC PANEL WITH GFR
BUN/Creatinine Ratio: 20 (ref 12–28)
BUN: 18 mg/dL (ref 8–27)
CO2: 20 mmol/L (ref 20–29)
Calcium: 8.9 mg/dL (ref 8.7–10.3)
Chloride: 106 mmol/L (ref 96–106)
Creatinine, Ser: 0.89 mg/dL (ref 0.57–1.00)
Glucose: 89 mg/dL (ref 70–99)
Potassium: 4 mmol/L (ref 3.5–5.2)
Sodium: 141 mmol/L (ref 134–144)
eGFR: 70 mL/min/1.73

## 2023-10-04 LAB — HEMOGLOBIN: Hemoglobin: 11.6 g/dL — ABNORMAL LOW (ref 11.7–15.5)

## 2023-10-04 MED ORDER — SEMAGLUTIDE-WEIGHT MANAGEMENT 0.25 MG/0.5ML ~~LOC~~ SOAJ: 0.2500 mg | SUBCUTANEOUS | 0 refills | Status: AC

## 2023-10-04 MED ORDER — IPRATROPIUM-ALBUTEROL 0.5-2.5 (3) MG/3ML IN SOLN
3.0000 mL | Freq: Once | RESPIRATORY_TRACT | Status: AC
  Administered 2023-10-04: 3 mL via RESPIRATORY_TRACT

## 2023-10-04 NOTE — Telephone Encounter (Signed)
 H&V Care Navigation CSW Progress Note  Clinical Social Worker contacted patient by phone to f/u on Medicaid. Per [employer] system it is pulling as [employer] only. Will reach out to pt via mychart as no answer this morning at [phone]. Recommend she reach out to DSS to clarify further and if not eligible to reach out to [facility] regarding Medicare Savings Program and Extra Help program.  Patient is participating in a Managed Medicaid Plan:  No, [facility] Medicare   SDOH Screenings   Food Insecurity: Food Insecurity Present (10/03/2023)  Housing: High Risk (10/03/2023)  Transportation Needs: Unmet Transportation Needs (10/03/2023)  Utilities: At Risk (08/30/2023)  Alcohol  Screen: Low Risk  (10/03/2023)  Depression (PHQ2-9): Low Risk  (08/16/2023)  Financial Resource Strain: High Risk (10/03/2023)  Physical Activity: Insufficiently Active (10/03/2023)  Social Connections: Moderately Integrated (10/03/2023)  Stress: No Stress Concern Present (10/03/2023)  Recent Concern: Stress - Stress Concern Present (07/28/2023)  Tobacco Use: Medium Risk (10/03/2023)  Health Literacy: Adequate Health Literacy (07/28/2023)    Nathen Balder, MSW, LCSW Clinical Social Worker II [facility] Care Navigation  [phone]- work cell phone (preferred)

## 2023-10-04 NOTE — Progress Notes (Signed)
 Careteam: Patient Care Team: Tye Gall, MD as PCP - General (Internal Medicine) Arleen Lacer, MD as PCP - Cardiology (Cardiology) Szabat, Tino Foreman, [facility] (Inactive) (Pharmacist) Duke, Warren Haber, PA as Physician Assistant (Cardiology) Clarnce Crow, RN as VBCI Care Management (General Practice) Clarnce Crow, RN  PLACE OF SERVICE:  [facility]  Advanced Directive information    Allergies  Allergen Reactions   Macrobid  [Nitrofurantoin ] Other (See Comments)    C.Diff Infection   Carafate  [Sucralfate ] Hives, Itching and Other (See Comments)    Angioedema      Sulfonamide Derivatives Rash    Syncope   Infed  [Iron Dextran ]     Chief Complaint  Patient presents with   Establish Care    New patient.     Discussed the use of AI scribe software for clinical note transcription with the patient, who gave verbal consent to proceed.  History of Present Illness 68 year old female with asthma and congestive heart failure who presents  to establish care.  She has not experienced an asthma attack in about a month but continues to have shortness of breath, particularly when lying flat, and dizziness with exertion. She uses Pulmicort  daily and albuterol  two to three times a day, along with Tessalon  Perles for nocturnal coughing. She has a scheduled appointment with her pulmonologist in July. Pt was c/o wheezing when nursing were rooming in the patient. She was placed on albuterol  nebulization. She is able speak in full sentences, does not appear to be in distress.   She reports a recent fall in her kitchen, resulting in a bruise, and uses a rollator chair in the kitchen due to difficulty standing for long periods. She had physical therapy after a hospital stay in January and feels she may need more. No other recent falls.   Recurrent UTI  Followed with urology    Urinary incontinence On gemtesa   High BMI Pt wants to loose weight and wondering if she  can get  weight loss medications  Review of Systems:  Review of Systems  Constitutional:  Negative for chills and fever.  HENT:  Negative for congestion and sore throat.   Respiratory:  Positive for shortness of breath and wheezing. Negative for cough and sputum production.   Cardiovascular:  Negative for chest pain, palpitations and leg swelling.  Gastrointestinal:  Negative for abdominal pain, heartburn and nausea.  Genitourinary:  Negative for dysuria, frequency and hematuria.  Musculoskeletal:  Positive for falls and joint pain.  Neurological:  Negative for dizziness.   Negative unless indicated in HPI.   Past Medical History:  Diagnosis Date   Allergy 2010   Anemia    takes Ferrous Sulfate  daily   Anxiety    takes Citalopram  daily   Arthritis    Asthma    2004-prior to gastric bypass and no problems since   CAP (community acquired pneumonia) 07/23/2021   CHF (congestive heart failure) (HCC)    takes Lasix  daily as needed   Chronic back pain    spondylolisthesis/stenosis/radiculopathy   CKD (chronic kidney disease) stage 2, GFR 60-89 ml/min    Complication of anesthesia yrs ago   slow to wake up   Depression    Diabetes (HCC)    DVT (deep venous thrombosis) (HCC) 01/17/2013   past hx. -tx.5-6 yrs ago bilateral legs, occ. sporadic swelling, has IVC filter implanted   Dysrhythmia    heart tends to flutter   Fibromyalgia    Fracture    right foot  and is in a cam boot   Gallstones    GERD (gastroesophageal reflux disease)    hx of-no meds now   HCAP (healthcare-associated pneumonia) 02/19/2023   Heart murmur    History of bronchitis 2012 or 2013   History of colon polyps    Hypertension    takes Metoprolol  daily   Insomnia    takes Melatonin daily   Microalbuminuria    Pelvic floor dysfunction    Peripheral neuropathy    Pneumonia 90's   hx of   Recurrent Clostridioides difficile infection 02/28/2020   S/P gastric bypass 2003   Sleep apnea    no cpap used in many  yrs after weight lost-no machine now   Urinary frequency    Urinary urgency    Vitamin D  deficiency    Past Surgical History:  Procedure Laterality Date   BALLOON DILATION N/A 01/31/2013   Procedure: BALLOON DILATION;  Surgeon: Evangeline Hilts, MD;  Location: [facility];  Service: Endoscopy;  Laterality: N/A;   CARDIAC EVENT MONITOR  03/2019   Predominantly sinus rhythm.  Rates range from 48-124 bpm.  Average 74 bpm.  Frequent short bursts (3-15 beats) PAT/PSVT--not indicated as being symptomatic on diary.  Otherwise rare PACs and PVCs.   CARDIAC EVENT MONITOR  08/2021   14-Day Zio Patch: Predominantly sinus rhythm with rate range 45 to 135 bpm.  Average 73 bpm.  Occasional (1.3%) PACs with rare PVCs and rare PAC couplets.  24 episodes of atrial runs: Fastest was 11 beats at a rate of 184 bpm, longest was 13 beats with a rate of 92 bpm.  No sustained tachyarrhythmias or bradycardia arrhythmias.  No atrial fibrillation or flutter.   CARDIOPULMONARY EXERCISE STRESS TEST (CPX)  10/27/2021   Good effort despite dyspnea and wheezing.  Modified Naughton protocol on treadmill.  Normal heart rate response.  No sustained arrhythmias.  Preexercise spirometry normal.  Low normal peak VO2. Low normal functional capacity with NO Clear Cardiopulmonary Limitation.  Overall limitation primarily due to obesity & deconditioning, along with a component of Diastolic Dysfunction   CHOLECYSTECTOMY  2007   COLONOSCOPY     CT CTA CORONARY W/CA SCORE W/CM &/OR WO/CM  09/21/2019   Coronary Calcium  Score 0.  Normal RCA dominant coronary anatomy.  CAD RADS 1-minimal nonobstructive CAD (0-24%).  Consider nonatherosclerotic cause for chest pain.  Consider preventative therapy and risk factor modification.   DILATION AND CURETTAGE OF UTERUS  yrs ago   ESOPHAGOGASTRODUODENOSCOPY (EGD) WITH PROPOFOL   03/01/2012   Procedure: ESOPHAGOGASTRODUODENOSCOPY (EGD) WITH PROPOFOL ;  Surgeon: Evangeline Hilts, MD;  Location: [facility];   Service: Endoscopy;  Laterality: N/A;   ESOPHAGOGASTRODUODENOSCOPY (EGD) WITH PROPOFOL  N/A 01/31/2013   Procedure: ESOPHAGOGASTRODUODENOSCOPY (EGD) WITH PROPOFOL ;  Surgeon: Evangeline Hilts, MD;  Location: [facility];  Service: Endoscopy;  Laterality: N/A;   ESOPHAGOGASTRODUODENOSCOPY (EGD) WITH PROPOFOL  N/A 09/02/2015   Procedure: ESOPHAGOGASTRODUODENOSCOPY (EGD) WITH PROPOFOL ;  Surgeon: Kenney Peacemaker, MD;  Location: [facility];  Service: Endoscopy;  Laterality: N/A;   FRACTURE SURGERY     GASTRIC BY-PASS  2004   HERNIA REPAIR  2005   INSERTION OF VENA CAVA FILTER  01/17/2013   inserted 2004- abdomen   LIGAMENT REPAIR Right 1987   Rt. knee scope   MAXIMUM ACCESS (MAS)POSTERIOR LUMBAR INTERBODY FUSION (PLIF) 2 LEVEL N/A 06/07/2014   Procedure: L4-5 L5-S1 FOR MAXIMUM ACCESS (MAS) POSTERIOR LUMBAR INTERBODY FUSION ;  Surgeon: Manya Sells, MD;  Location: [facility];  Service: Neurosurgery;  Laterality: N/A;  L4-5 L5-S1 FOR MAXIMUM ACCESS (MAS) POSTERIOR LUMBAR INTERBODY FUSION    SPINE SURGERY  2017   TONSILLECTOMY     as child   TRANSTHORACIC ECHOCARDIOGRAM  12/2020   EF 55 to 60%.  No RWMA.  Mild LVH.  GR 1 DD-moderate LA dilation..  Mild MR..  Normal RV size/function.  Normal PAP.  Normal RAP. => Essentially stable from December 2020   Social History:   reports that she quit smoking about 14 years ago. Her smoking use included cigarettes. She started smoking about 24 years ago. She has a 5 pack-year smoking history. She has never used smokeless tobacco. She reports that she does not currently use alcohol . She reports that she does not use drugs.  Family History  Problem Relation Age of Onset   Diabetes Mother    Atrial fibrillation Mother    Hypertension Mother    Arthritis Mother    Heart disease Father        Unsure of details   Hypertension Father    Prostate cancer Father    Alzheimer's disease Father    Arthritis Father    Cancer Father    Kidney disease Brother     Cancer Brother    Healthy Sister    Diabetes Maternal Grandmother    Heart disease Maternal Grandmother    Vision loss Maternal Grandmother    Stroke Maternal Grandfather 50   Hypertension Maternal Grandfather    Hypertension Paternal Grandmother    Alzheimer's disease Paternal Grandmother    Colon cancer Neg Hx    Colon polyps Neg Hx    Stomach cancer Neg Hx    Esophageal cancer Neg Hx    Pancreatic cancer Neg Hx    Liver disease Neg Hx     Medications: Patient's Medications  New Prescriptions   No medications on file  Previous Medications   ALBUTEROL  (PROVENTIL ) (2.5 MG/3ML) 0.083% NEBULIZER SOLUTION    Take 3 mLs (2.5 mg total) by nebulization every 6 (six) hours as needed for wheezing or shortness of breath.   ALBUTEROL  (VENTOLIN  HFA) 108 (90 BASE) MCG/ACT INHALER    INHALE 2 PUFFS INTO THE LUNGS EVERY 6 HOURS AS NEEDED FOR WHEEZING OR SHORTNESS OF BREATH   AMLODIPINE  (NORVASC ) 10 MG TABLET    Take 1 tablet (10 mg total) by mouth daily.   ASCORBIC ACID  (VITAMIN C ) 1000 MG TABLET    Take 1,000 mg by mouth daily.   ASPIRIN  EC 81 MG TABLET    Take 1 tablet (81 mg total) by mouth daily. Swallow whole.   AZELASTINE  (ASTELIN ) 0.1 % NASAL SPRAY    Place 1 spray into both nostrils 2 (two) times daily. Use in each nostril as directed   BENZONATATE  (TESSALON ) 100 MG CAPSULE    Take 1 capsule (100 mg total) by mouth every 8 (eight) hours as needed for cough.   BUDESONIDE  (PULMICORT ) 0.25 MG/2ML NEBULIZER SOLUTION    Take 2 mLs (0.25 mg total) by nebulization in the morning and at bedtime.   BUPROPION  (WELLBUTRIN  XL) 300 MG 24 HR TABLET    TAKE 1 TABLET(300 MG) BY MOUTH DAILY   ESTRADIOL  (ESTRACE ) 0.1 MG/GM VAGINAL CREAM    USE A PEA SIZED AMOUNT ON YOUR INDEX FINGER AND APPLY TO VAGINAL OPENING 2 NIGHTS A WEEK   FERROUS SULFATE  325 (65 FE) MG TABLET    Take 1 tablet (325 mg total) by mouth daily with breakfast.   FLUTICASONE  (FLONASE ) 50 MCG/ACT NASAL SPRAY  Place 2 sprays into both  nostrils daily.   GABAPENTIN  (NEURONTIN ) 100 MG CAPSULE    TAKE 2 CAPSULES(200 MG) BY MOUTH TWICE DAILY   IPRATROPIUM (ATROVENT ) 0.02 % NEBULIZER SOLUTION    Take 2.5 mLs (0.5 mg total) by nebulization every 6 (six) hours as needed for wheezing or shortness of breath.   LIPASE/PROTEASE/AMYLASE (CREON ) 36000 UNITS CPEP CAPSULE    Take 36,000 Units by mouth 4 (four) times daily as needed (Stomach Pain).   LOPERAMIDE  (IMODIUM ) 2 MG CAPSULE    Take 1 capsule (2 mg total) by mouth as needed for diarrhea or loose stools.   MONTELUKAST  (SINGULAIR ) 10 MG TABLET    Take 1 tablet (10 mg total) by mouth at bedtime.   MULTIPLE VITAMIN (MULTI-VITAMIN) TABLET    Take 1 tablet by mouth daily.   NYSTATIN  CREAM (MYCOSTATIN )    Apply 1 Application topically 2 (two) times daily.   ONDANSETRON  (ZOFRAN ) 4 MG TABLET    Take 1 tablet (4 mg total) by mouth every 8 (eight) hours as needed for nausea or vomiting.   PANTOPRAZOLE  (PROTONIX ) 40 MG TABLET    Take 1 tablet (40 mg total) by mouth 2 (two) times daily before a meal.   PAROXETINE  (PAXIL ) 20 MG TABLET    Take 1 tablet (20 mg total) by mouth daily.   POTASSIUM CHLORIDE  SA (KLOR-CON  M) 20 MEQ TABLET    Take 1 tablet (20 mEq total) by mouth 2 (two) times daily.   TORSEMIDE  (DEMADEX ) 10 MG TABLET    Take 1 tablet (10 mg total) by mouth 2 (two) times daily.   TRAMADOL  (ULTRAM ) 50 MG TABLET    Take 1 tablet (50 mg total) by mouth every 6 (six) hours as needed for severe pain (pain score 7-10).   TRAZODONE  (DESYREL ) 50 MG TABLET    Take 1 tablet (50 mg total) by mouth at bedtime as needed for sleep.   VIBEGRON (GEMTESA) 75 MG TABS    Take 75 mg by mouth 2 (two) times a week.   VITAMIN D  PO    Take 1 capsule by mouth daily.  Modified Medications   No medications on file  Discontinued Medications   No medications on file    Physical Exam: There were no vitals filed for this visit. There is no height or weight on file to calculate BMI. BP Readings from Last 3  Encounters:  10/03/23 116/80  09/26/23 (!) 148/83  09/01/23 (!) 146/84   Wt Readings from Last 3 Encounters:  10/03/23 255 lb 9.6 oz (115.9 kg)  09/26/23 257 lb (116.6 kg)  09/02/23 259 lb (117.5 kg)    Physical Exam Constitutional:      Appearance: Normal appearance.  HENT:     Head: Normocephalic and atraumatic.   Cardiovascular:     Rate and Rhythm: Normal rate and regular rhythm.  Pulmonary:     Effort: Pulmonary effort is normal. No respiratory distress.     Breath sounds: Normal breath sounds. No wheezing.  Abdominal:     General: Bowel sounds are normal. There is no distension.     Tenderness: There is no abdominal tenderness. There is no guarding or rebound.     Comments:     Musculoskeletal:        General: No swelling.     Comments: Rt knee - no redness, no swelling  Joint line tenderness+   Neurological:     Mental Status: She is alert. Mental status is at baseline.  Motor: No weakness.     Labs reviewed: Basic Metabolic Panel: Recent Labs    01/15/23 1923 01/15/23 2111 01/16/23 0908 01/17/23 0450 01/22/23 0910 02/11/23 1617 05/18/23 1420 06/28/23 1818 10/03/23 1704  NA  --  142 143   < > 141   < > 144 142 141  K  --  3.4* 4.2   < > 4.0   < > 4.2 3.7 4.0  CL  --  112* 113*   < > 111   < > 110* 109 106  CO2  --  27 23   < > 22   < > 20 23 20   GLUCOSE  --  90 203*   < > 115*   < > 125* 132* 89  BUN  --  10 9   < > 18   < > 15 20 18   CREATININE  --  0.77 0.75   < > 0.94   < > 0.88 0.92 0.89  CALCIUM   --  8.3* 8.5*   < > 8.5*   < > 8.8 8.5* 8.9  MG 2.1  --  2.1  --  2.0  --   --   --   --   PHOS  --   --  2.9  --   --   --   --   --   --   TSH  --  0.396  --   --   --   --   --   --   --    < > = values in this interval not displayed.   Liver Function Tests: Recent Labs    05/18/23 1420 06/28/23 1818 08/11/23 1534  AST 577* 31 33  ALT 168* 46* 31  ALKPHOS 165* 136* 101  BILITOT 0.7 0.4 0.3  PROT 6.3 6.7 6.6  ALBUMIN  3.9 3.8 4.3    Recent Labs    01/15/23 2344  LIPASE 31   Recent Labs    01/15/23 2344 01/21/23 0650  AMMONIA 52* 30   CBC: Recent Labs    02/15/23 1602 02/18/23 2115 02/19/23 0445 03/14/23 0854 06/28/23 1818  WBC 7.5 7.8 7.4 7.6 6.7  NEUTROABS 4.4 5.0  --  6.0  --   HGB 11.7 10.4* 10.5* 11.2 11.6*  HCT 37.8 33.4* 34.2* 36.2 36.9  MCV 86 86.3 87.5 86 85.6  PLT 314 236 251 231 269   Lipid Panel: Recent Labs    12/27/22 1540 01/21/23 0650  CHOL 148 156  HDL 68.70 57  LDLCALC 65 80  TRIG 70.0 96  CHOLHDL 2 2.7   TSH: Recent Labs    01/15/23 2111  TSH 0.396   A1C: Lab Results  Component Value Date   HGBA1C 5.3 08/03/2023    Assessment and Plan Assessment & Plan   1. Shortness of breath (Primary)  Pt was wheezing on arrival s He was placed on duoneb Pt states heat some times causes sob, wheezing She is able to speak in full sentences Does not appear to be in distress - ipratropium-albuterol  (DUONEB) 0.5-2.5 (3) MG/3ML nebulizer solution 3 mL  2. Essential hypertension  Ap at goal   3. Chronic congestive heart failure, unspecified heart failure type (HCC)  Euvolemic on exam Follow up with cardiology Avoid salty foods Cont with torsemide   4. OBSTRUCTIVE SLEEP APNEA  Instructed to follow up with pulm for CPAP machine  5. Transaminitis   - Hepatic Function Panel  6. Acute pain of right knee  No redness, no swelling Instructed patient to limit tramadol   Take tylenol  650 g q6 prn  Use heating pads Use lidocaine  patch - AMB referral to orthopedics  7. Balance problem   - Ambulatory referral to Home Health  8. Anemia, unspecified type   - Hemoglobin

## 2023-10-05 ENCOUNTER — Ambulatory Visit: Payer: Self-pay | Admitting: Sports Medicine

## 2023-10-05 ENCOUNTER — Encounter: Payer: Self-pay | Admitting: Nurse Practitioner

## 2023-10-06 ENCOUNTER — Ambulatory Visit: Payer: Self-pay | Admitting: Nurse Practitioner

## 2023-10-06 DIAGNOSIS — K8681 Exocrine pancreatic insufficiency: Secondary | ICD-10-CM | POA: Diagnosis not present

## 2023-10-06 DIAGNOSIS — Z9889 Other specified postprocedural states: Secondary | ICD-10-CM | POA: Diagnosis not present

## 2023-10-06 DIAGNOSIS — K638219 Small intestinal bacterial overgrowth, unspecified: Secondary | ICD-10-CM | POA: Diagnosis not present

## 2023-10-06 DIAGNOSIS — N3020 Other chronic cystitis without hematuria: Secondary | ICD-10-CM | POA: Diagnosis not present

## 2023-10-06 DIAGNOSIS — K219 Gastro-esophageal reflux disease without esophagitis: Secondary | ICD-10-CM | POA: Diagnosis not present

## 2023-10-06 DIAGNOSIS — G8929 Other chronic pain: Secondary | ICD-10-CM | POA: Diagnosis not present

## 2023-10-06 DIAGNOSIS — R1310 Dysphagia, unspecified: Secondary | ICD-10-CM | POA: Diagnosis not present

## 2023-10-06 DIAGNOSIS — N321 Vesicointestinal fistula: Secondary | ICD-10-CM | POA: Diagnosis not present

## 2023-10-06 DIAGNOSIS — Z8719 Personal history of other diseases of the digestive system: Secondary | ICD-10-CM | POA: Diagnosis not present

## 2023-10-06 DIAGNOSIS — R1033 Periumbilical pain: Secondary | ICD-10-CM | POA: Diagnosis not present

## 2023-10-07 DIAGNOSIS — M4800 Spinal stenosis, site unspecified: Secondary | ICD-10-CM | POA: Diagnosis not present

## 2023-10-07 DIAGNOSIS — M797 Fibromyalgia: Secondary | ICD-10-CM | POA: Diagnosis not present

## 2023-10-07 DIAGNOSIS — I130 Hypertensive heart and chronic kidney disease with heart failure and stage 1 through stage 4 chronic kidney disease, or unspecified chronic kidney disease: Secondary | ICD-10-CM | POA: Diagnosis not present

## 2023-10-07 DIAGNOSIS — G4733 Obstructive sleep apnea (adult) (pediatric): Secondary | ICD-10-CM | POA: Diagnosis not present

## 2023-10-07 DIAGNOSIS — D631 Anemia in chronic kidney disease: Secondary | ICD-10-CM | POA: Diagnosis not present

## 2023-10-07 DIAGNOSIS — G8929 Other chronic pain: Secondary | ICD-10-CM | POA: Diagnosis not present

## 2023-10-07 DIAGNOSIS — I493 Ventricular premature depolarization: Secondary | ICD-10-CM | POA: Diagnosis not present

## 2023-10-07 DIAGNOSIS — I509 Heart failure, unspecified: Secondary | ICD-10-CM | POA: Diagnosis not present

## 2023-10-07 DIAGNOSIS — E1122 Type 2 diabetes mellitus with diabetic chronic kidney disease: Secondary | ICD-10-CM | POA: Diagnosis not present

## 2023-10-07 DIAGNOSIS — I491 Atrial premature depolarization: Secondary | ICD-10-CM | POA: Diagnosis not present

## 2023-10-07 DIAGNOSIS — E559 Vitamin D deficiency, unspecified: Secondary | ICD-10-CM | POA: Diagnosis not present

## 2023-10-07 DIAGNOSIS — Q6100 Congenital renal cyst, unspecified: Secondary | ICD-10-CM | POA: Diagnosis not present

## 2023-10-07 DIAGNOSIS — G4700 Insomnia, unspecified: Secondary | ICD-10-CM | POA: Diagnosis not present

## 2023-10-07 DIAGNOSIS — M5416 Radiculopathy, lumbar region: Secondary | ICD-10-CM | POA: Diagnosis not present

## 2023-10-07 DIAGNOSIS — J45909 Unspecified asthma, uncomplicated: Secondary | ICD-10-CM | POA: Diagnosis not present

## 2023-10-07 DIAGNOSIS — E611 Iron deficiency: Secondary | ICD-10-CM | POA: Diagnosis not present

## 2023-10-07 DIAGNOSIS — M5010 Cervical disc disorder with radiculopathy, unspecified cervical region: Secondary | ICD-10-CM | POA: Diagnosis not present

## 2023-10-07 DIAGNOSIS — M439 Deforming dorsopathy, unspecified: Secondary | ICD-10-CM | POA: Diagnosis not present

## 2023-10-07 DIAGNOSIS — E1142 Type 2 diabetes mellitus with diabetic polyneuropathy: Secondary | ICD-10-CM | POA: Diagnosis not present

## 2023-10-07 DIAGNOSIS — M1990 Unspecified osteoarthritis, unspecified site: Secondary | ICD-10-CM | POA: Diagnosis not present

## 2023-10-07 DIAGNOSIS — I951 Orthostatic hypotension: Secondary | ICD-10-CM | POA: Diagnosis not present

## 2023-10-07 DIAGNOSIS — N182 Chronic kidney disease, stage 2 (mild): Secondary | ICD-10-CM | POA: Diagnosis not present

## 2023-10-07 DIAGNOSIS — M549 Dorsalgia, unspecified: Secondary | ICD-10-CM | POA: Diagnosis not present

## 2023-10-12 ENCOUNTER — Ambulatory Visit: Payer: Medicare HMO

## 2023-10-15 ENCOUNTER — Other Ambulatory Visit: Payer: Self-pay | Admitting: Family Medicine

## 2023-10-17 ENCOUNTER — Encounter: Payer: Self-pay | Admitting: Urology

## 2023-10-17 ENCOUNTER — Telehealth: Payer: Self-pay | Admitting: *Deleted

## 2023-10-17 ENCOUNTER — Other Ambulatory Visit: Payer: Self-pay | Admitting: Urology

## 2023-10-17 DIAGNOSIS — R350 Frequency of micturition: Secondary | ICD-10-CM

## 2023-10-17 MED ORDER — GEMTESA 75 MG PO TABS: 1.0000 | ORAL_TABLET | Freq: Every day | ORAL | 11 refills | Status: DC

## 2023-10-17 NOTE — Telephone Encounter (Signed)
 Copied from CRM [id number]. Topic: General - Other >> Oct 17, 2023  2:34 PM Diannia H wrote: Reason for CRM: April from [facility] called to let provider know that the patient does not want to start physical, April stated the patient didn't give a reason she just said she's not interested.    FYI

## 2023-10-25 ENCOUNTER — Ambulatory Visit (INDEPENDENT_AMBULATORY_CARE_PROVIDER_SITE_OTHER)

## 2023-10-25 ENCOUNTER — Other Ambulatory Visit: Payer: Self-pay

## 2023-10-25 ENCOUNTER — Ambulatory Visit: Admitting: Orthopaedic Surgery

## 2023-10-25 ENCOUNTER — Telehealth: Payer: Self-pay | Admitting: Radiology

## 2023-10-25 ENCOUNTER — Ambulatory Visit (INDEPENDENT_AMBULATORY_CARE_PROVIDER_SITE_OTHER): Admitting: Sports Medicine

## 2023-10-25 DIAGNOSIS — M25532 Pain in left wrist: Secondary | ICD-10-CM

## 2023-10-25 DIAGNOSIS — M19011 Primary osteoarthritis, right shoulder: Secondary | ICD-10-CM

## 2023-10-25 DIAGNOSIS — G8929 Other chronic pain: Secondary | ICD-10-CM

## 2023-10-25 DIAGNOSIS — I77810 Thoracic aortic ectasia: Secondary | ICD-10-CM | POA: Diagnosis not present

## 2023-10-25 DIAGNOSIS — M25511 Pain in right shoulder: Secondary | ICD-10-CM | POA: Diagnosis not present

## 2023-10-25 DIAGNOSIS — R002 Palpitations: Secondary | ICD-10-CM | POA: Diagnosis not present

## 2023-10-25 DIAGNOSIS — I2510 Atherosclerotic heart disease of native coronary artery without angina pectoris: Secondary | ICD-10-CM | POA: Diagnosis not present

## 2023-10-25 DIAGNOSIS — I340 Nonrheumatic mitral (valve) insufficiency: Secondary | ICD-10-CM | POA: Diagnosis not present

## 2023-10-25 DIAGNOSIS — I5032 Chronic diastolic (congestive) heart failure: Secondary | ICD-10-CM | POA: Diagnosis not present

## 2023-10-25 MED ORDER — BUPIVACAINE HCL 0.25 % IJ SOLN
2.0000 mL | INTRAMUSCULAR | Status: AC | PRN
Start: 2023-10-25 — End: 2023-10-25
  Administered 2023-10-25: 2 mL via INTRA_ARTICULAR

## 2023-10-25 MED ORDER — TORSEMIDE 10 MG PO TABS: 10.0000 mg | ORAL_TABLET | Freq: Two times a day (BID) | ORAL | 3 refills | Status: DC

## 2023-10-25 MED ORDER — LIDOCAINE HCL 1 % IJ SOLN
2.0000 mL | INTRAMUSCULAR | Status: AC | PRN
Start: 2023-10-25 — End: 2023-10-25
  Administered 2023-10-25: 2 mL

## 2023-10-25 MED ORDER — POTASSIUM CHLORIDE CRYS ER 20 MEQ PO TBCR: 20.0000 meq | EXTENDED_RELEASE_TABLET | Freq: Two times a day (BID) | ORAL | 3 refills | Status: AC

## 2023-10-25 MED ORDER — METHYLPREDNISOLONE ACETATE 40 MG/ML IJ SUSP
40.0000 mg | INTRAMUSCULAR | Status: AC | PRN
  Administered 2023-10-25: 40 mg via INTRA_ARTICULAR

## 2023-10-25 MED ORDER — HYDROCODONE-ACETAMINOPHEN 5-325 MG PO TABS
1.0000 | ORAL_TABLET | Freq: Four times a day (QID) | ORAL | 0 refills | Status: DC | PRN
Start: 2023-10-25 — End: 2023-11-15

## 2023-10-25 NOTE — Addendum Note (Signed)
 Addended by: JERRI LOISE SHARPER on: 10/25/2023 05:13 PM   Modules accepted: Orders

## 2023-10-25 NOTE — Telephone Encounter (Signed)
 I sent hydrocodone 
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
## 2023-10-25 NOTE — Telephone Encounter (Signed)
 Patient called in crying states that she is in so much pain with her wrist.  She states that she has tried ice, heat, tylenol  and has not had any relief.  Please call her to advise. [phone]

## 2023-10-25 NOTE — Progress Notes (Signed)
   Procedure Note  Patient: [name]             Date of Birth: 09/06/1955           MRN: [id number]             Visit Date: 10/25/2023  Procedures: Visit Diagnoses:  1. Chronic right shoulder pain   2. Primary osteoarthritis, right shoulder    Large Joint Inj: R glenohumeral on 10/25/2023 11:00 AM Indications: pain Details: 22 G 3.5 in needle, ultrasound-guided posterior approach Medications: 2 mL lidocaine  1 %; 2 mL bupivacaine  0.25 %; 40 mg methylPREDNISolone  acetate 40 MG/ML Outcome: tolerated well, no immediate complications  US -guided glenohumeral joint injection, right shoulder After discussion on risks/benefits/indications, informed verbal consent was obtained. A timeout was then performed. The patient was positioned lying lateral recumbent on examination table. The patient's shoulder was prepped with betadine and multiple alcohol  swabs  and utilizing ultrasound guidance, the patient's glenohumeral joint was identified on ultrasound. Using ultrasound guidance a 22-gauge, 3.5 inch needle with a mixture of 2:2:1 cc's lidocaine :bupivicaine:depomedrol was directed from a lateral to medial direction via in-plane technique into the glenohumeral joint with visualization of appropriate spread of injectate into the joint. Patient tolerated the procedure well without immediate complications.      Procedure, treatment alternatives, risks and benefits explained, specific risks discussed. Consent was given by the patient. Immediately prior to procedure a time out was called to verify the correct patient, procedure, equipment, support staff and site/side marked as required. Patient was prepped and draped in the usual sterile fashion.     - patient tolerated procedure well, discussed post-injection protocol - follow-up with Dr. Jerri as indicated; I am happy to see them as needed  Lonell Sprang, DO Primary Care Sports Medicine Physician  [facility] - [facility]oped[facility]  This note  was dictated using Dragon naturally speaking software and may contain errors in syntax, spelling, or content which have not been identified prior to signing this note.

## 2023-10-26 LAB — BASIC METABOLIC PANEL WITH GFR
BUN/Creatinine Ratio: 25 (ref 12–28)
BUN: 23 mg/dL (ref 8–27)
CO2: 16 mmol/L — ABNORMAL LOW (ref 20–29)
Calcium: 8.8 mg/dL (ref 8.7–10.3)
Chloride: 111 mmol/L — ABNORMAL HIGH (ref 96–106)
Creatinine, Ser: 0.93 mg/dL (ref 0.57–1.00)
Glucose: 119 mg/dL — ABNORMAL HIGH (ref 70–99)
Potassium: 4.1 mmol/L (ref 3.5–5.2)
Sodium: 145 mmol/L — ABNORMAL HIGH (ref 134–144)
eGFR: 66 mL/min/1.73 (ref >59)

## 2023-10-26 NOTE — Telephone Encounter (Signed)
Called and relayed to patient. °

## 2023-11-06 NOTE — Progress Notes (Signed)
 Assessment: 1.  History of cystitis, she seems infected today  2.  Urgency, urgency incontinence--OAB, persistent,  3.  Vaginal atrophic changes secondary to age, she is on estrogen in the perivaginal area  4.  Significant fecal leakage   Plan: 1.  I did write a prescription for pull-ups, I will be happy to fill out any forms if they come through to help pay for these  2.  Her urine was cultured today.  She will need eventual antibiotic  3.  I will start her on methenamine  hippurate, a gram twice a day after the above antibiotic has been completed  4.  I will see back in a couple of months   History of Present Illness:  First seen about 2 months ago for evaluation of recurrent urinary tract infections.  She has been  treated for 3 urinary tract infections within the past 6 months.  Her most recent urine culture from 1 month ago revealed mixed urogenital organisms.  Prior to that, on 29 January she grew Aerococcus, on January 5 her culture was negative.   The patient denies recent febrile UTIs.  Her symptoms typically include frequency, urgency, urgency incontinence.  In the past, she has been seen for this incontinence as well as UTIs by Dr. Keneth at [facility] u[facility].  She was last seen at [facility] urology in 2021.  At that time she was treated for urgency incontinence as well as recurrent urinary tract infections.  Her recent symptoms were treated with Cipro .  She developed C. difficile after that.  I believe that culture at the time of treatment was negative.  She still has urgency frequency and urgency incontinence.  She also states that at times she feels like urine is coming from her anus.  6.9.2025: Here today for follow-up.  She still feels like there is urine coming from her rectum occasionally.  Also, she does say that she has occasional pneumaturia.  No blood in her urine but she does have dysuria occasionally.  No recent cloudy urine.  She had cystoscopy which  revealed a normal-appearing bladder.  No evidence of fistula..  7.21.2025: Here for symptom check.  She is dysuria.  She also states that she has significant urine leakage from her anus.  She does have a GI evaluation under going.  She has a CT abdomen and pelvis with contrast at [facility] tomorrow.  She does go through 4-5 pull-ups a day for her intractable leakage. Past Medical History:  Past Medical History:  Diagnosis Date   Allergy 2010   Anemia    takes Ferrous Sulfate  daily   Anxiety    takes Citalopram  daily   Arthritis    Asthma    2004-prior to gastric bypass and no problems since   CAP (community acquired pneumonia) 07/23/2021   CHF (congestive heart failure) (HCC)    takes Lasix  daily as needed   Chronic back pain    spondylolisthesis/stenosis/radiculopathy   CKD (chronic kidney disease) stage 2, GFR 60-89 ml/min    Complication of anesthesia yrs ago   slow to wake up   Depression    Diabetes (HCC)    DVT (deep venous thrombosis) (HCC) 01/17/2013   past hx. -tx.5-6 yrs ago bilateral legs, occ. sporadic swelling, has IVC filter implanted   Dysrhythmia    heart tends to flutter   Fibromyalgia    Fracture    right foot and is in a cam boot   Gallstones    GERD (gastroesophageal  reflux disease)    hx of-no meds now   HCAP (healthcare-associated pneumonia) 02/19/2023   Heart murmur    History of bronchitis 2012 or 2013   History of colon polyps    Hypertension    takes Metoprolol  daily   Insomnia    takes Melatonin daily   Microalbuminuria    Pelvic floor dysfunction    Peripheral neuropathy    Pneumonia 90's   hx of   Recurrent Clostridioides difficile infection 02/28/2020   S/P gastric bypass 2003   Sleep apnea    no cpap used in many yrs after weight lost-no machine now   Urinary frequency    Urinary urgency    Vitamin D  deficiency     Past Surgical History:  Past Surgical History:  Procedure Laterality Date   BALLOON DILATION N/A 01/31/2013    Procedure: BALLOON DILATION;  Surgeon: Elsie Cree, MD;  Location: [facility];  Service: Endoscopy;  Laterality: N/A;   CARDIAC EVENT MONITOR  03/2019   Predominantly sinus rhythm.  Rates range from 48-124 bpm.  Average 74 bpm.  Frequent short bursts (3-15 beats) PAT/PSVT--not indicated as being symptomatic on diary.  Otherwise rare PACs and PVCs.   CARDIAC EVENT MONITOR  08/2021   14-Day Zio Patch: Predominantly sinus rhythm with rate range 45 to 135 bpm.  Average 73 bpm.  Occasional (1.3%) PACs with rare PVCs and rare PAC couplets.  24 episodes of atrial runs: Fastest was 11 beats at a rate of 184 bpm, longest was 13 beats with a rate of 92 bpm.  No sustained tachyarrhythmias or bradycardia arrhythmias.  No atrial fibrillation or flutter.   CARDIOPULMONARY EXERCISE STRESS TEST (CPX)  10/27/2021   Good effort despite dyspnea and wheezing.  Modified Naughton protocol on treadmill.  Normal heart rate response.  No sustained arrhythmias.  Preexercise spirometry normal.  Low normal peak VO2. Low normal functional capacity with NO Clear Cardiopulmonary Limitation.  Overall limitation primarily due to obesity & deconditioning, along with a component of Diastolic Dysfunction   CHOLECYSTECTOMY  2007   COLONOSCOPY     CT CTA CORONARY W/CA SCORE W/CM &/OR WO/CM  09/21/2019   Coronary Calcium  Score 0.  Normal RCA dominant coronary anatomy.  CAD RADS 1-minimal nonobstructive CAD (0-24%).  Consider nonatherosclerotic cause for chest pain.  Consider preventative therapy and risk factor modification.   DILATION AND CURETTAGE OF UTERUS  yrs ago   ESOPHAGOGASTRODUODENOSCOPY (EGD) WITH PROPOFOL   03/01/2012   Procedure: ESOPHAGOGASTRODUODENOSCOPY (EGD) WITH PROPOFOL ;  Surgeon: Elsie Cree, MD;  Location: [facility];  Service: Endoscopy;  Laterality: N/A;   ESOPHAGOGASTRODUODENOSCOPY (EGD) WITH PROPOFOL  N/A 01/31/2013   Procedure: ESOPHAGOGASTRODUODENOSCOPY (EGD) WITH PROPOFOL ;  Surgeon: Elsie Cree, MD;   Location: [facility];  Service: Endoscopy;  Laterality: N/A;   ESOPHAGOGASTRODUODENOSCOPY (EGD) WITH PROPOFOL  N/A 09/02/2015   Procedure: ESOPHAGOGASTRODUODENOSCOPY (EGD) WITH PROPOFOL ;  Surgeon: Lupita FORBES Commander, MD;  Location: [facility];  Service: Endoscopy;  Laterality: N/A;   FRACTURE SURGERY     GASTRIC BY-PASS  2004   HERNIA REPAIR  2005   INSERTION OF VENA CAVA FILTER  01/17/2013   inserted 2004- abdomen   LIGAMENT REPAIR Right 1987   Rt. knee scope   MAXIMUM ACCESS (MAS)POSTERIOR LUMBAR INTERBODY FUSION (PLIF) 2 LEVEL N/A 06/07/2014   Procedure: L4-5 L5-S1 FOR MAXIMUM ACCESS (MAS) POSTERIOR LUMBAR INTERBODY FUSION ;  Surgeon: Fairy Levels, MD;  Location: [facility];  Service: Neurosurgery;  Laterality: N/A;  L4-5 L5-S1 FOR MAXIMUM ACCESS (MAS) POSTERIOR LUMBAR  INTERBODY FUSION    SPINE SURGERY  2017   TONSILLECTOMY     as child   TRANSTHORACIC ECHOCARDIOGRAM  12/2020   EF 55 to 60%.  No RWMA.  Mild LVH.  GR 1 DD-moderate LA dilation..  Mild MR..  Normal RV size/function.  Normal PAP.  Normal RAP. => Essentially stable from December 2020    Allergies:  Allergies  Allergen Reactions   Macrobid  [Nitrofurantoin ] Other (See Comments)    C.Diff Infection   Carafate  [Sucralfate ] Hives, Itching and Other (See Comments)    Angioedema      Sulfonamide Derivatives Rash    Syncope   Infed  [Iron Dextran ]     Family History:  Family History  Problem Relation Age of Onset   Diabetes Mother    Atrial fibrillation Mother    Hypertension Mother    Arthritis Mother    Heart disease Father        Unsure of details   Hypertension Father    Prostate cancer Father    Alzheimer's disease Father    Arthritis Father    Cancer Father    Stroke Father    Healthy Sister    Kidney disease Brother    Cancer Brother    Diabetes Maternal Grandmother    Heart disease Maternal Grandmother    Vision loss Maternal Grandmother    Stroke Maternal Grandfather 50   Hypertension Maternal  Grandfather    Hypertension Paternal Grandmother    Alzheimer's disease Paternal Grandmother    Colon cancer Neg Hx    Colon polyps Neg Hx    Stomach cancer Neg Hx    Esophageal cancer Neg Hx    Pancreatic cancer Neg Hx    Liver disease Neg Hx     Social History:  Social History   Tobacco Use   Smoking status: Former    Current packs/day: 0.00    Average packs/day: 0.5 packs/day for 10.0 years (5.0 ttl pk-yrs)    Types: Cigarettes    Start date: 04/20/1999    Quit date: 04/19/2009    Years since quitting: 14.5   Smokeless tobacco: Never  Vaping Use   Vaping status: Never Used  Substance Use Topics   Alcohol  use: Not Currently    Comment: occ. social- wine-1 drink monthly   Drug use: No    Review of symptoms:  Constitutional:  Negative for unexplained weight loss, night sweats, fever, chills ENT:  Negative for nose bleeds, sinus pain, painful swallowing CV:  Negative for chest pain, shortness of breath, exercise intolerance, palpitations, loss of consciousness Resp:  Negative for cough, wheezing, shortness of breath GI:  Negative for nausea, vomiting, diarrhea, bloody stools GU:  Positives noted in HPI; otherwise negative for gross hematuria, dysuria, urinary incontinence Neuro:  Negative for seizures, poor balance, limb weakness, slurred speech Psych:  Negative for lack of energy, depression, anxiety Endocrine:  Negative for polydipsia, polyuria, symptoms of hypoglycemia (dizziness, hunger, sweating) Hematologic:  Negative for anemia, purpura, petechia, prolonged or excessive bleeding, use of anticoagulants  Allergic:  Negative for difficulty breathing or choking as a result of exposure to anything; no shellfish allergy; no allergic response (rash/itch) to materials, foods      Results:   I have reviewed prior patient's records  I have reviewed referring/prior physicians records--PC[facility] and [facility]  I have reviewed prior urine cultures  I reviewed prior  imaging studies-CT abdomen and pelvis with contrast in March of this year for diarrhea and left lower quadrant abdominal  pain revealed small left renal cyst, simple in nature.  No other GU abnormalities.  Most recent BMP reviewed, GFR 66

## 2023-11-07 ENCOUNTER — Ambulatory Visit: Admitting: Urology

## 2023-11-07 ENCOUNTER — Ambulatory Visit: Admitting: Internal Medicine

## 2023-11-07 VITALS — BP 142/83 | HR 94 | Ht 65.0 in | Wt 260.0 lb

## 2023-11-07 DIAGNOSIS — R350 Frequency of micturition: Secondary | ICD-10-CM | POA: Diagnosis not present

## 2023-11-07 DIAGNOSIS — N281 Cyst of kidney, acquired: Secondary | ICD-10-CM | POA: Diagnosis not present

## 2023-11-07 DIAGNOSIS — R32 Unspecified urinary incontinence: Secondary | ICD-10-CM

## 2023-11-07 DIAGNOSIS — N3941 Urge incontinence: Secondary | ICD-10-CM | POA: Diagnosis not present

## 2023-11-07 DIAGNOSIS — N952 Postmenopausal atrophic vaginitis: Secondary | ICD-10-CM

## 2023-11-07 DIAGNOSIS — R159 Full incontinence of feces: Secondary | ICD-10-CM

## 2023-11-07 DIAGNOSIS — N3020 Other chronic cystitis without hematuria: Secondary | ICD-10-CM | POA: Diagnosis not present

## 2023-11-07 LAB — MICROSCOPIC EXAMINATION: WBC, UA: >30 /HPF — AB (ref 0–5)

## 2023-11-07 LAB — URINALYSIS, ROUTINE W REFLEX MICROSCOPIC
Bilirubin, UA: NEGATIVE
Glucose, UA: NEGATIVE
Ketones, UA: NEGATIVE
Nitrite, UA: POSITIVE — AB
Specific Gravity, UA: 1.025 (ref 1.005–1.030)
Urobilinogen, Ur: 0.2 mg/dL (ref 0.2–1.0)
pH, UA: 6 (ref 5.0–7.5)

## 2023-11-07 MED ORDER — METHENAMINE HIPPURATE 1 G PO TABS: 1.0000 g | ORAL_TABLET | Freq: Two times a day (BID) | ORAL | 11 refills | Status: AC

## 2023-11-08 ENCOUNTER — Encounter: Payer: Self-pay | Admitting: Urology

## 2023-11-08 DIAGNOSIS — N321 Vesicointestinal fistula: Secondary | ICD-10-CM | POA: Diagnosis not present

## 2023-11-08 DIAGNOSIS — N3289 Other specified disorders of bladder: Secondary | ICD-10-CM | POA: Diagnosis not present

## 2023-11-09 DIAGNOSIS — J4540 Moderate persistent asthma, uncomplicated: Secondary | ICD-10-CM | POA: Diagnosis not present

## 2023-11-09 LAB — URINE CULTURE

## 2023-11-10 ENCOUNTER — Encounter: Payer: Self-pay | Admitting: Urology

## 2023-11-11 ENCOUNTER — Other Ambulatory Visit: Payer: Self-pay | Admitting: Urology

## 2023-11-11 ENCOUNTER — Ambulatory Visit: Payer: Self-pay | Admitting: Urology

## 2023-11-11 DIAGNOSIS — N3020 Other chronic cystitis without hematuria: Secondary | ICD-10-CM

## 2023-11-11 MED ORDER — CEPHALEXIN 500 MG PO CAPS: 500.0000 mg | ORAL_CAPSULE | Freq: Two times a day (BID) | ORAL | 0 refills | Status: DC

## 2023-11-14 ENCOUNTER — Ambulatory Visit: Admitting: Internal Medicine

## 2023-11-15 ENCOUNTER — Ambulatory Visit: Attending: Internal Medicine | Admitting: Nurse Practitioner

## 2023-11-15 ENCOUNTER — Encounter: Payer: Self-pay | Admitting: Nurse Practitioner

## 2023-11-15 VITALS — BP 126/80 | HR 71 | Ht 65.0 in | Wt 257.2 lb

## 2023-11-15 DIAGNOSIS — R0609 Other forms of dyspnea: Secondary | ICD-10-CM | POA: Diagnosis not present

## 2023-11-15 DIAGNOSIS — I5032 Chronic diastolic (congestive) heart failure: Secondary | ICD-10-CM

## 2023-11-15 DIAGNOSIS — G4733 Obstructive sleep apnea (adult) (pediatric): Secondary | ICD-10-CM | POA: Diagnosis not present

## 2023-11-15 DIAGNOSIS — I10 Essential (primary) hypertension: Secondary | ICD-10-CM

## 2023-11-15 DIAGNOSIS — Z86718 Personal history of other venous thrombosis and embolism: Secondary | ICD-10-CM | POA: Diagnosis not present

## 2023-11-15 DIAGNOSIS — E785 Hyperlipidemia, unspecified: Secondary | ICD-10-CM

## 2023-11-15 DIAGNOSIS — R002 Palpitations: Secondary | ICD-10-CM

## 2023-11-15 DIAGNOSIS — I340 Nonrheumatic mitral (valve) insufficiency: Secondary | ICD-10-CM | POA: Diagnosis not present

## 2023-11-15 MED ORDER — METOPROLOL TARTRATE 100 MG PO TABS: ORAL_TABLET | ORAL | 0 refills | Status: DC

## 2023-11-15 NOTE — Patient Instructions (Signed)
 Medication Instructions:  Metoprolol  100 mg take 1 tablet 2 hours prior to procedure   *If you need a refill on your cardiac medications before your next appointment, please call your pharmacy*  Lab Work: BMET 1 week prior to procedure  Testing/Procedures:   Your cardiac CT will be scheduled at one of the below locations:    [facility] [address]  [place], [place] [address]   If scheduled at the [facility] at [address], please enter the parking lot using the [address] entrance and use the FREE valet service at the patient drop-off area. Enter the building and check-in with registration on the main floor.  Please follow these instructions carefully (unless otherwise directed):  An IV will be required for this test and Nitroglycerin  will be given.  Hold all erectile dysfunction medications at least 3 days (72 hrs) prior to test. (Ie viagra, cialis, sildenafil, tadalafil, etc)   On the Night Before the Test: Be sure to Drink plenty of water. Do not consume any caffeinated/decaffeinated beverages or chocolate 12 hours prior to your test. Do not take any antihistamines 12 hours prior to your test. If the patient has contrast allergy: Patient will need a prescription for Prednisone  and very clear instructions (as follows): Prednisone  50 mg - take 13 hours prior to test Take another Prednisone  50 mg 7 hours prior to test Take another Prednisone  50 mg 1 hour prior to test Take Benadryl  50 mg 1 hour prior to test Patient must complete all four doses of above prophylactic medications. Patient will need a ride after test due to Benadryl .  On the Day of the Test: Drink plenty of water until 1 hour prior to the test. Do not eat any food 1 hour prior to test. You may take your regular medications prior to the test.  Take metoprolol  (Lopressor ) two hours prior to test. If you take  Furosemide /Hydrochlorothiazide /Spironolactone/Chlorthalidone, please HOLD on the morning of the test. Patients who wear a continuous glucose monitor MUST remove the device prior to scanning. FEMALES- please wear underwire-free bra if available, avoid dresses & tight clothing       After the Test: Drink plenty of water. After receiving IV contrast, you may experience a mild flushed feeling. This is normal. On occasion, you may experience a mild rash up to 24 hours after the test. This is not dangerous. If this occurs, you can take Benadryl  25 mg, Zyrtec, Claritin , or Allegra and increase your fluid intake. (Patients taking Tikosyn should avoid Benadryl , and may take Zyrtec, Claritin , or Allegra) If you experience trouble breathing, this can be serious. If it is severe call 911 IMMEDIATELY. If it is mild, please call our office.  We will call to schedule your test 2-4 weeks out understanding that some insurance companies will need an authorization prior to the service being performed.   For more information and frequently asked questions, please visit our website : [email or web address]  For non-scheduling related questions, please contact the cardiac imaging nurse navigator should you have any questions/concerns: Cardiac Imaging Nurse Navigators Direct Office Dial: [phone]   For scheduling needs, including cancellations and rescheduling, please call [place], [phone].   Follow-Up: At [facility], you and your health needs are our priority.  As part of our continuing mission to provide you with exceptional heart care, our providers are all part of one team.  This team includes your primary Cardiologist (physician) and Advanced Practice Providers or APPs (Physician  Assistants and Nurse Practitioners) who all work together to provide you with the care you need, when you need it.  Your next appointment:   6-8 week(s)  Provider:   Alm Clay, MD or Damien Braver, NP          We recommend signing up for the patient portal called MyChart.  Sign up information is provided on this After Visit Summary.  MyChart is used to connect with patients for Virtual Visits (Telemedicine).  Patients are able to view lab/test results, encounter notes, upcoming appointments, etc.  Non-urgent messages can be sent to your provider as well.   To learn more about what you can do with MyChart, go to [email or web address].   Other Instructions

## 2023-11-15 NOTE — Progress Notes (Unsigned)
 Office Visit    Patient Name: [name] Date of Encounter: 11/15/2023  Primary Care Provider:  Sherlynn Madden, MD Primary Cardiologist:  Alm Clay, MD  Chief Complaint    68 year old female with a history of minimal minimal nonobstructive CAD, PSVT, chronic diastolic heart failure, mild mitral valve regurgitation, mild dilation of the ascending aorta, hypertension, bilateral DVTs s/p IVC filter in 2009, OSA, and obesity who presents for follow-up related to CAD and heart failure.    Past Medical History    Past Medical History:  Diagnosis Date   Allergy 2010   Anemia    takes Ferrous Sulfate  daily   Anxiety    takes Citalopram  daily   Arthritis    Asthma    2004-prior to gastric bypass and no problems since   CAP (community acquired pneumonia) 07/23/2021   CHF (congestive heart failure) (HCC)    takes Lasix  daily as needed   Chronic back pain    spondylolisthesis/stenosis/radiculopathy   CKD (chronic kidney disease) stage 2, GFR 60-89 ml/min    Complication of anesthesia yrs ago   slow to wake up   Depression    Diabetes (HCC)    DVT (deep venous thrombosis) (HCC) 01/17/2013   past hx. -tx.5-6 yrs ago bilateral legs, occ. sporadic swelling, has IVC filter implanted   Dysrhythmia    heart tends to flutter   Fibromyalgia    Fracture    right foot and is in a cam boot   Gallstones    GERD (gastroesophageal reflux disease)    hx of-no meds now   HCAP (healthcare-associated pneumonia) 02/19/2023   Heart murmur    History of bronchitis 2012 or 2013   History of colon polyps    Hypertension    takes Metoprolol  daily   Insomnia    takes Melatonin daily   Microalbuminuria    Pelvic floor dysfunction    Peripheral neuropathy    Pneumonia 90's   hx of   Recurrent Clostridioides difficile infection 02/28/2020   S/P gastric bypass 2003   Sleep apnea    no cpap used in many yrs after weight lost-no machine now   Urinary frequency    Urinary  urgency    Vitamin D  deficiency    Past Surgical History:  Procedure Laterality Date   BALLOON DILATION N/A 01/31/2013   Procedure: BALLOON DILATION;  Surgeon: Elsie Cree, MD;  Location: [facility];  Service: Endoscopy;  Laterality: N/A;   CARDIAC EVENT MONITOR  03/2019   Predominantly sinus rhythm.  Rates range from 48-124 bpm.  Average 74 bpm.  Frequent short bursts (3-15 beats) PAT/PSVT--not indicated as being symptomatic on diary.  Otherwise rare PACs and PVCs.   CARDIAC EVENT MONITOR  08/2021   14-Day Zio Patch: Predominantly sinus rhythm with rate range 45 to 135 bpm.  Average 73 bpm.  Occasional (1.3%) PACs with rare PVCs and rare PAC couplets.  24 episodes of atrial runs: Fastest was 11 beats at a rate of 184 bpm, longest was 13 beats with a rate of 92 bpm.  No sustained tachyarrhythmias or bradycardia arrhythmias.  No atrial fibrillation or flutter.   CARDIOPULMONARY EXERCISE STRESS TEST (CPX)  10/27/2021   Good effort despite dyspnea and wheezing.  Modified Naughton protocol on treadmill.  Normal heart rate response.  No sustained arrhythmias.  Preexercise spirometry normal.  Low normal peak VO2. Low normal functional capacity with NO Clear Cardiopulmonary Limitation.  Overall limitation primarily due to obesity & deconditioning, along with a  component of Diastolic Dysfunction   CHOLECYSTECTOMY  2007   COLONOSCOPY     CT CTA CORONARY W/CA SCORE W/CM &/OR WO/CM  09/21/2019   Coronary Calcium  Score 0.  Normal RCA dominant coronary anatomy.  CAD RADS 1-minimal nonobstructive CAD (0-24%).  Consider nonatherosclerotic cause for chest pain.  Consider preventative therapy and risk factor modification.   DILATION AND CURETTAGE OF UTERUS  yrs ago   ESOPHAGOGASTRODUODENOSCOPY (EGD) WITH PROPOFOL   03/01/2012   Procedure: ESOPHAGOGASTRODUODENOSCOPY (EGD) WITH PROPOFOL ;  Surgeon: Elsie Cree, MD;  Location: [facility];  Service: Endoscopy;  Laterality: N/A;   ESOPHAGOGASTRODUODENOSCOPY  (EGD) WITH PROPOFOL  N/A 01/31/2013   Procedure: ESOPHAGOGASTRODUODENOSCOPY (EGD) WITH PROPOFOL ;  Surgeon: Elsie Cree, MD;  Location: [facility];  Service: Endoscopy;  Laterality: N/A;   ESOPHAGOGASTRODUODENOSCOPY (EGD) WITH PROPOFOL  N/A 09/02/2015   Procedure: ESOPHAGOGASTRODUODENOSCOPY (EGD) WITH PROPOFOL ;  Surgeon: Lupita FORBES Commander, MD;  Location: [facility];  Service: Endoscopy;  Laterality: N/A;   FRACTURE SURGERY     GASTRIC BY-PASS  2004   HERNIA REPAIR  2005   INSERTION OF VENA CAVA FILTER  01/17/2013   inserted 2004- abdomen   LIGAMENT REPAIR Right 1987   Rt. knee scope   MAXIMUM ACCESS (MAS)POSTERIOR LUMBAR INTERBODY FUSION (PLIF) 2 LEVEL N/A 06/07/2014   Procedure: L4-5 L5-S1 FOR MAXIMUM ACCESS (MAS) POSTERIOR LUMBAR INTERBODY FUSION ;  Surgeon: Fairy Levels, MD;  Location: [facility];  Service: Neurosurgery;  Laterality: N/A;  L4-5 L5-S1 FOR MAXIMUM ACCESS (MAS) POSTERIOR LUMBAR INTERBODY FUSION    SPINE SURGERY  2017   TONSILLECTOMY     as child   TRANSTHORACIC ECHOCARDIOGRAM  12/2020   EF 55 to 60%.  No RWMA.  Mild LVH.  GR 1 DD-moderate LA dilation..  Mild MR..  Normal RV size/function.  Normal PAP.  Normal RAP. => Essentially stable from December 2020    Allergies  Allergies  Allergen Reactions   Macrobid  [Nitrofurantoin ] Other (See Comments)    C.Diff Infection   Carafate  [Sucralfate ] Hives, Itching and Other (See Comments)    Angioedema      Sulfonamide Derivatives Rash    Syncope   Infed  [Iron Dextran ]      Labs/Other Studies Reviewed    The following studies were reviewed today:  Cardiac Studies & Procedures   ______________________________________________________________________________________________     ECHOCARDIOGRAM  ECHOCARDIOGRAM COMPLETE 01/21/2023  Narrative ECHOCARDIOGRAM REPORT    Patient Name:   [name] Date of Exam: 01/21/2023 Medical Rec #:  [id number]           Height:       65.0 in Accession #:     [id number]          Weight:       247.0 lb Date of Birth:  September 24, 1955           BSA:          2.164 m Patient Age:    67 years            BP:           144/110 mmHg Patient Gender: F                   HR:           70 bpm. Exam Location:  Inpatient  Procedure: 2D Echo, Cardiac Doppler and Color Doppler  Indications:    TIA G45.9  History:        Patient has prior history of Echocardiogram examinations, most  recent 01/14/2021. COVID; Risk Factors:Hypertension, Diabetes and Former Smoker.  Sonographer:    Tillman Nora RVT RCS Referring Phys: [phone] MCNEILL P KIRKPATRICK   Sonographer Comments: Technically challenging study due to limited acoustic windows, Technically difficult study due to poor echo windows, suboptimal parasternal window and suboptimal apical window. Image acquisition challenging due to patient body habitus. Unable to complete ECHO; patient unable to tolerate probe pressure. IMPRESSIONS   1. Left ventricular ejection fraction, by estimation, is 60 to 65%. The left ventricle has normal function. The left ventricle has no regional wall motion abnormalities. Left ventricular diastolic parameters are consistent with Grade I diastolic dysfunction (impaired relaxation). 2. Right ventricular systolic function is normal. The right ventricular size is normal. 3. The mitral valve is normal in structure. Trivial mitral valve regurgitation. No evidence of mitral stenosis. 4. Tricuspid valve regurgitation is mild to moderate. 5. The aortic valve has an indeterminant number of cusps. Aortic valve regurgitation is not visualized. No aortic stenosis is present. 6. There is borderline dilatation of the aortic root, measuring 37 mm. 7. The inferior vena cava is normal in size with greater than 50% respiratory variability, suggesting right atrial pressure of 3 mmHg.  FINDINGS Left Ventricle: Left ventricular ejection fraction, by estimation, is 60 to 65%. The left ventricle has normal  function. The left ventricle has no regional wall motion abnormalities. The left ventricular internal cavity size was normal in size. There is no left ventricular hypertrophy. Left ventricular diastolic parameters are consistent with Grade I diastolic dysfunction (impaired relaxation).  Right Ventricle: The right ventricular size is normal. No increase in right ventricular wall thickness. Right ventricular systolic function is normal.  Left Atrium: Left atrial size was normal in size.  Right Atrium: Right atrial size was normal in size.  Pericardium: There is no evidence of pericardial effusion. Presence of epicardial fat layer.  Mitral Valve: The mitral valve is normal in structure. Trivial mitral valve regurgitation. No evidence of mitral valve stenosis.  Tricuspid Valve: The tricuspid valve is normal in structure. Tricuspid valve regurgitation is mild to moderate. No evidence of tricuspid stenosis.  Aortic Valve: The aortic valve has an indeterminant number of cusps. Aortic valve regurgitation is not visualized. No aortic stenosis is present. Aortic valve mean gradient measures 3.0 mmHg. Aortic valve peak gradient measures 6.2 mmHg. Aortic valve area, by VTI measures 2.64 cm.  Pulmonic Valve: The pulmonic valve was not well visualized. Pulmonic valve regurgitation is not visualized. No evidence of pulmonic stenosis.  Aorta: There is borderline dilatation of the aortic root, measuring 37 mm.  Venous: The inferior vena cava is normal in size with greater than 50% respiratory variability, suggesting right atrial pressure of 3 mmHg.  IAS/Shunts: No atrial level shunt detected by color flow Doppler.   LEFT VENTRICLE PLAX 2D LVIDd:         5.20 cm   Diastology LVIDs:         3.50 cm   LV e' medial:    5.48 cm/s LV PW:         0.90 cm   LV E/e' medial:  9.5 LV IVS:        1.00 cm   LV e' lateral:   6.53 cm/s LVOT diam:     2.10 cm   LV E/e' lateral: 8.0 LV SV:         80 LV SV Index:    37 LVOT Area:     3.46 cm   RIGHT VENTRICLE RV S  prime:     9.30 cm/s TAPSE (M-mode): 1.7 cm  LEFT ATRIUM             Index        RIGHT ATRIUM           Index LA diam:        4.40 cm 2.03 cm/m   RA Area:     16.60 cm LA Vol (A2C):   72.0 ml 33.28 ml/m  RA Volume:   44.90 ml  20.75 ml/m LA Vol (A4C):   58.0 ml 26.80 ml/m LA Biplane Vol: 65.0 ml 30.04 ml/m AORTIC VALVE                    PULMONIC VALVE AV Area (Vmax):    2.61 cm     PV Vmax:          0.86 m/s AV Area (Vmean):   2.59 cm     PV Peak grad:     2.9 mmHg AV Area (VTI):     2.64 cm     PR End Diast Vel: 6.45 msec AV Vmax:           124.00 cm/s AV Vmean:          82.800 cm/s AV VTI:            0.302 m AV Peak Grad:      6.2 mmHg AV Mean Grad:      3.0 mmHg LVOT Vmax:         93.60 cm/s LVOT Vmean:        62.000 cm/s LVOT VTI:          0.230 m LVOT/AV VTI ratio: 0.76  AORTA Ao Root diam: 3.70 cm Ao Asc diam:  3.50 cm  MITRAL VALVE MV Area (PHT): 2.80 cm    SHUNTS MV Decel Time: 271 msec    Systemic VTI:  0.23 m MV E velocity: 52.10 cm/s  Systemic Diam: 2.10 cm MV A velocity: 70.60 cm/s MV E/A ratio:  0.74  Kardie Tobb DO Electronically signed by Dub Huntsman DO Signature Date/Time: 01/21/2023/3:05:25 PM    Final    MONITORS  LONG TERM MONITOR (3-14 DAYS) 09/23/2021  Narrative   Zio Patch Wear Time:  8 days and 12 hours (2023-05-18T07:53:20-0400 to 2023-05-26T20:45:22-0400)   Predominant Underlying Rhythm Was Sinus Rhythm: Heart rate range 45 to 135 bpm.  Average 73 bpm.   24 episodes of Supraventricular Tachycardia (SVT)/Atrial Tachycardia (atrial runs): Fastest was 11 beats at 184 bpm, longest was 13 beats at an average rate 92 bpm. => Episodes were noted on log.   Occasional PACs (1.3%) with rare couplets and triplets.  Rare PVCs.   Relatively benign findings.  Reviewed in clinic.  Alm Clay, MD   CT SCANS  CT CORONARY MORPH W/CTA COR W/SCORE 09/21/2019  Addendum 09/21/2019  6:16  PM ADDENDUM REPORT: 09/21/2019 18:14  CLINICAL DATA:  68 year old female with h/o hypertension, hyperlipidemia and dyspnea on exertion.  EXAM: Cardiac/Coronary  CTA  TECHNIQUE: The patient was scanned on a [employer].  FINDINGS: A 100 kV prospective scan was triggered in the descending thoracic aorta at 111 HU's. Axial non-contrast 3 mm slices were carried out through the heart. The data set was analyzed on a dedicated work station and scored using the Agatson method. Gantry rotation speed was 250 msecs and collimation was .6 mm. No beta blockade and 0.8 mg of sl NTG was given. The 3D data set  was reconstructed in 5% intervals of the 67-82 % of the R-R cycle. Diastolic phases were analyzed on a dedicated work station using MPR, MIP and VRT modes. The patient received 80 cc of contrast.  Aorta:  Normal size.  No calcifications.  No dissection.  Aortic Valve:  Trileaflet.  No calcifications.  Coronary Arteries:  Normal coronary origin.  Left dominance.  RCA is a small non-dominant artery.  Left main is a large artery that gives rise to LAD and LCX arteries. Left main has no plaque.  LAD is a large vessel that gives rise to one large diagonal artery and wraps around the apex. Proximal LAD has mild diffuse calcified plaque with stenosis 0-25%. Mid and distal LAD has only luminal irregularities.  D1 is a large artery with minimal plaque.  LCX is a large lumen dominant artery that gives rise to two large OM branches, PDA and PLA. There is minimal plaque.  OM1, OM2 are large branches with minimal irregularities.  PDA, PLA have minimal plaque.  Other findings:  Normal pulmonary vein drainage into the left atrium.  Normal left atrial appendage without a thrombus.  Normal size of the pulmonary artery.  IMPRESSION: 1. Coronary calcium  score of 117. This was 42 percentile for age and sex matched control.  2. Normal coronary origin with left  dominance.  3.  EXAM: Cardiac/Coronary  CTA  TECHNIQUE: The patient was scanned on a [employer].  FINDINGS: A 100 kV prospective scan was triggered in the descending thoracic aorta at 111 HU's. Axial non-contrast 3 mm slices were carried out through the heart. The data set was analyzed on a dedicated work station and scored using the Agatson method. Gantry rotation speed was 250 msecs and collimation was .6 mm. No beta blockade and 0.8 mg of sl NTG was given. The 3D data set was reconstructed in 5% intervals of the 67-82 % of the R-R cycle. Diastolic phases were analyzed on a dedicated work station using MPR, MIP and VRT modes. The patient received 80 cc of contrast.  Aorta:  Normal size.  No calcifications.  No dissection.  Aortic Valve:  Trileaflet.  No calcifications.  Coronary Arteries:  Normal coronary origin.  Right dominance.  RCA is a large dominant artery that gives rise to PDA and PLA. There is no plaque.  Left main is a large artery that gives rise to LAD and LCX arteries.  LAD is a large vessel that has no plaque.  LCX is a non-dominant artery that gives rise to one large OM1 branch. There is no plaque.  Other findings:  Normal pulmonary vein drainage into the left atrium.  Normal left atrial appendage without a thrombus.  Normal size of the pulmonary artery.  IMPRESSION: 1. Coronary calcium  score of 0. This was 0 percentile for age and sex matched control.  2. Normal coronary origin with right dominance.  3. CAD-RADS 1. Minimal non-obstructive CAD (0-24%). Consider non-atherosclerotic causes of chest pain. Consider preventive therapy and risk factor modification.   Electronically Signed By: Leim Moose On: 09/21/2019 18:14  Narrative EXAM: OVER-READ INTERPRETATION  CT CHEST  The following report is an over-read performed by radiologist Dr. Franky Crease of [facility] on 09/21/2019. This over-read does not  include interpretation of cardiac or coronary anatomy or pathology. The coronary CTA interpretation by the cardiologist is attached.  COMPARISON:  12/10/2016  FINDINGS: Vascular: Heart is normal size.  Aorta normal caliber.  Mediastinum/Nodes: No adenopathy in the lower mediastinum  or hila.  Lungs/Pleura: Lingular scarring. No confluent opacities or effusions.  Upper Abdomen: Imaging into the upper abdomen shows no acute findings.  Musculoskeletal: Chest wall soft tissues are unremarkable. No acute bony abnormality.  IMPRESSION: No acute or significant extracardiac abnormality.  Electronically Signed: By: Franky Crease M.D. On: 09/21/2019 16:03     ______________________________________________________________________________________________     Recent Labs: 01/15/2023: TSH 0.396 01/22/2023: Magnesium 2.0 06/28/2023: B Natriuretic Peptide 108.2; Platelets 269 10/04/2023: ALT 29; Hemoglobin 11.6 10/25/2023: BUN 23; Creatinine, Ser 0.93; Potassium 4.1; Sodium 145  Recent Lipid Panel    Component Value Date/Time   CHOL 156 01/21/2023 0650   TRIG 96 01/21/2023 0650   TRIG 70 02/18/2006 0933   HDL 57 01/21/2023 0650   CHOLHDL 2.7 01/21/2023 0650   VLDL 19 01/21/2023 0650   LDLCALC 80 01/21/2023 0650    History of Present Illness    68 year old female with the above past medical history including minimal nonobstructive CAD, PSVT, chronic diastolic heart failure, mild mitral valve regurgitation, mild dilation of the ascending aorta, hypertension, bilateral DVTs s/p IVC filter in 2009, OSA, and obesity.   Coronary CT angiogram in 2021 in the setting of chest tightness, shortness of breath, revealed coronary calcium  score 117 (83rd percentile), minimal nonobstructive CAD. Echocardiogram in 2022 showed EF 55 to 60%, grade 1 diastolic function, mildly dilated left atrium, mild mitral valve regurgitation, mild dilation of ascending aorta measuring 39 mm.  14-day Zio patch in 2023 in  the setting of palpitations showed 24 episodes of SVT, longest episode lasting 13 beats, no evidence of atrial fibrillation.  CPX in 2023 showed low normal functional capacity, no clear cardiopulmonary limitation.  Echocardiogram in 01/2023 showed EF 60 to 65%, no RWMA, G1 DD, normal RV systolic function.  She was evaluated in the ED on 06/28/2023 in the setting of shortness of breath.  She also noted symptoms of cough, general malaise, diarrhea and abdominal pain. CT of the abdomen pelvis was negative for acute process..  Chest x-ray revealed mild pulmonary edema.  BNP was minimally elevated at 108.  She had been recently started on Cipro  for UTI.  She was advised to follow-up with her PCP to rule out possible C. difficile infection.  She was treated with a course of azithromycin , prednisone , Tessalon  Perles her symptoms were felt to be due to mild bronchitis. She was discharged home in stable condition.  She was last in office on 07/12/2023 and was stable overall from a cardiac standpoint.  She did note some mild dyspnea on exertion, abdominal fullness.  She was instructed to take an additional torsemide  10 mg daily as needed.  ABIs in the setting of bilateral leg pain revealed no evidence for peripheral arterial disease.  She was last seen in the office on 10/03/2023 and was stable from a cardiac standpoint.  She did report mildly increased dyspnea on exertion, increased fatigue.  Torsemide  was increased to 20 mg daily.  It was noted that should her symptoms persist despite a dilutional diuresis, ischemic evaluation could considered.   She presents today for follow-up accompanied by her sister. Since her last visit she has She continues to note dyspnea on exertion, generalized fatigue, intermittent chest tightness at rest.  She has been under significant stress, history of tobacco use.  Has followed with pulmonology, asthma, reflux, allergies.  Will check echocardiogram and progressive dyspnea on exertion.  Will  update coronary CT angiogram.  BMET, take metoprolol  100 mg prior to procedure.  Follow-up in  6 to 8 weeks, sooner if needed.  If cardiac workup unrevealing, consider follow-up with pulmonology.  No improvement with increased diuresis.  1. Chronic diastolic heart failure/shortness of breath: Echocardiogram in 01/2023 showed EF 60 to 65%, no RWMA, G1 DD, normal RV systolic function.  She reports ongoing dyspnea on exertion, intermittent bilateral lower extremity edema, weight fluctuation. Will increase torsemide  to 20 mg daily.  She will take potassium 20 mill equivalents daily.  Will check BMET today.  Will repeat BMET in 7 to 10 days. Reviewed ongoing monitoring with daily weights, sodium and fluid recommendations.    2. Nonobstructive CAD: Coronary CT angiogram in 2021 in the setting of chest tightness, shortness of breath, revealed coronary calcium  score 117 (83rd percentile), minimal nonobstructive CAD.  Recent increase in dyspnea on exertion, fatigue.  Recent echo reassuring.  Should symptoms persist despite additional diuresis, consider need for ischemic evaluation (consider coronary CT angiogram).  She is not on statin therapy.  We discussed possible initiation of statin therapy in office today, will revisit at follow-up.  Continue aspirin , amlodipine .   3. Palpitations/PSVT: 14-day Zio patch in 2023 in the setting of palpitations showed 24 episodes of SVT, longest episode lasting 13 beats, no evidence of atrial fibrillation. Denies any recent palpitations.    4. Mild mitral valve regurgitation: Not appreciated on most recent echo.    5. Mild dilation of the ascending aorta: Noted on prior CT, not appreciated on most recent echo.   6. Hypertension: BP well controlled. Continue current antihypertensive regimen.    7. Hyperlipidemia: LDL was 80 in 01/2023, above goal.  She is not on any lipid-lowering therapy at this time. We discussed possible initiation of statin therapy.  She did have a recent  elevation in liver enzymes, would need close monitoring.  Alternatively, could consider referral to Pharm.D. for PCSK9 inhibitor.  Will defer for no, can revisit at follow-up.     8. History of bilateral DVTs: S/p IVC filter.    9. OSA: She is pending repeat sleep study.  Followed by pulmonology.   10.  Bilateral leg pain:  ABIs in 2017 and 07/2023 were normal.  Denies worsening symptoms.   11.  Disposition: Follow-up i  Home Medications    Current Outpatient Medications  Medication Sig Dispense Refill   acetaminophen  (TYLENOL ) 650 MG CR tablet Take 650 mg by mouth every 8 (eight) hours as needed for pain.     albuterol  (VENTOLIN  HFA) 108 (90 Base) MCG/ACT inhaler INHALE 2 PUFFS INTO THE LUNGS EVERY 6 HOURS AS NEEDED FOR WHEEZING OR SHORTNESS OF BREATH 3 each 2   amLODipine  (NORVASC ) 10 MG tablet Take 1 tablet (10 mg total) by mouth daily. 90 tablet 3   Ascorbic Acid  (VITAMIN C ) 1000 MG tablet Take 1,000 mg by mouth daily.     aspirin  EC 81 MG tablet Take 1 tablet (81 mg total) by mouth daily. Swallow whole. 30 tablet 12   azelastine  (ASTELIN ) 0.1 % nasal spray Place 1 spray into both nostrils 2 (two) times daily. Use in each nostril as directed 30 mL 12   benzonatate  (TESSALON ) 100 MG capsule Take 1 capsule (100 mg total) by mouth every 8 (eight) hours as needed for cough. 45 capsule 3   budesonide  (PULMICORT ) 0.25 MG/2ML nebulizer solution Take 2 mLs (0.25 mg total) by nebulization in the morning and at bedtime. 120 mL 11   buPROPion  (WELLBUTRIN  XL) 300 MG 24 hr tablet TAKE 1 TABLET(300 MG) BY MOUTH DAILY 90 tablet  3   cephALEXin  (KEFLEX ) 500 MG capsule Take 1 capsule (500 mg total) by mouth 2 (two) times daily. 10 capsule 0   estradiol  (ESTRACE ) 0.1 MG/GM vaginal cream USE A PEA SIZED AMOUNT ON YOUR INDEX FINGER AND APPLY TO VAGINAL OPENING 2 NIGHTS A WEEK 42.5 g 3   ferrous sulfate  325 (65 FE) MG tablet Take 1 tablet (325 mg total) by mouth daily with breakfast. 30 tablet 3   gabapentin   (NEURONTIN ) 100 MG capsule TAKE 2 CAPSULES(200 MG) BY MOUTH TWICE DAILY 180 capsule 0   ipratropium (ATROVENT ) 0.02 % nebulizer solution Take 2.5 mLs (0.5 mg total) by nebulization every 6 (six) hours as needed for wheezing or shortness of breath. 75 mL 12   lipase/protease/amylase (CREON ) 36000 UNITS CPEP capsule Take 36,000 Units by mouth 4 (four) times daily as needed (Stomach Pain).     loperamide  (IMODIUM ) 2 MG capsule Take 1 capsule (2 mg total) by mouth as needed for diarrhea or loose stools. 30 capsule 0   methenamine  (HIPREX ) 1 g tablet Take 1 tablet (1 g total) by mouth 2 (two) times daily with a meal. Start after antibiotic completed (antibiotic will be called in once culture result is back) 60 tablet 11   montelukast  (SINGULAIR ) 10 MG tablet Take 1 tablet (10 mg total) by mouth at bedtime. 90 tablet 3   Multiple Vitamin (MULTI-VITAMIN) tablet Take 1 tablet by mouth daily.     nystatin  cream (MYCOSTATIN ) Apply 1 Application topically 2 (two) times daily. 60 g 3   pantoprazole  (PROTONIX ) 40 MG tablet Take 1 tablet (40 mg total) by mouth 2 (two) times daily before a meal. 60 tablet 3   PARoxetine  (PAXIL ) 20 MG tablet Take 1 tablet (20 mg total) by mouth daily. 90 tablet 3   potassium chloride  SA (KLOR-CON  M) 20 MEQ tablet Take 1 tablet (20 mEq total) by mouth 2 (two) times daily. 180 tablet 3   torsemide  (DEMADEX ) 20 MG tablet Take 20 mg by mouth daily.     traMADol  (ULTRAM ) 50 MG tablet Take 1 tablet (50 mg total) by mouth every 6 (six) hours as needed for severe pain (pain score 7-10). 60 tablet 2   Vibegron  (GEMTESA ) 75 MG TABS Take 1 tablet (75 mg total) by mouth daily. 30 tablet 11   VITAMIN D  PO Take 1 capsule by mouth daily.     No current facility-administered medications for this visit.     Review of Systems    ***.  All other systems reviewed and are otherwise negative except as noted above.    Physical Exam    VS:  BP 126/80 (BP Location: Left Arm, Patient Position:  Sitting, Cuff Size: Large)   Pulse 71   Ht 5' 5 (1.651 m)   Wt 257 lb 3.2 oz (116.7 kg)   SpO2 95%   BMI 42.80 kg/m  STOP-Bang Score:  7  { Consider Dx Sleep Disordered Breathing or Sleep Apnea  ICD G47.33          :1}    GEN: Well nourished, well developed, in no acute distress. HEENT: normal. Neck: Supple, no JVD, carotid bruits, or masses. Cardiac: RRR, no murmurs, rubs, or gallops. No clubbing, cyanosis, edema.  Radials/DP/PT 2+ and equal bilaterally.  Respiratory:  Respirations regular and unlabored, clear to auscultation bilaterally. GI: Soft, nontender, nondistended, BS + x 4. MS: no deformity or atrophy. Skin: warm and dry, no rash. Neuro:  Strength and sensation are intact. Psych: Normal affect.  Accessory Clinical Findings    ECG personally reviewed by me today -    - no acute changes.   Lab Results  Component Value Date   WBC 6.7 06/28/2023   HGB 11.6 (L) 10/04/2023   HCT 36.9 06/28/2023   MCV 85.6 06/28/2023   PLT 269 06/28/2023   Lab Results  Component Value Date   CREATININE 0.93 10/25/2023   BUN 23 10/25/2023   NA 145 (H) 10/25/2023   K 4.1 10/25/2023   CL 111 (H) 10/25/2023   CO2 16 (L) 10/25/2023   Lab Results  Component Value Date   ALT 29 10/04/2023   AST 23 10/04/2023   ALKPHOS 101 08/11/2023   BILITOT 0.3 10/04/2023   Lab Results  Component Value Date   CHOL 156 01/21/2023   HDL 57 01/21/2023   LDLCALC 80 01/21/2023   TRIG 96 01/21/2023   CHOLHDL 2.7 01/21/2023    Lab Results  Component Value Date   HGBA1C 5.3 08/03/2023    Assessment & Plan    1.  ***      Damien JAYSON Braver, NP 11/15/2023, 11:15 AM

## 2023-11-16 ENCOUNTER — Encounter: Payer: Self-pay | Admitting: Nurse Practitioner

## 2023-11-16 ENCOUNTER — Ambulatory Visit

## 2023-11-16 NOTE — Telephone Encounter (Signed)
 Error

## 2023-11-17 ENCOUNTER — Telehealth: Payer: Self-pay

## 2023-11-17 DIAGNOSIS — R0602 Shortness of breath: Secondary | ICD-10-CM

## 2023-11-17 NOTE — Telephone Encounter (Signed)
 Please arrange echocardiogram per Damien Braver NP. Thank you.

## 2023-11-18 ENCOUNTER — Encounter (HOSPITAL_COMMUNITY): Payer: Self-pay

## 2023-11-21 ENCOUNTER — Telehealth (HOSPITAL_COMMUNITY): Payer: Self-pay | Admitting: *Deleted

## 2023-11-21 NOTE — Telephone Encounter (Signed)
 Reaching out to patient to offer assistance regarding upcoming cardiac imaging study; pt verbalizes understanding of appt date/time, parking situation and where to check in, pre-test NPO status and medications ordered, and verified current allergies; name and call back number provided for further questions should they arise ? ?Larey Brick RN Navigator Cardiac Imaging ?[facility] ?[phone] office ?[phone] cell ? ?Patient to take 100mg  metoprolol tartrate two hours prior to her cardiac CT scan.  ?

## 2023-11-22 ENCOUNTER — Ambulatory Visit (HOSPITAL_BASED_OUTPATIENT_CLINIC_OR_DEPARTMENT_OTHER): Payer: Self-pay | Admitting: Family

## 2023-11-22 ENCOUNTER — Ambulatory Visit (HOSPITAL_COMMUNITY)
Admission: RE | Admit: 2023-11-22 | Discharge: 2023-11-22 | Disposition: A | Discharge status: Home / Self Care | Source: Ambulatory Visit | Attending: Nurse Practitioner | Admitting: Nurse Practitioner

## 2023-11-22 ENCOUNTER — Other Ambulatory Visit: Payer: Self-pay | Admitting: Nurse Practitioner

## 2023-11-22 ENCOUNTER — Ambulatory Visit (HOSPITAL_COMMUNITY)
Admission: RE | Admit: 2023-11-22 | Discharge: 2023-11-22 | Disposition: A | Discharge status: Home / Self Care | Source: Ambulatory Visit | Attending: Cardiovascular Disease | Admitting: Cardiovascular Disease

## 2023-11-22 ENCOUNTER — Other Ambulatory Visit: Payer: Self-pay | Admitting: Cardiovascular Disease

## 2023-11-22 DIAGNOSIS — R002 Palpitations: Secondary | ICD-10-CM

## 2023-11-22 DIAGNOSIS — R0609 Other forms of dyspnea: Secondary | ICD-10-CM

## 2023-11-22 DIAGNOSIS — R931 Abnormal findings on diagnostic imaging of heart and coronary circulation: Secondary | ICD-10-CM

## 2023-11-22 DIAGNOSIS — E785 Hyperlipidemia, unspecified: Secondary | ICD-10-CM

## 2023-11-22 DIAGNOSIS — I5032 Chronic diastolic (congestive) heart failure: Secondary | ICD-10-CM

## 2023-11-22 DIAGNOSIS — G4733 Obstructive sleep apnea (adult) (pediatric): Secondary | ICD-10-CM

## 2023-11-22 DIAGNOSIS — Z86718 Personal history of other venous thrombosis and embolism: Secondary | ICD-10-CM

## 2023-11-22 DIAGNOSIS — I340 Nonrheumatic mitral (valve) insufficiency: Secondary | ICD-10-CM

## 2023-11-22 DIAGNOSIS — I10 Essential (primary) hypertension: Secondary | ICD-10-CM

## 2023-11-22 MED ORDER — NITROGLYCERIN 0.4 MG SL SUBL
0.8000 mg | SUBLINGUAL_TABLET | Freq: Once | SUBLINGUAL | Status: AC
  Administered 2023-11-22: 0.8 mg via SUBLINGUAL

## 2023-11-22 MED ORDER — IOHEXOL 350 MG/ML SOLN: 100.0000 mL | Freq: Once | INTRAVENOUS | Status: DC | PRN

## 2023-11-22 NOTE — Progress Notes (Signed)
 Patient presented for cardiac CT.  IV infiltrated during contrast administration in R upper arm/AC.    Edema and bruising noted at site.   Patient reports pain but denies numbness/tingling, loss of sensation to extremity.   +2 radial pulse.   Ice and pressure dressing applied.  Dr. Jeffrie to bedside to assess.   Education provided and precautions discussed.  Education sheet provided.

## 2023-11-23 ENCOUNTER — Other Ambulatory Visit: Payer: Self-pay

## 2023-11-23 ENCOUNTER — Telehealth (HOSPITAL_COMMUNITY): Payer: Self-pay | Admitting: *Deleted

## 2023-11-23 DIAGNOSIS — Z79899 Other long term (current) drug therapy: Secondary | ICD-10-CM

## 2023-11-23 MED ORDER — ROSUVASTATIN CALCIUM 5 MG PO TABS: 5.0000 mg | ORAL_TABLET | Freq: Every day | ORAL | 3 refills | Status: DC

## 2023-11-23 NOTE — Telephone Encounter (Signed)
 Called patient to follow up on IV extravasation from CT 8/5.  Patient reports edema still present but has decreased since yesterday.  Reports mild tingling in her pointer finger and thumb finger but able to move extremity/fingers without issues.   Denies redness or streaking.   Patient reports she will continue to elevate and has been using ice for discomfort.   Precautions discussed-advised if tingling increases or numbness/loss of sensation in extremity occurs, encouraged to get evaluated.  Patient verbalized understanding.

## 2023-11-25 ENCOUNTER — Other Ambulatory Visit (HOSPITAL_COMMUNITY): Payer: Self-pay | Admitting: *Deleted

## 2023-11-25 DIAGNOSIS — R0609 Other forms of dyspnea: Secondary | ICD-10-CM

## 2023-11-28 ENCOUNTER — Other Ambulatory Visit: Payer: Self-pay | Admitting: Urology

## 2023-11-28 ENCOUNTER — Other Ambulatory Visit: Payer: Self-pay | Admitting: Nurse Practitioner

## 2023-11-28 DIAGNOSIS — R350 Frequency of micturition: Secondary | ICD-10-CM
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
## 2023-12-01 ENCOUNTER — Ambulatory Visit

## 2023-12-01 ENCOUNTER — Other Ambulatory Visit: Payer: Self-pay | Admitting: Urology

## 2023-12-01 DIAGNOSIS — N3020 Other chronic cystitis without hematuria: Secondary | ICD-10-CM

## 2023-12-06 ENCOUNTER — Telehealth (HOSPITAL_COMMUNITY): Payer: Self-pay | Admitting: *Deleted

## 2023-12-06 NOTE — Telephone Encounter (Signed)
 Reaching out to patient to offer assistance regarding upcoming cardiac imaging study; pt verbalizes understanding of appt date/time, parking situation and where to check in, pre-test NPO status and medications ordered, and verified current allergies; name and call back number provided for further questions should they arise Jessica Seats RN Navigator Cardiac Imaging [name] [phone] office [phone] cell

## 2023-12-07 ENCOUNTER — Telehealth: Payer: Self-pay | Admitting: Urology

## 2023-12-07 ENCOUNTER — Ambulatory Visit (HOSPITAL_COMMUNITY)
Admission: RE | Admit: 2023-12-07 | Discharge: 2023-12-07 | Disposition: A | Discharge status: Home / Self Care | Source: Ambulatory Visit | Attending: Nurse Practitioner | Admitting: Nurse Practitioner

## 2023-12-07 DIAGNOSIS — I5032 Chronic diastolic (congestive) heart failure: Secondary | ICD-10-CM

## 2023-12-07 DIAGNOSIS — I2510 Atherosclerotic heart disease of native coronary artery without angina pectoris: Secondary | ICD-10-CM | POA: Diagnosis not present

## 2023-12-07 DIAGNOSIS — R0609 Other forms of dyspnea: Secondary | ICD-10-CM | POA: Diagnosis not present

## 2023-12-07 MED ORDER — NITROGLYCERIN 0.4 MG SL SUBL
SUBLINGUAL_TABLET | SUBLINGUAL | Status: AC
  Filled 2023-12-07: qty 2

## 2023-12-07 MED ORDER — IOHEXOL 350 MG/ML SOLN
100.0000 mL | Freq: Once | INTRAVENOUS | Status: AC | PRN
  Administered 2023-12-07: 100 mL via INTRAVENOUS

## 2023-12-07 MED ORDER — NITROGLYCERIN 0.4 MG SL SUBL
0.8000 mg | SUBLINGUAL_TABLET | Freq: Once | SUBLINGUAL | Status: AC
  Administered 2023-12-07: 0.8 mg via SUBLINGUAL

## 2023-12-07 NOTE — Telephone Encounter (Signed)
 Patient thinks she has a UTI again and would like a script called in. Pain and buring when urinating, feels like she has to go and only a few drops will come out. When she wipes she is seeing blood.

## 2023-12-07 NOTE — Telephone Encounter (Signed)
 Can pt come give us  a urine?

## 2023-12-07 NOTE — Telephone Encounter (Signed)
 Spoke with pt in reference to needing a urine sample prior to abx. Pt stated that she would have to find a ride to [facility] office. Pt stated that if she is not able to find a ride she will go to urgent care close to her house.

## 2023-12-08 ENCOUNTER — Ambulatory Visit: Payer: Self-pay | Admitting: Nurse Practitioner

## 2023-12-08 ENCOUNTER — Other Ambulatory Visit (HOSPITAL_COMMUNITY)

## 2023-12-12 ENCOUNTER — Encounter

## 2023-12-12 DIAGNOSIS — G4730 Sleep apnea, unspecified: Secondary | ICD-10-CM | POA: Diagnosis not present

## 2023-12-12 DIAGNOSIS — G4733 Obstructive sleep apnea (adult) (pediatric): Secondary | ICD-10-CM

## 2023-12-12 NOTE — Telephone Encounter (Signed)
 Please advise. See below question about keeping echo appointment 12/23/2023.

## 2023-12-13 NOTE — Telephone Encounter (Signed)
 Spoke with pt. Pt is aware that if she would like to cancel echo at this time, we can defer and reconsider at pts f/u if needed. Echo canceled per pts request. Pt will f/u as discussed.

## 2023-12-21 ENCOUNTER — Encounter: Payer: Self-pay | Admitting: Podiatry

## 2023-12-21 ENCOUNTER — Ambulatory Visit (INDEPENDENT_AMBULATORY_CARE_PROVIDER_SITE_OTHER): Admitting: Podiatry

## 2023-12-21 DIAGNOSIS — L02619 Cutaneous abscess of unspecified foot: Secondary | ICD-10-CM

## 2023-12-21 DIAGNOSIS — L03119 Cellulitis of unspecified part of limb: Secondary | ICD-10-CM

## 2023-12-21 MED ORDER — TRIAMCINOLONE ACETONIDE 10 MG/ML IJ SUSP
10.0000 mg | Freq: Once | INTRAMUSCULAR | Status: AC
  Administered 2023-12-21: 10 mg

## 2023-12-21 MED ORDER — AMOXICILLIN-POT CLAVULANATE 875-125 MG PO TABS: 1.0000 | ORAL_TABLET | Freq: Two times a day (BID) | ORAL | 0 refills | Status: AC

## 2023-12-21 MED ORDER — MUPIROCIN 2 % EX OINT: 1.0000 | TOPICAL_OINTMENT | Freq: Two times a day (BID) | CUTANEOUS | 0 refills | Status: AC

## 2023-12-21 NOTE — Patient Instructions (Signed)

## 2023-12-21 NOTE — Progress Notes (Signed)
 Patient presents today with a small wound on the bottom of the right foot that she did 4 days ago.  She was trying to trim an ulcer on the bottom of the foot and cut herself.  Start to get more painful over the past several days.  Has not had any fever or chills or nausea or vomiting.  Says it seems little bit swollen.  Blood sugar has been under control.   Physical exam:  General appearance: Pleasant, and in no acute distress. AOx3.  Vascular: Pedal pulses: DP 2/4 bilaterally, PT 1/4 bilaterally. Mild edema lower legs bilaterally. Capillary fill time immediate bilaterally.  Neurological: Grossly intact bilaterally  Dermatologic:   Small superficial wound about 3 mm in diameter plantar fifth metatarsal base.  Focal swelling over the area with surrounding erythema about 2 and 2 mm in radius from center of wound.  Very tender to touch.  Slight clear drainage from wound.  Musculoskeletal: Tenderness plantar lateral foot right.  Severe met adductus deformity bilaterally    Diagnosis: 1.  Cellulitis foot right plantar lateral foot  Plan: -Established office visit for evaluation and management level 3. - Discussed with her the wound on the bottom of the foot that was caused by her shaving at the callus.  I think she started to develop an infection there is surrounding redness and swelling in the area.  She has not had any fever or chills or nausea or vomiting.  Told her to be on the look out for this.  Also watch her blood sugar closely.  Will have her do local wound care and give her prescription for an oral antibiotic.  Stay off foot is much as possible and elevate foot is much as possible. -Wound care: Soak foot 15 minutes twice daily and warm Epsom salt water, apply Bactroban  oin to to wound and apply a light dressing. - Dispensed surgical shoe right to help offload wound. -Rx Augmentin  875 mg, 1 p.o. twice daily for 10 days - Rx Bactroban  ointment, apply to wound twice daily after  soaks, 2 refills    Return 1 week follow-up cellulitis and wound plantar foot right

## 2023-12-22 ENCOUNTER — Ambulatory Visit: Payer: Self-pay | Admitting: Podiatry

## 2023-12-23 ENCOUNTER — Ambulatory Visit (HOSPITAL_COMMUNITY)

## 2023-12-30 DIAGNOSIS — R069 Unspecified abnormalities of breathing: Secondary | ICD-10-CM | POA: Diagnosis not present

## 2023-12-30 DIAGNOSIS — G4733 Obstructive sleep apnea (adult) (pediatric): Secondary | ICD-10-CM | POA: Diagnosis not present

## 2024-01-04 ENCOUNTER — Other Ambulatory Visit: Payer: Medicare HMO

## 2024-01-11 ENCOUNTER — Ambulatory Visit: Admitting: Urology

## 2024-01-12 ENCOUNTER — Encounter: Payer: Self-pay | Admitting: Family Medicine

## 2024-01-24 ENCOUNTER — Ambulatory Visit: Admitting: Nurse Practitioner

## 2024-01-26 ENCOUNTER — Emergency Department (HOSPITAL_COMMUNITY)

## 2024-01-26 ENCOUNTER — Emergency Department (HOSPITAL_COMMUNITY)
Admission: EM | Admit: 2024-01-26 | Discharge: 2024-01-27 | Disposition: A | Discharge status: Home / Self Care | Attending: Emergency Medicine | Admitting: Emergency Medicine

## 2024-01-26 DIAGNOSIS — R600 Localized edema: Secondary | ICD-10-CM | POA: Diagnosis not present

## 2024-01-26 DIAGNOSIS — I509 Heart failure, unspecified: Secondary | ICD-10-CM | POA: Insufficient documentation

## 2024-01-26 DIAGNOSIS — Z7982 Long term (current) use of aspirin: Secondary | ICD-10-CM | POA: Diagnosis not present

## 2024-01-26 DIAGNOSIS — N189 Chronic kidney disease, unspecified: Secondary | ICD-10-CM | POA: Diagnosis not present

## 2024-01-26 DIAGNOSIS — E119 Type 2 diabetes mellitus without complications: Secondary | ICD-10-CM | POA: Diagnosis not present

## 2024-01-26 DIAGNOSIS — R059 Cough, unspecified: Secondary | ICD-10-CM | POA: Insufficient documentation

## 2024-01-26 DIAGNOSIS — R0602 Shortness of breath: Secondary | ICD-10-CM | POA: Diagnosis present

## 2024-01-26 DIAGNOSIS — R1030 Lower abdominal pain, unspecified: Secondary | ICD-10-CM | POA: Insufficient documentation

## 2024-01-26 LAB — CBC
HCT: 38.9 % (ref 36.0–46.0)
Hemoglobin: 12.1 g/dL (ref 12.0–15.0)
MCH: 27.1 pg (ref 26.0–34.0)
MCHC: 31.1 g/dL (ref 30.0–36.0)
MCV: 87 fL (ref 80.0–100.0)
Platelets: 211 K/uL (ref 150–400)
RBC: 4.47 MIL/uL (ref 3.87–5.11)
RDW: 17 % — ABNORMAL HIGH (ref 11.5–15.5)
WBC: 5.9 K/uL (ref 4.0–10.5)
nRBC: 0 % (ref 0.0–0.2)

## 2024-01-26 NOTE — ED Triage Notes (Signed)
 PT BIB EMS from home. Productive cough, SOB x 1 week. SOB worse with exertion.  Clear lung sounds. Used inhaler at home prior to EMS arrival.   EMS Vitals 98% RA 136/78 72 hr 99 cbg 97.1 temp  Had recent UTI but did not finish antibiotics due to the medication causing diarrhea

## 2024-01-26 NOTE — ED Notes (Signed)
 Patient transported to X-ray

## 2024-01-27 ENCOUNTER — Emergency Department (HOSPITAL_COMMUNITY)

## 2024-01-27 LAB — BASIC METABOLIC PANEL WITH GFR
Anion gap: 12 (ref 5–15)
BUN: 17 mg/dL (ref 8–23)
CO2: 17 mmol/L — ABNORMAL LOW (ref 22–32)
Calcium: 9.1 mg/dL (ref 8.9–10.3)
Chloride: 111 mmol/L (ref 98–111)
Creatinine, Ser: 0.75 mg/dL (ref 0.44–1.00)
GFR, Estimated: >60 mL/min (ref >60)
Glucose, Bld: 94 mg/dL (ref 70–99)
Potassium: 4 mmol/L (ref 3.5–5.1)
Sodium: 140 mmol/L (ref 135–145)

## 2024-01-27 LAB — URINALYSIS, W/ REFLEX TO CULTURE (INFECTION SUSPECTED)
Bacteria, UA: NONE SEEN
Bilirubin Urine: NEGATIVE
Glucose, UA: NEGATIVE mg/dL
Hgb urine dipstick: NEGATIVE
Ketones, ur: NEGATIVE mg/dL
Leukocytes,Ua: NEGATIVE
Nitrite: NEGATIVE
Protein, ur: NEGATIVE mg/dL
Specific Gravity, Urine: 1.016 (ref 1.005–1.030)
pH: 5 (ref 5.0–8.0)

## 2024-01-27 LAB — BRAIN NATRIURETIC PEPTIDE: B Natriuretic Peptide: 74.7 pg/mL (ref 0.0–100.0)

## 2024-01-27 MED ORDER — IOHEXOL 350 MG/ML SOLN
75.0000 mL | Freq: Once | INTRAVENOUS | Status: AC | PRN
  Administered 2024-01-27: 75 mL via INTRAVENOUS

## 2024-01-27 MED ORDER — PREDNISONE 20 MG PO TABS: 40.0000 mg | ORAL_TABLET | Freq: Every day | ORAL | 0 refills | Status: DC

## 2024-01-27 MED ORDER — IPRATROPIUM-ALBUTEROL 0.5-2.5 (3) MG/3ML IN SOLN
3.0000 mL | Freq: Once | RESPIRATORY_TRACT | Status: AC
  Administered 2024-01-27: 3 mL via RESPIRATORY_TRACT
  Filled 2024-01-27: qty 3

## 2024-01-27 MED ORDER — FENTANYL CITRATE (PF) 50 MCG/ML IJ SOSY
50.0000 ug | PREFILLED_SYRINGE | Freq: Once | INTRAMUSCULAR | Status: AC
  Administered 2024-01-27: 50 ug via INTRAVENOUS
  Filled 2024-01-27: qty 1

## 2024-01-27 MED ORDER — METHYLPREDNISOLONE SODIUM SUCC 125 MG IJ SOLR
125.0000 mg | Freq: Once | INTRAMUSCULAR | Status: AC
  Administered 2024-01-27: 125 mg via INTRAVENOUS
  Filled 2024-01-27: qty 2

## 2024-01-27 NOTE — ED Provider Notes (Signed)
 [facility]ERGENC[facility] DEPARTMENT AT [facility] Provider Note   CSN: [id number] Arrival date & time: 01/26/24  2241     Patient presents with: Cough and Shortness of Breath   [name] is a 68 y.o. female patient with history of CHF, diabetes, sleep apnea, chronic kidney disease who presents to the emergency department today for further evaluation of shortness of breath which has been present for last 2 days.  Shortness of breath has been constant.  She endorses productive cough as well.  She denies fever and chills.  Patient also concerned about her urine as she did have a UTI but did not finish the antibiotics because the medications were causing diarrhea and the patient does have a remote history of C. difficile.  She is also complaining of lower abdominal pain.    Cough Associated symptoms: shortness of breath   Shortness of Breath Associated symptoms: cough        Prior to Admission medications   Medication Sig Start Date End Date Taking? Authorizing Provider  predniSONE  (DELTASONE ) 20 MG tablet Take 2 tablets (40 mg total) by mouth daily. 01/27/24  Yes Theotis, Marquinn Meschke M, PA-C  acetaminophen  (TYLENOL ) 650 MG CR tablet Take 650 mg by mouth every 8 (eight) hours as needed for pain.    [provider]  albuterol  (VENTOLIN  HFA) 108 (90 Base) MCG/ACT inhaler INHALE 2 PUFFS INTO THE LUNGS EVERY 6 HOURS AS NEEDED FOR WHEEZING OR SHORTNESS OF BREATH 02/07/20   Rollene Almarie DELENA, MD  amLODipine  (NORVASC ) 10 MG tablet Take 1 tablet (10 mg total) by mouth daily. 08/02/23   Caudle, Thersia Bitters, FNP  Ascorbic Acid  (VITAMIN C ) 1000 MG tablet Take 1,000 mg by mouth daily. 11/07/10   [provider]  aspirin  EC 81 MG tablet Take 1 tablet (81 mg total) by mouth daily. Swallow whole. 08/02/23   Caudle, Thersia Bitters, FNP  azelastine  (ASTELIN ) 0.1 % nasal spray Place 1 spray into both nostrils 2 (two) times daily. Use in each nostril as directed 08/08/23    Meade Verdon RAMAN, MD  benzonatate  (TESSALON ) 100 MG capsule Take 1 capsule (100 mg total) by mouth every 8 (eight) hours as needed for cough. 08/09/23   Caudle, Thersia Bitters, FNP  budesonide  (PULMICORT ) 0.25 MG/2ML nebulizer solution Take 2 mLs (0.25 mg total) by nebulization in the morning and at bedtime. 09/06/23   Meade Verdon RAMAN, MD  buPROPion  (WELLBUTRIN  XL) 300 MG 24 hr tablet TAKE 1 TABLET(300 MG) BY MOUTH DAILY 06/20/23   Caudle, Thersia Bitters, FNP  cephALEXin  (KEFLEX ) 500 MG capsule Take 1 capsule (500 mg total) by mouth 2 (two) times daily. 11/11/23   Matilda Senior, MD  estradiol  (ESTRACE ) 0.1 MG/GM vaginal cream USE A PEA SIZED AMOUNT ON YOUR INDEX FINGER AND APPLY TO VAGINAL OPENING 2 NIGHTS A WEEK 08/24/23   Matilda Senior, MD  ferrous sulfate  325 (65 FE) MG tablet Take 1 tablet (325 mg total) by mouth daily with breakfast. 01/18/23   Odell Celinda Balo, MD  gabapentin  (NEURONTIN ) 100 MG capsule TAKE 2 CAPSULES(200 MG) BY MOUTH TWICE DAILY 10/16/23   Smith, Zachary M, DO  GEMTESA  75 MG TABS TAKE 1 TABLET BY MOUTH ONE DAILY 11/29/23   Matilda Senior, MD  ipratropium (ATROVENT ) 0.02 % nebulizer solution Take 2.5 mLs (0.5 mg total) by nebulization every 6 (six) hours as needed for wheezing or shortness of breath. 03/21/23   CaudleThersia Bitters, FNP  lipase/protease/amylase (CREON ) 36000 UNITS CPEP capsule Take  36,000 Units by mouth 4 (four) times daily as needed (Stomach Pain). 04/28/20   [provider]  loperamide  (IMODIUM ) 2 MG capsule Take 1 capsule (2 mg total) by mouth as needed for diarrhea or loose stools. 06/24/22   Meuth, Lyle LABOR, PA-C  methenamine  (HIPREX ) 1 g tablet Take 1 tablet (1 g total) by mouth 2 (two) times daily with a meal. Start after antibiotic completed (antibiotic will be called in once culture result is back) 11/07/23   Matilda Senior, MD  metoprolol  tartrate (LOPRESSOR ) 100 MG tablet Take 1 tablet 2 hours prior to cardiac ct 11/15/23   Daneen Damien BROCKS,  NP  montelukast  (SINGULAIR ) 10 MG tablet Take 1 tablet (10 mg total) by mouth at bedtime. 12/27/22   Rollene Almarie LABOR, MD  Multiple Vitamin (MULTI-VITAMIN) tablet Take 1 tablet by mouth daily.    [provider]  mupirocin  ointment (BACTROBAN ) 2 % Apply 1 Application topically 2 (two) times daily. 12/21/23   Christine Rush, DPM  nystatin  cream (MYCOSTATIN ) Apply 1 Application topically 2 (two) times daily. 08/18/23   Caudle, Thersia Bitters, FNP  pantoprazole  (PROTONIX ) 40 MG tablet Take 1 tablet (40 mg total) by mouth 2 (two) times daily before a meal. 05/09/23   Meade Verdon RAMAN, MD  PARoxetine  (PAXIL ) 20 MG tablet Take 1 tablet (20 mg total) by mouth daily. 08/02/23   Caudle, Thersia Bitters, FNP  potassium chloride  SA (KLOR-CON  M) 20 MEQ tablet Take 1 tablet (20 mEq total) by mouth 2 (two) times daily. 10/25/23   Daneen Damien BROCKS, NP  rosuvastatin  (CRESTOR ) 5 MG tablet Take 1 tablet (5 mg total) by mouth daily. 11/23/23   Vannie Reche RAMAN, NP  torsemide  (DEMADEX ) 20 MG tablet Take 20 mg by mouth daily. 10/03/23   [provider]  traMADol  (ULTRAM ) 50 MG tablet Take 1 tablet (50 mg total) by mouth every 6 (six) hours as needed for severe pain (pain score 7-10). 08/03/23   Caudle, Thersia Bitters, FNP  VITAMIN D  PO Take 1 capsule by mouth daily.    [provider]    Allergies: Macrobid  [nitrofurantoin ], Carafate  [sucralfate ], Sulfonamide derivatives, and Infed  [iron dextran ]    Review of Systems  Respiratory:  Positive for cough and shortness of breath.   All other systems reviewed and are negative.   Updated Vital Signs BP 118/70   Pulse 92   Temp 98.3 F (36.8 C) (Oral)   Resp 18   SpO2 99%   Physical Exam Vitals and nursing note reviewed.  Constitutional:      General: She is not in acute distress.    Appearance: Normal appearance.  HENT:     Head: Normocephalic and atraumatic.  Eyes:     General:        Right eye: No discharge.        Left eye: No discharge.   Cardiovascular:     Comments: Regular rate and rhythm.  S1/S2 are distinct without any evidence of murmur, rubs, or gallops.  Radial pulses are 2+ bilaterally.  Dorsalis pedis pulses are 2+ bilaterally.   Pulmonary:     Comments: Clear to auscultation bilaterally.  Normal effort.  No respiratory distress.  No evidence of wheezes, rales, or rhonchi heard throughout. Abdominal:     General: Abdomen is flat. Bowel sounds are normal. There is no distension.     Tenderness: There is no guarding or rebound.     Comments: Moderate diffuse lower abdominal tenderness with light palpation.  Musculoskeletal:  General: Normal range of motion.     Cervical back: Neck supple.     Right lower leg: 1+ Pitting Edema present.     Left lower leg: 1+ Pitting Edema present.  Skin:    General: Skin is warm and dry.     Findings: No rash.  Neurological:     General: No focal deficit present.     Mental Status: She is alert.  Psychiatric:        Mood and Affect: Mood normal.        Behavior: Behavior normal.     (all labs ordered are listed, but only abnormal results are displayed) Labs Reviewed  BASIC METABOLIC PANEL WITH GFR - Abnormal; Notable for the following components:      Result Value   CO2 17 (*)    All other components within normal limits  CBC - Abnormal; Notable for the following components:   RDW 17.0 (*)    All other components within normal limits  URINALYSIS, W/ REFLEX TO CULTURE (INFECTION SUSPECTED) - Abnormal; Notable for the following components:   Color, Urine AMBER (*)    APPearance HAZY (*)    All other components within normal limits  RESP PANEL BY RT-PCR (RSV, FLU A&B, COVID)  [id number]  BRAIN NATRIURETIC PEPTIDE    EKG: None  Radiology: CT ABDOMEN PELVIS W CONTRAST Result Date: 01/27/2024 CLINICAL DATA:  Acute abdominal pain and diarrhea EXAM: CT ABDOMEN AND PELVIS WITH CONTRAST TECHNIQUE: Multidetector CT imaging of the abdomen and pelvis was performed using  the standard protocol following bolus administration of intravenous contrast. RADIATION DOSE REDUCTION: This exam was performed according to the departmental dose-optimization program which includes automated exposure control, adjustment of the mA and/or kV according to patient size and/or use of iterative reconstruction technique. CONTRAST:  75mL OMNIPAQUE  IOHEXOL  350 MG/ML SOLN COMPARISON:  06/27/2013 FINDINGS: Lower chest: No acute abnormality. Hepatobiliary: No focal liver abnormality is seen. Status post cholecystectomy. No biliary dilatation. Pancreas: Unremarkable. No pancreatic ductal dilatation or surrounding inflammatory changes. Spleen: Normal in size without focal abnormality. Adrenals/Urinary Tract: Adrenal glands are within normal limits. Kidneys are well visualize d within normal enhancement pattern. Left renal cyst is noted stable from the prior exam. No follow-up is recommended. No renal calculi or obstructive changes are seen. The bladder is partially distended. Stomach/Bowel: No obstructive or inflammatory changes of the colon are seen. The appendix is within normal limits. Small bowel and stomach are unremarkable with the exception of postsurgical changes consistent with sleeve gastrectomy. Vascular/Lymphatic: No significant vascular findings are present. No enlarged abdominal or pelvic lymph nodes. IVC filter is noted in place. Reproductive: Uterus and bilateral adnexa are unremarkable. Other: No abdominal wall hernia or abnormality. No abdominopelvic ascites. Musculoskeletal: Postoperative changes in the lower lumbar spine are seen. IMPRESSION: No acute abnormality noted. Electronically Signed   By: Oneil Devonshire M.D.   On: 01/27/2024 02:37   DG Chest 2 View Result Date: 01/26/2024 CLINICAL DATA:  Shortness of breath EXAM: CHEST - 2 VIEW COMPARISON:  06/28/2023 FINDINGS: Cardiac shadow is enlarged but stable. Lungs are well aerated bilaterally. Scarring in the left mid lung is seen. No  sizable effusion or infiltrate is noted. No bony abnormality is seen. IMPRESSION: Fluid. Electronically Signed   By: Oneil Devonshire M.D.   On: 01/26/2024 23:12     Procedures   Medications Ordered in the ED  fentaNYL  (SUBLIMAZE ) injection 50 mcg (50 mcg Intravenous Given 01/27/24 0153)  iohexol  (OMNIPAQUE ) 350 MG/ML injection  75 mL (75 mLs Intravenous Contrast Given 01/27/24 0227)  ipratropium-albuterol  (DUONEB) 0.5-2.5 (3) MG/3ML nebulizer solution 3 mL (3 mLs Nebulization Given 01/27/24 0358)  methylPREDNISolone  sodium succinate (SOLU-MEDROL ) 125 mg/2 mL injection 125 mg (125 mg Intravenous Given 01/27/24 0358)    Clinical Course as of 01/27/24 0601  Fri Jan 27, 2024  0447 On reevaluation, patient is feeling better.  Willing to go home. [CF]    Clinical Course User Index [CF] Theotis Cameron HERO, PA-C    Medical Decision Making SANGITA ZANI is a 68 y.o. female patient who presents to the emergency department today for further evaluation of shortness of breath and abdominal pain. With regards to the shortness of breath, most likely secondary to bronchospasm. Presentation not consistent with acute cardiac etiologies to include ACS (non ischemic ekg, unremarkable trop), CHF, pericardial effusion / tamponade . Presentation not consistent with acute respiratory etiologies to include acute PE, pneumothorax , asthma, COPD exacerbation, allergic etiologies, or infectious etiologies such as PNA. Presentation also not consistent with non-cardiopulmonary causes to include toxidromes, metabolic etiologies such as acidemia or electrolyte derangements, sepsis, neurologic causes (i.e. demyelinating diseases).  With regards to her abdominal pain, abdominal exam was without obvious peritoneal signs although there was significant mild tenderness.  Will likely get a CT scan to further assess.  Patient is overall well-appearing.  Imaging was performed this was benign.  Labs were also reassuring.  Gave the  patient some steroids and DuoNeb.  Patient wanting to go home.  I think this is appropriate.  She has nebulizers at home.  Her abdominal pain has resolved with some pain medication.  No evidence of urinary tract infection today.  Again low suspicion for any other emergent causes of her shortness of breath and abdominal pain at this time.  Strict return precautions were discussed.  She is not toxic appearing.  Vital signs are normal.  She is safe for discharge at this time.   Amount and/or Complexity of Data Reviewed Labs: ordered. Radiology: ordered.  Risk Prescription drug management.    Final diagnoses:  Shortness of breath  Lower abdominal pain    ED Discharge Orders          Ordered    predniSONE  (DELTASONE ) 20 MG tablet  Daily        01/27/24 0448               Theotis Cameron HERO, PA-C 01/27/24 0601    Raford Lenis, MD 01/27/24 [phone]

## 2024-01-27 NOTE — Discharge Instructions (Addendum)
 As we discussed, your urine does not appear to be infected.  CT scan was normal.  I would continue using your albuterol  nebulizer at home.  I will prescribe you some steroids to go home with.  Please start taking them tomorrow.  You can follow-up with your primary care doctor.  You may return to the emergency department for any worsening symptoms.

## 2024-01-31 ENCOUNTER — Ambulatory Visit: Admitting: Internal Medicine

## 2024-02-07 ENCOUNTER — Other Ambulatory Visit (HOSPITAL_BASED_OUTPATIENT_CLINIC_OR_DEPARTMENT_OTHER): Payer: Self-pay | Admitting: Family Medicine

## 2024-02-07 DIAGNOSIS — L304 Erythema intertrigo: Secondary | ICD-10-CM

## 2024-02-09 ENCOUNTER — Other Ambulatory Visit: Payer: Self-pay

## 2024-02-13 MED ORDER — TORSEMIDE 20 MG PO TABS: 20.0000 mg | ORAL_TABLET | Freq: Every day | ORAL | 2 refills | Status: AC

## 2024-02-14 ENCOUNTER — Ambulatory Visit: Admitting: Nurse Practitioner

## 2024-02-14 ENCOUNTER — Other Ambulatory Visit (HOSPITAL_BASED_OUTPATIENT_CLINIC_OR_DEPARTMENT_OTHER): Payer: Self-pay | Admitting: Family Medicine

## 2024-02-20 ENCOUNTER — Encounter: Payer: Self-pay | Admitting: Radiology

## 2024-02-23 ENCOUNTER — Ambulatory Visit: Attending: Nurse Practitioner | Admitting: Nurse Practitioner

## 2024-02-23 ENCOUNTER — Encounter: Payer: Self-pay | Admitting: Nurse Practitioner

## 2024-02-23 VITALS — BP 129/73 | HR 68 | Ht 65.0 in | Wt 242.0 lb

## 2024-02-23 DIAGNOSIS — R002 Palpitations: Secondary | ICD-10-CM

## 2024-02-23 DIAGNOSIS — R0602 Shortness of breath: Secondary | ICD-10-CM

## 2024-02-23 DIAGNOSIS — E785 Hyperlipidemia, unspecified: Secondary | ICD-10-CM

## 2024-02-23 DIAGNOSIS — I2510 Atherosclerotic heart disease of native coronary artery without angina pectoris: Secondary | ICD-10-CM | POA: Diagnosis not present

## 2024-02-23 DIAGNOSIS — I340 Nonrheumatic mitral (valve) insufficiency: Secondary | ICD-10-CM

## 2024-02-23 DIAGNOSIS — I77810 Thoracic aortic ectasia: Secondary | ICD-10-CM

## 2024-02-23 DIAGNOSIS — G4733 Obstructive sleep apnea (adult) (pediatric): Secondary | ICD-10-CM

## 2024-02-23 DIAGNOSIS — I5032 Chronic diastolic (congestive) heart failure: Secondary | ICD-10-CM

## 2024-02-23 DIAGNOSIS — Z86718 Personal history of other venous thrombosis and embolism: Secondary | ICD-10-CM

## 2024-02-23 DIAGNOSIS — I10 Essential (primary) hypertension: Secondary | ICD-10-CM

## 2024-02-23 NOTE — Patient Instructions (Signed)
 Medication Instructions:  Your physician recommends that you continue on your current medications as directed. Please refer to the Current Medication list given to you today.  *If you need a refill on your cardiac medications before your next appointment, please call your pharmacy*  Lab Work: Fasting lipid panel & LFTs  Testing/Procedures: Your physician has requested that you have an echocardiogram. Echocardiography is a painless test that uses sound waves to create images of your heart. It provides your doctor with information about the size and shape of your heart and how well your heart's chambers and valves are working. This procedure takes approximately one hour. There are no restrictions for this procedure. Please do NOT wear cologne, perfume, aftershave, or lotions (deodorant is allowed). Please arrive 15 minutes prior to your appointment time.  Please note: We ask at that you not bring children with you during ultrasound (echo/ vascular) testing. Due to room size and safety concerns, children are not allowed in the ultrasound rooms during exams. Our front office staff cannot provide observation of children in our lobby area while testing is being conducted. An adult accompanying a patient to their appointment will only be allowed in the ultrasound room at the discretion of the ultrasound technician under special circumstances. We apologize for any inconvenience.   Follow-Up: At [facility], you and your health needs are our priority.  As part of our continuing mission to provide you with exceptional heart care, our providers are all part of one team.  This team includes your primary Cardiologist (physician) and Advanced Practice Providers or APPs (Physician Assistants and Nurse Practitioners) who all work together to provide you with the care you need, when you need it.  Your next appointment:   6 month(s)  Provider:   Damien Braver, NP          We recommend signing up for  the patient portal called MyChart.  Sign up information is provided on this After Visit Summary.  MyChart is used to connect with patients for Virtual Visits (Telemedicine).  Patients are able to view lab/test results, encounter notes, upcoming appointments, etc.  Non-urgent messages can be sent to your provider as well.   To learn more about what you can do with MyChart, go to [email or web address].   Other Instructions

## 2024-02-23 NOTE — Progress Notes (Signed)
 Office Visit    Patient Name: [name] Date of Encounter: 02/23/2024  Primary Care Provider:  Siganporia, Arnaz, FNP Primary Cardiologist:  Alm Clay, MD  Chief Complaint    68 year old female with a history of minimal minimal nonobstructive CAD, PSVT, chronic diastolic heart failure, mild mitral valve regurgitation, mild dilation of the ascending aorta, hypertension, bilateral DVTs s/p IVC filter in 2009, OSA, and obesity who presents for follow-up related to CAD and heart failure.   Past Medical History    Past Medical History:  Diagnosis Date   Allergy 2010   Anemia    takes Ferrous Sulfate  daily   Anxiety    takes Citalopram  daily   Arthritis    Asthma    2004-prior to gastric bypass and no problems since   CAP (community acquired pneumonia) 07/23/2021   CHF (congestive heart failure) (HCC)    takes Lasix  daily as needed   Chronic back pain    spondylolisthesis/stenosis/radiculopathy   CKD (chronic kidney disease) stage 2, GFR 60-89 ml/min    Complication of anesthesia yrs ago   slow to wake up   Depression    Diabetes (HCC)    DVT (deep venous thrombosis) (HCC) 01/17/2013   past hx. -tx.5-6 yrs ago bilateral legs, occ. sporadic swelling, has IVC filter implanted   Dysrhythmia    heart tends to flutter   Fibromyalgia    Fracture    right foot and is in a cam boot   Gallstones    GERD (gastroesophageal reflux disease)    hx of-no meds now   HCAP (healthcare-associated pneumonia) 02/19/2023   Heart murmur    History of bronchitis 2012 or 2013   History of colon polyps    Hypertension    takes Metoprolol  daily   Insomnia    takes Melatonin daily   Microalbuminuria    Pelvic floor dysfunction    Peripheral neuropathy    Pneumonia 90's   hx of   Recurrent Clostridioides difficile infection 02/28/2020   S/P gastric bypass 2003   Sleep apnea    no cpap used in many yrs after weight lost-no machine now   Urinary frequency    Urinary  urgency    Vitamin D  deficiency    Past Surgical History:  Procedure Laterality Date   BALLOON DILATION N/A 01/31/2013   Procedure: BALLOON DILATION;  Surgeon: Elsie Cree, MD;  Location: [facility];  Service: Endoscopy;  Laterality: N/A;   CARDIAC EVENT MONITOR  03/2019   Predominantly sinus rhythm.  Rates range from 48-124 bpm.  Average 74 bpm.  Frequent short bursts (3-15 beats) PAT/PSVT--not indicated as being symptomatic on diary.  Otherwise rare PACs and PVCs.   CARDIAC EVENT MONITOR  08/2021   14-Day Zio Patch: Predominantly sinus rhythm with rate range 45 to 135 bpm.  Average 73 bpm.  Occasional (1.3%) PACs with rare PVCs and rare PAC couplets.  24 episodes of atrial runs: Fastest was 11 beats at a rate of 184 bpm, longest was 13 beats with a rate of 92 bpm.  No sustained tachyarrhythmias or bradycardia arrhythmias.  No atrial fibrillation or flutter.   CARDIOPULMONARY EXERCISE STRESS TEST (CPX)  10/27/2021   Good effort despite dyspnea and wheezing.  Modified Naughton protocol on treadmill.  Normal heart rate response.  No sustained arrhythmias.  Preexercise spirometry normal.  Low normal peak VO2. Low normal functional capacity with NO Clear Cardiopulmonary Limitation.  Overall limitation primarily due to obesity & deconditioning, along with a component  of Diastolic Dysfunction   CHOLECYSTECTOMY  2007   COLONOSCOPY     CT CTA CORONARY W/CA SCORE W/CM &/OR WO/CM  09/21/2019   Coronary Calcium  Score 0.  Normal RCA dominant coronary anatomy.  CAD RADS 1-minimal nonobstructive CAD (0-24%).  Consider nonatherosclerotic cause for chest pain.  Consider preventative therapy and risk factor modification.   DILATION AND CURETTAGE OF UTERUS  yrs ago   ESOPHAGOGASTRODUODENOSCOPY (EGD) WITH PROPOFOL   03/01/2012   Procedure: ESOPHAGOGASTRODUODENOSCOPY (EGD) WITH PROPOFOL ;  Surgeon: Elsie Cree, MD;  Location: [facility];  Service: Endoscopy;  Laterality: N/A;   ESOPHAGOGASTRODUODENOSCOPY  (EGD) WITH PROPOFOL  N/A 01/31/2013   Procedure: ESOPHAGOGASTRODUODENOSCOPY (EGD) WITH PROPOFOL ;  Surgeon: Elsie Cree, MD;  Location: [facility];  Service: Endoscopy;  Laterality: N/A;   ESOPHAGOGASTRODUODENOSCOPY (EGD) WITH PROPOFOL  N/A 09/02/2015   Procedure: ESOPHAGOGASTRODUODENOSCOPY (EGD) WITH PROPOFOL ;  Surgeon: Lupita FORBES Commander, MD;  Location: [facility];  Service: Endoscopy;  Laterality: N/A;   FRACTURE SURGERY     GASTRIC BY-PASS  2004   HERNIA REPAIR  2005   INSERTION OF VENA CAVA FILTER  01/17/2013   inserted 2004- abdomen   LIGAMENT REPAIR Right 1987   Rt. knee scope   MAXIMUM ACCESS (MAS)POSTERIOR LUMBAR INTERBODY FUSION (PLIF) 2 LEVEL N/A 06/07/2014   Procedure: L4-5 L5-S1 FOR MAXIMUM ACCESS (MAS) POSTERIOR LUMBAR INTERBODY FUSION ;  Surgeon: Fairy Levels, MD;  Location: [facility];  Service: Neurosurgery;  Laterality: N/A;  L4-5 L5-S1 FOR MAXIMUM ACCESS (MAS) POSTERIOR LUMBAR INTERBODY FUSION    SPINE SURGERY  2017   TONSILLECTOMY     as child   TRANSTHORACIC ECHOCARDIOGRAM  12/2020   EF 55 to 60%.  No RWMA.  Mild LVH.  GR 1 DD-moderate LA dilation..  Mild MR..  Normal RV size/function.  Normal PAP.  Normal RAP. => Essentially stable from December 2020    Allergies  Allergies  Allergen Reactions   Macrobid  [Nitrofurantoin ] Other (See Comments)    C.Diff Infection   Carafate  [Sucralfate ] Hives, Itching and Other (See Comments)    Angioedema      Sulfonamide Derivatives Rash    Syncope   Infed  [Iron Dextran ]      Labs/Other Studies Reviewed    The following studies were reviewed today:  Cardiac Studies & Procedures   ______________________________________________________________________________________________     ECHOCARDIOGRAM  ECHOCARDIOGRAM COMPLETE 01/21/2023  Narrative ECHOCARDIOGRAM REPORT    Patient Name:   [name] Date of Exam: 01/21/2023 Medical Rec #:  [id number]           Height:       65.0 in Accession #:     [id number]          Weight:       247.0 lb Date of Birth:  [birth year]           BSA:          2.164 m Patient Age:    67 years            BP:           144/110 mmHg Patient Gender: F                   HR:           70 bpm. Exam Location:  Inpatient  Procedure: 2D Echo, Cardiac Doppler and Color Doppler  Indications:    TIA G45.9  History:        Patient has prior history of Echocardiogram examinations, most recent  01/14/2021. COVID; Risk Factors:Hypertension, Diabetes and Former Smoker.  Sonographer:    Tillman Nora RVT RCS Referring Phys: [phone] MCNEILL P KIRKPATRICK   Sonographer Comments: Technically challenging study due to limited acoustic windows, Technically difficult study due to poor echo windows, suboptimal parasternal window and suboptimal apical window. Image acquisition challenging due to patient body habitus. Unable to complete ECHO; patient unable to tolerate probe pressure. IMPRESSIONS   1. Left ventricular ejection fraction, by estimation, is 60 to 65%. The left ventricle has normal function. The left ventricle has no regional wall motion abnormalities. Left ventricular diastolic parameters are consistent with Grade I diastolic dysfunction (impaired relaxation). 2. Right ventricular systolic function is normal. The right ventricular size is normal. 3. The mitral valve is normal in structure. Trivial mitral valve regurgitation. No evidence of mitral stenosis. 4. Tricuspid valve regurgitation is mild to moderate. 5. The aortic valve has an indeterminant number of cusps. Aortic valve regurgitation is not visualized. No aortic stenosis is present. 6. There is borderline dilatation of the aortic root, measuring 37 mm. 7. The inferior vena cava is normal in size with greater than 50% respiratory variability, suggesting right atrial pressure of 3 mmHg.  FINDINGS Left Ventricle: Left ventricular ejection fraction, by estimation, is 60 to 65%. The left ventricle has normal  function. The left ventricle has no regional wall motion abnormalities. The left ventricular internal cavity size was normal in size. There is no left ventricular hypertrophy. Left ventricular diastolic parameters are consistent with Grade I diastolic dysfunction (impaired relaxation).  Right Ventricle: The right ventricular size is normal. No increase in right ventricular wall thickness. Right ventricular systolic function is normal.  Left Atrium: Left atrial size was normal in size.  Right Atrium: Right atrial size was normal in size.  Pericardium: There is no evidence of pericardial effusion. Presence of epicardial fat layer.  Mitral Valve: The mitral valve is normal in structure. Trivial mitral valve regurgitation. No evidence of mitral valve stenosis.  Tricuspid Valve: The tricuspid valve is normal in structure. Tricuspid valve regurgitation is mild to moderate. No evidence of tricuspid stenosis.  Aortic Valve: The aortic valve has an indeterminant number of cusps. Aortic valve regurgitation is not visualized. No aortic stenosis is present. Aortic valve mean gradient measures 3.0 mmHg. Aortic valve peak gradient measures 6.2 mmHg. Aortic valve area, by VTI measures 2.64 cm.  Pulmonic Valve: The pulmonic valve was not well visualized. Pulmonic valve regurgitation is not visualized. No evidence of pulmonic stenosis.  Aorta: There is borderline dilatation of the aortic root, measuring 37 mm.  Venous: The inferior vena cava is normal in size with greater than 50% respiratory variability, suggesting right atrial pressure of 3 mmHg.  IAS/Shunts: No atrial level shunt detected by color flow Doppler.   LEFT VENTRICLE PLAX 2D LVIDd:         5.20 cm   Diastology LVIDs:         3.50 cm   LV e' medial:    5.48 cm/s LV PW:         0.90 cm   LV E/e' medial:  9.5 LV IVS:        1.00 cm   LV e' lateral:   6.53 cm/s LVOT diam:     2.10 cm   LV E/e' lateral: 8.0 LV SV:         80 LV SV Index:    37 LVOT Area:     3.46 cm   RIGHT VENTRICLE RV S prime:  9.30 cm/s TAPSE (M-mode): 1.7 cm  LEFT ATRIUM             Index        RIGHT ATRIUM           Index LA diam:        4.40 cm 2.03 cm/m   RA Area:     16.60 cm LA Vol (A2C):   72.0 ml 33.28 ml/m  RA Volume:   44.90 ml  20.75 ml/m LA Vol (A4C):   58.0 ml 26.80 ml/m LA Biplane Vol: 65.0 ml 30.04 ml/m AORTIC VALVE                    PULMONIC VALVE AV Area (Vmax):    2.61 cm     PV Vmax:          0.86 m/s AV Area (Vmean):   2.59 cm     PV Peak grad:     2.9 mmHg AV Area (VTI):     2.64 cm     PR End Diast Vel: 6.45 msec AV Vmax:           124.00 cm/s AV Vmean:          82.800 cm/s AV VTI:            0.302 m AV Peak Grad:      6.2 mmHg AV Mean Grad:      3.0 mmHg LVOT Vmax:         93.60 cm/s LVOT Vmean:        62.000 cm/s LVOT VTI:          0.230 m LVOT/AV VTI ratio: 0.76  AORTA Ao Root diam: 3.70 cm Ao Asc diam:  3.50 cm  MITRAL VALVE MV Area (PHT): 2.80 cm    SHUNTS MV Decel Time: 271 msec    Systemic VTI:  0.23 m MV E velocity: 52.10 cm/s  Systemic Diam: 2.10 cm MV A velocity: 70.60 cm/s MV E/A ratio:  0.74  Kardie Tobb DO Electronically signed by Dub Huntsman DO Signature Date/Time: 01/21/2023/3:05:25 PM    Final    MONITORS  LONG TERM MONITOR (3-14 DAYS) 09/23/2021  Narrative   Zio Patch Wear Time:  8 days and 12 hours (2023-05-18T07:53:20-0400 to 2023-05-26T20:45:22-0400)   Predominant Underlying Rhythm Was Sinus Rhythm: Heart rate range 45 to 135 bpm.  Average 73 bpm.   24 episodes of Supraventricular Tachycardia (SVT)/Atrial Tachycardia (atrial runs): Fastest was 11 beats at 184 bpm, longest was 13 beats at an average rate 92 bpm. => Episodes were noted on log.   Occasional PACs (1.3%) with rare couplets and triplets.  Rare PVCs.   Relatively benign findings.  Reviewed in clinic.  Alm Clay, MD   CT SCANS  CT CORONARY MORPH W/CTA COR W/SCORE 12/07/2023  Addendum 12/10/2023  2:15  AM ADDENDUM REPORT: 12/10/2023 02:13  EXAM: OVER-READ INTERPRETATION  CT CHEST  The following report is an over-read performed by radiologist Dr. Suzen Dials of [facility] on 12/10/2023. This over-read does not include interpretation of cardiac or coronary anatomy or pathology. The coronary calcium  score/coronary CTA interpretation by the cardiologist is attached.  COMPARISON:  None.  FINDINGS: Cardiovascular: There are no significant extracardiac vascular findings.  Mediastinum/Nodes: There are no enlarged lymph nodes within the visualized mediastinum.  Lungs/Pleura: There is no pleural effusion. There is moderate severity lingular linear scarring and/or atelectasis. Mild right middle lobe and mild posterior right basilar linear scarring and/or atelectasis  is also seen.  Upper abdomen: No significant findings in the visualized upper abdomen.  Musculoskeletal/Chest wall: No chest wall mass or suspicious osseous findings within the visualized chest.  IMPRESSION: No significant extracardiac findings within the visualized chest.   Electronically Signed By: Suzen Dials M.D. On: 12/10/2023 02:13  Narrative CLINICAL DATA:  68 yo female with dyspnea on exertion (DOE)  EXAM: Cardiac/Coronary CTA  TECHNIQUE: A non-contrast, gated CT scan was obtained with axial slices of 2.5 mm through the heart for calcium  scoring. Calcium  scoring was performed using the Agatston method. A 120 kV prospective, gated, contrast cardiac CT scan was obtained. Gantry rotation speed was 230 msec and collimation was 0.63 mm. Two sublingual nitroglycerin  tablets (0.8 mg) were given. The 3D data set was reconstructed with motion correction for the best systolic or diastolic phase. Images were analyzed on a dedicated workstation using MPR, MIP, and VRT modes. The patient received 100mL OMNIPAQUE  IOHEXOL  350 MG/ML SOLN of contrast.  FINDINGS: Image quality:  Excellent.  Noise artifact is: Limited.  Coronary arteries: Normal coronary origins.  Right dominance.  Right Coronary Artery: Dominant. No significant disease. Normal R-PLB and R-PDA branches.  Left Main Coronary Artery: Short vessel, immediately bifurcates into the LAD and LCx arteries.  Left Anterior Descending Coronary Artery: Large anterior artery that wraps around the apex. Minimal mixed proximal calcification without stenosis. Large tortuous D1 branch that bifurcates, no disease. Smaller D2 branch without disease.  Left Circumflex Artery: AV groove vessel, no disease. Large OM1 branch which is tortuous, no disease.  Aorta: Normal size, 34 mm at the mid ascending aorta (level of the PA bifurcation) measured double oblique. No calcifications. No dissection.  Aortic Valve: Trileaflet. No calcifications.  Other findings:  Normal pulmonary vein drainage into the left atrium.  Normal left atrial appendage without a thrombus.  Moderately dilated main pulmonary artery to 36 mm, suggestive of probable pulmonary hypertension.  Extra-cardiac findings: See attached radiology report for non-cardiac structures.  IMPRESSION: 1. Minimal mixed non-obstructive CAD, CADRADS = 1.  2. Normal coronary origin with right dominance.  3. Coronary artery calcium  score is 269, which places the patient in the 88th percentile for age/race and sex-matched controls (MESA).  4. Total plaque volume 402 mm3, of which 31 mm3 (8%) is calcified plaque and 356mm3 (92%) is non-calcified plaque. TPV is severe.  5. Moderately dilated main pulmonary artery to 36 mm, suggestive of probable pulmonary hypertension.  6. Aggressive secondary risk factor modification is recommended, with lipid lowering to target LDL < 55 and adjunctive risk-lowering therapy if indicated.  RECOMMENDATIONS: 1. CAD-RADS 0: No evidence of CAD (0%). Consider non-atherosclerotic causes of chest pain.  2. CAD-RADS 1:  Minimal non-obstructive CAD (0-24%). Consider non-atherosclerotic causes of chest pain. Consider preventive therapy and risk factor modification.  3. CAD-RADS 2: Mild non-obstructive CAD (25-49%). Consider non-atherosclerotic causes of chest pain. Consider preventive therapy and risk factor modification.  4. CAD-RADS 3: Moderate stenosis. Consider symptom-guided anti-ischemic pharmacotherapy as well as risk factor modification per guideline directed care. Additional analysis with CT FFR will be submitted.  5. CAD-RADS 4: Severe stenosis. (70-99% or > 50% left main). Cardiac catheterization or CT FFR is recommended. Consider symptom-guided anti-ischemic pharmacotherapy as well as risk factor modification per guideline directed care. Invasive coronary angiography recommended with revascularization per published guideline statements.  6. CAD-RADS 5: Total coronary occlusion (100%). Consider cardiac catheterization or viability assessment. Consider symptom-guided anti-ischemic pharmacotherapy as well as risk factor modification per guideline directed care.  7.  CAD-RADS N: Non-diagnostic study. Obstructive CAD can't be excluded. Alternative evaluation is recommended.  Electronically Signed: By: Vinie JAYSON Maxcy M.D. On: 12/08/2023 11:10   CT SCANS  CT CORONARY MORPH W/CTA COR W/SCORE 09/21/2019  Addendum 09/21/2019  6:16 PM ADDENDUM REPORT: 09/21/2019 18:14  CLINICAL DATA:  68 year old female with h/o hypertension, hyperlipidemia and dyspnea on exertion.  EXAM: Cardiac/Coronary  CTA  TECHNIQUE: The patient was scanned on a [employer].  FINDINGS: A 100 kV prospective scan was triggered in the descending thoracic aorta at 111 HU's. Axial non-contrast 3 mm slices were carried out through the heart. The data set was analyzed on a dedicated work station and scored using the Agatson method. Gantry rotation speed was 250 msecs and collimation was .6 mm. No beta  blockade and 0.8 mg of sl NTG was given. The 3D data set was reconstructed in 5% intervals of the 67-82 % of the R-R cycle. Diastolic phases were analyzed on a dedicated work station using MPR, MIP and VRT modes. The patient received 80 cc of contrast.  Aorta:  Normal size.  No calcifications.  No dissection.  Aortic Valve:  Trileaflet.  No calcifications.  Coronary Arteries:  Normal coronary origin.  Left dominance.  RCA is a small non-dominant artery.  Left main is a large artery that gives rise to LAD and LCX arteries. Left main has no plaque.  LAD is a large vessel that gives rise to one large diagonal artery and wraps around the apex. Proximal LAD has mild diffuse calcified plaque with stenosis 0-25%. Mid and distal LAD has only luminal irregularities.  D1 is a large artery with minimal plaque.  LCX is a large lumen dominant artery that gives rise to two large OM branches, PDA and PLA. There is minimal plaque.  OM1, OM2 are large branches with minimal irregularities.  PDA, PLA have minimal plaque.  Other findings:  Normal pulmonary vein drainage into the left atrium.  Normal left atrial appendage without a thrombus.  Normal size of the pulmonary artery.  IMPRESSION: 1. Coronary calcium  score of 117. This was 34 percentile for age and sex matched control.  2. Normal coronary origin with left dominance.  3.  EXAM: Cardiac/Coronary  CTA  TECHNIQUE: The patient was scanned on a [employer].  FINDINGS: A 100 kV prospective scan was triggered in the descending thoracic aorta at 111 HU's. Axial non-contrast 3 mm slices were carried out through the heart. The data set was analyzed on a dedicated work station and scored using the Agatson method. Gantry rotation speed was 250 msecs and collimation was .6 mm. No beta blockade and 0.8 mg of sl NTG was given. The 3D data set was reconstructed in 5% intervals of the 67-82 % of the R-R cycle. Diastolic  phases were analyzed on a dedicated work station using MPR, MIP and VRT modes. The patient received 80 cc of contrast.  Aorta:  Normal size.  No calcifications.  No dissection.  Aortic Valve:  Trileaflet.  No calcifications.  Coronary Arteries:  Normal coronary origin.  Right dominance.  RCA is a large dominant artery that gives rise to PDA and PLA. There is no plaque.  Left main is a large artery that gives rise to LAD and LCX arteries.  LAD is a large vessel that has no plaque.  LCX is a non-dominant artery that gives rise to one large OM1 branch. There is no plaque.  Other findings:  Normal pulmonary vein drainage into the left  atrium.  Normal left atrial appendage without a thrombus.  Normal size of the pulmonary artery.  IMPRESSION: 1. Coronary calcium  score of 0. This was 0 percentile for age and sex matched control.  2. Normal coronary origin with right dominance.  3. CAD-RADS 1. Minimal non-obstructive CAD (0-24%). Consider non-atherosclerotic causes of chest pain. Consider preventive therapy and risk factor modification.   Electronically Signed By: Leim Moose On: 09/21/2019 18:14  Narrative EXAM: OVER-READ INTERPRETATION  CT CHEST  The following report is an over-read performed by radiologist Dr. Franky Crease of [facility] on 09/21/2019. This over-read does not include interpretation of cardiac or coronary anatomy or pathology. The coronary CTA interpretation by the cardiologist is attached.  COMPARISON:  12/10/2016  FINDINGS: Vascular: Heart is normal size.  Aorta normal caliber.  Mediastinum/Nodes: No adenopathy in the lower mediastinum or hila.  Lungs/Pleura: Lingular scarring. No confluent opacities or effusions.  Upper Abdomen: Imaging into the upper abdomen shows no acute findings.  Musculoskeletal: Chest wall soft tissues are unremarkable. No acute bony abnormality.  IMPRESSION: No acute or significant extracardiac  abnormality.  Electronically Signed: By: Franky Crease M.D. On: 09/21/2019 16:03     ______________________________________________________________________________________________     Recent Labs: 10/04/2023: ALT 29 01/26/2024: BUN 17; Creatinine, Ser 0.75; Hemoglobin 12.1; Platelets 211; Potassium 4.0; Sodium 140 01/27/2024: B Natriuretic Peptide 74.7  Recent Lipid Panel    Component Value Date/Time   CHOL 156 01/21/2023 0650   TRIG 96 01/21/2023 0650   TRIG 70 02/18/2006 0933   HDL 57 01/21/2023 0650   CHOLHDL 2.7 01/21/2023 0650   VLDL 19 01/21/2023 0650   LDLCALC 80 01/21/2023 0650    History of Present Illness    68 year old female with the above past medical history including minimal nonobstructive CAD, PSVT, chronic diastolic heart failure, mild mitral valve regurgitation, mild dilation of the ascending aorta, hypertension, bilateral DVTs s/p IVC filter in 2009, OSA, and obesity.   Coronary CT angiogram in 2021 in the setting of chest tightness, shortness of breath, revealed coronary calcium  score 117 (83rd percentile), minimal nonobstructive CAD. Echocardiogram in 2022 showed EF 55 to 60%, grade 1 diastolic function, mildly dilated left atrium, mild mitral valve regurgitation, mild dilation of ascending aorta measuring 39 mm.  14-day Zio patch in 2023 in the setting of palpitations showed 24 episodes of SVT, longest episode lasting 13 beats, no evidence of atrial fibrillation.  CPX in 2023 showed low normal functional capacity, no clear cardiopulmonary limitation.  Echocardiogram in 01/2023 showed EF 60 to 65%, no RWMA, G1 DD, normal RV systolic function.  She was evaluated in the ED on 06/28/2023 in the setting of shortness of breath.  She also noted symptoms of cough, general malaise, diarrhea and abdominal pain. CT of the abdomen pelvis was negative for acute process..  Chest x-ray revealed mild pulmonary edema.  BNP was minimally elevated at 108.  She had been recently started  on Cipro  for UTI.  She was advised to follow-up with her PCP to rule out possible C. difficile infection.  She was treated with a course of azithromycin , prednisone , Tessalon  Perles her symptoms were felt to be due to mild bronchitis. She was discharged home in stable condition.  She was last in office on 07/12/2023 and was stable overall from a cardiac standpoint.  She did note some mild dyspnea on exertion, abdominal fullness.  Torsemide  was increased.  ABIs in the setting of bilateral leg pain revealed no evidence for peripheral arterial disease.  She was last seen in the office on 11/15/2023 and continued to note dyspnea on exertion, generalized fatigue, intermittent chest tightness at rest. Echocardiogram was ordered but not completed.  Coronary CT angiogram in 11/2023 revealed coronary calcium  score of 269 (88 percentile), severe TPV, mixed nonobstructive CAD.  She presents today for follow-up. Since her last visit she has been stable from a cardiac standpoint.  She continues to note dyspnea on exertion, though she reports some mild improvement with weight loss.  She denies chest pain.  Overall, she reports feeling well.  Home Medications    Current Outpatient Medications  Medication Sig Dispense Refill   acetaminophen  (TYLENOL ) 650 MG CR tablet Take 650 mg by mouth every 8 (eight) hours as needed for pain.     albuterol  (VENTOLIN  HFA) 108 (90 Base) MCG/ACT inhaler INHALE 2 PUFFS INTO THE LUNGS EVERY 6 HOURS AS NEEDED FOR WHEEZING OR SHORTNESS OF BREATH 3 each 2   amLODipine  (NORVASC ) 10 MG tablet Take 1 tablet (10 mg total) by mouth daily. 90 tablet 3   Ascorbic Acid  (VITAMIN C ) 1000 MG tablet Take 1,000 mg by mouth daily.     aspirin  EC 81 MG tablet Take 1 tablet (81 mg total) by mouth daily. Swallow whole. 30 tablet 12   azelastine  (ASTELIN ) 0.1 % nasal spray Place 1 spray into both nostrils 2 (two) times daily. Use in each nostril as directed 30 mL 12   benzonatate  (TESSALON ) 100 MG capsule Take  1 capsule (100 mg total) by mouth every 8 (eight) hours as needed for cough. 45 capsule 3   budesonide  (PULMICORT ) 0.25 MG/2ML nebulizer solution Take 2 mLs (0.25 mg total) by nebulization in the morning and at bedtime. 120 mL 11   buPROPion  (WELLBUTRIN  XL) 300 MG 24 hr tablet TAKE 1 TABLET(300 MG) BY MOUTH DAILY 90 tablet 3   estradiol  (ESTRACE ) 0.1 MG/GM vaginal cream USE A PEA SIZED AMOUNT ON YOUR INDEX FINGER AND APPLY TO VAGINAL OPENING 2 NIGHTS A WEEK 42.5 g 3   ferrous sulfate  325 (65 FE) MG tablet Take 1 tablet (325 mg total) by mouth daily with breakfast. 30 tablet 3   gabapentin  (NEURONTIN ) 100 MG capsule TAKE 2 CAPSULES(200 MG) BY MOUTH TWICE DAILY 180 capsule 0   GEMTESA  75 MG TABS TAKE 1 TABLET BY MOUTH ONE DAILY 30 tablet 11   ipratropium (ATROVENT ) 0.02 % nebulizer solution Take 2.5 mLs (0.5 mg total) by nebulization every 6 (six) hours as needed for wheezing or shortness of breath. 75 mL 12   lipase/protease/amylase (CREON ) 36000 UNITS CPEP capsule Take 36,000 Units by mouth 4 (four) times daily as needed (Stomach Pain).     loperamide  (IMODIUM ) 2 MG capsule Take 1 capsule (2 mg total) by mouth as needed for diarrhea or loose stools. 30 capsule 0   methenamine  (HIPREX ) 1 g tablet Take 1 tablet (1 g total) by mouth 2 (two) times daily with a meal. Start after antibiotic completed (antibiotic will be called in once culture result is back) 60 tablet 11   montelukast  (SINGULAIR ) 10 MG tablet Take 1 tablet (10 mg total) by mouth at bedtime. 90 tablet 3   Multiple Vitamin (MULTI-VITAMIN) tablet Take 1 tablet by mouth daily.     mupirocin  ointment (BACTROBAN ) 2 % Apply 1 Application topically 2 (two) times daily. 22 g 0   nystatin  cream (MYCOSTATIN ) Apply 1 Application topically 2 (two) times daily. 60 g 3   pantoprazole  (PROTONIX ) 40 MG tablet Take 1 tablet (40  mg total) by mouth 2 (two) times daily before a meal. 60 tablet 3   PARoxetine  (PAXIL ) 20 MG tablet Take 1 tablet (20 mg total) by  mouth daily. 90 tablet 3   potassium chloride  SA (KLOR-CON  M) 20 MEQ tablet Take 1 tablet (20 mEq total) by mouth 2 (two) times daily. 180 tablet 3   rosuvastatin  (CRESTOR ) 5 MG tablet Take 1 tablet (5 mg total) by mouth daily. 90 tablet 3   torsemide  (DEMADEX ) 20 MG tablet Take 1 tablet (20 mg total) by mouth daily. (Patient taking differently: Take 10 mg by mouth daily.) 90 tablet 2   VITAMIN D  PO Take 1 capsule by mouth daily.     No current facility-administered medications for this visit.     Review of Systems    She denies chest pain, palpitations, pnd, orthopnea, n, v, dizziness, syncope, edema, weight gain, or early satiety. All other systems reviewed and are otherwise negative except as noted above.   Physical Exam    VS:  BP 129/73   Pulse 68   Ht 5' 5 (1.651 m)   Wt 242 lb (109.8 kg)   SpO2 96%   BMI 40.27 kg/m  STOP-Bang Score:  7      GEN: Well nourished, well developed, in no acute distress. HEENT: normal. Neck: Supple, no JVD, carotid bruits, or masses. Cardiac: RRR, no murmurs, rubs, or gallops. No clubbing, cyanosis, edema.  Radials/DP/PT 2+ and equal bilaterally.  Respiratory:  Respirations regular and unlabored, clear to auscultation bilaterally. GI: Soft, nontender, nondistended, BS + x 4. MS: no deformity or atrophy. Skin: warm and dry, no rash. Neuro:  Strength and sensation are intact. Psych: Normal affect.  Accessory Clinical Findings    ECG personally reviewed by me today -    - no EKG in office today.    Lab Results  Component Value Date   WBC 5.9 01/26/2024   HGB 12.1 01/26/2024   HCT 38.9 01/26/2024   MCV 87.0 01/26/2024   PLT 211 01/26/2024   Lab Results  Component Value Date   CREATININE 0.75 01/26/2024   BUN 17 01/26/2024   NA 140 01/26/2024   K 4.0 01/26/2024   CL 111 01/26/2024   CO2 17 (L) 01/26/2024   Lab Results  Component Value Date   ALT 29 10/04/2023   AST 23 10/04/2023   ALKPHOS 101 08/11/2023   BILITOT 0.3  10/04/2023   Lab Results  Component Value Date   CHOL 156 01/21/2023   HDL 57 01/21/2023   LDLCALC 80 01/21/2023   TRIG 96 01/21/2023   CHOLHDL 2.7 01/21/2023    Lab Results  Component Value Date   HGBA1C 5.3 08/03/2023    Assessment & Plan    1. Chronic diastolic heart failure/shortness of breath: Echocardiogram in 01/2023 showed EF 60 to 65%, no RWMA, G1 DD, normal RV systolic function.  Recent coronary CT angiogram as below.  She does have ongoing mild dyspnea on exertion, somewhat improved with weight loss.  Euvolemic and well compensated on exam.  Through shared decision making, will update echocardiogram.  Continue torsemide .   2. Nonobstructive CAD: Coronary CT angiogram in 2021 in the setting of chest tightness, shortness of breath, revealed coronary calcium  score 117 (83rd percentile), minimal nonobstructive CAD.  Coronary CT angiogram in 11/2023 revealed coronary calcium  score of 269 (88 percentile), severe TPV, mixed nonobstructive CAD. She continues to note mild dyspnea on exertion, improved with weight loss. She denies chest pain.  Will  pursue echocardiogram as above.  Encouraged to increase activity as tolerated.  Continue aspirin , amlodipine .   3. Palpitations/PSVT: 14-day Zio patch in 2023 in the setting of palpitations showed 24 episodes of SVT, longest episode lasting 13 beats, no evidence of atrial fibrillation. Denies any recent palpitations.   4. Mild mitral valve regurgitation: Not appreciated on most recent echo.  Repeat echo pending as above.   5. Mild dilation of the ascending aorta: Noted on prior CT, not appreciated on most recent echo.  Repeat echo pending.   6. Hypertension: BP well controlled. Continue current antihypertensive regimen.    7. Hyperlipidemia: LDL was 80 in 01/2023, above goal.  She was started on low-dose Crestor .   Will update fasting lipids, LFTs.  If LDL remains elevated above goal, and pending liver enzymes (previously elevated), consider  need for referral to lipid clinic Pharm.D. for consideration of alternative lipid-lowering therapy.  Continue Crestor .   8. History of bilateral DVTs: S/p IVC filter.    9. OSA: Recently completed repeat sleep study, results pending.  Followed by pulmonology.   10.  Bilateral leg pain:  ABIs in 2017 and 07/2023 were normal.  Denies worsening symptoms.   11.  Disposition: Follow-up in 6 months, sooner if needed.       Damien JAYSON Braver, NP 02/23/2024, 1:45 PM

## 2024-02-29 ENCOUNTER — Encounter: Payer: Self-pay | Admitting: Nurse Practitioner

## 2024-03-01 ENCOUNTER — Ambulatory Visit: Admitting: Nurse Practitioner

## 2024-03-02 ENCOUNTER — Other Ambulatory Visit: Payer: Self-pay | Admitting: Family

## 2024-03-09 ENCOUNTER — Telehealth (HOSPITAL_COMMUNITY): Payer: Self-pay

## 2024-03-09 ENCOUNTER — Telehealth: Payer: Self-pay

## 2024-03-09 ENCOUNTER — Encounter (HOSPITAL_COMMUNITY): Payer: Self-pay

## 2024-03-09 NOTE — Telephone Encounter (Addendum)
 Copied from CRM [id number]. Topic: Clinical - Lab/Test Results >> Mar 09, 2024 11:30 AM Celestine FALCON wrote: Reason for CRM: Pt is requesting the results from her home sleep study completed on 12/12/2023 to be sent to her new pulmonologist provider Dr. Vicenta Thresa Lennert, MD Location: [facility] [place] Address: [phone] [place], [place] [address] Phone Number: [phone]   Sleep study has been faxed to [phone].

## 2024-03-09 NOTE — Telephone Encounter (Signed)
 Pt insurance is active and benefits verified through [facility] Medicare Co-pay $15, DED 0/0 met, out of pocket $3,900/$1,134.10 met, co-insurance 0%. no pre-authorization required, [employer] 03/09/2024@11 :20, REF# [id number]  No visit limit for pulmonary rehab

## 2024-03-09 NOTE — Telephone Encounter (Signed)
 Received Referral from [facility] for Pulmonary Rehab. Was not able to leave VM

## 2024-03-09 NOTE — Telephone Encounter (Signed)
Sent letter to patient via My chart.

## 2024-03-09 NOTE — Telephone Encounter (Signed)
 Pt returned phone call and is interested in the pulmonary rehab program. Will pass pt ppw to nurse navigator.

## 2024-03-09 NOTE — Telephone Encounter (Signed)
 Patient returned call, confirmed interest in pulmonary rehab. Will pass to nurse for review.

## 2024-03-13 LAB — LIPID PANEL
Chol/HDL Ratio: 1.8 ratio (ref 0.0–4.4)
Cholesterol, Total: 163 mg/dL (ref 100–199)
HDL: 91 mg/dL (ref >39)
LDL Chol Calc (NIH): 59 mg/dL (ref 0–99)
Triglycerides: 65 mg/dL (ref 0–149)
VLDL Cholesterol Cal: 13 mg/dL (ref 5–40)

## 2024-03-13 LAB — HEPATIC FUNCTION PANEL
ALT: 134 IU/L — ABNORMAL HIGH (ref 0–32)
AST: 77 IU/L — ABNORMAL HIGH (ref 0–40)
Albumin: 3.9 g/dL (ref 3.9–4.9)
Alkaline Phosphatase: 159 IU/L — ABNORMAL HIGH (ref 49–135)
Bilirubin Total: 0.3 mg/dL (ref 0.0–1.2)
Bilirubin, Direct: 0.15 mg/dL (ref 0.00–0.40)
Total Protein: 6.6 g/dL (ref 6.0–8.5)

## 2024-03-14 ENCOUNTER — Telehealth (HOSPITAL_COMMUNITY): Payer: Self-pay

## 2024-03-14 NOTE — Telephone Encounter (Signed)
 [name]

## 2024-03-14 NOTE — Telephone Encounter (Signed)
 Called pt to schedule for Pulm Rehab. Informed pt of $15/session co-pay. Pt stated she could not financially commit at this time. Informed her she could call billing to see if there was a payment plan they could provide her. Pt stated she will call billing and will call us  back when she is ready to schedule.

## 2024-03-19 ENCOUNTER — Ambulatory Visit: Payer: Self-pay | Admitting: Nurse Practitioner

## 2024-03-19 DIAGNOSIS — Z79899 Other long term (current) drug therapy: Secondary | ICD-10-CM

## 2024-03-19 DIAGNOSIS — E785 Hyperlipidemia, unspecified: Secondary | ICD-10-CM

## 2024-03-19 DIAGNOSIS — I2510 Atherosclerotic heart disease of native coronary artery without angina pectoris: Secondary | ICD-10-CM

## 2024-03-21 NOTE — Telephone Encounter (Signed)
 Pt returning call

## 2024-04-06 ENCOUNTER — Ambulatory Visit (HOSPITAL_COMMUNITY)
Admission: RE | Admit: 2024-04-06 | Discharge: 2024-04-06 | Disposition: A | Discharge status: Home / Self Care | Source: Ambulatory Visit | Attending: Nurse Practitioner | Admitting: Nurse Practitioner

## 2024-04-06 DIAGNOSIS — R0602 Shortness of breath: Secondary | ICD-10-CM

## 2024-04-06 LAB — ECHOCARDIOGRAM COMPLETE
Area-P 1/2: 3.39 cm2
S' Lateral: 3.2 cm

## 2024-05-30 ENCOUNTER — Ambulatory Visit: Admitting: Pharmacist
